# Patient Record
Sex: Female | Born: 1955 | Race: White | Hispanic: No | Marital: Married | Smoking: Never smoker
Health system: Southern US, Community
[De-identification: ages and names within clinical notes are randomized; demographics above are authoritative.]

## PROBLEM LIST (undated history)

## (undated) DIAGNOSIS — D649 Anemia, unspecified: Secondary | ICD-10-CM

## (undated) DIAGNOSIS — J069 Acute upper respiratory infection, unspecified: Secondary | ICD-10-CM

## (undated) DIAGNOSIS — M542 Cervicalgia: Secondary | ICD-10-CM

## (undated) DIAGNOSIS — I739 Peripheral vascular disease, unspecified: Secondary | ICD-10-CM

## (undated) DIAGNOSIS — K5790 Diverticulosis of intestine, part unspecified, without perforation or abscess without bleeding: Secondary | ICD-10-CM

## (undated) DIAGNOSIS — R000 Tachycardia, unspecified: Secondary | ICD-10-CM

## (undated) DIAGNOSIS — Z950 Presence of cardiac pacemaker: Secondary | ICD-10-CM

## (undated) DIAGNOSIS — Z9889 Other specified postprocedural states: Secondary | ICD-10-CM

## (undated) DIAGNOSIS — I498 Other specified cardiac arrhythmias: Secondary | ICD-10-CM

## (undated) DIAGNOSIS — I10 Essential (primary) hypertension: Secondary | ICD-10-CM

## (undated) DIAGNOSIS — T8859XA Other complications of anesthesia, initial encounter: Secondary | ICD-10-CM

## (undated) DIAGNOSIS — K859 Acute pancreatitis without necrosis or infection, unspecified: Secondary | ICD-10-CM

## (undated) DIAGNOSIS — I4891 Unspecified atrial fibrillation: Secondary | ICD-10-CM

## (undated) DIAGNOSIS — I82409 Acute embolism and thrombosis of unspecified deep veins of unspecified lower extremity: Secondary | ICD-10-CM

## (undated) DIAGNOSIS — G909 Disorder of the autonomic nervous system, unspecified: Secondary | ICD-10-CM

## (undated) DIAGNOSIS — R209 Unspecified disturbances of skin sensation: Secondary | ICD-10-CM

## (undated) DIAGNOSIS — I4719 Other supraventricular tachycardia: Secondary | ICD-10-CM

## (undated) DIAGNOSIS — Q078 Other specified congenital malformations of nervous system: Secondary | ICD-10-CM

## (undated) DIAGNOSIS — I639 Cerebral infarction, unspecified: Secondary | ICD-10-CM

## (undated) DIAGNOSIS — R748 Abnormal levels of other serum enzymes: Secondary | ICD-10-CM

## (undated) DIAGNOSIS — R0600 Dyspnea, unspecified: Secondary | ICD-10-CM

## (undated) DIAGNOSIS — C787 Secondary malignant neoplasm of liver and intrahepatic bile duct: Secondary | ICD-10-CM

## (undated) DIAGNOSIS — K219 Gastro-esophageal reflux disease without esophagitis: Secondary | ICD-10-CM

## (undated) DIAGNOSIS — K760 Fatty (change of) liver, not elsewhere classified: Secondary | ICD-10-CM

## (undated) DIAGNOSIS — J189 Pneumonia, unspecified organism: Secondary | ICD-10-CM

## (undated) DIAGNOSIS — E119 Type 2 diabetes mellitus without complications: Secondary | ICD-10-CM

## (undated) DIAGNOSIS — J45909 Unspecified asthma, uncomplicated: Secondary | ICD-10-CM

## (undated) DIAGNOSIS — M1990 Unspecified osteoarthritis, unspecified site: Secondary | ICD-10-CM

## (undated) DIAGNOSIS — E785 Hyperlipidemia, unspecified: Secondary | ICD-10-CM

## (undated) DIAGNOSIS — M949 Disorder of cartilage, unspecified: Secondary | ICD-10-CM

## (undated) DIAGNOSIS — L309 Dermatitis, unspecified: Secondary | ICD-10-CM

## (undated) DIAGNOSIS — S4490XA Injury of unspecified nerve at shoulder and upper arm level, unspecified arm, initial encounter: Secondary | ICD-10-CM

## (undated) DIAGNOSIS — Z8489 Family history of other specified conditions: Secondary | ICD-10-CM

## (undated) DIAGNOSIS — R112 Nausea with vomiting, unspecified: Secondary | ICD-10-CM

## (undated) DIAGNOSIS — I471 Supraventricular tachycardia: Secondary | ICD-10-CM

## (undated) DIAGNOSIS — T783XXA Angioneurotic edema, initial encounter: Secondary | ICD-10-CM

## (undated) DIAGNOSIS — C7800 Secondary malignant neoplasm of unspecified lung: Secondary | ICD-10-CM

## (undated) DIAGNOSIS — M7989 Other specified soft tissue disorders: Secondary | ICD-10-CM

## (undated) DIAGNOSIS — G90A Postural orthostatic tachycardia syndrome (POTS): Secondary | ICD-10-CM

## (undated) DIAGNOSIS — Z923 Personal history of irradiation: Secondary | ICD-10-CM

## (undated) DIAGNOSIS — C7931 Secondary malignant neoplasm of brain: Secondary | ICD-10-CM

## (undated) DIAGNOSIS — T4145XA Adverse effect of unspecified anesthetic, initial encounter: Secondary | ICD-10-CM

## (undated) DIAGNOSIS — L509 Urticaria, unspecified: Secondary | ICD-10-CM

## (undated) DIAGNOSIS — F419 Anxiety disorder, unspecified: Secondary | ICD-10-CM

## (undated) DIAGNOSIS — Z8719 Personal history of other diseases of the digestive system: Secondary | ICD-10-CM

## (undated) DIAGNOSIS — M899 Disorder of bone, unspecified: Secondary | ICD-10-CM

## (undated) DIAGNOSIS — I951 Orthostatic hypotension: Secondary | ICD-10-CM

## (undated) DIAGNOSIS — M8580 Other specified disorders of bone density and structure, unspecified site: Secondary | ICD-10-CM

## (undated) DIAGNOSIS — E162 Hypoglycemia, unspecified: Secondary | ICD-10-CM

## (undated) DIAGNOSIS — I7300 Raynaud's syndrome without gangrene: Secondary | ICD-10-CM

## (undated) DIAGNOSIS — C4490 Unspecified malignant neoplasm of skin, unspecified: Secondary | ICD-10-CM

## (undated) HISTORY — DX: Other supraventricular tachycardia: I47.19

## (undated) HISTORY — PX: CARPAL TUNNEL RELEASE: SHX101

## (undated) HISTORY — DX: Presence of cardiac pacemaker: Z95.0

## (undated) HISTORY — DX: Dermatitis, unspecified: L30.9

## (undated) HISTORY — PX: TONSILLECTOMY AND ADENOIDECTOMY: SUR1326

## (undated) HISTORY — DX: Orthostatic hypotension: I95.1

## (undated) HISTORY — DX: Other specified soft tissue disorders: M79.89

## (undated) HISTORY — DX: Hypoglycemia, unspecified: E16.2

## (undated) HISTORY — DX: Acute embolism and thrombosis of unspecified deep veins of unspecified lower extremity: I82.409

## (undated) HISTORY — DX: Cervicalgia: M54.2

## (undated) HISTORY — DX: Urticaria, unspecified: L50.9

## (undated) HISTORY — DX: Cerebral infarction, unspecified: I63.9

## (undated) HISTORY — DX: Unspecified atrial fibrillation: I48.91

## (undated) HISTORY — DX: Type 2 diabetes mellitus without complications: E11.9

## (undated) HISTORY — PX: MELANOMA EXCISION: SHX5266

## (undated) HISTORY — DX: Unspecified disturbances of skin sensation: R20.9

## (undated) HISTORY — DX: Hyperlipidemia, unspecified: E78.5

## (undated) HISTORY — DX: Abnormal levels of other serum enzymes: R74.8

## (undated) HISTORY — PX: DEEP AXILLARY SENTINEL NODE BIOPSY / EXCISION: SUR130

## (undated) HISTORY — DX: Unspecified malignant neoplasm of skin, unspecified: C44.90

## (undated) HISTORY — DX: Angioneurotic edema, initial encounter: T78.3XXA

## (undated) HISTORY — DX: Disorder of bone, unspecified: M89.9

## (undated) HISTORY — DX: Injury of unspecified nerve at shoulder and upper arm level, unspecified arm, initial encounter: S44.90XA

## (undated) HISTORY — DX: Other specified cardiac arrhythmias: I49.8

## (undated) HISTORY — DX: Other specified disorders of bone density and structure, unspecified site: M85.80

## (undated) HISTORY — DX: Acute upper respiratory infection, unspecified: J06.9

## (undated) HISTORY — DX: Diverticulosis of intestine, part unspecified, without perforation or abscess without bleeding: K57.90

## (undated) HISTORY — DX: Essential (primary) hypertension: I10

## (undated) HISTORY — DX: Supraventricular tachycardia: I47.1

## (undated) HISTORY — PX: SINOSCOPY: SHX187

## (undated) HISTORY — DX: Postural orthostatic tachycardia syndrome (POTS): G90.A

## (undated) HISTORY — PX: ADENOIDECTOMY: SUR15

## (undated) HISTORY — PX: PACEMAKER IMPLANT: EP1218

## (undated) HISTORY — DX: Other specified congenital malformations of nervous system: Q07.8

## (undated) HISTORY — PX: OTHER SURGICAL HISTORY: SHX169

## (undated) HISTORY — DX: Tachycardia, unspecified: R00.0

## (undated) HISTORY — DX: Acute pancreatitis without necrosis or infection, unspecified: K85.90

## (undated) HISTORY — DX: Disorder of cartilage, unspecified: M94.9

## (undated) HISTORY — PX: NOSE SURGERY: SHX723

## (undated) HISTORY — PX: ABLATION SAPHENOUS VEIN W/ RFA: SUR11

---

## 1997-05-27 ENCOUNTER — Encounter: Admission: RE | Admit: 1997-05-27 | Discharge: 1997-08-25 | Payer: Self-pay | Admitting: *Deleted

## 1997-09-25 ENCOUNTER — Ambulatory Visit (HOSPITAL_COMMUNITY): Admission: RE | Admit: 1997-09-25 | Discharge: 1997-09-25 | Payer: Self-pay | Admitting: *Deleted

## 1997-11-26 ENCOUNTER — Other Ambulatory Visit: Admission: RE | Admit: 1997-11-26 | Discharge: 1997-11-26 | Payer: Self-pay | Admitting: Obstetrics and Gynecology

## 1997-12-08 ENCOUNTER — Inpatient Hospital Stay (HOSPITAL_COMMUNITY): Admission: RE | Admit: 1997-12-08 | Discharge: 1997-12-09 | Payer: Self-pay | Admitting: Internal Medicine

## 1998-02-06 ENCOUNTER — Inpatient Hospital Stay (HOSPITAL_COMMUNITY): Admission: EM | Admit: 1998-02-06 | Discharge: 1998-02-07 | Payer: Self-pay | Admitting: Emergency Medicine

## 1998-02-06 ENCOUNTER — Encounter: Payer: Self-pay | Admitting: Emergency Medicine

## 1998-02-26 HISTORY — PX: CARPAL TUNNEL RELEASE: SHX101

## 1998-02-26 HISTORY — PX: OTHER SURGICAL HISTORY: SHX169

## 1998-05-31 ENCOUNTER — Encounter (HOSPITAL_COMMUNITY): Admission: RE | Admit: 1998-05-31 | Discharge: 1998-08-29 | Payer: Self-pay | Admitting: Internal Medicine

## 1998-12-27 ENCOUNTER — Encounter: Payer: Self-pay | Admitting: Internal Medicine

## 1998-12-27 ENCOUNTER — Ambulatory Visit (HOSPITAL_COMMUNITY): Admission: RE | Admit: 1998-12-27 | Discharge: 1998-12-27 | Payer: Self-pay | Admitting: *Deleted

## 1999-03-14 ENCOUNTER — Other Ambulatory Visit: Admission: RE | Admit: 1999-03-14 | Discharge: 1999-03-14 | Payer: Self-pay | Admitting: Obstetrics and Gynecology

## 1999-09-02 ENCOUNTER — Emergency Department (HOSPITAL_COMMUNITY): Admission: EM | Admit: 1999-09-02 | Discharge: 1999-09-02 | Payer: Self-pay | Admitting: Emergency Medicine

## 2000-01-01 ENCOUNTER — Encounter (HOSPITAL_COMMUNITY): Admission: RE | Admit: 2000-01-01 | Discharge: 2000-01-01 | Payer: Self-pay | Admitting: Internal Medicine

## 2000-08-20 ENCOUNTER — Ambulatory Visit (HOSPITAL_COMMUNITY): Admission: RE | Admit: 2000-08-20 | Discharge: 2000-08-20 | Payer: Self-pay | Admitting: Internal Medicine

## 2000-08-20 ENCOUNTER — Encounter: Payer: Self-pay | Admitting: Internal Medicine

## 2001-04-22 ENCOUNTER — Ambulatory Visit (HOSPITAL_COMMUNITY): Admission: RE | Admit: 2001-04-22 | Discharge: 2001-04-22 | Payer: Self-pay | Admitting: Internal Medicine

## 2001-09-06 ENCOUNTER — Emergency Department (HOSPITAL_COMMUNITY): Admission: EM | Admit: 2001-09-06 | Discharge: 2001-09-06 | Payer: Self-pay | Admitting: Emergency Medicine

## 2001-12-09 ENCOUNTER — Inpatient Hospital Stay (HOSPITAL_COMMUNITY): Admission: AD | Admit: 2001-12-09 | Discharge: 2001-12-09 | Payer: Self-pay | Admitting: Internal Medicine

## 2002-02-20 ENCOUNTER — Other Ambulatory Visit: Admission: RE | Admit: 2002-02-20 | Discharge: 2002-02-20 | Payer: Self-pay | Admitting: Obstetrics and Gynecology

## 2002-05-06 ENCOUNTER — Ambulatory Visit (HOSPITAL_COMMUNITY): Admission: RE | Admit: 2002-05-06 | Discharge: 2002-05-06 | Payer: Self-pay | Admitting: Internal Medicine

## 2002-05-06 ENCOUNTER — Encounter: Payer: Self-pay | Admitting: Internal Medicine

## 2002-05-25 ENCOUNTER — Encounter: Payer: Self-pay | Admitting: Internal Medicine

## 2002-05-28 ENCOUNTER — Emergency Department (HOSPITAL_COMMUNITY): Admission: EM | Admit: 2002-05-28 | Discharge: 2002-05-28 | Payer: Self-pay | Admitting: Emergency Medicine

## 2002-12-04 ENCOUNTER — Emergency Department (HOSPITAL_COMMUNITY): Admission: EM | Admit: 2002-12-04 | Discharge: 2002-12-04 | Payer: Self-pay | Admitting: Emergency Medicine

## 2002-12-04 ENCOUNTER — Encounter: Payer: Self-pay | Admitting: Emergency Medicine

## 2003-01-05 ENCOUNTER — Ambulatory Visit (HOSPITAL_COMMUNITY): Admission: RE | Admit: 2003-01-05 | Discharge: 2003-01-05 | Payer: Self-pay | Admitting: *Deleted

## 2003-04-08 ENCOUNTER — Ambulatory Visit (HOSPITAL_COMMUNITY): Admission: RE | Admit: 2003-04-08 | Discharge: 2003-04-08 | Payer: Self-pay | Admitting: Internal Medicine

## 2003-05-04 ENCOUNTER — Ambulatory Visit (HOSPITAL_COMMUNITY): Admission: RE | Admit: 2003-05-04 | Discharge: 2003-05-04 | Payer: Self-pay | Admitting: Internal Medicine

## 2003-05-10 ENCOUNTER — Ambulatory Visit (HOSPITAL_COMMUNITY): Admission: RE | Admit: 2003-05-10 | Discharge: 2003-05-10 | Payer: Self-pay | Admitting: Internal Medicine

## 2003-11-09 ENCOUNTER — Ambulatory Visit (HOSPITAL_COMMUNITY): Admission: RE | Admit: 2003-11-09 | Discharge: 2003-11-09 | Payer: Self-pay | Admitting: Internal Medicine

## 2003-12-14 ENCOUNTER — Ambulatory Visit: Payer: Self-pay | Admitting: *Deleted

## 2004-02-04 ENCOUNTER — Ambulatory Visit: Payer: Self-pay | Admitting: Internal Medicine

## 2004-03-10 ENCOUNTER — Ambulatory Visit: Payer: Self-pay

## 2004-03-22 ENCOUNTER — Ambulatory Visit: Payer: Self-pay | Admitting: Internal Medicine

## 2004-03-26 ENCOUNTER — Emergency Department (HOSPITAL_COMMUNITY): Admission: EM | Admit: 2004-03-26 | Discharge: 2004-03-26 | Payer: Self-pay | Admitting: Emergency Medicine

## 2004-03-27 ENCOUNTER — Ambulatory Visit: Payer: Self-pay | Admitting: Internal Medicine

## 2004-05-02 ENCOUNTER — Ambulatory Visit: Payer: Self-pay | Admitting: Internal Medicine

## 2004-05-16 ENCOUNTER — Ambulatory Visit: Payer: Self-pay | Admitting: Internal Medicine

## 2004-06-14 ENCOUNTER — Ambulatory Visit: Payer: Self-pay | Admitting: Internal Medicine

## 2004-06-29 ENCOUNTER — Ambulatory Visit: Payer: Self-pay | Admitting: Internal Medicine

## 2004-07-12 ENCOUNTER — Ambulatory Visit: Payer: Self-pay

## 2004-07-27 ENCOUNTER — Ambulatory Visit: Payer: Self-pay | Admitting: Internal Medicine

## 2004-07-28 ENCOUNTER — Ambulatory Visit: Payer: Self-pay

## 2004-08-03 ENCOUNTER — Ambulatory Visit: Payer: Self-pay | Admitting: Cardiology

## 2004-08-10 ENCOUNTER — Ambulatory Visit: Payer: Self-pay | Admitting: Internal Medicine

## 2004-08-17 ENCOUNTER — Ambulatory Visit: Payer: Self-pay | Admitting: Cardiology

## 2004-08-24 ENCOUNTER — Ambulatory Visit: Payer: Self-pay | Admitting: Cardiology

## 2004-09-01 ENCOUNTER — Ambulatory Visit: Payer: Self-pay | Admitting: Cardiology

## 2004-09-08 ENCOUNTER — Ambulatory Visit: Payer: Self-pay | Admitting: Cardiology

## 2004-09-15 ENCOUNTER — Ambulatory Visit: Payer: Self-pay | Admitting: Cardiology

## 2004-09-22 ENCOUNTER — Ambulatory Visit: Payer: Self-pay

## 2004-09-22 ENCOUNTER — Ambulatory Visit: Payer: Self-pay | Admitting: Cardiology

## 2004-10-06 ENCOUNTER — Ambulatory Visit: Payer: Self-pay | Admitting: Cardiology

## 2004-10-13 ENCOUNTER — Ambulatory Visit: Payer: Self-pay | Admitting: Internal Medicine

## 2004-10-27 ENCOUNTER — Ambulatory Visit: Payer: Self-pay | Admitting: Cardiology

## 2004-10-27 ENCOUNTER — Ambulatory Visit: Payer: Self-pay | Admitting: Internal Medicine

## 2004-11-10 ENCOUNTER — Ambulatory Visit: Payer: Self-pay | Admitting: Cardiology

## 2004-11-15 ENCOUNTER — Ambulatory Visit: Payer: Self-pay | Admitting: Cardiology

## 2004-11-24 ENCOUNTER — Ambulatory Visit: Payer: Self-pay | Admitting: Cardiology

## 2004-12-22 ENCOUNTER — Ambulatory Visit: Payer: Self-pay | Admitting: Cardiology

## 2004-12-28 ENCOUNTER — Ambulatory Visit: Payer: Self-pay | Admitting: Cardiology

## 2005-01-17 ENCOUNTER — Ambulatory Visit: Payer: Self-pay | Admitting: Internal Medicine

## 2005-01-25 ENCOUNTER — Ambulatory Visit: Payer: Self-pay | Admitting: Internal Medicine

## 2005-02-01 ENCOUNTER — Ambulatory Visit: Payer: Self-pay | Admitting: Gastroenterology

## 2005-02-13 ENCOUNTER — Ambulatory Visit (HOSPITAL_COMMUNITY): Admission: RE | Admit: 2005-02-13 | Discharge: 2005-02-13 | Payer: Self-pay | Admitting: Gastroenterology

## 2005-02-13 ENCOUNTER — Encounter (INDEPENDENT_AMBULATORY_CARE_PROVIDER_SITE_OTHER): Payer: Self-pay | Admitting: *Deleted

## 2005-02-28 ENCOUNTER — Ambulatory Visit: Payer: Self-pay | Admitting: Gastroenterology

## 2005-03-09 ENCOUNTER — Ambulatory Visit: Payer: Self-pay

## 2005-07-16 ENCOUNTER — Ambulatory Visit: Payer: Self-pay | Admitting: Internal Medicine

## 2005-08-23 ENCOUNTER — Ambulatory Visit: Payer: Self-pay

## 2005-10-31 ENCOUNTER — Ambulatory Visit: Payer: Self-pay

## 2005-12-05 ENCOUNTER — Ambulatory Visit: Payer: Self-pay | Admitting: Internal Medicine

## 2005-12-05 LAB — CONVERTED CEMR LAB
ALT: 29 units/L (ref 0–40)
AST: 22 units/L (ref 0–37)
Albumin: 4 g/dL (ref 3.5–5.2)
Alkaline Phosphatase: 116 units/L (ref 39–117)
BUN: 7 mg/dL (ref 6–23)
Basophils Absolute: 0 10*3/uL (ref 0.0–0.1)
Basophils Relative: 0.4 % (ref 0.0–1.0)
Bilirubin, Direct: 0.1 mg/dL (ref 0.0–0.3)
CO2: 29 meq/L (ref 19–32)
Calcium: 9.7 mg/dL (ref 8.4–10.5)
Chloride: 107 meq/L (ref 96–112)
Creatinine, Ser: 0.8 mg/dL (ref 0.4–1.2)
Eosinophil percent: 0.6 % (ref 0.0–5.0)
GFR calc non Af Amer: 82 mL/min
Glomerular Filtration Rate, Af Am: 99 mL/min/{1.73_m2}
Glucose, Bld: 123 mg/dL — ABNORMAL HIGH (ref 70–99)
HCT: 40.5 % (ref 36.0–46.0)
Hemoglobin: 13.5 g/dL (ref 12.0–15.0)
Lymphocytes Relative: 14.2 % (ref 12.0–46.0)
MCHC: 33.4 g/dL (ref 30.0–36.0)
MCV: 88.3 fL (ref 78.0–100.0)
Monocytes Absolute: 0.7 10*3/uL (ref 0.2–0.7)
Monocytes Relative: 8.7 % (ref 3.0–11.0)
Neutro Abs: 6.4 10*3/uL (ref 1.4–7.7)
Neutrophils Relative %: 76.1 % (ref 43.0–77.0)
Platelets: 276 10*3/uL (ref 150–400)
Potassium: 4.9 meq/L (ref 3.5–5.1)
RBC: 4.58 M/uL (ref 3.87–5.11)
RDW: 11.7 % (ref 11.5–14.6)
Sodium: 141 meq/L (ref 135–145)
Total Bilirubin: 0.6 mg/dL (ref 0.3–1.2)
Total Protein: 6.8 g/dL (ref 6.0–8.3)
WBC: 8.3 10*3/uL (ref 4.5–10.5)

## 2005-12-14 ENCOUNTER — Encounter (INDEPENDENT_AMBULATORY_CARE_PROVIDER_SITE_OTHER): Payer: Self-pay | Admitting: *Deleted

## 2005-12-14 ENCOUNTER — Ambulatory Visit (HOSPITAL_COMMUNITY): Admission: RE | Admit: 2005-12-14 | Discharge: 2005-12-14 | Payer: Self-pay | Admitting: Internal Medicine

## 2005-12-18 ENCOUNTER — Ambulatory Visit: Payer: Self-pay | Admitting: Internal Medicine

## 2005-12-25 ENCOUNTER — Ambulatory Visit: Payer: Self-pay | Admitting: Internal Medicine

## 2006-01-02 ENCOUNTER — Ambulatory Visit: Payer: Self-pay | Admitting: Internal Medicine

## 2006-01-02 ENCOUNTER — Ambulatory Visit: Payer: Self-pay

## 2006-01-03 ENCOUNTER — Ambulatory Visit: Payer: Self-pay | Admitting: Internal Medicine

## 2006-01-03 LAB — CONVERTED CEMR LAB
Glucose, Bld: 84 mg/dL (ref 70–99)
Hgb A1c MFr Bld: 5.5 % (ref 4.6–6.0)

## 2006-01-09 ENCOUNTER — Ambulatory Visit: Payer: Self-pay | Admitting: Internal Medicine

## 2006-05-09 ENCOUNTER — Ambulatory Visit: Payer: Self-pay | Admitting: Internal Medicine

## 2006-06-10 ENCOUNTER — Ambulatory Visit: Payer: Self-pay | Admitting: Internal Medicine

## 2006-07-29 ENCOUNTER — Ambulatory Visit: Payer: Self-pay | Admitting: Internal Medicine

## 2006-08-12 ENCOUNTER — Inpatient Hospital Stay (HOSPITAL_COMMUNITY): Admission: AD | Admit: 2006-08-12 | Discharge: 2006-08-13 | Payer: Self-pay | Admitting: Internal Medicine

## 2006-08-13 ENCOUNTER — Encounter: Payer: Self-pay | Admitting: Internal Medicine

## 2006-08-19 ENCOUNTER — Ambulatory Visit: Payer: Self-pay | Admitting: Internal Medicine

## 2006-08-19 ENCOUNTER — Encounter (INDEPENDENT_AMBULATORY_CARE_PROVIDER_SITE_OTHER): Payer: Self-pay | Admitting: Surgery

## 2006-08-19 ENCOUNTER — Ambulatory Visit (HOSPITAL_COMMUNITY): Admission: RE | Admit: 2006-08-19 | Discharge: 2006-08-19 | Payer: Self-pay | Admitting: Surgery

## 2006-08-26 ENCOUNTER — Encounter (INDEPENDENT_AMBULATORY_CARE_PROVIDER_SITE_OTHER): Payer: Self-pay | Admitting: *Deleted

## 2006-11-08 ENCOUNTER — Encounter: Admission: RE | Admit: 2006-11-08 | Discharge: 2006-11-08 | Payer: Self-pay | Admitting: *Deleted

## 2006-11-08 ENCOUNTER — Encounter (INDEPENDENT_AMBULATORY_CARE_PROVIDER_SITE_OTHER): Payer: Self-pay | Admitting: *Deleted

## 2006-11-08 ENCOUNTER — Ambulatory Visit: Payer: Self-pay | Admitting: Internal Medicine

## 2006-11-26 ENCOUNTER — Ambulatory Visit: Payer: Self-pay | Admitting: Internal Medicine

## 2006-11-26 LAB — CONVERTED CEMR LAB
BUN: 12 mg/dL (ref 6–23)
Basophils Absolute: 0 10*3/uL (ref 0.0–0.1)
Basophils Relative: 1 % (ref 0.0–1.0)
CO2: 30 meq/L (ref 19–32)
Calcium: 9.4 mg/dL (ref 8.4–10.5)
Chloride: 103 meq/L (ref 96–112)
Cholesterol: 212 mg/dL (ref 0–200)
Creatinine, Ser: 0.7 mg/dL (ref 0.4–1.2)
Direct LDL: 138.4 mg/dL
Eosinophils Absolute: 0.1 10*3/uL (ref 0.0–0.6)
Eosinophils Relative: 2.3 % (ref 0.0–5.0)
GFR calc Af Amer: 115 mL/min
GFR calc non Af Amer: 95 mL/min
Glucose, Bld: 94 mg/dL (ref 70–99)
HCT: 38.2 % (ref 36.0–46.0)
HDL: 52.8 mg/dL (ref >39.0)
Hemoglobin: 13.3 g/dL (ref 12.0–15.0)
Lymphocytes Relative: 39.1 % (ref 12.0–46.0)
MCHC: 34.9 g/dL (ref 30.0–36.0)
MCV: 87.9 fL (ref 78.0–100.0)
Monocytes Absolute: 0.6 10*3/uL (ref 0.2–0.7)
Monocytes Relative: 13.2 % — ABNORMAL HIGH (ref 3.0–11.0)
Neutro Abs: 2.2 10*3/uL (ref 1.4–7.7)
Neutrophils Relative %: 44.4 % (ref 43.0–77.0)
Platelets: 271 10*3/uL (ref 150–400)
Potassium: 4.1 meq/L (ref 3.5–5.1)
RBC: 4.35 M/uL (ref 3.87–5.11)
RDW: 11.6 % (ref 11.5–14.6)
Sodium: 140 meq/L (ref 135–145)
Total CHOL/HDL Ratio: 4
Triglycerides: 110 mg/dL (ref 0–149)
VLDL: 22 mg/dL (ref 0–40)
WBC: 4.7 10*3/uL (ref 4.5–10.5)

## 2007-01-05 ENCOUNTER — Emergency Department (HOSPITAL_COMMUNITY): Admission: EM | Admit: 2007-01-05 | Discharge: 2007-01-05 | Payer: Self-pay | Admitting: Emergency Medicine

## 2007-01-06 ENCOUNTER — Encounter: Payer: Self-pay | Admitting: Internal Medicine

## 2007-01-09 ENCOUNTER — Ambulatory Visit (HOSPITAL_COMMUNITY): Admission: RE | Admit: 2007-01-09 | Discharge: 2007-01-09 | Payer: Self-pay | Admitting: Otolaryngology

## 2007-02-05 ENCOUNTER — Ambulatory Visit: Payer: Self-pay | Admitting: Internal Medicine

## 2007-03-10 ENCOUNTER — Ambulatory Visit: Payer: Self-pay

## 2007-04-11 ENCOUNTER — Ambulatory Visit: Payer: Self-pay | Admitting: Internal Medicine

## 2007-05-01 ENCOUNTER — Ambulatory Visit: Payer: Self-pay | Admitting: Internal Medicine

## 2007-05-01 LAB — CONVERTED CEMR LAB
ALT: 21 units/L (ref 0–35)
AST: 28 units/L (ref 0–37)
BUN: 12 mg/dL (ref 6–23)
CO2: 26 meq/L (ref 19–32)
Calcium: 9.3 mg/dL (ref 8.4–10.5)
Chloride: 103 meq/L (ref 96–112)
Cholesterol: 177 mg/dL (ref 0–200)
Creatinine, Ser: 0.6 mg/dL (ref 0.4–1.2)
GFR calc Af Amer: 137 mL/min
GFR calc non Af Amer: 113 mL/min
Glucose, Bld: 92 mg/dL (ref 70–99)
HDL: 56 mg/dL (ref >39.0)
LDL Cholesterol: 99 mg/dL (ref 0–99)
Potassium: 4.5 meq/L (ref 3.5–5.1)
Sodium: 138 meq/L (ref 135–145)
TSH: 0.82 microintl units/mL (ref 0.35–5.50)
Total CHOL/HDL Ratio: 3.2
Triglycerides: 109 mg/dL (ref 0–149)
VLDL: 22 mg/dL (ref 0–40)

## 2007-06-10 ENCOUNTER — Encounter: Payer: Self-pay | Admitting: Internal Medicine

## 2007-06-20 ENCOUNTER — Ambulatory Visit (HOSPITAL_COMMUNITY): Admission: RE | Admit: 2007-06-20 | Discharge: 2007-06-20 | Payer: Self-pay | Admitting: Otolaryngology

## 2007-06-25 ENCOUNTER — Ambulatory Visit: Payer: Self-pay | Admitting: Internal Medicine

## 2007-06-25 ENCOUNTER — Ambulatory Visit: Payer: Self-pay

## 2007-06-25 DIAGNOSIS — M79609 Pain in unspecified limb: Secondary | ICD-10-CM | POA: Insufficient documentation

## 2007-06-25 DIAGNOSIS — M949 Disorder of cartilage, unspecified: Secondary | ICD-10-CM

## 2007-06-25 DIAGNOSIS — M899 Disorder of bone, unspecified: Secondary | ICD-10-CM | POA: Insufficient documentation

## 2007-06-25 DIAGNOSIS — M7989 Other specified soft tissue disorders: Secondary | ICD-10-CM | POA: Insufficient documentation

## 2007-07-14 ENCOUNTER — Ambulatory Visit (HOSPITAL_COMMUNITY): Admission: RE | Admit: 2007-07-14 | Discharge: 2007-07-14 | Payer: Self-pay | Admitting: Otolaryngology

## 2007-09-04 ENCOUNTER — Ambulatory Visit: Payer: Self-pay

## 2007-09-17 ENCOUNTER — Emergency Department (HOSPITAL_COMMUNITY): Admission: EM | Admit: 2007-09-17 | Discharge: 2007-09-17 | Payer: Self-pay | Admitting: Emergency Medicine

## 2007-09-17 ENCOUNTER — Telehealth: Payer: Self-pay | Admitting: Internal Medicine

## 2007-09-22 ENCOUNTER — Telehealth: Payer: Self-pay | Admitting: Internal Medicine

## 2007-09-26 ENCOUNTER — Ambulatory Visit: Payer: Self-pay | Admitting: Internal Medicine

## 2007-09-26 DIAGNOSIS — R748 Abnormal levels of other serum enzymes: Secondary | ICD-10-CM | POA: Insufficient documentation

## 2007-09-26 DIAGNOSIS — M542 Cervicalgia: Secondary | ICD-10-CM | POA: Insufficient documentation

## 2007-09-26 DIAGNOSIS — R209 Unspecified disturbances of skin sensation: Secondary | ICD-10-CM | POA: Insufficient documentation

## 2007-10-07 ENCOUNTER — Encounter: Payer: Self-pay | Admitting: Internal Medicine

## 2007-10-08 ENCOUNTER — Encounter: Payer: Self-pay | Admitting: Internal Medicine

## 2007-10-17 ENCOUNTER — Encounter: Payer: Self-pay | Admitting: Internal Medicine

## 2007-10-17 ENCOUNTER — Encounter: Admission: RE | Admit: 2007-10-17 | Discharge: 2007-10-17 | Payer: Self-pay | Admitting: Endocrinology

## 2007-10-30 ENCOUNTER — Ambulatory Visit: Payer: Self-pay | Admitting: Internal Medicine

## 2007-12-01 ENCOUNTER — Ambulatory Visit: Payer: Self-pay | Admitting: Internal Medicine

## 2007-12-03 ENCOUNTER — Telehealth: Payer: Self-pay | Admitting: Internal Medicine

## 2007-12-08 ENCOUNTER — Telehealth: Payer: Self-pay | Admitting: Internal Medicine

## 2007-12-08 ENCOUNTER — Ambulatory Visit: Payer: Self-pay | Admitting: Internal Medicine

## 2007-12-08 DIAGNOSIS — R1012 Left upper quadrant pain: Secondary | ICD-10-CM | POA: Insufficient documentation

## 2007-12-08 DIAGNOSIS — R1032 Left lower quadrant pain: Secondary | ICD-10-CM | POA: Insufficient documentation

## 2007-12-08 LAB — CONVERTED CEMR LAB
BUN: 9 mg/dL (ref 6–23)
Basophils Absolute: 0.1 10*3/uL (ref 0.0–0.1)
Basophils Relative: 0.5 % (ref 0.0–3.0)
CO2: 28 meq/L (ref 19–32)
Calcium: 9.5 mg/dL (ref 8.4–10.5)
Chloride: 105 meq/L (ref 96–112)
Creatinine, Ser: 0.6 mg/dL (ref 0.4–1.2)
Eosinophils Absolute: 0 10*3/uL (ref 0.0–0.7)
Eosinophils Relative: 0.3 % (ref 0.0–5.0)
GFR calc Af Amer: 137 mL/min
GFR calc non Af Amer: 113 mL/min
Glucose, Bld: 106 mg/dL — ABNORMAL HIGH (ref 70–99)
HCT: 40.3 % (ref 36.0–46.0)
Hemoglobin: 14.5 g/dL (ref 12.0–15.0)
Lymphocytes Relative: 10.7 % — ABNORMAL LOW (ref 12.0–46.0)
MCHC: 35.9 g/dL (ref 30.0–36.0)
MCV: 87.1 fL (ref 78.0–100.0)
Monocytes Absolute: 0.9 10*3/uL (ref 0.1–1.0)
Monocytes Relative: 7.8 % (ref 3.0–12.0)
Neutro Abs: 8.7 10*3/uL — ABNORMAL HIGH (ref 1.4–7.7)
Neutrophils Relative %: 80.7 % — ABNORMAL HIGH (ref 43.0–77.0)
Platelets: 264 10*3/uL (ref 150–400)
Potassium: 4 meq/L (ref 3.5–5.1)
RBC: 4.63 M/uL (ref 3.87–5.11)
RDW: 11.1 % — ABNORMAL LOW (ref 11.5–14.6)
Sodium: 142 meq/L (ref 135–145)
WBC: 10.9 10*3/uL — ABNORMAL HIGH (ref 4.5–10.5)

## 2007-12-10 ENCOUNTER — Telehealth: Payer: Self-pay | Admitting: Internal Medicine

## 2007-12-10 ENCOUNTER — Ambulatory Visit (HOSPITAL_COMMUNITY): Admission: RE | Admit: 2007-12-10 | Discharge: 2007-12-10 | Payer: Self-pay | Admitting: Internal Medicine

## 2007-12-15 ENCOUNTER — Telehealth: Payer: Self-pay | Admitting: Internal Medicine

## 2008-01-14 ENCOUNTER — Encounter: Payer: Self-pay | Admitting: Internal Medicine

## 2008-02-05 ENCOUNTER — Encounter: Payer: Self-pay | Admitting: Internal Medicine

## 2008-02-12 ENCOUNTER — Ambulatory Visit (HOSPITAL_COMMUNITY): Admission: RE | Admit: 2008-02-12 | Discharge: 2008-02-12 | Payer: Self-pay | Admitting: Surgery

## 2008-02-16 ENCOUNTER — Ambulatory Visit (HOSPITAL_COMMUNITY): Admission: RE | Admit: 2008-02-16 | Discharge: 2008-02-16 | Payer: Self-pay | Admitting: Surgery

## 2008-02-24 ENCOUNTER — Encounter: Admission: RE | Admit: 2008-02-24 | Discharge: 2008-02-24 | Payer: Self-pay | Admitting: Endocrinology

## 2008-03-01 ENCOUNTER — Ambulatory Visit: Payer: Self-pay | Admitting: Oncology

## 2008-04-22 ENCOUNTER — Encounter: Payer: Self-pay | Admitting: Internal Medicine

## 2008-05-04 ENCOUNTER — Encounter: Payer: Self-pay | Admitting: Internal Medicine

## 2008-05-11 ENCOUNTER — Ambulatory Visit (HOSPITAL_COMMUNITY): Admission: RE | Admit: 2008-05-11 | Discharge: 2008-05-11 | Payer: Self-pay | Admitting: Otolaryngology

## 2008-06-17 ENCOUNTER — Encounter (INDEPENDENT_AMBULATORY_CARE_PROVIDER_SITE_OTHER): Payer: Self-pay | Admitting: *Deleted

## 2008-07-09 ENCOUNTER — Encounter (INDEPENDENT_AMBULATORY_CARE_PROVIDER_SITE_OTHER): Payer: Self-pay | Admitting: *Deleted

## 2008-07-12 ENCOUNTER — Ambulatory Visit: Payer: Self-pay | Admitting: Internal Medicine

## 2008-07-12 DIAGNOSIS — Z85820 Personal history of malignant melanoma of skin: Secondary | ICD-10-CM | POA: Insufficient documentation

## 2008-07-12 DIAGNOSIS — S4490XA Injury of unspecified nerve at shoulder and upper arm level, unspecified arm, initial encounter: Secondary | ICD-10-CM | POA: Insufficient documentation

## 2008-07-13 ENCOUNTER — Ambulatory Visit: Payer: Self-pay

## 2008-07-13 ENCOUNTER — Encounter: Payer: Self-pay | Admitting: Internal Medicine

## 2008-07-13 ENCOUNTER — Telehealth (INDEPENDENT_AMBULATORY_CARE_PROVIDER_SITE_OTHER): Payer: Self-pay | Admitting: *Deleted

## 2008-07-16 ENCOUNTER — Encounter: Payer: Self-pay | Admitting: Internal Medicine

## 2008-07-30 ENCOUNTER — Ambulatory Visit: Payer: Self-pay | Admitting: Internal Medicine

## 2008-08-03 ENCOUNTER — Encounter: Payer: Self-pay | Admitting: Internal Medicine

## 2008-08-09 DIAGNOSIS — Z8669 Personal history of other diseases of the nervous system and sense organs: Secondary | ICD-10-CM | POA: Insufficient documentation

## 2008-08-13 ENCOUNTER — Ambulatory Visit: Payer: Self-pay | Admitting: Internal Medicine

## 2008-09-02 ENCOUNTER — Encounter: Payer: Self-pay | Admitting: Internal Medicine

## 2008-09-09 ENCOUNTER — Telehealth (INDEPENDENT_AMBULATORY_CARE_PROVIDER_SITE_OTHER): Payer: Self-pay | Admitting: *Deleted

## 2008-09-13 ENCOUNTER — Telehealth: Payer: Self-pay | Admitting: Internal Medicine

## 2008-09-13 ENCOUNTER — Encounter: Payer: Self-pay | Admitting: Internal Medicine

## 2008-09-16 ENCOUNTER — Ambulatory Visit: Payer: Self-pay | Admitting: Internal Medicine

## 2008-09-16 ENCOUNTER — Encounter: Payer: Self-pay | Admitting: Internal Medicine

## 2008-09-16 DIAGNOSIS — I498 Other specified cardiac arrhythmias: Secondary | ICD-10-CM | POA: Insufficient documentation

## 2008-09-30 ENCOUNTER — Telehealth (INDEPENDENT_AMBULATORY_CARE_PROVIDER_SITE_OTHER): Payer: Self-pay | Admitting: *Deleted

## 2008-10-01 ENCOUNTER — Encounter: Payer: Self-pay | Admitting: Internal Medicine

## 2008-10-08 ENCOUNTER — Encounter: Payer: Self-pay | Admitting: Internal Medicine

## 2008-10-12 ENCOUNTER — Telehealth: Payer: Self-pay | Admitting: Internal Medicine

## 2008-10-19 ENCOUNTER — Telehealth (INDEPENDENT_AMBULATORY_CARE_PROVIDER_SITE_OTHER): Payer: Self-pay | Admitting: *Deleted

## 2008-10-21 ENCOUNTER — Telehealth: Payer: Self-pay | Admitting: Internal Medicine

## 2008-11-02 ENCOUNTER — Ambulatory Visit: Payer: Self-pay | Admitting: Internal Medicine

## 2008-11-02 DIAGNOSIS — I10 Essential (primary) hypertension: Secondary | ICD-10-CM | POA: Insufficient documentation

## 2008-11-17 ENCOUNTER — Ambulatory Visit: Payer: Self-pay | Admitting: Internal Medicine

## 2008-11-17 DIAGNOSIS — G9001 Carotid sinus syncope: Secondary | ICD-10-CM | POA: Insufficient documentation

## 2008-11-17 DIAGNOSIS — R5381 Other malaise: Secondary | ICD-10-CM | POA: Insufficient documentation

## 2008-11-17 DIAGNOSIS — R5383 Other fatigue: Secondary | ICD-10-CM

## 2008-11-19 LAB — CONVERTED CEMR LAB
BUN: 15 mg/dL (ref 6–23)
CO2: 28 meq/L (ref 19–32)
Calcium: 9.4 mg/dL (ref 8.4–10.5)
Chloride: 106 meq/L (ref 96–112)
Creatinine, Ser: 0.9 mg/dL (ref 0.4–1.2)
GFR calc non Af Amer: 70.19 mL/min (ref >60)
Glucose, Bld: 108 mg/dL — ABNORMAL HIGH (ref 70–99)
Potassium: 3.7 meq/L (ref 3.5–5.1)
Sodium: 141 meq/L (ref 135–145)
Vit D, 25-Hydroxy: 24 ng/mL — ABNORMAL LOW (ref 30–89)
Vitamin B-12: 340 pg/mL (ref 211–911)

## 2008-11-25 ENCOUNTER — Encounter: Admission: RE | Admit: 2008-11-25 | Discharge: 2008-11-25 | Payer: Self-pay | Admitting: Internal Medicine

## 2008-12-03 ENCOUNTER — Telehealth (INDEPENDENT_AMBULATORY_CARE_PROVIDER_SITE_OTHER): Payer: Self-pay | Admitting: *Deleted

## 2008-12-12 ENCOUNTER — Encounter: Payer: Self-pay | Admitting: Internal Medicine

## 2008-12-15 ENCOUNTER — Encounter (INDEPENDENT_AMBULATORY_CARE_PROVIDER_SITE_OTHER): Payer: Self-pay | Admitting: *Deleted

## 2009-01-19 ENCOUNTER — Ambulatory Visit (HOSPITAL_COMMUNITY): Admission: RE | Admit: 2009-01-19 | Discharge: 2009-01-19 | Payer: Self-pay | Admitting: Internal Medicine

## 2009-01-19 ENCOUNTER — Ambulatory Visit: Payer: Self-pay | Admitting: Internal Medicine

## 2009-01-19 DIAGNOSIS — R1031 Right lower quadrant pain: Secondary | ICD-10-CM | POA: Insufficient documentation

## 2009-01-19 DIAGNOSIS — Z8719 Personal history of other diseases of the digestive system: Secondary | ICD-10-CM | POA: Insufficient documentation

## 2009-01-19 LAB — CONVERTED CEMR LAB
Bilirubin Urine: NEGATIVE
Blood in Urine, dipstick: NEGATIVE
Glucose, Urine, Semiquant: NEGATIVE
Ketones, urine, test strip: NEGATIVE
Nitrite: NEGATIVE
Protein, U semiquant: NEGATIVE
Specific Gravity, Urine: 1.02
Urobilinogen, UA: 0.2
pH: 6.5

## 2009-01-26 ENCOUNTER — Telehealth: Payer: Self-pay | Admitting: *Deleted

## 2009-01-27 ENCOUNTER — Telehealth: Payer: Self-pay | Admitting: Internal Medicine

## 2009-01-28 ENCOUNTER — Encounter: Payer: Self-pay | Admitting: Internal Medicine

## 2009-02-01 ENCOUNTER — Ambulatory Visit: Payer: Self-pay | Admitting: Internal Medicine

## 2009-02-28 ENCOUNTER — Telehealth: Payer: Self-pay | Admitting: Internal Medicine

## 2009-03-17 ENCOUNTER — Encounter: Payer: Self-pay | Admitting: Internal Medicine

## 2009-03-23 ENCOUNTER — Telehealth: Payer: Self-pay | Admitting: Internal Medicine

## 2009-03-24 ENCOUNTER — Ambulatory Visit: Payer: Self-pay | Admitting: Internal Medicine

## 2009-03-31 ENCOUNTER — Telehealth: Payer: Self-pay | Admitting: Internal Medicine

## 2009-04-04 ENCOUNTER — Encounter: Payer: Self-pay | Admitting: Internal Medicine

## 2009-04-08 ENCOUNTER — Encounter: Payer: Self-pay | Admitting: Internal Medicine

## 2009-04-08 ENCOUNTER — Ambulatory Visit: Payer: Self-pay | Admitting: Internal Medicine

## 2009-04-20 ENCOUNTER — Ambulatory Visit: Payer: Self-pay | Admitting: Internal Medicine

## 2009-05-02 ENCOUNTER — Encounter: Payer: Self-pay | Admitting: Internal Medicine

## 2009-05-25 ENCOUNTER — Encounter: Payer: Self-pay | Admitting: Internal Medicine

## 2009-05-25 ENCOUNTER — Ambulatory Visit: Payer: Self-pay

## 2009-05-27 ENCOUNTER — Telehealth: Payer: Self-pay | Admitting: Internal Medicine

## 2009-05-30 ENCOUNTER — Encounter: Payer: Self-pay | Admitting: Internal Medicine

## 2009-06-09 ENCOUNTER — Encounter: Payer: Self-pay | Admitting: Internal Medicine

## 2009-07-27 ENCOUNTER — Encounter: Payer: Self-pay | Admitting: Internal Medicine

## 2009-08-01 ENCOUNTER — Encounter: Payer: Self-pay | Admitting: Internal Medicine

## 2009-08-10 ENCOUNTER — Encounter: Payer: Self-pay | Admitting: Internal Medicine

## 2009-08-12 ENCOUNTER — Encounter (INDEPENDENT_AMBULATORY_CARE_PROVIDER_SITE_OTHER): Payer: Self-pay | Admitting: *Deleted

## 2009-08-22 ENCOUNTER — Encounter: Payer: Self-pay | Admitting: Internal Medicine

## 2009-09-25 ENCOUNTER — Encounter: Payer: Self-pay | Admitting: Internal Medicine

## 2009-09-26 ENCOUNTER — Encounter: Payer: Self-pay | Admitting: Internal Medicine

## 2009-10-04 ENCOUNTER — Telehealth: Payer: Self-pay | Admitting: Internal Medicine

## 2009-10-17 ENCOUNTER — Ambulatory Visit: Payer: Self-pay | Admitting: Internal Medicine

## 2009-10-24 ENCOUNTER — Ambulatory Visit: Payer: Self-pay | Admitting: Internal Medicine

## 2009-10-26 LAB — CONVERTED CEMR LAB
BUN: 16 mg/dL (ref 6–23)
Basophils Absolute: 0.1 10*3/uL (ref 0.0–0.1)
Basophils Relative: 1.1 % (ref 0.0–3.0)
CO2: 27 meq/L (ref 19–32)
Calcium: 9.5 mg/dL (ref 8.4–10.5)
Chloride: 106 meq/L (ref 96–112)
Creatinine, Ser: 0.6 mg/dL (ref 0.4–1.2)
Eosinophils Absolute: 0.2 10*3/uL (ref 0.0–0.7)
Eosinophils Relative: 2.5 % (ref 0.0–5.0)
GFR calc non Af Amer: 107.51 mL/min (ref >60)
Glucose, Bld: 88 mg/dL (ref 70–99)
HCT: 40.2 % (ref 36.0–46.0)
Hemoglobin: 14.1 g/dL (ref 12.0–15.0)
Lymphocytes Relative: 31.2 % (ref 12.0–46.0)
Lymphs Abs: 1.9 10*3/uL (ref 0.7–4.0)
MCHC: 35.2 g/dL (ref 30.0–36.0)
MCV: 88.7 fL (ref 78.0–100.0)
Monocytes Absolute: 0.7 10*3/uL (ref 0.1–1.0)
Monocytes Relative: 12 % (ref 3.0–12.0)
Neutro Abs: 3.2 10*3/uL (ref 1.4–7.7)
Neutrophils Relative %: 53.2 % (ref 43.0–77.0)
Platelets: 260 10*3/uL (ref 150.0–400.0)
Potassium: 4.2 meq/L (ref 3.5–5.1)
RBC: 4.53 M/uL (ref 3.87–5.11)
RDW: 12.3 % (ref 11.5–14.6)
Sed Rate: 9 mm/hr (ref 0–22)
Sodium: 142 meq/L (ref 135–145)
Total CK: 144 units/L (ref 7–177)
Vitamin B-12: 522 pg/mL (ref 211–911)
WBC: 6 10*3/uL (ref 4.5–10.5)

## 2009-11-01 ENCOUNTER — Ambulatory Visit: Payer: Self-pay | Admitting: Internal Medicine

## 2009-11-01 LAB — CONVERTED CEMR LAB
Anti Nuclear Antibody(ANA): NEGATIVE
Vit D, 25-Hydroxy: 24 ng/mL — ABNORMAL LOW (ref 30–89)

## 2009-11-11 ENCOUNTER — Telehealth: Payer: Self-pay | Admitting: Internal Medicine

## 2009-11-18 ENCOUNTER — Ambulatory Visit: Payer: Self-pay | Admitting: Internal Medicine

## 2009-11-21 ENCOUNTER — Encounter: Payer: Self-pay | Admitting: Internal Medicine

## 2009-11-25 ENCOUNTER — Encounter: Payer: Self-pay | Admitting: Internal Medicine

## 2009-12-27 ENCOUNTER — Ambulatory Visit: Payer: Self-pay | Admitting: Internal Medicine

## 2010-01-02 ENCOUNTER — Ambulatory Visit: Payer: Self-pay | Admitting: Internal Medicine

## 2010-01-16 ENCOUNTER — Telehealth (INDEPENDENT_AMBULATORY_CARE_PROVIDER_SITE_OTHER): Payer: Self-pay | Admitting: *Deleted

## 2010-01-17 ENCOUNTER — Encounter: Payer: Self-pay | Admitting: Internal Medicine

## 2010-01-24 ENCOUNTER — Encounter: Payer: Self-pay | Admitting: Internal Medicine

## 2010-02-26 HISTORY — PX: INSERT / REPLACE / REMOVE PACEMAKER: SUR710

## 2010-03-17 ENCOUNTER — Ambulatory Visit
Admission: RE | Admit: 2010-03-17 | Discharge: 2010-03-17 | Payer: Self-pay | Source: Home / Self Care | Attending: Internal Medicine | Admitting: Internal Medicine

## 2010-03-17 ENCOUNTER — Encounter: Payer: Self-pay | Admitting: Internal Medicine

## 2010-03-18 ENCOUNTER — Encounter: Payer: Self-pay | Admitting: Gastroenterology

## 2010-03-18 ENCOUNTER — Encounter: Payer: Self-pay | Admitting: Internal Medicine

## 2010-03-19 ENCOUNTER — Encounter: Payer: Self-pay | Admitting: Surgery

## 2010-03-19 ENCOUNTER — Encounter: Payer: Self-pay | Admitting: Internal Medicine

## 2010-03-24 ENCOUNTER — Encounter: Payer: Self-pay | Admitting: Internal Medicine

## 2010-03-26 LAB — CONVERTED CEMR LAB
AST: 37 units/L (ref 0–37)
Cholesterol: 160 mg/dL (ref 0–200)
HDL: 58.8 mg/dL (ref >39.00)
LDL Cholesterol: 84 mg/dL (ref 0–99)
Total CHOL/HDL Ratio: 3
Triglycerides: 84 mg/dL (ref 0.0–149.0)
VLDL: 16.8 mg/dL (ref 0.0–40.0)
Vit D, 25-Hydroxy: 35 ng/mL (ref 30–89)

## 2010-03-30 NOTE — Assessment & Plan Note (Signed)
Summary: right side pain x 2 days/njr   Vital Signs:  Patient profile:   55 year old female Menstrual status:  postmenopausal Weight:      156 pounds Temp:     97.7 degrees F oral Pulse rate:   60 / minute BP sitting:   110 / 70  (right arm) Cuff size:   regular  Vitals Entered By: Romualdo Bolk, CMA (AAMA) (January 19, 2009 2:07 PM) CC: RLQ pain that has been going on for 2 days. Pt states that it hurts worse after eating. Pt is not having nausea or vomiting with it. Pain scale- pain is a 7 today and is more constant. Pt states that it hurts more after eating.   History of Present Illness: [name] comes in today for  above signs . 2 day onset of insidiuous and persistent and progressive  right lower abd pain worse with eating and some nausea but no vomiting.   Feels like the same pain of diverticulitis except on the right side.  No change in bowel habits otherwise and urinary signs over baseline abnormality .  No fever or flank  pain.    She called Gi and they told her to call PCP.   hx of diverticultitis rx as op a year ago and had colon then .  Still has appendix and GB .   Last  blood tests done in september  getting b12 shots.  Preventive Screening-Counseling & Management  Alcohol-Tobacco     Alcohol drinks/day: <1     Alcohol type: wine     Smoking Status: never  Caffeine-Diet-Exercise     Caffeine use/day: 0     Does Patient Exercise: yes     Type of exercise: WALKING  Current Medications (verified): 1)  Catapres-Tts-2 0.2 Mg/24hr  Ptwk (Clonidine Hcl) 2)  Catapres 0.1 Mg  Tabs (Clonidine Hcl) .Marland Kitchen.. 1 By Mouth At Bedtime 3)  Wellbutrin Xl 150 Mg  Tb24 (Bupropion Hcl) .Marland Kitchen.. 1 By Mouth Once Daily 4)  Phenobarbital 64.8 Mg  Tabs (Phenobarbital) .Marland Kitchen.. 1 By Mouth At Bedtime 5)  Lopressor 50 Mg  Tabs (Metoprolol Tartrate) .Marland Kitchen.. 1 By Mouth Three Times A Day 6)  Zofran 8 Mg  Tabs (Ondansetron Hcl) .... As Needed 7)  Aspirin 81 Mg  Tabs (Aspirin) .Marland Kitchen.. 1 Tab Once  Daily 8)  Klor-Con M20 20 Meq  Tbcr (Potassium Chloride Crys Cr) .... 4 By Mouth Once Daily 9)  Protonix 40 Mg  Tbec (Pantoprazole Sodium) .... As Needed 10)  Labetalol Hcl 200 Mg Tabs (Labetalol Hcl) .... Take Two Tablets By Mouth Every Day 11)  Simvastatin 10 Mg Tabs (Simvastatin) .... Take One Tablet By Mouth Daily At Bedtime 12)  Iv Hydration Once Weekly 13)  Vitamin D 400 Unit  Tabs (Cholecalciferol) .Marland Kitchen.. 1 By Mouth Once Daily 14)  Vitamin B12 Injection .... Once Weekly  Allergies (verified): 1)  ! Penicillin 2)  ! Sulfa 3)  ! Doxycycline 4)  ! Erythromycin  Past History:  Past medical, surgical, family and social histories (including risk factors) reviewed, and no changes noted (except as noted below).  Past Medical History: HISTORY OF MALIGNANT MELANOMA OF SKIN (ICD-V10.82) ARM PAIN, LEFT (ICD-729.5) INJURY UNSPEC NERVE SHOULDER GIRDLE&UPPER LIMB (ICD-955.9) diverticulitis ? 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 PACEMAKER, PERMANENT (ICD-V45.01) Fatty tumors removed, chest wall. Nasal surgeries Melanoma removal  Family History: Reviewed history from 09/26/2007 and no changes required. Family History High cholesterol Family History Hypertension cad, cva parents    Social History: Reviewed history from 08/09/2008 and no changes required. Married remote smoker  Alcohol use-no self employed   Review of Systems       The patient complains of anorexia.  The patient denies fever, vision loss, prolonged cough, melena, hematochezia, severe indigestion/heartburn, hematuria,  transient blindness, enlarged lymph nodes, and angioedema.         chills   no documented fever   Physical Exam  General:  alert, well-developed, well-nourished, and well-hydrated.  in nad   mildy uncomfortable Head:  normocephalic and atraumatic.   Eyes:  vision grossly intact, pupils equal, and pupils round.   Lungs:  normal respiratory effort, no intercostal retractions, no accessory muscle use, normal breath sounds, and no dullness.   Heart:  normal rate, regular rhythm, and no gallop.   Abdomen:  bs present and decreased  no rebound tenderness, no hepatomegaly, and no tender ness rlq and alse along left abdomen  no rebound ? voluntary guard Pulses:  nlcap refill  Extremities:  no sig edema Neurologic:  alert & oriented X3.   Skin:  turgor normal, color normal, no ecchymoses, and no petechiae.   Cervical Nodes:  No lymphadenopathy noted Psych:  Oriented X3, good eye contact, not anxious appearing, and not depressed appearing.     Impression & Recommendations:  Problem # 1:  ABDOMINAL PAIN RIGHT LOWER QUADRANT (ICD-789.03) Assessment New new onset and progressive  some on left   concern about   early appendicitis   vs diverticultitis .     get Ucx r/o infection  .. can empirically start medication depending on results.   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 1 by mouth at bedtime 3)  Wellbutrin Xl 150 Mg Tb24 (Bupropion hcl) .Marland Kitchen.. 1 by mouth once daily 4)  Phenobarbital 64.8 Mg Tabs (Phenobarbital) .Marland Kitchen.. 1 by mouth at bedtime 5)  Lopressor 50 Mg Tabs (Metoprolol tartrate) .Marland Kitchen.. 1 by mouth three times a day 6)  Zofran 8 Mg Tabs (Ondansetron hcl) .... As needed 7)  Aspirin 81 Mg Tabs (Aspirin) .Marland Kitchen.. 1  tab once daily 8)  Klor-con M20 20 Meq Tbcr (Potassium chloride crys cr) .... 4 by mouth once daily 9)  Protonix 40 Mg Tbec (Pantoprazole sodium) .... As needed 10)  Labetalol Hcl 200 Mg Tabs (Labetalol hcl) .... Take two tablets by mouth every day 11)  Simvastatin 10 Mg Tabs (Simvastatin) .... Take one tablet by mouth daily at bedtime 12)  Iv Hydration Once Weekly  13)  Vitamin D 400 Unit Tabs (Cholecalciferol) .Marland Kitchen.. 1 by mouth once daily 14)  Vitamin B12 Injection  .... Once weekly 15)  Cipro 500 Mg Tabs (Ciprofloxacin hcl) .Marland Kitchen.. 1 by mouth two times a day 16)  Metronidazole 500 Mg Tabs (Metronidazole) .Marland Kitchen.. 1 by mouth three times a day  Patient Instructions: 1)  get ct scan now  2)  antibiotic rx given and will fill depending on the results .  3)  cell [phone] Prescriptions: METRONIDAZOLE 500 MG TABS (METRONIDAZOLE) 1 by mouth three times a day  #30 x 0   Entered and Authorized by:   Madelin Headings MD   Signed by:   Madelin Headings MD on 01/19/2009   Method used:   Print then Give to Patient   RxID:   [id number] CIPRO 500 MG TABS (CIPROFLOXACIN HCL) 1 by mouth two times a day  #20 x 0   Entered and Authorized by:   Madelin Headings MD   Signed by:   Madelin Headings MD on 01/19/2009   Method used:   Print then Give to Patient   RxID:   [id number]   Laboratory Results   Urine Tests  Date/Time Received: January 19, 2009 2:25 PM   Routine Urinalysis   Glucose: negative   (Normal Range: Negative) Bilirubin: negative   (Normal Range: Negative) Ketone: negative   (Normal Range: Negative) Spec. Gravity: 1.020   (Normal Range: 1.003-1.035) Blood: negative   (Normal Range: Negative) pH: 6.5   (Normal Range: 5.0-8.0) Protein: negative   (Normal Range: Negative) Urobilinogen: 0.2   (Normal Range: 0-1) Nitrite: negative   (Normal Range: Negative) Leukocyte Esterace: small   (Normal Range: Negative)    Comments: Romualdo Bolk, CMA (AAMA)  January 19, 2009 2:26  PM

## 2010-03-30 NOTE — Progress Notes (Signed)
Summary: fu on nos appt 10-8  Phone Note Call from Patient   Caller: Patient Call For: Dr. Fabian Sharp Summary of Call: Pt calls stating that she is having LLQ pain with low grade fever.  She insists Dr. Fabian Sharp treat her for diverticulitis.  Does  not want to call Dr. Juanda Chance, and would prefer not to come to the office. [phone] Initial call taken by: Lynann Beaver CMA,  December 03, 2007 10:53 AM  Follow-up for Phone Call        cannot   treat over the phone.   Follow-up by: Madelin Headings MD,  December 03, 2007 12:56 PM  Additional Follow-up for Phone Call Additional follow up Details #1::        Appt given for tomorrow. Additional Follow-up by: Lynann Beaver CMA,  December 03, 2007 1:24 PM    Additional Follow-up for Phone Call Additional follow up Details #2::    call pt and see how she is doing  [facility] Rudy Jew, RN  December 05, 2007 9:07 AM.  Reached patient & says she was started on Metformin 500mg  once daily 3 wks ago & whoever she talked with here told her to call endocrinologist and tolerated it fine for two weeks.  Says she was told to eat mashed potatoes but can't because of the diet Dr. Lurene Shadow put her on.  Isn't doing well, has the left side pain & discomfort.  Has called Dr. Lurene Shadow, lots of testing that she wants Dr. Demetrius Charity to have & is faxing.  Just doesn't have the Glucose Tolerance testing yet.  Thought she wan't to come to ov Dr. Fabian Sharp after talking to whoever here who told her to call endocrinologist.  Rudy Jew, RN  December 05, 2007 9:21 AM   Follow-up by: Madelin Headings MD,  December 04, 2007 12:30 PM

## 2010-03-30 NOTE — Consult Note (Signed)
Summary: [facility] Dr Talmage Nap  hypoglycemia  [facility]   Imported By: Maryln Gottron 12/11/2007 15:40:31  _____________________________________________________________________  External Attachment:    Type:   Image     Comment:   External Document

## 2010-03-30 NOTE — Miscellaneous (Signed)
Summary: [facility]   Imported By: Kassie Mends 08/20/2008 11:30:07  _____________________________________________________________________  External Attachment:    Type:   Image     Comment:   External Document

## 2010-03-30 NOTE — Miscellaneous (Signed)
Summary: [facility] Report  [facility] Report   Imported By: Kassie Mends 08/26/2008 10:58:01  _____________________________________________________________________  External Attachment:    Type:   Image     Comment:   External Document

## 2010-03-30 NOTE — Assessment & Plan Note (Signed)
Summary: LEFT ARM SWOLLEN/NTA   Vital Signs:  Patient Profile:   55 Years Old Female Weight:      155 pounds Pulse rate:   68 / minute BP sitting:   116 / 78  (right arm) Cuff size:   regular  Vitals Entered By: Romualdo Bolk, CMA (June 25, 2007 10:45 AM)                 Chief Complaint:  Left arm swelling.  History of Present Illness: [name] is here for left forearm swelling and pain that started 06/24/07.  she may have noted at after work on the computer. Pt states that it stings to touch, there is a knot feeling  there and today she started having numbness in ring and pinky fingers. It is also warm to touch. Pt was suppose to have surgery on her nose today but cancelled it due to this.  she has no associated fever. There is no trauma. However, she gets routine IV fluids for her POTS dysautomia.   She was stuck in that arm previously and in that area, although the IV fluids were not given their about a week or two ago.     She has no history of these symptoms. However, it feels similar to the pain she had when she had a lower extremity DVT.    There are no systemic symptoms otherwise.    Prior Medications Reviewed Using: Patient Recall  Updated Prior Medication List: CATAPRES-TTS-2 0.2 MG/24HR  PTWK (CLONIDINE HCL)  CATAPRES 0.1 MG  TABS (CLONIDINE HCL) 1 by mouth at bedtime WELLBUTRIN XL 150 MG  TB24 (BUPROPION HCL) 1 by mouth once daily PHENOBARBITAL 64.8 MG  TABS (PHENOBARBITAL) 1 by mouth at bedtime LOPRESSOR 50 MG  TABS (METOPROLOL TARTRATE) 1 by mouth three times a day ZOFRAN 8 MG  TABS (ONDANSETRON HCL) as needed ASPIRIN 81 MG  TABS (ASPIRIN)  KLOR-CON M20 20 MEQ  TBCR (POTASSIUM CHLORIDE CRYS CR) 4 by mouth once daily PROTONIX 40 MG  TBEC (PANTOPRAZOLE SODIUM) 1 by mouth once daily LABETALOL HCL 200 MG  TABS (LABETALOL HCL) 1 by mouth two times a day INDERAL LA 60 MG  CP24 (PROPRANOLOL HCL) 1 by mouth once daily  Current Allergies (reviewed  today): ! PENICILLIN ! SULFA ! DOXYCYCLINE ! ERYTHROMYCIN  Past Medical History:    Osteopenia    POTS dysautomia        self cath urine     g0p0    nasal surgery for nasal bleeding        Past Surgical History:    Permanent pacemaker   Family History:    Family History High cholesterol    Family History Hypertension    cad, cva parents  Social History:    hh of 2     remote smoker     Alcohol use-no    self employed    Risk Factors:  Alcohol use:  no   Review of Systems  The patient denies anorexia, fever, chest pain, dyspnea on exhertion, peripheral edema, and prolonged cough.         recurrent nasal bleeding   surgeryplanned    Physical Exam  General:     alert, well-developed, and well-nourished.   Head:     normocephalic and atraumatic.   Neck:     No deformities, masses, or tenderness noted. Lungs:     normal respiratory effort, no intercostal retractions, and no accessory muscle use.   Heart:  normal rate, regular rhythm, and no murmur.   Extremities:     left upper extremity with some diffuse swelling in the medial forearm with some mild erythema pens. Moderate tenderness to palpation.  No streaking more massive, although it does feel full in this area. There is no crepitus. No swelling in joints. There is no upper arm swelling or edema. Hands have full range of motion and otherwise normal color.left upper extremity is normal Skin:     no bruising , no rashes Psych:     normally interactive, good eye contact, and not anxious appearing.      Impression & Recommendations:  Problem # 1:  SWELLING OF LIMB (ICD-729.81) swelling and pain of unknown etiology. We will get a Doppler to rule out deep venous clotting. She does have a history of needlestick trauma from her recurrent IVs. So will give empiric Antibiotic treatment cover early cellulitis Pt's cell [phone]  will call the results of her Doppler to her  doppler neg for clot  take  antibiotic , elevated on, and call in two days about progress.  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 1 by mouth at bedtime 3)  Wellbutrin Xl 150 Mg Tb24 (Bupropion hcl) .Marland Kitchen.. 1 by mouth once daily 4)  Phenobarbital 64.8 Mg Tabs (Phenobarbital) .Marland Kitchen.. 1 by mouth at bedtime 5)  Lopressor 50 Mg Tabs (Metoprolol tartrate) .Marland Kitchen.. 1 by mouth three times a day 6)  Zofran 8 Mg Tabs (Ondansetron hcl) .... As needed 7)  Aspirin 81 Mg Tabs (Aspirin) 8)  Klor-con M20 20 Meq Tbcr (Potassium chloride crys cr) .... 4 by mouth once daily 9)  Protonix 40 Mg Tbec (Pantoprazole sodium) .Marland Kitchen.. 1 by mouth once daily 10)  Labetalol Hcl 200 Mg Tabs (Labetalol hcl) .Marland Kitchen.. 1 by mouth two times a day 11)  Inderal La 60 Mg Cp24 (Propranolol hcl) .Marland Kitchen.. 1 by mouth once daily 12)  Levaquin 750 Mg Tabs (Levofloxacin) .Marland Kitchen.. 1 by mouth once daily     Prescriptions: LEVAQUIN 750 MG  TABS (LEVOFLOXACIN) 1 by mouth once daily  #10 x 0   Entered and Authorized by:   Madelin Headings MD   Signed by:   Madelin Headings MD on 06/25/2007   Method used:   Electronically sent to ...       [facility]       [address]       [place], [name]  [address]       Ph: [phone]       Fax: [phone]   RxID:   [id number]  ]

## 2010-03-30 NOTE — Letter (Signed)
Summary: Home Health Cert  & Plan of Care  Home Health Cert  & Plan of Care   Imported By: Marylou Mccoy 11/01/2009 08:37:22  _____________________________________________________________________  External Attachment:    Type:   Image     Comment:   External Document

## 2010-03-30 NOTE — Letter (Signed)
Summary: Appointment - Reminder 2  [facility], Main Office  [address]   [place], [name] [address]   Phone: [phone]  Fax: [phone]     August 12, 2009 MRN: [id number]   [facility] [address] [place], [name]  [address]   Dear Ms. [name],  Our records indicate that it is time to schedule a follow-up appointment with Dr. Tenny Craw in August. It is very important that we reach you to schedule this appointment. We look forward to participating in your health care needs. Please contact us at the number listed above at your earliest convenience to schedule your appointment.  If you are unable to make an appointment at this time, give [name] a call so we can update our records.     Sincerely,   Migdalia Dk [facility] Scheduling Team

## 2010-03-30 NOTE — Discharge Summary (Signed)
Summary: LLQ Pain  NAME:  [name], [name]              ACCOUNT NO.:  [id number]      MEDICAL RECORD NO.:  [id number]          PATIENT TYPE:  INP      LOCATION:  1307                         FACILITY:  [facility]      PHYSICIAN:  Hedwig Morton. Juanda Chance, MD     DATE OF BIRTH:  Feb 20, 1956      DATE OF ADMISSION:  08/12/2006   DATE OF DISCHARGE:  08/13/2006                                  DISCHARGE SUMMARY      ADMITTING DIAGNOSES:   62. A 55 year old white female admitted for bowel prep and colonoscopy       with a history of left lower quadrant pain and chronic intermittent       diarrhea.   2. High risk for orthostatic syncope during prep with history of beta       hypersensitivity postural orthostatic tachycardia syndrome.   3. Status post pacemaker placement in 1999.      DISCHARGE DIAGNOSES:   1. Stable post colonoscopy with findings of moderate to severe sigmoid       diverticulosis suspect symptoms due to irritable bowel syndrome.   2. High risk for orthostatic syncope during prep with history of beta       hypersensitivity postural orthostatic tachycardia syndrome.   3. Status post pacemaker placement in 1999.      CONSULTATIONS:  None.      PROCEDURES:  Colonoscopy and biopsies per Dr. [name].      BRIEF HISTORY:  [name] is a 55 year old, white female with a complicated   past medical history.  She is admitted to undergo bowel prep and   colonoscopy to evaluate left lower quadrant abdominal pain and history   of diverticulitis and intermittent diarrhea.  She has a history of POTS   syndrome and is status post pacemaker placement in '99 and then ablation   in 2002. It was felt that she was at high risk for orthostatic syncope   during the prep and has had multiple syncopal episodes over the years.   She is on multiple medications and on home IV fluids due to problems   with frequent dehydration.      LABORATORY STUDIES:  On admission WBC of 4.7, hemoglobin 14.4,   hematocrit of  41.9, MCV of 85, sed rate of 5.  Electrolytes within   normal limits, creatinine 0.6. Liver function studies normal.  Albumin   4.1, antibody for IgG was 0, antibody for IgA was 0.6.  Serum serotonin   level was 100 which is low.      HOSPITAL COURSE:  The patient was admitted to the service of Dr. [name] on observation status in order to undergo colon prep for a   colonoscopy. She was placed on IV fluids, D5 half normal saline at 100   mL an hour and kept on a clear liquid diet.  She was prepped with   MiraLax prep, continued on her usual regimen of home medications.  She   tolerated the prep and underwent colonoscopy the following day  with Dr.   Juanda Chance. Again tolerated the procedure without difficulty.  She was found   to have moderate diverticulosis of the sigmoid colon and several deep   wide mouth diverticula, no polyps.  She did have some random biopsies   taken to rule out a microscopic colitis and these returned showing no   active inflammation.  The patient was discharged to home post procedure   with instructions to follow up with Dr. [name] as an outpatient. No   change in her medication regimen.      NEW MEDICATIONS:  Bentyl 10 mg p.r.n. b.i.d. to t.i.d. as needed for   cramping and diarrhea.      CONDITION ON DISCHARGE:  Stable.               Amy Chenoa, PA-C               Dora M. Juanda Chance, MD   Electronically Signed         AE/MEDQ  D:  08/26/2006  T:  08/26/2006  Job:  [id number]

## 2010-03-30 NOTE — Progress Notes (Signed)
Summary: Left Arm Pain        Additional Follow-up for Phone Call Additional follow up Details #2::    Dr. Tenny Craw called pt. to check on the status of her arm... pt. reports more pain and swelling of left arm...Dr. Tenny Craw ordered venous doppler r/o DVT. [name]. will come at 1130 am today for scan.

## 2010-03-30 NOTE — Miscellaneous (Signed)
Summary: [facility] Orders  [facility] Orders   Imported By: Roderic Ovens 12/27/2008 16:23:58  _____________________________________________________________________  External Attachment:    Type:   Image     Comment:   External Document

## 2010-03-30 NOTE — Miscellaneous (Signed)
  Clinical Lists Changes  Medications: Changed medication from VITAMIN D3 50000 UNIT CAPS (CHOLECALCIFEROL) take 3 tabs weekly to VITAMIN D3 50000 UNIT CAPS (CHOLECALCIFEROL) 1 cap every day

## 2010-03-30 NOTE — Letter (Signed)
Summary: Generic Letter  [facility], Main Office  [address]   [place], [name] [address]   Phone: [phone]  Fax: [phone]    03/17/2010         To Whom It May Concern:  [name] (DOB: 06/26/1955) is a patient I follow in cardiology clinic.  She has severe dysautonomia and atrial tachycardia.  I follow her in clinic along with Berton Mount, M.D.  It has come to my attention that she will be involved in a trial in the near future.  It is imperative that she be allowed to drink fluids frequently (keep a bottle with her) and get up to move around, use bathroom as needed.  By doing this she will hopefully avoid significant hypotension and tachycardia.  If you have any questions please call me at [phone].  Sincerely,   Dietrich Pates, MD, [facility]

## 2010-03-30 NOTE — Letter (Signed)
Summary: Home Health Cert  [facility]   Imported By: Marylou Mccoy 02/13/2010 18:40:46  _____________________________________________________________________  External Attachment:    Type:   Image     Comment:   External Document

## 2010-03-30 NOTE — Progress Notes (Signed)
Summary: Phenobarbital refill  Phone Note Call from Patient   Summary of Call: needs refill called to [facility] Initial call taken by: J REISS RN    New/Updated Medications: PHENOBARBITAL 64.8 MG TABS (PHENOBARBITAL) 1 tab at bedtime Prescriptions: PHENOBARBITAL 64.8 MG TABS (PHENOBARBITAL) 1 tab at bedtime  #30 x 6   Entered by:   Layne Benton, RN, BSN   Authorized by:   Sherrill Raring, MD, [facility]   Signed by:   Layne Benton, RN, BSN on 01/16/2010   Method used:   Printed then faxed to ...       [facility] [address] (retail)       [address]       [place], [name]  [address]       Ph: [phone]       Fax: [phone]   RxID:   [id number]

## 2010-03-30 NOTE — Progress Notes (Signed)
Summary: TRIAGE--Diverticulitis   Phone Note Call from Patient Call back at Home Phone [phone]   Call For: Dr Juanda Chance Reason for Call: Talk to Nurse Summary of Call: Diverticulitis flare up-Pain started last night she would says its a 10 + +. Wants to be seen today if possible. Initial call taken by: Leanor Kail [facility],  December 08, 2007 8:10 AM  Follow-up for Phone Call        PT. WALKED INTO OFFICE W/SEVERE ABD. PAIN. PAULA WILL SEE HER. Follow-up by: Laureen Ochs LPN,  December 08, 2007 10:33 AM

## 2010-03-30 NOTE — Cardiovascular Report (Signed)
Summary: Office Visit   Office Visit   Imported By: Roderic Ovens 06/03/2009 13:14:03  _____________________________________________________________________  External Attachment:    Type:   Image     Comment:   External Document

## 2010-03-30 NOTE — Progress Notes (Signed)
Summary: [facility] calling about the pt receritfy pt care  Phone Note From Other Clinic   Caller: [facility]/ [phone] Summary of Call: [facility] calling regarding the pt recertify the pt sending 485 form Initial call taken by: Judie Grieve,  May 27, 2009 1:53 PM  Follow-up for Phone Call        [facility] called...refaxed orders. Follow-up by: Suzan Garibaldi RN

## 2010-03-30 NOTE — Progress Notes (Signed)
Summary: Abd Pain  Phone Note From Other Clinic   Caller: Aurther Loft @Dr  Panosh [phone] Call For: DR Juanda Chance Reason for Call: Schedule Patient Appt Summary of Call: Abd Pain RLQ for a while. Wants sooner than next available on 03-21-2009. Call pt directly. Initial call taken by: Leanor Kail [facility],  January 27, 2009 3:23 PM  Follow-up for Phone Call        Pt. will see Dr.Stiles Maxcy on 02-01-09 at 10am. Follow-up by: Laureen Ochs LPN,  January 27, 2009 4:28 PM

## 2010-03-30 NOTE — Miscellaneous (Signed)
  Clinical Lists Changes  Medications: Removed medication of VITAMIN D 400 UNIT  TABS (CHOLECALCIFEROL) 1 by mouth once daily

## 2010-03-30 NOTE — Miscellaneous (Signed)
Summary: Home Health Certification/Care Plan   Home Health Certification/Care Plan   Imported By: Roderic Ovens 02/14/2010 11:54:13  _____________________________________________________________________  External Attachment:    Type:   Image     Comment:   External Document

## 2010-03-30 NOTE — Letter (Signed)
Summary: Physician Orders  Physician Orders   Imported By: Roderic Ovens 10/15/2008 13:54:54  _____________________________________________________________________  External Attachment:    Type:   Image     Comment:   External Document

## 2010-03-30 NOTE — Progress Notes (Signed)
Summary: Dr. [name]   Pt. called ...she saw Dr. Berneice Gandy in [facility] and he recommended that she change vitamin D2 to D3 by mouth 50,000 units 1 time per week along with Vitamin B12 injections 500 IM 2 times per week. [facility] and will fax order to [facility] for B12 injections. Pt. will need a Vitamin B and D level in apx 6 weeks..will order labs to be drawn at [facility]. Dr. Tenny Craw aware of these orders..will place under her name.  Suzan Garibaldi rn  Appended Document: Dr. [name] Spoke with [facility]..they can obtain Vitamin B12 1000mg /ml  injectable...will fax order from Dr.Ross.

## 2010-03-30 NOTE — Progress Notes (Signed)
Summary: ? re instructions  Phone Note Call from Patient Call back at Home Phone [phone]   Caller: Patient Call For: Rowan Blaker Reason for Call: Talk to Nurse Summary of Call: Patient has questions regarding instructions before procedure tomorrow. Initial call taken by: Tawni Levy,  March 23, 2009 1:53 PM  Follow-up for Phone Call        Patient has questions regarding times of her procedure as well as time to stop eating. All questions answered. Follow-up by: Hortense Ramal CMA Duncan Dull),  March 23, 2009 3:27 PM

## 2010-03-30 NOTE — Letter (Signed)
Summary: Dr Lazarus Salines note  Dr Lazarus Salines note   Imported By: Kassie Mends 02/03/2007 08:29:32  _____________________________________________________________________  External Attachment:    Type:   Image     Comment:   Dr Lazarus Salines note

## 2010-03-30 NOTE — Miscellaneous (Signed)
Summary: [facility] Orders  [facility] Orders   Imported By: Roderic Ovens 10/18/2008 15:22:27  _____________________________________________________________________  External Attachment:    Type:   Image     Comment:   External Document

## 2010-03-30 NOTE — Assessment & Plan Note (Signed)
Summary: 6 month follow up/dn   History of Present Illness Visit Type: Follow-up Visit Primary GI MD: [name] MD Primary Provider: Berniece Andreas MD Requesting Provider: n/a Chief Complaint: 6 month follow-up is doing much better on Miralax.  Does c/o abdominal pain after certain foods. Does have Vitamin D Deficiency History of Present Illness:   This is a 55 year old white female with dysautonomia or POT's syndrome. Her last office visit with [name] was in February 2011. She has symptomatic sigmoid diverticulosis and constipation which has been treated with MiraLax one half to one capful daily. She is currently asymptomatic. A flexible sigmoidoscopy in January 2011 showed moderately severe diverticulosis. She has taken Bentyl 10 mg p.r.n. She was once evaluated for a sigmoid resection by Dr. Daphine Deutscher but has decided against surgery. She had a melanoma resected from her left leg and right arm. Her last full colonoscopy was in June 2008. A CT Scan of the abdomen in November 2010 did not show any evidence of diverticulitis.   GI Review of Systems    Reports abdominal pain.      Denies acid reflux, belching, bloating, chest pain, dysphagia with liquids, dysphagia with solids, heartburn, loss of appetite, nausea, vomiting, vomiting blood, weight loss, and  weight gain.        Denies anal fissure, black tarry stools, change in bowel habit, constipation, diarrhea, diverticulosis, fecal incontinence, heme positive stool, hemorrhoids, irritable bowel syndrome, jaundice, light color stool, liver problems, rectal bleeding, and  rectal pain. Preventive Screening-Counseling & Management  Alcohol-Tobacco     Smoking Status: never      Drug Use:  no.      Current Medications (verified): 1)  Catapres-Tts-2 0.2 Mg/24hr  Ptwk (Clonidine Hcl) .... Once A Week 2)  Catapres 0.1 Mg  Tabs (Clonidine Hcl) .Marland Kitchen.. 1 By Mouth At Bedtime 3)  Wellbutrin Xl 150 Mg  Tb24 (Bupropion Hcl) .Marland Kitchen.. 1 By Mouth Once Daily 4)   Zofran 8 Mg  Tabs (Ondansetron Hcl) .... As Needed 5)  Aspirin 81 Mg  Tabs (Aspirin) .Marland Kitchen.. 1 Tab Once Daily 6)  Klor-Con M20 20 Meq  Tbcr (Potassium Chloride Crys Cr) .... 4 By Mouth Once Daily 7)  Protonix 40 Mg  Tbec (Pantoprazole Sodium) .... As Needed 8)  Labetalol Hcl 200 Mg Tabs (Labetalol Hcl) .... One By Mouth Two Times A Day 9)  Simvastatin 10 Mg Tabs (Simvastatin) .... Take One Tablet By Mouth Daily At Bedtime(Hold*_8-29-2011) 10)  Iv Hydration Once Weekly 11)  Vitamin B12 Injection .... Once Weekly 12)  Miralax  Powd (Polyethylene Glycol 3350) .... Take 1/2 Capful Dissolved in At Least 8 Ounces of Water/juice Once Daily. 13)  Metoprolol Succinate 100 Mg Xr24h-Tab (Metoprolol Succinate) .... Take One and A Half Tablets Every Day 14)  Metoprolol Tartrate 50 Mg Tabs (Metoprolol Tartrate) .... As Needed 15)  Vitamin D3 50000 Unit Caps (Cholecalciferol) .... Take 3 Tabs Weekly  Allergies (verified): 1)  ! Penicillin 2)  ! Sulfa 3)  ! Doxycycline 4)  ! Erythromycin  Past History:  Past Medical History: HISTORY OF MALIGNANT MELANOMA OF SKIN (ICD-V10.82) ARM PAIN, LEFT (ICD-729.5) INJURY UNSPEC NERVE SHOULDER GIRDLE&UPPER LIMB (ICD-955.9) diverticulitis ? 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 PACEMAKER, PERMANENT (ICD-V45.01)-placed 1999 RF Ablation-2002 Fatty tumors removed, chest wall. Nasal surgeries Melanoma removal T & A Carpal Tunnel Release  Family History: Reviewed history from 04/20/2009 and no changes required. Family History High cholesterol Family  History Hypertension cad, cva parents  Family History of Diabetes: Maternal Grandmother, Paternal Grandmother Family History of Heart Disease: Mother, Father No FH of Colon Cancer:  Social History: Reviewed history from 08/09/2008 and no changes required. Married no children  Alcohol use-no self employed  Patient has never smoked.  Illicit Drug Use - no Drug Use:  no  Review of Systems       The patient complains of fatigue.  The patient denies allergy/sinus, anemia, anxiety-new, arthritis/joint pain, back pain, blood in urine, breast changes/lumps, change in vision, confusion, cough, coughing up blood, depression-new, fainting, fever, headaches-new, hearing problems, heart murmur, heart rhythm changes, itching, menstrual pain, muscle pains/cramps, night sweats, nosebleeds, pregnancy symptoms, shortness of breath, skin rash, sleeping problems, sore throat, swelling of feet/legs, swollen lymph glands, thirst - excessive , urination - excessive , urination changes/pain, urine leakage, vision changes, and voice change.         Pertinent positive and negative review of systems were noted in the above HPI. All other ROS was otherwise negative.   Vital Signs:  Patient profile:   55 year old female Menstrual status:  postmenopausal Height:      63 inches Weight:      148 pounds BMI:     26.31 Pulse rate:   84 / minute Pulse rhythm:   regular BP sitting:   106 / 64  (left arm)  Vitals Entered By: Milford Cage NCMA (November 01, 2009 1:20 PM)   Impression & Recommendations:  Problem # 1:  * POTS SYNDROME followed by Dr Tenny Craw.  Problem # 2:  DIVERTICULOSIS, COLON, HX OF (ICD-V12.79) Patient is currently asymptomatic while taking MiraLax 17 g daily. We will refill her prescription for MiraLax. We have again discussed the possibly of surgery if she has frequent flareups. I have asked her to be on a high-fiber diet and I gave her a high-fiber diet outline.  Patient Instructions: 1)   high-fiber diet. 2)  Bentyl 10 mg p.r.n. 3)  Continue MiraLax 17 g daily (patient already has a 1 year prescription for this.) 4)  Return p.r.n. 5)  Copy sent to : Dr Tenny Craw, Dr W.Panosh 6)  The medication list was reviewed and reconciled.  All changed / newly prescribed medications were explained.  A complete medication list was provided to the patient / caregiver.

## 2010-03-30 NOTE — Letter (Signed)
Summary: Appointment - Reminder 2  [facility], Main Office  [address]   [place], [name] [address]   Phone: [phone]  Fax: [phone]     December 15, 2008 MRN: [id number]   [facility] [address] [place], [name]  [address]   Dear Ms. [facility],  Our records indicate that it is time to schedule a follow-up appointment with Dr. Tenny Craw. It is very important that we reach you to schedule this appointment. We look forward to participating in your health care needs. Please contact us at the number listed above at your earliest convenience to schedule your appointment.  If you are unable to make an appointment at this time, give [name] a call so we can update our records.  Sincerely,   Migdalia Dk [facility] Scheduling Team

## 2010-03-30 NOTE — Procedures (Signed)
Summary: Flexible Sigmoidoscopy  Patient: [name] Note: All result statuses are Final unless otherwise noted.  Tests: (1) Flexible Sigmoidoscopy (FLX)  FLX Flexible Sigmoidoscopy                             DONE (C)     [facility]     [address]     [place], [name]  [address]           FLEXIBLE SIGMOIDOSCOPY PROCEDURE REPORT           PATIENT:  [name], [name]  MR#:  [id number]     BIRTHDATE:  [birth year], 53 yrs. old  GENDER:  female           ENDOSCOPIST:  Hedwig Morton. Juanda Chance, MD     Referred by:  Neta Mends. Panosh, M.D.           PROCEDURE DATE:  03/24/2009     PROCEDURE:  Flexible Sigmoidoscopy, diagnostic     ASA CLASS:  Class II     INDICATIONS:  LLQ abd.pain, known diverticulosis, episodes of     "diverticulitis",CT scan shows no -itis     last colon 07/2006-tics     POTS syndrome           MEDICATIONS:   Versed 7 mg, Fentanyl 50 mcg           DESCRIPTION OF PROCEDURE:   After the risks benefits and     alternatives of the procedure were thoroughly explained, informed     consent was obtained.  Digital rectal exam was performed and     revealed no rectal masses.   The [id number] endoscope     was introduced through the anus and advanced to the splenic     flexure, without limitations.  The quality of the prep was fair.     The instrument was then slowly withdrawn as the mucosa was fully     examined.     <<PROCEDUREIMAGES>>           Moderate diverticulosis was found (see image1, image3, image4,     image6, image7, and image8). many deep and wide mouthed     diverticuli throughout the sigmoid colon, no sign of inflammation     or obstruction   Retroflexed views in the rectum revealed no     abnormalities.    The scope was then withdrawn from the patient     and the procedure terminated.           COMPLICATIONS:  None           ENDOSCOPIC IMPRESSION:     1) Moderate diverticulosis     no sign of diverticulitis, mod. severe disease  -resection not     indicated, suspect element of IBS/constipation     RECOMMENDATIONS:     Miralax 17 gm daily     LevsinSL.125 mg, # 30, i SL before meals prn abd.pain           REPEAT EXAM:  In 0 year(s) for.           ______________________________     Hedwig Morton. Juanda Chance, MD           CC:  Luretha Murphy, MD           n.     REVISED:  03/24/2009 04:19 PM     eSIGNED:   Hedwig Morton. Sheza Strickland at 03/24/2009 04:19  PM           [name], [name], [id number]  Note: An exclamation mark (!) indicates a result that was not dispersed into the flowsheet. Document Creation Date: 03/24/2009 4:19 PM _______________________________________________________________________  (1) Order result status: Final Collection or observation date-time: 03/24/2009 15:43 Requested date-time:  Receipt date-time:  Reported date-time:  Referring Physician:   Ordering Physician: [name] [phone]) Specimen Source:  Source: Launa Grill Order Number: [id number] Lab site:

## 2010-03-30 NOTE — Assessment & Plan Note (Signed)
Summary: leg pain and swelling/jr   Visit Type:  add/on Referring Provider:  n/a Primary Provider:  Berniece Andreas MD  CC:  leg pain and edema.  History of Present Illness: Patient is a 55 year old with a history of dysautonomia, diverticulosis.  I saw her earlier in the spring. She presents today because of LE discomfort.  SHe says it came on about 3 wks ago.  Accomp by swelling. Otherwise, no change in her foods, fluids.  No change in activity level.  Current Medications (verified): 1)  Catapres-Tts-2 0.2 Mg/24hr  Ptwk (Clonidine Hcl) .... Once A Week 2)  Catapres 0.1 Mg  Tabs (Clonidine Hcl) .Marland Kitchen.. 1 By Mouth At Bedtime 3)  Wellbutrin Xl 150 Mg  Tb24 (Bupropion Hcl) .Marland Kitchen.. 1 By Mouth Once Daily 4)  Zofran 8 Mg  Tabs (Ondansetron Hcl) .... As Needed 5)  Aspirin 81 Mg  Tabs (Aspirin) .Marland Kitchen.. 1 Tab Once Daily 6)  Klor-Con M20 20 Meq  Tbcr (Potassium Chloride Crys Cr) .... 4 By Mouth Once Daily 7)  Protonix 40 Mg  Tbec (Pantoprazole Sodium) .... As Needed 8)  Labetalol Hcl 200 Mg Tabs (Labetalol Hcl) .... One By Mouth Two Times A Day 9)  Simvastatin 10 Mg Tabs (Simvastatin) .... Take One Tablet By Mouth Daily At Bedtime(Hold*_8-29-2011) 10)  Iv Hydration Once Weekly 11)  Vitamin B12 Injection .... Once Weekly 12)  Miralax  Powd (Polyethylene Glycol 3350) .... Take 1/2 Capful Dissolved in At Least 8 Ounces of Water/juice Once Daily. 13)  Metoprolol Succinate 100 Mg Xr24h-Tab (Metoprolol Succinate) .... Take One and A Half Tablets Every Day 14)  Metoprolol Tartrate 50 Mg Tabs (Metoprolol Tartrate) .... As Needed  Allergies (verified): 1)  ! Penicillin 2)  ! Sulfa 3)  ! Doxycycline 4)  ! Erythromycin  Past History:  Past medical, surgical, family and social histories (including risk factors) reviewed, and no changes noted (except as noted below).  Past Medical History: Reviewed history from 01/19/2009 and no changes required. HISTORY OF MALIGNANT MELANOMA OF SKIN (ICD-V10.82) ARM PAIN,  LEFT (ICD-729.5) INJURY UNSPEC NERVE SHOULDER GIRDLE&UPPER LIMB (ICD-955.9) diverticulitis ? 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 PACEMAKER, PERMANENT (ICD-V45.01)-placed 1999 RF Ablation-2002 Fatty tumors removed, chest wall. Nasal surgeries Melanoma removal T & A Carpal Tunnel Release  Family History: Reviewed history from 04/20/2009 and no changes required. Family History High cholesterol Family History Hypertension cad, cva parents  Family History of Diabetes: Maternal Grandmother, Paternal Grandmother Family History of Heart Disease: Mother, Father No FH of Colon Cancer:  Social History: Reviewed history from 08/09/2008 and no changes required. Married remote smoker  Alcohol use-no self employed   Vital Signs:  Patient profile:   55 year old female Menstrual status:  postmenopausal Height:      63 inches Weight:      148 pounds Pulse rate:   66 / minute BP sitting:   126 / 73  (left arm) Cuff size:   regular  Vitals Entered By: Burnett Kanaris, CNA (October 24, 2009 3:10 PM)  Physical Exam  Additional Exam:  Patient is in NAD HEENT:  Normocephalic, atraumatic. EOMI, PERRLA.  Neck: JVP is normal. No thyromegaly. No  bruits.  Lungs: clear to auscultation. No rales no wheezes.  Heart: Regular rate and rhythm. Normal S1, S2. No S3.   No significant murmurs. PMI not displaced.  Abdomen:  Supple, nontender. Normal bowel sounds. No masses. No hepatomegaly.  Extremities:   Good distal pulses throughout.  No significant lower  extremity edema.  Musculoskeletal :moving all  extremities.  Neuro:   alert and oriented x3.    PPM Specifications Following MD:  Sherryl Manges, MD     Referring MD:  Proctor Carriker PPM Vendor:  [employer]     PPM Model Number:  [id number]     PPM Serial Number:  [id number] PPM DOI:  11/09/2003     PPM Implanting MD:  Sherryl Manges, MD  Lead 1    Location: RA     DOI: 05/04/1997     Model #: [id number]     Serial #: [id number]     Status: active Lead 2    Location: RV     DOI: 05/04/1997     Model #: [id number]     Serial #: [id number]     Status: active  Magnet Response Rate:  BOL 85 ERI  65  Indications:  Syncope   PPM Follow Up Pacer Dependent:  No      Episodes Coumadin:  No  Parameters Mode:  MVP     Lower Rate Limit:  60     Upper Rate Limit:  130 Paced AV Delay:  250     Sensed AV Delay:  200  Impression & Recommendations:  Problem # 1:  SWELLING OF LIMB (ICD-729.81) There is no significant swelling today in legs. Complaining of discomfort with this over the past few wks.  I wonder, with the hot weather, whether she has had increased venous insufficiency.  will check labs today.  Also I have recommended that she try to wear her support socks for a few days and follow.  No change in meds for now.  I do not think it is a side effect from them  Problem # 2:  * POTS SYNDROME Keep on same regimen.  Cont'd salt and fluid.  Other Orders: TLB-CBC Platelet - w/Differential (85025-CBCD) TLB-CK Total Only(Creatine Kinase/CPK) (82550-CK) TLB-BMP (Basic Metabolic Panel-BMET) (80048-METABOL) TLB-Sedimentation Rate (ESR) (85652-ESR) TLB-B12, Serum-Total ONLY (65784-O96) T-Antinuclear Antib (ANA) (29528-41324) T- * Misc. Laboratory test [phone])  Patient Instructions: 1)  Your physician recommends that you return for lab work in:lab work today  bmet cbc b12 vit d ck ana sed rate 2)  Your physician recommends that you schedule a follow-up appointment in: keep apointment in Sept with Dr.Jerret Mcbane

## 2010-03-30 NOTE — Assessment & Plan Note (Signed)
Summary: palps  needs pacer checked  jr  Medications Added ASPIRIN 81 MG  TABS (ASPIRIN) 1 tab once daily PROTONIX 40 MG  TBEC (PANTOPRAZOLE SODIUM) as needed SIMVASTATIN 10 MG TABS (SIMVASTATIN) Take one tablet by mouth daily at bedtime      Allergies Added:   Primary Provider:  Berniece Andreas MD   History of Present Illness: Patient is a 55 year old with a history of severe dysautonomia.  Since I saw her last she continues on the same medical regimen.  She is now getting IV infusions every other week since she only uses her L arm Since seen, see has had 2 melanomas removed (R shoulder, R leg).  She is now is being seen at [facility] for oncologic evaluation.  She will nee further excision on the leg lesion.  She is due to see [name] in a couple weeks. Matraca continues to be bothered by palpitations.  She has atrial tachycardia and has failed ablative attempts.  Spells are often short, but if they last more than a minute or two are debiliitating.  She says it takes her days to recover. Other issues:  glucose is better if she is careful with her diet.  She still has GI issues with diarrhea.  Problems Prior to Update: 1)  Deep Venous Thrombophlebitis, Recurrent, Hx of  (ICD-453.40) 2)  Syncope, Hx of  (ICD-V12.49) 3)  Personal History of Malignant Melanoma of Skin  (ICD-V10.82) 4)  Arm Pain, Left  (ICD-729.5) 5)  Injury Unspec Nerve Shoulder Girdle&upper Limb  (ICD-955.9) 6)  Abdominal Pain, Left Lower Quadrant  (ICD-789.04) 7)  Luq Pain  (ICD-789.02) 8)  ? of Hypoglycemia  (ICD-251.2) 9)  Cpk, Abnormal  (ICD-790.5) 10)  Neck Pain  (ICD-723.1) 11)  Numbness, Arm  (ICD-782.0) 12)  Pacemaker, Permanent  (ICD-V45.01) 13)  Dysautonomia Pots  (ICD-742.8) 14)  Arm Pain, Left  (ICD-729.5) 15)  Swelling of Limb  (ICD-729.81) 16)  Osteopenia  (ICD-733.90)  Medications Prior to Update: 1)  Catapres-Tts-2 0.2 Mg/24hr  Ptwk (Clonidine Hcl) 2)  Catapres 0.1 Mg  Tabs (Clonidine Hcl) .Marland Kitchen.. 1 By  Mouth At Bedtime 3)  Wellbutrin Xl 150 Mg  Tb24 (Bupropion Hcl) .Marland Kitchen.. 1 By Mouth Once Daily 4)  Phenobarbital 64.8 Mg  Tabs (Phenobarbital) .Marland Kitchen.. 1 By Mouth At Bedtime 5)  Lopressor 50 Mg  Tabs (Metoprolol Tartrate) .Marland Kitchen.. 1 By Mouth Three Times A Day 6)  Zofran 8 Mg  Tabs (Ondansetron Hcl) .... As Needed 7)  Aspirin 81 Mg  Tabs (Aspirin) 8)  Klor-Con M20 20 Meq  Tbcr (Potassium Chloride Crys Cr) .... 4 By Mouth Once Daily 9)  Protonix 40 Mg  Tbec (Pantoprazole Sodium) .Marland Kitchen.. 1 By Mouth Once Daily 10)  Labetalol Hcl 200 Mg  Tabs (Labetalol Hcl) .Marland Kitchen.. 1 By Mouth Two Times A Day 11)  Zocor 20 Mg  Tabs (Simvastatin) .Marland Kitchen.. 1 By Mouth Once Daily 12)  Methocarbamol 500 Mg  Tabs (Methocarbamol) .Marland Kitchen.. 1 By Mouth Three Times A Day As Needed Muscle Spasm 13)  Iv Hydration Once Weekly 14)  Vicodin 5-500 Mg Tabs (Hydrocodone-Acetaminophen) .... Take 1 Tab Every 6 Hours As Needed For Pain  Current Medications (verified): 1)  Catapres-Tts-2 0.2 Mg/24hr  Ptwk (Clonidine Hcl) 2)  Catapres 0.1 Mg  Tabs (Clonidine Hcl) .Marland Kitchen.. 1 By Mouth At Bedtime 3)  Wellbutrin Xl 150 Mg  Tb24 (Bupropion Hcl) .Marland Kitchen.. 1 By Mouth Once Daily 4)  Phenobarbital 64.8 Mg  Tabs (Phenobarbital) .Marland Kitchen.. 1 By Mouth At  Bedtime 5)  Lopressor 50 Mg  Tabs (Metoprolol Tartrate) .Marland Kitchen.. 1 By Mouth Three Times A Day 6)  Zofran 8 Mg  Tabs (Ondansetron Hcl) .... As Needed 7)  Aspirin 81 Mg  Tabs (Aspirin) .Marland Kitchen.. 1 Tab Once Daily 8)  Klor-Con M20 20 Meq  Tbcr (Potassium Chloride Crys Cr) .... 4 By Mouth Once Daily 9)  Protonix 40 Mg  Tbec (Pantoprazole Sodium) .... As Needed 10)  Labetalol Hcl 200 Mg  Tabs (Labetalol Hcl) .Marland Kitchen.. 1 By Mouth Two Times A Day 11)  Simvastatin 10 Mg Tabs (Simvastatin) .... Take One Tablet By Mouth Daily At Bedtime 12)  Methocarbamol 500 Mg  Tabs (Methocarbamol) .Marland Kitchen.. 1 By Mouth Three Times A Day As Needed Muscle Spasm 13)  Iv Hydration Once Weekly  Allergies (verified): 1)  ! Penicillin 2)  ! Sulfa 3)  ! Doxycycline 4)  !  Erythromycin  Past History:  Past Medical History: Last updated: 08/09/2008 HISTORY OF MALIGNANT MELANOMA OF SKIN (ICD-V10.82) ARM PAIN, LEFT (ICD-729.5) INJURY UNSPEC NERVE SHOULDER GIRDLE&UPPER LIMB (ICD-955.9) ABDOMINAL PAIN, LEFT LOWER QUADRANT (ICD-789.04) LUQ PAIN (ICD-789.02) ? 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 Nasal surgeries Melanoma removal  Family History: Last updated: 09/26/2007 Family History High cholesterol Family History Hypertension cad, cva parents    Social History: Last updated: 08/09/2008 Married remote smoker  Alcohol use-no self employed   Vital Signs:  Patient profile:   55 year old female Menstrual status:  postmenopausal Height:      63 inches Weight:      152 pounds Pulse rate:   77 / minute BP sitting:   134 / 88  (left arm)  Vitals Entered By: Burnett Kanaris, CNA (August 13, 2008 4:20 PM)  Physical Exam  General:  Well developed, well nourished, in no acute distress. Head:  normocephalic and atraumatic Neck:  JVP is normal.  No bruits. Lungs:  Clear bilaterally to auscultation and percussion. Heart:  RRR. S2, S2.  No S3.  No murmurs. Abdomen:  Supple.  normal bowel sounds.  No masses.  No hepatomegaly Pulses:  pulses normal in all 4 extremities Extremities:  No edema.  Feet sl cool.  R shoulder scar healing well.  Small scar L leg.   Impression & Recommendations:  Problem # 1:  PERSONAL HISTORY OF MALIGNANT MELANOMA OF SKIN (ICD-V10.82) Patient has had 2  lesions removed.  SHe is now being  evaluated at [facility].  Question immune defect to explain 2 episodes.  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  She is due to go to [facility] to see B. Berneice Gandy. Await recommendations.  Problem # 3:  PACEMAKER, PERMANENT (ICD-V45.01) Interrogation today showed episdes of atrial tach (over 700).  Most are very short, less than a 1/2 minute.  A few of the recorded are 1-3 minutes.  SHe questions if she can be paced out of atrial rhythms.  Wil discuss with Odessa Fleming.  Appended Document: palps  needs pacer checked  jr 12 lead EKG:  Sinus rhythm 77 bpm.

## 2010-03-30 NOTE — Assessment & Plan Note (Signed)
Summary: add-on per dr Barkley Bruns  Medications Added VITAMIN D 400 UNIT  TABS (CHOLECALCIFEROL) 1 by mouth once daily * VITAMIN B12 INJECTION once weekly      Allergies Added:   Visit Type:  Follow-up Referring Provider:  Berton Mount, MD Primary Provider:  Berniece Andreas MD  CC:  atrial tachycardia.  History of Present Illness: [name] presents today for EP follow-up today to discuss recent suggestions made by Dr [name].  Upon being seen in his office several weeks ago, the patient was prescribed Multaq therapy.  She wishes to discuss this locally in our office.  I have been asked by Dr Graciela Husbands to see her in his absence today.  The patient reports ongoing palpitations (lasting several seconds but occuring multiple times per day).  She also has longstanding dysautonomia.  She reports passing out last wednesday evening.  She is aware that she should not drive for at least 6 months after syncope.  She denies sustained palpitations, CP, sob, orthopnea, PND, or other symptoms today.  Current Medications (verified): 1)  Catapres-Tts-2 0.2 Mg/24hr  Ptwk (Clonidine Hcl) 2)  Catapres 0.1 Mg  Tabs (Clonidine Hcl) .Marland Kitchen.. 1 By Mouth At Bedtime 3)  Wellbutrin Xl 150 Mg  Tb24 (Bupropion Hcl) .Marland Kitchen.. 1 By Mouth Once Daily 4)  Phenobarbital 64.8 Mg  Tabs (Phenobarbital) .Marland Kitchen.. 1 By Mouth At Bedtime 5)  Lopressor 50 Mg  Tabs (Metoprolol Tartrate) .Marland Kitchen.. 1 By Mouth Three Times A Day 6)  Zofran 8 Mg  Tabs (Ondansetron Hcl) .... As Needed 7)  Aspirin 81 Mg  Tabs (Aspirin) .Marland Kitchen.. 1 Tab Once Daily 8)  Klor-Con M20 20 Meq  Tbcr (Potassium Chloride Crys Cr) .... 4 By Mouth Once Daily 9)  Protonix 40 Mg  Tbec (Pantoprazole Sodium) .... As Needed 10)  Labetalol Hcl 200 Mg  Tabs (Labetalol Hcl) .Marland Kitchen.. 1 By Mouth Two Times A Day 11)  Simvastatin 10 Mg Tabs (Simvastatin) .... Take One Tablet By Mouth Daily At Bedtime 12)  Methocarbamol 500 Mg  Tabs (Methocarbamol) .Marland Kitchen.. 1 By Mouth Three Times A Day As Needed Muscle  Spasm 13)  Iv Hydration Once Weekly 14)  Vitamin D 400 Unit  Tabs (Cholecalciferol) .Marland Kitchen.. 1 By Mouth Once Daily 15)  Vitamin B12 Injection .... Once Weekly  Allergies (verified): 1)  ! Penicillin 2)  ! Sulfa 3)  ! Doxycycline 4)  ! Erythromycin  Past History:  Past Medical History: HISTORY OF MALIGNANT MELANOMA OF SKIN (ICD-V10.82) ARM PAIN, LEFT (ICD-729.5) INJURY UNSPEC NERVE SHOULDER GIRDLE&UPPER LIMB (ICD-955.9) ABDOMINAL PAIN, LEFT LOWER QUADRANT (ICD-789.04) LUQ PAIN (ICD-789.02) ? 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 PACEMAKER, PERMANENT (ICD-V45.01) Fatty tumors removed, chest wall. Nasal surgeries Melanoma removal  Social History: Reviewed history from 08/09/2008 and no changes required. Married remote smoker  Alcohol use-no self employed   Review of Systems       All systems are reviewed and negative except as listed in the HPI.   Vital Signs:  Patient profile:   55 year old female Menstrual status:  postmenopausal Height:      63 inches Weight:      153 pounds BMI:     27.20  Pulse rate:   76 / minute Pulse rhythm:   regular BP sitting:   102 / 72 Cuff size:   regular  Vitals Entered By: Flonnie Overman (September 16, 2008 2:10 PM)  Physical Exam  General:  Well developed, well nourished, in no acute distress. Head:  normocephalic and atraumatic Eyes:  conjunctiva pink, no icterus.  Nose:  no external erythema and no nasal discharge.   Mouth:  No deformity or lesions, dentition normal. Neck:  JVP is normal.  No bruits. Chest Wall:  pacemaker site well  healed Lungs:  Clear bilaterally to auscultation and percussion. Heart:  RRR. S2, S2.  No S3.  No murmurs. Abdomen:  Supple.  normal bowel sounds.  No masses.  No hepatomegaly Pulses:  pulses normal in all 4 extremities Extremities:  No edema.  Feet sl cool.  R shoulder scar healing well.  Small scar L leg. Neurologic:  alert & oriented X3 and gait normal.  Skin:  Intact without lesions or rashes. Cervical Nodes:  No lymphadenopathy noted Psych:  Alert and cooperative. Normal mood and affect.  MD Comments:  device interrogation today reveals multiple episodes of atrial tachycadia, lasting 4seconds to 1 min 44 seconds.  No other arrhythmias are identified.  Impression & Recommendations:  Problem # 1:  ATRIAL TACHYCARDIA (ICD-427.89) The patient has frequent, nonsustained episodes of tachycardia which are not amenable to ablation.  She has failed medical therapy with multiple medications in the past.  She presently wonders if Multaq would be appropriate for her atrial arrhythmias as suggested by Dr [name]. I spent more than 30 minutes in discussion with the patient today and have reviewed her chart in detail.   I have informed the patient that Mutlaq would be a reasonable consideration as she has failed other antiarrhythmics in the past.  She is aware that longterm safety data does not exist for this medicine, though initial studies appear to suggest a reasonable safety profile.  She will further contemplate this medication as an option.  I have recommended that she continue to follow closely with Dr Graciela Husbands regarding this matter. I will be happy to answer any questions that may arise while he is on vacation.  Her updated medication list for this problem includes:    Lopressor 50 Mg Tabs (Metoprolol tartrate) .Marland Kitchen... 1 by mouth three times a day    Aspirin 81 Mg Tabs (Aspirin) .Marland Kitchen... 1 tab once daily    Labetalol Hcl 200 Mg Tabs (Labetalol hcl) .Marland Kitchen... 1 by mouth two times a day  Problem # 2:   SYNCOPE, HX OF (ICD-V12.49) The patient has a h/o dysautonomia and syncope.  I have advised her to not drive for 6 months given her recent syncope.  I have also advised that she maintain aggressive hydration.  No medication changes were made today.  She will continue to follow closely with Dr Graciela Husbands and Dr Berneice Gandy.  Patient Instructions: 1)  Your physician recommends that you schedule a follow-up appointment in: 6 weeks with Dr. Graciela Husbands

## 2010-03-30 NOTE — Miscellaneous (Signed)
  Clinical Lists Changes  Observations: Added new observation of PPM INDICATN: Syncope (06/17/2008 17:15) Added new observation of PPMLEADSTAT2: active (06/17/2008 17:15) Added new observation of PPMLEADSER2: [id number] (06/17/2008 17:15) Added new observation of PPMLEADMOD2: 5092  (06/17/2008 17:15) Added new observation of PPMLEADDOI2: 05/04/1997  (06/17/2008 17:15) Added new observation of PPMLEADLOC2: RV  (06/17/2008 17:15) Added new observation of PPMLEADSTAT1: active  (06/17/2008 17:15) Added new observation of PPMLEADSER1: [id number]  (06/17/2008 17:15) Added new observation of PPMLEADMOD1: 1342T  (06/17/2008 17:15) Added new observation of PPMLEADDOI1: 05/04/1997  (06/17/2008 17:15) Added new observation of PPMLEADLOC1: RA  (06/17/2008 17:15) Added new observation of PPM IMP MD: Sherryl Manges, MD  (06/17/2008 17:15) Added new observation of PPM DOI: 11/09/2003  (06/17/2008 17:15) Added new observation of PPM SERL#: [id number]  (06/17/2008 17:15) Added new observation of PPM MODL#: P150DR  (06/17/2008 59:56) Added new observation of PACEMAKERMFG: Medtronic  (06/17/2008 17:15) Added new observation of PACEMAKER MD: Sherryl Manges, MD  (06/17/2008 17:15)      PPM Specifications Following MD:  Sherryl Manges, MD     PPM Vendor:  Medtronic     PPM Model Number:  [id number]     PPM Serial Number:  [id number] PPM DOI:  11/09/2003     PPM Implanting MD:  Sherryl Manges, MD  Lead 1    Location: RA     DOI: 05/04/1997     Model #: [id number]     Serial #: [id number]     Status: active Lead 2    Location: RV     DOI: 05/04/1997     Model #: [id number]     Serial #: [id number]     Status: active   Indications:  Syncope

## 2010-03-30 NOTE — Procedures (Signed)
Summary: [facility] EGD   EGD  Procedure date:  05/25/2002  Findings:      Location: [facility]     Patient Name: Amanda, Barrera MRN: [id number] Procedure Procedures: Panendoscopy (EGD) CPT: 43235.  Personnel: Endoscopist: Barbette Hair. Arlyce Dice, MD.  Indications  Abnormal Exams, Studies: CT scan, abnormal.  History  Pre-Exam Physical: Performed May 25, 2002  Entire physical exam was normal.  Exam Exam Info: Maximum depth of insertion Duodenum, intended Duodenum. Vocal cords visualized. Gastric retroflexion performed. ASA Classification: II. Tolerance: good.  Sedation Meds: Robinul 0.2 given IV. Fentanyl 50 mcg. given IV. Versed 5 mg. given IV. Cetacaine Spray 2 sprays given aerosolized.  Monitoring: BP and pulse monitoring done. Oximetry used. Supplemental O2 given at 2 Liters.  Findings - Normal: Proximal Esophagus to Duodenal 2nd Portion.  - Normal: Duodenal Bulb to Duodenal 2nd Portion.   Assessment Normal examination.  Events  Unplanned Intervention: No unplanned interventions were required.  Unplanned Events: There were no complications. Plans Patient Education: Patient given standard instructions for: a normal exam.  Scheduling: Office Visit, to Barbette Hair. Arlyce Dice, MD, around Jun 25, 2002.    cc: Duke Salvia MD  This report was created from the original endoscopy report, which was reviewed and signed by the above listed endoscopist.

## 2010-03-30 NOTE — Cardiovascular Report (Signed)
Summary: Office Visit   Office Visit   Imported By: Roderic Ovens 11/08/2009 11:14:34  _____________________________________________________________________  External Attachment:    Type:   Image     Comment:   External Document

## 2010-03-30 NOTE — Miscellaneous (Signed)
Summary: Home Health Certification/Care Plan  Home Health Certification/Care Plan   Imported By: Roderic Ovens 06/22/2009 16:34:29  _____________________________________________________________________  External Attachment:    Type:   Image     Comment:   External Document

## 2010-03-30 NOTE — Letter (Signed)
Summary: [facility]  [facility]   Imported By: Maryln Gottron 04/29/2008 11:00:12  _____________________________________________________________________  External Attachment:    Type:   Image     Comment:   External Document

## 2010-03-30 NOTE — Letter (Signed)
Summary: [facility]   Imported By: Lanelle Bal 02/10/2008 11:44:05  _____________________________________________________________________  External Attachment:    Type:   Image     Comment:   External Document

## 2010-03-30 NOTE — Letter (Signed)
Summary: [facility]  [facility]   Imported By: Debby Freiberg 08/26/2009 09:18:57  _____________________________________________________________________  External Attachment:    Type:   Image     Comment:   External Document

## 2010-03-30 NOTE — Progress Notes (Signed)
Summary: Knot on Leg  Phone Note Call from Patient   Summary of Call: Called c/o knot on leg Initial call taken by: Suzan Garibaldi RN  Follow-up for Phone Call        Pt. called, states that she noticed a 2 inch knot under her skin below her knee, left leg..it is a little discolored and swollen. Discussed this situation with Dr.Ross who advised that she contact her primary care doctor to be seen. She agreed to this plan. Follow-up by: Suzan Garibaldi RN

## 2010-03-30 NOTE — Procedures (Signed)
Summary: pacer check   Current Medications (verified): 1)  Catapres-Tts-2 0.2 Mg/24hr  Ptwk (Clonidine Hcl) .... Once A Week 2)  Catapres 0.1 Mg  Tabs (Clonidine Hcl) .Marland Kitchen.. 1 By Mouth At Bedtime 3)  Wellbutrin Xl 150 Mg  Tb24 (Bupropion Hcl) .Marland Kitchen.. 1 By Mouth Once Daily 4)  Zofran 8 Mg  Tabs (Ondansetron Hcl) .... As Needed 5)  Aspirin 81 Mg  Tabs (Aspirin) .Marland Kitchen.. 1 Tab Once Daily 6)  Klor-Con M20 20 Meq  Tbcr (Potassium Chloride Crys Cr) .... 4 By Mouth Once Daily 7)  Protonix 40 Mg  Tbec (Pantoprazole Sodium) .... As Needed 8)  Labetalol Hcl 200 Mg Tabs (Labetalol Hcl) .... One By Mouth Two Times A Day 9)  Simvastatin 10 Mg Tabs (Simvastatin) .... Take One Tablet By Mouth Daily At Bedtime 10)  Iv Hydration Once Weekly 11)  Vitamin B12 Injection .... Once Weekly 12)  Miralax  Powd (Polyethylene Glycol 3350) .... Take 1/2 Capful Dissolved in At Least 8 Ounces of Water/juice Once Daily. 13)  Metoprolol Succinate 100 Mg Xr24h-Tab (Metoprolol Succinate) .... Take One and A Half Tablets Every Day 14)  Metoprolol Tartrate 50 Mg Tabs (Metoprolol Tartrate) .... As Needed  Allergies (verified): 1)  ! Penicillin 2)  ! Sulfa 3)  ! Doxycycline 4)  ! Erythromycin  PPM Specifications Following MD:  Sherryl Manges, MD     Referring MD:  ROSS PPM Vendor:  [employer]tronic     PPM Model Number:  [id number]     PPM Serial Number:  [id number] PPM DOI:  11/09/2003     PPM Implanting MD:  Sherryl Manges, MD  Lead 1    Location: RA     DOI: 05/04/1997     Model #: 1342T     Serial #: [id number]     Status: active Lead 2    Location: RV     DOI: 05/04/1997     Model #: [id number]     Serial #: [id number]     Status: active  Magnet Response Rate:  BOL 85 ERI  65  Indications:  Syncope   PPM Follow Up Remote Check?  No Battery Voltage:  2.99 V     Pacer Dependent:  No       PPM Device Measurements Atrium  Amplitude: 4.2 mV, Impedance: 392 ohms, Threshold: 0.5 V at 0.4 msec Right Ventricle  Amplitude: 3.5 mV, Impedance: 544  ohms, Threshold: 1.0 V at 0.4 msec  Episodes MS Episodes:  0     Percent Mode Switch:  0     Coumadin:  No Ventricular High Rate:  1     Atrial Pacing:  15.8%     Ventricular Pacing:  0.2%  Parameters Mode:  MVP     Lower Rate Limit:  60     Upper Rate Limit:  130 Paced AV Delay:  250     Sensed AV Delay:  200 Tech Comments:  [name] was seen today as an add on for an episode 8/19 that woke her and lasted for hours.  She had a total of 353 fast AV episodes alll 1:1conduction at rates 136-150bpm.  There was one recorded episode on 8/19 that was 36 seconds in duration.  1VHR episode was noted back on 5/24 6 beat duration.  She will schedule a follow up with Dr. Graciela Husbands. Altha Harm, LPN  October 17, 2009 1:31 PM

## 2010-03-30 NOTE — Miscellaneous (Signed)
[facility]   Imported By: Marylou Mccoy 02/24/2009 10:10:22  _____________________________________________________________________  External Attachment:    Type:   Image     Comment:   External Document

## 2010-03-30 NOTE — Miscellaneous (Signed)
Summary: Levsin SL   Clinical Lists Changes  Medications: Added new medication of LEVSIN/SL 0.125 MG  SUBL (HYOSCYAMINE SULFATE) 1 SL before meals as needed for abd pain - Signed Rx of LEVSIN/SL 0.125 MG  SUBL (HYOSCYAMINE SULFATE) 1 SL before meals as needed for abd pain;  #30 x 1;  Signed;  Entered by: Georga Bora;  Authorized by: Hart Carwin MD;  Method used: Electronically to [facility] # [address]*, [address], [place], [name]  [address], Ph: [phone], Fax: [phone]    Prescriptions: LEVSIN/SL 0.125 MG  SUBL (HYOSCYAMINE SULFATE) 1 SL before meals as needed for abd pain  #30 x 1   Entered by:   Georga Bora   Authorized by:   Hart Carwin MD   Signed by:   Georga Bora on 03/24/2009   Method used:   Electronically to        [facility] # [address] (retail)       [address]       [place], [name]  [address]       Ph: [phone]       Fax: [phone]   RxID:   [id number]

## 2010-03-30 NOTE — Progress Notes (Signed)
Summary: ? after CT   Phone Note Call from Patient Call back at Work Phone [phone]   Caller: Patient Call For: BRODIE Reason for Call: Talk to Nurse Details for Reason: ? after CT Summary of Call: ? re: after the CT (sch this morning ) prefers to s/w Pam - Initial call taken by: Guadlupe Spanish [facility],  December 10, 2007 8:27 AM  Follow-up for Phone Call        Pt asked what she can eat currently.  i advised her per Gunnar Fusi for her to say on low residue diet.  Avoid red meats, raw vegetables, roughage. She can eat yogart, soups, cooked vegetables, no pulp juices, dairy.  Pt thanked me and asked [name] to call her with CT results when they are available.  Follow-up by: Lowry Ram CMA,  December 10, 2007 10:16 AM

## 2010-03-30 NOTE — Miscellaneous (Signed)
Summary: Home Health Certification  Home Health Certification   Imported By: Roderic Ovens 08/23/2008 12:45:54  _____________________________________________________________________  External Attachment:    Type:   Image     Comment:   External Document

## 2010-03-30 NOTE — Assessment & Plan Note (Signed)
Summary: PC2   Visit Type:  PPM Medtronic Referring Provider:  n/a Primary Provider:  Berniece Andreas MD  CC:  no complains.  History of Present Illness: [name] is seen in followup for pacer implanted for  in the setting of  hyperadrenergic POTS. She also had a history of exercise induced tachycardia which was limited and in [place] was given a recommendation to try dronaderone which she hfailed to tolerate.  Amanda. [name] is seen at her request a follow relatively long hiatus where it she has been followed by Dr. Dietrich Pates Dr. Fabian Sharp and Dr. Berneice Gandy in [place] for a constellation of this are not symptoms recently identified diabetes hypertension and positive persistent exercise-induced tachycardia which has been limiting. She recently went to [facility] and came back with a recommendation to start dronaderone which we then did after discussions about the potential risks. She comes in today in followup for that. She had water pills with palpitations. The dronaderone has been associated with nausea and diarrhea. She has awakened on one occasion with palpitations.   She continues to have post exercise palpitations.  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 of Hypoglycemia  (ICD-251.2) 17)  Cpk, Abnormal  (ICD-790.5) 18)  Neck  Pain  (ICD-723.1) 19)  Numbness, Arm  (ICD-782.0) 20)  Dysautonomia Pots  (ICD-742.8) 21)  Arm Pain, Left  (ICD-729.5) 22)  Swelling of Limb  (ICD-729.81) 23)  Osteopenia  (ICD-733.90)  Current Medications (verified): 1)  Catapres-Tts-2 0.2 Mg/24hr  Ptwk (Clonidine Hcl) .... Once A Week 2)  Catapres 0.1 Mg  Tabs (Clonidine Hcl) .Marland Kitchen.. 1 By Mouth At Bedtime 3)  Wellbutrin Xl 150 Mg  Tb24 (Bupropion Hcl) .Marland Kitchen.. 1 By Mouth Once Daily 4)  Zofran 8 Mg  Tabs (Ondansetron Hcl) .... As Needed 5)  Aspirin 81 Mg  Tabs (Aspirin) .Marland Kitchen.. 1 Tab Once Daily 6)  Klor-Con M20 20 Meq  Tbcr (Potassium Chloride Crys Cr) .... 4 By Mouth Once Daily 7)  Protonix 40 Mg  Tbec (Pantoprazole Sodium) .... As Needed 8)  Labetalol Hcl 200 Mg Tabs (Labetalol Hcl) .... One By Mouth Two Times A Day 9)  Simvastatin 10 Mg Tabs (Simvastatin) .... Take One Tablet By Mouth Daily At Bedtime 10)  Iv Hydration Once Weekly 11)  Vitamin B12 Injection .... Once A Month 12)  Miralax  Powd (Polyethylene Glycol 3350) .... Take 1/2 Capful Dissolved in At Least 8 Ounces of Water/juice Once Daily. 13)  Metoprolol Succinate 100 Mg Xr24h-Tab (Metoprolol Succinate) .... Take One and A Half Tablets Every Day 14)  Metoprolol Tartrate 50 Mg Tabs (Metoprolol Tartrate) .... As Needed 15)  Vitamin D3 50000 Unit Caps (Cholecalciferol) .Marland Kitchen.. 1 Cap Every Day  Allergies (verified): 1)  ! Penicillin 2)  ! Sulfa 3)  ! Doxycycline 4)  ! Erythromycin  Past History:  Past Medical History: Last updated: 11/01/2009 HISTORY OF MALIGNANT MELANOMA OF SKIN (ICD-V10.82) ARM PAIN, LEFT (ICD-729.5) INJURY UNSPEC NERVE SHOULDER GIRDLE&UPPER LIMB (ICD-955.9) diverticulitis ? of HYPOGLYCEMIA (ICD-251.2) CPK, ABNORMAL (ICD-790.5) NECK PAIN (ICD-723.1) NUMBNESS, ARM (ICD-782.0) DYSAUTONOMIA POTS (ICD-742.8) SWELLING OF LIMB (ICD-729.81) OSTEOPENIA (ICD-733.90)  ATRIAL TACHYCARDIA nasal surgery for nasal bleeding   Melanoma in Rt arm and had lymphonodes  removed 02/16/08 level4  Melanoma in Rt leg 06/21/08 Breslow 2  Vitamin D Deficiency  Past Surgical History: Last updated: 01/31/2009 PACEMAKER, PERMANENT (ICD-V45.01)-placed 1999 RF Ablation-2002 Fatty tumors removed, chest wall. Nasal surgeries Melanoma removal T & A Carpal Tunnel Release  Family History: Last updated: 04/20/2009 Family History High cholesterol Family History Hypertension cad, cva parents  Family History of Diabetes: Maternal Grandmother, Paternal Grandmother Family History of Heart Disease: Mother, Father No FH of Colon Cancer:  Social History: Last updated: 11/01/2009 Married no children  Alcohol use-no self employed  Patient has never smoked.  Illicit Drug Use - no  Risk Factors: Alcohol Use: <1 (01/19/2009) Caffeine Use: 0 (01/19/2009) Exercise: yes (01/19/2009)  Risk Factors: Smoking Status: never (11/01/2009)  Vital Signs:  Patient profile:   55 year old female Menstrual status:  postmenopausal Height:      63 inches Weight:      148.25 pounds BMI:     26.36 Pulse rate:   84 / minute BP sitting:   125 / 87  (left arm)  Vitals Entered By: Caralee Ates CMA (December 27, 2009 11:29 AM)  Physical Exam  General:  The patient was alert and oriented in no acute distress. HEENT Normal.  Neck veins were flat, carotids were brisk.  Lungs were clear.  Heart sounds were regular without murmurs or gallops.  Abdomen was soft with active bowel sounds. There is no clubbing cyanosis or edema. Skin Warm and dry    PPM Specifications Following MD:  Sherryl Manges, MD     Referring MD:  ROSS PPM Vendor:  Medtronic     PPM Model Number:  [id number]     PPM Serial Number:  [id number] PPM DOI:  11/09/2003     PPM Implanting MD:  Sherryl Manges, MD  Lead 1    Location: RA     DOI: 05/04/1997     Model #: [id number]     Serial #: [id number]     Status: active Lead 2    Location: RV     DOI: 05/04/1997     Model #: [id number]     Serial #: [id number]     Status:  active  Magnet Response Rate:  BOL 85 ERI  65  Indications:  Syncope   PPM Follow Up Battery Voltage:  2.97 V     Pacer Dependent:  No       PPM Device Measurements Atrium  Amplitude: 5.5 mV, Impedance: 424 ohms, Threshold: 1.0 V at 0.4 msec Right Ventricle  Amplitude: 3.1 mV, Impedance: 584 ohms, Threshold: 0.5 V at 0.4 msec  Episodes Coumadin:  No Ventricular High Rate:  153     Atrial Pacing:  17.7%     Ventricular Pacing:  0.1%  Parameters Mode:  MVP     Lower Rate Limit:  60     Upper Rate Limit:  130 Paced AV Delay:  250     Sensed AV Delay:  200 Next Cardiology Appt Due:  06/27/2010 Tech Comments:  153 SVT EPISODES--LONGEST WAS 1 MIN 26 SECONDS.  NORMAL DEVICE FUNCTION.  NO CHANGES MADE. ROV  IN 6 MTHS W/DEVICE CLINIC. Vella Kohler  December 27, 2009 11:35 AM  Impression & Recommendations:  Problem # 1:  DYSAUTONOMIA-POTS (ICD-742.8) we discussed a number of maneuvers potentially mitigate some of her post exercise palpitations. This would include putting on support stockings at that point as well as continuing to. do isometric exercises or isometric medic exercises with her legs while seated at her desk.  Problem # 2:  PACEMAKER MDT DDD (ICD-V45.01) Device parameters and data were reviewed and no changes were made

## 2010-03-30 NOTE — Procedures (Signed)
Summary: tachycardia/jr   Current Medications (verified): 1)  Catapres-Tts-2 0.2 Mg/24hr  Ptwk (Clonidine Hcl) .... Once A Week 2)  Catapres 0.1 Mg  Tabs (Clonidine Hcl) .Marland Kitchen.. 1 By Mouth At Bedtime 3)  Wellbutrin Xl 150 Mg  Tb24 (Bupropion Hcl) .Marland Kitchen.. 1 By Mouth Once Daily 4)  Zofran 8 Mg  Tabs (Ondansetron Hcl) .... As Needed 5)  Aspirin 81 Mg  Tabs (Aspirin) .Marland Kitchen.. 1 Tab Once Daily 6)  Klor-Con M20 20 Meq  Tbcr (Potassium Chloride Crys Cr) .... 4 By Mouth Once Daily 7)  Protonix 40 Mg  Tbec (Pantoprazole Sodium) .... As Needed 8)  Labetalol Hcl 200 Mg Tabs (Labetalol Hcl) .... One By Mouth Two Times A Day 9)  Simvastatin 10 Mg Tabs (Simvastatin) .... Take One Tablet By Mouth Daily At Bedtime 10)  Iv Hydration Once Weekly 11)  Vitamin B12 Injection .... Once A Month 12)  Miralax  Powd (Polyethylene Glycol 3350) .... Take 1/2 Capful Dissolved in At Least 8 Ounces of Water/juice Once Daily. 13)  Metoprolol Succinate 100 Mg Xr24h-Tab (Metoprolol Succinate) .... Take One and A Half Tablets Every Day 14)  Metoprolol Tartrate 50 Mg Tabs (Metoprolol Tartrate) .... As Needed 15)  Vitamin D3 50000 Unit Caps (Cholecalciferol) .Marland Kitchen.. 1 Cap Every Day 16)  Phenobarbital 64.8 Mg Tabs (Phenobarbital) .Marland Kitchen.. 1 Tab At Bedtime  Allergies (verified): 1)  ! Penicillin 2)  ! Sulfa 3)  ! Doxycycline 4)  ! Erythromycin  PPM Specifications Following MD:  Sherryl Manges, MD     Referring MD:  ROSS PPM Vendor:  Medtronic     PPM Model Number:  [id number]     PPM Serial Number:  [id number] PPM DOI:  11/09/2003     PPM Implanting MD:  Sherryl Manges, MD  Lead 1    Location: RA     DOI: 05/04/1997     Model #: [id number]     Serial #: [id number]     Status: active Lead 2    Location: RV     DOI: 05/04/1997     Model #: [id number]     Serial #: [id number]     Status: active  Magnet Response Rate:  BOL 85 ERI  65  Indications:  Syncope   PPM Follow Up Battery Voltage:  2.97 V     Pacer Dependent:  No       PPM Device  Measurements Atrium  Impedance: 384 ohms,  Right Ventricle  Impedance: 560 ohms,   Episodes MS Episodes:  0     Coumadin:  No Ventricular High Rate:  307     Atrial Pacing:  8.4%     Ventricular Pacing:  0.1%  Parameters Mode:  MVP     Lower Rate Limit:  60     Upper Rate Limit:  130 Paced AV Delay:  250     Sensed AV Delay:  200 Tech Comments:  INTERROGATION ONLY---PT HAVING FAST HEART RATES.  DR Tenny Craw SAW PT AS WELL AND INCREASED METOPROLOL.  FOLLOWUP AS PLANNED.  Vella Kohler  March 22, 2010 3:44 PM Prescriptions: ALPRAZOLAM 0.25 MG TABS (ALPRAZOLAM) take one half a tab 2 times per day as needed for anxiety  #10 x 0   Entered by:   Layne Benton, RN, BSN   Authorized by:   Marland Kitchen LB CHURCHDEFAULT   Signed by:   Layne Benton, RN, BSN on 03/17/2010   Method used:   Print then Give to Patient   RxID:   [id number]

## 2010-03-30 NOTE — Procedures (Signed)
Summary: [facility] COLON   Colonoscopy  Procedure date:  08/13/2006  Findings:      Location:  [facility].    Procedures Next Due Date:    Colonoscopy: 07/2016   Patient Name: [name], [name] MRN: [id number] Procedure Procedures: Colonoscopy CPT: 47829.    with biopsy. CPT: Q5068410.  Personnel: Endoscopist: Dora L. Juanda Chance, MD.  Exam Location: Exam performed in Endoscopy Suite.  Patient Consent: Procedure, Alternatives, Risks and Benefits discussed, consent obtained, from patient. Consent was obtained by the RN.  Indications Symptoms: Diarrhea Patient is having increased frequency of stools. Abdominal pain / bloating.  Comments: pt has a POTs syndrome, unable to prep at home due to orthostatic changes and possible dehydration, she had an in patient prep History  Current Medications: Patient is not currently taking Coumadin.  Pre-Exam Physical: Performed Aug 13, 2006.  Comments: Pt. history reviewed/updated, physical exam performed prior to initiation of sedation?yes Exam Exam: Extent of exam reached: Cecum, extent intended: Cecum.  The cecum was identified by appendiceal orifice and IC valve. Duration of exam: out time 6:11 minutes. Colon retroflexion performed. Images taken. ASA Classification: II. Tolerance: good.  Monitoring: Pulse and BP monitoring, Oximetry used. Supplemental O2 given.  Colon Prep Used Miralax for colon prep. Prep results: good.  Sedation Meds: Patient assessed and found to be appropriate for moderate (conscious) sedation. Fentanyl 100 mcg. given IV. Versed 10 mg. given IV.  Findings DIAGNOSTIC TEST: Biopsies taken. from Transverse Colon. Reason: r/o microscopic colitis.  - NORMAL EXAM:  - DIVERTICULOSIS: Sigmoid Colon. ICD9: Diverticulosis: 562.10. Comments: moderate diverticulosis of the sigmoid colon, several deep wide mouth diverticuli.  - NORMAL EXAM: Rectum.   Assessment Abnormal examination, see findings above.   Diagnoses: 562.10: Diverticulosis.   Comments: no polyps, sigmoid diverticulosis Events  Unplanned Interventions: No intervention was required.  Unplanned Events: There were no complications. Plans Medication Plan: Await pathology.  Patient Education: Patient given standard instructions for: Patient instructed to get routine colonoscopy every 10 years.  Comments: may try antispasmodics such as Bentyl prn  diarrhea/abd.pain Disposition: After procedure patient sent to recovery.   FA:OZHYQ Tenny Craw, MD    Berniece Andreas, MD  This report was created from the original endoscopy report, which was reviewed and signed by the above listed endoscopist.

## 2010-03-30 NOTE — Letter (Signed)
[facility]   Imported By: Marylou Mccoy 09/20/2009 13:37:06  _____________________________________________________________________  External Attachment:    Type:   Image     Comment:   External Document

## 2010-03-30 NOTE — Consult Note (Signed)
Summary: [facility]  [facility]   Imported By: Esmeralda Links D'jimraou 08/07/2007 14:45:30  _____________________________________________________________________  External Attachment:    Type:   Image     Comment:   External Document

## 2010-03-30 NOTE — Procedures (Signed)
Summary: Pacer battery check/jr   Allergies: 1)  ! Penicillin 2)  ! Sulfa 3)  ! Doxycycline 4)  ! Erythromycin   PPM Specifications Following MD:  Sherryl Manges, MD     Referring MD:  ROSS PPM Vendor:  [employer]onic     PPM Model Number:  [id number]     PPM Serial Number:  [id number] PPM DOI:  11/09/2003     PPM Implanting MD:  Sherryl Manges, MD  Lead 1    Location: RA     DOI: 05/04/1997     Model #: 1342T     Serial #: [id number]     Status: active Lead 2    Location: RV     DOI: 05/04/1997     Model #: 1601     Serial #: [id number]     Status: active   Indications:  Syncope   PPM Follow Up Remote Check?  No Battery Voltage:  3.00 V     Pacer Dependent:  No       PPM Device Measurements Atrium  Amplitude: 4.4 mV, Impedance: 376 ohms, Threshold: 0.5 V at 0.4 msec Right Ventricle  Amplitude: 3.8 mV, Impedance: 520 ohms, Threshold: 0.5 V at 0.4 msec  Episodes MS Episodes:  0     Coumadin:  No Ventricular High Rate:  0     Atrial Pacing:  12.7%     Ventricular Pacing:  0%  Parameters Mode:  MVP     Lower Rate Limit:  60     Upper Rate Limit:  130 Paced AV Delay:  250     Sensed AV Delay:  200 Tech Comments:  Device check today at patient's request because she feels like she did when her previous device was at ERI.  Normal device function today.  88 fast A&V rates which are ST or atrial tach with 1:1 conduction.  Fastest rate 158bpm.  Pt reassured today.  She will try moving around time of beta-blocker to see if it helps with symptoms.  ROV as scheduled. Gypsy Balsam RN BSN  May 25, 2009 4:16 PM

## 2010-03-30 NOTE — Letter (Signed)
Summary: [facility]   Imported By: Roderic Ovens 10/04/2008 14:17:33  _____________________________________________________________________  External Attachment:    Type:   Image     Comment:   External Document

## 2010-03-30 NOTE — Assessment & Plan Note (Signed)
Summary: PER CHECK OUT/SF   Visit Type:  Follow-up pacer check Referring Provider:  Berniece Andreas MD Primary Provider:  Berniece Andreas MD   History of Present Illness: Patient is a 55 year old with a history of severe dysautonomia, tachycardia, diverticulosis.  She was last seen in clinic by Odessa Fleming in September. Since seen, she continues to ave problems with tachycardia.  Her pacer was interrogated today and shows bursts of tachycardia.  She denies syncope. She also complains of significant lower abdomenal pain. She is followed by D. Brodie.  Recent Flex Sig showed severe diverticulosis.  She was given an Rx for Bentyl for pain.  This helped but it caused her BP to go up significantly.  Current Medications (verified): 1)  Catapres-Tts-2 0.2 Mg/24hr  Ptwk (Clonidine Hcl) 2)  Catapres 0.1 Mg  Tabs (Clonidine Hcl) .Marland Kitchen.. 1 By Mouth At Bedtime 3)  Wellbutrin Xl 150 Mg  Tb24 (Bupropion Hcl) .Marland Kitchen.. 1 By Mouth Once Daily 4)  Phenobarbital 64.8 Mg  Tabs (Phenobarbital) .Marland Kitchen.. 1 By Mouth At Bedtime 5)  Lopressor 50 Mg  Tabs (Metoprolol Tartrate) .Marland Kitchen.. 1 By Mouth Three Times A Day 6)  Zofran 8 Mg  Tabs (Ondansetron Hcl) .... As Needed 7)  Aspirin 81 Mg  Tabs (Aspirin) .Marland Kitchen.. 1 Tab Once Daily 8)  Klor-Con M20 20 Meq  Tbcr (Potassium Chloride Crys Cr) .... 4 By Mouth Once Daily 9)  Protonix 40 Mg  Tbec (Pantoprazole Sodium) .... As Needed 10)  Labetalol Hcl 200 Mg Tabs (Labetalol Hcl) .... Take Two Tablets By Mouth Every Day 11)  Simvastatin 10 Mg Tabs (Simvastatin) .... Take One Tablet By Mouth Daily At Bedtime 12)  Iv Hydration Once Weekly 13)  Vitamin D 400 Unit  Tabs (Cholecalciferol) .Marland Kitchen.. 1 By Mouth Once Daily 14)  Vitamin B12 Injection .... Once Weekly 15)  Miralax  Powd (Polyethylene Glycol 3350) .... Take 1/2 Capful Dissolved in At Least 8 Ounces of Water/juice Once Daily 16)  Bentyl 20 Mg Tabs (Dicyclomine Hcl) .... Take 1 Tablet By Mouth As Needed For Abdominal Pain  (On Hold)) Per Pt  Allergies  (verified): 1)  ! Penicillin 2)  ! Sulfa 3)  ! Doxycycline 4)  ! Erythromycin  Past History:  Past Medical History: Last updated: 01/19/2009 HISTORY OF MALIGNANT MELANOMA OF SKIN (ICD-V10.82) ARM PAIN, LEFT (ICD-729.5) INJURY UNSPEC NERVE SHOULDER GIRDLE&UPPER LIMB (ICD-955.9) diverticulitis ? of HYPOGLYCEMIA (ICD-251.2) CPK, ABNORMAL (ICD-790.5) NECK PAIN (ICD-723.1) NUMBNESS, ARM (ICD-782.0) DYSAUTONOMIA POTS (ICD-742.8) SWELLING OF LIMB (ICD-729.81) OSTEOPENIA (ICD-733.90)  ATRIAL TACHYCARDIA nasal surgery for nasal bleeding   Melanoma in Rt arm and had lymphonodes removed 02/16/08 level4  Melanoma in Rt leg 06/21/08 Breslow 2   Past Surgical History: Last updated: 01/31/2009 PACEMAKER, PERMANENT (ICD-V45.01)-placed 1999 RF Ablation-2002 Fatty tumors removed, chest wall. Nasal surgeries Melanoma removal T & A Carpal Tunnel Release  Social History: Last updated: 08/09/2008 Married remote smoker  Alcohol use-no self employed   Vital Signs:  Patient profile:   55 year old female Menstrual status:  postmenopausal Height:      63 inches Weight:      151 pounds BMI:     26.85 Pulse rate:   73 / minute BP sitting:   117 / 81  (left arm) Cuff size:   regular  Vitals Entered By: Burnett Kanaris, CNA (April 08, 2009 4:09 PM)  Physical Exam  Additional Exam:  HEENT:  Normocephalic, atraumatic. EOMI, PERRLA.  Neck: JVP is normal. No thyromegaly. No bruits.  Lungs:  clear to auscultation. No rales no wheezes.  Heart: Regular rate and rhythm. Normal S1, S2. No S3.   No significant murmurs. PMI not displaced.  Abdomen: Tender in lower quadrants.  No masses. Normal bowel sounds..  Extremities:   Good distal pulses throughout. No lower extremity edema.  Musculoskeletal :moving all extremities.  Neuro:   alert and oriented x3.    PPM Specifications Following MD:  Sherryl Manges, MD     PPM Vendor:  Medtronic     PPM Model Number:  [id number]     PPM Serial Number:   [id number] PPM DOI:  11/09/2003     PPM Implanting MD:  Sherryl Manges, MD  Lead 1    Location: RA     DOI: 05/04/1997     Model #: 1342T     Serial #: [id number]     Status: active Lead 2    Location: RV     DOI: 05/04/1997     Model #: [id number]     Serial #: [id number]     Status: active   Indications:  Syncope   PPM Follow Up Remote Check?  No Battery Voltage:  3.00 V     Pacer Dependent:  No       PPM Device Measurements Atrium  Amplitude: 5.3 mV, Impedance: 392 ohms, Threshold: 0.5 V at 0.4 msec Right Ventricle  Amplitude: 4.5 mV, Impedance: 520 ohms, Threshold: 0.5 V at 0.4 msec  Episodes MS Episodes:  0     Percent Mode Switch:  0     Coumadin:  No Ventricular High Rate:  289 fast A/V     Atrial Pacing:  16.5%     Ventricular Pacing:  0.1%  Parameters Mode:  DDD+     Lower Rate Limit:  60     Upper Rate Limit:  130 Paced AV Delay:  250     Sensed AV Delay:  200 Tech Comments:  SVT episode 1/22 3:06 minutes.  Fastest AV rate 150/150.  No parameter changes.   Altha Harm, LPN  April 08, 2009 4:44 PM   Impression & Recommendations:  Problem # 1:  * POTS SYNDROME Patient followed by B. Grubb in [place].  Still with problems.   I have recommended trying to increase her lopressor (on 50 q 8 hrs).  Take addtional 25 mg at different intervals.  This my help control her BP and heart rate and allow her to take Bentyl for abdomenal pain.  Continue Catapress, Pheobarb, Wellbutrin, Labetalol  Problem # 2:  ATRIAL TACHYCARDIA (ICD-427.89) Will show to Durenda Age current pacer interrogation.    Keep on meds.  Problem # 3:  DIVERTICULOSIS, COLON, HX OF (ICD-V12.79) Patient with severe symptoms.  Leodis Binet is difficult.  I will forward to D. Brodie for suggestions.  Told her to increase Lopresser which may allow her to take Bentyl.  Appended Document: PER CHECK OUT/SF Would try ToprolXL 150mg  1x per day.  Can increase to 200 if needed.  Keep short acting 25 available.  Appended Document:  PER CHECK OUT/SF Patient aware ...will send to [facility] pharmacy.

## 2010-03-30 NOTE — Assessment & Plan Note (Signed)
Summary: missed 8/1 appointment/jr   Referring Provider:  n/a Primary Provider:  Berniece Andreas MD   History of Present Illness: Patient is a 55 year old with severe dysautonomia.  I saw her in clinic a few wks ago.  At that time she was complaining of signifiant leg pains.  Lab work up was [employer] other than a low Vit D level I recomm elev her legs when able and also trying support hose.   She has not tolerated the support hose but is feeling much better.  She thinks it may be due to the cooler weather.  Otherwise dizzinss is relatively unchanged.  Current Medications (verified): 1)  Catapres-Tts-2 0.2 Mg/24hr  Ptwk (Clonidine Hcl) .... Once A Week 2)  Catapres 0.1 Mg  Tabs (Clonidine Hcl) .Marland Kitchen.. 1 By Mouth At Bedtime 3)  Wellbutrin Xl 150 Mg  Tb24 (Bupropion Hcl) .Marland Kitchen.. 1 By Mouth Once Daily 4)  Zofran 8 Mg  Tabs (Ondansetron Hcl) .... As Needed 5)  Aspirin 81 Mg  Tabs (Aspirin) .Marland Kitchen.. 1 Tab Once Daily 6)  Klor-Con M20 20 Meq  Tbcr (Potassium Chloride Crys Cr) .... 4 By Mouth Once Daily 7)  Protonix 40 Mg  Tbec (Pantoprazole Sodium) .... As Needed 8)  Labetalol Hcl 200 Mg Tabs (Labetalol Hcl) .... One By Mouth Two Times A Day 9)  Simvastatin 10 Mg Tabs (Simvastatin) .... Take One Tablet By Mouth Daily At Bedtime 10)  Iv Hydration Once Weekly 11)  Vitamin B12 Injection .... Once Weekly 12)  Miralax  Powd (Polyethylene Glycol 3350) .... Take 1/2 Capful Dissolved in At Least 8 Ounces of Water/juice Once Daily. 13)  Metoprolol Succinate 100 Mg Xr24h-Tab (Metoprolol Succinate) .... Take One and A Half Tablets Every Day 14)  Metoprolol Tartrate 50 Mg Tabs (Metoprolol Tartrate) .... As Needed 15)  Vitamin D3 50000 Unit Caps (Cholecalciferol) .... Take 3 Tabs Weekly  Allergies (verified): 1)  ! Penicillin 2)  ! Sulfa 3)  ! Doxycycline 4)  ! Erythromycin  Past History:  Past medical, surgical, family and social histories (including risk factors) reviewed, and no changes noted (except as  noted below).  Past Medical History: Reviewed history from 11/01/2009 and no changes required. HISTORY OF MALIGNANT MELANOMA OF SKIN (ICD-V10.82) ARM PAIN, LEFT (ICD-729.5) INJURY UNSPEC NERVE SHOULDER GIRDLE&UPPER LIMB (ICD-955.9) diverticulitis ? 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 PACEMAKER, PERMANENT (ICD-V45.01)-placed 1999 RF Ablation-2002 Fatty tumors removed, chest wall. Nasal surgeries Melanoma removal T & A Carpal Tunnel Release  Family History: Reviewed history from 04/20/2009 and no changes required. Family History High cholesterol Family History Hypertension cad, cva parents  Family History of Diabetes: Maternal Grandmother, Paternal Grandmother Family History of Heart Disease: Mother, Father No FH of Colon Cancer:  Social History: Reviewed history from 11/01/2009 and no changes required. Married no children  Alcohol use-no self employed  Patient has never smoked.  Illicit Drug Use - no  Vital Signs:  Patient profile:   55 year old female Menstrual status:  postmenopausal Height:      63 inches Weight:      158 pounds BMI:     28.09 Pulse rate:   49 /  minute Resp:     16 per minute BP sitting:   114 / 84  (left arm)  Vitals Entered By: Marrion Coy, CNA (November 18, 2009 4:08 PM)  Physical Exam  Additional Exam:  Patient is in NAD HEENT:  Normocephalic, atraumatic. EOMI, PERRLA.  Neck: JVP is normal. No thyromegaly. No bruits.  Lungs: clear to auscultation. No rales no wheezes.  Heart: Regular rate and rhythm. Normal S1, S2. No S3.    No significant murmurs. PMI not displaced.  Abdomen:  Supple, nontender. Normal bowel sounds. No masses. No hepatomegaly.  Extremities:   Good distal pulses throughout. No lower extremity edema.  Musculoskeletal :moving all extremities.  Neuro:   alert and oriented x3.    PPM Specifications Following MD:  Sherryl Manges, MD     Referring MD:  ROSS PPM Vendor:  [employer]     PPM Model Number:  [id number]     PPM Serial Number:  [id number] PPM DOI:  11/09/2003     PPM Implanting MD:  Sherryl Manges, MD  Lead 1    Location: RA     DOI: 05/04/1997     Model #: [id number]     Serial #: [id number]     Status: active Lead 2    Location: RV     DOI: 05/04/1997     Model #: [id number]     Serial #: [id number]     Status: active  Magnet Response Rate:  BOL 85 ERI  65  Indications:  Syncope   PPM Follow Up Pacer Dependent:  No      Episodes Coumadin:  No  Parameters Mode:  MVP     Lower Rate Limit:  60     Upper Rate Limit:  130 Paced AV Delay:  250     Sensed AV Delay:  200  Impression & Recommendations:  Problem # 1:  * POTS SYNDROME Patient is doing better.  I would not change her medicines.    Problem # 2:  DIVERTICULOSIS, COLON, HX OF (ICD-V12.79) Follows with D. Juanda Chance  Problem # 3:  OSTEOPENIA (ICD-733.90) Still Vit D deficient.  Will check on Vit D injections with pharmacy.  Appended Document: missed 8/1 appointment/jr EKG:  Atrial paced.  60 bpm.  Appended Document: missed 8/1 appointment/jr Per Dr.Balan...increase Vitamin D to 50,000 units by mouth every day and check Vitamin D and Ca level in 6 weeks. Patient aware.

## 2010-03-30 NOTE — Miscellaneous (Signed)
Summary: Home Health Certification/Care Plan   Home Health Certification/Care Plan   Imported By: Roderic Ovens 02/14/2010 11:53:05  _____________________________________________________________________  External Attachment:    Type:   Image     Comment:   External Document

## 2010-03-30 NOTE — Progress Notes (Signed)
Summary: TALK TO RN ABOUT INFO FOR APPT   Phone Note Call from Patient Call back at Home Phone [phone] Call back at Work Phone [phone]   Caller: Patient Reason for Call: Talk to Nurse Summary of Call: HAS APPT WITH [facility] THURSDAY.  SHE WOULD LIKE TO DISCUSS SENDING INFO TO RN PRIOR TO APPT. Initial call taken by: Burnard Leigh,  September 13, 2008 10:30 AM  Follow-up for Phone Call        will inform dr allred of pt before Thurs. Dennis Bast, RN, BSN  September 13, 2008 12:14 PM

## 2010-03-30 NOTE — Miscellaneous (Signed)
Summary: Home Health Certification/Care Plan   Home Health Certification/Care Plan   Imported By: Roderic Ovens 05/13/2009 15:10:39  _____________________________________________________________________  External Attachment:    Type:   Image     Comment:   External Document

## 2010-03-30 NOTE — Progress Notes (Signed)
Summary: TRIAGE  Phone Note Call from Patient Call back at Home Phone [phone]   Caller: Patient Call For: Dr. Juanda Chance Reason for Call: Talk to Nurse Summary of Call: Pt is having left side abdominal pain. Initial call taken by: Karna Christmas,  March 31, 2009 10:10 AM  Follow-up for Phone Call        Pt. had Flex on 03-24-09. Is using Levsin, but it is running her Pulse/BP up. She states it works for the pain, but she is concerned about her vital signs. Also, she states it may be a "pain reaction." Has a constant,moderate LLQ pain/pressure. When she eats she is in more pain. Will be seeing a nutritionist tomorrow. Denies constipation, diarrhea, blood,black stools,fever,n/v. Pt. declines an appt. with the PA.  Will wait for Dr.Jhania Etherington to review and advise.   DR.Abhinav Mayorquin PLEASE REVIEW AND ADVISE  Follow-up by: Laureen Ochs LPN,  March 31, 2009 10:33 AM  Additional Follow-up for Phone Call Additional follow up Details #1::        Donnatal 1 by mouth three times a day, #30, 1 refill. Schedule barium enema, no air. Additional Follow-up by: Hart Carwin MD,  March 31, 2009 11:28 PM    Additional Follow-up for Phone Call Additional follow up Details #2::    Above MD orders reviewed with patient. Pt. is concerned about the BA Enema prep, "When I did the prep. for the Colonoscopy I had to be admitted to the hospital and have running IV's becuase I dehydrate so quickly, I don't want to pass out from this prep" ( Per [facility] scheduler-Prep. is mag.citrate, 3 dulcolax and a suppository.)  DR.Michala Deblanc PLEASE ADVISE  Follow-up by: Laureen Ochs LPN,  April 01, 2009 9:03 AM  Additional Follow-up for Phone Call Additional follow up Details #3:: Details for Additional Follow-up Action Taken: I agree with her concerns, then she ought to wait till she sees me in the office to discuss further options.DB  Message left for pt. with above MD instructions. OV scheduled for 04-20-09 at 9am. Pt.  instructed to call back as needed.  Additional Follow-up by: Laureen Ochs LPN,  April 04, 2009 9:35 AM  New/Updated Medications: DONNATAL  TABS (BELLADONNA ALK-PHENOBARBITAL) Take one by mouth 3 times daily. Prescriptions: DONNATAL  TABS (BELLADONNA ALK-PHENOBARBITAL) Take one by mouth 3 times daily.  #30 x 1   Entered by:   Laureen Ochs LPN   Authorized by:   Hart Carwin MD   Signed by:   Laureen Ochs LPN on 09/81/1914   Method used:   Electronically to        [facility] # [address] (retail)       [address]       [place], [name]  [address]       Ph: [phone]       Fax: [phone]   RxID:   [id number]

## 2010-03-30 NOTE — Assessment & Plan Note (Signed)
Summary: F/U FROM TRIAGE ON 03-31-09.             [name]   History of Present Illness Visit Type: Follow-up Visit Primary GI MD: [name] MD Primary Provider: Berniece Andreas MD Requesting Provider: n/a Chief Complaint: LLQ abd pain History of Present Illness:   This is a 55 year old white female with POTs syndrome and symptomatic diverticulosis of the left colon. She is status post recent flexible sigmoidoscopy for evaluation of left lower quadrant abdominal pain with findings of moderately severe diverticulosis. She was unable to tolerate the Donnatal but has taken bentyl 20 mg 1/2 tablet for symptomatic relief of left lower quadrant abdominal discomfort. She also takes MiraLax 17 g daily. Patient is unable to tolerate a high-fiber diet. She is followed by [facility] for an elevated hemoglobin A1c which has increased from 5.5, 9 months ago to a current 6.2. She was on metformin but could not tolerate that due to cramps and diarrhea. She is reluctant to go back on it.   GI Review of Systems    Reports abdominal pain.     Location of  Abdominal pain: LLQ.    Denies acid reflux, belching, bloating, chest pain, dysphagia with liquids, dysphagia with solids, heartburn, loss of appetite, nausea, vomiting, vomiting blood, weight loss, and  weight gain.        Denies anal fissure, black tarry stools, change in bowel habit, constipation, diarrhea, diverticulosis, fecal incontinence, heme positive stool, hemorrhoids, irritable bowel syndrome, jaundice, light color stool, liver problems, rectal bleeding, and  rectal pain.    Current Medications (verified): 1)  Catapres-Tts-2 0.2 Mg/24hr  Ptwk (Clonidine Hcl) .... Once A Week 2)  Catapres 0.1 Mg  Tabs (Clonidine Hcl) .Marland Kitchen.. 1 By Mouth At Bedtime 3)  Wellbutrin Xl 150 Mg  Tb24 (Bupropion Hcl) .Marland Kitchen.. 1 By Mouth Once Daily 4)  Phenobarbital 64.8 Mg  Tabs (Phenobarbital) .Marland Kitchen.. 1 By Mouth At Bedtime 5)  Lopressor 50 Mg  Tabs (Metoprolol Tartrate) .Marland Kitchen.. 1 By Mouth  Three Times A Day 6)  Zofran 8 Mg  Tabs (Ondansetron Hcl) .... As Needed 7)  Aspirin 81 Mg  Tabs (Aspirin) .Marland Kitchen.. 1 Tab Once Daily 8)  Klor-Con M20 20 Meq  Tbcr (Potassium Chloride Crys Cr) .... 4 By Mouth Once Daily 9)  Protonix 40 Mg  Tbec (Pantoprazole Sodium) .... As Needed 10)  Labetalol Hcl 200 Mg Tabs (Labetalol Hcl) .... Take Two Tablets By Mouth Every Day 11)  Simvastatin 10 Mg Tabs (Simvastatin) .... Take One Tablet By Mouth Daily At Bedtime 12)  Iv Hydration Once Weekly 13)  Vitamin D 400 Unit  Tabs (Cholecalciferol) .Marland Kitchen.. 1 By Mouth Once Daily 14)  Vitamin B12 Injection .... Once Weekly 15)  Miralax  Powd (Polyethylene Glycol 3350) .... Take 1/2 Capful Dissolved in At Least 8 Ounces of Water/juice Once Daily 16)  Bentyl 20 Mg Tabs (Dicyclomine Hcl) .... 1/2 Tablet By Mouth Two Times A Day  Allergies (verified): 1)  ! Penicillin 2)  ! Sulfa 3)  ! Doxycycline 4)  ! Erythromycin  Past History:  Past Medical History: Reviewed history from 01/19/2009 and no changes required. HISTORY OF MALIGNANT MELANOMA OF SKIN (ICD-V10.82) ARM PAIN, LEFT (ICD-729.5) INJURY UNSPEC NERVE SHOULDER GIRDLE&UPPER LIMB (ICD-955.9) diverticulitis ? 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 PACEMAKER, PERMANENT (ICD-V45.01)-placed 1999 RF Ablation-2002 Fatty tumors removed, chest wall. Nasal surgeries Melanoma removal T & A Carpal Tunnel Release  Family History: Family History High cholesterol Family History Hypertension cad, cva parents  Family History of Diabetes: Maternal Grandmother, Paternal  Grandmother Family History of Heart Disease: Mother, Father No FH of Colon Cancer:  Social History: Reviewed history from 08/09/2008 and no changes required. Married remote smoker  Alcohol use-no self employed   Review of Systems  The patient denies allergy/sinus, anemia, anxiety-new, arthritis/joint pain, back pain, blood in urine, breast changes/lumps, change in vision, confusion, cough, coughing up blood, depression-new, fainting, fatigue, fever, headaches-new, hearing problems, heart murmur, heart rhythm changes, itching, menstrual pain, muscle pains/cramps, night sweats, nosebleeds, pregnancy symptoms, shortness of breath, skin rash, sleeping problems, sore throat, swelling of feet/legs, swollen lymph glands, thirst - excessive , urination - excessive , urination changes/pain, urine leakage, vision changes, and voice change.         Pertinent positive and negative review of systems were noted in the above HPI. All other ROS was otherwise negative.   Vital Signs:  Patient profile:   55 year old female Menstrual status:  postmenopausal Height:      63 inches Weight:      152 pounds BMI:     27.02 BSA:     1.72 Pulse rate:   76 / minute Pulse rhythm:   regular BP sitting:   122 / 74  (left arm) Cuff size:   regular  Vitals Entered By: Ok Anis CMA (April 20, 2009 9:43 AM)  Physical Exam  General:  Well developed, well nourished, no acute distress. Eyes:  PERRLA, no icterus. Mouth:  No deformity or lesions, dentition normal. Neck:  Supple; no masses or thyromegaly. Lungs:  Clear throughout to auscultation. Heart:  Regular rate and rhythm; no murmurs, rubs,  or bruits. Abdomen:  soft abdomen with normal active bowel sounds. No distention. No tenderness except in the left lower quadrant along the sigmoid colon. There is no palpable mass or rebound. Liver edge is at the costal margin. Extremities:  No clubbing, cyanosis, edema or deformities noted. Skin:  Intact without  significant lesions or rashes. Psych:  Alert and cooperative. Normal mood and affect.   Impression & Recommendations:  Problem # 1:  DIVERTICULOSIS, COLON, HX OF (ICD-V12.79) Patient has symptomatic left colon diverticulosis combined with constipation predominant irritable bowel syndrome. She is intolerant to antispasmodics which exacerbate her POTs syndrome. She is able to take 10 mg of Bentyl p.r.n. She is going to a nutritionist to be instructed about a regular fiber content diet. She is to continue on the MiraLax daily. I would not recommend surgical intervention because of her circulatory instability and the fact that some of her symptoms are almost certainly due to irritable bowel syndrome.  Problem # 2:  * POTS SYNDROME Paient is followed for this by Dr Tenny Craw.  Problem # 3:  ABDOMINAL PAIN, LEFT LOWER QUADRANT (ICD-789.04) We will try the patient on Ativan 0.5 mg p.r.n. colon spasm in place of bentyl which may affect her tachycardia. She will try Ativan 0.5 mg and let us know how it works.  Patient Instructions: 1)  nutritional consult pending. 2)  Continue Bentyl 10 mg p.r.n. 3)  Ativan 0.5 mg p.r.n. colon spasms. 4)  Office visit 6 months. 5)  Copy sent to :Dr Fabian Sharp  6)  The medication list was reviewed and reconciled.  All changed / newly prescribed medications were explained.  A complete medication list was provided to the patient / caregiver. Prescriptions: METOPROLOL SUCCINATE 100 MG XR24H-TAB (METOPROLOL SUCCINATE) take one and a half tablets every day  #60 x 6   Entered by:   Layne Benton, RN, BSN   Authorized by:   Sherrill Raring, MD, [facility]   Signed by:   Layne Benton, RN, BSN on 04/20/2009   Method used:   Faxed to ...       [facility] # [address] (retail)       [address]       [place], [name]  [address]       Ph: [phone]       Fax: [phone]   RxID:   [id number] ATIVAN 0.5 MG TABS (LORAZEPAM) Take 1 tablet by mouth as needed   #30 x 1   Entered by:   Hortense Ramal CMA (AAMA)   Authorized by:   Hart Carwin MD   Signed by:   Hortense Ramal CMA (AAMA) on 04/20/2009   Method used:   Printed then faxed to ...       [facility] # [id number] (retail)       [address]       [place], [name]  [address]       Ph: [phone]       Fax: [phone]   RxID:   [id number]

## 2010-03-30 NOTE — Assessment & Plan Note (Signed)
Summary: per check out/sf   Referring Provider:  Berton Mount, MD Primary Provider:  Berniece Andreas MD  CC:  follow up  and pt not feeling great.  She has had a lot of tachycardia and her blood pressure has been going up and down.  SOB.  History of Present Illness: Mrs. [name] is seen at her request a follow relatively long hiatus where it she has been followed by Dr. Dietrich Pates Dr. Fabian Sharp and Dr. Berneice Gandy in August for a constellation of this are not symptoms recently identified diabetes hypertension and positive persistent exercise-induced tachycardia which has been limiting. She recently went to [facility] and came back with a recommendation to start dronaderone which we then did after discussions about the potential risks. She comes in today in followup for that. She had water pills with palpitations. The dronaderone has been associated with nausea and diarrhea. She has awakened on one occasion with palpitations.  She recounts that in the spring when she last tried exercise her heart rate would go to 200s. She notes that even with cold water swimming she was unable to tolerate exercise or the post exercise phase. She remains fluid replete but with her hypertension has had to cut back on her salt intake.  She continues to take weekly hydration now limited to her left arm and she had a melanoma resection on her right arm with a lymph node resection  Current Medications (verified): 1)  Catapres-Tts-2 0.2 Mg/24hr  Ptwk (Clonidine Hcl) 2)  Catapres 0.1 Mg  Tabs (Clonidine Hcl) .Marland Kitchen.. 1 By Mouth At Bedtime 3)  Wellbutrin Xl 150 Mg  Tb24 (Bupropion Hcl) .Marland Kitchen.. 1 By Mouth Once Daily 4)  Phenobarbital 64.8 Mg  Tabs (Phenobarbital) .Marland Kitchen.. 1 By Mouth At Bedtime 5)  Lopressor 50 Mg  Tabs (Metoprolol Tartrate) .Marland Kitchen.. 1 By Mouth Three Times A Day 6)  Zofran 8 Mg  Tabs (Ondansetron Hcl) .... As Needed 7)  Aspirin 81 Mg  Tabs (Aspirin) .Marland Kitchen.. 1 Tab Once Daily 8)  Klor-Con M20 20 Meq  Tbcr (Potassium Chloride Crys Cr) .... 4  By Mouth Once Daily 9)  Protonix 40 Mg  Tbec (Pantoprazole Sodium) .... As Needed 10)  Labetalol Hcl 200 Mg  Tabs (Labetalol Hcl) .Marland Kitchen.. 1 By Mouth Two Times A Day 11)  Simvastatin 10 Mg Tabs (Simvastatin) .... Take One Tablet By Mouth Daily At Bedtime 12)  Iv Hydration Once Weekly 13)  Vitamin D 400 Unit  Tabs (Cholecalciferol) .Marland Kitchen.. 1 By Mouth Once Daily 14)  Vitamin B12 Injection .... Once Weekly 15)  Multaq 400 Mg Tabs (Dronedarone Hcl) .... Take One Tablet Twice Daily  Allergies (verified): 1)  ! Penicillin 2)  ! Sulfa 3)  ! Doxycycline 4)  ! Erythromycin  Vital Signs:  Patient profile:   55 year old female Menstrual status:  postmenopausal Height:      63 inches Weight:      151 pounds BMI:     26.85 Pulse rate:   83 / minute Pulse rhythm:   regular BP sitting:   134 / 90  (left arm) Cuff size:   regular  Vitals Entered By: Judithe Modest CMA (November 02, 2008 3:22 PM)  Physical Exam  General:  The patient was alert and oriented in no acute distress. Neck veins were flat, carotids were brisk.  Lungs were clear.  Heart sounds were regular    . There is no clubbing cyanosis or edema. Skin Warm and dry    PPM Specifications  Following MD:  Sherryl Manges, MD     PPM Vendor:  [employer]     PPM Model Number:  [id number]     PPM Serial Number:  [id number] PPM DOI:  11/09/2003     PPM Implanting MD:  Sherryl Manges, MD  Lead 1    Location: RA     DOI: 05/04/1997     Model #: [id number]     Serial #: [id number]     Status: active Lead 2    Location: RV     DOI: 05/04/1997     Model #: [id number]     Serial #: [id number]     Status: active   Indications:  Syncope   PPM Follow Up Remote Check?  No Battery Voltage:  2.99 V     Battery Est. Longevity:  2.6 years     Pacer Dependent:  No       PPM Device Measurements Atrium  Amplitude: 4.7 mV, Impedance: 432 ohms, Threshold: 1.0 V at 0.4 msec Right Ventricle  Amplitude: 6.2 mV, Impedance: 560 ohms, Threshold: 0.5 V at 0.4  msec  Episodes MS Episodes:  0     Percent Mode Switch:  0     Coumadin:  No Atrial Pacing:  15.6%     Ventricular Pacing:  0%  Parameters Mode:  DDD+     Lower Rate Limit:  60     Upper Rate Limit:  130 Paced AV Delay:  250     Sensed AV Delay:  200 Tech Comments:  81 AHR No changes. Checked by [employer], LPN  November 02, 2008 3:44 PM   Impression & Recommendations:  Problem # 1:  ATRIAL TACHYCARDIA (ICD-427.89) interrogation of her pacemaker demonstrated a number of high atrial rate episodes. In the patient's mind and according to the recordings most of them occur in the morning. Electrogram evaluation of these episodes however reveal a bipolar EGram that is virtually identical to sinus which raises the concern that these are not atrial tachycardia at all or other sinus in origin. That being the case and the difficulties that she has had with dronaderone from a GI point of view makes it me think that it is prudent to discontinue that drug at this time. There also seems to be in uptick in the frequency of her episodes since the drug initiation.  Tachycardia and limits her ability to exercise;  further beta blockers are likely going to be necessary and the question is which one should we utilized. She takes 2 different ones now for her blood pressure and her tachypalpitations. We'll increase then sequentially to see if we can identify a benefit and tolerance. Initially we will increase her labetalol followed a 2 week washout of her dronaderone. in the second half of a 2 week trial she will try exercising. 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 1 by mouth at bedtime    Lopressor 50 Mg Tabs (Metoprolol tartrate) .Marland Kitchen... 1 by mouth three times a day    Aspirin 81 Mg Tabs (Aspirin) .Marland Kitchen... 1 tab once daily  Labetalol Hcl 200 Mg Tabs (Labetalol hcl) .Marland Kitchen... 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 In 2 weeks (Sept 22) increase Labetalol to 400mg  twice daily. On October 6, decrease Labetalol to 200mg  daily AND increase Lopressor to 150mg  twice daily. 2)  Your physician recommends that you schedule a follow-up appointment in: 6 weeks Prescriptions: LABETALOL HCL 200 MG TABS (LABETALOL HCL) Take one tablet by mouth twice a day  #60 x 1   Entered by:   Duncan Dull, RN, BSN   Authorized by:   Nathen May, MD, [facility]   Signed by:   Duncan Dull, RN, BSN on 11/02/2008   Method used:   Electronically to        [facility] # [address]* (retail)       [address]       [place], [name]  [address]       Ph: [phone]       Fax: [phone]   RxID:   [id number]

## 2010-03-30 NOTE — Miscellaneous (Signed)
Summary: Physical Therapy Re-Evaluation/[facility]  Physical Therapy Re-Evaluation/Integrative Therapies   Imported By: Maryln Gottron 06/02/2009 15:40:28  _____________________________________________________________________  External Attachment:    Type:   Image     Comment:   External Document

## 2010-03-30 NOTE — Miscellaneous (Signed)
Summary: Physical Therapy Evaluation/Integrative Therapies  Physical Therapy Evaluation/Integrative Therapies   Imported By: Maryln Gottron 04/25/2009 14:02:22  _____________________________________________________________________  External Attachment:    Type:   Image     Comment:   External Document

## 2010-03-30 NOTE — Cardiovascular Report (Signed)
Summary: Office Visit   Office Visit   Imported By: Roderic Ovens 11/23/2008 12:31:03  _____________________________________________________________________  External Attachment:    Type:   Image     Comment:   External Document

## 2010-03-30 NOTE — Progress Notes (Signed)
Summary: Triage  Phone Note Call from Patient Call back at Home Phone [phone] Call back at Work Phone [phone]   Caller: Patient Call For: Dr. Juanda Chance Reason for Call: Talk to Nurse Summary of Call: Would like to discuss her diverticulitis issues and her meds. Initial call taken by: Karna Christmas,  October 04, 2009 12:58 PM  Follow-up for Phone Call        Pt [facility] [place]  Would like a larger supply since she will need to be on this chronically. Follow-up by: Ashok Cordia RN,  October 04, 2009 1:48 PM    New/Updated Medications: MIRALAX  POWD (POLYETHYLENE GLYCOL 3350) Take 1/2 capful dissolved in at least 8 ounces of water/juice once daily. Prescriptions: MIRALAX  POWD (POLYETHYLENE GLYCOL 3350) Take 1/2 capful dissolved in at least 8 ounces of water/juice once daily.  #527 gm x 11   Entered by:   Ashok Cordia RN   Authorized by:   Hart Carwin MD   Signed by:   Ashok Cordia RN on 10/04/2009   Method used:   Electronically to        [facility] # [address]* (retail)       [address]       [place], [name]  [address]       Ph: [phone]       Fax: [phone]   RxID:   [id number]

## 2010-03-30 NOTE — Progress Notes (Signed)
Summary: Urine culture results  Phone Note Call from Patient   Caller: Patient Call For: Madelin Headings MD Summary of Call: Pt is calling for Urine Culture results. [phone] Initial call taken by: Lynann Beaver CMA,  January 26, 2009 8:51 AM  Follow-up for Phone Call        Spoke to pt- the pain is a little bit better but she does feel tired. Pain scale at 4. Pt is aware that urine culture wasn't done. Pt is still on the antibiotic.  Per Dr. Fabian Sharp- Would consider seeing GI but finish out the antibiotic. Follow-up by: Romualdo Bolk, CMA Duncan Dull),  January 26, 2009 9:38 AM  Additional Follow-up for Phone Call Additional follow up Details #1::        LMTOCB Additional Follow-up by: Romualdo Bolk, CMA (AAMA),  January 26, 2009 3:11 PM    Additional Follow-up for Phone Call Additional follow up Details #2::    Pt aware and is wanting to go ahead with appt with GI . Pt see's Dr. Juanda Chance. She would like for [name] to make the appt. Order sent to [facility].  Follow-up by: Romualdo Bolk, CMA (AAMA),  January 27, 2009 11:07 AM

## 2010-03-30 NOTE — Letter (Signed)
Summary: Home Health Cert   Home Health Cert   Imported By: Marylou Mccoy 02/13/2010 18:40:01  _____________________________________________________________________  External Attachment:    Type:   Image     Comment:   External Document

## 2010-03-30 NOTE — Letter (Signed)
Summary: [facility]   Imported By: Maryln Gottron 02/19/2008 13:56:00  _____________________________________________________________________  External Attachment:    Type:   Image     Comment:   External Document

## 2010-03-30 NOTE — Miscellaneous (Signed)
Summary: Home Health Certification/Care Plan  Home Health Certification/Care Plan   Imported By: Roderic Ovens 03/28/2009 16:42:54  _____________________________________________________________________  External Attachment:    Type:   Image     Comment:   External Document

## 2010-03-30 NOTE — Assessment & Plan Note (Signed)
Summary: ONGOING RLQ PAIN               DEBORAH   History of Present Illness Visit Type: Follow-up Visit Primary GI MD: [name] MD Primary Mariame Rybolt: Berniece Andreas MD Requesting Allisyn Kunz: Berniece Andreas MD Chief Complaint: RLQ abdominal pain, has completed a rx of Cipro that was prescibed by Dr Fabian Sharp. Pt aslo wants to f/u from CT scan ordered bt Dr Fabian Sharp History of Present Illness:   This is a 56 year old white female with POTS syndrome and chronic symptomatic diverticulosis with episodes of diverticulitis. She is now complaining all almost constant left lower quadrant abdominal pain and change in bowel habits which appears to be more constipated. She has tried Metamucil daily and prune juice. She is unable to tolerate a high fiber diet. Metformin gave her diarrhea. She saw a nutritionist to learn how to modify diet but has only had mild improvement of her symptoms. A CT Scan of the abdomen done 3 weeks ago was unremarkable, confirming the presence of diverticulosis. Her appendix could not be visualized. There were no acute inflammatory changes. Patient saw Dr. 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   GI Review of Systems    Reports abdominal pain.     Location of  Abdominal pain: RLQ.    Denies acid reflux, belching, bloating, chest pain, dysphagia with liquids, dysphagia with solids, heartburn, loss of appetite, nausea, vomiting, vomiting blood, weight loss, and  weight gain.      Reports diverticulosis.     Denies anal fissure, black tarry stools, change in bowel habit, constipation, diarrhea, fecal incontinence, heme positive stool, hemorrhoids, irritable bowel syndrome, jaundice, light color stool, liver problems, rectal bleeding, and  rectal pain.    Current Medications (verified): 1)  Catapres-Tts-2 0.2 Mg/24hr  Ptwk (Clonidine Hcl) 2)  Catapres 0.1 Mg  Tabs (Clonidine Hcl) .Marland Kitchen.. 1 By  Mouth At Bedtime 3)  Wellbutrin Xl 150 Mg  Tb24 (Bupropion Hcl) .Marland Kitchen.. 1 By Mouth Once Daily 4)  Phenobarbital 64.8 Mg  Tabs (Phenobarbital) .Marland Kitchen.. 1 By Mouth At Bedtime 5)  Lopressor 50 Mg  Tabs (Metoprolol Tartrate) .Marland Kitchen.. 1 By Mouth Three Times A Day 6)  Zofran 8 Mg  Tabs (Ondansetron Hcl) .... As Needed 7)  Aspirin 81 Mg  Tabs (Aspirin) .Marland Kitchen.. 1 Tab Once Daily 8)  Klor-Con M20 20 Meq  Tbcr (Potassium Chloride Crys Cr) .... 4 By Mouth Once Daily 9)  Protonix 40 Mg  Tbec (Pantoprazole Sodium) .... As Needed 10)  Labetalol Hcl 200 Mg Tabs (Labetalol Hcl) .... Take Two Tablets By Mouth Every Day 11)  Simvastatin 10 Mg Tabs (Simvastatin) .... Take One Tablet By Mouth Daily At Bedtime 12)  Iv Hydration Once Weekly 13)  Vitamin D 400 Unit  Tabs (Cholecalciferol) .Marland Kitchen.. 1 By Mouth Once Daily 14)  Vitamin B12 Injection .... Once Weekly  Allergies (verified): 1)  ! Penicillin 2)  ! Sulfa 3)  ! Doxycycline 4)  ! Erythromycin  Past History:  Past Medical History: Last updated: 01/19/2009 HISTORY OF MALIGNANT MELANOMA OF SKIN (ICD-V10.82) ARM PAIN, LEFT (ICD-729.5) INJURY UNSPEC NERVE SHOULDER GIRDLE&UPPER LIMB (ICD-955.9) diverticulitis ? 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 Nasal surgeries Melanoma removal T & A Carpal Tunnel Release  Family History: Last updated: 01/31/2009 Family History High cholesterol Family History Hypertension cad, cva parents   Family History of Diabetes: Maternal Grandmother, Paternal Grandmother Family  History of Heart Disease: Mother, Father  Social History: Last updated: 08/09/2008 Married remote smoker  Alcohol use-no self employed   Review of Systems  The patient denies allergy/sinus, anemia, anxiety-new, arthritis/joint pain, back pain, blood in urine, breast changes/lumps, change in vision, confusion, cough, coughing up blood, depression-new, fainting, fatigue, fever, headaches-new, hearing problems, heart murmur, heart rhythm changes, itching, menstrual pain, muscle pains/cramps, night sweats, nosebleeds, pregnancy symptoms, shortness of breath, skin rash, sleeping problems, sore throat, swelling of feet/legs, swollen lymph glands, thirst - excessive , urination - excessive , urination changes/pain, urine leakage, vision changes, and voice change.         Pertinent positive and negative review of systems were noted in the above HPI. All other ROS was otherwise negative.   Vital Signs:  Patient profile:   55 year old female Menstrual status:  postmenopausal Height:      63 inches Weight:      153 pounds BMI:     27.20 BSA:     1.73 Pulse rate:   70 / minute Pulse rhythm:   regular BP sitting:   112 / 80  (left arm)  Vitals Entered By: Merri Ray CMA (AAMA) (February 01, 2009 10:09 AM)  Physical Exam  General:  alert, oriented and in no distress. Mouth:  no lesions, moist mucosa. Neck:  jugular veins not distended. Chest Wall:  permanent transvenous pacemaker present. Lungs:  Clear throughout to auscultation. Heart:  Regular rate and rhythm; no murmurs, rubs,  or bruits. Abdomen:  soft abdomen with marked tenderness in left lower quadrant without rebound or palpable mass. Right lower and upper quadrants are unremarkable and there is no distention. Rectal:  soft Hemoccult negative stool. Extremities:  No clubbing, cyanosis, edema or deformities noted. Skin:  status post excision of melanoma right shoulder Psych:  Alert and cooperative. Normal mood and  affect.   Impression & Recommendations:  Problem # 1:  DIVERTICULOSIS, COLON, HX OF (ICD-V12.79) Patient has moderately severe to severe symptomatic diverticulosis of the left colon with at least 2 prior episodes of diverticulitis. Her last colonoscopy was in 2008. Patient is interested in further evaluation. She may need a barium enema to delineate the critical segment of the sigmoid colon. We will also do a flexible sigmoidoscopy first to assess for any acute inflammation. She will start Bentyl 20 mg p.r.n. crampy abdominal pain and MiraLax 9-17 g daily for constipation. Her prep for a flexible sigmoidoscopy will be limited to avoid hospitalization for hydration.  Orders: Flex with Sedation (Flex w/Sed)  Problem # 2:  * POTS SYNDROME Currently not symptomatic.  Orders: Flex with Sedation (Flex w/Sed)  Patient Instructions: 1)  Limit fiber intake. 2)  Continue Metamucil 1 tablespoon daily. 3)  MiraLax 9-17 g daily. 4)  Bentyl 20 mg p.o. p.r.n. crampy abdominal pain. 5)  Flexible sigmoidoscopy. 6)  Consider surgical consultation and barium enema. 7)  Copy sent to : Dr Berniece Andreas Prescriptions: BENTYL 20 MG TABS (DICYCLOMINE HCL) Take 1 tablet by mouth as needed for abdominal pain  #30 x 1   Entered by:   Hortense Ramal CMA (AAMA)   Authorized  by:   Hart Carwin MD   Signed by:   Hortense Ramal CMA (AAMA) on 02/01/2009   Method used:   Electronically to        [facility] # [address]* (retail)       [address]       [place], [name]  [address]       Ph: [phone]       Fax: [phone]   RxID:   [id number] MIRALAX  POWD (POLYETHYLENE GLYCOL 3350) Take 1/2 capful dissolved in at least 8 ounces of water/juice once daily  #527 grams x 3   Entered by:   Hortense Ramal CMA (AAMA)   Authorized by:   Hart Carwin MD   Signed by:   Hortense Ramal CMA (AAMA) on 02/01/2009   Method used:   Electronically to        [facility] # [address]* (retail)        [address]       [place], [name]  [address]       Ph: [phone]       Fax: [phone]   RxID:   [id number]

## 2010-03-30 NOTE — Assessment & Plan Note (Signed)
Summary: lab draw on Friday might have damaged   Vital Signs:  Patient profile:   55 year old female Menstrual status:  postmenopausal Height:      63 inches Weight:      151 pounds Pulse rate:   72 / minute BP sitting:   110 / 70  (left arm) Cuff size:   regular  Vitals Entered By: Romualdo Bolk, CMA (Jul 12, 2008 2:11 PM) CC: Pt got labs drawn at [facility] on Friday and states that they hit a nerve in her left arm. Pt is having pain, numbness and tingling. When pt uses her left hand, it feels like someone is pulling the never out of her arm. LMP - Character: age 84 Menarche (age onset): 11-13 years   days  Menstrual Status postmenopausal   History of Present Illness: [name] comes in t oday for acute visit because she had shooting nerve type pain 3 days ago when attempted venipuncture left arm.    Pain was immetdiate and radiated down to hand . no weakness but has tenderness and shooting pain to whole hand and numbness at times  after this. NO swelling or bruising .    interim hx  :melanoma 4 right upper right arm with wide excision and sentinal node bxthus no blood draw from right arm . Has had a pet scan neg except cw diverticulitis.  Recently r lemelanoma also removed  level 2 breslow and local care   Dr Tenny Craw ordered lab   lipomed vit d and  sgpt     .      Preventive Screening-Counseling & Management     Alcohol drinks/day: <1     Alcohol type: wine     Smoking Status: never     Caffeine use/day: 0     Does Patient Exercise: yes     Type of exercise: WALKING  Current Medications (verified): 1)  Catapres-Tts-2 0.2 Mg/24hr  Ptwk (Clonidine Hcl) 2)  Catapres 0.1 Mg  Tabs (Clonidine Hcl) .Marland Kitchen.. 1 By Mouth At Bedtime 3)  Wellbutrin Xl 150 Mg  Tb24 (Bupropion Hcl) .Marland Kitchen.. 1 By Mouth Once Daily 4)  Phenobarbital 64.8 Mg  Tabs (Phenobarbital) .Marland Kitchen.. 1 By Mouth At Bedtime 5)  Lopressor 50 Mg  Tabs (Metoprolol Tartrate) .Marland Kitchen.. 1 By Mouth Three Times A Day 6)  Zofran 8 Mg  Tabs  (Ondansetron Hcl) .... As Needed 7)  Aspirin 81 Mg  Tabs (Aspirin) 8)  Klor-Con M20 20 Meq  Tbcr (Potassium Chloride Crys Cr) .... 4 By Mouth Once Daily 9)  Protonix 40 Mg  Tbec (Pantoprazole Sodium) .Marland Kitchen.. 1 By Mouth Once Daily 10)  Labetalol Hcl 200 Mg  Tabs (Labetalol Hcl) .Marland Kitchen.. 1 By Mouth Two Times A Day 11)  Zocor 20 Mg  Tabs (Simvastatin) .Marland Kitchen.. 1 By Mouth Once Daily 12)  Methocarbamol 500 Mg  Tabs (Methocarbamol) .Marland Kitchen.. 1 By Mouth Three Times A Day As Needed Muscle Spasm 13)  Iv Hydration Once Weekly 14)  Vicodin 5-500 Mg Tabs (Hydrocodone-Acetaminophen) .... Take 1 Tab Every 6 Hours As Needed For Pain  Allergies (verified): 1)  ! Penicillin 2)  ! Sulfa 3)  ! Doxycycline 4)  ! Erythromycin  Past History:  Past Medical History:    Osteopenia    POTS dysautomia    self cath urine     g0p0    nasal surgery for nasal bleeding      Melanoma in Rt arm and had lymphonodes removed 02/16/08 level4  Melanoma in Rt leg 06/21/08 Breslow 2   Past History:  Care Management:    Cardiology: Tenny Craw    Gastroenterology: Juanda Chance    Endocrinology: [facility]  Social History:    Smoking Status:  never    Caffeine use/day:  0    Does Patient Exercise:  yes  Physical Exam  General:  Well-developed,well-nourished,in no acute distress; alert,appropriate and cooperative throughout examination Msk:  left arm without deformity or atrophy  n o   redness or bruising .  tender  at antecubital fossa hand grip strength nl  Pulses:  pulses intact without delay  nl cap refill and color in left hand  Extremities:  no clubbing cyanosis or edema  Neurologic:  alert & oriented X3 and gait normal.  no weakness of ue  left.   Skin:  well  healed scar right upper arm   no petechiae and no purpura.   Cervical Nodes:  No lymphadenopathy noted   Impression & Recommendations:  Problem # 1:  INJURY UNSPEC NERVE SHOULDER GIRDLE&UPPER LIMB (ICD-955.9) continue pain on touching but no pobv  weakness or atrophy and circulation is good at presnt . No more  venipuncture for now ice at antecubital area or   nothing and avoid injury  call in one weelk or as needed.      Problem # 2:  ARM PAIN, LEFT (ICD-729.5) Assessment: Comment Only see above    Problem # 3:  PERSONAL HISTORY OF MALIGNANT MELANOMA OF SKIN (ICD-V10.82) Assessment: Comment Only  Complete Medication List: 1)  Catapres-tts-2 0.2 Mg/24hr Ptwk (Clonidine hcl) 2)  Catapres 0.1 Mg Tabs (Clonidine hcl) .Marland Kitchen.. 1 by mouth at bedtime 3)  Wellbutrin Xl 150 Mg Tb24 (Bupropion hcl) .Marland Kitchen.. 1 by mouth once daily 4)  Phenobarbital 64.8 Mg Tabs (Phenobarbital) .Marland Kitchen.. 1 by mouth at bedtime 5)  Lopressor 50 Mg Tabs (Metoprolol tartrate) .Marland Kitchen.. 1 by mouth three times a day 6)  Zofran 8 Mg Tabs (Ondansetron hcl) .... As needed 7)  Aspirin 81 Mg Tabs (Aspirin) 8)  Klor-con M20 20 Meq Tbcr (Potassium chloride crys cr) .... 4 by mouth once daily 9)  Protonix 40 Mg Tbec (Pantoprazole sodium) .Marland Kitchen.. 1 by mouth once daily 10)  Labetalol Hcl 200 Mg Tabs (Labetalol hcl) .Marland Kitchen.. 1 by mouth two times a day 11)  Zocor 20 Mg Tabs (Simvastatin) .Marland Kitchen.. 1 by mouth once daily 12)  Methocarbamol 500 Mg Tabs (Methocarbamol) .Marland Kitchen.. 1 by mouth three times a day as needed muscle spasm 13)  Iv Hydration Once Weekly  14)  Vicodin 5-500 Mg Tabs (Hydrocodone-acetaminophen) .... Take 1 tab every 6 hours as needed for pain  Patient Instructions: 1)  local care no trauma   2)  no blood draw until  resolved   3)  call in 1 week if not resolved or as needed worse .

## 2010-03-30 NOTE — Miscellaneous (Signed)
Summary: Home Health Certification/Care Plan   Home Health Certification/Care Plan   Imported By: Roderic Ovens 02/07/2010 15:24:42  _____________________________________________________________________  External Attachment:    Type:   Image     Comment:   External Document

## 2010-03-30 NOTE — Assessment & Plan Note (Signed)
Summary: follow up/ssc   Vital Signs:  Patient Profile:   55 Years Old Female Weight:      158 pounds Pulse rate:   66 / minute BP sitting:   120 / 80  (left arm) Cuff size:   regular  Vitals Entered By: Romualdo Bolk, CMA (September 26, 2007 8:42 AM)                 Chief Complaint:  Follow up .  History of Present Illness: [name] is here for a follow up from the ED. See last note   . Has been having  neck and muscle aches  and was living on motrin .      was at a meeting and had arms propped up and left arm went numb and didnt  resolve until the next day  but there was some question of  other neuro .sx   .Numbness is gone  but .     Neck still bothers her.     ? if had low grade fever previous freezing . max  99.   She was origonally  evaluated for lyme disease and started on ceftin but serology neg and taken off.  Pt stated that Dr. Tenny Craw office is thinking that the Zocor could be causing elevation of her cpk noted at hospital  this and they will take care of her with this medication.   Her  blood sugars being low in am and then higher than normal in pm. She states that she gets sweats and shakes during the day.      She is to see Dr Talmage Nap in about a week or so.   IN the past  her insulin level was ok but she didnt have hypoglycemia at that time.     readings are 170  to 30s   usually fasting hypoglycemia    doesnt wake in the night because of the phenobarbitol  .   also  gets .sx 2 hours after eating  POTS    gets Hydration every Sunday night    .     Vit d level 12  and on high dose vit d 2 x per week   now 21 after a year ? absorption problem  per    Dr Berneice Gandy and Dr Tenny Craw.       Prior Medications Reviewed Using: Patient Recall  Current Allergies: ! PENICILLIN ! SULFA ! DOXYCYCLINE ! ERYTHROMYCIN  Past Medical History:    Osteopenia    POTS dysautomia    self cath urine     g0p0    nasal surgery for nasal bleeding         Family History:    Family History  High cholesterol    Family History Hypertension    cad, cva parents         Review of Systems       no unusual rashes   no numbness in le and no weakness  focally  speech and vision  no change   Physical Exam  General:     alert, well-developed, well-nourished, and well-hydrated.  looks well at present Head:     normocephalic and atraumatic.   Eyes:     PERRL, EOMs full, conjunctiva clear  Ears:     R ear normal and L ear normal.   Nose:     no external erythema and no nasal discharge.   Neck:     no masses.  points to  tenderness at base midline  some muscle spasm of trapezius Lungs:     Normal respiratory effort, chest expands symmetrically. Lungs are clear to auscultation, no crackles or wheezes. Heart:     Normal rate and regular rhythm. S1 and S2 normal without gallop, murmur, click, rub or other extra sounds. Abdomen:     Bowel sounds positive,abdomen soft and non-tender without masses, organomegaly or hernias noted. Extremities:     no edema and nl cap refill   Neurologic:     alert & oriented X3, strength normal in all extremities, and gait normal.  dtrs present   no clonus or tremor Skin:     turgor normal, color normal, no ecchymoses, and no petechiae.   Cervical Nodes:     no anterior cervical adenopathy and no posterior cervical adenopathy.   Psych:     Oriented X3, normally interactive, and good eye contact.   Additional Exam:     see   er notes and labs cpk 232    Impression & Recommendations:  Problem # 1:  NUMBNESS, ARM (ICD-782.0) Assessment: Improved this seems like  cervical radicular symptom s   with the neck tightness and left arm symptom .... no weakness today  and may need ortho eval  cant do mri because of pacer   Problem # 2:  NECK PAIN (ICD-723.1) asabove  Her updated medication list for this problem includes:    Aspirin 81 Mg Tabs (Aspirin)    Methocarbamol 500 Mg Tabs (Methocarbamol) .Marland Kitchen... 1 by mouth three times a day as needed  muscle spasm   Problem # 3:  CPK, ABNORMAL (ICD-790.5) mildly elevated  is off statin and will have Dr Tenny Craw opine on  meds   Problem # 4:  DYSAUTONOMIA POTS (ICD-742.8) Assessment: Comment Only  Problem # 5:  ? of HYPOGLYCEMIA (ICD-251.2) by report    fasting    with reactive elevated BG      agreee with  endo consult.    in the meantime  small frequent meals and  avoid simple carbs  unless needed   keep a log to get to endo .  Complete Medication List: 1)  Catapres-tts-2 0.2 Mg/24hr Ptwk (Clonidine hcl) 2)  Catapres 0.1 Mg Tabs (Clonidine hcl) .Marland Kitchen.. 1 by mouth at bedtime 3)  Wellbutrin Xl 150 Mg Tb24 (Bupropion hcl) .Marland Kitchen.. 1 by mouth once daily 4)  Phenobarbital 64.8 Mg Tabs (Phenobarbital) .Marland Kitchen.. 1 by mouth at bedtime 5)  Lopressor 50 Mg Tabs (Metoprolol tartrate) .Marland Kitchen.. 1 by mouth three times a day 6)  Zofran 8 Mg Tabs (Ondansetron hcl) .... As needed 7)  Aspirin 81 Mg Tabs (Aspirin) 8)  Klor-con M20 20 Meq Tbcr (Potassium chloride crys cr) .... 4 by mouth once daily 9)  Protonix 40 Mg Tbec (Pantoprazole sodium) .Marland Kitchen.. 1 by mouth once daily 10)  Labetalol Hcl 200 Mg Tabs (Labetalol hcl) .Marland Kitchen.. 1 by mouth two times a day 11)  Zocor 20 Mg Tabs (Simvastatin) .Marland Kitchen.. 1 by mouth once daily 12)  Methocarbamol 500 Mg Tabs (Methocarbamol) .Marland Kitchen.. 1 by mouth three times a day as needed muscle spasm   Patient Instructions: 1)  stop the zocor for now and rec get CPK  repeated in 2-3 weeks . 2)  THen  we should get  Dr Tenny Craw to advise on restarting a statin med.   3)  IBuprofen  800 mg  every 8 hours if needed  .  4)  Consider seeing    ortho about the  neck pain and hx of arm numbness. 5)  See what Dr Talmage Nap says about the hypoglycemia.    Prescriptions: METHOCARBAMOL 500 MG  TABS (METHOCARBAMOL) 1 by mouth three times a day as needed muscle spasm  #40 x 1   Entered and Authorized by:   Madelin Headings MD   Signed by:   Madelin Headings MD on 09/26/2007   Method used:   Electronically sent to ...        [facility]       [address]       [place], [name]  [address]       Ph: [phone]       Fax: [phone]   RxID:   [id number]  ]

## 2010-03-30 NOTE — Cardiovascular Report (Signed)
Summary: Office Visit   Office Visit   Imported By: Roderic Ovens 04/18/2009 16:28:44  _____________________________________________________________________  External Attachment:    Type:   Image     Comment:   External Document

## 2010-03-30 NOTE — Letter (Signed)
Summary: [facility]  [address] [place], [name] [address]   Phone: [phone]  Fax: [phone]       [name]    [birth year]    MRN: [id number]        Procedure Day /Date: 03/01/09 (Tuesday)     Arrival Time: 10:30 am     Procedure Time: 11:30 am     Location of Procedure:                    _ x_  [facility] (4th Floor)  PREPARATION FOR FLEXIBLE SIGMOIDOSCOPY WITH FLEETS ENEMAS  Prior to the day before your procedure, purchase 2 Fleets Enema from the laxative section of your drugstore.  _________________________________________________________________________________________________  THE DAY BEFORE YOUR PROCEDURE             DATE: 02/28/09    DAY: Monday  1.   Have a clear liquid dinner the night before your procedure.  2.   Do not drink anything colored red or purple.  Avoid juices with pulp.  No orange juice.              CLEAR LIQUIDS INCLUDE: Water Jello Ice Popsicles Tea (sugar ok, no milk/cream) Powdered fruit flavored drinks Coffee (sugar ok, no milk/cream) Gatorade Juice: apple, white grape, white cranberry  Lemonade Clear bullion, consomm, broth Carbonated beverages (any kind) Strained chicken noodle soup Hard Candy   3.   Drink at least 4 glasses of clear liquids before bedtime (preferably juices).    ___________________________________________________________________________________________________  THE DAY OF YOUR PROCEDURE            DATE: 03/01/09   DAY: Tuesday  1.   Use Fleet Enema two hours prior to coming for procedure.  2.   Use second Fleet Enema one hour prior to coming for procedure.  2.   You may drink clear liquids until 9:30 am (2 hours before exam)       MEDICATION INSTRUCTIONS  Unless otherwise instructed, you should take regular prescription medications with a small sip of water as early as possible the morning of your procedure.        OTHER INSTRUCTIONS  You  will need a responsible adult at least 53 years of age to accompany you and drive you home.   This person must remain in the waiting room during your procedure.  Wear loose fitting clothing that is easily removed.  Leave jewelry and other valuables at home.  However, you may wish to bring a book to read or an iPod/MP3 player to listen to music as you wait for your procedure to start.  Remove all body piercing jewelry and leave at home.  Total time from sign-in until discharge is approximately 2-3 hours.  You should go home directly after your procedure and rest.  You can resume normal activities the day after your procedure.  The day of your procedure you should not:   Drive   Make legal decisions   Operate machinery   Drink alcohol   Return to work  You will receive specific instructions about eating, activities and medications before you leave.   The above instructions have been reviewed and explained to me by   Hortense Ramal, CMA    I fully understand and can verbalize these instructions _____________________________ Date 02/01/09

## 2010-03-30 NOTE — Letter (Signed)
Summary: Appt Reminder 2  [facility]  [address] [place], [name] [address]   Phone: [phone]  Fax: [phone]        April 04, 2009 MRN: [id number]    [facility] [address] [place], [name]  [address]    Dear Ms. [facility],   You have a return appointment with Dr.Vivyan Biggers [facility] on 04-20-09 at 9am. Please remember to bring a complete list of the medicines you are taking, your insurance card and your co-pay.  If you have to cancel or reschedule this appointment, please call before 5:00 pm the evening before to avoid a cancellation fee.  If you have any questions or concerns, please call [phone].    Sincerely,    Laureen Ochs LPN  Appended Document: Appt Reminder 2 Letter mailed to patient.

## 2010-03-30 NOTE — Cardiovascular Report (Signed)
Summary: Office Visit   Office Visit   Imported By: Roderic Ovens 05/12/2009 13:27:12  _____________________________________________________________________  External Attachment:    Type:   Image     Comment:   External Document

## 2010-03-30 NOTE — Letter (Signed)
Summary: MD order for labs  MD order for labs   Imported By: Kassie Mends 07/19/2008 10:26:43  _____________________________________________________________________  External Attachment:    Type:   Image     Comment:   External Document

## 2010-03-30 NOTE — Progress Notes (Signed)
Summary: returning call  Phone Note Call from Patient   Caller: Patient Reason for Call: Talk to Nurse Summary of Call: pt returned call-pls call [phone] Initial call taken by: Glynda Jaeger,  November 11, 2009 10:41 AM  Follow-up for Phone Call        Pt no longer holding Simvastatin.  She is now taking Simvastatin 10 mg once daily.  Pt requesting 90 day supply. [facility] [address].  Medication list reconciled Follow-up by: Judithe Modest CMA,  November 11, 2009 11:20 AM    New/Updated Medications: SIMVASTATIN 10 MG TABS (SIMVASTATIN) Take one tablet by mouth daily at bedtime Prescriptions: SIMVASTATIN 10 MG TABS (SIMVASTATIN) Take one tablet by mouth daily at bedtime  #90 x 3   Entered by:   Judithe Modest CMA   Authorized by:   Sherrill Raring, MD, [facility]   Signed by:   Judithe Modest CMA on 11/11/2009   Method used:   Electronically to        [id number] (retail)       [address]       [place], [name]  [address]       Ph: [phone]       Fax: [phone]   RxID:   [id number]

## 2010-03-30 NOTE — Progress Notes (Signed)
  Phone Note Outgoing Call   Call placed by: Duncan Dull, RN, BSN,  October 19, 2008 10:28 AM Call placed to: Patient Summary of Call: NO ANSWER. Called patient and left message on machine PER DR. KLEIN, PT NEEDS TO START MULTAQ 400MG  two times a day. WILL NEED TO BE FAXED TO PHARMACY OF PT'S CHOICE.  Follow-up for Phone Call        PT IS IN AGREEMENT. RX FAXED  Follow-up by: Duncan Dull, RN, BSN,  October 19, 2008 12:34 PM    New/Updated Medications: MULTAQ 400 MG TABS (DRONEDARONE HCL) TAKE ONE TABLET TWICE DAILY [BMN] Prescriptions: MULTAQ 400 MG TABS (DRONEDARONE HCL) TAKE ONE TABLET TWICE DAILY Brand medically necessary #60 x 2   Entered by:   Duncan Dull, RN, BSN   Authorized by:   Nathen May, MD, [facility]   Signed by:   Duncan Dull, RN, BSN on 10/19/2008   Method used:   Electronically to        [facility] (retail)       [address]       [place], [name]  [address]       Ph: [phone]       Fax: [phone]   RxID:   [id number]

## 2010-03-30 NOTE — Progress Notes (Signed)
Summary: titers ct & other labs normal  Phone Note Call from Patient Call back at [phone] or [phone]   Caller: Patient live Call For: Panosh Summary of Call: Patient is having nerulogic  problem,weakness, feels disroded,jettery in legs.  Patient had a bite on ankle it swelled and left a dark spot.  Patient also had rocky spoted fever it was in the last 5 years.  She is also a cardiolgy patient she call their office and talked to Leigh and she said she needed to be seen today. Initial call taken by: Celine Ahr,  September 17, 2007 10:14 AM  Follow-up for Phone Call        Spoke to pt.  Bitten by insect (not sure what) 3 weeks ago on ankle.  Had extreme swelling and pain.  Now having joint pain, neck pain, problems with speech, difficulty with leg weakness.  BSs are fluctuating from 35-196 History of [facility] Spotted Fever 5 years ago. Spoke to Cardiologist's office, and they feel she needs appt with Dr. Fabian Sharp today. [phone] Follow-up by: Lynann Beaver CMA,  September 17, 2007 10:38 AM  Additional Follow-up for Phone Call Additional follow up Details #1::        Spoke to pt- she is having on set speech difficulty and unable to walk on legs that within 24 hours. No fever. Per Dr. Fabian Sharp- pt may be having a stroke and needs to go to ed at [facility]. Pt understands and is going over there. Additional Follow-up by: Romualdo Bolk, CMA,  September 17, 2007 10:56 AM    Additional Follow-up for Phone Call Additional follow up Details #2::    Did go to ER & had CT & Bloodwork.  Everything normal.  Only thing they didn't have were the titer levels for [facility] & Lyme.  Doctor said to call you & ask you to look for those & treat, that she'd rather Dr. Fabian Sharp handle that.  Call me at [phone] or [phone]. Rudy Jew, RN  September 17, 2007 3:32 PM Per DR. Panosh- do ceftin 500mg  1 by mouth two times a day for 2 weeks. Rx sent thru EMR. Romualdo Bolk, CMA  September 17, 2007  5:28 PM   Additional Follow-up for Phone Call Additional follow up Details #3:: Details for Additional Follow-up Action Taken: make sure she has a follow up visit  in the next 1-2 weeks to follow up .    Additional Follow-up by: Madelin Headings MD,  September 18, 2007 5:08 PM  New/Updated Medications: CEFTIN 500 MG  TABS (CEFUROXIME AXETIL) 1 by mouth two times a day for 2 weeks or as directed   Prescriptions: CEFTIN 500 MG  TABS (CEFUROXIME AXETIL) 1 by mouth two times a day for 2 weeks or as directed  #30 x 0   Entered by:   Romualdo Bolk, CMA   Authorized by:   Madelin Headings MD   Signed by:   Romualdo Bolk, CMA on 09/17/2007   Method used:   Electronically sent to ...       [facility]       [address]       [place], [name]  [address]       Ph: [phone]       Fax: [phone]   RxID:   [id number]    Pt made appt for next week. Romualdo Bolk,  CMA  September 19, 2007 9:00 AM

## 2010-03-30 NOTE — Miscellaneous (Signed)
Summary: Home Health Certification/Care Plan   Home Health Certification/Care Plan   Imported By: Roderic Ovens 03/09/2010 16:14:18  _____________________________________________________________________  External Attachment:    Type:   Image     Comment:   External Document

## 2010-03-30 NOTE — Progress Notes (Signed)
  Phone Note Call from Patient   Caller: Patient Summary of Call: Pt would like to speak to you ASAP! Has some issues to discuss and issues from her appt with Dr. Johney Frame. Duncan Dull, RN, BSN  September 30, 2008 10:19 AM   Follow-up for Phone Call        will call Follow-up by: Nathen May, MD, [facility],  September 30, 2008 2:54 PM

## 2010-03-30 NOTE — Assessment & Plan Note (Signed)
Summary: SEVERE ABD. PAIN   Amanda    History of Present Illness Visit Type: follow up Primary GI MD: Amanda Sar MD Primary Provider: Berniece Andreas MD Requesting Provider: Berniece Andreas MD Chief Complaint: LUQ pain, severe, onset 12-03-07 History of Present Illness:   [name] is worked in today for severe LLQ pain. She has been seen by [name] in the past for this same time pain which was felt to be secondary to diverticulitis. She last saw [name] back in Dec. 2008. At that time it was decided the patient would have a colonoscopy for recurrent LLQ pain.  Her colonscopy was done June 2008 and revealed sigmoid diverticulosis. Because of a history of chronic diarrhea random biopsies were done and they were normal.  According to her records the patient apparently had a CT in 2004 during one of her episodes of LLQ pain. The CT was reportedly negative for diverticulitis. Since 2004 the patient was had two or three more episodes of LLQ pain for which she was treated with antibiotics and got better. Her current LLQ pain started last Wed.  Describes pain as being like previous episodes. Pain does not radiate, is not positional, is not related to meals or defecation. Motrin helps but doesn't alleviate pain. BMs are unchanged. No dysuria. No vaginal discharge. Low grade temp recently of 99. No nausea or vomiting.   GI Review of Systems    Reports abdominal pain.     Location of  Abdominal pain: LLQ.    Denies acid reflux, belching, bloating, chest pain, dysphagia with liquids, dysphagia with solids, heartburn, loss of appetite, nausea, vomiting, vomiting blood, weight loss, and  weight gain.        Denies anal fissure, black tarry stools, change in bowel habit, constipation, diarrhea, diverticulosis, fecal incontinence, heme positive stool, hemorrhoids, irritable bowel syndrome, jaundice, light color stool, liver problems, rectal bleeding, and  rectal pain.     Updated Prior Medication  List: CATAPRES-TTS-2 0.2 MG/24HR  PTWK (CLONIDINE HCL)  CATAPRES 0.1 MG  TABS (CLONIDINE HCL) 1 by mouth at bedtime WELLBUTRIN XL 150 MG  TB24 (BUPROPION HCL) 1 by mouth once daily PHENOBARBITAL 64.8 MG  TABS (PHENOBARBITAL) 1 by mouth at bedtime LOPRESSOR 50 MG  TABS (METOPROLOL TARTRATE) 1 by mouth three times a day ZOFRAN 8 MG  TABS (ONDANSETRON HCL) as needed ASPIRIN 81 MG  TABS (ASPIRIN)  KLOR-CON M20 20 MEQ  TBCR (POTASSIUM CHLORIDE CRYS CR) 4 by mouth once daily PROTONIX 40 MG  TBEC (PANTOPRAZOLE SODIUM) 1 by mouth once daily LABETALOL HCL 200 MG  TABS (LABETALOL HCL) 1 by mouth two times a day ZOCOR 20 MG  TABS (SIMVASTATIN) 1 by mouth once daily METHOCARBAMOL 500 MG  TABS (METHOCARBAMOL) 1 by mouth three times a day as needed muscle spasm  Current Allergies (reviewed today): ! PENICILLIN ! SULFA ! DOXYCYCLINE ! ERYTHROMYCIN  Past Medical History:    Reviewed history from 09/26/2007 and no changes required:       Osteopenia       POTS dysautomia       self cath urine        g0p0       nasal surgery for nasal bleeding           Past Surgical History:    Reviewed history from 06/25/2007 and no changes required:       Permanent pacemaker       Fatty tumors removed, chest wall.  Nasal surgeries   Family History:    Reviewed history from 09/26/2007 and no changes required:       Family History High cholesterol       Family History Hypertension       cad, cva parents          Social History:    Reviewed history from 06/25/2007 and no changes required:       hh of 2        remote smoker        Alcohol use-no       self employed     Review of Systems       The patient complains of fever.  The patient denies anorexia, weight loss, weight gain, vision loss, decreased hearing, hoarseness, chest pain, syncope, dyspnea on exertion, peripheral edema, prolonged cough, headaches, hemoptysis, abdominal pain, melena, hematochezia, severe indigestion/heartburn,  hematuria, incontinence, genital sores, muscle weakness, suspicious skin lesions, transient blindness, difficulty walking, depression, unusual weight change, abnormal bleeding, enlarged lymph nodes, angioedema, breast masses, and testicular masses.     Vital Signs:  Patient Profile:   55 Years Old Female Height:     63 inches Weight:      153.13 pounds BMI:     27.22 Pulse rate:   72 / minute Pulse rhythm:   regular BP sitting:   110 / 68  (left arm)  Vitals Entered By: [name] CMA (December 08, 2007 10:49 AM)                  Physical Exam  General:     Well developed, well nourished, no acute distress. Head:     Normocephalic and atraumatic. Eyes:     conjunctiva pink, no icterus.  Mouth:     No deformity or lesions, dentition normal. Neck:     Supple; no masses or thyromegaly. Lungs:     Clear throughout to auscultation. Heart:     Regular rate and rhythm; no murmurs, rubs,  or bruits. Abdomen:     Abdomen soft, nondistended. There is significant LLQ tenderness. No guarding. No obvious masses or hepatomegaly. No obvious hernias. Normal bowel sounds.  Msk:     Symmetrical with no gross deformities. Normal posture. Extremities:     No clubbing, cyanosis, edema or deformities noted. Neurologic:     Alert and  oriented x4;  grossly normal neurologically. Skin:     Intact without significant lesions or rashes. Cervical Nodes:     No significant cervical adenopathy. Psych:     Alert and cooperative. Normal mood and affect.    Impression & Recommendations:  Problem # 1:  ABDOMINAL PAIN, LEFT LOWER QUADRANT (ICD-789.04) Likely diverticulitis. Patient had normal complete colonoscopy (except sigmoid diverticulosis)  by [name] in June 2008. Will obtain CT abd./ pelvis ASAP given this is third or so episode. Will get CBC. Patient has contrast allergy so will premedicate using Prednisone protocol. Patient to start Flagyl and Cipro today and take for 10 days.  Clear liquid diet and advance slowly. Vicodin given for pain, cautioned against operating machinery. 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 Rema Jasmine in [place].   Problem # 3:  PACEMAKER, PERMANENT (ICD-V45.01) Followed by [facility].  Other Orders: TLB-CBC Platelet - w/Differential (85025-[id number])   Patient Instructions: 1)  Clear liquid diet for the next 24 hours, then slowly progress diet as  tolerated. 2)  Please go to our lab before leaving today.  3)  CT scheduled for [facility] for 12-10-07 and you need to arrive at 8:30AM.  Drink the cold contrast , one bottle at 7AM and 8AM.  You will have to pick up the benedryl and prednisone at [facility]. I would call [facility] later today to see if they have it ready.  You need to take it Tues 12-09-07.   4)  The script for vicodin wasI faxed  to [facility].   5)  Copy Sent To:Amanda Andreas MD    Prescriptions: VICODIN 5-500 MG TABS (HYDROCODONE-ACETAMINOPHEN) Take 1 tab every 6 hours as needed for pain  #20 x 0   Entered by:   [name] CMA   Authorized by:   Willette Cluster NP   Signed by:   [name] CMA on 12/08/2007   Method used:   Printed then faxed to ...       [facility] (retail)       [address]       [place], [name]  [address]       Ph: [phone]       Fax: [phone]   RxID:   [id number] CIPRO 250 MG TABS (CIPROFLOXACIN HCL) Take 1 tab twice daily x 10 days  #20 x 0   Entered by:   [name] CMA   Authorized by:   Willette Cluster NP   Signed by:   [name] CMA on 12/08/2007   Method used:   Electronically to        [facility] (retail)       [address]       [place], [name]  [address]       Ph: [phone]       Fax: [phone]   RxID:    [id number] FLAGYL 500 MG TABS (METRONIDAZOLE) Take 1 tab e times daily x 10 days  #30 x 0   Entered by:   [name] CMA   Authorized by:   Willette Cluster NP   Signed by:   [name] CMA on 12/08/2007   Method used:   Electronically to        [facility] (retail)       [address]       [place], [name]  [address]       Ph: [phone]       Fax: [phone]   RxID:   [id number]  ]

## 2010-03-30 NOTE — Progress Notes (Signed)
Summary: Tachycardia and  Med Change  Phone Note Other Incoming    Follow-up for Phone Call        Pt. called to report that she did not tolerate increasing the dose of metoprolol due to profound fatigue and feeling like she was "going to pass out" so she went back on her original dose.  She states that taking the Labetalol total of 400 mg  in the am has helped with a decrease amount of tachycardia. Advised pt. that I will let Dr.Klein know. Follow-up by: J REISS RN    New/Updated Medications: LABETALOL HCL 200 MG TABS (LABETALOL HCL) Take two tablets by mouth every day

## 2010-03-30 NOTE — Cardiovascular Report (Signed)
Summary: Office Visit   Office Visit   Imported By: Roderic Ovens 10/06/2008 11:31:23  _____________________________________________________________________  External Attachment:    Type:   Image     Comment:   External Document

## 2010-03-30 NOTE — Progress Notes (Signed)
Summary: Pt. rescheduled Sigmoidoscopy  Phone Note Call from Patient Call back at Home Phone [phone]   Caller: Patient Call For: Deni Lefever Reason for Call: Talk to Nurse Summary of Call: Patient wants to speak to nurse regarding biopsy she had done last week on her back.Marland KitchenMarland KitchenPatient states that she is schedule for a procedure tomorrow and wantsd to make sure that she does not have cancer lesions on her back before proceeding with procedure. Initial call taken by: Tawni Levy,  February 28, 2009 3:43 PM  Follow-up for Phone Call        Pt. is scheduled for a flex on 03-01-09. Doing very well on the Miralax . Pt. had Melonoma on right arm last year. She had her 1 year check-up last week, a spot was found on her back and it was biopsied.  Pt. hasn't gotten the biopsy report back yet and she is very concerned, she wants to reschedule the Flex, states she is just too stressed to have procedure tomorrow. She has rescheduled it to 03-24-09 at 1:30pm in [facility]. All instructions have been updated with pt. by phone. Pt. instructed to call back as needed.  Follow-up by: Laureen Ochs LPN,  February 28, 2009 4:35 PM  Additional Follow-up for Phone Call Additional follow up Details #1::        OK Additional Follow-up by: Hart Carwin MD,  February 28, 2009 5:29 PM

## 2010-03-30 NOTE — Progress Notes (Signed)
Summary: ANTIBIOTICS CAUSED YEAST INFECTION   Phone Note Call from Patient Call back at Work Phone [phone] Call back at cell phone   Call For: DR Leone Payor Reason for Call: Talk to Nurse Summary of Call: Antibiotics that Roberts PA  gave her caused  a yeast infection and wonders if we can call in some diflucan to [facility] on [address] Called last night to oncall Dr and no one returned her call. Also wants to speak to [facility] about her diet. Initial call taken by: Leanor Kail [facility],  December 15, 2007 1:20 PM  Follow-up for Phone Call        [facility] and asked her about Diflucan, Willette Cluster RNP said I could call in 1 Diflucan to pt's pharmacy for her yeast infection.  I also discussed diet with the pt.  She had tried some baked fish and baked potato and baked spinich over the weekend and this gave her some minor pain.  I told her to hold off low residue for a few more days and try it the end of the week .  I asked her to call me once she does and let me know how she is doing.  Lowry Ram CMA  December 15, 2007 4:43 PM     New/Updated Medications: DIFLUCAN 150 MG TABS (FLUCONAZOLE) Take 1 tab for yeast infection.   Prescriptions: DIFLUCAN 150 MG TABS (FLUCONAZOLE) Take 1 tab for yeast infection.  #1 x 0   Entered by:   Lowry Ram CMA   Authorized by:   Willette Cluster NP   Signed by:   Lowry Ram CMA on 12/15/2007   Method used:   Electronically to        [facility] (retail)       [address]       [place], [name]  [address]       Ph: [phone]       Fax: [phone]   RxID:   [id number]

## 2010-03-30 NOTE — Progress Notes (Signed)
Summary: follow-up on info from [place]  Phone Note Call from Patient Call back at Home Phone [phone] Call back at Work Phone [phone]   Caller: Patient Summary of Call: pt waiting on information from the dr... to make sure he got info from [facility] Initial call taken by: Migdalia Dk,  October 12, 2008 10:26 AM  Follow-up for Phone Call        No answer. Called patient and left message on machine. We did receive information. Follow-up by: Duncan Dull, RN, BSN,  October 12, 2008 3:33 PM

## 2010-03-30 NOTE — Letter (Signed)
Summary: Home Health Cert & Plan of Care  Home Health Cert & Plan of Care   Imported By: Marylou Mccoy 03/20/2010 15:32:35  _____________________________________________________________________  External Attachment:    Type:   Image     Comment:   External Document

## 2010-04-05 NOTE — Letter (Signed)
[facility]: [facility]   Imported By: Marylou Mccoy 03/27/2010 14:58:41  _____________________________________________________________________  External Attachment:    Type:   Image     Comment:   External Document

## 2010-04-25 ENCOUNTER — Encounter: Payer: Self-pay | Admitting: Internal Medicine

## 2010-04-27 ENCOUNTER — Encounter: Payer: Self-pay | Admitting: Internal Medicine

## 2010-04-27 ENCOUNTER — Ambulatory Visit (INDEPENDENT_AMBULATORY_CARE_PROVIDER_SITE_OTHER): Payer: BC Managed Care – PPO | Admitting: Internal Medicine

## 2010-04-27 DIAGNOSIS — Q078 Other specified congenital malformations of nervous system: Secondary | ICD-10-CM

## 2010-04-27 DIAGNOSIS — R002 Palpitations: Secondary | ICD-10-CM

## 2010-04-27 DIAGNOSIS — R42 Dizziness and giddiness: Secondary | ICD-10-CM | POA: Insufficient documentation

## 2010-05-02 LAB — CONVERTED CEMR LAB
Bacteria, UA: NONE SEEN
Bilirubin Urine: NEGATIVE
Blood, UA: NEGATIVE
Casts: NONE SEEN /lpf
Crystals: NONE SEEN
Ketones, ur: NEGATIVE mg/dL
Nitrite: NEGATIVE
Protein, ur: NEGATIVE mg/dL
Specific Gravity, Urine: 1.013 (ref 1.005–1.030)
Squamous Epithelial / LPF: NONE SEEN /lpf
Urine Glucose: NEGATIVE mg/dL
Urobilinogen, UA: 0.2 (ref 0.0–1.0)
pH: 7 (ref 5.0–8.0)

## 2010-05-04 NOTE — Assessment & Plan Note (Signed)
Summary: ov. weakness, per dr. Horald Pollen. gd   Referring Provider:  n/a Primary Provider:  Berniece Andreas MD   History of Present Illness: Patient is a 55 year old with a history of dysautonomia and atrial tachycardia.  I saw her in the fall in clinic. I saw her once not that long ago in [facility]acer cl[facility]  She was having a lot of high rates and I adjusted her b blocker.   Today she comes in feeling dizzy, like room is spinning at times.   Feels heart racing.  Tired.  Washed out. Has gone on for the past 2 wks.  NOtes having an upper resp infection a few wks ago  Notes that her palpitaotns are better since changing med doses.  Current Medications (verified): 1)  Catapres-Tts-2 0.2 Mg/24hr  Ptwk (Clonidine Hcl) .... Once A Week 2)  Catapres 0.1 Mg  Tabs (Clonidine Hcl) .Marland Kitchen.. 1 By Mouth At Bedtime 3)  Wellbutrin Xl 150 Mg  Tb24 (Bupropion Hcl) .Marland Kitchen.. 1 By Mouth Once Daily 4)  Zofran 8 Mg  Tabs (Ondansetron Hcl) .... As Needed 5)  Aspirin 81 Mg  Tabs (Aspirin) .Marland Kitchen.. 1 Tab Once Daily 6)  Klor-Con M20 20 Meq  Tbcr (Potassium Chloride Crys Cr) .... 4 By Mouth Once Daily 7)  Protonix 40 Mg  Tbec (Pantoprazole Sodium) .... As Needed 8)  Labetalol Hcl 200 Mg Tabs (Labetalol Hcl) .... One By Mouth Two Times A Day 9)  Iv Hydration Once Weekly 10)  Vitamin B12 Injection .... Once A Month 11)  Miralax  Powd (Polyethylene Glycol 3350) .... Take 1/2 Capful Dissolved in At Least 8 Ounces of Water/juice Once Daily. 12)  Metoprolol Succinate 100 Mg Xr24h-Tab (Metoprolol Succinate) .... Take One and A Half Tablets Every Day 13)  Metoprolol Tartrate 50 Mg Tabs (Metoprolol Tartrate) .... As Needed 14)  Vitamin D3 50000 Unit Caps (Cholecalciferol) .Marland Kitchen.. 1 Cap Every Day 15)  Phenobarbital 64.8 Mg Tabs (Phenobarbital) .Marland Kitchen.. 1 Tab At Bedtime 16)  Alprazolam 0.25 Mg Tabs (Alprazolam) .... Take One Half A Tab 2 Times Per Day As Needed For Anxiety  Allergies (verified): 1)  ! Penicillin 2)  ! Sulfa 3)  ! Doxycycline 4)  !  Erythromycin  Past History:  Past medical, surgical, family and social histories (including risk factors) reviewed, and no changes noted (except as noted below).  Past Medical History: Reviewed history from 11/01/2009 and no changes required. HISTORY OF MALIGNANT MELANOMA OF SKIN (ICD-V10.82) ARM PAIN, LEFT (ICD-729.5) INJURY UNSPEC NERVE SHOULDER GIRDLE&UPPER LIMB (ICD-955.9) diverticulitis ? 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 PACEMAKER, PERMANENT (ICD-V45.01)-placed 1999 RF Ablation-2002 Fatty tumors removed, chest wall. Nasal surgeries Melanoma removal T & A Carpal Tunnel Release  Family History: Reviewed history from 04/20/2009 and no changes required. Family History High cholesterol Family History Hypertension cad, cva parents  Family History of Diabetes: Maternal Grandmother, Paternal Grandmother Family History of Heart Disease: Mother, Father No FH of Colon Cancer:  Social History: Reviewed history from 11/01/2009 and no changes required. Married no children  Alcohol use-no self employed  Patient has never smoked.  Illicit Drug Use - no  Review of Systems  Reviewed. systems.  Neg to the above problem except as noted above.  Vital Signs:  Patient profile:   55 year old female Menstrual status:  postmenopausal Height:      63 inches Weight:      149 pounds BMI:     26.49 Pulse rate:   72 / minute Pulse (ortho):   79 / minute Pulse rhythm:   regular BP sitting:   141 / 98  (left arm) BP standing:   136 / 80 Cuff size:   regular  Vitals  Entered By: Judithe Modest CMA (April 27, 2010 9:54 AM)  Serial Vital Signs/Assessments:  Time      Position  BP       Pulse  Resp  Temp     By 10:13 AM  Lying LA  142/85   74                    Amanda Trulove CMA 10:13 AM  Sitting   141/91   76                    Amanda Trulove CMA 10:13 AM  Standing  136/80   79                    Amanda Trulove CMA  Comments: 10:13 AM 2 minutes-147/88 HR 79 3 minutes-130/89 HR 81  Pt reports shakiness, dizziness and visual changes upon standing By: Judithe Modest CMA    Physical Exam  Additional Exam:  Patient looks tired but in NAD HEENT:  Normocephalic, atraumatic. EOMI, PERRLA.    Neck: JVP is normal. No thyromegaly. No bruits.  Lungs: clear to auscultation. No rales no wheezes.  Heart: Regular rate and rhythm. Normal S1, S2. No S3.   No significant murmurs. PMI not displaced.  Abdomen:  Supple, nontender. Normal bowel sounds. No masses. No hepatomegaly.  Extremities:   Good distal pulses throughout. No lower extremity edema.  Musculoskeletal :moving all extremities.  Neuro:   alert and oriented x3.  No nystagmus.  Eyes do stop at midline.   PPM Specifications Following MD:  Sherryl Manges, MD     Referring MD:  Sidra Oldfield PPM Vendor:  [employer]     PPM Model Number:  [id number]     PPM Serial Number:  [id number] PPM DOI:  11/09/2003     PPM Implanting MD:  Sherryl Manges, MD  Lead 1    Location: RA     DOI: 05/04/1997     Model #: [id number]     Serial #: [id number]     Status: active Lead 2    Location: RV     DOI: 05/04/1997     Model #: [id number]     Serial #: [id number]     Status: active  Magnet Response Rate:  BOL 85 ERI  65  Indications:  Syncope   PPM Follow Up Pacer Dependent:  No      Episodes Coumadin:  No  Parameters Mode:  MVP     Lower Rate Limit:  60     Upper Rate Limit:  130 Paced AV Delay:  250     Sensed AV Delay:  200  Impression & Recommendations:  Problem # 1:  DIZZINESS (ICD-780.4) Patient not feeling well.  History is a  ltitle difficult. Todays symptoms are a little different than her usual orthostatic dizziness.  Question vertigo/vestibular dysfunction. I have recomm a trial of meclizine   If  this doesn't work I would recomm a referral for vestibular testing. She is not orthostatic on exam. Labs done at  Dr. Gilman Schmidt office were not remarkable. Will check UA.  Problem # 2:  DYSAUTONOMIA-POTS (ICD-742.8) COntinue current meds.  Problem # 3:  ATRIAL TACHYCARDIA (ICD-427.89) Improved. Her updated medication list for this problem includes:    Aspirin 81 Mg Tabs (Aspirin) .Marland Kitchen... 1 tab once daily    Labetalol Hcl 200 Mg Tabs (Labetalol hcl) ..... One by mouth two times a day    Metoprolol Succinate 100 Mg Xr24h-tab (Metoprolol succinate) .Marland Kitchen... Take one and a half tablets every day    Metoprolol Tartrate 50 Mg Tabs (Metoprolol tartrate) .Marland Kitchen... As needed  Other Orders: T-Urinalysis ([id number]) Misc. Referral (Misc. Ref)  Patient Instructions: 1)  Your physician recommends that you return for lab work in: urine test today...we will call you with results 2)  Your physician wants you to follow-up in: 6 months with Dr.Kristjan Derner    You will receive a reminder letter in the mail two months in advance. If you don't receive a letter, please call our office to schedule the follow-up appointment. 3)  [facility] for dizziness...will call you back with appointment Prescriptions: ANTIVERT 25 MG TABS (MECLIZINE HCL) 1 tab every 8 hours for dizziness  #90 x 1   Entered by:   Layne Benton, RN, BSN   Authorized by:   Sherrill Raring, MD, [facility]   Signed by:   Layne Benton, RN, BSN on 04/27/2010   Method used:   Electronically to        [facility] [address] (retail)       [address]       [place], [name]  [address]       Ph: [phone]       Fax: [phone]   RxID:   [id number]

## 2010-05-04 NOTE — Cardiovascular Report (Signed)
Summary: Office Visit   Office Visit   Imported By: Roderic Ovens 04/24/2010 15:41:58  _____________________________________________________________________  External Attachment:    Type:   Image     Comment:   External Document

## 2010-05-05 ENCOUNTER — Encounter: Payer: BC Managed Care – PPO | Admitting: Physical Therapy

## 2010-05-24 ENCOUNTER — Telehealth: Payer: Self-pay | Admitting: *Deleted

## 2010-05-24 NOTE — Telephone Encounter (Signed)
Only wants to talk to [facility].

## 2010-05-24 NOTE — Telephone Encounter (Signed)
Left message for pt to call back on both numbers

## 2010-05-25 NOTE — Telephone Encounter (Signed)
Spoke to pt- she wanted to let us know 1) over the last month starting 3/1 she was having spells of dizziness, fatigue and nervousness. They would come and go. She thought it was her blood sugar. She does see Dr. Horald Pollen for this. She was told by her that it wasn't her blood sugar and adrenal glands. So then she saw Dr. Tenny Craw and she said it wasn't cardiac related. So she went to Dr. Lazarus Salines and he done a work up. She was on Antivert for dizziness. She tested neg for vertigo. They gave her 2 mg of valium instead of Antivert. She was also having numbness and tingling in the back of her neck. She was seen by chiropractor who done that x-rays. So she saw Dr.Stern and they told her that this was something else. She is now going to see Dr. Sandria Manly on April 18 th. So she talked to Dr. [name] nurse and they think she may have had a TIA. They told her that she needs a CT Scan of her head. They told her to call us about this. I asked pt if she talked to Dr. Lazarus Salines about this and they said that they are unsure of what type of scan she needs since she has a pacemaker in her. I told pt that I would discuss this with Dr. Fabian Sharp and get back with her.  Per Dr. Fabian Sharp- who ever is worried about the TIA needs to call Dr. Sandria Manly to see if she can be worked in sooner. Because she doesn't now what type of scan that she needs.   Spoke to pt, she has called both Dr. Raye Sorrow office and Dr. [name] office and they have both called Dr. Imagene Gurney office to see if they could get her in sooner but they can't. Pt states that she is on a cancellation list for Dr. Imagene Gurney office as well.

## 2010-06-08 LAB — BASIC METABOLIC PANEL
BUN: 14 mg/dL (ref 6–23)
CO2: 23 mEq/L (ref 19–32)
Calcium: 9.9 mg/dL (ref 8.4–10.5)
Chloride: 105 mEq/L (ref 96–112)
Creatinine, Ser: 0.75 mg/dL (ref 0.4–1.2)
GFR calc Af Amer: >60 mL/min (ref >60)
GFR calc non Af Amer: >60 mL/min (ref >60)
Glucose, Bld: 120 mg/dL — ABNORMAL HIGH (ref 70–99)
Potassium: 4.2 mEq/L (ref 3.5–5.1)
Sodium: 138 mEq/L (ref 135–145)

## 2010-06-08 LAB — CBC
HCT: 41.7 % (ref 36.0–46.0)
Hemoglobin: 14.5 g/dL (ref 12.0–15.0)
MCHC: 34.7 g/dL (ref 30.0–36.0)
MCV: 87.6 fL (ref 78.0–100.0)
Platelets: 233 10*3/uL (ref 150–400)
RBC: 4.76 MIL/uL (ref 3.87–5.11)
RDW: 12.2 % (ref 11.5–15.5)
WBC: 5 10*3/uL (ref 4.0–10.5)

## 2010-06-14 ENCOUNTER — Ambulatory Visit
Admission: RE | Admit: 2010-06-14 | Discharge: 2010-06-14 | Disposition: A | Discharge status: Home / Self Care | Payer: BC Managed Care – PPO | Source: Ambulatory Visit | Attending: Neurology | Admitting: Neurology

## 2010-06-14 ENCOUNTER — Other Ambulatory Visit: Payer: Self-pay | Admitting: Neurology

## 2010-06-14 DIAGNOSIS — Q078 Other specified congenital malformations of nervous system: Secondary | ICD-10-CM

## 2010-06-14 DIAGNOSIS — R42 Dizziness and giddiness: Secondary | ICD-10-CM

## 2010-06-14 DIAGNOSIS — M5412 Radiculopathy, cervical region: Secondary | ICD-10-CM

## 2010-06-14 MED ORDER — IOHEXOL 300 MG/ML  SOLN
75.0000 mL | Freq: Once | INTRAMUSCULAR | Status: AC | PRN
  Administered 2010-06-14: 75 mL via INTRAVENOUS

## 2010-06-23 ENCOUNTER — Telehealth: Payer: Self-pay | Admitting: *Deleted

## 2010-06-23 NOTE — Telephone Encounter (Signed)
Patient called and advised that she saw Dr.Grubb in [place] on 5/25 and was told to not take any more Valium but to replace it with Klonopin .5mg  2 times per day. He could not send this to pharmacy because of [place] of [place] issue. Discussed with Dr.Ross and she was ok to be the ordering MD. Called to [facility]. Klonopin .5mg  2 times per day #60 with 1 refill. Patient aware to pick up.

## 2010-06-26 ENCOUNTER — Encounter: Payer: Self-pay | Admitting: Internal Medicine

## 2010-06-27 ENCOUNTER — Encounter: Payer: Self-pay | Admitting: Internal Medicine

## 2010-06-27 ENCOUNTER — Ambulatory Visit (INDEPENDENT_AMBULATORY_CARE_PROVIDER_SITE_OTHER): Payer: BC Managed Care – PPO | Admitting: Internal Medicine

## 2010-06-27 ENCOUNTER — Encounter (INDEPENDENT_AMBULATORY_CARE_PROVIDER_SITE_OTHER): Payer: BC Managed Care – PPO

## 2010-06-27 VITALS — BP 124/82 | HR 69 | Ht 63.0 in | Wt 148.8 lb

## 2010-06-27 DIAGNOSIS — R002 Palpitations: Secondary | ICD-10-CM

## 2010-06-27 DIAGNOSIS — I4891 Unspecified atrial fibrillation: Secondary | ICD-10-CM

## 2010-06-27 DIAGNOSIS — Z950 Presence of cardiac pacemaker: Secondary | ICD-10-CM

## 2010-06-27 DIAGNOSIS — I498 Other specified cardiac arrhythmias: Secondary | ICD-10-CM

## 2010-06-27 DIAGNOSIS — R209 Unspecified disturbances of skin sensation: Secondary | ICD-10-CM

## 2010-06-27 NOTE — Progress Notes (Signed)
HPI  [name] is a 55 y.o. female  seen in followup for pacer implanted for in the setting of  hyperadrenergic POTS. She also had a history of exercise induced tachycardia which was limited and in [place] was given a recommendation to try dronaderone which she hfailed to tolerate.  [name] is seen now following an episode of vertigo and numbnss earlier this spring for which she was seen by DRs Love, [name] and then [name] in [place] and the concern was that she may have had a stroke.  She has had a problem with epistaxis and there have been discussions re need for surgery.  In anticipation anticoaagulation was chosen to be limited to asa and plavix with the anticipation that full OAC would follow.  There have also been concerns as to whether her pacemaker is adequate to identify atrial fibrillation which is the supposed culprit here. Apparently  There was documented atria fibrillation in the past when she was being seen in [place].  There has been a recommendnation to change out her  Pacer and replace it with a Biotronik device for the CLS system.    She aslo is having nocturnal palpitations which are felt in her throat   Past Medical History  Diagnosis Date  . Cancer     melanoma of skin  . Arm pain, left   . 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 Diverticulitis   . Hypoglycemia, unspecified   . Other nonspecific abnormal serum enzyme levels   . Cervicalgia   . Disturbance of skin sensation   . Other specified congenital anomalies of nervous system   . Swelling of limb   . Disorder of bone and cartilage, unspecified   . Atrial tachycardia     Past Surgical History  Procedure Date  . Insert / replace / remove pacemaker     1999  . Ablation saphenous vein w/ rfa     2002  . Tonsillectomy and adenoidectomy   . Carpal tunnel release   . Nose surgery     Current Outpatient Prescriptions  Medication Sig Dispense Refill  . aspirin 325  MG tablet Take 325 mg by mouth daily.        Marland Kitchen buPROPion (WELLBUTRIN XL) 150 MG 24 hr tablet Take 150 mg by mouth daily.        . Cholecalciferol (VITAMIN D3) 50000 UNITS CAPS Take by mouth daily. 3 x weekly      . cloNIDine (CATAPRES) 0.1 MG tablet Take 0.1 mg by mouth at bedtime.        . clopidogrel (PLAVIX) 75 MG tablet Take 75 mg by mouth daily.        . Cyanocobalamin (VITAMIN B-12 IJ) Inject as directed every 30 (thirty) days.        Marland Kitchen labetalol (NORMODYNE) 200 MG tablet Take 200 mg by mouth 2 (two) times daily.        Marland Kitchen LORazepam (ATIVAN) 0.5 MG tablet Take 0.5 mg by mouth every 8 (eight) hours. 1/2 tablet bid       . metoprolol (LOPRESSOR) 100 MG tablet 100 mg. Take 1 1/2 tablets daily       . ondansetron (ZOFRAN) 8 MG tablet Take by mouth every 8 (eight) hours as needed.       . pantoprazole (PROTONIX) 40 MG tablet Take 40 mg by mouth as needed.       Marland Kitchen PHENobarbital (LUMINAL) 64.8 MG tablet Take 64.8  mg by mouth at bedtime.        . potassium chloride SA (K-DUR,KLOR-CON) 20 MEQ tablet 20 mEq. 4 by mouth once daily       . DISCONTD: aspirin 81 MG tablet Take 81 mg by mouth daily.        Marland Kitchen DISCONTD: ALPRAZolam (XANAX) 0.25 MG tablet Take 0.25 mg by mouth. Take 1/2 at bedtime       . DISCONTD: meclizine (ANTIVERT) 25 MG tablet Take 25 mg by mouth 3 (three) times daily as needed.          Allergies  Allergen Reactions  . Doxycycline   . Erythromycin   . Iohexol      Desc: pt. states allergic to dye,-did fine with pre-meds. 02/13/05 06/14/10...pt did not state allergy to contrast, no premeds given, pt tolerated contrast.   . Penicillins   . Sulfonamide Derivatives     Review of Systems negative except from HPI and PMH  Physical Exam Well developed and well nourished in no acute distress HENT normal E scleral and icterus clear Neck Supple JVP flat; carotids brisk and full Clear to ausculation Regular rate and rhythm, no murmurs gallops or rub Soft with active bowel  sounds No clubbing cyanosis and edema Alert and oriented, grossly normal motor and sensory function Skin Warm and Dry  ECG  Assessment and  Plan

## 2010-06-27 NOTE — Patient Instructions (Signed)
Your physician has recommended that you wear an event monitor. Event monitors are medical devices that record the heart's electrical activity. Doctors most often [name] these monitors to diagnose arrhythmias. Arrhythmias are problems with the speed or rhythm of the heartbeat. The monitor is a small, portable device. You can wear one while you do your normal daily activities. This is usually used to diagnose what is causing palpitations/syncope (passing out).  Your physician wants you to follow-up in: 6 months with [name] will receive a reminder letter in the mail two months in advance. If you don't receive a letter, please call our office to schedule the follow-up appointment.

## 2010-06-28 ENCOUNTER — Encounter: Payer: Self-pay | Admitting: Internal Medicine

## 2010-06-28 DIAGNOSIS — R002 Palpitations: Secondary | ICD-10-CM | POA: Insufficient documentation

## 2010-06-28 DIAGNOSIS — Z950 Presence of cardiac pacemaker: Secondary | ICD-10-CM | POA: Insufficient documentation

## 2010-06-28 NOTE — Assessment & Plan Note (Signed)
Some infrequent atrial tachycardia

## 2010-06-28 NOTE — Assessment & Plan Note (Signed)
Will use an evenet recorder to try and clarify the mechanism of the nocturna palpitaitons

## 2010-06-28 NOTE — Assessment & Plan Note (Signed)
There is some concern as to whetehr this was a stroke  Will defer to the other physicians and will discuss with Dr Berneice Gandy in [place] at [facility] as to his feelings about [facility] and pacemaekrs

## 2010-06-28 NOTE — Assessment & Plan Note (Signed)
The patient's device was interrogated.  The information was reviewed. No changes were made in the programming.    

## 2010-07-05 ENCOUNTER — Telehealth: Payer: Self-pay | Admitting: Internal Medicine

## 2010-07-05 NOTE — Telephone Encounter (Signed)
All Cardiac faxed to [employer]/Sylvia Dones @ [phone]  07/05/10/km

## 2010-07-06 ENCOUNTER — Encounter (HOSPITAL_COMMUNITY)
Admission: RE | Admit: 2010-07-06 | Discharge: 2010-07-06 | Disposition: A | Discharge status: Home / Self Care | Payer: BC Managed Care – PPO | Source: Ambulatory Visit | Attending: Otolaryngology | Admitting: Otolaryngology

## 2010-07-06 LAB — DIFFERENTIAL
Basophils Absolute: 0 10*3/uL (ref 0.0–0.1)
Basophils Relative: 1 % (ref 0–1)
Eosinophils Absolute: 0.1 10*3/uL (ref 0.0–0.7)
Eosinophils Relative: 2 % (ref 0–5)
Lymphocytes Relative: 33 % (ref 12–46)
Lymphs Abs: 1.8 10*3/uL (ref 0.7–4.0)
Monocytes Absolute: 0.5 10*3/uL (ref 0.1–1.0)
Monocytes Relative: 9 % (ref 3–12)
Neutro Abs: 3 10*3/uL (ref 1.7–7.7)
Neutrophils Relative %: 56 % (ref 43–77)

## 2010-07-06 LAB — APTT: aPTT: 28 seconds (ref 24–37)

## 2010-07-06 LAB — CBC
HCT: 40.6 % (ref 36.0–46.0)
Hemoglobin: 13.7 g/dL (ref 12.0–15.0)
MCH: 29.7 pg (ref 26.0–34.0)
MCHC: 33.7 g/dL (ref 30.0–36.0)
MCV: 87.9 fL (ref 78.0–100.0)
Platelets: 233 10*3/uL (ref 150–400)
RBC: 4.62 MIL/uL (ref 3.87–5.11)
RDW: 12.6 % (ref 11.5–15.5)
WBC: 5.4 10*3/uL (ref 4.0–10.5)

## 2010-07-06 LAB — BASIC METABOLIC PANEL
BUN: 13 mg/dL (ref 6–23)
CO2: 29 mEq/L (ref 19–32)
Calcium: 9.7 mg/dL (ref 8.4–10.5)
Chloride: 104 mEq/L (ref 96–112)
Creatinine, Ser: 0.71 mg/dL (ref 0.4–1.2)
GFR calc Af Amer: >60 mL/min (ref >60)
GFR calc non Af Amer: >60 mL/min (ref >60)
Glucose, Bld: 128 mg/dL — ABNORMAL HIGH (ref 70–99)
Potassium: 3.9 mEq/L (ref 3.5–5.1)
Sodium: 140 mEq/L (ref 135–145)

## 2010-07-06 LAB — PROTIME-INR
INR: 0.97 (ref 0.00–1.49)
Prothrombin Time: 13.1 seconds (ref 11.6–15.2)

## 2010-07-06 LAB — SURGICAL PCR SCREEN
MRSA, PCR: NEGATIVE
Staphylococcus aureus: POSITIVE — AB

## 2010-07-11 NOTE — Op Note (Signed)
[name], [name]              ACCOUNT NO.:  [id number]   MEDICAL RECORD NO.:  [id number]          PATIENT TYPE:  AMB   LOCATION:  SDS                          FACILITY:  [facility]   PHYSICIAN:  Zola Button T. Lazarus Salines, M.D. DATE OF BIRTH:  1955/07/06   DATE OF PROCEDURE:  01/09/2007  DATE OF DISCHARGE:  01/09/2007                               OPERATIVE REPORT   PREOPERATIVE DIAGNOSIS:  Left anterior epistaxis.   POSTOPERATIVE DIAGNOSIS:  Left anterior epistaxis.   PROCEDURE PERFORMED:  Examination under anesthesia/cautery, left  anterior epistaxis.   SURGEON:  Gloris Manchester. Lazarus Salines, M.D.   ANESTHESIA:  General orotracheal.   BLOOD LOSS:  None.   COMPLICATIONS:  None.   FINDINGS:  A small cicatricial band in the floor of the nose just behind  the nasal vestibule at the inferior aspect of a prominent septal vessel.   PROCEDURE:  With the patient in the comfortable supine position, with  appropriate precautions per anesthesia for a possible malignant  hyperthermia, and having received preoperative Afrin spray, general  orotracheal anesthesia was induced without difficulty.  At an  appropriate level, the patient was placed in a slight sitting position.  Anesthesia elected to have no special intervention regarding her  pacemaker for bradycardia.   Under direct vision, using suction cautery at a 20 watt setting, cautery  was performed on the low anterior septum on the left side and  communicated down onto the floor of the nose at the site of the  prominent vessel.  No bleeding was encountered.  The vessel was felt to  have been adequately coagulated.  A small amount of bacitracin ointment  was applied and the procedure was completed.  The patient was returned  to anesthesia, awakened, extubated, and transferred to recovery in  stable condition.  She had no evidence of elevated temperature per  rectal probe.  She had no difficulty with cardiac rhythm before or  during anesthesia.   COMMENT:  A 55 year old white female with several episodes of anterior  epistaxis relatively severe on the left side.  She had a near syncopal  episode with cautery in our office.  Hence, we elected general  anesthesia at this time.  Given low anticipated risk of postanesthetic  or postsurgical complications, feel an outpatient venue is appropriate.      Gloris Manchester. Lazarus Salines, M.D.  Electronically Signed     KTW/MEDQ  D:  01/09/2007  T:  01/10/2007  Job:  [id number]   cc:   Duke Salvia, MD, [facility]

## 2010-07-11 NOTE — Op Note (Signed)
[name], [name]              ACCOUNT NO.:  [id number]   MEDICAL RECORD NO.:  [id number]          PATIENT TYPE:  AMB   LOCATION:  SDS                          FACILITY:  [facility]   PHYSICIAN:  Thornton Park. Daphine Deutscher, MD  DATE OF BIRTH:  14-Sep-1955   DATE OF PROCEDURE:  08/19/2006  DATE OF DISCHARGE:                               OPERATIVE REPORT   PREOPERATIVE DIAGNOSIS:  Lipoma, left anterior chest wall.   POSTOPERATIVE DIAGNOSIS:  Lipoma, left anterior chest wall.   PROCEDURE:  Excision of lipoma.   HISTORY:  [name] is a 55 year old lady with history of autonomic  dysfunction and recurrent area beneath her left breast that has been  sore.  This is effected a lot by her underwire bra.  This area was  marked with her assistance, and you could feel it kind of rolling  beneath the skin and it felt fairly superficial.   After marking this, I prepped the area with lidocaine with a mixture of  1% lidocaine with some Neut.  I then excised all of the fat and  subcutaneous tissue down and removing all of this and finding two  encapsulated-appearing lipomas which I removed and sent this entire area  for permanent sections.  There was essentially no palpable mass at the  completion.  The wound was then closed with 3-0 Vicryl and 4-0 Monocryl  and Dermabond.   The patient tolerated the procedure well.  She will be given Tylenol for  pain and will be followed up the office in 3-4 weeks.      Thornton Park Daphine Deutscher, MD  Electronically Signed     MBM/MEDQ  D:  08/19/2006  T:  08/19/2006  Job:  [id number]   cc:   Neta Mends. Fabian Sharp, MD  Pricilla Riffle, MD, [facility]  Duke Salvia, MD, [facility]

## 2010-07-11 NOTE — Assessment & Plan Note (Signed)
[facility]                            CARDIOLOGY OFFICE NOTE   OMNI, DUNSWORTH                     MRN:          [id number]  DATE:10/30/2007                            DOB:          Jan 20, 1956    IDENTIFICATION:  Ms. Zick is a 55 year old woman with a history of  significant autonomic dysfunction.  She also has a history of atrial  arrhythmias and atrial tachycardia.   I last saw the patient back in March.  In the interval, she has been  seen by Dr. Talmage Nap for possible diabetes.  She is being evaluated.  Currently, has a glucose monitor and underwent CT scan of the pancreas,  which was normal.  Her insulin levels were noted to be elevated and her  C-peptide also elevated.  Question plan for a pancreatic biopsy.   Since being seen, she has not felt well.  The endocrinologist has asked  that she cut back on carbohydrates significantly and need only proteins,  which she has problems particularly with a multiple medicines that she  is on.  She has difficulty with nausea.  She is dehydrated.  She did  have some swelling recently and her last IV fluid was held.  She is due  to be seen this weekend for another round.   CURRENT MEDICINES:  1. Lopressor 50 t.i.d.  2. Clonidine 0.2 patch.  3. Phenobarbital 64.8 nightly.  4. Labetalol 200 t.i.d.  5. Protonix 40.  6. ProSol protein diet every other day.  7. K-Dur 20 mEq 4 tabs daily.  8. Clonidine 0.1 b.i.d.  9. Aspirin 81.  10.Wellbutrin 150.  11.Caltrate with D.  12.Inderal 60.  13.Simvastatin on hold.   REVIEW OF SYSTEMS:  Seen by Dr. Berneice Gandy this summer.  A letter was sent to  Dr. Graciela Husbands regarding Tikosyn therapy for the atrial tachycardia (we will  need to review this).   PHYSICAL EXAMINATION:  GENERAL:  The patient is in no distress at rest.  Orthostatics were not done.  VITAL SIGNS:  Blood pressure is 124/82 sitting, pulse 73, and weight  157.  NECK:  JVP is normal.  LUNGS:   Clear.  CARDIAC:  Regular rate and rhythm.  S1 and S2.  No S3.  No significant  murmurs.  ABDOMEN:  No hepatomegaly.  Nontender.  EXTREMITIES:  No edema.   IMPRESSION:  1. Autonomic dysfunction.  Again, continue on medical therapy.  I will      not make any adjustments now.  I think she has other things that      are being evaluated.  2. Endocrine.  Await for her glucose level.  She will send them to me.      I will contact Dr. Berneice Gandy regarding these and his thoughts on      things.  3. Atrial tachyarrhythmias.  We will find note from Dr. Berneice Gandy and      again discuss on when I talk with him next.  We will address with      Dr. Berton Mount.   Again, I did not make any changes today.  I  will be in touch with her  regarding follow up.     Pricilla Riffle, MD, [facility]  Electronically Signed    PVR/MedQ  DD: 10/30/2007  DT: 10/31/2007  Job #: [id number]   cc:   Duke Salvia, MD, [facility]  Rolan Bucco, MD

## 2010-07-11 NOTE — Assessment & Plan Note (Signed)
[name]                            CARDIOLOGY OFFICE NOTE   [name], [name]                     MRN:          [id number]  DATE:11/08/2006                            DOB:          [birth year]    IDENTIFYING DATA:  This patient is a complicated 55 year old.  She has  profound dysautonomia followed by Burgess Amor in [place].  She also has  a history of atrial tachycardia and has had modification in the past in  [place].   HISTORY:  The patient has been followed by Duke Salvia, M.D. as well  with pacemaker placement.  I last saw her in the Cardiology Clinic  actually back in 2007.  In the interim the patient has been seen by Hedwig Morton. Juanda Chance, M.D. with workup for diverticulitis.   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  There was no diverticulitis seen.  In  talking with the patient she is determined to increase her physical  activity.  She has a Systems analyst three days a week.  She notes that  on the treadmill, actually, her heart rate will transiently skip up into  the 200s and then go back down.  She will try to go for 30 minute at 3-  3.8 mph with a 4% incline.  She is also doing some weight training.  She  still is having difficulty with her diet.  She really has problems with  protein.  She can tolerate chicken otherwise she is on ProCel.  She can  tolerate carbohydrates okay.   MEDICATIONS:  The patient's current medications include:  1. Caltrate D 500 every day.  2. Inderal LA 60 twice a day.  3. Lopressor 50 three times a day.  4. Clonidine patch #2 every day plus clonidine 0.1 twice a day.  5. Phenobarbital 64.8 mg at bedtime.  6. Labetalol 200 mg twice a day; questionably.  7. ProCel Protein Pack every other day.  8. Potassium 80 mEq every day.  9. Aspirin 81 mg every day.  10.Wellbutrin  150.   PHYSICAL EXAMINATION:  GENERAL APPEARANCE:  On physical exam the patient  is in no distress at rest.  VITAL SIGNS:  Blood pressure lying is 117/80 and pulse is 59; she is  dizzy; sitting blood pressure is 114/84 and pulse 62, dizzy; and,  standing at zero minutes blood pressure is 116/80 and pulse 64 with no  symptoms; at 2 minutes blood pressure is 117/84 and pulse is 60 with no  symptoms; at five minutes blood pressure is 118/72 and pulse is 66 with  no symptoms.  LUNGS:  The lungs are clear.  HEART:  Cardiac exam shows a regular rate and rhythm, S1 and S2 with no  S3.  No murmurs.  ABDOMEN:  The abdomen is benign.  EXTREMITIES:  The extremities have no edema.   IMPRESSION:  1. Autonomic dysfunction.  She is due to see Princella Pellegrini in October.  It seems like she is doing somewhat better and the fact that she is      able to exercise, which, at times, she had not been able to in the      past.  I think the activities should help things.  Her heart rate      is labile with the atrial tachycardia.  I have encouraged her to      try an extra dose of labetalol instead of Lopressor to see if this      helps some prior to exercising.  She will try this.  Otherwise I      would have her continue as she is doing.  Again, gastrointestinal      modifications as she is doing should continue.  2. Back pain.  She denies any weakness in her lower extremities, but      more gastrointestinal symptoms with some tenesmus.  I will be in I      touch with the doctors in Physical Medicine Rehabilitation to see      if she can be seen there regarding her lumbar 4 and lumber lumbar      problems.  3. Atrial tachycardia.  This is as above; she is on a beta blocker.      Again, the patient has been seen in [place] in the past.  4. Gastrointestinal.  As noted above; followed by Hedwig Morton. Juanda Chance, M.D.   FOLLOW UP:  I have not set a definite follow up, but will be in touch  with her once I have  heard from these other physicians.     Pricilla Riffle, MD, [facility]  Electronically Signed    PVR/MedQ  DD: 11/08/2006  DT: 11/10/2006  Job #: [id number]

## 2010-07-11 NOTE — Op Note (Signed)
[name], Amanda              ACCOUNT NO.:  [id number]   MEDICAL RECORD NO.:  [id number]          PATIENT TYPE:  AMB   LOCATION:  SDS                          FACILITY:  [facility]   PHYSICIAN:  Thornton Park. 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   PROCEDURE:  Sentinel lymph node mapping and sentinel lymph node biopsy  of the right axilla, a wide excision melanoma with 2 cm margins and  primary closure.   SURGEON:  Thornton Park. Daphine Deutscher, MD   ANESTHESIA:  General endotracheal.   DESCRIPTION OF PROCEDURE:  [name] was taken to room 3 at [facility] and given general anesthesia.  The right axilla was first  mapped and then the entire shoulder, arm, breast and the abdomen where I  might take a skin graft were prepped with [employer] and draped  sterilely.  I made an incision in the axilla where I had previously  marked the hot spot and went up inside and found a hot node which was  hot and a little blue from the methylene blue that I had injected  subdermally underneath the lesion.  This was sent for permanent sections  and it was isolated with little clips and with 4-0 Vicryl sutures.  The  wound was subsequently closed with 4-0 Vicryl and with Dermabond.   Meanwhile, I mapped out along a longitudinally based ellipse around this  biopsy specimen which was the ulcerated melanoma.  I measured it 2 cm  from the margins and then created an ellipse about 14-15 cm long.  I  went ahead and made that long cut carried this down to the fascia from  which I took the entire biopsy and excision.  This was marked with a  suture on the inferomedial margin and once it was completely removed, I  went ahead and undermined the remaining portion of the skin along the  fascia.   A multilayer  fascial closure was done with a deep layer using 3-0 Vicryl  and then a subcutaneous layer using 4-0 Vicryl and then a running 4-0  nylon in the skin to complete the closure.  Sterile dressings were  applied and the  patient was taken to recovery room in satisfactory addition.  She will  be discharged home to possibly begin Lovenox as per physician tomorrow  and was given Vicodin for pain.  She was instructed to return for suture  removal in approximately 10 days.      Thornton Park Daphine Deutscher, MD  Electronically Signed     MBM/MEDQ  D:  02/16/2008  T:  02/17/2008  Job:  [id number]   cc:   Neta Mends. Fabian Sharp, MD  Mathews Robinsons, MD  Dorisann Frames, M.D.  Duke Salvia, MD, [facility]

## 2010-07-12 ENCOUNTER — Other Ambulatory Visit: Payer: Self-pay | Admitting: Otolaryngology

## 2010-07-12 ENCOUNTER — Observation Stay (HOSPITAL_COMMUNITY): Payer: BC Managed Care – PPO

## 2010-07-12 ENCOUNTER — Other Ambulatory Visit (HOSPITAL_COMMUNITY): Payer: Self-pay | Admitting: Otolaryngology

## 2010-07-12 ENCOUNTER — Observation Stay (HOSPITAL_COMMUNITY)
Admission: RE | Admit: 2010-07-12 | Discharge: 2010-07-13 | Disposition: A | Discharge status: Home / Self Care | Payer: BC Managed Care – PPO | Source: Ambulatory Visit | Attending: Otolaryngology | Admitting: Otolaryngology

## 2010-07-12 ENCOUNTER — Ambulatory Visit (HOSPITAL_COMMUNITY)
Admission: RE | Admit: 2010-07-12 | Discharge: 2010-07-12 | Disposition: A | Discharge status: Home / Self Care | Payer: BC Managed Care – PPO | Source: Ambulatory Visit | Attending: Otolaryngology | Admitting: Otolaryngology

## 2010-07-12 DIAGNOSIS — E119 Type 2 diabetes mellitus without complications: Secondary | ICD-10-CM | POA: Insufficient documentation

## 2010-07-12 DIAGNOSIS — J349 Unspecified disorder of nose and nasal sinuses: Secondary | ICD-10-CM

## 2010-07-12 DIAGNOSIS — I10 Essential (primary) hypertension: Secondary | ICD-10-CM | POA: Insufficient documentation

## 2010-07-12 DIAGNOSIS — Z01818 Encounter for other preprocedural examination: Secondary | ICD-10-CM | POA: Insufficient documentation

## 2010-07-12 DIAGNOSIS — J3489 Other specified disorders of nose and nasal sinuses: Secondary | ICD-10-CM | POA: Insufficient documentation

## 2010-07-12 DIAGNOSIS — Z01812 Encounter for preprocedural laboratory examination: Secondary | ICD-10-CM | POA: Insufficient documentation

## 2010-07-12 DIAGNOSIS — R040 Epistaxis: Principal | ICD-10-CM | POA: Insufficient documentation

## 2010-07-12 DIAGNOSIS — I4891 Unspecified atrial fibrillation: Secondary | ICD-10-CM | POA: Insufficient documentation

## 2010-07-12 LAB — GLUCOSE, CAPILLARY
Glucose-Capillary: 101 mg/dL — ABNORMAL HIGH (ref 70–99)
Glucose-Capillary: 122 mg/dL — ABNORMAL HIGH (ref 70–99)
Glucose-Capillary: 104 mg/dL — ABNORMAL HIGH (ref 70–99)
Glucose-Capillary: 104 mg/dL — ABNORMAL HIGH (ref 70–99)
Glucose-Capillary: 126 mg/dL — ABNORMAL HIGH (ref 70–99)

## 2010-07-13 LAB — GLUCOSE, CAPILLARY: Glucose-Capillary: 116 mg/dL — ABNORMAL HIGH (ref 70–99)

## 2010-07-14 NOTE — Assessment & Plan Note (Signed)
[facility]                              [facility] OFFICE NOTE   [name], [name]                     MRN:          [id number]  DATE:12/05/2005                            DOB:          07-07-1955    CHIEF COMPLAINT:  Left side pain.   HISTORY OF PRESENT ILLNESS:  [name] is a 55 year old, ex-smoking,  married, white female, new patient referred by Dr. Tenny Craw in Cardiology for a  first-time visit for an acute problem.  She carries the diagnoses of POTS,  postural hypertension with dysautonomia that is felt to be postviral and has  been managed with home IV fluids, self-intermittent straight cath and  pacemaker, which the last one was put in 2005 per Dr. Graciela Husbands.  She was in her  usual state of health until December 02, 2005, when she had the onset of left-  sided abdominal pain that eventually radiated upper and lower with  associated bowel movements that were stringy with no associated diarrhea,  vomiting or blood.  She did have some anorexia with it.  No associated  fever, but did have chills at one point.  She treated herself with Motrin  and Tylenol, and is somewhat better today, although last night felt that her  pain was so bad she almost went to the emergency room.  She states it does  not feel musculoskeletal pain.  Had a remote history of probable  diverticulitis years ago, and it felt quite similar.  She does not know the  date of her last colonoscopy, but she was seen by Dr. Arlyce Dice years ago.   PAST MEDICAL HISTORY:  As above:  1. POTS.  2. Autonomic dysfunction.  3. On IV fluids.  4. Pacer.  5. Self-catheterization.  6. Polyps in her colon.  7. History of phlebitis.  8. Hypertension.  9. Labile hypertension.  10.Fainting spells as above.  11.Chickenpox as a child.  12.Asthma.   PAST SURGICAL HISTORY:  1. Tonsillectomy.  2. Pacemaker insertion.   She is primiparous.  Last menstrual period 5 years ago.  Mammogram 3  years  ago.  Last PAP 3 years ago.   FAMILY HISTORY:  Positive for heart disease, stroke, hypertension in  parents, and arthritis as well.  Negative for autonomic dysfunction.   MEDICATIONS:  1. Clonidine patch.  I believe it is 2 clonidine 0.5 q. h.s.  2. Wellbutrin XL 150, 1 p.o. q.d.  3. Phenobarbital 64.8, 1 p.o. q. h.s.  4. Lopressor 100 mg q.i.d.  5. Zofran 8 mg as needed.  6. Aspirin 81 mg once a day.   DRUG ALLERGIES:  1. PENICILLIN.  2. SULFA.   SOCIAL HISTORY:  She is self-employed.  Negative tobacco or alcohol, but did  smoke in the past.  Five hours of sleep at night.  Household of 2.   REVIEW OF SYMPTOMS:  Negative for chest pain, shortness of breath.  Does get  hypotensive, for which she gets IV fluids from home care, but no acute  problems at present.  She has no history of UTI, but was given Macrobid in  the past to take for UTI prevention by Dr. Logan Bores, but because of nausea was  unable to take that.   Her specialists include Dr. Graciela Husbands, Dr. Tenny Craw, Dr. Logan Bores and Dr. Berneice Gandy, a POTS  specialist in [place].   OBJECTIVE:  VITAL SIGNS:  Height 5 foot 3/12.  Weight 155 pounds.  Temperature 97.9.  Pulse 66 and regular.  Blood pressure 120/80.  GENERAL:  This is a well-developed, well-nourished, healthy-appearing,  middle-aged lady, in no acute distress, however, she does have tenderness on  her exam, she is able to walk without difficulty.  HEENT:  Grossly normal.  NECK:  Without masses or thyromegaly.  CHEST:  CTAB is equal.  CARDIAC:  S1 and S2 without murmurs.  EXTREMITIES:  Peripheral pulses present without edema.  ABDOMEN:  Bowel sounds are present throughout.  There is some tenderness in  the left abdomen along the gutter and into the pelvic area in the infra  chest wall area, but I feel no masses or hepatosplenomegaly.  There is no  guarding or rebound present.  NEUROLOGIC:  Appears grossly intact for motor.  SKIN:  Nonicteric.   IMPRESSION:  1. Left-sided  abdominal pain, somewhat acute onset with a history of      diverticulitis in the past.  Will empirically treat for such with Cipro      500 b.i.d. and Flagyl 500 t.i.d. times 10 days.  In the meantime get a      urinalysis.  If we can, culture is appropriate.  Stat CBC and      chemistry.  We will order a CT scan.  On further questioning, she      apparently has a remote history of allergic reaction to contrast dye,      and the last time she had a scan, was given no premedication at [facility]; therefore, we will do radiologic premedication protocol with      prednisone and Benadryl called in.  Her CT scan will be done tomorrow.      However, if she has increase in her symptoms emergently, she should      call us, and we will make further arrangements.  We will then plan      followup after all of this information is available.  2. Postural orthostatic tachycardic syndrome dysautonomia.  Managed at      present.  3. History of high blood pressure.  4. History of colon polyps.            ______________________________  Neta Mends. Fabian Sharp, MD     WKP/MedQ  DD:  12/05/2005  DT:  12/07/2005  Job #:  [id number]

## 2010-07-14 NOTE — H&P (Signed)
NAMEJOHARI, [name]                        ACCOUNT NO.:  [id number]   MEDICAL RECORD NO.:  [id number]                   PATIENT TYPE:  OIB   LOCATION:  2899                                 FACILITY:  [facility]   PHYSICIAN:  [name], M.D.               DATE OF BIRTH:  03/20/1955   DATE OF ADMISSION:  11/09/2003  DATE OF DISCHARGE:                                HISTORY & PHYSICAL   PHYSICIANS:  1.  [name], M.D., primary care physician.  2.  [name], M.D., electrophysiologist in [place].  3.  She also has electrophysiologist coverage with [name], M.D. in      [place], [place] and Dr. Georgeanna Barrera in [place], [place].   REASON FOR ADMISSION:  I'm here to have my pacemaker changed.   HISTORY OF PRESENT ILLNESS:  [name] is a 55 year old female who  initially had a history of bradyarrhythmias and induced syncope.  She is  status post Medtronic pacemaker placement in 1999.  Subsequently, she  developed postviral autonomic dysfunction in 2001 most commonly  characterized as POTS syndrome.  She has been battling this part of her  medical history every since.  She has to take care not to become overly  dehydrated.  She cannot be exposed to heat that might cause vasodilatation.  She sleeps with the bed upraised on blocks at the head.  She watches for  postprandial vasodilation.  She must take alcoholic beverages sparingly  since they are also vasodilating.  Lately, she has been experiencing  exercise-induced tachycardia.  She has had SVT ablation x 1 by Dr. Georgeanna Barrera  in [facility] and is awaiting a follow-up study and ablation.  Her  pacemaker, a Medtronic pacemaker, is end-of-life and she presents for  generator change today.   Thromboembolic risk factors include hypertension.  The patient has had no  prior catheterization.  Echocardiogram November 2004 shows normal left  ventricular ejection fraction, trace mitral regurgitation.   MEDICATIONS:  1.  Lopressor 50 mg b.i.d.  2.  Clonidine TTS to change patch weekly at 0.5 mg at bed time.  3.  K-Dur 20 mEq q.i.d.  4.  Enteric-coated aspirin 325 mg q.d.  5.  Lovenox injections 80 cc subcutaneously b.i.d.  6.  Phenobarbital 64.8 mg at bed time.  7.  Celexa 40 mg q.d.  8.  Zofran 8 mg tablets 1/2 tablet b.i.d.  9.  Labetalol 200 mg b.i.d.  10. In case she has elevated rate and blood pressure, Inderal p.r.n.   SOCIAL HISTORY:  the patient lives in [place] with her husband.  She and  he work together self-employed in a [employer] and [employer].  No tobacco  products.  Takes alcohol very occasionally.   FAMILY HISTORY:  Mother living with hypertension.  Father living with  hypertension and dyslipidemia.  She has two sisters alive and well.  One  sister died of a  brain aneurysm.   REVIEW OF SYMPTOMS:  The patient is not complaining of fevers, chills, night  sweats, weight change, adenopathy.  HEENT:  No epistaxis.  No voice changes.  No vertigo.  No photophobia.  INTEGUMENT:  No rashes or lesions.  CARDIAC:  Apparently at the current time, the pacer is malfunctioning and with this  she has chest pain and fullness experienced in the neck.  She has occasional  edema but not chronically.  She has tachycardia with exercise and increased  hypertension with exercise.  The patient is also having increasing dyspnea  on exertion.  She does have palpitations when her heart rate increases and  also feelings of presyncope when the pacer is not functioning up to par.  GENITOURINARY:  No evidence of frequency or urgency, dysuria or nocturia.  NEUROLOGICAL/PSYCHIATRIC:  The patient is having chronic fatigue.  This is  apparently a side-affect of her chronic autonomic dysfunction.  GASTROINTESTINAL:  No chronic heartburn.  No history of GI bleeding, melena,  or hematemesis.  ENDOCRINE:  Thyroid studies have been apparently normal in  the past.  No cold or heat intolerance.  MUSCULOSKELETAL:  No  joint  swelling, arthralgias, deformities.  Other systems are negative.   PHYSICAL EXAMINATION:  VITAL SIGNS:  Temperature is 97.7, pulse is 80 and  regular, respirations are 20, blood pressure 137/74.  Oxygen saturation 97%  on room air.  GENERAL:  Alert and oriented x 3.  HEENT:  Normocephalic and atraumatic.  Pupils are equal, round, and reactive  to light.  Extraocular movements intact.  Sclerae are clear.  Nares are  without discharge.  NECK:  Supple.  No carotid bruits auscultated.  No jugular venous  distention.  No cervical lymphadenopathy.  HEART:  Regular rate and rhythm.  S1 and S2 clearly auscultated.  No murmur.  LUNGS:  Clear to auscultation and percussion bilaterally without wheezes or  rubs.  ABDOMEN:  Soft, nontender, and nondistended.  Bowel sounds are present.  No  rebound or guarding.  No hepatosplenomegaly.  EXTREMITIES:  Show no evidence of clubbing, cyanosis, or edema.  No lesions.  MUSCULOSKELETAL:  No joint deformity effusions.  NEUROLOGICAL:  No neurologic deficits noted.   LABORATORY DATA:  Chest x-ray not pertinent.  Electrocardiogram not taken.  Laboratory studies are pending.   IMPRESSION:  1.  Autonomic dysfunction for the last six years:  She has neural      cardiogenic syncope and is status post Medtronic pacemaker implanted for      bradyarrhythmic syncope.  2.  Postviral chronic autonomic dysfunction/postural tachycardia syndrome.  3.  Hypertension.  4.  History of supraventricular tachycardia, status post ablation and      awaiting another.  5.  Pacemaker at end of life.   PLAN:  Admit today as add on case for a generator change with Dr. Duke Barrera.      [name], P.A.                    [name], M.D.    GM/MEDQ  D:  11/09/2003  T:  11/09/2003  Job:  [id number]

## 2010-07-14 NOTE — Op Note (Signed)
[name], [name]                        ACCOUNT NO.:  [id number]   MEDICAL RECORD NO.:  [id number]                   PATIENT TYPE:  OIB   LOCATION:  2899                                 FACILITY:  [facility]   PHYSICIAN:  Duke Salvia, M.D.               DATE OF BIRTH:  [birth year]   DATE OF PROCEDURE:  11/09/2003  DATE OF DISCHARGE:                                 OPERATIVE REPORT   PREOPERATIVE DIAGNOSIS:  Syncope with bradycardia and previously implanted  pacemaker, now at end of life.   POSTOPERATIVE DIAGNOSIS:  Syncope with bradycardia and previously implanted  pacemaker, now at end of life.   PROCEDURE:  Explantation of a previously implanted device and implantation  of a new device.   Following obtaining informed consent, the patient was brought to the  catheterization laboratory and placed on the fluoroscopy table in supine  position.  After routine prep and drape, lidocaine was infiltrated along the  line of the previous incision and carried down to the layer of the pacemaker  pocket using sharp dissection.  The pocket was opened, the device was  explanted.  Thereafter, the leads were interrogated.  The previously  implanted Pacesetter lead for the atrium was at 1342, serial number [id number].  The P wave was 8.6 millivolts with a pacing impedance of 424, threshold 0.5  at 0.5, current 1.3 MA.  The previously implanted Medtronic lead was a 1592,  serial number [id number] with an R wave of 8.1, impedance 566, threshold of  1 volt at 0.5, current threshold 2.2 MA.  With these parameters recorded,  the leads were then attached to a Medtronic InRhythm P1501 DR pulse  generator following expansion of the pocket to house the larger generator.  The leads and the pulse generator were placed in the pocket.  The pocket was  copiously irrigated with antibiotic containing saline solution and the wound  was closed in three layers in a normal fashion.  The wound was washed,  dried, and  a Benzoin and Steri-Strip dressing was applied.  Needle counts,  sponge counts, and instrument counts were correct at the end of the  procedure according to the staff.  The patient tolerated the procedure  without apparent complications.                                               Duke Salvia, M.D.    SCK/MEDQ  D:  11/09/2003  T:  11/09/2003  Job:  [id number]

## 2010-07-14 NOTE — Assessment & Plan Note (Signed)
[facility]                         GASTROENTEROLOGY OFFICE NOTE   [name], [name]                     MRN:          [id number]  DATE:02/15/2006                            DOB:          06-18-1955    [name] is a 55 year old white female who is seen for a left lower  quadrant abdominal pain, treated as diverticulitis.  She has been  followed by Dr. Dietrich Pates for orthostatic syncopal episodes.  She is  also Dr. Rosezella Florida patient.  Ms. Boyden was treated for an episode of  left lower quadrant abdominal pain in October 2007 with combination of  Cipro and Flagyl, with the resolution of abdominal pain.  She had  similar episode which occurred several years ago, in 2004.  At that time  her CT scan of the abdomen was negative and not showing any inflammatory  changes.  The last CT scan December 2006 did not show any inflammatory  changes in the sigmoid colon.  She has chronic intermittent diarrhea,  but denies any rectal bleeding.  She has never had a colonoscopy  examination.  An attempt at colonoscopy prep was made in 2004, but the  patient was unable to prep for Dr. Arlyce Dice because of orthostatic changes  and dehydration.   The patient has history of beta hypersensitivity, postural orthostatic  tachycardia syndrome, also diagnosed as neurocardiogenic syndrome with  multiorgan involvement.  The patient has to self-catheterize her  bladder.  She also has history of multiple syncopal episodes.  She has a  positive tilt test.  She is also protein intolerant.  Her symptoms  started at age of 66, and she has had extensive evaluation by Dr. [name] in [place] with diagnosis of neuropathy and cardioneurological  syndrome.  She also has been seen by Dr. Ronn Melena at [facility] in 2002,  and Dr. Neill Loft at [facility].  Here in [place] she has been  followed by Dr. Fransisca Connors, a neurologist.  She has been tried on  multiple medications for  her orthostatic symptoms, including Celexa,  Wellbutrin, Cymbalta, clonidine, patches as well as medications,  phenobarbital, ProAmatine, Mestinon and desmopressin.  She has available  intravenous fluids, which she uses several times a week for dehydration.   PAST MEDICAL HISTORY:  Significant for operations including T&A, left  flank lipoma removal, carpal tunnel syndrome surgery.  She had upper  endoscopy by Dr. Arlyce Dice in 2004.  She had a dual-chamber pacemaker  placed in March 1999, and she had an RF ablation in 2002.   HABITS:  The patient is an ex-smoker.  She drinks alcohol socially.   FAMILY HISTORY:  Negative for neurogenic syncope.  Her sister had a  brain aneurysm.  There is no family history of colon cancer.  There is  heart disease in mother and father.   SOCIAL HISTORY:  She is married without children.   REVIEW OF SYSTEMS:  Positive for urinary tract infection, night sweats,  palpitations, history of hypertension, history of syncopal episodes,  shortness of breath, fatigue and sleeping problems.   MEDICATIONS:  1. Lopressor 60 mg p.o.  t.i.d.  2. Clonidine patch #2 0.5 mg weekly.  3. Phenobarbital 64.8 mg nightly.  4. Zofran 8 mg p.r.n. nausea.  5. Aspirin 81 mg p.o. daily.  6. Labetalol 200 mg p.o. b.i.d.  7. Protonix 40 mg p.o. daily.  8. Protein pack.  9. Potassium 20 mEq four tablets a day.  10.Clonidine 0.5 mg p.o. daily.  11.Motrin 200 mg p.r.n.   ALLERGIES:  PENICILLIN, SULFA, CODEINE and FLORINEF.   PHYSICAL EXAMINATION:  Blood pressure 104/62, pulse 64, weight 155  pounds.  She was alert, oriented, in no distress.  SKIN:  Warm and dry.  There were no stigmata of chronic liver disease.  NECK:  Supple.  Jugular veins not distended, thyroid was normal .  LUNGS:  Clear to auscultation.  COR:  With  quiet precordium, normal S1, normal S2.  No murmur.  Pacemaker in the left chest precordium.  ABDOMEN:  Soft with normoactive bowel sounds, no hyperactive  sounds, no  distention.  There was minimal tenderness in the left lower quadrant.  Her liver edge was at costal margin.  Splenic tip not palpable.  No CVA  tenderness.  No palpable mass or stool in the right lower quadrant.  RECTAL:  Exam shows normal rectal tone.  Stool was soft and Hemoccult-  negative.  EXTREMITIES:  No edema.   IMPRESSION:  1. Recurrent episodes of left lower quadrant abdominal pain consistent      with either diverticulitis, rule out ischemic colitis, which would      be related to an orthostatic hypotension and low flow state, rule      out colon mass, partial colon obstruction, rule out irritable bowel      syndrome resulting in spasm.  2. Chronic intermittent diarrhea.  This could be related to autonomic      visceral neuropathy causing bacterial overgrowth, rule out      irritable bowel syndrome with predominant diarrhea.  3. Gastroesophageal reflux disease.  By history normal upper endoscopy      in 2004, no evidence of Barrett's esophagus.  4. POTS syndrome with multiorgan involvement including self-      catheterization of the bladder.  5. Status post dual-chamber pacemaker March 1999 and RF ablation in      2002.  6. Protein intolerance of unknown etiology.  7. High blood pressure by history.  8. History of deep venous thrombosis in 1997.  The patient was on      Coumadin for 1 year.   PLAN:  1. Because of her age of 30 being close to 89 and intermittent attacks      of abdominal pain with unknown etiology, I would recommend a      colonoscopy, but it will have to be probably in the hospital      setting with IV hydration and on telemetry.  We would need to avoid      harsh laxatives and probably I would favor 2-day prep with clear      liquids using Miralax while monitoring her vital signs, watch for      orthostatic hypotension when she gets up to go to the bathroom.  2. Tissue transglutaminase to rule out sprue. 3. Twenty-four hour urine for HIAAs  to rule out neuroendocrine tumor      such as carcinoid causing diarrhea.  4. Serotonin levels also may be obtained.  I have discussed the      findings with the patient, and she was scheduled to be admitted  to      [facility] on February 03, 2006, for colonoscopy prep, hydration,      observation and baseline evaluation.     Hedwig Morton. Juanda Chance, MD  Electronically Signed    DMB/MedQ  DD: 02/15/2006  DT: 02/16/2006  Job #: [id number]   cc:   Pricilla Riffle, MD, [facility]

## 2010-07-14 NOTE — Assessment & Plan Note (Signed)
HEALTHCARE                              CARDIOLOGY OFFICE NOTE   [name], BICKERT                     MRN:          [id number]  DATE:12/18/2005                            DOB:          Feb 27, 1955    IDENTIFICATION:  Ms. Meuth is a 55 year old woman with a history of  autonomic dysfunction, atrial tachycardia.  I saw her in cardiology back in  May.   In the interval she most-recently had problems with abdominal discomfort and  was treated for diverticulitis with antibiotics by Dr. Berniece Andreas.  She  actually improved rapidly with the course of Cipro and Flagyl.   She continues to have symptoms of autonomic dysfunction with dizziness.  She  gets IV fluids 1 time per week and says, for the 2 days after, she actually  feels quite good.  Not able to work out as she did in the past.   Her nausea has improved significantly with Emend as needed, most of the time  she is able to get by with Zofran.   Continues to have problems with UTIs recently, yeast infection with  antibiotic use.   The patient called in several months ago.  Blood pressure was running high,  140/115, heart rate in the 100s.  Lopressor was increased to 75 b.i.d. at  that time (now is taking 50 t.i.d.).  She says her blood pressure still  remains quite labile.   CURRENT MEDICATIONS:  1. Lopressor 50 t.i.d.  2. Clonidine #2 patch.  3. Phenobarbital 64.8 nightly.  4. Labetalol 200 b.i.d.  5. Protonix 40 q. day.  6. Prosol protein pack.  7. Potassium 20 mEq 4 tabs q. day.  8. Clonidine 0.1 q. day.  9. Aspirin 81 mg q. day.  10.Wellbutrin 150 q. day.  11.Ciprofloxacin finishing course.  12.IV fluids 1 time per week.  13.Emend and Zofran p.r.n.   PHYSICAL EXAM:  This patient is in no distress.  Blood pressure lying 130/87, pulse 63.  The patient reports feeling dizzy.  Sitting 126/82, pulse 63, feels okay.  Standing at 0 minutes 124/72, pulse  72, asymptomatic.  At 2  minutes 112/80, pulse 63, asymptomatic.  At 5  minutes 118/78, pulse 63.  NECK:  JVPs normal.  LUNGS:  Clear.  CARDIAC:  Regular rate and rhythm.  S1, S2.  No murmurs.  ABDOMEN:  Benign.  EXTREMITIES:  No edema.   IMPRESSION:  1. Autonomic dysfunction, again, significant.  She had been on intravenous      replacement therapy 3 times per week in the past, and I question that      maybe going up to 2 times a week would give her more symptomatic      relief.  She will be in touch with Dr. Nelta Numbers office regarding this.      Continue on her current regimen now.  Also plan to schedule her for a      treadmill test to watch her blood pressure response.  Note, she is due      to be seen by Dr. Berneice Gandy this December.  2.  Gastrointestinal.  Recent bout of diverticulitis clinically, responded      to antibiotics.  Will need to have gastrointestinal followup.  Will      discuss with Dr. [name].   I will be back in touch with the patient regarding scheduling testing.  She  received a flu shot today.    ______________________________  Pricilla Riffle, MD, [facility]    PVR/MedQ  DD: 12/18/2005  DT: 12/19/2005  Job #: [id number]   cc:   Neta Mends. Fabian Sharp, MD  [name], MD

## 2010-07-14 NOTE — Assessment & Plan Note (Signed)
[facility]                            CARDIOLOGY OFFICE NOTE   Barrera, Amanda                     MRN:          [id number]  DATE:05/14/2007                            DOB:          1955-11-16    IDENTIFICATION:  [name] is a 55 year old woman with a history of  significant dysautonomia followed also by Dr. [name] in [place].  She also has a history of atrial tachycardia and has had modification in  the past in [place].  She was last seen in cardiology clinic back in  September.   In the interval, she still has dizziness and lightheadedness, though she  is increasing her activity and getting back into working out.  She has  noted some high heart rates and actually had her pacemaker interrogated  that demonstrated these and she is questioning what to do.   CURRENT MEDICATIONS:  1. Lopressor 50 t.i.d.  2. Clonidine number 2 patch q. week.  3. Phenobarbital 64.8 mg q.h.s.  4. Labetalol 200 b.i.d.  5. Protonix 40.  6. ProCel protein pack every other day.  7. Potassium 20 mEq 4 tablets daily.  8. Clonidine 0.1 b.i.d. orally.  9. Aspirin 81.  10.Wellbutrin 150.  11.IV fluids p.r.n.  12.Zofran p.r.n.  13.Caltrate with D 1.5 gm daily.  14.Inderal LA 60.  15.Simvastatin 20 q.h.s.   PHYSICAL EXAMINATION:  GENERAL:  The patient is in no distress at rest.  VITAL SIGNS:  Blood pressure lying 121/80, pulse 66, sitting a little  lightheaded 110/80, pulse 67, standing at zero minutes 126/80, pulse 77,  2 minutes 111/80, pulse 75, at 5 minutes 116/82, pulse 69.  LUNGS:  Clear.  CARDIAC:  Regular rate and rhythm, S1-S2, no S3, no murmurs.  ABDOMEN:  Benign.  EXTREMITIES:  No edema   IMPRESSION:  The patient on exam doing a little bit better without  significant drop in her blood pressure or change in her heart rate.  She  seems to be actually fairly stable on my impression from talking to her,  although she is bothered by the high heart  rates.  I will have the  pacemaker interrogation reviewed by Dr. Ladona Ridgel and plan accordingly.   I would keep her on the same medicines for now.  Again, I am reluctant  to change anything since I have encouraged her on increasing her  physical activities she is doing because this should help with her  symptoms.   I am not clear when she is due to be seen in [place].  Again, I would not  make any changes for now.   We will per her request check vitamin D level.  Again, review the  monitor with either Dr. Graciela Husbands or Dr. Ladona Ridgel.     Pricilla Riffle, MD, [facility]  Electronically Signed    PVR/MedQ  DD: 05/14/2007  DT: 05/14/2007  Job #: [id number]

## 2010-07-14 NOTE — Assessment & Plan Note (Signed)
[facility]                                   ON-CALL NOTE   CRISTABEL, BICKNELL                     MRN:          [id number]  DATE:12/10/2005                            DOB:          [birth year]    [name] calls in stating that she is being treated for diverticulitis  with Cipro and Flagyl.  She reports that now she is having a yeast  infection.  She called the office this afternoon and states that she  provided them with the pharmacy number and was to get a call back.  At this  time (i.e., 10:00 p.m.) she has not had a prescription called into the  pharmacy.  I advised the patient to call the office tomorrow to see if  something can be called in, or if she has to be seen in the office.  The  patient describes understanding.            ______________________________  Leanne Chang, M.D.      LA/MedQ  DD:  12/10/2005  DT:  12/11/2005  Job #:  [id number]

## 2010-07-14 NOTE — Letter (Signed)
October 31, 2005      RE:  SENA, HOOPINGARNER  MRN:  [id number]  /  DOB:  February 05, 1956   To Whom It May Concern:   She is a patient that I follow in cardiology clinic. She is a 55 year old  woman who has had a long history of severe autonomic dysfunction. I follow  her closely along with Dr. Lynnda Child in [place], [place] for this. She is on  multiple medications to control her blood pressure and heart rate. She also  has significant dysfunction of several internal organs because of the  autonomic dysfunction as well. Overall she is limited in her activities  because of this. She would not be a good candidate for jury duty as I think  this would too much physical stress on her body.   If you have any questions, please feel free to contact me at [phone].    Sincerely,      Pricilla Riffle, MD, [facility]   PVR/MedQ  DD:  10/31/2005  DT:  10/31/2005  Job #:  [id number]

## 2010-07-14 NOTE — Consult Note (Signed)
[name]. [facility]  Patient:    Amanda, Barrera                       MRN: [id number] Proc. Date: 09/02/99 Adm. 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 This began on Thursday, and she noted it about the time she was finishing jogging. The pain was in her left medial calf and she did not notice any swelling, redness, edema of her leg, warmth, etc. She elevated her leg, took aspirin, and the pain went away only to return when she ran again. Since she has been in the emergency room with her leg elevated, she says the pain seems to be resolving once again. She has not had any chest pain. Denies any shortness of breath, coughing, hemoptysis, etc.  PAST MEDICAL HISTORY:  Significant for postural orthostatic tachycardia syndrome. She has had a pacemaker insertion for this syndrome in 1999.  CURRENT MEDICATIONS: 1. Zofran 8 mg q.d. 2. Clonidine 0.1 mg in the morning, 0.2 mg in the evening. 3. ProAmatine 5 mg t.i.d. 4. Celexa 20 mg q.d. 5. Aspirin one q.d. 6. Pindolol 5 mg b.i.d. 7. Sodium chloride tablets two q.d.  ALLERGIES:  PENICILLIN and SULFA.  PHYSICAL EXAMINATION:  GENERAL:  The patient is alert, in no distress.  LUNGS:  Clear.  HEART:  Regular rhythm.  ABDOMEN:  Nontender.  EXTREMITIES:  Reveal no obvious swelling of either leg. She has a little bit of tenderness along the left medial calf but there is no overlying warmth or erythema. She has no edema of her left lower extremity. Peripheral pulses are intact. Circumference of her legs is measured and at 21 cm above the medial malleolus her calves are 36.5 cm on the right, 36 cm on the left. At 23 cm above the anterior tibial tuberosity her thighs are 49.5  cm on the right, 48 cm on the left. Venous Doppler studies of her left lower extremity are negative for DVT. Multiple attempts at venogram by the radiologist were unsuccessful.  DIAGNOSES: 1. Leg pain without objective evidence of deep vein thrombosis. Consider    superficial phlebitis versus muscle pain. 2. Postural orthostatic tachycardia syndrome.  PLAN:  We will send the patient home, have her elevate her left leg, no strenuous exercise, and she is to apply heat to her calf and take aspirin and is to see me in the office on Monday or Tuesday. She is to call p.r.n. if she has any new symptoms, if her leg pain worsens, etc. DD:  09/02/99 TD:  09/03/99 Job: [id number]

## 2010-07-14 NOTE — Discharge Summary (Signed)
[name], [name]              ACCOUNT NO.:  [id number]   MEDICAL RECORD NO.:  [id number]          PATIENT TYPE:  INP   LOCATION:  1307                         FACILITY[facility]  [facility]   PHYSICIAN:  Hedwig Morton. Juanda Chance, MD     DATE OF BIRTH:  July 31, 1955   DATE OF ADMISSION:  08/12/2006  DATE OF DISCHARGE:  08/13/2006                               DISCHARGE SUMMARY   ADMITTING DIAGNOSES:  41. A 55 year old white female admitted for bowel prep and colonoscopy      with a history of left lower quadrant pain and chronic intermittent      diarrhea.  2. 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  3. Status post pacemaker placement in 1999.   DISCHARGE DIAGNOSES:  1. Stable post colonoscopy with findings of moderate to severe sigmoid      diverticulosis suspect symptoms due to irritable bowel syndrome.  2. 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  3. Status post pacemaker placement in 1999.   CONSULTATIONS:  None.   PROCEDURES:  Colonoscopy and biopsies per Dr. [name].   BRIEF HISTORY:  [name] is a 55 year old, white female with a complicated  past medical history.  She is admitted to undergo bowel prep and  colonoscopy to evaluate left lower quadrant abdominal pain and history  of diverticulitis and intermittent diarrhea.  She has a history of POTS  syndrome and is status post pacemaker placement in '99 and then ablation  in 2002. It was felt that she was at high risk for orthostatic syncope  during the prep and has had multiple syncopal episodes over the years.  She is on multiple medications and on home IV fluids due to problems  with frequent dehydration.   LABORATORY STUDIES:  On admission WBC of 4.7, hemoglobin 14.4,  hematocrit of 41.9, MCV of 85, sed rate of 5.  Electrolytes within  normal limits,  creatinine 0.6. Liver function studies normal.  Albumin  4.1, antibody for IgG was 0, antibody for IgA was 0.6.  Serum serotonin  level was 100 which is low.   HOSPITAL COURSE:  The patient was admitted to the service of Dr. [name] on observation status in order to undergo colon prep for a  colonoscopy. She was placed on IV fluids, D5 half normal saline at 100  mL an hour and kept on a clear liquid diet.  She was prepped with  MiraLax prep, continued on her usual regimen of home medications.  She  tolerated the prep and underwent colonoscopy the following day with Dr.  Juanda Chance. Again tolerated the procedure without difficulty.  She was found  to have moderate diverticulosis of the sigmoid colon and several deep  wide mouth diverticula, no polyps.  She did have some random biopsies  taken to rule out a microscopic colitis and these returned showing no  active inflammation.  The patient was discharged to home post procedure  with instructions to follow  up with Dr. [name] as an outpatient. No  change in her medication regimen.   NEW MEDICATIONS:  Bentyl 10 mg p.r.n. b.i.d. to t.i.d. as needed for  cramping and diarrhea.   CONDITION ON DISCHARGE:  Stable.      Amy [place], PA-C      Dora M. Juanda Chance, MD  Electronically Signed    AE/MEDQ  D:  08/26/2006  T:  08/26/2006  Job:  [id number]

## 2010-07-14 NOTE — Assessment & Plan Note (Signed)
[facility]                                   ON-CALL NOTE   TREVA, HUYETT                       MRN:          [id number]  DATE:12/09/2005                            DOB:          October 16, 1955    The patient is calling on Sunday December 09, 2005 because she has vaginitis.  She is on 2 medications for diverticulitis.  That is resolving.  However,  she has some vaginitis from antibiotics.  She has taken 1 dose of Monistat-  7, but it has not resolved the problem.  Advised her to continue the  Monistat-7 for a full 7 days and it would take 3 or 4 days for things to  calm down.  However, at the end of 7 days, if it is not better, to call and  we would call her in some Diflucan.            ______________________________  Eugenio Hoes. Tawanna Cooler, MD      JAT/MedQ  DD:  12/10/2005  DT:  12/10/2005  Job #:  [id number]

## 2010-07-14 NOTE — Procedures (Signed)
[facility]                                EXERCISE TREADMILL   MAANSI, WIKE                     MRN:          [id number]  DATE:01/02/2006                            DOB:          May 11, 1955    INDICATIONS:  Ms. Causey is a 55 year old woman with a history of  dysautonomia, tough-to-evaluate exercise tolerance, blood pressure and heart  rate response.   TREADMILL TEST:  The patient exercised in the Bruce protocol.   Baseline EKG: Sinus rhythm 78 beats per minute.  Baseline blood pressure  123/94.   The patient exercised through Stage II, and then Stage II was held.  She  continued to exercise at 2.5 miles per hour with a 12% incline (Stage II) to  complete a total of 15 minutes 40 seconds.  This was for a total of 7 METS.   Her heart rate initially quickly increased in the first minute to 107.  At  the end of Stage I, 133.  First minute Stage II, 157.  End of Stage II, 171.  The patient then remained tame with heart rate of 160s to low 170s for the  rest of the study.  Blood pressure at the end of 1 minute of exercise was  145/82, at the end of 2 minutes, 164/91.  At 4 minutes, 202/97; 5 minutes,  201/87. This was maintained until minute 8.  Blood pressure went down to  186/80, stayed in the 180s until 12 minutes when it went to upper 160s,  169/79.  By 15 minutes 40 seconds, blood pressure was 163/74.   The patient felt warm throughout but no other complaints.  Stopped because  of fatigue.   In recovery period, the heart rate went from 162 by 3 minutes 131, by 6  minutes 110 beats per minute.  Blood pressure rapidly came down at 165/82; 2  minutes, 111/73; 3 minutes, 120/80; by 5 minutes, 104/84.  Again, patient  feeling okay.   IMPRESSION:  Exercise treadmill.  Patient without chest pain or significant  dizziness throughout.  Heart rate showed rapid increase.  Studied in the  160s to 170 range.  Blood pressure was low 200s down  to 170 range, 160s at  peak exercise.  There were no EKG changes to suggest any ischemia.    ______________________________  Pricilla Riffle, MD, [facility]    PVR/MedQ  DD: 01/02/2006  DT: 01/02/2006  Job #: [id number]   cc:   Neta Mends. Fabian Sharp, MD  Princella Pellegrini, M.D.

## 2010-07-18 ENCOUNTER — Telehealth: Payer: Self-pay | Admitting: *Deleted

## 2010-07-18 DIAGNOSIS — I10 Essential (primary) hypertension: Secondary | ICD-10-CM

## 2010-07-18 MED ORDER — CLONIDINE HCL 0.1 MG PO TABS: 0.1000 mg | ORAL_TABLET | Freq: Two times a day (BID) | ORAL | Status: DC

## 2010-07-18 NOTE — Telephone Encounter (Signed)
OK to  Increase Clonidine to .1mg  2 times per day per Dr.Ross. Patient aware and new script sent to pharmacy.

## 2010-07-25 NOTE — Op Note (Signed)
[name], [name]              ACCOUNT NO.:  [id number]  MEDICAL RECORD NO.:  [id number]           PATIENT TYPE:  O  LOCATION:  XRAY                         FACILITY:  [facility]  PHYSICIAN:  Amaris Delafuente T. Lazarus Salines, M.D. DATE OF BIRTH:  08-28-1955  DATE OF PROCEDURE:  07/12/2010 DATE OF DISCHARGE:                              OPERATIVE REPORT   PREOPERATIVE DIAGNOSIS:  Left epistaxis, prenasal synechium.  POSTOPERATIVE DIAGNOSIS:  Left epistaxis, prenasal synechium.  PROCEDURE PERFORMED:  Left nasal endoscopy with cautery control epistaxis.  Excision nasal synechium with modified septoplasty.  SURGEON:  Gloris Manchester. Aniella Wandrey, MD  ANESTHESIA:  General orotracheal.  BLOOD LOSS:  Minimal.  COMPLICATIONS:  None.  FINDINGS:  A 5-mm high irregular synechial band from the nasal septum to the anterior pole of the nasal inferior turbinate and just behind the nasal valve on the left side.  A small vascular plexus in the high vault of the nose, possibly a bleeding site but no active bleeding noted. Relatively narrow left anterior nose both at the maxillary crest and to a small degree at the piriform aperture.  PROCEDURE:  With the patient in a comfortable supine position, having received preoperative Afrin spray, general orotracheal anesthesia was induced without difficulty.  Routine precautions for malignant hyperthermia and considering her pacemaker were carried out throughout the case.  At an appropriate level, the patient was placed in a slight sitting position.  A saline moistened throat pack was placed.  Nasal vibrissae were trimmed on the left side.  Additional Afrin solution was applied on 1.5 x 3 inch cottonoids to the interior of the left nose.  Xylocaine 1.5% with 1:200,000 epinephrine, 6 mL total was infiltrated into the anterior submucoperichondrial plane of the septum, into the anterior floor of the nose beneath the synechium, and into the anterior pole of the left inferior  turbinate.  Several minutes were allowed for intraoperative hemostasis to take effect.  A sterile preparation and draping the midface was accomplished.  The materials were removed from the nose and observed to be intact and correct in number.  Using first a 0 degree nasal endoscope and then the 30-degree scope, the interior of the nose was carefully inspected.  It was somewhat narrow.  There was a possible dilated vein in the vault of the nose on the left side which was controlled with electrocautery.  No bleeding was noted.  Addressing the synechium, the anterior edge on the septum was incised and then with dissection of the submucoperichondrial plane, this was freed from the septum.  The dissection was carried back onto the floor of the nose and the soft tissue was carefully dissected from the maxillary crest.  Working on its lateral aspect, the synechium was freed from the anterior pole of the inferior turbinate and from the lateral wall of the nose.  Finally, it was completely dissected and was removed and sent off for pathologic interpretation.  Some of the quadrangular cartilage which was prominent at the maxillary crest was submucosally resected and discarded.  Working at the [employer], this was opened down to the bone and a small portion was nibbled away  with an open Jansen-Middleton forceps.  The anterior pole of the inferior turbinate was incised just behind the nasal valve and a portion of the mucosa and underlying bone was resected again using a Leane Para- Middleton forceps.  This allowed a wider nasal vestibule without any immediate approximation of the soft tissues.  A 2 x 4 cm rectangular rotation flap medially above the excision site of the septum was incised and dissected in the submucoperichondrial plane and rotated down to the floor of the nose.  It was secured through-and- through to the cartilaginous septum and the anterior mucosa was approximated to  prevent repeat synechium formation inferiorly.  The flap was viable and hemostasis was observed.  A small raw area of cartilage was left at the completion.  At this point, the synechial area was covered on one side with good mucosa and the nose had been made more wide in the same vicinity.  A 0.040 reinforced Silastic splint was fashioned to tuck down to the floor of the nose and up the nasal septum and a smaller piece on the opposite side and these were both held in place with a 3-0 Ethilon stitch.  Turbinate was outfractured.  A triple thickness Telfa pack with bacitracin ointment was fashioned and applied between the inferior turbinate and the septal splint.  This completed the procedure.  The pharynx was suctioned clear and hemostasis was observed.  The throat pack was removed.  The patient was returned to Anesthesia, awakened, extubated, and transferred to recovery in stable condition.  COMMENT:  A 55 year old white female with nasal synechium formation status post once prior attempted repair, but with now atrial fibrillation needing anticoagulation and with persistent and moderately severe epistaxis was the indication for today's procedure.  Anticipate routine postoperative recovery with removal of nasal packing 24 hours, removal of septal splints in 10 days, ice, elevation, analgesia.  We will observe carefully given her known diabetes and autonomic instability.  We will make a referral to [facility] so this question can be definitively answered so that in the future she does not bear undue risk.     Gloris Manchester. Lazarus Salines, M.D.     KTW/MEDQ  D:  07/12/2010  T:  07/12/2010  Job:  [id number]  cc:   Pricilla Riffle, MD, [facility] Neta Mends. Fabian Sharp, MD  Electronically Signed by Flo Shanks M.D. on 07/25/2010 10:49:15 AM

## 2010-08-04 ENCOUNTER — Telehealth: Payer: Self-pay | Admitting: Internal Medicine

## 2010-08-04 DIAGNOSIS — I471 Supraventricular tachycardia: Secondary | ICD-10-CM

## 2010-08-04 NOTE — Telephone Encounter (Signed)
I spoke with the patient. We will schedule her for a PPM gen change on 08/25/10. I will call her back next week with the details. She is agreeable.

## 2010-08-04 NOTE — Telephone Encounter (Signed)
Per pt calling, status of pacemaker replacement on the week on 6/18.

## 2010-08-16 ENCOUNTER — Encounter: Payer: Self-pay | Admitting: *Deleted

## 2010-08-17 ENCOUNTER — Telehealth: Payer: Self-pay | Admitting: Internal Medicine

## 2010-08-17 NOTE — Telephone Encounter (Signed)
Pt calling in ref to pacemaker replacement 6-29 knows it will be tomorrow

## 2010-08-18 ENCOUNTER — Other Ambulatory Visit: Payer: Self-pay | Admitting: Internal Medicine

## 2010-08-18 ENCOUNTER — Encounter: Payer: Self-pay | Admitting: *Deleted

## 2010-08-18 NOTE — Telephone Encounter (Signed)
I spoke to the patient to give her the time for her generator change. She tells me that she had nose surgery about 3 weeks ago and developed a staph infection. She was on Keflex. Dr. Lazarus Salines has done another culture on her which is pending, but hopefully the results will be back today. 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 She was started on Doxycycline three times a day and Clindamycin 300mg  three times a day on Tuesday this week. I made her aware I will let Dr. Graciela Husbands know about this and we will need to see when we will do her procedure. I will call her back today.

## 2010-08-18 NOTE — Telephone Encounter (Signed)
I reviewed with Dr. Graciela Husbands and we need to r/s the patient for 2 weeks out. I have spoken with the patient. She wants to go on 09/13/10. I have scheduled this and will call her back with her instructions.

## 2010-08-22 ENCOUNTER — Other Ambulatory Visit: Payer: BC Managed Care – PPO | Admitting: *Deleted

## 2010-09-06 ENCOUNTER — Telehealth: Payer: Self-pay | Admitting: Internal Medicine

## 2010-09-06 DIAGNOSIS — E7800 Pure hypercholesterolemia, unspecified: Secondary | ICD-10-CM

## 2010-09-06 NOTE — Telephone Encounter (Signed)
Pt would like to talk to [employer]. Pt has question re her meds.

## 2010-09-06 NOTE — Telephone Encounter (Signed)
I left a message for the patient that she does not need to hold plavix per Dr. Graciela Husbands.

## 2010-09-06 NOTE — Telephone Encounter (Signed)
Order for lipid

## 2010-09-06 NOTE — Telephone Encounter (Signed)
I spoke with the patient. She states she will see Dr. Lazarus Salines on Friday 7/13 to f/u on her nasal infection. She is still on antibiotics. We will wait to see what Dr. Lazarus Salines tells her about the status of her infection. She also wanted to know about holding plavix prior to her changeout. I explained I have already sent Dr. Graciela Husbands a message about this today. I will call her back this afternoon about whether or not she needs to hold this prior to her procedure. She also states that she saw her endocrinologist yesterday and they recommend close f/u of her lipid panel. We will add this on to her labs to be drawn tomorrow and will forward results to Dr. Tenny Craw.

## 2010-09-07 ENCOUNTER — Other Ambulatory Visit: Payer: BC Managed Care – PPO | Admitting: *Deleted

## 2010-09-07 LAB — PACEMAKER DEVICE OBSERVATION
AL AMPLITUDE: 4.8294 mv
AL IMPEDENCE PM: 400 Ohm
AL THRESHOLD: 1 V
ATRIAL PACING PM: 31.23
BAMS-0001: 135 {beats}/min
BATTERY VOLTAGE: 2.97 V
RV LEAD AMPLITUDE: 3.1219 mv
RV LEAD IMPEDENCE PM: 568 Ohm
RV LEAD THRESHOLD: 2 V
VENTRICULAR PACING PM: 0

## 2010-09-08 ENCOUNTER — Telehealth: Payer: Self-pay | Admitting: *Deleted

## 2010-09-08 DIAGNOSIS — IMO0002 Reserved for concepts with insufficient information to code with codable children: Secondary | ICD-10-CM

## 2010-09-08 NOTE — Telephone Encounter (Addendum)
Patient called to report that she saw her ENT physician today and he advised her that she still has a staff infection in her nose and is treating her with antibiotics. He advised that she cancell her pacer change out for 7/18. She also wanted to know if she could stop Plavix and start Xarelto. Discussed with Dr.Ross and Dr.Klein and they are aware of above and are OK with medication change. Called the cath lab and cancelled procedure for next week. She will call us back when infection is cleared up. Patient has a ROV with ENT on 8/6.

## 2010-09-11 ENCOUNTER — Telehealth: Payer: Self-pay | Admitting: Internal Medicine

## 2010-09-11 MED ORDER — RIVAROXABAN 20 MG PO TABS: 20.0000 mg | ORAL_TABLET | Freq: Every day | ORAL | Status: DC

## 2010-09-11 NOTE — Telephone Encounter (Signed)
There is an interaction between xarelto 20mg  and phenobarbital 64mg  and they need to talk to someone about prior to filling it

## 2010-09-11 NOTE — Telephone Encounter (Signed)
Spoke with pharm. at [facility] and he advised me that taking Phenobarb with Xarelto may make the Xarelto less effective. Dr.Ross aware of above and advised to fill anyway.

## 2010-09-13 ENCOUNTER — Ambulatory Visit (HOSPITAL_COMMUNITY)
Admission: RE | Admit: 2010-09-13 | Payer: BC Managed Care – PPO | Source: Ambulatory Visit | Admitting: Internal Medicine

## 2010-09-18 ENCOUNTER — Telehealth: Payer: Self-pay | Admitting: Internal Medicine

## 2010-09-18 NOTE — Telephone Encounter (Signed)
Left message for Autumn RN to call back.

## 2010-09-18 NOTE — Telephone Encounter (Signed)
[facility] will send form to give pt another six more month of sevices.

## 2010-09-22 NOTE — Telephone Encounter (Signed)
Orders faxed to [facility]. 

## 2010-09-29 ENCOUNTER — Telehealth: Payer: Self-pay | Admitting: Internal Medicine

## 2010-09-29 NOTE — Telephone Encounter (Signed)
Per pt call, pt needs to talk to someone ASAP about blood thinner RX pt was recently put on. Please return pt call to advise/discuss.

## 2010-09-29 NOTE — Telephone Encounter (Signed)
I called and spoke with [facility] pharmacy at [facility]. And made them aware that the patient is coming off Xarelto and will be restarting plavix. She will not be taking both together.

## 2010-09-29 NOTE — Telephone Encounter (Signed)
I spoke with the patient and she states that she thinks she is having some side effects from the xarelto that she is taking. She gets headaches and feels disoriented during the morning. She is having significant bruising. In reviewing with Dr. Graciela Husbands and Weston Brass, Pharm D, headaches and disorientation are not common side effects from xarelto. Per Dr. Graciela Husbands, he recommends that the patient stop xarelto and restart plavix x 2 weeks to see if the symptoms resolve. The patient states her pharmacist explained to her that xarelto and phenobarbital were contraindicated. This may be the cause of her symptoms. We will see how she is doing in 2 weeks. She would also like to go ahead and reschedule her device change. We will look at doing this on 8/20. I will call her back with instructions.

## 2010-10-05 ENCOUNTER — Other Ambulatory Visit: Payer: Self-pay | Admitting: *Deleted

## 2010-10-05 DIAGNOSIS — I10 Essential (primary) hypertension: Secondary | ICD-10-CM

## 2010-10-06 ENCOUNTER — Encounter: Payer: Self-pay | Admitting: *Deleted

## 2010-10-06 ENCOUNTER — Telehealth: Payer: Self-pay | Admitting: *Deleted

## 2010-10-06 NOTE — Telephone Encounter (Signed)
I spoke with the patient in regards to her PPM generator change on 8/20. Labs on 8/15. She states she feels much better off of Xarelto. There was possibly an interaction between that and her phenobarbital. She states that she has seen neuro in followup. She wanted Dr. Graciela Husbands to know that she is having a lot of vision changes since her stroke. She is more nearsighted and is losing some of her peripheral vision. She thinks she needs to be back on Xarelto, but will have to come off phenobarbital first. She would like to discuss her options with Dr. Graciela Husbands after her generator change regarding blood thinners. I explained I will forward this to Dr. Graciela Husbands as an Lorain Childes. She is agreeable.

## 2010-10-11 ENCOUNTER — Other Ambulatory Visit (INDEPENDENT_AMBULATORY_CARE_PROVIDER_SITE_OTHER): Payer: BC Managed Care – PPO | Admitting: *Deleted

## 2010-10-11 ENCOUNTER — Other Ambulatory Visit: Payer: Self-pay | Admitting: Internal Medicine

## 2010-10-11 ENCOUNTER — Telehealth: Payer: Self-pay | Admitting: *Deleted

## 2010-10-11 DIAGNOSIS — I498 Other specified cardiac arrhythmias: Secondary | ICD-10-CM

## 2010-10-11 DIAGNOSIS — I471 Supraventricular tachycardia: Secondary | ICD-10-CM

## 2010-10-11 DIAGNOSIS — E7800 Pure hypercholesterolemia, unspecified: Secondary | ICD-10-CM

## 2010-10-11 LAB — LIPID PANEL
Cholesterol: 203 mg/dL — ABNORMAL HIGH (ref 0–200)
HDL: 61.5 mg/dL (ref >39.00)
Total CHOL/HDL Ratio: 3
Triglycerides: 241 mg/dL — ABNORMAL HIGH (ref 0.0–149.0)
VLDL: 48.2 mg/dL — ABNORMAL HIGH (ref 0.0–40.0)

## 2010-10-11 LAB — CBC WITH DIFFERENTIAL/PLATELET
Basophils Absolute: 0 10*3/uL (ref 0.0–0.1)
Basophils Relative: 0.6 % (ref 0.0–3.0)
Eosinophils Absolute: 0.1 10*3/uL (ref 0.0–0.7)
Eosinophils Relative: 1.7 % (ref 0.0–5.0)
HCT: 40.1 % (ref 36.0–46.0)
Hemoglobin: 13.5 g/dL (ref 12.0–15.0)
Lymphocytes Relative: 22.3 % (ref 12.0–46.0)
Lymphs Abs: 1.3 10*3/uL (ref 0.7–4.0)
MCHC: 33.8 g/dL (ref 30.0–36.0)
MCV: 89.1 fl (ref 78.0–100.0)
Monocytes Absolute: 0.6 10*3/uL (ref 0.1–1.0)
Monocytes Relative: 11.1 % (ref 3.0–12.0)
Neutro Abs: 3.7 10*3/uL (ref 1.4–7.7)
Neutrophils Relative %: 64.3 % (ref 43.0–77.0)
Platelets: 226 10*3/uL (ref 150.0–400.0)
RBC: 4.51 Mil/uL (ref 3.87–5.11)
RDW: 12.6 % (ref 11.5–14.6)
WBC: 5.7 10*3/uL (ref 4.5–10.5)

## 2010-10-11 LAB — BASIC METABOLIC PANEL
BUN: 16 mg/dL (ref 6–23)
CO2: 29 mEq/L (ref 19–32)
Calcium: 9 mg/dL (ref 8.4–10.5)
Chloride: 105 mEq/L (ref 96–112)
Creatinine, Ser: 0.8 mg/dL (ref 0.4–1.2)
GFR: 77.57 mL/min (ref >60.00)
Glucose, Bld: 98 mg/dL (ref 70–99)
Potassium: 4.1 mEq/L (ref 3.5–5.1)
Sodium: 141 mEq/L (ref 135–145)

## 2010-10-11 LAB — LDL CHOLESTEROL, DIRECT: Direct LDL: 109.7 mg/dL

## 2010-10-11 LAB — PROTIME-INR
INR: 1 ratio (ref 0.8–1.0)
Prothrombin Time: 10.8 s (ref 10.2–12.4)

## 2010-10-11 NOTE — Telephone Encounter (Signed)
Med update

## 2010-10-11 NOTE — Telephone Encounter (Signed)
The patient was in the office today for pre-procedure labs. She states she has received a call from the pharmacy stating that they would like her to call them with a current list of her medications. She asked that I call them at [phone] to review these since she felt like we had he most accurate list. I have reviewed the patient's medications with her and called Marcelino Duster at the pharmacy to give this list to her.

## 2010-10-11 NOTE — Telephone Encounter (Signed)
Addended by: Sherri Rad C on: 10/11/2010 12:40 PM   Modules accepted: Orders

## 2010-10-13 ENCOUNTER — Telehealth: Payer: Self-pay | Admitting: Internal Medicine

## 2010-10-13 ENCOUNTER — Other Ambulatory Visit: Payer: Self-pay | Admitting: *Deleted

## 2010-10-13 DIAGNOSIS — E782 Mixed hyperlipidemia: Secondary | ICD-10-CM

## 2010-10-13 DIAGNOSIS — I10 Essential (primary) hypertension: Secondary | ICD-10-CM

## 2010-10-13 MED ORDER — CLONAZEPAM 0.5 MG PO TABS: 0.5000 mg | ORAL_TABLET | Freq: Two times a day (BID) | ORAL | Status: DC | PRN

## 2010-10-13 MED ORDER — PHENOBARBITAL 64.8 MG PO TABS: 64.8000 mg | ORAL_TABLET | Freq: Every day | ORAL | Status: DC

## 2010-10-13 MED ORDER — CLONIDINE HCL 0.1 MG PO TABS: 0.1000 mg | ORAL_TABLET | Freq: Two times a day (BID) | ORAL | Status: DC

## 2010-10-13 MED ORDER — SIMVASTATIN 10 MG PO TABS: 10.0000 mg | ORAL_TABLET | Freq: Every evening | ORAL | Status: DC

## 2010-10-13 NOTE — Telephone Encounter (Signed)
Called patient with lab results. Advised refill for Phenobarbital and Klonopin need to be signed by Dr.Ross and will be faxed on 8/20.

## 2010-10-13 NOTE — Telephone Encounter (Signed)
Pt had lab work on [employer]. Wants to make sure it is ok before her implant on Monday.

## 2010-10-13 NOTE — Telephone Encounter (Signed)
Addended by: Vickie Epley on: 10/13/2010 05:58 PM   Modules accepted: Orders

## 2010-10-15 ENCOUNTER — Telehealth: Payer: Self-pay | Admitting: Gastroenterology

## 2010-10-15 DIAGNOSIS — K5732 Diverticulitis of large intestine without perforation or abscess without bleeding: Secondary | ICD-10-CM

## 2010-10-15 MED ORDER — METRONIDAZOLE 500 MG PO TABS: 500.0000 mg | ORAL_TABLET | Freq: Three times a day (TID) | ORAL | Status: AC

## 2010-10-15 MED ORDER — HYOSCYAMINE SULFATE 0.125 MG PO TABS: 0.2500 mg | ORAL_TABLET | ORAL | Status: DC | PRN

## 2010-10-15 MED ORDER — CIPROFLOXACIN HCL 500 MG PO TABS: 500.0000 mg | ORAL_TABLET | Freq: Two times a day (BID) | ORAL | Status: AC

## 2010-10-15 NOTE — Telephone Encounter (Signed)
Got a call on asunday aug 19 from pts husband saying she had acute diverticulitis and fever  We will postposne her procedure

## 2010-10-15 NOTE — Telephone Encounter (Signed)
Patient called stating that she's had 3-4 days of increasingly severe lower, pain. She's had no fever but she has had sweats. She has history of diverticulosis and diverticulitis. These symptoms are similar to her bouts of diverticulitis.  The patient was instructed to begin Cipro and Flagyl and to take hyomax as needed. If symptoms are not improved in the next 24-48 hours she was instructed to contact the office

## 2010-10-16 ENCOUNTER — Other Ambulatory Visit (INDEPENDENT_AMBULATORY_CARE_PROVIDER_SITE_OTHER): Payer: BC Managed Care – PPO

## 2010-10-16 ENCOUNTER — Telehealth: Payer: Self-pay | Admitting: Internal Medicine

## 2010-10-16 DIAGNOSIS — K5792 Diverticulitis of intestine, part unspecified, without perforation or abscess without bleeding: Secondary | ICD-10-CM

## 2010-10-16 DIAGNOSIS — K5732 Diverticulitis of large intestine without perforation or abscess without bleeding: Secondary | ICD-10-CM

## 2010-10-16 LAB — COMPREHENSIVE METABOLIC PANEL
ALT: 40 U/L — ABNORMAL HIGH (ref 0–35)
AST: 30 U/L (ref 0–37)
Albumin: 4.1 g/dL (ref 3.5–5.2)
Alkaline Phosphatase: 94 U/L (ref 39–117)
BUN: 10 mg/dL (ref 6–23)
CO2: 26 mEq/L (ref 19–32)
Calcium: 9.1 mg/dL (ref 8.4–10.5)
Chloride: 104 mEq/L (ref 96–112)
Creatinine, Ser: 1 mg/dL (ref 0.4–1.2)
GFR: 65.45 mL/min (ref >60.00)
Glucose, Bld: 111 mg/dL — ABNORMAL HIGH (ref 70–99)
Potassium: 3.9 mEq/L (ref 3.5–5.1)
Sodium: 140 mEq/L (ref 135–145)
Total Bilirubin: 0.5 mg/dL (ref 0.3–1.2)
Total Protein: 7.3 g/dL (ref 6.0–8.3)

## 2010-10-16 LAB — SEDIMENTATION RATE: Sed Rate: 38 mm/hr — ABNORMAL HIGH (ref 0–22)

## 2010-10-16 NOTE — Telephone Encounter (Signed)
Patient given Dr. Regino Schultze recommendations. She will stop flagyl and will get labs drawn.

## 2010-10-16 NOTE — Telephone Encounter (Signed)
Patient had a diverticulitis flare over the weekend. Yesterday, Dr. Arlyce Dice started her on Cipro and Flagyl. She is on a clear liquid diet. The pain is better.  She takes heart medications and with the antibiotics, she is having dry heaves . She has [facility] coming weekly to give her IVF and is wondering if she could get some nutrition IV. Please, advise.

## 2010-10-16 NOTE — Telephone Encounter (Signed)
Please ask pt to  stop Flagyl since it is making her nauseated and continue Cipro as prescribed by Dr Arlyce Dice. Please let her come for blood tests to see is she needs IV nutrition..Please order c-met and prealbumin level, sed rate.

## 2010-10-17 ENCOUNTER — Telehealth: Payer: Self-pay | Admitting: *Deleted

## 2010-10-17 LAB — PREALBUMIN: Prealbumin: 22.7 mg/dL (ref 17.0–34.0)

## 2010-10-17 NOTE — Telephone Encounter (Signed)
Patient notified of results and recommendations. Labs faxed to patient at [phone] per her request.

## 2010-10-17 NOTE — Telephone Encounter (Signed)
Message copied by Daphine Deutscher on Tue Oct 17, 2010  8:34 AM ------      Message from: Hart Carwin      Created: Mon Oct 16, 2010  5:44 PM       Please call pt, all labs are normal, she  Has a normal albumin level and K+, I would hold off on cTNA. Unless she cannot eat for a week.

## 2010-10-18 ENCOUNTER — Telehealth: Payer: Self-pay | Admitting: *Deleted

## 2010-10-18 NOTE — Telephone Encounter (Signed)
[name] called this morning to update [name] on how she is feeling. She has been afebrile since Saturday. She is was on Flagyl and Cipro per Dr. [name] for her flare up of diverticulitis. Flagyl has been d/c'ed due to stomach pain. She continues on Cipro. She wanted to try to r/s her procedure for next week with Dr. Graciela Husbands, but I have explained to her that next week is booked and the first opening I have is on 11/01/10. We will tentatively schedule her for this date, but I will call her back next week to let her know if I have had any cancellations, and if not, we will schedule her for 9/5. She is agreeable.

## 2010-10-20 ENCOUNTER — Telehealth: Payer: Self-pay | Admitting: *Deleted

## 2010-10-20 NOTE — Telephone Encounter (Signed)
Called patient to make sure that she received all 3 month supply refills that she needed. Had to fax the Phenobarbital and Klonopin to [facility] ( could not be E-scribed). Patient states that all her refills are in order.

## 2010-10-27 NOTE — Telephone Encounter (Signed)
Late entry. I spoke with the patient on 8/29 and made her aware of the date on time for her PPM gen change on 11/01/10 @ 3:30pm. She is to arrive at [facility] at 1:30pm and will have labs drawn at the hospital.

## 2010-11-01 ENCOUNTER — Observation Stay (HOSPITAL_COMMUNITY)
Admission: RE | Admit: 2010-11-01 | Discharge: 2010-11-02 | Disposition: A | Discharge status: Home / Self Care | Payer: BC Managed Care – PPO | Source: Ambulatory Visit | Attending: Internal Medicine | Admitting: Internal Medicine

## 2010-11-01 DIAGNOSIS — Y831 Surgical operation with implant of artificial internal device as the cause of abnormal reaction of the patient, or of later complication, without mention of misadventure at the time of the procedure: Secondary | ICD-10-CM | POA: Insufficient documentation

## 2010-11-01 DIAGNOSIS — Z45018 Encounter for adjustment and management of other part of cardiac pacemaker: Principal | ICD-10-CM | POA: Insufficient documentation

## 2010-11-01 DIAGNOSIS — Z01812 Encounter for preprocedural laboratory examination: Secondary | ICD-10-CM | POA: Insufficient documentation

## 2010-11-01 DIAGNOSIS — T82190A Other mechanical complication of cardiac electrode, initial encounter: Secondary | ICD-10-CM | POA: Insufficient documentation

## 2010-11-01 DIAGNOSIS — R55 Syncope and collapse: Secondary | ICD-10-CM

## 2010-11-01 LAB — BASIC METABOLIC PANEL
BUN: 10 mg/dL (ref 6–23)
CO2: 27 mEq/L (ref 19–32)
Calcium: 10 mg/dL (ref 8.4–10.5)
Chloride: 104 mEq/L (ref 96–112)
Creatinine, Ser: 0.61 mg/dL (ref 0.50–1.10)
GFR calc Af Amer: >60 mL/min (ref >60)
GFR calc non Af Amer: >60 mL/min (ref >60)
Glucose, Bld: 101 mg/dL — ABNORMAL HIGH (ref 70–99)
Potassium: 4.5 mEq/L (ref 3.5–5.1)
Sodium: 141 mEq/L (ref 135–145)

## 2010-11-01 LAB — CBC
HCT: 40.4 % (ref 36.0–46.0)
Hemoglobin: 14.4 g/dL (ref 12.0–15.0)
MCH: 30.1 pg (ref 26.0–34.0)
MCHC: 35.6 g/dL (ref 30.0–36.0)
MCV: 84.5 fL (ref 78.0–100.0)
Platelets: 278 10*3/uL (ref 150–400)
RBC: 4.78 MIL/uL (ref 3.87–5.11)
RDW: 12.3 % (ref 11.5–15.5)
WBC: 4.9 10*3/uL (ref 4.0–10.5)

## 2010-11-01 LAB — SURGICAL PCR SCREEN
MRSA, PCR: NEGATIVE
Staphylococcus aureus: POSITIVE — AB

## 2010-11-01 LAB — GLUCOSE, CAPILLARY
Glucose-Capillary: 87 mg/dL (ref 70–99)
Glucose-Capillary: 103 mg/dL — ABNORMAL HIGH (ref 70–99)
Glucose-Capillary: 138 mg/dL — ABNORMAL HIGH (ref 70–99)

## 2010-11-01 LAB — APTT: aPTT: 29 seconds (ref 24–37)

## 2010-11-01 LAB — PROTIME-INR
INR: 0.93 (ref 0.00–1.49)
Prothrombin Time: 12.7 seconds (ref 11.6–15.2)

## 2010-11-02 ENCOUNTER — Telehealth: Payer: Self-pay | Admitting: *Deleted

## 2010-11-02 NOTE — Telephone Encounter (Signed)
The patient called this morning stating that she had a reaction to vancomycin yesterday post procedure. She developed a rash and was given benadryl. She is being discharged home today. I explained I will add this to her allergy list.

## 2010-11-09 ENCOUNTER — Telehealth: Payer: Self-pay | Admitting: *Deleted

## 2010-11-09 ENCOUNTER — Ambulatory Visit (INDEPENDENT_AMBULATORY_CARE_PROVIDER_SITE_OTHER): Payer: BC Managed Care – PPO | Admitting: *Deleted

## 2010-11-09 DIAGNOSIS — I459 Conduction disorder, unspecified: Secondary | ICD-10-CM

## 2010-11-09 DIAGNOSIS — I471 Supraventricular tachycardia: Secondary | ICD-10-CM

## 2010-11-09 DIAGNOSIS — G9001 Carotid sinus syncope: Secondary | ICD-10-CM

## 2010-11-09 MED ORDER — RIVAROXABAN 20 MG PO TABS: 1.0000 | ORAL_TABLET | Freq: Every day | ORAL | Status: DC

## 2010-11-09 NOTE — Progress Notes (Signed)
Pacer check in clinic  

## 2010-11-09 NOTE — Telephone Encounter (Signed)
The patient was in the office today and left a message with the front desk that she needs a prescription for Xarelto 20mg  one tablet daily for #90 sent to [facility] at [facility].

## 2010-11-15 LAB — PACEMAKER DEVICE OBSERVATION
AL AMPLITUDE: 6.2 mv
AL IMPEDENCE PM: 390 Ohm
AL THRESHOLD: 0.9 V
AL THRESHOLD: 0.9 V
ATRIAL PACING PM: 73
BAMS-0001: 160 {beats}/min
BAMS-0002: 4 ms
DEVICE MODEL PM: 66195584
RV LEAD AMPLITUDE: 7.3 mv
RV LEAD IMPEDENCE PM: 487 Ohm
RV LEAD THRESHOLD: 2.2 V
RV LEAD THRESHOLD: 2.2 V

## 2010-11-20 ENCOUNTER — Telehealth: Payer: Self-pay | Admitting: Internal Medicine

## 2010-11-20 NOTE — Op Note (Signed)
  NAMESTAR, RESLER NO.:  [id number]  MEDICAL RECORD NO.:  [id number]  LOCATION:  2505                         FACILITY:  [facility]  PHYSICIAN:  Duke Salvia, MD, FACCDATE OF BIRTH:  [birth year]  DATE OF PROCEDURE:  11/01/2010 DATE OF DISCHARGE:                              OPERATIVE REPORT   PREOPERATIVE DIAGNOSIS:  Previously implanted pacemaker; syncope, now at elective replacement indicator.  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  Following the obtained informed consent, the patient was brought to the electrophysiology laboratory and placed on the fluoroscopic table in supine position.  After routine prep and drape of the left upper chest, lidocaine was infiltrated just below the line of the previous incision and carried down to the layer of device pocket using sharp dissection electrocautery.  The pocket was opened.  The device was explanted.  Heme discoloration of the atrial lead and the ventricular leads were noted. There was a central point on the atrial lead.  Both of these leads were encased in medical adhesive for the exposed length.  In fact that exposed length up to a total of about 6-8 inches.  This allowed me to extend the floor of the pocket to allow for housing of the new pacemaker.  Interrogation of the previously implanted right ventricular lead demonstrated a threshold of 2 volts at a pace impedance of 614 and amplitude of 6.9, the atrial amplitude was 6.9, also with a pace impedance of 419, a threshold of 0.5 and 0.5.  The atrial lead was a Pacesetter [id number] marked with a red band and ventricular lead was a Medtronic [id number].  These leads were then attached to a Biotronik EVIA pulse generator serial number [id number].  Occasional ventricular paced beats were identified.  The pocket was copiously irrigated  with antibiotic containing saline solution.  Hemostasis was assured.  The leads and the pulse generator were placed in the pocket.  Surgicel was placed at the cephalad aspect of the pocket where the pocket was extended to the caudal and medial aspect where we had to extend the pocket and to remove some of them to get the leads out.  The pocket was copiously irrigated with antibiotic containing saline solution and the wound was then closed in three layers in a normal fashion.  The wound was washed, dried, and a Dermabond dressing was applied.  The patient tolerated the procedure without apparent complication.     Duke Salvia, MD, [facility]     SCK/MEDQ  D:  11/01/2010  T:  11/02/2010  Job:  [id number]  Electronically Signed by Sherryl Manges MD [facility] on 11/20/2010 04:04:09 PM

## 2010-11-20 NOTE — Telephone Encounter (Signed)
Per Autumn from [facility]. Will send form  over to be sign 4/85.

## 2010-11-21 LAB — CBC
HCT: 40.7
Hemoglobin: 14.1
MCHC: 34.5
MCV: 87
Platelets: 252
RBC: 4.68
RDW: 12.5
WBC: 4.7

## 2010-11-21 LAB — BASIC METABOLIC PANEL
BUN: 10
CO2: 29
Calcium: 9.7
Chloride: 106
Creatinine, Ser: 0.71
GFR calc Af Amer: >60
GFR calc non Af Amer: >60
Glucose, Bld: 107 — ABNORMAL HIGH
Potassium: 3.9
Sodium: 142

## 2010-11-22 LAB — BASIC METABOLIC PANEL
BUN: 7
CO2: 30
Calcium: 9.4
Chloride: 105
Creatinine, Ser: 0.78
GFR calc Af Amer: >60
GFR calc non Af Amer: >60
Glucose, Bld: 124 — ABNORMAL HIGH
Potassium: 3.5
Sodium: 141

## 2010-11-22 LAB — CBC
HCT: 42.2
Hemoglobin: 15
MCHC: 35.5
MCV: 87.8
Platelets: 261
RBC: 4.81
RDW: 12.5
WBC: 5.8

## 2010-11-22 LAB — VITAMIN D 25 HYDROXY (VIT D DEFICIENCY, FRACTURES): Vit D, 25-Hydroxy: 20 — ABNORMAL LOW (ref 30–89)

## 2010-11-23 NOTE — Telephone Encounter (Signed)
I left a message for [name] that I have not received a form.

## 2010-11-24 LAB — POCT I-STAT, CHEM 8
BUN: 11
Calcium, Ion: 1.15
Chloride: 105
Creatinine, Ser: 1
Glucose, Bld: 109 — ABNORMAL HIGH
HCT: 44
Hemoglobin: 15
Potassium: 4.2
Sodium: 140
TCO2: 29

## 2010-11-24 LAB — B. BURGDORFI ANTIBODIES: B burgdorferi Ab IgG+IgM: 0.18

## 2010-11-24 LAB — URINALYSIS, ROUTINE W REFLEX MICROSCOPIC
Bilirubin Urine: NEGATIVE
Glucose, UA: NEGATIVE
Hgb urine dipstick: NEGATIVE
Ketones, ur: NEGATIVE
Nitrite: NEGATIVE
Protein, ur: NEGATIVE
Specific Gravity, Urine: 1.008
Urobilinogen, UA: 0.2
pH: 6.5

## 2010-11-24 LAB — GLUCOSE, CAPILLARY: Glucose-Capillary: 101 — ABNORMAL HIGH

## 2010-11-24 LAB — URINE MICROSCOPIC-ADD ON

## 2010-11-24 LAB — PHENOBARBITAL LEVEL: Phenobarbital: 10.3 — ABNORMAL LOW

## 2010-11-24 LAB — PREGNANCY, URINE: Preg Test, Ur: NEGATIVE

## 2010-11-24 LAB — CK: Total CK: 232 — ABNORMAL HIGH

## 2010-11-28 NOTE — Telephone Encounter (Signed)
No form received will close encounter.

## 2010-12-01 LAB — GLUCOSE, CAPILLARY
Glucose-Capillary: 116 mg/dL — ABNORMAL HIGH (ref 70–99)
Glucose-Capillary: 98 mg/dL (ref 70–99)

## 2010-12-01 LAB — COMPREHENSIVE METABOLIC PANEL
ALT: 24 U/L (ref 0–35)
AST: 27 U/L (ref 0–37)
Albumin: 4.2 g/dL (ref 3.5–5.2)
Alkaline Phosphatase: 89 U/L (ref 39–117)
BUN: 12 mg/dL (ref 6–23)
CO2: 25 mEq/L (ref 19–32)
Calcium: 9.2 mg/dL (ref 8.4–10.5)
Chloride: 104 mEq/L (ref 96–112)
Creatinine, Ser: 0.71 mg/dL (ref 0.4–1.2)
GFR calc Af Amer: >60 mL/min (ref >60)
GFR calc non Af Amer: >60 mL/min (ref >60)
Glucose, Bld: 118 mg/dL — ABNORMAL HIGH (ref 70–99)
Potassium: 3.5 mEq/L (ref 3.5–5.1)
Sodium: 137 mEq/L (ref 135–145)
Total Bilirubin: 0.3 mg/dL (ref 0.3–1.2)
Total Protein: 7.3 g/dL (ref 6.0–8.3)

## 2010-12-01 LAB — DIFFERENTIAL
Basophils Absolute: 0 10*3/uL (ref 0.0–0.1)
Basophils Relative: 1 % (ref 0–1)
Eosinophils Absolute: 0.1 10*3/uL (ref 0.0–0.7)
Eosinophils Relative: 1 % (ref 0–5)
Lymphocytes Relative: 36 % (ref 12–46)
Lymphs Abs: 1.9 10*3/uL (ref 0.7–4.0)
Monocytes Absolute: 0.5 10*3/uL (ref 0.1–1.0)
Monocytes Relative: 9 % (ref 3–12)
Neutro Abs: 2.8 10*3/uL (ref 1.7–7.7)
Neutrophils Relative %: 53 % (ref 43–77)

## 2010-12-01 LAB — CBC
HCT: 41.3 % (ref 36.0–46.0)
Hemoglobin: 13.9 g/dL (ref 12.0–15.0)
MCHC: 33.7 g/dL (ref 30.0–36.0)
MCV: 89.2 fL (ref 78.0–100.0)
Platelets: 267 10*3/uL (ref 150–400)
RBC: 4.64 MIL/uL (ref 3.87–5.11)
RDW: 12.1 % (ref 11.5–15.5)
WBC: 5.2 10*3/uL (ref 4.0–10.5)

## 2010-12-01 LAB — PROTIME-INR
INR: 0.9 (ref 0.00–1.49)
Prothrombin Time: 12.6 seconds (ref 11.6–15.2)

## 2010-12-01 LAB — LACTATE DEHYDROGENASE: LDH: 200 U/L (ref 94–250)

## 2010-12-01 LAB — APTT: aPTT: 29 seconds (ref 24–37)

## 2010-12-01 LAB — CEA: CEA: 0.8 ng/mL (ref 0.0–5.0)

## 2010-12-05 LAB — CBC
HCT: 38.5
HCT: 37.9
Hemoglobin: 13.4
Hemoglobin: 12.9
MCHC: 34.9
MCHC: 34.1
MCV: 86.4
MCV: 88.3
Platelets: 270
Platelets: 264
RBC: 4.45
RBC: 4.3
RDW: 12.3
RDW: 12.3
WBC: 5.2
WBC: 4.6

## 2010-12-05 LAB — BASIC METABOLIC PANEL
BUN: 11
CO2: 28
Calcium: 9.6
Chloride: 107
Creatinine, Ser: 0.71
GFR calc Af Amer: >60
GFR calc non Af Amer: >60
Glucose, Bld: 112 — ABNORMAL HIGH
Potassium: 4.3
Sodium: 142

## 2010-12-05 LAB — DIFFERENTIAL
Basophils Absolute: 0
Basophils Relative: 1
Eosinophils Absolute: 0.1
Eosinophils Relative: 1
Lymphocytes Relative: 27
Lymphs Abs: 1.4
Monocytes Absolute: 0.5
Monocytes Relative: 9
Neutro Abs: 3.2
Neutrophils Relative %: 62

## 2010-12-05 LAB — PROTIME-INR
INR: 1
Prothrombin Time: 13.5

## 2010-12-07 NOTE — Discharge Summary (Signed)
[name], [name]              ACCOUNT NO.:  [id number]  MEDICAL RECORD NO.:  [id number]  LOCATION:  2505                         FACILITY:  [facility]  PHYSICIAN:  Doylene Canning. Ladona Ridgel, MD    DATE OF BIRTH:  08-23-1955  DATE OF ADMISSION:  11/01/2010 DATE OF DISCHARGE:  11/02/2010                              DISCHARGE SUMMARY   PRIMARY CARE PHYSICIAN:  Neta Mends. Panosh, MD  PRIMARY CARDIOLOGIST:  Pricilla Riffle, MD, [facility]  ELECTROPHYSIOLOGIST:  Duke Salvia, MD, [facility]  DYSAUTONOMIA SPECIALIST:  [name], MD, [place], [place]  PRIMARY DIAGNOSIS:  Syncope with bradycardia and previously implanted pacemaker, now at elective replacement indicator.  SECONDARY DIAGNOSES: 1. Hyperadrenergic postural orthostatic tachycardia syndrome -     followed by Dr. [name] in [place], [place]. 2. Atrial tachy arrhythmias, documented by device interrogation, felt     to be atrial fibrillation. 3. History of vertigo and numbness earlier this year which was felt to     be a stroke. 4. Melanoma status post excision in December 2009 persistent     moderately severe epistaxis status post cautery and modified     septoplasty in May 2012.  ALLERGIES: 1. PENICILLIN. 2. SULFA. 3. TETRACYCLINE. 4. DOXYCYCLINE. 5. VANCOMYCIN. 6. SUCCINYLCHOLINE.  PROCEDURE THIS ADMISSION:  Pacemaker generator change on November 01, 2010 by Dr. Graciela Husbands.  The patient's previously implanted Medtronic pacemaker was explanted.  The patient received a new Evia DR Biotronik pacemaker.  The patient's previously implanted model number 1342 right atrial lead and previously implanted model number 5092 right ventricular leads were used.  The patient had no early apparent complications.  LABORATORY DATA THIS ADMISSION:  Potassium 4.5, BUN 10, and creatinine 0.61.  INR 0.93.  White count 4.9, hemoglobin 14.4, hematocrit 40.4, and platelets 278.  BRIEF HISTORY OF PRESENT ILLNESS:  [name] is a 55 year old female with a history  of syncope and documented bradyarrhythmia who had pacemaker initially placed in 1999.  She subsequently had a generator change in 2005 and her device once again reached elective replacement indicator.  She was evaluated by Dr. Graciela Husbands in the outpatient setting.  At that time, risks, benefits, and alternatives of generator change were reviewed with the patient.  Because of the patient's underlying dysautonomia syndrome, Dr. [name], her physician in [place] recommended that she be changed that with a Biotronik device that had the ability for CLS therapy.  The patient was agreeable to proceed.  HOSPITAL COURSE:  The patient was admitted on November 01, 2010 for planned generator change of her pacemaker to a Biotronik device.  This was carried out by Dr. Graciela Husbands with details outlined above.  Post pacemaker generator change, she developed a reaction to her vancomycin antibiotic and therefore was admitted for observation.  On the morning of November 02, 2010, Dr. Ladona Ridgel examined the patient.  Plans at that time were to discharge to home.  The patient's Plavix will be discontinued.  Because of the patient's slight hematoma at her incision site, she will be started on Xarelto in 1 week.  At the time that she starts Xarelto, her aspirin will be discontinued.  FOLLOWUP APPOINTMENTS: 1. [facility] on November 09, 2010 at 4:00 p.m. 2. Dr. Graciela Husbands in 3 months - the office will call to schedule that     appointment. 3. Dr. Fabian Sharp as scheduled. 4. Dr. Tenny Craw as scheduled.  DISCHARGE INSTRUCTIONS: 1. Increase activity slowly. 2. No driving for 2 days. 3. Keep incision clean and dry for 1 week.  DISCHARGE MEDICATIONS: 1. Xarelto 20 mg take 1 tablet daily.  The patient should start this     on November 09, 2010.  The patient should stop taking her aspirin     when she starts this medication. 2. Aspirin 325 mg daily to be stopped once Xarelto is started. 3. Wellbutrin XR 150 mg 1  tablet daily. 4. Clonidine 0.1 mg twice daily. 5. Vitamin B 1000 mcg 1 injection IM monthly given by Home Health     nurse. 6. Klor-Con 20 mEq daily. 7. Labetalol 200 mg 1 tablet twice daily. 8. Lorazepam 0.5 mg every 8 hours as needed. 9. Metoprolol tartrate 100 mg 1 tablet twice daily as needed. 10.MiraLax 17 grams daily. 11.Phenobarbital 64.8 mg daily at bedtime. 12.Protonix 40 mg daily. 13.Vitamin D3 1000 units daily. 14.Zofran 8 mg 1 tablet every 8 hours as needed for nausea and     vomiting.  Note:  The patient's Plavix will be discontinued at this time because we will be starting her on Xarelto.  DISPOSITION:  The patient was seen and examined by Dr. Ladona Ridgel on November 02, 2010 and considered stable for discharge.  DURATION OF DISCHARGE ENCOUNTER:  35 minutes.     Gypsy Balsam, RN,BSN   ______________________________ Doylene Canning. Ladona Ridgel, MD    AS/MEDQ  D:  11/02/2010  T:  11/02/2010  Job:  [id number]  cc:   Neta Mends. Fabian Sharp, MD Carlena Sax M.D. Berneice Gandy  Electronically Signed by Gypsy Balsam RNBSN on 11/16/2010 09:21:13 AM Electronically Signed by Lewayne Bunting MD on 12/07/2010 06:41:26 PM

## 2010-12-13 LAB — COMPREHENSIVE METABOLIC PANEL
ALT: 31
AST: 25
Albumin: 4.1
Alkaline Phosphatase: 104
BUN: 8
CO2: 30
Calcium: 9.8
Chloride: 105
Creatinine, Ser: 0.69
GFR calc Af Amer: >60
GFR calc non Af Amer: >60
Glucose, Bld: 110 — ABNORMAL HIGH
Potassium: 4
Sodium: 141
Total Bilirubin: 0.7
Total Protein: 7.5

## 2010-12-13 LAB — CBC
HCT: 41.9
Hemoglobin: 14.4
MCHC: 34.5
MCV: 85.5
Platelets: 270
RBC: 4.89
RDW: 12.7
WBC: 4.7

## 2010-12-13 LAB — TISSUE TRANSGLUTAMINASE, IGA: Tissue Transglutaminase Ab, IgA: 0.6 U/mL (ref <7)

## 2010-12-13 LAB — URINALYSIS, ROUTINE W REFLEX MICROSCOPIC
Bilirubin Urine: NEGATIVE
Glucose, UA: NEGATIVE
Hgb urine dipstick: NEGATIVE
Ketones, ur: NEGATIVE
Nitrite: NEGATIVE
Protein, ur: NEGATIVE
Specific Gravity, Urine: 1.003 — ABNORMAL LOW
Urobilinogen, UA: 0.2
pH: 7

## 2010-12-13 LAB — DIFFERENTIAL
Basophils Absolute: 0
Basophils Relative: 1
Eosinophils Absolute: 0.1
Eosinophils Relative: 1
Lymphocytes Relative: 29
Lymphs Abs: 1.4
Monocytes Absolute: 0.4
Monocytes Relative: 9
Neutro Abs: 2.8
Neutrophils Relative %: 60

## 2010-12-13 LAB — TISSUE TRANSGLUTAMINASE, IGG: Tissue Transglut Ab: 0 U/mL (ref <7)

## 2010-12-13 LAB — URINE MICROSCOPIC-ADD ON

## 2010-12-13 LAB — SEDIMENTATION RATE: Sed Rate: 5

## 2010-12-13 LAB — SEROTONIN SERUM: Serotonin, Serum: 100 ng/mL (ref 100–225)

## 2010-12-17 ENCOUNTER — Other Ambulatory Visit: Payer: Self-pay | Admitting: Internal Medicine

## 2010-12-18 NOTE — Telephone Encounter (Signed)
rx sent

## 2011-01-17 ENCOUNTER — Telehealth: Payer: Self-pay | Admitting: Internal Medicine

## 2011-01-17 ENCOUNTER — Ambulatory Visit (INDEPENDENT_AMBULATORY_CARE_PROVIDER_SITE_OTHER): Payer: BC Managed Care – PPO | Admitting: Internal Medicine

## 2011-01-17 ENCOUNTER — Encounter: Payer: Self-pay | Admitting: Internal Medicine

## 2011-01-17 VITALS — BP 118/78 | HR 94 | Temp 97.9°F | Wt 145.0 lb

## 2011-01-17 DIAGNOSIS — Z8679 Personal history of other diseases of the circulatory system: Secondary | ICD-10-CM

## 2011-01-17 DIAGNOSIS — Z950 Presence of cardiac pacemaker: Secondary | ICD-10-CM

## 2011-01-17 DIAGNOSIS — IMO0002 Reserved for concepts with insufficient information to code with codable children: Secondary | ICD-10-CM

## 2011-01-17 DIAGNOSIS — Z7901 Long term (current) use of anticoagulants: Secondary | ICD-10-CM

## 2011-01-17 DIAGNOSIS — R238 Other skin changes: Secondary | ICD-10-CM

## 2011-01-17 DIAGNOSIS — R239 Unspecified skin changes: Secondary | ICD-10-CM

## 2011-01-17 DIAGNOSIS — M792 Neuralgia and neuritis, unspecified: Secondary | ICD-10-CM

## 2011-01-17 MED ORDER — VALACYCLOVIR HCL 1 G PO TABS: 1000.0000 mg | ORAL_TABLET | Freq: Three times a day (TID) | ORAL | Status: DC

## 2011-01-17 MED ORDER — TRAMADOL HCL 50 MG PO TABS: 50.0000 mg | ORAL_TABLET | Freq: Three times a day (TID) | ORAL | Status: DC | PRN

## 2011-01-17 NOTE — Telephone Encounter (Signed)
Pt states about 1 week ago she started having tingling and numbness on her Right side from her buttocks down her right leg.  Pt states she went to chirocpractor and was told she was not out of alignment.  Pt has bumps that "pop up" when she is hot or takes a hot shower then the bumps disappear when she cools off.   Pt states she cannot take antiinflammatory medication due to a medication cardiology has her on  Pt has been taking Tylenol but the pain is unmanageable.  Pt did have a fever last week but does not have one currently.

## 2011-01-17 NOTE — Patient Instructions (Addendum)
This is a nerve pain. Treat for shingles in case . Can try ocass tramadol but this too could cause drowsiness.   FU with Dr Sandria Manly with continued pain  Unsure if neurontin or lyrical could cause a side effect

## 2011-01-17 NOTE — Progress Notes (Signed)
  Subjective:    Patient ID: [name], female    DOB: March 31, 1955, 55 y.o.   MRN: [id number]  HPI Pt comesin today as a walk in patient asking for evaluation .  Seem by nurse and stable VS and worked into clinic . Co of about 1 week of  burning and tingling pain  Down righy leg . She has a history of a disc problem  L4 L5  but went to her specialist who felt that this was not a problem.   Does chiro ractic.  She describes the pain as Burning itching pain  and gets a rash or redness that comes and goes especially after being in the shower. She states she will go down her leg to her foot. Starting in the right buttocks area. Denies any specific weakness or recent falling.  Remote hx of shingles  Left side 10 year ago. Small bumps .   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  Left sided from afib.   She is now on rivoroxiban  Also . No bleeding but can't take anti-inflammatories. Wonders what she can do for pain instead. Review of Systems No fever chest pain shortness of breath other new symptoms.  Past history family history social history reviewed in the electronic medical record.     Objective:   Physical Exam Well-developed well-nourished in no acute distress her gait is within normal limits. Back no focal tenderness points to the right SI buttocks area of concern where there is a faint blotchy pink erect is a rash with no vesicles or pustules. No obvious focal weakness. Past history and vital signs reviewed     Assessment & Plan:  Acute new-onset neuritis pain with atypical rash it does not really look herpetic at this point in time.  However I wouldn't. We treat for shingles in case this is a prolonged prodrome.  Otherwise other treatments would be pain medication and consider some sort of anti-inflammatory.  We'll begin empiric Valtrex; can use tramadol with caution.   And if persistent and/or progressive would  get an opinion from Dr. love her neurologist. About the advisability of gabapentin or Lyrica  Complicated underlying disease including pots   recent CVA felt from atrial arrhythmia on anticoagulation. And pacer management.  Labile hypertension stable

## 2011-01-17 NOTE — Telephone Encounter (Signed)
**  WALK IN PATIENT**  Pt presented to the office stating she has had shingles for past week.  It is not getting better and requesting to be worked in with PCP.

## 2011-01-19 ENCOUNTER — Encounter: Payer: Self-pay | Admitting: Internal Medicine

## 2011-01-19 DIAGNOSIS — I639 Cerebral infarction, unspecified: Secondary | ICD-10-CM | POA: Insufficient documentation

## 2011-01-19 DIAGNOSIS — Z7901 Long term (current) use of anticoagulants: Secondary | ICD-10-CM | POA: Insufficient documentation

## 2011-01-19 DIAGNOSIS — R239 Unspecified skin changes: Secondary | ICD-10-CM | POA: Insufficient documentation

## 2011-01-19 DIAGNOSIS — M792 Neuralgia and neuritis, unspecified: Secondary | ICD-10-CM | POA: Insufficient documentation

## 2011-01-22 ENCOUNTER — Ambulatory Visit (INDEPENDENT_AMBULATORY_CARE_PROVIDER_SITE_OTHER): Payer: BC Managed Care – PPO | Admitting: Internal Medicine

## 2011-01-22 ENCOUNTER — Encounter: Payer: Self-pay | Admitting: Internal Medicine

## 2011-01-22 DIAGNOSIS — I639 Cerebral infarction, unspecified: Secondary | ICD-10-CM

## 2011-01-22 DIAGNOSIS — G901 Familial dysautonomia [Riley-Day]: Secondary | ICD-10-CM

## 2011-01-22 DIAGNOSIS — I498 Other specified cardiac arrhythmias: Secondary | ICD-10-CM

## 2011-01-22 DIAGNOSIS — G9009 Other idiopathic peripheral autonomic neuropathy: Secondary | ICD-10-CM

## 2011-01-22 DIAGNOSIS — G909 Disorder of the autonomic nervous system, unspecified: Secondary | ICD-10-CM

## 2011-01-22 DIAGNOSIS — I6350 Cerebral infarction due to unspecified occlusion or stenosis of unspecified cerebral artery: Secondary | ICD-10-CM

## 2011-01-22 NOTE — Progress Notes (Signed)
Patient ID: [name], female   DOB: 1956/02/04, 55 y.o.   MRN: [id number]  HPI Patient is a 55 year old with a history of POTS.  She also has a history of atrial fibrillation, atrial tachycardia.  In addition she has a hsitory of CVA.   Since seeh she has undergone a generator change out with a Biotronics device.  She says she has had no signif tachyarrhythmias.    She is still fatigued.  Has problems keeping up with work.   Still with ntermittent dizziness.No syncope.  Allergies  Allergen Reactions  . Doxycycline   . Erythromycin   . Iohexol      Desc: pt. states allergic to dye,-did fine with pre-meds. 02/13/05 06/14/10...pt did not state allergy to contrast, no premeds given, pt tolerated contrast.   . Penicillins   . Sulfonamide Derivatives   . Vancomycin Rash    Current Outpatient Prescriptions  Medication Sig Dispense Refill  . buPROPion (WELLBUTRIN XL) 150 MG 24 hr tablet Take 150 mg by mouth daily.        . Cholecalciferol (VITAMIN D3) 50000 UNITS CAPS Take by mouth daily. 3 x weekly      . cloNIDine (CATAPRES) 0.1 MG tablet Take 1 tablet (0.1 mg total) by mouth 2 (two) times daily.  180 tablet  3  . Cyanocobalamin (VITAMIN B-12 IJ) Inject as directed every 30 (thirty) days.        Marland Kitchen labetalol (NORMODYNE) 200 MG tablet Take 200 mg by mouth 2 (two) times daily.        Marland Kitchen LORazepam (ATIVAN) 0.5 MG tablet Take 0.5 mg by mouth every 8 (eight) hours. 1/2 tablet bid       . metoprolol (LOPRESSOR) 100 MG tablet Take 1 tablet (100 mg total) by mouth 2 (two) times daily.      . ondansetron (ZOFRAN) 8 MG tablet Take by mouth every 8 (eight) hours as needed.       . pantoprazole (PROTONIX) 40 MG tablet Take 40 mg by mouth as needed.       Marland Kitchen PHENobarbital (LUMINAL) 64.8 MG tablet Take 1 tablet (64.8 mg total) by mouth at bedtime.  90 tablet  3  . polyethylene glycol powder (GLYCOLAX/MIRALAX) powder DISSOLVE 1/2 CAPFUL IN AT LEAST 8 OUNCES OF WATER OR JUICE ONCE DAILY.  1581 g  0  .  potassium chloride SA (K-DUR,KLOR-CON) 20 MEQ tablet 20 mEq. 4 by mouth once daily       . Rivaroxaban 20 MG TABS Take 1 tablet by mouth daily.  90 tablet  3  . simvastatin (ZOCOR) 10 MG tablet Take 1 tablet (10 mg total) by mouth every evening.  30 tablet  11  . traMADol (ULTRAM) 50 MG tablet Take 1 tablet (50 mg total) by mouth every 8 (eight) hours as needed for pain. Maximum dose= 8 tablets per day  20 tablet  0  . valACYclovir (VALTREX) 1000 MG tablet Take 1 tablet (1,000 mg total) by mouth 3 (three) times daily.  21 tablet  0    Past Medical History  Diagnosis Date  . Cancer     melanoma of skin  . Arm pain, left   . 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 Diverticulitis   . Hypoglycemia, unspecified   . Other nonspecific abnormal serum enzyme levels   . Cervicalgia   . Disturbance of skin sensation   . Other specified congenital anomalies of nervous system   .  Swelling of limb   . Disorder of bone and cartilage, unspecified   . Atrial tachycardia   . CVA (cerebral infarction)     2012 with dizziness and vision change felt embolic from atrial tachy  . History of pacemaker     Past Surgical History  Procedure Date  . Insert / replace / remove pacemaker     1999  . Ablation saphenous vein w/ rfa     2002  . Tonsillectomy and adenoidectomy   . Carpal tunnel release   . Nose surgery     Family History  Problem Relation Age of Onset  . Heart disease Mother   . Heart disease Father   . Diabetes Maternal Grandmother   . Diabetes Paternal Grandmother     History   Social History  . Marital Status: Married    Spouse Name: N/A    Number of Children: 0  . Years of Education: N/A   Occupational History  . PRESIDENT    Social History Main Topics  . Smoking status: Never Smoker   . Smokeless tobacco: Not on file  . Alcohol Use: No  . Drug Use: No  . Sexually Active: Not on file   Other Topics Concern  . Not on file   Social History  Narrative  . No narrative on file    Review of Systems:  All systems reviewed.  They are negative to the above problem except as previously stated.  Vital Signs: BP 108/64  Pulse 63  Ht 5\' 3"  (1.6 m)  Wt 153 lb 6.4 oz (69.582 kg)  BMI 27.17 kg/m2  Physical Exam  Patient is in NAD  HEENT:  Normocephalic, atraumatic. EOMI, PERRLA.  Neck: JVP is normal. No thyromegaly. No bruits.  Lungs: clear to auscultation. No rales no wheezes.  Heart: Regular rate and rhythm. Normal S1, S2. No S3.   No significant murmurs. PMI not displaced.  Abdomen:  Supple, nontender. Normal bowel sounds. No masses. No hepatomegaly.  Extremities:   Good distal pulses throughout. No lower extremity edema.  Musculoskeletal :moving all extremities.  Neuro:   alert and oriented x3.  CN II-XII grossly intact.   Assessment and Plan:

## 2011-02-06 ENCOUNTER — Encounter: Payer: Self-pay | Admitting: Internal Medicine

## 2011-02-06 ENCOUNTER — Ambulatory Visit (INDEPENDENT_AMBULATORY_CARE_PROVIDER_SITE_OTHER): Payer: BC Managed Care – PPO | Admitting: Internal Medicine

## 2011-02-06 DIAGNOSIS — Z950 Presence of cardiac pacemaker: Secondary | ICD-10-CM

## 2011-02-06 DIAGNOSIS — I4891 Unspecified atrial fibrillation: Secondary | ICD-10-CM

## 2011-02-06 DIAGNOSIS — I498 Other specified cardiac arrhythmias: Secondary | ICD-10-CM

## 2011-02-06 NOTE — Assessment & Plan Note (Signed)
She has had a few episodes detected by her device; however, overall she is much improved

## 2011-02-06 NOTE — Progress Notes (Signed)
HPI  [name] is a 55 y.o. 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 She also had a history of exercise induced tachycardia which was limited and in [place] was given a recommendation to try dronaderone which she failed to tolerate. Also at the recommendation of Dr. Berneice Gandy she underwent device generator replacement received a Biotronik system with CLS. Since that time, she has had significantly fewer nocturnal palpitations, fewer episodes of lightheadedness and tachycardia palpitations.  She recalls that there is a problem related to erythematous urticarial rash following the device implantation which was potentially treated we'll to vancomycin. I had to admit that I do not recall this.  She is doing quite well apart from fatigue which she attributes to the [facility]     Past Medical History  Diagnosis Date  . Cancer     melanoma of skin  . Arm pain, left   . 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 Diverticulitis   . Hypoglycemia, unspecified   . Other nonspecific abnormal serum enzyme levels   . Cervicalgia   . Disturbance of skin sensation   . Other specified congenital anomalies of nervous system   . Swelling of limb   . Disorder of bone and cartilage, unspecified   . Atrial tachycardia   . CVA (cerebral infarction)     2012 with dizziness and vision change felt embolic from atrial tachy  . History of pacemaker     Past Surgical History  Procedure Date  . Insert / replace / remove pacemaker     1999  . Ablation saphenous vein w/ rfa     2002  . Tonsillectomy and adenoidectomy   . Carpal tunnel release   . Nose surgery     Current Outpatient Prescriptions  Medication Sig Dispense Refill  . buPROPion (WELLBUTRIN XL) 150 MG 24 hr tablet Take 150 mg by mouth daily.        . Cholecalciferol (VITAMIN D3) 50000 UNITS CAPS Take by mouth daily. 3 x weekly      . cloNIDine (CATAPRES) 0.1 MG tablet  Take 1 tablet (0.1 mg total) by mouth 2 (two) times daily.  180 tablet  3  . Cyanocobalamin (VITAMIN B-12 IJ) Inject as directed every 30 (thirty) days.        Marland Kitchen labetalol (NORMODYNE) 200 MG tablet Take 200 mg by mouth 2 (two) times daily.        Marland Kitchen LORazepam (ATIVAN) 0.5 MG tablet Take 0.5 mg by mouth every 8 (eight) hours. 1/2 tablet bid       . metoprolol (LOPRESSOR) 100 MG tablet Take 1 tablet (100 mg total) by mouth 2 (two) times daily.      . ondansetron (ZOFRAN) 8 MG tablet Take by mouth every 8 (eight) hours as needed.       . pantoprazole (PROTONIX) 40 MG tablet Take 40 mg by mouth as needed.       Marland Kitchen PHENobarbital (LUMINAL) 64.8 MG tablet Take 1 tablet (64.8 mg total) by mouth at bedtime.  90 tablet  3  . polyethylene glycol powder (GLYCOLAX/MIRALAX) powder DISSOLVE 1/2 CAPFUL IN AT LEAST 8 OUNCES OF WATER OR JUICE ONCE DAILY.  1581 g  0  . potassium chloride SA (K-DUR,KLOR-CON) 20 MEQ tablet 20 mEq. 4 by mouth once daily       . Rivaroxaban 20 MG TABS Take 1 tablet by mouth daily.  90 tablet  3  .  simvastatin (ZOCOR) 10 MG tablet Take 1 tablet (10 mg total) by mouth every evening.  30 tablet  11  . valACYclovir (VALTREX) 1000 MG tablet Take 1 tablet (1,000 mg total) by mouth 3 (three) times daily.  21 tablet  0  . traMADol (ULTRAM) 50 MG tablet Take 1 tablet (50 mg total) by mouth every 8 (eight) hours as needed for pain. Maximum dose= 8 tablets per day  20 tablet  0    Allergies  Allergen Reactions  . Doxycycline   . Erythromycin   . Iohexol      Desc: pt. states allergic to dye,-did fine with pre-meds. 02/13/05 06/14/10...pt did not state allergy to contrast, no premeds given, pt tolerated contrast.   . Penicillins   . Sulfonamide Derivatives   . Vancomycin Rash    Review of Systems negative except from HPI and PMH  Physical Exam Well developed and well nourished in no acute distress HENT normal E scleral and icterus clear Neck Supple JVP flat; carotids brisk and  full Clear to ausculation Regular rate and rhtyhm no murmurs gallops or rub Soft with active bowel sounds No clubbing cyanosis none Edema Alert and oriented, grossly normal motor and sensory function Skin Warm and Dry    Assessment and  Plan

## 2011-02-06 NOTE — Assessment & Plan Note (Signed)
The patient's device was interrogated.  The information was reviewed. No changes were made in the programming.   As noted there just occasional atrial tachycardia

## 2011-02-06 NOTE — Patient Instructions (Signed)
Your physician wants you to follow-up in: 9 months. You will receive a reminder letter in the mail two months in advance. If you don't receive a letter, please call our office to schedule the follow-up appointment.  Your physician recommends that you continue on your current medications as directed. Please refer to the Current Medication list given to you today.

## 2011-02-07 ENCOUNTER — Telehealth: Payer: Self-pay | Admitting: Internal Medicine

## 2011-02-07 LAB — PACEMAKER DEVICE OBSERVATION
AL AMPLITUDE: 6.2 mv
AL IMPEDENCE PM: 390 Ohm
AL THRESHOLD: 1 V
ATRIAL PACING PM: 72
BAMS-0001: 160 {beats}/min
BAMS-0002: 4 ms
DEVICE MODEL PM: 66195584
RV LEAD AMPLITUDE: 6.9 mv
RV LEAD IMPEDENCE PM: 487 Ohm
RV LEAD THRESHOLD: 2.6 V
VENTRICULAR PACING PM: 0

## 2011-02-07 NOTE — Telephone Encounter (Signed)
New problem:  Vitamin B12 is on back order. pls advise

## 2011-02-07 NOTE — Telephone Encounter (Signed)
Coretta at [facility] called,B12 on backorder.Advised to call PCP.

## 2011-02-12 ENCOUNTER — Telehealth: Payer: Self-pay | Admitting: Internal Medicine

## 2011-02-12 ENCOUNTER — Telehealth: Payer: Self-pay | Admitting: *Deleted

## 2011-02-12 NOTE — Telephone Encounter (Signed)
LMOM to call back concerning B12 injections.

## 2011-02-12 NOTE — Telephone Encounter (Signed)
[facility] spoke with [facility] and was advised that B12 is on backorder and I need to call in to a local pharmacy. [facility] did renew the MD order for B12 1 time per month IM for 1 year. Called [facility] on [address] and they have a 30cc vial of B12 in stock 1064m IM 1 time per month. Milas Gain will last 10 months. Patient aware that she needs to pick up medication from [facility].

## 2011-02-12 NOTE — Telephone Encounter (Signed)
New msg [facility] wants to know about syringes for b-12 injections please call them back

## 2011-02-13 NOTE — Telephone Encounter (Signed)
Called [facility] and advised that patient does not need syringes because she receives the injection from [facility] and her home nurse will administer to her with her own supply.

## 2011-02-16 ENCOUNTER — Other Ambulatory Visit: Payer: Self-pay | Admitting: Internal Medicine

## 2011-02-25 ENCOUNTER — Encounter: Payer: Self-pay | Admitting: Internal Medicine

## 2011-02-25 DIAGNOSIS — G901 Familial dysautonomia [Riley-Day]: Secondary | ICD-10-CM | POA: Insufficient documentation

## 2011-02-25 NOTE — Assessment & Plan Note (Signed)
Continue on Xarelto. 

## 2011-02-25 NOTE — Assessment & Plan Note (Signed)
Patient is doing better with the change in her generator.  She is still fatigued.   I would not change her medicines.  Encouraged her to stay activie as able.  Continue fluids.

## 2011-02-25 NOTE — Assessment & Plan Note (Signed)
Symptoms have improved with change in pacer.  She is due to have it interrogated.

## 2011-03-13 ENCOUNTER — Encounter: Payer: Self-pay | Admitting: Internal Medicine

## 2011-03-29 ENCOUNTER — Other Ambulatory Visit: Payer: Self-pay | Admitting: *Deleted

## 2011-03-29 DIAGNOSIS — E782 Mixed hyperlipidemia: Secondary | ICD-10-CM

## 2011-03-29 MED ORDER — SIMVASTATIN 10 MG PO TABS: 10.0000 mg | ORAL_TABLET | Freq: Every evening | ORAL | Status: DC

## 2011-05-08 ENCOUNTER — Other Ambulatory Visit: Payer: Self-pay

## 2011-05-08 MED ORDER — CLONAZEPAM 0.5 MG PO TABS
0.5000 mg | ORAL_TABLET | Freq: Two times a day (BID) | ORAL | Status: DC | PRN
Start: 2011-05-08 — End: 2011-05-28

## 2011-05-08 NOTE — Telephone Encounter (Signed)
..   Requested Prescriptions   Signed Prescriptions Disp Refills  . clonazePAM (KLONOPIN) 0.5 MG tablet 30 tablet 5    Sig: Take 1 tablet (0.5 mg total) by mouth 2 (two) times daily as needed for anxiety.    Authorizing Provider: Pricilla Riffle    Ordering User: Lacie Scotts   Per Annice Pih verbal to refill for 1 yr.;this level of controlled substance can only be refilled 5 refills at a time.

## 2011-05-23 ENCOUNTER — Telehealth: Payer: Self-pay | Admitting: *Deleted

## 2011-05-23 DIAGNOSIS — E7800 Pure hypercholesterolemia, unspecified: Secondary | ICD-10-CM

## 2011-05-23 DIAGNOSIS — M79606 Pain in leg, unspecified: Secondary | ICD-10-CM

## 2011-05-23 NOTE — Telephone Encounter (Signed)
Patient called complaining of lower extremity pains and swelling like she had when taking Zocor in the past. Dr.Ross had her Zocor on hold and she felt better but Dr.Grubb restarted the medication because he felt she needed it post stroke. Dr.Ross ordered labs to be done tomorrow. Lipid Ast Ck. Will call patient back in follow up after review of lab work.

## 2011-05-24 ENCOUNTER — Other Ambulatory Visit (INDEPENDENT_AMBULATORY_CARE_PROVIDER_SITE_OTHER): Payer: BC Managed Care – PPO

## 2011-05-24 DIAGNOSIS — M79606 Pain in leg, unspecified: Secondary | ICD-10-CM

## 2011-05-24 DIAGNOSIS — E7800 Pure hypercholesterolemia, unspecified: Secondary | ICD-10-CM

## 2011-05-24 DIAGNOSIS — M79609 Pain in unspecified limb: Secondary | ICD-10-CM

## 2011-05-24 LAB — BASIC METABOLIC PANEL
BUN: 18 mg/dL (ref 6–23)
CO2: 26 mEq/L (ref 19–32)
Calcium: 9.3 mg/dL (ref 8.4–10.5)
Chloride: 100 mEq/L (ref 96–112)
Creatinine, Ser: 0.8 mg/dL (ref 0.4–1.2)
GFR: 85.78 mL/min (ref >60.00)
Glucose, Bld: 87 mg/dL (ref 70–99)
Potassium: 4.5 mEq/L (ref 3.5–5.1)
Sodium: 136 mEq/L (ref 135–145)

## 2011-05-24 LAB — AST: AST: 25 U/L (ref 0–37)

## 2011-05-24 LAB — LIPID PANEL
Cholesterol: 196 mg/dL (ref 0–200)
HDL: 65.7 mg/dL (ref >39.00)
LDL Cholesterol: 104 mg/dL — ABNORMAL HIGH (ref 0–99)
Total CHOL/HDL Ratio: 3
Triglycerides: 133 mg/dL (ref 0.0–149.0)
VLDL: 26.6 mg/dL (ref 0.0–40.0)

## 2011-05-24 LAB — CK: Total CK: 117 U/L (ref 7–177)

## 2011-05-28 ENCOUNTER — Telehealth: Payer: Self-pay | Admitting: Internal Medicine

## 2011-05-28 ENCOUNTER — Other Ambulatory Visit: Payer: Self-pay | Admitting: Internal Medicine

## 2011-05-28 DIAGNOSIS — E7800 Pure hypercholesterolemia, unspecified: Secondary | ICD-10-CM

## 2011-05-28 MED ORDER — CLONAZEPAM 0.5 MG PO TABS: 0.5000 mg | ORAL_TABLET | Freq: Two times a day (BID) | ORAL | Status: DC | PRN

## 2011-05-28 NOTE — Telephone Encounter (Signed)
Per pt call - joint aches are better since being off of simvastatin.  Wants to know what medication she needs to be on now.  Also needs her RX for clonazepam sent into pharmacy.

## 2011-05-28 NOTE — Telephone Encounter (Signed)
Patient reports that she has recently started back with her exercising program. She is having problems with hemorrhoids since starting the exercising. She has tried the OTC medications but they are not helping. She is on Xerlto and when the hemorrhoids start bleeding it is difficult to get stopped. She is doing sitz baths daily but cannot do them more frequently due to heart problems. She would like rx for hemorrhoids is possible. Hx diverticulosis, constipations POTS syndrome. Last colon June 2008.

## 2011-05-28 NOTE — Telephone Encounter (Signed)
Please send Anusol HC supp, #12, 1 qhs,x 12, then prn, 2 refills, Also, Analpram cream 2.5 % apply bid prn external hems. 1 refill 15 gm

## 2011-05-28 NOTE — Telephone Encounter (Signed)
New problem  Patient calling, patient was taken off medication -simvastatin. Was told to stay off for 5 day. Dr. Tenny Craw would switch to a different drug. Her pain level went away./  Need a clonazepam  called in.

## 2011-05-29 MED ORDER — HYDROCORTISONE ACETATE 25 MG RE SUPP: RECTAL | Status: DC

## 2011-05-29 MED ORDER — HYDROCORTISONE ACE-PRAMOXINE 2.5-1 % RE CREA
TOPICAL_CREAM | RECTAL | Status: DC
Start: 2011-05-29 — End: 2012-09-22

## 2011-05-29 NOTE — Telephone Encounter (Signed)
Rx sent to pharmacy. Left a message for patient to call me. 

## 2011-05-29 NOTE — Telephone Encounter (Signed)
Spoke with patient and gave her Dr. Brodie's recommendations. 

## 2011-06-07 NOTE — Telephone Encounter (Signed)
Called patient in follow up. She continues to be pain free in her legs OFF simvastatin. Advised to continue to hold and have repeat fasting lab work in June. Set up for fasting labs first week of June.

## 2011-06-08 ENCOUNTER — Other Ambulatory Visit: Payer: Self-pay | Admitting: Internal Medicine

## 2011-06-29 ENCOUNTER — Ambulatory Visit: Payer: BC Managed Care – PPO | Attending: Neurology | Admitting: Physical Therapy

## 2011-06-29 DIAGNOSIS — I69998 Other sequelae following unspecified cerebrovascular disease: Secondary | ICD-10-CM | POA: Insufficient documentation

## 2011-06-29 DIAGNOSIS — IMO0001 Reserved for inherently not codable concepts without codable children: Secondary | ICD-10-CM | POA: Insufficient documentation

## 2011-06-29 DIAGNOSIS — M6281 Muscle weakness (generalized): Secondary | ICD-10-CM | POA: Insufficient documentation

## 2011-06-29 DIAGNOSIS — R269 Unspecified abnormalities of gait and mobility: Secondary | ICD-10-CM | POA: Insufficient documentation

## 2011-07-03 ENCOUNTER — Ambulatory Visit: Payer: BC Managed Care – PPO | Admitting: *Deleted

## 2011-07-06 ENCOUNTER — Telehealth: Payer: Self-pay | Admitting: *Deleted

## 2011-07-06 ENCOUNTER — Ambulatory Visit: Payer: BC Managed Care – PPO | Admitting: Physical Therapy

## 2011-07-06 NOTE — Telephone Encounter (Signed)
The patient called today stating that she has noticed some discomfort to her feet and ankles, as well as her veins "popping" out on her feet since yesterday. She wanted to know what the cause of this would be. She reports only minimal edema to her lower extremities. She denies any coldness or temperature change to her feet. I explained to the patient that I am not sure what the cause of her symptoms is. She reports that she had some neurologic testing done with Dr. Sandria Manly and has started neuro rehab. She states that this started this week and she had to put her legs in the air and stand up, this did aggravate her dysautonomia. I explained that she may be having some problems with her venous return and that this could be what she is noticing. She thinks that the pain in her ankles may be coming from where she fell during neuro rehab. I advised her to observe her symptoms and call if things get worse. She is agreeable.

## 2011-07-09 ENCOUNTER — Ambulatory Visit: Payer: BC Managed Care – PPO | Admitting: Physical Therapy

## 2011-07-12 ENCOUNTER — Ambulatory Visit: Payer: BC Managed Care – PPO | Admitting: Physical Therapy

## 2011-07-17 ENCOUNTER — Ambulatory Visit: Payer: BC Managed Care – PPO | Admitting: Physical Therapy

## 2011-07-18 ENCOUNTER — Ambulatory Visit: Payer: BC Managed Care – PPO | Admitting: Physical Therapy

## 2011-07-20 ENCOUNTER — Other Ambulatory Visit: Payer: Self-pay | Admitting: *Deleted

## 2011-07-20 MED ORDER — PHENOBARBITAL 64.8 MG PO TABS: 64.8000 mg | ORAL_TABLET | Freq: Every day | ORAL | Status: DC

## 2011-07-24 ENCOUNTER — Telehealth: Payer: Self-pay | Admitting: *Deleted

## 2011-07-24 ENCOUNTER — Telehealth: Payer: Self-pay | Admitting: Internal Medicine

## 2011-07-24 ENCOUNTER — Encounter: Payer: Self-pay | Admitting: *Deleted

## 2011-07-24 NOTE — Telephone Encounter (Signed)
Spoke with patient and she states she ate some strawberries last week and has had a diverticulitis flare. States last night she had left lower abdominal pain, nausea and diarrhea. She has been on clear liquids today. She had some Cipro  and she started it this AM. She wants to be seen by Dr, Juanda Chance only. Scheduled patient with Dr. Juanda Chance on 07/25/11 at 1:30 PM. She will continue the clear liquids and Cipro until her visit.

## 2011-07-24 NOTE — Telephone Encounter (Signed)
Send Rx to her pharmacy

## 2011-07-24 NOTE — Telephone Encounter (Signed)
Patient called to report that she saw Dr.Grubb in [place], [place] last week. Her BP is running high at 152/110. He is requesting that she stop Metoprolol and start Bystolic 20mg  every day. Will let Dr.Ross know. Samples left for patient to pick up.

## 2011-07-25 ENCOUNTER — Ambulatory Visit (INDEPENDENT_AMBULATORY_CARE_PROVIDER_SITE_OTHER)
Admission: RE | Admit: 2011-07-25 | Discharge: 2011-07-25 | Disposition: A | Discharge status: Home / Self Care | Payer: BC Managed Care – PPO | Source: Ambulatory Visit | Attending: Internal Medicine | Admitting: Internal Medicine

## 2011-07-25 ENCOUNTER — Ambulatory Visit: Payer: BC Managed Care – PPO | Admitting: Physical Therapy

## 2011-07-25 ENCOUNTER — Encounter: Payer: Self-pay | Admitting: Internal Medicine

## 2011-07-25 ENCOUNTER — Ambulatory Visit (INDEPENDENT_AMBULATORY_CARE_PROVIDER_SITE_OTHER): Payer: BC Managed Care – PPO | Admitting: Internal Medicine

## 2011-07-25 VITALS — BP 100/70 | HR 64 | Ht 63.0 in | Wt 158.0 lb

## 2011-07-25 DIAGNOSIS — K5732 Diverticulitis of large intestine without perforation or abscess without bleeding: Secondary | ICD-10-CM

## 2011-07-25 DIAGNOSIS — K5792 Diverticulitis of intestine, part unspecified, without perforation or abscess without bleeding: Secondary | ICD-10-CM

## 2011-07-25 DIAGNOSIS — R1032 Left lower quadrant pain: Secondary | ICD-10-CM

## 2011-07-25 MED ORDER — METRONIDAZOLE 250 MG PO TABS: 250.0000 mg | ORAL_TABLET | Freq: Three times a day (TID) | ORAL | Status: AC

## 2011-07-25 MED ORDER — CIPROFLOXACIN HCL 500 MG PO TABS: 500.0000 mg | ORAL_TABLET | Freq: Two times a day (BID) | ORAL | Status: DC

## 2011-07-25 NOTE — Patient Instructions (Addendum)
We have sent the following medications to your pharmacy for you to pick up at your convenience: Cipro Flagyl  You have been scheduled for a CT scan of the abdomen and pelvis at [facility] ([address]---this is in the same building as [facility]).   You are scheduled on TODAY. You should arrive 15 minutes prior to your appointment time for registration. Please follow the written instructions below on the day of your exam:  WARNING: IF YOU ARE ALLERGIC TO IODINE/X-RAY DYE, PLEASE NOTIFY RADIOLOGY IMMEDIATELY AT [phone]! YOU WILL BE GIVEN A 13 HOUR PREMEDICATION PREP.  1) You have been given 2 bottles of oral contrast to drink. The solution may taste better if refrigerated, but do NOT add ice or any other liquid to this solution. Shake well before drinking.    Drink 1 bottle of contrast @ 2:00 pm (2 hours prior to your exam)  Drink 1 bottle of contrast @ 3:00 pm (1 hour prior to your exam)  You may take any medications as prescribed with a small amount of water except for the following: Metformin, Glucophage, Glucovance, Avandamet, Riomet, Fortamet, Actoplus Met, Janumet, Glumetza or Metaglip. The above medications must be held the day of the exam AND 48 hours after the exam.  The purpose of you drinking the oral contrast is to aid in the visualization of your intestinal tract. The contrast solution may cause some diarrhea. Before your exam is started, you will be given a small amount of fluid to drink. Depending on your individual set of symptoms, you may also receive an intravenous injection of x-ray contrast/dye. Plan on being at [facility] for 30 minutes or long, depending on the type of exam you are having performed.  If you have any questions regarding your exam or if you need to reschedule, you may call the CT department at [phone] between the hours of 8:00 am and 5:00 pm,  Monday-Friday.  ________________________________________________________________________ Low Fiber and Residue Restricted Diet A low fiber diet restricts foods that contain carbohydrates that are not digested in the small intestine. A diet containing about 10 g of fiber is considered low fiber. The diet needs to be individualized to suit patient tolerances and preferences and to avoid unnecessary restrictions. Generally, the foods emphasized in a low fiber diet have no skins or seeds. They may have been processed to remove bran, germ, or husks. Cooking may not necessarily eliminate the fiber. Cooking may, in fact, enable a greater quantity of fiber to be consumed in a lesser volume. Legumes and nuts are also restricted. The term low residue has also been used to describe low fiber diets, although the two are not the same. Residue refers to any substance that adds to bowel (colonic) contents, such as sloughed cells and intestinal bacteria, in addition to fiber. Residue-containing foods, prunes and prune juice, milk, and connective tissue from meats may also need to be eliminated. It is important to eliminate these foods during sudden (acute) attacks of inflammatory bowel disease, when there is a partial obstruction due to another reason, or when minimal fecal output is desired. When these problems are gone, a more normal diet may be used. PURPOSE  Prevent blockage of a partially obstructed or narrowed gastrointestinal tract.   Reduce stool weight and volume.   Slow the movement of waste.  WHEN IS THIS DIET USED?  Acute phase of Crohn's disease, ulcerative colitis, regional enteritis, or diverticulitis.   Narrowing (stenosis) of intestinal or esophageal tubes (lumina).  Transitional diet following surgery, injury (trauma), or illness.  ADEQUACY This diet is nutritionally adequate based on individual food choices according to the Recommended Dietary Allowances of the [employer]. CHOOSING FOODS Check labels, especially on foods from the starch list. Often, dietary fiber content is listed with the Nutrition Facts panel.  Breads and Starches  Allowed: White, [place], and pita breads, plain rolls, buns, or sweet rolls, doughnuts, waffles, pancakes, bagels. Plain muffins, sweet breads, biscuits, matzoth. Flour. Soda, saltine, or graham crackers. Pretzels, rusks, melba toast, zwieback. Cooked cereals: cornmeal, farina, cream cereals. Dry cereals: refined corn, wheat, rice, and oat cereals (check label). Potatoes prepared any way without skins, refined macaroni, spaghetti, noodles, refined rice.   Avoid: Bread, rolls, or crackers made with whole-wheat, multigrains, rye, bran seeds, nuts, or coconut. Corn tortillas, table-shells. Corn chips, tortilla chips. Cereals containing whole-grains, multigrains, bran, coconut, nuts, or raisins. Cooked or dry oatmeal. Coarse wheat cereals, granola. Cereals advertised as "high fiber." Potato skins. Whole-grain pasta, wild or brown rice. Popcorn.  Vegetables  Allowed:  Strained tomato and vegetable juices. Fresh: tender lettuce, cucumber, cabbage, spinach, bean sprouts. Cooked, canned: asparagus, bean sprouts, cut green or wax beans, cauliflower, pumpkin, beets, mushrooms, olives, spinach, yellow squash, tomato, tomato sauce (no seeds), zucchini (peeled), turnips. Canned sweet potatoes. Small amounts of celery, onion, radish, and green pepper may be used. Keep servings limited to  cup.   Avoid: Fresh, cooked, or canned: artichokes, baked beans, beet greens, broccoli, Brussels sprouts, French-style green beans, corn, kale, legumes, peas, sweet potatoes. Cooked: green or red cabbage, spinach. Avoid large servings of any vegetables.  Fruit  Allowed:  All fruit juices except prune juice. Cooked or canned: apricots applesauce, cantaloupe, cherries, grapefruit, grapes, kiwi, mandarin oranges, peaches, pears, fruit cocktail, pineapple, plums,  watermelon. Fresh: banana, grapes, cantaloupe, avocado, cherries, pineapple, grapefruit, kiwi, nectarines, peaches, oranges, blueberries, plums. Keep servings limited to  cup or 1 piece.   Avoid: Fresh: apple with or without skin, apricots, mango, pears, raspberries, strawberries. Prune juice, stewed or dried prunes. Dried fruits, raisins, dates. Avoid large servings of all fresh fruits.  Meat and Meat Substitutes  Allowed:  Ground or well-cooked tender beef, ham, veal, lamb, pork, or poultry. Eggs, plain cheese. Fish, oysters, shrimp, lobster, other seafood. Liver, organ meats.   Avoid: Tough, fibrous meats with gristle. Peanut butter, smooth or chunky. Cheese with seeds, nuts, or other foods not allowed. Nuts, seeds, legumes, dried peas, beans, lentils.  Milk  Allowed:  All milk products except those not allowed. Milk and milk product consumption should be minimal when low residue is desired.   Avoid: Yogurt that contains nuts or seeds.  Soups and Combination Foods  Allowed:  Bouillon, broth, or cream soups made from allowed foods. Any strained soup. Casseroles or mixed dishes made with allowed foods.   Avoid: Soups made from vegetables that are not allowed or that contain other foods not allowed.  Desserts and Sweets  Allowed:  Plain cakes and cookies, pie made with allowed fruit, pudding, custard, cream pie. Gelatin, fruit, ice, sherbet, frozen ice pops. Ice cream, ice milk without nuts. Plain hard candy, honey, jelly, molasses, syrup, sugar, chocolate syrup, gumdrops, marshmallows.   Avoid: Desserts, cookies, or candies that contain nuts, peanut butter, or dried fruits. Jams, preserves with seeds, marmalade.  Fats and Oils  Allowed:  Margarine, butter, cream, mayonnaise, salad oils, plain salad dressings made from allowed foods. Plain gravy, crisp bacon without rind.   Avoid: Seeds, nuts, olives. Avocados.  Beverages  Allowed:  All, except those listed to avoid.   Avoid: Fruit  juices with high pulp, prune juice.  Condiments  Allowed:  Ketchup, mustard, horseradish, vinegar, cream sauce, cheese sauce, cocoa powder. Spices in moderation: allspice, basil, bay leaves, celery powder or leaves, cinnamon, cumin powder, curry powder, ginger, mace, marjoram, onion or garlic powder, oregano, paprika, parsley flakes, ground pepper, rosemary, sage, savory, tarragon, thyme, turmeric.   Avoid: Coconut, pickles.  SAMPLE MEAL PLAN The following menu is provided as a sample. Your daily menu plans will vary. Be sure to include a minimum of the following each day in order to provide essential nutrients for the adult:  Starch/Bread/Cereal Group, 6 servings.   Fruit/Vegetable Group, 5 servings.   Meat/Meat Substitute Group, 2 servings.   Milk/Milk Substitute Group, 2 servings.  A serving is equal to  cup for fruits, vegetables, and cooked cereals or 1 piece for foods such as a piece of bread, 1 orange, or 1 apple. For dry cereals and crackers, use serving sizes listed on the label. Combination foods may count as full or partial servings from various food groups. Fats, desserts, and sweets may be added to the meal plan after the requirements for essential nutrients are met. SAMPLE MENU Breakfast   cup orange juice.   1 boiled egg.   1 slice white toast.   Margarine.    cup cornflakes.   1 cup milk.   Beverage.  Lunch   cup chicken noodle soup.   2 to 3 oz sliced roast beef.   2 slices seedless rye bread.   Mayonnaise.    cup tomato juice.   1 small banana.   Beverage.  Dinner  3 oz baked chicken.    cup scalloped potatoes.    cup cooked beets.   White dinner roll.   Margarine.    cup canned peaches.   Beverage.  Document Released: 08/04/2001 Document Revised: 02/01/2011 Document Reviewed: 01/15/2011 [facility] Patient Information 2012 [place], [place].  CC: Dr Berniece Andreas

## 2011-07-25 NOTE — Progress Notes (Signed)
[name] [birth year] MRN [id number]   History of Present Illness:  This is a 56 year old white female with symptomatic sigmoid diverticulosis and a recent flareup of left lower quadrant abdominal pain. 5 days ago, she developed chills and severe abdominal pain followed by diarrhea. She denies any rectal bleeding but does have rectal irritation from diarrhea. She has a history of diverticulitis in January 2011. A CT scan of the abdomen at that time did not show any inflammatory changes. A flexible sigmoidoscopy confirmed the presence of diverticulosis. Her last full colonoscopy was in June 2008 which showed moderately severe diverticulosis. 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 Her pacemaker interrogation revealed that she was in paroxysmal atrial fibrillation prior to the CVA. She has been followed by Dr. Sandria Manly. She is unable to tolerate IV contrast and various medications including antispasmodics. Her diverticulitis started while visiting Dr Berneice Gandy in [place] for a yearly follow up of her POT's syndrome.   Past Medical History  Diagnosis Date  . 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 Hypoglycemia, unspecified   . Other nonspecific abnormal serum enzyme levels   . Cervicalgia   . CVA, old, alterations of sensations   . Other specified congenital anomalies of nervous system   . Swelling of limb   . Disorder of bone and cartilage, unspecified   . Atrial tachycardia   . CVA (cerebral infarction)     2012 with dizziness and vision change felt embolic from atrial tachy  . History of pacemaker   . POTS (postural orthostatic tachycardia syndrome)   . Diverticulosis   . Skin cancer   . Osteopenia   . DVT (deep venous thrombosis)   . Atrial fibrillation   . DM (diabetes mellitus)   . DVT (deep venous thrombosis)   . HLD (hyperlipidemia)   . HTN (hypertension)    Past Surgical History  Procedure Date  . Insert  / replace / remove pacemaker 2012    1999, x 3  . Ablation saphenous vein w/ rfa     2002  . Tonsillectomy and adenoidectomy   . Carpal tunnel release     left  . Nose surgery 2010, 2012    for nose bleeds x 2  . Fatty tumor removed     from chest  . Melanoma excision     with removal of lymph nodes, left shoulder    reports that she has never smoked. She has never used smokeless tobacco. She reports that she drinks alcohol. She reports that she does not use illicit drugs. family history includes Clotting disorder in her maternal grandmother; Colon polyps in her mother; Crohn's disease in her maternal grandmother; Diabetes in her father, maternal grandmother, and paternal grandmother; Heart disease in her father; Kidney disease in her father; and Stroke in her mother. Allergies  Allergen Reactions  . Doxycycline   . Erythromycin   . Iohexol      Desc: pt. states allergic to dye,-did fine with pre-meds. 02/13/05 06/14/10...pt did not state allergy to contrast, no premeds given, pt tolerated contrast.   . Penicillins   . Sulfonamide Derivatives   . Vancomycin Rash        Review of Systems:Currently denies any fever. No rectal bleeding. Negative for nausea vomiting   The remainder of the 10 point ROS is negative except as outlined in H&P   Physical Exam: General appearance  Well developed,  in no distress. Eyes- non icteric. HEENT nontraumatic, normocephalic. Mouth no lesions, tongue papillated, no cheilosis. Neck supple without adenopathy, thyroid not enlarged, no carotid bruits, no JVD. Lungs Clear to auscultation bilaterally. Cor normal S1, normal S2, regular rhythm, no murmur,  quiet precordium. Abdomen:  Soft, very tender along left colon. No rebound. No distention. Normal active bowel sounds.  Rectal: normal rectal sphincter tone. Small amount of brown Hemoccult negative stool . Extremities no pedal edema. Skin no lesions. Neurological alert and oriented x  3. Psychological normal mood and affect.  Assessment and Plan:  Problem #1 acute onset of left lower quadrant abdominal pain consistent with recurrent diverticulitis. We will proceed with a CT scan of the abdomen and pelvis with oral contrast only since she is allergic to IV contrast. She will be started on Cipro 500 mg twice a day and Flagyl 250 mg by mouth 3 times a day for 10 days. She will stay on full liquids and a low residue diet. We have discussed the possibility of a need for segmental resection of her colon but she is at a very high risk for complications given her history of vascular problems. At this point, we will continue conservative management. Problem #2 POT's syndrome- followed by our cardiologists and by Dr Rema Jasmine at [facility]  07/25/2011 [name]

## 2011-07-26 ENCOUNTER — Encounter: Payer: Self-pay | Admitting: Internal Medicine

## 2011-07-26 ENCOUNTER — Telehealth: Payer: Self-pay | Admitting: Internal Medicine

## 2011-07-26 NOTE — Telephone Encounter (Signed)
Patient picked up samples yesterday. Dr.Grubb already sent in script. Patient mentioned that she has a new problem that showed up on a CT yesterday. Left ovarian cyst at 8.2 cm. Discussed with Dr.Ross. If needs surgery will have to come off  Xarelto and be bridged with Lovenox injections because of history of stroke. She will let us know and set up with Kennon Rounds pharmD if needed.

## 2011-07-26 NOTE — Telephone Encounter (Signed)
Spoke with patient and she has picked up the CD. Told her I faxed the report to her early today.

## 2011-07-27 ENCOUNTER — Ambulatory Visit: Payer: BC Managed Care – PPO | Admitting: Physical Therapy

## 2011-07-30 ENCOUNTER — Telehealth: Payer: Self-pay | Admitting: Internal Medicine

## 2011-07-30 NOTE — Telephone Encounter (Signed)
Will forward to anticoag clinic

## 2011-07-30 NOTE — Telephone Encounter (Signed)
Pt is on Xarelto and needs to have an oopherectomy done.  Dr Algie Coffer is requesting that xarelto be discontinued and pt be started on heparin prior to the surgery.  She is requesting that Dr Tenny Craw manage the heparin.  Surgery will not be scheduled until pt is on heparin per Dr Roseanne Kaufman nurse.

## 2011-07-30 NOTE — Telephone Encounter (Signed)
They have a question about the pt coumadin and she is having a laparoscopic hysterectomy and it will be heprin window and wants to discuss this

## 2011-07-31 ENCOUNTER — Other Ambulatory Visit: Payer: BC Managed Care – PPO

## 2011-07-31 NOTE — Telephone Encounter (Signed)
Attempted to reach [facility] at Dr. Roseanne Kaufman office.  LM for her to call back regarding anticoag

## 2011-07-31 NOTE — Telephone Encounter (Signed)
Spoke with [employer].  Discussed pharmacokinetics of Xarelto.  Pt will only need to hold Xarelto for 24 hours prior to procedure and may resume as soon as hemostasis is obtained with no need for heparin bridge.  She will relay this information to Dr. Algie Coffer and will let us know the date of the procedure so we can instruct patient.

## 2011-08-01 ENCOUNTER — Encounter: Payer: Self-pay | Admitting: Internal Medicine

## 2011-08-01 ENCOUNTER — Telehealth: Payer: Self-pay | Admitting: *Deleted

## 2011-08-01 NOTE — Telephone Encounter (Signed)
Spoke with patient and Baxter Hire at MD's office.  Pt surgery is set for 6/19.  MD needs written surgical clearance and instructions for anticoagulation.  Will forward to Dr. Tenny Craw.  Fax number: [phone].

## 2011-08-01 NOTE — Telephone Encounter (Signed)
Follow-up:    Patient called in to follow-up to her previous calls.  Please call back.

## 2011-08-01 NOTE — Telephone Encounter (Signed)
Pt calling stating she is having a tumor removed from ovary/intestine and needs surgical clearance--she has already spoken to sally putt about her blood thinner and has that settled, but now she needs clearance  For procedure and recent EKG--advised i will try to get dr Tenny Craw to write clearance and we may use EKG from 11/12--pt agrees--she has appoint with anesthesiologist 6/13 and wants to take all of the above to that appoint--will forward message to dr Tenny Craw

## 2011-08-02 ENCOUNTER — Encounter (HOSPITAL_COMMUNITY): Payer: Self-pay | Admitting: Pharmacist

## 2011-08-02 NOTE — Telephone Encounter (Signed)
Spoke with Patient today. She said that she has spoken with Baldemar Friday RN, yesterday and every thing is taken care off.

## 2011-08-03 ENCOUNTER — Telehealth: Payer: Self-pay | Admitting: *Deleted

## 2011-08-03 NOTE — Telephone Encounter (Signed)
Most recent EKG and letter describing present heart condition faxed to [facility] and pt states she has received it

## 2011-08-07 ENCOUNTER — Other Ambulatory Visit: Payer: Self-pay | Admitting: Obstetrics

## 2011-08-08 ENCOUNTER — Other Ambulatory Visit: Payer: Self-pay | Admitting: Internal Medicine

## 2011-08-09 ENCOUNTER — Encounter (HOSPITAL_COMMUNITY): Payer: Self-pay

## 2011-08-09 ENCOUNTER — Encounter (HOSPITAL_COMMUNITY)
Admission: RE | Admit: 2011-08-09 | Discharge: 2011-08-09 | Disposition: A | Discharge status: Home / Self Care | Payer: BC Managed Care – PPO | Source: Ambulatory Visit | Attending: Obstetrics | Admitting: Obstetrics

## 2011-08-09 DIAGNOSIS — Z01812 Encounter for preprocedural laboratory examination: Secondary | ICD-10-CM | POA: Insufficient documentation

## 2011-08-09 HISTORY — DX: Other complications of anesthesia, initial encounter: T88.59XA

## 2011-08-09 HISTORY — DX: Presence of cardiac pacemaker: Z95.0

## 2011-08-09 HISTORY — DX: Adverse effect of unspecified anesthetic, initial encounter: T41.45XA

## 2011-08-09 LAB — BASIC METABOLIC PANEL
BUN: 13 mg/dL (ref 6–23)
CO2: 28 mEq/L (ref 19–32)
Calcium: 9.4 mg/dL (ref 8.4–10.5)
Chloride: 99 mEq/L (ref 96–112)
Creatinine, Ser: 0.61 mg/dL (ref 0.50–1.10)
GFR calc Af Amer: >90 mL/min (ref >90)
GFR calc non Af Amer: >90 mL/min (ref >90)
Glucose, Bld: 96 mg/dL (ref 70–99)
Potassium: 3.9 mEq/L (ref 3.5–5.1)
Sodium: 137 mEq/L (ref 135–145)

## 2011-08-09 LAB — CBC
HCT: 42.9 % (ref 36.0–46.0)
Hemoglobin: 14.6 g/dL (ref 12.0–15.0)
MCH: 30.4 pg (ref 26.0–34.0)
MCHC: 34 g/dL (ref 30.0–36.0)
MCV: 89.4 fL (ref 78.0–100.0)
Platelets: 267 10*3/uL (ref 150–400)
RBC: 4.8 MIL/uL (ref 3.87–5.11)
RDW: 12.3 % (ref 11.5–15.5)
WBC: 5.2 10*3/uL (ref 4.0–10.5)

## 2011-08-09 LAB — SURGICAL PCR SCREEN
MRSA, PCR: NEGATIVE
Staphylococcus aureus: POSITIVE — AB

## 2011-08-09 NOTE — Patient Instructions (Addendum)
20 [name]  08/09/2011   Your procedure is scheduled on:  08/15/11  Enter through the Main Entrance of [facility] at 7 AM.  Pick up the phone at the desk and dial [phone].   Call this number if you have problems the morning of surgery: [phone]   Remember:   Do not eat food:After Midnight.  Do not drink clear liquids: After Midnight.  Take these medicines the morning of surgery with A SIP OF WATER: Blood pressure medications, anti-anxiety medication   Do not wear jewelry, make-up or nail polish.  Do not wear lotions, powders, or perfumes. You may wear deodorant.  Do not shave 48 hours prior to surgery.  Do not bring valuables to the hospital.  Contacts, dentures or bridgework may not be worn into surgery.  Leave suitcase in the car. After surgery it may be brought to your room.  For patients admitted to the hospital, checkout time is 11:00 AM the day of discharge.   Patients discharged the day of surgery will not be allowed to drive home.  Name and phone number of your driver: NA  Special Instructions: CHG Shower Use Special Wash: 1/2 bottle night before surgery and 1/2 bottle morning of surgery.   Please read over the following fact sheets that you were given: MRSA Information

## 2011-08-09 NOTE — Anesthesia Preprocedure Evaluation (Signed)
Anesthesia Evaluation  Patient identified by MRN, date of birth, ID band Patient awake    Reviewed: Allergy & Precautions, H&P , NPO status , Patient's Chart, lab work & pertinent test results  Airway Mallampati: I TM Distance: >3 FB Neck ROM: full    Dental  (+) Teeth Intact   Pulmonary neg pulmonary ROS,    Pulmonary exam normal       Cardiovascular hypertension, On Home Beta Blockers and On Medications + dysrhythmias (on xarelto - stopping on tuesday prior to surgery, may bridge with lovenox) Atrial Fibrillation + pacemaker (since 1999 (replaced Sept 6, 2012) - placed for autonomic dysfunction and atrial dysrhythmias, pateint reports she is paced about 65% of the time)  Per cardiology, "from a cardiovascular standpoint close attention to hemodynamics will be important.  I would recommend increased hydration prior to the procedure."   Neuro/Psych Seizures - (on phenobarbital for seizure proph related to autonomic dysfunction (one seizure years ago)),  PSYCHIATRIC DISORDERS (anxiety meds since stroke) POTS, autonomic dysfunction CVA (believed to be embolic from AF - initial left sided weakness, vision deficits and cognitive impairments all resolved - Feb 2012), No Residual Symptoms    GI/Hepatic Neg liver ROS, hiatal hernia, GERD-  Medicated,diverticulosis   Endo/Other  Diabetes mellitus- (diet controlled), Well Controlled, Type 2  Renal/GU negative Renal ROS  Female GU complaint     Musculoskeletal   Abdominal   Peds  Hematology DVT left leg   Anesthesia Other Findings See allergy list Cannot use right arm for BP or IV access due to previous melanoma resection/lymph node resection  Reproductive/Obstetrics negative OB ROS                           Anesthesia Physical Anesthesia Plan  ASA: III  Anesthesia Plan: General ETT   Post-op Pain Management:    Induction:   Airway Management  Planned:   Additional Equipment:   Intra-op Plan:   Post-operative Plan:   Informed Consent: I have reviewed the patients History and Physical, chart, labs and discussed the procedure including the risks, benefits and alternatives for the proposed anesthesia with the patient or authorized representative who has indicated his/her understanding and acceptance.   Dental Advisory Given  Plan Discussed with: CRNA and Surgeon  Anesthesia Plan Comments:         Anesthesia Quick Evaluation

## 2011-08-10 ENCOUNTER — Encounter (HOSPITAL_COMMUNITY): Payer: Self-pay

## 2011-08-12 ENCOUNTER — Encounter: Payer: Self-pay | Admitting: Internal Medicine

## 2011-08-12 LAB — PROTIME-INR

## 2011-08-16 NOTE — OR Nursing (Signed)
Shanell  Called and asked to move up Fogleman's case to 11am spoke with Durward Mallard and was told ok as now we are now posting first come first seve

## 2011-09-03 ENCOUNTER — Inpatient Hospital Stay (HOSPITAL_COMMUNITY): Admission: RE | Admit: 2011-09-03 | Payer: BC Managed Care – PPO | Source: Ambulatory Visit

## 2011-09-04 ENCOUNTER — Encounter (HOSPITAL_COMMUNITY): Payer: Self-pay

## 2011-09-04 ENCOUNTER — Encounter (HOSPITAL_COMMUNITY)
Admission: RE | Admit: 2011-09-04 | Discharge: 2011-09-04 | Disposition: A | Discharge status: Home / Self Care | Payer: BC Managed Care – PPO | Source: Ambulatory Visit | Attending: Obstetrics | Admitting: Obstetrics

## 2011-09-04 HISTORY — DX: Nausea with vomiting, unspecified: R11.2

## 2011-09-04 HISTORY — DX: Nausea with vomiting, unspecified: Z98.890

## 2011-09-04 LAB — BASIC METABOLIC PANEL
BUN: 14 mg/dL (ref 6–23)
CO2: 25 mEq/L (ref 19–32)
Calcium: 9.4 mg/dL (ref 8.4–10.5)
Chloride: 103 mEq/L (ref 96–112)
Creatinine, Ser: 0.62 mg/dL (ref 0.50–1.10)
GFR calc Af Amer: >90 mL/min (ref >90)
GFR calc non Af Amer: >90 mL/min (ref >90)
Glucose, Bld: 101 mg/dL — ABNORMAL HIGH (ref 70–99)
Potassium: 3.4 mEq/L — ABNORMAL LOW (ref 3.5–5.1)
Sodium: 140 mEq/L (ref 135–145)

## 2011-09-04 LAB — CBC
HCT: 41.1 % (ref 36.0–46.0)
Hemoglobin: 13.8 g/dL (ref 12.0–15.0)
MCH: 29.8 pg (ref 26.0–34.0)
MCHC: 33.6 g/dL (ref 30.0–36.0)
MCV: 88.8 fL (ref 78.0–100.0)
Platelets: 242 10*3/uL (ref 150–400)
RBC: 4.63 MIL/uL (ref 3.87–5.11)
RDW: 12.6 % (ref 11.5–15.5)
WBC: 5.2 10*3/uL (ref 4.0–10.5)

## 2011-09-04 LAB — SURGICAL PCR SCREEN
MRSA, PCR: NEGATIVE
Staphylococcus aureus: POSITIVE — AB

## 2011-09-04 NOTE — Patient Instructions (Addendum)
YOUR PROCEDURE IS SCHEDULED ON:09/06/11  ENTER THROUGH THE MAIN ENTRANCE OF [facility] AT:0900 am- per anesthesia USE DESK PHONE AND DIAL [phone] TO INFORM us OF YOUR ARRIVAL  CALL [phone] IF YOU HAVE ANY QUESTIONS OR PROBLEMS PRIOR TO YOUR ARRIVAL.  REMEMBER: DO NOT EAT OR DRINK AFTER MIDNIGHT :Wed. Clear liquids ok until 7am on Thursday  YOU MAY BRUSH YOUR TEETH THE MORNING OF SURGERY   TAKE THESE MEDICINES THE DAY OF SURGERY WITH SIP OF WATER: all heart and BP meds, & pantoprazole   DO NOT WEAR JEWELRY, EYE MAKEUP, LIPSTICK OR DARK FINGERNAIL POLISH DO NOT WEAR LOTIONS  DO NOT SHAVE FOR 48 HOURS PRIOR TO SURGERY  YOU WILL NOT BE ALLOWED TO DRIVE YOURSELF HOME.

## 2011-09-04 NOTE — Pre-Procedure Instructions (Signed)
Per pt she cannot have a PICC line because of her pacemaker- Dr. Sherron Ales made aware. Dr. Jean Rosenthal evaluated pt for IV access and located a vein on pt's neck- right side. Dr. Jean Rosenthal wants pt to arrive 30 min early for IV start. I offered to draw T&S today with PAT labs but pt declined to wear lab bracelet until DOS. I was unable to get lab work after one attempt and I had Thomasenia Sales in lab draw pt's labs---successful after 2 attempts.

## 2011-09-06 ENCOUNTER — Encounter (HOSPITAL_COMMUNITY): Payer: Self-pay

## 2011-09-06 ENCOUNTER — Encounter (HOSPITAL_COMMUNITY)
Admission: RE | Disposition: A | Discharge status: Home / Self Care | Payer: Self-pay | Source: Ambulatory Visit | Attending: Obstetrics

## 2011-09-06 ENCOUNTER — Ambulatory Visit (HOSPITAL_COMMUNITY): Payer: BC Managed Care – PPO | Admitting: Anesthesiology

## 2011-09-06 ENCOUNTER — Encounter (HOSPITAL_COMMUNITY): Payer: Self-pay | Admitting: Anesthesiology

## 2011-09-06 ENCOUNTER — Encounter (HOSPITAL_COMMUNITY): Payer: Self-pay | Admitting: *Deleted

## 2011-09-06 ENCOUNTER — Observation Stay (HOSPITAL_COMMUNITY)
Admission: RE | Admit: 2011-09-06 | Discharge: 2011-09-07 | Disposition: A | Discharge status: Home / Self Care | Payer: BC Managed Care – PPO | Source: Ambulatory Visit | Attending: Obstetrics | Admitting: Obstetrics

## 2011-09-06 DIAGNOSIS — N83209 Unspecified ovarian cyst, unspecified side: Principal | ICD-10-CM | POA: Insufficient documentation

## 2011-09-06 DIAGNOSIS — N949 Unspecified condition associated with female genital organs and menstrual cycle: Secondary | ICD-10-CM | POA: Insufficient documentation

## 2011-09-06 DIAGNOSIS — Z01818 Encounter for other preprocedural examination: Secondary | ICD-10-CM | POA: Insufficient documentation

## 2011-09-06 DIAGNOSIS — I471 Supraventricular tachycardia: Secondary | ICD-10-CM

## 2011-09-06 DIAGNOSIS — Z01812 Encounter for preprocedural laboratory examination: Secondary | ICD-10-CM | POA: Insufficient documentation

## 2011-09-06 HISTORY — PX: LAPAROSCOPY: SHX197

## 2011-09-06 LAB — COMPREHENSIVE METABOLIC PANEL
ALT: 20 U/L (ref 0–35)
AST: 18 U/L (ref 0–37)
Albumin: 3.5 g/dL (ref 3.5–5.2)
Alkaline Phosphatase: 69 U/L (ref 39–117)
BUN: 8 mg/dL (ref 6–23)
CO2: 25 mEq/L (ref 19–32)
Calcium: 8.9 mg/dL (ref 8.4–10.5)
Chloride: 105 mEq/L (ref 96–112)
Creatinine, Ser: 0.59 mg/dL (ref 0.50–1.10)
GFR calc Af Amer: >90 mL/min (ref >90)
GFR calc non Af Amer: >90 mL/min (ref >90)
Glucose, Bld: 120 mg/dL — ABNORMAL HIGH (ref 70–99)
Potassium: 4.1 mEq/L (ref 3.5–5.1)
Sodium: 138 mEq/L (ref 135–145)
Total Bilirubin: 0.2 mg/dL — ABNORMAL LOW (ref 0.3–1.2)
Total Protein: 6.2 g/dL (ref 6.0–8.3)

## 2011-09-06 LAB — TYPE AND SCREEN
ABO/RH(D): O POS
Antibody Screen: NEGATIVE

## 2011-09-06 LAB — CBC
HCT: 35.4 % — ABNORMAL LOW (ref 36.0–46.0)
Hemoglobin: 12.2 g/dL (ref 12.0–15.0)
MCH: 30.3 pg (ref 26.0–34.0)
MCHC: 34.5 g/dL (ref 30.0–36.0)
MCV: 88.1 fL (ref 78.0–100.0)
Platelets: 219 10*3/uL (ref 150–400)
RBC: 4.02 MIL/uL (ref 3.87–5.11)
RDW: 12.4 % (ref 11.5–15.5)
WBC: 7.7 10*3/uL (ref 4.0–10.5)

## 2011-09-06 LAB — GLUCOSE, CAPILLARY: Glucose-Capillary: 162 mg/dL — ABNORMAL HIGH (ref 70–99)

## 2011-09-06 LAB — ABO/RH: ABO/RH(D): O POS

## 2011-09-06 SURGERY — LAPAROSCOPY OPERATIVE
Anesthesia: General | Laterality: Left | Wound class: Clean Contaminated

## 2011-09-06 MED ORDER — HYDROMORPHONE HCL PF 1 MG/ML IJ SOLN: 1.0000 mg | INTRAMUSCULAR | Status: DC | PRN

## 2011-09-06 MED ORDER — PROPOFOL 10 MG/ML IV EMUL
INTRAVENOUS | Status: DC | PRN
  Administered 2011-09-06: 40 mg via INTRAVENOUS
  Administered 2011-09-06: 150 mg via INTRAVENOUS

## 2011-09-06 MED ORDER — ONDANSETRON HCL 4 MG PO TABS: 4.0000 mg | ORAL_TABLET | Freq: Four times a day (QID) | ORAL | Status: DC | PRN

## 2011-09-06 MED ORDER — ZOLPIDEM TARTRATE 5 MG PO TABS: 5.0000 mg | ORAL_TABLET | Freq: Every evening | ORAL | Status: DC | PRN

## 2011-09-06 MED ORDER — CLONAZEPAM 0.5 MG PO TABS
0.5000 mg | ORAL_TABLET | Freq: Two times a day (BID) | ORAL | Status: DC | PRN
  Administered 2011-09-07: 0.5 mg via ORAL
  Filled 2011-09-06: qty 1

## 2011-09-06 MED ORDER — BUPIVACAINE HCL (PF) 0.25 % IJ SOLN
INTRAMUSCULAR | Status: AC
  Filled 2011-09-06: qty 30

## 2011-09-06 MED ORDER — ONDANSETRON HCL 4 MG/2ML IJ SOLN
INTRAMUSCULAR | Status: DC | PRN
  Administered 2011-09-06: 4 mg via INTRAVENOUS

## 2011-09-06 MED ORDER — LABETALOL HCL 200 MG PO TABS: 200.0000 mg | ORAL_TABLET | Freq: Two times a day (BID) | ORAL | Status: DC

## 2011-09-06 MED ORDER — IBUPROFEN 600 MG PO TABS
600.0000 mg | ORAL_TABLET | Freq: Four times a day (QID) | ORAL | Status: DC | PRN
  Administered 2011-09-07: 600 mg via ORAL
  Filled 2011-09-06: qty 1

## 2011-09-06 MED ORDER — PANTOPRAZOLE SODIUM 40 MG PO TBEC
40.0000 mg | DELAYED_RELEASE_TABLET | Freq: Once | ORAL | Status: AC
  Administered 2011-09-06: 40 mg via ORAL

## 2011-09-06 MED ORDER — PANTOPRAZOLE SODIUM 40 MG PO TBEC
DELAYED_RELEASE_TABLET | ORAL | Status: AC
  Administered 2011-09-06: 40 mg via ORAL
  Filled 2011-09-06: qty 1

## 2011-09-06 MED ORDER — RIVAROXABAN 20 MG PO TABS: 20.0000 mg | ORAL_TABLET | Freq: Every day | ORAL | Status: DC

## 2011-09-06 MED ORDER — ROCURONIUM BROMIDE 50 MG/5ML IV SOLN
INTRAVENOUS | Status: AC
  Filled 2011-09-06: qty 1

## 2011-09-06 MED ORDER — BUPIVACAINE HCL (PF) 0.25 % IJ SOLN
INTRAMUSCULAR | Status: DC | PRN
  Administered 2011-09-06: 30 mL

## 2011-09-06 MED ORDER — SUFENTANIL CITRATE 50 MCG/ML IV SOLN
INTRAVENOUS | Status: DC | PRN
  Administered 2011-09-06: 10 ug via INTRAVENOUS
  Administered 2011-09-06: 5 ug via INTRAVENOUS
  Administered 2011-09-06 (×2): 10 ug via INTRAVENOUS
  Administered 2011-09-06: 5 ug via INTRAVENOUS
  Administered 2011-09-06: 10 ug via INTRAVENOUS

## 2011-09-06 MED ORDER — ROCURONIUM BROMIDE 100 MG/10ML IV SOLN
INTRAVENOUS | Status: DC | PRN
  Administered 2011-09-06: 40 mg via INTRAVENOUS
  Administered 2011-09-06: 10 mg via INTRAVENOUS
  Administered 2011-09-06: 20 mg via INTRAVENOUS

## 2011-09-06 MED ORDER — ONDANSETRON HCL 4 MG/2ML IJ SOLN: 4.0000 mg | Freq: Four times a day (QID) | INTRAMUSCULAR | Status: DC | PRN

## 2011-09-06 MED ORDER — GLYCOPYRROLATE 0.2 MG/ML IJ SOLN
INTRAMUSCULAR | Status: DC | PRN
  Administered 2011-09-06: 0.4 mg via INTRAVENOUS

## 2011-09-06 MED ORDER — SODIUM CHLORIDE 0.9 % IV SOLN
INTRAVENOUS | Status: DC
  Administered 2011-09-06: 19:00:00 via INTRAVENOUS

## 2011-09-06 MED ORDER — CLONIDINE HCL 0.1 MG PO TABS
0.1000 mg | ORAL_TABLET | Freq: Every day | ORAL | Status: DC
  Administered 2011-09-06: 0.1 mg via ORAL
  Filled 2011-09-06: qty 1

## 2011-09-06 MED ORDER — DEXAMETHASONE SODIUM PHOSPHATE 10 MG/ML IJ SOLN
INTRAMUSCULAR | Status: DC | PRN
  Administered 2011-09-06: 10 mg via INTRAVENOUS

## 2011-09-06 MED ORDER — PANTOPRAZOLE SODIUM 40 MG PO TBEC
40.0000 mg | DELAYED_RELEASE_TABLET | Freq: Every day | ORAL | Status: DC
  Administered 2011-09-06: 40 mg via ORAL
  Filled 2011-09-06 (×2): qty 1

## 2011-09-06 MED ORDER — MIDAZOLAM HCL 5 MG/5ML IJ SOLN
INTRAMUSCULAR | Status: DC | PRN
  Administered 2011-09-06: 2 mg via INTRAVENOUS

## 2011-09-06 MED ORDER — INDIGOTINDISULFONATE SODIUM 8 MG/ML IJ SOLN
INTRAMUSCULAR | Status: AC
  Filled 2011-09-06: qty 5

## 2011-09-06 MED ORDER — LACTATED RINGERS IR SOLN
Status: DC | PRN
  Administered 2011-09-06: 3000 mL

## 2011-09-06 MED ORDER — NEOSTIGMINE METHYLSULFATE 1 MG/ML IJ SOLN
INTRAMUSCULAR | Status: DC | PRN
  Administered 2011-09-06: 2 mg via INTRAVENOUS

## 2011-09-06 MED ORDER — PHENYLEPHRINE HCL 10 MG/ML IJ SOLN
INTRAMUSCULAR | Status: DC | PRN
  Administered 2011-09-06: 40 ug via INTRAVENOUS
  Administered 2011-09-06: 80 ug via INTRAVENOUS
  Administered 2011-09-06: 40 ug via INTRAVENOUS

## 2011-09-06 MED ORDER — VASOPRESSIN 20 UNIT/ML IJ SOLN
INTRAMUSCULAR | Status: AC
  Filled 2011-09-06: qty 1

## 2011-09-06 MED ORDER — PHENOBARBITAL 64.8 MG PO TABS
64.8000 mg | ORAL_TABLET | Freq: Every day | ORAL | Status: DC
  Filled 2011-09-06: qty 1

## 2011-09-06 MED ORDER — LIDOCAINE HCL (CARDIAC) 20 MG/ML IV SOLN
INTRAVENOUS | Status: AC
  Filled 2011-09-06: qty 5

## 2011-09-06 MED ORDER — SCOPOLAMINE 1 MG/3DAYS TD PT72
MEDICATED_PATCH | TRANSDERMAL | Status: AC
  Administered 2011-09-06: 1.5 mg via TRANSDERMAL
  Filled 2011-09-06: qty 1

## 2011-09-06 MED ORDER — MIDAZOLAM HCL 2 MG/2ML IJ SOLN
INTRAMUSCULAR | Status: AC
  Filled 2011-09-06: qty 2

## 2011-09-06 MED ORDER — ARTIFICIAL TEARS OP OINT
TOPICAL_OINTMENT | OPHTHALMIC | Status: DC | PRN
  Administered 2011-09-06: 1 via OPHTHALMIC

## 2011-09-06 MED ORDER — KETOROLAC TROMETHAMINE 30 MG/ML IJ SOLN: 30.0000 mg | Freq: Four times a day (QID) | INTRAMUSCULAR | Status: DC

## 2011-09-06 MED ORDER — PROPOFOL 10 MG/ML IV EMUL
INTRAVENOUS | Status: AC
  Filled 2011-09-06: qty 40

## 2011-09-06 MED ORDER — NEOSTIGMINE METHYLSULFATE 1 MG/ML IJ SOLN
INTRAMUSCULAR | Status: AC
  Filled 2011-09-06: qty 10

## 2011-09-06 MED ORDER — LIDOCAINE HCL (CARDIAC) 20 MG/ML IV SOLN
INTRAVENOUS | Status: DC | PRN
  Administered 2011-09-06: 80 mg via INTRAVENOUS

## 2011-09-06 MED ORDER — GLYCOPYRROLATE 0.2 MG/ML IJ SOLN
INTRAMUSCULAR | Status: AC
  Filled 2011-09-06: qty 2

## 2011-09-06 MED ORDER — OXYCODONE-ACETAMINOPHEN 5-325 MG PO TABS
1.0000 | ORAL_TABLET | ORAL | Status: DC | PRN
Start: 2011-09-06 — End: 2011-09-07
  Administered 2011-09-06: 1 via ORAL
  Filled 2011-09-06 (×2): qty 1

## 2011-09-06 MED ORDER — LACTATED RINGERS IV SOLN
INTRAVENOUS | Status: DC
  Administered 2011-09-06 (×4): via INTRAVENOUS

## 2011-09-06 MED ORDER — KETOROLAC TROMETHAMINE 30 MG/ML IJ SOLN
30.0000 mg | Freq: Four times a day (QID) | INTRAMUSCULAR | Status: DC
  Administered 2011-09-06: 30 mg via INTRAVENOUS
  Filled 2011-09-06: qty 1

## 2011-09-06 MED ORDER — DEXAMETHASONE SODIUM PHOSPHATE 10 MG/ML IJ SOLN
INTRAMUSCULAR | Status: AC
  Filled 2011-09-06: qty 1

## 2011-09-06 MED ORDER — OXYCODONE-ACETAMINOPHEN 5-325 MG PO TABS: 2.0000 | ORAL_TABLET | ORAL | Status: DC | PRN

## 2011-09-06 MED ORDER — ONDANSETRON HCL 4 MG/2ML IJ SOLN
INTRAMUSCULAR | Status: AC
  Filled 2011-09-06: qty 2

## 2011-09-06 MED ORDER — SCOPOLAMINE 1 MG/3DAYS TD PT72
1.0000 | MEDICATED_PATCH | Freq: Once | TRANSDERMAL | Status: DC
  Administered 2011-09-06: 1.5 mg via TRANSDERMAL

## 2011-09-06 MED ORDER — SUFENTANIL CITRATE 50 MCG/ML IV SOLN
INTRAVENOUS | Status: AC
  Filled 2011-09-06: qty 1

## 2011-09-06 MED ORDER — HEPARIN SODIUM (PORCINE) 5000 UNIT/ML IJ SOLN
INTRAMUSCULAR | Status: DC | PRN
  Administered 2011-09-06: 5000 [IU] via SUBCUTANEOUS

## 2011-09-06 MED ORDER — HYDROMORPHONE HCL PF 1 MG/ML IJ SOLN: 0.2500 mg | INTRAMUSCULAR | Status: DC | PRN

## 2011-09-06 MED ORDER — MENTHOL 3 MG MT LOZG
1.0000 | LOZENGE | OROMUCOSAL | Status: DC | PRN
  Administered 2011-09-06: 3 mg via ORAL
  Filled 2011-09-06: qty 9

## 2011-09-06 SURGICAL SUPPLY — 83 items
BARRIER ADHS 3X4 INTERCEED (GAUZE/BANDAGES/DRESSINGS) ×2 IMPLANT
BLADE LAPAROSCOPIC MORCELL KIT (BLADE) IMPLANT
BLADE SURG 15 STRL LF C SS BP (BLADE) ×1 IMPLANT
BLADE SURG 15 STRL SS (BLADE) ×1
CABLE HIGH FREQUENCY MONO STRZ (ELECTRODE) ×2 IMPLANT
CHLORAPREP W/TINT 26ML (MISCELLANEOUS) ×2 IMPLANT
CLOTH BEACON ORANGE TIMEOUT ST (SAFETY) ×2 IMPLANT
CONT PATH 16OZ SNAP LID 3702 (MISCELLANEOUS) ×2 IMPLANT
COVER MAYO STAND STRL (DRAPES) IMPLANT
COVER TABLE BACK 60X90 (DRAPES) ×2 IMPLANT
COVER TIP SHEARS 8 DVNC (MISCELLANEOUS) ×1 IMPLANT
COVER TIP SHEARS 8MM DA VINCI (MISCELLANEOUS) ×1
DECANTER SPIKE VIAL GLASS SM (MISCELLANEOUS) ×2 IMPLANT
DERMABOND ADVANCED (GAUZE/BANDAGES/DRESSINGS) ×1
DERMABOND ADVANCED .7 DNX12 (GAUZE/BANDAGES/DRESSINGS) ×1 IMPLANT
DRAPE HUG U DISPOSABLE (DRAPE) ×2 IMPLANT
DRAPE LG THREE QUARTER DISP (DRAPES) ×4 IMPLANT
DRAPE MONITOR DA VINCI (DRAPE) IMPLANT
DRAPE WARM FLUID 44X44 (DRAPE) ×2 IMPLANT
ELECT NEEDLE TIP 2.8 STRL (NEEDLE) ×2 IMPLANT
ELECT REM PT RETURN 9FT ADLT (ELECTROSURGICAL) ×2
ELECTRODE REM PT RTRN 9FT ADLT (ELECTROSURGICAL) ×1 IMPLANT
EVACUATOR SMOKE 8.L (FILTER) ×2 IMPLANT
FORCEPS CUTTING 33CM 5MM (CUTTING FORCEPS) ×2 IMPLANT
GAUZE VASELINE 3X9 (GAUZE/BANDAGES/DRESSINGS) IMPLANT
GLOVE BIO SURGEON STRL SZ 6.5 (GLOVE) ×2 IMPLANT
GLOVE BIOGEL PI IND STRL 7.0 (GLOVE) ×2 IMPLANT
GLOVE BIOGEL PI INDICATOR 7.0 (GLOVE) ×2
GOWN PREVENTION PLUS LG XLONG (DISPOSABLE) ×12 IMPLANT
GOWN STRL REIN XL XLG (GOWN DISPOSABLE) ×12 IMPLANT
IV STOPCOCK 4 WAY 40  W/Y SET (IV SOLUTION) ×1
IV STOPCOCK 4 WAY 40 W/Y SET (IV SOLUTION) ×1 IMPLANT
KIT ACCESSORY DA VINCI DISP (KITS)
KIT ACCESSORY DVNC DISP (KITS) IMPLANT
KIT DISP ACCESSORY 4 ARM (KITS) IMPLANT
MANIPULATOR UTERINE 4.5 ZUMI (MISCELLANEOUS) ×2 IMPLANT
NEEDLE HYPO 22GX1.5 SAFETY (NEEDLE) IMPLANT
NEEDLE INSUFFLATION 14GA 120MM (NEEDLE) IMPLANT
OCCLUDER COLPOPNEUMO (BALLOONS) IMPLANT
PACK LAPAROSCOPY BASIN (CUSTOM PROCEDURE TRAY) IMPLANT
PACK LAVH (CUSTOM PROCEDURE TRAY) ×4 IMPLANT
PAD PREP 24X48 CUFFED NSTRL (MISCELLANEOUS) ×4 IMPLANT
PENCIL BUTTON HOLSTER BLD 10FT (ELECTRODE) ×2 IMPLANT
POUCH SPECIMEN RETRIEVAL 10MM (ENDOMECHANICALS) ×2 IMPLANT
PROTECTOR NERVE ULNAR (MISCELLANEOUS) ×4 IMPLANT
SCISSORS LAP 5X35 DISP (ENDOMECHANICALS) ×2 IMPLANT
SEALER TISSUE G2 CVD JAW 35 (ENDOMECHANICALS) IMPLANT
SEALER TISSUE G2 CVD JAW 45CM (ENDOMECHANICALS)
SET CYSTO W/LG BORE CLAMP LF (SET/KITS/TRAYS/PACK) IMPLANT
SET IRRIG TUBING LAPAROSCOPIC (IRRIGATION / IRRIGATOR) ×2 IMPLANT
SOLUTION ELECTROLUBE (MISCELLANEOUS) ×2 IMPLANT
SPONGE LAP 18X18 X RAY DECT (DISPOSABLE) IMPLANT
STENT BALLN UTERINE 3CM 6FR (Stent) IMPLANT
STENT BALLN UTERINE 4CM 6FR (STENTS) IMPLANT
SUT VIC AB 0 CT1 27 (SUTURE)
SUT VIC AB 0 CT1 27XBRD ANTBC (SUTURE) IMPLANT
SUT VIC AB 0 CT2 27 (SUTURE) IMPLANT
SUT VIC AB 2-0 CT1 27 (SUTURE)
SUT VIC AB 2-0 CT1 TAPERPNT 27 (SUTURE) IMPLANT
SUT VIC AB 4-0 PS2 27 (SUTURE) ×4 IMPLANT
SUT VICRYL 0 UR6 27IN ABS (SUTURE) ×4 IMPLANT
SUT VICRYL 4-0 PS2 18IN ABS (SUTURE) IMPLANT
SYR 50ML LL SCALE MARK (SYRINGE) ×2 IMPLANT
SYR 5ML LL (SYRINGE) ×2 IMPLANT
SYSTEM CONVERTIBLE TROCAR (TROCAR) IMPLANT
TIP UTERINE 5.1X6CM LAV DISP (MISCELLANEOUS) IMPLANT
TIP UTERINE 6.7X10CM GRN DISP (MISCELLANEOUS) IMPLANT
TIP UTERINE 6.7X6CM WHT DISP (MISCELLANEOUS) IMPLANT
TIP UTERINE 6.7X8CM BLUE DISP (MISCELLANEOUS) IMPLANT
TOWEL OR 17X24 6PK STRL BLUE (TOWEL DISPOSABLE) ×6 IMPLANT
TRAY FOLEY BAG SILVER LF 14FR (CATHETERS) ×2 IMPLANT
TRAY FOLEY CATH 14FR (SET/KITS/TRAYS/PACK) ×2 IMPLANT
TROCAR 12M 150ML BLUNT (TROCAR) IMPLANT
TROCAR BALLN 12MMX100 BLUNT (TROCAR) ×2 IMPLANT
TROCAR DISP BLADELESS 8 DVNC (TROCAR) IMPLANT
TROCAR DISP BLADELESS 8MM (TROCAR)
TROCAR XCEL 12X100 BLDLESS (ENDOMECHANICALS) IMPLANT
TROCAR XCEL NON-BLD 11X100MML (ENDOMECHANICALS) IMPLANT
TROCAR XCEL NON-BLD 5MMX100MML (ENDOMECHANICALS) ×4 IMPLANT
TROCAR Z-THREAD BLADED 12X100M (TROCAR) IMPLANT
TUBING FILTER THERMOFLATOR (ELECTROSURGICAL) ×4 IMPLANT
WARMER LAPAROSCOPE (MISCELLANEOUS) ×2 IMPLANT
WATER STERILE IRR 1000ML POUR (IV SOLUTION) ×6 IMPLANT

## 2011-09-06 NOTE — Brief Op Note (Signed)
09/06/2011  2:21 PM  PATIENT:  [name]  56 y.o. female  PRE-OPERATIVE DIAGNOSIS:  Left Ovarian Cyst  POST-OPERATIVE DIAGNOSIS:  Left Ovarian Cyst  PROCEDURE:  Procedure(s) (LRB): LAPAROSCOPY OPERATIVE (Left) LAPAROSCOPIC UNILATERAL SALPINGO OOPHERECTOMY (Left)  SURGEON:  Surgeon(s) and Role:    Tresa Endo A. Ernestina Penna, MD - Primary    * Valarie Merino, MD - Assisting  PHYSICIAN ASSISTANT:   ASSISTANTSDaphine Deutscher, MD   ANESTHESIA:   general  EBL:  Total I/O In: 2400 [I.V.:2400] Out: 275 [Urine:250; Blood:25]  BLOOD ADMINISTERED:none  DRAINS: none   LOCAL MEDICATIONS USED:  MARCAINE     SPECIMEN:  Source of Specimen:  pelvic washings, L tube and ovary  DISPOSITION OF SPECIMEN:  PATHOLOGY  COUNTS:  YES  TOURNIQUET:  * No tourniquets in log *  DICTATION: .Note written in EPIC  PLAN OF CARE: Admit to inpatient   PATIENT DISPOSITION:  PACU - hemodynamically stable.   Delay start of Pharmacological VTE agent (>24hrs) due to surgical blood loss or risk of bleeding: yes

## 2011-09-06 NOTE — Transfer of Care (Signed)
Immediate Anesthesia Transfer of Care Note  Patient: [name]  Procedure(s) Performed: Procedure(s) (LRB): LAPAROSCOPY OPERATIVE (Left) LAPAROSCOPIC UNILATERAL SALPINGO OOPHERECTOMY (Left)  Patient Location: PACU  Anesthesia Type: General  Level of Consciousness: sedated  Airway & Oxygen Therapy: Patient Spontanous Breathing and Patient connected to nasal cannula oxygen  Post-op Assessment: Report given to PACU RN and Post -op Vital signs reviewed and stable  Post vital signs: stable  Complications: No apparent anesthesia complications

## 2011-09-06 NOTE — H&P (Signed)
See scaned H&P under media tab for full info  In short, complicated medical pt (DM, Afib w/ prior CVA now on Xarelto, freq diverticulitis, melanoma) with pelvic pain and 8 cm simple L ovarian cyst.  Last Xarelto Tues night, had bowel prep.  Pt understands no guarantee of pain relief, risks of bleeding, risks of VTE off meds, risks due to possible scarred abdomen.  Proceed as planned w/ l'scopic LOA, LSO  Kaleya Douse A. 09/06/2011 12:00 PM

## 2011-09-06 NOTE — Anesthesia Postprocedure Evaluation (Signed)
  Anesthesia Post-op Note  Patient: [name]  Procedure(s) Performed: Procedure(s) (LRB): LAPAROSCOPY OPERATIVE (Left) LAPAROSCOPIC UNILATERAL SALPINGO OOPHERECTOMY (Left) Patient is awake and responsive. Pain and nausea are reasonably well controlled. Vital signs are stable and clinically acceptable. Oxygen saturation is clinically acceptable. There are no apparent anesthetic complications at this time. Patient is ready for discharge.

## 2011-09-06 NOTE — Op Note (Signed)
09/06/2011  2:21 PM  PATIENT:  [name]  56 y.o. female  PRE-OPERATIVE DIAGNOSIS:  Left Ovarian Cyst  POST-OPERATIVE DIAGNOSIS:  Left Ovarian Cyst  PROCEDURE:  Procedure(s) (LRB): LAPAROSCOPY OPERATIVE (Left) LAPAROSCOPIC UNILATERAL SALPINGO OOPHERECTOMY (Left)  SURGEON:  Surgeon(s) and Role:    Tresa Endo A. Ernestina Penna, MD - Primary    * Valarie Merino, MD - Assisting  PHYSICIAN ASSISTANT:   ASSISTANTSDaphine Deutscher, MD   ANESTHESIA:   general  EBL:  Total I/O In: 2400 [I.V.:2400] Out: 275 [Urine:250; Blood:25]  BLOOD ADMINISTERED:none  DRAINS: none   LOCAL MEDICATIONS USED:  MARCAINE     SPECIMEN:  Source of Specimen:  pelvic washings, L tube and ovary  DISPOSITION OF SPECIMEN:  PATHOLOGY Complications: None Antibiotics: None Findings: 10 cm simple left ovarian cyst, no identifiable normal left ovary, attenuated left fallopian tube, normal uterus, normal right tube and ovary, normal liver edge, normal gallbladder, no significant adhesive disease in the pelvis, small diverticula noted, noninflamed. Peristalsing left ureter both pre-and post ovarian cystectomy.  COUNTS:  YES  TOURNIQUET:  * No tourniquets in log *  DICTATION: .Note written in EPIC  PLAN OF CARE: Admit to inpatient   PATIENT DISPOSITION:  PACU - hemodynamically stable.   Delay start of Pharmacological VTE agent (>24hrs) due to surgical blood loss or risk of bleeding: yes  Indications: This is a 56 year old patient with a complicated medical history who complains of left lower quadrant pain and found to have any 8 cm simple left ovarian cyst. After informed consent and discussion of risks of surgery the patient opted for a laparoscopic left ovarian cystectomy  Procedure: After informed consent including discussion of risks of bleeding, infection, failure to achieve desired outcome and discussion of alternatives, the patient was taken to the operating room where general anesthesia was initiated  without difficulty. She was prepped and draped in normal sterile fashion in the dorsal supine lithotomy position. A Foley catheter was inserted sterilely into the bladder. A bimanual examination was done to assess the size and position of the uterus. A large cystic mass was found in the left adnexa The Peterson speculum was inserted into the vagina. A single-tooth tenaculum was used to grasp the anterior lip of the cervix. The cervix was dilated to a #21 Pratt  dilator. A ZUMI uterine manipulator was easily inserted into the uterus and the intrauterine balloon was inflated. Due to the narrow introitus, the atrophic vaginal walls and the anticoagulation some abrasions were noted in the vagina during insertion of the speculum.   Attention was then turned to the patient's abdomen. 0.5 % marcaine was used prior to all incision.   A 10 mm incision was made in the umbilicus and blunt and sharp dissection was done until the fascia was identified. This was then grasped with Kocher clamps x2 and entered sharply. A pursestring suture of 0 Vicryl was then placed along the incision and a non-bladed Roseanne Reno was inserted into the peritoneal cavity. Intraperitoneal placement was confirmed with the use of the camera and pneumoperitoneum was created to 15 mm of mercury. The pursestring suture was secured around the port and pneumoperitoneum was maintained. Brief survey of the abdomen and pelvis was done with findings as above. The abdominal wall was assessed and additional port sites were marked. 5 mm incisions were placed in the right and left lower quadrant. Ports were placed under direct visualization.   Pelvic washings were obtained. The uterus was placed on stretch and the ovarian  cyst was manipulated. It was very difficult to see the peritoneal wall and to get good visualization of the ureter due to the heaviness of the ovarian cyst. There were some small filmy adhesions between the left ovarian cyst and some loops of  bowel. These were taken down sharply with hot scissors. Several minutes were spent in manipulating the ovarian cyst to have better access to the peritoneal wall however this was unsuccessful. The ovarian cyst was evaluated and felt to be smooth and simple and at this point the decision was made to drain the ovarian cyst to allow better manipulation and visualization. The monopolar scissors were used to make a small incision in the ovarian cyst and the Nezhat suction irrigator was quickly placed through this incision to drain the cyst. At this point were able to see the peristalsing left ureter and surgical mapping was carried out. The left infundibulopelvic ligament was serially cauterized and transected with the tripolar the mesial salpinx was serially transected and cauterized the left fallopian tube and left ovary excised together. At the angle of the uterus the left utero-ovarian ligament was serially transected and cauterized. Once the cyst and ovary were completely removed,  the cauterized pedicle was reevaluated and found to be hemostatic. A small firm nodule was noted along the remaining portion of the left IP ligament this was grasped and removed with blunt dissection. The specimen was sent to the left ovarian cyst.  The camera was changed. A Endo Catch bag was placed through the umbilicus port. A 5 mm camera was placed in the left lower quadrant port the ovarian cyst and peritoneal nodule was placed into the Endo Catch bag which was easily removed through the umbilicus incision. Count was again placed through the umbilicus port and irrigation was carried out the left cornual region had some additional cautery for hemostasis. The pelvis appeared hemostatic and the decision was made to and to the case after placing a piece of Interceed around the cauterized pedicles. Pneumoperitoneum was released hemostasis was assured and all ports were removed.  The umbilical incision had the fascial pursestring  closed. And the skin incisions were closed with 4-0 Vicryl. Dermabond was placed over these incisions. The Foley catheter was removed the ZUMI uterine manipulator with was removed and while there were some abrasions on the vagina no active bleeding was noted.  The patient tolerated the procedure well sponge lap and needle counts were correct x3 and patient was taken to the recovery room in stable condition \ Waco Foerster A. 09/06/2011 2:33 PM

## 2011-09-07 ENCOUNTER — Encounter (HOSPITAL_COMMUNITY): Payer: Self-pay | Admitting: Obstetrics

## 2011-09-07 MED ORDER — OXYCODONE-ACETAMINOPHEN 5-325 MG PO TABS: 1.0000 | ORAL_TABLET | ORAL | Status: AC | PRN

## 2011-09-07 MED ORDER — MENTHOL 3 MG MT LOZG: 1.0000 | LOZENGE | OROMUCOSAL | Status: DC | PRN

## 2011-09-07 MED FILL — Heparin Sodium (Porcine) Inj 5000 Unit/ML: INTRAMUSCULAR | Qty: 2 | Status: AC

## 2011-09-07 NOTE — Anesthesia Postprocedure Evaluation (Signed)
Anesthesia Post Note  Patient: [name]  Procedure(s) Performed: Procedure(s) (LRB): LAPAROSCOPY OPERATIVE (Left) LAPAROSCOPIC UNILATERAL SALPINGO OOPHERECTOMY (Left)  Anesthesia type: General  Patient location: Mother/Baby  Post pain: Pain level controlled  Post assessment: Post-op Vital signs reviewed  Last Vitals:  Filed Vitals:   09/07/11 0555  BP: 113/75  Pulse: 80  Temp: 36.9 C  Resp: 16    Post vital signs: Reviewed  Level of consciousness: awake and alert   Complications: No apparent anesthesia complications

## 2011-09-07 NOTE — Progress Notes (Signed)
S: pt notes pain well controlled, tol reg po this am, + void, no flatus but no N/V. No CP, no SOB, scant vaginal spotting. Pt notes throat sore/ irritated  O:  Filed Vitals:   09/07/11 0555  BP: 113/75  Pulse: 80  Temp: 98.4 F (36.9 C)  Resp: 16   I/O noted Gen: well CV: RRR Pulm: CTAB Abd: soft, approp tender, ND, incisions healing well GU: def LE: SCDs on, no edema Neuro: grossly nl  CBC    Component Value Date/Time   WBC 7.7 09/06/2011 2225   RBC 4.02 09/06/2011 2225   HGB 12.2 09/06/2011 2225   HCT 35.4* 09/06/2011 2225   PLT 219 09/06/2011 2225   MCV 88.1 09/06/2011 2225   MCH 30.3 09/06/2011 2225   MCHC 34.5 09/06/2011 2225   RDW 12.4 09/06/2011 2225   LYMPHSABS 1.3 10/11/2010 1222   MONOABS 0.6 10/11/2010 1222   EOSABS 0.1 10/11/2010 1222   BASOSABS 0.0 10/11/2010 1222     A/P POD # 1 s/p l'scopic LSO - post-op, doing well, po meds, IV stopped, d/c home - h/o CVA/ A fib, resume home meds including chronic anticoagulation - pain. Avoid Nsaids once home due to Xarelto. Will plan Miralax w/ narcotics due to h/o constipation.  Makarios Madlock A. 09/07/2011 8:28 AM

## 2011-09-07 NOTE — Progress Notes (Signed)
Post discharge UR review.

## 2011-09-07 NOTE — Addendum Note (Signed)
Addendum  created 09/07/11 0804 by Jhonnie Garner, CRNA   Modules edited:Notes Section

## 2011-09-10 ENCOUNTER — Other Ambulatory Visit: Payer: Self-pay | Admitting: Internal Medicine

## 2011-09-10 ENCOUNTER — Telehealth: Payer: Self-pay | Admitting: Internal Medicine

## 2011-09-10 MED ORDER — POLYETHYLENE GLYCOL 3350 17 GM/SCOOP PO POWD: ORAL | Status: DC

## 2011-09-10 NOTE — Telephone Encounter (Signed)
Patient had surgery on Friday to remove 12 cm ovarian cyst.  She was instructed to make sure she does not get constipated.  She was asked to increase her Miralax to BID.  I have sent in a new rx for Miralax

## 2011-09-11 NOTE — Discharge Summary (Signed)
[name] MRN: [id number] DOB/AGE: [birth year] 54 y.o.  Admit date: 09/06/2011 Discharge date: 09/07/11  Admission Diagnoses: Left ovarian cyst  Discharge Diagnoses: Left ovarian cyst        Active Problems:  * No active hospital problems. *    Discharged Condition: stable  Hospital Course: Patient was admitted for planned left salpingo-oophorectomy after Xarelto anticoagulation was stopped the day prior. The patient had an uneventful left salpingo-oophorectomy after a large 10 cm ovarian cyst was drained. Patient had intraoperative general surgery consultation to evaluate her diverticulosis. Patient had minimal bleeding through the surgery. Patient was observed overnight vital signs remained stable, bleeding remained minimal. Postoperative day #1 patient was meeting all discharge criteria, had her pain well controlled, urine output was normal, labs including CBC and creatinine were stable.  Consults: general surgery  Treatments: Left salpingo-oophorectomy  Disposition: 01-Home or Self Care   Medication List  As of 09/11/2011  9:40 PM   STOP taking these medications         metroNIDAZOLE 500 MG tablet         TAKE these medications         buPROPion 150 MG 24 hr tablet   Commonly known as: WELLBUTRIN XL   Take 150 mg by mouth daily.      clonazePAM 0.5 MG tablet   Commonly known as: KLONOPIN   Take 1 tablet (0.5 mg total) by mouth 2 (two) times daily as needed for anxiety.      cloNIDine 0.2 mg/24hr patch   Commonly known as: CATAPRES - Dosed in mg/24 hr   Place 1 patch onto the skin once a week.      cloNIDine 0.1 MG tablet   Commonly known as: CATAPRES   Take 0.1 mg by mouth at bedtime.      hydrocortisone 25 MG suppository   Commonly known as: ANUSOL-HC   Insert on rectally nightly x 12 then prn      hydrocortisone-pramoxine 2.5-1 % rectal cream   Commonly known as: ANALPRAM-HC   Apply BID prn to external hemorrhoids      labetalol 200 MG tablet   Commonly known as: NORMODYNE   Take 200 mg by mouth 2 (two) times daily.      LORazepam 0.5 MG tablet   Commonly known as: ATIVAN   Take 0.5 mg by mouth every 8 (eight) hours. 1/2 tablet bid      menthol-cetylpyridinium 3 MG lozenge   Commonly known as: CEPACOL   Take 1 lozenge (3 mg total) by mouth every 2 (two) hours as needed.      metoprolol 100 MG tablet   Commonly known as: LOPRESSOR   Take 1 tablet (100 mg total) by mouth 2 (two) times daily.      ondansetron 8 MG tablet   Commonly known as: ZOFRAN   Take by mouth every 8 (eight) hours as needed.      oxyCODONE-acetaminophen 5-325 MG per tablet   Commonly known as: PERCOCET/ROXICET   Take 1-2 tablets by mouth every 4 (four) hours as needed (moderate to severe pain (when tolerating fluids)).      pantoprazole 40 MG tablet   Commonly known as: PROTONIX   Take 40 mg by mouth as needed.      PHENobarbital 64.8 MG tablet   Commonly known as: LUMINAL   Take 1 tablet (64.8 mg total) by mouth at bedtime.      potassium chloride SA 20 MEQ tablet   Commonly known  as: K-DUR,KLOR-CON   20 mEq. 4 by mouth once daily      Rivaroxaban 20 MG Tabs   Commonly known as: XARELTO   Take 1 tablet by mouth daily.      traMADol 50 MG tablet   Commonly known as: ULTRAM   Take 1 tablet (50 mg total) by mouth every 8 (eight) hours as needed for pain. Maximum dose= 8 tablets per day      VITAMIN B-12 IJ   Inject as directed every 30 (thirty) days.      Vitamin D3 50000 UNITS Caps   Take by mouth daily. 3 x weekly             Signed: Lendon Colonel., MD 09/11/2011, 9:40 PM

## 2011-09-21 ENCOUNTER — Encounter (INDEPENDENT_AMBULATORY_CARE_PROVIDER_SITE_OTHER): Payer: Self-pay | Admitting: Surgery

## 2011-11-06 ENCOUNTER — Encounter: Payer: Self-pay | Admitting: *Deleted

## 2011-11-13 ENCOUNTER — Ambulatory Visit (INDEPENDENT_AMBULATORY_CARE_PROVIDER_SITE_OTHER): Payer: BC Managed Care – PPO | Admitting: Internal Medicine

## 2011-11-13 ENCOUNTER — Encounter: Payer: Self-pay | Admitting: Internal Medicine

## 2011-11-13 VITALS — BP 125/100 | HR 76 | Ht 63.5 in | Wt 160.8 lb

## 2011-11-13 DIAGNOSIS — Z950 Presence of cardiac pacemaker: Secondary | ICD-10-CM

## 2011-11-13 DIAGNOSIS — I498 Other specified cardiac arrhythmias: Secondary | ICD-10-CM

## 2011-11-13 NOTE — Assessment & Plan Note (Signed)
The patient's device was interrogated.  The information was reviewed. No changes were made in the programming.    

## 2011-11-13 NOTE — Progress Notes (Signed)
Patient Care Team: Madelin Headings, MD as PCP - General   HPI  [name] is a 56 y.o. 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 She also had a history of exercise induced tachycardia which was limited and in [place] was given a recommendation to try dronaderone which she failed to tolerate. Also at the recommendation of Dr. Berneice Gandy she underwent device generator replacement received a Biotronik system with CLS.  Marland Kitchen  She recalls that there is a problem related to erythematous urticarial rash following the device implantation which was potentially treated we'll to vancomycin. I had to admit that I do not recall this.  She is doing quite well apart from fatigue which she attributes to the [facility]  Overall she is doing exceedingly well. With very few palpitations   Past Medical History  Diagnosis Date  . 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 Hypoglycemia, unspecified   . Other nonspecific abnormal serum enzyme levels   . Cervicalgia   . Disturbance of skin sensation   . Other specified congenital anomalies of nervous system   . Swelling of limb   . Disorder of bone and cartilage, unspecified   . Atrial tachycardia   . CVA (cerebral infarction)     2012 with dizziness and vision change felt embolic from atrial tachy  . History of pacemaker   . POTS (postural orthostatic tachycardia syndrome)   . Diverticulosis   . Skin cancer   . Osteopenia   . DVT (deep venous thrombosis)   . Atrial fibrillation   . DM (diabetes mellitus)   . DVT (deep venous thrombosis)   . HLD (hyperlipidemia)   . HTN (hypertension)   . Pacemaker     autonomic dysfunction  . Complication of anesthesia   . PONV (postoperative nausea and vomiting)     Past Surgical History  Procedure Date  . Insert / replace / remove pacemaker 2012    1999, x 3  . Ablation saphenous vein w/ rfa     2002  . Tonsillectomy and adenoidectomy   .  Carpal tunnel release     left  . Nose surgery 2010, 2012    for nose bleeds x 2  . Fatty tumor removed     from chest  . Melanoma excision     with removal of lymph nodes, left shoulder  . Deep axillary sentinel node biopsy / excision     due to extensive Melanoma-right arm  . Laparoscopy 09/06/2011    Procedure: LAPAROSCOPY OPERATIVE;  Surgeon: Tresa Endo A. Ernestina Penna, MD;  Location: [facility];  Service: Gynecology;  Laterality: Left;  with Left Ovarian Cystectomy     Current Outpatient Prescriptions  Medication Sig Dispense Refill  . buPROPion (WELLBUTRIN XL) 150 MG 24 hr tablet Take 150 mg by mouth daily.        . Cholecalciferol (VITAMIN D3) 50000 UNITS CAPS Take by mouth daily. 3 x weekly      . clonazePAM (KLONOPIN) 0.5 MG tablet Take 1 tablet (0.5 mg total) by mouth 2 (two) times daily as needed for anxiety.  30 tablet  5  . cloNIDine (CATAPRES - DOSED IN MG/24 HR) 0.2 mg/24hr patch Place 1 patch onto the skin once a week.      . cloNIDine (CATAPRES) 0.1 MG tablet Take 0.1 mg by mouth at bedtime.      . Cyanocobalamin (VITAMIN B-12 IJ) Inject as directed every  30 (thirty) days.        . hydrocortisone (ANUSOL-HC) 25 MG suppository Insert on rectally nightly x 12 then prn  12 suppository  2  . hydrocortisone-pramoxine (ANALPRAM-HC) 2.5-1 % rectal cream Apply BID prn to external hemorrhoids  30 g  1  . labetalol (NORMODYNE) 200 MG tablet Take 200 mg by mouth 2 (two) times daily.        Marland Kitchen LORazepam (ATIVAN) 0.5 MG tablet Take 0.5 mg by mouth every 8 (eight) hours. 1/2 tablet bid       . metoprolol (LOPRESSOR) 100 MG tablet Take 1 tablet (100 mg total) by mouth 2 (two) times daily.      . ondansetron (ZOFRAN) 8 MG tablet Take by mouth every 8 (eight) hours as needed.       . pantoprazole (PROTONIX) 40 MG tablet Take 40 mg by mouth as needed.       Marland Kitchen PHENobarbital (LUMINAL) 64.8 MG tablet Take 1 tablet (64.8 mg total) by mouth at bedtime.  90 tablet  1  . polyethylene glycol powder  (GLYCOLAX/MIRALAX) powder Take 17 gm in 8 oz of water or juice 1-2 times a day for constipation  850 g  3  . potassium chloride SA (K-DUR,KLOR-CON) 20 MEQ tablet 20 mEq. 4 by mouth once daily       . Rivaroxaban 20 MG TABS Take 1 tablet by mouth daily.  90 tablet  3    Allergies  Allergen Reactions  . Doxycycline   . Erythromycin   . Iohexol      Desc: pt. states allergic to dye,-did fine with pre-meds. 02/13/05 06/14/10...pt did not state allergy to contrast, no premeds given, pt tolerated contrast.   . Penicillins   . Sulfonamide Derivatives     Review of Systems negative except from HPI and PMH  Physical Exam BP 125/100  Pulse 76  Ht 5' 3.5" (1.613 m)  Wt 160 lb 12.8 oz (72.938 kg)  BMI 28.04 kg/m2  SpO2 92%  Well developed and nourished in no acute distress HENT normal Neck supple with JVP-flat Clear Regular rate and rhythm, no murmurs or gallops Abd-soft with active BS No Clubbing cyanosis edema Skin-warm and dry A & Oriented  Grossly normal sensory and motor function     Assessment and  Plan

## 2011-11-13 NOTE — Assessment & Plan Note (Signed)
Largely controlled

## 2011-11-13 NOTE — Patient Instructions (Signed)
Your physician wants you to follow-up in:  You will receive a reminder letter in the mail two months in advance. If you don't receive a letter, please call our office to schedule the follow-up appointment.

## 2011-11-26 ENCOUNTER — Telehealth: Payer: Self-pay | Admitting: Internal Medicine

## 2011-11-26 NOTE — Telephone Encounter (Signed)
[facility], pt now out and insurance ded starts over tomorrow, so really needs called in today,  metoprolol 25mg  or 50mg  pt not sure of dosage but said it was not the extended release 100 mg, she's to take  only to take if BP runs high, pt [phone] requesting call when done

## 2011-11-27 ENCOUNTER — Other Ambulatory Visit: Payer: Self-pay | Admitting: *Deleted

## 2011-11-27 ENCOUNTER — Other Ambulatory Visit: Payer: Self-pay

## 2011-11-27 ENCOUNTER — Telehealth: Payer: Self-pay | Admitting: *Deleted

## 2011-11-27 DIAGNOSIS — I471 Supraventricular tachycardia: Secondary | ICD-10-CM

## 2011-11-27 MED ORDER — METOPROLOL TARTRATE 100 MG PO TABS: 100.0000 mg | ORAL_TABLET | Freq: Two times a day (BID) | ORAL | Status: DC

## 2011-11-27 NOTE — Telephone Encounter (Signed)
Pt calling stating she is out of metoprolol tartrate, which she takes prn for elevated BP--i spoke with dr Tenny Craw as i don't see it on med list and dr Tenny Craw gave me OK to reorder--will [facility] today--pt notified

## 2011-11-27 NOTE — Telephone Encounter (Signed)
NANCY RN WILL ASSIST IN THIS  REFILL

## 2011-12-08 ENCOUNTER — Other Ambulatory Visit: Payer: Self-pay | Admitting: Internal Medicine

## 2011-12-13 NOTE — Telephone Encounter (Signed)
Samples left at the front desk.  Pt was notified. 

## 2011-12-13 NOTE — Telephone Encounter (Signed)
F/u   Patient said she still has not received this medication, verified preferred [facility] [address].   Please research and contact patient as she is out of medication as of today. Pt is concerned about being without blood thinner she can be reached at hm#

## 2011-12-13 NOTE — Telephone Encounter (Signed)
New Problem:    Patient wanted to know if there were any samples of XARELTO 20 MG TABS available to have to hold her over until 12/18/11 when her insurance company will approve her medication.  Patient is out of medication.  Please call back.

## 2012-01-07 ENCOUNTER — Ambulatory Visit
Admission: RE | Admit: 2012-01-07 | Discharge: 2012-01-07 | Disposition: A | Discharge status: Home / Self Care | Payer: BC Managed Care – PPO | Source: Ambulatory Visit | Attending: Otolaryngology | Admitting: Otolaryngology

## 2012-01-07 ENCOUNTER — Other Ambulatory Visit: Payer: Self-pay | Admitting: Otolaryngology

## 2012-01-07 DIAGNOSIS — R059 Cough, unspecified: Secondary | ICD-10-CM

## 2012-01-07 DIAGNOSIS — R05 Cough: Secondary | ICD-10-CM

## 2012-01-11 ENCOUNTER — Other Ambulatory Visit: Payer: Self-pay | Admitting: *Deleted

## 2012-01-11 MED ORDER — POLYETHYLENE GLYCOL 3350 17 GM/SCOOP PO POWD: ORAL | Status: DC

## 2012-01-15 ENCOUNTER — Other Ambulatory Visit: Payer: Self-pay | Admitting: *Deleted

## 2012-01-15 MED ORDER — CLONAZEPAM 0.5 MG PO TABS: 0.5000 mg | ORAL_TABLET | Freq: Two times a day (BID) | ORAL | Status: DC | PRN

## 2012-01-16 ENCOUNTER — Other Ambulatory Visit: Payer: Self-pay | Admitting: Internal Medicine

## 2012-01-16 MED ORDER — CLONAZEPAM 0.5 MG PO TABS: 0.5000 mg | ORAL_TABLET | Freq: Two times a day (BID) | ORAL | Status: DC | PRN

## 2012-01-16 NOTE — Telephone Encounter (Signed)
Pt needs a refill on clonazepam sent to [facility]

## 2012-02-22 ENCOUNTER — Telehealth: Payer: Self-pay | Admitting: Internal Medicine

## 2012-02-22 ENCOUNTER — Telehealth: Payer: Self-pay

## 2012-02-22 MED ORDER — OSELTAMIVIR PHOSPHATE 75 MG PO CAPS: 75.0000 mg | ORAL_CAPSULE | Freq: Every day | ORAL | Status: DC

## 2012-02-22 NOTE — Telephone Encounter (Signed)
Pt's spouse seeing me today with flu symptoms -  empiric Tamiflu prophylaxis provided to pt (wife) as requested

## 2012-02-22 NOTE — Telephone Encounter (Signed)
Pt states her husband has the flu.  She is concerned that she may get it from him and wants to know if she can have some tamiflu called in and if she can take tamiflu with all her cardiac problems and meds.

## 2012-02-22 NOTE — Telephone Encounter (Signed)
Patient Information:  Caller Name: [name]  Phone: [phone]  Patient: [name], [name]  Gender: Female  DOB: 02/09/1956  Age: 56 Years  PCP: Berniece Andreas [facility])  Pregnant: No  Office Follow Up:  Does the office need to follow up with this patient?: Yes  Instructions For The Office: FYI, for Dr. Fabian Sharp  RN Note:  Will relay this information to Dr. Fabian Sharp  Symptoms  Reason For Call & Symptoms: Caller states she is autoimmune comprimised  Reports her  Husband was diagnosed with the flu today (12/27) by Dr. Rene Paci at the [facility] clinic. Caller had called the [facility] to see if she needed to be on Tamiflu and if there were any contraindications. Caller has no fever or current symptoms of the flu. Since caller had not heard back from clinic , Dr. Felicity Coyer reviewed caller's chart and advised caller that she should be on Tamiflu and wrote caller a prescription.  Caller verbalized concerns about contraindications with taking Tamiflu with her current meds. States she checked with her regular pharmacy regarding  contraindications and was told by pharmacist she " was good" and would be safe to take med.  Caller requested this information be relayed to Dr. Fabian Sharp.  Reviewed Health History In EMR: N/A  Reviewed Medications In EMR: N/A  Reviewed Allergies In EMR: N/A  Reviewed Surgeries / Procedures: N/A  Date of Onset of Symptoms: 02/22/2012 OB / GYN:  LMP: Unknown  Guideline(s) Used:  No Protocol Available - Information Only  Disposition Per Guideline:   Home Care  Reason For Disposition Reached:   Information only question and nurse able to answer  Advice Given:  N/A

## 2012-02-24 NOTE — Telephone Encounter (Signed)
Ok to use prophylaxis which is taking tamiflu 75 1 po qd for  10 days Did she get the flu vaccine?   It appears that med was already sent in and I agree

## 2012-02-24 NOTE — Telephone Encounter (Signed)
She can take tamiflu  It is probably too late to help now.[facility] 12/29

## 2012-02-25 NOTE — Telephone Encounter (Signed)
Spoke with husband. They are both on Tamiflu from primary care. Was prescribed for [name] also, He had fever for 3 days but now temperature is normal. Both of them did not have the flu vaccine this year. Made her an appointment for a regular visit with Dr.Ross on 2/13.

## 2012-02-26 ENCOUNTER — Other Ambulatory Visit: Payer: Self-pay | Admitting: Family Medicine

## 2012-02-26 NOTE — Telephone Encounter (Signed)
I spoke to the pt.  She did start Tamiflu.  She is coming in on 03/03/12 for her flu injection.  Verified with the pharmacist at [facility] that is was appropriate.  She also scheduled a CPE with [facility].

## 2012-03-03 ENCOUNTER — Other Ambulatory Visit: Payer: Self-pay | Admitting: Internal Medicine

## 2012-03-03 ENCOUNTER — Ambulatory Visit (INDEPENDENT_AMBULATORY_CARE_PROVIDER_SITE_OTHER): Payer: BC Managed Care – PPO | Admitting: Family Medicine

## 2012-03-03 DIAGNOSIS — Z23 Encounter for immunization: Secondary | ICD-10-CM

## 2012-03-03 MED ORDER — RIVAROXABAN 20 MG PO TABS: 20.0000 mg | ORAL_TABLET | Freq: Every day | ORAL | Status: DC

## 2012-03-03 NOTE — Telephone Encounter (Signed)
Patient wanting refill on Phenobarbital, will forward to Stormy Card RN with Dr Tenny Craw for review Appointment in February

## 2012-03-05 ENCOUNTER — Other Ambulatory Visit: Payer: Self-pay | Admitting: *Deleted

## 2012-03-05 MED ORDER — PHENOBARBITAL 64.8 MG PO TABS: 64.8000 mg | ORAL_TABLET | Freq: Every day | ORAL | Status: DC

## 2012-03-05 NOTE — Telephone Encounter (Signed)
[facility]. Patient aware of above.

## 2012-03-07 ENCOUNTER — Other Ambulatory Visit: Payer: Self-pay | Admitting: Otolaryngology

## 2012-03-07 DIAGNOSIS — J329 Chronic sinusitis, unspecified: Secondary | ICD-10-CM

## 2012-03-11 ENCOUNTER — Ambulatory Visit
Admission: RE | Admit: 2012-03-11 | Discharge: 2012-03-11 | Disposition: A | Discharge status: Home / Self Care | Payer: BC Managed Care – PPO | Source: Ambulatory Visit | Attending: Otolaryngology | Admitting: Otolaryngology

## 2012-03-11 DIAGNOSIS — J329 Chronic sinusitis, unspecified: Secondary | ICD-10-CM

## 2012-03-24 ENCOUNTER — Telehealth: Payer: Self-pay | Admitting: *Deleted

## 2012-03-24 NOTE — Telephone Encounter (Signed)
Called [facility]. They need updated orders for IV fluid therapy. Advised them to fax order to Dr.Ross to sign and we will fax back to them.

## 2012-03-24 NOTE — Telephone Encounter (Signed)
Received direct phone call from [facility] advising that IV Normal Saline is on nation backorder and they would like to substitue with [id number]. I advised them that this was a no go because she is a diet controlled diabetic and can't have IV glucose. Reminded them that she only gets IV saline 1 day per week on Sunday. They will try to reserve this for her. Will let Dr.Ross know.

## 2012-03-24 NOTE — Telephone Encounter (Signed)
Refill of fluid bags needed [phone] or fax [phone] [facility]

## 2012-03-27 ENCOUNTER — Telehealth: Payer: Self-pay

## 2012-03-27 NOTE — Telephone Encounter (Signed)
Coretta called from [facility] called about Refill of fluid bags. Told her I will send a message to the nurse because in previous note it stated Dr. Tenny Craw needed to sign. Please call back Coretta at [phone]

## 2012-03-27 NOTE — Telephone Encounter (Signed)
New problem:   1.Need B- 12 refill. -90 days supply    2.[facility] on [address]    3.Would like a call back

## 2012-03-28 ENCOUNTER — Other Ambulatory Visit: Payer: Self-pay | Admitting: *Deleted

## 2012-03-28 NOTE — Telephone Encounter (Signed)
Error

## 2012-03-28 NOTE — Telephone Encounter (Signed)
Spoke with pharmacist/ coretta, they will wait till Monday 04/02/11 when Dr Tenny Craw in office. Requesting refills of IV fluids and non cardiac meds. Will forward to dr/nurse.

## 2012-03-28 NOTE — Telephone Encounter (Signed)
I spoke with Dr. Tenny Craw, she will refill the patient's Vitamin B 12 injections to be done once monthly. Doylene Bode has attempted to call the patient on her home and cell numbers to verify her pharmacy because the patient initially told her [facility]. There is no [facility] on file for her. We will await a call back from the patient to clarify the pharmacy. It is now 5:00 pm ( Gina tried to call the patient around 4:30 pm). I will forward this message to [facility] to address on Monday. There is no trail in the patient's chart where Dr. Tenny Craw has filled this for. In trying to start the re-order, the computer will not let me do anything electronically with this script stating it is a historical med only. This will need to be called directly in to the patient's pharmacy. I wonder if Dr. Tenny Craw has refilled this directly off [facility] orders in the past.

## 2012-03-28 NOTE — Telephone Encounter (Signed)
Per Gina's request, I contacted Coretta at [facility]. Per Coretta, the patient will be able to get her 0.9 % NS, but her order is expiring today and she will need this over the weekend. I verified with Coretta what the order has been and sent in the renewal for - infusing 0.9% NS - 1 liter over 4 hours once weekly PRN. Dr. Antoine Poche signed for this in Dr. Tenny Craw' abscence. Order faxed to Coretta at [phone]. Confirmation received.

## 2012-03-28 NOTE — Telephone Encounter (Signed)
Follow up call   [facility] waiting on a new order .  Patient is aware that Dr. Tenny Craw is not in the office until Monday, she is also a patient of Dr. Graciela Husbands Aware that he & Geroge Baseman. Are in  the office today. Patient stated that he can sign the order.    Patient stated she wants to speak with nurse regarding this on going issues.

## 2012-03-28 NOTE — Telephone Encounter (Signed)
msg given to Doylene Bode RN to follow up.

## 2012-03-28 NOTE — Telephone Encounter (Signed)
Follow-up:    Patient called in to follow up on her intial call.  Please call back.

## 2012-03-31 ENCOUNTER — Other Ambulatory Visit: Payer: Self-pay | Admitting: Internal Medicine

## 2012-03-31 NOTE — Telephone Encounter (Signed)
B12  Injectable called to [facility] on [address] in [place] for 1 year (this is the pharmacy that has filled this in the past) I called patient and she is aware to pick this up today. IV fluid order will be signed by Dr.Ross today and faxed back to [facility].

## 2012-04-01 NOTE — Telephone Encounter (Signed)
Opened in Error.

## 2012-04-02 ENCOUNTER — Telehealth: Payer: Self-pay | Admitting: Internal Medicine

## 2012-04-02 NOTE — Telephone Encounter (Signed)
Patient has not been seen since 12/2010 and will need an office visit for prescriptions. Left message on machine.

## 2012-04-02 NOTE — Telephone Encounter (Signed)
Patient Information:  Caller Name: [name]  Phone: [phone]  Patient: Amanda, Barrera  Gender: Female  DOB: [birth year]  Age: 57 Years  PCP: Berniece Andreas [facility])  Pregnant: No  Office Follow Up:  Does the office need to follow up with this patient?: Yes  Instructions For The Office: OFFICE PT WANTS TO KNOW IF TAMIFLU CAN BE PRESCRIBED FOR HER.  PT USES [facility] OFF [address]  RN Note:  RN triaged patient per Flu-Like Symptoms Protocol.  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

## 2012-04-10 ENCOUNTER — Ambulatory Visit: Payer: BC Managed Care – PPO | Admitting: Internal Medicine

## 2012-04-18 ENCOUNTER — Encounter: Payer: BC Managed Care – PPO | Admitting: Internal Medicine

## 2012-04-18 ENCOUNTER — Encounter: Payer: Self-pay | Admitting: Internal Medicine

## 2012-04-18 DIAGNOSIS — Z0289 Encounter for other administrative examinations: Secondary | ICD-10-CM

## 2012-04-18 NOTE — Progress Notes (Signed)
Pt no show? For CPX ?

## 2012-04-21 ENCOUNTER — Telehealth: Payer: Self-pay | Admitting: Internal Medicine

## 2012-04-21 NOTE — Telephone Encounter (Signed)
Pt's appt moved to 04/25/2012 at 8:30am with Dr. Tenny Craw.

## 2012-04-21 NOTE — Telephone Encounter (Signed)
New problem   Patient calling inquiring about her appt that she has in April with Dr. Tenny Craw. Patient stated that this appt is not sooner enough due to her medication and question that she has . Offer an appt with PA patient refuse stating they will not know her history.     1. Discuss a referral for Neuro. Dr. Sandria Manly is retiring  in 3/17.    2. Upcoming surgery .

## 2012-04-25 ENCOUNTER — Ambulatory Visit (INDEPENDENT_AMBULATORY_CARE_PROVIDER_SITE_OTHER): Payer: BC Managed Care – PPO | Admitting: Internal Medicine

## 2012-04-25 ENCOUNTER — Encounter: Payer: Self-pay | Admitting: Internal Medicine

## 2012-04-25 VITALS — BP 131/85 | HR 69 | Ht 63.5 in | Wt 164.2 lb

## 2012-04-25 DIAGNOSIS — E785 Hyperlipidemia, unspecified: Secondary | ICD-10-CM

## 2012-04-25 DIAGNOSIS — G909 Disorder of the autonomic nervous system, unspecified: Secondary | ICD-10-CM

## 2012-04-28 NOTE — Progress Notes (Signed)
HPI Patient is a 57 yo with a history of autonomic dysfunction, atrial tachycardia including afib, CVA, diverticulosis, HL.   I follow her along with Odessa Fleming in cardiology her in [place].  She is also followed by B Grubb in [place]. She was seen by Odessa Fleming in the fall.  I saw her in clinic in December 2012. SHe is due to see B Berneice Gandy later this spring The patient says since her CVA she has not fully recoverd.  Still not with energy she had.   Followed by Ileene Rubens in Neuro.  Went through neuro rehab She denies signif tachycardia.  Is managing her dizziness  No syncope In winter does not receive IV fluids. Wants to get into an exercise routine again.  Allergies  Allergen Reactions  . Doxycycline   . Erythromycin   . Iohexol      Desc: pt. states allergic to dye,-did fine with pre-meds. 02/13/05 06/14/10...pt did not state allergy to contrast, no premeds given, pt tolerated contrast.   . Penicillins   . Sulfonamide Derivatives     Current Outpatient Prescriptions  Medication Sig Dispense Refill  . Cholecalciferol (VITAMIN D3) 50000 UNITS CAPS Take by mouth daily. 3 x weekly      . clonazePAM (KLONOPIN) 0.5 MG tablet Take 1 tablet (0.5 mg total) by mouth 2 (two) times daily as needed for anxiety.  30 tablet  5  . cloNIDine (CATAPRES - DOSED IN MG/24 HR) 0.2 mg/24hr patch Place 1 patch onto the skin once a week.      . cloNIDine (CATAPRES) 0.1 MG tablet Take 0.1 mg by mouth at bedtime.      . cloNIDine (CATAPRES) 0.1 MG tablet TAKE ONE TABLET BY MOUTH TWICE DAILY  180 tablet  3  . Cyanocobalamin (VITAMIN B-12 IJ) Inject as directed every 30 (thirty) days.        . hydrocortisone (ANUSOL-HC) 25 MG suppository Insert on rectally nightly x 12 then prn  12 suppository  2  . hydrocortisone-pramoxine (ANALPRAM-HC) 2.5-1 % rectal cream Apply BID prn to external hemorrhoids  30 g  1  . labetalol (NORMODYNE) 200 MG tablet Take 200 mg by mouth 2 (two) times daily.        . metoprolol (LOPRESSOR) 100 MG  tablet Take 1 tablet (100 mg total) by mouth 2 (two) times daily.  60 tablet  12  . metoprolol (LOPRESSOR) 50 MG tablet Take 1 tablet as needed for elevated blood pressure      . ondansetron (ZOFRAN) 8 MG tablet Take by mouth every 8 (eight) hours as needed.       . pantoprazole (PROTONIX) 40 MG tablet Take 40 mg by mouth as needed.       Marland Kitchen PHENobarbital (LUMINAL) 64.8 MG tablet Take 1 tablet (64.8 mg total) by mouth at bedtime.  90 tablet  3  . polyethylene glycol powder (GLYCOLAX/MIRALAX) powder Take 17 gm in 8 oz of water or juice 1-2 times a day for constipation  1054 g  2  . potassium chloride SA (K-DUR,KLOR-CON) 20 MEQ tablet 20 mEq. 4 by mouth once daily       . Rivaroxaban (XARELTO) 20 MG TABS Take 1 tablet (20 mg total) by mouth daily.  90 tablet  3   No current facility-administered medications for this visit.    Past Medical History  Diagnosis Date  . 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 Hypoglycemia, unspecified   . Other nonspecific  abnormal serum enzyme levels   . Cervicalgia   . Disturbance of skin sensation   . Other specified congenital anomalies of nervous system   . Swelling of limb   . Disorder of bone and cartilage, unspecified   . Atrial tachycardia   . CVA (cerebral infarction)     2012 with dizziness and vision change felt embolic from atrial tachy  . History of pacemaker   . POTS (postural orthostatic tachycardia syndrome)   . Diverticulosis   . Skin cancer   . Osteopenia   . DVT (deep venous thrombosis)   . Atrial fibrillation   . DM (diabetes mellitus)   . DVT (deep venous thrombosis)   . HLD (hyperlipidemia)   . HTN (hypertension)   . Pacemaker     autonomic dysfunction  . Complication of anesthesia   . PONV (postoperative nausea and vomiting)     Past Surgical History  Procedure Laterality Date  . Insert / replace / remove pacemaker  2012    1999, x 3  . Ablation saphenous vein w/ rfa      2002  . Tonsillectomy  and adenoidectomy    . Carpal tunnel release      left  . Nose surgery  2010, 2012    for nose bleeds x 2  . Fatty tumor removed      from chest  . Melanoma excision      with removal of lymph nodes, left shoulder  . Deep axillary sentinel node biopsy / excision      due to extensive Melanoma-right arm  . Laparoscopy  09/06/2011    Procedure: LAPAROSCOPY OPERATIVE;  Surgeon: Tresa Endo A. Ernestina Penna, MD;  Location: [facility];  Service: Gynecology;  Laterality: Left;  with Left Ovarian Cystectomy     Family History  Problem Relation Age of Onset  . Heart disease Father   . Diabetes Maternal Grandmother   . Diabetes Paternal Grandmother   . Stroke Mother   . Colon polyps Mother   . Clotting disorder Maternal Grandmother     stroke  . Crohn's disease Maternal Grandmother   . Diabetes Father   . Kidney disease Father     History   Social History  . Marital Status: Married    Spouse Name: N/A    Number of Children: 0  . Years of Education: N/A   Occupational History  . PRESIDENT     self employed   Social History Main Topics  . Smoking status: Never Smoker   . Smokeless tobacco: Never Used  . Alcohol Use: Yes     Comment: glass of wine daily  . Drug Use: No  . Sexually Active: Not on file   Other Topics Concern  . Not on file   Social History Narrative  . No narrative on file    Review of Systems:  All systems reviewed.  They are negative to the above problem except as previously stated.  Vital Signs: BP 131/85  Pulse 69  Ht 5' 3.5" (1.613 m)  Wt 164 lb 4 oz (74.503 kg)  BMI 28.64 kg/m2  Physical Exam Patient is in NAD HEENT:  Normocephalic, atraumatic. EOMI, PERRLA.  Neck: JVP is normal.  No bruits.  Lungs: clear to auscultation. No rales no wheezes.  Heart: Regular rate and rhythm. Normal S1, S2. No S3.   No significant murmurs. PMI not displaced.  Abdomen:  Supple, nontender. Normal bowel sounds. No masses. No hepatomegaly.  Extremities:   Good distal  pulses throughout. No lower extremity edema.  Musculoskeletal :moving all extremities.  Neuro:   alert and oriented x3.  CN II-XII grossly intact.   Assessment and Plan:  1.  Autonomic dysdfunction.  Patient is tolerating, denies signif dizziness.  Does feel out of shape since CVA  I agree that a gradual conditioning program should help her endurance. I would not change meds.  Told her to have records sent [facility]  2.  Atrial arrhythmias.  No signif spells.  Continue Xarelto.  Now with biotronik pacer which she says has helped. Will need to have xarelto held prior to eyelid surgery  3.  Neuro  Patient followed by Ileene Rubens  Will need continued followup once he retires.

## 2012-05-14 ENCOUNTER — Other Ambulatory Visit: Payer: Self-pay | Admitting: Internal Medicine

## 2012-05-22 ENCOUNTER — Telehealth: Payer: Self-pay | Admitting: Internal Medicine

## 2012-05-22 NOTE — Telephone Encounter (Signed)
Spoke with patient and she will call back in 15 minutes.

## 2012-05-22 NOTE — Telephone Encounter (Signed)
Patient states she is having problems with the Analpram cream. States when she exercises, her hemorrhoids start to bother her. When she uses the cream, it "breaks out my bottom." Patient thinks she is allergic to cream. Reviewed hemorrhoid care with patient. Scheduled OV with Doug Sou, PA on 05/27/12 at 1:30 PM.

## 2012-05-27 ENCOUNTER — Ambulatory Visit: Payer: BC Managed Care – PPO | Admitting: Gastroenterology

## 2012-06-05 ENCOUNTER — Other Ambulatory Visit: Payer: Self-pay | Admitting: *Deleted

## 2012-06-05 DIAGNOSIS — G901 Familial dysautonomia [Riley-Day]: Secondary | ICD-10-CM

## 2012-06-05 DIAGNOSIS — I639 Cerebral infarction, unspecified: Secondary | ICD-10-CM

## 2012-06-09 ENCOUNTER — Other Ambulatory Visit: Payer: Self-pay | Admitting: *Deleted

## 2012-06-09 DIAGNOSIS — R0602 Shortness of breath: Secondary | ICD-10-CM

## 2012-06-10 ENCOUNTER — Other Ambulatory Visit: Payer: Self-pay | Admitting: *Deleted

## 2012-06-10 MED ORDER — ROSUVASTATIN CALCIUM 5 MG PO TABS: 2.5000 mg | ORAL_TABLET | Freq: Every day | ORAL | Status: DC

## 2012-06-12 ENCOUNTER — Other Ambulatory Visit: Payer: Self-pay | Admitting: Internal Medicine

## 2012-06-17 ENCOUNTER — Encounter: Payer: Self-pay | Admitting: *Deleted

## 2012-06-17 ENCOUNTER — Ambulatory Visit (HOSPITAL_COMMUNITY): Payer: BC Managed Care – PPO | Attending: Internal Medicine | Admitting: Radiology

## 2012-06-17 DIAGNOSIS — R55 Syncope and collapse: Secondary | ICD-10-CM | POA: Insufficient documentation

## 2012-06-17 DIAGNOSIS — I4891 Unspecified atrial fibrillation: Secondary | ICD-10-CM | POA: Insufficient documentation

## 2012-06-17 DIAGNOSIS — E785 Hyperlipidemia, unspecified: Secondary | ICD-10-CM | POA: Insufficient documentation

## 2012-06-17 DIAGNOSIS — R0609 Other forms of dyspnea: Secondary | ICD-10-CM | POA: Insufficient documentation

## 2012-06-17 DIAGNOSIS — R0602 Shortness of breath: Secondary | ICD-10-CM

## 2012-06-17 DIAGNOSIS — I10 Essential (primary) hypertension: Secondary | ICD-10-CM | POA: Insufficient documentation

## 2012-06-17 DIAGNOSIS — E119 Type 2 diabetes mellitus without complications: Secondary | ICD-10-CM | POA: Insufficient documentation

## 2012-06-17 DIAGNOSIS — R002 Palpitations: Secondary | ICD-10-CM | POA: Insufficient documentation

## 2012-06-17 DIAGNOSIS — R42 Dizziness and giddiness: Secondary | ICD-10-CM | POA: Insufficient documentation

## 2012-06-17 DIAGNOSIS — I079 Rheumatic tricuspid valve disease, unspecified: Secondary | ICD-10-CM | POA: Insufficient documentation

## 2012-06-17 DIAGNOSIS — R0989 Other specified symptoms and signs involving the circulatory and respiratory systems: Secondary | ICD-10-CM | POA: Insufficient documentation

## 2012-06-17 DIAGNOSIS — I059 Rheumatic mitral valve disease, unspecified: Secondary | ICD-10-CM | POA: Insufficient documentation

## 2012-06-17 DIAGNOSIS — Z8673 Personal history of transient ischemic attack (TIA), and cerebral infarction without residual deficits: Secondary | ICD-10-CM | POA: Insufficient documentation

## 2012-06-17 DIAGNOSIS — Z86718 Personal history of other venous thrombosis and embolism: Secondary | ICD-10-CM | POA: Insufficient documentation

## 2012-06-17 DIAGNOSIS — I498 Other specified cardiac arrhythmias: Secondary | ICD-10-CM | POA: Insufficient documentation

## 2012-06-17 DIAGNOSIS — G9001 Carotid sinus syncope: Secondary | ICD-10-CM | POA: Insufficient documentation

## 2012-06-17 DIAGNOSIS — I379 Nonrheumatic pulmonary valve disorder, unspecified: Secondary | ICD-10-CM | POA: Insufficient documentation

## 2012-06-17 NOTE — Progress Notes (Signed)
Echocardiogram performed.  

## 2012-06-19 ENCOUNTER — Telehealth: Payer: Self-pay | Admitting: Internal Medicine

## 2012-06-19 NOTE — Telephone Encounter (Signed)
New problem    Pt calling for echo results

## 2012-06-19 NOTE — Telephone Encounter (Signed)
I left patient a message on answering machine today

## 2012-06-20 ENCOUNTER — Ambulatory Visit: Payer: BC Managed Care – PPO | Admitting: Internal Medicine

## 2012-07-02 ENCOUNTER — Telehealth: Payer: Self-pay | Admitting: Internal Medicine

## 2012-07-02 ENCOUNTER — Other Ambulatory Visit: Payer: Self-pay | Admitting: *Deleted

## 2012-07-02 MED ORDER — ROSUVASTATIN CALCIUM 5 MG PO TABS: 2.5000 mg | ORAL_TABLET | Freq: Every day | ORAL | Status: DC

## 2012-07-02 NOTE — Telephone Encounter (Signed)
New Prob      Pt requesting prescription of CRESTOR sent to [facility] on [address]. Pt is tolerating CRESTOR fine and down to last sample pack.

## 2012-07-02 NOTE — Telephone Encounter (Signed)
Sent to Pharmacy per pt request.

## 2012-07-03 ENCOUNTER — Ambulatory Visit: Payer: BC Managed Care – PPO | Admitting: Internal Medicine

## 2012-07-03 ENCOUNTER — Telehealth: Payer: Self-pay | Admitting: Internal Medicine

## 2012-07-03 MED ORDER — POLYETHYLENE GLYCOL 3350 17 GM/SCOOP PO POWD: ORAL | Status: DC

## 2012-07-03 NOTE — Telephone Encounter (Signed)
Rx sent 

## 2012-07-30 ENCOUNTER — Other Ambulatory Visit: Payer: Self-pay | Admitting: Internal Medicine

## 2012-07-31 ENCOUNTER — Telehealth: Payer: Self-pay | Admitting: Internal Medicine

## 2012-07-31 ENCOUNTER — Other Ambulatory Visit: Payer: Self-pay | Admitting: *Deleted

## 2012-07-31 MED ORDER — RIVAROXABAN 20 MG PO TABS: 20.0000 mg | ORAL_TABLET | Freq: Every day | ORAL | Status: DC

## 2012-07-31 MED ORDER — ROSUVASTATIN CALCIUM 5 MG PO TABS: 5.0000 mg | ORAL_TABLET | Freq: Every day | ORAL | Status: DC

## 2012-07-31 MED ORDER — CLONAZEPAM 0.5 MG PO TABS: 0.5000 mg | ORAL_TABLET | Freq: Two times a day (BID) | ORAL | Status: DC | PRN

## 2012-07-31 NOTE — Telephone Encounter (Signed)
Pt would like an Rx for a low dose testosterone cream thah can be applied to her leg.  This has been ok'd per Dr. Berneice Gandy.  Will consult with Weston Brass, Rph for dosing.

## 2012-07-31 NOTE — Telephone Encounter (Signed)
New Prob     Pt would like to speak to nurse regarding some prescriptions & something new she would like to try. Please call.

## 2012-07-31 NOTE — Telephone Encounter (Signed)
Literature received from [place].  Will forward to Dr. Tenny Craw.  Literature on Dr. Tenny Craw' cart.

## 2012-08-01 NOTE — Telephone Encounter (Signed)
Reviewed with S PUtt After researching am not in favor of testosterone  Left msg.

## 2012-08-05 ENCOUNTER — Other Ambulatory Visit: Payer: Self-pay | Admitting: Internal Medicine

## 2012-08-15 ENCOUNTER — Ambulatory Visit (INDEPENDENT_AMBULATORY_CARE_PROVIDER_SITE_OTHER): Payer: BC Managed Care – PPO | Admitting: Internal Medicine

## 2012-08-15 ENCOUNTER — Encounter: Payer: Self-pay | Admitting: Internal Medicine

## 2012-08-15 VITALS — BP 96/62 | HR 80 | Ht 63.0 in | Wt 160.5 lb

## 2012-08-15 DIAGNOSIS — K648 Other hemorrhoids: Secondary | ICD-10-CM

## 2012-08-15 MED ORDER — MESALAMINE 1000 MG RE SUPP: 1000.0000 mg | Freq: Every day | RECTAL | Status: DC

## 2012-08-15 NOTE — Patient Instructions (Addendum)
We have sent the following medications to your pharmacy for you to pick up at your convenience:Canasa. Dr Fabian Sharp, Dr Dietrich Pates

## 2012-08-15 NOTE — Progress Notes (Signed)
[name] 02/03/1956 MRN [id number]        History of Present Illness:  This is a 57 year old white female with POTS syndrome ,symptomatic diverticulosis and left lower quadrant abdominal pain which resolved after removal of a left ovarian mass last ,last episode of diverticulitis was in 2011, CT scan of the abdomen at that time did not show any inflammation and flexible sigmoidoscopy confirmed presence of diverticulosis. Last full colonoscopy was in June 2008 which showed moderately severe diverticulosis. Patient is intolerant of multiple medications including antispasmodics, she has recently had exacerbation of symptomatic hemorrhoids which she attributes to working out in a gym. She takes MiraLax 17 g daily for chronic constipation. She tried cortisone suppositories  but was allergic to the steroids.   Past Medical History  Diagnosis Date  . 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 Hypoglycemia, unspecified   . Other nonspecific abnormal serum enzyme levels   . Cervicalgia   . Disturbance of skin sensation   . Other specified congenital anomalies of nervous system   . Swelling of limb   . Disorder of bone and cartilage, unspecified   . Atrial tachycardia   . CVA (cerebral infarction)     2012 with dizziness and vision change felt embolic from atrial tachy  . History of pacemaker   . POTS (postural orthostatic tachycardia syndrome)   . Diverticulosis   . Skin cancer   . Osteopenia   . DVT (deep venous thrombosis)   . Atrial fibrillation   . DM (diabetes mellitus)   . DVT (deep venous thrombosis)   . HLD (hyperlipidemia)   . HTN (hypertension)   . Pacemaker     autonomic dysfunction  . Complication of anesthesia   . PONV (postoperative nausea and vomiting)    Past Surgical History  Procedure Laterality Date  . Insert / replace / remove pacemaker  2012    1999, x 3  . Ablation saphenous vein w/ rfa      2002  . Tonsillectomy and  adenoidectomy    . Carpal tunnel release      left  . Nose surgery  2010, 2012    for nose bleeds x 2  . Fatty tumor removed      from chest  . Melanoma excision      with removal of lymph nodes, left shoulder  . Deep axillary sentinel node biopsy / excision      due to extensive Melanoma-right arm  . Laparoscopy  09/06/2011    Procedure: LAPAROSCOPY OPERATIVE;  Surgeon: Tresa Endo A. Ernestina Penna, MD;  Location: [facility];  Service: Gynecology;  Laterality: Left;  with Left Ovarian Cystectomy     reports that she has never smoked. She has never used smokeless tobacco. She reports that  drinks alcohol. She reports that she does not use illicit drugs. family history includes Clotting disorder in her maternal grandmother; Colon polyps in her mother; Crohn's disease in her maternal grandmother; Diabetes in her father, maternal grandmother, and paternal grandmother; Heart disease in her father; Kidney disease in her father; and Stroke in her mother. Allergies  Allergen Reactions  . Doxycycline   . Erythromycin   . Hydrocortisone   . Iohexol      Desc: pt. states allergic to dye,-did fine with pre-meds. 02/13/05 06/14/10...pt did not state allergy to contrast, no premeds given, pt tolerated contrast.   . Penicillins   . Preparation H (Pramox-Pe-Glycerin-Petrolatum)     No Cream or  Suppositories   . Sulfonamide Derivatives         Review of Systems: Denies abdominal pain  The remainder of the 10 point ROS is negative except as outlined in H&P   Physical Exam: General appearance  Well developed, in no distress. Eyes- non icteric. HEENT nontraumatic, normocephalic. Mouth no lesions, tongue papillated, no cheilosis. Neck supple without adenopathy, thyroid not enlarged, no carotid bruits, no JVD. Lungs Clear to auscultation bilaterally. Cor normal S1, normal S2, regular rhythm, no murmur,  quiet precordium. Abdomen: Soft nontender abdomen with normal active bowel sounds Rectal: Not  done Extremities no pedal edema. Skin no lesions. Neurological alert and oriented x 3. Psychological normal mood and affect.  Assessment and Plan:  57 year old white female with a symptomatic hemorrhoids sensitive to topical steroids. I have given her samples of Calmoseptine appointment to use topically externally and a prescription for Canasa suppositories to use at bedtime. She is up-to-date on her colonoscopy. She needs to continue MiraLax for chronic constipation, recall colonoscopy is June 2018   08/15/2012 [name]

## 2012-08-16 ENCOUNTER — Encounter: Payer: Self-pay | Admitting: Internal Medicine

## 2012-08-19 ENCOUNTER — Telehealth: Payer: Self-pay | Admitting: *Deleted

## 2012-09-02 ENCOUNTER — Other Ambulatory Visit: Payer: Self-pay | Admitting: *Deleted

## 2012-09-02 MED ORDER — POLYETHYLENE GLYCOL 3350 17 GM/SCOOP PO POWD: ORAL | Status: DC

## 2012-09-07 ENCOUNTER — Encounter (HOSPITAL_BASED_OUTPATIENT_CLINIC_OR_DEPARTMENT_OTHER): Payer: Self-pay

## 2012-09-07 ENCOUNTER — Emergency Department (HOSPITAL_BASED_OUTPATIENT_CLINIC_OR_DEPARTMENT_OTHER): Payer: BC Managed Care – PPO

## 2012-09-07 ENCOUNTER — Emergency Department (HOSPITAL_BASED_OUTPATIENT_CLINIC_OR_DEPARTMENT_OTHER)
Admission: EM | Admit: 2012-09-07 | Discharge: 2012-09-07 | Disposition: A | Discharge status: Home / Self Care | Payer: BC Managed Care – PPO | Attending: Emergency Medicine | Admitting: Emergency Medicine

## 2012-09-07 DIAGNOSIS — I498 Other specified cardiac arrhythmias: Secondary | ICD-10-CM | POA: Insufficient documentation

## 2012-09-07 DIAGNOSIS — Z79899 Other long term (current) drug therapy: Secondary | ICD-10-CM | POA: Insufficient documentation

## 2012-09-07 DIAGNOSIS — Z950 Presence of cardiac pacemaker: Secondary | ICD-10-CM | POA: Insufficient documentation

## 2012-09-07 DIAGNOSIS — Z862 Personal history of diseases of the blood and blood-forming organs and certain disorders involving the immune mechanism: Secondary | ICD-10-CM | POA: Insufficient documentation

## 2012-09-07 DIAGNOSIS — G9389 Other specified disorders of brain: Secondary | ICD-10-CM | POA: Insufficient documentation

## 2012-09-07 DIAGNOSIS — E1169 Type 2 diabetes mellitus with other specified complication: Secondary | ICD-10-CM | POA: Insufficient documentation

## 2012-09-07 DIAGNOSIS — Z880 Allergy status to penicillin: Secondary | ICD-10-CM | POA: Insufficient documentation

## 2012-09-07 DIAGNOSIS — Z87828 Personal history of other (healed) physical injury and trauma: Secondary | ICD-10-CM | POA: Insufficient documentation

## 2012-09-07 DIAGNOSIS — G939 Disorder of brain, unspecified: Secondary | ICD-10-CM

## 2012-09-07 DIAGNOSIS — Z8739 Personal history of other diseases of the musculoskeletal system and connective tissue: Secondary | ICD-10-CM | POA: Insufficient documentation

## 2012-09-07 DIAGNOSIS — Z85828 Personal history of other malignant neoplasm of skin: Secondary | ICD-10-CM | POA: Insufficient documentation

## 2012-09-07 DIAGNOSIS — Z8719 Personal history of other diseases of the digestive system: Secondary | ICD-10-CM | POA: Insufficient documentation

## 2012-09-07 DIAGNOSIS — Z8639 Personal history of other endocrine, nutritional and metabolic disease: Secondary | ICD-10-CM | POA: Insufficient documentation

## 2012-09-07 DIAGNOSIS — Z8679 Personal history of other diseases of the circulatory system: Secondary | ICD-10-CM | POA: Insufficient documentation

## 2012-09-07 DIAGNOSIS — Z87798 Personal history of other (corrected) congenital malformations: Secondary | ICD-10-CM | POA: Insufficient documentation

## 2012-09-07 DIAGNOSIS — Z8669 Personal history of other diseases of the nervous system and sense organs: Secondary | ICD-10-CM | POA: Insufficient documentation

## 2012-09-07 DIAGNOSIS — E785 Hyperlipidemia, unspecified: Secondary | ICD-10-CM | POA: Insufficient documentation

## 2012-09-07 DIAGNOSIS — I4891 Unspecified atrial fibrillation: Secondary | ICD-10-CM | POA: Insufficient documentation

## 2012-09-07 DIAGNOSIS — I10 Essential (primary) hypertension: Secondary | ICD-10-CM | POA: Insufficient documentation

## 2012-09-07 MED ORDER — ACETAMINOPHEN 500 MG PO TABS
1000.0000 mg | ORAL_TABLET | Freq: Once | ORAL | Status: AC
  Administered 2012-09-07: 1000 mg via ORAL
  Filled 2012-09-07: qty 2

## 2012-09-07 NOTE — ED Notes (Signed)
Patient back from CT.

## 2012-09-07 NOTE — ED Notes (Addendum)
Lab technician called this Rn to inform that blood specimen had hemolyzed for BMP lab draw- states everything but K+ (K+ would be affected) would result. Dr. Fredderick Phenix made aware.

## 2012-09-07 NOTE — ED Notes (Signed)
MD at bedside. 

## 2012-09-07 NOTE — ED Provider Notes (Signed)
History    CSN: [id number] Arrival date & time 09/07/12  1207  First MD Initiated Contact with Patient 09/07/12 1232     Chief Complaint  Patient presents with  . Headache   (Consider location/radiation/quality/duration/timing/severity/associated sxs/prior Treatment) HPI Comments: Patient presents with a left-sided headache. She states it's been gone on for the last week. She describes it as an achy pain in her left ear it goes down to the left side of her neck. She states she's been using Tylenol which seems to relieve the pain. She states one day during the week she went up to [place] where she was seen a cardiologist there and slept on a different hello and she said during the day than the headache was essentially gone. When she came back to [place] returns. She denies any numbness or weakness in her extremities. She denies any speech deficits or facial numbness. She denies any vision changes. She has a history of a CVA in 2012 but denies any symptoms similar to that episode. She denies any residual defects from the past CVA. She is on Xarelto. She denies any recent head trauma. She denies any history of similar headaches in the past. She denies he fevers or neck stiffness. She states her sister died of a ruptured cerebral aneurysm but she has had a CTA in the past that did not show any aneurysms. She states the headache is worse with certain positions if she bends over or she turns her head to the left.  Patient is a 57 y.o. female presenting with headaches.  Headache Associated symptoms: no abdominal pain, no back pain, no congestion, no cough, no diarrhea, no dizziness, no fatigue, no fever, no nausea, no numbness and no vomiting    Past Medical History  Diagnosis Date  . 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 Hypoglycemia, unspecified   . Other nonspecific abnormal serum enzyme levels   . Cervicalgia   . Disturbance of skin sensation   . Other  specified congenital anomalies of nervous system   . Swelling of limb   . Disorder of bone and cartilage, unspecified   . Atrial tachycardia   . CVA (cerebral infarction)     2012 with dizziness and vision change felt embolic from atrial tachy  . History of pacemaker   . POTS (postural orthostatic tachycardia syndrome)   . Diverticulosis   . Skin cancer   . Osteopenia   . DVT (deep venous thrombosis)   . Atrial fibrillation   . DM (diabetes mellitus)   . DVT (deep venous thrombosis)   . HLD (hyperlipidemia)   . HTN (hypertension)   . Pacemaker     autonomic dysfunction  . Complication of anesthesia   . PONV (postoperative nausea and vomiting)    Past Surgical History  Procedure Laterality Date  . Insert / replace / remove pacemaker  2012    1999, x 3  . Ablation saphenous vein w/ rfa      2002  . Tonsillectomy and adenoidectomy    . Carpal tunnel release      left  . Nose surgery  2010, 2012    for nose bleeds x 2  . Fatty tumor removed      from chest  . Melanoma excision      with removal of lymph nodes, left shoulder  . Deep axillary sentinel node biopsy / excision      due to extensive Melanoma-right arm  .  Laparoscopy  09/06/2011    Procedure: LAPAROSCOPY OPERATIVE;  Surgeon: Tresa Endo A. Ernestina Penna, MD;  Location: [facility];  Service: Gynecology;  Laterality: Left;  with Left Ovarian Cystectomy    Family History  Problem Relation Age of Onset  . Heart disease Father   . Diabetes Maternal Grandmother   . Diabetes Paternal Grandmother   . Stroke Mother   . Colon polyps Mother   . Clotting disorder Maternal Grandmother     stroke  . Crohn's disease Maternal Grandmother   . Diabetes Father   . Kidney disease Father    History  Substance Use Topics  . Smoking status: Never Smoker   . Smokeless tobacco: Never Used  . Alcohol Use: Yes     Comment: glass of wine daily   OB History   Grav Para Term Preterm Abortions TAB SAB Ect Mult Living                 Review  of Systems  Constitutional: Negative for fever, chills, diaphoresis and fatigue.  HENT: Negative for congestion, rhinorrhea and sneezing.   Eyes: Negative.   Respiratory: Negative for cough, chest tightness and shortness of breath.   Cardiovascular: Negative for chest pain and leg swelling.  Gastrointestinal: Negative for nausea, vomiting, abdominal pain, diarrhea and blood in stool.  Genitourinary: Negative for frequency, hematuria, flank pain and difficulty urinating.  Musculoskeletal: Negative for back pain and arthralgias.  Skin: Negative for rash.  Neurological: Positive for headaches. Negative for dizziness, speech difficulty, weakness and numbness.    Allergies  Doxycycline; Erythromycin; Hydrocortisone; Iohexol; Penicillins; Preparation h; and Sulfonamide derivatives  Home Medications   Current Outpatient Rx  Name  Route  Sig  Dispense  Refill  . Cholecalciferol (VITAMIN D3) 50000 UNITS CAPS   Oral   Take by mouth daily. 3 x weekly         . clonazePAM (KLONOPIN) 0.5 MG tablet   Oral   Take 1 tablet (0.5 mg total) by mouth 2 (two) times daily as needed for anxiety.   180 tablet   3   . cloNIDine (CATAPRES - DOSED IN MG/24 HR) 0.2 mg/24hr patch   Transdermal   Place 1 patch onto the skin once a week.         . cloNIDine (CATAPRES) 0.1 MG tablet               . Cyanocobalamin (VITAMIN B-12 IJ)   Injection   Inject as directed every 30 (thirty) days.           . hydrocortisone (ANUSOL-HC) 25 MG suppository      Insert on rectally nightly x 12 then prn   12 suppository   2   . hydrocortisone-pramoxine (ANALPRAM-HC) 2.5-1 % rectal cream      Apply BID prn to external hemorrhoids   30 g   1   . labetalol (NORMODYNE) 200 MG tablet   Oral   Take 200 mg by mouth 2 (two) times daily.           . mesalamine (CANASA) 1000 MG suppository   Rectal   Place 1 suppository (1,000 mg total) rectally at bedtime.   30 suppository   1   . metoprolol  (LOPRESSOR) 100 MG tablet   Oral   Take 1 tablet (100 mg total) by mouth 2 (two) times daily.   60 tablet   12   . metoprolol (LOPRESSOR) 50 MG tablet      TAKE 1 TABLETS  BY MOUTH AS NEEDED FOR ELEVATED BLOOD PRESSURE   30 tablet   2   . ondansetron (ZOFRAN) 8 MG tablet   Oral   Take by mouth every 8 (eight) hours as needed.          . pantoprazole (PROTONIX) 40 MG tablet   Oral   Take 40 mg by mouth as needed.          Marland Kitchen PHENobarbital (LUMINAL) 64.8 MG tablet   Oral   Take 1 tablet (64.8 mg total) by mouth at bedtime.   90 tablet   3   . polyethylene glycol powder (GLYCOLAX/MIRALAX) powder      STIR 17GM IN 8OZ OF WATER OR JUICE 1 TO 2 TIMES DAILY FOR CONSTIPATION   1054 g   2   . potassium chloride SA (K-DUR,KLOR-CON) 20 MEQ tablet      20 mEq. 4 by mouth once daily          . rosuvastatin (CRESTOR) 5 MG tablet   Oral   Take 1 tablet (5 mg total) by mouth daily.   90 tablet   3   . XARELTO 20 MG TABS      TAKE ONE TABLET BY MOUTH ONE TIME DAILY   90 tablet   3    BP 141/80  Pulse 70  Temp(Src) 98 F (36.7 C) (Oral)  Resp 18  Ht 5\' 3"  (1.6 m)  Wt 158 lb (71.668 kg)  BMI 28 kg/m2  SpO2 100% Physical Exam  Constitutional: She is oriented to person, place, and time. She appears well-developed and well-nourished.  HENT:  Head: Normocephalic and atraumatic.  Eyes: Pupils are equal, round, and reactive to light.  Neck: Normal range of motion. Neck supple.  Patient has some reproducible tenderness at the left upper cervical paraspinal area  Cardiovascular: Normal rate, regular rhythm and normal heart sounds.   Pulmonary/Chest: Effort normal and breath sounds normal. No respiratory distress. She has no wheezes. She has no rales. She exhibits no tenderness.  Abdominal: Soft. Bowel sounds are normal. There is no tenderness. There is no rebound and no guarding.  Musculoskeletal: Normal range of motion. She exhibits no edema.  Lymphadenopathy:    She  has no cervical adenopathy.  Neurological: She is alert and oriented to person, place, and time. She has normal strength. No cranial nerve deficit or sensory deficit. GCS eye subscore is 4. GCS verbal subscore is 5. GCS motor subscore is 6.  Skin: Skin is warm and dry. No rash noted.  Psychiatric: She has a normal mood and affect.    ED Course  Procedures (including critical care time)  Dg Chest 2 View  09/07/2012   *RADIOLOGY REPORT*  Clinical Data: Rule out metastatic disease.  Melanoma  CHEST - 2 VIEW  Comparison: 01/07/2012  Findings: Dual lead pacemaker unchanged.  Heart size is normal. Negative for heart failure.  Negative for infiltrate effusion or mass.  No lung nodule.  No change from prior study.  IMPRESSION: No acute abnormality.   Original Report Authenticated By: Janeece Riggers, M.D.   Ct Head Wo Contrast  09/07/2012   *RADIOLOGY REPORT*  Clinical Data: Headache.  History of melanoma  CT HEAD WITHOUT CONTRAST  Technique:  Contiguous axial images were obtained from the base of the skull through the vertex without contrast.  Comparison: CT head 06/14/2010  Findings: Interval development of a cystic mass in the left cerebellum measuring 20 x 27 mm.  Minimal surrounding white matter edema.  Mild local mass effect.  No other mass lesions are identified.  Ventricle size is normal.  Negative for acute infarct.  Negative for hemorrhage.  Calvarium is intact.  IMPRESSION: 20 x 27 mm cystic mass in the left cerebellum, not present on 06/14/2010.  This may represent a cystic neoplasm such as metastatic disease.  Further evaluation with MRI without and with contrast is suggested.   Original Report Authenticated By: Janeece Riggers, M.D.        1. Brain lesion     MDM  Patient presents with headache and on CT there is a new brain lesion. Her headaches are fairly mild and controlled with Tylenol currently. She has no other associated symptoms. I was going to do further imaging studies to further  characterize the lesion however she cannot have an MRI given her pacemaker and she apparently has had an allergy to IV dye in the past so we cannot do a CT scan with contrast without premedication the patient. However she is also allergic to steroids so we are not able to premedicate her. Given this we were unable to further imaging studies today. I discussed the case with Dr. Cheree Ditto with neurosurgery who recommends doing a chest x-ray here and they can do further imaging studies as an outpatient. I instructed the patient that important to have close followup with a neurosurgeon. She was instructed to return to the ED if she has worsening headaches vomiting dizziness or other worsening symptoms.  Rolan Bucco, MD 09/07/12 [phone]

## 2012-09-07 NOTE — ED Notes (Signed)
Given a gingerale and cup of ice

## 2012-09-07 NOTE — ED Notes (Signed)
Patient transported to CT 

## 2012-09-07 NOTE — ED Notes (Signed)
Pt reports a headache x 1 week.

## 2012-09-08 DIAGNOSIS — G939 Disorder of brain, unspecified: Secondary | ICD-10-CM | POA: Insufficient documentation

## 2012-09-10 ENCOUNTER — Other Ambulatory Visit (HOSPITAL_COMMUNITY): Payer: Self-pay | Admitting: Neurological Surgery

## 2012-09-10 DIAGNOSIS — G9389 Other specified disorders of brain: Secondary | ICD-10-CM

## 2012-09-11 ENCOUNTER — Ambulatory Visit (HOSPITAL_COMMUNITY)
Admission: RE | Admit: 2012-09-11 | Discharge: 2012-09-11 | Disposition: A | Discharge status: Home / Self Care | Payer: BC Managed Care – PPO | Source: Ambulatory Visit | Attending: Neurological Surgery | Admitting: Neurological Surgery

## 2012-09-11 DIAGNOSIS — Z950 Presence of cardiac pacemaker: Secondary | ICD-10-CM | POA: Insufficient documentation

## 2012-09-11 DIAGNOSIS — I701 Atherosclerosis of renal artery: Secondary | ICD-10-CM | POA: Insufficient documentation

## 2012-09-11 DIAGNOSIS — G9389 Other specified disorders of brain: Secondary | ICD-10-CM

## 2012-09-11 DIAGNOSIS — R51 Headache: Secondary | ICD-10-CM | POA: Insufficient documentation

## 2012-09-11 DIAGNOSIS — R911 Solitary pulmonary nodule: Secondary | ICD-10-CM | POA: Insufficient documentation

## 2012-09-11 DIAGNOSIS — K5730 Diverticulosis of large intestine without perforation or abscess without bleeding: Secondary | ICD-10-CM | POA: Insufficient documentation

## 2012-09-11 MED ORDER — IOHEXOL 300 MG/ML  SOLN
100.0000 mL | Freq: Once | INTRAMUSCULAR | Status: AC | PRN
  Administered 2012-09-11: 100 mL via INTRAVENOUS

## 2012-09-12 ENCOUNTER — Ambulatory Visit (INDEPENDENT_AMBULATORY_CARE_PROVIDER_SITE_OTHER): Payer: BC Managed Care – PPO | Admitting: *Deleted

## 2012-09-12 DIAGNOSIS — I4891 Unspecified atrial fibrillation: Secondary | ICD-10-CM

## 2012-09-12 LAB — PACEMAKER DEVICE OBSERVATION
BAMS-0001: 160 {beats}/min
BAMS-0002: 4 ms
DEVICE MODEL PM: 66195584

## 2012-09-12 NOTE — Progress Notes (Signed)
Pacemaker interrogation only for AF episodes. No AF episodes since 03-15-12 lasting 2 seconds. Pt scheduled for surgery on 09-29-12 and has concerns to stopping Xarelto as soon as possible. Dr Graciela Husbands spoke in length with pt about this.

## 2012-09-17 ENCOUNTER — Telehealth (INDEPENDENT_AMBULATORY_CARE_PROVIDER_SITE_OTHER): Payer: Self-pay | Admitting: General Surgery

## 2012-09-17 ENCOUNTER — Telehealth (INDEPENDENT_AMBULATORY_CARE_PROVIDER_SITE_OTHER): Payer: Self-pay | Admitting: Surgery

## 2012-09-17 ENCOUNTER — Other Ambulatory Visit (INDEPENDENT_AMBULATORY_CARE_PROVIDER_SITE_OTHER): Payer: Self-pay

## 2012-09-17 ENCOUNTER — Other Ambulatory Visit: Payer: Self-pay | Admitting: Radiation Therapy

## 2012-09-17 DIAGNOSIS — C439 Malignant melanoma of skin, unspecified: Secondary | ICD-10-CM

## 2012-09-17 NOTE — Telephone Encounter (Signed)
I had spoken with [name] last Sunday and she requested that I review her xrays and discuss her upcoming brain surgery with Dr. Mancel Bale.  We discussed her and the possible need for a PET scan preop to look for evidence of metastatic melanoma (right shoulder 2009).  She has had a CT scan of her head (showing 2 lesions), her chest (showing 2 lesions described a suspicious-3-8 mm), and an abdominal CT that did not show any solid visceral lesions.   Her brain surgery is scheduled for August 8.  After discussing the findings with her she would like to proceed with a PET scan and I will have my office arrange this.

## 2012-09-17 NOTE — Telephone Encounter (Signed)
Spoke with pt and informed her that Dr. Daphine Deutscher had ordered a PET scan for her.  It is scheduled on 09/22/12 at 1:45 at [facility] radiology.  Informed her NPO after 8:00 am that morning.

## 2012-09-22 ENCOUNTER — Other Ambulatory Visit: Payer: Self-pay | Admitting: *Deleted

## 2012-09-22 ENCOUNTER — Encounter (HOSPITAL_COMMUNITY): Payer: Self-pay | Admitting: Pharmacy Technician

## 2012-09-22 ENCOUNTER — Ambulatory Visit (HOSPITAL_COMMUNITY)
Admission: RE | Admit: 2012-09-22 | Discharge: 2012-09-22 | Disposition: A | Discharge status: Home / Self Care | Payer: BC Managed Care – PPO | Source: Ambulatory Visit | Attending: Surgery | Admitting: Surgery

## 2012-09-22 DIAGNOSIS — C4360 Malignant melanoma of unspecified upper limb, including shoulder: Secondary | ICD-10-CM | POA: Insufficient documentation

## 2012-09-22 DIAGNOSIS — C439 Malignant melanoma of skin, unspecified: Secondary | ICD-10-CM

## 2012-09-22 DIAGNOSIS — R918 Other nonspecific abnormal finding of lung field: Secondary | ICD-10-CM | POA: Insufficient documentation

## 2012-09-22 DIAGNOSIS — G9389 Other specified disorders of brain: Secondary | ICD-10-CM | POA: Insufficient documentation

## 2012-09-22 LAB — GLUCOSE, CAPILLARY: Glucose-Capillary: 110 mg/dL — ABNORMAL HIGH (ref 70–99)

## 2012-09-22 MED ORDER — FLUDEOXYGLUCOSE F - 18 (FDG) INJECTION
17.2000 | Freq: Once | INTRAVENOUS | Status: AC | PRN
  Administered 2012-09-22: 17.2 via INTRAVENOUS

## 2012-09-22 MED ORDER — PHENOBARBITAL 64.8 MG PO TABS: 64.8000 mg | ORAL_TABLET | Freq: Every day | ORAL | Status: DC

## 2012-09-25 ENCOUNTER — Other Ambulatory Visit: Payer: Self-pay | Admitting: Neurological Surgery

## 2012-09-29 ENCOUNTER — Encounter (HOSPITAL_COMMUNITY)
Admission: RE | Admit: 2012-09-29 | Discharge: 2012-09-29 | Disposition: A | Discharge status: Home / Self Care | Payer: BC Managed Care – PPO | Source: Ambulatory Visit | Attending: Neurological Surgery | Admitting: Neurological Surgery

## 2012-09-29 ENCOUNTER — Encounter (HOSPITAL_COMMUNITY): Payer: Self-pay

## 2012-09-29 HISTORY — DX: Cerebral infarction, unspecified: I63.9

## 2012-09-29 LAB — BASIC METABOLIC PANEL
BUN: 19 mg/dL (ref 6–23)
CO2: 26 mEq/L (ref 19–32)
Calcium: 9.6 mg/dL (ref 8.4–10.5)
Chloride: 100 mEq/L (ref 96–112)
Creatinine, Ser: 0.76 mg/dL (ref 0.50–1.10)
GFR calc Af Amer: >90 mL/min (ref >90)
GFR calc non Af Amer: >90 mL/min (ref >90)
Glucose, Bld: 116 mg/dL — ABNORMAL HIGH (ref 70–99)
Potassium: 3.9 mEq/L (ref 3.5–5.1)
Sodium: 137 mEq/L (ref 135–145)

## 2012-09-29 LAB — CBC
HCT: 42.4 % (ref 36.0–46.0)
Hemoglobin: 14.4 g/dL (ref 12.0–15.0)
MCH: 30.4 pg (ref 26.0–34.0)
MCHC: 34 g/dL (ref 30.0–36.0)
MCV: 89.5 fL (ref 78.0–100.0)
Platelets: 277 10*3/uL (ref 150–400)
RBC: 4.74 MIL/uL (ref 3.87–5.11)
RDW: 13.6 % (ref 11.5–15.5)
WBC: 8.8 10*3/uL (ref 4.0–10.5)

## 2012-09-29 LAB — TYPE AND SCREEN
ABO/RH(D): O POS
Antibody Screen: NEGATIVE

## 2012-09-29 LAB — SURGICAL PCR SCREEN
MRSA, PCR: NEGATIVE
Staphylococcus aureus: POSITIVE — AB

## 2012-09-29 LAB — ABO/RH: ABO/RH(D): O POS

## 2012-09-29 MED ORDER — VANCOMYCIN HCL IN DEXTROSE 1-5 GM/200ML-% IV SOLN
1000.0000 mg | INTRAVENOUS | Status: AC
  Administered 2012-09-30: 1000 mg via INTRAVENOUS
  Filled 2012-09-29: qty 200

## 2012-09-29 NOTE — Progress Notes (Signed)
Clarified with Christine in device clinic that post op interrogation not needed.

## 2012-09-29 NOTE — Pre-Procedure Instructions (Signed)
Karmyn Lowman Koepke  09/29/2012   Your procedure is scheduled on:  Tuesday September 30, 2012.  Report to [facility] at 7:30 AM.  Call this number if you have problems the morning of surgery: [phone]   Remember:   Do not eat food or drink liquids after midnight.   Take these medicines the morning of surgery with A SIP OF WATER: Acetaminophen (Tylenol) if needed for pain, Clonazepam (Klonopin), Metoprolol (Lopressor), Pantoprazole (Protonix)   Do not wear jewelry, make-up or nail polish.  Do not wear lotions, powders, or perfumes. You may wear deodorant.  Do not shave 48 hours prior to surgery.  Do not bring valuables to the hospital.  [facility] is not responsible for any belongings or valuables.  Contacts, dentures or bridgework may not be worn into surgery.  Leave suitcase in the car. After surgery it may be brought to your room.  For patients admitted to the hospital, checkout time is 11:00 AM the day of discharge.   Patients discharged the day of surgery will not be allowed to drive home.  Name and phone number of your driver: Family/Friend  Special Instructions: Shower using CHG tonight (09/29/12) and tomorrow morning (09/30/12) before surgery.  If you shower the day of surgery use CHG only.  Use special wash - you have one bottle of CHG for all showers.  You should use approximately 1/3 of the bottle for each shower.   Please read over the following fact sheets that you were given: Pain Booklet, Coughing and Deep Breathing, Blood Transfusion Information, MRSA Information and Surgical Site Infection Prevention

## 2012-09-29 NOTE — Progress Notes (Signed)
Holy Link, [employer] rep, notified of ICD order received.

## 2012-09-30 ENCOUNTER — Encounter (HOSPITAL_COMMUNITY): Payer: Self-pay | Admitting: Certified Registered Nurse Anesthetist

## 2012-09-30 ENCOUNTER — Inpatient Hospital Stay (HOSPITAL_COMMUNITY)
Admission: RE | Admit: 2012-09-30 | Discharge: 2012-10-04 | DRG: 001 | Disposition: A | Discharge status: Home / Self Care | Payer: BC Managed Care – PPO | Source: Ambulatory Visit | Attending: Neurological Surgery | Admitting: Neurological Surgery

## 2012-09-30 ENCOUNTER — Encounter (HOSPITAL_COMMUNITY): Payer: Self-pay | Admitting: *Deleted

## 2012-09-30 ENCOUNTER — Ambulatory Visit (HOSPITAL_COMMUNITY): Payer: BC Managed Care – PPO | Admitting: Certified Registered Nurse Anesthetist

## 2012-09-30 ENCOUNTER — Encounter (HOSPITAL_COMMUNITY)
Admission: RE | Disposition: A | Discharge status: Home / Self Care | Payer: Self-pay | Source: Ambulatory Visit | Attending: Neurological Surgery

## 2012-09-30 ENCOUNTER — Ambulatory Visit (HOSPITAL_COMMUNITY): Payer: BC Managed Care – PPO

## 2012-09-30 DIAGNOSIS — G9389 Other specified disorders of brain: Secondary | ICD-10-CM

## 2012-09-30 DIAGNOSIS — E785 Hyperlipidemia, unspecified: Secondary | ICD-10-CM | POA: Diagnosis present

## 2012-09-30 DIAGNOSIS — I498 Other specified cardiac arrhythmias: Secondary | ICD-10-CM | POA: Diagnosis present

## 2012-09-30 DIAGNOSIS — Z8673 Personal history of transient ischemic attack (TIA), and cerebral infarction without residual deficits: Secondary | ICD-10-CM

## 2012-09-30 DIAGNOSIS — I10 Essential (primary) hypertension: Secondary | ICD-10-CM

## 2012-09-30 DIAGNOSIS — I639 Cerebral infarction, unspecified: Secondary | ICD-10-CM

## 2012-09-30 DIAGNOSIS — Z79899 Other long term (current) drug therapy: Secondary | ICD-10-CM

## 2012-09-30 DIAGNOSIS — G909 Disorder of the autonomic nervous system, unspecified: Secondary | ICD-10-CM

## 2012-09-30 DIAGNOSIS — F411 Generalized anxiety disorder: Secondary | ICD-10-CM

## 2012-09-30 DIAGNOSIS — Z7901 Long term (current) use of anticoagulants: Secondary | ICD-10-CM

## 2012-09-30 DIAGNOSIS — C7931 Secondary malignant neoplasm of brain: Principal | ICD-10-CM | POA: Diagnosis present

## 2012-09-30 DIAGNOSIS — Z85820 Personal history of malignant melanoma of skin: Secondary | ICD-10-CM

## 2012-09-30 DIAGNOSIS — Z86718 Personal history of other venous thrombosis and embolism: Secondary | ICD-10-CM

## 2012-09-30 DIAGNOSIS — G901 Familial dysautonomia [Riley-Day]: Secondary | ICD-10-CM

## 2012-09-30 DIAGNOSIS — I4891 Unspecified atrial fibrillation: Secondary | ICD-10-CM

## 2012-09-30 DIAGNOSIS — Z950 Presence of cardiac pacemaker: Secondary | ICD-10-CM

## 2012-09-30 DIAGNOSIS — R0902 Hypoxemia: Secondary | ICD-10-CM

## 2012-09-30 HISTORY — PX: CRANIOTOMY: SHX93

## 2012-09-30 LAB — GLUCOSE, CAPILLARY
Glucose-Capillary: 85 mg/dL (ref 70–99)
Glucose-Capillary: 126 mg/dL — ABNORMAL HIGH (ref 70–99)

## 2012-09-30 SURGERY — CRANIOTOMY TUMOR EXCISION
Anesthesia: General | Site: Head | Wound class: Clean

## 2012-09-30 MED ORDER — LABETALOL HCL 5 MG/ML IV SOLN: 10.0000 mg | INTRAVENOUS | Status: DC | PRN

## 2012-09-30 MED ORDER — NEOSTIGMINE METHYLSULFATE 1 MG/ML IJ SOLN
INTRAMUSCULAR | Status: DC | PRN
  Administered 2012-09-30: 3 mg via INTRAVENOUS

## 2012-09-30 MED ORDER — GLYCOPYRROLATE 0.2 MG/ML IJ SOLN
INTRAMUSCULAR | Status: DC | PRN
  Administered 2012-09-30: 0.4 mg via INTRAVENOUS

## 2012-09-30 MED ORDER — ATORVASTATIN CALCIUM 10 MG PO TABS
10.0000 mg | ORAL_TABLET | Freq: Every day | ORAL | Status: DC
  Administered 2012-09-30 – 2012-10-03 (×4): 10 mg via ORAL
  Filled 2012-09-30 (×5): qty 1

## 2012-09-30 MED ORDER — PNEUMOCOCCAL VAC POLYVALENT 25 MCG/0.5ML IJ INJ
0.5000 mL | INJECTION | INTRAMUSCULAR | Status: DC
  Filled 2012-09-30: qty 0.5

## 2012-09-30 MED ORDER — OXYCODONE HCL 5 MG PO TABS: 5.0000 mg | ORAL_TABLET | Freq: Once | ORAL | Status: DC | PRN

## 2012-09-30 MED ORDER — ROCURONIUM BROMIDE 100 MG/10ML IV SOLN
INTRAVENOUS | Status: DC | PRN
  Administered 2012-09-30: 40 mg via INTRAVENOUS
  Administered 2012-09-30: 50 mg via INTRAVENOUS

## 2012-09-30 MED ORDER — ARTIFICIAL TEARS OP OINT
TOPICAL_OINTMENT | OPHTHALMIC | Status: DC | PRN
  Administered 2012-09-30: 1 via OPHTHALMIC

## 2012-09-30 MED ORDER — ONDANSETRON HCL 4 MG/2ML IJ SOLN
INTRAMUSCULAR | Status: DC | PRN
  Administered 2012-09-30: 4 mg via INTRAVENOUS

## 2012-09-30 MED ORDER — LACTATED RINGERS IV SOLN
INTRAVENOUS | Status: DC | PRN
  Administered 2012-09-30 (×2): via INTRAVENOUS

## 2012-09-30 MED ORDER — SODIUM CHLORIDE 0.9 % IR SOLN
Status: DC | PRN
  Administered 2012-09-30: 14:00:00

## 2012-09-30 MED ORDER — FENTANYL CITRATE 0.05 MG/ML IJ SOLN
INTRAMUSCULAR | Status: DC | PRN
  Administered 2012-09-30: 100 ug via INTRAVENOUS
  Administered 2012-09-30 (×2): 50 ug via INTRAVENOUS
  Administered 2012-09-30 (×3): 100 ug via INTRAVENOUS

## 2012-09-30 MED ORDER — CLONIDINE HCL 0.1 MG PO TABS
0.1000 mg | ORAL_TABLET | Freq: Every day | ORAL | Status: DC
  Administered 2012-09-30 – 2012-10-03 (×3): 0.1 mg via ORAL
  Filled 2012-09-30 (×5): qty 1

## 2012-09-30 MED ORDER — LIDOCAINE-EPINEPHRINE 1 %-1:100000 IJ SOLN
INTRAMUSCULAR | Status: DC | PRN
  Administered 2012-09-30: 5 mL

## 2012-09-30 MED ORDER — 0.9 % SODIUM CHLORIDE (POUR BTL) OPTIME
TOPICAL | Status: DC | PRN
  Administered 2012-09-30 (×2): 1000 mL

## 2012-09-30 MED ORDER — DEXAMETHASONE 2 MG PO TABS
2.0000 mg | ORAL_TABLET | Freq: Two times a day (BID) | ORAL | Status: DC
  Administered 2012-09-30: 2 mg via ORAL
  Filled 2012-09-30 (×5): qty 1

## 2012-09-30 MED ORDER — MESALAMINE 1000 MG RE SUPP
1000.0000 mg | Freq: Every evening | RECTAL | Status: DC | PRN
  Filled 2012-09-30: qty 1

## 2012-09-30 MED ORDER — HYDROMORPHONE HCL PF 1 MG/ML IJ SOLN
0.2500 mg | INTRAMUSCULAR | Status: DC | PRN
  Administered 2012-09-30 (×2): 0.5 mg via INTRAVENOUS

## 2012-09-30 MED ORDER — METOPROLOL TARTRATE 50 MG PO TABS
50.0000 mg | ORAL_TABLET | ORAL | Status: DC
Start: 2012-09-30 — End: 2012-09-30

## 2012-09-30 MED ORDER — PANTOPRAZOLE SODIUM 40 MG PO TBEC: 40.0000 mg | DELAYED_RELEASE_TABLET | Freq: Every day | ORAL | Status: DC | PRN

## 2012-09-30 MED ORDER — HYDROCODONE-ACETAMINOPHEN 5-325 MG PO TABS
1.0000 | ORAL_TABLET | Freq: Four times a day (QID) | ORAL | Status: DC | PRN
  Administered 2012-09-30: 1 via ORAL
  Filled 2012-09-30 (×2): qty 1

## 2012-09-30 MED ORDER — METOPROLOL TARTRATE 50 MG PO TABS
50.0000 mg | ORAL_TABLET | Freq: Every evening | ORAL | Status: DC | PRN
  Filled 2012-09-30: qty 1

## 2012-09-30 MED ORDER — MORPHINE SULFATE 2 MG/ML IJ SOLN
1.0000 mg | INTRAMUSCULAR | Status: DC | PRN
  Administered 2012-10-01: 1 mg via INTRAVENOUS
  Filled 2012-09-30 (×2): qty 1

## 2012-09-30 MED ORDER — BACITRACIN 50000 UNITS IM SOLR
INTRAMUSCULAR | Status: AC
  Filled 2012-09-30: qty 1

## 2012-09-30 MED ORDER — ONDANSETRON HCL 4 MG PO TABS: 4.0000 mg | ORAL_TABLET | ORAL | Status: DC | PRN

## 2012-09-30 MED ORDER — SODIUM CHLORIDE 0.9 % IV SOLN
INTRAVENOUS | Status: AC
  Filled 2012-09-30: qty 500

## 2012-09-30 MED ORDER — PROPOFOL 10 MG/ML IV BOLUS
INTRAVENOUS | Status: DC | PRN
  Administered 2012-09-30: 120 mg via INTRAVENOUS
  Administered 2012-09-30: 80 mg via INTRAVENOUS

## 2012-09-30 MED ORDER — ONDANSETRON 4 MG PO TBDP
4.0000 mg | ORAL_TABLET | Freq: Every day | ORAL | Status: DC | PRN
  Filled 2012-09-30: qty 1

## 2012-09-30 MED ORDER — PANTOPRAZOLE SODIUM 40 MG IV SOLR
40.0000 mg | Freq: Every day | INTRAVENOUS | Status: DC
  Administered 2012-09-30: 40 mg via INTRAVENOUS
  Filled 2012-09-30 (×2): qty 40

## 2012-09-30 MED ORDER — MIDAZOLAM HCL 5 MG/5ML IJ SOLN
INTRAMUSCULAR | Status: DC | PRN
  Administered 2012-09-30: 2 mg via INTRAVENOUS

## 2012-09-30 MED ORDER — DEXAMETHASONE SODIUM PHOSPHATE 4 MG/ML IJ SOLN
2.0000 mg | Freq: Two times a day (BID) | INTRAMUSCULAR | Status: DC
  Administered 2012-09-30: 2 mg via INTRAVENOUS
  Filled 2012-09-30 (×2): qty 0.5
  Filled 2012-09-30: qty 1

## 2012-09-30 MED ORDER — HYDROCORTISONE ACE-PRAMOXINE 2.5-1 % RE CREA
1.0000 "application " | TOPICAL_CREAM | Freq: Every evening | RECTAL | Status: DC | PRN
  Filled 2012-09-30: qty 30

## 2012-09-30 MED ORDER — PHENOL 1.4 % MT LIQD
1.0000 | OROMUCOSAL | Status: DC | PRN
  Administered 2012-09-30: 1 via OROMUCOSAL
  Filled 2012-09-30: qty 177

## 2012-09-30 MED ORDER — ONDANSETRON HCL 4 MG/2ML IJ SOLN: 4.0000 mg | Freq: Once | INTRAMUSCULAR | Status: DC | PRN

## 2012-09-30 MED ORDER — PHENOBARBITAL 32.4 MG PO TABS
64.8000 mg | ORAL_TABLET | Freq: Every day | ORAL | Status: DC
  Administered 2012-09-30 – 2012-10-03 (×4): 64.8 mg via ORAL
  Filled 2012-09-30 (×4): qty 2

## 2012-09-30 MED ORDER — POTASSIUM CHLORIDE CRYS ER 20 MEQ PO TBCR
20.0000 meq | EXTENDED_RELEASE_TABLET | Freq: Every day | ORAL | Status: DC
  Filled 2012-09-30 (×5): qty 1

## 2012-09-30 MED ORDER — METOPROLOL TARTRATE 50 MG PO TABS
50.0000 mg | ORAL_TABLET | Freq: Every day | ORAL | Status: DC
  Filled 2012-09-30: qty 1

## 2012-09-30 MED ORDER — POLYETHYLENE GLYCOL 3350 17 G PO PACK
17.0000 g | PACK | Freq: Every day | ORAL | Status: DC
  Filled 2012-09-30 (×5): qty 1

## 2012-09-30 MED ORDER — CLONAZEPAM 0.5 MG PO TABS
0.5000 mg | ORAL_TABLET | Freq: Two times a day (BID) | ORAL | Status: DC
  Administered 2012-09-30 – 2012-10-04 (×8): 0.5 mg via ORAL
  Filled 2012-09-30 (×10): qty 1

## 2012-09-30 MED ORDER — HYDROCORTISONE ACE-PRAMOXINE 1-1 % RE FOAM
1.0000 | Freq: Every evening | RECTAL | Status: DC | PRN
  Filled 2012-09-30: qty 10

## 2012-09-30 MED ORDER — LIDOCAINE HCL (CARDIAC) 20 MG/ML IV SOLN
INTRAVENOUS | Status: DC | PRN
  Administered 2012-09-30: 20 mg via INTRAVENOUS

## 2012-09-30 MED ORDER — PHENYLEPHRINE HCL 10 MG/ML IJ SOLN
10.0000 mg | INTRAVENOUS | Status: DC | PRN
  Administered 2012-09-30: 10 ug/min via INTRAVENOUS

## 2012-09-30 MED ORDER — HYDROMORPHONE HCL PF 1 MG/ML IJ SOLN
INTRAMUSCULAR | Status: AC
  Filled 2012-09-30: qty 1

## 2012-09-30 MED ORDER — ONDANSETRON HCL 4 MG/2ML IJ SOLN
4.0000 mg | INTRAMUSCULAR | Status: DC | PRN
  Administered 2012-10-01: 4 mg via INTRAVENOUS
  Filled 2012-09-30: qty 2

## 2012-09-30 MED ORDER — HYDROCODONE-ACETAMINOPHEN 5-325 MG PO TABS
1.0000 | ORAL_TABLET | Freq: Four times a day (QID) | ORAL | Status: DC | PRN
  Administered 2012-09-30 – 2012-10-02 (×4): 1 via ORAL
  Administered 2012-10-02 (×2): 0.5 via ORAL
  Administered 2012-10-02 – 2012-10-03 (×3): 1 via ORAL
  Administered 2012-10-03: 0.5 via ORAL
  Administered 2012-10-03 – 2012-10-04 (×4): 1 via ORAL
  Filled 2012-09-30 (×14): qty 1

## 2012-09-30 MED ORDER — ACETAMINOPHEN 10 MG/ML IV SOLN
INTRAVENOUS | Status: AC
  Administered 2012-09-30: 1000 mg via INTRAVENOUS
  Filled 2012-09-30: qty 100

## 2012-09-30 MED ORDER — PROMETHAZINE HCL 25 MG PO TABS: 12.5000 mg | ORAL_TABLET | ORAL | Status: DC | PRN

## 2012-09-30 MED ORDER — THROMBIN 20000 UNITS EX SOLR
CUTANEOUS | Status: DC | PRN
  Administered 2012-09-30: 14:00:00 via TOPICAL

## 2012-09-30 MED ORDER — HYDROCORTISONE ACETATE 25 MG RE SUPP
25.0000 mg | Freq: Every evening | RECTAL | Status: DC | PRN
  Filled 2012-09-30: qty 1

## 2012-09-30 MED ORDER — DEXAMETHASONE SODIUM PHOSPHATE 10 MG/ML IJ SOLN
INTRAMUSCULAR | Status: AC
  Administered 2012-09-30: 10 mg via INTRAVENOUS
  Filled 2012-09-30: qty 1

## 2012-09-30 MED ORDER — OXYCODONE HCL 5 MG/5ML PO SOLN: 5.0000 mg | Freq: Once | ORAL | Status: DC | PRN

## 2012-09-30 MED ORDER — LABETALOL HCL 5 MG/ML IV SOLN
INTRAVENOUS | Status: DC | PRN
  Administered 2012-09-30 (×2): 10 mg via INTRAVENOUS

## 2012-09-30 MED ORDER — BUPIVACAINE HCL (PF) 0.5 % IJ SOLN
INTRAMUSCULAR | Status: DC | PRN
  Administered 2012-09-30: 5 mL

## 2012-09-30 MED ORDER — LACTATED RINGERS IV SOLN
INTRAVENOUS | Status: DC
  Administered 2012-09-30: 09:00:00 via INTRAVENOUS

## 2012-09-30 MED ORDER — PHENYLEPHRINE HCL 10 MG/ML IJ SOLN
INTRAMUSCULAR | Status: DC | PRN
  Administered 2012-09-30 (×2): 80 ug via INTRAVENOUS
  Administered 2012-09-30: 40 ug via INTRAVENOUS

## 2012-09-30 MED ORDER — SODIUM CHLORIDE 0.9 % IV SOLN
INTRAVENOUS | Status: DC
  Administered 2012-09-30: 19:00:00 via INTRAVENOUS

## 2012-09-30 SURGICAL SUPPLY — 83 items
BAG DECANTER FOR FLEXI CONT (MISCELLANEOUS) ×2 IMPLANT
BANDAGE GAUZE ELAST BULKY 4 IN (GAUZE/BANDAGES/DRESSINGS) ×2 IMPLANT
BIT DRILL WIRE PASS 1.3MM (BIT) IMPLANT
BLADE SURG ROTATE 9660 (MISCELLANEOUS) ×6 IMPLANT
BRUSH SCRUB EZ 1% IODOPHOR (MISCELLANEOUS) ×2 IMPLANT
BRUSH SCRUB EZ PLAIN DRY (MISCELLANEOUS) ×2 IMPLANT
BUR ACORN 6.0 PRECISION (BURR) ×2 IMPLANT
BUR ROUTER D-58 CRANI (BURR) IMPLANT
CANISTER SUCTION 2500CC (MISCELLANEOUS) ×2 IMPLANT
CLIP TI MEDIUM 6 (CLIP) IMPLANT
CLOTH BEACON ORANGE TIMEOUT ST (SAFETY) ×2 IMPLANT
CONT SPEC 4OZ CLIKSEAL STRL BL (MISCELLANEOUS) ×8 IMPLANT
CORDS BIPOLAR (ELECTRODE) ×2 IMPLANT
COTTONBALL LRG STERILE PKG (GAUZE/BANDAGES/DRESSINGS) IMPLANT
COVER TABLE BACK 60X90 (DRAPES) ×4 IMPLANT
DECANTER SPIKE VIAL GLASS SM (MISCELLANEOUS) ×2 IMPLANT
DRAIN CHANNEL 10M FLAT 3/4 FLT (DRAIN) IMPLANT
DRAIN SUBARACHNOID (WOUND CARE) IMPLANT
DRAPE LONG LASER MIC (DRAPES) IMPLANT
DRAPE MICROSCOPE LEICA (MISCELLANEOUS) ×2 IMPLANT
DRAPE SURG IRRIG POUCH 19X23 (DRAPES) ×2 IMPLANT
DRAPE WARM FLUID 44X44 (DRAPE) ×2 IMPLANT
DRESSING TELFA 8X3 (GAUZE/BANDAGES/DRESSINGS) ×2 IMPLANT
DRILL WIRE PASS 1.3MM (BIT)
DRSG ADAPTIC 3X8 NADH LF (GAUZE/BANDAGES/DRESSINGS) ×2 IMPLANT
DRSG OPSITE 4X5.5 SM (GAUZE/BANDAGES/DRESSINGS) ×4 IMPLANT
DURAFORM SPONGE 2X2 SINGLE (Neuro Prosthesis/Implant) ×2 IMPLANT
DURAPREP 6ML APPLICATOR 50/CS (WOUND CARE) ×2 IMPLANT
ELECT CAUTERY BLADE 6.4 (BLADE) ×2 IMPLANT
ELECT REM PT RETURN 9FT ADLT (ELECTROSURGICAL) ×2
ELECTRODE REM PT RTRN 9FT ADLT (ELECTROSURGICAL) ×1 IMPLANT
EVACUATOR 1/8 PVC DRAIN (DRAIN) IMPLANT
EVACUATOR SILICONE 100CC (DRAIN) IMPLANT
GAUZE SPONGE 4X4 16PLY XRAY LF (GAUZE/BANDAGES/DRESSINGS) IMPLANT
GLOVE BIO SURGEON STRL SZ 6.5 (GLOVE) ×2 IMPLANT
GLOVE BIO SURGEON STRL SZ7.5 (GLOVE) IMPLANT
GLOVE BIOGEL PI IND STRL 7.0 (GLOVE) ×1 IMPLANT
GLOVE BIOGEL PI IND STRL 7.5 (GLOVE) ×1 IMPLANT
GLOVE BIOGEL PI IND STRL 8.5 (GLOVE) ×1 IMPLANT
GLOVE BIOGEL PI INDICATOR 7.0 (GLOVE) ×1
GLOVE BIOGEL PI INDICATOR 7.5 (GLOVE) ×1
GLOVE BIOGEL PI INDICATOR 8.5 (GLOVE) ×1
GLOVE ECLIPSE 8.5 STRL (GLOVE) ×2 IMPLANT
GLOVE EXAM NITRILE LRG STRL (GLOVE) IMPLANT
GLOVE EXAM NITRILE MD LF STRL (GLOVE) IMPLANT
GLOVE EXAM NITRILE XL STR (GLOVE) IMPLANT
GLOVE EXAM NITRILE XS STR PU (GLOVE) IMPLANT
GLOVE SURG SS PI 7.0 STRL IVOR (GLOVE) ×6 IMPLANT
GOWN BRE IMP SLV AUR LG STRL (GOWN DISPOSABLE) IMPLANT
GOWN BRE IMP SLV AUR XL STRL (GOWN DISPOSABLE) ×2 IMPLANT
GOWN STRL REIN 2XL LVL4 (GOWN DISPOSABLE) ×2 IMPLANT
HEMOSTAT SURGICEL 2X14 (HEMOSTASIS) IMPLANT
HOOK DURA (MISCELLANEOUS) ×2 IMPLANT
KIT BASIN OR (CUSTOM PROCEDURE TRAY) ×2 IMPLANT
KIT ROOM TURNOVER OR (KITS) ×2 IMPLANT
NEEDLE HYPO 22GX1.5 SAFETY (NEEDLE) ×2 IMPLANT
NS IRRIG 1000ML POUR BTL (IV SOLUTION) ×2 IMPLANT
PACK CRANIOTOMY (CUSTOM PROCEDURE TRAY) ×2 IMPLANT
PAD ARMBOARD 7.5X6 YLW CONV (MISCELLANEOUS) ×6 IMPLANT
PATTIES SURGICAL .25X.25 (GAUZE/BANDAGES/DRESSINGS) IMPLANT
PATTIES SURGICAL .5 X.5 (GAUZE/BANDAGES/DRESSINGS) IMPLANT
PATTIES SURGICAL .5 X3 (DISPOSABLE) IMPLANT
PATTIES SURGICAL 1/4 X 3 (GAUZE/BANDAGES/DRESSINGS) IMPLANT
PATTIES SURGICAL 1X1 (DISPOSABLE) IMPLANT
PATTIES SURGICAL 3 X3 (GAUZE/BANDAGES/DRESSINGS)
PATTIES SURGICAL 3X3 (GAUZE/BANDAGES/DRESSINGS) IMPLANT
PIN MAYFIELD SKULL DISP (PIN) ×2 IMPLANT
SPONGE GAUZE 4X4 12PLY (GAUZE/BANDAGES/DRESSINGS) IMPLANT
SPONGE NEURO XRAY DETECT 1X3 (DISPOSABLE) IMPLANT
SPONGE SURGIFOAM ABS GEL 100 (HEMOSTASIS) ×2 IMPLANT
STAPLER SKIN PROX WIDE 3.9 (STAPLE) ×2 IMPLANT
SUT ETHILON 3 0 FSL (SUTURE) IMPLANT
SUT NURALON 4 0 TR CR/8 (SUTURE) ×4 IMPLANT
SUT VIC AB 2-0 CP2 18 (SUTURE) ×4 IMPLANT
SYR 20ML ECCENTRIC (SYRINGE) ×2 IMPLANT
SYR CONTROL 10ML LL (SYRINGE) ×4 IMPLANT
TIP SONASTAR STD MISONIX 1.9 (TRAY / TRAY PROCEDURE) IMPLANT
TOWEL OR 17X24 6PK STRL BLUE (TOWEL DISPOSABLE) ×2 IMPLANT
TOWEL OR 17X26 10 PK STRL BLUE (TOWEL DISPOSABLE) ×2 IMPLANT
TRAP SPECIMEN MUCOUS 40CC (MISCELLANEOUS) ×2 IMPLANT
TRAY FOLEY CATH 14FRSI W/METER (CATHETERS) ×2 IMPLANT
UNDERPAD 30X30 INCONTINENT (UNDERPADS AND DIAPERS) IMPLANT
WATER STERILE IRR 1000ML POUR (IV SOLUTION) ×2 IMPLANT

## 2012-09-30 NOTE — Op Note (Signed)
Date of surgery: 09/30/2012 Preoperative diagnosis: Left cerebellar mass. Postoperative diagnosis: Left cerebellar mass (metastatic tumor)  Procedure: Suboccipital craniectomy on the right gross total resection of metastatic brain tumor with operating microscope microdissection technique Surgeon: Barnett Abu Anesthesia: Gen. endotracheal Indications: Patient is a 57 year old individual who has had significant problems with balance on her feet. She was found to have a cystic lesion in the left cerebellar hemisphere. She was treated with steroids initially but advised to undergo surgical resection as a metastatic workup has not revealed the source of a primary. Patient does have an underlying history of having had a melanoma resected some years ago.  Procedure: The patient was brought to the operating room supine on a stretcher. After the smooth induction of general endotracheal anesthesia, she was placed in a 3 pin headrest, and turned prone onto the operating table. The back of the neck was prepped by trimming the hair to the mid region of the occiput and then washing the scalp with Hibiclens then alcohol then prepping with DuraPrep. The midline of the back of the neck and the suboccipital region was prepped and draped sterilely. The skin was infiltrated in the midline with 10 cc of lidocaine with epinephrine. A midline incision was created. Incision was carried down through the galea. The cervical dorsal fascia was then opened and the suboccipital attachments of all muscles were released. The suboccipital region was exposed particularly off to the right side. When adequate exposure down to the foramen magnum was obtained and self-retaining retractors were placed in the wound the craniectomy was performed by using a high-speed bur with an acorn type bit to thin the bone of the left suboccipital fossa. As the dissection was continued a craniectomy measuring 3/2 cm in diameter was created. The underlying dura  was noted to be markedly discolored and purplish in one particular spot pointing to the apex of the tumor. The dura was then opened in a cruciate fashion over this region. The leaves were folded back and on its own the tumor delivered itself. There was substantial necrotic material that was suctioned in a Lukens trap and sent for biopsy. Then a very gelatinous appearing tumorous mass was encountered. This was resected in a piecemeal fashion with sections being sent for frozen section diagnosis. Other solid pieces of the tumor were similarly sent as specimen. The Lukens trap filled rapidly as the tumor was rather hemorrhagic. Hemostasis was obtained by placing pledgets of Gelfoam soaked thrombin into the cavity that was rapidly being performed. Then in a piecemeal fashion was able to cauterize the pia around the cerebellum and resect residual remnants of tumor from within the cavity. When all the tumor was resected and a glial surface of the cerebellum was obtained uniformly around the periphery hemostasis was well achieved. This was done with the use of the operating microscope. In the end good decompression of the left cerebellar hemisphere was obtained. Cerebral spinal fluid was also released and this maneuver. With this the dura was closed by folding the leaflets back together. A piece of DuraGen was placed underneath the dural opening so as to [facility] not the dural closure which was nearly watertight but not completely so. DuraGen was also placed on an outside of the dura. Then the retractors were removed the muscle and fascia was closed loosely initially in the muscle layers and the fascia was closed tightly with 2-0 Vicryl in interrupted fashion. The galea was closed with 2-0 Vicryl. Surgical staples were placed in the scalp. Blood  loss for the procedure was estimated at 400 cc.  The frozen section diagnosis was released as highly malignant tumor.

## 2012-09-30 NOTE — Anesthesia Preprocedure Evaluation (Addendum)
Anesthesia Evaluation  Patient identified by MRN, date of birth, ID band Patient awake    Reviewed: Allergy & Precautions, H&P , NPO status , Patient's Chart, lab work & pertinent test results  Airway Mallampati: I TM Distance: >3 FB Neck ROM: Full    Dental  (+) Teeth Intact and Dental Advisory Given   Pulmonary  breath sounds clear to auscultation        Cardiovascular hypertension, Pt. on medications + pacemaker Rhythm:Regular Rate:Normal     Neuro/Psych    GI/Hepatic   Endo/Other  diabetes (Dieet controlled), Well Controlled  Renal/GU      Musculoskeletal   Abdominal   Peds  Hematology   Anesthesia Other Findings   Reproductive/Obstetrics                           Anesthesia Physical Anesthesia Plan  ASA: III  Anesthesia Plan: General   Post-op Pain Management:    Induction: Intravenous  Airway Management Planned: Oral ETT  Additional Equipment: Ultrasound Guidance Line Placement, Arterial line and CVP  Intra-op Plan:   Post-operative Plan: Extubation in OR  Informed Consent: I have reviewed the patients History and Physical, chart, labs and discussed the procedure including the risks, benefits and alternatives for the proposed anesthesia with the patient or authorized representative who has indicated his/her understanding and acceptance.   Dental advisory given  Plan Discussed with: CRNA, Anesthesiologist and Surgeon  Anesthesia Plan Comments:         Anesthesia Quick Evaluation

## 2012-09-30 NOTE — Plan of Care (Signed)
Problem: Consults Goal: Diagnosis - Craniotomy Tumor     

## 2012-09-30 NOTE — H&P (Signed)
[name] is an 57 y.o. female.   Chief Complaint: Suboccipital brain tumor HPI: Patient is a 57 year old right-handed white female who had developed some difficulties with staggering gait and unsteadiness. A CT scan of the brain was performed. This demonstrated the presence of a suboccipital lesion in her brain. Lesion was in the left hemisphere just off the midline. She cannot have an MRI scan. A CT scan with contrast demonstrates that this is a cystic lesion. There is a secondary small parietal lesion also noted. She is now admitted to undergo surgical resection. Metastatic workup revealed no other lesions either in the lungs chest abdomen or pelvis.  Past Medical History  Diagnosis Date  . 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 Hypoglycemia, unspecified   . Other nonspecific abnormal serum enzyme levels   . Cervicalgia   . Disturbance of skin sensation   . Other specified congenital anomalies of nervous system   . Swelling of limb   . Disorder of bone and cartilage, unspecified   . Atrial tachycardia   . CVA (cerebral infarction)     2012 with dizziness and vision change felt embolic from atrial tachy  . History of pacemaker   . POTS (postural orthostatic tachycardia syndrome)   . Diverticulosis   . Skin cancer   . Osteopenia   . DVT (deep venous thrombosis)   . Atrial fibrillation   . DM (diabetes mellitus)   . DVT (deep venous thrombosis)   . HLD (hyperlipidemia)   . HTN (hypertension)   . Pacemaker     autonomic dysfunction  . Complication of anesthesia     pt states wakes up with "shakes"  . PONV (postoperative nausea and vomiting)   . Stroke     left weaker     Past Surgical History  Procedure Laterality Date  . Insert / replace / remove pacemaker  2012    1999, x 3  . Ablation saphenous vein w/ rfa      2002  . Tonsillectomy and adenoidectomy    . Carpal tunnel release      left  . Nose surgery  2010, 2012    for nose bleeds x  2  . Fatty tumor removed      from chest  . Melanoma excision      with removal of lymph nodes, left shoulder  . Deep axillary sentinel node biopsy / excision      due to extensive Melanoma-right arm  . Laparoscopy  09/06/2011    Procedure: LAPAROSCOPY OPERATIVE;  Surgeon: Tresa Endo A. Ernestina Penna, MD;  Location: [facility];  Service: Gynecology;  Laterality: Left;  with Left Ovarian Cystectomy     Family History  Problem Relation Age of Onset  . Heart disease Father   . Diabetes Maternal Grandmother   . Diabetes Paternal Grandmother   . Stroke Mother   . Colon polyps Mother   . Clotting disorder Maternal Grandmother     stroke  . Crohn's disease Maternal Grandmother   . Diabetes Father   . Kidney disease Father    Social History:  reports that she has never smoked. She has never used smokeless tobacco. She reports that  drinks alcohol. She reports that she does not use illicit drugs.  Allergies:  Allergies  Allergen Reactions  . Doxycycline Hives  . Erythromycin Hives  . Iohexol      Desc: pt. states allergic to dye,-did fine with pre-meds. 02/13/05 06/14/10...pt did not  state allergy to contrast, no premeds given, pt tolerated contrast.   . Penicillins Hives  . Preparation H (Pramox-Pe-Glycerin-Petrolatum) Hives    Cannot use  Cream or Suppositories   . Sulfonamide Derivatives Hives    Medications Prior to Admission  Medication Sig Dispense Refill  . acetaminophen (TYLENOL) 500 MG tablet Take 1,000 mg by mouth every 6 (six) hours as needed for pain.      . Cholecalciferol 50000 UNITS TABS Take 50,000 Units by mouth every 7 (seven) days. On Saturdays (from [facility])      . clonazePAM (KLONOPIN) 0.5 MG tablet Take 0.5 mg by mouth 2 (two) times daily.      . cloNIDine (CATAPRES) 0.1 MG tablet Take 0.1 mg by mouth at bedtime.       . cyanocobalamin (,VITAMIN B-12,) 1000 MCG/ML injection Inject 1,000 mcg into the muscle every 30 (thirty) days. Last injection 09/21/12       . dexamethasone (DECADRON) 2 MG tablet Take 2 mg by mouth 2 (two) times daily with a meal.      . HYDROcodone-acetaminophen (NORCO/VICODIN) 5-325 MG per tablet Take 1 tablet by mouth every 6 (six) hours as needed for pain.      . metoprolol (LOPRESSOR) 50 MG tablet Take 50 mg by mouth See admin instructions. Take 1 tablet (50 mg) every morning and a 2nd tablet in the  evening as needed for diastolic blood pressure >100      . pantoprazole (PROTONIX) 40 MG tablet Take 40 mg by mouth daily as needed (acid reflux).       Marland Kitchen PHENobarbital (LUMINAL) 64.8 MG tablet Take 1 tablet (64.8 mg total) by mouth at bedtime.  90 tablet  3  . polyethylene glycol (MIRALAX / GLYCOLAX) packet Take 17 g by mouth at bedtime. Mix with 8 oz of liquid and drink      . potassium chloride SA (K-DUR,KLOR-CON) 20 MEQ tablet Take 20 mEq by mouth daily.       . rosuvastatin (CRESTOR) 5 MG tablet Take 1 tablet (5 mg total) by mouth daily.  90 tablet  3  . hydrocortisone (ANUSOL-HC) 25 MG suppository Place 25 mg rectally at bedtime as needed for hemorrhoids.      . hydrocortisone-pramoxine (ANALPRAM-HC) 2.5-1 % rectal cream Place 1 application rectally at bedtime as needed for hemorrhoids.      . mesalamine (CANASA) 1000 MG suppository Place 1,000 mg rectally at bedtime as needed (hemorrhoids).       . ondansetron (ZOFRAN-ODT) 4 MG disintegrating tablet Take 4 mg by mouth daily as needed for nausea.        Results for orders placed during the hospital encounter of 09/30/12 (from the past 48 hour(s))  GLUCOSE, CAPILLARY     Status: None   Collection Time    09/30/12  8:43 AM      Result Value Range   Glucose-Capillary 85  70 - 99 mg/dL   No results found.  Review of Systems  Constitutional: Positive for malaise/fatigue.  Eyes: Negative.   Respiratory: Negative.   Cardiovascular: Negative.   Gastrointestinal: Negative.   Genitourinary: Negative.   Musculoskeletal: Negative.   Neurological: Positive for headaches.   Endo/Heme/Allergies: Negative.   Psychiatric/Behavioral: Negative.     Blood pressure 131/78, pulse 70, temperature 97.3 F (36.3 C), temperature source Oral, resp. rate 20, SpO2 100.00%. Physical Exam  Constitutional: She is oriented to person, place, and time. She appears well-developed and well-nourished.  HENT:  Head: Normocephalic and  atraumatic.  Eyes: Conjunctivae and EOM are normal. Pupils are equal, round, and reactive to light.  Neck: Normal range of motion. Neck supple.  Cardiovascular: Normal rate and regular rhythm.   Respiratory: Effort normal and breath sounds normal.  GI: Soft. Bowel sounds are normal.  Musculoskeletal: Normal range of motion.  Neurological: She is alert and oriented to person, place, and time. She has normal reflexes.  Skin: Skin is warm and dry.  Psychiatric: She has a normal mood and affect. Her behavior is normal. Judgment and thought content normal.     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  Patient is now admitted to undergo surgical resection via a suboccipital craniectomy.  Kamarah Bilotta J 09/30/2012, 12:20 PM

## 2012-09-30 NOTE — Anesthesia Procedure Notes (Signed)
Procedure Name: Intubation Date/Time: 09/30/2012 1:00 PM Performed by: Orlinda Blalock, Ellieana Dolecki L Pre-anesthesia Checklist: Patient identified, Suction available, Patient being monitored and Timeout performed Patient Re-evaluated:Patient Re-evaluated prior to inductionOxygen Delivery Method: Circle system utilized Preoxygenation: Pre-oxygenation with 100% oxygen Intubation Type: IV induction Ventilation: Oral airway inserted - appropriate to patient size and Mask ventilation without difficulty Laryngoscope Size: Miller and 2 Grade View: Grade III Tube type: Oral Tube size: 7.5 mm Number of attempts: 1 Airway Equipment and Method: Stylet Placement Confirmation: ETT inserted through vocal cords under direct vision,  positive ETCO2 and breath sounds checked- equal and bilateral Secured at: 20 cm Tube secured with: Tape Dental Injury: Teeth and Oropharynx as per pre-operative assessment

## 2012-09-30 NOTE — Consult Note (Signed)
PULMONARY  / CRITICAL CARE MEDICINE  Name: [name] MRN: [id number] DOB: 06-Dec-1955    ADMISSION DATE:  09/30/2012 CONSULTATION DATE:  09/30/12  REFERRING MD :  Dr. Danielle Dess PRIMARY SERVICE: Neurosurg  CHIEF COMPLAINT:  suboccipital brain tumor  BRIEF PATIENT DESCRIPTION: 57 yo F with hx of stroke, HTN, HLD, Afib, and DM presents to [facility] on 8/5 with a suboccipital brain tumor. Patient underwent surgical resection via suboccipital craniectomy on 8/5. PCCM was consulted for vent maintenance.   SIGNIFICANT EVENTS / STUDIES:  8/5- admissions 8/5- suboccipital craniectomy   LINES / TUBES: 8/5 ALine>> 8/5 RIJ>>>  CULTURES: 8/4 surgical PCR>>>neg MRSA, pos S. Aureus   ANTIBIOTICS: 8/5 vanco x 1 dose pre op >>> 8/5  HISTORY OF PRESENT ILLNESS:  57 yo F with hx of stroke, HTN, HLD, Afib, and DM presents to [facility] on 8/5 with a suboccipital brain tumor. Patient underwent surgical resection via suboccipital craniectomy on 8/5. PCCM was consulted for vent maintenance.    PAST MEDICAL HISTORY :  Past Medical History  Diagnosis Date  . 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 Hypoglycemia, unspecified   . Other nonspecific abnormal serum enzyme levels   . Cervicalgia   . Disturbance of skin sensation   . Other specified congenital anomalies of nervous system   . Swelling of limb   . Disorder of bone and cartilage, unspecified   . Atrial tachycardia   . CVA (cerebral infarction)     2012 with dizziness and vision change felt embolic from atrial tachy  . History of pacemaker   . POTS (postural orthostatic tachycardia syndrome)   . Diverticulosis   . Skin cancer   . Osteopenia   . DVT (deep venous thrombosis)   . Atrial fibrillation   . DM (diabetes mellitus)   . DVT (deep venous thrombosis)   . HLD (hyperlipidemia)   . HTN (hypertension)   . Pacemaker     autonomic dysfunction  . Complication of anesthesia     pt states wakes up with "shakes"  . PONV  (postoperative nausea and vomiting)   . Stroke     left weaker    Past Surgical History  Procedure Laterality Date  . Insert / replace / remove pacemaker  2012    1999, x 3  . Ablation saphenous vein w/ rfa      2002  . Tonsillectomy and adenoidectomy    . Carpal tunnel release      left  . Nose surgery  2010, 2012    for nose bleeds x 2  . Fatty tumor removed      from chest  . Melanoma excision      with removal of lymph nodes, left shoulder  . Deep axillary sentinel node biopsy / excision      due to extensive Melanoma-right arm  . Laparoscopy  09/06/2011    Procedure: LAPAROSCOPY OPERATIVE;  Surgeon: Tresa Endo A. Ernestina Penna, MD;  Location: [facility];  Service: Gynecology;  Laterality: Left;  with Left Ovarian Cystectomy    Prior to Admission medications   Medication Sig Start Date End Date Taking? Authorizing Provider  acetaminophen (TYLENOL) 500 MG tablet Take 1,000 mg by mouth every 6 (six) hours as needed for pain.   Yes Historical Provider, MD  Cholecalciferol 50000 UNITS TABS Take 50,000 Units by mouth every 7 (seven) days. On Saturdays (from [facility])   Yes Historical Provider, MD  clonazePAM (KLONOPIN) 0.5 MG  tablet Take 0.5 mg by mouth 2 (two) times daily.   Yes Historical Provider, MD  cloNIDine (CATAPRES) 0.1 MG tablet Take 0.1 mg by mouth at bedtime.  03/03/12  Yes Cassell Clement, MD  cyanocobalamin (,VITAMIN B-12,) 1000 MCG/ML injection Inject 1,000 mcg into the muscle every 30 (thirty) days. Last injection 09/21/12   Yes Historical Provider, MD  dexamethasone (DECADRON) 2 MG tablet Take 2 mg by mouth 2 (two) times daily with a meal.   Yes Historical Provider, MD  HYDROcodone-acetaminophen (NORCO/VICODIN) 5-325 MG per tablet Take 1 tablet by mouth every 6 (six) hours as needed for pain.   Yes Historical Provider, MD  metoprolol (LOPRESSOR) 50 MG tablet Take 50 mg by mouth See admin instructions. Take 1 tablet (50 mg) every morning and a 2nd tablet in the  evening as  needed for diastolic blood pressure >100   Yes Historical Provider, MD  pantoprazole (PROTONIX) 40 MG tablet Take 40 mg by mouth daily as needed (acid reflux).    Yes Historical Provider, MD  PHENobarbital (LUMINAL) 64.8 MG tablet Take 1 tablet (64.8 mg total) by mouth at bedtime. 09/22/12  Yes Pricilla Riffle, MD  polyethylene glycol [facility] / GLYCOLAX) packet Take 17 g by mouth at bedtime. Mix with 8 oz of liquid and drink   Yes Historical Provider, MD  potassium chloride SA (K-DUR,KLOR-CON) 20 MEQ tablet Take 20 mEq by mouth daily.    Yes Historical Provider, MD  rosuvastatin (CRESTOR) 5 MG tablet Take 1 tablet (5 mg total) by mouth daily. 07/31/12  Yes Pricilla Riffle, MD  hydrocortisone (ANUSOL-HC) 25 MG suppository Place 25 mg rectally at bedtime as needed for hemorrhoids.    Historical Provider, MD  hydrocortisone-pramoxine [facility]) 2.5-1 % rectal cream Place 1 application rectally at bedtime as needed for hemorrhoids.    Historical Provider, MD  mesalamine (CANASA) 1000 MG suppository Place 1,000 mg rectally at bedtime as needed (hemorrhoids).     Historical Provider, MD  ondansetron (ZOFRAN-ODT) 4 MG disintegrating tablet Take 4 mg by mouth daily as needed for nausea.    Historical Provider, MD   Allergies  Allergen Reactions  . Doxycycline Hives  . Erythromycin Hives  . Iohexol      Desc: pt. states allergic to dye,-did fine with pre-meds. 02/13/05 06/14/10...pt did not state allergy to contrast, no premeds given, pt tolerated contrast.   . Penicillins Hives  . Preparation H (Pramox-Pe-Glycerin-Petrolatum) Hives    Cannot use  Cream or Suppositories   . Sulfonamide Derivatives Hives    FAMILY HISTORY:  Family History  Problem Relation Age of Onset  . Heart disease Father   . Diabetes Maternal Grandmother   . Diabetes Paternal Grandmother   . Stroke Mother   . Colon polyps Mother   . Clotting disorder Maternal Grandmother     stroke  . Crohn's disease Maternal Grandmother    . Diabetes Father   . Kidney disease Father    SOCIAL HISTORY:  reports that she has never smoked. She has never used smokeless tobacco. She reports that  drinks alcohol. She reports that she does not use illicit drugs.  REVIEW OF SYSTEMS:  All other systems normal expect those mentioned in HPI.   SUBJECTIVE: Patient alert and oriented, complains of headache and neck pain.  VITAL SIGNS: Temp:  [97 F (36.1 C)-97.5 F (36.4 C)] 97 F (36.1 C) (08/05 1601) Pulse Rate:  [63-76] 76 (08/05 1629) Resp:  [16-20] 18 (08/05 1629) BP: (109-131)/(65-78) 122/65 mmHg (  08/05 1629) SpO2:  [99 %-100 %] 100 % (08/05 1629) Weight:  [159 lb 2.8 oz (72.2 kg)] 159 lb 2.8 oz (72.2 kg) (08/05 1629) HEMODYNAMICS:   VENTILATOR SETTINGS:   INTAKE / OUTPUT: Intake/Output     08/04 0701 - 08/05 0700 08/05 0701 - 08/06 0700   I.V. (mL/kg)  2387.5 (33.1)   Total Intake(mL/kg)  2387.5 (33.1)   Urine (mL/kg/hr)  650   Blood  400   Total Output   1050   Net   +1337.5          PHYSICAL EXAMINATION: General: WDWN female in NAD  Neuro:  Alert and oriented x 4, no focal deficits  HEENT:  No JVD, trachea midline, PEERL  Cardiovascular:  RRR, no MRG Lungs: CTAB  Abdomen:  Soft, non-tender, non distended, +BS Musculoskeletal:  No deformities, no edema Skin:  Intact  LABS:  CBC Recent Labs     09/29/12  1400  WBC  8.8  HGB  14.4  HCT  42.4  PLT  277   Coag's No results found for this basename: APTT, INR,  in the last 72 hours BMET Recent Labs     09/29/12  1400  NA  137  K  3.9  CL  100  CO2  26  BUN  19  CREATININE  0.76  GLUCOSE  116*   Electrolytes Recent Labs     09/29/12  1400  CALCIUM  9.6   Sepsis Markers No results found for this basename: LACTICACIDVEN, PROCALCITON, O2SATVEN,  in the last 72 hours ABG No results found for this basename: PHART, PCO2ART, PO2ART,  in the last 72 hours Liver Enzymes No results found for this basename: AST, ALT, ALKPHOS, BILITOT,  ALBUMIN,  in the last 72 hours Cardiac Enzymes No results found for this basename: TROPONINI, PROBNP,  in the last 72 hours Glucose Recent Labs     09/30/12  0843  09/30/12  1548  GLUCAP  85  126*    Imaging Dg Chest Port 1 View  09/30/2012   *RADIOLOGY REPORT*  Clinical Data: Central line placement.  PORTABLE CHEST - 1 VIEW  Comparison: Two-view chest 09/07/2012.  Findings: A new right IJ line terminates just above the cavoatrial junction.  There is no pneumothorax.  Low lung volumes exaggerate the heart size.  There is no edema or effusion to suggest failure. Pacing wires are stable.  Linear airspace disease is noted in the medial left lower lobe.  IMPRESSION:  1.  Satisfactory positioning of a new right IJ line without evidence for pneumothorax. 2.  Cardiac enlargement is exaggerated by low lung volumes without evidence for failure. 3.  Medial left lower lobe airspace disease likely reflects atelectasis.  Early infection is considered less likely.   Original Report Authenticated By: Marin Roberts, M.D.     CXR: 8/5 - good positioning of RIJ, medial LLL airspace dz, most likely atx  ASSESSMENT / PLAN:  PULMONARY A: No acute issues P:   - Pulmonary hygiene. - Titrate O2 as needed for SpO2 92-95%. - Ambulate early in OK per neuro. - IS and flutter valve.   CARDIOVASCULAR A:  Hx of Afib Hx of HLD Hx of HTN P:  - Continue atorvastatin. - Continue Lopressor, clonidine, labetolol. - Tele monitor.  RENAL A:   P:   - Monitor UOP. - BMET in AM. - Replace electrolytes PRN.  GASTROINTESTINAL A:   No acute issues P:   - PPI for SUP. - Advance  diet as tolerated per Neuro.  HEMATOLOGIC A:  No acute issues P:  - SCD for DVT prophylaxis. - Monitor for bleeding. - CBC in AM.  INFECTIOUS A:   No acute infectious processes P:   - Follow WBC's. - Monitor fever curves.  ENDOCRINE A:  DM  P:   - SSI.  NEUROLOGIC A:   S/p suboccipital craniectomy P:   -  F/U by neurosurg.  TODAY'S SUMMARY: Monitor vitals, follow fever curve. Continue home meds for htn and hyperlipidemia.  I have personally obtained a history, examined the patient, evaluated laboratory and imaging results, formulated the assessment and plan and placed orders.  Alyson Reedy, M.D. Pulmonary and Critical Care Medicine [facility] Pager: [phone]  09/30/2012, 5:13 PM

## 2012-09-30 NOTE — Anesthesia Postprocedure Evaluation (Signed)
  Anesthesia Post-op Note  Patient: [name]  Procedure(s) Performed: Procedure(s) with comments: Suboccipital Craniectomy for resection of tumor (N/A) - Suboccipital Craniectomy for resection of tumor  Patient Location: PACU  Anesthesia Type:General  Level of Consciousness: awake, alert  and oriented  Airway and Oxygen Therapy: Patient Spontanous Breathing and Patient connected to nasal cannula oxygen  Post-op Pain: mild  Post-op Assessment: Post-op Vital signs reviewed  Post-op Vital Signs: Reviewed  Complications: No apparent anesthesia complications

## 2012-10-01 DIAGNOSIS — I6350 Cerebral infarction due to unspecified occlusion or stenosis of unspecified cerebral artery: Secondary | ICD-10-CM

## 2012-10-01 DIAGNOSIS — Z7901 Long term (current) use of anticoagulants: Secondary | ICD-10-CM

## 2012-10-01 DIAGNOSIS — I10 Essential (primary) hypertension: Secondary | ICD-10-CM

## 2012-10-01 DIAGNOSIS — I4891 Unspecified atrial fibrillation: Secondary | ICD-10-CM

## 2012-10-01 DIAGNOSIS — G909 Disorder of the autonomic nervous system, unspecified: Secondary | ICD-10-CM

## 2012-10-01 LAB — CBC
HCT: 36.8 % (ref 36.0–46.0)
Hemoglobin: 12.3 g/dL (ref 12.0–15.0)
MCH: 29.9 pg (ref 26.0–34.0)
MCHC: 33.4 g/dL (ref 30.0–36.0)
MCV: 89.5 fL (ref 78.0–100.0)
Platelets: 214 10*3/uL (ref 150–400)
RBC: 4.11 MIL/uL (ref 3.87–5.11)
RDW: 13.8 % (ref 11.5–15.5)
WBC: 13.1 10*3/uL — ABNORMAL HIGH (ref 4.0–10.5)

## 2012-10-01 LAB — BASIC METABOLIC PANEL
BUN: 10 mg/dL (ref 6–23)
CO2: 24 mEq/L (ref 19–32)
Calcium: 9.2 mg/dL (ref 8.4–10.5)
Chloride: 106 mEq/L (ref 96–112)
Creatinine, Ser: 0.47 mg/dL — ABNORMAL LOW (ref 0.50–1.10)
GFR calc Af Amer: >90 mL/min (ref >90)
GFR calc non Af Amer: >90 mL/min (ref >90)
Glucose, Bld: 125 mg/dL — ABNORMAL HIGH (ref 70–99)
Potassium: 4 mEq/L (ref 3.5–5.1)
Sodium: 139 mEq/L (ref 135–145)

## 2012-10-01 LAB — GLUCOSE, CAPILLARY
Glucose-Capillary: 103 mg/dL — ABNORMAL HIGH (ref 70–99)
Glucose-Capillary: 122 mg/dL — ABNORMAL HIGH (ref 70–99)
Glucose-Capillary: 151 mg/dL — ABNORMAL HIGH (ref 70–99)

## 2012-10-01 MED ORDER — SODIUM CHLORIDE 0.9 % IV SOLN
INTRAVENOUS | Status: DC
  Administered 2012-10-01 (×2): via INTRAVENOUS

## 2012-10-01 MED ORDER — KCL IN DEXTROSE-NACL 20-5-0.45 MEQ/L-%-% IV SOLN
INTRAVENOUS | Status: DC
  Filled 2012-10-01 (×2): qty 1000

## 2012-10-01 MED ORDER — SODIUM CHLORIDE 0.9 % IJ SOLN: 9.0000 mL | INTRAMUSCULAR | Status: DC | PRN

## 2012-10-01 MED ORDER — NALOXONE HCL 0.4 MG/ML IJ SOLN: 0.4000 mg | INTRAMUSCULAR | Status: DC | PRN

## 2012-10-01 MED ORDER — ONDANSETRON HCL 4 MG/2ML IJ SOLN: 4.0000 mg | Freq: Four times a day (QID) | INTRAMUSCULAR | Status: DC | PRN

## 2012-10-01 MED ORDER — DEXAMETHASONE 2 MG PO TABS
2.0000 mg | ORAL_TABLET | Freq: Four times a day (QID) | ORAL | Status: DC
  Administered 2012-10-01 – 2012-10-03 (×9): 2 mg via ORAL
  Filled 2012-10-01 (×12): qty 1

## 2012-10-01 MED ORDER — DIPHENHYDRAMINE HCL 50 MG/ML IJ SOLN: 12.5000 mg | Freq: Four times a day (QID) | INTRAMUSCULAR | Status: DC | PRN

## 2012-10-01 MED ORDER — DIPHENHYDRAMINE HCL 12.5 MG/5ML PO ELIX
12.5000 mg | ORAL_SOLUTION | Freq: Four times a day (QID) | ORAL | Status: DC | PRN
  Filled 2012-10-01: qty 5

## 2012-10-01 MED ORDER — INSULIN ASPART 100 UNIT/ML ~~LOC~~ SOLN: 0.0000 [IU] | Freq: Three times a day (TID) | SUBCUTANEOUS | Status: DC

## 2012-10-01 MED ORDER — MORPHINE SULFATE (PF) 1 MG/ML IV SOLN
INTRAVENOUS | Status: DC
  Administered 2012-10-01: 1.5 mg via INTRAVENOUS
  Administered 2012-10-01: 12:00:00 via INTRAVENOUS
  Administered 2012-10-01: 6 mg via INTRAVENOUS
  Administered 2012-10-02 (×4): 1.5 mg via INTRAVENOUS
  Filled 2012-10-01: qty 25

## 2012-10-01 NOTE — Progress Notes (Signed)
Chaplain Note:  Chaplain visited with pt and pt's family.  Pt was in bed, awake, alert, and fully oriented.  Pt stated she was experiencing headaches from the surgery she experienced yesterday.  Pt's family was at bedside in support of the pt.  Chaplain provided spiritual comfort, support, and prayer for pt and pt's family.  Chaplain will follow up as needed.  10/01/12 1030  Clinical Encounter Type  Visited With Patient and family together  Visit Type Spiritual support  Referral From Nurse  Spiritual Encounters  Spiritual Needs Prayer;Emotional  Stress Factors  Patient Stress Factors Health changes;Lack of knowledge;Major life changes  Family Stress Factors Lack of knowledge;Major life changes  Verdie Shire, [address] [phone]

## 2012-10-01 NOTE — Progress Notes (Signed)
PULMONARY  / CRITICAL CARE MEDICINE  Name: [name] MRN: [id number] DOB: August 01, 1955    ADMISSION DATE:  09/30/2012 CONSULTATION DATE:  09/30/12  REFERRING MD :  Dr. Danielle Dess PRIMARY SERVICE: Neurosurg  CHIEF COMPLAINT:  suboccipital brain tumor  BRIEF PATIENT DESCRIPTION: 57 yo F with hx of stroke, HTN, HLD, Afib, and DM presents to [facility] on 8/5 with a suboccipital brain tumor. Patient underwent surgical resection via suboccipital craniectomy on 8/5. PCCM was consulted for vent maintenance.   SIGNIFICANT EVENTS / STUDIES:  8/5- admissions 8/5- suboccipital craniectomy   LINES / TUBES: 8/5 ALine >> 8/06 8/5 R IJ CVL >>   CULTURES: MRSA PCR 8/04 >> NEG  ANTIBIOTICS:   SUBJECTIVE: No distress. C/O pain  VITAL SIGNS: Temp:  [97 F (36.1 C)-98.2 F (36.8 C)] 97.6 F (36.4 C) (08/06 1134) Pulse Rate:  [56-79] 70 (08/06 1200) Resp:  [13-21] 15 (08/06 1300) BP: (92-132)/(22-90) 105/59 mmHg (08/06 1300) SpO2:  [94 %-100 %] 100 % (08/06 1300) Arterial Line BP: (101-163)/(61-86) 138/67 mmHg (08/06 1200) Weight:  [72.2 kg (159 lb 2.8 oz)] 72.2 kg (159 lb 2.8 oz) (08/05 1629) HEMODYNAMICS:   VENTILATOR SETTINGS:   INTAKE / OUTPUT: Intake/Output     08/05 0701 - 08/06 0700 08/06 0701 - 08/07 0700   P.O. 240    I.V. (mL/kg) 3101.7 (43) 300 (4.2)   Total Intake(mL/kg) 3341.7 (46.3) 300 (4.2)   Urine (mL/kg/hr) 3535 260 (0.5)   Blood 400    Total Output 3935 260   Net -593.3 +40          PHYSICAL EXAMINATION: General: WDWN female in NAD  Neuro:  Alert and oriented x 4, no focal deficits  HEENT:  No JVD, trachea midline, PEERL  Cardiovascular:  RRR, no MRG Lungs: CTAB  Abdomen:  Soft, non-tender, non distended, +BS Musculoskeletal:  No deformities, no edema Skin:  Intact  LABS:  CBC Recent Labs     09/29/12  1400  10/01/12  0500  WBC  8.8  13.1*  HGB  14.4  12.3  HCT  42.4  36.8  PLT  277  214   Coag's No results found for this basename: APTT, INR,  in  the last 72 hours BMET Recent Labs     09/29/12  1400  10/01/12  0500  NA  137  139  K  3.9  4.0  CL  100  106  CO2  26  24  BUN  19  10  CREATININE  0.76  0.47*  GLUCOSE  116*  125*   Electrolytes Recent Labs     09/29/12  1400  10/01/12  0500  CALCIUM  9.6  9.2   Sepsis Markers No results found for this basename: LACTICACIDVEN, PROCALCITON, O2SATVEN,  in the last 72 hours ABG No results found for this basename: PHART, PCO2ART, PO2ART,  in the last 72 hours Liver Enzymes No results found for this basename: AST, ALT, ALKPHOS, BILITOT, ALBUMIN,  in the last 72 hours Cardiac Enzymes No results found for this basename: TROPONINI, PROBNP,  in the last 72 hours Glucose Recent Labs     09/30/12  0843  09/30/12  1548  10/01/12  1310  GLUCAP  85  126*  103*        CXR: NNF  ASSESSMENT / PLAN:  PULMONARY A: No acute issues P:      CARDIOVASCULAR A:  Hx of Afib Hx of HLD Hx of HTN P:  - Continue  atorvastatin. - Continue Lopressor, clonidine, labetolol. - Tele monitor.  RENAL A:   P:   - Monitor UOP. - BMET in AM. - Replace electrolytes PRN.  GASTROINTESTINAL A:   No acute issues P:   - PPI for SUP. - Advance diet as tolerated per Neuro.  HEMATOLOGIC A:  No acute issues P:  - SCD for DVT prophylaxis. - Monitor for bleeding. - CBC in AM.  INFECTIOUS A:   No acute infectious processes P:   - Follow WBC's. - Monitor fever curves.  ENDOCRINE A:  DM  P:   - SSI.  NEUROLOGIC A:   S/p suboccipital craniectomy P:   - F/U by neurosurg.   Billy Fischer, MD ; [facility] [phone].  After 5:30 PM or weekends, call [phone]

## 2012-10-01 NOTE — Progress Notes (Signed)
Subjective: Patient reports Alert oriented no diplopia. Minimal cerebellar signs this time. Patient complains of significant headache. Found call they'll not working last night setting.  Objective: Vital signs in last 24 hours: Temp:  [97 F (36.1 C)-98.2 F (36.8 C)] 98.2 F (36.8 C) (08/06 0800) Pulse Rate:  [56-79] 62 (08/06 0900) Resp:  [13-21] 18 (08/06 0900) BP: (92-132)/(22-90) 102/68 mmHg (08/06 0914) SpO2:  [94 %-100 %] 100 % (08/06 0900) Arterial Line BP: (101-163)/(61-86) 144/73 mmHg (08/06 0900) Weight:  [72.2 kg (159 lb 2.8 oz)] 72.2 kg (159 lb 2.8 oz) (08/05 1629)  Intake/Output from previous day: 08/05 0701 - 08/06 0700 In: 3341.7 [P.O.:240; I.V.:3101.7] Out: 3935 [Urine:3535; Blood:400] Intake/Output this shift: Total I/O In: 100 [I.V.:100] Out: 55 [Urine:55]  Incision is clean and dry dressing is intact. Motor function is good in upper and lower extremities. Patient has not been out of bed yet  Lab Results:  Recent Labs  09/29/12 1400 10/01/12 0500  WBC 8.8 13.1*  HGB 14.4 12.3  HCT 42.4 36.8  PLT 277 214   BMET  Recent Labs  09/29/12 1400 10/01/12 0500  NA 137 139  K 3.9 4.0  CL 100 106  CO2 26 24  GLUCOSE 116* 125*  BUN 19 10  CREATININE 0.76 0.47*  CALCIUM 9.6 9.2    Studies/Results: Dg Chest Port 1 View  09/30/2012   *RADIOLOGY REPORT*  Clinical Data: Central line placement.  PORTABLE CHEST - 1 VIEW  Comparison: Two-view chest 09/07/2012.  Findings: A new right IJ line terminates just above the cavoatrial junction.  There is no pneumothorax.  Low lung volumes exaggerate the heart size.  There is no edema or effusion to suggest failure. Pacing wires are stable.  Linear airspace disease is noted in the medial left lower lobe.  IMPRESSION:  1.  Satisfactory positioning of a new right IJ line without evidence for pneumothorax. 2.  Cardiac enlargement is exaggerated by low lung volumes without evidence for failure. 3.  Medial left lower lobe  airspace disease likely reflects atelectasis.  Early infection is considered less likely.   Original Report Authenticated By: Marin Roberts, M.D.    Assessment/Plan: Postop labs are stable clinically patient appears to be doing well 1 day after craniectomy. Plan to mobilize today  LOS: 1 day  Mobilize out of bed today discontinue Foley catheter. His blood pressure remained stable and patient is ambulatory may transfer to floor this afternoon. Where he of issues regarding dysautonomia.   Ashyah Quizon J 10/01/2012, 9:46 AM

## 2012-10-01 NOTE — Consult Note (Addendum)
Patient ID: [name] MRN: [id number], DOB/AGE: Mar 01, 1956   Admit date: 09/30/2012 Date of Consult: @TODAY @  Primary Physician: Lorretta Harp, MD Primary Cardiologist: Suzan Nailer, [name]   Problem List: Past Medical History  Diagnosis Date  . 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 Hypoglycemia, unspecified   . Other nonspecific abnormal serum enzyme levels   . Cervicalgia   . Disturbance of skin sensation   . Other specified congenital anomalies of nervous system   . Swelling of limb   . Disorder of bone and cartilage, unspecified   . Atrial tachycardia   . CVA (cerebral infarction)     2012 with dizziness and vision change felt embolic from atrial tachy  . History of pacemaker   . POTS (postural orthostatic tachycardia syndrome)   . Diverticulosis   . Skin cancer   . Osteopenia   . DVT (deep venous thrombosis)   . Atrial fibrillation   . DM (diabetes mellitus)   . DVT (deep venous thrombosis)   . HLD (hyperlipidemia)   . HTN (hypertension)   . Pacemaker     autonomic dysfunction  . Complication of anesthesia     pt states wakes up with "shakes"  . PONV (postoperative nausea and vomiting)   . Stroke     left weaker     Past Surgical History  Procedure Laterality Date  . Insert / replace / remove pacemaker  2012    1999, x 3  . Ablation saphenous vein w/ rfa      2002  . Tonsillectomy and adenoidectomy    . Carpal tunnel release      left  . Nose surgery  2010, 2012    for nose bleeds x 2  . Fatty tumor removed      from chest  . Melanoma excision      with removal of lymph nodes, left shoulder  . Deep axillary sentinel node biopsy / excision      due to extensive Melanoma-right arm  . Laparoscopy  09/06/2011    Procedure: LAPAROSCOPY OPERATIVE;  Surgeon: Tresa Endo A. Ernestina Penna, MD;  Location: [facility];  Service: Gynecology;  Laterality: Left;  with Left Ovarian Cystectomy      Allergies:  Allergies  Allergen  Reactions  . Doxycycline Hives  . Erythromycin Hives  . Iohexol      Desc: pt. states allergic to dye,-did fine with pre-meds. 02/13/05 06/14/10...pt did not state allergy to contrast, no premeds given, pt tolerated contrast.   . Penicillins Hives  . Preparation H (Pramox-Pe-Glycerin-Petrolatum) Hives    Cannot use  Cream or Suppositories   . Sulfonamide Derivatives Hives    HPI:  Patient is a 57 yo who I follow in cardiology clinic.  Her primary cardiologist is [name] MD [facility])  She is also followed by Odessa Fleming.   She has a history of signif autonomic dysfunction (hyperadrenergic POTS).  She also has a history of atrial arrhythmias including atrial fib and a hx of CVA several years ago (felt embolic.Marland Kitchen I saw hier in clinic in Feb 2014.  The patient has done fairly well from an autonomic standpoint though she has been slow to recover after a CVA she suffered several years ago (energy has not been the same).  She is managed at home with additional IV supplementation (2L NS IV every 2 weeks).  Beyond BP/HR she has evidence of systemic involvemnt with signif GI issues (followed by  D Brodie) and urinary problems (retention)  She is now POD # 1 from removal of a cerebellar tumor.   She notes some pain.  Denies dizziness in bed  Anxious about getting up.   Inpatient Medications:  . atorvastatin  10 mg Oral q1800  . clonazePAM  0.5 mg Oral BID  . cloNIDine  0.1 mg Oral QHS  . dexamethasone  2 mg Oral Q6H  . insulin aspart  0-12 Units Subcutaneous TID WC  . metoprolol tartrate  50 mg Oral Daily  . morphine   Intravenous Q4H  . PHENobarbital  64.8 mg Oral QHS  . pneumococcal 23 valent vaccine  0.5 mL Intramuscular Tomorrow-1000  . polyethylene glycol  17 g Oral QHS  . potassium chloride SA  20 mEq Oral Daily    Family History  Problem Relation Age of Onset  . Heart disease Father   . Diabetes Maternal Grandmother   . Diabetes Paternal Grandmother   . Stroke Mother    . Colon polyps Mother   . Clotting disorder Maternal Grandmother     stroke  . Crohn's disease Maternal Grandmother   . Diabetes Father   . Kidney disease Father      History   Social History  . Marital Status: Married    Spouse Name: N/A    Number of Children: 0  . Years of Education: N/A   Occupational History  . PRESIDENT     self employed   Social History Main Topics  . Smoking status: Never Smoker   . Smokeless tobacco: Never Used  . Alcohol Use: Yes     Comment: glass of wine daily  . Drug Use: No  . Sexually Active: Not on file   Other Topics Concern  . Not on file   Social History Narrative  . No narrative on file     Review of Systems: All other systems reviewed and are otherwise negative except as noted above.  Physical Exam: Filed Vitals:   10/01/12 1300  BP: 105/59  Pulse:   Temp:   Resp: 15    Intake/Output Summary (Last 24 hours) at 10/01/12 1336 Last data filed at 10/01/12 1300  Gross per 24 hour  Intake 3641.67 ml  Output   4195 ml  Net -553.33 ml    General: Well developed, well nourished, in no acute distress. Head:  Neck: Negative for carotid bruits. JVP not elevated. Lungs: Clear bilaterally to auscultation without wheezes, rales, or rhonchi. Breathing is unlabored. Heart: RRR with S1 S2. No murmurs, rubs, or gallops appreciated. Abdomen: Soft, non-tender, non-distended with normoactive bowel sounds. No hepatomegaly. No rebound/guarding. No obvious abdominal masses. Msk:  Strength and tone appears normal for age. Extremities: No clubbing, cyanosis or edema. Feet cool  Distal pedal pulses are 2+ and equal bilaterally. Neuro: Alert and oriented X 3. Moves all extremities spontaneously. Psych:  Responds to questions appropriately with a normal affect.  Labs: Results for orders placed during the hospital encounter of 09/30/12 (from the past 24 hour(s))  GLUCOSE, CAPILLARY     Status: Abnormal   Collection Time    09/30/12  3:48  PM      Result Value Range   Glucose-Capillary 126 (*) 70 - 99 mg/dL  BASIC METABOLIC PANEL     Status: Abnormal   Collection Time    10/01/12  5:00 AM      Result Value Range   Sodium 139  135 - 145 mEq/L   Potassium 4.0  3.5 -  5.1 mEq/L   Chloride 106  96 - 112 mEq/L   CO2 24  19 - 32 mEq/L   Glucose, Bld 125 (*) 70 - 99 mg/dL   BUN 10  6 - 23 mg/dL   Creatinine, Ser 5.62 (*) 0.50 - 1.10 mg/dL   Calcium 9.2  8.4 - 13.0 mg/dL   GFR calc non Af Amer >90  >90 mL/min   GFR calc Af Amer >90  >90 mL/min  CBC     Status: Abnormal   Collection Time    10/01/12  5:00 AM      Result Value Range   WBC 13.1 (*) 4.0 - 10.5 K/uL   RBC 4.11  3.87 - 5.11 MIL/uL   Hemoglobin 12.3  12.0 - 15.0 g/dL   HCT 86.5  78.4 - 69.6 %   MCV 89.5  78.0 - 100.0 fL   MCH 29.9  26.0 - 34.0 pg   MCHC 33.4  30.0 - 36.0 g/dL   RDW 29.5  28.4 - 13.2 %   Platelets 214  150 - 400 K/uL  GLUCOSE, CAPILLARY     Status: Abnormal   Collection Time    10/01/12  1:10 PM      Result Value Range   Glucose-Capillary 103 (*) 70 - 99 mg/dL    Radiology/Studies: Dg Chest 2 View   Dg Chest Port 1 View  09/30/2012   *RADIOLOGY REPORT*  Clinical Data: Central line placement.  PORTABLE CHEST - 1 VIEW  Comparison: Two-view chest 09/07/2012.  Findings: A new right IJ line terminates just above the cavoatrial junction.  There is no pneumothorax.  Low lung volumes exaggerate the heart size.  There is no edema or effusion to suggest failure. Pacing wires are stable.  Linear airspace disease is noted in the medial left lower lobe.  IMPRESSION:  1.  Satisfactory positioning of a new right IJ line without evidence for pneumothorax. 2.  Cardiac enlargement is exaggerated by low lung volumes without evidence for failure. 3.  Medial left lower lobe airspace disease likely reflects atelectasis.  Early infection is considered less likely.   Original Report Authenticated By: Marin Roberts, M.D.    EKG: 8/4  Atrial paced.  75 bpm.     ASSESSMENT AND PLAN:   1.  Autonomic dysfunction Patient  has a long history of severe autonomic dysfunction  Managed with medicines and IV fluids at home.  Now s/p surgery. BP has been a little low at times.  She has not had that much intake (po or IV)  Recomm:  Hold metoprolol for now  Keep on Clonidine and Klonopin and Phenobarb Bump IV rate (switch to NS).   May need to bolus depending on BP As BP allows add back low doses of metoprolol.  2.  Atrial arrhythmias   Maintaining SR.  Follow.  Had been on Xarelto preoperatively  On hold now.    3.  HTN  Follow.    Signed, Dietrich Pates 10/01/2012, 1:36 PM

## 2012-10-01 NOTE — Transfer of Care (Signed)
Immediate Anesthesia Transfer of Care Note  Patient: Skarlett Sedlacek Hole  Procedure(s) Performed: Procedure(s) with comments: Suboccipital Craniectomy for resection of tumor (N/A) - Suboccipital Craniectomy for resection of tumor  Patient Location: PACU  Anesthesia Type:General  Level of Consciousness: awake, alert , oriented and patient cooperative  Airway & Oxygen Therapy: Patient Spontanous Breathing and Patient connected to nasal cannula oxygen  Post-op Assessment: Report given to PACU RN, Post -op Vital signs reviewed and stable, Patient moving all extremities X 4 and Patient able to stick tongue midline  Post vital signs: Reviewed and stable  Complications: No apparent anesthesia complications

## 2012-10-02 ENCOUNTER — Encounter (HOSPITAL_COMMUNITY): Payer: Self-pay | Admitting: Neurological Surgery

## 2012-10-02 ENCOUNTER — Other Ambulatory Visit: Payer: Self-pay | Admitting: Oncology

## 2012-10-02 DIAGNOSIS — G909 Disorder of the autonomic nervous system, unspecified: Secondary | ICD-10-CM

## 2012-10-02 LAB — GLUCOSE, CAPILLARY
Glucose-Capillary: 120 mg/dL — ABNORMAL HIGH (ref 70–99)
Glucose-Capillary: 173 mg/dL — ABNORMAL HIGH (ref 70–99)
Glucose-Capillary: 172 mg/dL — ABNORMAL HIGH (ref 70–99)
Glucose-Capillary: 139 mg/dL — ABNORMAL HIGH (ref 70–99)

## 2012-10-02 MED ORDER — PNEUMOCOCCAL VAC POLYVALENT 25 MCG/0.5ML IJ INJ
0.5000 mL | INJECTION | INTRAMUSCULAR | Status: AC
  Administered 2012-10-03: 0.5 mL via INTRAMUSCULAR
  Filled 2012-10-02: qty 0.5

## 2012-10-02 NOTE — Progress Notes (Signed)
UR completed 

## 2012-10-02 NOTE — Progress Notes (Signed)
Subjective: Patient denies dizziness  Sitting in chair.  Breathing is OK  Walked earlier Objective: Filed Vitals:   10/02/12 1300 10/02/12 1400 10/02/12 1500 10/02/12 1543  BP: 114/77 111/62 105/69   Pulse:      Temp:    98.2 F (36.8 C)  TempSrc:    Oral  Resp: 21 18 15    Height:      Weight:      SpO2: 98% 99% 98%    Weight change:   Intake/Output Summary (Last 24 hours) at 10/02/12 1601 Last data filed at 10/02/12 1200  Gross per 24 hour  Intake   1901 ml  Output   3675 ml  Net  -1774 ml    General: Alert, awake, oriented x3, in no acute distress Neck:  JVP is normal Heart: Regular rate and rhythm, without murmurs, rubs, gallops.  Lungs: Clear to auscultation.  No rales or wheezes. Exemities:  No edema.   Neuro: Grossly intact, nonfocal.  Tele:  SR to ST  Lab Results: Results for orders placed during the hospital encounter of 09/30/12 (from the past 24 hour(s))  GLUCOSE, CAPILLARY     Status: Abnormal   Collection Time    10/01/12  5:59 PM      Result Value Range   Glucose-Capillary 122 (*) 70 - 99 mg/dL  GLUCOSE, CAPILLARY     Status: Abnormal   Collection Time    10/01/12 10:05 PM      Result Value Range   Glucose-Capillary 151 (*) 70 - 99 mg/dL   Comment 1 Notify RN     Comment 2 Documented in Chart    GLUCOSE, CAPILLARY     Status: Abnormal   Collection Time    10/02/12  8:22 AM      Result Value Range   Glucose-Capillary 120 (*) 70 - 99 mg/dL  GLUCOSE, CAPILLARY     Status: Abnormal   Collection Time    10/02/12 11:28 AM      Result Value Range   Glucose-Capillary 173 (*) 70 - 99 mg/dL    Studies/Results: @RISRSLT24 @  Medications:  Reviewed   @PROBHOSP @ 1.  Autonomic dysfunction.  Patient deneis dizziness.  BP is better than yesterday  She has received IVF but continues to diurese on own.  I would rec pullng back on IV fluids now that taking po  Follow  2.  Atrial arrhythmias.  Patient has had non.  Would recomm starting lopressor 12.5 bid   Keep on very low dose  Follow BP  3  HTN  Follow.  I am in [place] at [facility] tomorrow.  Will follow hemodynamics peripherally.    LOS: 2 days   Dietrich Pates 10/02/2012, 4:01 PM

## 2012-10-02 NOTE — Progress Notes (Signed)
Called Dr. Phoebe Perch regarding patient's pain medications.  Order is written for 1-2 tabs (5-10mg ) of Vicodin q 6 hours.  Patient only desires to take 1 at a time but q 6 hour dosing is too far apart.  Dr. Phoebe Perch gave the verbal order to enable me to give patient the 2nd 5mg  tablet within the 6 hour window. Bernd Crom C

## 2012-10-02 NOTE — Progress Notes (Signed)
Pt. Is not using the morphine PCA, therefore Eco2 monitor is turned off. Will ask MD about discontinuing the  PCA when he rounds.

## 2012-10-02 NOTE — Progress Notes (Signed)
Patient is trying to wean herself off of the PCA morphine and just use PO medications. PCA is still available but for now will try to use just PO meds to treat pain. If successful will discuss ask MD to D/C PCA morphine.

## 2012-10-02 NOTE — Progress Notes (Signed)
Subjective: Patient denies dizziness  Sitting in chair.  Breathing is OK  Walked earlier Objective: Filed Vitals:   10/02/12 1000 10/02/12 1100 10/02/12 1109 10/02/12 1200  BP: 112/67 103/59  106/66  Pulse:      Temp:      TempSrc:      Resp: 19 14 19 15   Height:      Weight:      SpO2: 98% 99% 98% 99%   Weight change:   Intake/Output Summary (Last 24 hours) at 10/02/12 1304 Last data filed at 10/02/12 1200  Gross per 24 hour  Intake 2239.75 ml  Output   4515 ml  Net -2275.25 ml    General: Alert, awake, oriented x3, in no acute distress Neck:  JVP is normal Heart: Regular rate and rhythm, without murmurs, rubs, gallops.  Lungs: Clear to auscultation.  No rales or wheezes. Exemities:  No edema.   Neuro: Grossly intact, nonfocal.  Tele:  SR to ST  Lab Results: Results for orders placed during the hospital encounter of 09/30/12 (from the past 24 hour(s))  GLUCOSE, CAPILLARY     Status: Abnormal   Collection Time    10/01/12  1:10 PM      Result Value Range   Glucose-Capillary 103 (*) 70 - 99 mg/dL  GLUCOSE, CAPILLARY     Status: Abnormal   Collection Time    10/01/12  5:59 PM      Result Value Range   Glucose-Capillary 122 (*) 70 - 99 mg/dL  GLUCOSE, CAPILLARY     Status: Abnormal   Collection Time    10/01/12 10:05 PM      Result Value Range   Glucose-Capillary 151 (*) 70 - 99 mg/dL   Comment 1 Notify RN     Comment 2 Documented in Chart    GLUCOSE, CAPILLARY     Status: Abnormal   Collection Time    10/02/12  8:22 AM      Result Value Range   Glucose-Capillary 120 (*) 70 - 99 mg/dL  GLUCOSE, CAPILLARY     Status: Abnormal   Collection Time    10/02/12 11:28 AM      Result Value Range   Glucose-Capillary 173 (*) 70 - 99 mg/dL    Studies/Results: @RISRSLT24 @  Medications:  Reviewed   @PROBHOSP @ 1.  Autonomic dysfunction.  Patient deneis dizziness.  BP is better than yesterday  She has received IVF but continues to diurese on own.  I would rec  pullng back on IV fluids now that taking po  Follow  2.  Atrial arrhythmias.  Patient has had non.  Would recomm starting lopressor 12.5 bid  Keep on very low dose  Follow BP  3  HTN  Follow.  LOS: 2 days   Dietrich Pates 10/02/2012, 1:04 PM

## 2012-10-03 ENCOUNTER — Encounter: Payer: Self-pay | Admitting: Internal Medicine

## 2012-10-03 LAB — GLUCOSE, CAPILLARY
Glucose-Capillary: 102 mg/dL — ABNORMAL HIGH (ref 70–99)
Glucose-Capillary: 118 mg/dL — ABNORMAL HIGH (ref 70–99)
Glucose-Capillary: 120 mg/dL — ABNORMAL HIGH (ref 70–99)
Glucose-Capillary: 124 mg/dL — ABNORMAL HIGH (ref 70–99)

## 2012-10-03 MED ORDER — METOPROLOL TARTRATE 12.5 MG HALF TABLET
12.5000 mg | ORAL_TABLET | Freq: Two times a day (BID) | ORAL | Status: DC
  Administered 2012-10-03 – 2012-10-04 (×3): 12.5 mg via ORAL
  Filled 2012-10-03 (×5): qty 1

## 2012-10-03 MED ORDER — DEXAMETHASONE 2 MG PO TABS
2.0000 mg | ORAL_TABLET | Freq: Three times a day (TID) | ORAL | Status: DC
  Administered 2012-10-03 – 2012-10-04 (×3): 2 mg via ORAL
  Filled 2012-10-03 (×6): qty 1

## 2012-10-03 NOTE — Progress Notes (Signed)
[facility]  Patient Status:   Active pt with [facility] up to this readmission  [facility] is providing the following services: [facility] RN and [facility].  [facility] Coordinators will follow pt and support DC back home when deemed appropriate.  If patient discharges after hours, please call [phone].   Sedalia Muta 10/03/2012, 1:13 PM

## 2012-10-03 NOTE — Progress Notes (Signed)
She has done very well post operatively without any medical issues that require our ongoing assistance in her care. Therefore, PCCM will sign off. Please call if we can be of further assistance   Billy Fischer, MD ; [facility] [phone].  After 5:30 PM or weekends, call [phone]

## 2012-10-03 NOTE — Progress Notes (Signed)
Subjective: Patient sitting up without complaints. Has been ambulating in the ICU.  Objective: Vital signs in last 24 hours: Filed Vitals:   10/03/12 0700 10/03/12 0746 10/03/12 0800 10/03/12 0900  BP: 121/98  101/74 105/53  Pulse:      Temp:  98 F (36.7 C)    TempSrc:  Oral    Resp: 20  15 18   Height:      Weight:      SpO2: 97%  96% 99%    Intake/Output from previous day: 08/07 0701 - 08/08 0700 In: 388.5 [I.V.:388.5] Out: 2525 [Urine:2525] Intake/Output this shift: Total I/O In: 240 [P.O.:240] Out: 450 [Urine:450]  Physical Exam:  Awake alert, oriented. Moving all 4 extremities well. Dressing removed, and wound healing nicely.  CBC  Recent Labs  10/01/12 0500  WBC 13.1*  HGB 12.3  HCT 36.8  PLT 214   BMET  Recent Labs  10/01/12 0500  NA 139  K 4.0  CL 106  CO2 24  GLUCOSE 125*  BUN 10  CREATININE 0.47*  CALCIUM 9.2    Assessment/Plan: We'll continue Decadron taper, and decrease to 2 mg every 8 hours. Encouraged to ambulate actively in the ICU.   Hewitt Shorts, MD 10/03/2012, 10:22 AM

## 2012-10-04 LAB — GLUCOSE, CAPILLARY: Glucose-Capillary: 148 mg/dL — ABNORMAL HIGH (ref 70–99)

## 2012-10-04 MED ORDER — DEXAMETHASONE 1 MG PO TABS: ORAL_TABLET | ORAL | Status: DC

## 2012-10-04 MED ORDER — HYDROCODONE-ACETAMINOPHEN 5-325 MG PO TABS: 1.0000 | ORAL_TABLET | Freq: Four times a day (QID) | ORAL | Status: DC | PRN

## 2012-10-04 MED ORDER — CLONAZEPAM 0.5 MG PO TABS: 0.5000 mg | ORAL_TABLET | Freq: Two times a day (BID) | ORAL | Status: DC

## 2012-10-04 NOTE — Progress Notes (Signed)
   CARE MANAGEMENT NOTE 10/04/2012  Patient:  LASHE, OLIVEIRA   Account Number:  [id number]  Date Initiated:  10/04/2012  Documentation initiated by:  [facility]  Subjective/Objective Assessment:   Adm with Cerebellar lesion     Action/Plan:   Anticipated DC Date:  10/04/2012   Anticipated DC Plan:  HOME W HOME HEALTH SERVICES      DC Planning Services  CM consult      [facility] Choice  Resumption Of Svcs/PTA Provider   Choice offered to / List presented to:          [facility] arranged  HH-1 RN  HH-2 PT      [facility] agency  [facility]   Status of service:  Completed, signed off Medicare Important Message given?   (If response is "NO", the following Medicare IM given date fields will be blank) Date Medicare IM given:   Date Additional Medicare IM given:    Discharge Disposition:  HOME W HOME HEALTH SERVICES  Per UR Regulation:    If discussed at Long Length of Stay Meetings, dates discussed:    Comments:  10-04-12 16:50 Pt discharged  prior to orders for Resumption of  Home Health orders written by MD.  We recieved call from [facility] requesting home health orders be placed before home health could be resumed.  Called RN to get [facility] PT and RN for IV hydration resumption order. Resumption orders were obtained and faxed to [facility].   Freddy Jaksch, BSN, CM

## 2012-10-04 NOTE — Progress Notes (Signed)
Patient looks good  No dizziness  Has maintained SR I will continue to follow patient closely post discharge.  I have pulled back on lopressor to 12.5 bid  WIll call in Rx to [facility] for 25 mg that she can cut in 1/2. Keep on other meds. Follow BP and HR as outpatient ant titrate as tolerates Will discuss with Odessa Fleming (returns to [place] on Monday).

## 2012-10-04 NOTE — Discharge Summary (Signed)
Physician Discharge Summary  Patient ID: [name] MRN: [id number] DOB/AGE: 10-21-1955 57 y.o.  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 She tolerated the surgery well the mass was found to be metastatic melanoma. The patient has underlying dysautonomia. Her blood pressure and vital signs initially were quite low and somewhat labile. She was seen in consultation by cardiology. Critical care medicine also was involved. Her condition stabilized and she was ambulated on the second third and fourth hospital days. She is discharged on the fourth hospital day.  Consults: Critical care medicine, cardiology  Significant Diagnostic Studies: CT of the head with and without contrast  Treatments: Craniectomy for resection of lesion.  Discharge Exam: Blood pressure 111/72, pulse 70, temperature 98.1 F (36.7 C), temperature source Oral, resp. rate 17, height 5\' 3"  (1.6 m), weight 72.2 kg (159 lb 2.8 oz), SpO2 100.00%. Rapid alternating movements intact station and gait are quite normal.  Disposition: 01-Home or Self Care  Discharge Orders   Future Appointments Provider Department Dept Phone   10/09/2012 8:30 AM [facility] Nurse [facility] [phone]   10/09/2012 9:00 AM Jonna Coup, MD [facility] [phone]   10/24/2012 1:00 PM Jonna Coup, MD [facility] [phone]   Joint Appt [facility] [phone]   Future Orders Complete By  Expires     Call MD for:  redness, tenderness, or signs of infection (pain, swelling, redness, odor or green/yellow discharge around incision site)  As directed     Call MD for:  severe uncontrolled pain  As directed     Call MD for:  temperature >100.4  As directed     Cane  As directed     Diet - low sodium heart healthy  As directed     Discharge instructions  As directed     Comments:      Okay to shower. Do not apply salves or appointments to incision. No heavy lifting with the upper extremities greater than 15 pounds. May resume driving when not requiring pain medication in two weeks if patient feels up to it.    Increase activity slowly  As directed         Medication List    STOP taking these medications       dexamethasone 2 MG tablet  Commonly known as:  DECADRON  Replaced by:  dexamethasone 1 MG tablet      TAKE these medications       acetaminophen 500 MG tablet  Commonly known as:  TYLENOL  Take 1,000 mg by mouth every 6 (six) hours as needed for pain.     Cholecalciferol 50000 UNITS Tabs  Take 50,000 Units by mouth every 7 (seven) days. On Saturdays (from [facility])     clonazePAM 0.5 MG tablet  Commonly known as:  KLONOPIN  Take 1 tablet (0.5 mg total) by mouth 2 (two) times daily.     clonazePAM 0.5 MG tablet  Commonly known as:  KLONOPIN  Take 0.5 mg by mouth 2 (two) times daily.     cloNIDine  0.1 MG tablet  Commonly known as:  CATAPRES  Take 0.1 mg by mouth at bedtime.     cyanocobalamin 1000 MCG/ML injection  Commonly known as:  (VITAMIN B-12)  Inject 1,000 mcg into the muscle every 30 (thirty) days. Last injection 09/21/12     dexamethasone 1 MG tablet  Commonly known as:  DECADRON  1 tablet 3 times a day for 5 days then 1 tablet twice a day for 5 days then 1 tablet daily     HYDROcodone-acetaminophen 5-325 MG per tablet  Commonly known as:  NORCO/VICODIN  Take 1-2 tablets by mouth every 6 (six) hours as needed.      HYDROcodone-acetaminophen 5-325 MG per tablet  Commonly known as:  NORCO/VICODIN  Take 1 tablet by mouth every 6 (six) hours as needed for pain.     hydrocortisone 25 MG suppository  Commonly known as:  ANUSOL-HC  Place 25 mg rectally at bedtime as needed for hemorrhoids.     hydrocortisone-pramoxine 2.5-1 % rectal cream  Commonly known as:  ANALPRAM-HC  Place 1 application rectally at bedtime as needed for hemorrhoids.     mesalamine 1000 MG suppository  Commonly known as:  CANASA  Place 1,000 mg rectally at bedtime as needed (hemorrhoids).     metoprolol 50 MG tablet  Commonly known as:  LOPRESSOR  Take 50 mg by mouth See admin instructions. Take 1 tablet (50 mg) every morning and a 2nd tablet in the  evening as needed for diastolic blood pressure >100     ondansetron 4 MG disintegrating tablet  Commonly known as:  ZOFRAN-ODT  Take 4 mg by mouth daily as needed for nausea.     pantoprazole 40 MG tablet  Commonly known as:  PROTONIX  Take 40 mg by mouth daily as needed (acid reflux).     PHENobarbital 64.8 MG tablet  Commonly known as:  LUMINAL  Take 1 tablet (64.8 mg total) by mouth at bedtime.     polyethylene glycol packet  Commonly known as:  MIRALAX / GLYCOLAX  Take 17 g by mouth at bedtime. Mix with 8 oz of liquid and drink     potassium chloride SA 20 MEQ tablet  Commonly known as:  K-DUR,KLOR-CON  Take 20 mEq by mouth daily.     rosuvastatin 5 MG tablet  Commonly known as:  CRESTOR  Take 1 tablet (5 mg total) by mouth daily.         SignedStefani Dama 10/04/2012, 12:14 PM

## 2012-10-07 ENCOUNTER — Encounter: Payer: Self-pay | Admitting: Oncology

## 2012-10-07 NOTE — Progress Notes (Unsigned)
Location/Histology of Brain Tumor: Left Cerebellar Mass - metastatic melenoma  Patient presented with symptoms of:  Staggering gait an unsteadiness.  Past or anticipated interventions, if any, per neurosurgery: suboccipital craniectomy by Dr. Danielle Dess on 09/30/2012  Past or anticipated interventions, if any, per medical oncology: ***  Dose of Decadron, if applicable: ***  Recent neurologic symptoms, if any:   Seizures: ***  Headaches: ***  Nausea: ***  Dizziness/ataxia: ***  Difficulty with hand coordination: ***  Focal numbness/weakness: ***  Visual deficits/changes: ***  Confusion/Memory deficits: ***  Painful bone metastases at present, if any: ***  SAFETY ISSUES:  Prior radiation? no  Pacemaker/ICD? yes  Possible current pregnancy? no  Is the patient on methotrexate? no  Additional Complaints / other details: ***

## 2012-10-08 ENCOUNTER — Telehealth: Payer: Self-pay | Admitting: Internal Medicine

## 2012-10-08 NOTE — Telephone Encounter (Signed)
Spoke with susan, aware the pts device is not MRI compatible. She says dr Danielle Dess and dr Graciela Husbands had discussed this pt and her having an MRI. Will discuss with dr Graciela Husbands and let them know.

## 2012-10-08 NOTE — Telephone Encounter (Signed)
New problem    Patient will be having a brain mri pending 8/27 .h/o pacemaker.  Please advise if someone should be there with her or not .

## 2012-10-09 ENCOUNTER — Ambulatory Visit
Admission: RE | Admit: 2012-10-09 | Discharge: 2012-10-09 | Disposition: A | Discharge status: Home / Self Care | Payer: BC Managed Care – PPO | Source: Ambulatory Visit | Attending: Radiation Oncology | Admitting: Radiation Oncology

## 2012-10-09 ENCOUNTER — Encounter: Payer: Self-pay | Admitting: Radiation Oncology

## 2012-10-09 ENCOUNTER — Telehealth: Payer: Self-pay | Admitting: Radiation Oncology

## 2012-10-09 ENCOUNTER — Telehealth: Payer: Self-pay | Admitting: Oncology

## 2012-10-09 VITALS — BP 102/70 | HR 65 | Temp 97.4°F | Resp 16 | Ht 63.0 in | Wt 157.3 lb

## 2012-10-09 DIAGNOSIS — Z85820 Personal history of malignant melanoma of skin: Secondary | ICD-10-CM

## 2012-10-09 DIAGNOSIS — C7949 Secondary malignant neoplasm of other parts of nervous system: Secondary | ICD-10-CM | POA: Insufficient documentation

## 2012-10-09 DIAGNOSIS — E119 Type 2 diabetes mellitus without complications: Secondary | ICD-10-CM | POA: Insufficient documentation

## 2012-10-09 DIAGNOSIS — I951 Orthostatic hypotension: Secondary | ICD-10-CM | POA: Insufficient documentation

## 2012-10-09 DIAGNOSIS — C7931 Secondary malignant neoplasm of brain: Secondary | ICD-10-CM | POA: Insufficient documentation

## 2012-10-09 DIAGNOSIS — I10 Essential (primary) hypertension: Secondary | ICD-10-CM | POA: Insufficient documentation

## 2012-10-09 DIAGNOSIS — I498 Other specified cardiac arrhythmias: Secondary | ICD-10-CM | POA: Insufficient documentation

## 2012-10-09 DIAGNOSIS — Z86718 Personal history of other venous thrombosis and embolism: Secondary | ICD-10-CM | POA: Insufficient documentation

## 2012-10-09 DIAGNOSIS — K5730 Diverticulosis of large intestine without perforation or abscess without bleeding: Secondary | ICD-10-CM | POA: Insufficient documentation

## 2012-10-09 DIAGNOSIS — Z79899 Other long term (current) drug therapy: Secondary | ICD-10-CM | POA: Insufficient documentation

## 2012-10-09 NOTE — Progress Notes (Signed)
Denies seizure activity. Denies taking keppra. Reports that she has been on phenobarbital for years. Reports daily mild headache at incision site. Occipital vertical incision well approximated without redness, drainage or edema. Denies nausea or vomiting. Denies dizziness. Denies difficulty with hand coordination but, reports that occasionally she drops things. Floater present since surgery but, are improving. Denies confusion or memory changes. Reports neck pain below incision site 3 on a scale of 0-10 for which she take 1/2 vicodin tablet. Reports taking decadron 1 mg bid.

## 2012-10-09 NOTE — Progress Notes (Signed)
See progress note under physician encounter. 

## 2012-10-09 NOTE — Telephone Encounter (Signed)
Received a message from [facility] requesting that a copy of her path report be faxed to her at [phone].  She stated that Dr. Mitzi Hansen was going to get her a copy but she forgot to ask for it at the end of the consult.  Notified Debara Pickett, who faxed it to her.

## 2012-10-09 NOTE — Progress Notes (Signed)
Complete PATIENT MEASURE OF DISTRESS worksheet with a score of 4 submitted to social work.

## 2012-10-09 NOTE — Telephone Encounter (Signed)
Faxed pathology 09/30/12 to pt., [phone].  Per Clydie Braun, RN, [place] per [employer].  Received confirmation.

## 2012-10-10 ENCOUNTER — Institutional Professional Consult (permissible substitution): Payer: BC Managed Care – PPO | Admitting: Radiation Oncology

## 2012-10-10 ENCOUNTER — Other Ambulatory Visit: Payer: Self-pay | Admitting: Radiation Therapy

## 2012-10-10 DIAGNOSIS — C7931 Secondary malignant neoplasm of brain: Secondary | ICD-10-CM

## 2012-10-10 DIAGNOSIS — C7949 Secondary malignant neoplasm of other parts of nervous system: Secondary | ICD-10-CM

## 2012-10-13 ENCOUNTER — Other Ambulatory Visit: Payer: Self-pay | Admitting: Radiation Oncology

## 2012-10-14 ENCOUNTER — Institutional Professional Consult (permissible substitution): Payer: BC Managed Care – PPO | Admitting: Radiation Oncology

## 2012-10-14 ENCOUNTER — Encounter: Payer: Self-pay | Admitting: Radiation Oncology

## 2012-10-15 ENCOUNTER — Telehealth: Payer: Self-pay | Admitting: *Deleted

## 2012-10-15 NOTE — Telephone Encounter (Signed)
Per Dr. Truett Perna: Schedule pt for office visit about 2 weeks after radiation is complete. Request to schedulers.

## 2012-10-16 DIAGNOSIS — C7931 Secondary malignant neoplasm of brain: Secondary | ICD-10-CM | POA: Insufficient documentation

## 2012-10-16 NOTE — Telephone Encounter (Signed)
Dr. Graciela Husbands, has anything changed with the patient going forward with her MRI based on her device. Crystal from [facility] is calling I just wanted to make sure she is still ok with this with you being there.

## 2012-10-16 NOTE — Telephone Encounter (Signed)
New problem   Crystal/[facility] need to discuss pt having a MRI with her pacemaker. Please call Crystal

## 2012-10-16 NOTE — Progress Notes (Signed)
Radiation Oncology         [phone] ________________________________  Name: [name] MRN: [id number]  Date: 10/09/2012  DOB: 1955/12/15  MW:UXLKGM,WNUUV KOTVAN, MD  Barnett Abu, MD     REFERRING PHYSICIAN: Barnett Abu, MD   DIAGNOSIS: The encounter diagnosis was Personal history of malignant melanoma of skin.: Brain metastasis   HISTORY OF PRESENT ILLNESS::[name] is a 57 y.o. female who is seen for an initial consultation visit. The patient has a history of melanoma. She had a lesion excised from the right shoulder and a sentinel lymph node biopsy she states was completed which was negative. She has also had smaller melanomas removed both from the nose and the right leg by dermatology. The patient then noted some difficulties with some unsteadiness and a staggering gait. She also developed a headache for which she has taken Tylenol. A CT scan of the head was performed and this revealed a 3 x 2.6 severe mass in the left cerebellum. There also was a 8 mm enhancing lesion in the posterior left tonsil/fluoride. These 2 lesions were indicative of metastatic disease and the patient was then seen by neurosurgery who schedule the patient for resection. The patient workup also did include and on 09/23/1998 her teen which did not reveal any evidence of hypermetabolic disease within the chest for cough. The patient proceeded to undergo a resection of the cerebellar lesion on 09/30/2012. This surgery went well and the pathology returned positive for metastatic melanoma as expected. I therefore been asked to see the patient today for consideration of cranial radiation.   PREVIOUS RADIATION THERAPY: No   PAST MEDICAL HISTORY:  has a past medical history of Injury to unspecified nerve of shoulder girdle and upper limb; Hypoglycemia, unspecified; Other nonspecific abnormal serum enzyme levels; Cervicalgia; Disturbance of skin sensation; Other specified congenital anomalies of nervous  system; Swelling of limb; Disorder of bone and cartilage, unspecified; Atrial tachycardia; CVA (cerebral infarction); History of pacemaker; POTS (postural orthostatic tachycardia syndrome); Diverticulosis; Skin cancer; Osteopenia; DVT (deep venous thrombosis); Atrial fibrillation; DM (diabetes mellitus); DVT (deep venous thrombosis); HLD (hyperlipidemia); HTN (hypertension); Pacemaker; Complication of anesthesia; PONV (postoperative nausea and vomiting); and Stroke.     PAST SURGICAL HISTORY: Past Surgical History  Procedure Laterality Date  . Insert / replace / remove pacemaker  2012    1999, x 3  . Ablation saphenous vein w/ rfa      2002  . Tonsillectomy and adenoidectomy    . Carpal tunnel release      left  . Nose surgery  2010, 2012    for nose bleeds x 2  . Fatty tumor removed      from chest  . Melanoma excision      with removal of lymph nodes, left shoulder  . Deep axillary sentinel node biopsy / excision      due to extensive Melanoma-right arm  . Laparoscopy  09/06/2011    Procedure: LAPAROSCOPY OPERATIVE;  Surgeon: Tresa Endo A. Ernestina Penna, MD;  Location: [facility];  Service: Gynecology;  Laterality: Left;  with Left Ovarian Cystectomy   . Craniotomy N/A 09/30/2012    suboccipital craniectomy     FAMILY HISTORY: family history includes Clotting disorder in her maternal grandmother; Colon polyps in her mother; Crohn's disease in her maternal grandmother; Diabetes in her father, maternal grandmother, and paternal grandmother; Heart disease in her father; Kidney disease in her father; Stroke in her mother.   SOCIAL HISTORY:  reports that she has never  smoked. She has never used smokeless tobacco. She reports that  drinks alcohol. She reports that she does not use illicit drugs.   ALLERGIES: Doxycycline; Erythromycin; Iohexol; Penicillins; Preparation h; and Sulfonamide derivatives   MEDICATIONS:  Current Outpatient Prescriptions  Medication Sig Dispense Refill  . Cholecalciferol  50000 UNITS TABS Take 50,000 Units by mouth every 7 (seven) days. On Saturdays (from [facility])      . clonazePAM (KLONOPIN) 0.5 MG tablet Take 0.5 mg by mouth 2 (two) times daily.      . clonazePAM (KLONOPIN) 0.5 MG tablet Take 1 tablet (0.5 mg total) by mouth 2 (two) times daily.  30 tablet  0  . cloNIDine (CATAPRES) 0.1 MG tablet Take 0.1 mg by mouth at bedtime.       . cyanocobalamin (,VITAMIN B-12,) 1000 MCG/ML injection Inject 1,000 mcg into the muscle every 30 (thirty) days. Last injection 09/21/12      . dexamethasone (DECADRON) 1 MG tablet 1 tablet 3 times a day for 5 days then 1 tablet twice a day for 5 days then 1 tablet daily  60 tablet  3  . fluorouracil (EFUDEX) 5 % cream       . HYDROcodone-acetaminophen (NORCO/VICODIN) 5-325 MG per tablet Take 1-2 tablets by mouth every 6 (six) hours as needed.  30 tablet  0  . hydrocortisone (ANUSOL-HC) 25 MG suppository Place 25 mg rectally at bedtime as needed for hemorrhoids.      . hydrocortisone-pramoxine (ANALPRAM-HC) 2.5-1 % rectal cream Place 1 application rectally at bedtime as needed for hemorrhoids.      . mesalamine (CANASA) 1000 MG suppository Place 1,000 mg rectally at bedtime as needed (hemorrhoids).       . metoprolol (LOPRESSOR) 50 MG tablet Take 50 mg by mouth See admin instructions. Take 1 tablet (50 mg) every morning and a 2nd tablet in the  evening as needed for diastolic blood pressure >100      . ondansetron (ZOFRAN-ODT) 4 MG disintegrating tablet Take 4 mg by mouth daily as needed for nausea.      . pantoprazole (PROTONIX) 40 MG tablet Take 40 mg by mouth daily as needed (acid reflux).       Marland Kitchen PHENobarbital (LUMINAL) 64.8 MG tablet Take 1 tablet (64.8 mg total) by mouth at bedtime.  90 tablet  3  . polyethylene glycol (MIRALAX / GLYCOLAX) packet Take 17 g by mouth at bedtime. Mix with 8 oz of liquid and drink      . potassium chloride SA (K-DUR,KLOR-CON) 20 MEQ tablet Take 20 mEq by mouth daily.       . rosuvastatin  (CRESTOR) 5 MG tablet Take 1 tablet (5 mg total) by mouth daily.  90 tablet  3  . acetaminophen (TYLENOL) 500 MG tablet Take 1,000 mg by mouth every 6 (six) hours as needed for pain.      Marland Kitchen HYDROcodone-acetaminophen (NORCO/VICODIN) 5-325 MG per tablet Take 1 tablet by mouth every 6 (six) hours as needed for pain.       No current facility-administered medications for this encounter.     REVIEW OF SYSTEMS:  A 15 point review of systems is documented in the electronic medical record. This was obtained by the nursing staff. However, I reviewed this with the patient to discuss relevant findings and make appropriate changes.  Pertinent items are noted in HPI.    PHYSICAL EXAM:  height is 5\' 3"  (1.6 m) and weight is 157 lb 4.8 oz (71.351 kg). Her  oral temperature is 97.4 F (36.3 C). Her blood pressure is 102/70 and her pulse is 65. Her respiration is 16 and oxygen saturation is 100%.   General: Well-developed, in no acute distress HEENT: Normocephalic, atraumatic; oral cavity clear; scar healing well Neck: Supple without any lymphadenopathy Cardiovascular: Regular rate and rhythm Respiratory: Clear to auscultation bilaterally GI: Soft, nontender, normal bowel sounds Extremities: No edema present Neuro: No focal deficits     LABORATORY DATA:  Lab Results  Component Value Date   WBC 13.1* 10/01/2012   HGB 12.3 10/01/2012   HCT 36.8 10/01/2012   MCV 89.5 10/01/2012   PLT 214 10/01/2012   Lab Results  Component Value Date   NA 139 10/01/2012   K 4.0 10/01/2012   CL 106 10/01/2012   CO2 24 10/01/2012   Lab Results  Component Value Date   ALT 20 09/06/2011   AST 18 09/06/2011   ALKPHOS 69 09/06/2011   BILITOT 0.2* 09/06/2011      RADIOGRAPHY: Nm Pet Image Initial (pi) Whole Body  09/22/2012   Certain n m pet whole body *RADIOLOGY REPORT*  Clinical Data:  Subsequent treatment strategy for melanoma.  The patient had previous excision of right shoulder melanoma and 2009. Now with and intracranial  lesion on recent head CT and two tiny lung nodules seen on chest CT.  NUCLEAR MEDICINE PET WHOLE BODY  Fasting Blood Glucose:  110  Technique:  17.2 mCi F-18 FDG was injected intravenously. CT data was obtained and used for attenuation correction and anatomic localization only.  (This was not acquired as a diagnostic CT examination.) Additional exam technical data entered on technologist worksheet.  Comparison:  None.  Findings:  Head/Neck:  The cystic lesions seen in the left cerebellum is included on the PET images.  There is no hypermetabolism seen in this lesion which is essentially photopenic.  No evidence for hypermetabolic lymphadenopathy in the neck.  Chest:  No hypermetabolic mediastinal or hilar nodes.  The pulmonary nodules in question of both tiny, the larger of the two is seen in the posteromedial right lower lobe measuring 8 mm. There is no evidence for hypermetabolic uptake in either nodule although both are well below the accepted size threshold for reliable resolution on PET imaging.  Abdomen/Pelvis:  No abnormal hypermetabolic activity within the liver, pancreas, adrenal glands, or spleen.  No hypermetabolic lymph nodes in the abdomen or pelvis.  Skeleton:  No focal hypermetabolic activity to suggest skeletal metastasis.  Extremities:  No hypermetabolic activity to suggest metastasis.  IMPRESSION: No evidence for hypermetabolic disease in the neck, chest, abdomen, or pelvis.  The left cerebellar cystic lesion has been included on the PET images and is photopenic with no evidence for associated hypermetabolism.   Original Report Authenticated By: Kennith Center, M.D.   Dg Chest Port 1 View  09/30/2012   *RADIOLOGY REPORT*  Clinical Data: Central line placement.  PORTABLE CHEST - 1 VIEW  Comparison: Two-view chest 09/07/2012.  Findings: A new right IJ line terminates just above the cavoatrial junction.  There is no pneumothorax.  Low lung volumes exaggerate the heart size.  There is no edema or  effusion to suggest failure. Pacing wires are stable.  Linear airspace disease is noted in the medial left lower lobe.  IMPRESSION:  1.  Satisfactory positioning of a new right IJ line without evidence for pneumothorax. 2.  Cardiac enlargement is exaggerated by low lung volumes without evidence for failure. 3.  Medial left lower lobe airspace disease  likely reflects atelectasis.  Early infection is considered less likely.   Original Report Authenticated By: Marin Roberts, M.D.       IMPRESSION: The patient has a new diagnosis of brain metastasis secondary to metastatic melanoma. She had 2 lesions seen with a larger left cerebellar lesion having undergone resection.  The patient is an appropriate candidate I believe for postoperative radiotherapy. I would plan to treat the postoperative cavity concurrently with a definitive treatment to the smaller second lesion. The patient and I discussed the rationale of this treatment. We also discussed the potential side effects and risks of treatment as well. All of her questions were answered and she is interested in proceeding with this treatment plan.   PLAN: The patient will proceed with planning for radiosurgery. This will include a CT simulation in our department as well as a SRS protocol CT scan of the head given that she cannot undergo an MRI scan.    I spent 60 minutes  face to face with the patient and more than 50% of that time was spent in counseling and/or coordination of care.    ________________________________   Radene Gunning, MD, PhD

## 2012-10-17 ENCOUNTER — Encounter: Payer: Self-pay | Admitting: Radiation Oncology

## 2012-10-21 ENCOUNTER — Telehealth: Payer: Self-pay | Admitting: Oncology

## 2012-10-21 NOTE — Telephone Encounter (Signed)
New problem   Pt want nurse to reminder Dr Graciela Husbands that he is suppose to be there for her MRI for tomorrow 10/22/12 at 6pm at [facility] in radiology. She doesn't want him to forget.

## 2012-10-21 NOTE — Telephone Encounter (Signed)
lmonvm for pt re appt for 9/8. Schedule mailed.

## 2012-10-22 ENCOUNTER — Ambulatory Visit (HOSPITAL_COMMUNITY)
Admission: RE | Admit: 2012-10-22 | Discharge: 2012-10-22 | Disposition: A | Discharge status: Home / Self Care | Payer: BC Managed Care – PPO | Source: Ambulatory Visit | Attending: Radiation Oncology | Admitting: Radiation Oncology

## 2012-10-22 ENCOUNTER — Encounter: Payer: Self-pay | Admitting: Radiation Oncology

## 2012-10-22 VITALS — BP 156/94 | HR 78

## 2012-10-22 DIAGNOSIS — G936 Cerebral edema: Secondary | ICD-10-CM | POA: Insufficient documentation

## 2012-10-22 DIAGNOSIS — C7931 Secondary malignant neoplasm of brain: Secondary | ICD-10-CM | POA: Insufficient documentation

## 2012-10-22 DIAGNOSIS — Z950 Presence of cardiac pacemaker: Secondary | ICD-10-CM | POA: Insufficient documentation

## 2012-10-22 DIAGNOSIS — G909 Disorder of the autonomic nervous system, unspecified: Secondary | ICD-10-CM

## 2012-10-22 DIAGNOSIS — C801 Malignant (primary) neoplasm, unspecified: Secondary | ICD-10-CM | POA: Insufficient documentation

## 2012-10-22 DIAGNOSIS — G96198 Other disorders of meninges, not elsewhere classified: Secondary | ICD-10-CM | POA: Insufficient documentation

## 2012-10-22 MED ORDER — GADOBENATE DIMEGLUMINE 529 MG/ML IV SOLN
15.0000 mL | Freq: Once | INTRAVENOUS | Status: AC
  Administered 2012-10-22: 15 mL via INTRAVENOUS

## 2012-10-22 NOTE — Progress Notes (Addendum)
Dr. Clide Cliff explained the risks and benefits of the MRI scan with a pacemaker to the patient.  Consent obtained for MRI. Patient is continuously monitored. Vital signs are documented and heart rhythm is NSR.

## 2012-10-22 NOTE — Progress Notes (Signed)
MRI scan is completed.  Patient remained asymptomatic.

## 2012-10-22 NOTE — Telephone Encounter (Signed)
Dr klein made aware. 

## 2012-10-22 NOTE — Progress Notes (Signed)
Pt with metastatic melanoma to her brain  Previously implanted pacemaker with generator >2000  Two leads in place and non abandoned.  Device interrogated and reprogrammed to DDI  A threshold  0.7@0 .4 and V threshold 2.3@0 .4 Risks reviewed including device failure, inappropriate pacing, power off reset She will be monitored via pulse ox,  She is not device dependent

## 2012-10-23 ENCOUNTER — Ambulatory Visit
Admission: RE | Admit: 2012-10-23 | Discharge: 2012-10-23 | Disposition: A | Discharge status: Home / Self Care | Payer: BC Managed Care – PPO | Source: Ambulatory Visit | Attending: Radiation Oncology | Admitting: Radiation Oncology

## 2012-10-23 VITALS — BP 125/82 | HR 97 | Temp 98.2°F

## 2012-10-23 DIAGNOSIS — C439 Malignant melanoma of skin, unspecified: Secondary | ICD-10-CM | POA: Insufficient documentation

## 2012-10-23 DIAGNOSIS — Z510 Encounter for antineoplastic radiation therapy: Secondary | ICD-10-CM | POA: Insufficient documentation

## 2012-10-23 DIAGNOSIS — C7931 Secondary malignant neoplasm of brain: Secondary | ICD-10-CM

## 2012-10-23 MED ORDER — SODIUM CHLORIDE 0.9 % IJ SOLN
10.0000 mL | INTRAMUSCULAR | Status: DC | PRN
  Administered 2012-10-23: 10 mL via INTRAVENOUS

## 2012-10-23 MED ORDER — DEXAMETHASONE 4 MG PO TABS: ORAL_TABLET | ORAL | Status: DC

## 2012-10-23 MED ORDER — CLONAZEPAM 0.5 MG PO TABS: 0.5000 mg | ORAL_TABLET | Freq: Three times a day (TID) | ORAL | Status: DC | PRN

## 2012-10-23 NOTE — Progress Notes (Signed)
Patient brought to nursing dressing room 10, to remove IV catheter,right forearm, removed and placed 2x2 x 2 gause over site and secured with paper tape,patient given instructions to leave on for a few hours and when removing,if bleeding recurs,to replace dressing back and leave a few mmore hours, may need a bandaid after dressing removed, patient tolerated well, gave verbal understanding 10:39 AM

## 2012-10-23 NOTE — Progress Notes (Signed)
IV stick attempted once.  #22 gauge IV inserted by Zella Ball, RN in right forearm.  Patient tolerated well.

## 2012-10-24 ENCOUNTER — Ambulatory Visit
Admission: RE | Admit: 2012-10-24 | Discharge: 2012-10-24 | Disposition: A | Discharge status: Home / Self Care | Payer: BC Managed Care – PPO | Source: Ambulatory Visit | Attending: Radiation Oncology | Admitting: Radiation Oncology

## 2012-10-24 VITALS — BP 116/80 | HR 66 | Temp 98.9°F

## 2012-10-24 DIAGNOSIS — C7931 Secondary malignant neoplasm of brain: Secondary | ICD-10-CM

## 2012-10-24 DIAGNOSIS — Z923 Personal history of irradiation: Secondary | ICD-10-CM

## 2012-10-24 HISTORY — DX: Personal history of irradiation: Z92.3

## 2012-10-24 MED ORDER — HYDROCODONE-ACETAMINOPHEN 5-325 MG PO TABS: 1.0000 | ORAL_TABLET | Freq: Four times a day (QID) | ORAL | Status: DC | PRN

## 2012-10-24 NOTE — Progress Notes (Signed)
[name] here with her husband for post [facility] monitoring.  She is resting in the recliner.  She does have a headache in the back of her head along the incision line from laying flat on the table.  She is alert and oriented to the person, place and time.  She denies dizziness, vision changes and nausea.  Her eyes are dry due to wearing contacts with the mask.  She has been given diet ginger ale and graham crackers.  Call light in reach.  She is currently taking 4 mg of decadron bid.  She will taper down to 2 mg bid on Tuesday.

## 2012-10-24 NOTE — Progress Notes (Signed)
  Radiation Oncology         [phone] ________________________________  Name: [name] MRN: [id number]  Date: 10/24/2012  DOB: Jan 14, 1956  End of Treatment Note  Diagnosis:   Metastatic melanoma with brain metastasis     Indication for treatment:  Palliative       Radiation treatment dates:   10/24/2012  Site/dose:    1. Left cerebellar target was treated using 6 Arcs to a prescription dose of 15 Gy. ExacTrac Snap verification was performed for each couch angle.  2. Left parietal target was treated using 3 Arcs to a prescription dose of 20 Gy. ExacTrac Snap verification was performed for each couch angle.  Narrative: The patient tolerated radiation treatment relatively well.   The patient exhibited no acute toxicity.  Plan: The patient has completed radiation treatment. The patient will return to radiation oncology clinic for routine followup in one month. I advised the patient to call or return sooner if they have any questions or concerns related to their recovery or treatment. ________________________________  Radene Gunning, M.D., Ph.D.

## 2012-10-24 NOTE — Progress Notes (Signed)
  Radiation Oncology         [phone] ________________________________  Name: WRENLEY SAYED MRN: [id number]  Date: 10/24/2012  DOB: 1955/04/01   SPECIAL TREATMENT PROCEDURE   3D TREATMENT PLANNING AND DOSIMETRY: The patient's radiation plan was reviewed and approved by Dr. Danielle Dess from neurosurgery and radiation oncology prior to treatment. It showed 3-dimensional radiation distributions overlaid onto the planning CT/MRI image set. The [facility] for the target structures as well as the organs at risk were reviewed. The documentation of the 3D plan and dosimetry are filed in the radiation oncology EMR.   NARRATIVE: The patient was brought to the TrueBeam stereotactic radiation treatment machine and placed supine on the CT couch. The head frame was applied, and the patient was set up for stereotactic radiosurgery. Neurosurgery was present for the set-up and delivery   SIMULATION VERIFICATION: In the couch zero-angle position, the patient underwent Exactrac imaging using the Brainlab system with orthogonal KV images. These were carefully aligned and repeated to confirm treatment position for each of the isocenters. The Exactrac snap film verification was repeated at each couch angle.   SPECIAL TREATMENT PROCEDURE: The patient received stereotactic radiosurgery to the following targets:  1. Left cerebellar target was treated using 6 Arcs to a prescription dose of 15 Gy. ExacTrac Snap verification was performed for each couch angle.  2. Left parietal target was treated using 3 Arcs to a prescription dose of 20 Gy. ExacTrac Snap verification was performed for each couch angle.  STEREOTACTIC TREATMENT MANAGEMENT: Following delivery, the patient was transported to nursing in stable condition and monitored for possible acute effects. Vital signs were recorded . The patient tolerated treatment without significant acute effects, and was discharged to home in stable condition.  PLAN: Follow-up in one  month.   ------------------------------------------------  Radene Gunning, MD, PhD

## 2012-10-24 NOTE — Progress Notes (Signed)
[name] resting in the recliner for post srs monitoring.  [name] denies pain except for soreness in her incision on the back of her head.  [name] denies headache, nausea, dizziness and blurred vision.  [name] is alert and oriented to person, place and time.

## 2012-10-24 NOTE — Progress Notes (Signed)
  Radiation Oncology         [phone] ________________________________  Name: DAYLYNN STUMPP MRN: [id number]  Date: 10/23/2012  DOB: [birth year]  SIMULATION AND TREATMENT PLANNING NOTE  DIAGNOSIS:  Metastatic melanoma  NARRATIVE:  The patient was brought to the CT Simulation planning suite.  Identity was confirmed.  All relevant records and images related to the planned course of therapy were reviewed.  The patient freely provided informed written consent to proceed with treatment after reviewing the details related to the planned course of therapy. The consent form was witnessed and verified by the simulation staff. Intravenous access was established for contrast administration. Then, the patient was set-up in a stable reproducible supine position for radiation therapy.  A relocatable thermoplastic stereotactic head frame was fabricated for precise immobilization.  CT images were obtained.  Surface markings were placed.  The CT images were loaded into the planning software and fused with the patient's targeting MRI scan.  Then the target and avoidance structures were contoured.  Treatment planning then occurred.  The radiation prescription was entered and confirmed.  I have requested 3D planning  I have requested a DVH of the following structures: Brain stem, brain, left eye, right I, lenses, optic chiasm, target volumes, uninvolved brain, and normal tissue.    PLAN:  The patient will receive 20 Gy in 1 fraction to the untreated lesion, and she will concurrently he received 15 gray to the post operative region in 1 fraction..  ________________________________  Radene Gunning, MD, PhD

## 2012-10-29 NOTE — Addendum Note (Signed)
Encounter addended by: Barnett Abu, MD on: 10/29/2012 12:58 PM<BR>     Documentation filed: Clinical Notes

## 2012-10-29 NOTE — Op Note (Signed)
  Name: [name]  MRN: [id number]  Date: 10/24/2012   DOB: [birth year]  Stereotactic Radiosurgery Operative Note  PRE-OPERATIVE DIAGNOSIS:  Multiple Brain Metastases  POST-OPERATIVE DIAGNOSIS:  Multiple Brain Metastases  PROCEDURE:  Stereotactic Radiosurgery  SURGEON:  Stefani Dama, MD  NARRATIVE: The patient underwent a radiation treatment planning session in the radiation oncology simulation suite under the care of the radiation oncology physician and physicist.  I participated closely in the radiation treatment planning afterwards. The patient underwent planning CT which was fused to 3T high resolution MRI with 1 mm axial slices.  These images were fused on the planning system.  We contoured the gross target volumes and subsequently expanded this to yield the Planning Target Volume. I actively participated in the planning process.  I helped to define and review the target contours and also the contours of the optic pathway, eyes, brainstem and selected nearby organs at risk.  All the dose constraints for critical structures were reviewed and compared to AAPM Task Group 101.  The prescription dose conformity was reviewed.  I approved the plan electronically.    Accordingly, [name] was brought to the TrueBeam stereotactic radiation treatment linac and placed in the custom immobilization mask.  The patient was aligned according to the IR fiducial markers with BrainLab Exactrac, then orthogonal x-rays were used in ExacTrac with the 6DOF robotic table and the shifts were made to align the patient  [name] received stereotactic radiosurgery uneventfully.    Lesions treated:  2   Complex lesions treated:  1 (>3.5 cm, <29mm of optic path, or within the brainstem)   The detailed description of the procedure is recorded in the radiation oncology procedure note.  I was present for the duration of the procedure.  DISPOSITION:  Following delivery, the patient was transported to  nursing in stable condition and monitored for possible acute effects to be discharged to home in stable condition with follow-up in one month.  Stefani Dama, MD 10/29/2012 12:57 PM

## 2012-10-31 ENCOUNTER — Telehealth: Payer: Self-pay | Admitting: Oncology

## 2012-10-31 NOTE — Telephone Encounter (Signed)
Called and spoke to Navarino at [facility]'s device clinic.  He will arrange the post radiation pacemaker check for [facility].

## 2012-11-03 ENCOUNTER — Ambulatory Visit (HOSPITAL_BASED_OUTPATIENT_CLINIC_OR_DEPARTMENT_OTHER): Payer: BC Managed Care – PPO | Admitting: Oncology

## 2012-11-03 ENCOUNTER — Telehealth: Payer: Self-pay | Admitting: Oncology

## 2012-11-03 VITALS — BP 116/79 | HR 63 | Temp 97.0°F | Resp 18 | Ht 63.0 in | Wt 160.7 lb

## 2012-11-03 DIAGNOSIS — C7931 Secondary malignant neoplasm of brain: Secondary | ICD-10-CM

## 2012-11-03 DIAGNOSIS — C439 Malignant melanoma of skin, unspecified: Secondary | ICD-10-CM

## 2012-11-03 NOTE — Progress Notes (Signed)
[facility] New Patient Consult   Referring MD: Munira Polson 57 y.o.  05-27-1955    Reason for Referral: Metastatic melanoma     HPI: Ms. Blackwelder was diagnosed with a stage II (T2b N0) melanoma of the right upper extremity in December of 2009. She was evaluated by myself and Dr. Dan Europe. She did not receive adjuvant therapy.  She reports developing headaches in mid July. A CT of the brain on 09/11/2012 revealed a 3 x 2.6 cm irregular ring-enhancing mass in the left cerebellum with local mass effect. A second enhancing lesion was noted at the junction of the medial left frontal and parietal lobes measuring 0.8 x 0.7 cm. Minimal enlargement of a superior left calvarial lucency was noted compared to a scan from 2009.  A CT the chest revealed a 3 mm left upper lobe nodule and an 8 mm right lower lobe nodule. No adenopathy. No evidence of metastatic disease in the abdomen or pelvis. A PET scan on 09/14/2012 revealed no evidence of metastatic disease. The cerebellar lesion and lung nodules were not hypermetabolic.  She was referred to Dr. Danielle Dess and was taken the operating room on 09/30/2012 for resection of the left cerebellar mass. The pathology ([id number]) revealed metastatic malignant melanoma. A BRAF mutation [id number] was detected.   She was referred to Dr. Mitzi Hansen and underwent SR S. treatment to the left cerebellum and left parietal lesions on Jul 24 2012. MRI of the brain on 10/23/2012 identified the left parietal metastasis measuring 17 x 15 x 18 mm with mild surrounding edema. No other intracranial metastases. Status post left cerebellar metastasis resection. No residual mass effect.  The headache has resolved. She reports no neurologic symptoms at present.   Past Medical History  Diagnosis Date  . 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 Hypoglycemia, unspecified   . Other nonspecific abnormal serum enzyme levels     . Cervicalgia   . Disturbance of skin sensation   . Other specified congenital anomalies of nervous system   . Swelling of limb   . Disorder of bone and cartilage, unspecified   . Atrial tachycardia   . CVA (cerebral infarction)     2012 with dizziness and vision change felt embolic from atrial tachy  . History of pacemaker   . POTS (postural orthostatic tachycardia syndrome)   . Diverticulosis   . Skin cancer-nonmelanoma   . Osteopenia   . DVT (deep venous thrombosis)   . Atrial fibrillation   . DM (diabetes mellitus)   . DVT (deep venous thrombosis)   . HLD (hyperlipidemia)   . HTN (hypertension)   . Pacemaker     autonomic dysfunction  . Complication of anesthesia     pt states wakes up with "shakes"  . PONV (postoperative nausea and vomiting)   . Stroke     left weaker    .     Melanoma-stage II A (T2b N0)                                                                    December 2009  .     G0 P0 Past Surgical History  Procedure Laterality Date  .  Insert / replace / remove pacemaker  2012    1999, x 3  . Ablation saphenous vein w/ rfa      2002  . Tonsillectomy and adenoidectomy    . Carpal tunnel release      left  . Nose surgery  2010, 2012    for nose bleeds x 2  . Fatty tumor removed      from chest  . Melanoma excision      with removal of lymph nodes, left shoulder  . Deep axillary sentinel node biopsy / excision   2009     due to extensive Melanoma-right arm  . Laparoscopy-left ovary and fallopian tube removed   09/06/2011    Procedure: LAPAROSCOPY OPERATIVE;  Surgeon: Tresa Endo A. Ernestina Penna, MD;  Location: [facility];  Service: Gynecology;  Laterality: Left;  with Left Ovarian Cystectomy   . Craniotomy N/A 09/30/2012    suboccipital craniectomy-resection of left cerebellar mass     Family History  Problem Relation Age of Onset  . Heart disease Father   . Diabetes Maternal Grandmother   . Diabetes Paternal Grandmother   . Stroke Mother   . Colon polyps  Mother   . Clotting disorder Maternal Grandmother     stroke  . Crohn's disease Maternal Grandmother   . Diabetes Father   . Kidney disease Father     Current outpatient prescriptions:acetaminophen (TYLENOL) 500 MG tablet, Take 1,000 mg by mouth every 6 (six) hours as needed for pain., Disp: , Rfl: ;  Cholecalciferol 50000 UNITS TABS, Take 50,000 Units by mouth every 7 (seven) days. On Saturdays (from [facility]), Disp: , Rfl: ;  clonazePAM (KLONOPIN) 0.5 MG tablet, Take 1 tablet (0.5 mg total) by mouth 3 (three) times daily as needed for anxiety., Disp: 50 tablet, Rfl: 0 cloNIDine (CATAPRES) 0.1 MG tablet, Take 0.1 mg by mouth at bedtime. , Disp: , Rfl: ;  dexamethasone (DECADRON) 4 MG tablet, Take with meals: 1 tab Q 12 hr x 5 days, then 1/2 tab po QD x 5 days, then 1/2 tab QOD x 5 days., Disp: 30 tablet, Rfl: 0;  fluorouracil (EFUDEX) 5 % cream, , Disp: , Rfl: ;  HYDROcodone-acetaminophen (NORCO/VICODIN) 5-325 MG per tablet, Take 1-2 tablets by mouth every 6 (six) hours as needed., Disp: 30 tablet, Rfl: 0 hydrocortisone (ANUSOL-HC) 25 MG suppository, Place 25 mg rectally at bedtime as needed for hemorrhoids., Disp: , Rfl: ;  hydrocortisone-pramoxine (ANALPRAM-HC) 2.5-1 % rectal cream, Place 1 application rectally at bedtime as needed for hemorrhoids., Disp: , Rfl: ;  mesalamine (CANASA) 1000 MG suppository, Place 1,000 mg rectally at bedtime as needed (hemorrhoids). , Disp: , Rfl:  metoprolol (LOPRESSOR) 50 MG tablet, Take 50 mg by mouth See admin instructions. Take 1 tablet (50 mg) every morning and a 2nd tablet in the  evening as needed for diastolic blood pressure >100, Disp: , Rfl: ;  ondansetron (ZOFRAN-ODT) 4 MG disintegrating tablet, Take 4 mg by mouth daily as needed for nausea., Disp: , Rfl: ;  pantoprazole (PROTONIX) 40 MG tablet, Take 40 mg by mouth daily as needed (acid reflux). , Disp: , Rfl:  PHENobarbital (LUMINAL) 64.8 MG tablet, Take 1 tablet (64.8 mg total) by mouth at  bedtime., Disp: 90 tablet, Rfl: 3;  polyethylene glycol (MIRALAX / GLYCOLAX) packet, Take 17 g by mouth at bedtime. Mix with 8 oz of liquid and drink, Disp: , Rfl: ;  potassium chloride SA (K-DUR,KLOR-CON) 20 MEQ tablet, Take 20 mEq by mouth  daily. , Disp: , Rfl: ;  rosuvastatin (CRESTOR) 5 MG tablet, Take 1 tablet (5 mg total) by mouth daily., Disp: 90 tablet, Rfl: 3 cyanocobalamin (,VITAMIN B-12,) 1000 MCG/ML injection, Inject 1,000 mcg into the muscle every 30 (thirty) days. Last injection 09/21/12, Disp: , Rfl:   Allergies:  Allergies  Allergen Reactions  . Doxycycline Hives  . Erythromycin Hives  . Iohexol      Desc: pt. states allergic to dye,-did fine with pre-meds. 02/13/05 06/14/10...pt did not state allergy to contrast, no premeds given, pt tolerated contrast.   . Penicillins Hives  . Preparation H [Pramox-Pe-Glycerin-Petrolatum] Hives    Cannot use  Cream or Suppositories   . Sulfonamide Derivatives Hives    Social History: She works in the [employer]. She does not use cigarettes. She drinks wine with dinner.  ROS:   Positives include: "Shaky "while on the Decadron taper, hot flashes, nausea following surgery-no emesis, urinary urgency following the SRS treatment  A complete ROS was otherwise negative.  Physical Exam:  Blood pressure 116/79, pulse 63, temperature 97 F (36.1 C), temperature source Oral, resp. rate 18, height 5\' 3"  (1.6 m), weight 160 lb 11.2 oz (72.893 kg).  HEENT: Oropharynx without visible mass, neck without mass Lungs: Clear bilaterally Cardiac: Regular rate and rhythm Abdomen: No hepatomegaly, nontender, no mass  Vascular: No leg edema Lymph nodes: No cervical, supra-clavicular, axillary, or inguinal nodes Neurologic: Alert and oriented, the motor exam appears intact in the upper and lower extremities. Finger to nose testing is normal. Skin: Healed left occipital incision, right arm scar without evidence of recurrent tumor, right  calf scar without evidence of recurrent tumor   LAB:  CBC  Lab Results  Component Value Date   WBC 13.1* 10/01/2012   HGB 12.3 10/01/2012   HCT 36.8 10/01/2012   MCV 89.5 10/01/2012   PLT 214 10/01/2012     CMP      Component Value Date/Time   NA 139 10/01/2012 0500   K 4.0 10/01/2012 0500   CL 106 10/01/2012 0500   CO2 24 10/01/2012 0500   GLUCOSE 125* 10/01/2012 0500   GLUCOSE 84 01/03/2006 1118   BUN 10 10/01/2012 0500   CREATININE 0.47* 10/01/2012 0500   CALCIUM 9.2 10/01/2012 0500   PROT 6.2 09/06/2011 2225   ALBUMIN 3.5 09/06/2011 2225   AST 18 09/06/2011 2225   ALT 20 09/06/2011 2225   ALKPHOS 69 09/06/2011 2225   BILITOT 0.2* 09/06/2011 2225   GFRNONAA >90 10/01/2012 0500   GFRAA >90 10/01/2012 0500     Radiology: As per history of present illness    Assessment/Plan:   1. Metastatic melanoma -Status post resection of a right arm melanoma, stage II A (T2b N0) in December 2009 -Metastatic melanoma, status post resection of a left cerebellar metastasis 09/30/2012, BRAF mutation identified -Status post SRS treatment of the left cerebellum and a left parietal metastasis 10/24/2012 -Staging CTs and PET scan without evidence of distant metastatic disease aside from indeterminate small lung nodules  2. Arrhythmia, pacemaker in place  3. Hypertension  4. History of a CVA  5. History of deep vein thrombosis   Disposition:   Ms. Mcneece has been diagnosed with metastatic melanoma involving the brain. She has undergone resection of a left cerebellar metastasis and SRS treatment to another brain lesion. There is no clinical or x-ray evidence of additional metastatic disease aside from indeterminate lung nodules seen on the CT 09/11/2012.  I discussed the prognosis and treatment  options with the patient and her family. She is almost 5 years out from the diagnosis of melanoma at the right upper arm. The tumor does not appear to be behaving in a "aggressive "fashion based on the lack of  measurable systemic disease, but the parietal lesion increased in size over the one month prior to [facility] treatment.  She saw Dr. Dan Europe at [facility] last week. She recommends observation with restaging scans 6-8 weeks after the [facility] treatment. She will be a candidate for a combined MEK and BRAF inhibitor trial at [facility] if she has measurable disease. We discussed single and combined agent therapy today. We also discussed ipilumumab.  She will be scheduled for a restaging CT of the brain and chest on 12/01/2012. She will see myself and Dr. Mitzi Hansen the same day. I did not schedule a restaging brain MRI since she has the pacemaker and required cardiology monitoring during the 10/23/2012 MRI.  Suleman Gunning 11/03/2012, 7:00 PM

## 2012-11-03 NOTE — Telephone Encounter (Signed)
Gave pt appt for CT and MD on 12/01/12

## 2012-11-06 ENCOUNTER — Emergency Department (HOSPITAL_COMMUNITY): Payer: BC Managed Care – PPO

## 2012-11-06 ENCOUNTER — Telehealth: Payer: Self-pay | Admitting: *Deleted

## 2012-11-06 ENCOUNTER — Emergency Department (HOSPITAL_COMMUNITY)
Admission: EM | Admit: 2012-11-06 | Discharge: 2012-11-06 | Disposition: A | Discharge status: Home / Self Care | Payer: BC Managed Care – PPO | Attending: Emergency Medicine | Admitting: Emergency Medicine

## 2012-11-06 ENCOUNTER — Encounter (HOSPITAL_COMMUNITY): Payer: Self-pay | Admitting: Emergency Medicine

## 2012-11-06 DIAGNOSIS — Z85828 Personal history of other malignant neoplasm of skin: Secondary | ICD-10-CM | POA: Insufficient documentation

## 2012-11-06 DIAGNOSIS — Z79899 Other long term (current) drug therapy: Secondary | ICD-10-CM | POA: Insufficient documentation

## 2012-11-06 DIAGNOSIS — M545 Low back pain, unspecified: Secondary | ICD-10-CM | POA: Insufficient documentation

## 2012-11-06 DIAGNOSIS — E119 Type 2 diabetes mellitus without complications: Secondary | ICD-10-CM | POA: Insufficient documentation

## 2012-11-06 DIAGNOSIS — Z8719 Personal history of other diseases of the digestive system: Secondary | ICD-10-CM | POA: Insufficient documentation

## 2012-11-06 DIAGNOSIS — Z87828 Personal history of other (healed) physical injury and trauma: Secondary | ICD-10-CM | POA: Insufficient documentation

## 2012-11-06 DIAGNOSIS — R509 Fever, unspecified: Secondary | ICD-10-CM | POA: Insufficient documentation

## 2012-11-06 DIAGNOSIS — E785 Hyperlipidemia, unspecified: Secondary | ICD-10-CM | POA: Insufficient documentation

## 2012-11-06 DIAGNOSIS — I10 Essential (primary) hypertension: Secondary | ICD-10-CM | POA: Insufficient documentation

## 2012-11-06 DIAGNOSIS — Z87728 Personal history of other specified (corrected) congenital malformations of nervous system and sense organs: Secondary | ICD-10-CM | POA: Insufficient documentation

## 2012-11-06 DIAGNOSIS — Z950 Presence of cardiac pacemaker: Secondary | ICD-10-CM | POA: Insufficient documentation

## 2012-11-06 DIAGNOSIS — Z86718 Personal history of other venous thrombosis and embolism: Secondary | ICD-10-CM | POA: Insufficient documentation

## 2012-11-06 DIAGNOSIS — R519 Headache, unspecified: Secondary | ICD-10-CM

## 2012-11-06 DIAGNOSIS — IMO0002 Reserved for concepts with insufficient information to code with codable children: Secondary | ICD-10-CM | POA: Insufficient documentation

## 2012-11-06 DIAGNOSIS — Z8673 Personal history of transient ischemic attack (TIA), and cerebral infarction without residual deficits: Secondary | ICD-10-CM | POA: Insufficient documentation

## 2012-11-06 DIAGNOSIS — Z880 Allergy status to penicillin: Secondary | ICD-10-CM | POA: Insufficient documentation

## 2012-11-06 DIAGNOSIS — R51 Headache: Secondary | ICD-10-CM | POA: Insufficient documentation

## 2012-11-06 DIAGNOSIS — M542 Cervicalgia: Secondary | ICD-10-CM | POA: Insufficient documentation

## 2012-11-06 DIAGNOSIS — Z923 Personal history of irradiation: Secondary | ICD-10-CM | POA: Insufficient documentation

## 2012-11-06 DIAGNOSIS — Z8739 Personal history of other diseases of the musculoskeletal system and connective tissue: Secondary | ICD-10-CM | POA: Insufficient documentation

## 2012-11-06 LAB — URINALYSIS, ROUTINE W REFLEX MICROSCOPIC
Bilirubin Urine: NEGATIVE
Glucose, UA: NEGATIVE mg/dL
Hgb urine dipstick: NEGATIVE
Ketones, ur: NEGATIVE mg/dL
Leukocytes, UA: NEGATIVE
Nitrite: NEGATIVE
Protein, ur: NEGATIVE mg/dL
Specific Gravity, Urine: 1.009 (ref 1.005–1.030)
Urobilinogen, UA: 0.2 mg/dL (ref 0.0–1.0)
pH: 7 (ref 5.0–8.0)

## 2012-11-06 LAB — COMPREHENSIVE METABOLIC PANEL
ALT: 96 U/L — ABNORMAL HIGH (ref 0–35)
AST: 79 U/L — ABNORMAL HIGH (ref 0–37)
Albumin: 3.9 g/dL (ref 3.5–5.2)
Alkaline Phosphatase: 84 U/L (ref 39–117)
BUN: 16 mg/dL (ref 6–23)
CO2: 28 mEq/L (ref 19–32)
Calcium: 9.7 mg/dL (ref 8.4–10.5)
Chloride: 102 mEq/L (ref 96–112)
Creatinine, Ser: 0.7 mg/dL (ref 0.50–1.10)
GFR calc Af Amer: >90 mL/min (ref >90)
GFR calc non Af Amer: >90 mL/min (ref >90)
Glucose, Bld: 124 mg/dL — ABNORMAL HIGH (ref 70–99)
Potassium: 3.8 mEq/L (ref 3.5–5.1)
Sodium: 139 mEq/L (ref 135–145)
Total Bilirubin: 0.4 mg/dL (ref 0.3–1.2)
Total Protein: 7.2 g/dL (ref 6.0–8.3)

## 2012-11-06 LAB — CBC WITH DIFFERENTIAL/PLATELET
Basophils Absolute: 0 10*3/uL (ref 0.0–0.1)
Basophils Relative: 0 % (ref 0–1)
Eosinophils Absolute: 0 10*3/uL (ref 0.0–0.7)
Eosinophils Relative: 0 % (ref 0–5)
HCT: 40.6 % (ref 36.0–46.0)
Hemoglobin: 14 g/dL (ref 12.0–15.0)
Lymphocytes Relative: 3 % — ABNORMAL LOW (ref 12–46)
Lymphs Abs: 0.5 10*3/uL — ABNORMAL LOW (ref 0.7–4.0)
MCH: 31 pg (ref 26.0–34.0)
MCHC: 34.5 g/dL (ref 30.0–36.0)
MCV: 90 fL (ref 78.0–100.0)
Monocytes Absolute: 0.8 10*3/uL (ref 0.1–1.0)
Monocytes Relative: 5 % (ref 3–12)
Neutro Abs: 13.8 10*3/uL — ABNORMAL HIGH (ref 1.7–7.7)
Neutrophils Relative %: 91 % — ABNORMAL HIGH (ref 43–77)
Platelets: 210 10*3/uL (ref 150–400)
RBC: 4.51 MIL/uL (ref 3.87–5.11)
RDW: 13.6 % (ref 11.5–15.5)
WBC: 15.1 10*3/uL — ABNORMAL HIGH (ref 4.0–10.5)

## 2012-11-06 LAB — LIPASE, BLOOD: Lipase: 51 U/L (ref 11–59)

## 2012-11-06 MED ORDER — METOCLOPRAMIDE HCL 5 MG/ML IJ SOLN
10.0000 mg | Freq: Once | INTRAMUSCULAR | Status: AC
  Administered 2012-11-06: 10 mg via INTRAVENOUS
  Filled 2012-11-06: qty 2

## 2012-11-06 MED ORDER — ACETAMINOPHEN 325 MG PO TABS
650.0000 mg | ORAL_TABLET | Freq: Once | ORAL | Status: DC
  Filled 2012-11-06: qty 2

## 2012-11-06 MED ORDER — SODIUM CHLORIDE 0.9 % IV BOLUS (SEPSIS)
1000.0000 mL | Freq: Once | INTRAVENOUS | Status: AC
  Administered 2012-11-06: 1000 mL via INTRAVENOUS

## 2012-11-06 MED ORDER — DIPHENHYDRAMINE HCL 50 MG/ML IJ SOLN
25.0000 mg | Freq: Once | INTRAMUSCULAR | Status: AC
  Administered 2012-11-06: 25 mg via INTRAVENOUS
  Filled 2012-11-06: qty 1

## 2012-11-06 NOTE — ED Provider Notes (Signed)
Medical screening examination/treatment/procedure(s) were conducted as a shared visit with non-physician practitioner(s) and myself.  I personally evaluated the patient during the encounter  [name] is a 57 y.o. female with Pmhx as above who presents with posterior h/a, episode of emesis early this morning, as well as fever this morning.  On PE, [name] well-appearing, non-toxic, in NAD.  Reports posterior neck pain, but has no stiffness/decreased ROM.  No focal neuro findings, no rash, and no fever in ED.  CT head unchanged.  I doubt meningitis,but [name] given strong return precaution for return of fever, h/a, neck stiffness, confusion, rash.   1. Headache   2. Neck pain       Shanna Cisco, MD 11/06/12 2020

## 2012-11-06 NOTE — ED Provider Notes (Signed)
CSN: [id number]     Arrival date & time 11/06/12  4098 History   First MD Initiated Contact with Patient 11/06/12 [phone]     Chief Complaint  Patient presents with  . Emesis   (Consider location/radiation/quality/duration/timing/severity/associated sxs/prior Treatment) Patient is a 57 y.o. female presenting with vomiting. The history is provided by the patient and medical records.  Emesis Associated symptoms: headaches    Patient with extensive past medical history presents to the ED for headache, neck pain, fever, chills, and an episode of emesis earlier this morning.  Patient was found to have metastatic myeloma in July, she is status post tumor resection, currently undergoing radiation therapy. 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 Patient states last night after returning home she began having neck and low back pain. No injury, trauma, or falls.  She thought this was due to sitting upright in a chair which she has not done in a while, however became concerned when symptoms were not improving this morning.  Pt continues to have headache and some neck pain on arrival.  No further vomiting.  Headache associated with some photophobia and mildly blurred vision.  No aura, tinnitus, confusion, numbness, weakness, or changes in speech.  Denies any chest pain, SOB, or abdominal pain.  No loss of bowel or bladder function. VS stable on arrival.  Past Medical History  Diagnosis Date  . 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 Hypoglycemia, unspecified   . Other nonspecific abnormal serum enzyme levels   . Cervicalgia   . Disturbance of skin sensation   . Other specified congenital anomalies of nervous system   . Swelling of limb   . Disorder of bone and cartilage, unspecified   . Atrial tachycardia   . CVA (cerebral infarction)     2012 with dizziness and vision change felt embolic from atrial tachy  . History of pacemaker    . POTS (postural orthostatic tachycardia syndrome)   . Diverticulosis   . Skin cancer   . Osteopenia   . DVT (deep venous thrombosis)   . Atrial fibrillation   . DM (diabetes mellitus)   . DVT (deep venous thrombosis)   . HLD (hyperlipidemia)   . HTN (hypertension)   . Pacemaker     autonomic dysfunction  . Complication of anesthesia     pt states wakes up with "shakes"  . PONV (postoperative nausea and vomiting)   . Stroke     left weaker    Past Surgical History  Procedure Laterality Date  . Insert / replace / remove pacemaker  2012    1999, x 3  . Ablation saphenous vein w/ rfa      2002  . Tonsillectomy and adenoidectomy    . Carpal tunnel release      left  . Nose surgery  2010, 2012    for nose bleeds x 2  . Fatty tumor removed      from chest  . Melanoma excision      with removal of lymph nodes, left shoulder  . Deep axillary sentinel node biopsy / excision      due to extensive Melanoma-right arm  . Laparoscopy  09/06/2011    Procedure: LAPAROSCOPY OPERATIVE;  Surgeon: Tresa Endo A. Ernestina Penna, MD;  Location: [facility];  Service: Gynecology;  Laterality: Left;  with Left Ovarian Cystectomy   . Craniotomy N/A 09/30/2012    suboccipital craniectomy  Family History  Problem Relation Age of Onset  . Heart disease Father   . Diabetes Maternal Grandmother   . Diabetes Paternal Grandmother   . Stroke Mother   . Colon polyps Mother   . Clotting disorder Maternal Grandmother     stroke  . Crohn's disease Maternal Grandmother   . Diabetes Father   . Kidney disease Father    History  Substance Use Topics  . Smoking status: Never Smoker   . Smokeless tobacco: Never Used  . Alcohol Use: Yes     Comment: glass of wine daily   OB History   Grav Para Term Preterm Abortions TAB SAB Ect Mult Living                 Review of Systems  HENT: Positive for neck pain.   Gastrointestinal: Positive for vomiting.  Neurological: Positive for headaches.  All other systems  reviewed and are negative.    Allergies  Doxycycline; Erythromycin; Penicillins; Preparation h; and Sulfonamide derivatives  Home Medications   Current Outpatient Rx  Name  Route  Sig  Dispense  Refill  . acetaminophen (TYLENOL) 500 MG tablet   Oral   Take 1,000 mg by mouth every 6 (six) hours as needed for pain.         . Cholecalciferol 50000 UNITS TABS   Oral   Take 50,000 Units by mouth every 7 (seven) days. On Saturdays (from [facility])         . clonazePAM (KLONOPIN) 0.5 MG tablet   Oral   Take 1 tablet (0.5 mg total) by mouth 3 (three) times daily as needed for anxiety.   50 tablet   0   . cloNIDine (CATAPRES) 0.1 MG tablet   Oral   Take 0.1 mg by mouth at bedtime.          . cyanocobalamin (,VITAMIN B-12,) 1000 MCG/ML injection   Intramuscular   Inject 1,000 mcg into the muscle every 30 (thirty) days.          Marland Kitchen dexamethasone (DECADRON) 4 MG tablet      Take with meals: 1 tab Q 12 hr x 5 days, then 1/2 tab po QD x 5 days, then 1/2 tab QOD x 5 days.   30 tablet   0   . fluorouracil (EFUDEX) 5 % cream   Topical   Apply 1 application topically daily as needed (hemorrhoids).          Marland Kitchen HYDROcodone-acetaminophen (NORCO/VICODIN) 5-325 MG per tablet   Oral   Take 1-2 tablets by mouth every 6 (six) hours as needed.   30 tablet   0   . hydrocortisone (ANUSOL-HC) 25 MG suppository   Rectal   Place 25 mg rectally at bedtime as needed for hemorrhoids.         . hydrocortisone-pramoxine (ANALPRAM-HC) 2.5-1 % rectal cream   Rectal   Place 1 application rectally at bedtime as needed for hemorrhoids.         . mesalamine (CANASA) 1000 MG suppository   Rectal   Place 1,000 mg rectally at bedtime as needed (hemorrhoids).          . metoprolol tartrate (LOPRESSOR) 25 MG tablet   Oral   Take 25 mg by mouth daily.         . ondansetron (ZOFRAN-ODT) 4 MG disintegrating tablet   Oral   Take 4 mg by mouth daily as needed for nausea.          Marland Kitchen  pantoprazole (PROTONIX) 40 MG tablet   Oral   Take 40 mg by mouth daily as needed (acid reflux).          Marland Kitchen PHENobarbital (LUMINAL) 64.8 MG tablet   Oral   Take 1 tablet (64.8 mg total) by mouth at bedtime.   90 tablet   3   . polyethylene glycol (MIRALAX / GLYCOLAX) packet   Oral   Take 17 g by mouth at bedtime. Mix with 8 oz of liquid and drink         . potassium chloride SA (K-DUR,KLOR-CON) 20 MEQ tablet   Oral   Take 20 mEq by mouth daily.          . rosuvastatin (CRESTOR) 5 MG tablet   Oral   Take 1 tablet (5 mg total) by mouth daily.   90 tablet   3    BP 95/54  Pulse 63  Temp(Src) 99.2 F (37.3 C) (Oral)  Resp 18  SpO2 100%  Physical Exam  Nursing note and vitals reviewed. Constitutional: She is oriented to person, place, and time. She appears well-developed and well-nourished. No distress.  HENT:  Head: Normocephalic and atraumatic.  Eyes: Conjunctivae and EOM are normal.  Neck: Normal range of motion and full passive range of motion without pain. Neck supple. No rigidity.  Healed posterior neck midline incision; Full ROM of neck without pain; no meningeal signs  Cardiovascular: Normal rate, regular rhythm and normal heart sounds.   Pulmonary/Chest: Effort normal and breath sounds normal. No respiratory distress. She has no wheezes.  Abdominal: Soft. Bowel sounds are normal. There is no tenderness. There is no guarding and no CVA tenderness.  Musculoskeletal: Normal range of motion. She exhibits no edema.       Cervical back: She exhibits tenderness and pain. She exhibits normal range of motion, no bony tenderness, no swelling, no edema, no deformity, no laceration, no spasm and normal pulse.       Lumbar back: She exhibits pain. She exhibits normal range of motion, no tenderness, no bony tenderness, no swelling, no edema, no deformity, no laceration, no spasm and normal pulse.       Back:  TTP of cervical paraspinal muscles bilaterally; full  ROM maintained; strong radial pulses; sensation intact Pain of lumbar paraspinal muscles without focal TTP  Neurological: She is alert and oriented to person, place, and time. She has normal strength. She displays no tremor. No cranial nerve deficit or sensory deficit. She displays no seizure activity. Gait normal.  CN grossly intact, moves all extremities appropriately without ataxia, no focal neuro deficits or facial droop appreciated; normal gait  Skin: Skin is warm and dry. She is not diaphoretic.  Psychiatric: She has a normal mood and affect.    ED Course  Procedures (including critical care time)  Labs Review Labs Reviewed  CBC WITH DIFFERENTIAL - Abnormal; Notable for the following:    WBC 15.1 (*)    Neutrophils Relative % 91 (*)    Neutro Abs 13.8 (*)    Lymphocytes Relative 3 (*)    Lymphs Abs 0.5 (*)    All other components within normal limits  COMPREHENSIVE METABOLIC PANEL - Abnormal; Notable for the following:    Glucose, Bld 124 (*)    AST 79 (*)    ALT 96 (*)    All other components within normal limits  LIPASE, BLOOD  URINALYSIS, ROUTINE W REFLEX MICROSCOPIC   Imaging Review Ct Head Wo Contrast  11/06/2012  CLINICAL DATA:  Melanoma. Prior radiation and stereotactic surgery of a left upper lobe brain metastasis. New onset headache, nausea, vomiting.  EXAM: CT HEAD WITHOUT CONTRAST  TECHNIQUE: Contiguous axial images were obtained from the base of the skull through the vertex without intravenous contrast.  COMPARISON:  MRI 10/22/2012  FINDINGS: Postoperative changes from left occipital craniectomy. Area of encephalomalacia in the left cerebellar hemisphere. There is a 2 cm hyperdense area within the left medial parietal lobe in the area of previously seen metastasis. This is similar size when compared to recent MRI. The hyperdense appearance could be related to melanin, hemorrhage or both. Mild surrounding edema, also stable. No hydrocephalus. No mass effect or midline  shift.  IMPRESSION: Stable hyperdense left medial parietal metastasis.  Stable postoperative changes in the left cerebellar region.   Electronically Signed   By: Charlett Nose M.D.   On: 11/06/2012 10:22    MDM   1. Headache   2. Neck pain    Labs as above, mild leukocytosis at 15.1. CT negative for acute findings. UA without infection. Offered further pain meds for headache-- pt stated she only wanted tylenol.   Evaluated by Dr. Micheline Maze-- does not feel this is presentation is indicative of meningitis, LP not recommended at this time.  Pt afebrile, non-toxic appearing, NAD, VS stable- ok for discharge.  Pt will FU with her oncologist, Dr. Truett Perna and/or neurosurgery, Dr. Danielle Dess.  Discussed plan with pt, she agreed.  Strict return precautions advised.  Garlon Hatchet, PA-C 11/06/12 1627  Garlon Hatchet, PA-C 11/06/12 [phone]

## 2012-11-06 NOTE — ED Notes (Signed)
Pt is aware of the need for a urine sample. States she is unable at this time, but knows to ring the call bell when she feels the need.

## 2012-11-06 NOTE — ED Notes (Signed)
Urine cup given to pt.

## 2012-11-06 NOTE — Telephone Encounter (Signed)
Returning call left message from patient on voice mail, patient having fever, throwing up , excruciating head ache back of head, had SRS brain metastases  Treatment   (2 lesions)on 10/24/12, called and patient is in the ED right now, will inform Dr.Moody, Dr. Danielle Dess is her neurosurgeon

## 2012-11-06 NOTE — ED Notes (Signed)
Per pt, did not feel well yesterday-had chills and vomited bile early this am-c/o neck pain

## 2012-11-08 ENCOUNTER — Other Ambulatory Visit: Payer: Self-pay | Admitting: Oncology

## 2012-11-10 ENCOUNTER — Encounter: Payer: Self-pay | Admitting: Radiation Therapy

## 2012-11-10 NOTE — Progress Notes (Signed)
[name] called in to speak with the physician on call because she was experiencing a severe headache and nausea again over the weekend. Dr. Cyndie Chime was the Med Onc on call. He felt that her most recent CT showed swelling and edema and that could be the cause of her symptoms. He gave her a prescription for Zofran to manage her nausea and adjusted her steroid dose. She is now taking 4 mg TID for 72 hours and then 4 mg BID until the RX is complete. (60 pills) I let her know to call us back if she is having any trouble. She does not like taking the steroids because she feels they increase her blood pressure and make it difficult for her to sleep, but understands that if it will help with her swelling and edema, she will do what she needs to do.    [name] R.T.(R)(T) Radiation Special Procedures Navigator Radiation Oncology [facility] [phone]  Office [phone]  Fax [email or web address]

## 2012-11-13 ENCOUNTER — Encounter: Payer: Self-pay | Admitting: Internal Medicine

## 2012-11-13 ENCOUNTER — Ambulatory Visit (INDEPENDENT_AMBULATORY_CARE_PROVIDER_SITE_OTHER): Payer: BC Managed Care – PPO | Admitting: Internal Medicine

## 2012-11-13 ENCOUNTER — Ambulatory Visit: Payer: BC Managed Care – PPO | Admitting: Oncology

## 2012-11-13 VITALS — BP 111/74 | HR 68 | Ht 63.0 in | Wt 162.4 lb

## 2012-11-13 DIAGNOSIS — I498 Other specified cardiac arrhythmias: Secondary | ICD-10-CM

## 2012-11-13 DIAGNOSIS — I471 Supraventricular tachycardia: Secondary | ICD-10-CM

## 2012-11-13 DIAGNOSIS — Z950 Presence of cardiac pacemaker: Secondary | ICD-10-CM

## 2012-11-13 LAB — PACEMAKER DEVICE OBSERVATION
AL AMPLITUDE: 6.9 mv
AL IMPEDENCE PM: 390 Ohm
AL THRESHOLD: 0.8 V
ATRIAL PACING PM: 73
BAMS-0001: 160 {beats}/min
BAMS-0002: 4 ms
DEVICE MODEL PM: 66195584
RV LEAD AMPLITUDE: 7.8 mv
RV LEAD IMPEDENCE PM: 565 Ohm
RV LEAD THRESHOLD: 2.3 V
VENTRICULAR PACING PM: 0

## 2012-11-13 NOTE — Assessment & Plan Note (Signed)
No recuirrences

## 2012-11-13 NOTE — Progress Notes (Signed)
Patient Care Team: Madelin Headings, MD as PCP - General   HPI  [name] is a 57 y.o. female Seen for device interrogation following cranial radiation recently identified metastatic melanoma.  She has a history of a pacemaker for sinus node dysfunction in the setting of dysautonomia  Past Medical History  Diagnosis Date  . 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 Hypoglycemia, unspecified   . Other nonspecific abnormal serum enzyme levels   . Cervicalgia   . Disturbance of skin sensation   . Other specified congenital anomalies of nervous system   . Swelling of limb   . Disorder of bone and cartilage, unspecified   . Atrial tachycardia   . CVA (cerebral infarction)     2012 with dizziness and vision change felt embolic from atrial tachy  . History of pacemaker   . POTS (postural orthostatic tachycardia syndrome)   . Diverticulosis   . Skin cancer   . Osteopenia   . DVT (deep venous thrombosis)   . Atrial fibrillation   . DM (diabetes mellitus)   . DVT (deep venous thrombosis)   . HLD (hyperlipidemia)   . HTN (hypertension)   . Pacemaker     autonomic dysfunction  . Complication of anesthesia     pt states wakes up with "shakes"  . PONV (postoperative nausea and vomiting)   . Stroke     left weaker     Past Surgical History  Procedure Laterality Date  . Insert / replace / remove pacemaker  2012    1999, x 3  . Ablation saphenous vein w/ rfa      2002  . Tonsillectomy and adenoidectomy    . Carpal tunnel release      left  . Nose surgery  2010, 2012    for nose bleeds x 2  . Fatty tumor removed      from chest  . Melanoma excision      with removal of lymph nodes, left shoulder  . Deep axillary sentinel node biopsy / excision      due to extensive Melanoma-right arm  . Laparoscopy  09/06/2011    Procedure: LAPAROSCOPY OPERATIVE;  Surgeon: Tresa Endo A. Ernestina Penna, MD;  Location: [facility];  Service: Gynecology;  Laterality: Left;  with  Left Ovarian Cystectomy   . Craniotomy N/A 09/30/2012    suboccipital craniectomy    Current Outpatient Prescriptions  Medication Sig Dispense Refill  . acetaminophen (TYLENOL) 500 MG tablet Take 1,000 mg by mouth every 6 (six) hours as needed for pain.      . Cholecalciferol 50000 UNITS TABS Take 50,000 Units by mouth every 7 (seven) days. On Saturdays (from [facility])      . clonazePAM (KLONOPIN) 0.5 MG tablet Take 1 tablet (0.5 mg total) by mouth 3 (three) times daily as needed for anxiety.  50 tablet  0  . cloNIDine (CATAPRES) 0.1 MG tablet Take 0.1 mg by mouth at bedtime.       . cyanocobalamin (,VITAMIN B-12,) 1000 MCG/ML injection Inject 1,000 mcg into the muscle every 30 (thirty) days.       Marland Kitchen dexamethasone (DECADRON) 4 MG tablet Take with meals: 1 tab Q 12 hr x 5 days, then 1/2 tab po QD x 5 days, then 1/2 tab QOD x 5 days.  30 tablet  0  . fluorouracil (EFUDEX) 5 % cream Apply 1 application topically daily as needed (hemorrhoids).       Marland Kitchen  HYDROcodone-acetaminophen (NORCO/VICODIN) 5-325 MG per tablet Take 1-2 tablets by mouth every 6 (six) hours as needed.  30 tablet  0  . hydrocortisone (ANUSOL-HC) 25 MG suppository Place 25 mg rectally at bedtime as needed for hemorrhoids.      . hydrocortisone-pramoxine (ANALPRAM-HC) 2.5-1 % rectal cream Place 1 application rectally at bedtime as needed for hemorrhoids.      . mesalamine (CANASA) 1000 MG suppository Place 1,000 mg rectally at bedtime as needed (hemorrhoids).       . metoprolol tartrate (LOPRESSOR) 25 MG tablet Take 25 mg by mouth daily.      . ondansetron (ZOFRAN-ODT) 4 MG disintegrating tablet Take 4 mg by mouth daily as needed for nausea.      . pantoprazole (PROTONIX) 40 MG tablet Take 40 mg by mouth daily as needed (acid reflux).       Marland Kitchen PHENobarbital (LUMINAL) 64.8 MG tablet Take 1 tablet (64.8 mg total) by mouth at bedtime.  90 tablet  3  . polyethylene glycol (MIRALAX / GLYCOLAX) packet Take 17 g by mouth at  bedtime. Mix with 8 oz of liquid and drink      . potassium chloride SA (K-DUR,KLOR-CON) 20 MEQ tablet Take 20 mEq by mouth daily.       . rosuvastatin (CRESTOR) 5 MG tablet Take 1 tablet (5 mg total) by mouth daily.  90 tablet  3   No current facility-administered medications for this visit.    Allergies  Allergen Reactions  . Doxycycline Hives  . Erythromycin Hives  . Penicillins Hives  . Preparation H [Pramox-Pe-Glycerin-Petrolatum] Hives    Cannot use  Cream or Suppositories   . Sulfonamide Derivatives Hives    Review of Systems negative except from HPI and PMH  Physical Exam BP 111/74  Pulse 68  Ht 5\' 3"  (1.6 m)  Wt 162 lb 6.4 oz (73.664 kg)  BMI 28.78 kg/m2   RRR No edema   Assessment and  Plan

## 2012-11-13 NOTE — Assessment & Plan Note (Signed)
ddevice interrogattion  Stable post XRT

## 2012-11-14 ENCOUNTER — Encounter: Payer: Self-pay | Admitting: Radiation Therapy

## 2012-11-14 NOTE — Progress Notes (Signed)
[name] called requesting instructions to taper down off of her steroid rather than taking 2 per day until her RX is complete. Dr. Joellen Jersey instructions are as follows:  2 per day for 2 more weeks 1 per day for 1 week 1/2 per day for one week  She can call us for a refill if needed.  I called and left a message for [name] explaining these instructions and requested she call back to make sure she understands.  [name] R.T.(R)(T) Radiation Special Procedures Navigator Radiation Oncology [facility] [phone]  Office [phone]  Fax [email or web address]

## 2012-11-20 ENCOUNTER — Other Ambulatory Visit: Payer: Self-pay | Admitting: Internal Medicine

## 2012-11-22 ENCOUNTER — Other Ambulatory Visit: Payer: Self-pay | Admitting: Oncology

## 2012-11-24 ENCOUNTER — Encounter: Payer: Self-pay | Admitting: Radiation Therapy

## 2012-11-24 ENCOUNTER — Other Ambulatory Visit: Payer: Self-pay | Admitting: *Deleted

## 2012-11-24 ENCOUNTER — Telehealth: Payer: Self-pay | Admitting: *Deleted

## 2012-11-24 NOTE — Telephone Encounter (Signed)
Called patient to calarify that per Dr.Magrinat on Saturday she is to take 8mg  total once a day, "Yes, " stated patient,i'm still getting nauseated if I try and taper down, headaches and tingling in my face', ", she is waiting tohear from Darl Pikes to see if MD wants another scan earlier 10:22 AM

## 2012-11-24 NOTE — Progress Notes (Signed)
[name] called me today to let me know that she started having similar symptoms of headache and very bad nausea in the mornings again after tapering down from her steroids as instructed. She called over the weekend and spoke with the Medical Oncologist on call, Dr. Darnelle Catalan, who told her to start taking 8 mg per day again to get the steroid back in her system. She was asking about her scans she has scheduled for Monday 10/6 and if she should have them done sooner to evaluate the  possible edema that could be causing her symptoms. After speaking with Dr. Mitzi Hansen, I instructed her to break the steroid dosage into 4 mg in the morning and 4 mg in the evening rather than  8 mg at one time. I also let her know that I do not see a head CT scheduled for Monday 10/6, only a chest scan and a follow-up visit with Dr. Mitzi Hansen and Dr. Truett Perna.       She is adamant that she wants a head scan done to see what is going on, and to see if there are any new areas to be concerned with. She states that Dr. Truett Perna was to set the brain and chest scan up at the same time, and that he has a certain treatment plan ready for discussion after getting the results of the scans. I let her know that I would call Dr. Kalman Drape nurse to inquire about the head scan and to see if we could do it on Friday 10/3 so it can be discussed during our 10/6 conference. I left a voicemail on the nurse line requesting a call    [name] R.T.(R)(T) Radiation Special Procedures Navigator Radiation Oncology [facility] [phone]  Office [phone]  Fax [email or web address]

## 2012-11-24 NOTE — Telephone Encounter (Signed)
Message from [name] in RadOnc asking why pt is not scheduled for CT head. Left message on voicemail for Darl Pikes, pt will have chest and head CT done 12/01/12.

## 2012-11-26 ENCOUNTER — Encounter: Payer: Self-pay | Admitting: Radiation Oncology

## 2012-11-27 ENCOUNTER — Encounter: Payer: Self-pay | Admitting: Radiation Therapy

## 2012-11-27 NOTE — Progress Notes (Signed)
[name] called today to express a concern with swelling in her face and ankles from the decadron she is taking. She understands that the decadron is important to continue due to the suspected edema from her surgery and radiation. However, she is a heart patient and has a pacemaker, which makes the accumulation of fluids a concern. [name] is drinking lots of fluids and trying to stay active and put her feet up to help with the swelling. Despite her efforts, there is no improvement.        I told her that I would pass this message on to Dr. Joellen Jersey nurse Liborio Nixon as well as Dr. Mitzi Hansen to see what the plan of action should be.   [name]

## 2012-11-30 ENCOUNTER — Other Ambulatory Visit: Payer: Self-pay | Admitting: Radiation Oncology

## 2012-12-01 ENCOUNTER — Other Ambulatory Visit: Payer: Self-pay | Admitting: Oncology

## 2012-12-01 ENCOUNTER — Ambulatory Visit (HOSPITAL_BASED_OUTPATIENT_CLINIC_OR_DEPARTMENT_OTHER): Payer: BC Managed Care – PPO | Admitting: Oncology

## 2012-12-01 ENCOUNTER — Encounter: Payer: Self-pay | Admitting: Radiation Oncology

## 2012-12-01 ENCOUNTER — Encounter (HOSPITAL_COMMUNITY): Payer: Self-pay

## 2012-12-01 ENCOUNTER — Ambulatory Visit
Admission: RE | Admit: 2012-12-01 | Discharge: 2012-12-01 | Disposition: A | Discharge status: Home / Self Care | Payer: BC Managed Care – PPO | Source: Ambulatory Visit | Attending: Radiation Oncology | Admitting: Radiation Oncology

## 2012-12-01 ENCOUNTER — Ambulatory Visit (HOSPITAL_COMMUNITY)
Admission: RE | Admit: 2012-12-01 | Discharge: 2012-12-01 | Disposition: A | Discharge status: Home / Self Care | Payer: BC Managed Care – PPO | Source: Ambulatory Visit | Attending: Oncology | Admitting: Oncology

## 2012-12-01 VITALS — BP 116/73 | HR 81 | Resp 18 | Ht 63.0 in | Wt 160.4 lb

## 2012-12-01 VITALS — BP 144/84 | HR 79 | Temp 98.1°F | Resp 20 | Wt 161.6 lb

## 2012-12-01 DIAGNOSIS — C7931 Secondary malignant neoplasm of brain: Secondary | ICD-10-CM

## 2012-12-01 DIAGNOSIS — C439 Malignant melanoma of skin, unspecified: Secondary | ICD-10-CM

## 2012-12-01 DIAGNOSIS — C4360 Malignant melanoma of unspecified upper limb, including shoulder: Secondary | ICD-10-CM | POA: Insufficient documentation

## 2012-12-01 DIAGNOSIS — K449 Diaphragmatic hernia without obstruction or gangrene: Secondary | ICD-10-CM | POA: Insufficient documentation

## 2012-12-01 DIAGNOSIS — Z950 Presence of cardiac pacemaker: Secondary | ICD-10-CM | POA: Insufficient documentation

## 2012-12-01 DIAGNOSIS — R918 Other nonspecific abnormal finding of lung field: Secondary | ICD-10-CM

## 2012-12-01 DIAGNOSIS — Z86718 Personal history of other venous thrombosis and embolism: Secondary | ICD-10-CM

## 2012-12-01 DIAGNOSIS — R911 Solitary pulmonary nodule: Secondary | ICD-10-CM | POA: Insufficient documentation

## 2012-12-01 DIAGNOSIS — G936 Cerebral edema: Secondary | ICD-10-CM | POA: Insufficient documentation

## 2012-12-01 HISTORY — DX: Personal history of irradiation: Z92.3

## 2012-12-01 MED ORDER — IOHEXOL 300 MG/ML  SOLN
100.0000 mL | Freq: Once | INTRAMUSCULAR | Status: AC | PRN
  Administered 2012-12-01: 100 mL via INTRAVENOUS

## 2012-12-01 MED ORDER — CLONAZEPAM 0.5 MG PO TABS: 0.5000 mg | ORAL_TABLET | Freq: Three times a day (TID) | ORAL | Status: DC | PRN

## 2012-12-01 MED ORDER — ESZOPICLONE 2 MG PO TABS: 2.0000 mg | ORAL_TABLET | Freq: Every day | ORAL | Status: DC

## 2012-12-01 MED ORDER — HYDROCODONE-ACETAMINOPHEN 5-325 MG PO TABS: 1.0000 | ORAL_TABLET | Freq: Four times a day (QID) | ORAL | Status: DC | PRN

## 2012-12-01 NOTE — Progress Notes (Signed)
   [facility]    OFFICE PROGRESS NOTE   INTERVAL HISTORY:   She returns for scheduled followup of metastatic melanoma. She continues Decadron. She develops nausea and balance difficulty when the Decadron is tapered. She reports feeling "shaky ". She has nausea in the mornings. She also numbness of the face in the mornings, this improves during the day. Good appetite. No seizures. No focal neurologic deficits.  Objective:  Vital signs in last 24 hours:  Blood pressure 116/73, pulse 81, temperature 0 F (-17.8 C), resp. rate 18, height 5\' 3"  (1.6 m), weight 160 lb 6.4 oz (72.757 kg), SpO2 100.00%.    HEENT: Neck without mass Lymphatics: No cervical, supraclavicular, axillary, or inguinal nodes Resp: Lungs clear bilaterally Cardio: Regular rate and rhythm GI: No hepatomegaly, nontender Vascular: No leg edema Neuro: Alert and oriented, the motor exam appears intact in the upper and lower extremities  Skin: Right arm scar without evidence of recurrent tumor   X-rays: CT of the head on 12/01/2012, compared to CT 11/06/2012 and brain MRI 10/22/2012-the medial left parietal hyperdense mass has increased in size, now measuring 24.5 x 22 mm. Surrounding vasogenic edema has also increased. Subtle focus of of cortical enhancement in the posterior right frontal lobe. No other definite lesions are evident.   CT the chest 12/01/2012, compared to 09/11/2012-the right lower lobe nodule has increased in size, now measuring 1.1 x 1.1 cm compared to 0.7 x 0.7 cm. No new pulmonary nodules.    Medications: I have reviewed the patient's current medications.  Assessment/Plan: 1. Metastatic melanoma  -Status post resection of a right arm melanoma, stage II A (T2b N0) in December 2009  -Metastatic melanoma, status post resection of a left cerebellar metastasis 09/30/2012, BRAF mutation identified  -Status post SRS treatment of the left cerebellum and a left parietal metastasis  10/24/2012  -Staging CTs and PET scan without evidence of distant metastatic disease aside from indeterminate small lung nodules  -Restaging CTs 12/01/2012 consistent with enlargement of the left parietal mass and right lower lobe lung nodule 2. Arrhythmia, pacemaker in place  3. Hypertension  4. History of a CVA  5. History of deep vein thrombosis    Disposition:  I discussed the restaging CT scans and reviewed the films with Ms. Peppers and her husband. It is unclear whether the brain findings represent disease progression or post treatment change. The right lung nodule has increased in size over the past 3 months and there are no new lesions.  She has been unable to wean off of Decadron since completing SRS to the left brain lesion, but otherwise appears asymptomatic from the metastatic melanoma.  We reviewed treatment options including continued observation, resection of the parietal brain mass, and systemic therapy with a MEK/BRAF directed therapy.  She is more comfortable with treatment as opposed to continued observation. I will contact Dr. Dan Europe to see if she will be a candidate for a clinical trial. I discussed the case with Dr. Mitzi Hansen and she may be a candidate for SRS to the right lung nodule and future resection of the left parietal lesion. She will continue the current Decadron dose. Ms. Haff will return for an office visit in 3 weeks.   Thornton Papas, MD  12/01/2012  4:09 PM

## 2012-12-01 NOTE — Progress Notes (Signed)
Radiation Oncology         [phone] ________________________________  Name: [name] MRN: [id number]  Date: 12/01/2012  DOB: 04-12-1955  Follow-Up Visit Note  CC: Lorretta Harp, MD  Barnett Abu, MD  Diagnosis:   Metastatic melanoma with brain metastasis  Interval Since Last Radiation:  5 weeks   Narrative:  The patient returns today for routine follow-up.  The patient had a repeat CT scan of the head and chest today and comes in to discuss the results. She states that clinically she has been fairly stable. Some ongoing occasional headaches but these have been less severe. The patient currently is on Decadron 4 mg twice a day. She has had some swelling and has been placed on Lasix by her cardiologist. She does complain of some fatigue and leg weakness. No shortness of breath. The patient does complain of some numbness in the facial area each morning but states that this resolves. This has been consistent but has not worsened and does resolve each day.                              ALLERGIES:  is allergic to doxycycline; erythromycin; penicillins; preparation h; and sulfonamide derivatives.  Meds: Current Outpatient Prescriptions  Medication Sig Dispense Refill  . acetaminophen (TYLENOL) 500 MG tablet Take 1,000 mg by mouth every 6 (six) hours as needed for pain.      . Cholecalciferol 50000 UNITS TABS Take 50,000 Units by mouth every 7 (seven) days. On Saturdays (from [facility])      . clonazePAM (KLONOPIN) 0.5 MG tablet Take 1 tablet (0.5 mg total) by mouth 3 (three) times daily as needed for anxiety.  50 tablet  0  . cloNIDine (CATAPRES) 0.1 MG tablet Take 0.1 mg by mouth at bedtime.       . cyanocobalamin (,VITAMIN B-12,) 1000 MCG/ML injection Inject 1,000 mcg into the muscle every 30 (thirty) days.       Marland Kitchen dexamethasone (DECADRON) 4 MG tablet Take 8 mg by mouth daily. Take with meals: 1 tab twice a day for a total of 8 mg.      . furosemide (LASIX) 40 MG  tablet Take 20 mg by mouth daily.      Marland Kitchen HYDROcodone-acetaminophen (NORCO/VICODIN) 5-325 MG per tablet Take 1-2 tablets by mouth every 6 (six) hours as needed.  30 tablet  0  . metoprolol tartrate (LOPRESSOR) 25 MG tablet Take 25 mg by mouth daily.      . ondansetron (ZOFRAN-ODT) 4 MG disintegrating tablet Take 4 mg by mouth daily as needed for nausea.      . pantoprazole (PROTONIX) 40 MG tablet Take 40 mg by mouth daily as needed (acid reflux).       Marland Kitchen PHENobarbital (LUMINAL) 64.8 MG tablet Take 1 tablet (64.8 mg total) by mouth at bedtime.  90 tablet  3  . polyethylene glycol (MIRALAX / GLYCOLAX) packet Take 17 g by mouth at bedtime. Mix with 8 oz of liquid and drink      . polyethylene glycol powder (GLYCOLAX/MIRALAX) powder Stir 17GM in 8OZ of water or juice 1 to 2 times daily for constipation  1054 g  1  . potassium chloride SA (K-DUR,KLOR-CON) 20 MEQ tablet Take 20 mEq by mouth daily.       . rosuvastatin (CRESTOR) 5 MG tablet Take 1 tablet (5 mg total) by mouth daily.  90 tablet  3  .  eszopiclone (LUNESTA) 2 MG TABS tablet Take 1 tablet (2 mg total) by mouth at bedtime. Take immediately before bedtime  30 tablet  1  . fluorouracil (EFUDEX) 5 % cream Apply 1 application topically daily as needed (hemorrhoids).       . hydrocortisone (ANUSOL-HC) 25 MG suppository Place 25 mg rectally at bedtime as needed for hemorrhoids.      . hydrocortisone-pramoxine (ANALPRAM-HC) 2.5-1 % rectal cream Place 1 application rectally at bedtime as needed for hemorrhoids.      . mesalamine (CANASA) 1000 MG suppository Place 1,000 mg rectally at bedtime as needed (hemorrhoids).        No current facility-administered medications for this encounter.    Physical Findings: The patient is in no acute distress. Patient is alert and oriented.  weight is 161 lb 9.6 oz (73.301 kg). Her oral temperature is 98.1 F (36.7 C). Her blood pressure is 144/84 and her pulse is 79. Her respiration is 20. Marland Kitchen   No thrush present  within the oral cavity  Lab Findings: Lab Results  Component Value Date   WBC 15.1* 11/06/2012   HGB 14.0 11/06/2012   HCT 40.6 11/06/2012   MCV 90.0 11/06/2012   PLT 210 11/06/2012     Radiographic Findings: Ct Head Wo Contrast  11/06/2012   CLINICAL DATA:  Melanoma. Prior radiation and stereotactic surgery of a left upper lobe brain metastasis. New onset headache, nausea, vomiting.  EXAM: CT HEAD WITHOUT CONTRAST  TECHNIQUE: Contiguous axial images were obtained from the base of the skull through the vertex without intravenous contrast.  COMPARISON:  MRI 10/22/2012  FINDINGS: Postoperative changes from left occipital craniectomy. Area of encephalomalacia in the left cerebellar hemisphere. There is a 2 cm hyperdense area within the left medial parietal lobe in the area of previously seen metastasis. This is similar size when compared to recent MRI. The hyperdense appearance could be related to melanin, hemorrhage or both. Mild surrounding edema, also stable. No hydrocephalus. No mass effect or midline shift.  IMPRESSION: Stable hyperdense left medial parietal metastasis.  Stable postoperative changes in the left cerebellar region.   Electronically Signed   By: Charlett Nose M.D.   On: 11/06/2012 10:22   Ct Head W Wo Contrast  12/01/2012   CLINICAL DATA:  Melanoma with brain metastases.  EXAM: CT HEAD WITHOUT AND WITH CONTRAST  TECHNIQUE: Contiguous axial images were obtained from the base of the skull through the vertex without and with intravenous contrast  CONTRAST:  OMNIPAQUE IOHEXOL 300 MG/ML  SOLN  COMPARISON:  CT head without contrast 11/06/2012. MRI brain 10/22/2012.  FINDINGS: The medial left parietal hyperdense mass lesion continues to increase in size, now measuring 24.5 x 22 mm compared with 19.7 by 20.2 mm on the prior exam. Surrounding vasogenic edema has increased as well. The lesion demonstrates mild enhancement.  The patient is status post suboccipital craniotomy for resection of  tumor on the left. Enhancement of the extracranial tissues persists. No definite intracranial enhancement is evident.  The a subtle focus of cortical enhancement is present in the posterior right frontal lobe on image 21 of series 9. This may just represent cortex although it does appear to be and subtle enhancement compared to the precontrast study. No other definite lesions are evident. The ventricles are of normal size. No significant extra-axial fluid collection is present.  The paranasal sinuses and mastoid air cells are clear.  IMPRESSION: 1. Continued growth of a hyperdense metastasis in the left parietal lobe.  2. Postsurgical changes of left suboccipital craniotomy with enhancing soft tissue along the extracranial portion of the surgical site no definite intracranial enhancement. 3. Question punctate enhancement of the posterior right frontal lobe.  The patient now has a pacemaker and cannot a follow by MRI. Recommend close surveillance of these areas for potential progression of tumor.   Electronically Signed   By: Gennette Pac M.D.   On: 12/01/2012 08:54   Ct Chest Wo Contrast  12/01/2012   CLINICAL DATA:  Restaging melanoma. Followup pulmonary nodules.  EXAM: CT CHEST WITHOUT CONTRAST  TECHNIQUE: Multidetector CT imaging of the chest was performed following the standard protocol without IV contrast.  COMPARISON:  09/11/2012  FINDINGS: There is no pleural effusion identified. No airspace consolidation or atelectasis identified. Pulmonary parenchymal nodule within the right lower lobe measures 1.1 x 1.1 cm, image 27/ series 6. Previously this measured 0.7 x 0.7 cm. No new pulmonary nodules are identified.  The heart size is normal. There is no pericardial effusion. There is a left chest wall pacer device with lead in the right atrial appendage and right ventricle. No mediastinal or hilar lymph nodes identified. Moderate size hiatal hernia is identified. No enlarged mediastinal or hilar lymph nodes. No  axillary or supraclavicular adenopathy.  Limited imaging through the upper abdomen is unremarkable. No acute findings noted.  No aggressive lytic or sclerotic bone lesions identified.  IMPRESSION: 1. Increase in size of right lower lobe nodule.   Electronically Signed   By: Signa Kell M.D.   On: 12/01/2012 08:42    Impression:    The patient clinically is doing reasonably well. She is on Decadron 4 mg twice a day at this point. She has decreased her steroids on a couple of occasions and both of these times we have had to go back up on this dose. The patient's CT scan of the head did show some increase in size of the left parietal lesion with increased edema surrounding this. I believe that this serves as a baseline for future comparison and we will repeat her CT scan of the brain at the 3 month mark out from treatment. I discussed with the patient that I do not expect this lesion to continue to enlarge, this can happen to some degree in the immediate posttreatment period. This does demonstrate continued edema and I believe that the patient will need to stay on Decadron at the current dose for at least 2-3 additional weeks. The patient stated that she is hesitant to decrease this again in a constant based on her prior experience and based on the enlargement of this one tumor. We can check again to see how she is doing in a couple of weeks. The patient's CT scan of the chest did show enlargement of a right lower lobe nodule, now measuring 11 mm versus 7 mm previously. This was not positive on PET scan but was below the typical threshold for detection. This nodule would be a candidate lesion for stereotactic body radiotherapy. It is fairly close to the esophagus and treating this as small as possible if we are going to proceed with be advisable. I will place the patient on lung conference to help clarify the best approach for possible biopsy. The patient is seeing Dr. Truett Perna and medical oncology later today to  discuss possible systemic treatment options.  Plan:  CT scan of the brain in approximately 7 weeks which will correspond to 3 months after radiosurgery. The patient will be placed on  lung conference for possible biopsy of the right lower lobe nodule.   Radene Gunning, M.D., Ph.D.

## 2012-12-01 NOTE — Progress Notes (Addendum)
Follow up brain mets  Palliative rad SRS tx 10/24/12 Has ha a head ache yesterday and today pain a 3 onm 1-10 scale, no c/o nausea, taking decadron 4mg  bid with a meal, appetite good, taste is bad from the decadron, saysweajness generalized and legs, and muscle weakness  taking lasix 20 mg daily now, no c/o blurred vision, fatiguyed, , wakes up every 2 hours, tingling in face in the mornings, no therush seen on tongue

## 2012-12-02 ENCOUNTER — Telehealth: Payer: Self-pay | Admitting: *Deleted

## 2012-12-02 NOTE — Telephone Encounter (Signed)
Dr.Ross called and ordered this patient to have a BMET to be drawn this week since she is taking Lasix every day. Called [facility] at [phone] and left a message to draw this blood on 10/10. Also called [facility] at [phone] ex [phone] and they advised that I fax an order because no nurse was available to speak with me. Order faxed to [facility] at [phone].

## 2012-12-04 ENCOUNTER — Telehealth: Payer: Self-pay | Admitting: Oncology

## 2012-12-04 ENCOUNTER — Other Ambulatory Visit: Payer: Self-pay | Admitting: *Deleted

## 2012-12-04 NOTE — Telephone Encounter (Signed)
Talked to  pt gave her appt for 12/22/12 MD

## 2012-12-06 ENCOUNTER — Encounter: Payer: Self-pay | Admitting: Internal Medicine

## 2012-12-07 ENCOUNTER — Encounter: Payer: Self-pay | Admitting: Internal Medicine

## 2012-12-08 ENCOUNTER — Telehealth: Payer: Self-pay | Admitting: *Deleted

## 2012-12-08 NOTE — Telephone Encounter (Signed)
After speaking w/MD: Symptoms more likely related to her tumor. Left VM that she needs to keep appointment with Dr. Dan Europe tomorrow-do whatever she needs to control her nausea to get there. Also needs to contact Dr. Verlee Rossetti office and make an appointment with him to be seen. Dr. Truett Perna will also call him and update him on her symptoms and status. Requested she call us after her appointment tomorrow with [facility].

## 2012-12-08 NOTE — Telephone Encounter (Signed)
Reports legs weaker over weekend-she is able to walk with cane still. Feels "shaky" to point even her voice is weaker. Headache today and persistent nausea. Episodes of vision being more blurred-comes and goes. BP has been high-was up to 142/116 last night, back to 90/72 this morning. Reports change in her condition over the weekend and MD has instructed her to report any changes. She is asking if this is from decadron or brain tumor? Due to see Dr. Dan Europe tomorrow, but if "if I still feel this bad, I won't be able to make the drive there".

## 2012-12-09 ENCOUNTER — Telehealth: Payer: Self-pay | Admitting: Internal Medicine

## 2012-12-09 NOTE — Telephone Encounter (Signed)
New Problem  Pt was seen by her oncologist in [place] who wants to put her on two different medications for brain cancer treatment that requires a EKG and a possible heart echo// prior to taking the drug.  Needs an ASAP appointment within this week so that they can order the drugs for this pt/// Please call.

## 2012-12-09 NOTE — Telephone Encounter (Signed)
Pt  Called to request an EKG and Echo to be done this week. Pt's Oncology doctor in [place] wants to prescribe  two different medications for brain cancer treatment that requires an EKG and possible an 2-D echo prior starting these drugs. Pt is aware that the Oncology MD's office needs to fax the request of these tests  to Dr. Paulina Fusi, for the test to be order. The scheduler  will call pt to schedule these tests. Pt states she will call the Oncology Md to fax the request to this office today. Pt would like to have these tests done this week if possible.

## 2012-12-10 ENCOUNTER — Other Ambulatory Visit: Payer: Self-pay | Admitting: *Deleted

## 2012-12-10 DIAGNOSIS — C7931 Secondary malignant neoplasm of brain: Secondary | ICD-10-CM

## 2012-12-10 NOTE — Telephone Encounter (Signed)
Per Dr. Graciela Husbands - scheduled Echo Fri 10/17 at 4pm, and nurse visit EKG. Patient agreeable to plan.

## 2012-12-12 ENCOUNTER — Ambulatory Visit (HOSPITAL_COMMUNITY): Payer: BC Managed Care – PPO | Attending: Internal Medicine | Admitting: Radiology

## 2012-12-12 ENCOUNTER — Ambulatory Visit (INDEPENDENT_AMBULATORY_CARE_PROVIDER_SITE_OTHER): Payer: BC Managed Care – PPO | Admitting: *Deleted

## 2012-12-12 ENCOUNTER — Other Ambulatory Visit: Payer: Self-pay | Admitting: Radiation Therapy

## 2012-12-12 VITALS — BP 124/86 | HR 71 | Wt 161.0 lb

## 2012-12-12 DIAGNOSIS — C7931 Secondary malignant neoplasm of brain: Secondary | ICD-10-CM

## 2012-12-12 DIAGNOSIS — R55 Syncope and collapse: Secondary | ICD-10-CM | POA: Insufficient documentation

## 2012-12-12 DIAGNOSIS — I10 Essential (primary) hypertension: Secondary | ICD-10-CM | POA: Insufficient documentation

## 2012-12-12 DIAGNOSIS — C801 Malignant (primary) neoplasm, unspecified: Secondary | ICD-10-CM

## 2012-12-12 DIAGNOSIS — I498 Other specified cardiac arrhythmias: Secondary | ICD-10-CM | POA: Insufficient documentation

## 2012-12-12 DIAGNOSIS — I4891 Unspecified atrial fibrillation: Secondary | ICD-10-CM | POA: Insufficient documentation

## 2012-12-12 DIAGNOSIS — E785 Hyperlipidemia, unspecified: Secondary | ICD-10-CM | POA: Insufficient documentation

## 2012-12-12 DIAGNOSIS — C719 Malignant neoplasm of brain, unspecified: Secondary | ICD-10-CM | POA: Insufficient documentation

## 2012-12-12 DIAGNOSIS — Z8673 Personal history of transient ischemic attack (TIA), and cerebral infarction without residual deficits: Secondary | ICD-10-CM | POA: Insufficient documentation

## 2012-12-12 DIAGNOSIS — R079 Chest pain, unspecified: Secondary | ICD-10-CM | POA: Insufficient documentation

## 2012-12-12 DIAGNOSIS — G9001 Carotid sinus syncope: Secondary | ICD-10-CM | POA: Insufficient documentation

## 2012-12-12 DIAGNOSIS — R002 Palpitations: Secondary | ICD-10-CM | POA: Insufficient documentation

## 2012-12-12 DIAGNOSIS — R42 Dizziness and giddiness: Secondary | ICD-10-CM | POA: Insufficient documentation

## 2012-12-12 DIAGNOSIS — R0602 Shortness of breath: Secondary | ICD-10-CM

## 2012-12-12 NOTE — Patient Instructions (Signed)
EKG faxed to [phone]

## 2012-12-12 NOTE — Progress Notes (Signed)
Echocardiogram performed.  

## 2012-12-15 ENCOUNTER — Encounter: Payer: Self-pay | Admitting: *Deleted

## 2012-12-15 ENCOUNTER — Telehealth: Payer: Self-pay | Admitting: Internal Medicine

## 2012-12-15 NOTE — Telephone Encounter (Signed)
New Problem  Pt calling stattes that [place] needs the ok to move forward w/ medications// please call back to discuss.

## 2012-12-15 NOTE — Telephone Encounter (Signed)
Spoke with patient who states that [facility] has everything they need. All tests that we sent to them looked good. They don't need anything further from [name].

## 2012-12-15 NOTE — Patient Instructions (Signed)
Faxed echo/med list/lab to Hilliard Clark RN at [facility] per request

## 2012-12-17 ENCOUNTER — Telehealth: Payer: Self-pay | Admitting: *Deleted

## 2012-12-17 ENCOUNTER — Telehealth: Payer: Self-pay | Admitting: Internal Medicine

## 2012-12-17 NOTE — Telephone Encounter (Signed)
Reviewed earlier note with Amanda Stanley, NP. Order received to bring patient in on 10/23 to be evaluated. Pt given 2:45 appt.   Instructed pt to report to ED if having difficulty swallowing, breathing or pain.  Pt voiced understanding.

## 2012-12-17 NOTE — Telephone Encounter (Signed)
New Problem:  Pt states she is on lasix and is cramping a lot. Pt also states she is swelling a lot. Pt states she is on decadron. Pt states would it be possible for [facility] to get an order to check her K+. Pt states she thinks her K+ is low. Pt would like to be advised.

## 2012-12-17 NOTE — Telephone Encounter (Signed)
Patient states that she has swelling around her neck and knees. She thinks it is the Decadron in which she is taking 4mg  in am and 2mg  in evening. I will discuss with Dr. Graciela Husbands and get back with her. Patient is agreeable to plan.

## 2012-12-17 NOTE — Telephone Encounter (Signed)
Message from pt. Stating she is taking Decadron and neck area is swollen, also stating she has a "golf ball size swelling where she swallows", and a swollen knee.  Return call to pt.  Pt. Denies fever and difficulty swallowing.  Pt. Stated when she took her scarf off, she noticed swelling around her neck and golf ball size knot between collar bones.

## 2012-12-18 ENCOUNTER — Telehealth: Payer: Self-pay | Admitting: Oncology

## 2012-12-18 ENCOUNTER — Ambulatory Visit (HOSPITAL_BASED_OUTPATIENT_CLINIC_OR_DEPARTMENT_OTHER): Payer: BC Managed Care – PPO | Admitting: Nurse Practitioner

## 2012-12-18 ENCOUNTER — Encounter: Payer: Self-pay | Admitting: Radiation Oncology

## 2012-12-18 ENCOUNTER — Ambulatory Visit (HOSPITAL_BASED_OUTPATIENT_CLINIC_OR_DEPARTMENT_OTHER): Payer: BC Managed Care – PPO | Admitting: Lab

## 2012-12-18 VITALS — BP 135/90 | HR 116 | Resp 20 | Ht 63.0 in | Wt 161.3 lb

## 2012-12-18 DIAGNOSIS — C7931 Secondary malignant neoplasm of brain: Secondary | ICD-10-CM

## 2012-12-18 DIAGNOSIS — C439 Malignant melanoma of skin, unspecified: Secondary | ICD-10-CM

## 2012-12-18 DIAGNOSIS — R220 Localized swelling, mass and lump, head: Secondary | ICD-10-CM

## 2012-12-18 DIAGNOSIS — Z7901 Long term (current) use of anticoagulants: Secondary | ICD-10-CM

## 2012-12-18 DIAGNOSIS — C4360 Malignant melanoma of unspecified upper limb, including shoulder: Secondary | ICD-10-CM

## 2012-12-18 LAB — CBC WITH DIFFERENTIAL/PLATELET
BASO%: 0.6 % (ref 0.0–2.0)
Basophils Absolute: 0.1 10*3/uL (ref 0.0–0.1)
EOS%: 0.2 % (ref 0.0–7.0)
Eosinophils Absolute: 0 10*3/uL (ref 0.0–0.5)
HCT: 41.3 % (ref 34.8–46.6)
HGB: 14.1 g/dL (ref 11.6–15.9)
LYMPH%: 13.8 % — ABNORMAL LOW (ref 14.0–49.7)
MCH: 31.1 pg (ref 25.1–34.0)
MCHC: 34.1 g/dL (ref 31.5–36.0)
MCV: 91.4 fL (ref 79.5–101.0)
MONO#: 1 10*3/uL — ABNORMAL HIGH (ref 0.1–0.9)
MONO%: 10.2 % (ref 0.0–14.0)
NEUT#: 7.1 10*3/uL — ABNORMAL HIGH (ref 1.5–6.5)
NEUT%: 75.2 % (ref 38.4–76.8)
Platelets: 234 10*3/uL (ref 145–400)
RBC: 4.52 10*6/uL (ref 3.70–5.45)
RDW: 14.2 % (ref 11.2–14.5)
WBC: 9.5 10*3/uL (ref 3.9–10.3)
lymph#: 1.3 10*3/uL (ref 0.9–3.3)

## 2012-12-18 LAB — COMPREHENSIVE METABOLIC PANEL (CC13)
ALT: 56 U/L — ABNORMAL HIGH (ref 0–55)
AST: 21 U/L (ref 5–34)
Albumin: 3.7 g/dL (ref 3.5–5.0)
Alkaline Phosphatase: 67 U/L (ref 40–150)
Anion Gap: 10 mEq/L (ref 3–11)
BUN: 18.8 mg/dL (ref 7.0–26.0)
CO2: 27 mEq/L (ref 22–29)
Calcium: 9.6 mg/dL (ref 8.4–10.4)
Chloride: 103 mEq/L (ref 98–109)
Creatinine: 0.7 mg/dL (ref 0.6–1.1)
Glucose: 86 mg/dl (ref 70–140)
Potassium: 3.7 mEq/L (ref 3.5–5.1)
Sodium: 140 mEq/L (ref 136–145)
Total Bilirubin: 0.32 mg/dL (ref 0.20–1.20)
Total Protein: 7.3 g/dL (ref 6.4–8.3)

## 2012-12-18 NOTE — Progress Notes (Signed)
The patient's case was discussed in multidisciplinary thoracic conference this morning. 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 We discussed possible options which are available and local treatment of this could potentially include surgical resection versus a course of stereotactic body radiotherapy. However, the patient appears to have good options with respect to systemic treatment. It was felt that this tumor might respond well to systemic treatment where local treatment may not be necessary/warranted for the  foreseeable future. We also discussed that this would represent measurable disease which also could guide systemic treatment. The consensus therefore was to hold off on any local treatment and I have discussed this with the patient. This appears to be consistent with the current plans which have been discussed with Dr. Truett Perna and Dr. Dan Europe.

## 2012-12-18 NOTE — Telephone Encounter (Signed)
gave pt appt for lab and MD  for November 2014, pt sent to labs today

## 2012-12-18 NOTE — Progress Notes (Addendum)
OFFICE PROGRESS NOTE  Interval history:  [name] is a 57 year old woman with metastatic melanoma. She is seen today for an unscheduled visit for evaluation of swelling at the lower neck.  She reports initially noticing the swelling 2 days ago. The swelling has decreased. No associated pain. She denies dysphagia. She has had intermittent mild shortness of breath recently. No chest pain.  She continues Decadron. Current dose is 4 mg every morning and 2 mg every afternoon.  She expects to begin 2 new medications for treatment of melanoma this week.   Objective: Blood pressure 135/90, pulse 116, resp. rate 20, height 5\' 3"  (1.6 m), weight 161 lb 4.8 oz (73.165 kg).  Oropharynx is without thrush or ulceration. No palpable cervical, supraclavicular or axillary lymph nodes. Lungs are clear. No wheezes or rales. Regular cardiac rhythm. Abdomen is soft and nontender. No hepatomegaly. Trace to 1+ edema at the lower legs bilaterally. Calves are nontender. Soft fullness at the sternal notch.   Lab Results: Lab Results  Component Value Date   WBC 9.5 12/18/2012   HGB 14.1 12/18/2012   HCT 41.3 12/18/2012   MCV 91.4 12/18/2012   PLT 234 12/18/2012    Chemistry:    Chemistry      Component Value Date/Time   NA 140 12/18/2012 1557   NA 139 11/06/2012 1139   K 3.7 12/18/2012 1557   K 3.8 11/06/2012 1139   CL 102 11/06/2012 1139   CO2 27 12/18/2012 1557   CO2 28 11/06/2012 1139   BUN 18.8 12/18/2012 1557   BUN 16 11/06/2012 1139   CREATININE 0.7 12/18/2012 1557   CREATININE 0.70 11/06/2012 1139      Component Value Date/Time   CALCIUM 9.6 12/18/2012 1557   CALCIUM 9.7 11/06/2012 1139   ALKPHOS 67 12/18/2012 1557   ALKPHOS 84 11/06/2012 1139   AST 21 12/18/2012 1557   AST 79* 11/06/2012 1139   ALT 56* 12/18/2012 1557   ALT 96* 11/06/2012 1139   BILITOT 0.32 12/18/2012 1557   BILITOT 0.4 11/06/2012 1139       Studies/Results: Ct Head W Wo Contrast  12/01/2012   CLINICAL DATA:   Melanoma with brain metastases.  EXAM: CT HEAD WITHOUT AND WITH CONTRAST  TECHNIQUE: Contiguous axial images were obtained from the base of the skull through the vertex without and with intravenous contrast  CONTRAST:  OMNIPAQUE IOHEXOL 300 MG/ML  SOLN  COMPARISON:  CT head without contrast 11/06/2012. MRI brain 10/22/2012.  FINDINGS: The medial left parietal hyperdense mass lesion continues to increase in size, now measuring 24.5 x 22 mm compared with 19.7 by 20.2 mm on the prior exam. Surrounding vasogenic edema has increased as well. The lesion demonstrates mild enhancement.  The patient is status post suboccipital craniotomy for resection of tumor on the left. Enhancement of the extracranial tissues persists. No definite intracranial enhancement is evident.  The a subtle focus of cortical enhancement is present in the posterior right frontal lobe on image 21 of series 9. This may just represent cortex although it does appear to be and subtle enhancement compared to the precontrast study. No other definite lesions are evident. The ventricles are of normal size. No significant extra-axial fluid collection is present.  The paranasal sinuses and mastoid air cells are clear.  IMPRESSION: 1. Continued growth of a hyperdense metastasis in the left parietal lobe. 2. Postsurgical changes of left suboccipital craniotomy with enhancing soft tissue along the extracranial portion of the surgical site no definite  intracranial enhancement. 3. Question punctate enhancement of the posterior right frontal lobe.  The patient now has a pacemaker and cannot a follow by MRI. Recommend close surveillance of these areas for potential progression of tumor.   Electronically Signed   By: Gennette Pac M.D.   On: 12/01/2012 08:54   Ct Chest Wo Contrast  12/01/2012   CLINICAL DATA:  Restaging melanoma. Followup pulmonary nodules.  EXAM: CT CHEST WITHOUT CONTRAST  TECHNIQUE: Multidetector CT imaging of the chest was performed  following the standard protocol without IV contrast.  COMPARISON:  09/11/2012  FINDINGS: There is no pleural effusion identified. No airspace consolidation or atelectasis identified. Pulmonary parenchymal nodule within the right lower lobe measures 1.1 x 1.1 cm, image 27/ series 6. Previously this measured 0.7 x 0.7 cm. No new pulmonary nodules are identified.  The heart size is normal. There is no pericardial effusion. There is a left chest wall pacer device with lead in the right atrial appendage and right ventricle. No mediastinal or hilar lymph nodes identified. Moderate size hiatal hernia is identified. No enlarged mediastinal or hilar lymph nodes. No axillary or supraclavicular adenopathy.  Limited imaging through the upper abdomen is unremarkable. No acute findings noted.  No aggressive lytic or sclerotic bone lesions identified.  IMPRESSION: 1. Increase in size of right lower lobe nodule.   Electronically Signed   By: Signa Kell M.D.   On: 12/01/2012 08:42    Medications: I have reviewed the patient's current medications.  Assessment/Plan:  1. Metastatic melanoma  -Status post resection of a right arm melanoma, stage II A (T2b N0) in December 2009  -Metastatic melanoma, status post resection of a left cerebellar metastasis 09/30/2012, BRAF mutation identified  -Status post SRS treatment of the left cerebellum and a left parietal metastasis 10/24/2012  -Staging CTs and PET scan without evidence of distant metastatic disease aside from indeterminate small lung nodules  -Restaging CTs 12/01/2012 consistent with enlargement of the left parietal mass and right lower lobe lung nodule  2. Arrhythmia, pacemaker in place  3. Hypertension  4. History of a CVA  5. History of deep vein thrombosis  6. "Swelling" of the lower neck 12/18/2012.  Disposition-she appears stable. The "swelling" at the lower neck/sternal notch is likely fat redistribution related to chronic steroid use. She understands to  contact the office with any change in the size.  She plans to begin Dabrafenib and tremetinib as prescribed by Dr. Dan Europe later this week. We will obtain a baseline CBC and chemistry panel today.  She will return for a followup visit in approximately 3 weeks. She will contact the office in the interim as outlined above or with any other problems.  Patient seen with Dr. Truett Perna.   [name] ANP/GNP-BC  This was assured visit with [name]. The fullness at the sternal notch appears to be fatty tissue. She is scheduled to begin treatment for metastatic melanoma as prescribed by Dr. Dan Europe. We ordered baseline laboratory studies today. Treatment will be directed by Dr. Ellison Carwin. She would like to continue followup here while on treatment.  [name], M.D.

## 2012-12-19 ENCOUNTER — Telehealth: Payer: Self-pay | Admitting: *Deleted

## 2012-12-19 NOTE — Telephone Encounter (Signed)
Call from pt requesting lab results to be faxed to her at [phone]. Same done.

## 2012-12-22 ENCOUNTER — Ambulatory Visit: Payer: BC Managed Care – PPO | Admitting: Oncology

## 2012-12-25 ENCOUNTER — Ambulatory Visit: Payer: BC Managed Care – PPO | Admitting: Oncology

## 2013-01-01 ENCOUNTER — Other Ambulatory Visit: Payer: Self-pay

## 2013-01-09 ENCOUNTER — Ambulatory Visit (HOSPITAL_BASED_OUTPATIENT_CLINIC_OR_DEPARTMENT_OTHER): Payer: BC Managed Care – PPO | Admitting: Oncology

## 2013-01-09 ENCOUNTER — Telehealth: Payer: Self-pay | Admitting: Oncology

## 2013-01-09 ENCOUNTER — Other Ambulatory Visit (HOSPITAL_BASED_OUTPATIENT_CLINIC_OR_DEPARTMENT_OTHER): Payer: BC Managed Care – PPO | Admitting: Lab

## 2013-01-09 VITALS — BP 130/89 | HR 83 | Temp 98.1°F | Resp 18 | Ht 63.0 in | Wt 163.5 lb

## 2013-01-09 DIAGNOSIS — Z86718 Personal history of other venous thrombosis and embolism: Secondary | ICD-10-CM

## 2013-01-09 DIAGNOSIS — R911 Solitary pulmonary nodule: Secondary | ICD-10-CM

## 2013-01-09 DIAGNOSIS — C7931 Secondary malignant neoplasm of brain: Secondary | ICD-10-CM

## 2013-01-09 DIAGNOSIS — C4360 Malignant melanoma of unspecified upper limb, including shoulder: Secondary | ICD-10-CM

## 2013-01-09 DIAGNOSIS — C439 Malignant melanoma of skin, unspecified: Secondary | ICD-10-CM

## 2013-01-09 DIAGNOSIS — R5381 Other malaise: Secondary | ICD-10-CM

## 2013-01-09 DIAGNOSIS — R21 Rash and other nonspecific skin eruption: Secondary | ICD-10-CM

## 2013-01-09 DIAGNOSIS — I10 Essential (primary) hypertension: Secondary | ICD-10-CM

## 2013-01-09 LAB — CBC WITH DIFFERENTIAL/PLATELET
BASO%: 0.7 % (ref 0.0–2.0)
Basophils Absolute: 0.1 10*3/uL (ref 0.0–0.1)
EOS%: 0.6 % (ref 0.0–7.0)
Eosinophils Absolute: 0 10*3/uL (ref 0.0–0.5)
HCT: 41 % (ref 34.8–46.6)
HGB: 13.9 g/dL (ref 11.6–15.9)
LYMPH%: 26.7 % (ref 14.0–49.7)
MCH: 31.2 pg (ref 25.1–34.0)
MCHC: 33.9 g/dL (ref 31.5–36.0)
MCV: 92 fL (ref 79.5–101.0)
MONO#: 1.1 10*3/uL — ABNORMAL HIGH (ref 0.1–0.9)
MONO%: 13.3 % (ref 0.0–14.0)
NEUT#: 4.7 10*3/uL (ref 1.5–6.5)
NEUT%: 58.7 % (ref 38.4–76.8)
Platelets: 292 10*3/uL (ref 145–400)
RBC: 4.46 10*6/uL (ref 3.70–5.45)
RDW: 14 % (ref 11.2–14.5)
WBC: 8 10*3/uL (ref 3.9–10.3)
lymph#: 2.1 10*3/uL (ref 0.9–3.3)

## 2013-01-09 LAB — COMPREHENSIVE METABOLIC PANEL (CC13)
ALT: 47 U/L (ref 0–55)
AST: 29 U/L (ref 5–34)
Albumin: 3.8 g/dL (ref 3.5–5.0)
Alkaline Phosphatase: 106 U/L (ref 40–150)
Anion Gap: 11 mEq/L (ref 3–11)
BUN: 18.2 mg/dL (ref 7.0–26.0)
CO2: 25 mEq/L (ref 22–29)
Calcium: 9.6 mg/dL (ref 8.4–10.4)
Chloride: 103 mEq/L (ref 98–109)
Creatinine: 0.8 mg/dL (ref 0.6–1.1)
Glucose: 102 mg/dl (ref 70–140)
Potassium: 3.6 mEq/L (ref 3.5–5.1)
Sodium: 139 mEq/L (ref 136–145)
Total Bilirubin: 0.38 mg/dL (ref 0.20–1.20)
Total Protein: 7.6 g/dL (ref 6.4–8.3)

## 2013-01-09 NOTE — Progress Notes (Signed)
   [facility]    OFFICE PROGRESS NOTE   INTERVAL HISTORY:   She returns for scheduled followup of metastatic melanoma. She began systemic therapy on 12/17/2012. No diarrhea. She has a rash at the dorsum of the left hand with mild associated swelling in the mornings. This resolves after a few hours. She also has an erythematous rash at the posterior thighs in the mornings that resolves after 2-3 hours. She complains of a "burning" sensation in the throat in the mornings.  She attempted to taper the Decadron to off but the headaches returned. The headaches resolve when she resumed Decadron at a dose of 2 mg twice daily.  Her chief complaint is fatigue. She works 4-5 hours per day. She has nausea, but no vomiting.  She has noted leg weakness when going from a sitting to standing position  Objective:  Vital signs in last 24 hours:  Blood pressure 130/89, pulse 83, temperature 98.1 F (36.7 C), temperature source Oral, resp. rate 18, height 5\' 3"  (1.6 m), weight 163 lb 8 oz (74.163 kg), SpO2 100.00%.    HEENT: No thrush or ulcers Resp: Lungs clear bilaterally Cardio: Regular rate and rhythm GI: No hepatomegaly, nontender Vascular: No leg edema Neuro: The motor exam appears intact in the upper and lower extremities. 4/5 strength with flexion at the hips bilaterally  Skin: No rash     Lab Results:  Lab Results  Component Value Date   WBC 8.0 01/09/2013   HGB 13.9 01/09/2013   HCT 41.0 01/09/2013   MCV 92.0 01/09/2013   PLT 292 01/09/2013   ANC 4.7 Potassium 3.6, creatinine 0.8, alkaline phosphatase 106, AST 29, ALT 47, bilirubin 0.38   Medications: I have reviewed the patient's current medications.  Assessment/Plan: 1. Metastatic melanoma  -Status post resection of a right arm melanoma, stage II A (T2b N0) in December 2009  -Metastatic melanoma, status post resection of a left cerebellar metastasis 09/30/2012, BRAF mutation identified  -Status post SRS  treatment of the left cerebellum and a left parietal metastasis 10/24/2012  -Staging CTs and PET scan without evidence of distant metastatic disease aside from indeterminate small lung nodules  -Restaging CTs 12/01/2012 consistent with enlargement of the left parietal mass and right lower lobe lung nodule  -Initiation of dabrafenib and tremetinib 12/17/2012 2. Arrhythmia, pacemaker in place  3. Hypertension  4. History of a CVA  5. History of deep vein thrombosis  6. proximal leg weakness-likely related to chronic steroid use 7. intermittent rash at the posterior thighs and left hand-likely related to systemic therapy    Disposition:  [name] appears to be tolerating the chemotherapy well. She will continue the current treatment. She is scheduled for a restaging brain CT on 01/20/2013, ordered by Dr. Mitzi Hansen. She would like to proceed with this scan as scheduled. She will see Dr. Dan Europe at [facility] for restaging scans on 02/05/2013. She will return for an office visit here on 02/17/2013.  The leg weakness is most likely related to Decadron therapy. She plans to begin a physical therapy program.   Thornton Papas, MD  01/09/2013  1:59 PM

## 2013-01-09 NOTE — Telephone Encounter (Signed)
Gave pt appt for ML only for december 2014

## 2013-01-13 ENCOUNTER — Telehealth: Payer: Self-pay | Admitting: *Deleted

## 2013-01-13 ENCOUNTER — Ambulatory Visit: Payer: BC Managed Care – PPO | Admitting: Occupational Therapy

## 2013-01-13 ENCOUNTER — Telehealth: Payer: Self-pay | Admitting: Internal Medicine

## 2013-01-13 ENCOUNTER — Ambulatory Visit: Payer: BC Managed Care – PPO

## 2013-01-13 NOTE — Telephone Encounter (Signed)
Left message on [name] voicemail okay for IV fluids. She will call back with questions.

## 2013-01-13 NOTE — Telephone Encounter (Signed)
Call from pt reporting burning in her throat at night. Reports she called Dr. Dan Europe and received antiemetic and MMW. Was told she may need to be seen in the office here. Reviewed with Dr. Truett Perna: Meds should help, call office PRN. Left message on voicemail for pt to call if she needs to be worked in for another office visit.

## 2013-01-13 NOTE — Telephone Encounter (Signed)
New problem    [name] need verbal orders for pt have IV fluids. ASAP.

## 2013-01-19 ENCOUNTER — Telehealth: Payer: Self-pay | Admitting: *Deleted

## 2013-01-19 NOTE — Telephone Encounter (Signed)
Called [facility] and was given nurse's name, Rodena Piety: Per nurse, she was told by the on call physician, Dr. Clelia Croft to go to the emergency room . Patient chose not to follow this advice and wait until the morning. RN reports she also instructed patient to go to the emergency room.

## 2013-01-19 NOTE — Telephone Encounter (Signed)
Left VM at 0835 that she had fever of 104 last night.  @1045 -Retrieved VM and called her back: temp was 104 last night as well as on Saturday night. Reports her home health nurse called the "on call" MD last night and was told to take Tylenol and call office in the morning. However, she is currently en route to see Dr. Dan Europe at [facility]. She will have MD call Dr. Truett Perna directly if necessary.

## 2013-01-20 ENCOUNTER — Ambulatory Visit (HOSPITAL_COMMUNITY)
Admission: RE | Admit: 2013-01-20 | Discharge: 2013-01-20 | Disposition: A | Discharge status: Home / Self Care | Payer: BC Managed Care – PPO | Source: Ambulatory Visit | Attending: Radiation Oncology | Admitting: Radiation Oncology

## 2013-01-20 ENCOUNTER — Encounter (HOSPITAL_COMMUNITY): Payer: Self-pay

## 2013-01-20 DIAGNOSIS — C7931 Secondary malignant neoplasm of brain: Secondary | ICD-10-CM | POA: Insufficient documentation

## 2013-01-20 DIAGNOSIS — C439 Malignant melanoma of skin, unspecified: Secondary | ICD-10-CM | POA: Insufficient documentation

## 2013-01-20 DIAGNOSIS — G930 Cerebral cysts: Secondary | ICD-10-CM | POA: Insufficient documentation

## 2013-01-20 MED ORDER — IOHEXOL 300 MG/ML  SOLN
100.0000 mL | Freq: Once | INTRAMUSCULAR | Status: AC | PRN
  Administered 2013-01-20: 100 mL via INTRAVENOUS

## 2013-01-21 ENCOUNTER — Other Ambulatory Visit: Payer: Self-pay | Admitting: Neurological Surgery

## 2013-01-21 ENCOUNTER — Encounter: Payer: Self-pay | Admitting: Radiation Oncology

## 2013-01-21 ENCOUNTER — Other Ambulatory Visit (HOSPITAL_COMMUNITY): Payer: Self-pay | Admitting: Neurological Surgery

## 2013-01-21 DIAGNOSIS — C7931 Secondary malignant neoplasm of brain: Secondary | ICD-10-CM

## 2013-01-21 NOTE — Progress Notes (Signed)
Radiation Oncology         [phone] ________________________________  Name: [name] MRN: [id number]  Date: 01/21/2013  DOB: [birth year]  Multidisciplinary Neuro Oncology Clinic Follow-Up Visit Note  CC: Lorretta Harp, MD  No ref. provider found  Diagnosis:   57 yo woman s/p SRS for 2 brain metastases on 10/24/2012  1. Left cerebellar post-op resection cavity target was treated to 15 Gy  2. Left parietal intact metastasis target was treated to 20 Gy.   Interval Since Last Radiation:  3  months  Narrative:  The patient returns today for routine follow-up with myself and Dr. Danielle Dess from neurosurgery.  The recent films were presented in our multidisciplinary conference with neuroradiology just prior to the clinic.  She remains dependent on Dexamethasone 2 mg BID and becomes symptomatic with headaches on attempts to wean.  She is noting weakness when attempting to stand up from a squatting position.  She has also had fever and cough related to immunotherapy.                              ALLERGIES:  is allergic to doxycycline; erythromycin; penicillins; preparation h; and sulfonamide derivatives.  Meds: Current Outpatient Prescriptions  Medication Sig Dispense Refill  . acetaminophen (TYLENOL) 500 MG tablet Take 1,000 mg by mouth every 6 (six) hours as needed for pain.      . Cholecalciferol 50000 UNITS TABS Take 50,000 Units by mouth every 7 (seven) days. On Saturdays (from [facility])      . clonazePAM (KLONOPIN) 0.5 MG tablet Take 1 tablet (0.5 mg total) by mouth 3 (three) times daily as needed for anxiety.  50 tablet  0  . cloNIDine (CATAPRES) 0.1 MG tablet Take 0.1 mg by mouth at bedtime.       . cyanocobalamin (,VITAMIN B-12,) 1000 MCG/ML injection Inject 1,000 mcg into the muscle every 30 (thirty) days.       Marland Kitchen dexamethasone (DECADRON) 4 MG tablet Take 2 mg by mouth 2 (two) times daily.       . eszopiclone (LUNESTA) 2 MG TABS tablet Take 1 tablet (2 mg  total) by mouth at bedtime. Take immediately before bedtime  30 tablet  1  . fluorouracil (EFUDEX) 5 % cream Apply 1 application topically daily as needed (hemorrhoids).       . furosemide (LASIX) 40 MG tablet Take 20 mg by mouth daily.      Marland Kitchen HYDROcodone-acetaminophen (NORCO/VICODIN) 5-325 MG per tablet Take 1-2 tablets by mouth every 6 (six) hours as needed.  30 tablet  0  . hydrocortisone (ANUSOL-HC) 25 MG suppository Place 25 mg rectally at bedtime as needed for hemorrhoids.      . hydrocortisone-pramoxine (ANALPRAM-HC) 2.5-1 % rectal cream Place 1 application rectally at bedtime as needed for hemorrhoids.      Marland Kitchen MEKINIST 2 MG tablet Take 2 mg by mouth daily.      . mesalamine (CANASA) 1000 MG suppository Place 1,000 mg rectally at bedtime as needed (hemorrhoids).       . metFORMIN (GLUCOPHAGE) 500 MG tablet Take 500 mg by mouth daily after breakfast.      . metoprolol tartrate (LOPRESSOR) 25 MG tablet Take 25 mg by mouth daily.      . ondansetron (ZOFRAN-ODT) 4 MG disintegrating tablet Take 4 mg by mouth daily as needed for nausea.      . polyethylene glycol powder (GLYCOLAX/MIRALAX) powder Stir 17GM  in 8OZ of water or juice 1 to 2 times daily for constipation  1054 g  1  . potassium chloride SA (K-DUR,KLOR-CON) 20 MEQ tablet Take 20 mEq by mouth daily.       . ranitidine (ZANTAC) 150 MG tablet Take 150 mg by mouth 2 (two) times daily.      . rosuvastatin (CRESTOR) 5 MG tablet Take 1 tablet (5 mg total) by mouth daily.  90 tablet  3  . TAFINLAR 75 MG capsule Take 150 mg by mouth 2 (two) times daily.       No current facility-administered medications for this visit.    Physical Findings: The patient is in no acute distress. Patient is alert and oriented.  vitals were not taken for this visit.Marland Kitchen Neuro exam reveals no hemiparesis.  She does have proximal muscle weakness  No significant changes.  Lab Findings: Lab Results  Component Value Date   WBC 8.0 01/09/2013   HGB 13.9 01/09/2013    HCT 41.0 01/09/2013   MCV 92.0 01/09/2013   PLT 292 01/09/2013      Chemistry      Component Value Date/Time   NA 139 01/09/2013 1131   NA 139 11/06/2012 1139   K 3.6 01/09/2013 1131   K 3.8 11/06/2012 1139   CL 102 11/06/2012 1139   CO2 25 01/09/2013 1131   CO2 28 11/06/2012 1139   BUN 18.2 01/09/2013 1131   BUN 16 11/06/2012 1139   CREATININE 0.8 01/09/2013 1131   CREATININE 0.70 11/06/2012 1139      Component Value Date/Time   CALCIUM 9.6 01/09/2013 1131   CALCIUM 9.7 11/06/2012 1139   ALKPHOS 106 01/09/2013 1131   ALKPHOS 84 11/06/2012 1139   AST 29 01/09/2013 1131   AST 79* 11/06/2012 1139   ALT 47 01/09/2013 1131   ALT 96* 11/06/2012 1139   BILITOT 0.38 01/09/2013 1131   BILITOT 0.4 11/06/2012 1139       Radiographic Findings: Ct Head W Wo Contrast  01/20/2013   CLINICAL DATA:  Metastatic melanoma. Left cerebellar surgical resection. Stereotactic radio surgery completed August 2014. Headache.  EXAM: CT HEAD WITHOUT AND WITH CONTRAST  TECHNIQUE: Contiguous axial images were obtained from the base of the skull through the vertex without and with intravenous contrast  CONTRAST:  OMNIPAQUE IOHEXOL 300 MG/ML  SOLN  COMPARISON:  CT 12/01/2012. CT 11/06/2012. MRI 10/22/2012. Patient has a pacemaker  FINDINGS: 1 mm postcontrast axial images of the brain were obtained with sagittal and coronal reconstruction.  Postop left occipital craniotomy and resection of left cerebellar lesion. The resection cavity is unchanged and without evidence of enhancing recurrent mass lesion.  Left medial parietal mass lesion is now cystic. Previous this was a solid lesion which was hyperdense on unenhanced CT with surrounding edema. The cyst now measures 25 x 31 mm. Medially, there is a hyperdense cystic and solid mural nodule which does not enhance significantly. This may be due to melanin or blood or both within the cyst. No significant surrounding white matter edema.  Ventricle size is normal.  No  midline shift.  No other lesions are seen. The prior study questioned an enhancing lesion in the right frontal lobe which is not visible today and probably was normal cortex.  IMPRESSION: Postsurgical changes left cerebellum are stable without recurrent tumor.  Left medial parietal mass is now a cystic mass measuring 25 x 31 mm. It is difficult to determine if there is any residual viable tumor. There  is a cystic and solid hyperdense mural nodule medially within the cyst which does not enhance.  No new lesions identified.   Electronically Signed   By: Marlan Palau M.D.   On: 01/20/2013 15:28    Impression:  The patient has enlargement of her treated left parietal metastasis potentially representing persistent/progressive metastatic disease versus adverse radiation effect (ARE) or radionecrosis.  In the setting, of immunotherapy following SRS, there is an increasing recognition of elevated risk for radionecrosis.  Currently, edema related to the her left parietal lesion is preventing weaning from steroids, and the steroids may be contributing to her proximal muscle weakness.  Steroids may also limit the efficacy of of immunotherapy.  During our conference and subsequent discussion with the patient, we raised the option of addressing the mass effect surgically, either through image guided resection versus aspiration.  Plan:  We discussed the options above with the patient.  We offered her the following options: 1.  Ongoing observation with continuation of steroids and short interval CT 2.  Aspiration of the cystic component of the left parietal metastasis with or without catheter placement. 3.  Image-guided resection of the metastasis. We reviewed the pros and cons of each option including but not limited to a risk of right hemiparesis due to the proximity of the left motor strip.  We also discussed the case by phone with her melanoma oncologist, Dr. Ralene Bathe.  The patient and all physicians expressed  agreement that definitive surgical resection may be best, given the relief of mass effect, the histologic confirmation of tumor or necrosis, and the benefit of weaning off steroids.  At this point, we will continue to consider the options and move toward possible image-guided resection during the week of  02/09/13.  _____________________________________  Artist Pais. Kathrynn Running, M.D. and  Barnett Abu, M.D.

## 2013-02-01 ENCOUNTER — Other Ambulatory Visit: Payer: Self-pay | Admitting: Internal Medicine

## 2013-02-02 ENCOUNTER — Encounter (HOSPITAL_COMMUNITY): Payer: Self-pay | Admitting: Pharmacy Technician

## 2013-02-05 ENCOUNTER — Encounter (HOSPITAL_COMMUNITY): Payer: Self-pay

## 2013-02-05 ENCOUNTER — Encounter (HOSPITAL_COMMUNITY)
Admission: RE | Admit: 2013-02-05 | Discharge: 2013-02-05 | Disposition: A | Discharge status: Home / Self Care | Payer: BC Managed Care – PPO | Source: Ambulatory Visit | Attending: Neurological Surgery | Admitting: Neurological Surgery

## 2013-02-05 ENCOUNTER — Ambulatory Visit (HOSPITAL_COMMUNITY)
Admission: RE | Admit: 2013-02-05 | Discharge: 2013-02-05 | Disposition: A | Discharge status: Home / Self Care | Payer: BC Managed Care – PPO | Source: Ambulatory Visit | Attending: Neurological Surgery | Admitting: Neurological Surgery

## 2013-02-05 DIAGNOSIS — C716 Malignant neoplasm of cerebellum: Secondary | ICD-10-CM | POA: Insufficient documentation

## 2013-02-05 DIAGNOSIS — Z9889 Other specified postprocedural states: Secondary | ICD-10-CM | POA: Insufficient documentation

## 2013-02-05 DIAGNOSIS — C7931 Secondary malignant neoplasm of brain: Secondary | ICD-10-CM

## 2013-02-05 HISTORY — DX: Family history of other specified conditions: Z84.89

## 2013-02-05 HISTORY — DX: Unspecified asthma, uncomplicated: J45.909

## 2013-02-05 LAB — CBC
HCT: 39.6 % (ref 36.0–46.0)
Hemoglobin: 13.5 g/dL (ref 12.0–15.0)
MCH: 32.1 pg (ref 26.0–34.0)
MCHC: 34.1 g/dL (ref 30.0–36.0)
MCV: 94.3 fL (ref 78.0–100.0)
Platelets: 315 10*3/uL (ref 150–400)
RBC: 4.2 MIL/uL (ref 3.87–5.11)
RDW: 14.2 % (ref 11.5–15.5)
WBC: 10.2 10*3/uL (ref 4.0–10.5)

## 2013-02-05 LAB — BASIC METABOLIC PANEL
BUN: 25 mg/dL — ABNORMAL HIGH (ref 6–23)
CO2: 25 mEq/L (ref 19–32)
Calcium: 9.5 mg/dL (ref 8.4–10.5)
Chloride: 102 mEq/L (ref 96–112)
Creatinine, Ser: 0.93 mg/dL (ref 0.50–1.10)
GFR calc Af Amer: 79 mL/min — ABNORMAL LOW (ref >90)
GFR calc non Af Amer: 68 mL/min — ABNORMAL LOW (ref >90)
Glucose, Bld: 150 mg/dL — ABNORMAL HIGH (ref 70–99)
Potassium: 3.9 mEq/L (ref 3.5–5.1)
Sodium: 139 mEq/L (ref 135–145)

## 2013-02-05 MED ORDER — IOHEXOL 300 MG/ML  SOLN
100.0000 mL | Freq: Once | INTRAMUSCULAR | Status: AC | PRN
  Administered 2013-02-05: 100 mL via INTRAVENOUS

## 2013-02-05 NOTE — Pre-Procedure Instructions (Addendum)
[name]  02/05/2013   Your procedure is scheduled on:  02/09/13  Report to [facility] Short Stay (use Main Entrance "A'') at 5:30 AM.  Call this number if you have problems the morning of surgery: [phone]   Remember:   Do not eat food or drink liquids after midnight.   Take these medicines the morning of surgery with A SIP OF WATER:dexamethasone (DECADRON) 4 MG tablet, metoprolol tartrate (LOPRESSOR) 25 MG tablet, ranitidine (ZANTAC) 150 MG tablet If needed: acetaminophen (TYLENOL) 500 MG tablet for pain, clonazePAM (KLONOPIN) 0.5 MG tablet for anxiety, ondansetron (ZOFRAN-ODT) 4 MG disintegrating tablet for nausea. Stop taking Aspirin, vitamins and herbal medications. Do not take any NSAIDs ie: Ibuprofen, Advil, Naproxen or any medication containing Aspirin.  Do not wear jewelry, make-up or nail polish.  Do not wear lotions, powders, or perfumes. You may wear deodorant.  Do not shave 48 hours prior to surgery  Do not bring valuables to the hospital.  [facility] is not responsible for any belongings or valuables.               Contacts, dentures or bridgework may not be worn into surgery.  Leave suitcase in the car. After surgery it may be brought to your room.  For patients admitted to the hospital, discharge time is determined by your  treatment team.               Patients discharged the day of surgery will not be allowed to drive home.  Name and phone number of your driver: family  Special Instructions: Incentive Spirometry - Practice and bring it with you on the day of surgery.   Please read over the following fact sheets that you were given: Pain Booklet, Coughing and Deep Breathing, Blood Transfusion Information and Surgical Site Infection Prevention

## 2013-02-08 MED ORDER — VANCOMYCIN HCL IN DEXTROSE 1-5 GM/200ML-% IV SOLN
1000.0000 mg | INTRAVENOUS | Status: AC
  Administered 2013-02-09: 1000 mg via INTRAVENOUS
  Filled 2013-02-08: qty 200

## 2013-02-09 ENCOUNTER — Encounter (HOSPITAL_COMMUNITY): Payer: Self-pay | Admitting: *Deleted

## 2013-02-09 ENCOUNTER — Encounter (HOSPITAL_COMMUNITY)
Admission: RE | Disposition: A | Discharge status: Home / Self Care | Payer: Self-pay | Source: Ambulatory Visit | Attending: Neurological Surgery

## 2013-02-09 ENCOUNTER — Inpatient Hospital Stay (HOSPITAL_COMMUNITY): Payer: BC Managed Care – PPO | Admitting: Anesthesiology

## 2013-02-09 ENCOUNTER — Encounter (HOSPITAL_COMMUNITY): Payer: BC Managed Care – PPO | Admitting: Anesthesiology

## 2013-02-09 ENCOUNTER — Inpatient Hospital Stay (HOSPITAL_COMMUNITY)
Admission: RE | Admit: 2013-02-09 | Discharge: 2013-02-11 | DRG: 027 | Disposition: A | Discharge status: Home / Self Care | Payer: BC Managed Care – PPO | Source: Ambulatory Visit | Attending: Neurological Surgery | Admitting: Neurological Surgery

## 2013-02-09 DIAGNOSIS — C439 Malignant melanoma of skin, unspecified: Secondary | ICD-10-CM | POA: Diagnosis present

## 2013-02-09 DIAGNOSIS — Z950 Presence of cardiac pacemaker: Secondary | ICD-10-CM

## 2013-02-09 DIAGNOSIS — I10 Essential (primary) hypertension: Secondary | ICD-10-CM | POA: Diagnosis present

## 2013-02-09 DIAGNOSIS — Z86718 Personal history of other venous thrombosis and embolism: Secondary | ICD-10-CM

## 2013-02-09 DIAGNOSIS — I4891 Unspecified atrial fibrillation: Secondary | ICD-10-CM | POA: Diagnosis present

## 2013-02-09 DIAGNOSIS — C799 Secondary malignant neoplasm of unspecified site: Secondary | ICD-10-CM

## 2013-02-09 DIAGNOSIS — Z85820 Personal history of malignant melanoma of skin: Secondary | ICD-10-CM

## 2013-02-09 DIAGNOSIS — Z8673 Personal history of transient ischemic attack (TIA), and cerebral infarction without residual deficits: Secondary | ICD-10-CM

## 2013-02-09 DIAGNOSIS — C7931 Secondary malignant neoplasm of brain: Principal | ICD-10-CM | POA: Diagnosis present

## 2013-02-09 DIAGNOSIS — E119 Type 2 diabetes mellitus without complications: Secondary | ICD-10-CM | POA: Diagnosis present

## 2013-02-09 DIAGNOSIS — E785 Hyperlipidemia, unspecified: Secondary | ICD-10-CM | POA: Diagnosis present

## 2013-02-09 HISTORY — PX: CRANIOTOMY: SHX93

## 2013-02-09 LAB — PREPARE RBC (CROSSMATCH)

## 2013-02-09 LAB — GLUCOSE, CAPILLARY: Glucose-Capillary: 117 mg/dL — ABNORMAL HIGH (ref 70–99)

## 2013-02-09 SURGERY — CRANIOTOMY TUMOR EXCISION
Anesthesia: General | Site: Head | Laterality: Left

## 2013-02-09 MED ORDER — PROPOFOL 10 MG/ML IV BOLUS
INTRAVENOUS | Status: DC | PRN
  Administered 2013-02-09: 100 mg via INTRAVENOUS

## 2013-02-09 MED ORDER — ACETAMINOPHEN 10 MG/ML IV SOLN
INTRAVENOUS | Status: AC
  Administered 2013-02-09: 1000 mg via INTRAVENOUS
  Filled 2013-02-09: qty 100

## 2013-02-09 MED ORDER — NEOSTIGMINE METHYLSULFATE 1 MG/ML IJ SOLN
INTRAMUSCULAR | Status: DC | PRN
  Administered 2013-02-09: 3 mg via INTRAVENOUS

## 2013-02-09 MED ORDER — ONDANSETRON HCL 4 MG/2ML IJ SOLN
4.0000 mg | INTRAMUSCULAR | Status: DC | PRN
  Administered 2013-02-10: 4 mg via INTRAVENOUS
  Filled 2013-02-09: qty 2

## 2013-02-09 MED ORDER — ONDANSETRON HCL 4 MG/2ML IJ SOLN
INTRAMUSCULAR | Status: DC | PRN
  Administered 2013-02-09 (×2): 4 mg via INTRAVENOUS

## 2013-02-09 MED ORDER — BUPIVACAINE HCL (PF) 0.5 % IJ SOLN
INTRAMUSCULAR | Status: DC | PRN
  Administered 2013-02-09: 2 mL

## 2013-02-09 MED ORDER — HYDROMORPHONE HCL PF 1 MG/ML IJ SOLN
0.2500 mg | INTRAMUSCULAR | Status: DC | PRN
  Administered 2013-02-09: 0.5 mg via INTRAVENOUS

## 2013-02-09 MED ORDER — DEXAMETHASONE SODIUM PHOSPHATE 4 MG/ML IJ SOLN
INTRAMUSCULAR | Status: DC | PRN
  Administered 2013-02-09: 10 mg via INTRAVENOUS

## 2013-02-09 MED ORDER — POLYETHYLENE GLYCOL 3350 17 GM/SCOOP PO POWD: 1.0000 | Freq: Once | ORAL | Status: DC

## 2013-02-09 MED ORDER — ROCURONIUM BROMIDE 100 MG/10ML IV SOLN
INTRAVENOUS | Status: DC | PRN
  Administered 2013-02-09: 20 mg via INTRAVENOUS
  Administered 2013-02-09: 30 mg via INTRAVENOUS
  Administered 2013-02-09: 50 mg via INTRAVENOUS

## 2013-02-09 MED ORDER — PROMETHAZINE HCL 25 MG PO TABS: 12.5000 mg | ORAL_TABLET | ORAL | Status: DC | PRN

## 2013-02-09 MED ORDER — HYDROCORTISONE ACETATE 25 MG RE SUPP: 25.0000 mg | Freq: Every evening | RECTAL | Status: DC | PRN

## 2013-02-09 MED ORDER — ONDANSETRON 4 MG PO TBDP
4.0000 mg | ORAL_TABLET | Freq: Every day | ORAL | Status: DC | PRN
  Administered 2013-02-11: 4 mg via ORAL
  Filled 2013-02-09 (×2): qty 1

## 2013-02-09 MED ORDER — PHENYLEPHRINE HCL 10 MG/ML IJ SOLN
10.0000 mg | INTRAVENOUS | Status: DC | PRN
  Administered 2013-02-09: 25 ug/min via INTRAVENOUS

## 2013-02-09 MED ORDER — POTASSIUM CHLORIDE CRYS ER 20 MEQ PO TBCR
20.0000 meq | EXTENDED_RELEASE_TABLET | Freq: Every day | ORAL | Status: DC
  Administered 2013-02-09 – 2013-02-10 (×2): 10 meq via ORAL
  Administered 2013-02-11: 20 meq via ORAL
  Filled 2013-02-09 (×3): qty 1

## 2013-02-09 MED ORDER — LABETALOL HCL 5 MG/ML IV SOLN: 10.0000 mg | INTRAVENOUS | Status: DC | PRN

## 2013-02-09 MED ORDER — METOPROLOL TARTRATE 25 MG PO TABS
25.0000 mg | ORAL_TABLET | Freq: Once | ORAL | Status: AC
  Administered 2013-02-09: 25 mg via ORAL

## 2013-02-09 MED ORDER — FENTANYL CITRATE 0.05 MG/ML IJ SOLN
INTRAMUSCULAR | Status: DC | PRN
  Administered 2013-02-09: 50 ug via INTRAVENOUS
  Administered 2013-02-09: 450 ug via INTRAVENOUS

## 2013-02-09 MED ORDER — SODIUM CHLORIDE 0.9 % IV SOLN
500.0000 mg | Freq: Two times a day (BID) | INTRAVENOUS | Status: DC
  Administered 2013-02-09 – 2013-02-10 (×2): 500 mg via INTRAVENOUS
  Filled 2013-02-09 (×4): qty 5

## 2013-02-09 MED ORDER — ARTIFICIAL TEARS OP OINT
TOPICAL_OINTMENT | OPHTHALMIC | Status: DC | PRN
  Administered 2013-02-09: 1 via OPHTHALMIC

## 2013-02-09 MED ORDER — DEXAMETHASONE 6 MG PO TABS
6.0000 mg | ORAL_TABLET | Freq: Four times a day (QID) | ORAL | Status: DC
  Administered 2013-02-09 – 2013-02-10 (×3): 6 mg via ORAL
  Filled 2013-02-09 (×6): qty 1

## 2013-02-09 MED ORDER — METOPROLOL TARTRATE 25 MG PO TABS
25.0000 mg | ORAL_TABLET | Freq: Every day | ORAL | Status: DC
  Administered 2013-02-10 – 2013-02-11 (×2): 25 mg via ORAL
  Filled 2013-02-09 (×3): qty 1

## 2013-02-09 MED ORDER — CLONAZEPAM 0.5 MG PO TABS
0.5000 mg | ORAL_TABLET | Freq: Three times a day (TID) | ORAL | Status: DC | PRN
  Administered 2013-02-09 – 2013-02-11 (×5): 0.5 mg via ORAL
  Filled 2013-02-09 (×5): qty 1

## 2013-02-09 MED ORDER — ONDANSETRON HCL 4 MG PO TABS: 4.0000 mg | ORAL_TABLET | ORAL | Status: DC | PRN

## 2013-02-09 MED ORDER — CYANOCOBALAMIN 1000 MCG/ML IJ SOLN
1000.0000 ug | INTRAMUSCULAR | Status: DC
  Filled 2013-02-09: qty 1

## 2013-02-09 MED ORDER — PANTOPRAZOLE SODIUM 40 MG IV SOLR
40.0000 mg | Freq: Every day | INTRAVENOUS | Status: DC
  Administered 2013-02-09: 40 mg via INTRAVENOUS
  Filled 2013-02-09 (×2): qty 40

## 2013-02-09 MED ORDER — CLONIDINE HCL 0.1 MG PO TABS
0.1000 mg | ORAL_TABLET | Freq: Every day | ORAL | Status: DC
  Administered 2013-02-10: 0.1 mg via ORAL
  Filled 2013-02-09 (×4): qty 1

## 2013-02-09 MED ORDER — OXYCODONE HCL 5 MG/5ML PO SOLN: 5.0000 mg | Freq: Once | ORAL | Status: DC | PRN

## 2013-02-09 MED ORDER — FAMOTIDINE 20 MG PO TABS
20.0000 mg | ORAL_TABLET | Freq: Two times a day (BID) | ORAL | Status: DC
  Administered 2013-02-09 – 2013-02-11 (×4): 20 mg via ORAL
  Filled 2013-02-09 (×6): qty 1

## 2013-02-09 MED ORDER — DEXAMETHASONE 4 MG PO TABS
4.0000 mg | ORAL_TABLET | Freq: Three times a day (TID) | ORAL | Status: DC
  Filled 2013-02-09 (×3): qty 1

## 2013-02-09 MED ORDER — MEPERIDINE HCL 25 MG/ML IJ SOLN: 6.2500 mg | INTRAMUSCULAR | Status: DC | PRN

## 2013-02-09 MED ORDER — HYDROCODONE-ACETAMINOPHEN 5-325 MG PO TABS
1.0000 | ORAL_TABLET | ORAL | Status: DC | PRN
  Administered 2013-02-09 (×2): 1 via ORAL
  Administered 2013-02-10 (×2): 0.5 via ORAL
  Administered 2013-02-10 (×3): 1 via ORAL
  Administered 2013-02-10: 0.5 via ORAL
  Administered 2013-02-11 (×3): 1 via ORAL
  Filled 2013-02-09 (×11): qty 1

## 2013-02-09 MED ORDER — PROMETHAZINE HCL 25 MG/ML IJ SOLN: 6.2500 mg | INTRAMUSCULAR | Status: DC | PRN

## 2013-02-09 MED ORDER — LIDOCAINE-EPINEPHRINE 1 %-1:100000 IJ SOLN
INTRAMUSCULAR | Status: DC | PRN
  Administered 2013-02-09: 2 mL

## 2013-02-09 MED ORDER — MESALAMINE 1000 MG RE SUPP: 1000.0000 mg | Freq: Every evening | RECTAL | Status: DC | PRN

## 2013-02-09 MED ORDER — DEXAMETHASONE 4 MG PO TABS: 4.0000 mg | ORAL_TABLET | Freq: Three times a day (TID) | ORAL | Status: DC

## 2013-02-09 MED ORDER — SODIUM CHLORIDE 0.9 % IV SOLN
500.0000 mg | INTRAVENOUS | Status: AC
  Administered 2013-02-09: 500 mg via INTRAVENOUS
  Filled 2013-02-09: qty 5

## 2013-02-09 MED ORDER — MIDAZOLAM HCL 5 MG/5ML IJ SOLN
INTRAMUSCULAR | Status: DC | PRN
  Administered 2013-02-09: 1 mg via INTRAVENOUS

## 2013-02-09 MED ORDER — HYDROMORPHONE HCL PF 1 MG/ML IJ SOLN
INTRAMUSCULAR | Status: AC
  Filled 2013-02-09: qty 1

## 2013-02-09 MED ORDER — LIDOCAINE HCL (CARDIAC) 20 MG/ML IV SOLN
INTRAVENOUS | Status: DC | PRN
  Administered 2013-02-09: 40 mg via INTRAVENOUS

## 2013-02-09 MED ORDER — LABETALOL HCL 5 MG/ML IV SOLN
INTRAVENOUS | Status: DC | PRN
  Administered 2013-02-09: 20 mg via INTRAVENOUS

## 2013-02-09 MED ORDER — SODIUM CHLORIDE 0.9 % IV SOLN
INTRAVENOUS | Status: DC | PRN
  Administered 2013-02-09: 07:00:00 via INTRAVENOUS

## 2013-02-09 MED ORDER — SUCCINYLCHOLINE CHLORIDE 20 MG/ML IJ SOLN
INTRAMUSCULAR | Status: DC | PRN
  Administered 2013-02-09: 100 mg via INTRAVENOUS

## 2013-02-09 MED ORDER — MICROFIBRILLAR COLL HEMOSTAT EX PADS
MEDICATED_PAD | CUTANEOUS | Status: DC | PRN
  Administered 2013-02-09: 1 via TOPICAL

## 2013-02-09 MED ORDER — FLUOROURACIL 5 % EX CREA: 1.0000 "application " | TOPICAL_CREAM | Freq: Every day | CUTANEOUS | Status: DC | PRN

## 2013-02-09 MED ORDER — THROMBIN 20000 UNITS EX SOLR
CUTANEOUS | Status: DC | PRN
  Administered 2013-02-09: 09:00:00 via TOPICAL

## 2013-02-09 MED ORDER — ATORVASTATIN CALCIUM 10 MG PO TABS
10.0000 mg | ORAL_TABLET | Freq: Every day | ORAL | Status: DC
  Administered 2013-02-09 – 2013-02-11 (×3): 10 mg via ORAL
  Filled 2013-02-09 (×3): qty 1

## 2013-02-09 MED ORDER — HYDROCORTISONE ACE-PRAMOXINE 2.5-1 % RE CREA: 1.0000 "application " | TOPICAL_CREAM | Freq: Every evening | RECTAL | Status: DC | PRN

## 2013-02-09 MED ORDER — METOPROLOL TARTRATE 12.5 MG HALF TABLET
ORAL_TABLET | ORAL | Status: AC
  Filled 2013-02-09: qty 2

## 2013-02-09 MED ORDER — SODIUM CHLORIDE 0.9 % IV SOLN
INTRAVENOUS | Status: DC | PRN
  Administered 2013-02-09: 08:00:00 via INTRAVENOUS

## 2013-02-09 MED ORDER — MORPHINE SULFATE 2 MG/ML IJ SOLN
1.0000 mg | INTRAMUSCULAR | Status: DC | PRN
  Administered 2013-02-09: 1 mg via INTRAVENOUS
  Administered 2013-02-09: 2 mg via INTRAVENOUS
  Administered 2013-02-10: 1 mg via INTRAVENOUS
  Filled 2013-02-09 (×3): qty 1

## 2013-02-09 MED ORDER — OXYCODONE HCL 5 MG PO TABS: 5.0000 mg | ORAL_TABLET | Freq: Once | ORAL | Status: DC | PRN

## 2013-02-09 MED ORDER — MAGIC MOUTHWASH W/LIDOCAINE
15.0000 mL | Freq: Four times a day (QID) | ORAL | Status: DC | PRN
  Filled 2013-02-09: qty 15

## 2013-02-09 MED ORDER — GLYCOPYRROLATE 0.2 MG/ML IJ SOLN
INTRAMUSCULAR | Status: DC | PRN
  Administered 2013-02-09: 0.4 mg via INTRAVENOUS

## 2013-02-09 MED ORDER — METFORMIN HCL 500 MG PO TABS
500.0000 mg | ORAL_TABLET | Freq: Every day | ORAL | Status: DC
  Administered 2013-02-10 – 2013-02-11 (×2): 500 mg via ORAL
  Filled 2013-02-09 (×3): qty 1

## 2013-02-09 MED ORDER — FUROSEMIDE 40 MG PO TABS
40.0000 mg | ORAL_TABLET | Freq: Every day | ORAL | Status: DC
  Administered 2013-02-09 – 2013-02-11 (×3): 40 mg via ORAL
  Filled 2013-02-09 (×3): qty 1

## 2013-02-09 SURGICAL SUPPLY — 87 items
BAG DECANTER FOR FLEXI CONT (MISCELLANEOUS) ×2 IMPLANT
BANDAGE GAUZE ELAST BULKY 4 IN (GAUZE/BANDAGES/DRESSINGS) ×2 IMPLANT
BIT DRILL WIRE PASS 1.3MM (BIT) IMPLANT
BLADE SURG ROTATE 9660 (MISCELLANEOUS) ×6 IMPLANT
BRUSH SCRUB EZ 1% IODOPHOR (MISCELLANEOUS) ×2 IMPLANT
BRUSH SCRUB EZ PLAIN DRY (MISCELLANEOUS) ×2 IMPLANT
BUR ACORN 6.0 PRECISION (BURR) ×2 IMPLANT
BUR ROUTER D-58 CRANI (BURR) IMPLANT
CANISTER SUCT 3000ML (MISCELLANEOUS) ×2 IMPLANT
CLIP TI MEDIUM 6 (CLIP) IMPLANT
CONT SPEC 4OZ CLIKSEAL STRL BL (MISCELLANEOUS) ×2 IMPLANT
CORDS BIPOLAR (ELECTRODE) ×2 IMPLANT
COTTONBALL LRG STERILE PKG (GAUZE/BANDAGES/DRESSINGS) IMPLANT
COVER TABLE BACK 60X90 (DRAPES) ×4 IMPLANT
DECANTER SPIKE VIAL GLASS SM (MISCELLANEOUS) ×2 IMPLANT
DRAIN CHANNEL 10M FLAT 3/4 FLT (DRAIN) IMPLANT
DRAIN SUBARACHNOID (WOUND CARE) IMPLANT
DRAPE LONG LASER MIC (DRAPES) IMPLANT
DRAPE MICROSCOPE LEICA (MISCELLANEOUS) IMPLANT
DRAPE MICROSCOPE ZEISS OPMI (DRAPES) ×4 IMPLANT
DRAPE SURG IRRIG POUCH 19X23 (DRAPES) IMPLANT
DRAPE WARM FLUID 44X44 (DRAPE) ×2 IMPLANT
DRILL WIRE PASS 1.3MM (BIT)
DRSG ADAPTIC 3X8 NADH LF (GAUZE/BANDAGES/DRESSINGS) ×2 IMPLANT
DRSG OPSITE POSTOP 4X10 (GAUZE/BANDAGES/DRESSINGS) ×2 IMPLANT
DRSG OPSITE POSTOP 4X8 (GAUZE/BANDAGES/DRESSINGS) ×2 IMPLANT
DURAPREP 6ML APPLICATOR 50/CS (WOUND CARE) ×2 IMPLANT
ELECT CAUTERY BLADE 6.4 (BLADE) ×2 IMPLANT
ELECT REM PT RETURN 9FT ADLT (ELECTROSURGICAL) ×2
ELECTRODE REM PT RTRN 9FT ADLT (ELECTROSURGICAL) ×1 IMPLANT
EVACUATOR 1/8 PVC DRAIN (DRAIN) IMPLANT
EVACUATOR SILICONE 100CC (DRAIN) IMPLANT
GAUZE SPONGE 4X4 16PLY XRAY LF (GAUZE/BANDAGES/DRESSINGS) IMPLANT
GLOVE BIO SURGEON STRL SZ7.5 (GLOVE) IMPLANT
GLOVE BIOGEL M 8.0 STRL (GLOVE) ×6 IMPLANT
GLOVE BIOGEL PI IND STRL 7.0 (GLOVE) ×4 IMPLANT
GLOVE BIOGEL PI IND STRL 7.5 (GLOVE) IMPLANT
GLOVE BIOGEL PI IND STRL 8.5 (GLOVE) ×1 IMPLANT
GLOVE BIOGEL PI INDICATOR 7.0 (GLOVE) ×4
GLOVE BIOGEL PI INDICATOR 7.5 (GLOVE)
GLOVE BIOGEL PI INDICATOR 8.5 (GLOVE) ×1
GLOVE ECLIPSE 8.5 STRL (GLOVE) ×2 IMPLANT
GLOVE EXAM NITRILE LRG STRL (GLOVE) IMPLANT
GLOVE EXAM NITRILE MD LF STRL (GLOVE) IMPLANT
GLOVE EXAM NITRILE XL STR (GLOVE) IMPLANT
GLOVE EXAM NITRILE XS STR PU (GLOVE) IMPLANT
GLOVE INDICATOR 8.5 STRL (GLOVE) ×6 IMPLANT
GLOVE SURG SS PI 7.0 STRL IVOR (GLOVE) ×8 IMPLANT
GOWN BRE IMP SLV AUR LG STRL (GOWN DISPOSABLE) IMPLANT
GOWN BRE IMP SLV AUR XL STRL (GOWN DISPOSABLE) ×6 IMPLANT
GOWN STRL REIN 2XL LVL4 (GOWN DISPOSABLE) ×2 IMPLANT
HEMOSTAT SURGICEL 2X14 (HEMOSTASIS) IMPLANT
HOOK DURA (MISCELLANEOUS) ×2 IMPLANT
KIT BASIN OR (CUSTOM PROCEDURE TRAY) ×2 IMPLANT
KIT ROOM TURNOVER OR (KITS) ×2 IMPLANT
NEEDLE HYPO 22GX1.5 SAFETY (NEEDLE) ×2 IMPLANT
NS IRRIG 1000ML POUR BTL (IV SOLUTION) ×2 IMPLANT
PACK CRANIOTOMY (CUSTOM PROCEDURE TRAY) ×2 IMPLANT
PAD ARMBOARD 7.5X6 YLW CONV (MISCELLANEOUS) ×6 IMPLANT
PATTIES SURGICAL .25X.25 (GAUZE/BANDAGES/DRESSINGS) IMPLANT
PATTIES SURGICAL .5 X.5 (GAUZE/BANDAGES/DRESSINGS) IMPLANT
PATTIES SURGICAL .5 X3 (DISPOSABLE) IMPLANT
PATTIES SURGICAL 1/4 X 3 (GAUZE/BANDAGES/DRESSINGS) IMPLANT
PATTIES SURGICAL 1X1 (DISPOSABLE) IMPLANT
PATTIES SURGICAL 3 X3 (GAUZE/BANDAGES/DRESSINGS)
PATTIES SURGICAL 3X3 (GAUZE/BANDAGES/DRESSINGS) IMPLANT
PIN MAYFIELD SKULL DISP (PIN) IMPLANT
PLATE 1.5  2HOLE MED NEURO (Plate) ×3 IMPLANT
PLATE 1.5 2HOLE MED NEURO (Plate) ×3 IMPLANT
PLATE 1.5/0.5 13MM BURR HOLE (Plate) ×2 IMPLANT
RUBBERBAND STERILE (MISCELLANEOUS) ×8 IMPLANT
SCREW SELF DRILL HT 1.5/4MM (Screw) ×22 IMPLANT
SPONGE GAUZE 4X4 12PLY (GAUZE/BANDAGES/DRESSINGS) IMPLANT
SPONGE NEURO XRAY DETECT 1X3 (DISPOSABLE) IMPLANT
SPONGE SURGIFOAM ABS GEL 100 (HEMOSTASIS) IMPLANT
STAPLER SKIN PROX WIDE 3.9 (STAPLE) ×2 IMPLANT
SUT ETHILON 3 0 FSL (SUTURE) IMPLANT
SUT NURALON 4 0 TR CR/8 (SUTURE) ×4 IMPLANT
SUT VIC AB 2-0 CP2 18 (SUTURE) ×6 IMPLANT
SYR 20ML ECCENTRIC (SYRINGE) ×2 IMPLANT
SYR CONTROL 10ML LL (SYRINGE) ×2 IMPLANT
TIP SONASTAR STD MISONIX 1.9 (TRAY / TRAY PROCEDURE) IMPLANT
TOWEL OR 17X24 6PK STRL BLUE (TOWEL DISPOSABLE) ×2 IMPLANT
TOWEL OR 17X26 10 PK STRL BLUE (TOWEL DISPOSABLE) ×2 IMPLANT
TRAY FOLEY CATH 14FRSI W/METER (CATHETERS) IMPLANT
UNDERPAD 30X30 INCONTINENT (UNDERPADS AND DIAPERS) IMPLANT
WATER STERILE IRR 1000ML POUR (IV SOLUTION) ×2 IMPLANT

## 2013-02-09 NOTE — H&P (Signed)
[name] is an 57 y.o. female.   Chief Complaint: Metastatic tumor left parietal lobe HPI: [place] goes a 57 year old right-handed individual who has had significant metastatic melanoma resected several months ago from the suboccipital region showed a small lesion in left parietal lobe she had stereotactic radiation and the lesion has increased in size dramatically. She is now admitted to undergo surgical resection of this lesion using Stealth guidance  Past Medical History  Diagnosis Date  . 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 Hypoglycemia, unspecified   . Other nonspecific abnormal serum enzyme levels   . Cervicalgia   . Disturbance of skin sensation   . Other specified congenital anomalies of nervous system   . Swelling of limb   . Disorder of bone and cartilage, unspecified   . Atrial tachycardia   . CVA (cerebral infarction)     2012 with dizziness and vision change felt embolic from atrial tachy  . History of pacemaker   . POTS (postural orthostatic tachycardia syndrome)   . Diverticulosis   . Osteopenia   . DVT (deep venous thrombosis)   . Atrial fibrillation   . DM (diabetes mellitus)   . DVT (deep venous thrombosis)   . HLD (hyperlipidemia)   . HTN (hypertension)   . Pacemaker     autonomic dysfunction  . Complication of anesthesia     pt states wakes up with "shakes"  . PONV (postoperative nausea and vomiting)   . Stroke     left weaker   . History of radiation therapy 10/24/12    brain  . Skin cancer     Hx: of lung lesion  . Family history of anesthesia complication     PONV  . Asthma     Past Surgical History  Procedure Laterality Date  . Insert / replace / remove pacemaker  2012    1999, x 3  . Ablation saphenous vein w/ rfa      2002  . Tonsillectomy and adenoidectomy    . Carpal tunnel release      left  . Nose surgery  2010, 2012    for nose bleeds x 2  . Fatty tumor removed      from chest  . Melanoma  excision      with removal of lymph nodes, left shoulder  . Deep axillary sentinel node biopsy / excision      due to extensive Melanoma-right arm  . Laparoscopy  09/06/2011    Procedure: LAPAROSCOPY OPERATIVE;  Surgeon: Tresa Endo A. Ernestina Penna, MD;  Location: [facility] ORS;  Service: Gynecology;  Laterality: Left;  with Left Ovarian Cystectomy   . Craniotomy N/A 09/30/2012    suboccipital craniectomy    Family History  Problem Relation Age of Onset  . Heart disease Father   . Diabetes Maternal Grandmother   . Diabetes Paternal Grandmother   . Stroke Mother   . Colon polyps Mother   . Clotting disorder Maternal Grandmother     stroke  . Crohn's disease Maternal Grandmother   . Diabetes Father   . Kidney disease Father    Social History:  reports that she has never smoked. She has never used smokeless tobacco. She reports that she does not drink alcohol or use illicit drugs.  Allergies:  Allergies  Allergen Reactions  . Doxycycline Hives  . Erythromycin Hives  . Penicillins Hives  . Preparation H [Pramox-Pe-Glycerin-Petrolatum] Hives    Cannot use  Cream or  Suppositories   . Sulfonamide Derivatives Hives    Medications Prior to Admission  Medication Sig Dispense Refill  . acetaminophen (TYLENOL) 500 MG tablet Take 1,000 mg by mouth every 6 (six) hours as needed for pain.      Marland Kitchen Alum & Mag Hydroxide-Simeth (MAGIC MOUTHWASH W/LIDOCAINE) SOLN Take 15 mLs by mouth 4 (four) times daily as needed for mouth pain.      . clonazePAM (KLONOPIN) 0.5 MG tablet Take 1 tablet (0.5 mg total) by mouth 3 (three) times daily as needed for anxiety.  50 tablet  0  . cloNIDine (CATAPRES) 0.1 MG tablet Take 0.1 mg by mouth at bedtime.       . cyanocobalamin (,VITAMIN B-12,) 1000 MCG/ML injection Inject 1,000 mcg into the muscle every 30 (thirty) days.       Marland Kitchen dexamethasone (DECADRON) 4 MG tablet Take 4 mg by mouth 2 (two) times daily.       . furosemide (LASIX) 40 MG tablet TAKE ONE TABLET BY MOUTH ONE TIME  DAILY   30 tablet  0  . HYDROcodone-acetaminophen (NORCO/VICODIN) 5-325 MG per tablet Take 1-2 tablets by mouth every 6 (six) hours as needed.  30 tablet  0  . hydrocortisone (ANUSOL-HC) 25 MG suppository Place 25 mg rectally at bedtime as needed for hemorrhoids.      . hydrocortisone-pramoxine (ANALPRAM-HC) 2.5-1 % rectal cream Place 1 application rectally at bedtime as needed for hemorrhoids.      . metFORMIN (GLUCOPHAGE) 500 MG tablet Take 500 mg by mouth daily after breakfast.      . metoprolol tartrate (LOPRESSOR) 25 MG tablet Take 25 mg by mouth daily.      . ondansetron (ZOFRAN-ODT) 4 MG disintegrating tablet Take 4 mg by mouth daily as needed for nausea.      . polyethylene glycol powder (GLYCOLAX/MIRALAX) powder Stir 17GM in 8OZ of water or juice 1 to 2 times daily for constipation  1054 g  1  . potassium chloride SA (K-DUR,KLOR-CON) 20 MEQ tablet Take 20 mEq by mouth daily.       . ranitidine (ZANTAC) 150 MG tablet Take 150 mg by mouth 2 (two) times daily.      . rosuvastatin (CRESTOR) 5 MG tablet Take 1 tablet (5 mg total) by mouth daily.  90 tablet  3  . eszopiclone (LUNESTA) 2 MG TABS tablet Take 2 mg by mouth at bedtime as needed for sleep. Take immediately before bedtime      . fluorouracil (EFUDEX) 5 % cream Apply 1 application topically daily as needed (hemorrhoids).       . mesalamine (CANASA) 1000 MG suppository Place 1,000 mg rectally at bedtime as needed (hemorrhoids).         Results for orders placed during the hospital encounter of 02/09/13 (from the past 48 hour(s))  GLUCOSE, CAPILLARY     Status: Abnormal   Collection Time    02/09/13  6:02 AM      Result Value Range   Glucose-Capillary 117 (*) 70 - 99 mg/dL   No results found.  Review of Systems  Constitutional: Positive for malaise/fatigue.  HENT: Negative.   Eyes: Negative.   Respiratory: Negative.   Cardiovascular: Negative.   Genitourinary: Negative.   Musculoskeletal: Negative.   Skin: Negative.    Neurological: Positive for weakness.  Endo/Heme/Allergies: Negative.     Blood pressure 117/90, pulse 88, temperature 97.1 F (36.2 C), resp. rate 18, SpO2 100.00%. Physical Exam  Constitutional: She is oriented to person,  place, and time. She appears well-developed and well-nourished.  HENT:  Head: Normocephalic and atraumatic.  Eyes: EOM are normal. Pupils are equal, round, and reactive to light.  Neck: Normal range of motion. Neck supple.  Cardiovascular: Normal rate and regular rhythm.   Respiratory: Effort normal and breath sounds normal.  GI: Soft. Bowel sounds are normal.  Musculoskeletal: Normal range of motion.  Neurological: She is alert and oriented to person, place, and time.  Skin: Skin is warm and dry.  Psychiatric: She has a normal mood and affect. Her behavior is normal. Judgment and thought content normal.     Assessment/Plan Left parietal lesion on the medial aspect of the left hemisphere. She is being admitted to undergo stereotactic guided resection of left parietal lesion.  Siarah Deleo J 02/09/2013, 8:00 AM

## 2013-02-09 NOTE — Anesthesia Procedure Notes (Signed)
Procedures CVP: Timeout, sterile prep, drape, FBP R neck.  Trendelenburg position. Finder and trocar RIJ 1st pass with US guidance.  2 lumen placed over J wire. Biopatch and sterile dressing on.  Patient tolerated well.  VSS.  Sandford Craze, MD    Performed in OR

## 2013-02-09 NOTE — Progress Notes (Signed)
Subjective: Patient reports Motor function remains good. Patient is awake and alert no focal deficits  Objective: Vital signs in last 24 hours: Temp:  [97 F (36.1 C)-97.7 F (36.5 C)] 97.7 F (36.5 C) (12/15 1600) Pulse Rate:  [62-88] 62 (12/15 1600) Resp:  [10-19] 11 (12/15 1600) BP: (87-119)/(55-95) 105/64 mmHg (12/15 1600) SpO2:  [96 %-100 %] 96 % (12/15 1600) Arterial Line BP: (131-149)/(67-79) 147/79 mmHg (12/15 1430) Weight:  [73.9 kg (162 lb 14.7 oz)] 73.9 kg (162 lb 14.7 oz) (12/15 1301)  Intake/Output from previous day:   Intake/Output this shift: Total I/O In: 1780 [I.V.:1700; Other:80] Out: 2040 [Urine:1940; Blood:100]  Motor function intact no evidence of a drift. Incision is clean and dry  Lab Results: No results found for this basename: WBC, HGB, HCT, PLT,  in the last 72 hours BMET No results found for this basename: NA, K, CL, CO2, GLUCOSE, BUN, CREATININE, CALCIUM,  in the last 72 hours  Studies/Results: No results found.  Assessment/Plan: Stable postop  LOS: 0 days  Mobilize in am   [name] J 02/09/2013, 5:55 PM

## 2013-02-09 NOTE — Op Note (Signed)
Date of surgery: 02/09/2013 Preoperative diagnosis: Left parietal metastatic brain tumor Postoperative diagnosis: Left parietal metastatic brain tumor Procedure: Left parietal craniotomy using Stealth guidance and complete resection of metastatic cancer operating microscope microdissection technique. Surgeon: Barnett Abu Assistant: Daryl Eastern Anesthesia: Gen. endotracheal  Indications: [name] is a 57 year old individual is had significant history of metastatic melanoma to the cerebellum. This was resected several months ago. She underwent stereotactic radiosurgery for the tumor bed and a small left parietal lesion. Since that time the parietal lesion has increased in size dramatically. There is a cystic component. A Stealth CT was recently performed which demonstrates the exact localization. This is to be used to undergo craniotomy for resection of this lesion. She has been requiring increasing doses of Decadron in order to maintain control of her headaches and also stabilize her neurologic function.  Surgery: Patient was brought to the operating room supine on a stretcher after the smooth induction of general endotracheal anesthesia, she had a central line placed in addition to an arterial monitoring catheter and the Foley catheter. She was then placed in a 3 pin headrest and placed in the lounge in position. The head was in the supine position flexed slightly to expose the posterior parietal region. The Stealth localization system was then used to localize the area of the tumor and to choose an entry site with the closest trajectory to the tumor. When the entry site was chosen the scalp was shaved in the area of the chosen direction of the incision which went from the vertex of the scalp With the ear. The incision crossed the midline. The scalp was prepped with alcohol and DuraPrep and then draped in a sterile fashion. A linear incision was created and carried down to the cranium. The periosteum  was removed from the outer table of the cranium. Then the area of the craniotomy itself was drawn onto this call checked with the Stealth guidance. A singular burr hole was placed on the lateral aspect of the parietal chosen opening. A craniotome tool was then used to raise a rectangular craniotomy flap based on the sagittal sinus. The underlying dura was noted to be relaxed. Tack up sutures were placed around the perimetry of the craniotomy. The dura was then opened with a flap based medially on the sagittal sinus. The underlying cortex was easily dissected along with some large veins that approach to the sagittal sinus. Between the veins there was an aperture along the medial aspect of the faults that could be dissected. This led to the epicenter of the tumor. This could be identified by discoloration on the surface of the brain. A self-retaining for mom retractor was then placed into this region to maintain some traction on the brain. Then by dissecting the PA in this region we entered a cavity which was full of obviously necrotic tissue that had a brownish blood thick consistency. Samples of this material were sent for pathologic examination. The cavity was evacuated. The walls were then inspected using the operating microscope and microdissection technique to remove discolored tissue which had some blackish speculations suggesting melatonin. Biopsies of this tissue were also included in the specimen. The pia around this area was cauterized as the cavity was enlarged. Hydrodissection was performed at to remove all the abnormal tissue and create a glial border that appeared clear back to normal tissue. This was done in a piecemeal fashion working anteriorly medially and then on the superior aspect of the cavity. In the end the abnormal tissue  was removed, and a glial cavity was obtained. The pia was carefully cauterized to maintain hemostasis. The cavity was irrigated several times to check for hemostasis and  look for any other remnants of abnormal tissue. When none were found the retractor was removed, and the dura was closed with 4-0 Nurolon in interrupted fashion. The bone plate was replaced with a burr hole cover in addition to 3 small straight plates. The scalp was then closed with 2-0 Vicryl in the galea and surgical staples in the skin blood loss for the procedure was estimated at approximately 100 cc. The specimen was sent for permanent pathologic evaluation.

## 2013-02-09 NOTE — Anesthesia Preprocedure Evaluation (Addendum)
Anesthesia Evaluation  Patient identified by MRN, date of birth, ID band Patient awake    Reviewed: Allergy & Precautions, H&P , NPO status , Patient's Chart, lab work & pertinent test results, reviewed documented beta blocker date and time   History of Anesthesia Complications (+) PONV and history of anesthetic complications  Airway Mallampati: II TM Distance: >3 FB Neck ROM: Full    Dental  (+) Teeth Intact and Dental Advisory Given   Pulmonary asthma ,  breath sounds clear to auscultation  Pulmonary exam normal       Cardiovascular hypertension, Pt. on medications and Pt. on home beta blockers - angina+ Peripheral Vascular Disease + dysrhythmias Atrial Fibrillation + pacemaker Rhythm:Regular Rate:Normal  10/14 ECHO: EF 60-65%, grade 2 diastolic dysfunction, valves OK   Neuro/Psych PSYCHIATRIC DISORDERS Anxiety CVA (slight R weakness), Residual Symptoms    GI/Hepatic Neg liver ROS, GERD-  Poorly Controlled,  Endo/Other  diabetes (glu 117), Well Controlled, Type 2, Oral Hypoglycemic Agents  Renal/GU negative Renal ROS     Musculoskeletal   Abdominal   Peds  Hematology   Anesthesia Other Findings   Reproductive/Obstetrics                         Anesthesia Physical Anesthesia Plan  ASA: III  Anesthesia Plan: General   Post-op Pain Management:    Induction: Intravenous  Airway Management Planned: Oral ETT  Additional Equipment: Arterial line  Intra-op Plan:   Post-operative Plan: Extubation in OR  Informed Consent: I have reviewed the patients History and Physical, chart, labs and discussed the procedure including the risks, benefits and alternatives for the proposed anesthesia with the patient or authorized representative who has indicated his/her understanding and acceptance.   Dental advisory given  Plan Discussed with:   Anesthesia Plan Comments: (Plan routine monitors, A-line,  GETA)        Anesthesia Quick Evaluation

## 2013-02-09 NOTE — Anesthesia Postprocedure Evaluation (Signed)
  Anesthesia Post-op Note  Patient: [name]  Procedure(s) Performed: Procedure(s) with comments: LEFT PARIETAL CRANIOTOMY with stealth (Left) - LEFT Parietal Craniotomy for tumor with stealth  Patient Location: PACU  Anesthesia Type:General  Level of Consciousness: awake, alert , oriented and patient cooperative  Airway and Oxygen Therapy: Patient Spontanous Breathing and Patient connected to nasal cannula oxygen  Post-op Pain: none  Post-op Assessment: Post-op Vital signs reviewed, Patient's Cardiovascular Status Stable, Respiratory Function Stable, Patent Airway, No signs of Nausea or vomiting and Pain level controlled  Post-op Vital Signs: Reviewed and stable  Complications: No apparent anesthesia complications

## 2013-02-09 NOTE — Transfer of Care (Signed)
Immediate Anesthesia Transfer of Care Note  Patient: [name]  Procedure(s) Performed: Procedure(s) with comments: LEFT PARIETAL CRANIOTOMY with stealth (Left) - LEFT Parietal Craniotomy for tumor with stealth  Patient Location: PACU  Anesthesia Type:General  Level of Consciousness: awake, alert , oriented and patient cooperative  Airway & Oxygen Therapy: Patient Spontanous Breathing and Patient connected to nasal cannula oxygen  Post-op Assessment: Report given to PACU RN, Post -op Vital signs reviewed and stable, Patient moving all extremities and Patient able to stick tongue midline  Post vital signs: Reviewed and stable  Complications: No apparent anesthesia complications

## 2013-02-10 ENCOUNTER — Encounter (HOSPITAL_COMMUNITY): Payer: Self-pay | Admitting: Neurological Surgery

## 2013-02-10 LAB — BASIC METABOLIC PANEL
BUN: 15 mg/dL (ref 6–23)
CO2: 27 mEq/L (ref 19–32)
Calcium: 9.4 mg/dL (ref 8.4–10.5)
Chloride: 102 mEq/L (ref 96–112)
Creatinine, Ser: 0.58 mg/dL (ref 0.50–1.10)
GFR calc Af Amer: >90 mL/min (ref >90)
GFR calc non Af Amer: >90 mL/min (ref >90)
Glucose, Bld: 148 mg/dL — ABNORMAL HIGH (ref 70–99)
Potassium: 4 mEq/L (ref 3.5–5.1)
Sodium: 140 mEq/L (ref 135–145)

## 2013-02-10 LAB — TYPE AND SCREEN
ABO/RH(D): O POS
Antibody Screen: NEGATIVE
Unit division: 0
Unit division: 0

## 2013-02-10 LAB — CBC
HCT: 38.2 % (ref 36.0–46.0)
Hemoglobin: 12.8 g/dL (ref 12.0–15.0)
MCH: 31.8 pg (ref 26.0–34.0)
MCHC: 33.5 g/dL (ref 30.0–36.0)
MCV: 95 fL (ref 78.0–100.0)
Platelets: 242 10*3/uL (ref 150–400)
RBC: 4.02 MIL/uL (ref 3.87–5.11)
RDW: 14.1 % (ref 11.5–15.5)
WBC: 14.7 10*3/uL — ABNORMAL HIGH (ref 4.0–10.5)

## 2013-02-10 MED ORDER — ALUM & MAG HYDROXIDE-SIMETH 200-200-20 MG/5ML PO SUSP
30.0000 mL | ORAL | Status: DC | PRN
  Administered 2013-02-10 (×2): 30 mL via ORAL
  Filled 2013-02-10 (×3): qty 30

## 2013-02-10 MED ORDER — DEXAMETHASONE 2 MG PO TABS
2.0000 mg | ORAL_TABLET | Freq: Two times a day (BID) | ORAL | Status: DC
  Administered 2013-02-10 – 2013-02-11 (×4): 2 mg via ORAL
  Filled 2013-02-10 (×5): qty 1

## 2013-02-10 MED ORDER — PANTOPRAZOLE SODIUM 40 MG PO TBEC
40.0000 mg | DELAYED_RELEASE_TABLET | Freq: Every day | ORAL | Status: DC
  Administered 2013-02-10 – 2013-02-11 (×2): 40 mg via ORAL
  Filled 2013-02-10: qty 1

## 2013-02-10 MED ORDER — LEVETIRACETAM 500 MG PO TABS
500.0000 mg | ORAL_TABLET | Freq: Two times a day (BID) | ORAL | Status: DC
  Administered 2013-02-10 – 2013-02-11 (×3): 500 mg via ORAL
  Filled 2013-02-10 (×3): qty 1

## 2013-02-10 NOTE — Progress Notes (Signed)
Patient ID: [name], female   DOB: [birth year], 57 y.o.   MRN: [id number] Signs are stable neurologically intact patient feels well no evidence of a drift incision is clean and dry. Transfer to floor today. PT evaluation for ambulate she appear. We'll rapidly taper Decadron. Await path results.

## 2013-02-11 MED ORDER — DEXAMETHASONE 0.75 MG PO TABS: ORAL_TABLET | ORAL | Status: DC

## 2013-02-11 MED ORDER — LEVETIRACETAM 500 MG PO TABS: 500.0000 mg | ORAL_TABLET | Freq: Two times a day (BID) | ORAL | Status: DC

## 2013-02-11 MED ORDER — HYDROCODONE-ACETAMINOPHEN 5-325 MG PO TABS: 1.0000 | ORAL_TABLET | Freq: Four times a day (QID) | ORAL | Status: DC | PRN

## 2013-02-11 NOTE — Evaluation (Signed)
Physical Therapy Evaluation Patient Details Name: [name] MRN: [id number] DOB: [birth year] Today's Date: 02/11/2013 Time: 4098-1191 PT Time Calculation (min): 29 min  PT Assessment / Plan / Recommendation History of Present Illness  Patient is a 57 year old right-handed individual who has had significant metastatic melanoma resected several months ago from the suboccipital region showed a small lesion in left parietal lobe she had stereotactic radiation and the lesion has increased in size dramatically. She is now s/p resection of this lesion using Stealth guidance  Clinical Impression  Patient demonstrates deficits in functional mobility as indicated below. Patient is independent for ambulation but does present with functional LE weakness and balance deficits. At this time feel patient will benefit from HHPT upon discharge to ensure safety with mobility in the home (bed/bath upstairs) as well as provide balance training.  Will defer further acute PT at this time. Patient in agreement. Rec dc home with HHPT.    PT Assessment  All further PT needs can be met in the next venue of care    Follow Up Recommendations  Home health PT;Supervision - Intermittent    Does the patient have the potential to tolerate intense rehabilitation      Barriers to Discharge        Equipment Recommendations  None recommended by PT    Recommendations for Other Services     Frequency      Precautions / Restrictions     Pertinent Vitals/Pain 2/10 incisional pain (head)      Mobility  Bed Mobility Bed Mobility: Not assessed Transfers Transfers: Sit to Stand;Stand to Sit Sit to Stand: 7: Independent Stand to Sit: 7: Independent Ambulation/Gait Ambulation/Gait Assistance: 7: Independent Ambulation Distance (Feet): 440 Feet Assistive device: None Ambulation/Gait Assistance Details: instability noted with ambulation, patient able to self correct Gait Pattern: Step-through pattern;Narrow  base of support Gait velocity: decreased Stairs: Yes Stairs Assistance: 5: Supervision Stair Management Technique: One rail Right;Alternating pattern;Forwards Number of Stairs: 5    Exercises Other Exercises Other Exercises: tandem walking perfomed with wall support, [facility] handout provided for mini squats, long arc quads, functional chair squats and balance program.    PT Diagnosis: Abnormality of gait;Generalized weakness  PT Problem List: Decreased strength;Decreased balance PT Treatment Interventions:       PT Goals(Current goals can be found in the care plan section) Acute Rehab PT Goals Patient Stated Goal: to get stronger  PT Goal Formulation: With patient Time For Goal Achievement: 02/25/13  Visit Information  Last PT Received On: 02/11/13 Assistance Needed: +1 History of Present Illness: Patient is a 57 year old right-handed individual who has had significant metastatic melanoma resected several months ago from the suboccipital region showed a small lesion in left parietal lobe she had stereotactic radiation and the lesion has increased in size dramatically. She is now s/p resection of this lesion using Stealth guidance       Prior Functioning  Home Living Family/patient expects to be discharged to:: Private residence Living Arrangements: Spouse/significant other Available Help at Discharge: Family Type of Home: House Home Access: Stairs to enter Secretary/administrator of Steps: 1 Entrance Stairs-Rails: None Home Layout: Two level;Bed/bath upstairs Alternate Level Stairs-Number of Steps: 10 Alternate Level Stairs-Rails: Right Home Equipment: Cane - quad;Cane - single point Prior Function Level of Independence: Independent with assistive device(s) Dominant Hand: Right    Cognition  Cognition Arousal/Alertness: Awake/alert Behavior During Therapy: WFL for tasks assessed/performed Overall Cognitive Status: Within Functional Limits for tasks assessed     Extremity/Trunk  Assessment Upper Extremity Assessment Upper Extremity Assessment: Defer to OT evaluation Lower Extremity Assessment Lower Extremity Assessment: Generalized weakness (history of peripheral neuropathy )   Balance Balance Balance Assessed: Yes Standardized Balance Assessment Standardized Balance Assessment: Dynamic Gait Index Dynamic Gait Index Level Surface: Normal Change in Gait Speed: Mild Impairment Gait with Horizontal Head Turns: Normal Gait with Vertical Head Turns: Normal Gait and Pivot Turn: Mild Impairment Step Over Obstacle: Normal Step Around Obstacles: Mild Impairment Steps: Normal Total Score: 21 High Level Balance High Level Balance Activites: Side stepping;Backward walking;Direction changes;Turns;Sudden stops;Head turns High Level Balance Comments: balance deficits noted but patient able to correct  End of Session PT - End of Session Equipment Utilized During Treatment: Gait belt Activity Tolerance: Patient tolerated treatment well Patient left: in chair;with call bell/phone within reach;with family/visitor present Nurse Communication: Mobility status  GP     [name] 02/11/2013, 9:12 AM Charlotte Crumb, PT DPT  [phone]

## 2013-02-11 NOTE — Discharge Summary (Signed)
Physician Discharge Summary  Patient ID: [name] MRN: [id number] DOB/AGE: 07-10-1955 57 y.o.  Admit date: 02/09/2013 Discharge date: 02/11/2013  Admission Diagnoses: Metastatic melanoma left parietal region  Discharge Diagnoses: Prostatic melanoma left parietal region Active Problems:   Malignant melanoma, metastatic   Discharged Condition: fair  Hospital Course: Patient was admitted to undergo surgical resection of lesion that previously been irradiated and left parietal lobe. Surgery went well. Pathology revealed presence of melanoma in the biopsy specimens.  Consults: None  Significant Diagnostic Studies: None  Treatments: Left parietal craniotomy resection metastatic lesion  Discharge Exam: Blood pressure 116/82, pulse 85, temperature 97.4 F (36.3 C), temperature source Oral, resp. rate 18, height 5\' 3"  (1.6 m), weight 73.9 kg (162 lb 14.7 oz), SpO2 98.00%. Patient is alert oriented no evidence of a drift motor function is good station and gait are intact.  Disposition: 01-Home or Self Care  Discharge Orders   Future Appointments Provider Department Dept Phone   02/17/2013 10:15 AM Rana Snare, NP [facility] [phone]   Future Orders Complete By Expires   Call MD for:  persistant dizziness or light-headedness  As directed    Call MD for:  redness, tenderness, or signs of infection (pain, swelling, redness, odor or green/yellow discharge around incision site)  As directed    Call MD for:  severe uncontrolled pain  As directed    Call MD for:  temperature >100.4  As directed    Diet - low sodium heart healthy  As directed    Increase activity slowly  As directed        Medication List         acetaminophen 500 MG tablet  Commonly known as:  TYLENOL  Take 1,000 mg by mouth every 6 (six) hours as needed for pain.     clonazePAM 0.5 MG tablet  Commonly known as:  KLONOPIN  Take 1 tablet (0.5 mg total) by mouth 3  (three) times daily as needed for anxiety.     cloNIDine 0.1 MG tablet  Commonly known as:  CATAPRES  Take 0.1 mg by mouth at bedtime.     cyanocobalamin 1000 MCG/ML injection  Commonly known as:  (VITAMIN B-12)  Inject 1,000 mcg into the muscle every 30 (thirty) days.     dexamethasone 0.75 MG tablet  Commonly known as:  DECADRON  1 twice daily for 7 days then 1 daily for 7 days then 1 every other day until finished     eszopiclone 2 MG Tabs tablet  Commonly known as:  LUNESTA  Take 2 mg by mouth at bedtime as needed for sleep. Take immediately before bedtime     fluorouracil 5 % cream  Commonly known as:  EFUDEX  Apply 1 application topically daily as needed (hemorrhoids).     furosemide 40 MG tablet  Commonly known as:  LASIX  TAKE ONE TABLET BY MOUTH ONE TIME DAILY     HYDROcodone-acetaminophen 5-325 MG per tablet  Commonly known as:  NORCO/VICODIN  Take 1-2 tablets by mouth every 6 (six) hours as needed.     HYDROcodone-acetaminophen 5-325 MG per tablet  Commonly known as:  NORCO  Take 1 tablet by mouth every 6 (six) hours as needed for moderate pain.     hydrocortisone 25 MG suppository  Commonly known as:  ANUSOL-HC  Place 25 mg rectally at bedtime as needed for hemorrhoids.     hydrocortisone-pramoxine 2.5-1 % rectal cream  Commonly known  as:  [facility]  Place 1 application rectally at bedtime as needed for hemorrhoids.     levETIRAcetam 500 MG tablet  Commonly known as:  KEPPRA  Take 1 tablet (500 mg total) by mouth 2 (two) times daily.     magic mouthwash w/lidocaine Soln  Take 15 mLs by mouth 4 (four) times daily as needed for mouth pain.     mesalamine 1000 MG suppository  Commonly known as:  CANASA  Place 1,000 mg rectally at bedtime as needed (hemorrhoids).     metFORMIN 500 MG tablet  Commonly known as:  GLUCOPHAGE  Take 500 mg by mouth daily after breakfast.     metoprolol tartrate 25 MG tablet  Commonly known as:  LOPRESSOR  Take 25 mg by  mouth daily.     ondansetron 4 MG disintegrating tablet  Commonly known as:  ZOFRAN-ODT  Take 4 mg by mouth daily as needed for nausea.     polyethylene glycol powder powder  Commonly known as:  GLYCOLAX/MIRALAX  Stir 17GM in 8OZ of water or juice 1 to 2 times daily for constipation     potassium chloride SA 20 MEQ tablet  Commonly known as:  K-DUR,KLOR-CON  Take 20 mEq by mouth daily.     ranitidine 150 MG tablet  Commonly known as:  ZANTAC  Take 150 mg by mouth 2 (two) times daily.     rosuvastatin 5 MG tablet  Commonly known as:  CRESTOR  Take 1 tablet (5 mg total) by mouth daily.         SignedStefani Dama 02/11/2013, 6:59 PM

## 2013-02-11 NOTE — Evaluation (Addendum)
Occupational Therapy Evaluation Patient Details Name: [name] MRN: [id number] DOB: 08-19-1955 Today's Date: 02/11/2013 Time: 1010-1026 OT Time Calculation (min): 16 min  OT Assessment / Plan / Recommendation History of present illness Patient is a 57 year old right-handed individual who has had significant metastatic melanoma resected several months ago from the suboccipital region showed a small lesion in left parietal lobe she had stereotactic radiation and the lesion has increased in size dramatically. She is now s/p resection of this lesion using Stealth guidance   Clinical Impression   Pt moving well during evaluation. Education provided to pt and spouse. Feel pt is safe to d/c home with spouse with family available to assist.    OT Assessment  Patient does not need any further OT services    Follow Up Recommendations  No OT follow up;Supervision - Intermittent    Barriers to Discharge      Equipment Recommendations    None recommended by OT.    Recommendations for Other Services    Frequency       Precautions / Restrictions     Pertinent Vitals/Pain Pain 3/10 where staples are at.     ADL  Lower Body Dressing: Supervision/safety Where Assessed - Lower Body Dressing: Unsupported sit to stand Toilet Transfer: Supervision/safety Toilet Transfer Method: Sit to stand Toilet Transfer Equipment: Comfort height toilet Tub/Shower Transfer: Supervision/safety Tub/Shower Transfer Method: Ambulating Transfers/Ambulation Related to ADLs: Supervision for ambulation and shower transfer. Modified Independent/supervision for sit <> stand transfers ADL Comments: Educated to sit to bathe and also to sit to get LB clothing over feet. Educated to stand in front of bed/chair when pulling up LB clothing for safety. Recommended husband being with pt when bathing and getting in/out of shower.  Educated to have rugs picked up in house (non skid in bathroom) and educated on safe shoe  wear.  Educated on use of reacher as pt reports she has difficulty squatting down to wipe things off of floor. Educated that sit <> stand transfers are good for strengthening legs.    OT Diagnosis:    OT Problem List:   OT Treatment Interventions:     OT Goals(Current goals can be found in the care plan section) Acute Rehab OT Goals Patient Stated Goal: be independent  Visit Information  Last OT Received On: 02/11/13 Assistance Needed: +1 History of Present Illness: Patient is a 57 year old right-handed individual who has had significant metastatic melanoma resected several months ago from the suboccipital region showed a small lesion in left parietal lobe she had stereotactic radiation and the lesion has increased in size dramatically. She is now s/p resection of this lesion using Stealth guidance       Prior Functioning     Home Living Family/patient expects to be discharged to:: Private residence Living Arrangements: Spouse/significant other Available Help at Discharge: Family Type of Home: House Home Access: Stairs to enter Secretary/administrator of Steps: 1 Entrance Stairs-Rails: None Home Layout: Two level;Bed/bath upstairs Alternate Level Stairs-Number of Steps: 10 Alternate Level Stairs-Rails: Right Home Equipment: Cane - quad;Cane - single point;Shower seat Prior Function Level of Independence: Independent with assistive device(s) Communication Communication: No difficulties Dominant Hand: Right         Vision/Perception Vision - History Baseline Vision: Wears glasses all the time Visual History:  (pt reports she has wrong prescription of glasses) Patient Visual Report: No change from baseline   Cognition  Cognition Arousal/Alertness: Awake/alert Behavior During Therapy: WFL for tasks assessed/performed Overall Cognitive Status: Within Functional  Limits for tasks assessed (pt did have decreased attention during coordination testing)    Extremity/Trunk  Assessment Upper Extremity Assessment Upper Extremity Assessment: Overall WFL for tasks assessed Lower Extremity Assessment Lower Extremity Assessment: Defer to PT evaluation     Mobility Bed Mobility Bed Mobility: Not assessed Transfers Sit to Stand: From chair/3-in-1;From toilet;5: Supervision;6: Modified independent (Device/Increase time) Stand to Sit: To chair/3-in-1;To toilet;5: Supervision;6: Modified independent (Device/Increase time) Details for Transfer Assistance: Cues for technique for toilet transfer     Exercise     Balance     End of Session OT - End of Session Activity Tolerance: Patient tolerated treatment well Patient left: in chair;with call bell/phone within reach;with family/visitor present  GO     Earlie Raveling OTR/L [phone] 02/11/2013, 12:50 PM

## 2013-02-12 ENCOUNTER — Telehealth: Payer: Self-pay | Admitting: Internal Medicine

## 2013-02-12 ENCOUNTER — Other Ambulatory Visit: Payer: Self-pay | Admitting: *Deleted

## 2013-02-12 DIAGNOSIS — D496 Neoplasm of unspecified behavior of brain: Secondary | ICD-10-CM

## 2013-02-12 NOTE — Telephone Encounter (Signed)
Patient needs Head MRI asap. I am sending to [facility] to schedule, and forwarding to Dr. Ladona Ridgel nurse Tresa Endo) to follow up with scheduling this, as I am out of the office tomorrow. I will f/u Monday 12/22

## 2013-02-12 NOTE — Telephone Encounter (Signed)
New Message  Pt requests a call back as soon as possible ( No further details)

## 2013-02-12 NOTE — Telephone Encounter (Signed)
Pt recently d/c from hospital for another brain surgery - devastating news - more melanoma. Dr. Danielle Dess at [facility] is requesting another MRI.

## 2013-02-13 ENCOUNTER — Other Ambulatory Visit: Payer: Self-pay | Admitting: Radiation Therapy

## 2013-02-13 ENCOUNTER — Telehealth: Payer: Self-pay | Admitting: Internal Medicine

## 2013-02-13 ENCOUNTER — Telehealth: Payer: Self-pay | Admitting: *Deleted

## 2013-02-13 DIAGNOSIS — C7931 Secondary malignant neoplasm of brain: Secondary | ICD-10-CM

## 2013-02-13 LAB — GLUCOSE, CAPILLARY: Glucose-Capillary: 169 mg/dL — ABNORMAL HIGH (ref 70–99)

## 2013-02-13 NOTE — Telephone Encounter (Signed)
See telephone note from 02-13-13

## 2013-02-13 NOTE — Telephone Encounter (Signed)
Spoke with [name], discussed with dr Tenny Craw, we can cont to take care of the bp and hydration issues. She will call dr elsner regarding specific orders regarding her recent surgery.

## 2013-02-13 NOTE — Telephone Encounter (Signed)
Per Dr. Truett Perna: OK to reschedule office visit to after January. Pt being worked up with MRI next week. She reports she is off chemo for now. Will see Dr. Dan Europe after MRI. Pt understands office visit will rescheduled.

## 2013-02-13 NOTE — Telephone Encounter (Signed)
New problem    Need further home health orders S/p surgery . With Dr. Danielle Dess.

## 2013-02-13 NOTE — Telephone Encounter (Signed)
New Problem:  Autumn RN is calling to get verbal orders on the pt post brain surgery. Autumn is requesting a call back.

## 2013-02-13 NOTE — Addendum Note (Signed)
Addended by: Dennis Bast F on: 02/13/2013 09:56 AM   Modules accepted: Orders

## 2013-02-13 NOTE — Addendum Note (Signed)
Addended by: Dennis Bast F on: 02/13/2013 09:45 AM   Modules accepted: Orders

## 2013-02-14 ENCOUNTER — Telehealth: Payer: Self-pay | Admitting: Family Medicine

## 2013-02-14 ENCOUNTER — Other Ambulatory Visit: Payer: Self-pay | Admitting: Hematology & Oncology

## 2013-02-14 NOTE — Telephone Encounter (Signed)
Caller (Cassandra from [facility]) reports she is looking for whoever is covering call for Dr Dietrich Pates.  Informed pt that I was a family physician and Dr Tenny Craw is a cardiologist and she would need to call their service.  Caller frustrated and states- 'she's calling us saying she's having leg cramps and her electrolytes are messed up and I don't know what to do.  I'll just send her to the ER'.  I told her not to send pt to ER for leg cramps and I would try and help her.  According to labs from 12/16, pt's electrolytes are normal.  Pt has just been d/c'd from hospital after having mass removed from brain.  Told her that leg cramps are most commonly due to dehydration and pt should increase her fluid intake.  If cramps persist, they'll need to get in touch w/ either the surgeon or the oncologist.  Caller expressed understanding and was appreciative of the help.

## 2013-02-16 ENCOUNTER — Telehealth: Payer: Self-pay | Admitting: Oncology

## 2013-02-16 NOTE — Telephone Encounter (Signed)
R/s appt to january [id number] per Darl Pikes , nurse navigator she will notify pt

## 2013-02-17 ENCOUNTER — Other Ambulatory Visit: Payer: Self-pay | Admitting: Internal Medicine

## 2013-02-17 ENCOUNTER — Ambulatory Visit: Payer: BC Managed Care – PPO | Admitting: Nurse Practitioner

## 2013-02-20 ENCOUNTER — Telehealth: Payer: Self-pay | Admitting: *Deleted

## 2013-02-20 ENCOUNTER — Telehealth: Payer: Self-pay | Admitting: Nurse Practitioner

## 2013-02-20 ENCOUNTER — Telehealth: Payer: Self-pay | Admitting: Oncology

## 2013-02-20 ENCOUNTER — Ambulatory Visit (HOSPITAL_COMMUNITY)
Admission: RE | Admit: 2013-02-20 | Discharge: 2013-02-20 | Disposition: A | Discharge status: Home / Self Care | Payer: BC Managed Care – PPO | Source: Ambulatory Visit | Attending: Internal Medicine | Admitting: Internal Medicine

## 2013-02-20 DIAGNOSIS — Z09 Encounter for follow-up examination after completed treatment for conditions other than malignant neoplasm: Secondary | ICD-10-CM | POA: Insufficient documentation

## 2013-02-20 DIAGNOSIS — C7931 Secondary malignant neoplasm of brain: Secondary | ICD-10-CM | POA: Insufficient documentation

## 2013-02-20 DIAGNOSIS — D496 Neoplasm of unspecified behavior of brain: Secondary | ICD-10-CM

## 2013-02-20 DIAGNOSIS — C801 Malignant (primary) neoplasm, unspecified: Secondary | ICD-10-CM | POA: Insufficient documentation

## 2013-02-20 MED ORDER — GADOBENATE DIMEGLUMINE 529 MG/ML IV SOLN
15.0000 mL | Freq: Once | INTRAVENOUS | Status: AC
  Administered 2013-02-20: 15 mL via INTRAVENOUS

## 2013-02-20 NOTE — Telephone Encounter (Signed)
Per note from [facility] on 12/26 rs to [id number] at 1145 w [facility] shh

## 2013-02-20 NOTE — Telephone Encounter (Signed)
RS Jan appt again per [facility] and [facility] for pt at home phone number adv of same shh

## 2013-02-22 ENCOUNTER — Other Ambulatory Visit: Payer: Self-pay | Admitting: Internal Medicine

## 2013-02-24 ENCOUNTER — Ambulatory Visit: Payer: BC Managed Care – PPO | Admitting: Radiation Oncology

## 2013-02-25 ENCOUNTER — Ambulatory Visit
Admission: RE | Admit: 2013-02-25 | Discharge: 2013-02-25 | Disposition: A | Discharge status: Home / Self Care | Payer: BC Managed Care – PPO | Source: Ambulatory Visit | Attending: Radiation Oncology | Admitting: Radiation Oncology

## 2013-02-25 ENCOUNTER — Encounter: Payer: Self-pay | Admitting: Radiation Oncology

## 2013-02-25 VITALS — BP 110/60 | HR 68 | Temp 97.4°F | Resp 20 | Ht 63.0 in | Wt 168.3 lb

## 2013-02-25 DIAGNOSIS — C7931 Secondary malignant neoplasm of brain: Secondary | ICD-10-CM

## 2013-02-25 MED ORDER — FLUCONAZOLE 100 MG PO TABS: 100.0000 mg | ORAL_TABLET | Freq: Every day | ORAL | Status: DC

## 2013-02-25 NOTE — Progress Notes (Signed)
Radiation Oncology         [phone] ________________________________  Name: [name] MRN: [id number]  Date: 02/25/2013  DOB: 1955/11/27  Follow-Up Visit Note  CC: Lorretta Harp, MD  Fabian Sharp Neta Mends, MD  Diagnosis:   Metastatic melanoma  Interval Since Last Radiation: The patient completed stereotactic radiosurgery on 10/24/2012 to 2 intracranial lesions/sites.   Narrative:  The patient returns today for routine follow-up.  The patient proceeded to undergo section of a previously treated left parietal lesion on 02/09/2013. The pathology returned positive for malignant melanoma. Neurosurgery felt that they were able to resect all visible disease. The patient has represented some complexity in obtaining MRI scans but she was able to undergo an MRI scan of the brain on 02/20/2013. No clear residual tumor was seen on this scan. One area in particular which was anterior and inferior to the surgical cavity was felt to warrant attention on further followup.  The patient underwent a CT scan of the chest abdomen and pelvis. A 1.5 cm right lower lobe nodule previously measured 1.0 cm. Additionally new hepatic lesions were seen which were worrisome for increasing metastatic disease. The patient has seen Dr. Dan Europe who has discussed these findings with the patient. The current plan is to proceed with [facility], and to hold all on any further radiation treatment at this time.  The patient states that she is doing fairly well overall. Her dominate complaint today is some ongoing reflux. This has been attributed to steroid use and she has increased her anti-reflux medications significantly. However this issue has been persistent. She is scheduled to continue further tapering her steroids over the next couple of weeks.                              ALLERGIES:  is allergic to doxycycline; erythromycin; penicillins; preparation h; and sulfonamide derivatives.  Meds: Current Outpatient  Prescriptions  Medication Sig Dispense Refill  . acetaminophen (TYLENOL) 500 MG tablet Take 1,000 mg by mouth every 6 (six) hours as needed for pain.      Marland Kitchen Alum & Mag Hydroxide-Simeth (MAGIC MOUTHWASH W/LIDOCAINE) SOLN Take 15 mLs by mouth 4 (four) times daily as needed for mouth pain.      . clonazePAM (KLONOPIN) 0.5 MG tablet Take 1 tablet (0.5 mg total) by mouth 3 (three) times daily as needed for anxiety.  50 tablet  0  . cloNIDine (CATAPRES) 0.1 MG tablet Take 0.1 mg by mouth at bedtime.       . cyanocobalamin (,VITAMIN B-12,) 1000 MCG/ML injection Inject 1,000 mcg into the muscle every 30 (thirty) days.       Marland Kitchen dexamethasone (DECADRON) 0.75 MG tablet 0.75 mg daily. 1 twice daily for 7 days then 1 daily for 7 days then 1 every other day until finished      . eszopiclone (LUNESTA) 2 MG TABS tablet Take 2 mg by mouth at bedtime as needed for sleep. Take immediately before bedtime      . fluorouracil (EFUDEX) 5 % cream Apply 1 application topically daily as needed (hemorrhoids).       . furosemide (LASIX) 40 MG tablet TAKE ONE TABLET BY MOUTH ONE TIME DAILY   30 tablet  5  . HYDROcodone-acetaminophen (NORCO) 5-325 MG per tablet Take 1 tablet by mouth every 6 (six) hours as needed for moderate pain.  30 tablet  0  . HYDROcodone-acetaminophen (NORCO/VICODIN) 5-325 MG per tablet Take 1-2  tablets by mouth every 6 (six) hours as needed.  30 tablet  0  . hydrocortisone (ANUSOL-HC) 25 MG suppository Place 25 mg rectally at bedtime as needed for hemorrhoids.      . hydrocortisone-pramoxine (ANALPRAM-HC) 2.5-1 % rectal cream Place 1 application rectally at bedtime as needed for hemorrhoids.      . levETIRAcetam (KEPPRA) 500 MG tablet Take 500 mg by mouth 2 (two) times daily.      . mesalamine (CANASA) 1000 MG suppository Place 1,000 mg rectally at bedtime as needed (hemorrhoids).       . metFORMIN (GLUCOPHAGE) 500 MG tablet Take 500 mg by mouth daily after breakfast.      . metoprolol tartrate  (LOPRESSOR) 25 MG tablet take 1 tablet twice daily  60 tablet  5  . ondansetron (ZOFRAN-ODT) 4 MG disintegrating tablet Take 4 mg by mouth daily as needed for nausea.      . polyethylene glycol powder (GLYCOLAX/MIRALAX) powder Stir 17 grams in 8 ounces of water or juice one to two times daily for constipation.  1054 g  0  . potassium chloride SA (K-DUR,KLOR-CON) 20 MEQ tablet Take 20 mEq by mouth daily.       . rosuvastatin (CRESTOR) 5 MG tablet Take 1 tablet (5 mg total) by mouth daily.  90 tablet  3  . fluconazole (DIFLUCAN) 100 MG tablet Take 1 tablet (100 mg total) by mouth daily. Take 2 tablets 1st day, then 1 tablet per day beginning on 2nd day, for a total of 10 days.  11 tablet  0  . ranitidine (ZANTAC) 150 MG tablet Take 150 mg by mouth 2 (two) times daily.       No current facility-administered medications for this encounter.    Physical Findings: The patient is in no acute distress. Patient is alert and oriented.  height is 5\' 3"  (1.6 m) and weight is 168 lb 4.8 oz (76.34 kg). Her oral temperature is 97.4 F (36.3 C). Her blood pressure is 110/60 and her pulse is 68. Her respiration is 20 and oxygen saturation is 100%. .   No thrush present.   Lab Findings: Lab Results  Component Value Date   WBC 14.7* 02/10/2013   HGB 12.8 02/10/2013   HCT 38.2 02/10/2013   MCV 95.0 02/10/2013   PLT 242 02/10/2013     Radiographic Findings: Ct Head W Contrast  02/05/2013   CLINICAL DATA:  Metastatic melanoma. Left cerebellar tumor resection. Stereotactic radiosurgery to left cerebellum and left parietal metastatic deposits. Preop evaluation for resection of left parietal lesion. Question radionecrosis versus tumor  EXAM: CT HEAD WITH CONTRAST  TECHNIQUE: Contiguous axial images were obtained from the base of the skull through the vertex with intravenous contrast.  CONTRAST:  OMNIPAQUE IOHEXOL 300 MG/ML  SOLN  COMPARISON:  CT head 01/20/2013.  MRI 10/22/2012  FINDINGS: Postcontrast  imaging only was obtained. Unenhanced images not obtained today.  Cystic mass with mural nodule in the left parietal lobe medially. The mural nodule measures approximately 10 x 15 mm and is hyperdense on precontrast imaging on the prior study. This is similar today. The cyst currently measures 22 x 30 mm and is essentially unchanged from the prior CT. Minimal surrounding white matter edema.  Ventricle size is normal.  No shift of the midline structures.  Postsurgical encephalomalacia left inferior cerebellar hemisphere unchanged.  No new lesions are identified.  IMPRESSION: Cystic mass left medial parietal lobe with mural nodule unchanged from the recent study.  Stable postop resection of left cerebellar lesion. No definite recurrent tumor in the left cerebellum.   Electronically Signed   By: Marlan Palau M.D.   On: 02/05/2013 16:48   Mr Laqueta Jean YQ Contrast  02/20/2013   ADDENDUM REPORT: 02/20/2013 12:24  ADDENDUM: 6 mm hyperintensity left lateral splenium of the corpus callosum was not present on the prior MRI of 10/22/2012. This is anterior and inferior to the surgical cavity and could be a postsurgical finding. This does not enhance. Attention on follow-up MRI suggested.   Electronically Signed   By: Marlan Palau M.D.   On: 02/20/2013 12:24   02/20/2013   CLINICAL DATA:  Metastatic melanoma. Craniotomy 02/09/2013 with resection of left parietal metastatic disease.  The patient has a Biotronik pacemaker and has had previous MRI with this pacemaker. The patient was monitored and supervised by Dr. Sharrell Ku, and Kizzie Ide, NP, during the MRI. No complications were encountered .  EXAM: MRI HEAD WITHOUT AND WITH CONTRAST  TECHNIQUE: Multiplanar, multiecho pulse sequences of the brain and surrounding structures were obtained without and with intravenous contrast.  CONTRAST:  15mL MULTIHANCE GADOBENATE DIMEGLUMINE 529 MG/ML IV SOLN  COMPARISON:  CT 02/05/2013.  MRI 10/22/2012.  FINDINGS: Recent left  parietal craniotomy for resection of tumor in the left parietal lobe. Pathology was metastatic melanoma. Surgical cavity measures 20 x 17 mm. There is layering blood in the cavity. There is also methemoglobin mixed with the fluid in the cavity causing T1 shortening on precontrast images. This is best seen on the sagittal T1 sequence. Axial T1 images prior to contrast showed less pronounced T1 shortening which is felt to be due to scanning parameters. Postcontrast imaging shows no definite to residual tumor. Expected postop dural enhancement. This will serve as a baseline examination for this patient.  Post surgical changes left inferior cerebellum for tumor section. No recurrent tumor in the left cerebellum.  No new areas of tumor identified.  Ventricle size is normal. No midline shift. No acute infarct is identified.  IMPRESSION: Surgical resection of left parietal metastatic deposit. Surgical cavity containing blood products. No residual tumor identified. This will serve as a baseline exam.  Postsurgical resection of tumor in the left inferior cerebellum without recurrent tumor in this area.  Electronically Signed: By: Marlan Palau M.D. On: 02/20/2013 11:48    Impression:    The patient is status post resection of a left parietal metastatic deposit which did represent metastatic melanoma. This lesion was previously treated with radiosurgery. No definitive evidence for residual disease at this time postoperatively. Increasing right lung nodule and findings suggestive of progressive hepatic metastases seen on recent CT scans of the chest abdomen and pelvis. 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  We discussed holding off on radiotherapy at this time.  The patient is at some risk for local failure but I believe that she can be followed in this regard and we will schedule her for a repeat MRI scan of the brain in 3 months. Given her systemic findings and possible effect within the CNS, she will  proceed with [facility].  Plan:  MRI scan of the brain in 3 months, then followup appointment. For the patient's ongoing reflux, I have given her a prescription for Diflucan given the possibility of esophageal candidiasis.  Further tapering off of steroids should also be beneficial.  I spent 20 minutes with the patient today, the majority of which was spent counseling the patient on the diagnosis  of cancer and coordinating care.   Radene Gunning, M.D., Ph.D.

## 2013-02-25 NOTE — Progress Notes (Signed)
Follow up  MRI brain,  no pain, 14, nauseated this am,  Threw up this am, having reflux, went to [facility] yesterday had CT 02/24/13 showed melanoma mets to liver, Dr. Carolin Coy, will start [name] Tm next Tuesday, IV q 90 min  Day 1, q 21 days for 4 cycles, took  Zantac and mylanta last night hasn't hel;ped the acid reflux, asking what else she can take,  Tingling in hands/feet and face, incision on occipital head well healed, some alopecia that area

## 2013-02-25 NOTE — Progress Notes (Signed)
EXAM: CT Chest without contrast   STUDY DATE: 02/24/13 10:47:39  TECHNIQUE: Serial 5mm axial CT images of the chest from the thoracic inlet through the hemidiaphragms were obtained without IV contrast. Coronal MIP images of the chest were also provided for further evaluation of the lung parenchyma.   CPT: 71250  INDICATION/HISTORY: Patient is a 58 year-old F. 172.9 - Melanoma of skin, site unspecified, ,    COMPARISON:12/01/12, 09/22/12  FINDINGS:  A 1.5 cm right lower lobe nodule on image 33 previously measured 1.0 cm. There are no new nodules. No mediastinal or hilar lymphadenopathy.  Hiatal hernia again noted.  There are 2 small regions of nodularity within the trachea on image 7 are without gross change from priors. Airway is patent.  A pacemaker is in place with one lead in the region of the right atrial appendage and second lead in the right ventricular apex.  Noncontrast images of the upper abdomen reveal new hepatic lesions. These include a 1.5 cm lesion in segment IVA and a 1.6 cm lesion in hepatic segment II on image 48, and a 1.7 cm low attenuation lesion in hepatic segment VI on image 55.  No subcutaneous nodules.  No suspicious osseous lesions.    INTERPRETATION LOCATION:  [facility]  IMPRESSION:  1. New hepatic lesions and increase in right lower lobe nodule are worrisome for increasing metastatic disease.  This was discussed with Dr. Dan Europe at 1:15 pm on 02/24/13 by Dr. Clyde Canterbury  2. Stable tiny tracheal nodules, indeterminate. Attention on followup.

## 2013-03-02 ENCOUNTER — Ambulatory Visit: Payer: BC Managed Care – PPO | Admitting: Nurse Practitioner

## 2013-03-03 ENCOUNTER — Telehealth: Payer: Self-pay | Admitting: Oncology

## 2013-03-03 NOTE — Telephone Encounter (Signed)
Pt called to r/s 1/8 appt to 1/15. Per pt she is having tx @ [facility] 1/13 and wants to wait and do the LT/BS appt after the tx. Pt has new appt d/t for 1/15 @ 11:45am and is aware she is on LT's schedule.

## 2013-03-04 ENCOUNTER — Encounter: Payer: Self-pay | Admitting: Radiation Oncology

## 2013-03-04 ENCOUNTER — Other Ambulatory Visit: Payer: Self-pay | Admitting: Radiation Oncology

## 2013-03-04 ENCOUNTER — Telehealth: Payer: Self-pay | Admitting: *Deleted

## 2013-03-04 MED ORDER — ESOMEPRAZOLE MAGNESIUM 40 MG PO CPDR: 40.0000 mg | DELAYED_RELEASE_CAPSULE | Freq: Two times a day (BID) | ORAL | Status: DC

## 2013-03-04 NOTE — Telephone Encounter (Signed)
Returned call to patient, she is taking zantac  TID and still isn't helping, she threw up bile last night 3x, decadron will be completed tomorrow,  She is most concerned the tumors in her liver is causing the acid reflux, hasn't slept well past 2 nights Wanting a rx for antacid, informed her I will call her back before I leave this afternoon after I speak with Dr.moody,patient waiting on call back 2:48 PM

## 2013-03-05 ENCOUNTER — Ambulatory Visit: Payer: BC Managed Care – PPO | Admitting: Nurse Practitioner

## 2013-03-05 ENCOUNTER — Ambulatory Visit: Payer: BC Managed Care – PPO | Admitting: Oncology

## 2013-03-05 NOTE — Progress Notes (Signed)
The patient called our office complaining of ongoing reflux. I had given the patient a prescription for Diflucan to treat possible esophageal candidiasis. She states that the reflux symptoms have continued however unchanged. She now is taking Zantac 3 times a day. She is scheduled to complete her steroids tomorrow.  I discussed with the patient again that tapering off of the steroids would hopefully be helpful. She is near completing this process but we discussed that this may not be an immediate improvement. the patient will continue and finish her prescription for Diflucan with her having only a couple of days remaining on this. We also changed her to a proton pump inhibitor although she states that this was not very effective earlier. We have increased the dose to twice a day versus one today previously for Nexium. The patient reportedly has a history of a hiatal hernia and has seen GI medicine before. She may need a referral if reflux continues after tapering off steroids.

## 2013-03-11 ENCOUNTER — Encounter: Payer: Self-pay | Admitting: Radiation Therapy

## 2013-03-11 NOTE — Progress Notes (Signed)
I called [name] to see how she was doing after starting the Nexium for her heartburn. She says that she is doing much better. She can rest again at night and the acid reflux/ vomiting has stopped as well. [name] had her first dose of [name] yesterday without complications or side effects.   [name]

## 2013-03-12 ENCOUNTER — Ambulatory Visit (HOSPITAL_BASED_OUTPATIENT_CLINIC_OR_DEPARTMENT_OTHER): Payer: BC Managed Care – PPO | Admitting: Nurse Practitioner

## 2013-03-12 ENCOUNTER — Telehealth: Payer: Self-pay | Admitting: Oncology

## 2013-03-12 VITALS — BP 107/70 | HR 86 | Temp 98.2°F | Resp 18 | Ht 63.0 in | Wt 171.3 lb

## 2013-03-12 DIAGNOSIS — C4360 Malignant melanoma of unspecified upper limb, including shoulder: Secondary | ICD-10-CM

## 2013-03-12 DIAGNOSIS — R21 Rash and other nonspecific skin eruption: Secondary | ICD-10-CM

## 2013-03-12 DIAGNOSIS — C7931 Secondary malignant neoplasm of brain: Secondary | ICD-10-CM

## 2013-03-12 DIAGNOSIS — C439 Malignant melanoma of skin, unspecified: Secondary | ICD-10-CM

## 2013-03-12 DIAGNOSIS — C7949 Secondary malignant neoplasm of other parts of nervous system: Secondary | ICD-10-CM

## 2013-03-12 DIAGNOSIS — R5381 Other malaise: Secondary | ICD-10-CM

## 2013-03-12 DIAGNOSIS — Z86718 Personal history of other venous thrombosis and embolism: Secondary | ICD-10-CM

## 2013-03-12 DIAGNOSIS — I10 Essential (primary) hypertension: Secondary | ICD-10-CM

## 2013-03-12 DIAGNOSIS — C799 Secondary malignant neoplasm of unspecified site: Secondary | ICD-10-CM

## 2013-03-12 DIAGNOSIS — C787 Secondary malignant neoplasm of liver and intrahepatic bile duct: Secondary | ICD-10-CM

## 2013-03-12 DIAGNOSIS — R5383 Other fatigue: Secondary | ICD-10-CM

## 2013-03-12 NOTE — Progress Notes (Addendum)
OFFICE PROGRESS NOTE  Interval history:  [name] returns for followup of metastatic melanoma. She underwent left parietal craniotomy for resection of left parietal metastatic brain tumor on 02/09/2013 by Dr. Ellene Route. Pathology confirmed metastatic malignant melanoma.  She reports recent restaging CT evaluation at [facility] showed evidence of liver metastases. She began treatment with ipilimumab 03/10/2013. She noted significant fatigue. She had mild nausea. No vomiting. She denies diarrhea. She notes a slight rash in the left axilla. None elsewhere. She has periodic headaches relieved with Tylenol. She continues to note leg weakness. She is participating in physical therapy. She completed a Decadron taper last week.   Objective: Filed Vitals:   03/12/13 1139  BP: 107/70  Pulse: 86  Temp: 98.2 F (36.8 C)  Resp: 18   Pupils equal round and reactive to light. Sclera anicteric. No thrush. Lungs clear. Regular cardiac rhythm. Abdomen soft and nontender. No hepatomegaly. No leg edema. Minimal decrease in proximal leg strength. Alert and oriented. Erythematous macular appearing rash left axilla.   Lab Results: Lab Results  Component Value Date   WBC 14.7* 02/10/2013   HGB 12.8 02/10/2013   HCT 38.2 02/10/2013   MCV 95.0 02/10/2013   PLT 242 02/10/2013   NEUTROABS 4.7 01/09/2013    Chemistry:    Chemistry      Component Value Date/Time   NA 140 02/10/2013 0530   NA 139 01/09/2013 1131   K 4.0 02/10/2013 0530   K 3.6 01/09/2013 1131   CL 102 02/10/2013 0530   CO2 27 02/10/2013 0530   CO2 25 01/09/2013 1131   BUN 15 02/10/2013 0530   BUN 18.2 01/09/2013 1131   CREATININE 0.58 02/10/2013 0530   CREATININE 0.8 01/09/2013 1131      Component Value Date/Time   CALCIUM 9.4 02/10/2013 0530   CALCIUM 9.6 01/09/2013 1131   ALKPHOS 106 01/09/2013 1131   ALKPHOS 84 11/06/2012 1139   AST 29 01/09/2013 1131   AST 79* 11/06/2012 1139   ALT 47 01/09/2013 1131   ALT 96* 11/06/2012 1139    BILITOT 0.38 01/09/2013 1131   BILITOT 0.4 11/06/2012 1139       Studies/Results: Mr Jeri Cos Wo Contrast  02/20/2013   ADDENDUM REPORT: 02/20/2013 12:24  ADDENDUM: 6 mm hyperintensity left lateral splenium of the corpus callosum was not present on the prior MRI of 10/22/2012. This is anterior and inferior to the surgical cavity and could be a postsurgical finding. This does not enhance. Attention on follow-up MRI suggested.   Electronically Signed   By: Franchot Gallo M.D.   On: 02/20/2013 12:24   02/20/2013   CLINICAL DATA:  Metastatic melanoma. Craniotomy 02/09/2013 with resection of left parietal metastatic disease.  The patient has a Biotronik pacemaker and has had previous MRI with this pacemaker. The patient was monitored and supervised by Dr. Crissie Sickles, and Carin Primrose, NP, during the MRI. No complications were encountered .  EXAM: MRI HEAD WITHOUT AND WITH CONTRAST  TECHNIQUE: Multiplanar, multiecho pulse sequences of the brain and surrounding structures were obtained without and with intravenous contrast.  CONTRAST:  42m MULTIHANCE GADOBENATE DIMEGLUMINE 529 MG/ML IV SOLN  COMPARISON:  CT 02/05/2013.  MRI 10/22/2012.  FINDINGS: Recent left parietal craniotomy for resection of tumor in the left parietal lobe. Pathology was metastatic melanoma. Surgical cavity measures 20 x 17 mm. There is layering blood in the cavity. There is also methemoglobin mixed with the fluid in the cavity causing T1 shortening on precontrast images. This is  best seen on the sagittal T1 sequence. Axial T1 images prior to contrast showed less pronounced T1 shortening which is felt to be due to scanning parameters. Postcontrast imaging shows no definite to residual tumor. Expected postop dural enhancement. This will serve as a baseline examination for this patient.  Post surgical changes left inferior cerebellum for tumor section. No recurrent tumor in the left cerebellum.  No new areas of tumor identified.  Ventricle size  is normal. No midline shift. No acute infarct is identified.  IMPRESSION: Surgical resection of left parietal metastatic deposit. Surgical cavity containing blood products. No residual tumor identified. This will serve as a baseline exam.  Postsurgical resection of tumor in the left inferior cerebellum without recurrent tumor in this area.  Electronically Signed: By: Franchot Gallo M.D. On: 02/20/2013 11:48    Medications: I have reviewed the patient's current medications.  Assessment/Plan: 1. Metastatic melanoma  -Status post resection of a right arm melanoma, stage II A (T2b N0) in December 2009  -Metastatic melanoma, status post resection of a left cerebellar metastasis 09/30/2012, BRAF mutation identified  -Status post SRS treatment of the left cerebellum and a left parietal metastasis 10/24/2012  -Staging CTs and PET scan without evidence of distant metastatic disease aside from indeterminate small lung nodules  -Restaging CTs 12/01/2012 consistent with enlargement of the left parietal mass and right lower lobe lung nodule  -Initiation of dabrafenib and tremetinib 12/17/2012.  She reports extremely poor tolerance. Discontinued. -Status post resection of a left parietal metastasis 02/09/2013. -Restaging CT evaluation 03/03/2013. Stable 1.5 cm right lower lobe pulmonary nodule. Multiple peripheral enhancing lesions throughout the liver. -Initiation of Ipilimumab at [facility] 03/10/2013. 2. Arrhythmia, pacemaker in place  3. Hypertension  4. History of a CVA  5. History of deep vein thrombosis  6. Proximal leg weakness-likely related to chronic steroid use. She is now off of steroids.  7. Intermittent rash at the posterior thighs and left hand-likely related to systemic therapy. 8. Rash left axilla. Question contact dermatitis, question eczema-like.  Dispositon-she appears stable. She is now on active treatment with Ipilimumab at [facility]. She has completed one treatment and is scheduled for cycle 2  on 03/31/2013. We will see her in followup here on 04/06/2013. She will contact the office prior to that visit with any problems.  Patient seen with Dr. Benay Spice.  25 minutes were spent face-to-face at today's visit with the majority that time involved in counseling/coordination of care.   Ned Card ANP/GNP-BC   This was a shared visit with Ned Card. The history is as noted above. She was unable to tolerate the dabrafenib and tremetinib. A restaging CT at [facility] confirmed progressive disease in the liver. She is now being treated with Ipilumumab. We discussed the potential toxicities associated with this agent. She plans to complete 4 cycles at [facility]. I told her we would be glad to give the immunotherapy here if she and Dr. Harriet Masson prefer. She will let us know.  Julieanne Manson, M.D.

## 2013-03-12 NOTE — Telephone Encounter (Signed)
pt did not want avs or appt sched....pt only wants to ck my chart.   appt made sed

## 2013-03-23 ENCOUNTER — Telehealth: Payer: Self-pay | Admitting: *Deleted

## 2013-03-23 NOTE — Telephone Encounter (Signed)
Patient had called Dr. Harriet Masson requesting CT brain. She suggests patient see Dr. Benay Spice and have scan. Left phone # for MD to call her to discuss.

## 2013-03-23 NOTE — Telephone Encounter (Signed)
[name] has called requesting CT scan brain be scheduled asap. Next [name] treatment is 04/01/13 and she is requesting to have scan done and know the results prior to this. Made her aware her request will be forwarded to Dr. Benay Spice.

## 2013-03-24 ENCOUNTER — Ambulatory Visit (HOSPITAL_BASED_OUTPATIENT_CLINIC_OR_DEPARTMENT_OTHER): Payer: BC Managed Care – PPO | Admitting: Oncology

## 2013-03-24 ENCOUNTER — Ambulatory Visit (HOSPITAL_COMMUNITY)
Admission: RE | Admit: 2013-03-24 | Discharge: 2013-03-24 | Disposition: A | Discharge status: Home / Self Care | Payer: BC Managed Care – PPO | Source: Ambulatory Visit | Attending: Oncology | Admitting: Oncology

## 2013-03-24 ENCOUNTER — Telehealth: Payer: Self-pay | Admitting: *Deleted

## 2013-03-24 ENCOUNTER — Telehealth: Payer: Self-pay | Admitting: Oncology

## 2013-03-24 VITALS — BP 125/79 | HR 84 | Temp 97.7°F | Resp 18 | Ht 63.0 in | Wt 173.1 lb

## 2013-03-24 DIAGNOSIS — C7931 Secondary malignant neoplasm of brain: Secondary | ICD-10-CM | POA: Insufficient documentation

## 2013-03-24 DIAGNOSIS — R51 Headache: Secondary | ICD-10-CM

## 2013-03-24 DIAGNOSIS — C4360 Malignant melanoma of unspecified upper limb, including shoulder: Secondary | ICD-10-CM

## 2013-03-24 DIAGNOSIS — C7949 Secondary malignant neoplasm of other parts of nervous system: Secondary | ICD-10-CM

## 2013-03-24 DIAGNOSIS — G9389 Other specified disorders of brain: Secondary | ICD-10-CM | POA: Insufficient documentation

## 2013-03-24 DIAGNOSIS — R110 Nausea: Secondary | ICD-10-CM | POA: Insufficient documentation

## 2013-03-24 DIAGNOSIS — C439 Malignant melanoma of skin, unspecified: Secondary | ICD-10-CM | POA: Insufficient documentation

## 2013-03-24 NOTE — Progress Notes (Signed)
   [place]    OFFICE PROGRESS NOTE   INTERVAL HISTORY:   She returns for an unscheduled visit. She reports a headache for the past 2 weeks. She reports a headache started approximately one day prior to seeing [name] 03/12/2013. The headache began when she discontinued Decadron. She reports nausea in the mornings that improves during the day. The nausea is relieved with Zofran. She complains of generalized weakness in the legs, especially when going up steps. She has noted increased lower leg and ankle edema bilaterally.  She was seen at [facility] 03/19/2013. The leg swelling was felt to be related to ipilumumab. The LDH was higher.  Objective:  Vital signs in last 24 hours:  Blood pressure 125/79, pulse 84, temperature 97.7 F (36.5 C), temperature source Oral, resp. rate 18, height _0  (1.6 m), weight 173 lb 1.6 oz (78.518 kg).    Resp: Lungs clear bilaterally Cardio: Regular rate and rhythm GI: No hepatomegaly, nontender Vascular: 1+ pitting edema at the low leg and ankle bilaterally Neuro: Alert and oriented, the motor exam appears intact in the upper and lower extremities. Finger to nose testing is normal. She ambulates with a normal gait. The speech is fluent.      Medications: I have reviewed the patient's current medications.  Assessment/Plan: 1. Metastatic melanoma  -Status post resection of a right arm melanoma, stage II A (T2b N0) in December 2009  -Metastatic melanoma, status post resection of a left cerebellar metastasis 09/30/2012, BRAF mutation identified  -Status post SRS treatment of the left cerebellum and a left parietal metastasis 10/24/2012  -Staging CTs and PET scan without evidence of distant metastatic disease aside from indeterminate small lung nodules  -Restaging CTs 12/01/2012 consistent with enlargement of the left parietal mass and right lower lobe lung nodule  -Initiation of dabrafenib and tremetinib 12/17/2012. She reports extremely poor  tolerance. Discontinued.  -Status post resection of a left parietal metastasis 02/09/2013.  -Restaging CT evaluation 03/03/2013. Stable 1.5 cm right lower lobe pulmonary nodule. Multiple peripheral enhancing lesions throughout the liver.  -Initiation of Ipilimumab at [facility] 03/10/2013.  2. Arrhythmia, pacemaker in place  3. Hypertension  4. History of a CVA  5. History of deep vein thrombosis  6. Proximal leg weakness-likely related to chronic steroid use. She is now off of steroids.  7. Intermittent rash at the posterior thighs and left hand-likely related to systemic therapy.  8. leg edema-? Related to liver disease,? Secondary to ipilumumab 9. Headache and mild nausea-coincides with discontinuation of Decadron   Disposition:  She presents for an unscheduled visit. She has developed a headache and mild nausea over the past 2 weeks. I discussed the case with Dr.Collichio. We will proceed with a noncontrast brain CT to look for evidence of significant edema or hemorrhage. She reports Tylenol relieves the headache and Zofran helps the nausea. She will not agree to further Decadron therapy.  She will return for a scheduled visit on 04/06/2013. She is scheduled for an appointment at [facility] next week.   Betsy Coder, MD  03/24/2013  3:36 PM

## 2013-03-24 NOTE — Telephone Encounter (Signed)
MD spoke with Dr. Harriet Masson. Will see patient today at 2:45pm. Patient agrees to come.

## 2013-03-24 NOTE — Telephone Encounter (Signed)
gv and printed appt sched and avs for pt  °

## 2013-03-26 ENCOUNTER — Telehealth: Payer: Self-pay | Admitting: *Deleted

## 2013-03-26 MED ORDER — POTASSIUM CHLORIDE CRYS ER 20 MEQ PO TBCR: 20.0000 meq | EXTENDED_RELEASE_TABLET | Freq: Two times a day (BID) | ORAL | Status: DC

## 2013-03-26 MED ORDER — FUROSEMIDE 40 MG PO TABS: 40.0000 mg | ORAL_TABLET | Freq: Every day | ORAL | Status: DC

## 2013-03-26 NOTE — Telephone Encounter (Signed)
Patient called complaining of swelling in the lower extremities and feet for several days. She saw her cancer doctor at [facility] recently and discussed increasing her Lasix dose from 20 to 40 but she advised that she call Dr.Ross for advice. Patient states that her last BMET recently was normal. Discussed above with Dr.Ross and she advised that the patient could increase Lasix to 40mg   every day and increase the KCL 53meq to BID on the days that the Lasix is at 40mg .She may go back on the previous dose if the larger dose is not needed and will then need a BMET next week if she stays on the higher dose of Lasix. Patient aware of above and will let us know her status so that we can call [facility] to order a BMET.

## 2013-03-27 ENCOUNTER — Other Ambulatory Visit: Payer: Self-pay | Admitting: Internal Medicine

## 2013-04-06 ENCOUNTER — Ambulatory Visit (HOSPITAL_BASED_OUTPATIENT_CLINIC_OR_DEPARTMENT_OTHER): Payer: BC Managed Care – PPO | Admitting: Oncology

## 2013-04-06 ENCOUNTER — Telehealth: Payer: Self-pay | Admitting: Oncology

## 2013-04-06 VITALS — BP 125/78 | HR 87 | Temp 97.8°F | Resp 18 | Ht 63.0 in | Wt 171.2 lb

## 2013-04-06 DIAGNOSIS — C4360 Malignant melanoma of unspecified upper limb, including shoulder: Secondary | ICD-10-CM

## 2013-04-06 DIAGNOSIS — C439 Malignant melanoma of skin, unspecified: Secondary | ICD-10-CM

## 2013-04-06 DIAGNOSIS — R609 Edema, unspecified: Secondary | ICD-10-CM

## 2013-04-06 DIAGNOSIS — C787 Secondary malignant neoplasm of liver and intrahepatic bile duct: Secondary | ICD-10-CM

## 2013-04-06 DIAGNOSIS — C7931 Secondary malignant neoplasm of brain: Secondary | ICD-10-CM

## 2013-04-06 DIAGNOSIS — C7949 Secondary malignant neoplasm of other parts of nervous system: Secondary | ICD-10-CM

## 2013-04-06 NOTE — Telephone Encounter (Signed)
Gave pt appt for MD on March 2015

## 2013-04-06 NOTE — Progress Notes (Signed)
   [place]    OFFICE PROGRESS NOTE   INTERVAL HISTORY:   She returns for scheduled followup of metastatic melanoma. She had a fever and malaise last week. The ipilumumab was held.  She reports constant nausea, relieved with Zofran. We saw her with a headache on 03/24/2013. A brain CT revealed no acute change.  Her chief complaint is of bilateral lower leg and ankle swelling.  Objective:  Vital signs in last 24 hours:  Blood pressure 125/78, pulse 87, temperature 97.8 F (36.6 C), temperature source Oral, resp. rate 18, height $RemoveBe'5\' 3"'pYWYrSbyY$  (1.6 m), weight 171 lb 3.2 oz (77.656 kg), SpO2 97.00%.    HEENT: No thrush Resp: Lungs clear bilaterally Cardio: Regular rate and rhythm GI: No hepatomegaly, nontender Vascular: Trace pitting edema at the low pretibial area and ankle bilaterally, no erythema Neuro: Alert and oriented, the motor exam appears intact in the upper and lower extremities  Skin: Few less than 1 cm flat erythematous areas over the anterior chest    Medications: I have reviewed the patient's current medications.  Assessment/Plan: 1.Metastatic melanoma  -Status post resection of a right arm melanoma, stage II A (T2b N0) in December 2009  -Metastatic melanoma, status post resection of a left cerebellar metastasis 09/30/2012, BRAF mutation identified  -Status post SRS treatment of the left cerebellum and a left parietal metastasis 10/24/2012  -Staging CTs and PET scan without evidence of distant metastatic disease aside from indeterminate small lung nodules  -Restaging CTs 12/01/2012 consistent with enlargement of the left parietal mass and right lower lobe lung nodule  -Initiation of dabrafenib and tremetinib 12/17/2012. She reports extremely poor tolerance. Discontinued.  -Status post resection of a left parietal metastasis 02/09/2013.  -Restaging CT evaluation 03/03/2013. Stable 1.5 cm right lower lobe pulmonary nodule. Multiple peripheral enhancing  lesions throughout the liver.  -Initiation of Ipilimumab at [facility] 03/10/2013.  2. Arrhythmia, pacemaker in place  3. Hypertension  4. History of a CVA  5. History of deep vein thrombosis  6. Proximal leg weakness-likely related to chronic steroid use. She is now off of steroids.  7. Intermittent rash at the posterior thighs and left hand-likely related to systemic therapy.  8. leg edema-? Related to liver disease,? Secondary to ipilumumab . Stable. 9. Headache and mild nausea-coincides with discontinuation of Decadron , CT of the brain 03/25/2011 with no acute change    Disposition:  She appears stable today. She will receive dose #2 of ipilumumab at [facility] on 04/07/2013. She will return for an office visit here 04/30/2013. Labs from [facility] 03/31/2013 were reviewed. I recommended she elevate the legs and try support stockings for the lower leg and foot edema.   Amanda Coder, MD  04/06/2013  1:51 PM

## 2013-04-16 ENCOUNTER — Ambulatory Visit (INDEPENDENT_AMBULATORY_CARE_PROVIDER_SITE_OTHER): Payer: BC Managed Care – PPO | Admitting: Internal Medicine

## 2013-04-16 ENCOUNTER — Encounter: Payer: Self-pay | Admitting: Internal Medicine

## 2013-04-16 VITALS — BP 100/70 | HR 80 | Ht 63.0 in | Wt 169.0 lb

## 2013-04-16 DIAGNOSIS — C439 Malignant melanoma of skin, unspecified: Secondary | ICD-10-CM

## 2013-04-16 DIAGNOSIS — I639 Cerebral infarction, unspecified: Secondary | ICD-10-CM

## 2013-04-16 DIAGNOSIS — R609 Edema, unspecified: Secondary | ICD-10-CM

## 2013-04-16 DIAGNOSIS — I471 Supraventricular tachycardia: Secondary | ICD-10-CM

## 2013-04-16 DIAGNOSIS — I498 Other specified cardiac arrhythmias: Secondary | ICD-10-CM

## 2013-04-16 DIAGNOSIS — I4719 Other supraventricular tachycardia: Secondary | ICD-10-CM

## 2013-04-16 DIAGNOSIS — G909 Disorder of the autonomic nervous system, unspecified: Secondary | ICD-10-CM

## 2013-04-16 DIAGNOSIS — G901 Familial dysautonomia [Riley-Day]: Secondary | ICD-10-CM

## 2013-04-16 DIAGNOSIS — I6350 Cerebral infarction due to unspecified occlusion or stenosis of unspecified cerebral artery: Secondary | ICD-10-CM

## 2013-04-16 NOTE — Progress Notes (Signed)
HPI Patient is a 58 yo with a history of autonomic dysfunction, atrial tachycardia including afib, CVA, diverticulosis, HL.   I follow her along with Olin Pia in cardiology her in [place].  She is also followed by B Grubb in [place]. She was last in cardiology clinic in Feb 2014. Since then she has been diagnosed with melanoma.  She has undergone 2 neurosurgical procedures.  Scan after this last procedure showed liver and lung lesions.  She is being followed at [place] (Dr Meliton Rattan) She comes in today for evaluation of edema After her first coarse of immunotherapy ( ( she developed severe peripheral edema.   She is on lasix and has gotten support socks.  She says that it has improved since a couple wks ago  She still tries to elevate her legs when not standing Her breathing is OK with daily activities  She does not exercise, tires too quickly Denies orthopnea or PND   Has occasional dizziness  One time in bathroom was bending down and fell back  Did not pass out. Has chronic nausea but tries to keep fluids down.   No CP    Allergies  Allergen Reactions  . Doxycycline Hives  . Erythromycin Hives  . Penicillins Hives  . Preparation H [Pramox-Pe-Glycerin-Petrolatum] Hives    Cannot use  Cream or Suppositories   . Sulfonamide Derivatives Hives    Current Outpatient Prescriptions  Medication Sig Dispense Refill  . acetaminophen (TYLENOL) 500 MG tablet Take 1,000 mg by mouth every 6 (six) hours as needed for pain.      Marland Kitchen albuterol (VENTOLIN HFA) 108 (90 BASE) MCG/ACT inhaler Inhale 2 puffs into the lungs as needed.      . Alum & Mag Hydroxide-Simeth (MAGIC MOUTHWASH W/LIDOCAINE) SOLN Take 15 mLs by mouth 4 (four) times daily as needed for mouth pain.      . cholecalciferol (VITAMIN D) 1000 UNITS tablet Take 1,000 Units by mouth daily.      . clonazePAM (KLONOPIN) 0.5 MG tablet Take 1 tablet (0.5 mg total) by mouth 3 (three) times daily as needed for anxiety.  50  tablet  0  . cloNIDine (CATAPRES) 0.1 MG tablet Take 0.1 mg by mouth at bedtime.       . cyanocobalamin (,VITAMIN B-12,) 1000 MCG/ML injection Inject 1,000 mcg into the muscle every 30 (thirty) days.       Marland Kitchen esomeprazole (NEXIUM) 40 MG capsule Take 1 capsule (40 mg total) by mouth 2 (two) times daily before a meal.  60 capsule  1  . eszopiclone (LUNESTA) 2 MG TABS tablet Take 2 mg by mouth at bedtime as needed for sleep. Take immediately before bedtime      . furosemide (LASIX) 40 MG tablet Take 40 mg by mouth 2 (two) times daily.      Marland Kitchen HYDROcodone-acetaminophen (NORCO) 5-325 MG per tablet Take 1 tablet by mouth every 6 (six) hours as needed for moderate pain.  30 tablet  0  . hydrocortisone (ANUSOL-HC) 25 MG suppository Place 25 mg rectally at bedtime as needed for hemorrhoids.      . hydrocortisone 2.5 % cream Apply topically 2 (two) times daily as needed.      . hydrocortisone-pramoxine (ANALPRAM-HC) 2.5-1 % rectal cream Place 1 application rectally at bedtime as needed for hemorrhoids.      . levETIRAcetam (KEPPRA) 500 MG tablet Take 500 mg by mouth 2 (two) times daily.      . mesalamine (  CANASA) 1000 MG suppository Place 1,000 mg rectally at bedtime as needed (hemorrhoids).       . metFORMIN (GLUCOPHAGE) 500 MG tablet Take 500 mg by mouth daily after breakfast.      . metoprolol tartrate (LOPRESSOR) 25 MG tablet take 1 tablet twice daily  60 tablet  5  . ondansetron (ZOFRAN) 8 MG tablet Take 8 mg by mouth every 8 (eight) hours as needed.      . ondansetron (ZOFRAN-ODT) 4 MG disintegrating tablet Take 4 mg by mouth every 8 (eight) hours as needed for nausea.       Marland Kitchen oxyCODONE (OXY IR/ROXICODONE) 5 MG immediate release tablet Take 5 mg by mouth.      . polyethylene glycol powder (GLYCOLAX/MIRALAX) powder Stir 17 grams in 8oz of water, or juice, 1 or 2 times daily for constipation   1054 g  2  . potassium chloride SA (K-DUR,KLOR-CON) 20 MEQ tablet Take 1 tablet (20 mEq total) by mouth 2 (two)  times daily.  60 tablet  11  . prochlorperazine (COMPAZINE) 10 MG tablet Take 10 mg by mouth every 6 (six) hours as needed.       . rosuvastatin (CRESTOR) 5 MG tablet Take 1 tablet (5 mg total) by mouth daily.  90 tablet  3  . fluorouracil (EFUDEX) 5 % cream Apply 1 application topically daily as needed (hemorrhoids).        No current facility-administered medications for this visit.    Past Medical History  Diagnosis Date  . 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 Hypoglycemia, unspecified   . Other nonspecific abnormal serum enzyme levels   . Cervicalgia   . Disturbance of skin sensation   . Other specified congenital anomalies of nervous system   . Swelling of limb   . Disorder of bone and cartilage, unspecified   . Atrial tachycardia   . CVA (cerebral infarction)     2012 with dizziness and vision change felt embolic from atrial tachy  . History of pacemaker   . POTS (postural orthostatic tachycardia syndrome)   . Diverticulosis   . Osteopenia   . DVT (deep venous thrombosis)   . Atrial fibrillation   . DM (diabetes mellitus)   . DVT (deep venous thrombosis)   . HLD (hyperlipidemia)   . HTN (hypertension)   . Pacemaker     autonomic dysfunction  . Complication of anesthesia     pt states wakes up with "shakes"  . PONV (postoperative nausea and vomiting)   . Stroke     left weaker   . History of radiation therapy 10/24/12    brain  . Skin cancer     Hx: of lung lesion  . Family history of anesthesia complication     PONV  . Asthma     Past Surgical History  Procedure Laterality Date  . Insert / replace / remove pacemaker  2012    1999, x 3  . Ablation saphenous vein w/ rfa      2002  . Tonsillectomy and adenoidectomy    . Carpal tunnel release      left  . Nose surgery  2010, 2012    for nose bleeds x 2  . Fatty tumor removed      from chest  . Melanoma excision      with removal of lymph nodes, left shoulder  . Deep axillary  sentinel node biopsy / excision  due to extensive Melanoma-right arm  . Laparoscopy  09/06/2011    Procedure: LAPAROSCOPY OPERATIVE;  Surgeon: Claiborne Billings A. Pamala Hurry, MD;  Location: [facility];  Service: Gynecology;  Laterality: Left;  with Left Ovarian Cystectomy   . Craniotomy N/A 09/30/2012    suboccipital craniectomy  . Craniotomy Left 02/09/2013    Procedure: LEFT PARIETAL CRANIOTOMY with stealth;  Surgeon: [name], MD;  Location: [facility];  Service: Neurosurgery;  Laterality: Left;  LEFT Parietal Craniotomy for tumor with stealth    Family History  Problem Relation Age of Onset  . Heart disease Father   . Diabetes Maternal Grandmother   . Diabetes Paternal Grandmother   . Stroke Mother   . Colon polyps Mother   . Clotting disorder Maternal Grandmother     stroke  . Crohn's disease Maternal Grandmother   . Diabetes Father   . Kidney disease Father     History   Social History  . Marital Status: Married    Spouse Name: N/A    Number of Children: 0  . Years of Education: N/A   Occupational History  . PRESIDENT     self employed   Social History Main Topics  . Smoking status: Never Smoker   . Smokeless tobacco: Never Used  . Alcohol Use: No     Comment: glass of wine daily  . Drug Use: No  . Sexual Activity: Not on file   Other Topics Concern  . Not on file   Social History Narrative  . No narrative on file    Review of Systems:  All systems reviewed.  They are negative to the above problem except as previously stated.  Vital Signs: BP 100/70  Pulse 80  Ht 5\' 3"  (1.6 m)  Wt 169 lb (76.658 kg)  BMI 29.94 kg/m2  Physical Exam Patient is in NAD HEENT:  Normocephalic, atraumatic. EOMI, PERRLA.  Neck: JVP is normal.  No bruits.  Lungs: clear to auscultation. No rales no wheezes.  Heart: Regular rate and rhythm. Normal S1, S2. No S3.   No significant murmurs. PMI not displaced.  Abdomen:  Supple, nontender. Normal bowel sounds. No masses. No  hepatomegaly.  Extremities:   Good distal pulses throughout.Tr lower extremity edema.  Musculoskeletal :moving all extremities.  Neuro:   alert and oriented x3.  CN II-XII grossly intact.   Assessment and Plan:  1.  Edema  This is most likely due to the immunotherapy.  Has gotten better since her first infusion.  I do not think this represents CHF.  QUick look echo today shows normal LV and RV size and function.  There is mild TR and estimated PAP is normal  Mitral inflow shows normal diastolic parameters for age.  No effusion.    I would continue medical therapy as doing  Taper lasix as tolerated.   I think she is OK to proceed with the next coarse of immunotherapy.  1.  Autonomic dysdfunction.Rel mild symptoms  Follow  Watch for dehydration.  2.  Atrial arrhythmias.  No signif spells. Biotronic pacer  3.  Neuro  CVA  No further spells  Off of anticoag with current Rx    4.  Melanoma  Has appt for infusion tomorrow  OK to proceed from cardiac standpoint  Will be available as needed for now.

## 2013-04-20 ENCOUNTER — Ambulatory Visit: Payer: BC Managed Care – PPO | Admitting: Internal Medicine

## 2013-04-29 ENCOUNTER — Other Ambulatory Visit: Payer: Self-pay | Admitting: Radiation Oncology

## 2013-04-30 ENCOUNTER — Ambulatory Visit (HOSPITAL_BASED_OUTPATIENT_CLINIC_OR_DEPARTMENT_OTHER): Payer: BC Managed Care – PPO | Admitting: Oncology

## 2013-04-30 ENCOUNTER — Telehealth: Payer: Self-pay | Admitting: Oncology

## 2013-04-30 VITALS — BP 129/85 | HR 79 | Temp 97.6°F | Resp 18 | Ht 63.0 in | Wt 165.3 lb

## 2013-04-30 DIAGNOSIS — K7689 Other specified diseases of liver: Secondary | ICD-10-CM

## 2013-04-30 DIAGNOSIS — R110 Nausea: Secondary | ICD-10-CM

## 2013-04-30 DIAGNOSIS — C4360 Malignant melanoma of unspecified upper limb, including shoulder: Secondary | ICD-10-CM

## 2013-04-30 DIAGNOSIS — Z86718 Personal history of other venous thrombosis and embolism: Secondary | ICD-10-CM

## 2013-04-30 DIAGNOSIS — R911 Solitary pulmonary nodule: Secondary | ICD-10-CM

## 2013-04-30 DIAGNOSIS — R29898 Other symptoms and signs involving the musculoskeletal system: Secondary | ICD-10-CM

## 2013-04-30 DIAGNOSIS — C7931 Secondary malignant neoplasm of brain: Secondary | ICD-10-CM

## 2013-04-30 DIAGNOSIS — C7949 Secondary malignant neoplasm of other parts of nervous system: Secondary | ICD-10-CM

## 2013-04-30 DIAGNOSIS — Z8673 Personal history of transient ischemic attack (TIA), and cerebral infarction without residual deficits: Secondary | ICD-10-CM

## 2013-04-30 DIAGNOSIS — L989 Disorder of the skin and subcutaneous tissue, unspecified: Secondary | ICD-10-CM

## 2013-04-30 DIAGNOSIS — C439 Malignant melanoma of skin, unspecified: Secondary | ICD-10-CM

## 2013-04-30 NOTE — Progress Notes (Signed)
   [place]    OFFICE PROGRESS NOTE   INTERVAL HISTORY:   She returns for scheduled followup of metastatic melanoma. The leg swelling has improved with support stockings. She continues furosemide. She completed a second cycle of ipilumumab at [facility] on 04/17/2013. She continues to have intermittent pustular skin lesions. No diarrhea. Her appetite has been poor. She was placed on prednisone earlier this week. She has weakness of the legs when climbing stairs.  Objective:  Vital signs in last 24 hours:  Blood pressure 129/85, pulse 79, temperature 97.6 F (36.4 C), temperature source Oral, resp. rate 18, height _0  (1.6 m), weight 165 lb 4.8 oz (74.98 kg), SpO2 100.00%.    HEENT: No thrush or ulcers Resp: Lungs clear bilaterally Cardio: Regular rate and rhythm GI: No hepatosplenomegaly, Vascular: No apparent edema of the legs, she is wearing support stockings Neuro: Alert and oriented, the motor exam appears intact in the upper and lower extremities  Skin: Few 1-2 mm erythematous lesions over the trunk     Lab Results:  Lab Results  Component Value Date   WBC 14.7* 02/10/2013   HGB 12.8 02/10/2013   HCT 38.2 02/10/2013   MCV 95.0 02/10/2013   PLT 242 02/10/2013   NEUTROABS 4.7 01/09/2013      Medications: I have reviewed the patient's current medications.  Assessment/Plan: 1.Metastatic melanoma  -Status post resection of a right arm melanoma, stage II A (T2b N0) in December 2009  -Metastatic melanoma, status post resection of a left cerebellar metastasis 09/30/2012, BRAF mutation identified  -Status post SRS treatment of the left cerebellum and a left parietal metastasis 10/24/2012  -Staging CTs and PET scan without evidence of distant metastatic disease aside from indeterminate small lung nodules  -Restaging CTs 12/01/2012 consistent with enlargement of the left parietal mass and right lower lobe lung nodule  -Initiation of dabrafenib and tremetinib  12/17/2012. She reports extremely poor tolerance. Discontinued.  -Status post resection of a left parietal metastasis 02/09/2013.  -Restaging CT evaluation 03/03/2013. Stable 1.5 cm right lower lobe pulmonary nodule. Multiple peripheral enhancing lesions throughout the liver.  -Initiation of Ipilimumab at [facility] 03/10/2013.  -Cycle 2 Ipilumumab 04/17/2013 2. Arrhythmia, pacemaker in place  3. Hypertension  4. History of a CVA  5. History of deep vein thrombosis  6. Proximal leg weakness-likely related to chronic steroid use.  7. Intermittent rash at the posterior thighs and left hand-likely related to systemic therapy.  8. leg edema-? Related to liver disease,? Secondary to ipilumumab . Improved 9. Headache and mild nausea-coincides with discontinuation of Decadron , CT of the brain 03/25/2011 with no acute change   Disposition:  She appears stable. She will return to [facility] for cycle 3 of ipilumumab on 05/14/2013. She is scheduled for an office visit here on 05/26/2013.   Betsy Coder, MD  04/30/2013  4:09 PM

## 2013-04-30 NOTE — Telephone Encounter (Signed)
gv adn pritned appt sched and avs for pt for March

## 2013-05-11 ENCOUNTER — Other Ambulatory Visit: Payer: Self-pay | Admitting: Internal Medicine

## 2013-05-25 ENCOUNTER — Ambulatory Visit (HOSPITAL_BASED_OUTPATIENT_CLINIC_OR_DEPARTMENT_OTHER): Payer: BC Managed Care – PPO | Admitting: Oncology

## 2013-05-25 ENCOUNTER — Telehealth: Payer: Self-pay | Admitting: Oncology

## 2013-05-25 VITALS — BP 132/83 | HR 84 | Temp 97.6°F | Resp 18 | Ht 63.0 in | Wt 166.6 lb

## 2013-05-25 DIAGNOSIS — C4360 Malignant melanoma of unspecified upper limb, including shoulder: Secondary | ICD-10-CM

## 2013-05-25 DIAGNOSIS — Z86718 Personal history of other venous thrombosis and embolism: Secondary | ICD-10-CM

## 2013-05-25 DIAGNOSIS — I10 Essential (primary) hypertension: Secondary | ICD-10-CM

## 2013-05-25 DIAGNOSIS — C439 Malignant melanoma of skin, unspecified: Secondary | ICD-10-CM

## 2013-05-25 DIAGNOSIS — C7949 Secondary malignant neoplasm of other parts of nervous system: Secondary | ICD-10-CM

## 2013-05-25 DIAGNOSIS — C7931 Secondary malignant neoplasm of brain: Secondary | ICD-10-CM

## 2013-05-25 NOTE — Progress Notes (Signed)
  [place] OFFICE PROGRESS NOTE   Diagnosis: Metastatic melanoma  INTERVAL HISTORY:   She completed dose 3 of ipilumumab on 05/14/2013. She reports tolerating the treatment well. No diarrhea. She has a few erythematous skin lesions. No confluent rash. She was recently treated for oral candidiasis. No seizures or new neurologic symptoms. She had a headache 05/23/2013.  She is scheduled for a restaging brain CT on 05/28/2013.  Objective:  Vital signs in last 24 hours:  Blood pressure 132/83, pulse 84, temperature 97.6 F (36.4 C), temperature source Oral, resp. rate 18, height $RemoveBe'5\' 3"'miXPPdEgL$  (1.6 m), weight 166 lb 9.6 oz (75.569 kg), SpO2 97.00%.    HEENT: No thrush Lymphatics: No cervical or supraclavicular nodes Resp: Lungs clear bilaterally Cardio: Regular rate and rhythm GI: No hepatomegaly, nontender Vascular: No leg edema Neuro: Alert and oriented, the motor exam appears intact in the upper and lower extremities  Skin: Few 2-3 mm purpuric flat areas over the arms    Medications: I have reviewed the patient's current medications.  Assessment/Plan: 1.Metastatic melanoma  -Status post resection of a right arm melanoma, stage II A (T2b N0) in December 2009  -Metastatic melanoma, status post resection of a left cerebellar metastasis 09/30/2012, BRAF mutation identified  -Status post SRS treatment of the left cerebellum and a left parietal metastasis 10/24/2012  -Staging CTs and PET scan without evidence of distant metastatic disease aside from indeterminate small lung nodules  -Restaging CTs 12/01/2012 consistent with enlargement of the left parietal mass and right lower lobe lung nodule  -Initiation of dabrafenib and tremetinib 12/17/2012. She reports extremely poor tolerance. Discontinued.  -Status post resection of a left parietal metastasis 02/09/2013.  -Restaging CT evaluation 03/03/2013. Stable 1.5 cm right lower lobe pulmonary nodule. Multiple peripheral  enhancing lesions throughout the liver.  -Initiation of Ipilimumab at [facility] 03/10/2013.  -Cycle 2 Ipilumumab 04/17/2013  -Cycle 3 Ipilumumab 2. Arrhythmia, pacemaker in place  3. Hypertension  4. History of a CVA  5. History of deep vein thrombosis  6. history of Proximal leg weakness-likely related to chronic steroid use.  7. Intermittent rash at the posterior thighs and left hand-likely related to systemic therapy.  8. leg edema-? Related to liver disease,? Secondary to ipilumumab . Improved on furosemide  9. Headache and mild nausea-coincided with discontinuation of Decadron , CT of the brain 03/25/2011 with no acute change   Disposition:  She appears stable. She will undergo a restaging brain CT at [facility] later this week. She is scheduled for a followup appointment at [facility] on 06/04/2013. She will return for an office visit here 06/18/2013.  Betsy Coder, MD  05/25/2013  4:45 PM

## 2013-05-25 NOTE — Telephone Encounter (Signed)
gv pt appt schedule for april.

## 2013-05-28 ENCOUNTER — Telehealth: Payer: Self-pay | Admitting: *Deleted

## 2013-05-28 NOTE — Telephone Encounter (Signed)
Spoke with pt in lobby, she brought in copy of CT on disc. Reports she saw Dr. Harriet Masson, was given a good report on CT. Plan is for one more dose of [name] next week then follow up with MRI a few weeks later. Disc on Dr. Gearldine Shown desk.

## 2013-06-08 ENCOUNTER — Inpatient Hospital Stay
Admission: RE | Admit: 2013-06-08 | Discharge: 2013-06-08 | Disposition: A | Discharge status: Home / Self Care | Payer: Self-pay | Source: Ambulatory Visit | Attending: Radiation Oncology | Admitting: Radiation Oncology

## 2013-06-08 ENCOUNTER — Other Ambulatory Visit: Payer: Self-pay | Admitting: Radiation Oncology

## 2013-06-08 DIAGNOSIS — C439 Malignant melanoma of skin, unspecified: Secondary | ICD-10-CM

## 2013-06-15 ENCOUNTER — Other Ambulatory Visit: Payer: Self-pay | Admitting: Internal Medicine

## 2013-06-18 ENCOUNTER — Telehealth: Payer: Self-pay | Admitting: Oncology

## 2013-06-18 ENCOUNTER — Ambulatory Visit: Payer: BC Managed Care – PPO | Admitting: Nutrition

## 2013-06-18 ENCOUNTER — Other Ambulatory Visit: Payer: Self-pay | Admitting: *Deleted

## 2013-06-18 ENCOUNTER — Ambulatory Visit (HOSPITAL_BASED_OUTPATIENT_CLINIC_OR_DEPARTMENT_OTHER): Payer: BC Managed Care – PPO | Admitting: Oncology

## 2013-06-18 VITALS — BP 118/66 | HR 79 | Temp 97.4°F | Resp 18 | Ht 63.0 in | Wt 164.8 lb

## 2013-06-18 DIAGNOSIS — R197 Diarrhea, unspecified: Secondary | ICD-10-CM

## 2013-06-18 DIAGNOSIS — C439 Malignant melanoma of skin, unspecified: Secondary | ICD-10-CM

## 2013-06-18 DIAGNOSIS — C7931 Secondary malignant neoplasm of brain: Secondary | ICD-10-CM

## 2013-06-18 DIAGNOSIS — C7949 Secondary malignant neoplasm of other parts of nervous system: Secondary | ICD-10-CM

## 2013-06-18 DIAGNOSIS — C799 Secondary malignant neoplasm of unspecified site: Secondary | ICD-10-CM

## 2013-06-18 MED ORDER — PREDNISONE 20 MG PO TABS: ORAL_TABLET | ORAL | Status: DC

## 2013-06-18 NOTE — Progress Notes (Signed)
  [place] OFFICE PROGRESS NOTE   Diagnosis: Melanoma  INTERVAL HISTORY:   She returns as scheduled. A brain CT at [facility] on 05/28/2013 revealed no area of abnormal enhancement. She completed a fourth treatment with ipilumumab 06/04/2013.  She reports "diarrhea "beginning approximately 2 days after the last treatment. She has approximately 5-7 soft bowel movements per day. She takes MiraLAX daily. No fever. Mild bilateral low abdominal discomfort. She has a history of diverticulitis and usually has a left low abdomen pain with fever when she has diverticulitis.  No rash or new neurologic symptoms. No seizures. No fever.  Objective:  Vital signs in last 24 hours:  Blood pressure 118/66, pulse 79, temperature 97.4 F (36.3 C), temperature source Oral, resp. rate 18, height 5' 3" (1.6 m), weight 164 lb 12.8 oz (74.753 kg), SpO2 100.00%.    HEENT: Mild white coat over the tongue, no buccal thrush Resp: Lungs clear bilaterally Cardio: Regular rate and rhythm GI: No hepatomegaly, mild tenderness in the low abdomen bilaterally, the abdomen is soft, no mass Vascular: No leg edema Neuro: Alert and oriented, the motor exam appears intact in the upper and lower extremities  Skin: No rash   Imaging:  As per history of present illness Medications: I have reviewed the patient's current medications.  Assessment/Plan: 1.Metastatic melanoma  -Status post resection of a right arm melanoma, stage II A (T2b N0) in December 2009  -Metastatic melanoma, status post resection of a left cerebellar metastasis 09/30/2012, BRAF mutation identified  -Status post SRS treatment of the left cerebellum and a left parietal metastasis 10/24/2012  -Staging CTs and PET scan without evidence of distant metastatic disease aside from indeterminate small lung nodules  -Restaging CTs 12/01/2012 consistent with enlargement of the left parietal mass and right lower lobe lung nodule  -Initiation of  dabrafenib and tremetinib 12/17/2012. She reports extremely poor tolerance. Discontinued.  -Status post resection of a left parietal metastasis 02/09/2013.  -Restaging CT evaluation 03/03/2013. Stable 1.5 cm right lower lobe pulmonary nodule. Multiple peripheral enhancing lesions throughout the liver.  -Initiation of Ipilimumab at [facility] 03/10/2013.  -Cycle 2 Ipilumumab 04/17/2013  -Cycle 3 Ipilumumab 05/14/2013 -Cycle 4 Ipilumumab 06/04/2013 2. Arrhythmia, pacemaker in place  3. Hypertension  4. History of a CVA  5. History of deep vein thrombosis  6. history of Proximal leg weakness-likely related to chronic steroid use.  7. Intermittent rash at the posterior thighs and left hand-likely related to systemic therapy.  8. leg edema-? Related to liver disease,? Secondary to ipilumumab . Improved on furosemide  9. Headache and mild nausea-coincided with discontinuation of Decadron , CT of the brain 03/25/2011 with no acute change  10. [place] likely related to Ipilumumab   Disposition:  She has completed 4 cycles of immunotherapy. She will undergo a restaging CT evaluation at [facility] next week. She requested we schedule a brain MRI here the following week.  I discussed the diarrhea with Dr. Harriet Masson. She recommends pulse prednisone therapy. Ms. Cantrall agrees to proceed with prednisone. She will contact Dr.Collichio if the diarrhea does not improve tomorrow. She will discontinue MiraLAX. She develops insomnia and emotional lability when taking steroids. She will use benzodiazepines as needed.  She will return for an office visit here in 3 weeks. We will see her sooner as needed. [name], MD  06/18/2013  4:11 PM

## 2013-06-18 NOTE — Telephone Encounter (Signed)
Prednisone taper Rx called to pt's pharmacy.

## 2013-06-18 NOTE — Progress Notes (Signed)
58 year old female diagnosed with metastatic melanoma.  She is a patient of Dr. Benay Spice.  Past medical history includes hypertension, CVA, DVT, and diabetes.  Medications include Magic mouthwash, vitamin D, klonopin, vitamin B12, Lasix, Glucophage, Nexium, Zofran, MiraLax, K-Dur, prednisone, Compazine, and Crestor.  Labs include glucose of 148 December 16.  Height: 63 inches. Weight: 164.8 pounds in April 23. Usual body weight: 160 pounds. BMI: 29.2.  Patient is now having diarrhea from the chemotherapy medication.  History of diverticulitis typically causes constipation.  She is distressed at having to discontinue MiraLax.  Patient reports concern that she will be taking steroids, which elevate her blood sugars.  Nutrition diagnosis: Food and nutrition related knowledge deficit related to diagnosis of metastatic melanoma, and associated treatments as evidenced by no prior need for nutrition related information.  Intervention: Patient educated on low fiber diet to follow while patient is experiencing diarrhea.  Reviewed foods to eliminate and foods to add to improve diarrhea.  Provided recipe for patient.  Encouraged patient to consume proteins and healthy fats while eating carbohydrates for improved blood sugar control.  Provided fact sheets on protein sources and low fiber diet fact sheets.  Questions were answered.  Teach back method used.  Contact information given.  Monitoring, evaluation, goals: Patient will tolerate adequate calories and protein to improve diarrhea and maintain present weight.  Next visit: Patient will schedule follow up when needed.

## 2013-06-18 NOTE — Telephone Encounter (Signed)
gv pt appt schedule for may. central will call pt w/mri appt - pt aware.

## 2013-06-29 ENCOUNTER — Encounter: Payer: Self-pay | Admitting: Radiation Therapy

## 2013-06-29 ENCOUNTER — Encounter: Payer: Self-pay | Admitting: Radiation Oncology

## 2013-06-29 NOTE — Progress Notes (Signed)
[name] called to let me know that she is having some GI issues since her last dose of [name]. She has been on a prednisone taper for the presumed colitis and when she tapers down her diarrhea resumes. Her dosage has been increased again with the hopes that her symptoms will resolve.  But if they do not, Dr. Harriet Masson would like her admitted for IV treatment in [facility]. [name] has a brain MRI scheduled at [facility] on Wed 5/6. Due to her having a Biotron[employer] pacemaker,   her cardiologist needs to be present for the scan. Her hope is that she will be able to have her MRI done, but she wanted to let me know that there is a possibility that she will be admitted.       If her MRI is completed as planned on Wed, she wants a copy of the report faxed to Dr. Harriet Masson and for someone to call her with the results.    Mont Dutton

## 2013-06-30 ENCOUNTER — Telehealth: Payer: Self-pay | Admitting: Cardiology

## 2013-06-30 ENCOUNTER — Telehealth: Payer: Self-pay | Admitting: *Deleted

## 2013-06-30 NOTE — Telephone Encounter (Signed)
Pharmacy will contact Dr Benay Spice for this hydration order.

## 2013-06-30 NOTE — Telephone Encounter (Signed)
VM from [facility] that patient is having "profuse diarrhea" and Dr. Lucretia Field is concerned about her. Has had #4 doses of ipilumab. Wants Dr. Benay Spice to see her today.

## 2013-06-30 NOTE — Telephone Encounter (Signed)
Notified nurse navigator that Dr. Benay Spice is unable to see her today. Suggested they send her to [facility] emergency room if she needs immediate attention. Will inquire for opening to be worked in Architectural technologist.

## 2013-06-30 NOTE — Telephone Encounter (Signed)
Walk in.  1617 Notified by [employer] collaborative needs Triage help.  Reviewed chart and to lobby at 1630.  Patient reports "[facility] called [place] earlier.  Instructed her to come here for lab work.  Suspect toxicity of diarrhea from Treatment received at [facility].  Informed her [facility] outpatient lab closed at 4:00 pm.  Reports she will drive to [facility] for admission.

## 2013-06-30 NOTE — Telephone Encounter (Signed)
New message     Home care nurse is calling them needing supplies. Need new orders for normal saline bag.  Fax orders to [phone].  Order has expired

## 2013-07-01 ENCOUNTER — Telehealth: Payer: Self-pay | Admitting: *Deleted

## 2013-07-01 ENCOUNTER — Ambulatory Visit (HOSPITAL_COMMUNITY): Admission: RE | Admit: 2013-07-01 | Payer: BC Managed Care – PPO | Source: Ambulatory Visit

## 2013-07-01 NOTE — Telephone Encounter (Signed)
Asking for IV fluid orders for weekly NS at home. Dr. Benay Spice had not signed these orders in past-chart has Dr. Dorris Carnes. Informed them that she has been admitted to [facility] for diarrhea as of last night. May want to follow up with [facility].

## 2013-07-02 ENCOUNTER — Telehealth: Payer: Self-pay | Admitting: Internal Medicine

## 2013-07-02 ENCOUNTER — Ambulatory Visit: Payer: BC Managed Care – PPO

## 2013-07-02 NOTE — Telephone Encounter (Signed)
New message   [facility] With [facility] pharmacy called. Requests to have orders put in for the pt's saline bag. Please call back to discuss further or give verbal orders.

## 2013-07-02 NOTE — Telephone Encounter (Signed)
Spoke with [place], verbal orders given for IV saline 1000cc once weekly.

## 2013-07-02 NOTE — Telephone Encounter (Signed)
Spoke with Restaurant manager, fast food at [facility]. She is aware that the patient is being discharged from [facility] this PM and will need resumption of care with IV therapy starting on 5/8. Advised also that order for IV therapy is for weekly plus PRN per Dr.Ross. She will fax an order to [place] for sig.

## 2013-07-03 ENCOUNTER — Telehealth: Payer: Self-pay | Admitting: *Deleted

## 2013-07-03 NOTE — Telephone Encounter (Signed)
Called pt to inform her MRI scheduled for 5/12 at 8pm with Dr. Caryl Comes present, arrive at [place]. Patient verbalized understanding and agreeable to plan.

## 2013-07-06 ENCOUNTER — Ambulatory Visit
Admission: RE | Admit: 2013-07-06 | Payer: BC Managed Care – PPO | Source: Ambulatory Visit | Admitting: Radiation Oncology

## 2013-07-07 ENCOUNTER — Ambulatory Visit (HOSPITAL_COMMUNITY)
Admission: RE | Admit: 2013-07-07 | Discharge: 2013-07-07 | Disposition: A | Discharge status: Home / Self Care | Payer: BC Managed Care – PPO | Source: Ambulatory Visit | Attending: Oncology | Admitting: Oncology

## 2013-07-07 DIAGNOSIS — C7931 Secondary malignant neoplasm of brain: Secondary | ICD-10-CM | POA: Insufficient documentation

## 2013-07-07 DIAGNOSIS — C439 Malignant melanoma of skin, unspecified: Secondary | ICD-10-CM

## 2013-07-07 DIAGNOSIS — C799 Secondary malignant neoplasm of unspecified site: Secondary | ICD-10-CM

## 2013-07-07 DIAGNOSIS — C7949 Secondary malignant neoplasm of other parts of nervous system: Secondary | ICD-10-CM

## 2013-07-07 MED ORDER — GADOBENATE DIMEGLUMINE 529 MG/ML IV SOLN
15.0000 mL | Freq: Once | INTRAVENOUS | Status: AC
  Administered 2013-07-07: 15 mL via INTRAVENOUS

## 2013-07-09 ENCOUNTER — Telehealth: Payer: Self-pay | Admitting: *Deleted

## 2013-07-09 NOTE — Telephone Encounter (Signed)
Message from [place] with Dr. Harriet Masson, they do not require any labs. Labs per Dr. Gearldine Shown discretion. Will add lab after office visit if needed.

## 2013-07-09 NOTE — Telephone Encounter (Signed)
Message copied by Brien Few on Thu Jul 09, 2013 12:09 PM ------      Message from: Betsy Coder B      Created: Wed Jul 08, 2013  8:11 PM       Please call patient, mri is negative for tumor ------

## 2013-07-09 NOTE — Telephone Encounter (Addendum)
Called pt, she received MRI results from Dr. Harriet Masson who requested she have labs drawn while in the office on 5/15. Left message on voicemail for Nevin Bloodgood in [place] office requesting lab orders.

## 2013-07-10 ENCOUNTER — Other Ambulatory Visit: Payer: Self-pay | Admitting: *Deleted

## 2013-07-10 ENCOUNTER — Other Ambulatory Visit: Payer: Self-pay | Admitting: Oncology

## 2013-07-10 ENCOUNTER — Ambulatory Visit (HOSPITAL_BASED_OUTPATIENT_CLINIC_OR_DEPARTMENT_OTHER): Payer: BC Managed Care – PPO | Admitting: Oncology

## 2013-07-10 ENCOUNTER — Ambulatory Visit (HOSPITAL_BASED_OUTPATIENT_CLINIC_OR_DEPARTMENT_OTHER): Payer: BC Managed Care – PPO

## 2013-07-10 ENCOUNTER — Telehealth: Payer: Self-pay | Admitting: Oncology

## 2013-07-10 VITALS — BP 140/86 | HR 79 | Temp 97.6°F | Resp 20 | Ht 63.0 in | Wt 157.8 lb

## 2013-07-10 DIAGNOSIS — C799 Secondary malignant neoplasm of unspecified site: Secondary | ICD-10-CM

## 2013-07-10 DIAGNOSIS — Z8673 Personal history of transient ischemic attack (TIA), and cerebral infarction without residual deficits: Secondary | ICD-10-CM

## 2013-07-10 DIAGNOSIS — R911 Solitary pulmonary nodule: Secondary | ICD-10-CM

## 2013-07-10 DIAGNOSIS — Z86718 Personal history of other venous thrombosis and embolism: Secondary | ICD-10-CM

## 2013-07-10 DIAGNOSIS — C439 Malignant melanoma of skin, unspecified: Secondary | ICD-10-CM

## 2013-07-10 DIAGNOSIS — C7931 Secondary malignant neoplasm of brain: Secondary | ICD-10-CM

## 2013-07-10 DIAGNOSIS — C4360 Malignant melanoma of unspecified upper limb, including shoulder: Secondary | ICD-10-CM

## 2013-07-10 DIAGNOSIS — C7949 Secondary malignant neoplasm of other parts of nervous system: Secondary | ICD-10-CM

## 2013-07-10 DIAGNOSIS — R197 Diarrhea, unspecified: Secondary | ICD-10-CM

## 2013-07-10 DIAGNOSIS — K7689 Other specified diseases of liver: Secondary | ICD-10-CM

## 2013-07-10 LAB — CBC WITH DIFFERENTIAL/PLATELET
BASO%: 0.5 % (ref 0.0–2.0)
Basophils Absolute: 0.1 10*3/uL (ref 0.0–0.1)
EOS%: 0.6 % (ref 0.0–7.0)
Eosinophils Absolute: 0.1 10*3/uL (ref 0.0–0.5)
HCT: 43.8 % (ref 34.8–46.6)
HGB: 14.5 g/dL (ref 11.6–15.9)
LYMPH%: 21.8 % (ref 14.0–49.7)
MCH: 27.2 pg (ref 25.1–34.0)
MCHC: 33 g/dL (ref 31.5–36.0)
MCV: 82.4 fL (ref 79.5–101.0)
MONO#: 1.1 10*3/uL — ABNORMAL HIGH (ref 0.1–0.9)
MONO%: 10.7 % (ref 0.0–14.0)
NEUT#: 7.1 10*3/uL — ABNORMAL HIGH (ref 1.5–6.5)
NEUT%: 66.4 % (ref 38.4–76.8)
Platelets: 247 10*3/uL (ref 145–400)
RBC: 5.32 10*6/uL (ref 3.70–5.45)
RDW: 15.4 % — ABNORMAL HIGH (ref 11.2–14.5)
WBC: 10.6 10*3/uL — ABNORMAL HIGH (ref 3.9–10.3)
lymph#: 2.3 10*3/uL (ref 0.9–3.3)

## 2013-07-10 LAB — COMPREHENSIVE METABOLIC PANEL (CC13)
ALT: 29 U/L (ref 0–55)
AST: 18 U/L (ref 5–34)
Albumin: 4.3 g/dL (ref 3.5–5.0)
Alkaline Phosphatase: 107 U/L (ref 40–150)
Anion Gap: 14 mEq/L — ABNORMAL HIGH (ref 3–11)
BUN: 18.5 mg/dL (ref 7.0–26.0)
CO2: 26 mEq/L (ref 22–29)
Calcium: 10.2 mg/dL (ref 8.4–10.4)
Chloride: 101 mEq/L (ref 98–109)
Creatinine: 1 mg/dL (ref 0.6–1.1)
Glucose: 92 mg/dl (ref 70–140)
Potassium: 3.2 mEq/L — ABNORMAL LOW (ref 3.5–5.1)
Sodium: 142 mEq/L (ref 136–145)
Total Bilirubin: 0.53 mg/dL (ref 0.20–1.20)
Total Protein: 7.8 g/dL (ref 6.4–8.3)

## 2013-07-10 LAB — MAGNESIUM (CC13): Magnesium: 2.5 mg/dl (ref 1.5–2.5)

## 2013-07-10 LAB — LACTATE DEHYDROGENASE (CC13): LDH: 298 U/L — ABNORMAL HIGH (ref 125–245)

## 2013-07-10 MED ORDER — POTASSIUM CHLORIDE 20 MEQ/15ML (10%) PO SOLN: 20.0000 meq | Freq: Three times a day (TID) | ORAL | Status: DC

## 2013-07-10 NOTE — Telephone Encounter (Signed)
gv adnprnted appt sched and avs for pt for June

## 2013-07-10 NOTE — Progress Notes (Signed)
[place] OFFICE PROGRESS NOTE   Diagnosis: Metastatic melanoma  INTERVAL HISTORY:   She was admitted to [facility] 06/30/2013 with persistent diarrhea. A CT revealed diverticulosis without evidence of diverticulitis. She was treated with IV steroids, a C. difficile assay was negative. She was discharged 07/02/2013 and remains on prednisone at a dose of 30 mg daily.  She has a good appetite. She reports 6-8 loose stools per day. No abdominal pain. She is taking Lasix once daily.  Objective:  Vital signs in last 24 hours:  Blood pressure 140/86, pulse 79, temperature 97.6 F (36.4 C), temperature source Oral, resp. rate 20, height 5' 3"  (1.6 m), weight 157 lb 12.8 oz (71.578 kg), SpO2 100.00%.    HEENT: No thrush, the mucous membranes are moist Resp: Lungs clear bilaterally Cardio: Regular rate and rhythm GI: No hepatosplenomegaly, nontender, no mass, soft Vascular: No leg edema  Skin: No rash    Lab Results:  Lab Results  Component Value Date   WBC 10.6* 07/10/2013   HGB 14.5 07/10/2013   HCT 43.8 07/10/2013   MCV 82.4 07/10/2013   PLT 247 07/10/2013   NEUTROABS 7.1* 07/10/2013   potassium 3.2, creatinine 1.0, LDH 298, magnesium pending   Imaging: MRI of the brain 07/07/2013-no evidence of progressive metastatic disease  Medications: I have reviewed the patient's current medications.  Assessment/Plan: 1.Metastatic melanoma  -Status post resection of a right arm melanoma, stage II A (T2b N0) in December 2009  -Metastatic melanoma, status post resection of a left cerebellar metastasis 09/30/2012, BRAF mutation identified  -Status post SRS treatment of the left cerebellum and a left parietal metastasis 10/24/2012  -Staging CTs and PET scan without evidence of distant metastatic disease aside from indeterminate small lung nodules  -Restaging CTs 12/01/2012 consistent with enlargement of the left parietal mass and right lower lobe lung nodule  -Initiation of  dabrafenib and tremetinib 12/17/2012. She reports extremely poor tolerance. Discontinued.  -Status post resection of a left parietal metastasis 02/09/2013.  -Restaging CT evaluation 03/03/2013. Stable 1.5 cm right lower lobe pulmonary nodule. Multiple peripheral enhancing lesions throughout the liver.  -Initiation of Ipilimumab at [facility] 03/10/2013.  -Cycle 2 Ipilumumab 04/17/2013  -Cycle 3 Ipilumumab 05/14/2013  -Cycle 4 Ipilumumab 06/04/2013  -Restaging CT at [facility] 06/25/2013 with multiple subcentimeter liver lesions, no new lesions, decreased right lower lobe nodule -Brain MRI 07/07/2013 with no evidence of progressive metastatic disease 2. Arrhythmia, pacemaker in place  3. Hypertension  4. History of a CVA  5. History of deep vein thrombosis  6. history of Proximal leg weakness-likely related to chronic steroid use.  7. Intermittent rash at the posterior thighs and left hand-likely related to systemic therapy.  8. leg edema-? Related to liver disease,? Secondary to ipilumumab . Improved on furosemide  9. Headache and mild nausea-coincided with discontinuation of Decadron , CT of the brain 03/25/2011 with no acute change  10. 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 There has been a clinical response. She now has treatment-related diarrhea. I recommended she discontinue Lasix. We increased the potassium to 3 times per day. She will continue prednisone at current dose. We will contact Dr.Collichio to get her opinion regarding steroid dosing and other treatment options.  She will return for a chemistry panel 07/13/2013. She is scheduled for an office visit in one month. We will see her sooner as  needed.  [name], MD  07/10/2013  11:55 AM

## 2013-07-13 ENCOUNTER — Telehealth: Payer: Self-pay | Admitting: Oncology

## 2013-07-13 ENCOUNTER — Ambulatory Visit
Admission: RE | Admit: 2013-07-13 | Discharge: 2013-07-13 | Disposition: A | Discharge status: Home / Self Care | Payer: BC Managed Care – PPO | Source: Ambulatory Visit | Attending: Radiation Oncology | Admitting: Radiation Oncology

## 2013-07-13 ENCOUNTER — Other Ambulatory Visit (HOSPITAL_BASED_OUTPATIENT_CLINIC_OR_DEPARTMENT_OTHER): Payer: BC Managed Care – PPO

## 2013-07-13 ENCOUNTER — Telehealth: Payer: Self-pay | Admitting: *Deleted

## 2013-07-13 ENCOUNTER — Encounter: Payer: Self-pay | Admitting: Radiation Oncology

## 2013-07-13 ENCOUNTER — Ambulatory Visit (HOSPITAL_BASED_OUTPATIENT_CLINIC_OR_DEPARTMENT_OTHER): Payer: BC Managed Care – PPO | Admitting: Oncology

## 2013-07-13 VITALS — BP 154/100 | HR 80 | Temp 97.7°F | Resp 20 | Ht 63.0 in | Wt 161.9 lb

## 2013-07-13 VITALS — BP 122/76 | Temp 97.0°F | Resp 18 | Ht 63.0 in | Wt 160.7 lb

## 2013-07-13 DIAGNOSIS — R21 Rash and other nonspecific skin eruption: Secondary | ICD-10-CM

## 2013-07-13 DIAGNOSIS — R197 Diarrhea, unspecified: Secondary | ICD-10-CM

## 2013-07-13 DIAGNOSIS — C7931 Secondary malignant neoplasm of brain: Secondary | ICD-10-CM

## 2013-07-13 DIAGNOSIS — C799 Secondary malignant neoplasm of unspecified site: Secondary | ICD-10-CM

## 2013-07-13 DIAGNOSIS — C4360 Malignant melanoma of unspecified upper limb, including shoulder: Secondary | ICD-10-CM

## 2013-07-13 DIAGNOSIS — C7949 Secondary malignant neoplasm of other parts of nervous system: Secondary | ICD-10-CM

## 2013-07-13 DIAGNOSIS — K7689 Other specified diseases of liver: Secondary | ICD-10-CM

## 2013-07-13 DIAGNOSIS — C439 Malignant melanoma of skin, unspecified: Secondary | ICD-10-CM

## 2013-07-13 DIAGNOSIS — Z8673 Personal history of transient ischemic attack (TIA), and cerebral infarction without residual deficits: Secondary | ICD-10-CM

## 2013-07-13 DIAGNOSIS — R911 Solitary pulmonary nodule: Secondary | ICD-10-CM

## 2013-07-13 DIAGNOSIS — Z86718 Personal history of other venous thrombosis and embolism: Secondary | ICD-10-CM

## 2013-07-13 LAB — COMPREHENSIVE METABOLIC PANEL (CC13)
ALT: 29 U/L (ref 0–55)
AST: 18 U/L (ref 5–34)
Albumin: 3.8 g/dL (ref 3.5–5.0)
Alkaline Phosphatase: 88 U/L (ref 40–150)
Anion Gap: 12 mEq/L — ABNORMAL HIGH (ref 3–11)
BUN: 16.6 mg/dL (ref 7.0–26.0)
CO2: 23 mEq/L (ref 22–29)
Calcium: 9.4 mg/dL (ref 8.4–10.4)
Chloride: 107 mEq/L (ref 98–109)
Creatinine: 0.9 mg/dL (ref 0.6–1.1)
Glucose: 79 mg/dl (ref 70–140)
Potassium: 3.5 mEq/L (ref 3.5–5.1)
Sodium: 142 mEq/L (ref 136–145)
Total Bilirubin: 0.4 mg/dL (ref 0.20–1.20)
Total Protein: 6.8 g/dL (ref 6.4–8.3)

## 2013-07-13 LAB — MAGNESIUM (CC13): Magnesium: 2.4 mg/dl (ref 1.5–2.5)

## 2013-07-13 MED ORDER — BIAFINE EX EMUL: CUTANEOUS | Status: DC | PRN

## 2013-07-13 NOTE — Progress Notes (Signed)
  [name] OFFICE PROGRESS NOTE   Diagnosis: Metastatic melanoma  INTERVAL HISTORY:   She returns as scheduled. The prednisone was increased to 50 mg daily on 07/10/2013. She reports increased emotional lability with this dose. She continues to have diarrhea. She reports 8-9 bowel movements [name] today. The diarrhea occurs after eating. No bleeding or pain.  She called [facility] yesterday and received 1 L of IV fluids.  She discontinued furosemide.  Objective:  Vital signs in last 24 hours:  Blood pressure 122/76, temperature 97 F (36.1 C), temperature source Oral, resp. rate 18, height _0  (1.6 m), weight 160 lb 11.2 oz (72.893 kg).    HEENT: No thrush Resp: Lungs clear bilaterally Cardio: Regular rate and rhythm GI: No hepatomegaly, nontender, soft Vascular: No leg edema   Lab Results:  Lab Results  Component Value Date   WBC 10.6* 07/10/2013   HGB 14.5 07/10/2013   HCT 43.8 07/10/2013   MCV 82.4 07/10/2013   PLT 247 07/10/2013   NEUTROABS 7.1* 07/10/2013   07/13/2013-potassium 3.5, creatinine 0.9, BUN 16.6, CO2 23, magnesium 2.4   Medications: I have reviewed the patient's current medications.  Assessment/Plan: 1.Metastatic melanoma  -Status post resection of a right arm melanoma, stage II A (T2b N0) in December 2009  -Metastatic melanoma, status post resection of a left cerebellar metastasis 09/30/2012, BRAF mutation identified  -Status post SRS treatment of the left cerebellum and a left parietal metastasis 10/24/2012  -Staging CTs and PET scan without evidence of distant metastatic disease aside from indeterminate small lung nodules  -Restaging CTs 12/01/2012 consistent with enlargement of the left parietal mass and right lower lobe lung nodule  -Initiation of dabrafenib and tremetinib 12/17/2012. She reports extremely poor tolerance. Discontinued.  -Status post resection of a left parietal metastasis 02/09/2013.  -Restaging CT  evaluation 03/03/2013. Stable 1.5 cm right lower lobe pulmonary nodule. Multiple peripheral enhancing lesions throughout the liver.  -Initiation of Ipilimumab at [facility] 03/10/2013.  -Cycle 2 Ipilumumab 04/17/2013  -Cycle 3 Ipilumumab 05/14/2013  -Cycle 4 Ipilumumab 06/04/2013  -Restaging CT at [facility] 06/25/2013 with multiple subcentimeter liver lesions, no new lesions, decreased right lower lobe nodule  -Brain MRI 07/07/2013 with no evidence of progressive metastatic disease  2. Arrhythmia, pacemaker in place  3. Hypertension  4. History of a CVA  5. History of deep vein thrombosis  6. history of Proximal leg weakness-likely related to chronic steroid use.  7. Intermittent rash at the posterior thighs and left hand-likely related to systemic therapy.  8. leg edema-? Related to liver disease,? Secondary to ipilumumab . Improved on furosemide  9. Headache and mild nausea-coincided with discontinuation of Decadron , CT of the brain 03/25/2011 with no acute change  10. 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 I recommended she continue prednisone at a dose of 50 mg daily. She plans to schedule an appointment with Dr. Harriet Masson for [id number]. She will return for an office and lab visit here on 07/24/2013.    Amanda Pier, MD  07/13/2013  11:44 AM

## 2013-07-13 NOTE — Telephone Encounter (Signed)
Gave pt appt for lab and md for May 2015

## 2013-07-13 NOTE — Telephone Encounter (Signed)
Pt calls in explaining that due to her recent colitis issues, she has been started on high dose Prednisone - 50 mg qd. She states this is in turn causing HTN and would like to take extra metoprolol while taking this med. She states that at doctor office today she was 140/120.  Will review with Dr. Caryl Comes and get back with her tomorrow. She is agreeable to this.

## 2013-07-13 NOTE — Telephone Encounter (Signed)
added appt today per pof...pt already aware per Amy

## 2013-07-13 NOTE — Progress Notes (Signed)
Follow up MRI 07/07/13, stopped Curt Bears, has diarrhea from that, taking brat diet , no pain, has headaches, on prednisone  Was on nystatin for thrush, from [place], appetite good,  Saw Dr. Benay Spice here Friday 07/10/13,  10:14 AM

## 2013-07-13 NOTE — Progress Notes (Signed)
Radiation Oncology         [phone]) [phone] ________________________________  Name: [name] MRN: [id number]  Date: 07/13/2013  DOB: 1955-09-12  Follow-Up Visit Note  CC: [name], MD  Panosh, Standley Brooking, MD  Diagnosis:   Metastatic melanoma with brain metastasis  Interval Since Last Radiation:  9 months   Narrative:  The patient returns today for routine follow-up.  The patient had a recent MRI scan on 07/07/2013 and presents today to review this result. She has been receiving treatment at [facility] with Curt Bears, and also has seen Dr. Benay Spice in medical oncology here locally to help coordinate her care. She feels that she did well overall with his treatment. She was admitted earlier this month with persistent diarrhea and was diagnosed with diverticulosis. She has been on steroids and she is scheduled to see Dr. Benay Spice again later today regarding this issue.                              ALLERGIES:  is allergic to doxycycline; erythromycin; penicillins; preparation h; and sulfonamide derivatives.  Meds: Current Outpatient Prescriptions  Medication Sig Dispense Refill  . acetaminophen (TYLENOL) 500 MG tablet Take 1,000 mg by mouth every 6 (six) hours as needed for pain.      Marland Kitchen albuterol (VENTOLIN HFA) 108 (90 BASE) MCG/ACT inhaler Inhale 2 puffs into the lungs as needed.      . Alum & Mag Hydroxide-Simeth (MAGIC MOUTHWASH W/LIDOCAINE) SOLN Take 15 mLs by mouth 4 (four) times daily as needed for mouth pain.      . cholecalciferol (VITAMIN D) 1000 UNITS tablet Take 1,000 Units by mouth daily.      . clonazePAM (KLONOPIN) 0.5 MG tablet Take 1 tablet (0.5 mg total) by mouth 3 (three) times daily as needed for anxiety.  50 tablet  0  . cloNIDine (CATAPRES) 0.1 MG tablet Take 1 tablet (0.1 mg total) by mouth at bedtime.  90 tablet  1  . cyanocobalamin (,VITAMIN B-12,) 1000 MCG/ML injection Inject 1,000 mcg into the muscle every 30 (thirty) days.       . diphenhydrAMINE  (BENADRYL) 25 MG tablet Take 12.5 mg by mouth every 6 (six) hours as needed. Seasonal allergies      . eszopiclone (LUNESTA) 2 MG TABS tablet Take 2 mg by mouth at bedtime as needed for sleep. Take immediately before bedtime      . fluorouracil (EFUDEX) 5 % cream Apply 1 application topically daily as needed (hemorrhoids).       . hydrocortisone (ANUSOL-HC) 25 MG suppository Place 25 mg rectally at bedtime as needed for hemorrhoids.      . hydrocortisone 2.5 % cream Apply topically 2 (two) times daily as needed.      . hydrocortisone-pramoxine (ANALPRAM-HC) 2.5-1 % rectal cream Place 1 application rectally at bedtime as needed for hemorrhoids.      . mesalamine (CANASA) 1000 MG suppository Place 1,000 mg rectally at bedtime as needed (hemorrhoids).       . metFORMIN (GLUCOPHAGE) 500 MG tablet Take 500 mg by mouth daily after breakfast.      . metoprolol tartrate (LOPRESSOR) 25 MG tablet take 1 tablet twice daily  60 tablet  5  . NEXIUM 40 MG capsule TAKE ONE CAPSULE BY MOUTH TWICE DAILY BEFORE A MEAL  60 capsule  0  . nystatin (MYCOSTATIN) 100000 UNIT/ML suspension Take 5 mLs by mouth 4 (four) times daily.      Marland Kitchen  ondansetron (ZOFRAN) 8 MG tablet Take 8 mg by mouth every 8 (eight) hours as needed.      . ondansetron (ZOFRAN-ODT) 4 MG disintegrating tablet Take 4 mg by mouth every 8 (eight) hours as needed for nausea.       . polyethylene glycol powder (GLYCOLAX/MIRALAX) powder Stir 17 grams in 8oz of water, or juice, 1 or 2 times daily for constipation   1054 g  2  . potassium chloride 20 MEQ/15ML (10%) SOLN Take 15 mLs (20 mEq total) by mouth 3 (three) times daily.  900 mL  0  . predniSONE (DELTASONE) 20 MG tablet 07/10/13 Currently taking 50 mg      . prochlorperazine (COMPAZINE) 10 MG tablet Take 10 mg by mouth every 6 (six) hours as needed.       . sodium chloride (OCEAN) 0.65 % nasal spray       . CRESTOR 5 MG tablet TAKE ONE TABLET BY MOUTH ONE TIME DAILY   90 tablet  0  . furosemide (LASIX)  40 MG tablet Take 40 mg by mouth 2 (two) times daily.      Marland Kitchen HYDROcodone-acetaminophen (NORCO) 5-325 MG per tablet Take 1 tablet by mouth every 6 (six) hours as needed for moderate pain.  30 tablet  0  . levETIRAcetam (KEPPRA) 500 MG tablet Take 500 mg by mouth 2 (two) times daily.       No current facility-administered medications for this encounter.    Physical Findings: The patient is in no acute distress. Patient is alert and oriented.  height is 5\' 3"  (1.6 m) and weight is 161 lb 14.4 oz (73.437 kg). Her oral temperature is 97.7 F (36.5 C). Her blood pressure is 154/100 and her pulse is 80. Her respiration is 20 and oxygen saturation is 99%. .     Lab Findings: Lab Results  Component Value Date   WBC 10.6* 07/10/2013   HGB 14.5 07/10/2013   HCT 43.8 07/10/2013   MCV 82.4 07/10/2013   PLT 247 07/10/2013     Radiographic Findings: Mr Jeri Cos Wo Contrast  07/07/2013   CLINICAL DATA:  Metastatic melanoma follow-up  EXAM: MRI HEAD WITHOUT AND WITH CONTRAST  TECHNIQUE: Multiplanar, multiecho pulse sequences of the brain and surrounding structures were obtained without and with intravenous contrast.  CONTRAST:  72mL MULTIHANCE GADOBENATE DIMEGLUMINE 529 MG/ML IV SOLN  COMPARISON:  CT 05/28/2013.  MRI 02/20/2013  FINDINGS: The patient has a pacemaker. The patient was monitored during the scan by doctor Caryl Comes.  Postop left occipital craniotomy for cerebellar tumor resection in the inferior left cerebellum. Small amount of chronic hemosiderin in the left inferior cerebellum. No recurrent tumor in the cerebellum.  Postop left parietal craniotomy for resection of left parietal metastatic deposit. There is an area of encephalomalacia and hemosiderin in the left medial parietal lobe corresponding to the site of tumor resection. The hematoma has contracted considerably since the MRI of 02/20/2013. Postcontrast imaging reveals no enhancing residual tumor.  No new areas of tumor enhancement are identified.   Ventricle size is normal. Negative for acute infarct. No edema or shift of the midline structures.  IMPRESSION: Left parietal tumor resection field shows progressive contraction compared with the prior MRI. No evidence of recurrent tumor in this area.  Stable postop changes in the left inferior cerebellum without tumor recurrence.  No new areas of metastatic disease identified.   Electronically Signed   By: Franchot Gallo M.D.   On: 07/07/2013 20:55  Impression:    The patient clinically is doing fairly well except for some ongoing diarrhea. This is being addressed by both Dr. Benay Spice and Dr. Harriet Masson at [facility]. We reviewed the patient's MRI scan of the brain earlier today in brain conference. We were very pleased with this. No sign of local recurrence and no new areas of concern.  Plan:  We will continue followup with the patient through the brain program here. I discussed followup options with her and I believe that a CT scan of the head with contrast would be reasonable in approximately 3 months. The patient's pacemaker makes it problematic to undergo frequent MRI scans of the brain although this has been able to be worked out. Possible MRI scan of the brain in 6 months if this is warranted. She indicated an interest in having her next CT scan of the head at [facility], so we will obtain this imaging study and review it here as well.  I spent 15 minutes with the patient today, the majority of which was spent counseling the patient on the diagnosis of cancer and coordinating care.   Jodelle Gross, M.D., Ph.D.

## 2013-07-14 ENCOUNTER — Other Ambulatory Visit: Payer: Self-pay | Admitting: *Deleted

## 2013-07-14 MED ORDER — METOPROLOL TARTRATE 25 MG PO TABS: 25.0000 mg | ORAL_TABLET | Freq: Two times a day (BID) | ORAL | Status: DC

## 2013-07-14 NOTE — Telephone Encounter (Signed)
Advised to increase Metoprolol. Pt states she is going to take 37.5 mg BID to see if helps, if not she will increase it to 50 mg BID -- she will only be taking this while on Prednison. Refill sent to [facility] per pt request. Pt will call me to let me know if she needs to continue this med dose after prednisone therapy.    (She spoke with Dr. Harriet Masson today and they are increasing Prednisone to 70 mg qd - if colitis doesn't resolve by Thursday office visit with her, admitting to hospital for IV medication treatment for 4-5 days)

## 2013-07-20 ENCOUNTER — Other Ambulatory Visit: Payer: Self-pay | Admitting: Oncology

## 2013-07-20 DIAGNOSIS — C439 Malignant melanoma of skin, unspecified: Secondary | ICD-10-CM

## 2013-07-24 ENCOUNTER — Telehealth: Payer: Self-pay | Admitting: Oncology

## 2013-07-24 ENCOUNTER — Telehealth: Payer: Self-pay | Admitting: *Deleted

## 2013-07-24 ENCOUNTER — Ambulatory Visit (HOSPITAL_BASED_OUTPATIENT_CLINIC_OR_DEPARTMENT_OTHER): Payer: BC Managed Care – PPO | Admitting: Nurse Practitioner

## 2013-07-24 ENCOUNTER — Other Ambulatory Visit (HOSPITAL_BASED_OUTPATIENT_CLINIC_OR_DEPARTMENT_OTHER): Payer: BC Managed Care – PPO

## 2013-07-24 VITALS — BP 136/88 | HR 80 | Temp 97.4°F | Resp 18 | Ht 63.0 in | Wt 158.6 lb

## 2013-07-24 DIAGNOSIS — C439 Malignant melanoma of skin, unspecified: Secondary | ICD-10-CM

## 2013-07-24 DIAGNOSIS — C799 Secondary malignant neoplasm of unspecified site: Secondary | ICD-10-CM

## 2013-07-24 DIAGNOSIS — Z86718 Personal history of other venous thrombosis and embolism: Secondary | ICD-10-CM

## 2013-07-24 DIAGNOSIS — C7949 Secondary malignant neoplasm of other parts of nervous system: Secondary | ICD-10-CM

## 2013-07-24 DIAGNOSIS — C7931 Secondary malignant neoplasm of brain: Secondary | ICD-10-CM

## 2013-07-24 DIAGNOSIS — C4360 Malignant melanoma of unspecified upper limb, including shoulder: Secondary | ICD-10-CM

## 2013-07-24 DIAGNOSIS — R197 Diarrhea, unspecified: Secondary | ICD-10-CM

## 2013-07-24 DIAGNOSIS — E876 Hypokalemia: Secondary | ICD-10-CM

## 2013-07-24 LAB — CBC WITH DIFFERENTIAL/PLATELET
BASO%: 0.2 % (ref 0.0–2.0)
Basophils Absolute: 0 10*3/uL (ref 0.0–0.1)
EOS%: 0.8 % (ref 0.0–7.0)
Eosinophils Absolute: 0.1 10*3/uL (ref 0.0–0.5)
HCT: 41.9 % (ref 34.8–46.6)
HGB: 13.6 g/dL (ref 11.6–15.9)
LYMPH%: 24.9 % (ref 14.0–49.7)
MCH: 27 pg (ref 25.1–34.0)
MCHC: 32.6 g/dL (ref 31.5–36.0)
MCV: 82.9 fL (ref 79.5–101.0)
MONO#: 1.3 10*3/uL — ABNORMAL HIGH (ref 0.1–0.9)
MONO%: 12.3 % (ref 0.0–14.0)
NEUT#: 6.8 10*3/uL — ABNORMAL HIGH (ref 1.5–6.5)
NEUT%: 61.8 % (ref 38.4–76.8)
Platelets: 251 10*3/uL (ref 145–400)
RBC: 5.05 10*6/uL (ref 3.70–5.45)
RDW: 15.9 % — ABNORMAL HIGH (ref 11.2–14.5)
WBC: 10.9 10*3/uL — ABNORMAL HIGH (ref 3.9–10.3)
lymph#: 2.7 10*3/uL (ref 0.9–3.3)

## 2013-07-24 LAB — COMPREHENSIVE METABOLIC PANEL (CC13)
ALT: 33 U/L (ref 0–55)
AST: 16 U/L (ref 5–34)
Albumin: 3.9 g/dL (ref 3.5–5.0)
Alkaline Phosphatase: 82 U/L (ref 40–150)
Anion Gap: 13 mEq/L — ABNORMAL HIGH (ref 3–11)
BUN: 13.4 mg/dL (ref 7.0–26.0)
CO2: 22 mEq/L (ref 22–29)
Calcium: 9.5 mg/dL (ref 8.4–10.4)
Chloride: 105 mEq/L (ref 98–109)
Creatinine: 0.9 mg/dL (ref 0.6–1.1)
Glucose: 80 mg/dl (ref 70–140)
Potassium: 3.7 mEq/L (ref 3.5–5.1)
Sodium: 140 mEq/L (ref 136–145)
Total Bilirubin: 0.76 mg/dL (ref 0.20–1.20)
Total Protein: 6.9 g/dL (ref 6.4–8.3)

## 2013-07-24 MED ORDER — POTASSIUM CHLORIDE 20 MEQ/15ML (10%) PO LIQD: ORAL | Status: DC

## 2013-07-24 NOTE — Telephone Encounter (Signed)
gv ad printed appt sched and avs for pt for June...pt did not want to keep 6.12 appt

## 2013-07-24 NOTE — Telephone Encounter (Signed)
Left Message for dietician to call patient.

## 2013-07-24 NOTE — Progress Notes (Signed)
  [place] OFFICE PROGRESS NOTE   Diagnosis:  Metastatic melanoma.  INTERVAL HISTORY:   She returns as scheduled. Diarrhea overall is better. She continues to have 6-7 loose stools a day. The loose stools tend to occur after eating. She continues the "[employer]. She is on a prednisone taper per Dr. Harriet Masson. She will decrease the prednisone dose to 40 mg beginning today. She is not sleeping well. She periodically notes "thrush". She denies leg swelling. She has occasional pain at the right upper quadrant.  Objective:  Vital signs in last 24 hours:  Blood pressure 136/88, pulse 80, temperature 97.4 F (36.3 C), temperature source Oral, resp. rate 18, height $RemoveBe'5\' 3"'zuhMgZgUi$  (1.6 m), weight 158 lb 9.6 oz (71.94 kg), SpO2 100.00%.    HEENT: Question early thrush right posterior palate. Resp: Lungs clear. Cardio: Regular cardiac rhythm. GI: Abdomen soft and nontender. No hepatomegaly. Vascular: Trace bilateral pretibial edema. Calves nontender.   Lab Results:  Lab Results  Component Value Date   WBC 10.9* 07/24/2013   HGB 13.6 07/24/2013   HCT 41.9 07/24/2013   MCV 82.9 07/24/2013   PLT 251 07/24/2013   NEUTROABS 6.8* 07/24/2013    Imaging:  No results found.  Medications: I have reviewed the patient's current medications.  Assessment/Plan: 1.Metastatic melanoma  -Status post resection of a right arm melanoma, stage II A (T2b N0) in December 2009  -Metastatic melanoma, status post resection of a left cerebellar metastasis 09/30/2012, BRAF mutation identified  -Status post SRS treatment of the left cerebellum and a left parietal metastasis 10/24/2012  -Staging CTs and PET scan without evidence of distant metastatic disease aside from indeterminate small lung nodules  -Restaging CTs 12/01/2012 consistent with enlargement of the left parietal mass and right lower lobe lung nodule  -Initiation of dabrafenib and tremetinib 12/17/2012. She reports extremely poor tolerance.  Discontinued.  -Status post resection of a left parietal metastasis 02/09/2013.  -Restaging CT evaluation 03/03/2013. Stable 1.5 cm right lower lobe pulmonary nodule. Multiple peripheral enhancing lesions throughout the liver.  -Initiation of Ipilimumab at [facility] 03/10/2013.  -Cycle 2 Ipilumumab 04/17/2013  -Cycle 3 Ipilumumab 05/14/2013  -Cycle 4 Ipilumumab 06/04/2013  -Restaging CT at [facility] 06/25/2013 with multiple subcentimeter liver lesions, no new lesions, decreased right lower lobe nodule.  -Brain MRI 07/07/2013 with no evidence of progressive metastatic disease.  2. Arrhythmia, pacemaker in place.  3. Hypertension  4. History of a CVA  5. History of deep vein thrombosis  6. history of Proximal leg weakness-likely related to chronic steroid use.  7. Intermittent rash at the posterior thighs and left hand-likely related to systemic therapy.  8. leg edema-? Related to liver disease,? Secondary to ipilumumab . Improved on furosemide.  9. Headache and mild nausea-coincided with discontinuation of Decadron , CT of the brain 03/25/2011 with no acute change.  10. 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 She is on a prednisone taper.   Disposition: She appears stable. She will continue the prednisone taper as outlined by Dr. Harriet Masson. She will return for a followup visit in approximately 2 weeks. She knows to contact the office in the interim with any problems.  Plan reviewed with Dr. Benay Spice.    Owens Shark ANP/GNP-BC   07/24/2013  1:42 PM

## 2013-07-27 ENCOUNTER — Other Ambulatory Visit: Payer: Self-pay | Admitting: *Deleted

## 2013-07-27 DIAGNOSIS — C439 Malignant melanoma of skin, unspecified: Secondary | ICD-10-CM

## 2013-07-27 MED ORDER — NYSTATIN 100000 UNIT/ML MT SUSP: 5.0000 mL | Freq: Four times a day (QID) | OROMUCOSAL | Status: DC | PRN

## 2013-07-28 ENCOUNTER — Telehealth: Payer: Self-pay | Admitting: Oncology

## 2013-07-28 NOTE — Telephone Encounter (Signed)
s.w. pt and advised on June appt...pt ok adn aware...t wanted lab moved to this friday...done.Marland KitchenMarland KitchenMarland Kitchen

## 2013-07-29 ENCOUNTER — Telehealth: Payer: Self-pay | Admitting: *Deleted

## 2013-07-29 DIAGNOSIS — C439 Malignant melanoma of skin, unspecified: Secondary | ICD-10-CM

## 2013-07-29 NOTE — Telephone Encounter (Signed)
Encounter in error

## 2013-07-29 NOTE — Telephone Encounter (Signed)
Discussed schedule and she agrees to labs 07/31/13 and wants to do next lab on 08/07/13. Does not need to see Dr. Benay Spice on Friday unless condition changes. Still having diarrhea every time she eats and is still on BRAT diet. Prednisone continues at 40 mg daily. Ordered a supplement that is nondairy per dietician at [facility] called [employer] Plans to start this today.

## 2013-07-29 NOTE — Telephone Encounter (Signed)
Error

## 2013-07-29 NOTE — Telephone Encounter (Signed)
Left VM for patient that MD has no opening for this Friday to see her. If she is having new acute issues please call back and we can see if another midlevel can work her in to be seen if necessary. [facility] wants weekly labs until they see her again. Requested she call back or communicate via My Chart.

## 2013-07-29 NOTE — Telephone Encounter (Signed)
Left VM requesting Dr. Benay Spice see her on Friday when she comes in for labs. Feels that her schedule is "messed up" and wants to discuss this also. Dimitri Ped with Dr. Collichio's office to inquire what labs they need this week, since our office ordered labs for 6/15. Was told she needs weekly CBC, Cmet, LDH till they see her again. Orders placed.

## 2013-07-29 NOTE — Addendum Note (Signed)
Addended by: Tania Ade on: 07/29/2013 03:53 PM   Modules accepted: Orders

## 2013-07-30 ENCOUNTER — Ambulatory Visit
Admission: RE | Admit: 2013-07-30 | Discharge: 2013-07-30 | Disposition: A | Discharge status: Home / Self Care | Payer: BC Managed Care – PPO | Source: Ambulatory Visit | Attending: Radiation Oncology | Admitting: Radiation Oncology

## 2013-07-31 ENCOUNTER — Other Ambulatory Visit (HOSPITAL_BASED_OUTPATIENT_CLINIC_OR_DEPARTMENT_OTHER): Payer: BC Managed Care – PPO

## 2013-07-31 DIAGNOSIS — C439 Malignant melanoma of skin, unspecified: Secondary | ICD-10-CM

## 2013-07-31 DIAGNOSIS — C7949 Secondary malignant neoplasm of other parts of nervous system: Secondary | ICD-10-CM

## 2013-07-31 DIAGNOSIS — C7931 Secondary malignant neoplasm of brain: Secondary | ICD-10-CM

## 2013-07-31 DIAGNOSIS — K7689 Other specified diseases of liver: Secondary | ICD-10-CM

## 2013-07-31 DIAGNOSIS — C4360 Malignant melanoma of unspecified upper limb, including shoulder: Secondary | ICD-10-CM

## 2013-07-31 LAB — CBC WITH DIFFERENTIAL/PLATELET
BASO%: 0.3 % (ref 0.0–2.0)
Basophils Absolute: 0 10*3/uL (ref 0.0–0.1)
EOS%: 0.6 % (ref 0.0–7.0)
Eosinophils Absolute: 0.1 10*3/uL (ref 0.0–0.5)
HCT: 41.7 % (ref 34.8–46.6)
HGB: 14 g/dL (ref 11.6–15.9)
LYMPH%: 27.6 % (ref 14.0–49.7)
MCH: 27.6 pg (ref 25.1–34.0)
MCHC: 33.5 g/dL (ref 31.5–36.0)
MCV: 82.4 fL (ref 79.5–101.0)
MONO#: 1.1 10*3/uL — ABNORMAL HIGH (ref 0.1–0.9)
MONO%: 11.1 % (ref 0.0–14.0)
NEUT#: 5.8 10*3/uL (ref 1.5–6.5)
NEUT%: 60.4 % (ref 38.4–76.8)
Platelets: 233 10*3/uL (ref 145–400)
RBC: 5.06 10*6/uL (ref 3.70–5.45)
RDW: 16 % — ABNORMAL HIGH (ref 11.2–14.5)
WBC: 9.5 10*3/uL (ref 3.9–10.3)
lymph#: 2.6 10*3/uL (ref 0.9–3.3)

## 2013-07-31 LAB — COMPREHENSIVE METABOLIC PANEL (CC13)
ALT: 37 U/L (ref 0–55)
AST: 19 U/L (ref 5–34)
Albumin: 3.8 g/dL (ref 3.5–5.0)
Alkaline Phosphatase: 84 U/L (ref 40–150)
Anion Gap: 14 mEq/L — ABNORMAL HIGH (ref 3–11)
BUN: 25 mg/dL (ref 7.0–26.0)
CO2: 24 mEq/L (ref 22–29)
Calcium: 9.6 mg/dL (ref 8.4–10.4)
Chloride: 104 mEq/L (ref 98–109)
Creatinine: 0.9 mg/dL (ref 0.6–1.1)
Glucose: 78 mg/dl (ref 70–140)
Potassium: 3.9 mEq/L (ref 3.5–5.1)
Sodium: 143 mEq/L (ref 136–145)
Total Bilirubin: 0.64 mg/dL (ref 0.20–1.20)
Total Protein: 6.8 g/dL (ref 6.4–8.3)

## 2013-07-31 LAB — LACTATE DEHYDROGENASE (CC13): LDH: 281 U/L — ABNORMAL HIGH (ref 125–245)

## 2013-08-03 ENCOUNTER — Telehealth: Payer: Self-pay | Admitting: Oncology

## 2013-08-03 ENCOUNTER — Telehealth: Payer: Self-pay | Admitting: *Deleted

## 2013-08-03 NOTE — Telephone Encounter (Signed)
Pt see Nutritionist @ [facility], appt cancelled

## 2013-08-03 NOTE — Telephone Encounter (Signed)
Message from pt to clarify/ correct schedule. Already done by scheduler.

## 2013-08-03 NOTE — Telephone Encounter (Signed)
Pt called and cancelled nutrition and lab ,coumadin per pt rqst

## 2013-08-05 ENCOUNTER — Encounter: Payer: BC Managed Care – PPO | Admitting: Nutrition

## 2013-08-06 ENCOUNTER — Telehealth: Payer: Self-pay | Admitting: *Deleted

## 2013-08-06 NOTE — Telephone Encounter (Signed)
Needs to cancel her lab/OV on 6/12--going to [facility] and will see Dr. Harriet Masson.  Reschedule for 6/15 with Dr. Benay Spice is too soon-was told Dr. Harriet Masson wants her seen weekly with labs. Requesting 6/18 or 6/19 (MD out of office on 6/19). Made her aware MD will have to look at his schedule and advise.

## 2013-08-07 ENCOUNTER — Telehealth: Payer: Self-pay | Admitting: *Deleted

## 2013-08-07 ENCOUNTER — Other Ambulatory Visit: Payer: BC Managed Care – PPO

## 2013-08-07 ENCOUNTER — Ambulatory Visit: Payer: BC Managed Care – PPO | Admitting: Oncology

## 2013-08-07 NOTE — Telephone Encounter (Signed)
Per Dr. Benay Spice : unable to see patient on 6/18 or 6/19. Can be seen by Ned Card, NP either day. Left VM for Bhavika that she has been scheduled for 6/18 at 0945/1015 to see Ned Card (Dr. Benay Spice will be in the office if needed that am). Requested she return call to confirm.

## 2013-08-10 ENCOUNTER — Ambulatory Visit: Payer: BC Managed Care – PPO | Admitting: Oncology

## 2013-08-10 ENCOUNTER — Other Ambulatory Visit: Payer: BC Managed Care – PPO

## 2013-08-10 ENCOUNTER — Telehealth: Payer: Self-pay | Admitting: *Deleted

## 2013-08-10 NOTE — Telephone Encounter (Signed)
Left VM for patient to call back regarding her appointment. Noted office note from Dr. Harriet Masson from 4/08-[id number] taper and to return with scans on 6/30. IF not better, may begin Zelboraf. Noted she is down to 4-6 stools/day.

## 2013-08-10 NOTE — Telephone Encounter (Signed)
Left VM she needs to talk about her appointments after her Friday visit with Dr. Harriet Masson.

## 2013-08-11 ENCOUNTER — Telehealth: Payer: Self-pay | Admitting: *Deleted

## 2013-08-11 NOTE — Telephone Encounter (Signed)
Called to confirm she will keep the appointment with Ned Card with labs on 08/13/13. Gave extensive update to nurse in regards to her current status and concerns Dr. Harriet Masson has for her. Returns to [facility] on 6/30 with scans. Reports Dr. Harriet Masson is concerned about her LDH being higher at 898 and that her lymphocyte absolute count had gone down. Says could be due to steroids. She is asking nurse what MPV lab is? This RN is not aware, asked if she meant MCV and she said no.

## 2013-08-13 ENCOUNTER — Encounter: Payer: Self-pay | Admitting: Nutrition

## 2013-08-13 ENCOUNTER — Telehealth: Payer: Self-pay | Admitting: Nutrition

## 2013-08-13 ENCOUNTER — Other Ambulatory Visit (HOSPITAL_BASED_OUTPATIENT_CLINIC_OR_DEPARTMENT_OTHER): Payer: BC Managed Care – PPO

## 2013-08-13 ENCOUNTER — Other Ambulatory Visit: Payer: BC Managed Care – PPO

## 2013-08-13 ENCOUNTER — Telehealth: Payer: Self-pay | Admitting: Oncology

## 2013-08-13 ENCOUNTER — Ambulatory Visit: Payer: BC Managed Care – PPO

## 2013-08-13 ENCOUNTER — Ambulatory Visit (HOSPITAL_BASED_OUTPATIENT_CLINIC_OR_DEPARTMENT_OTHER): Payer: BC Managed Care – PPO | Admitting: Nurse Practitioner

## 2013-08-13 ENCOUNTER — Encounter: Payer: BC Managed Care – PPO | Admitting: Nutrition

## 2013-08-13 ENCOUNTER — Other Ambulatory Visit: Payer: Self-pay | Admitting: Internal Medicine

## 2013-08-13 VITALS — BP 115/79 | HR 73 | Temp 97.3°F | Resp 18 | Ht 63.0 in | Wt 157.0 lb

## 2013-08-13 DIAGNOSIS — C4360 Malignant melanoma of unspecified upper limb, including shoulder: Secondary | ICD-10-CM

## 2013-08-13 DIAGNOSIS — C799 Secondary malignant neoplasm of unspecified site: Secondary | ICD-10-CM

## 2013-08-13 DIAGNOSIS — R197 Diarrhea, unspecified: Secondary | ICD-10-CM

## 2013-08-13 DIAGNOSIS — C7949 Secondary malignant neoplasm of other parts of nervous system: Secondary | ICD-10-CM

## 2013-08-13 DIAGNOSIS — C439 Malignant melanoma of skin, unspecified: Secondary | ICD-10-CM

## 2013-08-13 DIAGNOSIS — R609 Edema, unspecified: Secondary | ICD-10-CM

## 2013-08-13 DIAGNOSIS — C7931 Secondary malignant neoplasm of brain: Secondary | ICD-10-CM

## 2013-08-13 DIAGNOSIS — R911 Solitary pulmonary nodule: Secondary | ICD-10-CM

## 2013-08-13 DIAGNOSIS — K7689 Other specified diseases of liver: Secondary | ICD-10-CM

## 2013-08-13 LAB — CBC WITH DIFFERENTIAL/PLATELET
BASO%: 0.6 % (ref 0.0–2.0)
Basophils Absolute: 0.1 10*3/uL (ref 0.0–0.1)
EOS%: 0.7 % (ref 0.0–7.0)
Eosinophils Absolute: 0.1 10*3/uL (ref 0.0–0.5)
HCT: 39.6 % (ref 34.8–46.6)
HGB: 13.1 g/dL (ref 11.6–15.9)
LYMPH%: 23.8 % (ref 14.0–49.7)
MCH: 28.1 pg (ref 25.1–34.0)
MCHC: 33.2 g/dL (ref 31.5–36.0)
MCV: 84.6 fL (ref 79.5–101.0)
MONO#: 1 10*3/uL — ABNORMAL HIGH (ref 0.1–0.9)
MONO%: 11.2 % (ref 0.0–14.0)
NEUT#: 5.9 10*3/uL (ref 1.5–6.5)
NEUT%: 63.7 % (ref 38.4–76.8)
Platelets: 216 10*3/uL (ref 145–400)
RBC: 4.68 10*6/uL (ref 3.70–5.45)
RDW: 17 % — ABNORMAL HIGH (ref 11.2–14.5)
WBC: 9.3 10*3/uL (ref 3.9–10.3)
lymph#: 2.2 10*3/uL (ref 0.9–3.3)

## 2013-08-13 LAB — LACTATE DEHYDROGENASE (CC13): LDH: 316 U/L — ABNORMAL HIGH (ref 125–245)

## 2013-08-13 LAB — COMPREHENSIVE METABOLIC PANEL (CC13)
ALT: 30 U/L (ref 0–55)
AST: 16 U/L (ref 5–34)
Albumin: 3.6 g/dL (ref 3.5–5.0)
Alkaline Phosphatase: 78 U/L (ref 40–150)
Anion Gap: 8 mEq/L (ref 3–11)
BUN: 21.7 mg/dL (ref 7.0–26.0)
CO2: 28 mEq/L (ref 22–29)
Calcium: 9.6 mg/dL (ref 8.4–10.4)
Chloride: 105 mEq/L (ref 98–109)
Creatinine: 0.8 mg/dL (ref 0.6–1.1)
Glucose: 77 mg/dl (ref 70–140)
Potassium: 3.4 mEq/L — ABNORMAL LOW (ref 3.5–5.1)
Sodium: 141 mEq/L (ref 136–145)
Total Bilirubin: 0.46 mg/dL (ref 0.20–1.20)
Total Protein: 6.6 g/dL (ref 6.4–8.3)

## 2013-08-13 NOTE — Telephone Encounter (Signed)
Received a message to contact patient by phone regarding questions about lactose-free oral nutrition supplements.  Patient was not at home but I left her a message to return my call.  I will call her on Friday June, 19th if I've not heard from her.

## 2013-08-13 NOTE — Progress Notes (Addendum)
[place] OFFICE PROGRESS NOTE   Diagnosis:  Metastatic melanoma.  INTERVAL HISTORY:   [name] returns as scheduled. She continues to have 4-6 diarrhea stools a day. She recently tried introducing some new foods. She noted a significant increase in the diarrhea. She has since resumed a "BRAT" diet. The majority of bowel movements tend to occur after eating. She continues on a prednisone taper. Current dose is 20 mg daily. She has mild intermittent pain at the right lower abdomen. She is expecting hemorrhoidal discomfort which she feels is secondary to the frequent stools.  Objective:  Vital signs in last 24 hours:  Blood pressure 115/79, pulse 73, temperature 97.3 F (36.3 C), temperature source Oral, resp. rate 18, height _0  (1.6 m), weight 157 lb (71.215 kg), SpO2 100.00%.    HEENT: No thrush or ulcerations. Resp: Lungs clear. Cardio: Regular cardiac rhythm. GI: Abdomen soft and nontender. No hepatomegaly. No mass. Vascular: Trace lower leg edema bilaterally. Calves nontender. Neuro: Alert and oriented.    Lab Results:  Lab Results  Component Value Date   WBC 9.3 08/13/2013   HGB 13.1 08/13/2013   HCT 39.6 08/13/2013   MCV 84.6 08/13/2013   PLT 216 08/13/2013   NEUTROABS 5.9 08/13/2013    Imaging:  No results found.  Medications: I have reviewed the patient's current medications.  Assessment/Plan: 1.Metastatic melanoma  -Status post resection of a right arm melanoma, stage II A (T2b N0) in December 2009  -Metastatic melanoma, status post resection of a left cerebellar metastasis 09/30/2012, BRAF mutation identified  -Status post SRS treatment of the left cerebellum and a left parietal metastasis 10/24/2012  -Staging CTs and PET scan without evidence of distant metastatic disease aside from indeterminate small lung nodules  -Restaging CTs 12/01/2012 consistent with enlargement of the left parietal mass and right lower lobe lung nodule    -Initiation of dabrafenib and tremetinib 12/17/2012. She reports extremely poor tolerance. Discontinued.  -Status post resection of a left parietal metastasis 02/09/2013.  -Restaging CT evaluation 03/03/2013. Stable 1.5 cm right lower lobe pulmonary nodule. Multiple peripheral enhancing lesions throughout the liver.  -Initiation of Ipilimumab at [facility] 03/10/2013.  -Cycle 2 Ipilumumab 04/17/2013  -Cycle 3 Ipilumumab 05/14/2013  -Cycle 4 Ipilumumab 06/04/2013  -Restaging CT at [facility] 06/25/2013 with multiple subcentimeter liver lesions, no new lesions, decreased right lower lobe nodule.  -Brain MRI 07/07/2013 with no evidence of progressive metastatic disease.  2. Arrhythmia, pacemaker in place.  3. Hypertension  4. History of a CVA  5. History of deep vein thrombosis  6. History of proximal leg weakness-likely related to chronic steroid use.  7. Intermittent rash at the posterior thighs and left hand-likely related to systemic therapy.  8. Leg edema-? Related to liver disease,? Secondary to ipilumumab . Improved on furosemide. Currently not taking furosemide. 9. Headache and mild nausea-coincided with discontinuation of Decadron, CT of the brain 03/25/2011 with no acute change.  10. 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 She is on a prednisone taper.    Disposition: She has persistent diarrhea. She continues a prednisone taper as outlined by Dr. Harriet Masson. Dr. Benay Spice recommended she ask Dr. Harriet Masson if she can try Imodium or Lomotil.  She is scheduled to return to [facility] on 08/25/2013 for scans and an office visit. She will return for a followup visit here on 09/09/2013. She will contact the office the interim with any problems.  Patient seen with Dr. Benay Spice.    [name] ANP/GNP-BC   08/13/2013  1:20 PM  This was a shared visit with [name].  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  She appears stable. We will see her after the restaging  evaluation at [facility].  Julieanne Manson, MD

## 2013-08-13 NOTE — Telephone Encounter (Signed)
gv and printed appt sched and avs fo rpt for July °

## 2013-08-13 NOTE — Telephone Encounter (Signed)
Patient returned my phone call.  Patient reports she is having severe diarrhea after chemotherapy.  She has been following a very low residue diet and consuming mostly carbohydrates.  Patient reports she has attempted to add in some different foods, but tried salmon last night and had severe diarrhea.  Patient seeking diet information.  I will e-mail copy of a diet as well as some information on a product called "banatrol" which are banana flakes to help with diarrhea.  Patient expresses appreciation.  Will contact me for further concerns.

## 2013-08-17 ENCOUNTER — Telehealth: Payer: Self-pay | Admitting: Nutrition

## 2013-08-17 NOTE — Telephone Encounter (Signed)
Patient had questions so I returned her call.  Patient is doing a little better.  States stool is thickening but she continues to have 6-7 stools daily.  Continues to follow a BRAT diet.  Taking a probiotic.  Patient will continue to contact me if further questions or concerns as needed.

## 2013-08-27 ENCOUNTER — Emergency Department (HOSPITAL_COMMUNITY): Payer: Medicare Other

## 2013-08-27 ENCOUNTER — Ambulatory Visit: Payer: BC Managed Care – PPO

## 2013-08-27 ENCOUNTER — Encounter (HOSPITAL_COMMUNITY): Payer: Self-pay | Admitting: Emergency Medicine

## 2013-08-27 ENCOUNTER — Emergency Department (HOSPITAL_COMMUNITY)
Admission: EM | Admit: 2013-08-27 | Discharge: 2013-08-27 | Disposition: A | Discharge status: Home / Self Care | Payer: Medicare Other | Attending: Emergency Medicine | Admitting: Emergency Medicine

## 2013-08-27 DIAGNOSIS — Z950 Presence of cardiac pacemaker: Secondary | ICD-10-CM | POA: Diagnosis not present

## 2013-08-27 DIAGNOSIS — C7800 Secondary malignant neoplasm of unspecified lung: Secondary | ICD-10-CM | POA: Diagnosis not present

## 2013-08-27 DIAGNOSIS — Z8739 Personal history of other diseases of the musculoskeletal system and connective tissue: Secondary | ICD-10-CM | POA: Insufficient documentation

## 2013-08-27 DIAGNOSIS — E119 Type 2 diabetes mellitus without complications: Secondary | ICD-10-CM | POA: Diagnosis not present

## 2013-08-27 DIAGNOSIS — R109 Unspecified abdominal pain: Secondary | ICD-10-CM | POA: Diagnosis not present

## 2013-08-27 DIAGNOSIS — R000 Tachycardia, unspecified: Secondary | ICD-10-CM | POA: Insufficient documentation

## 2013-08-27 DIAGNOSIS — C7931 Secondary malignant neoplasm of brain: Secondary | ICD-10-CM | POA: Insufficient documentation

## 2013-08-27 DIAGNOSIS — C787 Secondary malignant neoplasm of liver and intrahepatic bile duct: Secondary | ICD-10-CM | POA: Insufficient documentation

## 2013-08-27 DIAGNOSIS — E785 Hyperlipidemia, unspecified: Secondary | ICD-10-CM | POA: Diagnosis not present

## 2013-08-27 DIAGNOSIS — Z8673 Personal history of transient ischemic attack (TIA), and cerebral infarction without residual deficits: Secondary | ICD-10-CM | POA: Diagnosis not present

## 2013-08-27 DIAGNOSIS — K2970 Gastritis, unspecified, without bleeding: Secondary | ICD-10-CM | POA: Diagnosis not present

## 2013-08-27 DIAGNOSIS — M5380 Other specified dorsopathies, site unspecified: Secondary | ICD-10-CM | POA: Insufficient documentation

## 2013-08-27 DIAGNOSIS — R197 Diarrhea, unspecified: Secondary | ICD-10-CM | POA: Diagnosis not present

## 2013-08-27 DIAGNOSIS — J45909 Unspecified asthma, uncomplicated: Secondary | ICD-10-CM | POA: Diagnosis not present

## 2013-08-27 DIAGNOSIS — Z3202 Encounter for pregnancy test, result negative: Secondary | ICD-10-CM | POA: Diagnosis not present

## 2013-08-27 DIAGNOSIS — R112 Nausea with vomiting, unspecified: Secondary | ICD-10-CM | POA: Insufficient documentation

## 2013-08-27 DIAGNOSIS — Z923 Personal history of irradiation: Secondary | ICD-10-CM | POA: Diagnosis not present

## 2013-08-27 DIAGNOSIS — C7949 Secondary malignant neoplasm of other parts of nervous system: Secondary | ICD-10-CM

## 2013-08-27 DIAGNOSIS — K5289 Other specified noninfective gastroenteritis and colitis: Secondary | ICD-10-CM | POA: Diagnosis not present

## 2013-08-27 DIAGNOSIS — Z86718 Personal history of other venous thrombosis and embolism: Secondary | ICD-10-CM | POA: Diagnosis not present

## 2013-08-27 DIAGNOSIS — Z87828 Personal history of other (healed) physical injury and trauma: Secondary | ICD-10-CM | POA: Diagnosis not present

## 2013-08-27 DIAGNOSIS — Z79899 Other long term (current) drug therapy: Secondary | ICD-10-CM | POA: Insufficient documentation

## 2013-08-27 DIAGNOSIS — I10 Essential (primary) hypertension: Secondary | ICD-10-CM | POA: Insufficient documentation

## 2013-08-27 DIAGNOSIS — Z880 Allergy status to penicillin: Secondary | ICD-10-CM | POA: Diagnosis not present

## 2013-08-27 DIAGNOSIS — IMO0002 Reserved for concepts with insufficient information to code with codable children: Secondary | ICD-10-CM | POA: Diagnosis not present

## 2013-08-27 DIAGNOSIS — C439 Malignant melanoma of skin, unspecified: Secondary | ICD-10-CM | POA: Diagnosis not present

## 2013-08-27 DIAGNOSIS — K529 Noninfective gastroenteritis and colitis, unspecified: Secondary | ICD-10-CM

## 2013-08-27 LAB — CBC WITH DIFFERENTIAL/PLATELET
Basophils Absolute: 0 10*3/uL (ref 0.0–0.1)
Basophils Relative: 0 % (ref 0–1)
Eosinophils Absolute: 0 10*3/uL (ref 0.0–0.7)
Eosinophils Relative: 0 % (ref 0–5)
HCT: 44.7 % (ref 36.0–46.0)
Hemoglobin: 15.1 g/dL — ABNORMAL HIGH (ref 12.0–15.0)
Lymphocytes Relative: 20 % (ref 12–46)
Lymphs Abs: 1.7 10*3/uL (ref 0.7–4.0)
MCH: 28.9 pg (ref 26.0–34.0)
MCHC: 33.8 g/dL (ref 30.0–36.0)
MCV: 85.6 fL (ref 78.0–100.0)
Monocytes Absolute: 0.5 10*3/uL (ref 0.1–1.0)
Monocytes Relative: 6 % (ref 3–12)
Neutro Abs: 6.3 10*3/uL (ref 1.7–7.7)
Neutrophils Relative %: 74 % (ref 43–77)
Platelets: 274 10*3/uL (ref 150–400)
RBC: 5.22 MIL/uL — ABNORMAL HIGH (ref 3.87–5.11)
RDW: 15.6 % — ABNORMAL HIGH (ref 11.5–15.5)
WBC: 8.5 10*3/uL (ref 4.0–10.5)

## 2013-08-27 LAB — COMPREHENSIVE METABOLIC PANEL
ALT: 35 U/L (ref 0–35)
AST: 25 U/L (ref 0–37)
Albumin: 4.7 g/dL (ref 3.5–5.2)
Alkaline Phosphatase: 96 U/L (ref 39–117)
Anion gap: 17 — ABNORMAL HIGH (ref 5–15)
BUN: 18 mg/dL (ref 6–23)
CO2: 23 mEq/L (ref 19–32)
Calcium: 10.9 mg/dL — ABNORMAL HIGH (ref 8.4–10.5)
Chloride: 101 mEq/L (ref 96–112)
Creatinine, Ser: 0.94 mg/dL (ref 0.50–1.10)
GFR calc Af Amer: 78 mL/min — ABNORMAL LOW (ref >90)
GFR calc non Af Amer: 67 mL/min — ABNORMAL LOW (ref >90)
Glucose, Bld: 105 mg/dL — ABNORMAL HIGH (ref 70–99)
Potassium: 3.3 mEq/L — ABNORMAL LOW (ref 3.7–5.3)
Sodium: 141 mEq/L (ref 137–147)
Total Bilirubin: 0.4 mg/dL (ref 0.3–1.2)
Total Protein: 9.1 g/dL — ABNORMAL HIGH (ref 6.0–8.3)

## 2013-08-27 LAB — URINE MICROSCOPIC-ADD ON

## 2013-08-27 LAB — URINALYSIS, ROUTINE W REFLEX MICROSCOPIC
Glucose, UA: NEGATIVE mg/dL
Hgb urine dipstick: NEGATIVE
Ketones, ur: NEGATIVE mg/dL
Nitrite: NEGATIVE
Protein, ur: 30 mg/dL — AB
Specific Gravity, Urine: 1.027 (ref 1.005–1.030)
Urobilinogen, UA: 0.2 mg/dL (ref 0.0–1.0)
pH: 5.5 (ref 5.0–8.0)

## 2013-08-27 LAB — TROPONIN I: Troponin I: <0.3 ng/mL (ref <0.30)

## 2013-08-27 LAB — POC URINE PREG, ED: Preg Test, Ur: NEGATIVE

## 2013-08-27 LAB — LIPASE, BLOOD: Lipase: 104 U/L — ABNORMAL HIGH (ref 11–59)

## 2013-08-27 MED ORDER — SODIUM CHLORIDE 0.9 % IV BOLUS (SEPSIS)
500.0000 mL | Freq: Once | INTRAVENOUS | Status: AC
  Administered 2013-08-27: 500 mL via INTRAVENOUS

## 2013-08-27 MED ORDER — IOHEXOL 300 MG/ML  SOLN
100.0000 mL | Freq: Once | INTRAMUSCULAR | Status: AC | PRN
  Administered 2013-08-27: 100 mL via INTRAVENOUS

## 2013-08-27 MED ORDER — ONDANSETRON HCL 4 MG/2ML IJ SOLN
4.0000 mg | Freq: Once | INTRAMUSCULAR | Status: AC
  Administered 2013-08-27: 4 mg via INTRAVENOUS
  Filled 2013-08-27: qty 2

## 2013-08-27 MED ORDER — IOHEXOL 300 MG/ML  SOLN
50.0000 mL | Freq: Once | INTRAMUSCULAR | Status: AC | PRN
  Administered 2013-08-27: 50 mL via ORAL

## 2013-08-27 MED ORDER — ONDANSETRON 4 MG PO TBDP: ORAL_TABLET | ORAL | Status: DC

## 2013-08-27 NOTE — ED Notes (Signed)
Patient given ice chips per Dr Darl Householder

## 2013-08-27 NOTE — ED Notes (Signed)
Bed: WA21 Expected date:  Expected time:  Means of arrival:  Comments: EMS-N/V-cancer pt

## 2013-08-27 NOTE — Discharge Instructions (Signed)
Please call your doctor for a followup appointment within 24-48 hours. When you talk to your doctor please let them know that you were seen in the emergency department and have them acquire all of your records so that they can discuss the findings with you and formulate a treatment plan to fully care for your new and ongoing problems. Please call and set-up an appointment with your primary care provider to be seen and re-assessed Please rest and stay hydrated Please avoid any physical or strenuous activity Please continue to stick with a clear diet, BRAT diet Please continue to monitor symptoms closely and if symptoms are to worsen or change (fever greater than 101, chills, sweating, nausea, vomiting, chest pain, shortness of breath, difficulty breathing, abdominal pain, worsening or changes to pain pattern, blood in the stool, black tarry stool, blood in the urine, decreased urination) please report back to the ED immediately    Colitis Colitis is inflammation of the colon. Colitis can be a short-term or long-standing (chronic) illness. Crohn's disease and ulcerative colitis are 2 types of colitis which are chronic. They usually require lifelong treatment. CAUSES  There are many different causes of colitis, including:  Viruses.  Germs (bacteria).  Medicine reactions. SYMPTOMS   Diarrhea.  Intestinal bleeding.  Pain.  Fever.  Throwing up (vomiting).  Tiredness (fatigue).  Weight loss.  Bowel blockage. DIAGNOSIS  The diagnosis of colitis is based on examination and stool or blood tests. X-rays, CT scan, and colonoscopy may also be needed. TREATMENT  Treatment may include:  Fluids given through the vein (intravenously).  Bowel rest (nothing to eat or drink for a period of time).  Medicine for pain and diarrhea.  Medicines (antibiotics) that kill germs.  Cortisone medicines.  Surgery. HOME CARE INSTRUCTIONS   Get plenty of rest.  Drink enough water and fluids to  keep your urine clear or pale yellow.  Eat a well-balanced diet.  Call your caregiver for follow-up as recommended. SEEK IMMEDIATE MEDICAL CARE IF:   You develop chills.  You have an oral temperature above 102 F (38.9 C), not controlled by medicine.  You have extreme weakness, fainting, or dehydration.  You have repeated vomiting.  You develop severe belly (abdominal) pain or are passing bloody or tarry stools. MAKE SURE YOU:   Understand these instructions.  Will watch your condition.  Will get help right away if you are not doing well or get worse. Document Released: 03/22/2004 Document Revised: 05/07/2011 Document Reviewed: 06/17/2009 [facility] Patient Information 2015 [place], [place]. This information is not intended to replace advice given to you by your health care provider. Make sure you discuss any questions you have with your health care provider.

## 2013-08-27 NOTE — ED Notes (Signed)
Pt aware that we need urine sample.

## 2013-08-27 NOTE — ED Notes (Signed)
Pt still unable to void at this time 

## 2013-08-27 NOTE — ED Provider Notes (Signed)
CSN: [id number]     Arrival date & time 08/27/13  1600 History   First MD Initiated Contact with Patient 08/27/13 1607     Chief Complaint  Patient presents with  . Nausea  . Emesis     (Consider location/radiation/quality/duration/timing/severity/associated sxs/prior Treatment) The history is provided by the patient. No language interpreter was used.  [name] is a 58 y/o F with PMHx of hypoglycemia, A fib with PM placement in 2012, CVA, DVT, HLD, HTN, stroke, Melanoma with mets to brain, liver, and lung presenting to the ED with abdominal pain, nausea, vomiting and diarrhea that started today at approximately 12:15 PM after eating lunch. Patient reported that when she finished her Uvion treatment on Jul 04, 2013 she started to develop a sever colitis and has been on prednisone for approximately one year - stated that she was started on 75 mg and has now been tapered down to 10 mg. Stated that she is being followed by Oncology in [facility] - recently seen on 08/25/2013 for follow-up. Reported that after eating at [facility] she started to have nausea, vomiting, and diarrhea. Reported at least 8 episodes of loose stools - brown color and at least 4 episodes of emesis. Stated that she has been unable to keep any food or fluid down. Stated that she started to experience right lower quadrant abdominal pain onset after eating as well and worsening with emesis. Denied fever, chest pain, shortness of breath, difficulty breathing, numbness, tingling, hematuria, urinary issues, neck pain, back pain, decreased flatulence. PCP Dr. Regis Bill Oncology Dr. Ermalene Searing   Past Medical History  Diagnosis Date  . 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 Hypoglycemia, unspecified   . Other nonspecific abnormal serum enzyme levels   . Cervicalgia   . Disturbance of skin sensation   . Other specified congenital anomalies of nervous system   . Swelling of limb   . Disorder of bone and  cartilage, unspecified   . Atrial tachycardia   . CVA (cerebral infarction)     2012 with dizziness and vision change felt embolic from atrial tachy  . History of pacemaker   . POTS (postural orthostatic tachycardia syndrome)   . Diverticulosis   . Osteopenia   . DVT (deep venous thrombosis)   . Atrial fibrillation   . DM (diabetes mellitus)   . DVT (deep venous thrombosis)   . HLD (hyperlipidemia)   . HTN (hypertension)   . Pacemaker     autonomic dysfunction  . Complication of anesthesia     pt states wakes up with "shakes"  . PONV (postoperative nausea and vomiting)   . Stroke     left weaker   . History of radiation therapy 10/24/12    brain  . Skin cancer     Hx: of lung lesion  . Family history of anesthesia complication     PONV  . Asthma    Past Surgical History  Procedure Laterality Date  . Insert / replace / remove pacemaker  2012    1999, x 3  . Ablation saphenous vein w/ rfa      2002  . Tonsillectomy and adenoidectomy    . Carpal tunnel release      left  . Nose surgery  2010, 2012    for nose bleeds x 2  . Fatty tumor removed      from chest  . Melanoma excision      with removal of  lymph nodes, left shoulder  . Deep axillary sentinel node biopsy / excision      due to extensive Melanoma-right arm  . Laparoscopy  09/06/2011    Procedure: LAPAROSCOPY OPERATIVE;  Surgeon: Claiborne Billings A. Pamala Hurry, MD;  Location: [facility];  Service: Gynecology;  Laterality: Left;  with Left Ovarian Cystectomy   . Craniotomy N/A 09/30/2012    suboccipital craniectomy  . Craniotomy Left 02/09/2013    Procedure: LEFT PARIETAL CRANIOTOMY with stealth;  Surgeon: [name], MD;  Location: [place];  Service: Neurosurgery;  Laterality: Left;  LEFT Parietal Craniotomy for tumor with stealth   Family History  Problem Relation Age of Onset  . Heart disease Father   . Diabetes Maternal Grandmother   . Diabetes Paternal Grandmother   . Stroke Mother   . Colon polyps Mother   .  Clotting disorder Maternal Grandmother     stroke  . Crohn's disease Maternal Grandmother   . Diabetes Father   . Kidney disease Father    History  Substance Use Topics  . Smoking status: Never Smoker   . Smokeless tobacco: Never Used  . Alcohol Use: No     Comment: glass of wine daily   OB History   Grav Para Term Preterm Abortions TAB SAB Ect Mult Living                 Review of Systems  Constitutional: Negative for fever and chills.  Eyes: Negative for visual disturbance.  Respiratory: Negative for chest tightness and shortness of breath.   Cardiovascular: Negative for chest pain.  Gastrointestinal: Positive for nausea, vomiting, abdominal pain and diarrhea. Negative for constipation, blood in stool and anal bleeding.  Musculoskeletal: Negative for back pain.  Neurological: Negative for dizziness, weakness and headaches.      Allergies  Doxycycline; Erythromycin; Penicillins; Preparation h; and Sulfonamide derivatives  Home Medications   Prior to Admission medications   Medication Sig Start Date End Date Taking? Authorizing Provider  acetaminophen (TYLENOL) 500 MG tablet Take 1,000 mg by mouth every 6 (six) hours as needed for pain.   Yes Historical Provider, MD  albuterol (VENTOLIN HFA) 108 (90 BASE) MCG/ACT inhaler Inhale 2 puffs into the lungs as needed. 01/19/13  Yes Historical Provider, MD  clonazePAM (KLONOPIN) 0.5 MG tablet Take 1 tablet (0.5 mg total) by mouth 3 (three) times daily as needed for anxiety. 12/01/12  Yes Marye Round, MD  cloNIDine (CATAPRES) 0.1 MG tablet Take 1 tablet (0.1 mg total) by mouth at bedtime.   Yes Fay Records, MD  cyanocobalamin (,VITAMIN B-12,) 1000 MCG/ML injection INJECT 1 ML UNDER THE SKIN AS DIRECTED ONCE A MONTH   Yes Fay Records, MD  diphenhydrAMINE (BENADRYL) 25 MG tablet Take 12.5 mg by mouth every 6 (six) hours as needed. Seasonal allergies   Yes Historical Provider, MD  furosemide (LASIX) 40 MG tablet Take 40 mg by  mouth as needed for fluid or edema.  03/26/13  Yes Fay Records, MD  HYDROcodone-acetaminophen (NORCO) 5-325 MG per tablet Take 1 tablet by mouth every 6 (six) hours as needed for moderate pain. 02/11/13  Yes [name], MD  hydrocortisone (ANUSOL-HC) 25 MG suppository Place 25 mg rectally at bedtime as needed for hemorrhoids.   Yes Historical Provider, MD  hydrocortisone 2.5 % cream Apply 1 application topically 2 (two) times daily as needed (irritation).  03/31/13  Yes Historical Provider, MD  hydrocortisone-pramoxine [facility]) 2.5-1 % rectal cream Place 1 application rectally at  bedtime as needed for hemorrhoids.   Yes Historical Provider, MD  levETIRAcetam (KEPPRA) 500 MG tablet Take 500 mg by mouth 2 (two) times daily.   Yes Historical Provider, MD  mesalamine (CANASA) 1000 MG suppository Place 1,000 mg rectally at bedtime as needed (hemorrhoids).    Yes Historical Provider, MD  metFORMIN (GLUCOPHAGE) 500 MG tablet Take 500 mg by mouth daily after breakfast. 12/25/12  Yes Historical Provider, MD  metoprolol tartrate (LOPRESSOR) 25 MG tablet Take 25 mg by mouth 4 (four) times daily.   Yes Historical Provider, MD  NEXIUM 40 MG capsule TAKE ONE CAPSULE BY MOUTH TWICE DAILY BEFORE A MEAL 04/29/13  Yes Marye Round, MD  nystatin (MYCOSTATIN) 100000 UNIT/ML suspension Take 5 mLs (500,000 Units total) by mouth 4 (four) times daily as needed. 07/27/13  Yes [name], MD  ondansetron (ZOFRAN) 8 MG tablet Take 8 mg by mouth every 8 (eight) hours as needed for nausea.    Yes Historical Provider, MD  ondansetron (ZOFRAN-ODT) 4 MG disintegrating tablet Take 4 mg by mouth every 8 (eight) hours as needed for nausea.    Yes Historical Provider, MD  polyethylene glycol powder (GLYCOLAX/MIRALAX) powder Stir 17 grams in 8oz of water, or juice, 1 or 2 times daily for constipation  03/27/13  Yes Lafayette Dragon, MD  potassium chloride 20 MEQ/15ML (10%) solution Take 15 mLs (20 mEq total) by mouth 3 times daily  07/24/13  Yes Owens Shark, NP  predniSONE (DELTASONE) 10 MG tablet Take 10 mg by mouth daily with breakfast.   Yes Historical Provider, MD  rosuvastatin (CRESTOR) 5 MG tablet Take 5 mg by mouth daily.   Yes Historical Provider, MD  TGT ANTACID ANTI-GAS 200-200-20 MG/5ML suspension  08/08/13  Yes Historical Provider, MD  Vitamin D, Ergocalciferol, (DRISDOL) 50000 UNITS CAPS capsule Take 50,000 Units by mouth every 7 (seven) days.   Yes Historical Provider, MD  ondansetron (ZOFRAN ODT) 4 MG disintegrating tablet 4mg  ODT q4 hours prn nausea/vomit 08/27/13   Qiara Minetti, PA-C   BP 92/71  Pulse 95  Temp(Src) 97.3 F (36.3 C) (Oral)  Resp 21  SpO2 98% Physical Exam  Nursing note and vitals reviewed. Constitutional: She is oriented to person, place, and time. She appears well-developed and well-nourished. No distress.  HENT:  Head: Normocephalic and atraumatic.  Mouth/Throat: Oropharynx is clear and moist. No oropharyngeal exudate.  Eyes: Conjunctivae and EOM are normal. Pupils are equal, round, and reactive to light. Right eye exhibits no discharge. Left eye exhibits no discharge.  Neck: Normal range of motion. Neck supple. No tracheal deviation present.  Negative neck stiffness Negative nuchal rigidity  Negative cervical lymphadenopathy   Cardiovascular: Normal rate, regular rhythm and normal heart sounds.   Pulmonary/Chest: Effort normal and breath sounds normal. No respiratory distress. She has no wheezes. She has no rales.  Abdominal: Soft. Bowel sounds are normal. She exhibits no distension. There is no tenderness. There is no rebound and no guarding.  Negative abdominal distension  BS normoactive in all 4 quadrants Abdomen soft upon palpation  Negative guarding or rigidity Negative peritoneal signs   Musculoskeletal: Normal range of motion. She exhibits no edema and no tenderness.  Full ROM to upper and lower extremities without difficulty noted, negative ataxia noted.   Lymphadenopathy:    She has no cervical adenopathy.  Neurological: She is alert and oriented to person, place, and time. No cranial nerve deficit. She exhibits normal muscle tone. Coordination normal.  Cranial nerves  III-XII grossly intact Strength 5+/5+ to upper and lower extremities bilaterally with resistance applied, equal distribution noted Equal grip strength GCS 15 Negative facial drooping Negative slurred speech Negative aphasia  Skin: Skin is warm and dry. No rash noted. She is not diaphoretic. No erythema.  Psychiatric: She has a normal mood and affect. Her behavior is normal. Thought content normal.    ED Course  Procedures (including critical care time)  [id number] UN 08/25/13  11:04:[id number] [facility]) : CT CHEST W CONTRAST [id number] UN 08/25/13  11:04:[id number] [facility]) : CT ABDOMEN PELVIS W CONTRAST  INTERPRETATION LOCATION:  [place]  DICTATION : 08/25/13  12:15:11  CLINICAL: 58 year-old F.  172.9 - Melanoma172.9 - Melanoma.  TECHNIQUE:  Contiguous axial 5-mm images were obtained from the lung bases through the symphysis pubis during the administration of IV contrast.  For all [facility] CT exams, radiation dose reduction device (automated exposure control) is used or manual techniques with radiation dose As Low As Reasonably Achievable (ALARA) protocol are followed using age and patient-size-specific scan parameters, while maintaining the necessary diagnostic image quality.  COMPARISON:  07/01/13    FINDINGS:  CHEST: The thyroid is unremarkable. Three-vessel aortic arch is identified. The main pulmonary artery and ascending aorta are normal in caliber. Heart is normal in size without pericardial effusion. Pacer leads are identified within the right atrium and right ventricle.  No mediastinal, hilar, or axillary adenopathy is identified. Small to moderate sized hiatal hernia is noted.  Tracheobronchial tree is patent. 4 mm right lower lobe pulmonary nodule is unchanged  (2:29). No pleural effusion is seen.  ABDOMEN/PELVIS: The liver demonstrates a normal size and contour. 9 mm hypodensity in segment IVA is unchanged. 8 mm hypodensity within segment VI/VII is also unchanged. Focal fat is noted along the falciform ligament. Hepatic and portal veins are patent. Gallbladder is physiologically distended without cholelithiasis. No biliary duct dilatation is seen. The spleen is normal in size. Adrenal glands and pancreas are unremarkable. The kidneys demonstrate symmetric cortical enhancement bilaterally without evidence of nephrolithiasis or hydronephrosis.  The aorta, celiac, SMA, and IMA are patent and of normal caliber. No free fluid or free air is identified. No pathologically enlarged adenopathy is seen.  The bowel demonstrates a normal caliber and is without evidence of bowel wall obstruction. There is mild diffuse colonic wall thickening of the sigmoid colon without significant surrounding inflammatory stranding.  The uterus is atrophic. Left ovary is not visualized, also likely surgically absent. There is a 1.2 cm cystic lesion along the right pelvic sidewall, which may be ovarian in etiology and is grossly unchanged from prior examination. Bladder is unremarkable.  No worrisome osseous lesions are identified.  IMPRESSION:  1. No new sites of metastatic disease. Grossly stable metastases to the liver and unchanged 4 mm right lower lobe pulmonary nodule. 2. Mild hyperenhancement of the sigmoid colon wall without evidence of significant pericolonic stranding. Findings may represent mild colitis in the appropriate clinical setting. 3. Small to moderate-sized hiatal hernia.   Results for orders placed during the hospital encounter of 08/27/13  CBC WITH DIFFERENTIAL      Result Value Ref Range   WBC 8.5  4.0 - 10.5 K/uL   RBC 5.22 (*) 3.87 - 5.11 MIL/uL   Hemoglobin 15.1 (*) 12.0 - 15.0 g/dL   HCT 44.7  36.0 - 46.0 %   MCV 85.6  78.0 - 100.0 fL   MCH 28.9   26.0 - 34.0 pg   MCHC 33.8  30.0 - 36.0 g/dL  RDW 15.6 (*) 11.5 - 15.5 %   Platelets 274  150 - 400 K/uL   Neutrophils Relative % 74  43 - 77 %   Neutro Abs 6.3  1.7 - 7.7 K/uL   Lymphocytes Relative 20  12 - 46 %   Lymphs Abs 1.7  0.7 - 4.0 K/uL   Monocytes Relative 6  3 - 12 %   Monocytes Absolute 0.5  0.1 - 1.0 K/uL   Eosinophils Relative 0  0 - 5 %   Eosinophils Absolute 0.0  0.0 - 0.7 K/uL   Basophils Relative 0  0 - 1 %   Basophils Absolute 0.0  0.0 - 0.1 K/uL  COMPREHENSIVE METABOLIC PANEL      Result Value Ref Range   Sodium 141  137 - 147 mEq/L   Potassium 3.3 (*) 3.7 - 5.3 mEq/L   Chloride 101  96 - 112 mEq/L   CO2 23  19 - 32 mEq/L   Glucose, Bld 105 (*) 70 - 99 mg/dL   BUN 18  6 - 23 mg/dL   Creatinine, Ser 0.94  0.50 - 1.10 mg/dL   Calcium 10.9 (*) 8.4 - 10.5 mg/dL   Total Protein 9.1 (*) 6.0 - 8.3 g/dL   Albumin 4.7  3.5 - 5.2 g/dL   AST 25  0 - 37 U/L   ALT 35  0 - 35 U/L   Alkaline Phosphatase 96  39 - 117 U/L   Total Bilirubin 0.4  0.3 - 1.2 mg/dL   GFR calc non Af Amer 67 (*) >90 mL/min   GFR calc Af Amer 78 (*) >90 mL/min   Anion gap 17 (*) 5 - 15  LIPASE, BLOOD      Result Value Ref Range   Lipase 104 (*) 11 - 59 U/L  URINALYSIS, ROUTINE W REFLEX MICROSCOPIC      Result Value Ref Range   Color, Urine AMBER (*) YELLOW   APPearance CLOUDY (*) CLEAR   Specific Gravity, Urine 1.027  1.005 - 1.030   pH 5.5  5.0 - 8.0   Glucose, UA NEGATIVE  NEGATIVE mg/dL   Hgb urine dipstick NEGATIVE  NEGATIVE   Bilirubin Urine SMALL (*) NEGATIVE   Ketones, ur NEGATIVE  NEGATIVE mg/dL   Protein, ur 30 (*) NEGATIVE mg/dL   Urobilinogen, UA 0.2  0.0 - 1.0 mg/dL   Nitrite NEGATIVE  NEGATIVE   Leukocytes, UA SMALL (*) NEGATIVE  TROPONIN I      Result Value Ref Range   Troponin I <0.30  <0.30 ng/mL  URINE MICROSCOPIC-ADD ON      Result Value Ref Range   Squamous Epithelial / LPF RARE  RARE   WBC, UA 3-6  <3 WBC/hpf   RBC / HPF 0-2  <3 RBC/hpf   Bacteria, UA FEW  (*) RARE   Casts HYALINE CASTS (*) NEGATIVE   Crystals CA OXALATE CRYSTALS (*) NEGATIVE  POC URINE PREG, ED      Result Value Ref Range   Preg Test, Ur NEGATIVE  NEGATIVE    Labs Review Labs Reviewed  CBC WITH DIFFERENTIAL - Abnormal; Notable for the following:    RBC 5.22 (*)    Hemoglobin 15.1 (*)    RDW 15.6 (*)    All other components within normal limits  COMPREHENSIVE METABOLIC PANEL - Abnormal; Notable for the following:    Potassium 3.3 (*)    Glucose, Bld 105 (*)    Calcium 10.9 (*)  Total Protein 9.1 (*)    GFR calc non Af Amer 67 (*)    GFR calc Af Amer 78 (*)    Anion gap 17 (*)    All other components within normal limits  LIPASE, BLOOD - Abnormal; Notable for the following:    Lipase 104 (*)    All other components within normal limits  URINALYSIS, ROUTINE W REFLEX MICROSCOPIC - Abnormal; Notable for the following:    Color, Urine AMBER (*)    APPearance CLOUDY (*)    Bilirubin Urine SMALL (*)    Protein, ur 30 (*)    Leukocytes, UA SMALL (*)    All other components within normal limits  URINE MICROSCOPIC-ADD ON - Abnormal; Notable for the following:    Bacteria, UA FEW (*)    Casts HYALINE CASTS (*)    Crystals CA OXALATE CRYSTALS (*)    All other components within normal limits  TROPONIN I  POC URINE PREG, ED    Imaging Review Ct Abdomen Pelvis W Contrast  08/27/2013   CLINICAL DATA:  Abdominal pain  EXAM: CT ABDOMEN AND PELVIS WITH CONTRAST  TECHNIQUE: Multidetector CT imaging of the abdomen and pelvis was performed using the standard protocol following bolus administration of intravenous contrast.  CONTRAST:  44mL OMNIPAQUE IOHEXOL 300 MG/ML SOLN, 175mL OMNIPAQUE IOHEXOL 300 MG/ML SOLN  COMPARISON:  09/11/2012  FINDINGS: The lung bases are clear.  The liver demonstrates no focal abnormality. There is no intrahepatic or extrahepatic biliary ductal dilatation. The gallbladder is normal. The spleen demonstrates no focal abnormality. The kidneys, adrenal  glands and pancreas are normal. The bladder is unremarkable.  The small bowel and colon is diffusely fluid-filled without pathologic dilatation. There is no bowel wall thickening. Contrast is seen into the distal ileum. Moderate-sized hiatal hernia. There is no pneumoperitoneum, pneumatosis, or portal venous gas. There is no abdominal or pelvic free fluid. There is no lymphadenopathy.  The abdominal aorta is normal in caliber.  There are no lytic or sclerotic osseous lesions.  IMPRESSION: Fluid filled small bowel and colon without evidence of bowel obstruction. The appearance likely reflects enterocolitis.   Electronically Signed   By: Kathreen Devoid   On: 08/27/2013 20:25   Dg Abd 2 Views  08/27/2013   CLINICAL DATA:  Nausea, vomiting  EXAM: ABDOMEN - 2 VIEW  COMPARISON:  None.  FINDINGS: Relative paucity of bowel gas. There are multiple air-fluid levels in the right and left abdomen with a few of the fluid levels appear to be within the colon. There is no evidence of pneumoperitoneum, portal venous gas or pneumatosis. There are no pathologic calcifications along the expected course of the ureters.  The osseous structures are unremarkable.  IMPRESSION: Relative paucity of bowel gas with multiple air-fluid levels. This appearance can be seen with enterocolitis versus distal bowel obstruction.   Electronically Signed   By: Kathreen Devoid   On: 08/27/2013 18:13     EKG Interpretation   Date/Time:  Thursday August 27 2013 16:26:25 EDT Ventricular Rate:  68 PR Interval:  139 QRS Duration: 90 QT Interval:  367 QTC Calculation: 390 R Axis:   -9 Text Interpretation:  Atrial-paced rhythm Low voltage, precordial leads  Nonspecific T abnormalities, anterior leads No significant change since  last tracing Confirmed by YAO  MD, DAVID ([id number]) on 08/27/2013 4:30:39 PM      MDM   Final diagnoses:  Enterocolitis  Nausea and vomiting, vomiting of unspecified type  Melanoma    Medications  sodium chloride 0.9 %  bolus 500 mL (500 mLs Intravenous New Bag/Given 08/27/13 1720)  ondansetron (ZOFRAN) injection 4 mg (4 mg Intravenous Given 08/27/13 1809)  iohexol (OMNIPAQUE) 300 MG/ML solution 100 mL (100 mLs Intravenous Contrast Given 08/27/13 2008)  iohexol (OMNIPAQUE) 300 MG/ML solution 50 mL (50 mLs Oral Contrast Given 08/27/13 2008)   Filed Vitals:   08/27/13 1812 08/27/13 1900 08/27/13 1915 08/27/13 1945  BP: 103/75 116/71 119/70 92/71  Pulse: 86 91 95   Temp:      TempSrc:      Resp: 18 18 19 21   SpO2: 100% 98% 98%    This provider reviewed patient's chart. Patient was last seen at [facility] on 08/25/2013 where CT head, CT abdomen pelvis with contrast, and CT chest with contrast were performed. CT abdomen and pelvis with contrast identified no new sites of metastasis, mild sigmoid colitis noted on CT with negative findings perforation. EKG noted atrial paced rhythm with low voltage, nonspecific T wave abnormalities noted-no significant change this last tracing identified. Troponin negative elevation. CBC negative elevated white blood cell count-negative should leukocytosis noted. Hemoglobin mildly elevated at 15.1-patient appears to be dehydrated. CMP noted mildly low potassium 3.3. Elevated calcium at 10.9-patient's history of cancer. Lipase mildly elevated at 104. Urine pregnancy negative. Urinalysis noted small bilirubin small leukocytes with white blood cells of 3-6 with oxalate crystals identified. Abdominal plain film noted relative paucity of bowel gas with multiple air-fluid levels-this appearance can be seen with enterocolitis versus distal bowel obstruction. CT abdomen and pelvis with contrast noted fluid filled small bowel and colon without evidence for bowel obstruction-appearance likely reflects enterocolitis. Negative acute abdominal processes noted on CT abdomen and pelvis with contrast. Negative SBO findings. Negative findings of perforation. Negative findings of appendicitis. Negative findings of  acute cholecystitis. Patient able to tolerate by mouth without episodes of emesis while in ED setting. Patient stable, afebrile. Patient not septic appearing. Patient seen and assessed by attending physician, Dr. Allie Bossier - labs and imaging reviewed by attending in great detail who agreed to plan of discharge. Discharged patient. Referred patient to primary care provider and oncologist. Discussed with patient to continue with BRAT diet-clear diet. Discussed with patient to stay hydrated. Discussed with patient to closely monitor symptoms and if symptoms are to worsen or change to report back to the ED - strict return instructions given.  Patient agreed to plan of care, understood, all questions answered.   Jamse Mead, PA-C 08/28/13 [phone]

## 2013-08-27 NOTE — ED Notes (Addendum)
Per EMS, Pt has melanoma in brain lung and liver. Pt was placed on new drug, uvoin which causes colitis. Pt was placed on prednisone two days for the colitis. Pt typically has diarrhea, but today it was different. Pt had N/V this afternoon, vomited three times, yellow bile. Pt has been gagging but no vomiting with EMS. Pt A&Ox4. Pt given 4mg  Zofran on the way in.

## 2013-08-28 ENCOUNTER — Other Ambulatory Visit: Payer: Self-pay | Admitting: Hematology & Oncology

## 2013-08-28 ENCOUNTER — Other Ambulatory Visit: Payer: Self-pay

## 2013-08-28 ENCOUNTER — Inpatient Hospital Stay (HOSPITAL_COMMUNITY)
Admission: AD | Admit: 2013-08-28 | Discharge: 2013-08-30 | DRG: 391 | Disposition: A | Discharge status: Home / Self Care | Payer: Medicare Other | Source: Ambulatory Visit | Attending: Oncology | Admitting: Oncology

## 2013-08-28 DIAGNOSIS — R918 Other nonspecific abnormal finding of lung field: Secondary | ICD-10-CM | POA: Diagnosis present

## 2013-08-28 DIAGNOSIS — T451X5A Adverse effect of antineoplastic and immunosuppressive drugs, initial encounter: Secondary | ICD-10-CM | POA: Diagnosis present

## 2013-08-28 DIAGNOSIS — C7931 Secondary malignant neoplasm of brain: Secondary | ICD-10-CM | POA: Diagnosis present

## 2013-08-28 DIAGNOSIS — Z882 Allergy status to sulfonamides status: Secondary | ICD-10-CM | POA: Diagnosis not present

## 2013-08-28 DIAGNOSIS — K859 Acute pancreatitis without necrosis or infection, unspecified: Secondary | ICD-10-CM | POA: Diagnosis present

## 2013-08-28 DIAGNOSIS — C7949 Secondary malignant neoplasm of other parts of nervous system: Secondary | ICD-10-CM | POA: Diagnosis present

## 2013-08-28 DIAGNOSIS — IMO0002 Reserved for concepts with insufficient information to code with codable children: Secondary | ICD-10-CM | POA: Diagnosis not present

## 2013-08-28 DIAGNOSIS — C439 Malignant melanoma of skin, unspecified: Secondary | ICD-10-CM | POA: Diagnosis not present

## 2013-08-28 DIAGNOSIS — C801 Malignant (primary) neoplasm, unspecified: Secondary | ICD-10-CM | POA: Diagnosis present

## 2013-08-28 DIAGNOSIS — I10 Essential (primary) hypertension: Secondary | ICD-10-CM | POA: Diagnosis present

## 2013-08-28 DIAGNOSIS — Z8673 Personal history of transient ischemic attack (TIA), and cerebral infarction without residual deficits: Secondary | ICD-10-CM

## 2013-08-28 DIAGNOSIS — K5289 Other specified noninfective gastroenteritis and colitis: Secondary | ICD-10-CM | POA: Diagnosis present

## 2013-08-28 DIAGNOSIS — Z881 Allergy status to other antibiotic agents status: Secondary | ICD-10-CM

## 2013-08-28 DIAGNOSIS — R748 Abnormal levels of other serum enzymes: Secondary | ICD-10-CM | POA: Diagnosis present

## 2013-08-28 DIAGNOSIS — Z950 Presence of cardiac pacemaker: Secondary | ICD-10-CM | POA: Diagnosis not present

## 2013-08-28 DIAGNOSIS — R7309 Other abnormal glucose: Secondary | ICD-10-CM

## 2013-08-28 DIAGNOSIS — R197 Diarrhea, unspecified: Secondary | ICD-10-CM

## 2013-08-28 DIAGNOSIS — K449 Diaphragmatic hernia without obstruction or gangrene: Secondary | ICD-10-CM | POA: Diagnosis present

## 2013-08-28 DIAGNOSIS — T380X5A Adverse effect of glucocorticoids and synthetic analogues, initial encounter: Secondary | ICD-10-CM | POA: Diagnosis present

## 2013-08-28 DIAGNOSIS — R29898 Other symptoms and signs involving the musculoskeletal system: Secondary | ICD-10-CM | POA: Diagnosis present

## 2013-08-28 DIAGNOSIS — Z880 Allergy status to penicillin: Secondary | ICD-10-CM | POA: Diagnosis not present

## 2013-08-28 DIAGNOSIS — Z86718 Personal history of other venous thrombosis and embolism: Secondary | ICD-10-CM | POA: Diagnosis not present

## 2013-08-28 DIAGNOSIS — K7689 Other specified diseases of liver: Secondary | ICD-10-CM | POA: Diagnosis present

## 2013-08-28 DIAGNOSIS — K529 Noninfective gastroenteritis and colitis, unspecified: Secondary | ICD-10-CM | POA: Diagnosis present

## 2013-08-28 LAB — COMPREHENSIVE METABOLIC PANEL
ALT: 29 U/L (ref 0–35)
AST: 19 U/L (ref 0–37)
Albumin: 3.9 g/dL (ref 3.5–5.2)
Alkaline Phosphatase: 76 U/L (ref 39–117)
Anion gap: 13 (ref 5–15)
BUN: 14 mg/dL (ref 6–23)
CO2: 23 mEq/L (ref 19–32)
Calcium: 9.8 mg/dL (ref 8.4–10.5)
Chloride: 103 mEq/L (ref 96–112)
Creatinine, Ser: 0.86 mg/dL (ref 0.50–1.10)
GFR calc Af Amer: 87 mL/min — ABNORMAL LOW (ref >90)
GFR calc non Af Amer: 75 mL/min — ABNORMAL LOW (ref >90)
Glucose, Bld: 123 mg/dL — ABNORMAL HIGH (ref 70–99)
Potassium: 4.1 mEq/L (ref 3.7–5.3)
Sodium: 139 mEq/L (ref 137–147)
Total Bilirubin: 0.5 mg/dL (ref 0.3–1.2)
Total Protein: 7.5 g/dL (ref 6.0–8.3)

## 2013-08-28 LAB — CBC
HCT: 36.1 % (ref 36.0–46.0)
Hemoglobin: 12.2 g/dL (ref 12.0–15.0)
MCH: 28.4 pg (ref 26.0–34.0)
MCHC: 33.8 g/dL (ref 30.0–36.0)
MCV: 84 fL (ref 78.0–100.0)
Platelets: 273 10*3/uL (ref 150–400)
RBC: 4.3 MIL/uL (ref 3.87–5.11)
RDW: 15.6 % — ABNORMAL HIGH (ref 11.5–15.5)
WBC: 6.8 10*3/uL (ref 4.0–10.5)

## 2013-08-28 LAB — APTT: aPTT: 28 seconds (ref 24–37)

## 2013-08-28 LAB — PROTIME-INR
INR: 1.03 (ref 0.00–1.49)
Prothrombin Time: 13.5 seconds (ref 11.6–15.2)

## 2013-08-28 LAB — PHOSPHORUS: Phosphorus: 3.2 mg/dL (ref 2.3–4.6)

## 2013-08-28 LAB — MAGNESIUM: Magnesium: 2 mg/dL (ref 1.5–2.5)

## 2013-08-28 LAB — LIPASE, BLOOD: Lipase: 48 U/L (ref 11–59)

## 2013-08-28 MED ORDER — METFORMIN HCL 500 MG PO TABS
500.0000 mg | ORAL_TABLET | Freq: Every day | ORAL | Status: DC
  Administered 2013-08-29: 500 mg via ORAL
  Filled 2013-08-28 (×3): qty 1

## 2013-08-28 MED ORDER — CIPROFLOXACIN IN D5W 400 MG/200ML IV SOLN
400.0000 mg | Freq: Two times a day (BID) | INTRAVENOUS | Status: DC
  Administered 2013-08-28 – 2013-08-29 (×2): 400 mg via INTRAVENOUS
  Filled 2013-08-28 (×3): qty 200

## 2013-08-28 MED ORDER — METHYLPREDNISOLONE SODIUM SUCC 125 MG IJ SOLR
60.0000 mg | Freq: Three times a day (TID) | INTRAMUSCULAR | Status: DC
  Administered 2013-08-28 – 2013-08-29 (×2): 60 mg via INTRAVENOUS
  Filled 2013-08-28 (×5): qty 0.96

## 2013-08-28 MED ORDER — METRONIDAZOLE IN NACL 5-0.79 MG/ML-% IV SOLN
500.0000 mg | Freq: Three times a day (TID) | INTRAVENOUS | Status: DC
  Administered 2013-08-28 – 2013-08-29 (×2): 500 mg via INTRAVENOUS
  Filled 2013-08-28 (×3): qty 100

## 2013-08-28 MED ORDER — CLONIDINE HCL 0.1 MG PO TABS
0.1000 mg | ORAL_TABLET | Freq: Every day | ORAL | Status: DC
  Administered 2013-08-28 – 2013-08-29 (×2): 0.1 mg via ORAL
  Filled 2013-08-28 (×3): qty 1

## 2013-08-28 MED ORDER — ONDANSETRON 8 MG PO TBDP: 4.0000 mg | ORAL_TABLET | Freq: Four times a day (QID) | ORAL | Status: DC | PRN

## 2013-08-28 MED ORDER — ONDANSETRON 8 MG/NS 50 ML IVPB
8.0000 mg | Freq: Four times a day (QID) | INTRAVENOUS | Status: DC | PRN
  Filled 2013-08-28: qty 8

## 2013-08-28 MED ORDER — LEVETIRACETAM 500 MG PO TABS
500.0000 mg | ORAL_TABLET | Freq: Two times a day (BID) | ORAL | Status: DC
  Administered 2013-08-28 – 2013-08-29 (×3): 500 mg via ORAL
  Filled 2013-08-28 (×5): qty 1

## 2013-08-28 MED ORDER — CLONAZEPAM 0.5 MG PO TABS
0.5000 mg | ORAL_TABLET | Freq: Three times a day (TID) | ORAL | Status: DC | PRN
  Administered 2013-08-29 (×2): 0.5 mg via ORAL
  Filled 2013-08-28 (×2): qty 1

## 2013-08-28 MED ORDER — NYSTATIN 100000 UNIT/ML MT SUSP
5.0000 mL | Freq: Four times a day (QID) | OROMUCOSAL | Status: DC
  Administered 2013-08-28 – 2013-08-29 (×4): 500000 [IU] via ORAL
  Filled 2013-08-28 (×10): qty 5

## 2013-08-28 MED ORDER — METOPROLOL TARTRATE 25 MG PO TABS
25.0000 mg | ORAL_TABLET | Freq: Four times a day (QID) | ORAL | Status: DC
  Administered 2013-08-28 – 2013-08-29 (×4): 25 mg via ORAL
  Filled 2013-08-28 (×10): qty 1

## 2013-08-28 MED ORDER — ACETAMINOPHEN 500 MG PO TABS
1000.0000 mg | ORAL_TABLET | Freq: Four times a day (QID) | ORAL | Status: DC | PRN
  Administered 2013-08-28 – 2013-08-29 (×3): 1000 mg via ORAL
  Filled 2013-08-28 (×3): qty 2

## 2013-08-28 MED ORDER — ENOXAPARIN SODIUM 40 MG/0.4ML ~~LOC~~ SOLN
40.0000 mg | SUBCUTANEOUS | Status: DC
Start: 2013-08-28 — End: 2013-08-30
  Filled 2013-08-28 (×3): qty 0.4

## 2013-08-28 MED ORDER — POTASSIUM CHLORIDE IN NACL 40-0.9 MEQ/L-% IV SOLN
INTRAVENOUS | Status: DC
  Administered 2013-08-28 – 2013-08-29 (×2): 100 mL/h via INTRAVENOUS
  Filled 2013-08-28 (×3): qty 1000

## 2013-08-28 MED ORDER — ALBUTEROL SULFATE (2.5 MG/3ML) 0.083% IN NEBU
3.0000 mL | INHALATION_SOLUTION | RESPIRATORY_TRACT | Status: DC | PRN
Start: 2013-08-28 — End: 2013-08-30

## 2013-08-28 MED ORDER — DIPHENHYDRAMINE HCL 25 MG PO CAPS
25.0000 mg | ORAL_CAPSULE | Freq: Every evening | ORAL | Status: DC | PRN
  Administered 2013-08-28 – 2013-08-29 (×2): 25 mg via ORAL
  Filled 2013-08-28 (×2): qty 1

## 2013-08-28 MED ORDER — PANTOPRAZOLE SODIUM 40 MG PO TBEC
40.0000 mg | DELAYED_RELEASE_TABLET | Freq: Every day | ORAL | Status: DC
  Administered 2013-08-28 – 2013-08-29 (×2): 40 mg via ORAL
  Filled 2013-08-28 (×4): qty 1

## 2013-08-28 MED ORDER — ONDANSETRON 8 MG PO TBDP: 4.0000 mg | ORAL_TABLET | ORAL | Status: DC | PRN

## 2013-08-28 NOTE — H&P (Signed)
#   [id number] is admit note.  Pete E.

## 2013-08-28 NOTE — Progress Notes (Addendum)
ANTIBIOTIC CONSULT NOTE - INITIAL  Pharmacy Consult for:  Ciprofloxacin Indication:   Intra-abdominal infection  Allergies  Allergen Reactions  . Doxycycline Hives  . Erythromycin Hives  . Penicillins Hives  . Preparation H [Pramox-Pe-Glycerin-Petrolatum] Hives    Cannot use  Cream or Suppositories   . Sulfonamide Derivatives Hives    Patient Measurements: Height: 5\' 3"  (160 cm) (taken 08/13/13) Weight: 157 lb (71.215 kg) (taken 08/13/13) IBW/kg (Calculated) : 52.4 kg   Vital Signs: Temp: 98.4 F (36.9 C) (07/03 1704) Temp src: Oral (07/03 1704) BP: 135/86 mmHg (07/03 1704) Pulse Rate: 86 (07/03 1704)  Labs:  Recent Labs  08/27/13 1632  WBC 8.5  HGB 15.1*  PLT 274  CREATININE 0.94   Estimated Creatinine Clearance: 63.9 ml/min (by C-G formula based on Cr of 0.94).    Microbiology: No results found for this or any previous visit (from the past 720 hour(s)).  Medical History: Past Medical History  Diagnosis Date  . 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 Hypoglycemia, unspecified   . Other nonspecific abnormal serum enzyme levels   . Cervicalgia   . Disturbance of skin sensation   . Other specified congenital anomalies of nervous system   . Swelling of limb   . Disorder of bone and cartilage, unspecified   . Atrial tachycardia   . CVA (cerebral infarction)     2012 with dizziness and vision change felt embolic from atrial tachy  . History of pacemaker   . POTS (postural orthostatic tachycardia syndrome)   . Diverticulosis   . Osteopenia   . DVT (deep venous thrombosis)   . Atrial fibrillation   . DM (diabetes mellitus)   . DVT (deep venous thrombosis)   . HLD (hyperlipidemia)   . HTN (hypertension)   . Pacemaker     autonomic dysfunction  . Complication of anesthesia     pt states wakes up with "shakes"  . PONV (postoperative nausea and vomiting)   . Stroke     left weaker   . History of radiation therapy 10/24/12     brain  . Skin cancer     Hx: of lung lesion  . Family history of anesthesia complication     PONV  . Asthma     Medications:  Scheduled:  . ciprofloxacin  400 mg Intravenous Q12H  . enoxaparin (LOVENOX) injection  40 mg Subcutaneous Q24H  . methylPREDNISolone (SOLU-MEDROL) injection  60 mg Intravenous Q8H   Assessment: Asked to assist with IV Ciprofloxacin therapy for this 58 year-old female with an intra-abdominal infection.  IV Metronidazole has also been ordered.  Goal of Therapy:  Eradication of infection  Plan: Ciprofloxacin 400 mg IV every 12 hours  [employer].Ph. 08/28/2013,5:40 PM

## 2013-08-29 ENCOUNTER — Encounter (HOSPITAL_COMMUNITY): Payer: Self-pay | Admitting: *Deleted

## 2013-08-29 DIAGNOSIS — R109 Unspecified abdominal pain: Secondary | ICD-10-CM | POA: Diagnosis not present

## 2013-08-29 DIAGNOSIS — R112 Nausea with vomiting, unspecified: Secondary | ICD-10-CM

## 2013-08-29 DIAGNOSIS — C4360 Malignant melanoma of unspecified upper limb, including shoulder: Secondary | ICD-10-CM | POA: Diagnosis not present

## 2013-08-29 DIAGNOSIS — R748 Abnormal levels of other serum enzymes: Secondary | ICD-10-CM

## 2013-08-29 DIAGNOSIS — T451X5A Adverse effect of antineoplastic and immunosuppressive drugs, initial encounter: Secondary | ICD-10-CM

## 2013-08-29 DIAGNOSIS — R197 Diarrhea, unspecified: Secondary | ICD-10-CM | POA: Diagnosis not present

## 2013-08-29 LAB — CBC
HCT: 36.1 % (ref 36.0–46.0)
Hemoglobin: 11.8 g/dL — ABNORMAL LOW (ref 12.0–15.0)
MCH: 28 pg (ref 26.0–34.0)
MCHC: 32.7 g/dL (ref 30.0–36.0)
MCV: 85.7 fL (ref 78.0–100.0)
Platelets: 234 10*3/uL (ref 150–400)
RBC: 4.21 MIL/uL (ref 3.87–5.11)
RDW: 15.4 % (ref 11.5–15.5)
WBC: 4.2 10*3/uL (ref 4.0–10.5)

## 2013-08-29 LAB — GLUCOSE, CAPILLARY: Glucose-Capillary: 156 mg/dL — ABNORMAL HIGH (ref 70–99)

## 2013-08-29 LAB — COMPREHENSIVE METABOLIC PANEL
ALT: 25 U/L (ref 0–35)
AST: 14 U/L (ref 0–37)
Albumin: 3.4 g/dL — ABNORMAL LOW (ref 3.5–5.2)
Alkaline Phosphatase: 71 U/L (ref 39–117)
Anion gap: 11 (ref 5–15)
BUN: 10 mg/dL (ref 6–23)
CO2: 23 mEq/L (ref 19–32)
Calcium: 9.3 mg/dL (ref 8.4–10.5)
Chloride: 107 mEq/L (ref 96–112)
Creatinine, Ser: 0.75 mg/dL (ref 0.50–1.10)
GFR calc Af Amer: >90 mL/min (ref >90)
GFR calc non Af Amer: >90 mL/min (ref >90)
Glucose, Bld: 147 mg/dL — ABNORMAL HIGH (ref 70–99)
Potassium: 4 mEq/L (ref 3.7–5.3)
Sodium: 141 mEq/L (ref 137–147)
Total Bilirubin: 0.3 mg/dL (ref 0.3–1.2)
Total Protein: 6.7 g/dL (ref 6.0–8.3)

## 2013-08-29 LAB — URINE CULTURE: Colony Count: 10000

## 2013-08-29 LAB — TSH: TSH: 0.318 u[IU]/mL — ABNORMAL LOW (ref 0.350–4.500)

## 2013-08-29 LAB — CLOSTRIDIUM DIFFICILE BY PCR: Toxigenic C. Difficile by PCR: NEGATIVE

## 2013-08-29 LAB — LIPASE, BLOOD: Lipase: 56 U/L (ref 11–59)

## 2013-08-29 MED ORDER — METHYLPREDNISOLONE SODIUM SUCC 125 MG IJ SOLR
60.0000 mg | INTRAMUSCULAR | Status: DC
  Administered 2013-08-30: 60 mg via INTRAVENOUS
  Filled 2013-08-29 (×2): qty 0.96

## 2013-08-29 MED ORDER — BIOTENE DRY MOUTH MT LIQD
15.0000 mL | Freq: Two times a day (BID) | OROMUCOSAL | Status: DC
  Administered 2013-08-29: 15 mL via OROMUCOSAL

## 2013-08-29 MED ORDER — POTASSIUM CHLORIDE 2 MEQ/ML IV SOLN
INTRAVENOUS | Status: DC
  Administered 2013-08-29: 12:00:00 via INTRAVENOUS
  Filled 2013-08-29 (×3): qty 1000

## 2013-08-29 NOTE — Progress Notes (Signed)
IP PROGRESS NOTE  Subjective:   She is well-known to me with a history of metastatic melanoma, status post treatment with ipilumumab. She was maintained on prednisone at a dose of 10 mg daily for treatment of enteritis. She presents emergency room 08/27/2013 with acute onset nausea/vomiting, abdominal pain, and increased diarrhea. A CT revealed no acute changes. She was treated with anti-emetics and intravenous fluids. She was discharged to home. She was admitted yesterday after discussions with Dr. Lucretia Field and Dr. Marin Olp. She denies diarrhea since admission to the hospital. The abdominal pain and nausea have resolved.   Objective: Vital signs in last 24 hours: Blood pressure 111/73, pulse 62, temperature 97.5 F (36.4 C), temperature source Oral, resp. rate 18, height 5' 3"  (1.6 m), weight 157 lb (71.215 kg), SpO2 97.00%.  Intake/Output from previous day: 07/03 0701 - 07/04 0700 In: 2551.7 [P.O.:920; I.V.:1031.7; IV Piggyback:600] Out: 1300 [Urine:1300]  Physical Exam:  HEENT: No thrush Lungs: Clear bilaterally Cardiac: Regular rate and rhythm Abdomen: No hepatosplenomegaly, nontender, soft Extremities: No leg edema    Lab Results:  Recent Labs  08/28/13 1740 08/29/13 0517  WBC 6.8 4.2  HGB 12.2 11.8*  HCT 36.1 36.1  PLT 273 234    BMET  Recent Labs  08/28/13 1740 08/29/13 0517  NA 139 141  K 4.1 4.0  CL 103 107  CO2 23 23  GLUCOSE 123* 147*  BUN 14 10  CREATININE 0.86 0.75  CALCIUM 9.8 9.3   08/28/2013-lipase 48  Studies/Results: Ct Abdomen Pelvis W Contrast  08/27/2013   CLINICAL DATA:  Abdominal pain  EXAM: CT ABDOMEN AND PELVIS WITH CONTRAST  TECHNIQUE: Multidetector CT imaging of the abdomen and pelvis was performed using the standard protocol following bolus administration of intravenous contrast.  CONTRAST:  2m OMNIPAQUE IOHEXOL 300 MG/ML SOLN, 1019mOMNIPAQUE IOHEXOL 300 MG/ML SOLN  COMPARISON:  09/11/2012  FINDINGS: The lung bases are clear.   The liver demonstrates no focal abnormality. There is no intrahepatic or extrahepatic biliary ductal dilatation. The gallbladder is normal. The spleen demonstrates no focal abnormality. The kidneys, adrenal glands and pancreas are normal. The bladder is unremarkable.  The small bowel and colon is diffusely fluid-filled without pathologic dilatation. There is no bowel wall thickening. Contrast is seen into the distal ileum. Moderate-sized hiatal hernia. There is no pneumoperitoneum, pneumatosis, or portal venous gas. There is no abdominal or pelvic free fluid. There is no lymphadenopathy.  The abdominal aorta is normal in caliber.  There are no lytic or sclerotic osseous lesions.  IMPRESSION: Fluid filled small bowel and colon without evidence of bowel obstruction. The appearance likely reflects enterocolitis.   Electronically Signed   By: HeKathreen Devoid On: 08/27/2013 20:25   Dg Abd 2 Views  08/27/2013   CLINICAL DATA:  Nausea, vomiting  EXAM: ABDOMEN - 2 VIEW  COMPARISON:  None.  FINDINGS: Relative paucity of bowel gas. There are multiple air-fluid levels in the right and left abdomen with a few of the fluid levels appear to be within the colon. There is no evidence of pneumoperitoneum, portal venous gas or pneumatosis. There are no pathologic calcifications along the expected course of the ureters.  The osseous structures are unremarkable.  IMPRESSION: Relative paucity of bowel gas with multiple air-fluid levels. This appearance can be seen with enterocolitis versus distal bowel obstruction.   Electronically Signed   By: HeKathreen Devoid On: 08/27/2013 18:13    Medications: I have reviewed the patient's current medications.  Assessment/Plan:  1. Metastatic melanoma -Status post resection of a right arm melanoma, stage II A (T2b N0) in December 2009  -Metastatic melanoma, status post resection of a left cerebellar metastasis 09/30/2012, BRAF mutation identified  -Status post SRS treatment of the left  cerebellum and a left parietal metastasis 10/24/2012  -Staging CTs and PET scan without evidence of distant metastatic disease aside from indeterminate small lung nodules  -Restaging CTs 12/01/2012 consistent with enlargement of the left parietal mass and right lower lobe lung nodule  -Initiation of dabrafenib and tremetinib 12/17/2012. She reports extremely poor tolerance. Discontinued.  -Status post resection of a left parietal metastasis 02/09/2013.  -Restaging CT evaluation 03/03/2013. Stable 1.5 cm right lower lobe pulmonary nodule. Multiple peripheral enhancing lesions throughout the liver.  -Initiation of Ipilimumab at [facility] 03/10/2013.  -Cycle 2 Ipilumumab 04/17/2013  -Cycle 3 Ipilumumab 05/14/2013  -Cycle 4 Ipilumumab 06/04/2013  -Restaging CT at [facility] 06/25/2013 with multiple subcentimeter liver lesions, no new lesions, decreased right lower lobe nodule.  -Brain MRI 07/07/2013 with no evidence of progressive metastatic disease.  -Restaging CT at [facility] 08/25/2013 no new metastatic disease, stable liver lesions and right lower lobe nodule 2. Arrhythmia, pacemaker in place.  3. Hypertension  4. History of a CVA  5. History of deep vein thrombosis  6. History of proximal leg weakness-likely related to chronic steroid use.  7. Intermittent rash at the posterior thighs and left hand-likely related to systemic therapy.  8. Leg edema-? Related to liver disease,? Secondary to ipilumumab . Improved on furosemide. Currently not taking furosemide.  9. Headache and mild nausea-coincided with discontinuation of Decadron, CT of the brain 03/25/2011 with no acute change.  10. Diarrhea secondary to Ipilumumab-maintained on a slow steroid taper prior to hospital admission 11. Acute onset nausea/vomiting, abdominal pain, and increased diarrhea 08/27/2013-now improved 12. Mild elevation of the lipase-normalized at less than 24 hours, low clinical suspicion for pancreatitis     She appears well today. The  acute symptoms 08/27/2013 may have been related to her medial at "[employer] ". She appears at baseline today. I have a low clinical suspicion for diverticulitis or pancreatitis. The diarrhea over the past several months is likely related to ipilumumab enteritis. She does not appear acutely ill and they were in no acute findings on the abdomen CT 08/27/2013.  We will advance her to a bland diet and discontinue antibiotics. She will be discharged home over the next 24 hours if her condition remains stable.  LOS: 1 day   [place]  08/29/2013, 8:46 AM

## 2013-08-29 NOTE — H&P (Signed)
[name], [name]              ACCOUNT NO.:  [id number]  MEDICAL RECORD NO.:  [id number]  LOCATION:  27                         FACILITY:  [facility]  PHYSICIAN:  Volanda Napoleon, M.D.  DATE OF BIRTH:  [birth year]  DATE OF ADMISSION:  08/28/2013 DATE OF DISCHARGE:                             HISTORY & PHYSICAL   REASON FOR ADMISSION: 1. Diarrhea secondary to colitis. 2. Metastatic melanoma. 3. Hypertension. 4. Steroid-induced hyperglycemia.  HISTORY OF PRESENT ILLNESS:  [name] is a very charming 58 year old white female.  She has metastatic melanoma.  She is followed by Dr. Julieanne Manson.  She is also followed down at [facility] by Dr. Harriet Masson.  She is on [place].  She has had four doses.  Her last dose was 2 months ago.  She has colitis.  She has had chronic diarrhea.  This has gotten a lot worse.  She was in the emergency room yesterday.  She was subsequently released.  Her diarrhea has continued.  Whenever she tries to eat, she has diarrhea.  Because of her protocol with [name], we feel that it is best to admit her for high dose steroids and IV antibiotics.  She is having some slight abdominal discomfort.  She has had no vomiting today.  She had a CT scan yesterday of the abdomen and pelvis.  This did show some fluid-filled small bowel and colon.  There was no bowel obstruction.  There was no bowel wall thickening.  She had a moderate- size hiatal hernia.  No lymphadenopathy was noted.  Her lab work yesterday showed an elevated lipase of 104.  Her calcium was 10.9 with an albumin of 4.7.  Potassium was 3.3.  Her white cell count was 8.5.  Her hemoglobin was 15.1.  She does have a pacemaker in place.  She has had no problems with this.  She has had no obvious fever.  She did have a urinalysis yesterday.  This turned out to be okay.  She has had no bleeding.  She has had no cough or shortness of breath.  She is being admitted now for prophylactic  antibiotics and steroids.  PAST MEDICAL HISTORY:  As stated in the medical record.  ALLERGIES: 1. DOXYCYCLINE. 2. ERYTHROMYCIN. 3. PENICILLIN. 4. SULFA.  MEDICATIONS:  Her medications are listed in the medical record.  REVIEW OF SYSTEMS:  As stated in history of present illness.  PHYSICAL EXAMINATION:  GENERAL:  This is a fairly well-developed, well- nourished, white female, in no obvious distress.  She is alert and oriented x3. VITAL SIGNS:  Temperature of 98.4, pulse 86, blood pressure 135/86. HEAD AND NECK:  No ocular or oral lesions.  There are no palpable cervical or supraclavicular lymph nodes. LUNGS:  Clear bilaterally. CARDIAC:  Regular rate and rhythm with no murmurs, rubs, or bruits. ABDOMEN:  Soft.  It is not distended.  She has decent bowel sounds. There is no guarding or rebound tenderness.  There is no palpable liver or spleen tip. EXTREMITIES:  No clubbing, cyanosis, or edema. SKIN:  No rashes. NEUROLOGIC:  Nonfocal.  IMPRESSION:  [name] is a 58 year old white female with metastatic melanoma.  She has responded nicely.  She  is on [name].  She has had four cycles.  Her last cycle was in May.  She has underlying colitis.  Again, with her being on [name], one have to worry about immune-mediated colitis.  As such, we are giving her high- dose steroids.  We will empirically put her on Flagyl and Cipro.  We will give her IV fluids.  We will just keep her on clear liquids for right now.  I do not think we need any parenteral nutrition on her.  She has a pacemaker in.  She will prefer not to have a central line. This may be needed if we have started some kind of parenteral nutrition.  We will watch her electrolytes.  I do not think we need to do any additional scans on her.  We will have to watch her blood sugars.  I suspect that she probably will be hospitalized for at least 3 or 4 days.  I do not think we need any Gastroenterology evaluation at  this point.     Volanda Napoleon, M.D.     PRE/MEDQ  D:  08/28/2013  T:  08/29/2013  Job:  [id number]

## 2013-08-30 DIAGNOSIS — R197 Diarrhea, unspecified: Secondary | ICD-10-CM | POA: Diagnosis not present

## 2013-08-30 DIAGNOSIS — C719 Malignant neoplasm of brain, unspecified: Secondary | ICD-10-CM | POA: Diagnosis not present

## 2013-08-30 DIAGNOSIS — C4360 Malignant melanoma of unspecified upper limb, including shoulder: Secondary | ICD-10-CM | POA: Diagnosis not present

## 2013-08-30 DIAGNOSIS — Z452 Encounter for adjustment and management of vascular access device: Secondary | ICD-10-CM | POA: Diagnosis not present

## 2013-08-30 DIAGNOSIS — R109 Unspecified abdominal pain: Secondary | ICD-10-CM | POA: Diagnosis not present

## 2013-08-30 DIAGNOSIS — Z5181 Encounter for therapeutic drug level monitoring: Secondary | ICD-10-CM | POA: Diagnosis not present

## 2013-08-30 DIAGNOSIS — Z483 Aftercare following surgery for neoplasm: Secondary | ICD-10-CM | POA: Diagnosis not present

## 2013-08-30 DIAGNOSIS — C439 Malignant melanoma of skin, unspecified: Secondary | ICD-10-CM | POA: Diagnosis not present

## 2013-08-30 DIAGNOSIS — G909 Disorder of the autonomic nervous system, unspecified: Secondary | ICD-10-CM | POA: Diagnosis not present

## 2013-08-30 DIAGNOSIS — R112 Nausea with vomiting, unspecified: Secondary | ICD-10-CM | POA: Diagnosis not present

## 2013-08-30 LAB — GLUCOSE, CAPILLARY: Glucose-Capillary: 87 mg/dL (ref 70–99)

## 2013-08-30 LAB — BASIC METABOLIC PANEL
Anion gap: 11 (ref 5–15)
BUN: 8 mg/dL (ref 6–23)
CO2: 22 mEq/L (ref 19–32)
Calcium: 8.9 mg/dL (ref 8.4–10.5)
Chloride: 111 mEq/L (ref 96–112)
Creatinine, Ser: 0.79 mg/dL (ref 0.50–1.10)
GFR calc Af Amer: >90 mL/min (ref >90)
GFR calc non Af Amer: >90 mL/min (ref >90)
Glucose, Bld: 95 mg/dL (ref 70–99)
Potassium: 3.8 mEq/L (ref 3.7–5.3)
Sodium: 144 mEq/L (ref 137–147)

## 2013-08-30 LAB — LIPASE, BLOOD: Lipase: 77 U/L — ABNORMAL HIGH (ref 11–59)

## 2013-08-30 NOTE — Discharge Instructions (Signed)
1. follow a low flat and BRAT diet  2. Call for abdominal pain, nausea, or increased diarrhea

## 2013-08-30 NOTE — Progress Notes (Signed)
Patient discharged to home via wheelchair with husband, discharge instructions reviewed with patient who verbalized understanding. No new RX's given.

## 2013-08-30 NOTE — Discharge Summary (Signed)
Physician Discharge Summary  Patient ID: [name] @ATTENDINGNPI @ MRN: [id number] DOB/AGE: 1955/06/24 58 y.o.  Admit date: 08/28/2013 Discharge date: 08/30/2013  Discharge Diagnoses:  Active Problems: 1. Metastatic melanoma 2. Ipilumumab enteritis 3. mild elevation of the lipase-? Mild pancreatitis   Discharged Condition: Improved  Discharge Labs:  potassium 3.8, creatinine 0.79, lipase 77  Significant Diagnostic Studies: None  Consults: None  Procedures:  None  Disposition: 01-Home or Self Care     Medication List    ASK your doctor about these medications       acetaminophen 500 MG tablet  Commonly known as:  TYLENOL  Take 1,000 mg by mouth every 6 (six) hours as needed for pain.     clonazePAM 0.5 MG tablet  Commonly known as:  KLONOPIN  Take 1 tablet (0.5 mg total) by mouth 3 (three) times daily as needed for anxiety.     cloNIDine 0.1 MG tablet  Commonly known as:  CATAPRES  Take 1 tablet (0.1 mg total) by mouth at bedtime.     cyanocobalamin 1000 MCG/ML injection  Commonly known as:  (VITAMIN B-12)  INJECT 1 ML UNDER THE SKIN AS DIRECTED ONCE A MONTH     diphenhydrAMINE 25 MG tablet  Commonly known as:  BENADRYL  Take 12.5 mg by mouth every 6 (six) hours as needed. Seasonal allergies     esomeprazole 40 MG capsule  Commonly known as:  NEXIUM  Take 40 mg by mouth 2 (two) times daily.     hydrocortisone 2.5 % cream  Apply 1 application topically 2 (two) times daily as needed (irritation).     hydrocortisone 25 MG suppository  Commonly known as:  ANUSOL-HC  Place 25 mg rectally at bedtime as needed for hemorrhoids.     hydrocortisone-pramoxine 2.5-1 % rectal cream  Commonly known as:  ANALPRAM-HC  Place 1 application rectally at bedtime as needed for hemorrhoids.     KEPPRA 500 MG tablet  Generic drug:  levETIRAcetam  Take 500 mg by mouth 2 (two) times daily.     mesalamine 1000 MG suppository  Commonly known as:  CANASA  Place 1,000 mg  rectally at bedtime as needed (hemorrhoids).     metFORMIN 500 MG tablet  Commonly known as:  GLUCOPHAGE  Take 500 mg by mouth daily after breakfast.     metoprolol tartrate 25 MG tablet  Commonly known as:  LOPRESSOR  Take 25 mg by mouth 4 (four) times daily.     nystatin 100000 UNIT/ML suspension  Commonly known as:  MYCOSTATIN  Take 5 mLs (500,000 Units total) by mouth 4 (four) times daily as needed.     ondansetron 4 MG disintegrating tablet  Commonly known as:  ZOFRAN-ODT  Take 4 mg by mouth as needed. 4mg  ODT q4 hours prn nausea/vomit     polyethylene glycol powder powder  Commonly known as:  GLYCOLAX/MIRALAX  Stir 17 grams in 8oz of water, or juice, 1 or 2 times daily for constipation     potassium chloride 20 MEQ/15ML (10%) solution  Take 15 mLs (20 mEq total) by mouth 3 times daily     predniSONE 10 MG tablet  Commonly known as:  DELTASONE  Take 10 mg by mouth daily with breakfast.     rosuvastatin 5 MG tablet  Commonly known as:  CRESTOR  Take 5 mg by mouth daily.     TGT ANTACID ANTI-GAS 200-200-20 MG/5ML suspension  Generic drug:  alum & mag hydroxide-simeth  VENTOLIN HFA 108 (90 BASE) MCG/ACT inhaler  Generic drug:  albuterol  Inhale 2 puffs into the lungs as needed.     Vitamin D (Ergocalciferol) 50000 UNITS Caps capsule  Commonly known as:  DRISDOL  Take 50,000 Units by mouth every 7 (seven) days.          Hospital Course: [name] presented to the emergency room 08/27/2013 with acute onset nausea/vomiting, right lower abdominal pain, and increased diarrhea following a meal at [place]. She was discharged from the emergency room 08/27/2013 after receiving anti-emetics and intravenous fluids. She was admitted 08/28/2013 by Dr. Marin Olp after consultation with Dr. Lucretia Field.  She was placed on intravenous hydration, antibiotics, and IV steroids. A CT of the abdomen 08/27/2013 revealed no acute finding. The pancreas appeared normal. No bowel wall  thickening. Diffusely fluid filled small bowel and colon.  She had no abdominal pain, nausea, or fever during this hospital admission. A stool sample is negative for the C. difficile toxin.  Antibiotics were discontinued 08/29/2013. She had no watery diarrhea during this admission. She continued to have bowel movements after eating. She describes the bowel movements as "soft". She tolerated a bland diet on 08/29/2013. No further nausea or abdominal pain.  The lipase was normal on 08/28/2013 and 08/29/2013. The lipase was mildly elevated 08/30/2013. It is possible she has mild pancreatitis, potentially related to ipilumumab.  On the morning of 08/30/2013 she appears stable for discharge. She will follow a low-fat bland diet and push oral hydration. She will contact us for recurrent abdominal pain or increased diarrhea.  Discharge examination: HEENT-mild whitecoat over the tongue, no buccal thrush Lungs-clear bilaterally Cardiac-regular rate and rhythm Abdomen-soft and nontender, no hepatomegaly Vascular-no leg edema         Signed: Betsy Coder, MD 08/30/2013, 8:11 AM

## 2013-08-31 ENCOUNTER — Telehealth: Payer: Self-pay | Admitting: Oncology

## 2013-08-31 ENCOUNTER — Telehealth: Payer: Self-pay | Admitting: Internal Medicine

## 2013-08-31 ENCOUNTER — Other Ambulatory Visit: Payer: Self-pay | Admitting: *Deleted

## 2013-08-31 ENCOUNTER — Telehealth: Payer: Self-pay | Admitting: *Deleted

## 2013-08-31 ENCOUNTER — Telehealth: Payer: Self-pay | Admitting: Nutrition

## 2013-08-31 DIAGNOSIS — C799 Secondary malignant neoplasm of unspecified site: Secondary | ICD-10-CM

## 2013-08-31 DIAGNOSIS — C439 Malignant melanoma of skin, unspecified: Secondary | ICD-10-CM

## 2013-08-31 NOTE — Telephone Encounter (Signed)
Message from Dr. Harriet Masson with an update on plan: GI consult will be done at [facility]. Pt will follow up with Dr. Benay Spice as scheduled. Called pt to confirm lab appt for 6/8 and lab/ office 6/15. Pt aware of appts. Stated she called for appt with Dr. Olevia Perches but was told she is booked through August. Pt stated she will see GI at [facility]. Will make Dr. Benay Spice aware.

## 2013-08-31 NOTE — Telephone Encounter (Signed)
s/w pt re appt for 7/8. other appts remain the same. lmonvm for BN per pt asking if she will call her. pt forwared to desk nurse re specific lab orders.

## 2013-08-31 NOTE — Telephone Encounter (Signed)
I had pt on [name], can cause colitis. But she should keep her appointm at [facility] and come back to [name] if her doctors want a follow up at home.

## 2013-08-31 NOTE — Telephone Encounter (Signed)
Patient notified of recommendations. 

## 2013-08-31 NOTE — ED Provider Notes (Signed)
Medical screening examination/treatment/procedure(s) were conducted as a shared visit with non-physician practitioner(s) and myself.  I personally evaluated the patient during the encounter.   EKG Interpretation   Date/Time:  Thursday August 27 2013 16:26:25 EDT Ventricular Rate:  68 PR Interval:  139 QRS Duration: 90 QT Interval:  367 QTC Calculation: 390 R Axis:   -9 Text Interpretation:  Atrial-paced rhythm Low voltage, precordial leads  Nonspecific T abnormalities, anterior leads No significant change since  last tracing Confirmed by Clearence Vitug  MD, Ivah Girardot ([id number]) on 08/27/2013 4:30:39 PM      [name] is a 58 y.o. female hx of afib with pacemaker, melanoma, recent colitis requiring hospitalization here with nausea and vomiting and abdominal pain. Started today. Had similar symptoms several days ago and had CT chest/ab/pel at [facility] that showed early colitis but not placed on abx. Vitals stable. Chronically ill, slightly dehydrated. Cardiac and lung exam unremarkable. Abdomen nontender. Labs at baseline, give IVF and zofran. Xray showed possible SBO. CT confirmed gastroenteritis. Recommend zofran prn, hydration at home.    Wandra Arthurs, MD 08/31/13 [phone]

## 2013-08-31 NOTE — Telephone Encounter (Signed)
Patient has been going to [facility] for chemotherapy treatment of met. Melanoma. She is being treated with [name](Itilimiumav). She was hospitalized over the weekend with pancreatitis and colitis d/t the chemotherapy drugs. She is asking if any doctors here have worked with patients taking [name]. She wants to have someone that is familiar with its side effects. She states Dr. Benay Spice told her that it is not used much at the cancer center here. She states she is going to call [facility] for a GI MD. Wanted to see if Dr. Olevia Perches had worked with any patients taking the drug and if she had, she would like to be seen here. Please, advise.

## 2013-08-31 NOTE — Telephone Encounter (Signed)
Patient continues to complain of diarrhea.  She reports extreme diarrhea every time she eats.  States she was in hospital for pancreatitis and colitis.  Still wondering what she can eat that won't cause diarrhea.  Recommended patient continue bland, low fiber diet with clear liquids and follow MD advice regarding diet advancement and medication recommendations.  Patient states MD wants to avoid medications that will help diarrhea because it "lessens the effect of the [name]".

## 2013-09-02 ENCOUNTER — Other Ambulatory Visit: Payer: Self-pay | Admitting: *Deleted

## 2013-09-02 ENCOUNTER — Other Ambulatory Visit: Payer: BC Managed Care – PPO

## 2013-09-02 ENCOUNTER — Other Ambulatory Visit (HOSPITAL_BASED_OUTPATIENT_CLINIC_OR_DEPARTMENT_OTHER): Payer: BC Managed Care – PPO

## 2013-09-02 DIAGNOSIS — C439 Malignant melanoma of skin, unspecified: Secondary | ICD-10-CM

## 2013-09-02 DIAGNOSIS — C4360 Malignant melanoma of unspecified upper limb, including shoulder: Secondary | ICD-10-CM | POA: Diagnosis not present

## 2013-09-02 DIAGNOSIS — C799 Secondary malignant neoplasm of unspecified site: Secondary | ICD-10-CM

## 2013-09-02 LAB — COMPREHENSIVE METABOLIC PANEL (CC13)
ALT: 28 U/L (ref 0–55)
AST: 17 U/L (ref 5–34)
Albumin: 3.6 g/dL (ref 3.5–5.0)
Alkaline Phosphatase: 65 U/L (ref 40–150)
Anion Gap: 8 mEq/L (ref 3–11)
BUN: 10.3 mg/dL (ref 7.0–26.0)
CO2: 27 mEq/L (ref 22–29)
Calcium: 9.5 mg/dL (ref 8.4–10.4)
Chloride: 107 mEq/L (ref 98–109)
Creatinine: 0.9 mg/dL (ref 0.6–1.1)
Glucose: 92 mg/dl (ref 70–140)
Potassium: 3.7 mEq/L (ref 3.5–5.1)
Sodium: 142 mEq/L (ref 136–145)
Total Bilirubin: 0.42 mg/dL (ref 0.20–1.20)
Total Protein: 6.7 g/dL (ref 6.4–8.3)

## 2013-09-02 LAB — LIPASE: Lipase: 104 U/L — ABNORMAL HIGH (ref 0–75)

## 2013-09-02 NOTE — Progress Notes (Signed)
Faxed lipase/Cmet to Dr. [employer] office #[phone]. Cmet was normal, but lipase was elevated at 104.

## 2013-09-02 NOTE — Progress Notes (Signed)
Patient called to move her labs up to 12:00 today-wants to wait for results. Has to make a decision about going to a wedding this weekend based on results.

## 2013-09-03 ENCOUNTER — Telehealth: Payer: Self-pay

## 2013-09-03 NOTE — Telephone Encounter (Signed)
Called to inform patient lipase was mildly elevated, informed patient no need to treat right now unless she is experiencing symptoms of pancreatitis. Patient states she is only having loose stools and some mild abdominal pain. Informed her if her abdominal pain worsened, is she developed a fever, vomiting or any more symptoms to call. Patient questioned if she should still be introducing new foods that her and Dr. Benay Spice talked about at the last visit. Talked with Dr. Benay Spice and he is okay with patient eating what she can tolerate. Also informed patient we made Dr. Lucretia Field aware of lipase levels. Patient denies any more questions or concerns at this time.

## 2013-09-06 DIAGNOSIS — C439 Malignant melanoma of skin, unspecified: Secondary | ICD-10-CM | POA: Diagnosis not present

## 2013-09-06 DIAGNOSIS — Z5181 Encounter for therapeutic drug level monitoring: Secondary | ICD-10-CM | POA: Diagnosis not present

## 2013-09-06 DIAGNOSIS — G909 Disorder of the autonomic nervous system, unspecified: Secondary | ICD-10-CM | POA: Diagnosis not present

## 2013-09-06 DIAGNOSIS — C719 Malignant neoplasm of brain, unspecified: Secondary | ICD-10-CM | POA: Diagnosis not present

## 2013-09-06 DIAGNOSIS — Z452 Encounter for adjustment and management of vascular access device: Secondary | ICD-10-CM | POA: Diagnosis not present

## 2013-09-06 DIAGNOSIS — Z483 Aftercare following surgery for neoplasm: Secondary | ICD-10-CM | POA: Diagnosis not present

## 2013-09-07 ENCOUNTER — Other Ambulatory Visit: Payer: Self-pay | Admitting: Internal Medicine

## 2013-09-08 ENCOUNTER — Other Ambulatory Visit: Payer: Self-pay | Admitting: Radiation Therapy

## 2013-09-08 DIAGNOSIS — C7931 Secondary malignant neoplasm of brain: Secondary | ICD-10-CM

## 2013-09-08 DIAGNOSIS — K5289 Other specified noninfective gastroenteritis and colitis: Secondary | ICD-10-CM | POA: Diagnosis not present

## 2013-09-08 DIAGNOSIS — C7949 Secondary malignant neoplasm of other parts of nervous system: Principal | ICD-10-CM

## 2013-09-08 DIAGNOSIS — C439 Malignant melanoma of skin, unspecified: Secondary | ICD-10-CM | POA: Diagnosis not present

## 2013-09-08 DIAGNOSIS — R197 Diarrhea, unspecified: Secondary | ICD-10-CM | POA: Diagnosis not present

## 2013-09-09 ENCOUNTER — Ambulatory Visit (HOSPITAL_BASED_OUTPATIENT_CLINIC_OR_DEPARTMENT_OTHER): Payer: Medicare Other | Admitting: Oncology

## 2013-09-09 ENCOUNTER — Other Ambulatory Visit (HOSPITAL_BASED_OUTPATIENT_CLINIC_OR_DEPARTMENT_OTHER): Payer: BC Managed Care – PPO

## 2013-09-09 ENCOUNTER — Telehealth: Payer: Self-pay | Admitting: Oncology

## 2013-09-09 ENCOUNTER — Other Ambulatory Visit: Payer: Self-pay | Admitting: *Deleted

## 2013-09-09 ENCOUNTER — Telehealth: Payer: Self-pay | Admitting: *Deleted

## 2013-09-09 VITALS — BP 112/87 | HR 78 | Temp 97.7°F | Resp 18 | Ht 63.0 in | Wt 152.8 lb

## 2013-09-09 DIAGNOSIS — R911 Solitary pulmonary nodule: Secondary | ICD-10-CM

## 2013-09-09 DIAGNOSIS — C439 Malignant melanoma of skin, unspecified: Secondary | ICD-10-CM

## 2013-09-09 DIAGNOSIS — C799 Secondary malignant neoplasm of unspecified site: Secondary | ICD-10-CM

## 2013-09-09 DIAGNOSIS — C4360 Malignant melanoma of unspecified upper limb, including shoulder: Secondary | ICD-10-CM | POA: Diagnosis not present

## 2013-09-09 DIAGNOSIS — C7949 Secondary malignant neoplasm of other parts of nervous system: Secondary | ICD-10-CM

## 2013-09-09 DIAGNOSIS — Z86718 Personal history of other venous thrombosis and embolism: Secondary | ICD-10-CM

## 2013-09-09 DIAGNOSIS — C7931 Secondary malignant neoplasm of brain: Secondary | ICD-10-CM | POA: Diagnosis not present

## 2013-09-09 DIAGNOSIS — R197 Diarrhea, unspecified: Secondary | ICD-10-CM | POA: Diagnosis not present

## 2013-09-09 LAB — LIPASE: Lipase: 88 U/L — ABNORMAL HIGH (ref 0–75)

## 2013-09-09 LAB — COMPREHENSIVE METABOLIC PANEL (CC13)
ALT: 20 U/L (ref 0–55)
AST: 17 U/L (ref 5–34)
Albumin: 3.6 g/dL (ref 3.5–5.0)
Alkaline Phosphatase: 77 U/L (ref 40–150)
Anion Gap: 10 mEq/L (ref 3–11)
BUN: 11.9 mg/dL (ref 7.0–26.0)
CO2: 24 mEq/L (ref 22–29)
Calcium: 9.6 mg/dL (ref 8.4–10.4)
Chloride: 107 mEq/L (ref 98–109)
Creatinine: 0.9 mg/dL (ref 0.6–1.1)
Glucose: 87 mg/dl (ref 70–140)
Potassium: 3.5 mEq/L (ref 3.5–5.1)
Sodium: 141 mEq/L (ref 136–145)
Total Bilirubin: 0.37 mg/dL (ref 0.20–1.20)
Total Protein: 6.8 g/dL (ref 6.4–8.3)

## 2013-09-09 LAB — LACTATE DEHYDROGENASE (CC13): LDH: 309 U/L — ABNORMAL HIGH (ref 125–245)

## 2013-09-09 NOTE — Telephone Encounter (Signed)
Call from pt to follow up on lipase level. Left message on voicemail informing her the result will be back 09/10/13.

## 2013-09-09 NOTE — Progress Notes (Signed)
[place] OFFICE PROGRESS NOTE   Diagnosis: Melanoma  INTERVAL HISTORY:   She returns as scheduled. She was admitted 08/28/2013 with abdominal pain and acute onset nausea and vomiting. Her symptoms resolved over night. She continues to have frequent bowel movements after eating. She was seen in gastroenterology at [facility] 09/08/2013. She has been placed on Levsin and a slow prednisone taper. She denies recurrent abdominal pain and nausea.  She complains of pain at the right gums. She is concerned this is related to "thrush ". Nystatin has not helped.  Objective:  Vital signs in last 24 hours:  Blood pressure 112/87, pulse 78, temperature 97.7 F (36.5 C), temperature source Oral, resp. rate 18, height 5' 3"  (1.6 m), weight 152 lb 12.8 oz (69.31 kg), SpO2 100.00%.    HEENT: Mild whitecoat at the top surface of the tongue, no buccal thrush, no ulcers, no visible thrush at the gums. Lymphatics: No cervical or supraclavicular nodes Resp: Lungs clear bilaterally Cardio: Regular in rhythm GI: No hepatosplenomegaly, nontender, no mass Vascular: No leg edema    Lab Results:  Lab Results  Component Value Date   WBC 4.2 08/29/2013   HGB 11.8* 08/29/2013   HCT 36.1 08/29/2013   MCV 85.7 08/29/2013   PLT 234 08/29/2013   NEUTROABS 6.3 08/27/2013    Imaging:  No results found.  Medications: I have reviewed the patient's current medications.  Assessment/Plan: 1.Metastatic melanoma  -Status post resection of a right arm melanoma, stage II A (T2b N0) in December 2009  -Metastatic melanoma, status post resection of a left cerebellar metastasis 09/30/2012, BRAF mutation identified  -Status post SRS treatment of the left cerebellum and a left parietal metastasis 10/24/2012  -Staging CTs and PET scan without evidence of distant metastatic disease aside from indeterminate small lung nodules  -Restaging CTs 12/01/2012 consistent with enlargement of the left parietal mass and right  lower lobe lung nodule  -Initiation of dabrafenib and tremetinib 12/17/2012. She reports extremely poor tolerance. Discontinued.  -Status post resection of a left parietal metastasis 02/09/2013.  -Restaging CT evaluation 03/03/2013. Stable 1.5 cm right lower lobe pulmonary nodule. Multiple peripheral enhancing lesions throughout the liver.  -Initiation of Ipilimumab at [facility] 03/10/2013.  -Cycle 2 Ipilumumab 04/17/2013  -Cycle 3 Ipilumumab 05/14/2013  -Cycle 4 Ipilumumab 06/04/2013  -Restaging CT at [facility] 06/25/2013 with multiple subcentimeter liver lesions, no new lesions, decreased right lower lobe nodule.  -Brain MRI 07/07/2013 with no evidence of progressive metastatic disease.  2. Arrhythmia, pacemaker in place.  3. Hypertension  4. History of a CVA  5. History of deep vein thrombosis  6. History of proximal leg weakness-likely related to chronic steroid use.  7. Intermittent rash at the posterior thighs and left hand-likely related to systemic therapy.  8. Leg edema-? Related to liver disease,? Secondary to ipilumumab . Improved on furosemide. Currently not taking furosemide.  9. Headache and mild nausea-coincided with discontinuation of Decadron, CT of the brain 03/25/2011 with no acute change.  10. 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 She is on a prednisone taper and is now followed in gastroenterology at [facility] 11. Mild elevation of lipase-likely mild asymptomatic pancreatitis related to Ipilumumab    Disposition:  Ms. Dupree remains in clinical remission from metastatic melanoma. She continues to have frequent loose bowel movements secondary to ipilumumab enteritis. She will continue medical management of the diarrhea per the recommendations of gastroenterology at [facility].  She plans to continue imaging followup with Dr. Lucretia Field. She will return for a  lab visit in 2 weeks and an office visit in one month. She will contact us in the interim if she has increased diarrhea or new  symptoms.  She does not appear to have significant oral candidiasis today. I recommended she followup with her dentist or a periodontist to evaluate the gum pain.  Betsy Coder, MD  09/09/2013  10:11 AM

## 2013-09-09 NOTE — Telephone Encounter (Signed)
[facility] ADN PRINTED APPT SCHED AND AVS FOR PT FOR [place] AND aUG

## 2013-09-11 ENCOUNTER — Encounter (HOSPITAL_COMMUNITY): Payer: Self-pay

## 2013-09-13 DIAGNOSIS — Z483 Aftercare following surgery for neoplasm: Secondary | ICD-10-CM | POA: Diagnosis not present

## 2013-09-13 DIAGNOSIS — C439 Malignant melanoma of skin, unspecified: Secondary | ICD-10-CM | POA: Diagnosis not present

## 2013-09-13 DIAGNOSIS — C719 Malignant neoplasm of brain, unspecified: Secondary | ICD-10-CM | POA: Diagnosis not present

## 2013-09-13 DIAGNOSIS — Z452 Encounter for adjustment and management of vascular access device: Secondary | ICD-10-CM | POA: Diagnosis not present

## 2013-09-13 DIAGNOSIS — G909 Disorder of the autonomic nervous system, unspecified: Secondary | ICD-10-CM | POA: Diagnosis not present

## 2013-09-13 DIAGNOSIS — Z5181 Encounter for therapeutic drug level monitoring: Secondary | ICD-10-CM | POA: Diagnosis not present

## 2013-09-16 ENCOUNTER — Telehealth: Payer: Self-pay | Admitting: *Deleted

## 2013-09-16 NOTE — Telephone Encounter (Signed)
Received message from pt asking for results of lipase from last week.  Per Dr. Benay Spice; notified pt that lipase is down to 88 from 104.  Pt verbalized understanding and confirmed lab only appt 09/23/13 and lab/MD appt for 10/15/13.

## 2013-09-19 ENCOUNTER — Other Ambulatory Visit: Payer: Self-pay | Admitting: Nurse Practitioner

## 2013-09-19 DIAGNOSIS — E876 Hypokalemia: Secondary | ICD-10-CM

## 2013-09-20 DIAGNOSIS — Z452 Encounter for adjustment and management of vascular access device: Secondary | ICD-10-CM | POA: Diagnosis not present

## 2013-09-20 DIAGNOSIS — C439 Malignant melanoma of skin, unspecified: Secondary | ICD-10-CM | POA: Diagnosis not present

## 2013-09-20 DIAGNOSIS — G909 Disorder of the autonomic nervous system, unspecified: Secondary | ICD-10-CM | POA: Diagnosis not present

## 2013-09-20 DIAGNOSIS — Z5181 Encounter for therapeutic drug level monitoring: Secondary | ICD-10-CM | POA: Diagnosis not present

## 2013-09-20 DIAGNOSIS — Z483 Aftercare following surgery for neoplasm: Secondary | ICD-10-CM | POA: Diagnosis not present

## 2013-09-20 DIAGNOSIS — C719 Malignant neoplasm of brain, unspecified: Secondary | ICD-10-CM | POA: Diagnosis not present

## 2013-09-23 ENCOUNTER — Other Ambulatory Visit (HOSPITAL_BASED_OUTPATIENT_CLINIC_OR_DEPARTMENT_OTHER): Payer: Medicare Other

## 2013-09-23 ENCOUNTER — Telehealth: Payer: Self-pay | Admitting: *Deleted

## 2013-09-23 DIAGNOSIS — C7931 Secondary malignant neoplasm of brain: Secondary | ICD-10-CM | POA: Diagnosis not present

## 2013-09-23 DIAGNOSIS — C799 Secondary malignant neoplasm of unspecified site: Secondary | ICD-10-CM

## 2013-09-23 DIAGNOSIS — C4360 Malignant melanoma of unspecified upper limb, including shoulder: Secondary | ICD-10-CM | POA: Diagnosis not present

## 2013-09-23 DIAGNOSIS — C7949 Secondary malignant neoplasm of other parts of nervous system: Secondary | ICD-10-CM | POA: Diagnosis not present

## 2013-09-23 DIAGNOSIS — C439 Malignant melanoma of skin, unspecified: Secondary | ICD-10-CM

## 2013-09-23 LAB — CBC WITH DIFFERENTIAL/PLATELET
BASO%: 0.9 % (ref 0.0–2.0)
Basophils Absolute: 0.1 10*3/uL (ref 0.0–0.1)
EOS%: 1.4 % (ref 0.0–7.0)
Eosinophils Absolute: 0.1 10*3/uL (ref 0.0–0.5)
HCT: 37.7 % (ref 34.8–46.6)
HGB: 12.3 g/dL (ref 11.6–15.9)
LYMPH%: 32.5 % (ref 14.0–49.7)
MCH: 28.6 pg (ref 25.1–34.0)
MCHC: 32.6 g/dL (ref 31.5–36.0)
MCV: 87.7 fL (ref 79.5–101.0)
MONO#: 1 10*3/uL — ABNORMAL HIGH (ref 0.1–0.9)
MONO%: 11.7 % (ref 0.0–14.0)
NEUT#: 4.6 10*3/uL (ref 1.5–6.5)
NEUT%: 53.5 % (ref 38.4–76.8)
Platelets: 273 10*3/uL (ref 145–400)
RBC: 4.3 10*6/uL (ref 3.70–5.45)
RDW: 15.3 % — ABNORMAL HIGH (ref 11.2–14.5)
WBC: 8.7 10*3/uL (ref 3.9–10.3)
lymph#: 2.8 10*3/uL (ref 0.9–3.3)

## 2013-09-23 LAB — COMPREHENSIVE METABOLIC PANEL (CC13)
ALT: 22 U/L (ref 0–55)
AST: 19 U/L (ref 5–34)
Albumin: 3.8 g/dL (ref 3.5–5.0)
Alkaline Phosphatase: 76 U/L (ref 40–150)
Anion Gap: 8 mEq/L (ref 3–11)
BUN: 14.9 mg/dL (ref 7.0–26.0)
CO2: 25 mEq/L (ref 22–29)
Calcium: 9.5 mg/dL (ref 8.4–10.4)
Chloride: 108 mEq/L (ref 98–109)
Creatinine: 0.8 mg/dL (ref 0.6–1.1)
Glucose: 70 mg/dl (ref 70–140)
Potassium: 3.7 mEq/L (ref 3.5–5.1)
Sodium: 141 mEq/L (ref 136–145)
Total Bilirubin: 0.37 mg/dL (ref 0.20–1.20)
Total Protein: 6.9 g/dL (ref 6.4–8.3)

## 2013-09-23 LAB — LIPASE: Lipase: 128 U/L — ABNORMAL HIGH (ref 0–75)

## 2013-09-23 LAB — LACTATE DEHYDROGENASE (CC13): LDH: 293 U/L — ABNORMAL HIGH (ref 125–245)

## 2013-09-23 NOTE — Telephone Encounter (Signed)
Call from pt expressing concern about elevated lipase level. Asks what Dr. Benay Spice thinks? Should she be worried? Will call pt once MD has reviewed lab.

## 2013-09-27 DIAGNOSIS — Z452 Encounter for adjustment and management of vascular access device: Secondary | ICD-10-CM | POA: Diagnosis not present

## 2013-09-27 DIAGNOSIS — G909 Disorder of the autonomic nervous system, unspecified: Secondary | ICD-10-CM | POA: Diagnosis not present

## 2013-09-27 DIAGNOSIS — Z5181 Encounter for therapeutic drug level monitoring: Secondary | ICD-10-CM | POA: Diagnosis not present

## 2013-09-27 DIAGNOSIS — C439 Malignant melanoma of skin, unspecified: Secondary | ICD-10-CM | POA: Diagnosis not present

## 2013-09-27 DIAGNOSIS — C719 Malignant neoplasm of brain, unspecified: Secondary | ICD-10-CM | POA: Diagnosis not present

## 2013-09-27 DIAGNOSIS — Z483 Aftercare following surgery for neoplasm: Secondary | ICD-10-CM | POA: Diagnosis not present

## 2013-09-28 ENCOUNTER — Telehealth: Payer: Self-pay | Admitting: *Deleted

## 2013-09-28 NOTE — Telephone Encounter (Signed)
Returned pt's call: Elevated lipase is common effect from your chemo treatment. No need for concern unless you become symptomatic, per Dr. Benay Spice. Pt voiced understanding. Stated she feels better, diarrhea has resolved. Sees GI MD at [facility] 10/03/13.

## 2013-10-01 ENCOUNTER — Ambulatory Visit: Payer: BC Managed Care – PPO

## 2013-10-09 ENCOUNTER — Ambulatory Visit
Admission: RE | Admit: 2013-10-09 | Discharge: 2013-10-09 | Disposition: A | Discharge status: Home / Self Care | Payer: BC Managed Care – PPO | Source: Ambulatory Visit | Attending: Radiation Oncology | Admitting: Radiation Oncology

## 2013-10-09 ENCOUNTER — Encounter: Payer: Self-pay | Admitting: Radiation Oncology

## 2013-10-09 ENCOUNTER — Ambulatory Visit (HOSPITAL_COMMUNITY)
Admission: RE | Admit: 2013-10-09 | Discharge: 2013-10-09 | Disposition: A | Discharge status: Home / Self Care | Payer: Medicare Other | Source: Ambulatory Visit | Attending: Radiation Oncology | Admitting: Radiation Oncology

## 2013-10-09 ENCOUNTER — Encounter (HOSPITAL_COMMUNITY): Payer: Self-pay

## 2013-10-09 VITALS — BP 104/86 | HR 88 | Temp 98.1°F | Resp 20 | Ht 63.0 in | Wt 162.8 lb

## 2013-10-09 DIAGNOSIS — C7931 Secondary malignant neoplasm of brain: Secondary | ICD-10-CM

## 2013-10-09 DIAGNOSIS — C439 Malignant melanoma of skin, unspecified: Secondary | ICD-10-CM | POA: Diagnosis not present

## 2013-10-09 DIAGNOSIS — C7949 Secondary malignant neoplasm of other parts of nervous system: Principal | ICD-10-CM

## 2013-10-09 MED ORDER — IOHEXOL 300 MG/ML  SOLN
100.0000 mL | Freq: Once | INTRAMUSCULAR | Status: AC | PRN
  Administered 2013-10-09: 100 mL via INTRAVENOUS

## 2013-10-09 NOTE — Progress Notes (Signed)
Follow up  Brain mets, had CT head today  Here for results, no c/o pain, nausea, no head aches, no dizzy ness, drinks constantly says she has been off lasix but feels she needs to go back on,  Appetite getting better,  Colitis has resolved, eating chicken,fish,turkey, no red meats, eating cooked vegetables, see Gi MD DR. Linton Rump in [place] Tuesday,  2:37 PM

## 2013-10-09 NOTE — Progress Notes (Signed)
Radiation Oncology         [phone] ________________________________  Name: [name] MRN: [id number]  Date: 10/09/2013  DOB: 07-27-1955  Follow-Up Visit Note  CC: [name], MD  Regis Bill Standley Brooking, MD  Diagnosis:   Metastatic melanoma with brain metastasis   Narrative:  The patient returns today for routine follow-up.  Patient indicates that she is doing well today. She returns for a routine followup visit. She denies any ongoing difficulties with headache, nausea, in vision changes. Overall she feels well. She has had difficulty with colitis secondary to systemic treatment but this is much improved. She has been able to taper off of steroids within the last couple of days.  The patient had a CT scan of the head (the patient has a pacemaker which makes obtaining a brain MRI scan quite difficult), and she presents today to review this study. This was completed earlier today.                              ALLERGIES:  is allergic to doxycycline; erythromycin; penicillins; preparation h; promethazine; sulfonamide derivatives; and phenergan.  Meds: Current Outpatient Prescriptions  Medication Sig Dispense Refill  . acetaminophen (TYLENOL) 500 MG tablet Take 1,000 mg by mouth every 6 (six) hours as needed for pain.      Marland Kitchen albuterol (VENTOLIN HFA) 108 (90 BASE) MCG/ACT inhaler Inhale 2 puffs into the lungs as needed.      . clonazePAM (KLONOPIN) 0.5 MG tablet Take 1 tablet (0.5 mg total) by mouth 3 (three) times daily as needed for anxiety.  50 tablet  0  . cloNIDine (CATAPRES) 0.1 MG tablet Take 1 tablet (0.1 mg total) by mouth at bedtime.  90 tablet  1  . CRESTOR 5 MG tablet TAKE ONE TABLET BY MOUTH ONE TIME DAILY   90 tablet  0  . cyanocobalamin (,VITAMIN B-12,) 1000 MCG/ML injection INJECT 1 ML UNDER THE SKIN AS DIRECTED ONCE A MONTH  3 mL  0  . diphenhydrAMINE (BENADRYL) 25 MG tablet Take 12.5 mg by mouth every 6 (six) hours as needed. Seasonal allergies      .  esomeprazole (NEXIUM) 40 MG capsule Take 40 mg by mouth 2 (two) times daily.      . hydrocortisone (ANUSOL-HC) 25 MG suppository Place 25 mg rectally at bedtime as needed for hemorrhoids.      . hydrocortisone 2.5 % cream Apply 1 application topically 2 (two) times daily as needed (irritation).       . hydrocortisone-pramoxine (ANALPRAM-HC) 2.5-1 % rectal cream Place 1 application rectally at bedtime as needed for hemorrhoids.      . levETIRAcetam (KEPPRA) 500 MG tablet Take 500 mg by mouth 2 (two) times daily.      . mesalamine (CANASA) 1000 MG suppository Place 1,000 mg rectally at bedtime as needed (hemorrhoids).       . metFORMIN (GLUCOPHAGE) 500 MG tablet Take 500 mg by mouth daily after breakfast.      . metoprolol tartrate (LOPRESSOR) 25 MG tablet Take 25 mg by mouth 4 (four) times daily.      Marland Kitchen nystatin (MYCOSTATIN) 100000 UNIT/ML suspension Take 5 mLs (500,000 Units total) by mouth 4 (four) times daily as needed.  240 mL  1  . ondansetron (ZOFRAN-ODT) 4 MG disintegrating tablet Take 4 mg by mouth as needed. 4mg  ODT q4 hours prn nausea/vomit      . polyethylene glycol powder (GLYCOLAX/MIRALAX)  powder Stir 17 grams in 8oz of water, or juice, 1 or 2 times daily for constipation   1054 g  2  . potassium chloride 20 MEQ/15ML (10%) solution Take 47mls by mouth 3 times daily  900 mL  2  . rosuvastatin (CRESTOR) 5 MG tablet Take 5 mg by mouth daily.      . TGT ANTACID ANTI-GAS 200-200-20 MG/5ML suspension       . Vitamin D, Ergocalciferol, (DRISDOL) 50000 UNITS CAPS capsule Take 50,000 Units by mouth every 7 (seven) days.      . hyoscyamine (LEVSIN SL) 0.125 MG SL tablet Place 0.125 mg under the tongue 4 (four) times daily -  before meals and at bedtime.      . hyoscyamine (LEVSIN, ANASPAZ) 0.125 MG tablet Take 2 tablets (0.25 mg total) by mouth every 4 (four) hours as needed for cramping.  30 tablet  1   No current facility-administered medications for this encounter.    Physical  Findings: The patient is in no acute distress. Patient is alert and oriented.  height is 5\' 3"  (1.6 m) and weight is 162 lb 12.8 oz (73.846 kg). Her oral temperature is 98.1 F (36.7 C). Her blood pressure is 104/86 and her pulse is 88. Her respiration is 20 and oxygen saturation is 99%. .     Lab Findings: Lab Results  Component Value Date   WBC 8.7 09/23/2013   HGB 12.3 09/23/2013   HCT 37.7 09/23/2013   MCV 87.7 09/23/2013   PLT 273 09/23/2013     Radiographic Findings: Ct Head W Wo Contrast  10/09/2013   CLINICAL DATA:  Metastatic melanoma with metastases to the brain.  EXAM: CT HEAD WITHOUT AND WITH CONTRAST  TECHNIQUE: Contiguous axial images were obtained from the base of the skull through the vertex without and with intravenous contrast  CONTRAST:  164mL OMNIPAQUE IOHEXOL 300 MG/ML  SOLN  COMPARISON:  MRI brain without and with contrast 07/07/2013.  FINDINGS: Patient is status post resection of an inferior left cerebellar lesion. Encephalomalacia is stable. There is no associated enhancement.  Resection of a left parietal lesion is noted as well. Subtle enhancement along the dura is similar to the prior MRI studies. No definite tumor growth is evident.  No new areas of pathologic enhancement are evident.  No acute cortical infarct, hemorrhage, or mass lesion is present. The paranasal sinuses and mastoid air cells are clear. Apart from the craniotomies, the osseous skull is intact.  IMPRESSION: 1. Stable postsurgical changes of the left inferior cerebellum. 2. Stable postsurgical changes of the left parietal lobe. A 7 mm focal area of enhancement along the dura is stable.   Electronically Signed   By: Lawrence Santiago M.D.   On: 10/09/2013 14:20    Impression:    The patient scan looked good with no findings concerning for recurrent or progressive disease. Overall it appears she is doing well at this time.   Plan:  We will review her recent CT scan of the head in brain conference on Monday  and we will continue with routine followup through her brain program here in [place].  I spent 15 minutes with the patient today, the majority of which was spent counseling the patient on the diagnosis of cancer and coordinating care.   Jodelle Gross, M.D., Ph.D.

## 2013-10-11 DIAGNOSIS — Z452 Encounter for adjustment and management of vascular access device: Secondary | ICD-10-CM | POA: Diagnosis not present

## 2013-10-11 DIAGNOSIS — Z5181 Encounter for therapeutic drug level monitoring: Secondary | ICD-10-CM | POA: Diagnosis not present

## 2013-10-11 DIAGNOSIS — Z483 Aftercare following surgery for neoplasm: Secondary | ICD-10-CM | POA: Diagnosis not present

## 2013-10-11 DIAGNOSIS — G909 Disorder of the autonomic nervous system, unspecified: Secondary | ICD-10-CM | POA: Diagnosis not present

## 2013-10-11 DIAGNOSIS — C719 Malignant neoplasm of brain, unspecified: Secondary | ICD-10-CM | POA: Diagnosis not present

## 2013-10-11 DIAGNOSIS — C439 Malignant melanoma of skin, unspecified: Secondary | ICD-10-CM | POA: Diagnosis not present

## 2013-10-12 ENCOUNTER — Ambulatory Visit: Payer: BC Managed Care – PPO | Admitting: Radiation Oncology

## 2013-10-13 DIAGNOSIS — R197 Diarrhea, unspecified: Secondary | ICD-10-CM | POA: Diagnosis not present

## 2013-10-13 DIAGNOSIS — T451X5A Adverse effect of antineoplastic and immunosuppressive drugs, initial encounter: Secondary | ICD-10-CM | POA: Diagnosis not present

## 2013-10-13 DIAGNOSIS — K5900 Constipation, unspecified: Secondary | ICD-10-CM | POA: Diagnosis not present

## 2013-10-15 ENCOUNTER — Telehealth: Payer: Self-pay | Admitting: Oncology

## 2013-10-15 ENCOUNTER — Other Ambulatory Visit (HOSPITAL_BASED_OUTPATIENT_CLINIC_OR_DEPARTMENT_OTHER): Payer: Medicare Other

## 2013-10-15 ENCOUNTER — Ambulatory Visit (HOSPITAL_BASED_OUTPATIENT_CLINIC_OR_DEPARTMENT_OTHER): Payer: Medicare Other | Admitting: Oncology

## 2013-10-15 ENCOUNTER — Telehealth: Payer: Self-pay | Admitting: *Deleted

## 2013-10-15 VITALS — BP 109/80 | HR 77 | Temp 98.5°F | Resp 18 | Ht 63.0 in | Wt 159.7 lb

## 2013-10-15 DIAGNOSIS — C4360 Malignant melanoma of unspecified upper limb, including shoulder: Secondary | ICD-10-CM | POA: Diagnosis not present

## 2013-10-15 DIAGNOSIS — C7949 Secondary malignant neoplasm of other parts of nervous system: Secondary | ICD-10-CM

## 2013-10-15 DIAGNOSIS — C799 Secondary malignant neoplasm of unspecified site: Secondary | ICD-10-CM

## 2013-10-15 DIAGNOSIS — Z86718 Personal history of other venous thrombosis and embolism: Secondary | ICD-10-CM

## 2013-10-15 DIAGNOSIS — C439 Malignant melanoma of skin, unspecified: Secondary | ICD-10-CM | POA: Diagnosis not present

## 2013-10-15 DIAGNOSIS — C7931 Secondary malignant neoplasm of brain: Secondary | ICD-10-CM

## 2013-10-15 LAB — CBC WITH DIFFERENTIAL/PLATELET
BASO%: 0.4 % (ref 0.0–2.0)
Basophils Absolute: 0 10*3/uL (ref 0.0–0.1)
EOS%: 2.3 % (ref 0.0–7.0)
Eosinophils Absolute: 0.1 10*3/uL (ref 0.0–0.5)
HCT: 39 % (ref 34.8–46.6)
HGB: 12.8 g/dL (ref 11.6–15.9)
LYMPH%: 30.9 % (ref 14.0–49.7)
MCH: 28.7 pg (ref 25.1–34.0)
MCHC: 32.8 g/dL (ref 31.5–36.0)
MCV: 87.4 fL (ref 79.5–101.0)
MONO#: 0.7 10*3/uL (ref 0.1–0.9)
MONO%: 14 % (ref 0.0–14.0)
NEUT#: 2.6 10*3/uL (ref 1.5–6.5)
NEUT%: 52.4 % (ref 38.4–76.8)
Platelets: 244 10*3/uL (ref 145–400)
RBC: 4.46 10*6/uL (ref 3.70–5.45)
RDW: 13.6 % (ref 11.2–14.5)
WBC: 5 10*3/uL (ref 3.9–10.3)
lymph#: 1.5 10*3/uL (ref 0.9–3.3)

## 2013-10-15 LAB — COMPREHENSIVE METABOLIC PANEL (CC13)
ALT: 20 U/L (ref 0–55)
AST: 23 U/L (ref 5–34)
Albumin: 3.8 g/dL (ref 3.5–5.0)
Alkaline Phosphatase: 111 U/L (ref 40–150)
Anion Gap: 11 mEq/L (ref 3–11)
BUN: 15.7 mg/dL (ref 7.0–26.0)
CO2: 24 mEq/L (ref 22–29)
Calcium: 9.7 mg/dL (ref 8.4–10.4)
Chloride: 106 mEq/L (ref 98–109)
Creatinine: 0.9 mg/dL (ref 0.6–1.1)
Glucose: 92 mg/dl (ref 70–140)
Potassium: 4 mEq/L (ref 3.5–5.1)
Sodium: 141 mEq/L (ref 136–145)
Total Bilirubin: 0.27 mg/dL (ref 0.20–1.20)
Total Protein: 7 g/dL (ref 6.4–8.3)

## 2013-10-15 LAB — LIPASE: Lipase: 79 U/L — ABNORMAL HIGH (ref 0–75)

## 2013-10-15 LAB — LACTATE DEHYDROGENASE (CC13): LDH: 285 U/L — ABNORMAL HIGH (ref 125–245)

## 2013-10-15 NOTE — Telephone Encounter (Signed)
Per Dr. Benay Spice; faxed pt Lipase results per pt request--fax # [phone]

## 2013-10-15 NOTE — Progress Notes (Signed)
  [place] OFFICE PROGRESS NOTE   Diagnosis: Melanoma  INTERVAL HISTORY:   She returns as scheduled. The diarrhea has resolved. She discontinued prednisone 10/08/2013. No abdominal pain. No specific complaint. A brain CT on 10/09/2013 revealed no new area of pathologic enhancement. Stable postsurgical changes at the left cerebellum and left parietal lobe. Objective:  Vital signs in last 24 hours:  Blood pressure 109/80, pulse 77, temperature 98.5 F (36.9 C), temperature source Oral, resp. rate 18, height $RemoveBe'5\' 3"'ViUhoukmx$  (1.6 m), weight 159 lb 11.2 oz (72.439 kg), SpO2 100.00%.    HEENT: Mild whitecoat over the tongue, no buccal thrush Lymphatics: No cervical or supraclavicular nodes Resp: Lungs clear bilaterally Cardio: Regular rate and rhythm GI: No hepatomegaly, nontender, no mass Vascular: No leg edema  Skin: Right arm scar without evidence of recurrent tumor     Lab Results:  Lab Results  Component Value Date   WBC 5.0 10/15/2013   HGB 12.8 10/15/2013   HCT 39.0 10/15/2013   MCV 87.4 10/15/2013   PLT 244 10/15/2013   NEUTROABS 2.6 10/15/2013   Lipase pending    Medications: I have reviewed the patient's current medications.  Assessment/Plan: 1.Metastatic melanoma  -Status post resection of a right arm melanoma, stage II A (T2b N0) in December 2009  -Metastatic melanoma, status post resection of a left cerebellar metastasis 09/30/2012, BRAF mutation identified  -Status post SRS treatment of the left cerebellum and a left parietal metastasis 10/24/2012  -Staging CTs and PET scan without evidence of distant metastatic disease aside from indeterminate small lung nodules  -Restaging CTs 12/01/2012 consistent with enlargement of the left parietal mass and right lower lobe lung nodule  -Initiation of dabrafenib and tremetinib 12/17/2012. She reports extremely poor tolerance. Discontinued.  -Status post resection of a left parietal metastasis 02/09/2013.  -Restaging  CT evaluation 03/03/2013. Stable 1.5 cm right lower lobe pulmonary nodule. Multiple peripheral enhancing lesions throughout the liver.  -Initiation of Ipilimumab at [facility] 03/10/2013.  -Cycle 2 Ipilumumab 04/17/2013  -Cycle 3 Ipilumumab 05/14/2013  -Cycle 4 Ipilumumab 06/04/2013  -Restaging CT at [facility] 06/25/2013 with multiple subcentimeter liver lesions, no new lesions, decreased right lower lobe nodule.  -Brain MRI 07/07/2013 with no evidence of progressive metastatic disease. -Brain CT 10/10/2011 with no evidence of progressive metastatic disease  2. Arrhythmia, pacemaker in place.  3. Hypertension  4. History of a CVA  5. History of deep vein thrombosis  6. History of proximal leg weakness-likely related to chronic steroid use.  7. Intermittent rash at the posterior thighs and left hand-likely related to systemic therapy.  8. Leg edema-? Related to liver disease,? Secondary to ipilumumab . Improved on furosemide. Currently not taking furosemide.  9. Headache and mild nausea-coincided with discontinuation of Decadron, CT of the brain 03/25/2011 with no acute change.  10. Diarrhea secondary to Ipilumumab, resolved, prednisone has been tapered off 11. Mild elevation of lipase-likely mild asymptomatic pancreatitis related to Ipilumumab    Disposition:  There is no clinical evidence for progression of the metastatic melanoma. She remains off of specific therapy for melanoma after treatment with Ipilumumab. The enteritis has resolved. She will undergo restaging CTs at [facility] next month. She will return for an office visit here in one month.  Ms. Giacomo will contact Dr. Ellene Route to discuss the indication for continuing Keppra.  Betsy Coder, MD  10/15/2013  8:37 AM

## 2013-10-15 NOTE — Telephone Encounter (Signed)
gv and pirnted appt sched and av sfo rpt for SEpt

## 2013-10-16 ENCOUNTER — Telehealth: Payer: Self-pay | Admitting: Internal Medicine

## 2013-10-16 NOTE — Telephone Encounter (Signed)
Spoke with pt who reports she was recently weaned off steroids after being on for a year. Also was off Lasix for awhile due to colitis. Resumed Lasix on August 17,2015. She has edema in hands, feet, ankles. This is not new but she wanted to make Dr. Harrington Challenger aware. She is scheduled to see Dr. Harrington Challenger on October 23, 2013. She is reporting shortness of breath with exertion but this has not worsened. Swelling better when she takes Lasix. Is taking Lasix 40 mg daily. Will forward to Dr. Harrington Challenger for review.

## 2013-10-16 NOTE — Telephone Encounter (Signed)
Lm to call back

## 2013-10-16 NOTE — Telephone Encounter (Signed)
New message    Patient calling C/O edema in hands, feet, on prednisone for about a year.  Recent off the medication.    appt is made for Dr. Harrington Challenger on 8/28. @ 4:15 pm

## 2013-10-18 DIAGNOSIS — C439 Malignant melanoma of skin, unspecified: Secondary | ICD-10-CM | POA: Diagnosis not present

## 2013-10-18 DIAGNOSIS — Z452 Encounter for adjustment and management of vascular access device: Secondary | ICD-10-CM | POA: Diagnosis not present

## 2013-10-18 DIAGNOSIS — G909 Disorder of the autonomic nervous system, unspecified: Secondary | ICD-10-CM | POA: Diagnosis not present

## 2013-10-18 DIAGNOSIS — Z483 Aftercare following surgery for neoplasm: Secondary | ICD-10-CM | POA: Diagnosis not present

## 2013-10-18 DIAGNOSIS — C719 Malignant neoplasm of brain, unspecified: Secondary | ICD-10-CM | POA: Diagnosis not present

## 2013-10-18 DIAGNOSIS — Z5181 Encounter for therapeutic drug level monitoring: Secondary | ICD-10-CM | POA: Diagnosis not present

## 2013-10-20 NOTE — Telephone Encounter (Signed)
Called pt   Keep appt Fri

## 2013-10-23 ENCOUNTER — Encounter: Payer: Self-pay | Admitting: Internal Medicine

## 2013-10-23 ENCOUNTER — Ambulatory Visit (INDEPENDENT_AMBULATORY_CARE_PROVIDER_SITE_OTHER): Payer: BC Managed Care – PPO | Admitting: Internal Medicine

## 2013-10-23 VITALS — BP 110/86 | HR 74 | Ht 63.0 in | Wt 161.0 lb

## 2013-10-23 DIAGNOSIS — I471 Supraventricular tachycardia: Secondary | ICD-10-CM

## 2013-10-23 DIAGNOSIS — I6350 Cerebral infarction due to unspecified occlusion or stenosis of unspecified cerebral artery: Secondary | ICD-10-CM | POA: Diagnosis not present

## 2013-10-23 DIAGNOSIS — I498 Other specified cardiac arrhythmias: Secondary | ICD-10-CM

## 2013-10-23 NOTE — Progress Notes (Signed)
HPI Patient is a 58 yo with a history of autonomic dysfunction, atrial tachycardia including afib, CVA, diverticulosis, HL.   I follow her along with Olin Pia in cardiology her in [place].  She is also followed by B Grubb in [place]. She was last in cardiology clinic in Feb 2015 She continues to be followed at [facility] [facility] and by B Benay Spice for melanoma [facility] is due to have CT scans in a couple weeks  She was taken off prednisone a few wks ago  On prednisone she had significant swelling  Took lasix Has continued to take because of LE edema.  The edema is worse by nighttime.  She wears support socks in AM when at home.  At night she elevates She denies CP Breathing is OK  No signif dizziness Is having hot flashes.   Still drinking ample fluids    Allergies  Allergen Reactions  . Doxycycline Hives  . Erythromycin Hives  . Penicillins Hives  . Preparation H [Pramox-Pe-Glycerin-Petrolatum] Hives    Cannot use  Cream or Suppositories   . Promethazine     Other reaction(s): Other (See Comments) Causes restless leg syndrome  . Sulfonamide Derivatives Hives  . Phenergan [Promethazine Hcl] Other (See Comments)    Causes restless leg syndrome    Current Outpatient Prescriptions  Medication Sig Dispense Refill  . acetaminophen (TYLENOL) 500 MG tablet Take 1,000 mg by mouth every 6 (six) hours as needed for pain.      Marland Kitchen albuterol (VENTOLIN HFA) 108 (90 BASE) MCG/ACT inhaler Inhale 2 puffs into the lungs as needed.      . clonazePAM (KLONOPIN) 0.5 MG tablet Take 1 tablet (0.5 mg total) by mouth 3 (three) times daily as needed for anxiety.  50 tablet  0  . cloNIDine (CATAPRES) 0.1 MG tablet Take 1 tablet (0.1 mg total) by mouth at bedtime.  90 tablet  1  . cyanocobalamin (,VITAMIN B-12,) 1000 MCG/ML injection INJECT 1 ML UNDER THE SKIN AS DIRECTED ONCE A MONTH  3 mL  0  . diphenhydrAMINE (BENADRYL) 25 MG tablet Take 12.5 mg by mouth every 6 (six) hours as needed. Seasonal allergies      .  esomeprazole (NEXIUM) 40 MG capsule Take 40 mg by mouth 2 (two) times daily.      . furosemide (LASIX) 40 MG tablet Take 40 mg by mouth daily.       . hydrocortisone (ANUSOL-HC) 25 MG suppository Place 25 mg rectally at bedtime as needed for hemorrhoids.      . hydrocortisone 2.5 % cream Apply 1 application topically 2 (two) times daily as needed (irritation).       . hydrocortisone-pramoxine (ANALPRAM-HC) 2.5-1 % rectal cream Place 1 application rectally at bedtime as needed for hemorrhoids.      . levETIRAcetam (KEPPRA) 500 MG tablet Take 500 mg by mouth 2 (two) times daily.      . mesalamine (CANASA) 1000 MG suppository Place 1,000 mg rectally at bedtime as needed (hemorrhoids).       . metFORMIN (GLUCOPHAGE) 500 MG tablet Take 500 mg by mouth daily after breakfast.      . metoprolol tartrate (LOPRESSOR) 25 MG tablet Take 25 mg by mouth 2 (two) times daily.       Marland Kitchen nystatin (MYCOSTATIN) 100000 UNIT/ML suspension Take 5 mLs (500,000 Units total) by mouth 4 (four) times daily as needed.  240 mL  1  . ondansetron (ZOFRAN-ODT) 4 MG disintegrating tablet Take 4 mg by mouth as  needed. 4mg  ODT q4 hours prn nausea/vomit      . polyethylene glycol powder (GLYCOLAX/MIRALAX) powder Stir 17 grams in 8oz of water, or juice, 1 or 2 times daily for constipation   1054 g  2  . potassium chloride 20 MEQ/15ML (10%) solution Take 79mls by mouth 3 times daily  900 mL  2  . rosuvastatin (CRESTOR) 5 MG tablet Take 5 mg by mouth daily.      . TGT ANTACID ANTI-GAS 200-200-20 MG/5ML suspension       . Vitamin D, Ergocalciferol, (DRISDOL) 50000 UNITS CAPS capsule Take 50,000 Units by mouth every 7 (seven) days.      . hyoscyamine (LEVSIN SL) 0.125 MG SL tablet Place 0.125 mg under the tongue 4 (four) times daily -  before meals and at bedtime.      . hyoscyamine (LEVSIN, ANASPAZ) 0.125 MG tablet Take 2 tablets (0.25 mg total) by mouth every 4 (four) hours as needed for cramping.  30 tablet  1   No current  facility-administered medications for this visit.    Past Medical History  Diagnosis Date  . 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 Hypoglycemia, unspecified   . Other nonspecific abnormal serum enzyme levels   . Cervicalgia   . Disturbance of skin sensation   . Other specified congenital anomalies of nervous system   . Swelling of limb   . Disorder of bone and cartilage, unspecified   . Atrial tachycardia   . CVA (cerebral infarction)     2012 with dizziness and vision change felt embolic from atrial tachy  . History of pacemaker   . POTS (postural orthostatic tachycardia syndrome)   . Diverticulosis   . Osteopenia   . DVT (deep venous thrombosis)   . Atrial fibrillation   . DM (diabetes mellitus)   . DVT (deep venous thrombosis)   . HLD (hyperlipidemia)   . HTN (hypertension)   . Pacemaker     autonomic dysfunction  . Complication of anesthesia     pt states wakes up with "shakes"  . PONV (postoperative nausea and vomiting)   . Stroke     left weaker   . History of radiation therapy 10/24/12    brain  . Skin cancer     Hx: of lung lesion  . Family history of anesthesia complication     PONV  . Asthma     Past Surgical History  Procedure Laterality Date  . Insert / replace / remove pacemaker  2012    1999, x 3  . Ablation saphenous vein w/ rfa      2002  . Tonsillectomy and adenoidectomy    . Carpal tunnel release      left  . Nose surgery  2010, 2012    for nose bleeds x 2  . Fatty tumor removed      from chest  . Melanoma excision      with removal of lymph nodes, left shoulder  . Deep axillary sentinel node biopsy / excision      due to extensive Melanoma-right arm  . Laparoscopy  09/06/2011    Procedure: LAPAROSCOPY OPERATIVE;  Surgeon: Claiborne Billings A. Pamala Hurry, MD;  Location: [facility];  Service: Gynecology;  Laterality: Left;  with Left Ovarian Cystectomy   . Craniotomy N/A 09/30/2012    suboccipital craniectomy  . Craniotomy Left  02/09/2013    Procedure: LEFT PARIETAL CRANIOTOMY with stealth;  Surgeon: [name], MD;  Location: [facility];  Service: Neurosurgery;  Laterality: Left;  LEFT Parietal Craniotomy for tumor with stealth    Family History  Problem Relation Age of Onset  . Heart disease Father   . Diabetes Maternal Grandmother   . Diabetes Paternal Grandmother   . Stroke Mother   . Colon polyps Mother   . Clotting disorder Maternal Grandmother     stroke  . Crohn's disease Maternal Grandmother   . Diabetes Father   . Kidney disease Father     History   Social History  . Marital Status: Married    Spouse Name: N/A    Number of Children: 0  . Years of Education: N/A   Occupational History  . PRESIDENT     self employed   Social History Main Topics  . Smoking status: Never Smoker   . Smokeless tobacco: Never Used  . Alcohol Use: No     Comment: glass of wine daily  . Drug Use: No  . Sexual Activity: Not on file   Other Topics Concern  . Not on file   Social History Narrative  . No narrative on file    Review of Systems:  All systems reviewed.  They are negative to the above problem except as previously stated.  Vital Signs: BP 110/86  Pulse 74  Ht 5\' 3"  (1.6 m)  Wt 161 lb (73.029 kg)  BMI 28.53 kg/m2  Physical Exam Patient is in NAD HEENT:  Normocephalic, atraumatic. EOMI, PERRLA.  Neck: JVP is normal.  No bruits.  Lungs: clear to auscultation. No rales no wheezes.  Heart: Regular rate and rhythm. Normal S1, S2. No S3.   No significant murmurs. PMI not displaced.  Abdomen:  Supple, nontender. Normal bowel sounds. No masses. No hepatomegaly.  Extremities:   Good distal pulses throughout.Tr lower extremity edema.  Musculoskeletal :moving all extremities.  Neuro:   alert and oriented x3.  CN II-XII grossly intact.   Assessment and Plan:  1.  Edema  Patient's volume status looks good this AM  I would keep on same regimen.   Note a quick look echo last winter (when she  had more swelling) showed normal LV and RV function. Taper lasix as tolerated.    1.  Autonomic dysdfunction.Doing fairly well    2.  Atrial arrhythmias.  No signif spells. Biotronic pacer  Did not get pacer interrogated.    3.  Neuro  CVA  No further spells  Off of anticoag with current Rx    4.  Melanoma Follow up at [facility]    5.  HL  On Crestor  Get lipids.    Will be available as needed

## 2013-10-25 DIAGNOSIS — C719 Malignant neoplasm of brain, unspecified: Secondary | ICD-10-CM | POA: Diagnosis not present

## 2013-10-25 DIAGNOSIS — Z452 Encounter for adjustment and management of vascular access device: Secondary | ICD-10-CM | POA: Diagnosis not present

## 2013-10-25 DIAGNOSIS — Z5181 Encounter for therapeutic drug level monitoring: Secondary | ICD-10-CM | POA: Diagnosis not present

## 2013-10-25 DIAGNOSIS — G909 Disorder of the autonomic nervous system, unspecified: Secondary | ICD-10-CM | POA: Diagnosis not present

## 2013-10-25 DIAGNOSIS — Z483 Aftercare following surgery for neoplasm: Secondary | ICD-10-CM | POA: Diagnosis not present

## 2013-10-25 DIAGNOSIS — C439 Malignant melanoma of skin, unspecified: Secondary | ICD-10-CM | POA: Diagnosis not present

## 2013-10-29 DIAGNOSIS — Z5181 Encounter for therapeutic drug level monitoring: Secondary | ICD-10-CM | POA: Diagnosis not present

## 2013-10-29 DIAGNOSIS — C438 Malignant melanoma of overlapping sites of skin: Secondary | ICD-10-CM | POA: Diagnosis not present

## 2013-10-29 DIAGNOSIS — G909 Disorder of the autonomic nervous system, unspecified: Secondary | ICD-10-CM | POA: Diagnosis not present

## 2013-10-29 DIAGNOSIS — C719 Malignant neoplasm of brain, unspecified: Secondary | ICD-10-CM | POA: Diagnosis not present

## 2013-10-29 DIAGNOSIS — Z452 Encounter for adjustment and management of vascular access device: Secondary | ICD-10-CM | POA: Diagnosis not present

## 2013-10-29 DIAGNOSIS — Z483 Aftercare following surgery for neoplasm: Secondary | ICD-10-CM | POA: Diagnosis not present

## 2013-11-01 DIAGNOSIS — Z452 Encounter for adjustment and management of vascular access device: Secondary | ICD-10-CM | POA: Diagnosis not present

## 2013-11-01 DIAGNOSIS — Z5181 Encounter for therapeutic drug level monitoring: Secondary | ICD-10-CM | POA: Diagnosis not present

## 2013-11-01 DIAGNOSIS — C438 Malignant melanoma of overlapping sites of skin: Secondary | ICD-10-CM | POA: Diagnosis not present

## 2013-11-01 DIAGNOSIS — C719 Malignant neoplasm of brain, unspecified: Secondary | ICD-10-CM | POA: Diagnosis not present

## 2013-11-01 DIAGNOSIS — G909 Disorder of the autonomic nervous system, unspecified: Secondary | ICD-10-CM | POA: Diagnosis not present

## 2013-11-01 DIAGNOSIS — Z483 Aftercare following surgery for neoplasm: Secondary | ICD-10-CM | POA: Diagnosis not present

## 2013-11-03 DIAGNOSIS — I669 Occlusion and stenosis of unspecified cerebral artery: Secondary | ICD-10-CM | POA: Diagnosis not present

## 2013-11-03 DIAGNOSIS — D496 Neoplasm of unspecified behavior of brain: Secondary | ICD-10-CM | POA: Diagnosis not present

## 2013-11-03 DIAGNOSIS — K5730 Diverticulosis of large intestine without perforation or abscess without bleeding: Secondary | ICD-10-CM | POA: Diagnosis not present

## 2013-11-03 DIAGNOSIS — Z9889 Other specified postprocedural states: Secondary | ICD-10-CM | POA: Diagnosis not present

## 2013-11-03 DIAGNOSIS — C439 Malignant melanoma of skin, unspecified: Secondary | ICD-10-CM | POA: Diagnosis not present

## 2013-11-03 DIAGNOSIS — K7689 Other specified diseases of liver: Secondary | ICD-10-CM | POA: Diagnosis not present

## 2013-11-03 DIAGNOSIS — R911 Solitary pulmonary nodule: Secondary | ICD-10-CM | POA: Diagnosis not present

## 2013-11-03 DIAGNOSIS — K449 Diaphragmatic hernia without obstruction or gangrene: Secondary | ICD-10-CM | POA: Diagnosis not present

## 2013-11-04 ENCOUNTER — Other Ambulatory Visit: Payer: Self-pay | Admitting: *Deleted

## 2013-11-04 ENCOUNTER — Other Ambulatory Visit (HOSPITAL_COMMUNITY): Payer: Self-pay

## 2013-11-04 ENCOUNTER — Ambulatory Visit (HOSPITAL_COMMUNITY): Payer: Medicare Other | Attending: Interventional Cardiology | Admitting: Cardiology

## 2013-11-04 DIAGNOSIS — M79609 Pain in unspecified limb: Secondary | ICD-10-CM | POA: Diagnosis not present

## 2013-11-04 DIAGNOSIS — Z86718 Personal history of other venous thrombosis and embolism: Secondary | ICD-10-CM | POA: Diagnosis not present

## 2013-11-04 DIAGNOSIS — E785 Hyperlipidemia, unspecified: Secondary | ICD-10-CM | POA: Diagnosis not present

## 2013-11-04 DIAGNOSIS — R609 Edema, unspecified: Secondary | ICD-10-CM

## 2013-11-04 DIAGNOSIS — M79605 Pain in left leg: Secondary | ICD-10-CM

## 2013-11-04 DIAGNOSIS — I10 Essential (primary) hypertension: Secondary | ICD-10-CM | POA: Insufficient documentation

## 2013-11-04 NOTE — Progress Notes (Signed)
LE Venous duplex performed; discussed with Dr. Harrington Challenger.

## 2013-11-08 DIAGNOSIS — G909 Disorder of the autonomic nervous system, unspecified: Secondary | ICD-10-CM | POA: Diagnosis not present

## 2013-11-08 DIAGNOSIS — C719 Malignant neoplasm of brain, unspecified: Secondary | ICD-10-CM | POA: Diagnosis not present

## 2013-11-08 DIAGNOSIS — C438 Malignant melanoma of overlapping sites of skin: Secondary | ICD-10-CM | POA: Diagnosis not present

## 2013-11-08 DIAGNOSIS — Z5181 Encounter for therapeutic drug level monitoring: Secondary | ICD-10-CM | POA: Diagnosis not present

## 2013-11-08 DIAGNOSIS — Z452 Encounter for adjustment and management of vascular access device: Secondary | ICD-10-CM | POA: Diagnosis not present

## 2013-11-08 DIAGNOSIS — Z483 Aftercare following surgery for neoplasm: Secondary | ICD-10-CM | POA: Diagnosis not present

## 2013-11-11 DIAGNOSIS — Z6827 Body mass index (BMI) 27.0-27.9, adult: Secondary | ICD-10-CM | POA: Diagnosis not present

## 2013-11-11 DIAGNOSIS — C7931 Secondary malignant neoplasm of brain: Secondary | ICD-10-CM | POA: Diagnosis not present

## 2013-11-12 ENCOUNTER — Telehealth: Payer: Self-pay | Admitting: Internal Medicine

## 2013-11-12 ENCOUNTER — Other Ambulatory Visit (HOSPITAL_BASED_OUTPATIENT_CLINIC_OR_DEPARTMENT_OTHER): Payer: Medicare Other

## 2013-11-12 ENCOUNTER — Telehealth: Payer: Self-pay | Admitting: Oncology

## 2013-11-12 ENCOUNTER — Ambulatory Visit (HOSPITAL_BASED_OUTPATIENT_CLINIC_OR_DEPARTMENT_OTHER): Payer: Medicare Other | Admitting: Oncology

## 2013-11-12 VITALS — BP 113/72 | HR 86 | Temp 98.1°F | Resp 18 | Ht 63.0 in | Wt 158.1 lb

## 2013-11-12 DIAGNOSIS — C439 Malignant melanoma of skin, unspecified: Secondary | ICD-10-CM

## 2013-11-12 DIAGNOSIS — C4360 Malignant melanoma of unspecified upper limb, including shoulder: Secondary | ICD-10-CM

## 2013-11-12 DIAGNOSIS — I10 Essential (primary) hypertension: Secondary | ICD-10-CM

## 2013-11-12 DIAGNOSIS — C799 Secondary malignant neoplasm of unspecified site: Secondary | ICD-10-CM

## 2013-11-12 LAB — CBC WITH DIFFERENTIAL/PLATELET
BASO%: 0.5 % (ref 0.0–2.0)
Basophils Absolute: 0 10*3/uL (ref 0.0–0.1)
EOS%: 2.2 % (ref 0.0–7.0)
Eosinophils Absolute: 0.1 10*3/uL (ref 0.0–0.5)
HCT: 39.4 % (ref 34.8–46.6)
HGB: 12.9 g/dL (ref 11.6–15.9)
LYMPH%: 35.1 % (ref 14.0–49.7)
MCH: 28.2 pg (ref 25.1–34.0)
MCHC: 32.7 g/dL (ref 31.5–36.0)
MCV: 86 fL (ref 79.5–101.0)
MONO#: 0.6 10*3/uL (ref 0.1–0.9)
MONO%: 10.2 % (ref 0.0–14.0)
NEUT#: 2.9 10*3/uL (ref 1.5–6.5)
NEUT%: 52 % (ref 38.4–76.8)
Platelets: 261 10*3/uL (ref 145–400)
RBC: 4.58 10*6/uL (ref 3.70–5.45)
RDW: 11.9 % (ref 11.2–14.5)
WBC: 5.5 10*3/uL (ref 3.9–10.3)
lymph#: 1.9 10*3/uL (ref 0.9–3.3)

## 2013-11-12 LAB — COMPREHENSIVE METABOLIC PANEL
ALT: 25 U/L (ref 0–35)
AST: 28 U/L (ref 0–37)
Albumin: 4.3 g/dL (ref 3.5–5.2)
Alkaline Phosphatase: 117 U/L (ref 39–117)
BUN: 17 mg/dL (ref 6–23)
CO2: 21 mEq/L (ref 19–32)
Calcium: 9.3 mg/dL (ref 8.4–10.5)
Chloride: 102 mEq/L (ref 96–112)
Creatinine, Ser: 0.87 mg/dL (ref 0.50–1.10)
Glucose, Bld: 82 mg/dL (ref 70–99)
Potassium: 3.9 mEq/L (ref 3.5–5.3)
Sodium: 138 mEq/L (ref 135–145)
Total Bilirubin: 0.3 mg/dL (ref 0.2–1.2)
Total Protein: 7.3 g/dL (ref 6.0–8.3)

## 2013-11-12 LAB — LIPASE: Lipase: 112 U/L — ABNORMAL HIGH (ref 0–75)

## 2013-11-12 NOTE — Telephone Encounter (Signed)
gv adn printed appt sched and avs for pt for OCT

## 2013-11-12 NOTE — Progress Notes (Signed)
[facility] OFFICE PROGRESS NOTE   Diagnosis: Melanoma  INTERVAL HISTORY:   She returns as scheduled. She feels well. No diarrhea or abdominal pain. She underwent a restaging CT evaluation at [facility] last week and there was no evidence for progressive metastatic disease. She saw Dr. Ellene Route and the Keppra is being tapered.  She reports generalized muscle weakness and plans to begin a physical therapy program. She has noted a palpable fullness at the left lower leg and Dopplers of the legs on 11/05/2013 were negative.  Objective:  Vital signs in last 24 hours:  Blood pressure 113/72, pulse 86, temperature 98.1 F (36.7 C), temperature source Oral, resp. rate 18, height 5' 3"  (1.6 m), weight 158 lb 1.6 oz (71.714 kg).    HEENT: No thrush Lymphatics: No cervical, supraclavicular, or axillary nodes Resp: Lungs clear bilaterally Cardio: Regular rate and rhythm GI: No hepatosplenomegaly, nontender, no mass Vascular: No leg edema, soft fullness at the left lateral anterior lower leg without a discrete mass   Lab Results:  Lab Results  Component Value Date   WBC 5.5 11/12/2013   HGB 12.9 11/12/2013   HCT 39.4 11/12/2013   MCV 86.0 11/12/2013   PLT 261 11/12/2013   NEUTROABS 2.9 11/12/2013     Imaging:  CT head with contrast at [facility] 11/03/2013-no abnormal parenchymal enhancement.  CTs of the chest, abdomen, and pelvis at [facility] on 11/03/2013-unchanged 3 mm nodule in the anterolateral right lower lobe, 9 mm hypodensity in the hepatic segment 4 is unchanged, 8 mm hypodensity in segment 6/7 is unchanged, no new focal liver lesions. Extensive diverticulosis of the sigmoid colon with mild diffuse sigmoid colonic wall thickening. Medications: I have reviewed the patient's current medications.  Assessment/Plan: 1.Metastatic melanoma  -Status post resection of a right arm melanoma, stage II A (T2b N0) in December 2009  -Metastatic melanoma, status post resection of a left  cerebellar metastasis 09/30/2012, BRAF mutation identified  -Status post SRS treatment of the left cerebellum and a left parietal metastasis 10/24/2012  -Staging CTs and PET scan without evidence of distant metastatic disease aside from indeterminate small lung nodules  -Restaging CTs 12/01/2012 consistent with enlargement of the left parietal mass and right lower lobe lung nodule  -Initiation of dabrafenib and tremetinib 12/17/2012. She reports extremely poor tolerance. Discontinued.  -Status post resection of a left parietal metastasis 02/09/2013.  -Restaging CT evaluation 03/03/2013. Stable 1.5 cm right lower lobe pulmonary nodule. Multiple peripheral enhancing lesions throughout the liver.  -Initiation of Ipilimumab at [facility] 03/10/2013.  -Cycle 2 Ipilumumab 04/17/2013  -Cycle 3 Ipilumumab 05/14/2013  -Cycle 4 Ipilumumab 06/04/2013  -Restaging CT at [facility] 06/25/2013 with multiple subcentimeter liver lesions, no new lesions, decreased right lower lobe nodule.  -Brain MRI 07/07/2013 with no evidence of progressive metastatic disease.  -Brain CT 10/09/2013 with no evidence of progressive metastatic disease  -Brain CT and CTs of the chest, abdomen, and pelvis at [facility] on 11/03/2013 with no evidence of progressive disease 2. Arrhythmia, pacemaker in place.  3. Hypertension  4. History of a CVA  5. History of deep vein thrombosis  6. History of proximal leg weakness-likely related to chronic steroid use.  7. Intermittent rash at the posterior thighs and left hand-likely related to systemic therapy.  8. Leg edema-? Related to liver disease,? Secondary to ipilumumab . Improved on furosemide.  9. Headache and mild nausea-coincided with discontinuation of Decadron, CT of the brain 03/25/2011 with no acute change.  10. Diarrhea secondary to Ipilumumab, resolved,  prednisone has been tapered off  11. History of Mild elevation of lipase-likely mild asymptomatic pancreatitis related to Ipilumumab      Disposition:  She appears stable. No clinical or CT evidence for progression of the metastatic melanoma. She will continue CT surveillance with Dr. Harriet Masson. She will return for an office and lab visit here in 5 weeks. We will administer an influenza vaccine when she returns in 5 weeks. Betsy Coder, MD  11/12/2013  11:04 AM

## 2013-11-12 NOTE — Telephone Encounter (Signed)
New message      Normal saline is on back order....need presc for something else.  They have 1/2 normal saline, D5 saline, D5LR saline in stock. Can she get a presc for one of these?

## 2013-11-13 ENCOUNTER — Telehealth: Payer: Self-pay | Admitting: *Deleted

## 2013-11-13 ENCOUNTER — Encounter: Payer: Self-pay | Admitting: Radiation Therapy

## 2013-11-13 NOTE — Progress Notes (Signed)
Saw Dr. Ellene Route last week. Since she is doing well and her most recent [facility]  scans are clear, he his tapering her off of Keppra. I let Dr. Lisbeth Renshaw know as well.   [name]

## 2013-11-13 NOTE — Telephone Encounter (Signed)
Spoke with pt, she has viewed labs on Mychart. Will not need labs faxed.

## 2013-11-13 NOTE — Telephone Encounter (Signed)
Ok to try 1/2 normal saline per Dr. Harrington Challenger. Triage spoke with home care to inform.

## 2013-11-13 NOTE — Telephone Encounter (Signed)
Message copied by Brien Few on Fri Nov 13, 2013  4:30 PM ------      Message from: Betsy Coder B      Created: Thu Nov 12, 2013  9:49 PM       She requested we fax her a copy of labs ------

## 2013-11-15 ENCOUNTER — Other Ambulatory Visit: Payer: Self-pay | Admitting: Internal Medicine

## 2013-11-15 DIAGNOSIS — C438 Malignant melanoma of overlapping sites of skin: Secondary | ICD-10-CM | POA: Diagnosis not present

## 2013-11-15 DIAGNOSIS — Z5181 Encounter for therapeutic drug level monitoring: Secondary | ICD-10-CM | POA: Diagnosis not present

## 2013-11-15 DIAGNOSIS — Z452 Encounter for adjustment and management of vascular access device: Secondary | ICD-10-CM | POA: Diagnosis not present

## 2013-11-15 DIAGNOSIS — C719 Malignant neoplasm of brain, unspecified: Secondary | ICD-10-CM | POA: Diagnosis not present

## 2013-11-15 DIAGNOSIS — G909 Disorder of the autonomic nervous system, unspecified: Secondary | ICD-10-CM | POA: Diagnosis not present

## 2013-11-15 DIAGNOSIS — Z483 Aftercare following surgery for neoplasm: Secondary | ICD-10-CM | POA: Diagnosis not present

## 2013-11-16 ENCOUNTER — Other Ambulatory Visit: Payer: Self-pay

## 2013-11-16 DIAGNOSIS — M6281 Muscle weakness (generalized): Secondary | ICD-10-CM | POA: Diagnosis not present

## 2013-11-16 DIAGNOSIS — M2550 Pain in unspecified joint: Secondary | ICD-10-CM | POA: Diagnosis not present

## 2013-11-16 DIAGNOSIS — M25473 Effusion, unspecified ankle: Secondary | ICD-10-CM | POA: Diagnosis not present

## 2013-11-16 DIAGNOSIS — M25476 Effusion, unspecified foot: Secondary | ICD-10-CM | POA: Diagnosis not present

## 2013-11-16 DIAGNOSIS — R262 Difficulty in walking, not elsewhere classified: Secondary | ICD-10-CM | POA: Diagnosis not present

## 2013-11-16 MED ORDER — CYANOCOBALAMIN 1000 MCG/ML IJ SOLN: INTRAMUSCULAR | Status: DC

## 2013-11-22 DIAGNOSIS — Z452 Encounter for adjustment and management of vascular access device: Secondary | ICD-10-CM | POA: Diagnosis not present

## 2013-11-22 DIAGNOSIS — G909 Disorder of the autonomic nervous system, unspecified: Secondary | ICD-10-CM | POA: Diagnosis not present

## 2013-11-22 DIAGNOSIS — Z5181 Encounter for therapeutic drug level monitoring: Secondary | ICD-10-CM | POA: Diagnosis not present

## 2013-11-22 DIAGNOSIS — C719 Malignant neoplasm of brain, unspecified: Secondary | ICD-10-CM | POA: Diagnosis not present

## 2013-11-22 DIAGNOSIS — C438 Malignant melanoma of overlapping sites of skin: Secondary | ICD-10-CM | POA: Diagnosis not present

## 2013-11-22 DIAGNOSIS — Z483 Aftercare following surgery for neoplasm: Secondary | ICD-10-CM | POA: Diagnosis not present

## 2013-11-24 DIAGNOSIS — M2550 Pain in unspecified joint: Secondary | ICD-10-CM | POA: Diagnosis not present

## 2013-11-24 DIAGNOSIS — M25473 Effusion, unspecified ankle: Secondary | ICD-10-CM | POA: Diagnosis not present

## 2013-11-24 DIAGNOSIS — M25476 Effusion, unspecified foot: Secondary | ICD-10-CM | POA: Diagnosis not present

## 2013-11-24 DIAGNOSIS — M6281 Muscle weakness (generalized): Secondary | ICD-10-CM | POA: Diagnosis not present

## 2013-11-24 DIAGNOSIS — R262 Difficulty in walking, not elsewhere classified: Secondary | ICD-10-CM | POA: Diagnosis not present

## 2013-11-25 IMAGING — CT CT HEAD W/O CM
2 series · 16 of 30 positions shown, 20 images · non-contrast
Comparison: MRI 10/22/2012

CLINICAL DATA: Melanoma. Prior radiation and stereotactic surgery
of a left upper lobe brain metastasis. New onset headache, nausea,
vomiting.

EXAM:
CT HEAD WITHOUT CONTRAST
TECHNIQUE: Contiguous axial images were obtained from the base of the skull
through the vertex without intravenous contrast.

[Series 2: head w/o · axial · non-contrast · 0.48mm/px · z∈[-107,+13]mm · 13 of 28 slices shown, 17 images]
[im 2/28  brain]
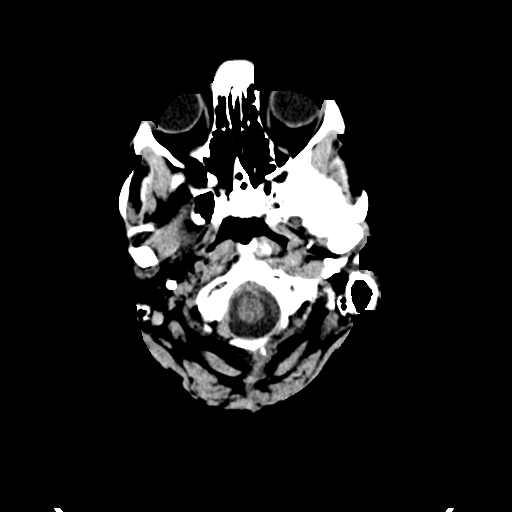
[im 2/28  bone]
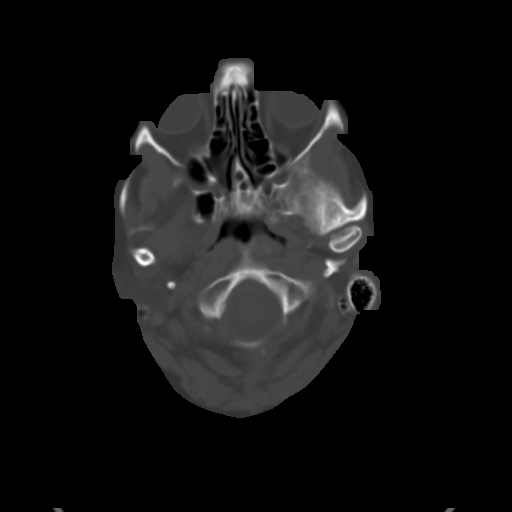
[im 4/28  brain]
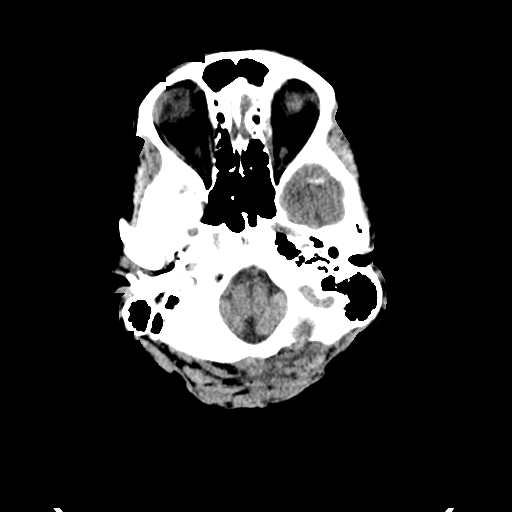
[im 6/28  brain]
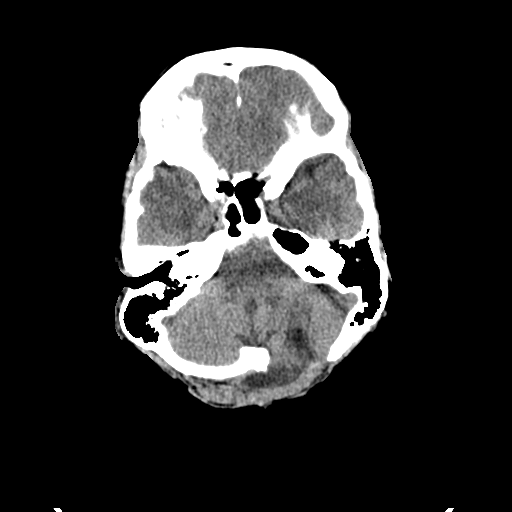
[im 8/28  brain]
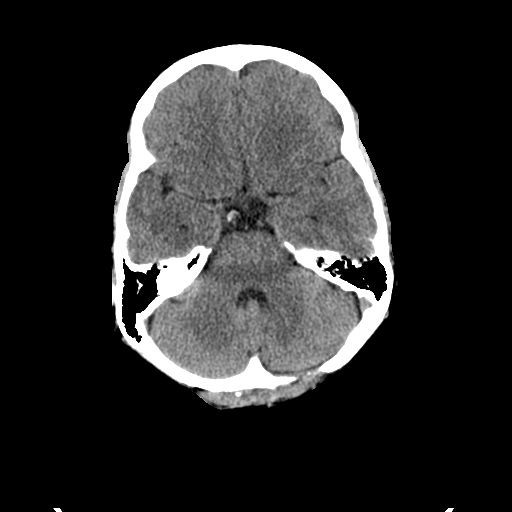
[im 10/28  brain]
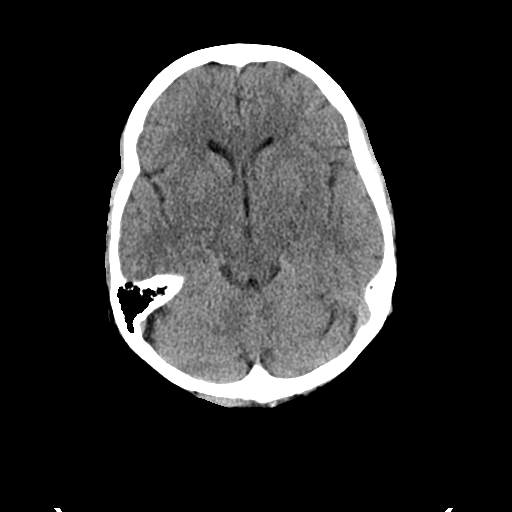
[im 10/28  bone]
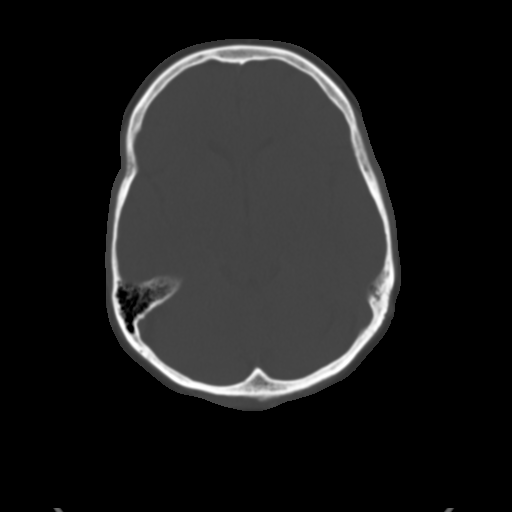
[im 12/28  brain]
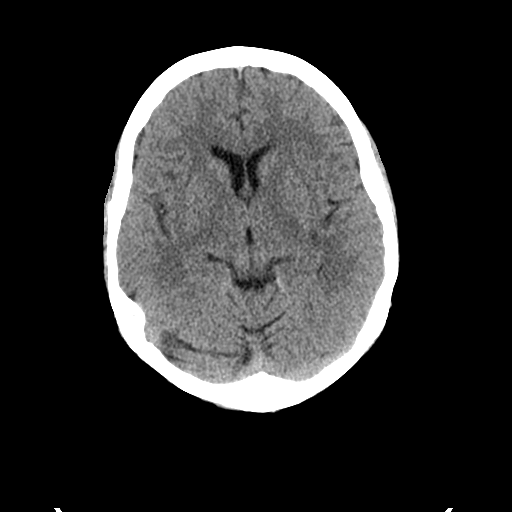
[im 14/28  brain]
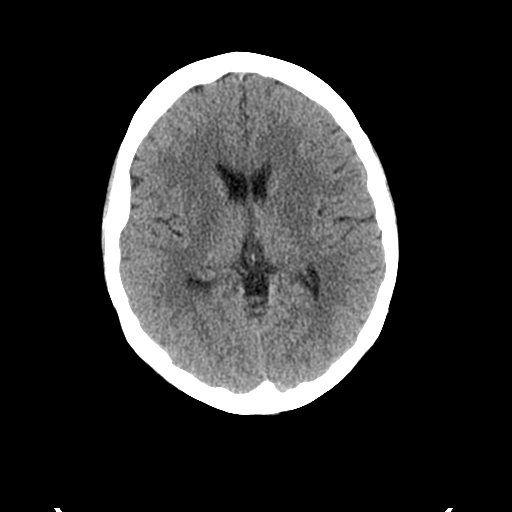
[im 16/28  brain]
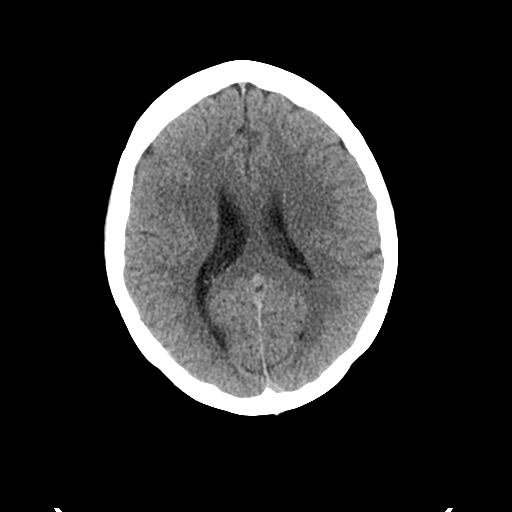
[im 18/28  brain]
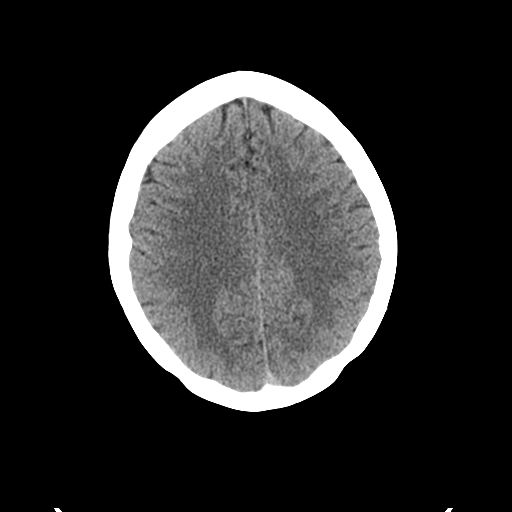
[im 18/28  bone]
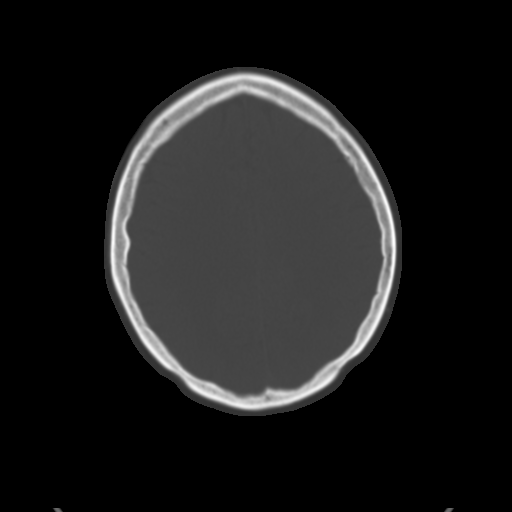
[im 20/28  brain]
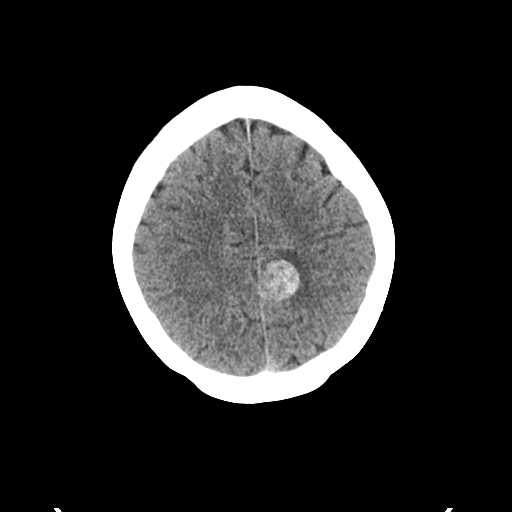
[im 22/28  brain]
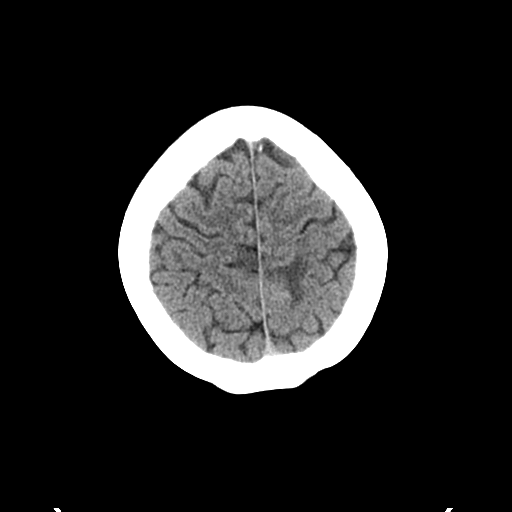
[im 24/28  brain]
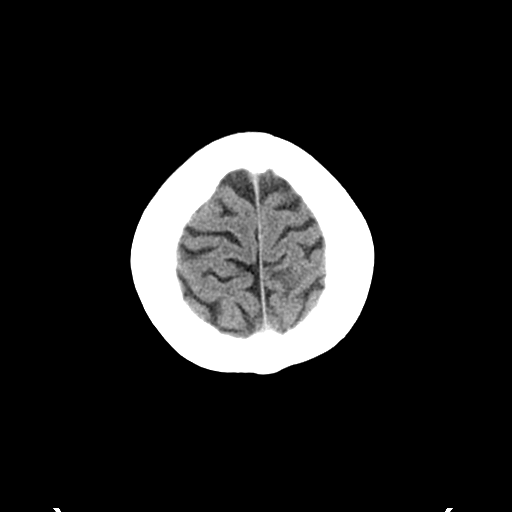
[im 26/28  brain]
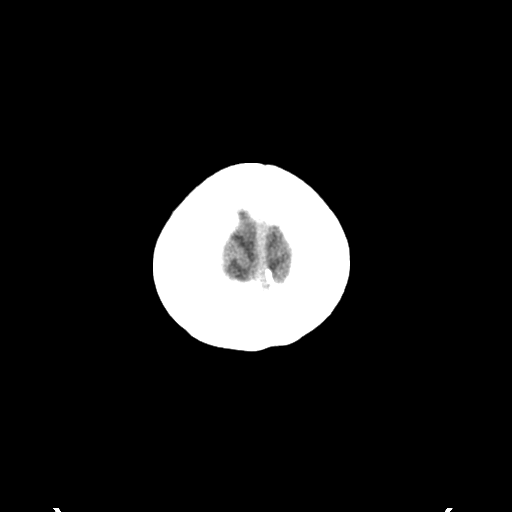
[im 26/28  bone]
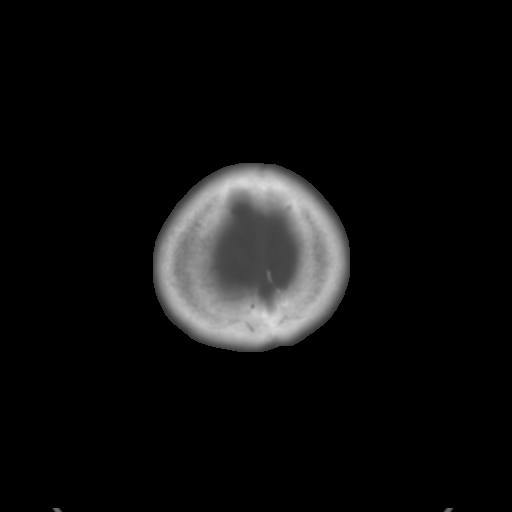

[Series 3: bone windows · axial · 0.48mm/px · z∈[-107,-67]mm · 3 of 28 slices shown]
[im 2/28  bone]
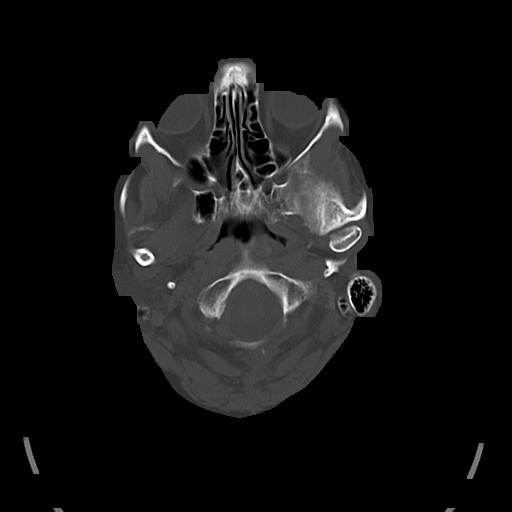
[im 6/28  bone]
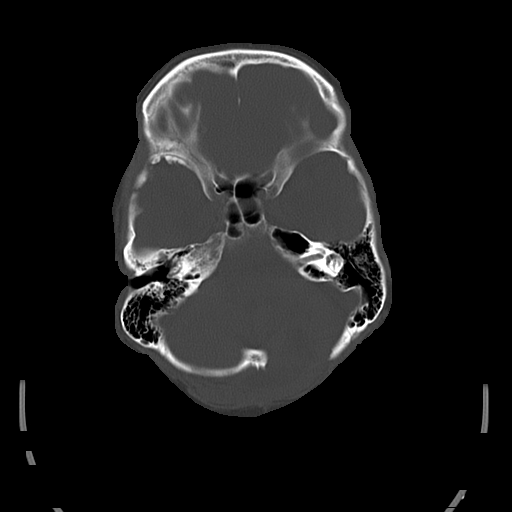
[im 10/28  bone]
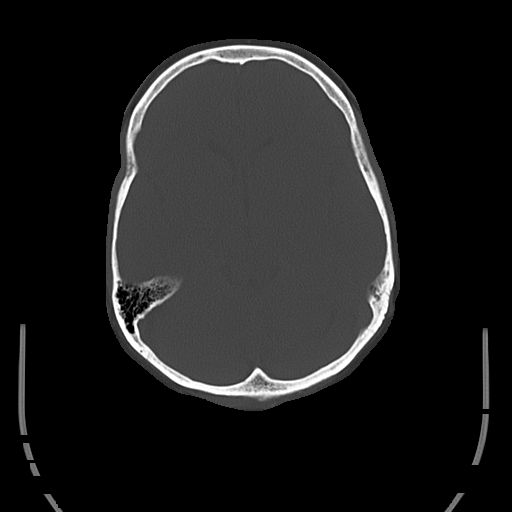

[16 of 30 positions shown; findings below may reference images not displayed]

FINDINGS: Postoperative changes from left occipital craniectomy. Area of
encephalomalacia in the left cerebellar hemisphere. There is a 2 cm
hyperdense area within the left medial parietal lobe in the area of
previously seen metastasis. This is similar size when compared to
recent MRI. The hyperdense appearance could be related to Arneld,
hemorrhage or both. Mild surrounding edema, also stable. No
hydrocephalus. No mass effect or midline shift.
IMPRESSION: Stable hyperdense left medial parietal metastasis.

Stable postoperative changes in the left cerebellar region.

## 2013-11-26 ENCOUNTER — Ambulatory Visit: Payer: Medicare Other

## 2013-11-26 DIAGNOSIS — M2550 Pain in unspecified joint: Secondary | ICD-10-CM | POA: Diagnosis not present

## 2013-11-26 DIAGNOSIS — M6281 Muscle weakness (generalized): Secondary | ICD-10-CM | POA: Diagnosis not present

## 2013-11-26 DIAGNOSIS — R262 Difficulty in walking, not elsewhere classified: Secondary | ICD-10-CM | POA: Diagnosis not present

## 2013-11-26 DIAGNOSIS — M25471 Effusion, right ankle: Secondary | ICD-10-CM | POA: Diagnosis not present

## 2013-11-27 ENCOUNTER — Encounter: Payer: Self-pay | Admitting: Internal Medicine

## 2013-11-29 DIAGNOSIS — C438 Malignant melanoma of overlapping sites of skin: Secondary | ICD-10-CM | POA: Diagnosis not present

## 2013-11-29 DIAGNOSIS — G909 Disorder of the autonomic nervous system, unspecified: Secondary | ICD-10-CM | POA: Diagnosis not present

## 2013-11-29 DIAGNOSIS — Z483 Aftercare following surgery for neoplasm: Secondary | ICD-10-CM | POA: Diagnosis not present

## 2013-11-29 DIAGNOSIS — Z5181 Encounter for therapeutic drug level monitoring: Secondary | ICD-10-CM | POA: Diagnosis not present

## 2013-11-29 DIAGNOSIS — C719 Malignant neoplasm of brain, unspecified: Secondary | ICD-10-CM | POA: Diagnosis not present

## 2013-11-29 DIAGNOSIS — Z452 Encounter for adjustment and management of vascular access device: Secondary | ICD-10-CM | POA: Diagnosis not present

## 2013-12-01 DIAGNOSIS — M25471 Effusion, right ankle: Secondary | ICD-10-CM | POA: Diagnosis not present

## 2013-12-01 DIAGNOSIS — M2550 Pain in unspecified joint: Secondary | ICD-10-CM | POA: Diagnosis not present

## 2013-12-01 DIAGNOSIS — R262 Difficulty in walking, not elsewhere classified: Secondary | ICD-10-CM | POA: Diagnosis not present

## 2013-12-01 DIAGNOSIS — M6281 Muscle weakness (generalized): Secondary | ICD-10-CM | POA: Diagnosis not present

## 2013-12-03 ENCOUNTER — Telehealth: Payer: Self-pay | Admitting: *Deleted

## 2013-12-03 ENCOUNTER — Other Ambulatory Visit: Payer: Self-pay | Admitting: *Deleted

## 2013-12-03 ENCOUNTER — Telehealth: Payer: Self-pay | Admitting: Oncology

## 2013-12-03 DIAGNOSIS — R262 Difficulty in walking, not elsewhere classified: Secondary | ICD-10-CM | POA: Diagnosis not present

## 2013-12-03 DIAGNOSIS — M6281 Muscle weakness (generalized): Secondary | ICD-10-CM | POA: Diagnosis not present

## 2013-12-03 DIAGNOSIS — M25471 Effusion, right ankle: Secondary | ICD-10-CM | POA: Diagnosis not present

## 2013-12-03 DIAGNOSIS — M2550 Pain in unspecified joint: Secondary | ICD-10-CM | POA: Diagnosis not present

## 2013-12-03 DIAGNOSIS — C439 Malignant melanoma of skin, unspecified: Secondary | ICD-10-CM

## 2013-12-03 DIAGNOSIS — C799 Secondary malignant neoplasm of unspecified site: Secondary | ICD-10-CM

## 2013-12-03 NOTE — Telephone Encounter (Signed)
, °

## 2013-12-03 NOTE — Telephone Encounter (Signed)
Attempted to reach patient regarding her requests to have a lab appt tomorrow, no answer to either phone listed.

## 2013-12-03 NOTE — Progress Notes (Signed)
Spoke with collaborative nurse who has called patient with no answer.  Verbal order received and read back from Dr. Benay Spice for labs tomorrow as requested by patient.  [name] says she will wait for results and e-mail or fax to Dr. Chauncey Mann at [facility].  Currently experiencing "discomfort/pain to upper right side near liver".  Asking for "lab test tomorrow due to Lipase being elevated.  Pain started a few days ago.  It came and went depending on how much I ate.  Now pain is constant".  Admits to "diarrhea a few days ago, but it went away".

## 2013-12-04 ENCOUNTER — Other Ambulatory Visit (HOSPITAL_BASED_OUTPATIENT_CLINIC_OR_DEPARTMENT_OTHER): Payer: Medicare Other

## 2013-12-04 ENCOUNTER — Encounter: Payer: Self-pay | Admitting: *Deleted

## 2013-12-04 DIAGNOSIS — C439 Malignant melanoma of skin, unspecified: Secondary | ICD-10-CM | POA: Diagnosis not present

## 2013-12-04 DIAGNOSIS — C799 Secondary malignant neoplasm of unspecified site: Secondary | ICD-10-CM | POA: Diagnosis not present

## 2013-12-04 DIAGNOSIS — C4360 Malignant melanoma of unspecified upper limb, including shoulder: Secondary | ICD-10-CM

## 2013-12-04 LAB — CBC WITH DIFFERENTIAL/PLATELET
BASO%: 1.4 % (ref 0.0–2.0)
Basophils Absolute: 0.1 10*3/uL (ref 0.0–0.1)
EOS%: 1.6 % (ref 0.0–7.0)
Eosinophils Absolute: 0.1 10*3/uL (ref 0.0–0.5)
HCT: 38.1 % (ref 34.8–46.6)
HGB: 12.5 g/dL (ref 11.6–15.9)
LYMPH%: 29.1 % (ref 14.0–49.7)
MCH: 27 pg (ref 25.1–34.0)
MCHC: 32.7 g/dL (ref 31.5–36.0)
MCV: 82.7 fL (ref 79.5–101.0)
MONO#: 0.5 10*3/uL (ref 0.1–0.9)
MONO%: 11.1 % (ref 0.0–14.0)
NEUT#: 2.8 10*3/uL (ref 1.5–6.5)
NEUT%: 56.8 % (ref 38.4–76.8)
Platelets: 261 10*3/uL (ref 145–400)
RBC: 4.61 10*6/uL (ref 3.70–5.45)
RDW: 12.5 % (ref 11.2–14.5)
WBC: 4.9 10*3/uL (ref 3.9–10.3)
lymph#: 1.4 10*3/uL (ref 0.9–3.3)

## 2013-12-04 LAB — COMPREHENSIVE METABOLIC PANEL (CC13)
ALT: 19 U/L (ref 0–55)
AST: 23 U/L (ref 5–34)
Albumin: 3.9 g/dL (ref 3.5–5.0)
Alkaline Phosphatase: 158 U/L — ABNORMAL HIGH (ref 40–150)
Anion Gap: 8 mEq/L (ref 3–11)
BUN: 14.8 mg/dL (ref 7.0–26.0)
CO2: 28 mEq/L (ref 22–29)
Calcium: 9.6 mg/dL (ref 8.4–10.4)
Chloride: 106 mEq/L (ref 98–109)
Creatinine: 0.9 mg/dL (ref 0.6–1.1)
Glucose: 87 mg/dl (ref 70–140)
Potassium: 4.1 mEq/L (ref 3.5–5.1)
Sodium: 143 mEq/L (ref 136–145)
Total Bilirubin: 0.25 mg/dL (ref 0.20–1.20)
Total Protein: 7.2 g/dL (ref 6.4–8.3)

## 2013-12-04 LAB — LACTATE DEHYDROGENASE (CC13): LDH: 261 U/L — ABNORMAL HIGH (ref 125–245)

## 2013-12-04 LAB — LIPASE: Lipase: 69 U/L — ABNORMAL HIGH (ref 11–59)

## 2013-12-06 DIAGNOSIS — C719 Malignant neoplasm of brain, unspecified: Secondary | ICD-10-CM | POA: Diagnosis not present

## 2013-12-06 DIAGNOSIS — C438 Malignant melanoma of overlapping sites of skin: Secondary | ICD-10-CM | POA: Diagnosis not present

## 2013-12-06 DIAGNOSIS — Z483 Aftercare following surgery for neoplasm: Secondary | ICD-10-CM | POA: Diagnosis not present

## 2013-12-06 DIAGNOSIS — Z452 Encounter for adjustment and management of vascular access device: Secondary | ICD-10-CM | POA: Diagnosis not present

## 2013-12-06 DIAGNOSIS — G909 Disorder of the autonomic nervous system, unspecified: Secondary | ICD-10-CM | POA: Diagnosis not present

## 2013-12-06 DIAGNOSIS — Z5181 Encounter for therapeutic drug level monitoring: Secondary | ICD-10-CM | POA: Diagnosis not present

## 2013-12-07 DIAGNOSIS — M2550 Pain in unspecified joint: Secondary | ICD-10-CM | POA: Diagnosis not present

## 2013-12-07 DIAGNOSIS — R262 Difficulty in walking, not elsewhere classified: Secondary | ICD-10-CM | POA: Diagnosis not present

## 2013-12-07 DIAGNOSIS — M25471 Effusion, right ankle: Secondary | ICD-10-CM | POA: Diagnosis not present

## 2013-12-07 DIAGNOSIS — M6281 Muscle weakness (generalized): Secondary | ICD-10-CM | POA: Diagnosis not present

## 2013-12-17 ENCOUNTER — Ambulatory Visit (HOSPITAL_BASED_OUTPATIENT_CLINIC_OR_DEPARTMENT_OTHER): Payer: BC Managed Care – PPO | Admitting: Oncology

## 2013-12-17 ENCOUNTER — Other Ambulatory Visit: Payer: Self-pay | Admitting: *Deleted

## 2013-12-17 ENCOUNTER — Other Ambulatory Visit (HOSPITAL_BASED_OUTPATIENT_CLINIC_OR_DEPARTMENT_OTHER): Payer: BC Managed Care – PPO

## 2013-12-17 ENCOUNTER — Telehealth: Payer: Self-pay | Admitting: Oncology

## 2013-12-17 VITALS — BP 118/84 | HR 84 | Temp 97.6°F | Resp 18 | Ht 63.0 in | Wt 157.6 lb

## 2013-12-17 DIAGNOSIS — C4360 Malignant melanoma of unspecified upper limb, including shoulder: Secondary | ICD-10-CM

## 2013-12-17 DIAGNOSIS — C799 Secondary malignant neoplasm of unspecified site: Secondary | ICD-10-CM

## 2013-12-17 DIAGNOSIS — I10 Essential (primary) hypertension: Secondary | ICD-10-CM | POA: Diagnosis not present

## 2013-12-17 DIAGNOSIS — C439 Malignant melanoma of skin, unspecified: Secondary | ICD-10-CM

## 2013-12-17 DIAGNOSIS — R109 Unspecified abdominal pain: Secondary | ICD-10-CM | POA: Diagnosis not present

## 2013-12-17 LAB — CBC WITH DIFFERENTIAL/PLATELET
BASO%: 1.2 % (ref 0.0–2.0)
Basophils Absolute: 0.1 10*3/uL (ref 0.0–0.1)
EOS%: 1.7 % (ref 0.0–7.0)
Eosinophils Absolute: 0.1 10*3/uL (ref 0.0–0.5)
HCT: 40.7 % (ref 34.8–46.6)
HGB: 13.1 g/dL (ref 11.6–15.9)
LYMPH%: 29.1 % (ref 14.0–49.7)
MCH: 26.3 pg (ref 25.1–34.0)
MCHC: 32.2 g/dL (ref 31.5–36.0)
MCV: 81.7 fL (ref 79.5–101.0)
MONO#: 0.4 10*3/uL (ref 0.1–0.9)
MONO%: 8.4 % (ref 0.0–14.0)
NEUT#: 3.1 10*3/uL (ref 1.5–6.5)
NEUT%: 59.6 % (ref 38.4–76.8)
Platelets: 273 10*3/uL (ref 145–400)
RBC: 4.97 10*6/uL (ref 3.70–5.45)
RDW: 12.6 % (ref 11.2–14.5)
WBC: 5.3 10*3/uL (ref 3.9–10.3)
lymph#: 1.5 10*3/uL (ref 0.9–3.3)

## 2013-12-17 LAB — COMPREHENSIVE METABOLIC PANEL (CC13)
ALT: 23 U/L (ref 0–55)
AST: 23 U/L (ref 5–34)
Albumin: 4.2 g/dL (ref 3.5–5.0)
Alkaline Phosphatase: 165 U/L — ABNORMAL HIGH (ref 40–150)
Anion Gap: 10 mEq/L (ref 3–11)
BUN: 12.8 mg/dL (ref 7.0–26.0)
CO2: 26 mEq/L (ref 22–29)
Calcium: 10 mg/dL (ref 8.4–10.4)
Chloride: 107 mEq/L (ref 98–109)
Creatinine: 0.9 mg/dL (ref 0.6–1.1)
Glucose: 94 mg/dl (ref 70–140)
Potassium: 3.9 mEq/L (ref 3.5–5.1)
Sodium: 143 mEq/L (ref 136–145)
Total Bilirubin: 0.31 mg/dL (ref 0.20–1.20)
Total Protein: 7.4 g/dL (ref 6.4–8.3)

## 2013-12-17 LAB — LACTATE DEHYDROGENASE (CC13): LDH: 258 U/L — ABNORMAL HIGH (ref 125–245)

## 2013-12-17 LAB — TSH CHCC: TSH: 1.146 m(IU)/L (ref 0.308–3.960)

## 2013-12-17 NOTE — Telephone Encounter (Signed)
Gave avs & cal for Nov & Dec. Pt wanted to see GBS.

## 2013-12-17 NOTE — Telephone Encounter (Signed)
error 

## 2013-12-17 NOTE — Progress Notes (Signed)
[name] OFFICE PROGRESS NOTE   Diagnosis: Melanoma  INTERVAL HISTORY:   Returns as scheduled. She reports developing dizziness and confusion when she decrease the Keppra dose. She is now back on 500 mg twice daily and the symptoms resolved. No seizures. She participated in physical therapy and became more fatigue. She continues to have fatigue. [name] reports 2-3 bowel movements per day. She has intermittent discomfort in the right upper abdomen, relieved with Tylenol. No fever. Intermittent night sweats. The leg swelling has improved.  Objective:  Vital signs in last 24 hours:  Blood pressure 118/84, pulse 84, temperature 97.6 F (36.4 C), temperature source Oral, resp. rate 18, height 5' 3"  (1.6 m), weight 157 lb 9.6 oz (71.487 kg).    HEENT: Neck without mass Lymphatics: No cervical or supraclavicular nodes Resp: Lungs clear bilaterally Cardio: Regular rate and rhythm GI: No hepatomegaly, nontender, no mass Vascular: No leg edema Neuro: The motor exam appears intact in the upper and lower extremities. Alert and oriented.  Skin: Right upper arm scar without evidence of recurrent tumor.    Lab Results:  Lab Results  Component Value Date   WBC 5.3 12/17/2013   HGB 13.1 12/17/2013   HCT 40.7 12/17/2013   MCV 81.7 12/17/2013   PLT 273 12/17/2013   NEUTROABS 3.1 12/17/2013   Creatinine 0.9, alkaline phosphatase 165, albumin 4.2, AST 23, ALT 23, bilirubin 0.31, LDH 258   Medications: I have reviewed the patient's current medications.  Assessment/Plan: 1.Metastatic melanoma  -Status post resection of a right arm melanoma, stage II A (T2b N0) in December 2009  -Metastatic melanoma, status post resection of a left cerebellar metastasis 09/30/2012, BRAF mutation identified  -Status post SRS treatment of the left cerebellum and a left parietal metastasis 10/24/2012  -Staging CTs and PET scan without evidence of distant metastatic disease aside from  indeterminate small lung nodules  -Restaging CTs 12/01/2012 consistent with enlargement of the left parietal mass and right lower lobe lung nodule  -Initiation of dabrafenib and tremetinib 12/17/2012. She reports extremely poor tolerance. Discontinued.  -Status post resection of a left parietal metastasis 02/09/2013.  -Restaging CT evaluation 03/03/2013. Stable 1.5 cm right lower lobe pulmonary nodule. Multiple peripheral enhancing lesions throughout the liver.  -Initiation of Ipilimumab at [facility] 03/10/2013.  -Cycle 2 Ipilumumab 04/17/2013  -Cycle 3 Ipilumumab 05/14/2013  -Cycle 4 Ipilumumab 06/04/2013  -Restaging CT at [facility] 06/25/2013 with multiple subcentimeter liver lesions, no new lesions, decreased right lower lobe nodule.  -Brain MRI 07/07/2013 with no evidence of progressive metastatic disease.  -Brain CT 10/09/2013 with no evidence of progressive metastatic disease  -Brain CT and CTs of the chest, abdomen, and pelvis at [facility] on 11/03/2013 with no evidence of progressive disease  2. Arrhythmia, pacemaker in place.  3. Hypertension  4. History of a CVA  5. History of deep vein thrombosis  6. History of proximal leg weakness-likely related to chronic steroid use.  7. Intermittent rash at the posterior thighs and left hand-likely related to systemic therapy.  8. Leg edema-? Related to liver disease,? Secondary to ipilumumab . Improved on furosemide.  9. Headache and mild nausea-coincided with discontinuation of Decadron, CT of the brain 03/25/2011 with no acute change.  10. Diarrhea secondary to Ipilumumab, resolved, prednisone has been tapered off  11. History of Mild elevation of lipase-likely mild asymptomatic pancreatitis related to Ipilumumab    Disposition:  There is no clinical evidence for progression of the melanoma. I suspect the malaise is secondary  to deconditioning and chronic steroid use. We will check the TSH and a cortisol level today. She will try backing down on the  MiraLAX.  She will contact us for increased right abdominal pain. She is scheduled for an appointment and restaging CT evaluation at [facility] in December. She will return for a lab visit here 01/14/2014 and an office visit 02/04/2014.  [name] will contact us in the interim for new symptoms. I recommend an influenza vaccine. She will think about this and let us know.   Betsy Coder, MD  12/17/2013  12:28 PM

## 2013-12-18 LAB — LIPASE: Lipase: 74 U/L (ref 0–75)

## 2013-12-18 LAB — CORTISOL: Cortisol, Plasma: 10.4 ug/dL

## 2013-12-21 DIAGNOSIS — M6281 Muscle weakness (generalized): Secondary | ICD-10-CM | POA: Diagnosis not present

## 2013-12-21 DIAGNOSIS — R262 Difficulty in walking, not elsewhere classified: Secondary | ICD-10-CM | POA: Diagnosis not present

## 2013-12-21 DIAGNOSIS — M25471 Effusion, right ankle: Secondary | ICD-10-CM | POA: Diagnosis not present

## 2013-12-21 DIAGNOSIS — M2550 Pain in unspecified joint: Secondary | ICD-10-CM | POA: Diagnosis not present

## 2013-12-23 ENCOUNTER — Ambulatory Visit (INDEPENDENT_AMBULATORY_CARE_PROVIDER_SITE_OTHER): Payer: Medicare Other | Admitting: *Deleted

## 2013-12-23 ENCOUNTER — Encounter: Payer: Self-pay | Admitting: Internal Medicine

## 2013-12-23 DIAGNOSIS — G9001 Carotid sinus syncope: Secondary | ICD-10-CM

## 2013-12-23 LAB — MDC_IDC_ENUM_SESS_TYPE_INCLINIC
Battery Remaining Longevity: 45 mo
Brady Statistic RA Percent Paced: 68 %
Brady Statistic RV Percent Paced: 0 %
Implantable Pulse Generator Model: 359524
Implantable Pulse Generator Serial Number: 66195584
Lead Channel Impedance Value: 370 Ohm
Lead Channel Impedance Value: 468 Ohm
Lead Channel Pacing Threshold Amplitude: 0.9 V
Lead Channel Pacing Threshold Amplitude: 1.7 V
Lead Channel Pacing Threshold Pulse Width: 0.4 ms
Lead Channel Pacing Threshold Pulse Width: 1 ms
Lead Channel Sensing Intrinsic Amplitude: 5.4 mV
Lead Channel Sensing Intrinsic Amplitude: 7.3 mV
Lead Channel Setting Pacing Amplitude: 2 V
Lead Channel Setting Pacing Amplitude: 4 V
Lead Channel Setting Pacing Pulse Width: 0.4 ms

## 2013-12-23 NOTE — Progress Notes (Signed)
Pacemaker check in clinic. Normal device function. Thresholds, sensing, impedances consistent with previous measurements. Device programmed to maximize longevity. 1 mode switch, SVT 5 beats.  No high ventricular rates noted. Device programmed at appropriate safety margins. Histogram distribution appropriate for patient activity level. Device programmed to optimize intrinsic conduction. Estimated longevity 4.9 years.  Patient education completed.  ROV in January with Dr. Caryl Comes.

## 2013-12-27 DIAGNOSIS — G909 Disorder of the autonomic nervous system, unspecified: Secondary | ICD-10-CM | POA: Diagnosis not present

## 2013-12-27 DIAGNOSIS — Z5181 Encounter for therapeutic drug level monitoring: Secondary | ICD-10-CM | POA: Diagnosis not present

## 2013-12-27 DIAGNOSIS — C719 Malignant neoplasm of brain, unspecified: Secondary | ICD-10-CM | POA: Diagnosis not present

## 2013-12-27 DIAGNOSIS — Z452 Encounter for adjustment and management of vascular access device: Secondary | ICD-10-CM | POA: Diagnosis not present

## 2013-12-27 DIAGNOSIS — Z483 Aftercare following surgery for neoplasm: Secondary | ICD-10-CM | POA: Diagnosis not present

## 2013-12-27 DIAGNOSIS — C438 Malignant melanoma of overlapping sites of skin: Secondary | ICD-10-CM | POA: Diagnosis not present

## 2013-12-28 DIAGNOSIS — C438 Malignant melanoma of overlapping sites of skin: Secondary | ICD-10-CM | POA: Diagnosis not present

## 2013-12-28 DIAGNOSIS — G909 Disorder of the autonomic nervous system, unspecified: Secondary | ICD-10-CM | POA: Diagnosis not present

## 2013-12-28 DIAGNOSIS — Z452 Encounter for adjustment and management of vascular access device: Secondary | ICD-10-CM | POA: Diagnosis not present

## 2013-12-28 DIAGNOSIS — Z5181 Encounter for therapeutic drug level monitoring: Secondary | ICD-10-CM | POA: Diagnosis not present

## 2013-12-28 DIAGNOSIS — Z483 Aftercare following surgery for neoplasm: Secondary | ICD-10-CM | POA: Diagnosis not present

## 2013-12-28 DIAGNOSIS — C719 Malignant neoplasm of brain, unspecified: Secondary | ICD-10-CM | POA: Diagnosis not present

## 2013-12-31 ENCOUNTER — Ambulatory Visit: Payer: BC Managed Care – PPO | Attending: Internal Medicine

## 2014-01-03 DIAGNOSIS — G909 Disorder of the autonomic nervous system, unspecified: Secondary | ICD-10-CM | POA: Diagnosis not present

## 2014-01-03 DIAGNOSIS — C438 Malignant melanoma of overlapping sites of skin: Secondary | ICD-10-CM | POA: Diagnosis not present

## 2014-01-03 DIAGNOSIS — C719 Malignant neoplasm of brain, unspecified: Secondary | ICD-10-CM | POA: Diagnosis not present

## 2014-01-03 DIAGNOSIS — Z483 Aftercare following surgery for neoplasm: Secondary | ICD-10-CM | POA: Diagnosis not present

## 2014-01-03 DIAGNOSIS — Z452 Encounter for adjustment and management of vascular access device: Secondary | ICD-10-CM | POA: Diagnosis not present

## 2014-01-03 DIAGNOSIS — Z5181 Encounter for therapeutic drug level monitoring: Secondary | ICD-10-CM | POA: Diagnosis not present

## 2014-01-10 DIAGNOSIS — Z483 Aftercare following surgery for neoplasm: Secondary | ICD-10-CM | POA: Diagnosis not present

## 2014-01-10 DIAGNOSIS — G909 Disorder of the autonomic nervous system, unspecified: Secondary | ICD-10-CM | POA: Diagnosis not present

## 2014-01-10 DIAGNOSIS — C719 Malignant neoplasm of brain, unspecified: Secondary | ICD-10-CM | POA: Diagnosis not present

## 2014-01-10 DIAGNOSIS — Z5181 Encounter for therapeutic drug level monitoring: Secondary | ICD-10-CM | POA: Diagnosis not present

## 2014-01-10 DIAGNOSIS — Z452 Encounter for adjustment and management of vascular access device: Secondary | ICD-10-CM | POA: Diagnosis not present

## 2014-01-10 DIAGNOSIS — C438 Malignant melanoma of overlapping sites of skin: Secondary | ICD-10-CM | POA: Diagnosis not present

## 2014-01-13 DIAGNOSIS — C7931 Secondary malignant neoplasm of brain: Secondary | ICD-10-CM | POA: Diagnosis not present

## 2014-01-13 DIAGNOSIS — G9389 Other specified disorders of brain: Secondary | ICD-10-CM | POA: Diagnosis not present

## 2014-01-13 DIAGNOSIS — Z6827 Body mass index (BMI) 27.0-27.9, adult: Secondary | ICD-10-CM | POA: Diagnosis not present

## 2014-01-14 ENCOUNTER — Other Ambulatory Visit (HOSPITAL_BASED_OUTPATIENT_CLINIC_OR_DEPARTMENT_OTHER): Payer: Medicare Other

## 2014-01-14 DIAGNOSIS — C4360 Malignant melanoma of unspecified upper limb, including shoulder: Secondary | ICD-10-CM | POA: Diagnosis not present

## 2014-01-14 DIAGNOSIS — C799 Secondary malignant neoplasm of unspecified site: Secondary | ICD-10-CM

## 2014-01-14 DIAGNOSIS — C439 Malignant melanoma of skin, unspecified: Secondary | ICD-10-CM | POA: Diagnosis not present

## 2014-01-14 LAB — CBC WITH DIFFERENTIAL/PLATELET
BASO%: 0.5 % (ref 0.0–2.0)
Basophils Absolute: 0 10*3/uL (ref 0.0–0.1)
EOS%: 1.7 % (ref 0.0–7.0)
Eosinophils Absolute: 0.1 10*3/uL (ref 0.0–0.5)
HCT: 39 % (ref 34.8–46.6)
HGB: 12.5 g/dL (ref 11.6–15.9)
LYMPH%: 36.5 % (ref 14.0–49.7)
MCH: 25.9 pg (ref 25.1–34.0)
MCHC: 32.1 g/dL (ref 31.5–36.0)
MCV: 80.7 fL (ref 79.5–101.0)
MONO#: 0.6 10*3/uL (ref 0.1–0.9)
MONO%: 10.9 % (ref 0.0–14.0)
NEUT#: 2.9 10*3/uL (ref 1.5–6.5)
NEUT%: 50.4 % (ref 38.4–76.8)
Platelets: 259 10*3/uL (ref 145–400)
RBC: 4.83 10*6/uL (ref 3.70–5.45)
RDW: 12.8 % (ref 11.2–14.5)
WBC: 5.8 10*3/uL (ref 3.9–10.3)
lymph#: 2.1 10*3/uL (ref 0.9–3.3)

## 2014-01-14 LAB — COMPREHENSIVE METABOLIC PANEL (CC13)
ALT: 18 U/L (ref 0–55)
AST: 20 U/L (ref 5–34)
Albumin: 4.3 g/dL (ref 3.5–5.0)
Alkaline Phosphatase: 191 U/L — ABNORMAL HIGH (ref 40–150)
Anion Gap: 9 mEq/L (ref 3–11)
BUN: 15.5 mg/dL (ref 7.0–26.0)
CO2: 29 mEq/L (ref 22–29)
Calcium: 9.7 mg/dL (ref 8.4–10.4)
Chloride: 103 mEq/L (ref 98–109)
Creatinine: 0.9 mg/dL (ref 0.6–1.1)
Glucose: 94 mg/dl (ref 70–140)
Potassium: 4 mEq/L (ref 3.5–5.1)
Sodium: 141 mEq/L (ref 136–145)
Total Bilirubin: 0.34 mg/dL (ref 0.20–1.20)
Total Protein: 7.5 g/dL (ref 6.4–8.3)

## 2014-01-14 LAB — LIPASE: Lipase: 77 U/L — ABNORMAL HIGH (ref 11–59)

## 2014-01-14 LAB — LACTATE DEHYDROGENASE (CC13): LDH: 260 U/L — ABNORMAL HIGH (ref 125–245)

## 2014-01-17 DIAGNOSIS — Z5181 Encounter for therapeutic drug level monitoring: Secondary | ICD-10-CM | POA: Diagnosis not present

## 2014-01-17 DIAGNOSIS — G909 Disorder of the autonomic nervous system, unspecified: Secondary | ICD-10-CM | POA: Diagnosis not present

## 2014-01-17 DIAGNOSIS — C438 Malignant melanoma of overlapping sites of skin: Secondary | ICD-10-CM | POA: Diagnosis not present

## 2014-01-17 DIAGNOSIS — Z452 Encounter for adjustment and management of vascular access device: Secondary | ICD-10-CM | POA: Diagnosis not present

## 2014-01-17 DIAGNOSIS — C719 Malignant neoplasm of brain, unspecified: Secondary | ICD-10-CM | POA: Diagnosis not present

## 2014-01-17 DIAGNOSIS — Z483 Aftercare following surgery for neoplasm: Secondary | ICD-10-CM | POA: Diagnosis not present

## 2014-01-24 DIAGNOSIS — Z5181 Encounter for therapeutic drug level monitoring: Secondary | ICD-10-CM | POA: Diagnosis not present

## 2014-01-24 DIAGNOSIS — G909 Disorder of the autonomic nervous system, unspecified: Secondary | ICD-10-CM | POA: Diagnosis not present

## 2014-01-24 DIAGNOSIS — Z452 Encounter for adjustment and management of vascular access device: Secondary | ICD-10-CM | POA: Diagnosis not present

## 2014-01-24 DIAGNOSIS — C438 Malignant melanoma of overlapping sites of skin: Secondary | ICD-10-CM | POA: Diagnosis not present

## 2014-01-24 DIAGNOSIS — Z483 Aftercare following surgery for neoplasm: Secondary | ICD-10-CM | POA: Diagnosis not present

## 2014-01-24 DIAGNOSIS — C719 Malignant neoplasm of brain, unspecified: Secondary | ICD-10-CM | POA: Diagnosis not present

## 2014-01-25 ENCOUNTER — Telehealth: Payer: Self-pay | Admitting: Oncology

## 2014-01-25 NOTE — Telephone Encounter (Signed)
S/w pt advised appt chg from 12/14 to 12/16 @ 12 noon. Pt verbalized understanding.

## 2014-01-26 ENCOUNTER — Other Ambulatory Visit: Payer: Self-pay | Admitting: Internal Medicine

## 2014-01-31 DIAGNOSIS — C719 Malignant neoplasm of brain, unspecified: Secondary | ICD-10-CM | POA: Diagnosis not present

## 2014-01-31 DIAGNOSIS — Z5181 Encounter for therapeutic drug level monitoring: Secondary | ICD-10-CM | POA: Diagnosis not present

## 2014-01-31 DIAGNOSIS — C438 Malignant melanoma of overlapping sites of skin: Secondary | ICD-10-CM | POA: Diagnosis not present

## 2014-01-31 DIAGNOSIS — G909 Disorder of the autonomic nervous system, unspecified: Secondary | ICD-10-CM | POA: Diagnosis not present

## 2014-01-31 DIAGNOSIS — Z452 Encounter for adjustment and management of vascular access device: Secondary | ICD-10-CM | POA: Diagnosis not present

## 2014-01-31 DIAGNOSIS — Z483 Aftercare following surgery for neoplasm: Secondary | ICD-10-CM | POA: Diagnosis not present

## 2014-02-02 DIAGNOSIS — Z85820 Personal history of malignant melanoma of skin: Secondary | ICD-10-CM | POA: Diagnosis not present

## 2014-02-02 DIAGNOSIS — R911 Solitary pulmonary nodule: Secondary | ICD-10-CM | POA: Diagnosis not present

## 2014-02-02 DIAGNOSIS — D496 Neoplasm of unspecified behavior of brain: Secondary | ICD-10-CM | POA: Diagnosis not present

## 2014-02-02 DIAGNOSIS — K449 Diaphragmatic hernia without obstruction or gangrene: Secondary | ICD-10-CM | POA: Diagnosis not present

## 2014-02-02 DIAGNOSIS — K5790 Diverticulosis of intestine, part unspecified, without perforation or abscess without bleeding: Secondary | ICD-10-CM | POA: Diagnosis not present

## 2014-02-02 DIAGNOSIS — C439 Malignant melanoma of skin, unspecified: Secondary | ICD-10-CM | POA: Diagnosis not present

## 2014-02-02 DIAGNOSIS — C7931 Secondary malignant neoplasm of brain: Secondary | ICD-10-CM | POA: Diagnosis not present

## 2014-02-02 DIAGNOSIS — J9811 Atelectasis: Secondary | ICD-10-CM | POA: Diagnosis not present

## 2014-02-05 ENCOUNTER — Ambulatory Visit
Admission: RE | Admit: 2014-02-05 | Discharge: 2014-02-05 | Disposition: A | Discharge status: Home / Self Care | Payer: BC Managed Care – PPO | Source: Ambulatory Visit | Attending: Radiation Oncology | Admitting: Radiation Oncology

## 2014-02-05 ENCOUNTER — Other Ambulatory Visit: Payer: Self-pay | Admitting: Radiation Oncology

## 2014-02-05 DIAGNOSIS — C4490 Unspecified malignant neoplasm of skin, unspecified: Secondary | ICD-10-CM

## 2014-02-07 DIAGNOSIS — C719 Malignant neoplasm of brain, unspecified: Secondary | ICD-10-CM | POA: Diagnosis not present

## 2014-02-07 DIAGNOSIS — C438 Malignant melanoma of overlapping sites of skin: Secondary | ICD-10-CM | POA: Diagnosis not present

## 2014-02-07 DIAGNOSIS — G909 Disorder of the autonomic nervous system, unspecified: Secondary | ICD-10-CM | POA: Diagnosis not present

## 2014-02-07 DIAGNOSIS — Z5181 Encounter for therapeutic drug level monitoring: Secondary | ICD-10-CM | POA: Diagnosis not present

## 2014-02-07 DIAGNOSIS — Z452 Encounter for adjustment and management of vascular access device: Secondary | ICD-10-CM | POA: Diagnosis not present

## 2014-02-07 DIAGNOSIS — Z483 Aftercare following surgery for neoplasm: Secondary | ICD-10-CM | POA: Diagnosis not present

## 2014-02-08 ENCOUNTER — Telehealth: Payer: Self-pay | Admitting: Internal Medicine

## 2014-02-08 ENCOUNTER — Ambulatory Visit: Payer: BC Managed Care – PPO | Admitting: Oncology

## 2014-02-08 ENCOUNTER — Other Ambulatory Visit: Payer: Self-pay | Admitting: *Deleted

## 2014-02-08 DIAGNOSIS — E785 Hyperlipidemia, unspecified: Secondary | ICD-10-CM

## 2014-02-08 NOTE — Telephone Encounter (Signed)
New Prob   Pt is calling to inquire about an alternative for Crestor as this is no longer affordable for her. Please call.

## 2014-02-08 NOTE — Telephone Encounter (Signed)
She now has Medicare, so Crestor is hundreds of dollars. She didn't pick it up. Has been on Gen Lipitor in the past. Wants to know if you would change it. Recently spent $30,000. On cancer treatment.

## 2014-02-08 NOTE — Telephone Encounter (Signed)
Patinet told to come in for fasting lipids

## 2014-02-09 ENCOUNTER — Other Ambulatory Visit (INDEPENDENT_AMBULATORY_CARE_PROVIDER_SITE_OTHER): Payer: Medicare Other | Admitting: *Deleted

## 2014-02-09 DIAGNOSIS — E785 Hyperlipidemia, unspecified: Secondary | ICD-10-CM

## 2014-02-09 LAB — LIPID PANEL
Cholesterol: 145 mg/dL (ref 0–200)
HDL: 47.9 mg/dL (ref >39.00)
LDL Cholesterol: 73 mg/dL (ref 0–99)
NonHDL: 97.1
Total CHOL/HDL Ratio: 3
Triglycerides: 123 mg/dL (ref 0.0–149.0)
VLDL: 24.6 mg/dL (ref 0.0–40.0)

## 2014-02-09 LAB — HEPATIC FUNCTION PANEL
ALT: 19 U/L (ref 0–35)
AST: 21 U/L (ref 0–37)
Albumin: 4.3 g/dL (ref 3.5–5.2)
Alkaline Phosphatase: 154 U/L — ABNORMAL HIGH (ref 39–117)
Bilirubin, Direct: 0 mg/dL (ref 0.0–0.3)
Total Bilirubin: 0.4 mg/dL (ref 0.2–1.2)
Total Protein: 7.2 g/dL (ref 6.0–8.3)

## 2014-02-09 NOTE — Telephone Encounter (Signed)
Patient stopped by for lab work today and requested samples of Crestor until a decision is made for long term statin use. A 2 pack sample of crestor 10mg  were provided and she was advised to cut in half for a half a tab per day which will equal 5mg  per day. She will await a call back after lab review for further instructions.

## 2014-02-10 ENCOUNTER — Telehealth: Payer: Self-pay | Admitting: Oncology

## 2014-02-10 ENCOUNTER — Ambulatory Visit
Admission: RE | Admit: 2014-02-10 | Discharge: 2014-02-10 | Disposition: A | Discharge status: Home / Self Care | Payer: BC Managed Care – PPO | Source: Ambulatory Visit | Attending: Radiation Oncology | Admitting: Radiation Oncology

## 2014-02-10 ENCOUNTER — Encounter: Payer: Self-pay | Admitting: Radiation Oncology

## 2014-02-10 ENCOUNTER — Ambulatory Visit (HOSPITAL_BASED_OUTPATIENT_CLINIC_OR_DEPARTMENT_OTHER): Payer: Medicare Other | Admitting: Oncology

## 2014-02-10 VITALS — BP 128/80 | HR 82 | Temp 97.7°F | Resp 20 | Ht 63.0 in | Wt 155.9 lb

## 2014-02-10 VITALS — BP 126/80 | HR 107 | Temp 97.8°F | Resp 18 | Ht 63.0 in | Wt 155.3 lb

## 2014-02-10 DIAGNOSIS — K769 Liver disease, unspecified: Secondary | ICD-10-CM

## 2014-02-10 DIAGNOSIS — C799 Secondary malignant neoplasm of unspecified site: Secondary | ICD-10-CM

## 2014-02-10 DIAGNOSIS — C439 Malignant melanoma of skin, unspecified: Secondary | ICD-10-CM

## 2014-02-10 DIAGNOSIS — C7931 Secondary malignant neoplasm of brain: Secondary | ICD-10-CM | POA: Diagnosis not present

## 2014-02-10 DIAGNOSIS — C4360 Malignant melanoma of unspecified upper limb, including shoulder: Secondary | ICD-10-CM

## 2014-02-10 DIAGNOSIS — R197 Diarrhea, unspecified: Secondary | ICD-10-CM | POA: Diagnosis not present

## 2014-02-10 DIAGNOSIS — R911 Solitary pulmonary nodule: Secondary | ICD-10-CM

## 2014-02-10 DIAGNOSIS — R740 Nonspecific elevation of levels of transaminase and lactic acid dehydrogenase [LDH]: Secondary | ICD-10-CM

## 2014-02-10 NOTE — Telephone Encounter (Signed)
gv and printed appt sched and avs for pt for Jan 2016 °

## 2014-02-10 NOTE — Progress Notes (Signed)
  [place] OFFICE PROGRESS NOTE   Diagnosis: Melanoma  INTERVAL HISTORY:   She returns as scheduled. She continues to have 5-6 loose stools per day. The stool frequency varies with her diet. She complains of aching in the hands and soles of the feet. No swelling. She saw Dr.Colichio on 59/16/3846. No new metastatic disease. Stable subcentimeter hepatic hypodensities. Increased soft tissue in the anterior mediastinum potentially related to thymic rebound. The LDH remained elevated.  Objective:  Vital signs in last 24 hours:  Blood pressure 126/80, pulse 107, temperature 97.8 F (36.6 C), temperature source Oral, resp. rate 18, height $RemoveBe'5\' 3"'ynASKbGcm$  (1.6 m), weight 155 lb 4.8 oz (70.444 kg), SpO2 99 %.    HEENT: Neck without mass Lymphatics: No cervical or supra-clavicular nodes Resp: Lungs clear bilaterally Cardio: Regular rate and rhythm GI: No hepatomegaly, nontender Vascular: No leg edema Neuro: Alert and oriented, the motor exam appears intact in the upper and lower extremities  Skin: Right upper arm scar without evidence of recurrent tumor   Medications: I have reviewed the patient's current medications.  Assessment/Plan: 1.Metastatic melanoma  -Status post resection of a right arm melanoma, stage II A (T2b N0) in December 2009  -Metastatic melanoma, status post resection of a left cerebellar metastasis 09/30/2012, BRAF mutation identified  -Status post SRS treatment of the left cerebellum and a left parietal metastasis 10/24/2012  -Staging CTs and PET scan without evidence of distant metastatic disease aside from indeterminate small lung nodules  -Restaging CTs 12/01/2012 consistent with enlargement of the left parietal mass and right lower lobe lung nodule  -Initiation of dabrafenib and tremetinib 12/17/2012. She reports extremely poor tolerance. Discontinued.  -Status post resection of a left parietal metastasis 02/09/2013.  -Restaging CT evaluation  03/03/2013. Stable 1.5 cm right lower lobe pulmonary nodule. Multiple peripheral enhancing lesions throughout the liver.  -Initiation of Ipilimumab at [facility] 03/10/2013.  -Cycle 2 Ipilumumab 04/17/2013  -Cycle 3 Ipilumumab 05/14/2013  -Cycle 4 Ipilumumab 06/04/2013  -Restaging CT at [facility] 06/25/2013 with multiple subcentimeter liver lesions, no new lesions, decreased right lower lobe nodule.  -Brain MRI 07/07/2013 with no evidence of progressive metastatic disease.  -Brain CT 10/09/2013 with no evidence of progressive metastatic disease  -Brain CT and CTs of the chest, abdomen, and pelvis at [facility] on 11/03/2013 with no evidence of progressive disease  -CTs of the brain, chest, abdomen, and pelvis at [facility] on 02/02/2014 with no evidence of disease progression 2. Arrhythmia, pacemaker in place.  3. Hypertension  4. History of a CVA  5. History of deep vein thrombosis  6. History of proximal leg weakness-likely related to chronic steroid use.  7. Intermittent rash at the posterior thighs and left hand-likely related to systemic therapy.  8. Leg edema-? Related to liver disease,? Secondary to ipilumumab . Improved on furosemide.  9. Headache and mild nausea-coincided with discontinuation of Decadron, CT of the brain 03/25/2011 with no acute change.  10. 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 History of Mild elevation of lipase-likely mild asymptomatic pancreatitis related to Ipilumumab    Disposition:  She remains in clinical remission from melanoma. She will continue x-ray surveillance with Dr.Collichio. She is scheduled for a follow-up appointment at [facility] 02/25/2014. She will return for an office visit here in one month.  Betsy Coder, MD  02/10/2014  12:43 PM

## 2014-02-10 NOTE — Progress Notes (Addendum)
Follow up s/p brain srs 10/24/12, saw Dr. Benay Spice upstairs today  No head aches, some nausea at times, CT head at [place] notes given to MD.  Energy level moderate good, appetite good,, still having numerous loose stools daily,  Not taking imodium,  blood work a little off will continue to monitor per Dr. Benay Spice 1:32 PM

## 2014-02-11 DIAGNOSIS — C7931 Secondary malignant neoplasm of brain: Secondary | ICD-10-CM | POA: Diagnosis not present

## 2014-02-11 DIAGNOSIS — Z6827 Body mass index (BMI) 27.0-27.9, adult: Secondary | ICD-10-CM | POA: Diagnosis not present

## 2014-02-12 ENCOUNTER — Encounter: Payer: Self-pay | Admitting: Radiation Oncology

## 2014-02-12 NOTE — Progress Notes (Signed)
Radiation Oncology         [phone] ________________________________  Name: [name] MRN: [id number]  Date: 02/10/2014  DOB: 08-03-1955  Follow-Up Visit Note  CC: [name], MD  Regis Bill Standley Brooking, MD  Diagnosis:   Metastatic melanoma with brain metastasis  Narrative:  The patient returns today for routine follow-up.  The patient has continued to be followed at [facility] and also saw Dr. Benay Spice earlier today here in medical oncology. Overall she has been doing quite well. She does continue to have loose stools as well as some aching in the hands and soles of her feet. This was discussed today in medical oncology. No new headaches or nausea and overall the patient has felt quite well. She has continued to have repeat imaging performed at [facility]. A repeat CT scan of the head with contrast was completed on 02/02/2014. This showed no new concerning findings. Postsurgical changes were present in the left occipital region and the left parietal calvarium. The patient also had repeat CT imaging of the chest abdomen and pelvis. No new metastatic disease seen in the chest abdomen or pelvis.                              ALLERGIES:  is allergic to doxycycline; erythromycin; penicillins; preparation h; promethazine; sulfonamide derivatives; and phenergan.  Meds: Current Outpatient Prescriptions  Medication Sig Dispense Refill  . acetaminophen (TYLENOL) 500 MG tablet Take 1,000 mg by mouth every 6 (six) hours as needed for pain.    Marland Kitchen albuterol (VENTOLIN HFA) 108 (90 BASE) MCG/ACT inhaler Inhale 2 puffs into the lungs as needed.    . clonazePAM (KLONOPIN) 0.5 MG tablet Take 1 tablet (0.5 mg total) by mouth 3 (three) times daily as needed for anxiety. 50 tablet 0  . cloNIDine (CATAPRES) 0.1 MG tablet Take 1 tablet (0.1 mg total) by mouth at bedtime. 90 tablet 1  . CRESTOR 5 MG tablet TAKE ONE TABLET BY MOUTH ONE TIME DAILY  90 tablet 1  . cyanocobalamin (,VITAMIN B-12,)  1000 MCG/ML injection INJECT 1 ML UNDER THE SKIN AS DIRECTED ONCE A MONTH 3 mL 0  . diphenhydrAMINE (BENADRYL) 25 MG tablet Take 12.5 mg by mouth every 6 (six) hours as needed. Seasonal allergies    . esomeprazole (NEXIUM) 40 MG capsule Take 40 mg by mouth 2 (two) times daily.    . furosemide (LASIX) 40 MG tablet Take 40 mg by mouth daily.     . hydrocortisone (ANUSOL-HC) 25 MG suppository Place 25 mg rectally at bedtime as needed for hemorrhoids.    . hydrocortisone 2.5 % cream Apply 1 application topically 2 (two) times daily as needed (irritation).     Marland Kitchen levETIRAcetam (KEPPRA) 500 MG tablet Take 500 mg by mouth 2 (two) times daily.     . mesalamine (CANASA) 1000 MG suppository Place 1,000 mg rectally at bedtime as needed (hemorrhoids).     . metFORMIN (GLUCOPHAGE) 500 MG tablet Take 500 mg by mouth daily after breakfast.    . metoprolol tartrate (LOPRESSOR) 25 MG tablet Take 25 mg by mouth 2 (two) times daily.     . ondansetron (ZOFRAN-ODT) 4 MG disintegrating tablet Take 4 mg by mouth as needed. 4mg  ODT q4 hours prn nausea/vomit    . polyethylene glycol powder (GLYCOLAX/MIRALAX) powder Stir 17 grams in 8oz of water, or juice, 1 or 2 times daily for constipation    .  potassium chloride 20 MEQ/15ML (10%) solution Take 29mls by mouth 4 times daily    . Vitamin D, Ergocalciferol, (DRISDOL) 50000 UNITS CAPS capsule Take 50,000 Units by mouth every 7 (seven) days.     No current facility-administered medications for this encounter.    Physical Findings: The patient is in no acute distress. Patient is alert and oriented.  height is 5\' 3"  (1.6 m) and weight is 155 lb 14.4 oz (70.716 kg). Her oral temperature is 97.7 F (36.5 C). Her blood pressure is 128/80 and her pulse is 82. Her respiration is 20. .     Lab Findings: Lab Results  Component Value Date   WBC 5.8 01/14/2014   HGB 12.5 01/14/2014   HCT 39.0 01/14/2014   MCV 80.7 01/14/2014   PLT 259 01/14/2014     Radiographic  Findings: No results found.  Impression:    The patient clinically is doing well and her scans did not reveal any progressive or worrisome disease at this time consisted of a CT scan of the head, chest, abdomen and pelvis at [facility]. She is scheduled to see them again later this month to check her tumor markers which have been increasing discomfort gray. If these continue to rise then the patient indicated that she would likely proceed with a repeat MRI scan of the brain in the near future. We will hold off on scheduling any definite follow-up at this time.   Plan:  The patient states that she will let us know what comes of her next visit at [facility] and we can then schedule her for follow-up or repeat evaluation as necessary.  I spent 15 minutes with the patient today, the majority of which was spent counseling the patient on the diagnosis of cancer and coordinating care.   Jodelle Gross, M.D., Ph.D.

## 2014-02-21 DIAGNOSIS — Z483 Aftercare following surgery for neoplasm: Secondary | ICD-10-CM | POA: Diagnosis not present

## 2014-02-21 DIAGNOSIS — C438 Malignant melanoma of overlapping sites of skin: Secondary | ICD-10-CM | POA: Diagnosis not present

## 2014-02-21 DIAGNOSIS — C719 Malignant neoplasm of brain, unspecified: Secondary | ICD-10-CM | POA: Diagnosis not present

## 2014-02-21 DIAGNOSIS — Z452 Encounter for adjustment and management of vascular access device: Secondary | ICD-10-CM | POA: Diagnosis not present

## 2014-02-21 DIAGNOSIS — Z5181 Encounter for therapeutic drug level monitoring: Secondary | ICD-10-CM | POA: Diagnosis not present

## 2014-02-21 DIAGNOSIS — G909 Disorder of the autonomic nervous system, unspecified: Secondary | ICD-10-CM | POA: Diagnosis not present

## 2014-02-25 DIAGNOSIS — C439 Malignant melanoma of skin, unspecified: Secondary | ICD-10-CM | POA: Diagnosis not present

## 2014-03-02 ENCOUNTER — Encounter: Payer: Self-pay | Admitting: Internal Medicine

## 2014-03-02 ENCOUNTER — Ambulatory Visit (INDEPENDENT_AMBULATORY_CARE_PROVIDER_SITE_OTHER): Payer: Medicare Other | Admitting: Internal Medicine

## 2014-03-02 VITALS — BP 124/94 | HR 86 | Ht 63.0 in | Wt 150.0 lb

## 2014-03-02 DIAGNOSIS — Z45018 Encounter for adjustment and management of other part of cardiac pacemaker: Secondary | ICD-10-CM

## 2014-03-02 DIAGNOSIS — I495 Sick sinus syndrome: Secondary | ICD-10-CM

## 2014-03-02 LAB — MDC_IDC_ENUM_SESS_TYPE_INCLINIC
Date Time Interrogation Session: 20160105161128
Implantable Pulse Generator Model: 359524
Implantable Pulse Generator Serial Number: 66195584
Lead Channel Impedance Value: 390 Ohm
Lead Channel Impedance Value: 546 Ohm
Lead Channel Pacing Threshold Amplitude: 0.9 V
Lead Channel Pacing Threshold Amplitude: 2 V
Lead Channel Pacing Threshold Pulse Width: 0.4 ms
Lead Channel Pacing Threshold Pulse Width: 1 ms
Lead Channel Sensing Intrinsic Amplitude: 6.3 mV
Lead Channel Sensing Intrinsic Amplitude: 6.6 mV
Lead Channel Setting Pacing Amplitude: 2 V
Lead Channel Setting Pacing Amplitude: 4 V
Lead Channel Setting Pacing Pulse Width: 1 ms

## 2014-03-02 NOTE — Progress Notes (Signed)
Patient Care Team: Burnis Medin, MD as PCP - General   HPI  [name] is a 59 y.o. female Seen for device interrogation following cranial radiation recently identified metastatic melanoma.  She has a history of a pacemaker for sinus node dysfunction in the setting of dysautonomia  Past Medical History  Diagnosis Date  . 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 Hypoglycemia, unspecified   . Other nonspecific abnormal serum enzyme levels   . Cervicalgia   . Disturbance of skin sensation   . Other specified congenital anomalies of nervous system   . Swelling of limb   . Disorder of bone and cartilage, unspecified   . Atrial tachycardia   . CVA (cerebral infarction)     2012 with dizziness and vision change felt embolic from atrial tachy  . History of pacemaker   . POTS (postural orthostatic tachycardia syndrome)   . Diverticulosis   . Osteopenia   . DVT (deep venous thrombosis)   . Atrial fibrillation   . DM (diabetes mellitus)   . DVT (deep venous thrombosis)   . HLD (hyperlipidemia)   . HTN (hypertension)   . Pacemaker     autonomic dysfunction  . Complication of anesthesia     pt states wakes up with "shakes"  . PONV (postoperative nausea and vomiting)   . Stroke     left weaker   . History of radiation therapy 10/24/12    brain  . Skin cancer     Hx: of lung lesion  . Family history of anesthesia complication     PONV  . Asthma     Past Surgical History  Procedure Laterality Date  . Insert / replace / remove pacemaker  2012    1999, x 3  . Ablation saphenous vein w/ rfa      2002  . Tonsillectomy and adenoidectomy    . Carpal tunnel release      left  . Nose surgery  2010, 2012    for nose bleeds x 2  . Fatty tumor removed      from chest  . Melanoma excision      with removal of lymph nodes, left shoulder  . Deep axillary sentinel node biopsy / excision      due to extensive Melanoma-right arm  . Laparoscopy   09/06/2011    Procedure: LAPAROSCOPY OPERATIVE;  Surgeon: Claiborne Billings A. Pamala Hurry, MD;  Location: [facility];  Service: Gynecology;  Laterality: Left;  with Left Ovarian Cystectomy   . Craniotomy N/A 09/30/2012    suboccipital craniectomy  . Craniotomy Left 02/09/2013    Procedure: LEFT PARIETAL CRANIOTOMY with stealth;  Surgeon: [name], MD;  Location: [facility];  Service: Neurosurgery;  Laterality: Left;  LEFT Parietal Craniotomy for tumor with stealth    Current Outpatient Prescriptions  Medication Sig Dispense Refill  . acetaminophen (TYLENOL) 500 MG tablet Take 1,000 mg by mouth every 6 (six) hours as needed for pain.    Marland Kitchen albuterol (VENTOLIN HFA) 108 (90 BASE) MCG/ACT inhaler Inhale 2 puffs into the lungs as needed.    . clonazePAM (KLONOPIN) 0.5 MG tablet Take 1 tablet (0.5 mg total) by mouth 3 (three) times daily as needed for anxiety. 50 tablet 0  . cloNIDine (CATAPRES) 0.1 MG tablet Take 1 tablet (0.1 mg total) by mouth at bedtime. 90 tablet 1  . cyanocobalamin (,VITAMIN B-12,) 1000 MCG/ML injection INJECT 1 ML UNDER THE SKIN AS DIRECTED  ONCE A MONTH 3 mL 0  . diphenhydrAMINE (BENADRYL) 25 MG tablet Take 12.5 mg by mouth every 6 (six) hours as needed. Seasonal allergies    . esomeprazole (NEXIUM) 40 MG capsule Take 40 mg by mouth 2 (two) times daily.    . furosemide (LASIX) 40 MG tablet Take 40 mg by mouth daily.     . hydrocortisone (ANUSOL-HC) 25 MG suppository Place 25 mg rectally at bedtime as needed for hemorrhoids.    . hydrocortisone 2.5 % cream Apply 1 application topically 2 (two) times daily as needed (irritation).     Marland Kitchen levETIRAcetam (KEPPRA) 500 MG tablet Take 500 mg by mouth 2 (two) times daily.     . mesalamine (CANASA) 1000 MG suppository Place 1,000 mg rectally at bedtime as needed (hemorrhoids).     . metFORMIN (GLUCOPHAGE) 500 MG tablet Take 500 mg by mouth daily after breakfast.    . metoprolol tartrate (LOPRESSOR) 25 MG tablet Take 25 mg by mouth 2 (two) times daily.      . ondansetron (ZOFRAN-ODT) 4 MG disintegrating tablet Take 4 mg by mouth as needed. 4mg  ODT q4 hours prn nausea/vomit    . polyethylene glycol powder (GLYCOLAX/MIRALAX) powder Stir 17 grams in 8oz of water, or juice, 1 or 2 times daily for constipation    . potassium chloride 20 MEQ/15ML (10%) solution Take 42mls by mouth 4 times daily    . Vitamin D, Ergocalciferol, (DRISDOL) 50000 UNITS CAPS capsule Take 50,000 Units by mouth every 7 (seven) days.    . CRESTOR 5 MG tablet TAKE ONE TABLET BY MOUTH ONE TIME DAILY  (Patient not taking: Reported on 03/02/2014) 90 tablet 1   No current facility-administered medications for this visit.    Allergies  Allergen Reactions  . Doxycycline Hives  . Erythromycin Hives  . Penicillins Hives  . Preparation H [Pramox-Pe-Glycerin-Petrolatum] Hives    Cannot use  Cream or Suppositories   . Promethazine     Other reaction(s): Other (See Comments) Causes restless leg syndrome  . Sulfonamide Derivatives Hives  . Phenergan [Promethazine Hcl] Other (See Comments)    Causes restless leg syndrome    Review of Systems negative except from HPI and PMH  Physical Exam BP 124/94 mmHg  Pulse 86  Ht 5\' 3"  (1.6 m)  Wt 150 lb (68.04 kg)  BMI 26.58 kg/m2 Device pocket well healed; without hematoma or erythema.  There is no tethering  RRR No edema   Assessment and  Plan  Atrial  Tach  Pacemaker   Stable

## 2014-03-02 NOTE — Patient Instructions (Signed)
Your physician recommends that you continue on your current medications as directed. Please refer to the Current Medication list given to you today.  Your physician wants you to follow-up in: 1 year with Dr. Klein.  You will receive a reminder letter in the mail two months in advance. If you don't receive a letter, please call our office to schedule the follow-up appointment.  

## 2014-03-16 ENCOUNTER — Telehealth: Payer: Self-pay | Admitting: Oncology

## 2014-03-16 ENCOUNTER — Telehealth: Payer: Self-pay | Admitting: *Deleted

## 2014-03-16 ENCOUNTER — Other Ambulatory Visit: Payer: Self-pay | Admitting: *Deleted

## 2014-03-16 DIAGNOSIS — C799 Secondary malignant neoplasm of unspecified site: Secondary | ICD-10-CM

## 2014-03-16 DIAGNOSIS — C439 Malignant melanoma of skin, unspecified: Secondary | ICD-10-CM

## 2014-03-16 NOTE — Telephone Encounter (Signed)
PT. HAS HAD ISSUES WITH HER COLITIS AND PANCREATITIS. SHE WOULD LIKE DR.SHERRILL TO HAVE A CBC, LIPASE, AND LDH BEFORE HER VISIT TOMORROW. ROUTED TO DR.Conyers AND SUSAN COWARD,RN.

## 2014-03-16 NOTE — Telephone Encounter (Signed)
Lft msg for pt to confirm labs/ov per 01/19 POF..... KJ

## 2014-03-17 ENCOUNTER — Telehealth: Payer: Self-pay | Admitting: *Deleted

## 2014-03-17 ENCOUNTER — Ambulatory Visit (HOSPITAL_BASED_OUTPATIENT_CLINIC_OR_DEPARTMENT_OTHER): Payer: Medicare Other | Admitting: Lab

## 2014-03-17 ENCOUNTER — Other Ambulatory Visit (HOSPITAL_BASED_OUTPATIENT_CLINIC_OR_DEPARTMENT_OTHER): Payer: Medicare Other | Admitting: *Deleted

## 2014-03-17 ENCOUNTER — Ambulatory Visit (HOSPITAL_BASED_OUTPATIENT_CLINIC_OR_DEPARTMENT_OTHER): Payer: Medicare Other | Admitting: Oncology

## 2014-03-17 ENCOUNTER — Telehealth: Payer: Self-pay | Admitting: Oncology

## 2014-03-17 ENCOUNTER — Other Ambulatory Visit: Payer: Self-pay | Admitting: Internal Medicine

## 2014-03-17 VITALS — BP 115/82 | HR 77 | Temp 97.7°F | Resp 18 | Ht 63.0 in | Wt 151.1 lb

## 2014-03-17 DIAGNOSIS — Z86718 Personal history of other venous thrombosis and embolism: Secondary | ICD-10-CM | POA: Diagnosis not present

## 2014-03-17 DIAGNOSIS — Z8673 Personal history of transient ischemic attack (TIA), and cerebral infarction without residual deficits: Secondary | ICD-10-CM | POA: Diagnosis not present

## 2014-03-17 DIAGNOSIS — C4360 Malignant melanoma of unspecified upper limb, including shoulder: Secondary | ICD-10-CM

## 2014-03-17 DIAGNOSIS — I10 Essential (primary) hypertension: Secondary | ICD-10-CM | POA: Diagnosis not present

## 2014-03-17 DIAGNOSIS — C7931 Secondary malignant neoplasm of brain: Secondary | ICD-10-CM

## 2014-03-17 DIAGNOSIS — R911 Solitary pulmonary nodule: Secondary | ICD-10-CM | POA: Diagnosis not present

## 2014-03-17 DIAGNOSIS — K769 Liver disease, unspecified: Secondary | ICD-10-CM | POA: Diagnosis not present

## 2014-03-17 DIAGNOSIS — C799 Secondary malignant neoplasm of unspecified site: Secondary | ICD-10-CM | POA: Diagnosis not present

## 2014-03-17 DIAGNOSIS — E119 Type 2 diabetes mellitus without complications: Secondary | ICD-10-CM | POA: Diagnosis not present

## 2014-03-17 DIAGNOSIS — C439 Malignant melanoma of skin, unspecified: Secondary | ICD-10-CM

## 2014-03-17 LAB — COMPREHENSIVE METABOLIC PANEL (CC13)
ALT: 21 U/L (ref 0–55)
AST: 21 U/L (ref 5–34)
Albumin: 4.1 g/dL (ref 3.5–5.0)
Alkaline Phosphatase: 192 U/L — ABNORMAL HIGH (ref 40–150)
Anion Gap: 10 mEq/L (ref 3–11)
BUN: 14.6 mg/dL (ref 7.0–26.0)
CO2: 24 mEq/L (ref 22–29)
Calcium: 9.2 mg/dL (ref 8.4–10.4)
Chloride: 105 mEq/L (ref 98–109)
Creatinine: 0.8 mg/dL (ref 0.6–1.1)
EGFR: 81 mL/min/{1.73_m2} — ABNORMAL LOW (ref >90)
Glucose: 78 mg/dl (ref 70–140)
Potassium: 4.8 mEq/L (ref 3.5–5.1)
Sodium: 140 mEq/L (ref 136–145)
Total Bilirubin: 0.21 mg/dL (ref 0.20–1.20)
Total Protein: 7 g/dL (ref 6.4–8.3)

## 2014-03-17 LAB — CBC WITH DIFFERENTIAL/PLATELET
BASO%: 1.2 % (ref 0.0–2.0)
Basophils Absolute: 0.1 10*3/uL (ref 0.0–0.1)
EOS%: 1.5 % (ref 0.0–7.0)
Eosinophils Absolute: 0.1 10*3/uL (ref 0.0–0.5)
HCT: 37.8 % (ref 34.8–46.6)
HGB: 12 g/dL (ref 11.6–15.9)
LYMPH%: 30.8 % (ref 14.0–49.7)
MCH: 25.1 pg (ref 25.1–34.0)
MCHC: 31.9 g/dL (ref 31.5–36.0)
MCV: 78.9 fL — ABNORMAL LOW (ref 79.5–101.0)
MONO#: 0.6 10*3/uL (ref 0.1–0.9)
MONO%: 12.3 % (ref 0.0–14.0)
NEUT#: 2.7 10*3/uL (ref 1.5–6.5)
NEUT%: 54.2 % (ref 38.4–76.8)
Platelets: 291 10*3/uL (ref 145–400)
RBC: 4.79 10*6/uL (ref 3.70–5.45)
RDW: 14.6 % — ABNORMAL HIGH (ref 11.2–14.5)
WBC: 5.1 10*3/uL (ref 3.9–10.3)
lymph#: 1.6 10*3/uL (ref 0.9–3.3)

## 2014-03-17 LAB — LACTATE DEHYDROGENASE (CC13): LDH: 225 U/L (ref 125–245)

## 2014-03-17 NOTE — Progress Notes (Signed)
[place] OFFICE PROGRESS NOTE   Diagnosis: Melanoma  INTERVAL HISTORY:   She returns as scheduled. No seizures or neurologic symptoms. She complains of frequent bowel movements for the past 2 weeks. She has a soft stool after eating. No frank diarrhea. There is mild associated left lower abdominal pain. No fever.  She is scheduled for a restaging CT evaluation at [facility] in March.  Objective:  Vital signs in last 24 hours:  Blood pressure 115/82, pulse 77, temperature 97.7 F (36.5 C), temperature source Oral, resp. rate 18, height 5' 3" (1.6 m), weight 151 lb 1.6 oz (68.539 kg), SpO2 100 %.    HEENT: Neck without mass Lymphatics: No cervical or supra-clavicular nodes Resp: Lungs clear bilaterally Cardio: Regular rate and rhythm GI: No hepatosplenomegaly, no mass, mild tenderness in the left mid lateral abdomen. The abdomen is soft. Vascular: No leg edema Neuro: Alert and oriented, the motor exam is intact in the upper and lower extremities  Skin: Right arm scar without evidence of recurrent tumor    Lab Results:  Lab Results  Component Value Date   WBC 5.1 03/17/2014   HGB 12.0 03/17/2014   HCT 37.8 03/17/2014   MCV 78.9* 03/17/2014   PLT 291 03/17/2014   NEUTROABS 2.7 03/17/2014    Medications: I have reviewed the patient's current medications.  Assessment/Plan: 1.Metastatic melanoma  -Status post resection of a right arm melanoma, stage II A (T2b N0) in December 2009  -Metastatic melanoma, status post resection of a left cerebellar metastasis 09/30/2012, BRAF mutation identified  -Status post SRS treatment of the left cerebellum and a left parietal metastasis 10/24/2012  -Staging CTs and PET scan without evidence of distant metastatic disease aside from indeterminate small lung nodules  -Restaging CTs 12/01/2012 consistent with enlargement of the left parietal mass and right lower lobe lung nodule  -Initiation of dabrafenib and tremetinib  12/17/2012. She reports extremely poor tolerance. Discontinued.  -Status post resection of a left parietal metastasis 02/09/2013.  -Restaging CT evaluation 03/03/2013. Stable 1.5 cm right lower lobe pulmonary nodule. Multiple peripheral enhancing lesions throughout the liver.  -Initiation of Ipilimumab at [facility] 03/10/2013.  -Cycle 2 Ipilumumab 04/17/2013  -Cycle 3 Ipilumumab 05/14/2013  -Cycle 4 Ipilumumab 06/04/2013  -Restaging CT at [facility] 06/25/2013 with multiple subcentimeter liver lesions, no new lesions, decreased right lower lobe nodule.  -Brain MRI 07/07/2013 with no evidence of progressive metastatic disease.  -Brain CT 10/09/2013 with no evidence of progressive metastatic disease  -Brain CT and CTs of the chest, abdomen, and pelvis at [facility] on 11/03/2013 with no evidence of progressive disease  -CTs of the brain, chest, abdomen, and pelvis at [facility] on 02/02/2014 with no evidence of disease progression 2. Arrhythmia, pacemaker in place.  3. Hypertension  4. History of a CVA  5. History of deep vein thrombosis  6. History of proximal leg weakness-likely related to chronic steroid use.  7. Intermittent rash at the posterior thighs and left hand-likely related to systemic therapy.  8. Leg edema-? Related to liver disease,? Secondary to ipilumumab . Improved on furosemide.  9. Headache and mild nausea-coincided with discontinuation of Decadron, CT of the brain 03/25/2011 with no acute change.  10. 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 History of Mild elevation of lipase-likely mild asymptomatic pancreatitis related to Ipilumumab   Disposition:  Her overall status appears unchanged. She has more frequent bowel movements after eating. It is possible this is related to mild diverticulitis. It  is also possible this is related to polypharmacy, irritable bowel syndrome, or ipilumumab. She will contact her diabetes physician to discuss  coming off of metformin. She will contact us if she develops frank diarrhea or her symptoms are not improved within the next few weeks. We will consult with gastroenterology and decide on resuming steroids as indicated.  She will be scheduled for an office visit following the restaging CT at [facility] in March.  Betsy Coder, MD  03/17/2014  11:23 AM

## 2014-03-17 NOTE — Telephone Encounter (Signed)
Gave avs & calendar for March. °

## 2014-03-17 NOTE — Telephone Encounter (Signed)
Per Dr. Benay Spice; Faythe Ghee to fax CMET results, per patient request, to home fax [phone].

## 2014-03-18 ENCOUNTER — Other Ambulatory Visit: Payer: Self-pay | Admitting: *Deleted

## 2014-03-18 ENCOUNTER — Other Ambulatory Visit: Payer: Self-pay

## 2014-03-18 ENCOUNTER — Telehealth: Payer: Self-pay | Admitting: *Deleted

## 2014-03-18 DIAGNOSIS — D509 Iron deficiency anemia, unspecified: Secondary | ICD-10-CM

## 2014-03-18 DIAGNOSIS — K529 Noninfective gastroenteritis and colitis, unspecified: Secondary | ICD-10-CM

## 2014-03-18 DIAGNOSIS — C7931 Secondary malignant neoplasm of brain: Secondary | ICD-10-CM

## 2014-03-18 DIAGNOSIS — C439 Malignant melanoma of skin, unspecified: Secondary | ICD-10-CM

## 2014-03-18 LAB — FERRITIN CHCC: Ferritin: 9 ng/ml (ref 9–269)

## 2014-03-18 LAB — LIPASE: Lipase: 66 U/L (ref 0–75)

## 2014-03-18 MED ORDER — FERROUS SULFATE 325 (65 FE) MG PO TBEC: 325.0000 mg | DELAYED_RELEASE_TABLET | Freq: Every day | ORAL | Status: DC

## 2014-03-18 MED ORDER — POTASSIUM CHLORIDE 20 MEQ/15ML (10%) PO SOLN: 20.0000 meq | Freq: Two times a day (BID) | ORAL | Status: DC

## 2014-03-18 NOTE — Telephone Encounter (Signed)
Made her aware of ferritin, lipase, LDH results. She will pick up stool cards to be tested and then start ferrous sulfate 325 mg daily.

## 2014-03-18 NOTE — Telephone Encounter (Signed)
-----   Message from [name], MD sent at 03/18/2014  1:51 PM EST ----- Please call patient, iron is low, start ferrous sulfate 325mg  daily, repeat ferritin and cbc 1 month, ask her to return hemoccult cards

## 2014-03-19 ENCOUNTER — Other Ambulatory Visit: Payer: Self-pay

## 2014-03-19 ENCOUNTER — Ambulatory Visit (HOSPITAL_BASED_OUTPATIENT_CLINIC_OR_DEPARTMENT_OTHER): Payer: Medicare Other

## 2014-03-19 DIAGNOSIS — D509 Iron deficiency anemia, unspecified: Secondary | ICD-10-CM | POA: Diagnosis not present

## 2014-03-19 DIAGNOSIS — K529 Noninfective gastroenteritis and colitis, unspecified: Secondary | ICD-10-CM

## 2014-03-19 DIAGNOSIS — C7931 Secondary malignant neoplasm of brain: Secondary | ICD-10-CM

## 2014-03-19 DIAGNOSIS — E876 Hypokalemia: Secondary | ICD-10-CM

## 2014-03-19 DIAGNOSIS — C439 Malignant melanoma of skin, unspecified: Secondary | ICD-10-CM

## 2014-03-19 LAB — FECAL OCCULT BLOOD, GUAIAC: Occult Blood: NEGATIVE

## 2014-03-19 MED ORDER — POTASSIUM CHLORIDE ER 20 MEQ PO TBCR: 20.0000 meq | EXTENDED_RELEASE_TABLET | Freq: Two times a day (BID) | ORAL | Status: DC

## 2014-03-19 NOTE — Telephone Encounter (Signed)
Patient wanted tablets of potassium instead of liquid due to cost.  Ok to chane to tabs per Dr. Benay Spice.

## 2014-03-23 DIAGNOSIS — R197 Diarrhea, unspecified: Secondary | ICD-10-CM | POA: Diagnosis not present

## 2014-03-23 DIAGNOSIS — Z6827 Body mass index (BMI) 27.0-27.9, adult: Secondary | ICD-10-CM | POA: Diagnosis not present

## 2014-04-06 ENCOUNTER — Telehealth: Payer: Self-pay | Admitting: Internal Medicine

## 2014-04-06 NOTE — Telephone Encounter (Signed)
New Msg       Pt calling about Surgical clearance for Oral Surgery with Dr. Buelah Manis.    Pt states all necessary info has already been sent but no response has been received.    Please return call.

## 2014-04-06 NOTE — Telephone Encounter (Signed)
Informed patient [name] to review clearance request today.

## 2014-04-08 ENCOUNTER — Encounter: Payer: Self-pay | Admitting: *Deleted

## 2014-04-08 NOTE — Telephone Encounter (Signed)
-----   Message from Deboraha Sprang, MD sent at 04/07/2014  8:58 PM EST ----- IVy  The big issue will be attention to volume: I had thought [facility] was going to take this, but no big deal  Her pacemaker should not be an issue  Thanks steve ----- Message -----    From: Stanton Kidney, RN    Sent: 04/07/2014   6:38 PM      To: Nuala Alpha, LPN, Deboraha Sprang, MD  Dr. Caryl Comes,  [name] needs extensive dental surgery.  She has a tooth cracked secondary to all the steroid treatments.  The dental office would like to know recommendations before surgery. Please advise and let Winifred Olive know recommendations.  She will fax information tomorrow, as I am off.  Thanks

## 2014-04-08 NOTE — Telephone Encounter (Signed)
Contacted Jackie at Dr Kerry Kass [place] office to inform them that per Dr Caryl Comes the pts pacemaker should not be an issue with getting her dental surgery done.  Informed Kennyth Lose that we will fax this information to their office at [phone] Beverley Fiedler today.  Informed Kennyth Lose that attached to the clearance is the pts last OV note.  Informed Kennyth Lose that if she has any additional questions that Dr Olin Pia covering nurse will be back in the office tomorrow.  Kennyth Lose from Dr Janeann Forehand office verbalized understanding and agrees with this plan.  Will route this note to Dr Olin Pia covering nurse to make her aware.

## 2014-04-09 ENCOUNTER — Telehealth: Payer: Self-pay | Admitting: Internal Medicine

## 2014-04-09 NOTE — Telephone Encounter (Signed)
Received request from Nurse fax box, documents faxed for surgical clearance. To: [facility]  Fax number: [phone] Attention: 2.12.16/km

## 2014-04-23 ENCOUNTER — Telehealth: Payer: Self-pay | Admitting: Oncology

## 2014-04-23 NOTE — Telephone Encounter (Signed)
S/w pt confirming updated MD visit, pt is seeing an MD before seeing Dr. Benay Spice which should be in Epic she said by the time she comes back for her MD visit... KJ

## 2014-05-04 DIAGNOSIS — Z6827 Body mass index (BMI) 27.0-27.9, adult: Secondary | ICD-10-CM | POA: Diagnosis not present

## 2014-05-04 DIAGNOSIS — K769 Liver disease, unspecified: Secondary | ICD-10-CM | POA: Diagnosis not present

## 2014-05-04 DIAGNOSIS — C7931 Secondary malignant neoplasm of brain: Secondary | ICD-10-CM | POA: Diagnosis not present

## 2014-05-04 DIAGNOSIS — K5730 Diverticulosis of large intestine without perforation or abscess without bleeding: Secondary | ICD-10-CM | POA: Diagnosis not present

## 2014-05-04 DIAGNOSIS — C719 Malignant neoplasm of brain, unspecified: Secondary | ICD-10-CM | POA: Diagnosis not present

## 2014-05-04 DIAGNOSIS — C439 Malignant melanoma of skin, unspecified: Secondary | ICD-10-CM | POA: Diagnosis not present

## 2014-05-05 DIAGNOSIS — Z6827 Body mass index (BMI) 27.0-27.9, adult: Secondary | ICD-10-CM | POA: Diagnosis not present

## 2014-05-05 DIAGNOSIS — C7931 Secondary malignant neoplasm of brain: Secondary | ICD-10-CM | POA: Diagnosis not present

## 2014-05-06 ENCOUNTER — Ambulatory Visit: Payer: Medicare Other | Admitting: Oncology

## 2014-05-06 ENCOUNTER — Other Ambulatory Visit: Payer: Self-pay | Admitting: Dermatology

## 2014-05-06 DIAGNOSIS — Z85820 Personal history of malignant melanoma of skin: Secondary | ICD-10-CM | POA: Diagnosis not present

## 2014-05-06 DIAGNOSIS — L814 Other melanin hyperpigmentation: Secondary | ICD-10-CM | POA: Diagnosis not present

## 2014-05-06 DIAGNOSIS — L858 Other specified epidermal thickening: Secondary | ICD-10-CM | POA: Diagnosis not present

## 2014-05-06 DIAGNOSIS — D2271 Melanocytic nevi of right lower limb, including hip: Secondary | ICD-10-CM | POA: Diagnosis not present

## 2014-05-06 DIAGNOSIS — L570 Actinic keratosis: Secondary | ICD-10-CM | POA: Diagnosis not present

## 2014-05-06 DIAGNOSIS — Z85828 Personal history of other malignant neoplasm of skin: Secondary | ICD-10-CM | POA: Diagnosis not present

## 2014-05-06 DIAGNOSIS — D485 Neoplasm of uncertain behavior of skin: Secondary | ICD-10-CM | POA: Diagnosis not present

## 2014-05-06 DIAGNOSIS — D2272 Melanocytic nevi of left lower limb, including hip: Secondary | ICD-10-CM | POA: Diagnosis not present

## 2014-05-10 ENCOUNTER — Ambulatory Visit
Admission: RE | Admit: 2014-05-10 | Discharge: 2014-05-10 | Disposition: A | Discharge status: Home / Self Care | Payer: BLUE CROSS/BLUE SHIELD | Source: Ambulatory Visit | Attending: Radiation Oncology | Admitting: Radiation Oncology

## 2014-05-10 ENCOUNTER — Encounter: Payer: Self-pay | Admitting: Radiation Oncology

## 2014-05-10 VITALS — BP 125/94 | HR 84 | Temp 98.0°F | Resp 20 | Ht 63.0 in | Wt 147.9 lb

## 2014-05-10 DIAGNOSIS — C7931 Secondary malignant neoplasm of brain: Secondary | ICD-10-CM

## 2014-05-10 NOTE — Progress Notes (Signed)
Radiation Oncology         ([phone]) [phone] ________________________________  Name: [name] MRN: [id number]  Date: 05/10/2014  DOB: [birth year]  Follow-Up Visit Note  CC: [name], MD  [name], MD  Diagnosis:   Metastatic melanoma with brain metastasis  Interval Since Last Radiation:  One and a half years   Narrative:  The patient returns today for routine follow-up.  She states that she is doing very well overall. She has some fatigue but overall feels well and remains quite active. She is in good spirits today. She denies any worsening difficulties with headaches, nausea. She sees Dr. Benay Spice in medical oncology tomorrow. She continues to be followed at [facility]. She had a recent CT scan of the head. We have this disc and it is getting loaded into our radiology system. I have been able to obtain the report from [facility] through the [place] system as follows:  EXAM: Computed tomography, head or brain with contrast material.  DATE: 05/04/14 12:35:42  ACCESSION: [id number]  DICTATED: 05/04/14 12:53:28  INTERPRETATION LOCATION: [facility]    CLINICAL INDICATION: 59 Year Old (F): C71.9 - Carcinoma of brain (RAF-HCC).MALIG NEOPLASM BRAIN UNSPEC SITE    COMPARISON: CT head 02/02/14.    TECHNIQUE: Axial CT images from skull base to vertex with contrast.    FINDINGS: Postsurgical changes related to left parietal craniotomy and left occipital craniectomy are redemonstrated. There are no enhancing lesions. There is no midline shift or mass lesion.There is no evidence of intracranialhemorrhage or acute infarct.No fractures are evident. The sinuses are pneumatized.      IMPRESSION:  Stable postsurgical changes. No enhancing lesions.   This therefore represents a very good scan. It is a CT scan with the patient having a pacemaker. She saw neurosurgery recently and it was discussed to go ahead and obtain an MRI scan sometime in the  fairly near future. This will need to be coordinated with cardiology as well as we have done before.                              ALLERGIES:  is allergic to doxycycline; erythromycin; penicillins; preparation h; promethazine; sulfonamide derivatives; and phenergan.  Meds: Current Outpatient Prescriptions  Medication Sig Dispense Refill  . acetaminophen (TYLENOL) 500 MG tablet Take 1,000 mg by mouth every 6 (six) hours as needed for pain.    Marland Kitchen albuterol (VENTOLIN HFA) 108 (90 BASE) MCG/ACT inhaler Inhale 2 puffs into the lungs as needed.    . clonazePAM (KLONOPIN) 0.5 MG tablet Take 1 tablet (0.5 mg total) by mouth 3 (three) times daily as needed for anxiety. 50 tablet 0  . cloNIDine (CATAPRES) 0.1 MG tablet TAKE ONE TABLET BY MOUTH AT BEDTIME  90 tablet 3  . cyanocobalamin (,VITAMIN B-12,) 1000 MCG/ML injection INJECT 1 ML UNDER THE SKIN AS DIRECTED ONCE A MONTH 3 mL 0  . diphenhydrAMINE (BENADRYL) 25 MG tablet Take 12.5 mg by mouth every 6 (six) hours as needed. Seasonal allergies    . esomeprazole (NEXIUM) 40 MG capsule 40 mg daily.     . fluconazole (DIFLUCAN) 100 MG tablet Take 100 mg by mouth daily.    . furosemide (LASIX) 40 MG tablet Take 40 mg by mouth daily. Takes PRN    . levETIRAcetam (KEPPRA) 500 MG tablet Take 500 mg by mouth 2 (two) times daily.     . metFORMIN (GLUCOPHAGE) 500 MG tablet  Take 500 mg by mouth daily after breakfast.    . metoprolol tartrate (LOPRESSOR) 25 MG tablet Take 25 mg by mouth 2 (two) times daily.     . mometasone (NASONEX) 50 MCG/ACT nasal spray Place 2 sprays into the nose daily.    . polyethylene glycol powder (GLYCOLAX/MIRALAX) powder Stir 17 grams in 8oz of water, or juice, 1 or 2 times daily for constipation PRN    . ferrous sulfate 325 (65 FE) MG EC tablet Take 1 tablet (325 mg total) by mouth daily with breakfast. (Patient not taking: Reported on 05/10/2014)  3  . hydrocortisone (ANUSOL-HC) 25 MG suppository Place 25 mg rectally at bedtime as needed  for hemorrhoids.    . hydrocortisone 2.5 % cream Apply 1 application topically 2 (two) times daily as needed (irritation).     . mesalamine (CANASA) 1000 MG suppository Place 1,000 mg rectally at bedtime as needed (hemorrhoids).     . ondansetron (ZOFRAN-ODT) 4 MG disintegrating tablet Take 4 mg by mouth as needed. 4mg  ODT q4 hours prn nausea/vomit    . Potassium Chloride ER 20 MEQ TBCR Take 20 mEq by mouth 2 (two) times daily. 60 tablet 1  . Vitamin D, Ergocalciferol, (DRISDOL) 50000 UNITS CAPS capsule Take 50,000 Units by mouth every 7 (seven) days.     No current facility-administered medications for this encounter.    Physical Findings: The patient is in no acute distress. Patient is alert and oriented.  height is 5\' 3"  (1.6 m) and weight is 147 lb 14.4 oz (67.087 kg). Her oral temperature is 98 F (36.7 C). Her blood pressure is 125/94 and her pulse is 84. Her respiration is 20 and oxygen saturation is 100%. .     Lab Findings: Lab Results  Component Value Date   WBC 5.1 03/17/2014   HGB 12.0 03/17/2014   HCT 37.8 03/17/2014   MCV 78.9* 03/17/2014   PLT 291 03/17/2014     Radiographic Findings: No results found.  Impression/plan:    The patient clinically is doing very well. Her CT scan recently did not show any evidence of disease. We discussed obtaining a brain MRI scan in approximately one month. We will coordinate this with radiology and cardiology.   I spent 20 minutes with the patient today, the majority of which was spent counseling the patient on the diagnosis of cancer and coordinating care.   Jodelle Gross, M.D., Ph.D.

## 2014-05-10 NOTE — Progress Notes (Signed)
Follow up s/p rad txs 10/24/12 metastatic melanoma with brain mets, no head aches, gets nauseated  2 hours after taking Diflucan 100  mg daily has 3 more days for thrush in mouth, , appetite so-so still losing weight stated, still fatigued, did have the scans don with Dr. Lenon Curt, stated Mont Dutton has them, 10:30 AM

## 2014-05-11 ENCOUNTER — Ambulatory Visit (HOSPITAL_BASED_OUTPATIENT_CLINIC_OR_DEPARTMENT_OTHER): Payer: Medicare Other | Admitting: Oncology

## 2014-05-11 ENCOUNTER — Telehealth: Payer: Self-pay | Admitting: Oncology

## 2014-05-11 VITALS — BP 127/92 | HR 84 | Temp 98.1°F | Resp 19 | Ht 63.0 in | Wt 148.2 lb

## 2014-05-11 DIAGNOSIS — K769 Liver disease, unspecified: Secondary | ICD-10-CM

## 2014-05-11 DIAGNOSIS — E611 Iron deficiency: Secondary | ICD-10-CM

## 2014-05-11 DIAGNOSIS — C4360 Malignant melanoma of unspecified upper limb, including shoulder: Secondary | ICD-10-CM | POA: Diagnosis not present

## 2014-05-11 DIAGNOSIS — R799 Abnormal finding of blood chemistry, unspecified: Secondary | ICD-10-CM | POA: Diagnosis not present

## 2014-05-11 DIAGNOSIS — Z86718 Personal history of other venous thrombosis and embolism: Secondary | ICD-10-CM

## 2014-05-11 DIAGNOSIS — Z8673 Personal history of transient ischemic attack (TIA), and cerebral infarction without residual deficits: Secondary | ICD-10-CM

## 2014-05-11 DIAGNOSIS — R911 Solitary pulmonary nodule: Secondary | ICD-10-CM

## 2014-05-11 DIAGNOSIS — I10 Essential (primary) hypertension: Secondary | ICD-10-CM

## 2014-05-11 DIAGNOSIS — C7931 Secondary malignant neoplasm of brain: Secondary | ICD-10-CM

## 2014-05-11 DIAGNOSIS — D500 Iron deficiency anemia secondary to blood loss (chronic): Secondary | ICD-10-CM

## 2014-05-11 NOTE — Telephone Encounter (Signed)
Gave avs & calendar for April & June.

## 2014-05-11 NOTE — Progress Notes (Signed)
[place] OFFICE PROGRESS NOTE   Diagnosis: Melanoma  INTERVAL HISTORY:   She returns as scheduled. She feels well. No diarrhea. She recently fractured tooth teeth and acquired extractions. She developed thrush after being placed on antibiotic and is completing a course of Diflucan. She reports nausea when she takes Diflucan.  She underwent restaging CTs at [facility] on 05/04/2014. A brain CT revealed no enhancing lesions and stable postsurgical changes. A CT of the chest revealed no pulmonary nodules. Subcentimeter hypoattenuating liver lesions are unchanged. No new lesions. She is being scheduled for a follow-up brain MRI.  Objective:  Vital signs in last 24 hours:  Blood pressure 127/92, pulse 84, temperature 98.1 F (36.7 C), temperature source Oral, resp. rate 19, height 5' 3"  (1.6 m), weight 148 lb 3.2 oz (67.223 kg), SpO2 100 %.    HEENT: Neck without mass, mild whitecoat over the tongue, no buccal thrush Lymphatics: No cervical, supraclavicular, or axillary nodes Resp: Lungs clear bilaterally Cardio: Regular rate and rhythm GI: No hepatomegaly, nontender Vascular: No leg edema  Skin: Right upper arm scar without evidence of recurrent tumor     Lab Results: 03/17/2014: 1112, MCV 78.9, ferritin-9  Medications: I have reviewed the patient's current medications.  Assessment/Plan: 1.Metastatic melanoma  -Status post resection of a right arm melanoma, stage II A (T2b N0) in December 2009  -Metastatic melanoma, status post resection of a left cerebellar metastasis 09/30/2012, BRAF mutation identified  -Status post SRS treatment of the left cerebellum and a left parietal metastasis 10/24/2012  -Staging CTs and PET scan without evidence of distant metastatic disease aside from indeterminate small lung nodules  -Restaging CTs 12/01/2012 consistent with enlargement of the left parietal mass and right lower lobe lung nodule  -Initiation of dabrafenib and  tremetinib 12/17/2012. She reports extremely poor tolerance. Discontinued.  -Status post resection of a left parietal metastasis 02/09/2013.  -Restaging CT evaluation 03/03/2013. Stable 1.5 cm right lower lobe pulmonary nodule. Multiple peripheral enhancing lesions throughout the liver.  -Initiation of Ipilimumab at [facility] 03/10/2013.  -Cycle 2 Ipilumumab 04/17/2013  -Cycle 3 Ipilumumab 05/14/2013  -Cycle 4 Ipilumumab 06/04/2013  -Restaging CT at [facility] 06/25/2013 with multiple subcentimeter liver lesions, no new lesions, decreased right lower lobe nodule.  -Brain MRI 07/07/2013 with no evidence of progressive metastatic disease.  -Brain CT 10/09/2013 with no evidence of progressive metastatic disease  -Brain CT and CTs of the chest, abdomen, and pelvis at [facility] on 11/03/2013 with no evidence of progressive disease  -CTs of the brain, chest, abdomen, and pelvis at [facility] on 02/02/2014 with no evidence of disease progression -CTs of the brain, chest, abdomen, and pelvis at [facility] on 05/04/2014-no evidence of disease progression 2. Arrhythmia, pacemaker in place.  3. Hypertension  4. History of a CVA  5. History of deep vein thrombosis  6. History of proximal leg weakness-likely related to chronic steroid use.  7. Intermittent rash at the posterior thighs and left hand-likely related to systemic therapy.  8. Leg edema-? Related to liver disease,? Secondary to ipilumumab . Improved on furosemide.  9. Headache and mild nausea-coincided with discontinuation of Decadron, CT of the brain 03/25/2011 with no acute change.  10. 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 History of Mild elevation of lipase-likely mild asymptomatic pancreatitis related to Ipilumumab  12. Red cell microcytosis and iron deficiency 03/17/2014-I will refer her to Dr. Olevia Perches to consider the indication for an endoscopic evaluation. The iron deficiency could be related  to the history  of inflammatory bowel disease or ipilimumab induced enteritis    Disposition:  She remains in clinical remission from melanoma. She will continue CT follow-up at [facility]. She will be scheduled for a brain MRI within the next few months.  I will refer her to Dr. Olevia Perches for evaluation of iron deficiency.  She will return for an office visit after the [facility] restaging studies in June.  Betsy Coder, MD  05/11/2014  4:38 PM

## 2014-05-18 ENCOUNTER — Other Ambulatory Visit: Payer: Self-pay | Admitting: Radiation Therapy

## 2014-05-18 DIAGNOSIS — C7931 Secondary malignant neoplasm of brain: Secondary | ICD-10-CM

## 2014-05-30 ENCOUNTER — Other Ambulatory Visit: Payer: Self-pay | Admitting: Physician Assistant

## 2014-05-30 ENCOUNTER — Other Ambulatory Visit: Payer: Self-pay | Admitting: Internal Medicine

## 2014-05-30 MED ORDER — CYANOCOBALAMIN 1000 MCG/ML IJ SOLN: INTRAMUSCULAR | Status: DC

## 2014-05-30 NOTE — Telephone Encounter (Signed)
Sent Rx for Vit B 12 refill to pharmacy.

## 2014-06-02 ENCOUNTER — Other Ambulatory Visit (HOSPITAL_COMMUNITY): Payer: Medicare Other

## 2014-06-07 ENCOUNTER — Ambulatory Visit: Payer: Self-pay | Admitting: Radiation Oncology

## 2014-06-10 ENCOUNTER — Telehealth: Payer: Self-pay | Admitting: Internal Medicine

## 2014-06-10 NOTE — Telephone Encounter (Signed)
Patient wanted to confirm MRI appt to be on 4/26 at 7pm -- I informed her that this was the correct time. I confirmed with radiology per her request.   I also confirmed that [employer] representative will be there. Patient was thankful for my help with this.

## 2014-06-10 NOTE — Telephone Encounter (Signed)
New Message    Patient has questions in regards to the appt that she has on 06/22/14 @7pm  with Dr. Caryl Comes. Please give patient a call back bc she is confused on the time.

## 2014-06-16 ENCOUNTER — Telehealth: Payer: Self-pay | Admitting: *Deleted

## 2014-06-16 NOTE — Telephone Encounter (Signed)
Patient is scheduled for brain MRI 06/22/14 at 7 pm Dr. Caryl Comes and [employer] rep requested to be present during MRI.  Per Dr. Caryl Comes this will have to be at 6pm, he cannot be there at 7pm.  Per Dr. Clarice Pole office (neuro), MRI was scheduled by [name] at the cancer center. Left message for [name] to call back. [phone].

## 2014-06-17 NOTE — Telephone Encounter (Signed)
Spoke with MRI/radiology who stated ok to move patient to 6pm. Made radiology aware that I would inform [employer] rep of time change. Left detailed message on pt personal voicemail about time change.

## 2014-06-17 NOTE — Telephone Encounter (Signed)
Informed [employer] rep (brent) of MRI rescheduled to 6pm

## 2014-06-19 ENCOUNTER — Other Ambulatory Visit: Payer: Self-pay | Admitting: Internal Medicine

## 2014-06-21 NOTE — Telephone Encounter (Signed)
Called patient and confirmed that she got my message from last week informing her of MRI time moved up to 6pm. She states that she did and she will see Dr. Caryl Comes tomorrow at [place].

## 2014-06-22 ENCOUNTER — Ambulatory Visit (HOSPITAL_COMMUNITY)
Admission: RE | Admit: 2014-06-22 | Discharge: 2014-06-22 | Disposition: A | Discharge status: Home / Self Care | Payer: Medicare Other | Source: Ambulatory Visit | Attending: Radiation Oncology | Admitting: Radiation Oncology

## 2014-06-22 ENCOUNTER — Ambulatory Visit (HOSPITAL_COMMUNITY): Admission: RE | Admit: 2014-06-22 | Payer: Medicare Other | Source: Ambulatory Visit

## 2014-06-22 DIAGNOSIS — C7931 Secondary malignant neoplasm of brain: Secondary | ICD-10-CM | POA: Insufficient documentation

## 2014-06-22 DIAGNOSIS — Z85820 Personal history of malignant melanoma of skin: Secondary | ICD-10-CM | POA: Diagnosis not present

## 2014-06-22 DIAGNOSIS — Z9889 Other specified postprocedural states: Secondary | ICD-10-CM | POA: Diagnosis not present

## 2014-06-22 DIAGNOSIS — Z950 Presence of cardiac pacemaker: Secondary | ICD-10-CM | POA: Insufficient documentation

## 2014-06-22 DIAGNOSIS — Z85841 Personal history of malignant neoplasm of brain: Secondary | ICD-10-CM | POA: Diagnosis not present

## 2014-06-22 LAB — POCT I-STAT CREATININE: Creatinine, Ser: 1 mg/dL (ref 0.50–1.10)

## 2014-06-22 MED ORDER — GADOBENATE DIMEGLUMINE 529 MG/ML IV SOLN
15.0000 mL | Freq: Once | INTRAVENOUS | Status: AC | PRN
  Administered 2014-06-22: 15 mL via INTRAVENOUS

## 2014-06-22 NOTE — Telephone Encounter (Signed)
OK to refill

## 2014-06-23 DIAGNOSIS — Z6826 Body mass index (BMI) 26.0-26.9, adult: Secondary | ICD-10-CM | POA: Diagnosis not present

## 2014-06-24 ENCOUNTER — Encounter: Payer: Self-pay | Admitting: Radiation Therapy

## 2014-06-24 DIAGNOSIS — C7931 Secondary malignant neoplasm of brain: Secondary | ICD-10-CM | POA: Diagnosis not present

## 2014-06-25 ENCOUNTER — Ambulatory Visit: Payer: Medicare Other | Admitting: Internal Medicine

## 2014-07-06 DIAGNOSIS — K5730 Diverticulosis of large intestine without perforation or abscess without bleeding: Secondary | ICD-10-CM | POA: Diagnosis not present

## 2014-07-06 DIAGNOSIS — K589 Irritable bowel syndrome without diarrhea: Secondary | ICD-10-CM | POA: Diagnosis not present

## 2014-07-06 DIAGNOSIS — E559 Vitamin D deficiency, unspecified: Secondary | ICD-10-CM | POA: Diagnosis not present

## 2014-07-06 DIAGNOSIS — K529 Noninfective gastroenteritis and colitis, unspecified: Secondary | ICD-10-CM | POA: Diagnosis not present

## 2014-07-10 ENCOUNTER — Other Ambulatory Visit: Payer: Self-pay | Admitting: Oncology

## 2014-07-14 ENCOUNTER — Telehealth: Payer: Self-pay | Admitting: Internal Medicine

## 2014-07-14 ENCOUNTER — Other Ambulatory Visit: Payer: Self-pay | Admitting: *Deleted

## 2014-07-14 DIAGNOSIS — E876 Hypokalemia: Secondary | ICD-10-CM

## 2014-07-14 MED ORDER — POTASSIUM CHLORIDE ER 20 MEQ PO TBCR: 20.0000 meq | EXTENDED_RELEASE_TABLET | Freq: Two times a day (BID) | ORAL | Status: DC

## 2014-07-14 NOTE — Telephone Encounter (Signed)
New problem   Need to know status of home health plan of care and orders that was sent. Please advise.

## 2014-07-14 NOTE — Telephone Encounter (Signed)
Left message for Jennifer to call back

## 2014-07-19 DIAGNOSIS — C439 Malignant melanoma of skin, unspecified: Secondary | ICD-10-CM | POA: Diagnosis not present

## 2014-07-19 DIAGNOSIS — C799 Secondary malignant neoplasm of unspecified site: Secondary | ICD-10-CM | POA: Diagnosis not present

## 2014-07-19 DIAGNOSIS — Z6827 Body mass index (BMI) 27.0-27.9, adult: Secondary | ICD-10-CM | POA: Diagnosis not present

## 2014-07-19 DIAGNOSIS — Z79899 Other long term (current) drug therapy: Secondary | ICD-10-CM | POA: Diagnosis not present

## 2014-07-19 DIAGNOSIS — C3490 Malignant neoplasm of unspecified part of unspecified bronchus or lung: Secondary | ICD-10-CM | POA: Diagnosis not present

## 2014-07-19 DIAGNOSIS — I10 Essential (primary) hypertension: Secondary | ICD-10-CM | POA: Diagnosis not present

## 2014-07-21 ENCOUNTER — Other Ambulatory Visit: Payer: Self-pay | Admitting: Radiation Oncology

## 2014-07-21 ENCOUNTER — Encounter: Payer: Self-pay | Admitting: Radiation Therapy

## 2014-07-21 ENCOUNTER — Telehealth: Payer: Self-pay | Admitting: *Deleted

## 2014-07-21 ENCOUNTER — Encounter: Payer: Self-pay | Admitting: *Deleted

## 2014-07-21 NOTE — Telephone Encounter (Signed)
Called patient yes she does need nexium rx refill [facility] at [phone], called Pharmacy, for refill nexium: nexium '40mg'$  take 1 capsule by mouth twice daily before meals, dispense 60 capsules with 2 refills per Dr. Loel Ro call for any questions [phone] 3:43 PM

## 2014-07-21 NOTE — Progress Notes (Signed)
[name] called to request a refill for her Nexium. [facility] and they did not get in touch with her before there were no more refills available.   Please and Thank you,  [name]

## 2014-08-02 ENCOUNTER — Telehealth: Payer: Self-pay | Admitting: *Deleted

## 2014-08-02 NOTE — Telephone Encounter (Signed)
Received fax from Nonda Lou, [place] ph# [phone] requesting OV note from 06/01/13 - 09/29/13.  Called her and advised that Dr. Harrington Challenger had not seen the patient in the office during this time period.   She verbalizes understanding, will check with oncology for further assistance since the patient changed to medicare.

## 2014-08-03 DIAGNOSIS — C439 Malignant melanoma of skin, unspecified: Secondary | ICD-10-CM | POA: Diagnosis not present

## 2014-08-03 DIAGNOSIS — R229 Localized swelling, mass and lump, unspecified: Secondary | ICD-10-CM | POA: Diagnosis not present

## 2014-08-03 DIAGNOSIS — G9389 Other specified disorders of brain: Secondary | ICD-10-CM | POA: Diagnosis not present

## 2014-08-03 DIAGNOSIS — K449 Diaphragmatic hernia without obstruction or gangrene: Secondary | ICD-10-CM | POA: Diagnosis not present

## 2014-08-03 DIAGNOSIS — K769 Liver disease, unspecified: Secondary | ICD-10-CM | POA: Diagnosis not present

## 2014-08-04 ENCOUNTER — Encounter: Payer: Self-pay | Admitting: Radiation Therapy

## 2014-08-04 DIAGNOSIS — Z6827 Body mass index (BMI) 27.0-27.9, adult: Secondary | ICD-10-CM | POA: Diagnosis not present

## 2014-08-04 DIAGNOSIS — C7931 Secondary malignant neoplasm of brain: Secondary | ICD-10-CM | POA: Diagnosis not present

## 2014-08-04 NOTE — Progress Notes (Signed)
[name] had restaging scans with Dr. Harriet Masson on Monday 6/6. Per [name], the reports were all great and her visit with Dr. Ellene Route on 6/8 went very well. The plan is to rescan again in 3 months. [name] will bring by her discs so I can get her scans uploaded into PACS on 6/9. [name] will let me know when her next scans are scheduled in [facility] and I will coordinate a visit with Dr. Lisbeth Renshaw afterwards.  Mont Dutton

## 2014-08-06 ENCOUNTER — Telehealth: Payer: Self-pay | Admitting: Internal Medicine

## 2014-08-06 NOTE — Telephone Encounter (Signed)
Lori with [facility] call[facility]ing to see if Dr. Harrington Challenger received forms-7 of them-pls advise

## 2014-08-06 NOTE — Telephone Encounter (Signed)
Called Lori back with [facility]. Informed her the Dr. Harrington Challenger was not in the office today or the first of next week. I did not see her fax, but will let Dr. Alan Ripper nurse know to keep an eye out for it. Cecille Rubin said that one is 27 pages long and another is 6 pages.

## 2014-08-10 ENCOUNTER — Other Ambulatory Visit (HOSPITAL_BASED_OUTPATIENT_CLINIC_OR_DEPARTMENT_OTHER): Payer: Medicare Other

## 2014-08-10 ENCOUNTER — Ambulatory Visit: Payer: Medicare Other | Admitting: Internal Medicine

## 2014-08-10 ENCOUNTER — Telehealth: Payer: Self-pay | Admitting: Oncology

## 2014-08-10 ENCOUNTER — Ambulatory Visit (HOSPITAL_BASED_OUTPATIENT_CLINIC_OR_DEPARTMENT_OTHER): Payer: Medicare Other | Admitting: Oncology

## 2014-08-10 VITALS — BP 131/77 | HR 76 | Temp 97.8°F | Resp 18 | Ht 63.0 in | Wt 150.8 lb

## 2014-08-10 DIAGNOSIS — C439 Malignant melanoma of skin, unspecified: Secondary | ICD-10-CM

## 2014-08-10 DIAGNOSIS — C4360 Malignant melanoma of unspecified upper limb, including shoulder: Secondary | ICD-10-CM

## 2014-08-10 DIAGNOSIS — C7931 Secondary malignant neoplasm of brain: Secondary | ICD-10-CM

## 2014-08-10 DIAGNOSIS — E611 Iron deficiency: Secondary | ICD-10-CM | POA: Diagnosis not present

## 2014-08-10 DIAGNOSIS — D500 Iron deficiency anemia secondary to blood loss (chronic): Secondary | ICD-10-CM

## 2014-08-10 LAB — CBC WITH DIFFERENTIAL/PLATELET
BASO%: 1 % (ref 0.0–2.0)
Basophils Absolute: 0.1 10*3/uL (ref 0.0–0.1)
EOS%: 2 % (ref 0.0–7.0)
Eosinophils Absolute: 0.1 10*3/uL (ref 0.0–0.5)
HCT: 40 % (ref 34.8–46.6)
HGB: 13.1 g/dL (ref 11.6–15.9)
LYMPH%: 35.1 % (ref 14.0–49.7)
MCH: 26 pg (ref 25.1–34.0)
MCHC: 32.8 g/dL (ref 31.5–36.0)
MCV: 79.5 fL (ref 79.5–101.0)
MONO#: 0.6 10*3/uL (ref 0.1–0.9)
MONO%: 10.5 % (ref 0.0–14.0)
NEUT#: 3.1 10*3/uL (ref 1.5–6.5)
NEUT%: 51.4 % (ref 38.4–76.8)
Platelets: 321 10*3/uL (ref 145–400)
RBC: 5.03 10*6/uL (ref 3.70–5.45)
RDW: 13.5 % (ref 11.2–14.5)
WBC: 6 10*3/uL (ref 3.9–10.3)
lymph#: 2.1 10*3/uL (ref 0.9–3.3)

## 2014-08-10 LAB — COMPREHENSIVE METABOLIC PANEL (CC13)
ALT: 27 U/L (ref 0–55)
AST: 26 U/L (ref 5–34)
Albumin: 4.4 g/dL (ref 3.5–5.0)
Alkaline Phosphatase: 201 U/L — ABNORMAL HIGH (ref 40–150)
Anion Gap: 14 mEq/L — ABNORMAL HIGH (ref 3–11)
BUN: 20.2 mg/dL (ref 7.0–26.0)
CO2: 25 mEq/L (ref 22–29)
Calcium: 9.7 mg/dL (ref 8.4–10.4)
Chloride: 103 mEq/L (ref 98–109)
Creatinine: 0.9 mg/dL (ref 0.6–1.1)
EGFR: 74 mL/min/{1.73_m2} — ABNORMAL LOW (ref >90)
Glucose: 102 mg/dl (ref 70–140)
Potassium: 4.7 mEq/L (ref 3.5–5.1)
Sodium: 142 mEq/L (ref 136–145)
Total Bilirubin: 0.31 mg/dL (ref 0.20–1.20)
Total Protein: 7.6 g/dL (ref 6.4–8.3)

## 2014-08-10 LAB — LACTATE DEHYDROGENASE (CC13): LDH: 266 U/L — ABNORMAL HIGH (ref 125–245)

## 2014-08-10 LAB — FERRITIN CHCC: Ferritin: 10 ng/ml (ref 9–269)

## 2014-08-10 NOTE — Telephone Encounter (Signed)
Gave and printed appt sched adn avs for pt for Aug

## 2014-08-10 NOTE — Progress Notes (Signed)
[place] OFFICE PROGRESS NOTE   Diagnosis: Melanoma  INTERVAL HISTORY:   She returns as scheduled. She reports headaches with changes in the barometric pressure. No seizures or focal neurologic symptoms. No diarrhea or abdominal pain. Staging CTs at [facility] 08/03/2014 showed no evidence of disease progression.  Objective:  Vital signs in last 24 hours:  Blood pressure 131/77, pulse 76, temperature 97.8 F (36.6 C), temperature source Oral, resp. rate 18, height 5' 3"  (1.6 m), weight 150 lb 12.8 oz (68.402 kg), SpO2 100 %.    HEENT: Neck without mass Lymphatics: No cervical, supra-clavicular, or axillary nodes Resp: Lungs clear bilaterally Cardio: Regular rate and rhythm GI: No hepatosplenomegaly, nontender, no mass Vascular: No leg edema Neuro: Alert and oriented, the motor exam appears intact in the upper and lower extremities  Skin: Right upper arm scar without evidence of recurrent tumor     Lab Results:  Lab Results  Component Value Date   WBC 6.0 08/10/2014   HGB 13.1 08/10/2014   HCT 40.0 08/10/2014   MCV 79.5 08/10/2014   PLT 321 08/10/2014   NEUTROABS 3.1 08/10/2014   ferritin-10   Medications: I have reviewed the patient's current medications.  Assessment/Plan: 1.Metastatic melanoma  -Status post resection of a right arm melanoma, stage II A (T2b N0) in December 2009  -Metastatic melanoma, status post resection of a left cerebellar metastasis 09/30/2012, BRAF mutation identified  -Status post SRS treatment of the left cerebellum and a left parietal metastasis 10/24/2012  -Staging CTs and PET scan without evidence of distant metastatic disease aside from indeterminate small lung nodules  -Restaging CTs 12/01/2012 consistent with enlargement of the left parietal mass and right lower lobe lung nodule  -Initiation of dabrafenib and tremetinib 12/17/2012. She reports extremely poor tolerance. Discontinued.  -Status post resection of a left  parietal metastasis 02/09/2013.  -Restaging CT evaluation 03/03/2013. Stable 1.5 cm right lower lobe pulmonary nodule. Multiple peripheral enhancing lesions throughout the liver.  -Initiation of Ipilimumab at [facility] 03/10/2013.  -Cycle 2 Ipilumumab 04/17/2013  -Cycle 3 Ipilumumab 05/14/2013  -Cycle 4 Ipilumumab 06/04/2013  -Restaging CT at [facility] 06/25/2013 with multiple subcentimeter liver lesions, no new lesions, decreased right lower lobe nodule.  -Brain MRI 07/07/2013 with no evidence of progressive metastatic disease.  -Brain CT 10/09/2013 with no evidence of progressive metastatic disease  -Brain CT and CTs of the chest, abdomen, and pelvis at [facility] on 11/03/2013 with no evidence of progressive disease  -CTs of the brain, chest, abdomen, and pelvis at [facility] on 02/02/2014 with no evidence of disease progression -CTs of the brain, chest, abdomen, and pelvis at [facility] on 05/04/2014-no evidence of disease progression -CTs of the brain, chest, abdomen, and pelvis at [facility] on 08/03/2014-stable 2. Arrhythmia, pacemaker in place.  3. Hypertension  4. History of a CVA  5. History of deep vein thrombosis  6. History of proximal leg weakness-likely related to chronic steroid use.  7. Intermittent rash at the posterior thighs and left hand-likely related to systemic therapy.  8. Leg edema-? Related to liver disease,? Secondary to ipilumumab . Improved on furosemide.  9. Headache and mild nausea-coincided with discontinuation of Decadron, CT of the brain 03/25/2011 with no acute change.  10. 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 History of Mild elevation of lipase-likely mild asymptomatic pancreatitis related to Ipilumumab  12. Red cell microcytosis and iron deficiency 03/17/2014-persistent borderline low iron stores and Red cell microcytosis, she is followed by Dr. Olevia Perches  Disposition:  She remains included remission from the metastatic melanoma.  [name] will continue clinical and x-ray follow-up at [facility]. She will return for an office visit here in approximately 2 months.  Betsy Coder, MD  08/10/2014  5:24 PM

## 2014-08-10 NOTE — Telephone Encounter (Signed)
Papers to be signed are on Dr. Alan Ripper desk. L[facility] from [facility] is aware that Dr. Harrington Challenger does not return to clinic until next Thursday. Advised papers will be signed at that time, and faxed back to [place] at that time. Lori verbalizes understanding and agreement with this.

## 2014-08-11 ENCOUNTER — Inpatient Hospital Stay
Admission: RE | Admit: 2014-08-11 | Discharge: 2014-08-11 | Disposition: A | Discharge status: Home / Self Care | Payer: Self-pay | Source: Ambulatory Visit | Attending: Radiation Oncology | Admitting: Radiation Oncology

## 2014-08-11 ENCOUNTER — Other Ambulatory Visit: Payer: Self-pay | Admitting: Radiation Oncology

## 2014-08-11 ENCOUNTER — Other Ambulatory Visit: Payer: Self-pay | Admitting: Internal Medicine

## 2014-08-11 DIAGNOSIS — C801 Malignant (primary) neoplasm, unspecified: Secondary | ICD-10-CM

## 2014-08-11 DIAGNOSIS — C4490 Unspecified malignant neoplasm of skin, unspecified: Secondary | ICD-10-CM

## 2014-08-15 ENCOUNTER — Other Ambulatory Visit: Payer: Self-pay | Admitting: Oncology

## 2014-08-15 DIAGNOSIS — N390 Urinary tract infection, site not specified: Secondary | ICD-10-CM | POA: Diagnosis not present

## 2014-08-15 MED ORDER — NITROFURANTOIN MONOHYD MACRO 100 MG PO CAPS: 100.0000 mg | ORAL_CAPSULE | Freq: Two times a day (BID) | ORAL | Status: DC

## 2014-08-15 NOTE — Progress Notes (Unsigned)
Called by [facility] re pt's c/o burning w urination. U/a, C&S sent; nitrofurantoin script routed to pharmacy

## 2014-08-16 ENCOUNTER — Telehealth: Payer: Self-pay | Admitting: *Deleted

## 2014-08-16 NOTE — Telephone Encounter (Signed)
Pt reports she had s/s of UTI and HHRN ordered stat UA over the weekend. Macrobid was called in by Dr. Jana Hakim who was on call. Pt calling to see if "it is okay with Dr. Benay Spice that I take this medication while on Immunotherapy". Per Dr. Benay Spice, okay to take as prescribed. Pt voices understanding to take as directed and call us with any worsening symptoms or concerns.

## 2014-08-26 ENCOUNTER — Telehealth: Payer: Self-pay | Admitting: *Deleted

## 2014-08-26 NOTE — Telephone Encounter (Signed)
Per Dr. Benay Spice; left voice message on cell# re: Dr. Gearldine Shown office notified you did not show for appt with Dr. Olevia Perches.  Encouraged pt to call Dr. Olevia Perches office to schedule appt for evaluation of iron deficiency.  Any question, please call office.

## 2014-08-26 NOTE — Telephone Encounter (Signed)
-----   Message from [name], MD sent at 08/20/2014  5:22 PM EDT ----- Please ask her to schedule an appt. With Dr. Olevia Perches to evaluate iron deficiency ----- Message -----    From: Lafayette Dragon, MD    Sent: 08/10/2014  10:03 PM      To: [name], MD  Leroy Sea, Mrs Marrurco did not show for her appointment for iron deficiency anemia.

## 2014-08-26 NOTE — Telephone Encounter (Signed)
Pt returned call; had a question re: possible UTI last weekend 08/15/14 and wanted to know what the culture results were; sample taken by [place].  Per Dr. Benay Spice; office has not received UA results; option given for pt to come into [facility] for lab appt to re-check.  Pt states "I did not start Macrobid because I was waiting on the results; I'm not experiencing any s/s UTI now and will just observe for now; every time I'm on an antibiotic it's an automatic yeast infection for me."  Instructed pt to call office if any further s/s.  Pt verbalized understanding and Dr. Benay Spice made aware.

## 2014-08-31 ENCOUNTER — Telehealth: Payer: Self-pay | Admitting: *Deleted

## 2014-08-31 NOTE — Telephone Encounter (Signed)
Received call from [facility] requesting a verbal order for another 60 days recert for pt to continue nursing services at home :  Medication management, neuro assessment, mental status assessment, and hydration therapy as needed. [facility]   Phone    [phone].

## 2014-09-01 DIAGNOSIS — H04123 Dry eye syndrome of bilateral lacrimal glands: Secondary | ICD-10-CM | POA: Diagnosis not present

## 2014-09-01 NOTE — Telephone Encounter (Signed)
Per Dr. Benay Spice; notified [name] @ [facility] pt is off treatment and does not require [facility] services to be renewed.  [name] verbalized understanding and states "she may not like this"  [name] confirmed that [facility] has not provided "any IV hydration in a long while; basically just doing assessment and helping with meds sometimes"  [name] informed if pt has any questions; she can call office.  [name] verbalized understanding.

## 2014-09-02 ENCOUNTER — Telehealth: Payer: Self-pay | Admitting: *Deleted

## 2014-09-02 ENCOUNTER — Telehealth: Payer: Self-pay | Admitting: Internal Medicine

## 2014-09-02 MED ORDER — FLUCONAZOLE 100 MG PO TABS: 100.0000 mg | ORAL_TABLET | Freq: Every day | ORAL | Status: DC

## 2014-09-02 NOTE — Telephone Encounter (Signed)
PT. GIVEN RESULTS OF 08/18/14 URINE CULTURE. SHE DID NOT START THE MACROBID PRESCRIBED BY DR.MAGRINAT ON 08/15/14. PT.'S SYMPTOMS HAVE RETURNED. SHE WILL START THE MACROBID NOW. PT.STATES SHE WILL ALSO NEED DIFLUCAN BECAUSE ANTIBIOTICS ALWAYS CAUSE THRUSH AND A VAGINAL YEAST INFECTION. DOES DR.SHERRILL WANT A REPEAT URINE AFTER PT. FINISHES HER ANTIBIOTIC? VERBAL ORDER AND READ BACK TO DR.SHERRILL- MAY CALL IN DIFLUCAN FOR PT. SEE MEDICATION LIST. PT. MAY BRING IN A URINE SPECIMEN IN A WEEK OR IF SHE IS HAVING SYMPTOMS. NOTIFIED PT. OF THE ABOVE ORDERS. PT. WILL CALL IF URINARY TRACT INFECTION SYMPTOMS REOCCUR AFTER SHE FINISHES HER ANTIBIOTIC. FAXED THE 08/15/14 URINALYSIS AND 08/18/14 URINE CULTURE REPORTS TO PT.'S HOME.

## 2014-09-02 NOTE — Telephone Encounter (Signed)
Discussed recertification with Dr. Benay Spice to verify that we cannot do recert for [facility]. Called pt back and let her know that MD who initially orders her [facility] and does the recertifications would need to perform recert. Pt voiced understanding and has called her cardiologist, Dr. Harrington Challenger, to provide orders. Knows to call us for anything related to her cancer management.

## 2014-09-02 NOTE — Telephone Encounter (Signed)
Spoke with patient and she advised me that Dr.Sherrill would not sign her prn hydration orders because this is not related to her cancer diagnosis. Advised patient that I will ask Dr.Ross to sign the orders since this is related to her autonomic dysfunction. Discussed above with Dr.Ross and she agreed to sign these home care orders. I called [facility] ( the patient's home health nurse) at [phone] and she is aware that Dr.Ross will sign the 485 form for the hydration orders and then fax back to [place].

## 2014-09-02 NOTE — Telephone Encounter (Signed)
New message      Calling to get recertification for [facility].  [facility] comes out weekly.  Pt dehydrates quickly. Please call

## 2014-09-02 NOTE — Telephone Encounter (Signed)
Patient would like for MD Sherrill to call her concerning [facility] recertification. Message sent to MD Sherrill/RN Ubaldo Glassing.

## 2014-09-07 DIAGNOSIS — F411 Generalized anxiety disorder: Secondary | ICD-10-CM | POA: Diagnosis not present

## 2014-09-07 DIAGNOSIS — I10 Essential (primary) hypertension: Secondary | ICD-10-CM | POA: Diagnosis not present

## 2014-09-07 DIAGNOSIS — C439 Malignant melanoma of skin, unspecified: Secondary | ICD-10-CM | POA: Diagnosis not present

## 2014-09-07 DIAGNOSIS — C7949 Secondary malignant neoplasm of other parts of nervous system: Secondary | ICD-10-CM | POA: Diagnosis not present

## 2014-09-07 DIAGNOSIS — Z6827 Body mass index (BMI) 27.0-27.9, adult: Secondary | ICD-10-CM | POA: Diagnosis not present

## 2014-09-07 DIAGNOSIS — C7931 Secondary malignant neoplasm of brain: Secondary | ICD-10-CM | POA: Diagnosis not present

## 2014-09-07 DIAGNOSIS — I495 Sick sinus syndrome: Secondary | ICD-10-CM | POA: Diagnosis not present

## 2014-09-07 DIAGNOSIS — Z950 Presence of cardiac pacemaker: Secondary | ICD-10-CM | POA: Diagnosis not present

## 2014-09-16 ENCOUNTER — Telehealth: Payer: Self-pay | Admitting: *Deleted

## 2014-09-16 ENCOUNTER — Other Ambulatory Visit: Payer: Self-pay | Admitting: *Deleted

## 2014-09-16 DIAGNOSIS — N390 Urinary tract infection, site not specified: Secondary | ICD-10-CM

## 2014-09-16 NOTE — Telephone Encounter (Signed)
Pt reports having UTI symptoms; was on Macrobid a couple weeks ago and feels UTI has returned. UA to be ordered per Dr. Benay Spice; pt states she is unable to come today but can come tomorrow at 10:30. POF sent to scheduler. Will call pt if anything is ordered today, pt voices understanding of this plan. Dr. Benay Spice is aware of situation.

## 2014-09-17 ENCOUNTER — Ambulatory Visit (HOSPITAL_BASED_OUTPATIENT_CLINIC_OR_DEPARTMENT_OTHER): Payer: Medicare Other

## 2014-09-17 ENCOUNTER — Telehealth: Payer: Self-pay | Admitting: *Deleted

## 2014-09-17 DIAGNOSIS — N390 Urinary tract infection, site not specified: Secondary | ICD-10-CM

## 2014-09-17 LAB — URINALYSIS, MICROSCOPIC - CHCC
Bilirubin (Urine): NEGATIVE
Blood: NEGATIVE
Glucose: NEGATIVE mg/dL
Ketones: NEGATIVE mg/dL
Nitrite: NEGATIVE
Protein: NEGATIVE mg/dL
RBC / HPF: NEGATIVE (ref 0–2)
Specific Gravity, Urine: 1.005 (ref 1.003–1.035)
Urobilinogen, UR: 0.2 mg/dL (ref 0.2–1)
WBC, UA: NEGATIVE (ref 0–2)
pH: 6.5 (ref 4.6–8.0)

## 2014-09-17 NOTE — Telephone Encounter (Signed)
PT. WOULD LIKE A CALL WITH THE PRELIMINARY RESULTS OF HER URINE SPECIMEN GIVEN THIS MORNING.

## 2014-09-17 NOTE — Telephone Encounter (Signed)
UA negative; discussed w/ Dr. Benay Spice. Made pt aware. Voices understanding that we will call her if culture comes back positive.

## 2014-09-17 NOTE — Telephone Encounter (Signed)
Opened in error

## 2014-09-18 LAB — URINE CULTURE

## 2014-09-21 ENCOUNTER — Telehealth: Payer: Self-pay | Admitting: *Deleted

## 2014-09-21 ENCOUNTER — Encounter: Payer: Self-pay | Admitting: *Deleted

## 2014-09-21 NOTE — Telephone Encounter (Signed)
Per Dr. Benay Spice; notified pt that urine culture is negative.  Pt verbalized understanding and confirmed apt on 10/12/14.

## 2014-09-21 NOTE — Telephone Encounter (Signed)
-----   Message from [name], MD sent at 09/18/2014  7:24 PM EDT ----- Please call patient, urine culture is negative

## 2014-10-04 ENCOUNTER — Ambulatory Visit (HOSPITAL_BASED_OUTPATIENT_CLINIC_OR_DEPARTMENT_OTHER): Payer: Medicare Other

## 2014-10-04 ENCOUNTER — Other Ambulatory Visit: Payer: Self-pay | Admitting: *Deleted

## 2014-10-04 DIAGNOSIS — C7931 Secondary malignant neoplasm of brain: Secondary | ICD-10-CM

## 2014-10-04 DIAGNOSIS — C439 Malignant melanoma of skin, unspecified: Secondary | ICD-10-CM

## 2014-10-04 DIAGNOSIS — N390 Urinary tract infection, site not specified: Secondary | ICD-10-CM | POA: Diagnosis present

## 2014-10-04 LAB — URINALYSIS, MICROSCOPIC - CHCC
Bilirubin (Urine): NEGATIVE
Blood: NEGATIVE
Glucose: NEGATIVE mg/dL
Ketones: NEGATIVE mg/dL
Nitrite: NEGATIVE
Protein: NEGATIVE mg/dL
RBC / HPF: NEGATIVE (ref 0–2)
Specific Gravity, Urine: 1.01 (ref 1.003–1.035)
Urobilinogen, UR: 0.2 mg/dL (ref 0.2–1)
WBC, UA: NEGATIVE (ref 0–2)
pH: 7.5 (ref 4.6–8.0)

## 2014-10-12 ENCOUNTER — Ambulatory Visit (HOSPITAL_BASED_OUTPATIENT_CLINIC_OR_DEPARTMENT_OTHER): Payer: Medicare Other | Admitting: Oncology

## 2014-10-12 ENCOUNTER — Other Ambulatory Visit (HOSPITAL_BASED_OUTPATIENT_CLINIC_OR_DEPARTMENT_OTHER): Payer: Medicare Other

## 2014-10-12 ENCOUNTER — Telehealth: Payer: Self-pay | Admitting: Oncology

## 2014-10-12 VITALS — BP 122/84 | HR 76 | Temp 97.8°F | Resp 18 | Ht 63.0 in | Wt 152.1 lb

## 2014-10-12 DIAGNOSIS — C4360 Malignant melanoma of unspecified upper limb, including shoulder: Secondary | ICD-10-CM

## 2014-10-12 DIAGNOSIS — G609 Hereditary and idiopathic neuropathy, unspecified: Secondary | ICD-10-CM

## 2014-10-12 DIAGNOSIS — C7931 Secondary malignant neoplasm of brain: Secondary | ICD-10-CM

## 2014-10-12 DIAGNOSIS — C439 Malignant melanoma of skin, unspecified: Secondary | ICD-10-CM

## 2014-10-12 DIAGNOSIS — E611 Iron deficiency: Secondary | ICD-10-CM | POA: Diagnosis not present

## 2014-10-12 DIAGNOSIS — R300 Dysuria: Secondary | ICD-10-CM | POA: Diagnosis not present

## 2014-10-12 LAB — CBC WITH DIFFERENTIAL/PLATELET
BASO%: 1.1 % (ref 0.0–2.0)
Basophils Absolute: 0.1 10*3/uL (ref 0.0–0.1)
EOS%: 1.8 % (ref 0.0–7.0)
Eosinophils Absolute: 0.1 10*3/uL (ref 0.0–0.5)
HCT: 38.5 % (ref 34.8–46.6)
HGB: 12.6 g/dL (ref 11.6–15.9)
LYMPH%: 32.3 % (ref 14.0–49.7)
MCH: 25 pg — ABNORMAL LOW (ref 25.1–34.0)
MCHC: 32.7 g/dL (ref 31.5–36.0)
MCV: 76.5 fL — ABNORMAL LOW (ref 79.5–101.0)
MONO#: 0.6 10*3/uL (ref 0.1–0.9)
MONO%: 11.7 % (ref 0.0–14.0)
NEUT#: 2.9 10*3/uL (ref 1.5–6.5)
NEUT%: 53.1 % (ref 38.4–76.8)
Platelets: 274 10*3/uL (ref 145–400)
RBC: 5.03 10*6/uL (ref 3.70–5.45)
RDW: 14.4 % (ref 11.2–14.5)
WBC: 5.5 10*3/uL (ref 3.9–10.3)
lymph#: 1.8 10*3/uL (ref 0.9–3.3)

## 2014-10-12 LAB — COMPREHENSIVE METABOLIC PANEL (CC13)
ALT: 26 U/L (ref 0–55)
AST: 21 U/L (ref 5–34)
Albumin: 4.3 g/dL (ref 3.5–5.0)
Alkaline Phosphatase: 193 U/L — ABNORMAL HIGH (ref 40–150)
Anion Gap: 10 mEq/L (ref 3–11)
BUN: 15.7 mg/dL (ref 7.0–26.0)
CO2: 27 mEq/L (ref 22–29)
Calcium: 9.4 mg/dL (ref 8.4–10.4)
Chloride: 104 mEq/L (ref 98–109)
Creatinine: 0.8 mg/dL (ref 0.6–1.1)
EGFR: 77 mL/min/{1.73_m2} — ABNORMAL LOW (ref >90)
Glucose: 90 mg/dl (ref 70–140)
Potassium: 4 mEq/L (ref 3.5–5.1)
Sodium: 141 mEq/L (ref 136–145)
Total Bilirubin: 0.34 mg/dL (ref 0.20–1.20)
Total Protein: 7.7 g/dL (ref 6.4–8.3)

## 2014-10-12 LAB — LACTATE DEHYDROGENASE (CC13): LDH: 256 U/L — ABNORMAL HIGH (ref 125–245)

## 2014-10-12 NOTE — Progress Notes (Signed)
[facility] OFFICE PROGRESS NOTE   Diagnosis: Melanoma  INTERVAL HISTORY:   She returns as scheduled. She has enrolled in an integrative medicine program to work on "neuropathy "and leg strength. No seizures. She has dysuria. Urinalysis samples were negative on 09/17/2014 and 10/04/2014. A urine culture was negative 09/17/2014. No bleeding. She is not taking iron.  Objective:  Vital signs in last 24 hours:  Blood pressure 122/84, pulse 76, temperature 97.8 F (36.6 C), temperature source Oral, resp. rate 18, height 5' 3" (1.6 m), weight 152 lb 1.6 oz (68.992 kg), SpO2 100 %.    HEENT: Neck without mass Lymphatics: No cervical, supraclavicular, axillary, or inguinal nodes Resp: Lungs clear bilaterally Cardio: Regular rate and rhythm GI: No hepatomegaly, nontender, no mass Vascular: No leg edema Neuro: The motor exam appears intact in the legs and feet bilaterally  Skin: Right upper arm scar without evidence of recurrent tumor    Lab Results:  Lab Results  Component Value Date   WBC 5.5 10/12/2014   HGB 12.6 10/12/2014   HCT 38.5 10/12/2014   MCV 76.5* 10/12/2014   PLT 274 10/12/2014   NEUTROABS 2.9 10/12/2014    08/10/2014-ferritin: 10   Imaging:  No results found.  Medications: I have reviewed the patient's current medications.  Assessment/Plan: 1.Metastatic melanoma  -Status post resection of a right arm melanoma, stage II A (T2b N0) in December 2009  -Metastatic melanoma, status post resection of a left cerebellar metastasis 09/30/2012, BRAF mutation identified  -Status post SRS treatment of the left cerebellum and a left parietal metastasis 10/24/2012  -Staging CTs and PET scan without evidence of distant metastatic disease aside from indeterminate small lung nodules  -Restaging CTs 12/01/2012 consistent with enlargement of the left parietal mass and right lower lobe lung nodule  -Initiation of dabrafenib and tremetinib 12/17/2012. She  reports extremely poor tolerance. Discontinued.  -Status post resection of a left parietal metastasis 02/09/2013.  -Restaging CT evaluation 03/03/2013. Stable 1.5 cm right lower lobe pulmonary nodule. Multiple peripheral enhancing lesions throughout the liver.  -Initiation of Ipilimumab at [facility] 03/10/2013.  -Cycle 2 Ipilumumab 04/17/2013  -Cycle 3 Ipilumumab 05/14/2013  -Cycle 4 Ipilumumab 06/04/2013  -Restaging CT at [facility] 06/25/2013 with multiple subcentimeter liver lesions, no new lesions, decreased right lower lobe nodule.  -Brain MRI 07/07/2013 with no evidence of progressive metastatic disease.  -Brain CT 10/09/2013 with no evidence of progressive metastatic disease  -Brain CT and CTs of the chest, abdomen, and pelvis at [facility] on 11/03/2013 with no evidence of progressive disease  -CTs of the brain, chest, abdomen, and pelvis at [facility] on 02/02/2014 with no evidence of disease progression -CTs of the brain, chest, abdomen, and pelvis at [facility] on 05/04/2014-no evidence of disease progression -CTs of the brain, chest, abdomen, and pelvis at [facility] on 08/03/2014-stable 2. Arrhythmia, pacemaker in place.  3. Hypertension  4. History of a CVA  5. History of deep vein thrombosis  6. History of proximal leg weakness-likely related to chronic steroid use.  7. Intermittent rash at the posterior thighs and left hand-likely related to systemic therapy.  8. Leg edema-? Related to liver disease,? Secondary to ipilumumab . Improved on furosemide.  9. Headache and mild nausea-coincided with discontinuation of Decadron, CT of the brain 03/25/2011 with no acute change.  10. 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 History of Mild elevation of lipase-likely mild asymptomatic pancreatitis related to Ipilumumab  12. Red cell microcytosis and iron deficiency  03/17/2014-persistent borderline low iron stores and Red cell microcytosis, she did not see Dr.  Olevia Perches 13. Dysuria-negative urinalyses and urine culture, recommended she follow-up with her GYN  Disposition:  [name] remains in clinical remission from melanoma. She is scheduled for an office visit and her restaging CTs at [facility] within the next few weeks. She has seen Dr. Ellender Hose at [facility] in the past. I recommended she schedule an appointment with Dr. Ellender Hose to address the iron deficiency anemia. She will begin ferrous femur 8 once daily.  AmandaOldenburg will return for an office visit toward the end of September. She will return another set of stool heme occult cards.  Betsy Coder, MD  10/12/2014  12:16 PM

## 2014-10-12 NOTE — Telephone Encounter (Signed)
per pof to sch pt appt 9/27-sent Dr Benay Spice an [place] o adv no openings-adv pt would call once reply

## 2014-10-13 ENCOUNTER — Telehealth: Payer: Self-pay | Admitting: Oncology

## 2014-10-13 ENCOUNTER — Other Ambulatory Visit: Payer: Self-pay | Admitting: Radiation Oncology

## 2014-10-13 MED ORDER — ESOMEPRAZOLE MAGNESIUM 40 MG PO CPDR: 40.0000 mg | DELAYED_RELEASE_CAPSULE | Freq: Two times a day (BID) | ORAL | Status: DC

## 2014-10-13 NOTE — Addendum Note (Signed)
Addended by: Kyung Rudd on: 10/13/2014 07:01 AM   Modules accepted: Orders

## 2014-10-13 NOTE — Telephone Encounter (Signed)
per reply from Dr Benay Spice to sch on 9/28-cld pt and adv of time & date-pt understood

## 2014-10-15 ENCOUNTER — Telehealth: Payer: Self-pay | Admitting: Internal Medicine

## 2014-10-15 NOTE — Telephone Encounter (Signed)
Susie from [facility] called  Requesting  the last pt's office visit for continuing of care on pt. Md wants to have information about the pt's history of Dysautonomia. Last office visit with Dr. Harrington Challenger and Dr. Caryl Comes was faxed to [phone].

## 2014-10-15 NOTE — Telephone Encounter (Signed)
[name] with [facility] hospital calling re patient needing a procedure, pt insisting she has to be admitted due to an autonomic dysfunction, [name] looked through epic and didn't see info to support this-wonders if any info from prior to epic, pls call [phone]

## 2014-10-20 ENCOUNTER — Telehealth: Payer: Self-pay | Admitting: Internal Medicine

## 2014-10-20 NOTE — Telephone Encounter (Signed)
Per Dr. Harrington Challenger:  She has gotten IV fluids at home  I would recomm l liter of IV fluid before / during procedure Follow BP   If [facility] providers would like to speak with Dr. Harrington Challenger okay to share her cell phone number.

## 2014-10-20 NOTE — Telephone Encounter (Signed)
Pt called as she is scheduled to have a colonoscopy on 9/1 at [facility] under the care of Dr. Minette Brine (GI) and  Dr. Sylvan Cheese (oncology), being admitted on 8/29 for pre-hydration.  Pt stated that previously she was admitted for pre-hydration and her [facility] providers would like "orders" from Dr. Harrington Challenger or Dr. Caryl Comes on adequate pre-hydration for the pt. Pt told that our providers cannot provider orders in a facility they are not authorized to work in but her [facility] providers can speak with Dr. Harrington Challenger or Dr. Caryl Comes to get suggestions on how to manage her care.  Pt provided the Nurse Navigators information: Coral Ceo [phone]. Called and left message for Nevin Bloodgood to get details on questions their providers may have for our providers.  Message sent to Dr. Harrington Challenger in the meantime, as Dr. Caryl Comes is out of the office until 8/29.

## 2014-10-20 NOTE — Telephone Encounter (Signed)
Received return call from [facility].  Shared with her Dr. Harrington Challenger pre-hydration recommendations.  She stated that pt has been a great historian and is admitted that she needs to be IP overnight for hydration.  Provided [place] with Dr. Alan Ripper cell number so Dr. Harriet Masson can call her.  She also clarified that pt will be admitted on 8/31 for colonoscopy scheduled for 9/1.

## 2014-10-20 NOTE — Telephone Encounter (Signed)
New message    Patient calling has a procedure schedule at [facility] on 8.31.2016  Need an order so that [facility] can get it precret for inpatient colonoscopy.   Because of her history. IV has to be continues run during the prep.

## 2014-10-21 ENCOUNTER — Telehealth: Payer: Self-pay

## 2014-10-21 NOTE — Telephone Encounter (Signed)
Per Dr. Harrington Challenger, [facility] RN contacted to let her know about pt needing a liter of fluid on 8/31. From 08/2014 pt has standing orders for Liter of fluid, PRN. Spoke with, Cedric Fishman ( the patient's home health nurse) at [phone], she is aware and going to makes nurse visit pt on 8/31 to administer fluids. Contacted pt and is updated her that Gilmer will be there on Wed. To administer fluids.  Pt very appreciative and no additional questions at this time.

## 2014-11-02 ENCOUNTER — Telehealth: Payer: Self-pay | Admitting: *Deleted

## 2014-11-02 DIAGNOSIS — R718 Other abnormality of red blood cells: Secondary | ICD-10-CM | POA: Diagnosis not present

## 2014-11-02 DIAGNOSIS — Z79899 Other long term (current) drug therapy: Secondary | ICD-10-CM | POA: Diagnosis not present

## 2014-11-02 DIAGNOSIS — Z6827 Body mass index (BMI) 27.0-27.9, adult: Secondary | ICD-10-CM | POA: Diagnosis not present

## 2014-11-02 DIAGNOSIS — Z950 Presence of cardiac pacemaker: Secondary | ICD-10-CM | POA: Diagnosis not present

## 2014-11-02 DIAGNOSIS — K5730 Diverticulosis of large intestine without perforation or abscess without bleeding: Secondary | ICD-10-CM | POA: Diagnosis not present

## 2014-11-02 DIAGNOSIS — K769 Liver disease, unspecified: Secondary | ICD-10-CM | POA: Diagnosis not present

## 2014-11-02 DIAGNOSIS — I10 Essential (primary) hypertension: Secondary | ICD-10-CM | POA: Diagnosis not present

## 2014-11-02 DIAGNOSIS — F411 Generalized anxiety disorder: Secondary | ICD-10-CM | POA: Diagnosis not present

## 2014-11-02 DIAGNOSIS — K449 Diaphragmatic hernia without obstruction or gangrene: Secondary | ICD-10-CM | POA: Diagnosis not present

## 2014-11-02 DIAGNOSIS — I669 Occlusion and stenosis of unspecified cerebral artery: Secondary | ICD-10-CM | POA: Diagnosis not present

## 2014-11-02 DIAGNOSIS — C7931 Secondary malignant neoplasm of brain: Secondary | ICD-10-CM | POA: Diagnosis not present

## 2014-11-02 DIAGNOSIS — C439 Malignant melanoma of skin, unspecified: Secondary | ICD-10-CM | POA: Diagnosis not present

## 2014-11-02 DIAGNOSIS — C4361 Malignant melanoma of right upper limb, including shoulder: Secondary | ICD-10-CM | POA: Diagnosis not present

## 2014-11-02 NOTE — Telephone Encounter (Signed)
Patient called to advise that her order for IV hydration has expired and needs a new order from [place]. Advised patient that I would call Autumn, her [facility] nurse to update the prn hydration orders. Left message for Autumn at [phone] to call me back concerning these orders.

## 2014-11-02 NOTE — Telephone Encounter (Signed)
Autumn ( home health nurse) from [facility] called back and advised that the hydration orders are only good for care for 60 days. Last set expired on 8/27. Advised to renew as a verbal order and then to fax to [name] and Dr.Ross can sign these on 9/8 when she is back in the office. Left message for patient with above information. Will forward message to Dr.Ross and Rodman Key RN for follow up.

## 2014-11-05 ENCOUNTER — Other Ambulatory Visit: Payer: Self-pay | Admitting: Internal Medicine

## 2014-11-08 NOTE — Telephone Encounter (Signed)
OK to fill

## 2014-11-10 DIAGNOSIS — Z6826 Body mass index (BMI) 26.0-26.9, adult: Secondary | ICD-10-CM | POA: Diagnosis not present

## 2014-11-10 DIAGNOSIS — C7931 Secondary malignant neoplasm of brain: Secondary | ICD-10-CM | POA: Diagnosis not present

## 2014-11-11 ENCOUNTER — Other Ambulatory Visit: Payer: Self-pay | Admitting: Radiation Oncology

## 2014-11-11 ENCOUNTER — Other Ambulatory Visit (HOSPITAL_BASED_OUTPATIENT_CLINIC_OR_DEPARTMENT_OTHER): Payer: Medicare Other

## 2014-11-11 ENCOUNTER — Other Ambulatory Visit: Payer: Self-pay | Admitting: *Deleted

## 2014-11-11 ENCOUNTER — Inpatient Hospital Stay
Admission: RE | Admit: 2014-11-11 | Discharge: 2014-11-11 | Disposition: A | Discharge status: Home / Self Care | Payer: Self-pay | Source: Ambulatory Visit | Attending: Radiation Oncology | Admitting: Radiation Oncology

## 2014-11-11 ENCOUNTER — Telehealth: Payer: Self-pay | Admitting: *Deleted

## 2014-11-11 DIAGNOSIS — E611 Iron deficiency: Secondary | ICD-10-CM

## 2014-11-11 DIAGNOSIS — C4490 Unspecified malignant neoplasm of skin, unspecified: Secondary | ICD-10-CM

## 2014-11-11 DIAGNOSIS — C439 Malignant melanoma of skin, unspecified: Secondary | ICD-10-CM

## 2014-11-11 DIAGNOSIS — D509 Iron deficiency anemia, unspecified: Secondary | ICD-10-CM

## 2014-11-11 DIAGNOSIS — C7931 Secondary malignant neoplasm of brain: Secondary | ICD-10-CM

## 2014-11-11 LAB — IRON AND TIBC CHCC
%SAT: 59 % — ABNORMAL HIGH (ref 21–57)
Iron: 261 ug/dL — ABNORMAL HIGH (ref 41–142)
TIBC: 440 ug/dL (ref 236–444)
UIBC: 179 ug/dL (ref 120–384)

## 2014-11-11 LAB — FERRITIN CHCC: Ferritin: 24 ng/ml (ref 9–269)

## 2014-11-11 NOTE — Telephone Encounter (Signed)
Called pt back re: message left 11/10/14.  Pt reports "I never got a call back with results of my stool cards I dropped off the day after I saw Dr. Benay Spice"  Pt saw MD 8/16; informed pt that according to documentation in computer--no stool cards have been processed by [facility] lab since 02/2014.  Pt adamant that she dropped cards off day after she saw Dr. Benay Spice.  Informed pt that office will check with lab re: stool card.  Pt also requesting to get "Iron Panel done today"  Pt states she has talked with her [facility] MD Dr. Ellender Hose and he said she needs panel draw as soon as possible.  Pt also went into great detail re: triage process and calling office; and reports calling on- call triage services after 4:30 and  "they were so rude to me"  Informed pt that leadership will be notified and will call pt back with time for lab appt.  Pt verbalized understanding and expressed appreciation.

## 2014-11-11 NOTE — Progress Notes (Addendum)
Back to top of Results from Last 3 Months   CT Head W Contrast (11/02/2014 10:43 AM) CT Head W Contrast (11/02/2014 10:43 AM)  Narrative  EXAM: Computed tomography, head or brain with contrast material.  DATE: 11/02/14 10:43:59  ACCESSION: [id number] UN  DICTATED: 11/02/14 13:10:41  INTERPRETATION LOCATION: [place]    CLINICAL INDICATION: 59 Year Old (F): C43.9 - Melanoma (RAF-HCC). MALIG NEOPLASM BRAIN UNSPEC SITE    COMPARISON: CT of the head dated August 03, 2014, May 04, 2014. MRI of the brain dated June 22, 2014, Jul 07, 2013.    TECHNIQUE: Axial CT images from skull base to vertex with contrast.     FINDINGS:   There is decreased conspicuity of left cerebellar enhancing mass from prior.Stable appearance of left parietal lobe enhancing mass with surrounding hypodensity. No new enhancing masses are appreciated.     Major vascular flow voids are unremarkable.     CSF-containing spaces are normal and symmetric for the patient's age.    Status post left parietal and suboccipital craniotomies. No aggressive osseous lesions.     No significant paranasal sinus disease. Globes and orbits are normal.    IMPRESSION:  No significant change from most recent comparison CT. Consider MRI for more sensitive evaluation of metastatic disease as clinically indicated.    CT Head W Contrast (11/02/2014 10:43 AM)  Procedure Note  Interface, Rad Results In - Tue Nov 02, 2014 1:51 PM EDT  EXAM: Computed tomography, head or brain with contrast material. DATE: 11/02/14 10:43:59 ACCESSION: [id number] UN DICTATED: 11/02/14 13:10:41 INTERPRETATION LOCATION: [place]  CLINICAL INDICATION: 59 Year Old (F): C43.9 - Melanoma (RAF-HCC). MALIG NEOPLASM BRAIN UNSPEC SITE   COMPARISON: CT of the head dated August 03, 2014, May 04, 2014. MRI of the brain dated June 22, 2014, Jul 07, 2013.  TECHNIQUE: Axial CT images  from skull base to vertex with contrast.   FINDINGS:  There is decreased conspicuity of left cerebellar enhancing mass from prior. Stable appearance of left parietal lobe enhancing mass with surrounding hypodensity. No new enhancing masses are appreciated.   Major vascular flow voids are unremarkable.   CSF-containing spaces are normal and symmetric for the patient's age.  Status post left parietal and suboccipital craniotomies. No aggressive osseous lesions.   No significant paranasal sinus disease. Globes and orbits are normal.  IMPRESSION: No significant change from most recent comparison CT. Consider MRI for more sensitive evaluation of metastatic disease as clinically indicated.   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    COMPARISON: CT 08/03/14 and PET-CT 09/22/2012    TECHNIQUE: A spiral CT scan was obtained with IV and oral contrast from the thoracic inlet through the pubic symphysis. Images were reconstructed in the axial plane.Coronal and sagittal reformatted images were also provided for further evaluation.    FINDINGS: CHEST: Visualized thyroid gland is unremarkable.    Streak artifact from a left chest wall pacemaker  limits evaluation of the left upper chest. Pacemaker leads extend into the right ventricle and right atrium. Heart size is normal. No pericardial effusion.    Central airways are patent. Triangular ill-defined soft tissue in the anterior mediastinum, unchanged. Tiny amount of irregularity at the dependent portion of the trachea is stable,  possibly a small amount of thickening of the cartilaginous rings or secretions.     No pathologically enlarged mediastinal, hilar or axillary lymph nodes. Small surgical clip is noted in the right axilla.    No suspicious pulmonary nodules or masses. Previously noted RLL medial segment pulmonary nodule is again resolved. Minimal dependent atelectasis is present. No pleural effusion or consolidation.    External soft tissues of the chest are unremarkable.    ABDOMEN and PELVIS: Faint subcentimeter hypoattenuating lesion in hepatic segment IV is unchanged (8:14). No new liver lesions.    Tiny amount of increased attenuation at the gallbladder neck may represent small gallstones, sludge or vessel. No evidence of acute cholecystitis. No biliary duct dilation.    The spleen, pancreas, adrenal glands and kidneys are unremarkable.    There is a moderate-sized hiatal hernia, unchanged. Colonic diverticulosis in the descending and sigmoid colon without evidence of acute diverticulitis is again noted. No evidence of bowel inflammation or obstruction. The appendix is normal.    Urinary bladder, uterus and right ovary are unremarkable. Left ovary is not well-seen.    No free fluid or free air in the abdomen or pelvis.    No pathologically enlarged lymph nodes in the abdomen or pelvis.    Aorta and its branches are patent. Portal vasculature is patent.    External soft tissues of the abdomen and pelvis are unremarkable.    BONES: No evidence of osseous metastatic disease. Tiny sclerotic foci in the left femoral head, T12 vertebral body and left acetabulum are stable since 2014.    IMPRESSION:  -- Unchanged faint subcentimeter hepatic segment IV liver lesion.   -- Unchanged moderate hiatal hernia.    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  COMPARISON: CT 08/03/14 and PET-CT 09/22/2012  TECHNIQUE: A spiral CT scan was obtained with IV and oral contrast from the thoracic inlet through the pubic symphysis. Images were reconstructed in the axial plane. Coronal and sagittal reformatted images were also provided for further evaluation.  FINDINGS: CHEST: Visualized thyroid gland is unremarkable.  Streak artifact from a left chest wall pacemaker limits evaluation of the left upper chest. Pacemaker leads extend into the right ventricle and right atrium. Heart size is normal. No pericardial effusion.  Central airways are patent. Triangular ill-defined soft tissue in the anterior mediastinum, unchanged. Tiny amount of irregularity at the dependent portion of the trachea is stable, possibly a small amount of thickening of the cartilaginous rings or secretions.   No pathologically enlarged mediastinal, hilar or axillary lymph nodes. Small surgical clip is noted in the right axilla.  No suspicious pulmonary nodules or masses. Previously noted RLL medial segment pulmonary nodule is again resolved. Minimal dependent atelectasis is present. No pleural effusion or consolidation.  External soft tissues of the chest are unremarkable.  ABDOMEN and PELVIS: Faint subcentimeter hypoattenuating lesion in  hepatic segment IV is unchanged (8:14). No new liver lesions.  Tiny amount of increased attenuation at the gallbladder neck may represent small gallstones, sludge or vessel. No evidence of acute cholecystitis. No biliary duct dilation.  The spleen, pancreas, adrenal glands and kidneys are unremarkable.  There is a moderate-sized hiatal hernia, unchanged. Colonic diverticulosis in the descending and sigmoid  colon without evidence of acute diverticulitis is again noted. No evidence of bowel inflammation or obstruction. The appendix is normal.  Urinary bladder, uterus and right ovary are unremarkable. Left ovary is not well-seen.  No free fluid or free air in the abdomen or pelvis.  No pathologically enlarged lymph nodes in the abdomen or pelvis.  Aorta and its branches are patent. Portal vasculature is patent.  External soft tissues of the abdomen and pelvis are unremarkable.  BONES: No evidence of osseous metastatic disease. Tiny sclerotic foci in the left femoral head, T12 vertebral body and left acetabulum are stable since 2014.  IMPRESSION: -- Unchanged faint subcentimeter hepatic segment IV liver lesion.  -- Unchanged moderate hiatal hernia.   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    COMPARISON: CT 08/03/14 and PET-CT 09/22/2012    TECHNIQUE: A spiral CT scan was obtained with IV and oral contrast from the thoracic inlet through the pubic symphysis. Images were reconstructed in the axial plane.Coronal and sagittal reformatted images were also provided for further evaluation.    FINDINGS: CHEST: Visualized thyroid gland is unremarkable.    Streak artifact from a left chest wall pacemaker limits evaluation of the left upper chest. Pacemaker leads extend into the right ventricle and right atrium. Heart size is normal. No pericardial effusion.    Central airways are patent. Triangular ill-defined soft tissue in the anterior mediastinum, unchanged. Tiny amount of irregularity at the  dependent portion of the trachea is stable, possibly a small amount of thickening of the cartilaginous rings or secretions.     No pathologically enlarged mediastinal, hilar or axillary lymph nodes. Small surgical clip is noted in the right axilla.    No suspicious pulmonary nodules or masses. Previously noted RLL medial segment pulmonary nodule is again resolved. Minimal dependent atelectasis is present. No pleural effusion or consolidation.    External soft tissues of the chest are unremarkable.    ABDOMEN and PELVIS: Faint subcentimeter hypoattenuating lesion in hepatic segment IV is unchanged (8:14). No new liver lesions.    Tiny amount of increased attenuation at the gallbladder neck may represent small gallstones, sludge or vessel. No evidence of acute cholecystitis. No biliary duct dilation.    The spleen, pancreas, adrenal glands and kidneys are unremarkable.    There is a moderate-sized hiatal hernia, unchanged. Colonic diverticulosis in the descending and sigmoid colon without evidence of acute diverticulitis is again noted. No evidence of bowel inflammation or obstruction. The appendix is normal.    Urinary bladder, uterus and right ovary are unremarkable. Left ovary is not well-seen.    No free fluid or free air in the abdomen or pelvis.    No pathologically enlarged lymph nodes in the abdomen or pelvis.    Aorta and  its branches are patent. Portal vasculature is patent.    External soft tissues of the abdomen and pelvis are unremarkable.    BONES: No evidence of osseous metastatic disease. Tiny sclerotic foci in the left femoral head, T12 vertebral body and left acetabulum are stable since 2014.    IMPRESSION:  -- Unchanged faint subcentimeter hepatic segment IV liver lesion.   -- Unchanged moderate hiatal hernia.    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  COMPARISON: CT 08/03/14 and PET-CT 09/22/2012  TECHNIQUE: A spiral CT scan was obtained with IV and oral contrast from the thoracic inlet through the pubic symphysis. Images were reconstructed in the axial plane. Coronal and sagittal reformatted images were also provided for further evaluation.  FINDINGS: CHEST: Visualized thyroid gland is unremarkable.  Streak artifact from a left chest wall pacemaker limits evaluation of the left upper chest. Pacemaker leads extend into the right ventricle and right atrium. Heart size is normal. No pericardial effusion.  Central airways are patent. Triangular ill-defined soft tissue in the anterior mediastinum, unchanged. Tiny amount of irregularity at the dependent portion of the trachea is stable, possibly a small amount of thickening of the cartilaginous rings or secretions.   No pathologically enlarged mediastinal, hilar or axillary lymph nodes. Small surgical clip is noted in the right axilla.  No suspicious pulmonary nodules or masses. Previously noted RLL medial segment pulmonary nodule is again resolved. Minimal dependent atelectasis is present. No pleural effusion or consolidation.  External soft tissues of the chest are unremarkable.  ABDOMEN and PELVIS: Faint subcentimeter hypoattenuating lesion in hepatic segment IV is unchanged (8:14). No new liver lesions.  Tiny amount of increased attenuation at the gallbladder neck may represent small gallstones, sludge or vessel. No evidence of acute cholecystitis. No biliary duct dilation.  The spleen, pancreas, adrenal glands and kidneys are unremarkable.  There is a moderate-sized hiatal hernia, unchanged. Colonic diverticulosis in  the descending and sigmoid colon without evidence of acute diverticulitis is again noted. No evidence of bowel inflammation or obstruction. The appendix is normal.  Urinary bladder, uterus and right ovary are unremarkable. Left ovary is not well-seen.  No free fluid or free air in the abdomen or pelvis.  No pathologically enlarged lymph nodes in the abdomen or pelvis.  Aorta and its branches are patent. Portal vasculature is patent.  External soft tissues of the abdomen and pelvis are unremarkable.  BONES: No evidence of osseous metastatic disease. Tiny sclerotic foci in the left femoral head, T12 vertebral body and left acetabulum are stable since 2014.  IMPRESSION: -- Unchanged faint subcentimeter hepatic segment IV liver lesion.  -- Unchanged moderate hiatal hernia.    No pain, no headache, just weakness in legs, gave hemocult cards to lab this am No o nausea, pain,  BP 120/82 mmHg  Pulse 72  Temp(Src) 97.7 F (36.5 C) (Oral)  Resp 20  Ht '5\' 3"'$  (1.6 m)  Wt 151 lb 4.8 oz (68.629 kg)  BMI 26.81 kg/m2  Wt Readings from Last 3 Encounters:  11/15/14 151 lb 4.8 oz (68.629 kg)  10/12/14 152 lb 1.6 oz (68.992 kg)  08/10/14 150 lb 12.8 oz (68.402 kg)

## 2014-11-11 NOTE — Telephone Encounter (Signed)
Pt here for lab appt '@1130'$ .  Explained to pt again that lab did not have stool card to process from 10/13/14.  Pt given another stool card envelope for collection of stool and pt states she will drop off card tomorrow 9/16.  Pt requested call with results of iron panel and stool results.  Informed her that results may take 1-2 days for iron panel to return d/t it has to be sent out and depending on when she drops off stool card Friday, if office will have results by end of Friday or will call pt first thing Monday.  Pt verbalized understanding and expressed appreciation for information.

## 2014-11-12 NOTE — Telephone Encounter (Signed)
Call back from Autumn, [place]. The fax will come to Dr. Harrington Challenger to sign when it is generated from [employer] billing dept. The verbal order is good for 60 days.

## 2014-11-12 NOTE — Telephone Encounter (Signed)
Per Ned Card, NP; notified pt that ferritin level has improved from 10 in June to 24 on 9/16; instructions given to continue iron supplement as directed. Pt verbalized understanding and confirmed appt for 11/24/14. Pt confirmed she has not dropped off stool card yet "hope to drop it off on Monday"

## 2014-11-12 NOTE — Telephone Encounter (Signed)
I have not seen any orders from [facility] yet, renewing the fluid orders. Left message on Autumn's # to call back.

## 2014-11-15 ENCOUNTER — Encounter: Payer: Self-pay | Admitting: Radiation Oncology

## 2014-11-15 ENCOUNTER — Ambulatory Visit
Admission: RE | Admit: 2014-11-15 | Discharge: 2014-11-15 | Disposition: A | Discharge status: Home / Self Care | Payer: BLUE CROSS/BLUE SHIELD | Source: Ambulatory Visit | Attending: Radiation Oncology | Admitting: Radiation Oncology

## 2014-11-15 ENCOUNTER — Ambulatory Visit (HOSPITAL_BASED_OUTPATIENT_CLINIC_OR_DEPARTMENT_OTHER): Payer: Medicare Other

## 2014-11-15 ENCOUNTER — Telehealth: Payer: Self-pay | Admitting: *Deleted

## 2014-11-15 VITALS — BP 120/82 | HR 72 | Temp 97.7°F | Resp 20 | Ht 63.0 in | Wt 151.3 lb

## 2014-11-15 DIAGNOSIS — D509 Iron deficiency anemia, unspecified: Secondary | ICD-10-CM

## 2014-11-15 DIAGNOSIS — C7931 Secondary malignant neoplasm of brain: Secondary | ICD-10-CM

## 2014-11-15 LAB — FECAL OCCULT BLOOD, GUAIAC: Occult Blood: NEGATIVE

## 2014-11-15 NOTE — Progress Notes (Signed)
Radiation Oncology         [phone] ________________________________  Name: [name] MRN: [id number]  Date: 11/15/2014  DOB: 08/22/1955  Follow-Up Visit Note  CC: [name], MD  [name], MD  Diagnosis:   Metastatic melanoma with brain metastasis  Interval Since Last Radiation:  2 years  Narrative:  The patient returns today for routine follow-up. Recent [facility] results as follows:   CT Head W Contrast (11/02/2014 10:43 AM)  Narrative  EXAM: Computed tomography, head or brain with contrast material.  DATE: 11/02/14 10:43:59  ACCESSION: [id number] UN  DICTATED: 11/02/14 13:10:41  INTERPRETATION LOCATION: [place]    CLINICAL INDICATION: 59 Year Old (F): C43.9 - Melanoma (RAF-HCC). MALIG NEOPLASM BRAIN UNSPEC SITE    COMPARISON: CT of the head dated August 03, 2014, May 04, 2014. MRI of the brain dated June 22, 2014, Jul 07, 2013.    TECHNIQUE: Axial CT images from skull base to vertex with contrast.     FINDINGS:   There is decreased conspicuity of left cerebellar enhancing mass from prior.Stable appearance of left parietal lobe enhancing mass with surrounding hypodensity. No new enhancing masses are appreciated.     Major vascular flow voids are unremarkable.     CSF-containing spaces are normal and symmetric for the patient's age.    Status post left parietal and suboccipital craniotomies. No aggressive osseous lesions.     No significant paranasal sinus disease. Globes and orbits are normal.    IMPRESSION:  No significant change from most recent comparison CT. Consider MRI for more sensitive evaluation of metastatic disease as clinically indicated.    No pain, no headache, just weakness in legs, gave hemocult cards to lab this am No c/o nausea, pain.   Patient notes recent bout of anemia it is currently being treated for it.             ALLERGIES:  is allergic to doxycycline; erythromycin; penicillins;  preparation h; promethazine; sulfonamide derivatives; and phenergan.  Meds: Current Outpatient Prescriptions  Medication Sig Dispense Refill  . acetaminophen (TYLENOL) 500 MG tablet Take 1,000 mg by mouth every 6 (six) hours as needed for pain.    Marland Kitchen albuterol (VENTOLIN HFA) 108 (90 BASE) MCG/ACT inhaler Inhale 2 puffs into the lungs as needed.    . bimatoprost (LATISSE) 0.03 % ophthalmic solution Apply 1 application topically at bedtime.    . clonazePAM (KLONOPIN) 0.5 MG tablet Take 1 tablet (0.5 mg total) by mouth 3 (three) times daily as needed for anxiety. 50 tablet 0  . cloNIDine (CATAPRES) 0.1 MG tablet TAKE ONE TABLET BY MOUTH AT BEDTIME  90 tablet 3  . cyanocobalamin (,VITAMIN B-12,) 1000 MCG/ML injection ADMINISTER 1 ML UNDER THE SKIN 1 TIME A MONTH AS DIRECTED 1 mL 0  . diphenhydrAMINE (BENADRYL) 25 MG tablet Take 12.5 mg by mouth every 6 (six) hours as needed. Seasonal allergies    . esomeprazole (NEXIUM) 40 MG capsule Take 1 capsule (40 mg total) by mouth 2 (two) times daily before a meal. 60 capsule 2  . ferrous sulfate 325 (65 FE) MG EC tablet Take 1 tablet (325 mg total) by mouth daily with breakfast.  3  . furosemide (LASIX) 40 MG tablet Take 40 mg by mouth daily. Takes PRN    . hydrocortisone (ANUSOL-HC) 25 MG suppository Place 25 mg rectally at bedtime as needed for hemorrhoids.    . hydrocortisone 2.5 % cream Apply 1 application topically 2 (two) times daily as needed (  irritation).     Marland Kitchen levETIRAcetam (KEPPRA) 500 MG tablet Take 500 mg by mouth 2 (two) times daily.     . mesalamine (CANASA) 1000 MG suppository Place 1,000 mg rectally at bedtime as needed (hemorrhoids).     . metFORMIN (GLUCOPHAGE) 500 MG tablet Take 500 mg by mouth daily after breakfast.    . metoprolol tartrate (LOPRESSOR) 25 MG tablet TAKE ONE TABLET BY MOUTH TWICE DAILY 60 tablet 5  . mometasone (NASONEX) 50 MCG/ACT nasal spray Place 2 sprays into the nose daily.    Marland Kitchen nystatin (MYCOSTATIN) 100000 UNIT/ML  suspension TID    . ondansetron (ZOFRAN-ODT) 4 MG disintegrating tablet Take 4 mg by mouth as needed. '4mg'$  ODT q4 hours prn nausea/vomit    . polyethylene glycol powder (GLYCOLAX/MIRALAX) powder Stir 17 grams in 8oz of water, or juice, 1 or 2 times daily for constipation PRN    . Potassium Chloride ER 20 MEQ TBCR Take 20 mEq by mouth 2 (two) times daily. 60 tablet 1  . potassium chloride SA (K-DUR,KLOR-CON) 20 MEQ tablet TAKE ONE TABLET BY MOUTH TWICE DAILY 60 tablet 3  . Vitamin D, Ergocalciferol, (DRISDOL) 50000 UNITS CAPS capsule Take 50,000 Units by mouth every 7 (seven) days.    . fluconazole (DIFLUCAN) 100 MG tablet Take 1 tablet (100 mg total) by mouth daily. (Patient not taking: Reported on 11/15/2014) 2 tablet 0   No current facility-administered medications for this encounter.    Physical Findings: The patient is in no acute distress. Patient is alert and oriented.  height is '5\' 3"'$  (1.6 m) and weight is 151 lb 4.8 oz (68.629 kg). Her oral temperature is 97.7 F (36.5 C). Her blood pressure is 120/82 and her pulse is 72. Her respiration is 20. .     Lab Findings: Lab Results  Component Value Date   WBC 5.5 10/12/2014   HGB 12.6 10/12/2014   HCT 38.5 10/12/2014   MCV 76.5* 10/12/2014   PLT 274 10/12/2014     Radiographic Findings: Ct Outside Films Head/face  11/11/2014   CLINICAL DATA:    This exam is stored here for comparison purposes only and was performed at  an outside facility.   Please contact the originating institution for any  associated interpretation or report.    Ct Outside Films Body  11/11/2014   CLINICAL DATA:    This exam is stored here for comparison purposes only and was performed at  an outside facility.   Please contact the originating institution for any  associated interpretation or report.    Impression: The patient clinically is doing very well. No significant change from most recent comparison CT. We reviewed her scan in brain conference.   Saw  Dr.Elsner last week.   Plan: I will continue to review with patient's future scans and will follow up in about 3 months.   I spent 20 minutes with the patient today, the majority of which was spent counseling the patient on the diagnosis of cancer and coordinating care.  ------------------------------------------------  Jodelle Gross, MD, PhD  This document serves as a record of services personally performed by Kyung Rudd, MD. It was created on his behalf by Derek Mound, a trained medical scribe. The creation of this record is based on the scribe's personal observations and the provider's statements to them. This document has been checked and approved by the attending provider.

## 2014-11-15 NOTE — Telephone Encounter (Signed)
error 

## 2014-11-24 ENCOUNTER — Telehealth: Payer: Self-pay | Admitting: Oncology

## 2014-11-24 ENCOUNTER — Other Ambulatory Visit (HOSPITAL_BASED_OUTPATIENT_CLINIC_OR_DEPARTMENT_OTHER): Payer: Medicare Other

## 2014-11-24 ENCOUNTER — Ambulatory Visit (HOSPITAL_BASED_OUTPATIENT_CLINIC_OR_DEPARTMENT_OTHER): Payer: Medicare Other | Admitting: Oncology

## 2014-11-24 VITALS — BP 123/90 | HR 77 | Temp 97.8°F | Resp 18 | Ht 63.0 in | Wt 149.7 lb

## 2014-11-24 DIAGNOSIS — C439 Malignant melanoma of skin, unspecified: Secondary | ICD-10-CM | POA: Diagnosis not present

## 2014-11-24 LAB — COMPREHENSIVE METABOLIC PANEL (CC13)
ALT: 25 U/L (ref 0–55)
AST: 27 U/L (ref 5–34)
Albumin: 4.8 g/dL (ref 3.5–5.0)
Alkaline Phosphatase: 160 U/L — ABNORMAL HIGH (ref 40–150)
Anion Gap: 10 mEq/L (ref 3–11)
BUN: 14.5 mg/dL (ref 7.0–26.0)
CO2: 28 mEq/L (ref 22–29)
Calcium: 9.8 mg/dL (ref 8.4–10.4)
Chloride: 104 mEq/L (ref 98–109)
Creatinine: 0.9 mg/dL (ref 0.6–1.1)
EGFR: 72 mL/min/{1.73_m2} — ABNORMAL LOW (ref >90)
Glucose: 95 mg/dl (ref 70–140)
Potassium: 4.1 mEq/L (ref 3.5–5.1)
Sodium: 141 mEq/L (ref 136–145)
Total Bilirubin: 0.41 mg/dL (ref 0.20–1.20)
Total Protein: 8 g/dL (ref 6.4–8.3)

## 2014-11-24 LAB — CBC WITH DIFFERENTIAL/PLATELET
BASO%: 1.9 % (ref 0.0–2.0)
Basophils Absolute: 0.1 10*3/uL (ref 0.0–0.1)
EOS%: 1.4 % (ref 0.0–7.0)
Eosinophils Absolute: 0.1 10*3/uL (ref 0.0–0.5)
HCT: 41.9 % (ref 34.8–46.6)
HGB: 14 g/dL (ref 11.6–15.9)
LYMPH%: 31.8 % (ref 14.0–49.7)
MCH: 26.9 pg (ref 25.1–34.0)
MCHC: 33.4 g/dL (ref 31.5–36.0)
MCV: 80.7 fL (ref 79.5–101.0)
MONO#: 0.6 10*3/uL (ref 0.1–0.9)
MONO%: 12.5 % (ref 0.0–14.0)
NEUT#: 2.7 10*3/uL (ref 1.5–6.5)
NEUT%: 52.4 % (ref 38.4–76.8)
Platelets: 263 10*3/uL (ref 145–400)
RBC: 5.2 10*6/uL (ref 3.70–5.45)
RDW: 18.3 % — ABNORMAL HIGH (ref 11.2–14.5)
WBC: 5.1 10*3/uL (ref 3.9–10.3)
lymph#: 1.6 10*3/uL (ref 0.9–3.3)

## 2014-11-24 LAB — LACTATE DEHYDROGENASE (CC13): LDH: 283 U/L — ABNORMAL HIGH (ref 125–245)

## 2014-11-24 NOTE — Telephone Encounter (Signed)
Gave adn printed appt sched anda vs for pt for DEC

## 2014-11-24 NOTE — Progress Notes (Signed)
[place] OFFICE PROGRESS NOTE   Diagnosis: Melanoma  INTERVAL HISTORY:   She returns as scheduled. She feels well. No seizures or new neurologic symptoms. No abdominal pain or diarrhea. She was seen at [facility] 11/02/2014 restaging CTs revealed no evidence of disease progression.  Objective:  Vital signs in last 24 hours:  Blood pressure 123/90, pulse 77, temperature 97.8 F (36.6 C), temperature source Oral, resp. rate 18, height 5' 3"  (1.6 m), weight 149 lb 11.2 oz (67.903 kg), SpO2 100 %.    HEENT: Neck without mass Lymphatics: No cervical, supraclavicular, or axillary nodes Resp: Lungs clear bilaterally Cardio: Regular rate and rhythm GI: No hepatomegaly, nontender, no mass Vascular: No leg edema  Skin: Right upper arm scar without evidence of recurrent tumor     Lab Results:  Lab Results  Component Value Date   WBC 5.1 11/24/2014   HGB 14.0 11/24/2014   HCT 41.9 11/24/2014   MCV 80.7 11/24/2014   PLT 263 11/24/2014   NEUTROABS 2.7 11/24/2014     Medications: I have reviewed the patient's current medications.  Assessment/Plan: 1.Metastatic melanoma  -Status post resection of a right arm melanoma, stage II A (T2b N0) in December 2009  -Metastatic melanoma, status post resection of a left cerebellar metastasis 09/30/2012, BRAF mutation identified  -Status post SRS treatment of the left cerebellum and a left parietal metastasis 10/24/2012  -Staging CTs and PET scan without evidence of distant metastatic disease aside from indeterminate small lung nodules  -Restaging CTs 12/01/2012 consistent with enlargement of the left parietal mass and right lower lobe lung nodule  -Initiation of dabrafenib and tremetinib 12/17/2012. She reports extremely poor tolerance. Discontinued.  -Status post resection of a left parietal metastasis 02/09/2013.  -Restaging CT evaluation 03/03/2013. Stable 1.5 cm right lower lobe pulmonary nodule. Multiple peripheral  enhancing lesions throughout the liver.  -Initiation of Ipilimumab at [facility] 03/10/2013.  -Cycle 2 Ipilumumab 04/17/2013  -Cycle 3 Ipilumumab 05/14/2013  -Cycle 4 Ipilumumab 06/04/2013  -[facility] CT at [facility] 06/25/2013 with multiple subcentimeter liver lesions, no new lesions, decreased right lower lobe nodule.  -Brain MRI 07/07/2013 with no evidence of progressive metastatic disease.  -Brain CT 10/09/2013 with no evidence of progressive metastatic disease  -Brain CT and CTs of the chest, abdomen, and pelvis at [facility] on 11/03/2013 with no evidence of progressive disease  -CTs of the brain, chest, abdomen, and pelvis at [facility] on 02/02/2014 with no evidence of disease progression -CTs of the brain, chest, abdomen, and pelvis at [facility] on 05/04/2014-no evidence of disease progression -CTs of the brain, chest, abdomen, and pelvis at [facility] on 08/03/2014-stable -CTs of the brain, chest, abdomen, and pelvis at [facility] on 11/02/2014-stable 2. Arrhythmia, pacemaker in place.  3. Hypertension  4. History of a CVA  5. History of deep vein thrombosis  6. History of proximal leg weakness-likely related to chronic steroid use.  7. Intermittent rash at the posterior thighs and left hand-likely related to systemic therapy.  8. Leg edema-? Related to liver disease,? Secondary to ipilumumab . Improved on furosemide.  9. Headache and mild nausea-coincided with discontinuation of Decadron, CT of the brain 03/25/2011 with no acute change.  10. 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 History of Mild elevation of lipase-likely mild asymptomatic pancreatitis related to Ipilumumab  12. Red cell microcytosis and iron deficiency 03/17/2014-persistent borderline low iron stores and Red cell microcytosis, negative stool Hemoccults 13. Dysuria-negative urinalyses and urine culture, recommended she follow-up with  her GYN    Disposition:  She appears stable. She will  continue CT follow-up at [facility]. I encouraged her to schedule an appointment at [facility] to evaluate the iron deficiency. The iron deficiency was most likely related to colitis from the ipilumumab.  She will return for an office visit after the next restaging CT evaluation at [facility].  Betsy Coder, MD  11/24/2014  12:00 PM

## 2014-11-24 NOTE — Telephone Encounter (Signed)
Pt confirmed labs/ov per 09/28 POF, gave pt AVS and Calendar... KJ °

## 2014-11-25 ENCOUNTER — Encounter: Payer: Self-pay | Admitting: *Deleted

## 2014-11-29 ENCOUNTER — Encounter: Payer: Self-pay | Admitting: Internal Medicine

## 2014-11-29 ENCOUNTER — Ambulatory Visit (INDEPENDENT_AMBULATORY_CARE_PROVIDER_SITE_OTHER): Payer: Medicare Other | Admitting: Internal Medicine

## 2014-11-29 ENCOUNTER — Telehealth: Payer: Self-pay | Admitting: *Deleted

## 2014-11-29 VITALS — BP 102/80 | HR 74 | Ht 63.0 in | Wt 151.1 lb

## 2014-11-29 DIAGNOSIS — C439 Malignant melanoma of skin, unspecified: Secondary | ICD-10-CM

## 2014-11-29 DIAGNOSIS — I4719 Other supraventricular tachycardia: Secondary | ICD-10-CM

## 2014-11-29 DIAGNOSIS — G909 Disorder of the autonomic nervous system, unspecified: Secondary | ICD-10-CM | POA: Diagnosis not present

## 2014-11-29 DIAGNOSIS — D509 Iron deficiency anemia, unspecified: Secondary | ICD-10-CM

## 2014-11-29 DIAGNOSIS — K529 Noninfective gastroenteritis and colitis, unspecified: Secondary | ICD-10-CM

## 2014-11-29 DIAGNOSIS — C799 Secondary malignant neoplasm of unspecified site: Secondary | ICD-10-CM

## 2014-11-29 DIAGNOSIS — I471 Supraventricular tachycardia: Secondary | ICD-10-CM | POA: Diagnosis not present

## 2014-11-29 DIAGNOSIS — I6320 Cerebral infarction due to unspecified occlusion or stenosis of unspecified precerebral arteries: Secondary | ICD-10-CM

## 2014-11-29 DIAGNOSIS — G901 Familial dysautonomia [Riley-Day]: Secondary | ICD-10-CM

## 2014-11-29 NOTE — Telephone Encounter (Signed)
-----   Message from [name], MD sent at 11/24/2014  3:10 PM EDT ----- Please call patient, ldh is stable, mildly elevated

## 2014-11-29 NOTE — Patient Instructions (Signed)
Your physician recommends that you continue on your current medications as directed. Please refer to the Current Medication list given to you today. Your physician wants you to follow-up in: 1 year with Dr. Ross.  You will receive a reminder letter in the mail two months in advance. If you don't receive a letter, please call our office to schedule the follow-up appointment.  

## 2014-11-29 NOTE — Telephone Encounter (Signed)
Received message from pt requesting iron panel results, referral to GI MD.  Per Dr. Benay Spice; notified pt that iron panel results/ferritin stable; ldh is stable, mildly elevated and Dr. Benay Spice made referral to GI Dr. Hilarie Fredrickson per request.  Pt verbalized understanding and expressed appreciation for call back.

## 2014-11-29 NOTE — Progress Notes (Signed)
Cardiology Office Note   Date:  11/29/2014   ID:  [name], DOB 10-18-1955, MRN [id number]  PCP:  [name], MD  Cardiologist:   Dorris Carnes, MD    F/U of autonomic dysfunction   History of Present Illness: [name] is a 59 y.o. female with a history of autonomic dysfunction, atrial tachycardia including afib, CVA, metastatic melanoma, HL  I saw her in 2015 Since seen she has been followed closely in Heme Onc (here and at [facility]) She denies palpitations.  No severe dizziness or syncope.  Breathing is OK She is functioning on board at [place] Refill  . acetaminophen (TYLENOL) 500 MG tablet Take 1,000 mg by mouth every 6 (six) hours as needed for pain.    Marland Kitchen albuterol (VENTOLIN HFA) 108 (90 BASE) MCG/ACT inhaler Inhale 2 puffs into the lungs as needed.    . bimatoprost (LATISSE) 0.03 % ophthalmic solution Apply 1 application topically at bedtime.    . clonazePAM (KLONOPIN) 0.5 MG tablet Take 1 tablet (0.5 mg total) by mouth 3 (three) times daily as needed for anxiety. 50 tablet 0  . cloNIDine (CATAPRES) 0.1 MG tablet TAKE ONE TABLET BY MOUTH AT BEDTIME  90 tablet 3  . cyanocobalamin (,VITAMIN B-12,) 1000 MCG/ML injection ADMINISTER 1 ML UNDER THE SKIN 1 TIME A MONTH AS DIRECTED 1 mL 0  . diphenhydrAMINE (BENADRYL) 25 MG tablet Take 12.5 mg by mouth every 6 (six) hours as needed. Seasonal allergies    . esomeprazole (NEXIUM) 40 MG capsule Take 1 capsule (40 mg total) by mouth 2 (two) times daily before a meal. 60 capsule 2  . ferrous sulfate 325 (65 FE) MG EC tablet Take 1 tablet (325 mg total) by mouth daily with breakfast.  3  . furosemide (LASIX) 40 MG tablet Take 40 mg by mouth daily. Takes PRN    . hydrocortisone (ANUSOL-HC) 25 MG suppository Place 25 mg rectally at bedtime as needed for hemorrhoids.    . hydrocortisone 2.5 % cream Apply 1 application topically 2 (two) times daily as needed  (irritation).     Marland Kitchen levETIRAcetam (KEPPRA) 500 MG tablet Take 500 mg by mouth 2 (two) times daily.     . mesalamine (CANASA) 1000 MG suppository Place 1,000 mg rectally at bedtime as needed (hemorrhoids).     . metFORMIN (GLUCOPHAGE) 500 MG tablet Take 500 mg by mouth daily after breakfast.    . metoprolol tartrate (LOPRESSOR) 25 MG tablet TAKE ONE TABLET BY MOUTH TWICE DAILY 60 tablet 5  . mometasone (NASONEX) 50 MCG/ACT nasal spray Place 2 sprays into the nose daily.    Marland Kitchen nystatin (MYCOSTATIN) 100000 UNIT/ML suspension Take 5 mLs by mouth 4 (four) times daily. TID    . ondansetron (ZOFRAN-ODT) 4 MG disintegrating tablet Take 4 mg by mouth as needed. '4mg'$  ODT q4 hours prn nausea/vomit    . polyethylene glycol powder (GLYCOLAX/MIRALAX) powder Stir 17 grams in 8oz of water, or juice, 1 or 2 times daily for constipation PRN    . Potassium Chloride ER 20 MEQ TBCR Take 20 mEq by mouth 2 (two) times daily. 60 tablet 1  . Vitamin D, Ergocalciferol, (DRISDOL) 50000 UNITS CAPS capsule Take 50,000 Units by mouth every 7 (seven) days.     No current facility-administered medications for this visit.    Allergies:   Doxycycline; Erythromycin; Penicillins; Preparation h; Promethazine; Sulfonamide derivatives; and Phenergan  Past Medical History  Diagnosis Date  . 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 Hypoglycemia, unspecified   . Other nonspecific abnormal serum enzyme levels   . Cervicalgia   . Disturbance of skin sensation   . Other specified congenital anomalies of nervous system   . Swelling of limb   . Disorder of bone and cartilage, unspecified   . Atrial tachycardia ([place])   . CVA (cerebral infarction)     2012 with dizziness and vision change felt embolic from atrial tachy  . History of pacemaker   . POTS (postural orthostatic tachycardia syndrome)   . Diverticulosis   . Osteopenia   . DVT (deep venous thrombosis) ([place])   . Atrial fibrillation ([place])   . DM  (diabetes mellitus) ([place])   . DVT (deep venous thrombosis) ([place])   . HLD (hyperlipidemia)   . HTN (hypertension)   . Pacemaker     autonomic dysfunction  . Complication of anesthesia     pt states wakes up with "shakes"  . PONV (postoperative nausea and vomiting)   . Stroke [facility])     left weaker   . History of radiation therapy 10/24/12    brain  . Skin cancer     Hx: of lung lesion  . Family history of anesthesia complication     PONV  . Asthma     Past Surgical History  Procedure Laterality Date  . Insert / replace / remove pacemaker  2012    1999, x 3  . Ablation saphenous vein w/ rfa      2002  . Tonsillectomy and adenoidectomy    . Carpal tunnel release Left   . Nose surgery  2010, 2012    for nose bleeds x 2  . Fatty tumor removed      from chest  . Melanoma excision      with removal of lymph nodes, left shoulder  . Deep axillary sentinel node biopsy / excision      due to extensive Melanoma-right arm  . Laparoscopy  09/06/2011    Procedure: LAPAROSCOPY OPERATIVE;  Surgeon: Claiborne Billings A. Pamala Hurry, MD;  Location: [place] [facility];  Service: Gynecology;  Laterality: Left;  with Left Ovarian Cystectomy   . Craniotomy N/A 09/30/2012    suboccipital craniectomy  . Craniotomy Left 02/09/2013    Procedure: LEFT PARIETAL CRANIOTOMY with stealth;  Surgeon: [name], MD;  Location: [place] [facility];  Service: Neurosurgery;  Laterality: Left;  LEFT Parietal Craniotomy for tumor with stealth     Social History:  The patient  reports that she has never smoked. She has never used smokeless tobacco. She reports that she does not drink alcohol or use illicit drugs.   Family History:  The patient's family history includes Aneurysm in her sister; Clotting disorder in her maternal grandmother; Colon polyps in her mother; Crohn's disease in her maternal grandmother; Diabetes in her father, maternal grandmother, and paternal grandmother; Heart disease in her father; Kidney disease in her father;  Stroke in her mother.    ROS:  Please see the history of present illness. All other systems are reviewed and  Negative to the above problem except as noted.    PHYSICAL EXAM: VS:  BP 102/80 mmHg  Pulse 74  Ht '5\' 3"'$  (1.6 m)  Wt 151 lb 1.9 oz (68.548 kg)  BMI 26.78 kg/m2  SpO2 99%  GEN: Well nourished, well developed, in no acute distress HEENT: normal Neck: no JVD,  carotid bruits, or masses Cardiac: RRR; no murmurs, rubs, or gallops,no edema  Respiratory:  clear to auscultation bilaterally, normal work of breathing GI: soft, nontender, nondistended, + BS  No hepatomegaly  MS: no deformity Moving all extremities   Skin: warm and dry, no rash Neuro:  Strength and sensation are intact Psych: euthymic mood, full affect   EKG:  EKG is not ordered today.   Lipid Panel    Component Value Date/Time   CHOL 145 02/09/2014 1419   TRIG 123.0 02/09/2014 1419   HDL 47.90 02/09/2014 1419   CHOLHDL 3 02/09/2014 1419   VLDL 24.6 02/09/2014 1419   LDLCALC 73 02/09/2014 1419   LDLDIRECT 109.7 10/11/2010 1222      Wt Readings from Last 3 Encounters:  11/29/14 151 lb 1.9 oz (68.548 kg)  11/24/14 149 lb 11.2 oz (67.903 kg)  11/15/14 151 lb 4.8 oz (68.629 kg)      ASSESSMENT AND PLAN:  1.  Autonomic dysfunction.  Pt doing good Continue hydration as needed  2.  Tachycardia.  Pt with biotronics device  ON b blocker  Not anticoagualation given melanoma.  No evid of signif recurrence when device interrogated.  Will f/u with Olin Pia.     Signed, Dorris Carnes, MD  11/29/2014 4:39 PM    [address], [place], [place]  [address] Phone: [phone]; Fax: [phone]

## 2014-12-06 ENCOUNTER — Other Ambulatory Visit: Payer: Medicare Other

## 2014-12-06 ENCOUNTER — Ambulatory Visit: Payer: Medicare Other | Admitting: Oncology

## 2014-12-14 DIAGNOSIS — D485 Neoplasm of uncertain behavior of skin: Secondary | ICD-10-CM | POA: Diagnosis not present

## 2014-12-14 DIAGNOSIS — Z85820 Personal history of malignant melanoma of skin: Secondary | ICD-10-CM | POA: Diagnosis not present

## 2014-12-14 DIAGNOSIS — D044 Carcinoma in situ of skin of scalp and neck: Secondary | ICD-10-CM | POA: Diagnosis not present

## 2014-12-14 DIAGNOSIS — Z85828 Personal history of other malignant neoplasm of skin: Secondary | ICD-10-CM | POA: Diagnosis not present

## 2014-12-19 ENCOUNTER — Other Ambulatory Visit: Payer: Self-pay | Admitting: Internal Medicine

## 2014-12-20 NOTE — Telephone Encounter (Signed)
Ok to refill 

## 2014-12-23 ENCOUNTER — Telehealth: Payer: Self-pay | Admitting: Internal Medicine

## 2014-12-23 NOTE — Telephone Encounter (Signed)
New Message   RN from [facility]- 60 day research- calling to get verbal order for IV hydration and HH- sending a 485 as well. Please call back and discuss.

## 2015-01-03 ENCOUNTER — Telehealth: Payer: Self-pay | Admitting: Oncology

## 2015-01-03 ENCOUNTER — Telehealth: Payer: Self-pay | Admitting: *Deleted

## 2015-01-03 ENCOUNTER — Telehealth: Payer: Self-pay | Admitting: Internal Medicine

## 2015-01-03 DIAGNOSIS — C7931 Secondary malignant neoplasm of brain: Secondary | ICD-10-CM

## 2015-01-03 NOTE — Telephone Encounter (Signed)
Called and left a message with new lab appointment

## 2015-01-03 NOTE — Telephone Encounter (Signed)
Received forwarded message from desk nurse - patient calling inquiring about GI referral. Referral entered 10/3 - no pof sent. Rerouted referral to [place] (Dr. Hilarie Fredrickson) and spoke with Abigail Butts @ [facility] - per Abigail Butts all of Dr. Nichola Sizer patient were being routed to two other providers in the office. I informed Abigail Butts that the referral specifically requested Dr. Hilarie Fredrickson and asked if he would see the patient. Per Abigail Butts message sent to Dr. Vena Rua nurse and they will contact patient directly. If Dr. Hilarie Fredrickson cannot see patient - patient will be scheduled with someone else. Left message for patient and also informed BS desk nurse.

## 2015-01-03 NOTE — Telephone Encounter (Signed)
Pt left message to request lab work this week "since I don't come back until Dec; I want to keep track of my anemia"  Per Dr. Benay Spice; notified pt that MD okay for her to get lab work this week and schedulers will call with date/time. Also asking about GI referral; informed pt that referral has been ordered and can discuss status with schedulers. Pt reports she has internal hemorrhoids "what can I take for pain; I don't want to take Motrin because of my anemia" Per Dr. Benay Spice; instructions given to take tylenol every 6 hours as needed; take stool softener; and sitz bath prn.  Pt verbalized understanding of all information and expressed appreciation for call back.

## 2015-01-03 NOTE — Telephone Encounter (Signed)
Orders from [facility] signed by Dr. Harrington Challenger; placed in nurse fax bin in medical records to be faxed.

## 2015-01-03 NOTE — Telephone Encounter (Signed)
Per Melissa @ Dr. Benay Spice. He would like for the pt to continue care with Dr. Hilarie Fredrickson. She states she needs to have a colonoscopy. Will you accept?

## 2015-01-04 ENCOUNTER — Telehealth: Payer: Self-pay | Admitting: Oncology

## 2015-01-04 NOTE — Telephone Encounter (Signed)
Spoke to Dr. Hilarie Fredrickson. He confirmed accepting patient. He also stated pt can be seen with an APP once the December schedule come available. I called Audie Clear @ Dr. Gearldine Shown office, and explained. Will call patient with December schedule.

## 2015-01-04 NOTE — Telephone Encounter (Signed)
Abigail Butts from [facility] called back today and per Abigail Butts Dr. Hilarie Fredrickson has accepted patient and per Dr. Hilarie Fredrickson patient can be seen by a PA in December. Per Abigail Butts office will contact patient re December appointment when schedule is open. Left message for patient yesterday re [facility] contacting her with appointment. See previous note.

## 2015-01-04 NOTE — Telephone Encounter (Signed)
Yes, given her history of malignancy would arrange appointment with me or advanced practitioner to discuss and schedule colonoscopy

## 2015-01-05 ENCOUNTER — Ambulatory Visit (HOSPITAL_BASED_OUTPATIENT_CLINIC_OR_DEPARTMENT_OTHER): Payer: Medicare Other

## 2015-01-05 ENCOUNTER — Other Ambulatory Visit: Payer: Medicare Other

## 2015-01-05 DIAGNOSIS — E611 Iron deficiency: Secondary | ICD-10-CM | POA: Diagnosis not present

## 2015-01-05 DIAGNOSIS — C7931 Secondary malignant neoplasm of brain: Secondary | ICD-10-CM

## 2015-01-05 LAB — CBC WITH DIFFERENTIAL/PLATELET
BASO%: 0.5 % (ref 0.0–2.0)
Basophils Absolute: 0 10*3/uL (ref 0.0–0.1)
EOS%: 0.8 % (ref 0.0–7.0)
Eosinophils Absolute: 0.1 10*3/uL (ref 0.0–0.5)
HCT: 42.4 % (ref 34.8–46.6)
HGB: 14.4 g/dL (ref 11.6–15.9)
LYMPH%: 25.9 % (ref 14.0–49.7)
MCH: 28.3 pg (ref 25.1–34.0)
MCHC: 34 g/dL (ref 31.5–36.0)
MCV: 83.3 fL (ref 79.5–101.0)
MONO#: 0.7 10*3/uL (ref 0.1–0.9)
MONO%: 11.7 % (ref 0.0–14.0)
NEUT#: 3.6 10*3/uL (ref 1.5–6.5)
NEUT%: 61.1 % (ref 38.4–76.8)
Platelets: 276 10*3/uL (ref 145–400)
RBC: 5.09 10*6/uL (ref 3.70–5.45)
RDW: 15 % — ABNORMAL HIGH (ref 11.2–14.5)
WBC: 5.9 10*3/uL (ref 3.9–10.3)
lymph#: 1.5 10*3/uL (ref 0.9–3.3)

## 2015-01-05 LAB — COMPREHENSIVE METABOLIC PANEL (CC13)
ALT: 28 U/L (ref 0–55)
AST: 26 U/L (ref 5–34)
Albumin: 4.5 g/dL (ref 3.5–5.0)
Alkaline Phosphatase: 159 U/L — ABNORMAL HIGH (ref 40–150)
Anion Gap: 12 mEq/L — ABNORMAL HIGH (ref 3–11)
BUN: 17.2 mg/dL (ref 7.0–26.0)
CO2: 27 mEq/L (ref 22–29)
Calcium: 9.9 mg/dL (ref 8.4–10.4)
Chloride: 103 mEq/L (ref 98–109)
Creatinine: 0.8 mg/dL (ref 0.6–1.1)
EGFR: 78 mL/min/{1.73_m2} — ABNORMAL LOW (ref >90)
Glucose: 99 mg/dl (ref 70–140)
Potassium: 3.9 mEq/L (ref 3.5–5.1)
Sodium: 141 mEq/L (ref 136–145)
Total Bilirubin: 0.36 mg/dL (ref 0.20–1.20)
Total Protein: 7.8 g/dL (ref 6.4–8.3)

## 2015-01-05 LAB — LACTATE DEHYDROGENASE (CC13): LDH: 262 U/L — ABNORMAL HIGH (ref 125–245)

## 2015-01-05 LAB — FERRITIN CHCC: Ferritin: 31 ng/ml (ref 9–269)

## 2015-01-08 ENCOUNTER — Other Ambulatory Visit: Payer: Self-pay | Admitting: Oncology

## 2015-01-12 ENCOUNTER — Other Ambulatory Visit: Payer: Self-pay | Admitting: Internal Medicine

## 2015-01-12 NOTE — Telephone Encounter (Signed)
New message       *STAT* If patient is at the pharmacy, call can be transferred to refill team.   1. Which medications need to be refilled? (please list name of each medication and dose if known)  B12 injection  2. Which pharmacy/location (including street and city if local pharmacy) is medication to be sent to? [facility] 3. Do they need a 30 day or 90 day supply? 90 day supply

## 2015-01-27 ENCOUNTER — Other Ambulatory Visit: Payer: Self-pay | Admitting: Internal Medicine

## 2015-01-28 ENCOUNTER — Other Ambulatory Visit: Payer: Self-pay | Admitting: *Deleted

## 2015-01-28 DIAGNOSIS — D500 Iron deficiency anemia secondary to blood loss (chronic): Secondary | ICD-10-CM

## 2015-01-28 MED ORDER — NYSTATIN 100000 UNIT/ML MT SUSP: 5.0000 mL | Freq: Four times a day (QID) | OROMUCOSAL | Status: DC

## 2015-01-28 NOTE — Telephone Encounter (Signed)
Pt called reports she has thrush; requesting refill of Nystatin.  Per Dr. Benay Spice; notified pt that re-fill has been sent.  Pt verbalized understanding and expressed appreciation for call back.

## 2015-01-28 NOTE — Addendum Note (Signed)
Addended by: Domenic Schwab on: 01/28/2015 12:04 PM   Modules accepted: Orders

## 2015-02-01 ENCOUNTER — Other Ambulatory Visit: Payer: Self-pay

## 2015-02-01 MED ORDER — METOPROLOL TARTRATE 25 MG PO TABS: 25.0000 mg | ORAL_TABLET | Freq: Two times a day (BID) | ORAL | Status: DC

## 2015-02-08 DIAGNOSIS — Z79899 Other long term (current) drug therapy: Secondary | ICD-10-CM | POA: Diagnosis not present

## 2015-02-08 DIAGNOSIS — C439 Malignant melanoma of skin, unspecified: Secondary | ICD-10-CM | POA: Diagnosis not present

## 2015-02-08 DIAGNOSIS — C719 Malignant neoplasm of brain, unspecified: Secondary | ICD-10-CM | POA: Diagnosis not present

## 2015-02-08 DIAGNOSIS — C7931 Secondary malignant neoplasm of brain: Secondary | ICD-10-CM | POA: Diagnosis not present

## 2015-02-08 DIAGNOSIS — Z6827 Body mass index (BMI) 27.0-27.9, adult: Secondary | ICD-10-CM | POA: Diagnosis not present

## 2015-02-10 DIAGNOSIS — G9389 Other specified disorders of brain: Secondary | ICD-10-CM | POA: Diagnosis not present

## 2015-02-10 DIAGNOSIS — Z6826 Body mass index (BMI) 26.0-26.9, adult: Secondary | ICD-10-CM | POA: Diagnosis not present

## 2015-02-14 ENCOUNTER — Telehealth: Payer: Self-pay | Admitting: Internal Medicine

## 2015-02-14 NOTE — Telephone Encounter (Signed)
New Message      Office calling stating that pt usually has an MRI done yearly with the supervision of Dr. Caryl Comes. It is time for pt to have another one done and they are looking at doing it the first week of January and want to know when Dr. Caryl Comes would be available to supervise. Please call back and advise.

## 2015-02-15 NOTE — Telephone Encounter (Signed)
[name] called to schedule MRI with Dr. Olin Pia and [employer]'s supervision.  Informed [name] that Dr. Caryl Comes and his nurse are out of the office today.  Explained to [name] that Dr. Olin Pia nurse will review schedule and call as soon as possible.  [name] reiterated that they would like to have MRI done the first week of January.

## 2015-02-16 ENCOUNTER — Other Ambulatory Visit: Payer: Self-pay | Admitting: Radiation Oncology

## 2015-02-16 ENCOUNTER — Inpatient Hospital Stay
Admission: RE | Admit: 2015-02-16 | Discharge: 2015-02-16 | Disposition: A | Discharge status: Home / Self Care | Payer: Self-pay | Source: Ambulatory Visit | Attending: Radiation Oncology | Admitting: Radiation Oncology

## 2015-02-16 DIAGNOSIS — C801 Malignant (primary) neoplasm, unspecified: Secondary | ICD-10-CM

## 2015-02-16 NOTE — Telephone Encounter (Signed)
I spoke with the patient today. She is needing an MRI of the brain scheduled as she has had recent elevation in her LDH and some unusual symptoms for her.  I advised her I was needing to speak with Dr. Caryl Comes and see if he would be available to an MRI next week if possible and that I would call her back by the end of the week hopefully with an answer.  Call back from Dr. Caryl Comes- he would be available for her MRI next Wednesday 12/28 or Thursday 12/29 of this can be done around 6 pm. I will speak with [employer] and try to arrange for next week if possible.

## 2015-02-16 NOTE — Telephone Encounter (Signed)
Follow up      Calling to see if anyone has called to get clearance on pt having MRI.  Please call and give her an update on the MRI being scheduled

## 2015-02-17 ENCOUNTER — Other Ambulatory Visit: Payer: Self-pay | Admitting: Radiation Therapy

## 2015-02-17 DIAGNOSIS — C7931 Secondary malignant neoplasm of brain: Secondary | ICD-10-CM

## 2015-02-17 DIAGNOSIS — C7949 Secondary malignant neoplasm of other parts of nervous system: Principal | ICD-10-CM

## 2015-02-17 NOTE — Telephone Encounter (Signed)
Call back from [name]- the patient's MRI is scheduled for Thursday 02/24/15 at 6 pm. Ruby Cola with [employer] is aware.

## 2015-02-17 NOTE — Telephone Encounter (Signed)
Follow Up  [name] with [facility] called back. Please call.

## 2015-02-17 NOTE — Telephone Encounter (Signed)
I spoke [name] and made her aware that Dr. Caryl Comes said he could do 12/28/ or 12/29 at 6 pm. [name] will try to arrange and I will get [employer] scheduled. Will wait for a call back from [place].

## 2015-02-18 DIAGNOSIS — L859 Epidermal thickening, unspecified: Secondary | ICD-10-CM | POA: Diagnosis not present

## 2015-02-18 DIAGNOSIS — Z85820 Personal history of malignant melanoma of skin: Secondary | ICD-10-CM | POA: Diagnosis not present

## 2015-02-18 DIAGNOSIS — Z85828 Personal history of other malignant neoplasm of skin: Secondary | ICD-10-CM | POA: Diagnosis not present

## 2015-02-18 DIAGNOSIS — M71372 Other bursal cyst, left ankle and foot: Secondary | ICD-10-CM | POA: Diagnosis not present

## 2015-02-18 DIAGNOSIS — D485 Neoplasm of uncertain behavior of skin: Secondary | ICD-10-CM | POA: Diagnosis not present

## 2015-02-23 ENCOUNTER — Other Ambulatory Visit (HOSPITAL_BASED_OUTPATIENT_CLINIC_OR_DEPARTMENT_OTHER): Payer: Medicare Other

## 2015-02-23 ENCOUNTER — Ambulatory Visit (HOSPITAL_BASED_OUTPATIENT_CLINIC_OR_DEPARTMENT_OTHER): Payer: BLUE CROSS/BLUE SHIELD | Admitting: Oncology

## 2015-02-23 ENCOUNTER — Telehealth: Payer: Self-pay | Admitting: Oncology

## 2015-02-23 ENCOUNTER — Telehealth: Payer: Self-pay | Admitting: Internal Medicine

## 2015-02-23 VITALS — BP 110/82 | HR 73 | Temp 97.7°F | Resp 18 | Ht 63.0 in | Wt 151.8 lb

## 2015-02-23 DIAGNOSIS — C439 Malignant melanoma of skin, unspecified: Secondary | ICD-10-CM

## 2015-02-23 DIAGNOSIS — I6320 Cerebral infarction due to unspecified occlusion or stenosis of unspecified precerebral arteries: Secondary | ICD-10-CM

## 2015-02-23 DIAGNOSIS — C799 Secondary malignant neoplasm of unspecified site: Secondary | ICD-10-CM

## 2015-02-23 LAB — COMPREHENSIVE METABOLIC PANEL
ALT: 42 U/L (ref 0–55)
AST: 35 U/L — ABNORMAL HIGH (ref 5–34)
Albumin: 4.7 g/dL (ref 3.5–5.0)
Alkaline Phosphatase: 157 U/L — ABNORMAL HIGH (ref 40–150)
Anion Gap: 11 mEq/L (ref 3–11)
BUN: 17.3 mg/dL (ref 7.0–26.0)
CO2: 27 mEq/L (ref 22–29)
Calcium: 9.8 mg/dL (ref 8.4–10.4)
Chloride: 103 mEq/L (ref 98–109)
Creatinine: 0.9 mg/dL (ref 0.6–1.1)
EGFR: 76 mL/min/{1.73_m2} — ABNORMAL LOW (ref >90)
Glucose: 89 mg/dl (ref 70–140)
Potassium: 3.7 mEq/L (ref 3.5–5.1)
Sodium: 141 mEq/L (ref 136–145)
Total Bilirubin: 0.47 mg/dL (ref 0.20–1.20)
Total Protein: 8.2 g/dL (ref 6.4–8.3)

## 2015-02-23 LAB — CBC WITH DIFFERENTIAL/PLATELET
BASO%: 0.9 % (ref 0.0–2.0)
Basophils Absolute: 0 10*3/uL (ref 0.0–0.1)
EOS%: 1.3 % (ref 0.0–7.0)
Eosinophils Absolute: 0.1 10*3/uL (ref 0.0–0.5)
HCT: 43 % (ref 34.8–46.6)
HGB: 14.8 g/dL (ref 11.6–15.9)
LYMPH%: 26.3 % (ref 14.0–49.7)
MCH: 29.4 pg (ref 25.1–34.0)
MCHC: 34.3 g/dL (ref 31.5–36.0)
MCV: 85.7 fL (ref 79.5–101.0)
MONO#: 0.7 10*3/uL (ref 0.1–0.9)
MONO%: 12.9 % (ref 0.0–14.0)
NEUT#: 3.2 10*3/uL (ref 1.5–6.5)
NEUT%: 58.6 % (ref 38.4–76.8)
Platelets: 266 10*3/uL (ref 145–400)
RBC: 5.02 10*6/uL (ref 3.70–5.45)
RDW: 12.8 % (ref 11.2–14.5)
WBC: 5.4 10*3/uL (ref 3.9–10.3)
lymph#: 1.4 10*3/uL (ref 0.9–3.3)

## 2015-02-23 LAB — LACTATE DEHYDROGENASE: LDH: 292 U/L — ABNORMAL HIGH (ref 125–245)

## 2015-02-23 NOTE — Telephone Encounter (Signed)
ok 

## 2015-02-23 NOTE — Telephone Encounter (Signed)
LM on Vmail to CB to schedule appt with Dr. Hilarie Fredrickson

## 2015-02-23 NOTE — Telephone Encounter (Signed)
Patient is being referred by her oncologist for metastatic melanoma. She had a pancreatitis and colitis flare up. Dr. Hilarie Fredrickson first available is in February and first available AM appt with PA is end of January. Is it ok for patient to wait that long? Please advise as to scheduling.

## 2015-02-23 NOTE — Telephone Encounter (Signed)
Appointment scheduled.

## 2015-02-23 NOTE — Telephone Encounter (Signed)
Gave patient avs report and appointments for March. Spoke with Alyse Low re appointment with Dr. Hilarie Fredrickson (former Dr. Olevia Perches patient). Per Alyse Low Dr. Hilarie Fredrickson is not taking any patients at this time and she would send a message to Dr. Hilarie Fredrickson re Dr. Benay Spice requesting him specifically to see if he would agree to see patient. Alyse Low also asked to include patient hx/dx as per patient this is the reason Dr. Benay Spice requested Dr. Hilarie Fredrickson. Office will contact patient re appointment. Patient aware.

## 2015-02-23 NOTE — Progress Notes (Signed)
[place] OFFICE PROGRESS NOTE   Diagnosis: Melanoma  INTERVAL HISTORY:   She returns as scheduled. She reports feeling "dizzy "just before she is due for a dose of Keppra. She had a nodular lesion removed from a right toe last week. She is waiting on the pathology report.  Restaging CTs at [facility] on 02/08/2015 showed no evidence of disease progression. She is scheduled for a brain MRI tomorrow.  Objective:  Vital signs in last 24 hours:  Blood pressure 110/82, pulse 73, temperature 97.7 F (36.5 C), temperature source Oral, resp. rate 18, height 5' 3"  (1.6 m), weight 151 lb 12.8 oz (68.856 kg), SpO2 99 %.    HEENT: Neck without mass Lymphatics: No cervical, supra-clavicular, or axillary nodes Resp: Lungs clear bilaterally Cardio: Regular rate and rhythm GI: No hepatomegaly Vascular: No leg edema Neuro: Alert and oriented, the motor exam appears grossly intact  Skin: Right upper arm scar without evidence of recurrent tumor     Lab Results:  Lab Results  Component Value Date   WBC 5.4 02/23/2015   HGB 14.8 02/23/2015   HCT 43.0 02/23/2015   MCV 85.7 02/23/2015   PLT 266 02/23/2015   NEUTROABS 3.2 02/23/2015      Lab Results  Component Value Date   CEA 0.8 02/13/2008    Medications: I have reviewed the patient's current medications.  Assessment/Plan: 1.Metastatic melanoma  -Status post resection of a right arm melanoma, stage II A (T2b N0) in December 2009  -Metastatic melanoma, status post resection of a left cerebellar metastasis 09/30/2012, BRAF mutation identified  -Status post SRS treatment of the left cerebellum and a left parietal metastasis 10/24/2012  -Staging CTs and PET scan without evidence of distant metastatic disease aside from indeterminate small lung nodules  -Restaging CTs 12/01/2012 consistent with enlargement of the left parietal mass and right lower lobe lung nodule  -Initiation of dabrafenib and tremetinib 12/17/2012.  She reports extremely poor tolerance. Discontinued.  -Status post resection of a left parietal metastasis 02/09/2013.  -Restaging CT evaluation 03/03/2013. Stable 1.5 cm right lower lobe pulmonary nodule. Multiple peripheral enhancing lesions throughout the liver.  -Initiation of Ipilimumab at [facility] 03/10/2013.  -Cycle 2 Ipilumumab 04/17/2013  -Cycle 3 Ipilumumab 05/14/2013  -Cycle 4 Ipilumumab 06/04/2013  -Restaging CT at [facility] 06/25/2013 with multiple subcentimeter liver lesions, no new lesions, decreased right lower lobe nodule.  -Brain MRI 07/07/2013 with no evidence of progressive metastatic disease.  -Brain CT 10/09/2013 with no evidence of progressive metastatic disease  -Brain CT and CTs of the chest, abdomen, and pelvis at [facility] on 11/03/2013 with no evidence of progressive disease  -CTs of the brain, chest, abdomen, and pelvis at [facility] on 02/02/2014 with no evidence of disease progression -CTs of the brain, chest, abdomen, and pelvis at [facility] on 05/04/2014-no evidence of disease progression -CTs of the brain, chest, abdomen, and pelvis at [facility] on 08/03/2014-stable -CTs of the brain, chest, abdomen, and pelvis at [facility] on 11/02/2014-stable -CTs of the brain, chest, abdomen, and pelvis at [facility] on 02/08/2015-stable 2. Arrhythmia, pacemaker in place.  3. Hypertension  4. History of a CVA  5. History of deep vein thrombosis  6. History of proximal leg weakness-likely related to chronic steroid use.  7. Intermittent rash at the posterior thighs and left hand-likely related to systemic therapy.  8. Leg edema-? Related to liver disease,? Secondary to ipilumumab . Improved on furosemide.  9. Headache and mild nausea-coincided with discontinuation of Decadron, CT of the brain 03/25/2011  with no acute change.  10. 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 History of Mild elevation of lipase-likely mild asymptomatic pancreatitis related to  Ipilumumab  12. Red cell microcytosis and iron deficiency 03/17/2014-persistent borderline low iron stores and Red cell microcytosis, negative stool Hemoccults, improved       Disposition:  She remains in clinical remission from melanoma. She will undergo a restaging brain MRI on 02/24/2015.  She continues follow-up at [facility]. She will return for an office visit here in 3 months. We will see her in the interim as needed.  Betsy Coder, MD  02/23/2015  3:03 PM

## 2015-02-23 NOTE — Telephone Encounter (Signed)
Can use an afternoon in January with an APP

## 2015-02-24 ENCOUNTER — Ambulatory Visit (HOSPITAL_COMMUNITY)
Admission: RE | Admit: 2015-02-24 | Discharge: 2015-02-24 | Disposition: A | Discharge status: Home / Self Care | Payer: Medicare Other | Source: Ambulatory Visit | Attending: Radiation Oncology | Admitting: Radiation Oncology

## 2015-02-24 DIAGNOSIS — C7931 Secondary malignant neoplasm of brain: Secondary | ICD-10-CM | POA: Diagnosis not present

## 2015-02-24 DIAGNOSIS — C7949 Secondary malignant neoplasm of other parts of nervous system: Secondary | ICD-10-CM | POA: Diagnosis not present

## 2015-02-24 DIAGNOSIS — C439 Malignant melanoma of skin, unspecified: Secondary | ICD-10-CM | POA: Diagnosis not present

## 2015-02-24 MED ORDER — GADOBENATE DIMEGLUMINE 529 MG/ML IV SOLN
15.0000 mL | Freq: Once | INTRAVENOUS | Status: AC | PRN
  Administered 2015-02-24: 15 mL via INTRAVENOUS

## 2015-02-25 ENCOUNTER — Telehealth: Payer: Self-pay | Admitting: *Deleted

## 2015-02-25 NOTE — Telephone Encounter (Signed)
Message from [facility] in [place] reporting pt has received MRI results, no need to call pt.

## 2015-02-26 ENCOUNTER — Other Ambulatory Visit: Payer: Self-pay | Admitting: Oncology

## 2015-03-02 ENCOUNTER — Encounter: Payer: Self-pay | Admitting: Physician Assistant

## 2015-03-02 ENCOUNTER — Encounter (HOSPITAL_COMMUNITY): Payer: Self-pay

## 2015-03-02 ENCOUNTER — Encounter: Payer: Self-pay | Admitting: Radiation Oncology

## 2015-03-02 ENCOUNTER — Ambulatory Visit
Admission: RE | Admit: 2015-03-02 | Discharge: 2015-03-02 | Disposition: A | Discharge status: Home / Self Care | Payer: BLUE CROSS/BLUE SHIELD | Source: Ambulatory Visit | Attending: Radiation Oncology | Admitting: Radiation Oncology

## 2015-03-02 ENCOUNTER — Ambulatory Visit (INDEPENDENT_AMBULATORY_CARE_PROVIDER_SITE_OTHER): Payer: Medicare Other | Admitting: Physician Assistant

## 2015-03-02 VITALS — BP 100/68 | HR 76 | Ht 63.0 in | Wt 149.0 lb

## 2015-03-02 VITALS — BP 100/68 | HR 76 | Temp 97.6°F | Resp 16 | Ht 63.0 in | Wt 149.1 lb

## 2015-03-02 DIAGNOSIS — C7931 Secondary malignant neoplasm of brain: Secondary | ICD-10-CM

## 2015-03-02 DIAGNOSIS — K5730 Diverticulosis of large intestine without perforation or abscess without bleeding: Secondary | ICD-10-CM | POA: Diagnosis not present

## 2015-03-02 DIAGNOSIS — Z85820 Personal history of malignant melanoma of skin: Secondary | ICD-10-CM

## 2015-03-02 DIAGNOSIS — K589 Irritable bowel syndrome without diarrhea: Secondary | ICD-10-CM

## 2015-03-02 NOTE — Patient Instructions (Addendum)
Please read and review the low fat and high fiber diet information we have given you today.  Start using Benefiber 1 heaping TBSP in 6oz of water every other day, alternating with Miralax.  Follow up with Dr Hilarie Fredrickson in May 2017

## 2015-03-02 NOTE — Progress Notes (Signed)
Radiation Oncology         [phone] ________________________________  Name: [name] MRN: [id number]  Date: 03/02/2015  DOB: [birth year]  Follow-Up Visit Note  CC: [name], MD  [name], MD  Diagnosis:   Metastatic melanoma with brain metastasis  Interval Since Last Radiation:  10/24/2012  Site/dose:    1. Left cerebellar target was treated using 6 Arcs to a prescription dose of 15 Gy. ExacTrac Snap verification was performed for each couch angle.  2. Left parietal target was treated using 3 Arcs to a prescription dose of 20 Gy. ExacTrac Snap verification was performed for each couch angle.  Narrative:  The patient returns today for routine follow-up. Brain MRI on 02/24/15 shows enhancement in the left cerebellum and medial left parietal region remaining stable. She had a headache last night. She takes Keppra '500mg'$  2x day. She notices 45 min before taking the next dose that she gets chills, gets shaky, and has headaches. Scheduled for an EEG on 03/04/15, just left [facility] today. She reports a "sketchy"appetite sketchy. Bowels are good and the bladder is okay.  Recent [facility] imaging results are as follows: CT Abdomen Pelvis W Contrast (02/08/2015 11:51 AM)  Narrative  EXAM: CT Chest, Abdomen & Pelvis with contrast   DATE: 02/08/15 11:51:00  ACCESSION: [id number]  DICTATED: 02/08/15 11:56:28  INTERPRETATION LOCATION: [place]    CLINICAL INDICATION: 60 Year Old (F): C43.9 - Melanoma (RAF-HCC)C43.9 - Melanoma (RAF-HCC). OTHER    COMPARISON: 11/02/2014    TECHNIQUE: A spiral CT scan was obtained with IV contrast from the thoracic inlet through the pubic symphysis. Images were reconstructed in the axial plane.Coronal and sagittal reformatted images were also provided for further evaluation.    FINDINGS:   CHEST: Pacemaker remains in place. Surgical clips in the right axilla. Nonenlarged axillary nodes  bilaterally. No mediastinal, hilar, or axillary lymphadenopathy. No pleural or pericardial effusions. Stable moderate hiatal hernia with a small paraesophageal component.    No suspicious pulmonary findings. Degenerative changes in the thoracic spine.    ABDOMEN and PELVIS: Ill-defined hypodense area in hepatic segment 4 is unchanged. No new or suspicious hepatic lesions. The spleen, pancreas, gallbladder and adrenals are unremarkable.    Symmetric nephrograms without obstruction or nephrolithiasis. The bladder and uterus are grossly unremarkable.    No bowel obstruction or focal inflammation. Scattered colonic diverticula.    No retroperitoneal or pelvic.    Degenerative changes in the lumbar spine.    IMPRESSION:  No significant change. No new or enlarging sites of disease.     ALLERGIES:  is allergic to doxycycline; erythromycin; penicillins; preparation h; promethazine; sulfonamide derivatives; and phenergan.  Meds: Current Outpatient Prescriptions  Medication Sig Dispense Refill  . acetaminophen (TYLENOL) 500 MG tablet Take 1,000 mg by mouth every 6 (six) hours as needed for pain.    Marland Kitchen albuterol (VENTOLIN HFA) 108 (90 BASE) MCG/ACT inhaler Inhale 2 puffs into the lungs as needed.    . bimatoprost (LATISSE) 0.03 % ophthalmic solution Apply 1 application topically as needed.     . clonazePAM (KLONOPIN) 0.5 MG tablet Take 1 tablet (0.5 mg total) by mouth 3 (three) times daily as needed for anxiety. 50 tablet 0  . cloNIDine (CATAPRES) 0.1 MG tablet TAKE ONE TABLET BY MOUTH AT BEDTIME  90 tablet 3  . cyanocobalamin (,VITAMIN B-12,) 1000 MCG/ML injection ADMINISTER 1 ML UNDER THE SKIN 1 TIME A MONTH AS DIRECTED 1 mL 0  . cyanocobalamin (,VITAMIN B-12,)  1000 MCG/ML injection ADMINISTER 1 ML UNDER THE SKIN 1 TIME A MONTH AS DIRECTED 1 mL 11  . diphenhydrAMINE (BENADRYL) 25 MG tablet Take 12.5 mg by mouth every 6 (six) hours as needed. Seasonal allergies    . esomeprazole  (NEXIUM) 40 MG capsule Take 1 capsule (40 mg total) by mouth 2 (two) times daily before a meal. 60 capsule 2  . ferrous sulfate 325 (65 FE) MG EC tablet Take 1 tablet (325 mg total) by mouth daily with breakfast.  3  . furosemide (LASIX) 40 MG tablet Take 20 mg by mouth daily. Takes PRN    . hydrocortisone (ANUSOL-HC) 25 MG suppository Place 25 mg rectally at bedtime as needed for hemorrhoids.    . hydrocortisone 2.5 % cream Apply 1 application topically 2 (two) times daily as needed (irritation).     Marland Kitchen levETIRAcetam (KEPPRA) 500 MG tablet Take 500 mg by mouth 2 (two) times daily.     . mesalamine (CANASA) 1000 MG suppository Place 1,000 mg rectally at bedtime as needed (hemorrhoids).     . metFORMIN (GLUCOPHAGE) 500 MG tablet Take 500 mg by mouth daily after breakfast.    . metoprolol tartrate (LOPRESSOR) 25 MG tablet Take 1 tablet (25 mg total) by mouth 2 (two) times daily. 180 tablet 0  . mometasone (NASONEX) 50 MCG/ACT nasal spray Place 2 sprays into the nose daily.    Marland Kitchen nystatin (MYCOSTATIN) 100000 UNIT/ML suspension Take 5 mLs (500,000 Units total) by mouth 4 (four) times daily. (Patient taking differently: Take 5 mLs by mouth as needed. ) 60 mL 0  . ondansetron (ZOFRAN-ODT) 4 MG disintegrating tablet Take 4 mg by mouth as needed. '4mg'$  ODT q4 hours prn nausea/vomit    . polyethylene glycol powder (GLYCOLAX/MIRALAX) powder Stir 17 grams in 8oz of water, or juice, 1 or 2 times daily for constipation PRN    . Potassium Chloride ER 20 MEQ TBCR Take 20 mEq by mouth 2 (two) times daily. 60 tablet 1  . potassium chloride SA (K-DUR,KLOR-CON) 20 MEQ tablet TAKE ONE TABLET BY MOUTH TWICE DAILY 60 tablet 3  . Vitamin D, Ergocalciferol, (DRISDOL) 50000 UNITS CAPS capsule Take 50,000 Units by mouth every 7 (seven) days.     No current facility-administered medications for this encounter.    Physical Findings: The patient is in no acute distress. Patient is alert and oriented.  height is '5\' 3"'$  (1.6 m)  and weight is 149 lb 1.6 oz (67.631 kg). Her oral temperature is 97.6 F (36.4 C). Her blood pressure is 100/68 and her pulse is 76. Her respiration is 16.    Alert and oriented x3. In no distress.  Lab Findings: Lab Results  Component Value Date   WBC 5.4 02/23/2015   HGB 14.8 02/23/2015   HCT 43.0 02/23/2015   MCV 85.7 02/23/2015   PLT 266 02/23/2015     Radiographic Findings: Mr Jeri Cos FK Contrast  02/24/2015  CLINICAL DATA:  Metastatic melanoma treated with previous resections. Follow-up. EXAM: MRI HEAD WITHOUT AND WITH CONTRAST TECHNIQUE: Multiplanar, multiecho pulse sequences of the brain and surrounding structures were obtained without and with intravenous contrast. CONTRAST:  46m MULTIHANCE GADOBENATE DIMEGLUMINE 529 MG/ML IV SOLN COMPARISON:  06/22/2014.  07/07/2013.  02/20/2013. FINDINGS: During the examination, the patient was monitored by cardiology, Dr. KCaryl Comes Diffusion imaging does not show any acute or subacute stroke or any other cause of restricted diffusion. There has been previous left occipital craniotomy for resection of a cerebellar metastasis. 4  mm focus of enhancement adjacent to the resection site has not changed since the previous study, I arguing against this representing tumor. No increase in gliosis or edema. Enhancement at the site of the left parietal resection is also stable, measuring approximately 6 x 7 mm. No increased edema or gliosis in that region. No newly seen foci of enhancement to suggest new metastatic lesions. No skull or skullbase lesion. Probable hemangioma in the right frontal region appears the same. No hydrocephalus or extra-axial collection. No ischemic changes. Sinuses are clear. IMPRESSION: Stable examination. Enhancement in the left cerebellum adjacent to the resection site is unchanged, arguing against this representing viable tumor. Enhancement in the medial left parietal region is stable and consistent with postoperative change.  Electronically Signed   By: Nelson Chimes M.D.   On: 02/24/2015 19:58   Ct Outside Films Chest  02/16/2015  CLINICAL DATA:  This exam is stored here for comparison purposes only and was performed at an outside facility.   Please contact the originating institution for any associated interpretation or report.   Ct Outside Films Head/face  02/16/2015  CLINICAL DATA:  This exam is stored here for comparison purposes only and was performed at an outside facility.   Please contact the originating institution for any associated interpretation or report.   Ct Outside Films Body  02/16/2015  CLINICAL DATA:  This exam is stored here for comparison purposes only and was performed at an outside facility.   Please contact the originating institution for any associated interpretation or report.   Impression: The patient clinically is doing very well. The patient is doing well systemically based on CT imaging, and recent brain MRI scan was stable and showed no evidence of progression.  Plan:  She has a follow up with Dr. Benay Spice on 05/27/15. Continuing followup at [facility] She has continued follow-up with Dr. Ellene Route who has ordered an EEG that will be completed on 1/6.   I spent 20 minutes with the patient today, the majority of which was spent counseling the patient on the diagnosis of cancer and coordinating care.  ------------------------------------------------  Jodelle Gross, MD, PhD  This document serves as a record of services personally performed by Kyung Rudd, MD. It was created on his behalf by Darcus Austin, a trained medical scribe. The creation of this record is based on the scribe's personal observations and the provider's statements to them. This document has been checked and approved by the attending provider.

## 2015-03-02 NOTE — Progress Notes (Signed)
Follow up   S/p  SRS brian , Had MRI 02/24/15, hre for results, had a head ahe last night, takes keppra 15mg 2x day, notices 45 min before taking the next dose, gets chills, shaky, and h/a,  , none today, [facility] EEG [facility]04[facility]15[facility]  Appetite sketchy still, "I eat to lice", bowels are good, bladder okay 3:45 PM BP 100/68 mmHg  Pulse 76  Temp(Src) 97.6 F (36.4 C) (Oral)  Resp 16  Ht '5\' 3"'$  (1.6 m)  Wt 149 lb 1.6 oz (67.631 kg)  BMI 26.42 kg/m2  Wt Readings from Last 3 Encounters:  03/02/15 149 lb 1.6 oz (67.631 kg)  03/02/15 149 lb (67.586 kg)  02/23/15 151 lb 12.8 oz (68.856 kg)

## 2015-03-02 NOTE — Progress Notes (Addendum)
Patient ID: [name], female   DOB: Apr 14, 1955, 60 y.o.   MRN: [id number]     History of Present Illness: [name] is a delightful 60 year old female who was followed by Dr. Delfin Edis in the past. She was last seen by Dr. Delfin Edis in June 2014. She has a history of pots syndrome, symptomatic diverticular disease, diverticulitis. Last full colonoscopy was in June 2008 which showed moderately severe diverticulosis. She was advised to have surveillance in 10 years.  She has a history of metastatic melanoma and is status post resection of a right arm melanoma in December 2009. She then had a resection of the left cerebellar metastasis in August 2014. She had SRS treatment of the left cerebellum and a left parietal metastasis in August 2014.restaging CTs in October 2014 were consistent with enlargement of the left parietal mass and right lower lobe lung nodule. She then had resection of the left parietal metastasis in December 2014. Restaging CT in January 2015 showed a stable 1.5 cm right lower lobe pulmonary nodule and multiple peripheral enhancing lesions throughout the liver. She had initiation of Ipilimumab at [facility] in January 2015 and had her fourth cycle in April 2015. Restaging CT in April 2015 had multiple subcentimeter liver lesions, no new lesions, decreased right lower lobe nodule. Brain MRI in May 2015 had no evidence of progressive metastatic disease. Brain CT August 2015 had no evidence of metastatic disease.CTs of the brain chest abdomen and pelvis at [facility] in June 2016, September 2016, and December 2016 have been stable. She has a history of an arrhythmia and has a pacemaker in place. 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 She  also has a history of red cell microcytosis and iron deficiency in January 2016 with persistent borderline low iron studies and red cell microcytosis, and negative stool Hemoccults. She has treated her iron deficiency with natural molasses and states her ferritin has normalized. In 2016, due to her low ferritin she was advised to have a repeat colonoscopy. She states her recent blood work at [facility] showed that her ferritin had normalized and she was told she did not need the colonoscopy as long as her ferritin remained in the normal range. She is having this followed regularly at [facility].  She is here today because she is interested in establishing care with Dr. Billie Lade as her gastroenterologist. She has no complaints at this time, but states she wanted to "B in the system" so that if she experienced recurrent abdominal pain, diverticulitis, etc. she has a gastroenterologist to go to.   Past Medical History  Diagnosis Date  . 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 Hypoglycemia, unspecified   . Other nonspecific abnormal serum enzyme levels   . Cervicalgia   . Disturbance of skin sensation   . Other specified congenital anomalies of nervous system   . Swelling of limb   . Disorder of bone and cartilage, unspecified   . Atrial tachycardia ([place])   . CVA (cerebral infarction)     2012 with dizziness and vision change felt embolic from atrial tachy  . History of pacemaker   . POTS (postural orthostatic tachycardia syndrome)   . Diverticulosis   . Osteopenia   . DVT (  deep venous thrombosis) ([place])   . Atrial fibrillation ([place])   . DM (diabetes mellitus) ([place])   . DVT (deep venous thrombosis) ([place])   . HLD (hyperlipidemia)   . HTN (hypertension)   . Pacemaker     autonomic dysfunction  . Complication of anesthesia     pt states wakes up with "shakes"  . PONV (postoperative nausea and vomiting)   . Stroke [facility])     left weaker   . History of radiation therapy 10/24/12     brain  . Skin cancer     Hx: of lung lesion  . Family history of anesthesia complication     PONV  . Asthma     Past Surgical History  Procedure Laterality Date  . Insert / replace / remove pacemaker  2012    1999, x 3  . Ablation saphenous vein w/ rfa      2002  . Tonsillectomy and adenoidectomy    . Carpal tunnel release Left   . Nose surgery  2010, 2012    for nose bleeds x 2  . Fatty tumor removed      from chest  . Melanoma excision      with removal of lymph nodes, left shoulder  . Deep axillary sentinel node biopsy / excision      due to extensive Melanoma-right arm  . Laparoscopy  09/06/2011    Procedure: LAPAROSCOPY OPERATIVE;  Surgeon: Claiborne Billings A. Pamala Hurry, MD;  Location: [facility];  Service: Gynecology;  Laterality: Left;  with Left Ovarian Cystectomy   . Craniotomy N/A 09/30/2012    suboccipital craniectomy  . Craniotomy Left 02/09/2013    Procedure: LEFT PARIETAL CRANIOTOMY with stealth;  Surgeon: [name], MD;  Location: [facility];  Service: Neurosurgery;  Laterality: Left;  LEFT Parietal Craniotomy for tumor with stealth   Family History  Problem Relation Age of Onset  . Heart disease Father   . Diabetes Maternal Grandmother   . Diabetes Paternal Grandmother   . Stroke Mother   . Colon polyps Mother   . Clotting disorder Maternal Grandmother     stroke  . Crohn's disease Maternal Grandmother   . Diabetes Father   . Kidney disease Father   . Aneurysm Sister     brain   Social History  Substance Use Topics  . Smoking status: Never Smoker   . Smokeless tobacco: Never Used  . Alcohol Use: No     Comment: glass of wine daily   Current Outpatient Prescriptions  Medication Sig Dispense Refill  . acetaminophen (TYLENOL) 500 MG tablet Take 1,000 mg by mouth every 6 (six) hours as needed for pain.    Marland Kitchen albuterol (VENTOLIN HFA) 108 (90 BASE) MCG/ACT inhaler Inhale 2 puffs into the lungs as needed.    . bimatoprost (LATISSE) 0.03 % ophthalmic solution Apply  1 application topically as needed.     . clonazePAM (KLONOPIN) 0.5 MG tablet Take 1 tablet (0.5 mg total) by mouth 3 (three) times daily as needed for anxiety. 50 tablet 0  . cloNIDine (CATAPRES) 0.1 MG tablet TAKE ONE TABLET BY MOUTH AT BEDTIME  90 tablet 3  . cyanocobalamin (,VITAMIN B-12,) 1000 MCG/ML injection ADMINISTER 1 ML UNDER THE SKIN 1 TIME A MONTH AS DIRECTED 1 mL 0  . cyanocobalamin (,VITAMIN B-12,) 1000 MCG/ML injection ADMINISTER 1 ML UNDER THE SKIN 1 TIME A MONTH AS DIRECTED 1 mL 11  . diphenhydrAMINE (BENADRYL) 25 MG tablet Take 12.5 mg by mouth every  6 (six) hours as needed. Seasonal allergies    . esomeprazole (NEXIUM) 40 MG capsule Take 1 capsule (40 mg total) by mouth 2 (two) times daily before a meal. 60 capsule 2  . ferrous sulfate 325 (65 FE) MG EC tablet Take 1 tablet (325 mg total) by mouth daily with breakfast.  3  . furosemide (LASIX) 40 MG tablet Take 20 mg by mouth daily. Takes PRN    . hydrocortisone (ANUSOL-HC) 25 MG suppository Place 25 mg rectally at bedtime as needed for hemorrhoids.    . hydrocortisone 2.5 % cream Apply 1 application topically 2 (two) times daily as needed (irritation).     Marland Kitchen levETIRAcetam (KEPPRA) 500 MG tablet Take 500 mg by mouth 2 (two) times daily.     . mesalamine (CANASA) 1000 MG suppository Place 1,000 mg rectally at bedtime as needed (hemorrhoids).     . metFORMIN (GLUCOPHAGE) 500 MG tablet Take 500 mg by mouth daily after breakfast.    . metoprolol tartrate (LOPRESSOR) 25 MG tablet Take 1 tablet (25 mg total) by mouth 2 (two) times daily. 180 tablet 0  . mometasone (NASONEX) 50 MCG/ACT nasal spray Place 2 sprays into the nose daily.    Marland Kitchen nystatin (MYCOSTATIN) 100000 UNIT/ML suspension Take 5 mLs (500,000 Units total) by mouth 4 (four) times daily. (Patient taking differently: Take 5 mLs by mouth as needed. ) 60 mL 0  . ondansetron (ZOFRAN-ODT) 4 MG disintegrating tablet Take 4 mg by mouth as needed. '4mg'$  ODT q4 hours prn nausea/vomit      . polyethylene glycol powder (GLYCOLAX/MIRALAX) powder Stir 17 grams in 8oz of water, or juice, 1 or 2 times daily for constipation PRN    . Potassium Chloride ER 20 MEQ TBCR Take 20 mEq by mouth 2 (two) times daily. 60 tablet 1  . potassium chloride SA (K-DUR,KLOR-CON) 20 MEQ tablet TAKE ONE TABLET BY MOUTH TWICE DAILY 60 tablet 3  . Vitamin D, Ergocalciferol, (DRISDOL) 50000 UNITS CAPS capsule Take 50,000 Units by mouth every 7 (seven) days.     No current facility-administered medications for this visit.   Allergies  Allergen Reactions  . Doxycycline Hives  . Erythromycin Hives  . Penicillins Hives  . Preparation H [Pramox-Pe-Glycerin-Petrolatum] Hives    Cannot use  Cream or Suppositories   . Promethazine     Other reaction(s): Other (See Comments) Causes restless leg syndrome  . Sulfonamide Derivatives Hives  . Phenergan [Promethazine Hcl] Other (See Comments)    Causes restless leg syndrome     Review of Systems: Gen: Denies any fever, chills, sweats, anorexia, fatigue, weakness, malaise, weight loss, and sleep disorder CV: Denies chest pain, angina, palpitations, syncope, orthopnea, PND, peripheral edema, and claudication. Resp: Denies dyspnea at rest, dyspnea with exercise, cough, sputum, wheezing, coughing up blood, and pleurisy. GI: Denies vomiting blood, jaundice, and fecal incontinence.   Denies dysphagia or odynophagia. GU : Denies urinary burning, blood in urine, urinary frequency, urinary hesitancy, nocturnal urination, and urinary incontinence. MS: Denies joint pain, limitation of movement, and swelling, stiffness, low back pain, extremity pain. Denies muscle weakness, cramps, atrophy.  Derm: Denies rash, itching, dry skin, hives, moles, warts, or unhealing ulcers.  Psych: Denies depression, anxiety, memory loss, suicidal ideation, hallucinations, paranoia, and confusion. Heme: Denies bruising, bleeding, and enlarged lymph nodes. Neuro:  Denies any headaches,  dizziness, paresthesia Endo:  Denies any problems with DM, thyroid, adrenal   Mr Kizzie Fantasia Contrast  02/24/2015  CLINICAL DATA:  Metastatic melanoma treated  with previous resections. Follow-up. EXAM: MRI HEAD WITHOUT AND WITH CONTRAST TECHNIQUE: Multiplanar, multiecho pulse sequences of the brain and surrounding structures were obtained without and with intravenous contrast. CONTRAST:  77m MULTIHANCE GADOBENATE DIMEGLUMINE 529 MG/ML IV SOLN COMPARISON:  06/22/2014.  07/07/2013.  02/20/2013. FINDINGS: During the examination, the patient was monitored by cardiology, Dr. KCaryl Comes Diffusion imaging does not show any acute or subacute stroke or any other cause of restricted diffusion. There has been previous left occipital craniotomy for resection of a cerebellar metastasis. 4 mm focus of enhancement adjacent to the resection site has not changed since the previous study, I arguing against this representing tumor. No increase in gliosis or edema. Enhancement at the site of the left parietal resection is also stable, measuring approximately 6 x 7 mm. No increased edema or gliosis in that region. No newly seen foci of enhancement to suggest new metastatic lesions. No skull or skullbase lesion. Probable hemangioma in the right frontal region appears the same. No hydrocephalus or extra-axial collection. No ischemic changes. Sinuses are clear. IMPRESSION: Stable examination. Enhancement in the left cerebellum adjacent to the resection site is unchanged, arguing against this representing viable tumor. Enhancement in the medial left parietal region is stable and consistent with postoperative change. Electronically Signed   By: MNelson ChimesM.D.   On: 02/24/2015 19:58   Ct Outside Films Chest  02/16/2015  CLINICAL DATA:  This exam is stored here for comparison purposes only and was performed at an outside facility.   Please contact the originating institution for any associated interpretation or report.   Ct Outside  Films Head/face  02/16/2015  CLINICAL DATA:  This exam is stored here for comparison purposes only and was performed at an outside facility.   Please contact the originating institution for any associated interpretation or report.   Ct Outside Films Body  02/16/2015  CLINICAL DATA:  This exam is stored here for comparison purposes only and was performed at an outside facility.   Please contact the originating institution for any associated interpretation or report.     Physical Exam: BP 100/68 mmHg  Pulse 76  Ht '5\' 3"'$  (1.6 m)  Wt 149 lb (67.586 kg)  BMI 26.40 kg/m2 General: Pleasant, well developed , female in no acute distress Head: Normocephalic and atraumatic Eyes:  sclerae anicteric, conjunctiva pink  Ears: Normal auditory acuity Lungs: Clear throughout to auscultation Heart: Regular rate and rhythm Abdomen: Soft, non distended, non-tender. No masses, no hepatomegaly. Normal bowel sounds Musculoskeletal: Symmetrical with no gross deformities  Extremities: No edema  Neurological: Alert oriented x 4, grossly nonfocal Psychological:  Alert and cooperative. Normal mood and affect  Assessment and Recommendations: 60year old female with a history of diverticulosis, and metastatic melanoma treated with immunotherapy, here to establish care. An extensive amount of time was spent with the patient and her husband reviewing a high-fiber, low-fat diet. She voiced multiple concerns regarding food choices, and her questions were all reviewed. She would like to return in for 5 months to meet with Dr. PHilarie Fredricksonand keep regular communication with the office. [facility] and she states she will contact [place] this is again low, at which time she would schedule colonoscopy. Due to her pots syndrome, the patient would need to be admitted for a prep and have her colonoscopy performed as an inpatient.  Nydia Ytuarte, LVita BarleyPA-C 03/02/2015,  Addendum: Reviewed  and agree with initial management. JLajuan Lines  Pyrtle, MD

## 2015-03-03 ENCOUNTER — Encounter: Payer: Self-pay | Admitting: Radiation Oncology

## 2015-03-04 ENCOUNTER — Ambulatory Visit (INDEPENDENT_AMBULATORY_CARE_PROVIDER_SITE_OTHER): Payer: Medicare Other | Admitting: Neurology

## 2015-03-04 DIAGNOSIS — R55 Syncope and collapse: Secondary | ICD-10-CM

## 2015-03-04 DIAGNOSIS — R42 Dizziness and giddiness: Secondary | ICD-10-CM

## 2015-03-04 NOTE — Procedures (Signed)
    History:  [name] is a 60 year old patient with a history of metastatic melanoma, with resection from the left cerebellum and left parietal area. The patient is on Keppra, she reports some sensations of dizziness and jitteriness just before her next dose of Keppra. She is being evaluated for this issue.  This is a routine EEG. No skull defects are noted. Medications include albuterol, clonazepam, clonidine, vitamin B12, Nexium, iron supplementation, Lasix, Keppra, metformin, metoprolol, Nasonex, Zofran, MiraLAX, potassium supplementation, and vitamin D.   EEG classification: Normal awake  Description of the recording: The background rhythms of this recording consists of a fairly well modulated medium amplitude alpha rhythm of 10 Hz that is reactive to eye opening and closure. As the record progresses, the patient appears to remain in the waking state throughout the recording. Photic stimulation was performed, resulting in a bilateral and symmetric photic driving response. Hyperventilation was also performed, resulting in a minimal buildup of the background rhythm activities without significant slowing seen. At no time during the recording does there appear to be evidence of spike or spike wave discharges or evidence of focal slowing. EKG monitor shows no evidence of cardiac rhythm abnormalities with a heart rate of 60.  Impression: This is a normal EEG recording in the waking state. No evidence of ictal or interictal discharges are seen.

## 2015-03-07 ENCOUNTER — Ambulatory Visit: Payer: Self-pay | Admitting: Radiation Oncology

## 2015-03-09 DIAGNOSIS — C7931 Secondary malignant neoplasm of brain: Secondary | ICD-10-CM | POA: Diagnosis not present

## 2015-03-09 DIAGNOSIS — G9389 Other specified disorders of brain: Secondary | ICD-10-CM | POA: Diagnosis not present

## 2015-03-14 ENCOUNTER — Other Ambulatory Visit: Payer: Self-pay | Admitting: Internal Medicine

## 2015-03-16 ENCOUNTER — Telehealth: Payer: Self-pay | Admitting: Internal Medicine

## 2015-03-16 NOTE — Telephone Encounter (Signed)
Left message for patient to return phone call.  

## 2015-03-16 NOTE — Telephone Encounter (Signed)
Pt has not seen dr Regis Bill since 2012. Pt has been battling stage 4 cancer now in remission. Pt was taking immuno therapy drug yervoy. Pt is having cough and chest congestion and nasal congestion. [facility] [address]

## 2015-03-16 NOTE — Telephone Encounter (Signed)
Pt was called and transferred to team health

## 2015-03-16 NOTE — Telephone Encounter (Signed)
[facility] [place] Phone: [phone], Toll-Free: [phone], Fax: [phone]   Patient Name: [name]  Gender: Female  DOB: [birth year]  Age: 60 Y 2 M 15 D  Return Phone Number: [phone] (Primary), [phone] (Secondary)      Client: [facility]  Client Site: [facility]  Physician: [name]   Contact Type: Call  Call Type: Triage / Clinical  Caller Name:   Relationship To Patient: Self  Appointment Disposition EMR:   Info pasted into Epic:   Return Phone Number: [phone] (Primary)  Chief Complaint: Cold Symptom  Initial Comment: Caller states she has metastatic brain cancer. This week she has come down with the rhinovirus. She has a cough and sinus drainage. She has no fever yet. Oncologist told her to call pcp to treat cough and other symptoms. She is immune compromised from the cancer so she doesnt want to be seen in office or er. Would like something called in.   [place] Not Listed:   PreDisposition: Call Doctor  Translation:   [place] Lead ID:   Name of Procedure:   No Triage Reason:    Protocols   Nurse: Erskin Burnet Date/Time: 03/16/2015 2:38:40 PM  Triage Guideline: [facility]   Thank you for calling [place] ([facility]) . My name is Levada Dy. I am a Patient Coordinator. How can the nurse help you today?   -----Caller states she has metastatic brain cancer. This week she has come down with the rhinovirus. She has a cough and sinus drainage. She has no fever yet. Oncologist told her to call pcp to treat cough and other symptoms. She is immune compromised from the cancer so she doesnt want to be seen in office or er. Would like something called in.   Is this call being sent to demographics queue?   -----No   Who is calling?   -----Patient / Parent /  Guardian   Have you been seen or have appointment to be seen at [facility] ([facility]) ?   -----Yes   Please select one of the following:   -----Triage / Clinical   What is the patient's last name?   -----Theilen   What is the patient's first name?   -----[name]   What is the patient's date of birth?   -----Jun 28, 1955   Disposition: Clinical / Triage                   Nurse: Genoveva Ill RN, Lisa Date/Time: 03/16/2015 3:55:49 PM  Triage Guideline: Cough - Acute Productive   Severe difficulty breathing (e.g., struggling for each breath, speaks in single words)   -----No   Bluish lips, tongue, or face now   -----No   [1] Difficulty breathing AND [2] exposure to flames, smoke, or fumes   -----No   [1] Stridor AND [2] difficulty breathing   -----No   Sounds like a life-threatening emergency to the triager   -----No   [1] Previous asthma attacks AND [2] this feels like asthma attack   -----No   Dry (non-productive) cough (i.e., no sputum or minimal clear sputum)   -----No   Chest pain (Exception: MILD central chest pain, present only when coughing)   -----No   Difficulty breathing   -----No   Patient sounds very sick or weak to the triager   -----No   [1] Coughed up blood  AND [2] > 1 tablespoon (15 ml) (Exception: blood-tinged sputum)   -----No   Fever > 103 F (39.4 C)   -----No   [1] Fever > 101 F (38.3 C) AND [2] age > 39   -----No   [1] Fever > 101 F (38.3 C) AND [2] bedridden (e.g., nursing home patient, CVA, chronic illness, recovering from surgery)   -----No   [1] Fever > 100.5 F (38.1 C) AND [2] diabetes mellitus or weak immune system (e.g., HIV positive, cancer chemo, splenectomy, organ transplant, chronic steroids)   -----No   Wheezing is present   -----No   SEVERE coughing spells (e.g., whooping sound after coughing, vomiting after coughing)   -----No   [1] Continuous (nonstop) coughing interferes with work or school AND [2] no improvement using  cough treatment per Care Advice   -----No   Coughing up rusty-colored (reddish-brown) sputum   -----No   Fever present > 3 days (72 hours)   -----No   [1] Fever returns after gone for over 24 hours AND [2] symptoms worse or not improved   -----No   [1] Sinus pain (around cheekbone or eye) AND [2] present > 24 hours using nasal washes and pain meds   -----Yes   Disposition: See Physician within 24 Hours      SEE PHYSICIAN WITHIN 24 HOURS: * IF OFFICE WILL BE OPEN: You need to be seen within the next 24 hours. Call your doctor when the office opens, and make an appointment. * Introduction: Saline (salt water) nasal irrigation (nasal wash) is an effective and simple home remedy for treating stuffy nose and sinus congestion. The nose can be irrigated by pouring, spraying, or squirting salt water into the nose and then letting it run back out. * For pain relief, take acetaminophen, ibuprofen, or naproxen. * Before taking any medicine, read all the instructions on the package. * Cough drops are available over-the-counter (OTC). HOME REMEDY - HARD CANDY: Hard candy works just as well as a medicine-flavored OTC cough drops. * Drink warm fluids. Inhale warm mist. (Reason: both relax the airway and loosen up the phlegm) * Suck on cough drops or hard candy to coat the irritated throat. HUMIDIFIER: If the air is dry, use a humidifier in the bedroom. (Reason: dry air makes coughs worse) AVOID TOBACCO SMOKE: Smoking or being exposed to smoke makes coughs much worse. CALL BACK IF: * Difficulty breathing occurs * You become worse. CARE ADVICE given per Cough - Acute Productive (Adult) guideline.    Caller Understands: Yes  Disagree/Comply: Disagree  Disagree/Comply Reason: Disagree with instructions     Nurse: Genoveva Ill RN, Lattie Haw Date/Time: 03/16/2015 4:12:54 PM  Triage Guideline: Nurse assessment   Confirm and document reason for call. If symptomatic, describe symptoms. You must click the next  button to save text entered.   -----caller states has metastatic brain cancer. This week she has come down with the rhinovirus. She has a cough with green mucus, chest congestion and sinus drainage. She has no fever yet. Oncologist told her she could take OTC cough cold med and ibuprofen   Has the patient traveled out of the country within the last 30 days?   -----Not Applicable   Does the patient have any new or worsening symptoms?   -----Yes   Will a triage be completed?   -----Yes   Related visit to physician within the last 2 weeks?   -----No   Does the PT have any chronic conditions? (i.e. diabetes, asthma, etc.)   -----  Yes   List chronic conditions.   -----HTN, pace maker, CA   Is this a behavioral health or substance abuse call?   -----No   Disposition: Assessment Completed                   Disp. Time Disposition Final User  03/16/2015 3:01:36 PM Attempt made - message left  Burress, RN, Lattie Haw       03/16/2015 3:03:55 PM Attempt made - message left  Burress, RN, Lattie Haw       03/16/2015 4:06:05 PM See Physician within 24 Hours Yes Burress, RN, Lattie Haw        Comments  User: Genoveva Ill, RN, Lisa Date/Time: 03/16/2015 4:12:14 PM  triaged to be seen within 24 hrs, but caller does not want to be seen in office, wants meds called in possibly and needs to know which OTC cough/cold meds would be safe to take with other meds; sent e-visit info and advised someone will call back  User: Genoveva Ill, RN, Lattie Haw Date/Time: 03/16/2015 4:13:35 PM  called back line and reported to [facility]   Referrals  Type Name Date Referred  Go To GO TO FACILITY REFUSED 03/16/2015 4:06:02 PM

## 2015-03-16 NOTE — Telephone Encounter (Signed)
Willing to take her  Back as new patient ( I am not taking new  Adult patients otherwise)  Please refer to triage ... I may not be able to see her for a few weeks  And she has an acute problem . If deemed  needed  To be seen  In office  , See  if other provider can see her .  Thanks [facility]

## 2015-03-17 NOTE — Telephone Encounter (Signed)
Left message on voicemail to call office. Pt needs an appt can not call antibiotics in without being seen. Pt can take OTC Mucinex and Delsym cough syrup per Tommi Rumps.

## 2015-03-18 ENCOUNTER — Other Ambulatory Visit: Payer: BLUE CROSS/BLUE SHIELD

## 2015-03-18 NOTE — Telephone Encounter (Signed)
Pt would  like you to call her to discuss the fact why she is considered a "New Pt" after not being seen since 2012.  Pt was informed of Dr Beaulah Corin nosh decision she will take her back, (see message below). However, pt has stage 3 cancer and does not want to come into the office. She is wants to meet with you.  I asked her if she is willing to come and met with you, why cannot make her an appt? She states she will come in a back door, meet at [employer], or video chat.  Pt would like you to call her.   I did reinforce Dr Regis Bill is willing to see her, but will not be back and it may be several weeks. There is another phone note from triage and Butch Penny did respond , still stating pt needs appt.

## 2015-03-22 DIAGNOSIS — E119 Type 2 diabetes mellitus without complications: Secondary | ICD-10-CM | POA: Diagnosis not present

## 2015-03-22 DIAGNOSIS — C7931 Secondary malignant neoplasm of brain: Secondary | ICD-10-CM | POA: Diagnosis not present

## 2015-03-22 DIAGNOSIS — C439 Malignant melanoma of skin, unspecified: Secondary | ICD-10-CM | POA: Diagnosis not present

## 2015-03-22 DIAGNOSIS — E559 Vitamin D deficiency, unspecified: Secondary | ICD-10-CM | POA: Diagnosis not present

## 2015-03-22 DIAGNOSIS — C799 Secondary malignant neoplasm of unspecified site: Secondary | ICD-10-CM | POA: Diagnosis not present

## 2015-03-22 NOTE — Telephone Encounter (Signed)
Returned call to patient who was concerned about the fact she is no longer considered to be a patient of Dr. Regis Bill.  Patient went on to mention she was diagnosed with Stage IV metastatic cancer in 2014, which is why she hasn't been in to see PCP.  However, she states pcp has been receiving and reviewing all of her records from her specialists.  Per previous note from pcp, patient was offered an appt with Dr. Regis Bill and schedule to be seen on 03/30/15 at 12pm.  Pt's states her physician at [facility] asked that she request to enter at rear entrance due to her being immunocompromised.  I informed patient to contact me directly when she arrives for her appt and I will let her in through the rear entrance.

## 2015-03-30 ENCOUNTER — Ambulatory Visit (INDEPENDENT_AMBULATORY_CARE_PROVIDER_SITE_OTHER): Payer: Medicare Other | Admitting: Internal Medicine

## 2015-03-30 ENCOUNTER — Encounter: Payer: Self-pay | Admitting: *Deleted

## 2015-03-30 ENCOUNTER — Encounter: Payer: Self-pay | Admitting: Internal Medicine

## 2015-03-30 VITALS — BP 130/80 | Temp 97.7°F | Ht 63.5 in | Wt 149.0 lb

## 2015-03-30 DIAGNOSIS — R000 Tachycardia, unspecified: Secondary | ICD-10-CM

## 2015-03-30 DIAGNOSIS — C799 Secondary malignant neoplasm of unspecified site: Secondary | ICD-10-CM | POA: Diagnosis not present

## 2015-03-30 DIAGNOSIS — Z79899 Other long term (current) drug therapy: Secondary | ICD-10-CM | POA: Diagnosis not present

## 2015-03-30 DIAGNOSIS — Z8679 Personal history of other diseases of the circulatory system: Secondary | ICD-10-CM | POA: Diagnosis not present

## 2015-03-30 DIAGNOSIS — Z950 Presence of cardiac pacemaker: Secondary | ICD-10-CM | POA: Insufficient documentation

## 2015-03-30 DIAGNOSIS — I951 Orthostatic hypotension: Secondary | ICD-10-CM

## 2015-03-30 DIAGNOSIS — G90A Postural orthostatic tachycardia syndrome (POTS): Secondary | ICD-10-CM | POA: Insufficient documentation

## 2015-03-30 DIAGNOSIS — C439 Malignant melanoma of skin, unspecified: Secondary | ICD-10-CM

## 2015-03-30 DIAGNOSIS — R7301 Impaired fasting glucose: Secondary | ICD-10-CM

## 2015-03-30 DIAGNOSIS — I498 Other specified cardiac arrhythmias: Secondary | ICD-10-CM

## 2015-03-30 NOTE — Progress Notes (Signed)
Pre visit review using our clinic review tool, if applicable. No additional management support is needed unless otherwise documented below in the visit note.  Chief Complaint  Patient presents with  . Establish Care    last ween 2012    HPI: Patient  [name]  60 y.o. comes in today for reestablishing  Health Care visit  Last seen 2012 has had dx and rx for POTS metastatic stage 4  melanoma last scan stable, Dr Harriet Masson [facility] for off protocol  ipilimumab ,  [facility] sherrill oncology dr Lynelle Doctor cardiology   Dr Hilarie Fredrickson  With hx of pancreatitis and colitiss   And dicerticulitis As well as .Marland Kitchen Her medical needs have been managed bu thios team   In past . Now reestablishing with PCP  For  Primary care needs .  Has had problems recetntly with Had  Resp... infection.     Since improved  She is to be cautious and wear mask and avoid waiting rooms   4  Courses.   Of rx complicaiton last year      Pancreatitis  And colitis  Immunotherapy.   To scan in March again .     ldh is 560 from remission   But up some today .  660   Diet changes from pancreatitis   Low fiber sometime mirilax  To help.   On metformin . Cause of pre diabetes   Per dr Chalmers Cater in past  ? If adds to gi issues  Hg a1c was 5.6 last month  Nl bg and off  Pred.   adls good  Driving      Cognitive  Minimal   Deficit But   Has focusing once conversation on time.   Neuropathy  No falling. In tegrative therapy.     Therapy has helped  With local care Some   Self pay.   Hx of steroids.     With meds  weight better   On low dose keppra  Suppression  Dr Ellene Route Health Maintenance  Topic Date Due  . Hepatitis C Screening  Aug 16, 1955  . URINE MICROALBUMIN  12/30/1967  . HIV Screening  12/29/1972  . TETANUS/TDAP  12/29/1976  . PAP SMEAR  12/30/1978  . MAMMOGRAM  11/26/2010  . INFLUENZA VACCINE  07/27/2015 (Originally 09/27/2014)  . COLONOSCOPY  08/12/2016   Health Maintenance Review LIFESTYLE:  Exercise:   n Tobacco/ETS:n Alcohol: per day n Sugar beverages:n Sleep:6 hours Drug use: no   ROS:  GEN/ HEENT: No fever, significant weight changes sweats headaches vision problems hearing changes, CV/ PULM; No chest pain shortness of breath cough, syncope,edema  change in exercise tolerance. GI /GU: No adominal pain, vomiting,GI as above  change in bowel habits. No blood in the stool. No significant GU symptoms. SKIN/HEME: ,no acute skin rashes suspicious lesions or bleeding. No lymphadenopathy, nodules, masses.  NEURO/ PSYCH:   neuropathy see above. No depression anxiety. IMM/ Allergy: No unusual infections.  Allergy .   REST of 12 system review negative or [place] except as per HPI   Past Medical History  Diagnosis Date  . 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 Hypoglycemia, unspecified   . Other nonspecific abnormal serum enzyme levels   . Cervicalgia   . Disturbance of skin sensation   . Other specified congenital anomalies of nervous system   . Swelling of limb   . Disorder of bone and cartilage, unspecified   . Atrial  tachycardia ([place])   . CVA (cerebral infarction)     2012 with dizziness and vision change felt embolic from atrial tachy  . History of pacemaker   . POTS (postural orthostatic tachycardia syndrome)   . Diverticulosis   . Osteopenia   . DVT (deep venous thrombosis) ([place])   . Atrial fibrillation ([place])   . DM (diabetes mellitus) ([place])   . DVT (deep venous thrombosis) ([place])   . HLD (hyperlipidemia)   . HTN (hypertension)   . Pacemaker     autonomic dysfunction  . Complication of anesthesia     pt states wakes up with "shakes"  . PONV (postoperative nausea and vomiting)   . Stroke [facility])     left weaker   . History of radiation therapy 10/24/12    brain  . Skin cancer     Hx: of lung lesion  . Family history of anesthesia complication     PONV  . Asthma   . Pancreatitis     from therapy    Past Surgical History  Procedure Laterality  Date  . Insert / replace / remove pacemaker  2012    1999, x 3  . Ablation saphenous vein w/ rfa      2002  . Tonsillectomy and adenoidectomy    . Carpal tunnel release Left   . Nose surgery  2010, 2012    for nose bleeds x 2  . Fatty tumor removed      from chest  . Melanoma excision      with removal of lymph nodes, left shoulder  . Deep axillary sentinel node biopsy / excision      due to extensive Melanoma-right arm  . Laparoscopy  09/06/2011    Procedure: LAPAROSCOPY OPERATIVE;  Surgeon: Claiborne Billings A. Pamala Hurry, MD;  Location: [place] [facility];  Service: Gynecology;  Laterality: Left;  with Left Ovarian Cystectomy   . Craniotomy N/A 09/30/2012    suboccipital craniectomy  . Craniotomy Left 02/09/2013    Procedure: LEFT PARIETAL CRANIOTOMY with stealth;  Surgeon: [name], MD;  Location: [place] [facility];  Service: Neurosurgery;  Laterality: Left;  LEFT Parietal Craniotomy for tumor with stealth    Family History  Problem Relation Age of Onset  . Heart disease Father   . Diabetes Maternal Grandmother   . Diabetes Paternal Grandmother   . Stroke Mother   . Colon polyps Mother   . Clotting disorder Maternal Grandmother     stroke  . Crohn's disease Maternal Grandmother   . Diabetes Father   . Kidney disease Father   . Aneurysm Sister     brain    Social History   Social History  . Marital Status: Married    Spouse Name: N/A  . Number of Children: 0  . Years of Education: N/A   Occupational History  . PRESIDENT     self employed   Social History Main Topics  . Smoking status: Never Smoker   . Smokeless tobacco: Never Used  . Alcohol Use: No     Comment: glass of wine daily  . Drug Use: No  . Sexual Activity: Not Asked   Other Topics Concern  . None   Social History Narrative    Outpatient Prescriptions Prior to Visit  Medication Sig Dispense Refill  . acetaminophen (TYLENOL) 500 MG tablet Take 1,000 mg by mouth every 6 (six) hours as needed for pain.    Marland Kitchen albuterol  (VENTOLIN HFA) 108 (90 BASE) MCG/ACT inhaler Inhale 2  puffs into the lungs as needed.    . bimatoprost (LATISSE) 0.03 % ophthalmic solution Apply 1 application topically as needed.     . clonazePAM (KLONOPIN) 0.5 MG tablet Take 1 tablet (0.5 mg total) by mouth 3 (three) times daily as needed for anxiety. 50 tablet 0  . cloNIDine (CATAPRES) 0.1 MG tablet TAKE ONE TABLET BY MOUTH AT BEDTIME  90 tablet 3  . cyanocobalamin (,VITAMIN B-12,) 1000 MCG/ML injection ADMINISTER 1 ML UNDER THE SKIN 1 TIME A MONTH AS DIRECTED 1 mL 0  . cyanocobalamin (,VITAMIN B-12,) 1000 MCG/ML injection ADMINISTER 1 ML UNDER THE SKIN 1 TIME A MONTH AS DIRECTED 1 mL 11  . diphenhydrAMINE (BENADRYL) 25 MG tablet Take 12.5 mg by mouth every 6 (six) hours as needed. Seasonal allergies    . esomeprazole (NEXIUM) 40 MG capsule Take 1 capsule (40 mg total) by mouth 2 (two) times daily before a meal. 60 capsule 2  . ferrous sulfate 325 (65 FE) MG EC tablet Take 1 tablet (325 mg total) by mouth daily with breakfast.  3  . furosemide (LASIX) 40 MG tablet Take 20 mg by mouth daily. Takes PRN    . hydrocortisone (ANUSOL-HC) 25 MG suppository Place 25 mg rectally at bedtime as needed for hemorrhoids.    . hydrocortisone 2.5 % cream Apply 1 application topically 2 (two) times daily as needed (irritation).     Marland Kitchen levETIRAcetam (KEPPRA) 500 MG tablet Take 500 mg by mouth 2 (two) times daily.     . mesalamine (CANASA) 1000 MG suppository Place 1,000 mg rectally at bedtime as needed (hemorrhoids).     . metFORMIN (GLUCOPHAGE) 500 MG tablet Take 500 mg by mouth daily after breakfast.    . metoprolol tartrate (LOPRESSOR) 25 MG tablet Take 1 tablet (25 mg total) by mouth 2 (two) times daily. 180 tablet 0  . mometasone (NASONEX) 50 MCG/ACT nasal spray Place 2 sprays into the nose daily.    Marland Kitchen nystatin (MYCOSTATIN) 100000 UNIT/ML suspension Take 5 mLs (500,000 Units total) by mouth 4 (four) times daily. (Patient taking differently: Take 5 mLs by  mouth as needed. ) 60 mL 0  . ondansetron (ZOFRAN-ODT) 4 MG disintegrating tablet Take 4 mg by mouth as needed. '4mg'$  ODT q4 hours prn nausea/vomit    . polyethylene glycol powder (GLYCOLAX/MIRALAX) powder Stir 17 grams in 8oz of water, or juice, 1 or 2 times daily for constipation PRN    . Potassium Chloride ER 20 MEQ TBCR Take 20 mEq by mouth 2 (two) times daily. 60 tablet 1  . potassium chloride SA (K-DUR,KLOR-CON) 20 MEQ tablet TAKE ONE TABLET BY MOUTH TWICE DAILY 60 tablet 3  . Vitamin D, Ergocalciferol, (DRISDOL) 50000 UNITS CAPS capsule Take 50,000 Units by mouth every 7 (seven) days.     No facility-administered medications prior to visit.     EXAM:  BP 130/80 mmHg  Temp(Src) 97.7 F (36.5 C) (Oral)  Ht 5' 3.5" (1.613 m)  Wt 149 lb (67.586 kg)  BMI 25.98 kg/m2  Body mass index is 25.98 kg/(m^2).  Physical Exam: Vital signs reviewed WGY:KZLD is a well-developed well-nourished alert cooperative    who appearsr stated age in no acute distress.  HEENT: normocephalic , Eyes: PERRL EOM's full, conjunctiva clear, Nares: paten,t no deformity discharge or tenderness., Ears: no deformity EAC's clear TMs with normal landmarks. NECK: supple without masses, thyromegaly or bruits. CHEST/PULM:  Clear to auscultation and percussion breath sounds equal no wheeze , rales or rhonchi.  CV: PMI is nondisplaced, S1 S2 no gallops, murmurs, rubs. Peripheral pulses are present.No JVD .  ABDOMEN: Bowel sounds normal nontender  No guard or rebound, no hepato splenomegal no CVA tenderness.  . Extremtities:  No clubbing cyanosis or edema, no acute joint swelling or redness no focal atrophy NEURO:  Oriented x3, cranial nerves 3-12 appear to be intact, no obvious focal weakness,gait within normal limits cognition seems gross nl SKIN: No acute rashes normal turgor, color, no bruising or petechiae. PSYCH: Oriented, good eye contact, no obvious depression anxiety, cognition and judgment appear normal. LN: no  cervicaladenopathy  Lab Results  Component Value Date   WBC 5.4 02/23/2015   HGB 14.8 02/23/2015   HCT 43.0 02/23/2015   PLT 266 02/23/2015   GLUCOSE 89 02/23/2015   CHOL 145 02/09/2014   TRIG 123.0 02/09/2014   HDL 47.90 02/09/2014   LDLDIRECT 109.7 10/11/2010   LDLCALC 73 02/09/2014   ALT 42 02/23/2015   AST 35* 02/23/2015   NA 141 02/23/2015   K 3.7 02/23/2015   CL 102 11/12/2013   CREATININE 0.9 02/23/2015   BUN 17.3 02/23/2015   CO2 27 02/23/2015   TSH 1.146 12/17/2013   INR 1.03 08/28/2013   HGBA1C 5.5 01/03/2006    ASSESSMENT AND PLAN:  Discussed the following assessment and plan:  History of pacemaker  Malignant melanoma, metastatic (HCC)  POTS (postural orthostatic tachycardia syndrome)  Medication management  Fasting hyperglycemia - prev dx poss pre diabetes but off steroids a1c excellent has gi issues advise trying off adn attend to lsi currently fu a1c in 3 monhts  Patient Care Team: Burnis Medin, MD as PCP - General [name], MD as Consulting Physician (Neurosurgery) [name], MD as Consulting Physician (Oncology) [name], MD as Referring Physician (Internal Medicine) Patient Instructions  Advise stop metformin   Attend to diet and avoid simple sugars and carbs.  Recheck hg a1c in 3 months  .  Here or at Specialist .       Standley Brooking. Panosh M.D.

## 2015-03-30 NOTE — Patient Instructions (Signed)
Advise stop metformin   Attend to diet and avoid simple sugars and carbs.  Recheck hg a1c in 3 months  .  Here or at Specialist .

## 2015-04-16 ENCOUNTER — Other Ambulatory Visit: Payer: Self-pay | Admitting: Internal Medicine

## 2015-04-18 ENCOUNTER — Telehealth: Payer: Self-pay | Admitting: Internal Medicine

## 2015-04-18 NOTE — Telephone Encounter (Signed)
She needs an order to re certify pt for another 9 weeks for Home Health Services please.Please call and give her a verbal order.

## 2015-04-18 NOTE — Telephone Encounter (Signed)
Spoke with RN from home care.

## 2015-04-25 NOTE — Telephone Encounter (Signed)
Discussed with Dr. Harrington Challenger that per RN from [place], patient is not homebound and does not need assist with ADLs and has not needed IV fluids.  Per Dr. Harrington Challenger patient should not be recertified for home care at this time.  If services are needed in the future, they can be restarted. RN will be seeing patient and will inform.

## 2015-04-27 ENCOUNTER — Encounter: Payer: Self-pay | Admitting: *Deleted

## 2015-04-29 ENCOUNTER — Telehealth: Payer: Self-pay | Admitting: Internal Medicine

## 2015-04-29 ENCOUNTER — Encounter: Payer: Self-pay | Admitting: Internal Medicine

## 2015-04-29 NOTE — Telephone Encounter (Signed)
Spoke to pt on phone  Concerned about home health and not having  In summer more than winter she needs periodic IV fluid IDoes not want to go to hosp with her melanoma history (risk of infection) I thnk that she should have available on prn basisi for IV fluids with history of dysautonmia  Will write letter to this fact

## 2015-04-29 NOTE — Telephone Encounter (Signed)
[place] would like for Dr Harrington Challenger to call her today if possible.

## 2015-04-29 NOTE — Telephone Encounter (Signed)
Please call,pt wants to know why her orders were terminated. She is supposed have services over the [place] concerned.Please call asap.

## 2015-05-01 DIAGNOSIS — C438 Malignant melanoma of overlapping sites of skin: Secondary | ICD-10-CM | POA: Diagnosis not present

## 2015-05-01 DIAGNOSIS — G909 Disorder of the autonomic nervous system, unspecified: Secondary | ICD-10-CM | POA: Diagnosis not present

## 2015-05-01 DIAGNOSIS — C719 Malignant neoplasm of brain, unspecified: Secondary | ICD-10-CM | POA: Diagnosis not present

## 2015-05-01 DIAGNOSIS — E119 Type 2 diabetes mellitus without complications: Secondary | ICD-10-CM | POA: Diagnosis not present

## 2015-05-01 DIAGNOSIS — Z5181 Encounter for therapeutic drug level monitoring: Secondary | ICD-10-CM | POA: Diagnosis not present

## 2015-05-03 ENCOUNTER — Encounter: Payer: Self-pay | Admitting: Internal Medicine

## 2015-05-03 DIAGNOSIS — J329 Chronic sinusitis, unspecified: Secondary | ICD-10-CM | POA: Diagnosis not present

## 2015-05-04 ENCOUNTER — Telehealth: Payer: Self-pay | Admitting: Internal Medicine

## 2015-05-04 ENCOUNTER — Telehealth: Payer: Self-pay | Admitting: *Deleted

## 2015-05-04 NOTE — Telephone Encounter (Signed)
New message   Pt is no longer home bound and she agreed with the discharged from home health  Pt called and said that she needs IV fluids    Question:   Is Dr. Harrington Challenger updated on current status of pt?  Do [facility] need to reinstate her?

## 2015-05-04 NOTE — Telephone Encounter (Signed)
Pt reports she called our on-call service on Sunday and Dr. Julien Nordmann ordered a urine sample, but did not receive results; sample suggested recollection should be done, no clear cut evidence of UTI. I explained this to patient and she voices understanding. Pt is on Omnicef for sinus infection (was previously on Clindamycin, but endodontist changed this to [facility] d/t risk for c.diff). I encouraged her that we can re-order this if she pleases, she states she sees her physician at [facility] and will call us if her symptoms worsen.  Appts for Dr. Benay Spice confirmed.

## 2015-05-04 NOTE — Telephone Encounter (Signed)
Called Barnett Applebaum back with [facility]. Will forward to Dr. Harrington Challenger and send a staff message for a letter to fax to them.

## 2015-05-05 ENCOUNTER — Telehealth: Payer: Self-pay | Admitting: *Deleted

## 2015-05-05 NOTE — Telephone Encounter (Signed)
Spoke with patient. She states that she and [name] from [place] have spoken regarding recertification of her services. Patient requests that I speak with [name].

## 2015-05-05 NOTE — Telephone Encounter (Signed)
Staff message received from [place] about letters received about overdue device check. Called patient- she reports she had her pacemaker checked in December at the time of her MRI. She was worried that Medicare would not pay for interrogation. I explained that insurance will pay for a device check every 91 days. I will send a message to scheduler Lorenda Hatchet) to schedule with Dr. Caryl Comes next available. She is agreeable.

## 2015-05-06 NOTE — Telephone Encounter (Signed)
Received plan of care from [place]. Reviewed and signed by Dr. Harrington Challenger. Placed in nurse fax bin in medical records to be faxed.

## 2015-05-10 DIAGNOSIS — C716 Malignant neoplasm of cerebellum: Secondary | ICD-10-CM | POA: Diagnosis not present

## 2015-05-10 DIAGNOSIS — C7931 Secondary malignant neoplasm of brain: Secondary | ICD-10-CM | POA: Diagnosis not present

## 2015-05-10 DIAGNOSIS — J342 Deviated nasal septum: Secondary | ICD-10-CM | POA: Diagnosis not present

## 2015-05-10 DIAGNOSIS — C439 Malignant melanoma of skin, unspecified: Secondary | ICD-10-CM | POA: Diagnosis not present

## 2015-05-10 DIAGNOSIS — C792 Secondary malignant neoplasm of skin: Secondary | ICD-10-CM | POA: Diagnosis not present

## 2015-05-10 DIAGNOSIS — Z6827 Body mass index (BMI) 27.0-27.9, adult: Secondary | ICD-10-CM | POA: Diagnosis not present

## 2015-05-10 DIAGNOSIS — K5730 Diverticulosis of large intestine without perforation or abscess without bleeding: Secondary | ICD-10-CM | POA: Diagnosis not present

## 2015-05-10 DIAGNOSIS — Z79899 Other long term (current) drug therapy: Secondary | ICD-10-CM | POA: Diagnosis not present

## 2015-05-10 DIAGNOSIS — J9811 Atelectasis: Secondary | ICD-10-CM | POA: Diagnosis not present

## 2015-05-10 DIAGNOSIS — I10 Essential (primary) hypertension: Secondary | ICD-10-CM | POA: Diagnosis not present

## 2015-05-12 NOTE — Telephone Encounter (Signed)
°  New Prob    Requesting a call back regarding services for pt.

## 2015-05-12 NOTE — Telephone Encounter (Signed)
Spoke with [facility].  She is recertifying patient for home care services. Pt has not needed her hydration for several months Sharyn Lull asked about a supporting diagnosis for B12 injections, since those are given by home care. I advised I will look into this and call her back.  She is appreciative for the assistance.

## 2015-05-17 ENCOUNTER — Encounter: Payer: Self-pay | Admitting: Internal Medicine

## 2015-05-17 DIAGNOSIS — L723 Sebaceous cyst: Secondary | ICD-10-CM | POA: Diagnosis not present

## 2015-05-17 DIAGNOSIS — L570 Actinic keratosis: Secondary | ICD-10-CM | POA: Diagnosis not present

## 2015-05-17 DIAGNOSIS — D1801 Hemangioma of skin and subcutaneous tissue: Secondary | ICD-10-CM | POA: Diagnosis not present

## 2015-05-17 DIAGNOSIS — Z85828 Personal history of other malignant neoplasm of skin: Secondary | ICD-10-CM | POA: Diagnosis not present

## 2015-05-17 DIAGNOSIS — Z85820 Personal history of malignant melanoma of skin: Secondary | ICD-10-CM | POA: Diagnosis not present

## 2015-05-18 ENCOUNTER — Inpatient Hospital Stay
Admission: RE | Admit: 2015-05-18 | Discharge: 2015-05-18 | Disposition: A | Discharge status: Home / Self Care | Payer: Self-pay | Source: Ambulatory Visit | Attending: Radiation Oncology | Admitting: Radiation Oncology

## 2015-05-18 ENCOUNTER — Other Ambulatory Visit: Payer: Self-pay | Admitting: Radiation Oncology

## 2015-05-18 DIAGNOSIS — C801 Malignant (primary) neoplasm, unspecified: Secondary | ICD-10-CM

## 2015-05-18 DIAGNOSIS — C7931 Secondary malignant neoplasm of brain: Secondary | ICD-10-CM | POA: Diagnosis not present

## 2015-05-18 DIAGNOSIS — Z6827 Body mass index (BMI) 27.0-27.9, adult: Secondary | ICD-10-CM | POA: Diagnosis not present

## 2015-05-24 NOTE — Progress Notes (Addendum)
Follow up CT HEAD 05/10/15: EXAM: Computed tomography, head or brain with contrast material. DATE: 05/10/15 12:06:39 ACCESSION: [id number] UN DICTATED: 05/10/15 12:29:18 INTERPRETATION LOCATION: [facility]  CLINICAL INDICATION: 60 Year Old (F): C79.31 - Metastatic melanoma of brain (RAF-HCC).   COMPARISON: Multiple head CT, most recent dated February 08, 2015. MRI of the brain dated June 22, 2014.  TECHNIQUE: Axial CT images from skull base to vertex with contrast.   FINDINGS: Status post left suboccipital craniotomy for metastasis resection. No abnormal enhancing lesions are appreciated. Preservation of gray white differentiation. CSF-containing spaces are normal and symmetric. The major vessels have a normal course and caliber. No aggressive osseous lesions. Globes and orbits are normal. Mild rightward nasal septal deviation.  IMPRESSION: No evidence of metastatic disease. MRI is more sensitive for evaluation of intracranial metastases EXAM: [id number] UN 05/10/15  12:06:[id number] [facility]) : CT CHEST W CONTRAST [id number] UN 05/10/15  12:06:[id number] [facility]) : CT ABDOMEN PELVIS W CONTRAST DATE: 05/10/15 12:06:39 ACCESSION: [id number] UN DICTATED: 05/10/15 13:33:25 INTERPRETATION LOCATION: [place]  CLINICAL INDICATION: 60 Year Old (F): C79.31 - Brain metastases (RAF-HCC)C79.31 - Brain metastases (RAF-HCC).  COMPARISON: CT chest, abdomen and pelvis 02/08/15 and earlier  TECHNIQUE: A spiral CT scan was obtained with IV contrast from the lung apices to the lung bases. Images were reconstructed in the axial plane. A spiral CT scan was obtained with IV contrast from the lung bases to the femoral heads.  Images were reconstructed in the axial plane. Coronal and sagittal reformatted images were also provided for further evaluation.  FINDINGS:   LINES/DEVICES: Left chest pacer device with leads terminating in the right atrium and right ventricle.  CHEST  LUNGS:  Bilateral subsegmental atelectasis. No focal consolidation. A wedge shaped opacity along right major fissure (5: 57) may represent an intrapulmonary lymph node or focus of atelectasis. LARGE AIRWAYS: No endobronchial lesions. PLEURA: No pleural effusions. MEDIASTINUM/HILA: Heart is normal in size and contour. Subcentimeter hypoattenuating lesions in the left thyroid lobe stable. Moderate hiatal hernia. VESSELS: Unremarkable. LYMPH NODES: No adenopathy.  ABDOMEN/PELVIS  HEPATOBILIARY: Stable hypoattenuating lesion along the falciform ligament segment 4, is stable. No new focal liver lesion identified. No biliary ductal dilatation. Gallbladder is unremarkable. PANCREAS: A few tiny hypoattenuating foci within the distal body/tail of the pancreas (1:15), are stable, and may reflect interdigitating fat. SPLEEN: Unremarkable. ADRENAL GLANDS: Unremarkable. KIDNEYS/URETERS: Unremarkable. BLADDER: Unremarkable. BOWEL/PERITONEUM/RETROPERITONEUM: Colonic diverticulosis without diverticulitis. No obstruction. No acute inflammatory process. No ascites. VASCULATURE: Abdominal aorta within normal limits for patient's age. Unremarkable inferior vena cava. LYMPH NODES: No adenopathy. REPRODUCTIVE ORGANS: The uterus and  right ovary unremarkable for age. The left ovary is not well-visualized.  BONES/SOFT TISSUES: No lytic or blastic osseous lesion.  IMPRESSION: No new site of disease identified in the chest, abdomen, or pelvis. Stable segment 4 hypoattenuating lesion, which may represent focal fat.  Saw Dr. Benay Spice today vital taken there in [facility] T=97.6, B/P=117/62,P=83 RR=18, 99% room air sats,   poor appetite, has thrush again , has nystatin to take prn,  Stated, headaches with the storma, and nausea, barometric pressure changes stated, Brought 2 cd's  Diarrhea still

## 2015-05-27 ENCOUNTER — Telehealth: Payer: Self-pay | Admitting: Oncology

## 2015-05-27 ENCOUNTER — Other Ambulatory Visit (HOSPITAL_BASED_OUTPATIENT_CLINIC_OR_DEPARTMENT_OTHER): Payer: Medicare Other

## 2015-05-27 ENCOUNTER — Ambulatory Visit
Admission: RE | Admit: 2015-05-27 | Discharge: 2015-05-27 | Disposition: A | Discharge status: Home / Self Care | Payer: Medicare Other | Source: Ambulatory Visit | Attending: Radiation Oncology | Admitting: Radiation Oncology

## 2015-05-27 ENCOUNTER — Ambulatory Visit (HOSPITAL_BASED_OUTPATIENT_CLINIC_OR_DEPARTMENT_OTHER): Payer: Medicare Other | Admitting: Oncology

## 2015-05-27 ENCOUNTER — Encounter: Payer: Self-pay | Admitting: Radiation Oncology

## 2015-05-27 VITALS — BP 117/62 | HR 83 | Temp 97.6°F | Resp 18 | Ht 63.5 in | Wt 149.6 lb

## 2015-05-27 DIAGNOSIS — C439 Malignant melanoma of skin, unspecified: Secondary | ICD-10-CM | POA: Diagnosis not present

## 2015-05-27 DIAGNOSIS — C799 Secondary malignant neoplasm of unspecified site: Secondary | ICD-10-CM

## 2015-05-27 DIAGNOSIS — R197 Diarrhea, unspecified: Secondary | ICD-10-CM

## 2015-05-27 DIAGNOSIS — C7931 Secondary malignant neoplasm of brain: Secondary | ICD-10-CM

## 2015-05-27 LAB — CBC WITH DIFFERENTIAL/PLATELET
BASO%: 0.9 % (ref 0.0–2.0)
Basophils Absolute: 0.1 10*3/uL (ref 0.0–0.1)
EOS%: 1.9 % (ref 0.0–7.0)
Eosinophils Absolute: 0.1 10*3/uL (ref 0.0–0.5)
HCT: 41.7 % (ref 34.8–46.6)
HGB: 14.4 g/dL (ref 11.6–15.9)
LYMPH%: 30.3 % (ref 14.0–49.7)
MCH: 30.2 pg (ref 25.1–34.0)
MCHC: 34.5 g/dL (ref 31.5–36.0)
MCV: 87.4 fL (ref 79.5–101.0)
MONO#: 0.8 10*3/uL (ref 0.1–0.9)
MONO%: 15 % — ABNORMAL HIGH (ref 0.0–14.0)
NEUT#: 2.8 10*3/uL (ref 1.5–6.5)
NEUT%: 51.9 % (ref 38.4–76.8)
Platelets: 253 10*3/uL (ref 145–400)
RBC: 4.77 10*6/uL (ref 3.70–5.45)
RDW: 12.4 % (ref 11.2–14.5)
WBC: 5.4 10*3/uL (ref 3.9–10.3)
lymph#: 1.6 10*3/uL (ref 0.9–3.3)

## 2015-05-27 LAB — COMPREHENSIVE METABOLIC PANEL
ALT: 33 U/L (ref 0–55)
AST: 25 U/L (ref 5–34)
Albumin: 4.4 g/dL (ref 3.5–5.0)
Alkaline Phosphatase: 127 U/L (ref 40–150)
Anion Gap: 9 mEq/L (ref 3–11)
BUN: 18.8 mg/dL (ref 7.0–26.0)
CO2: 27 mEq/L (ref 22–29)
Calcium: 9.8 mg/dL (ref 8.4–10.4)
Chloride: 104 mEq/L (ref 98–109)
Creatinine: 0.9 mg/dL (ref 0.6–1.1)
EGFR: 74 mL/min/{1.73_m2} — ABNORMAL LOW (ref >90)
Glucose: 89 mg/dl (ref 70–140)
Potassium: 4.1 mEq/L (ref 3.5–5.1)
Sodium: 140 mEq/L (ref 136–145)
Total Bilirubin: 0.4 mg/dL (ref 0.20–1.20)
Total Protein: 8 g/dL (ref 6.4–8.3)

## 2015-05-27 LAB — LACTATE DEHYDROGENASE: LDH: 247 U/L — ABNORMAL HIGH (ref 125–245)

## 2015-05-27 NOTE — Telephone Encounter (Signed)
Gave and printed updated sched for Sept

## 2015-05-27 NOTE — Telephone Encounter (Signed)
Gave and printed appt shced and avs for pt for Sept

## 2015-05-27 NOTE — Progress Notes (Signed)
[place] OFFICE PROGRESS NOTE   Diagnosis: Melanoma  INTERVAL HISTORY:   Ms.Deere returns as scheduled. No new neurologic symptoms. She continues to have intermittent diarrhea. No bleeding. She reports anorexia. She is avoiding sugar in the diet. She plans to begin a nutrition supplement. She underwent a restaging CT evaluation at [facility] on 05/10/2015. There is no evidence of metastatic disease in the head. No evidence of disease progression in the chest, abdomen, or pelvis. She reports Dr. Harriet Masson we'll see her again in 6 months.    Objective:  Vital signs in last 24 hours:  Blood pressure 117/62, pulse 83, temperature 97.6 F (36.4 C), temperature source Oral, resp. rate 18, height 5' 3.5" (1.613 m), weight 149 lb 9.6 oz (67.858 kg), SpO2 99 %.    HEENT:  neck without mass Lymphatics:  no cervical, supraclavicular, or axillary nodes Resp:  lungs clear bilaterally Cardio:  regular rate and rhythm GI:  no hepatomegaly Vascular:  no leg edema neurologic: The motor exam appears intact in the upper and lower extremities  skin: Right upper arm scar without evidence of recurrent tumor    Lab Results:  Lab Results  Component Value Date   WBC 5.4 05/27/2015   HGB 14.4 05/27/2015   HCT 41.7 05/27/2015   MCV 87.4 05/27/2015   PLT 253 05/27/2015   NEUTROABS 2.8 05/27/2015   Albumin 4.4, AST 25, ALT 33, bilirubin 0.4, alk phosphatase 127   Medications: I have reviewed the patient's current medications.  Assessment/Plan: 1.Metastatic melanoma  -Status post resection of a right arm melanoma, stage II A (T2b N0) in December 2009  -Metastatic melanoma, status post resection of a left cerebellar metastasis 09/30/2012, BRAF mutation identified  -Status post SRS treatment of the left cerebellum and a left parietal metastasis 10/24/2012  -Staging CTs and PET scan without evidence of distant metastatic disease aside from indeterminate small lung nodules   -Restaging CTs 12/01/2012 consistent with enlargement of the left parietal mass and right lower lobe lung nodule  -Initiation of dabrafenib and tremetinib 12/17/2012. She reports extremely poor tolerance. Discontinued.  -Status post resection of a left parietal metastasis 02/09/2013.  -Restaging CT evaluation 03/03/2013. Stable 1.5 cm right lower lobe pulmonary nodule. Multiple peripheral enhancing lesions throughout the liver.  -Initiation of Ipilimumab at [facility] 03/10/2013.  -Cycle 2 Ipilumumab 04/17/2013  -Cycle 3 Ipilumumab 05/14/2013  -Cycle 4 Ipilumumab 06/04/2013  -Restaging CT at [facility] 06/25/2013 with multiple subcentimeter liver lesions, no new lesions, decreased right lower lobe nodule.  -Brain MRI 07/07/2013 with no evidence of progressive metastatic disease.  -Brain CT 10/09/2013 with no evidence of progressive metastatic disease  -Brain CT and CTs of the chest, abdomen, and pelvis at [facility] on 11/03/2013 with no evidence of progressive disease  -CTs of the brain, chest, abdomen, and pelvis at [facility] on 02/02/2014 with no evidence of disease progression -CTs of the brain, chest, abdomen, and pelvis at [facility] on 05/04/2014-no evidence of disease progression -CTs of the brain, chest, abdomen, and pelvis at [facility] on 08/03/2014-stable -CTs of the brain, chest, abdomen, and pelvis at [facility] on 11/02/2014-stable -CTs of the brain, chest, abdomen, and pelvis at [facility] on 02/08/2015-stable -CTs of the brain, chest, abdomen, and pelvis at [facility] on 05/10/2015-stable  2. Arrhythmia, pacemaker in place.  3. Hypertension  4. History of a CVA  5. History of deep vein thrombosis  6. History of proximal leg weakness-likely related to chronic steroid use.  7. Intermittent rash at the posterior thighs and left  hand-likely related to systemic therapy.  8. Leg edema-? Related to liver disease,? Secondary to ipilumumab . Improved on furosemide.  9. Headache and mild nausea-coincided with  discontinuation of Decadron, CT of the brain 03/25/2011 with no acute change.  10. 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 History of Mild elevation of lipase-likely mild asymptomatic pancreatitis related to Ipilumumab  12. Red cell microcytosis and iron deficiency 03/17/2014-persistent borderline low iron stores and Red cell microcytosis, negative stool Hemoccults, improved    Disposition:   She remains in clinical remission from metastatic melanoma. She will return for an office visit here after the restaging evaluation in September. She has established care with Dr. Hilarie Fredrickson.  She will contact us in the interim for new symptoms.  Betsy Coder, MD  05/27/2015  10:36 AM

## 2015-05-27 NOTE — Progress Notes (Signed)
Radiation Oncology         [phone] ________________________________  Name: [name] MRN: [id number]  Date: 05/27/2015  DOB: Aug 21, 1955  Follow-Up Visit Note  CC: [name], MD  [name], MD  Diagnosis:      ICD-9-CM ICD-10-CM   1. Brain metastasis (HCC) 198.3 C79.31      Interval Since Last Radiation:   Approximately 2-1/2 years  Narrative:  The patient returns today for routine follow-up.  Follow up CT HEAD 05/10/15: EXAM: Computed tomography, head or brain with contrast material. DATE: 05/10/15 12:06:39 ACCESSION: [id number] UN DICTATED: 05/10/15 12:29:18 INTERPRETATION LOCATION: [facility]  CLINICAL INDICATION: 60 Year Old (F): C79.31 - Metastatic melanoma of brain (RAF-HCC).   COMPARISON: Multiple head CT, most recent dated February 08, 2015. MRI of the brain dated June 22, 2014.  TECHNIQUE: Axial CT images from skull base to vertex with contrast.   FINDINGS: Status post left suboccipital craniotomy for metastasis resection. No abnormal enhancing lesions are appreciated. Preservation of gray white differentiation. CSF-containing spaces are normal and symmetric. The major vessels have a normal course and caliber. No aggressive osseous lesions. Globes and orbits are normal. Mild rightward nasal septal deviation.  IMPRESSION: No evidence of metastatic disease. MRI is more sensitive for evaluation of intracranial metastases EXAM: [id number] UN 05/10/15  12:06:[id number] [facility]) : CT CHEST W CONTRAST [id number] UN 05/10/15  12:06:[id number] [facility]) : CT ABDOMEN PELVIS W CONTRAST DATE: 05/10/15 12:06:39 ACCESSION: [id number] UN DICTATED: 05/10/15 13:33:25 INTERPRETATION LOCATION: [place]  CLINICAL INDICATION: 60 Year Old (F): C79.31 - Brain metastases (RAF-HCC)C79.31 - Brain metastases (RAF-HCC).  COMPARISON: CT chest, abdomen and pelvis 02/08/15 and earlier  TECHNIQUE: A spiral CT scan was obtained with IV contrast from the  lung apices to the lung bases. Images were reconstructed in the axial plane. A spiral CT scan was obtained with IV contrast from the lung bases to the femoral heads.  Images were reconstructed in the axial plane. Coronal and sagittal reformatted images were also provided for further evaluation.  FINDINGS:   LINES/DEVICES: Left chest pacer device with leads terminating in the right atrium and right ventricle.  CHEST  LUNGS: Bilateral subsegmental atelectasis. No focal consolidation. A wedge shaped opacity along right major fissure (5: 57) may represent an intrapulmonary lymph node or focus of atelectasis. LARGE AIRWAYS: No endobronchial lesions. PLEURA: No pleural effusions. MEDIASTINUM/HILA: Heart is normal in size and contour. Subcentimeter hypoattenuating lesions in the left thyroid lobe stable. Moderate hiatal hernia. VESSELS: Unremarkable. LYMPH NODES: No adenopathy.  ABDOMEN/PELVIS  HEPATOBILIARY: Stable hypoattenuating lesion along the falciform ligament segment 4, is stable. No new focal liver lesion identified. No biliary ductal dilatation. Gallbladder is unremarkable. PANCREAS: A few tiny hypoattenuating foci within the distal body/tail of the pancreas (1:15), are stable, and may reflect interdigitating fat. SPLEEN: Unremarkable. ADRENAL GLANDS: Unremarkable. KIDNEYS/URETERS: Unremarkable. BLADDER: Unremarkable. BOWEL/PERITONEUM/RETROPERITONEUM: Colonic diverticulosis without diverticulitis. No obstruction. No acute inflammatory process. No ascites. VASCULATURE: Abdominal aorta within normal limits for patient's age. Unremarkable inferior vena cava. LYMPH NODES: No adenopathy. REPRODUCTIVE ORGANS: The uterus and  right ovary unremarkable for age. The left ovary is not well-visualized.  BONES/SOFT TISSUES: No lytic or blastic osseous lesion.  IMPRESSION: No new site of disease identified in the chest, abdomen, or pelvis. Stable segment 4 hypoattenuating lesion, which may  represent focal fat.  Saw Dr. Benay Spice today vital taken there in [facility] T=97.6, B/P=117/62,P=83 RR=18, 99% room air sats,   poor appetite, has thrush again , has nystatin to take prn,  Stated, headaches  with the storma, and nausea, barometric pressure changes stated, Brought 2 cd's  Diarrhea still                               ALLERGIES:  is allergic to doxycycline; erythromycin; penicillins; preparation h; promethazine; sulfonamide derivatives; and phenergan.  Meds: Current Outpatient Prescriptions  Medication Sig Dispense Refill  . acetaminophen (TYLENOL) 500 MG tablet Take 1,000 mg by mouth every 6 (six) hours as needed for pain.    Marland Kitchen albuterol (VENTOLIN HFA) 108 (90 BASE) MCG/ACT inhaler Inhale 2 puffs into the lungs as needed.    . bimatoprost (LATISSE) 0.03 % ophthalmic solution Apply 1 application topically as needed.     . clonazePAM (KLONOPIN) 0.5 MG tablet Take 1 tablet (0.5 mg total) by mouth 3 (three) times daily as needed for anxiety. 50 tablet 0  . cloNIDine (CATAPRES) 0.1 MG tablet TAKE ONE TABLET BY MOUTH NIGHTLY AT BEDTIME 90 tablet 2  . cyanocobalamin (,VITAMIN B-12,) 1000 MCG/ML injection ADMINISTER 1 ML UNDER THE SKIN 1 TIME A MONTH AS DIRECTED 1 mL 0  . diphenhydrAMINE (BENADRYL) 25 MG tablet Take 12.5 mg by mouth every 6 (six) hours as needed. Seasonal allergies    . esomeprazole (NEXIUM) 40 MG capsule Take 1 capsule (40 mg total) by mouth 2 (two) times daily before a meal. 60 capsule 2  . ferrous sulfate 325 (65 FE) MG EC tablet Take 1 tablet (325 mg total) by mouth daily with breakfast.  3  . furosemide (LASIX) 40 MG tablet Take 20 mg by mouth daily. Takes PRN    . hydrocortisone (ANUSOL-HC) 25 MG suppository Place 25 mg rectally at bedtime as needed for hemorrhoids.    . hydrocortisone 2.5 % cream Apply 1 application topically 2 (two) times daily as needed (irritation).     Marland Kitchen levETIRAcetam (KEPPRA) 500 MG tablet Take 500 mg by mouth 2 (two) times daily.     .  mesalamine (CANASA) 1000 MG suppository Place 1,000 mg rectally at bedtime as needed (hemorrhoids).     . metoprolol tartrate (LOPRESSOR) 25 MG tablet Take 1 tablet (25 mg total) by mouth 2 (two) times daily. 180 tablet 0  . mometasone (NASONEX) 50 MCG/ACT nasal spray Place 2 sprays into the nose daily.    Marland Kitchen nystatin (MYCOSTATIN) 100000 UNIT/ML suspension Take 5 mLs (500,000 Units total) by mouth 4 (four) times daily. (Patient taking differently: Take 5 mLs by mouth as needed. ) 60 mL 0  . ondansetron (ZOFRAN-ODT) 4 MG disintegrating tablet Take 4 mg by mouth as needed. '4mg'$  ODT q4 hours prn nausea/vomit    . polyethylene glycol powder (GLYCOLAX/MIRALAX) powder Stir 17 grams in 8oz of water, or juice, 1 or 2 times daily for constipation PRN    . potassium chloride SA (K-DUR,KLOR-CON) 20 MEQ tablet TAKE ONE TABLET BY MOUTH TWICE DAILY 60 tablet 3  . Vitamin D, Ergocalciferol, (DRISDOL) 50000 UNITS CAPS capsule Take 50,000 Units by mouth every 7 (seven) days.     No current facility-administered medications for this encounter.    Physical Findings: The patient is in no acute distress. Patient is alert and oriented.  vitals were not taken for this visit..     Lab Findings: Lab Results  Component Value Date   WBC 5.4 05/27/2015   HGB 14.4 05/27/2015   HCT 41.7 05/27/2015   MCV 87.4 05/27/2015   PLT 253 05/27/2015     Radiographic Findings:  Ct Outside Films Chest  05/18/2015  CLINICAL DATA:  This exam is stored here for comparison purposes only and was performed at an outside facility.   Please contact the originating institution for any associated interpretation or report.   Ct Outside Films Head/face  05/18/2015  CLINICAL DATA:  This exam is stored here for comparison purposes only and was performed at an outside facility.   Please contact the originating institution for any associated interpretation or report.   Ct Outside Films Body  05/18/2015  CLINICAL DATA:  This exam is stored  here for comparison purposes only and was performed at an outside facility.   Please contact the originating institution for any associated interpretation or report.    Impression:    The patient clinically is doing very well. Her recent imaging corresponded to this with no signs of residual or progressive disease either within the brain or elsewhere systemically. She had this recent CT imaging performed at [facility]. She has been scheduled for repeat CT scans of the body in 6 months at [facility]. It has been requested for her to undergo a repeat brain MRI scan here prior to her visit fair on 11/01/2015. Her brain MRI scan will therefore need to be completed the week prior to Labor Day.  Plan:  I'm very pleased with how the patient is doing. No evidence of disease currently. We will continue ongoing follow-up as noted above.  The patient was seen today for 20 minutes, with the majority of the time spent counseling the patient on his diagnosis of cancer and coordinating his care.    Jodelle Gross, M.D., Ph.D.

## 2015-06-02 DIAGNOSIS — H16223 Keratoconjunctivitis sicca, not specified as Sjogren's, bilateral: Secondary | ICD-10-CM | POA: Diagnosis not present

## 2015-06-02 DIAGNOSIS — H1013 Acute atopic conjunctivitis, bilateral: Secondary | ICD-10-CM | POA: Diagnosis not present

## 2015-06-11 ENCOUNTER — Other Ambulatory Visit: Payer: Self-pay | Admitting: Radiation Oncology

## 2015-06-20 ENCOUNTER — Telehealth: Payer: Self-pay | Admitting: Internal Medicine

## 2015-06-20 NOTE — Telephone Encounter (Signed)
Forwarding to Dr. Harrington Challenger to review. Patient needs diagnosis for B12 injections so that home care nurse can administer them.  Otherwise her home care services will be discontinued.

## 2015-06-20 NOTE — Telephone Encounter (Signed)
New message      Need adv home care services order completed by tomorrow or she will loose her services.  She says this has been ongoing for 2 weeks.  The order is for hydration and skilled nurse to administer B12 injections. Please call if there is any problems or questions.

## 2015-06-21 NOTE — Telephone Encounter (Signed)
Try anemia as diagnosis

## 2015-06-21 NOTE — Telephone Encounter (Signed)
Left a message for Sharyn Lull at [facility] [phone] Ext [phone]) to call back, that Dr. Harrington Challenger recommends anemia as diagnosis for patient to receive B12 injections by home care nurse.

## 2015-07-01 ENCOUNTER — Telehealth: Payer: Self-pay | Admitting: Internal Medicine

## 2015-07-01 NOTE — Telephone Encounter (Signed)
I spoke with the patient.  She states Dr. Caryl Comes saw her in the hospital in radiology for her MRI scan. She reports he interrogated her device at that time. I advised she should still come and see him sometime soon as there was no information sent to the office from when Dr. Caryl Comes interrogated her device. She is due to to follow up on 07/07/15, but is due to speak at [facility]. I have rescheduled her to 07/29/15 at 12:30 pm.

## 2015-07-01 NOTE — Telephone Encounter (Signed)
New Message:  Pt called in wanting to speak with Nira Conn about if she will need to reschedule her pacer check appt with Dr. Caryl Comes considering he adjusted everything in March while she was in the hospital. Please f/u with pt to advise.

## 2015-07-01 NOTE — Telephone Encounter (Signed)
Device Clinic- can you see if the patient had anything done to her PPM in March. I'm not really sure what she is referring to regarding a hospitalization in March. Please help!

## 2015-07-01 NOTE — Telephone Encounter (Signed)
I do not see where she was hospitalized in March.  The last time her device was checked by [name] was on 03/02/2014.  I see this phone note from [facility] on 05/05/15:  [name]  05/05/2015  Telephone  MRN:  [id number]   Description: 60 year old female  Provider: Ranee Gosselin, RN  Department: [facility] Office       Call Documentation      Ranee Gosselin, RN at 05/05/2015 3:32 PM     Status: Signed       Expand All Collapse All   Staff message received from [place] about letters received about overdue device check. Called patient- she reports she had her pacemaker checked in December at the time of her MRI. She was worried that Medicare would not pay for interrogation. I explained that insurance will pay for a device check every 91 days. I will send a message to scheduler Lorenda Hatchet) to schedule with Dr. Caryl Comes next available. She is agreeable.

## 2015-07-07 ENCOUNTER — Encounter: Payer: Medicare Other | Admitting: Internal Medicine

## 2015-07-18 ENCOUNTER — Other Ambulatory Visit: Payer: Self-pay | Admitting: Oncology

## 2015-07-19 ENCOUNTER — Telehealth: Payer: Self-pay | Admitting: Internal Medicine

## 2015-07-19 ENCOUNTER — Other Ambulatory Visit: Payer: Self-pay | Admitting: *Deleted

## 2015-07-19 ENCOUNTER — Telehealth: Payer: Self-pay | Admitting: *Deleted

## 2015-07-19 MED ORDER — HYDROCORTISONE ACETATE 25 MG RE SUPP: 25.0000 mg | Freq: Every evening | RECTAL | Status: DC | PRN

## 2015-07-19 MED ORDER — MESALAMINE 1000 MG RE SUPP: 1000.0000 mg | Freq: Every evening | RECTAL | Status: DC | PRN

## 2015-07-19 MED ORDER — HYDROCORTISONE 2.5 % EX CREA: 1.0000 "application " | TOPICAL_CREAM | Freq: Two times a day (BID) | CUTANEOUS | Status: DC | PRN

## 2015-07-19 NOTE — Telephone Encounter (Signed)
Cancer center, Dr. Benay Spice, has already refilled medications for the patient

## 2015-07-19 NOTE — Telephone Encounter (Signed)
Pt calling for prescriptions for Anusol cream and suppositories for hemorrhoids. Also requests refill on canasa supp. States these were given to her by Dr. Benay Spice originally. Reports she is allergic to an ingredient in tucs and prep h. See note below that was sent by the pt to the cancer center yesterday. Please advise.  "My Pharmacy called a refill for Canasa Medication and have not heard from anyone yet. I am having problems and need this now."  Advised she contact GI for this request as indicated with refusal of refill on 07-18-2015.  "I do not understand why I need to do this when I haven't seen them. I can try but Dr. Benay Spice ordered this medicine. It may be cancer, I have stage IV systemic melanoma and this can pop up anywhere. I can come over now to see his PA if he needs to evaluate me first. What do you suggest I do?"  Suggested Tucks or advice from GI. Reports she is "allergic to preparation H and something in Tucks. Could I speak with Navigator to help understand your system because this doesn't seem reasonable. Dr. Benay Spice has worked in collaborative effort with Dr. Arsenio Katz at [facility]. These symptoms were caused by the immunotherapy I received so I want to treat it as such and if no relief I plan to see him as this may be cancer."

## 2015-07-19 NOTE — Telephone Encounter (Signed)
Please call patient in reference to refusal of yesterday's Canasa refill request.  "My Pharmacy called a refill for Canasa Medication and have not heard from anyone yet.  I am having problems and need this now."   Advised she contact GI for this request as indicated with refusal of refill on 07-18-2015.  "I do not understand why I need to do this when I haven't seen them.  I can try but Dr. Benay Spice ordered this medicine.  It may be cancer, I have stage IV systemic melanoma and this can pop up anywhere.  I can come over now to see his PA if he needs to evaluate me first.  What do you suggest I do?"  Suggested Tucks or advice from GI.  Reports she is "allergic to preparation H and something in Tucks.  Could I speak with Navigator to help understand your system because this doesn't seem reasonable.  Dr. Benay Spice has worked in collaborative effort with Dr. Arsenio Katz at [facility].  These symptoms were caused by the immunotherapy I received so I want to treat it as such and if no relief I plan to see him as this may be cancer."    Unable to transfer live call to Navigator or Collaborative at this time.  Will notify staff so she may receive a return call to mobile number [phone] with any new orders or instructions.

## 2015-07-19 NOTE — Telephone Encounter (Signed)
Per Dr. Benay Spice, refill for Anusol, Mesalamine supp and Hydrocortisone 2.5% cream OK to be refilled for pt at this time.  Call placed to pt to inform her of OK for refills and pt appreciative of call and refills.

## 2015-07-26 DIAGNOSIS — M7130 Other bursal cyst, unspecified site: Secondary | ICD-10-CM | POA: Diagnosis not present

## 2015-07-26 DIAGNOSIS — Z85820 Personal history of malignant melanoma of skin: Secondary | ICD-10-CM | POA: Diagnosis not present

## 2015-07-26 DIAGNOSIS — Z85828 Personal history of other malignant neoplasm of skin: Secondary | ICD-10-CM | POA: Diagnosis not present

## 2015-07-29 ENCOUNTER — Encounter: Payer: Self-pay | Admitting: Internal Medicine

## 2015-07-29 ENCOUNTER — Ambulatory Visit (INDEPENDENT_AMBULATORY_CARE_PROVIDER_SITE_OTHER): Payer: Medicare Other | Admitting: Internal Medicine

## 2015-07-29 VITALS — BP 120/88 | HR 69 | Ht 63.5 in | Wt 152.4 lb

## 2015-07-29 DIAGNOSIS — G909 Disorder of the autonomic nervous system, unspecified: Secondary | ICD-10-CM | POA: Diagnosis not present

## 2015-07-29 DIAGNOSIS — I471 Supraventricular tachycardia: Secondary | ICD-10-CM

## 2015-07-29 DIAGNOSIS — G901 Familial dysautonomia [Riley-Day]: Secondary | ICD-10-CM

## 2015-07-29 DIAGNOSIS — Z950 Presence of cardiac pacemaker: Secondary | ICD-10-CM

## 2015-07-29 NOTE — Progress Notes (Signed)
Patient Care Team: Burnis Medin, MD as PCP - General [name], MD as Consulting Physician (Neurosurgery) [name], MD as Consulting Physician (Oncology) [name], MD as Referring Physician (Internal Medicine)   HPI  [name] is a 60 y.o. female Seen for device interrogation following cranial radiation recently identified metastatic melanoma.  She has a history of a pacemaker for sinus node dysfunction in the setting of dysautonomia  Past Medical History  Diagnosis Date  . 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 Hypoglycemia, unspecified   . Other nonspecific abnormal serum enzyme levels   . Cervicalgia   . Disturbance of skin sensation   . Other specified congenital anomalies of nervous system   . Swelling of limb   . Disorder of bone and cartilage, unspecified   . Atrial tachycardia ([place])   . CVA (cerebral infarction)     2012 with dizziness and vision change felt embolic from atrial tachy  . History of pacemaker   . POTS (postural orthostatic tachycardia syndrome)   . Diverticulosis   . Osteopenia   . DVT (deep venous thrombosis) ([place])   . Atrial fibrillation ([place])   . DM (diabetes mellitus) ([place])   . DVT (deep venous thrombosis) ([place])   . HLD (hyperlipidemia)   . HTN (hypertension)   . Pacemaker     autonomic dysfunction  . Complication of anesthesia     pt states wakes up with "shakes"  . PONV (postoperative nausea and vomiting)   . Stroke [facility])     left weaker   . History of radiation therapy 10/24/12    brain  . Skin cancer     Hx: of lung lesion  . Family history of anesthesia complication     PONV  . Asthma   . Pancreatitis     from therapy    Past Surgical History  Procedure Laterality Date  . Insert / replace / remove pacemaker  2012    1999, x 3  . Ablation saphenous vein w/ rfa      2002  . Tonsillectomy and adenoidectomy    . Carpal tunnel release Left   . Nose surgery  2010, 2012   for nose bleeds x 2  . Fatty tumor removed      from chest  . Melanoma excision      with removal of lymph nodes, left shoulder  . Deep axillary sentinel node biopsy / excision      due to extensive Melanoma-right arm  . Laparoscopy  09/06/2011    Procedure: LAPAROSCOPY OPERATIVE;  Surgeon: Claiborne Billings A. Pamala Hurry, MD;  Location: [facility];  Service: Gynecology;  Laterality: Left;  with Left Ovarian Cystectomy   . Craniotomy N/A 09/30/2012    suboccipital craniectomy  . Craniotomy Left 02/09/2013    Procedure: LEFT PARIETAL CRANIOTOMY with stealth;  Surgeon: [name], MD;  Location: [facility];  Service: Neurosurgery;  Laterality: Left;  LEFT Parietal Craniotomy for tumor with stealth    Current Outpatient Prescriptions  Medication Sig Dispense Refill  . acetaminophen (TYLENOL) 500 MG tablet Take 1,000 mg by mouth every 6 (six) hours as needed for pain.    Marland Kitchen albuterol (VENTOLIN HFA) 108 (90 BASE) MCG/ACT inhaler Inhale 2 puffs into the lungs as needed.    . bimatoprost (LATISSE) 0.03 % ophthalmic solution Apply 1 application topically as needed.     . clonazePAM (KLONOPIN) 0.5 MG tablet Take 1 tablet (0.5 mg total)  by mouth 3 (three) times daily as needed for anxiety. 50 tablet 0  . cloNIDine (CATAPRES) 0.1 MG tablet TAKE ONE TABLET BY MOUTH NIGHTLY AT BEDTIME 90 tablet 2  . cyanocobalamin (,VITAMIN B-12,) 1000 MCG/ML injection ADMINISTER 1 ML UNDER THE SKIN 1 TIME A MONTH AS DIRECTED 1 mL 0  . diphenhydrAMINE (BENADRYL) 25 MG tablet Take 12.5 mg by mouth every 6 (six) hours as needed. Seasonal allergies    . esomeprazole (NEXIUM) 40 MG capsule Take 1 capsule (40 mg total) by mouth 2 (two) times daily before a meal. 60 capsule 2  . esomeprazole (NEXIUM) 40 MG capsule TAKE ONE CAPSULE BY MOUTH TWICE DAILY BEFORE MEALS 60 capsule 2  . ferrous sulfate 325 (65 FE) MG EC tablet Take 1 tablet (325 mg total) by mouth daily with breakfast.  3  . furosemide (LASIX) 40 MG tablet Take 20 mg by mouth  daily. Takes PRN    . hydrocortisone (ANUSOL-HC) 25 MG suppository Place 1 suppository (25 mg total) rectally at bedtime as needed for hemorrhoids. 12 suppository 0  . hydrocortisone 2.5 % cream Apply 1 application topically 2 (two) times daily as needed (irritation). 30 g 0  . levETIRAcetam (KEPPRA) 500 MG tablet Take 500 mg by mouth 2 (two) times daily.     . mesalamine (CANASA) 1000 MG suppository Place 1 suppository (1,000 mg total) rectally at bedtime as needed (hemorrhoids). 30 suppository 0  . metoprolol tartrate (LOPRESSOR) 25 MG tablet Take 1 tablet (25 mg total) by mouth 2 (two) times daily. 180 tablet 0  . mometasone (NASONEX) 50 MCG/ACT nasal spray Place 2 sprays into the nose daily.    Marland Kitchen nystatin (MYCOSTATIN) 100000 UNIT/ML suspension Take 5 mLs (500,000 Units total) by mouth 4 (four) times daily. (Patient taking differently: Take 5 mLs by mouth as needed. ) 60 mL 0  . ondansetron (ZOFRAN-ODT) 4 MG disintegrating tablet Take 4 mg by mouth as needed. '4mg'$  ODT q4 hours prn nausea/vomit    . polyethylene glycol powder (GLYCOLAX/MIRALAX) powder Stir 17 grams in 8oz of water, or juice, 1 or 2 times daily for constipation PRN    . potassium chloride SA (K-DUR,KLOR-CON) 20 MEQ tablet TAKE ONE TABLET BY MOUTH TWICE DAILY 60 tablet 3  . Vitamin D, Ergocalciferol, (DRISDOL) 50000 UNITS CAPS capsule Take 50,000 Units by mouth every 7 (seven) days.     No current facility-administered medications for this visit.    Allergies  Allergen Reactions  . Doxycycline Hives  . Erythromycin Hives  . Penicillins Hives  . Preparation H [Pramox-Pe-Glycerin-Petrolatum] Hives    Cannot use  Cream or Suppositories   . Promethazine     Other reaction(s): Other (See Comments) Causes restless leg syndrome  . Sulfonamide Derivatives Hives  . Phenergan [Promethazine Hcl] Other (See Comments)    Causes restless leg syndrome    Review of Systems negative except from HPI and PMH  Physical Exam BP 120/88  mmHg  Pulse 69  Ht 5' 3.5" (1.613 m)  Wt 152 lb 6.4 oz (69.128 kg)  BMI 26.57 kg/m2 Device pocket well healed; without hematoma or erythema.  There is no tethering  RRR No edema   Assessment and  Plan  Atrial  Tach  Pacemaker   Stable

## 2015-07-29 NOTE — Patient Instructions (Signed)
Medication Instructions: - Your physician recommends that you continue on your current medications as directed. Please refer to the Current Medication list given to you today.  Labwork: - none  Procedures/Testing: - none  Follow-Up: - Your physician wants you to follow-up in: 1 year with Dr. Caryl Comes. You will receive a reminder letter in the mail two months in advance. If you don't receive a letter, please call our office to schedule the follow-up appointment.   Any Additional Special Instructions Will Be Listed Below (If Applicable).     If you need a refill on your cardiac medications before your next appointment, please call your pharmacy.

## 2015-08-11 DIAGNOSIS — C439 Malignant melanoma of skin, unspecified: Secondary | ICD-10-CM | POA: Diagnosis not present

## 2015-08-12 ENCOUNTER — Telehealth: Payer: Self-pay | Admitting: Internal Medicine

## 2015-08-12 NOTE — Telephone Encounter (Signed)
Pt states she is being treated at [facility] for stage 4 cancer with Immunotherapy. Pt reports she has had problems with pancreatitis, colitis, and IBS with the Immunotherapy. States her [facility] doc wanted her to be seen by her GI physician to see what he thought she might need to do, mentioned Bentyl. Pt wants to be seen by Dr. Hilarie Fredrickson. First available is 10/18/15 but pt states she needs to be seen sooner, returns to [facility] in Sept. Please advise if she can be worked in sooner with you.

## 2015-08-16 NOTE — Telephone Encounter (Signed)
I apologize for the difficulty with scheduling and the wait to be seen by me personally in clinic. Due to hospital weeks in June and July, my office availability is reduced by 25% in those months. I think the best option would be to book her to see Nicoletta Ba on a day that I am in clinic. I could at least introduce myself and discuss in real time with Amy on that day. Please see if this would be acceptable to her Thanks

## 2015-08-16 NOTE — Telephone Encounter (Signed)
Pt is calling back to see if she can be "worked in sooner"

## 2015-08-16 NOTE — Telephone Encounter (Signed)
Pt scheduled to see Amy Esterwood PA 08/22/15'@1'$ :30pm. Pt aware of appt. Dr. Hilarie Fredrickson to meet pt at this visit also.

## 2015-08-22 ENCOUNTER — Ambulatory Visit: Payer: BLUE CROSS/BLUE SHIELD | Admitting: Physician Assistant

## 2015-09-02 ENCOUNTER — Other Ambulatory Visit: Payer: Self-pay

## 2015-09-02 NOTE — Telephone Encounter (Signed)
Routed to [place] to advise on how to fill

## 2015-09-08 ENCOUNTER — Other Ambulatory Visit: Payer: Self-pay

## 2015-09-08 ENCOUNTER — Other Ambulatory Visit: Payer: Self-pay | Admitting: Radiation Oncology

## 2015-09-08 DIAGNOSIS — I10 Essential (primary) hypertension: Secondary | ICD-10-CM | POA: Diagnosis not present

## 2015-09-08 DIAGNOSIS — Z950 Presence of cardiac pacemaker: Secondary | ICD-10-CM | POA: Diagnosis not present

## 2015-09-08 DIAGNOSIS — K769 Liver disease, unspecified: Secondary | ICD-10-CM | POA: Diagnosis not present

## 2015-09-08 DIAGNOSIS — Z6827 Body mass index (BMI) 27.0-27.9, adult: Secondary | ICD-10-CM | POA: Diagnosis not present

## 2015-09-08 DIAGNOSIS — R918 Other nonspecific abnormal finding of lung field: Secondary | ICD-10-CM | POA: Diagnosis not present

## 2015-09-08 DIAGNOSIS — K589 Irritable bowel syndrome without diarrhea: Secondary | ICD-10-CM | POA: Diagnosis not present

## 2015-09-08 DIAGNOSIS — Z7984 Long term (current) use of oral hypoglycemic drugs: Secondary | ICD-10-CM | POA: Diagnosis not present

## 2015-09-08 DIAGNOSIS — C7931 Secondary malignant neoplasm of brain: Secondary | ICD-10-CM | POA: Diagnosis not present

## 2015-09-08 DIAGNOSIS — Z79899 Other long term (current) drug therapy: Secondary | ICD-10-CM | POA: Diagnosis not present

## 2015-09-08 DIAGNOSIS — C716 Malignant neoplasm of cerebellum: Secondary | ICD-10-CM | POA: Diagnosis not present

## 2015-09-08 DIAGNOSIS — Z800 Family history of malignant neoplasm of digestive organs: Secondary | ICD-10-CM | POA: Diagnosis not present

## 2015-09-08 DIAGNOSIS — C439 Malignant melanoma of skin, unspecified: Secondary | ICD-10-CM | POA: Diagnosis not present

## 2015-09-08 DIAGNOSIS — I6350 Cerebral infarction due to unspecified occlusion or stenosis of unspecified cerebral artery: Secondary | ICD-10-CM | POA: Diagnosis not present

## 2015-09-08 DIAGNOSIS — G9001 Carotid sinus syncope: Secondary | ICD-10-CM | POA: Diagnosis not present

## 2015-09-08 DIAGNOSIS — E041 Nontoxic single thyroid nodule: Secondary | ICD-10-CM | POA: Diagnosis not present

## 2015-09-08 DIAGNOSIS — L989 Disorder of the skin and subcutaneous tissue, unspecified: Secondary | ICD-10-CM | POA: Diagnosis not present

## 2015-09-08 MED ORDER — VITAMIN D (ERGOCALCIFEROL) 1.25 MG (50000 UNIT) PO CAPS: 50000.0000 [IU] | ORAL_CAPSULE | ORAL | Status: DC

## 2015-09-08 NOTE — Telephone Encounter (Signed)
Ok to refill 

## 2015-09-12 ENCOUNTER — Inpatient Hospital Stay
Admission: RE | Admit: 2015-09-12 | Discharge: 2015-09-12 | Disposition: A | Discharge status: Home / Self Care | Payer: Self-pay | Source: Ambulatory Visit | Attending: Radiation Oncology | Admitting: Radiation Oncology

## 2015-09-12 ENCOUNTER — Other Ambulatory Visit: Payer: Self-pay | Admitting: Radiation Oncology

## 2015-09-12 DIAGNOSIS — C801 Malignant (primary) neoplasm, unspecified: Secondary | ICD-10-CM

## 2015-09-12 DIAGNOSIS — C9000 Multiple myeloma not having achieved remission: Secondary | ICD-10-CM

## 2015-10-12 ENCOUNTER — Telehealth: Payer: Self-pay | Admitting: Internal Medicine

## 2015-10-12 NOTE — Telephone Encounter (Signed)
[name] from [facility] called to have pt scheduled for an MRI. Dr Caryl Comes and the rep needs to be there when is done. Pt has a pacemaker. The dates that [name]  are  September 21 st and 22 Nd. [name] would like for Amanda Lemmings RN Dr. Olin Pia nurse to call her.

## 2015-10-13 ENCOUNTER — Other Ambulatory Visit: Payer: Self-pay | Admitting: Radiation Therapy

## 2015-10-13 ENCOUNTER — Telehealth: Payer: Self-pay | Admitting: *Deleted

## 2015-10-13 DIAGNOSIS — C7931 Secondary malignant neoplasm of brain: Secondary | ICD-10-CM

## 2015-10-13 DIAGNOSIS — C7949 Secondary malignant neoplasm of other parts of nervous system: Principal | ICD-10-CM

## 2015-10-13 NOTE — Telephone Encounter (Signed)
Message from [name], [place] navigator reporting pt would like to move office visit to after 9/21 MRI. Order to schedulers to call pt with new appt.

## 2015-10-13 NOTE — Telephone Encounter (Signed)
I called and spoke with [name]- per Dr. Caryl Comes- ok for 9/21 for MRI. I advised [name] around 6:30 pm. She will call back if there is any problem with this, otherwise will notify [employer] rep.  Message sent to [facility] with [place].

## 2015-10-14 ENCOUNTER — Telehealth: Payer: Self-pay | Admitting: Oncology

## 2015-10-14 NOTE — Telephone Encounter (Signed)
Called patient to confirm appt. L/M. Appt schd and ltr mailed. 10/14/15

## 2015-10-17 ENCOUNTER — Ambulatory Visit: Payer: BLUE CROSS/BLUE SHIELD | Admitting: Radiation Oncology

## 2015-10-17 ENCOUNTER — Telehealth: Payer: Self-pay | Admitting: *Deleted

## 2015-10-17 NOTE — Telephone Encounter (Signed)
Faxed to [facility] [phone] on [address],[place] [phone]  Rx: Esomeprazole mag DR 40 mg capsule, quantity "180, take 1 capsule  By mouth twice daily before meals per Dr. Eula Listen on rx request form

## 2015-10-18 ENCOUNTER — Telehealth: Payer: Self-pay | Admitting: *Deleted

## 2015-10-18 NOTE — Telephone Encounter (Signed)
Called [facility] [phone] with Gerald Stabs Pharmacist to verify faxed copy of RX Esomeprazole mag dr 40 cap, that was done yesterday at 1:10pm pm, got verified  [place], per Gerald Stabs he didn't get faxed copy, gave him  rx order from Dr,Moody, will refax again today, they wil  9:39 AM lnotify the patient,

## 2015-10-26 ENCOUNTER — Other Ambulatory Visit: Payer: Self-pay | Admitting: Internal Medicine

## 2015-10-26 ENCOUNTER — Other Ambulatory Visit: Payer: Self-pay | Admitting: Oncology

## 2015-10-30 ENCOUNTER — Other Ambulatory Visit: Payer: Self-pay | Admitting: Internal Medicine

## 2015-11-01 DIAGNOSIS — E119 Type 2 diabetes mellitus without complications: Secondary | ICD-10-CM | POA: Insufficient documentation

## 2015-11-01 NOTE — Telephone Encounter (Signed)
Ok to refill? Please advise. Thanks, MI

## 2015-11-02 NOTE — Telephone Encounter (Signed)
Ok to refill 

## 2015-11-03 DIAGNOSIS — C7931 Secondary malignant neoplasm of brain: Secondary | ICD-10-CM | POA: Diagnosis not present

## 2015-11-03 DIAGNOSIS — I10 Essential (primary) hypertension: Secondary | ICD-10-CM | POA: Diagnosis not present

## 2015-11-03 DIAGNOSIS — Z6827 Body mass index (BMI) 27.0-27.9, adult: Secondary | ICD-10-CM | POA: Diagnosis not present

## 2015-11-03 DIAGNOSIS — G9001 Carotid sinus syncope: Secondary | ICD-10-CM | POA: Diagnosis not present

## 2015-11-03 DIAGNOSIS — J309 Allergic rhinitis, unspecified: Secondary | ICD-10-CM | POA: Diagnosis not present

## 2015-11-03 DIAGNOSIS — K449 Diaphragmatic hernia without obstruction or gangrene: Secondary | ICD-10-CM | POA: Diagnosis not present

## 2015-11-03 DIAGNOSIS — F419 Anxiety disorder, unspecified: Secondary | ICD-10-CM | POA: Diagnosis not present

## 2015-11-03 DIAGNOSIS — Z800 Family history of malignant neoplasm of digestive organs: Secondary | ICD-10-CM | POA: Diagnosis not present

## 2015-11-03 DIAGNOSIS — Z8719 Personal history of other diseases of the digestive system: Secondary | ICD-10-CM | POA: Diagnosis not present

## 2015-11-03 DIAGNOSIS — E119 Type 2 diabetes mellitus without complications: Secondary | ICD-10-CM | POA: Diagnosis not present

## 2015-11-03 DIAGNOSIS — K5730 Diverticulosis of large intestine without perforation or abscess without bleeding: Secondary | ICD-10-CM | POA: Diagnosis not present

## 2015-11-03 DIAGNOSIS — M25511 Pain in right shoulder: Secondary | ICD-10-CM | POA: Diagnosis not present

## 2015-11-03 DIAGNOSIS — C439 Malignant melanoma of skin, unspecified: Secondary | ICD-10-CM | POA: Diagnosis not present

## 2015-11-03 DIAGNOSIS — Z7951 Long term (current) use of inhaled steroids: Secondary | ICD-10-CM | POA: Diagnosis not present

## 2015-11-03 DIAGNOSIS — R202 Paresthesia of skin: Secondary | ICD-10-CM | POA: Diagnosis not present

## 2015-11-03 DIAGNOSIS — K580 Irritable bowel syndrome with diarrhea: Secondary | ICD-10-CM | POA: Diagnosis not present

## 2015-11-03 DIAGNOSIS — Z950 Presence of cardiac pacemaker: Secondary | ICD-10-CM | POA: Diagnosis not present

## 2015-11-03 DIAGNOSIS — J45909 Unspecified asthma, uncomplicated: Secondary | ICD-10-CM | POA: Diagnosis not present

## 2015-11-03 DIAGNOSIS — Z79899 Other long term (current) drug therapy: Secondary | ICD-10-CM | POA: Diagnosis not present

## 2015-11-03 DIAGNOSIS — J702 Acute drug-induced interstitial lung disorders: Secondary | ICD-10-CM | POA: Diagnosis not present

## 2015-11-03 DIAGNOSIS — G5793 Unspecified mononeuropathy of bilateral lower limbs: Secondary | ICD-10-CM | POA: Diagnosis not present

## 2015-11-03 DIAGNOSIS — M7989 Other specified soft tissue disorders: Secondary | ICD-10-CM | POA: Diagnosis not present

## 2015-11-10 ENCOUNTER — Ambulatory Visit: Payer: Medicare Other | Admitting: Oncology

## 2015-11-10 ENCOUNTER — Telehealth: Payer: Self-pay | Admitting: Internal Medicine

## 2015-11-10 NOTE — Telephone Encounter (Signed)
I left a message for [name] from the [facility] to call.  Dr. Caryl Comes is scheduled to sit in on the patient MRI on 11/17/15 at 6:30 pm. He sent me a message today that he will need to do a different day due to another engagement.

## 2015-11-11 NOTE — Telephone Encounter (Signed)
Late entry- I spoke with [name] at that [facility] yesterday afternoon. The patient's MRI has been r/s for 9/22 at 6:30 pm.  Dr. Caryl Comes and Ruby Cola with [employer] are aware.

## 2015-11-14 ENCOUNTER — Ambulatory Visit: Payer: Self-pay | Admitting: Radiation Oncology

## 2015-11-15 ENCOUNTER — Ambulatory Visit
Admission: RE | Admit: 2015-11-15 | Discharge: 2015-11-15 | Disposition: A | Discharge status: Home / Self Care | Payer: Self-pay | Source: Ambulatory Visit | Attending: Radiation Oncology | Admitting: Radiation Oncology

## 2015-11-15 ENCOUNTER — Encounter: Payer: Self-pay | Admitting: *Deleted

## 2015-11-15 ENCOUNTER — Other Ambulatory Visit: Payer: Self-pay | Admitting: Oncology

## 2015-11-15 ENCOUNTER — Ambulatory Visit (HOSPITAL_BASED_OUTPATIENT_CLINIC_OR_DEPARTMENT_OTHER): Payer: BLUE CROSS/BLUE SHIELD

## 2015-11-15 ENCOUNTER — Telehealth: Payer: Self-pay | Admitting: *Deleted

## 2015-11-15 ENCOUNTER — Other Ambulatory Visit: Payer: Self-pay | Admitting: *Deleted

## 2015-11-15 ENCOUNTER — Other Ambulatory Visit: Payer: Self-pay | Admitting: Radiation Oncology

## 2015-11-15 DIAGNOSIS — C799 Secondary malignant neoplasm of unspecified site: Secondary | ICD-10-CM

## 2015-11-15 DIAGNOSIS — C439 Malignant melanoma of skin, unspecified: Secondary | ICD-10-CM

## 2015-11-15 DIAGNOSIS — C7931 Secondary malignant neoplasm of brain: Secondary | ICD-10-CM

## 2015-11-15 DIAGNOSIS — R39198 Other difficulties with micturition: Secondary | ICD-10-CM

## 2015-11-15 LAB — URINALYSIS, MICROSCOPIC - CHCC
Bilirubin (Urine): NEGATIVE
Blood: NEGATIVE
Glucose: NEGATIVE mg/dL
Ketones: NEGATIVE mg/dL
Nitrite: NEGATIVE
Protein: NEGATIVE mg/dL
Specific Gravity, Urine: 1.01 (ref 1.003–1.035)
Urobilinogen, UR: 0.2 mg/dL (ref 0.2–1)
pH: 6 (ref 4.6–8.0)

## 2015-11-15 NOTE — Progress Notes (Signed)
Pt waiting in lobby for u/a results.  Per Dr. Benay Spice, no need for antibiotics at this time, will wait for urine culture results.  Pt informed of MD instructions and has no questions at this time.

## 2015-11-15 NOTE — Telephone Encounter (Signed)
Message received from patient requesting a u/a d/t possible UTI.  Dr. Benay Spice notified and OK for patient to come in today for u/a.  Order placed and message sent to scheduler.

## 2015-11-16 ENCOUNTER — Telehealth: Payer: Self-pay | Admitting: Internal Medicine

## 2015-11-16 ENCOUNTER — Ambulatory Visit: Payer: BLUE CROSS/BLUE SHIELD | Admitting: Physical Therapy

## 2015-11-16 NOTE — Telephone Encounter (Signed)
Patient wants to know if MRI is still scheduled for Friday. Informed patient that as of right now MRI is on for Friday at 6:00 pm.

## 2015-11-16 NOTE — Telephone Encounter (Signed)
Follow up      Calling to let Dr Caryl Comes know MRI has been cancelled per neurosurgeon for 11-18-15.  Please let the device rep know also.

## 2015-11-16 NOTE — Telephone Encounter (Signed)
New message   Pt verbalized that she is calling about pt MRI appt and Dr.Klien and the appt need to be verified of time and date

## 2015-11-16 NOTE — Telephone Encounter (Signed)
Notified industry of MRI cancellation. Forward to [employer] as Juluis Rainier.

## 2015-11-17 ENCOUNTER — Telehealth: Payer: Self-pay | Admitting: *Deleted

## 2015-11-17 ENCOUNTER — Inpatient Hospital Stay (HOSPITAL_COMMUNITY): Admission: RE | Admit: 2015-11-17 | Payer: BLUE CROSS/BLUE SHIELD | Source: Ambulatory Visit

## 2015-11-17 LAB — URINE CULTURE

## 2015-11-17 NOTE — Telephone Encounter (Signed)
Dr. Caryl Comes is aware that this has been cancelled.

## 2015-11-17 NOTE — Telephone Encounter (Signed)
Patient notified that urine culture came back as contaminated per Dr. Benay Spice and that if she is still having difficulty with urination she may come in for another urinalysis and culture.  Patient states that she is feeling better and believes that she has a yeast infection and will start on her probiotics today and does not feel the need to come in for u/a at this time. Patient scheduled to see Dr. Benay Spice on Monday and aware of date and time.

## 2015-11-18 ENCOUNTER — Other Ambulatory Visit (HOSPITAL_COMMUNITY): Payer: BLUE CROSS/BLUE SHIELD

## 2015-11-21 ENCOUNTER — Encounter: Payer: Self-pay | Admitting: Genetic Counselor

## 2015-11-21 ENCOUNTER — Telehealth: Payer: Self-pay | Admitting: Oncology

## 2015-11-21 ENCOUNTER — Ambulatory Visit (HOSPITAL_BASED_OUTPATIENT_CLINIC_OR_DEPARTMENT_OTHER): Payer: BLUE CROSS/BLUE SHIELD | Admitting: Oncology

## 2015-11-21 ENCOUNTER — Inpatient Hospital Stay: Admission: RE | Admit: 2015-11-21 | Payer: Self-pay | Source: Ambulatory Visit | Admitting: Radiation Oncology

## 2015-11-21 VITALS — BP 121/88 | HR 115 | Temp 97.7°F | Resp 17 | Ht 63.5 in | Wt 153.8 lb

## 2015-11-21 DIAGNOSIS — C7931 Secondary malignant neoplasm of brain: Secondary | ICD-10-CM | POA: Diagnosis not present

## 2015-11-21 DIAGNOSIS — C439 Malignant melanoma of skin, unspecified: Secondary | ICD-10-CM

## 2015-11-21 DIAGNOSIS — C799 Secondary malignant neoplasm of unspecified site: Secondary | ICD-10-CM | POA: Diagnosis not present

## 2015-11-21 NOTE — Telephone Encounter (Signed)
03.26.2018 Appointment scheduled per 09/25 LOS. Patient was given AVS and future scheduled appointments.

## 2015-11-21 NOTE — Progress Notes (Signed)
[facility] OFFICE PROGRESS NOTE   Diagnosis: Melanoma  INTERVAL HISTORY:   [name] returns as scheduled. She continues to have a diminished energy level. She had dysuria last week. A urine culture on 11/15/2015 revealed mixed flora. She reports the dysuria resolved. She was seen at [facility] on 11/03/2015. CT scans revealed no evidence of recurrent disease. The LDH returned normal. She reports burning and itching at the right arm scar last week. She completed a course of nystatin for treatment of oral candidiasis. No seizures. No diarrhea. Objective:  Vital signs in last 24 hours:  Blood pressure 121/88, pulse (!) 115, temperature 97.7 F (36.5 C), temperature source Oral, resp. rate 17, height 5' 3.5" (1.613 m), weight 153 lb 12.8 oz (69.8 kg), SpO2 100 %. Manual pulse-64    HEENT:  neck without mass Lymphatics:  no cervical, supraclavicular, axillary, or inguinal nodes Resp:  lungs clear bilaterally Cardio:  regular rate and rhythm GI:  no hepatosplenomegaly, nontender Vascular:  no leg edema  Skin: Right arm scar without evidence of recurrent tumor  Lab Results:  Lab Results  Component Value Date   WBC 5.4 05/27/2015   HGB 14.4 05/27/2015   HCT 41.7 05/27/2015   MCV 87.4 05/27/2015   PLT 253 05/27/2015   NEUTROABS 2.8 05/27/2015     Medications: I have reviewed the patient's current medications.  Assessment/Plan: 1.Metastatic melanoma  -Status post resection of a right arm melanoma, stage II A (T2b N0) in December 2009  -Metastatic melanoma, status post resection of a left cerebellar metastasis 09/30/2012, BRAF mutation identified  -Status post SRS treatment of the left cerebellum and a left parietal metastasis 10/24/2012  -Staging CTs and PET scan without evidence of distant metastatic disease aside from indeterminate small lung nodules  -Restaging CTs 12/01/2012 consistent with enlargement of the left parietal mass and right lower lobe lung  nodule  -Initiation of dabrafenib and tremetinib 12/17/2012. She reports extremely poor tolerance. Discontinued.  -Status post resection of a left parietal metastasis 02/09/2013.  -Restaging CT evaluation 03/03/2013. Stable 1.5 cm right lower lobe pulmonary nodule. Multiple peripheral enhancing lesions throughout the liver.  -Initiation of Ipilimumab at [facility] 03/10/2013.  -Cycle 2 Ipilumumab 04/17/2013  -Cycle 3 Ipilumumab 05/14/2013  -Cycle 4 Ipilumumab 06/04/2013  -Restaging CT at [facility] 06/25/2013 with multiple subcentimeter liver lesions, no new lesions, decreased right lower lobe nodule.  -Brain MRI 07/07/2013 with no evidence of progressive metastatic disease.  -Brain CT 10/09/2013 with no evidence of progressive metastatic disease  -Brain CT and CTs of the chest, abdomen, and pelvis at [facility] on 11/03/2013 with no evidence of progressive disease  -CTs of the brain, chest, abdomen, and pelvis at [facility] on 02/02/2014 with no evidence of disease progression -CTs of the brain, chest, abdomen, and pelvis at [facility] on 05/04/2014-no evidence of disease progression -CTs of the brain, chest, abdomen, and pelvis at [facility] on 08/03/2014-stable -CTs of the brain, chest, abdomen, and pelvis at [facility] on 11/02/2014-stable -CTs of the brain, chest, abdomen, and pelvis at [facility] on 02/08/2015-stable -CTs of the brain, chest, abdomen, and pelvis at [facility] on 05/10/2015-stable  2. Arrhythmia, pacemaker in place.  3. Hypertension  4. History of a CVA  5. History of deep vein thrombosis  6. History of proximal leg weakness-likely related to chronic steroid use.  7. Intermittent rash at the posterior thighs and left hand-likely related to systemic therapy.  8. Leg edema-? Related to liver disease,? Secondary to ipilumumab . Improved on furosemide.  9. Headache  and mild nausea-coincided with discontinuation of Decadron, CT of the brain 03/25/2011 with no acute change.  10. Diarrhea secondary to  Ipilumumab-resolved 11. History of Mild elevation of lipase-likely mild asymptomatic pancreatitis related to Ipilumumab  12.  history of Red cell microcytosis and iron deficiency 03/17/2014- negative stool Hemoccults, improved     Disposition:   She remains in clinical remission from melanoma. She will continue lab and imaging follow-up with Dr.Collichio. She will return for an office visit in 6 months. She will contact us for recurrent dysuria. I recommended an influenza vaccine. She will think about this and contact us, but she does not wish to receive an influenza vaccine today.  Amanda Coder, MD  11/21/2015  3:23 PM

## 2015-11-23 ENCOUNTER — Ambulatory Visit: Payer: Medicare Other | Attending: Internal Medicine | Admitting: Physical Therapy

## 2015-11-23 ENCOUNTER — Encounter: Payer: Self-pay | Admitting: Internal Medicine

## 2015-11-23 DIAGNOSIS — D485 Neoplasm of uncertain behavior of skin: Secondary | ICD-10-CM | POA: Diagnosis not present

## 2015-11-23 DIAGNOSIS — M25511 Pain in right shoulder: Secondary | ICD-10-CM

## 2015-11-23 DIAGNOSIS — M79672 Pain in left foot: Secondary | ICD-10-CM | POA: Diagnosis not present

## 2015-11-23 DIAGNOSIS — M71342 Other bursal cyst, left hand: Secondary | ICD-10-CM | POA: Diagnosis not present

## 2015-11-23 DIAGNOSIS — M79671 Pain in right foot: Secondary | ICD-10-CM | POA: Diagnosis not present

## 2015-11-23 DIAGNOSIS — I890 Lymphedema, not elsewhere classified: Secondary | ICD-10-CM

## 2015-11-23 DIAGNOSIS — L821 Other seborrheic keratosis: Secondary | ICD-10-CM | POA: Diagnosis not present

## 2015-11-23 DIAGNOSIS — Z85828 Personal history of other malignant neoplasm of skin: Secondary | ICD-10-CM | POA: Diagnosis not present

## 2015-11-23 DIAGNOSIS — M71341 Other bursal cyst, right hand: Secondary | ICD-10-CM | POA: Diagnosis not present

## 2015-11-23 DIAGNOSIS — C44519 Basal cell carcinoma of skin of other part of trunk: Secondary | ICD-10-CM | POA: Diagnosis not present

## 2015-11-23 DIAGNOSIS — Z85820 Personal history of malignant melanoma of skin: Secondary | ICD-10-CM | POA: Diagnosis not present

## 2015-11-23 NOTE — Therapy (Signed)
[place], [place], [address] Phone: [phone]   Fax:  [phone]  Physical Therapy Evaluation  Patient Details  Name: [name] MRN: [id number] Date of Birth: 03/03/1955 Referring Provider: [name], MD  Encounter Date: 11/23/2015      PT End of Session - 11/23/15 1245    Visit Number 1   Number of Visits 9   Date for PT Re-Evaluation 12/23/15   PT Start Time 1107   PT Stop Time 1210   PT Time Calculation (min) 63 min   Activity Tolerance Patient tolerated treatment well   Behavior During Therapy [facility] for tasks assessed/performed      Past Medical History:  Diagnosis Date  . Asthma   . Atrial fibrillation ([place])   . Atrial tachycardia ([place])   . Cervicalgia   . Complication of anesthesia    pt states wakes up with "shakes"  . CVA (cerebral infarction)    2012 with dizziness and vision change felt embolic from atrial tachy  . Disorder of bone and cartilage, unspecified   . Disturbance of skin sensation   . Diverticulosis   . DM (diabetes mellitus) ([place])   . DVT (deep venous thrombosis) ([place])   . DVT (deep venous thrombosis) ([place])   . Family history of anesthesia complication    PONV  . History of pacemaker   . History of radiation therapy 10/24/12   brain  . HLD (hyperlipidemia)   . HTN (hypertension)   . Hypoglycemia, unspecified   . 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 Osteopenia   . Other nonspecific abnormal serum enzyme levels   . Other specified congenital anomalies of nervous system   . Pacemaker    autonomic dysfunction  . Pancreatitis    from therapy  . PONV (postoperative nausea and vomiting)   . POTS (postural orthostatic tachycardia syndrome)   . Skin cancer    Hx: of lung lesion  . Stroke [facility])    left weaker   . Swelling of limb     Past Surgical History:  Procedure Laterality Date  . ABLATION SAPHENOUS VEIN W/  RFA     2002  . CARPAL TUNNEL RELEASE Left   . CRANIOTOMY N/A 09/30/2012   suboccipital craniectomy  . CRANIOTOMY Left 02/09/2013   Procedure: LEFT PARIETAL CRANIOTOMY with stealth;  Surgeon: [name], MD;  Location: [place] [facility];  Service: Neurosurgery;  Laterality: Left;  LEFT Parietal Craniotomy for tumor with stealth  . DEEP AXILLARY SENTINEL NODE BIOPSY / EXCISION     due to extensive Melanoma-right arm  . fatty tumor removed     from chest  . INSERT / REPLACE / REMOVE PACEMAKER  2012   1999, x 3  . LAPAROSCOPY  09/06/2011   Procedure: LAPAROSCOPY OPERATIVE;  Surgeon: Claiborne Billings A. Pamala Hurry, MD;  Location: [place] [facility];  Service: Gynecology;  Laterality: Left;  with Left Ovarian Cystectomy   . MELANOMA EXCISION     with removal of lymph nodes, left shoulder  . NOSE SURGERY  2010, 2012   for nose bleeds x 2  . TONSILLECTOMY AND ADENOIDECTOMY      There were no vitals filed for this visit.       Subjective Assessment - 11/23/15 1111    Subjective Original melanoma incision on right shoulder became painful and swollen, was stinging especially in the shower. Has lymphedema in right arm from surgery. Also has swelling in  ankles from immunotherapy. Patient states she does not have much swelling today. [facility] where she was doing therapy with infrared boots to help with her neuropathy. She has compression socks that she wears when it is not hot for the swelling in her ankles.   Pertinent History Patient with history of Stage IV melanoma of right shoulder with sentinel lymph node biopsy in 2009.  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 Has had 2 brain surgeries. Patient states the cancer is also in her blood.  She has a history of orthostatic intolerance with pacemaker placed and medications to resolve this.   Patient Stated Goals want to get right arm and feet moving more, get swelling in ankles down   Currently in Pain? No/denies            [facility] PT  Assessment - 11/23/15 0001      Assessment   Medical Diagnosis melanoma   Referring Provider [name], MD   Onset Date/Surgical Date 01/27/08  approximately   Hand Dominance Right     Precautions   Precautions ICD/Pacemaker;Other (comment)   Precaution Comments cancer precautions     Restrictions   Weight Bearing Restrictions No     Balance Screen   Has the patient fallen in the past 6 months No   Has the patient had a decrease in activity level because of a fear of falling?  No   Is the patient reluctant to leave their home because of a fear of falling?  No     Home Ecologist residence   Living Arrangements Spouse/significant other   Type of Home Other(Comment)  [place] Access Level entry   [place] Two level     Prior Function   Level of [place] On disability   Leisure walks about 2.5-3 miles on the treadmill at least 3 times per week     Cognition   Overall Cognitive Status Within Functional Limits for tasks assessed     Posture/Postural Control   Posture Comments forward head, rounded shoulders, increased kyphosis     ROM / Strength   AROM / PROM / Strength AROM;Strength     AROM   AROM Assessment Site Shoulder   Right/Left Shoulder Right;Left   Right Shoulder Flexion 139 Degrees   Right Shoulder ABduction 152 Degrees   Left Shoulder Flexion 138 Degrees   Left Shoulder ABduction 154 Degrees     Strength   Strength Assessment Site Shoulder;Elbow;Hand   Right/Left Shoulder Right;Left   Right Shoulder Flexion 5/5   Right Shoulder ABduction 5/5   Right/Left Elbow Right;Left   Right Elbow Flexion 5/5   Right Elbow Extension 4+/5   Right/Left hand Right;Left   Right Hand Grip (lbs) 49   Left Hand Grip (lbs) 43           LYMPHEDEMA/ONCOLOGY QUESTIONNAIRE - 11/23/15 1121      Lymphedema Assessments   Lymphedema Assessments Upper extremities;Lower extremities     Right  Upper Extremity Lymphedema   At Axilla  30.7 cm   15 cm Proximal to Olecranon Process 28.5 cm   10 cm Proximal to Olecranon Process 26.2 cm   Olecranon Process 24.3 cm   15 cm Proximal to Ulnar Styloid Process 23.3 cm   10 cm Proximal to Ulnar Styloid Process 21 cm   Just Proximal to Ulnar Styloid Process 15.1 cm   Across Hand at [employer]  Space 18.9 cm   At [facility] of 2nd Digit 5.8 cm   Other around axilla coming up around shoulder at acromion process: 38.9     Left Upper Extremity Lymphedema   At Axilla  32.7 cm   15 cm Proximal to Olecranon Process 27.9 cm   10 cm Proximal to Olecranon Process 25.8 cm   Olecranon Process 23.9 cm   15 cm Proximal to Ulnar Styloid Process 22.7 cm   10 cm Proximal to Ulnar Styloid Process 20.8 cm   Just Proximal to Ulnar Styloid Process 15 cm   Across Hand at [employer] 18.9 cm   At [place] of 2nd Digit 5.7 cm   Other around axilla coming up around shoulder at area of acromion process, 40.4     Right Lower Extremity Lymphedema   20 cm Proximal to Floor at Lateral Plantar Foot 29.'1 1   10 '$ cm Proximal to Floor at Lateral Malleoli 20.9 cm   Circumference of ankle/heel 30.5 cm.   5 cm Proximal to 1st MTP Joint 22.5 cm   Across MTP Joint 22.6 cm   Around Proximal Great Toe 7.8 cm     Left Lower Extremity Lymphedema   20 cm Proximal to Floor at Lateral Plantar Foot 28.9 cm   10 cm Proximal to Floor at Lateral Malleoli 20.7 cm   Circumference of ankle/heel 29.9 cm.   5 cm Proximal to 1st MTP Joint 22.4 cm   Across MTP Joint 22.2 cm   Around Proximal Great Toe 6.9 cm                                Long Term Clinic Goals - 11/23/15 1302      CC Long Term Goal  #1   Title Patient will be independent in self manual lymph drainage.   Time 4   Period Weeks   Status New     CC Long Term Goal  #2   Title Patient will be independent in performance of strength after breast cancer exercise program.   Time 4   Period Weeks    Status New     CC Long Term Goal  #4   Title Patient will be knowledgeable in appropriate compression garments and how to obtain them.   Time 4   Period Weeks   Status New     CC Long Term Goal  #5   Title Patient will be knowledgeable about lymphedema risk reduction practices.   Time 4   Period Weeks   Status New            Plan - 11/23/15 1245    Clinical Impression Statement Patient is a 60 year old female with extensive history of metastatic melanoma with multiple metastases to her brain, lungs, and liver. Patient was treated with immunotherapy and her doctor told her she is currently in clinical remission. She has been experiencing pain at the incision on her right shoulder and swelling in her right UE as a result of removal of right axillary lymph nodes. She also reports swelling and neuropathic pain in both ankles and feet which is a result of her immunotherapy. She states she does not have much swelling today and that the swelling improves with activity.    Rehab Potential Good   Clinical Impairments Affecting Rehab Potential previous cancer and treatments, prolonged steroid use   PT Frequency 2x / week   PT  Duration 4 weeks   PT Treatment/Interventions ADLs/Self Care Home Management;Therapeutic exercise;Therapeutic activities;DME Instruction;Patient/family education;Manual lymph drainage;Manual techniques;Scar mobilization;Passive range of motion;Compression bandaging;Taping   PT Next Visit Plan Instruct patient on self manual lymph drainage, strength after breast cancer   Consulted and Agree with Plan of Care Patient      Patient will benefit from skilled therapeutic intervention in order to improve the following deficits and impairments:  Increased edema, Postural dysfunction, Pain  Visit Diagnosis: Lymphedema, not elsewhere classified  Pain in right shoulder  Pain in left foot  Pain in right foot     Problem List Patient Active Problem List   Diagnosis Date  Noted  . History of pacemaker   . POTS (postural orthostatic tachycardia syndrome)   . Iron deficiency anemia 11/11/2014  . Chronic colitis 08/28/2013  . Malignant melanoma, metastatic ([place]) 02/09/2013  . Malignant melanoma ([place]) 12/01/2012  . Brain metastasis ([place]) 10/16/2012  . Hypertension 09/30/2012  . Anxiety state, unspecified 09/30/2012  . Hypoxemia 09/30/2012  . Dysautonomia 02/25/2011  . Anticoagulant long-term use 01/19/2011  . Neuritis 01/19/2011  . Skin change 01/19/2011  . CVA (cerebral infarction)   . Palpitation 06/28/2010  . Pacemaker-Biotronik 06/28/2010  . Dizziness and giddiness 04/27/2010  . DIVERTICULOSIS, COLON, HX OF 01/19/2009  . CAROTID SINUS SYNDROME 11/17/2008  . OTHER MALAISE AND FATIGUE 11/17/2008  . HYPERTENSION, BENIGN 11/02/2008  . ATRIAL TACHYCARDIA 09/16/2008  . SYNCOPE, HX OF 08/09/2008  . INJURY UNSPEC NERVE SHOULDER GIRDLE&UPPER LIMB 07/12/2008  . PERSONAL HISTORY OF MALIGNANT MELANOMA OF SKIN 07/12/2008  . LUQ PAIN 12/08/2007  . NECK PAIN 09/26/2007  . NUMBNESS, ARM 09/26/2007  . CPK, ABNORMAL 09/26/2007  . ARM PAIN, LEFT 06/25/2007  . SWELLING OF LIMB 06/25/2007  . OSTEOPENIA 06/25/2007    Mellody Life 11/23/2015, 1:04 PM  [place], [place], [address] Phone: [phone]   Fax:  [phone]  Name: [name] MRN: [id number] Date of Birth: June 11, 1955  Saverio Danker, SPT  This entire session was guided, instructed, and directly supervised by Serafina Royals, PT.  Read, reviewed, edited and agree with student's findings and recommendations.   Serafina Royals, PT 11/23/15 8:45 PM

## 2015-11-24 ENCOUNTER — Ambulatory Visit: Payer: Self-pay | Admitting: Radiation Oncology

## 2015-12-05 ENCOUNTER — Ambulatory Visit: Payer: Medicare Other | Attending: Internal Medicine | Admitting: Physical Therapy

## 2015-12-05 DIAGNOSIS — M79672 Pain in left foot: Secondary | ICD-10-CM | POA: Diagnosis not present

## 2015-12-05 DIAGNOSIS — I890 Lymphedema, not elsewhere classified: Secondary | ICD-10-CM | POA: Insufficient documentation

## 2015-12-05 DIAGNOSIS — M25511 Pain in right shoulder: Secondary | ICD-10-CM | POA: Diagnosis not present

## 2015-12-05 DIAGNOSIS — M79671 Pain in right foot: Secondary | ICD-10-CM | POA: Insufficient documentation

## 2015-12-05 NOTE — Therapy (Signed)
[place], [place], [address] Phone: [phone]   Fax:  [phone]  Physical Therapy Treatment  Patient Details  Name: [name] MRN: [id number] Date of Birth: Nov 03, 1955 Referring Provider: [name], MD  Encounter Date: 12/05/2015      PT End of Session - 12/05/15 1338    Visit Number 2   Number of Visits 9   Date for PT Re-Evaluation 12/23/15   PT Start Time 1300   PT Stop Time 1347   PT Time Calculation (min) 47 min   Activity Tolerance Patient tolerated treatment well   Behavior During Therapy [facility] for tasks assessed/performed      Past Medical History:  Diagnosis Date  . Asthma   . Atrial fibrillation ([place])   . Atrial tachycardia ([place])   . Cervicalgia   . Complication of anesthesia    pt states wakes up with "shakes"  . CVA (cerebral infarction)    2012 with dizziness and vision change felt embolic from atrial tachy  . Disorder of bone and cartilage, unspecified   . Disturbance of skin sensation   . Diverticulosis   . DM (diabetes mellitus) ([place])   . DVT (deep venous thrombosis) ([place])   . DVT (deep venous thrombosis) ([place])   . Family history of anesthesia complication    PONV  . History of pacemaker   . History of radiation therapy 10/24/12   brain  . HLD (hyperlipidemia)   . HTN (hypertension)   . Hypoglycemia, unspecified   . 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 Osteopenia   . Other nonspecific abnormal serum enzyme levels   . 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 Pacemaker    autonomic dysfunction  . Pancreatitis    from therapy  . PONV (postoperative nausea and vomiting)   . POTS (postural orthostatic tachycardia syndrome)   . Skin cancer    Hx: of lung lesion  . Stroke [facility])    left weaker   . Swelling of limb     Past Surgical History:  Procedure Laterality Date  . ABLATION SAPHENOUS VEIN  W/ RFA     2002  . CARPAL TUNNEL RELEASE Left   . CRANIOTOMY N/A 09/30/2012   suboccipital craniectomy  . CRANIOTOMY Left 02/09/2013   Procedure: LEFT PARIETAL CRANIOTOMY with stealth;  Surgeon: [name], MD;  Location: [place] [facility];  Service: Neurosurgery;  Laterality: Left;  LEFT Parietal Craniotomy for tumor with stealth  . DEEP AXILLARY SENTINEL NODE BIOPSY / EXCISION     due to extensive Melanoma-right arm  . fatty tumor removed     from chest  . INSERT / REPLACE / REMOVE PACEMAKER  2012   1999, x 3  . LAPAROSCOPY  09/06/2011   Procedure: LAPAROSCOPY OPERATIVE;  Surgeon: Claiborne Billings A. Pamala Hurry, MD;  Location: [place] [facility];  Service: Gynecology;  Laterality: Left;  with Left Ovarian Cystectomy   . MELANOMA EXCISION     with removal of lymph nodes, left shoulder  . NOSE SURGERY  2010, 2012   for nose bleeds x 2  . TONSILLECTOMY AND ADENOIDECTOMY      There were no vitals filed for this visit.      Subjective Assessment - 12/05/15 1302    Subjective Had to have biopsy on back, which turned out to be basal cell carcinoma. They excised this out on 11/24/15. The swelling in her right arm greatly  increased on Saturday and right shoulder got stiff.    Pertinent History Patient with history of Stage IV melanoma of right shoulder with sentinel lymph node biopsy in 2009.  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 Has had 2 brain surgeries. Patient states the cancer is also in her blood.  She has a history of orthostatic intolerance with pacemaker placed and medications to resolve this.   Patient Stated Goals want to get right arm and feet moving more, get swelling in ankles down   Currently in Pain? No/denies                                 PT Education - 12/05/15 1338    Education provided Yes   Education Details discussed types of compression sleeves and where to obtain   Person(s) Educated Patient   Methods Explanation   Comprehension Verbalized understanding                 [place] [facility] Goals - 11/23/15 1302      CC Long Term Goal  #1   Title Patient will be independent in self manual lymph drainage.   Time 4   Period Weeks   Status New     CC Long Term Goal  #2   Title Patient will be independent in performance of strength after breast cancer exercise program.   Time 4   Period Weeks   Status New     CC Long Term Goal  #4   Title Patient will be knowledgeable in appropriate compression garments and how to obtain them.   Time 4   Period Weeks   Status New     CC Long Term Goal  #5   Title Patient will be knowledgeable about lymphedema risk reduction practices.   Time 4   Period Weeks   Status New            Plan - 12/05/15 1339    Clinical Impression Statement Patient reports that the swelling in her right UE has continued to fluctuate. Therapist performed manual lymph drainage to her right UE while educating patient on the technique and anatomy of the lymphatic system. She reports she feels comfortable about this and would be interested in bringing her husband to an appointment so he could learn this.    Rehab Potential Good   Clinical Impairments Affecting Rehab Potential previous cancer and treatments, prolonged steroid use   PT Frequency 2x / week   PT Duration 4 weeks   PT Treatment/Interventions ADLs/Self Care Home Management;Therapeutic exercise;Therapeutic activities;DME Instruction;Patient/family education;Manual lymph drainage;Manual techniques;Scar mobilization;Passive range of motion;Compression bandaging;Taping   PT Next Visit Plan instruct patient on strength after breast cancer program; try heat on feet for neuropathic pain if they are painful   Consulted and Agree with Plan of Care Patient      Patient will benefit from skilled therapeutic intervention in order to improve the following deficits and impairments:  Increased edema, Postural dysfunction, Pain  Visit Diagnosis: Lymphedema, not  elsewhere classified  Right shoulder pain, unspecified chronicity  Pain in left foot  Pain in right foot     Problem List Patient Active Problem List   Diagnosis Date Noted  . History of pacemaker   . POTS (postural orthostatic tachycardia syndrome)   . Iron deficiency anemia 11/11/2014  . Chronic colitis 08/28/2013  . Malignant melanoma, metastatic ([place]) 02/09/2013  . Malignant melanoma (  [place]) 12/01/2012  . Brain metastasis ([place]) 10/16/2012  . Hypertension 09/30/2012  . Anxiety state, unspecified 09/30/2012  . Hypoxemia 09/30/2012  . Dysautonomia 02/25/2011  . Anticoagulant long-term use 01/19/2011  . Neuritis 01/19/2011  . Skin change 01/19/2011  . CVA (cerebral infarction)   . Palpitation 06/28/2010  . Pacemaker-Biotronik 06/28/2010  . Dizziness and giddiness 04/27/2010  . DIVERTICULOSIS, COLON, HX OF 01/19/2009  . CAROTID SINUS SYNDROME 11/17/2008  . OTHER MALAISE AND FATIGUE 11/17/2008  . HYPERTENSION, BENIGN 11/02/2008  . ATRIAL TACHYCARDIA 09/16/2008  . SYNCOPE, HX OF 08/09/2008  . INJURY UNSPEC NERVE SHOULDER GIRDLE&UPPER LIMB 07/12/2008  . PERSONAL HISTORY OF MALIGNANT MELANOMA OF SKIN 07/12/2008  . LUQ PAIN 12/08/2007  . NECK PAIN 09/26/2007  . NUMBNESS, ARM 09/26/2007  . CPK, ABNORMAL 09/26/2007  . ARM PAIN, LEFT 06/25/2007  . SWELLING OF LIMB 06/25/2007  . OSTEOPENIA 06/25/2007    S[place]ISBUR[place],DONNA 12/06/2015, 8:11 AM  [place], [place], [address] Phone: [phone]   Fax:  [phone]  Name: [name] MRN: [id number] Date of Birth: 08-14-1955   Saverio Danker, SPT  This entire session was guided, instructed, and directly supervised by Serafina Royals, PT.  Read, reviewed, edited and agree with student's findings and recommendations.   Serafina Royals, PT 12/06/15 8:11 AM

## 2015-12-07 ENCOUNTER — Ambulatory Visit: Payer: Medicare Other | Admitting: Physical Therapy

## 2015-12-13 ENCOUNTER — Ambulatory Visit: Payer: Medicare Other | Admitting: Physical Therapy

## 2015-12-13 DIAGNOSIS — I890 Lymphedema, not elsewhere classified: Secondary | ICD-10-CM | POA: Diagnosis not present

## 2015-12-13 DIAGNOSIS — M25511 Pain in right shoulder: Secondary | ICD-10-CM

## 2015-12-13 DIAGNOSIS — M79672 Pain in left foot: Secondary | ICD-10-CM

## 2015-12-13 DIAGNOSIS — M79671 Pain in right foot: Secondary | ICD-10-CM | POA: Diagnosis not present

## 2015-12-13 NOTE — Telephone Encounter (Signed)
Please complwete this

## 2015-12-13 NOTE — Therapy (Signed)
[place], [place], [address] Phone: [phone]   Fax:  [phone]  Physical Therapy Treatment  Patient Details  Name: [name] MRN: [id number] Date of Birth: [birth year] Referring Provider: [name], MD  Encounter Date: 12/13/2015      PT End of Session - 12/13/15 1654    Visit Number 3   Number of Visits 9   Date for PT Re-Evaluation 12/23/15   PT Start Time 1600   PT Stop Time 1701   PT Time Calculation (min) 61 min   Activity Tolerance Patient tolerated treatment well   Behavior During Therapy [facility] for tasks assessed/performed      Past Medical History:  Diagnosis Date  . Asthma   . Atrial fibrillation ([place])   . Atrial tachycardia ([place])   . Cervicalgia   . Complication of anesthesia    pt states wakes up with "shakes"  . CVA (cerebral infarction)    2012 with dizziness and vision change felt embolic from atrial tachy  . Disorder of bone and cartilage, unspecified   . Disturbance of skin sensation   . Diverticulosis   . DM (diabetes mellitus) ([place])   . DVT (deep venous thrombosis) ([place])   . DVT (deep venous thrombosis) ([place])   . Family history of anesthesia complication    PONV  . History of pacemaker   . History of radiation therapy 10/24/12   brain  . HLD (hyperlipidemia)   . HTN (hypertension)   . Hypoglycemia, unspecified   . 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 Osteopenia   . Other nonspecific abnormal serum enzyme levels   . 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 Pacemaker    autonomic dysfunction  . Pancreatitis    from therapy  . PONV (postoperative nausea and vomiting)   . POTS (postural orthostatic tachycardia syndrome)   . Skin cancer    Hx: of lung lesion  . Stroke [facility])    left weaker   . Swelling of limb     Past Surgical History:  Procedure Laterality Date  . ABLATION SAPHENOUS VEIN  W/ RFA     2002  . CARPAL TUNNEL RELEASE Left   . CRANIOTOMY N/A 09/30/2012   suboccipital craniectomy  . CRANIOTOMY Left 02/09/2013   Procedure: LEFT PARIETAL CRANIOTOMY with stealth;  Surgeon: [name], MD;  Location: [place] [facility];  Service: Neurosurgery;  Laterality: Left;  LEFT Parietal Craniotomy for tumor with stealth  . DEEP AXILLARY SENTINEL NODE BIOPSY / EXCISION     due to extensive Melanoma-right arm  . fatty tumor removed     from chest  . INSERT / REPLACE / REMOVE PACEMAKER  2012   1999, x 3  . LAPAROSCOPY  09/06/2011   Procedure: LAPAROSCOPY OPERATIVE;  Surgeon: Claiborne Billings A. Pamala Hurry, MD;  Location: [place] [facility];  Service: Gynecology;  Laterality: Left;  with Left Ovarian Cystectomy   . MELANOMA EXCISION     with removal of lymph nodes, left shoulder  . NOSE SURGERY  2010, 2012   for nose bleeds x 2  . TONSILLECTOMY AND ADENOIDECTOMY      There were no vitals filed for this visit.      Subjective Assessment - 12/13/15 1607    Subjective Have really bad allergies right now. Swelling in right arm and feet seems to be better. Neuropathy in feet is still painful, mostly at night.  Patient is accompained by: Family member  husband   Pertinent History Patient with history of Stage IV melanoma of right shoulder with sentinel lymph node biopsy in 2009.  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 Has had 2 brain surgeries. Patient states the cancer is also in her blood.  She has a history of orthostatic intolerance with pacemaker placed and medications to resolve this.   Patient Stated Goals want to get right arm and feet moving more, get swelling in ankles down   Currently in Pain? No/denies                         [facility] Adult PT Treatment/Exercise - 12/13/15 0001      Self-Care   Other Self-Care Comments  instructed pt's husband in performance of manual lymph drainage with therapist performing technique and then patient's husband repeating each step      Manual Therapy   Manual Therapy Other (comment)   Manual therapy comments In supine, short neck, 5 diaphragmatic breaths, left axilla and anterior interaxillary anastomosis, right groin and axillo-inguinal anastomosis, and right UE from fingers to shoulder working proximally to distal and then retracing all steps, all while instructing patient's husband in technique and having him repeat each step   Other Manual Therapy Soft tissue work to left foot and ankle with feet elevated on wedge bolster using Biotone, instructing husband verbally and with demonstration as he observed therapist Serafina Royals, PT, performing this for pain relief in foot (as well as for retrograde massage).                PT Education - 12/13/15 1653    Education provided Yes   Education Details instructed patient's husband in performing manual lymph drainage   Person(s) Educated Patient;Spouse   Methods Explanation;Demonstration;Handout   Comprehension Verbalized understanding;Returned demonstration                [facility] Goals - 11/23/15 1302      CC Long Term Goal  #1   Title Patient will be independent in self manual lymph drainage.   Time 4   Period Weeks   Status New     CC Long Term Goal  #2   Title Patient will be independent in performance of strength after breast cancer exercise program.   Time 4   Period Weeks   Status New     CC Long Term Goal  #4   Title Patient will be knowledgeable in appropriate compression garments and how to obtain them.   Time 4   Period Weeks   Status New     CC Long Term Goal  #5   Title Patient will be knowledgeable about lymphedema risk reduction practices.   Time 4   Period Weeks   Status New            Plan - 12/13/15 1700    Clinical Impression Statement Patient still reports swelling in right UE. She brought her husband today and therapist instructed him in performance of manual lymph drainage with therapist performing  technique and patient's husband repeating each step. Therapist provided him with handout for manual lymph drainage and he feels comfortable with performing this. Therapist also performed soft tissue work to left foot to address neuropathic pain and asked patient to observe and report on any benefit from this, including any benefit of using Biotone cream.   Rehab Potential Good   Clinical Impairments Affecting  Rehab Potential previous cancer and treatments, prolonged steroid use   PT Frequency 2x / week   PT Duration 4 weeks   PT Treatment/Interventions ADLs/Self Care Home Management;Therapeutic exercise;Therapeutic activities;DME Instruction;Patient/family education;Manual lymph drainage;Manual techniques;Scar mobilization;Passive range of motion;Compression bandaging;Taping   PT Next Visit Plan Assess goals; instruct patient on strength after breast cancer program; try heat on feet for neuropathic pain if they are painful; also continue soft tissue work to feet if beneficial.   Consulted and Agree with Plan of Care Patient      Patient will benefit from skilled therapeutic intervention in order to improve the following deficits and impairments:  Increased edema, Postural dysfunction, Pain  Visit Diagnosis: Lymphedema, not elsewhere classified  Right shoulder pain, unspecified chronicity  Pain in left foot  Pain in right foot     Problem List Patient Active Problem List   Diagnosis Date Noted  . History of pacemaker   . POTS (postural orthostatic tachycardia syndrome)   . Iron deficiency anemia 11/11/2014  . Chronic colitis 08/28/2013  . Malignant melanoma, metastatic ([place]) 02/09/2013  . Malignant melanoma ([place]) 12/01/2012  . Brain metastasis ([place]) 10/16/2012  . Hypertension 09/30/2012  . Anxiety state, unspecified 09/30/2012  . Hypoxemia 09/30/2012  . Dysautonomia 02/25/2011  . Anticoagulant long-term use 01/19/2011  . Neuritis 01/19/2011  . Skin change 01/19/2011  . CVA  (cerebral infarction)   . Palpitation 06/28/2010  . Pacemaker-Biotronik 06/28/2010  . Dizziness and giddiness 04/27/2010  . DIVERTICULOSIS, COLON, HX OF 01/19/2009  . CAROTID SINUS SYNDROME 11/17/2008  . OTHER MALAISE AND FATIGUE 11/17/2008  . HYPERTENSION, BENIGN 11/02/2008  . ATRIAL TACHYCARDIA 09/16/2008  . SYNCOPE, HX OF 08/09/2008  . INJURY UNSPEC NERVE SHOULDER GIRDLE&UPPER LIMB 07/12/2008  . PERSONAL HISTORY OF MALIGNANT MELANOMA OF SKIN 07/12/2008  . LUQ PAIN 12/08/2007  . NECK PAIN 09/26/2007  . NUMBNESS, ARM 09/26/2007  . CPK, ABNORMAL 09/26/2007  . ARM PAIN, LEFT 06/25/2007  . SWELLING OF LIMB 06/25/2007  . OSTEOPENIA 06/25/2007    SALISBUR[place],DONNA 12/13/2015, [place], [place], [address] Phone: [phone]   Fax:  [phone]  Name: [name] MRN: [id number] Date of Birth: 1956/02/22  Saverio Danker, SPT  This entire session was guided, instructed, and directly supervised by Serafina Royals, PT.  Read, reviewed, edited and agree with student's findings and recommendations.   Serafina Royals, PT 12/13/15 5:23 PM

## 2015-12-13 NOTE — Patient Instructions (Signed)
Deep Effective Breath   Standing, sitting, or laying down, place both hands on the belly. Take a deep breath IN, expanding the belly; then breath OUT, contracting the belly. Repeat __5__ times. Do __2-3__ sessions per day and before your self massage.  http://gt2.exer.us/866   Copyright  VHI. All rights reserved.  Axilla to Axilla - Sweep   On uninvolved side make 5 circles in the armpit, then pump _5__ times from involved armpit across chest to uninvolved armpit, making a pathway. Do _1__ time per day.  Copyright  VHI. All rights reserved.  Axilla to Inguinal Nodes - Sweep   On involved side, make 5 circles at groin at panty line, then pump _5__ times from armpit along side of trunk to outer hip, making your other pathway. Do __1_ time per day.  Copyright  VHI. All rights reserved.  Arm Posterior: Elbow to Shoulder - Sweep   Pump _5__ times from back of elbow to top of shoulder. Then inner to outer upper arm _5_ times, then outer arm again _5_ times. Then back to the pathways _2-3_ times. Do _1__ time per day.  Copyright  VHI. All rights reserved.  ARM: [place] Wrist to Elbow - Sweep   Pump or stationary circles _5__ times from wrist to elbow making sure to do both sides of the forearm. Then retrace your steps to the outer arm, and the pathways _2-3_ times each. Do _1__ time per day.  Copyright  VHI. All rights reserved.  ARM: Dorsum of Hand to Shoulder - Sweep   Pump or stationary circles _5__ times on back of hand including knuckle spaces and individual fingers if needed working up towards the wrist, then retrace all your steps working back up the forearm, doing both sides; upper outer arm and back to your pathways _2-3_ times each. Then do 5 circles again at uninvolved armpit and involved groin where you started! Good job!! Do __1_ time per day.  Copyright  VHI. All rights reserved.

## 2015-12-14 NOTE — Telephone Encounter (Signed)
This is an old Rx request and the pt has since been given a new one. I will cancel this one.  [name]

## 2015-12-15 DIAGNOSIS — R739 Hyperglycemia, unspecified: Secondary | ICD-10-CM | POA: Diagnosis not present

## 2015-12-15 DIAGNOSIS — Z13220 Encounter for screening for lipoid disorders: Secondary | ICD-10-CM | POA: Diagnosis not present

## 2015-12-15 DIAGNOSIS — C799 Secondary malignant neoplasm of unspecified site: Secondary | ICD-10-CM | POA: Diagnosis not present

## 2015-12-16 ENCOUNTER — Telehealth: Payer: Self-pay | Admitting: Internal Medicine

## 2015-12-16 NOTE — Telephone Encounter (Signed)
Returned patient call.  Patient wanted to make Dr Caryl Comes and Dr Harrington Challenger aware that she has had lipid panel drawn at her [facility].  It was elevated and the oncologist recommended that she contact cardiology.  She would like a call from the Dr Caryl Comes or Harrington Challenger To discuss these results and possible treatment.  Results:  Tot Col= 230,  Trigl.= 197,  HDL= 59, LDL calc= 132.

## 2015-12-16 NOTE — Telephone Encounter (Signed)
New Message  Autumn voiced she is calling for re-certification of the pts [facility].  Please f/u

## 2015-12-16 NOTE — Telephone Encounter (Signed)
Pt has stage 4 melanoma -lipid panel was done-has questions about results and medications   pls call [phone]

## 2015-12-16 NOTE — Telephone Encounter (Signed)
Returned call to [facility] (Autumn).  Left msg that she has an upcoming appt with Dr Harrington Challenger in Nov.  She can fax request for orders etc. And they will be addressed at that time.  Patient hasn't been seen in over a year.

## 2015-12-16 NOTE — Telephone Encounter (Signed)
OK to continue/renew meds

## 2015-12-19 NOTE — Telephone Encounter (Signed)
Spoke with pt and informed her that we were still waiting to hear back from the doctor in regards to labs.  Pt states she is very concerned about lipid panel and is unsure as to what could be done since she is on treatments for cancer and statin had to be stopped.  Pt also mentioned that her LDH is elevated.  Pt would really like to speak to Dr. Harrington Challenger. Advised I will send message to Dr. Harrington Challenger.

## 2015-12-19 NOTE — Telephone Encounter (Signed)
Follow Up:      Please call,pt still have not heard anything since she called on Friday.

## 2015-12-20 NOTE — Telephone Encounter (Signed)
Reviewed labs from [facility] Lipids  Triglycerides high  Watch carbs LDL 132  She is off of statin  Consider Zetia if OK from oncology standpoint   Also should review with her oncologist poss statin use She has mild cholesterol plaquing on CT  Nothing severe

## 2015-12-21 ENCOUNTER — Ambulatory Visit: Payer: Medicare Other

## 2015-12-21 NOTE — Telephone Encounter (Signed)
Left detailed message on patient's self identified voice mail of Dr. Alan Ripper comments below.

## 2015-12-23 ENCOUNTER — Ambulatory Visit: Payer: Medicare Other | Admitting: Physical Therapy

## 2015-12-23 ENCOUNTER — Encounter: Payer: Self-pay | Admitting: Physical Therapy

## 2015-12-23 DIAGNOSIS — I890 Lymphedema, not elsewhere classified: Secondary | ICD-10-CM | POA: Diagnosis not present

## 2015-12-23 DIAGNOSIS — M25511 Pain in right shoulder: Secondary | ICD-10-CM | POA: Diagnosis not present

## 2015-12-23 DIAGNOSIS — M79671 Pain in right foot: Secondary | ICD-10-CM | POA: Diagnosis not present

## 2015-12-23 DIAGNOSIS — M79672 Pain in left foot: Secondary | ICD-10-CM | POA: Diagnosis not present

## 2015-12-23 NOTE — Telephone Encounter (Signed)
Spoke with Nonda Lou, nurse for [facility].  She is aware Dr. Harrington Challenger will continue recert for nurse visit.  Home care will be sending paperwork.

## 2015-12-23 NOTE — Therapy (Addendum)
[place], [place], [address] Phone: [phone]   Fax:  [phone]  Physical Therapy Treatment  Patient Details  Name: [name] MRN: [id number] Date of Birth: October 15, 1955 Referring Provider: [name], MD  Encounter Date: 12/23/2015      PT End of Session - 12/23/15 1203    Visit Number 4   Number of Visits 17   Date for PT Re-Evaluation 01/20/16   PT Start Time 1017   PT Stop Time 1101   PT Time Calculation (min) 44 min   Activity Tolerance Patient tolerated treatment well   Behavior During Therapy [facility] for tasks assessed/performed      Past Medical History:  Diagnosis Date  . Asthma   . Atrial fibrillation ([place])   . Atrial tachycardia ([place])   . Cervicalgia   . Complication of anesthesia    pt states wakes up with "shakes"  . CVA (cerebral infarction)    2012 with dizziness and vision change felt embolic from atrial tachy  . Disorder of bone and cartilage, unspecified   . Disturbance of skin sensation   . Diverticulosis   . DM (diabetes mellitus) ([place])   . DVT (deep venous thrombosis) ([place])   . DVT (deep venous thrombosis) ([place])   . Family history of anesthesia complication    PONV  . History of pacemaker   . History of radiation therapy 10/24/12   brain  . HLD (hyperlipidemia)   . HTN (hypertension)   . Hypoglycemia, unspecified   . 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 Osteopenia   . Other nonspecific abnormal serum enzyme levels   . Other specified congenital anomalies of nervous system   . Pacemaker    autonomic dysfunction  . Pancreatitis    from therapy  . PONV (postoperative nausea and vomiting)   . POTS (postural orthostatic tachycardia syndrome)   . Skin cancer    Hx: of lung lesion  . Stroke [facility])    left weaker   . Swelling of limb     Past Surgical History:  Procedure Laterality Date  . ABLATION SAPHENOUS VEIN W/  RFA     2002  . CARPAL TUNNEL RELEASE Left   . CRANIOTOMY N/A 09/30/2012   suboccipital craniectomy  . CRANIOTOMY Left 02/09/2013   Procedure: LEFT PARIETAL CRANIOTOMY with stealth;  Surgeon: [name], MD;  Location: [place] [facility];  Service: Neurosurgery;  Laterality: Left;  LEFT Parietal Craniotomy for tumor with stealth  . DEEP AXILLARY SENTINEL NODE BIOPSY / EXCISION     due to extensive Melanoma-right arm  . fatty tumor removed     from chest  . INSERT / REPLACE / REMOVE PACEMAKER  2012   1999, x 3  . LAPAROSCOPY  09/06/2011   Procedure: LAPAROSCOPY OPERATIVE;  Surgeon: Claiborne Billings A. Pamala Hurry, MD;  Location: [place] [facility];  Service: Gynecology;  Laterality: Left;  with Left Ovarian Cystectomy   . MELANOMA EXCISION     with removal of lymph nodes, left shoulder  . NOSE SURGERY  2010, 2012   for nose bleeds x 2  . TONSILLECTOMY AND ADENOIDECTOMY      There were no vitals filed for this visit.      Subjective Assessment - 12/23/15 1024    Subjective My feet are still achy. My arm swelling is better right now. I am having arthritis in my hip today and I think since the weather is changing  I am just going to hurt all over.    Pertinent History Patient with history of Stage IV melanoma of right shoulder with sentinel lymph node biopsy in 2009.  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 Has had 2 brain surgeries. Patient states the cancer is also in her blood.  She has a history of orthostatic intolerance with pacemaker placed and medications to resolve this.   Patient Stated Goals want to get right arm and feet moving more, get swelling in ankles down   Currently in Pain? Yes   Pain Score 5    Pain Location Hip   Pain Orientation Right   Pain Descriptors / Indicators Dull;Aching   Pain Type Acute pain   Pain Onset Today   Pain Frequency Intermittent   Aggravating Factors  cold weather   Pain Relieving Factors heat                         [facility] Adult PT  Treatment/Exercise - 12/23/15 0001      Exercises   Exercises Other Exercises  instructed patient in Strength after Breast Cancer Program    Other Exercises  Strength after breast cancer  Pt held stretches for 30 secs bilaterally, 10 reps of exerci                PT Education - 12/23/15 1210    Education provided Yes   Education Details Strength after breast cancer strengthening program, educated pt not to perform calf raises since this causes foot pain, pt held stretches for 30 sec bilaterally, used 2 lb weights for strengthening exercises x 10 reps each except for tricep kickbacks which she used 1 lb for   [employer]) Educated Patient   Methods Explanation;Handout   Comprehension Verbalized understanding                [place] - 12/23/15 1026      CC Long Term Goal  #1   Title Patient will be independent in self manual lymph drainage.   Baseline 12/23/15- pt's husband is independent with this   Time 4   Period Weeks   Status Achieved     CC Long Term Goal  #2   Title Patient will be independent in performance of strength after breast cancer exercise program.   Baseline 12/23/15- pt being instructed in this today   Time 4   Period Weeks   Status On-going     CC Long Term Goal  #4   Title Patient will be knowledgeable in appropriate compression garments and how to obtain them.   Baseline 12/23/15- pt obtained a compression sleeve    Time 4   Period Weeks   Status Achieved     CC Long Term Goal  #5   Title Patient will be knowledgeable about lymphedema risk reduction practices.   Baseline 12/23/15- pt needed cueing for some of these   Time 4   Period Weeks   Status On-going            Plan - 12/23/15 1204    Clinical Impression Statement Assessed pt's goals today. She was educated in strength after breast cancer exercises today and given handout so she can complete these at home. She required verbal cues to independently verbalize  lymphedema risk reduction practices. She states her R UE edema is under control but she is still having foot pain from neuropathy. Patient would benefit from additional skilled PT  visits to progress pt towards independence with her home exercise program and help decrease pain from foot neuropathy.    Clinical Impairments Affecting Rehab Potential previous cancer and treatments, prolonged steroid use   PT Frequency 2x / week   PT Duration 4 weeks   PT Treatment/Interventions ADLs/Self Care Home Management;Therapeutic exercise;Therapeutic activities;DME Instruction;Patient/family education;Manual lymph drainage;Manual techniques;Scar mobilization;Passive range of motion;Compression bandaging;Taping   PT Next Visit Plan assess with indep with strength after breast cancer program; try heat on feet for neuropathic pain if they are painful; also continue soft tissue work to feet if beneficial.   Consulted and Agree with Plan of Care Patient      Patient will benefit from skilled therapeutic intervention in order to improve the following deficits and impairments:  Increased edema, Postural dysfunction, Pain  Visit Diagnosis: Lymphedema, not elsewhere classified - Plan: PT plan of care cert/re-cert  Right shoulder pain, unspecified chronicity - Plan: PT plan of care cert/re-cert     Problem List Patient Active Problem List   Diagnosis Date Noted  . History of pacemaker   . POTS (postural orthostatic tachycardia syndrome)   . Iron deficiency anemia 11/11/2014  . Chronic colitis 08/28/2013  . Malignant melanoma, metastatic ([place]) 02/09/2013  . Malignant melanoma ([place]) 12/01/2012  . Brain metastasis ([place]) 10/16/2012  . Hypertension 09/30/2012  . Anxiety state, unspecified 09/30/2012  . Hypoxemia 09/30/2012  . Dysautonomia 02/25/2011  . Anticoagulant long-term use 01/19/2011  . Neuritis 01/19/2011  . Skin change 01/19/2011  . CVA (cerebral infarction)   . Palpitation 06/28/2010  .  Pacemaker-Biotronik 06/28/2010  . Dizziness and giddiness 04/27/2010  . DIVERTICULOSIS, COLON, HX OF 01/19/2009  . CAROTID SINUS SYNDROME 11/17/2008  . OTHER MALAISE AND FATIGUE 11/17/2008  . HYPERTENSION, BENIGN 11/02/2008  . ATRIAL TACHYCARDIA 09/16/2008  . SYNCOPE, HX OF 08/09/2008  . INJURY UNSPEC NERVE SHOULDER GIRDLE&UPPER LIMB 07/12/2008  . PERSONAL HISTORY OF MALIGNANT MELANOMA OF SKIN 07/12/2008  . LUQ PAIN 12/08/2007  . NECK PAIN 09/26/2007  . NUMBNESS, ARM 09/26/2007  . CPK, ABNORMAL 09/26/2007  . ARM PAIN, LEFT 06/25/2007  . SWELLING OF LIMB 06/25/2007  . OSTEOPENIA 06/25/2007    Alexia Freestone 12/23/2015, 12:11 PM  [place], [place], [address] Phone: [phone]   Fax:  [phone]  Name: KARSTEN VAUGHN MRN: [id number] Date of Birth: 06/23/1955  Allyson Sabal, PT 12/23/15 12:11 PM  PHYSICAL THERAPY DISCHARGE SUMMARY  Visits from Start of Care: 4  Current functional level related to goals / functional outcomes: Goals partially met as noted above.   Remaining deficits: Unknown.  The patient did not return after this visit, although that had been the plan.  Remaining deficits could not be assessed.   Education / Equipment: Home exercise program, lymphedema education. Plan: Patient agrees to discharge.  Patient goals were partially met. Patient is being discharged due to the patient's request.  ?????    Serafina Royals, PT 08/15/16 5:05 PM

## 2015-12-27 ENCOUNTER — Telehealth: Payer: Self-pay | Admitting: Nurse Practitioner

## 2015-12-27 ENCOUNTER — Ambulatory Visit: Payer: Medicare Other

## 2015-12-27 NOTE — Telephone Encounter (Signed)
Received call from Coral Ceo, at [facility] who states their pharmacist advised that Zetia would be reasonable to use and would not cause any interactions with the patient's other medications.  I am forwarding to Dr. Harrington Challenger for Zetia order.

## 2015-12-27 NOTE — Telephone Encounter (Signed)
Called and left message for Coral Ceo, nurse navigator at [facility] to ask if patient can take Zetia for her hyperlipidemia.  I asked Nevin Bloodgood to return my call or to ask for Rodman Key, RN.

## 2015-12-27 NOTE — Telephone Encounter (Signed)
Recomm trial of Zetia 10 mg   F/U lipid panel in 8 wks

## 2015-12-28 MED ORDER — EZETIMIBE 10 MG PO TABS: 10.0000 mg | ORAL_TABLET | Freq: Every day | ORAL | 3 refills | Status: DC

## 2015-12-28 NOTE — Addendum Note (Signed)
Addended by: Rodman Key on: 12/28/2015 03:09 PM   Modules accepted: Orders

## 2015-12-28 NOTE — Telephone Encounter (Signed)
Sent MyChart message to patient to inform Zetia has been ordered and that cholesterol should be checked again 8 weeks after starting the medicine.  Sent 90 day supply to [facility].

## 2016-01-04 ENCOUNTER — Ambulatory Visit: Payer: Medicare Other

## 2016-01-06 ENCOUNTER — Encounter: Payer: Medicare Other | Admitting: Physical Therapy

## 2016-01-10 ENCOUNTER — Other Ambulatory Visit: Payer: Self-pay | Admitting: Internal Medicine

## 2016-01-10 NOTE — Telephone Encounter (Signed)
Okay to refill? 

## 2016-01-11 ENCOUNTER — Other Ambulatory Visit: Payer: Self-pay | Admitting: Internal Medicine

## 2016-01-12 NOTE — Telephone Encounter (Signed)
Yes, please.

## 2016-01-13 ENCOUNTER — Other Ambulatory Visit: Payer: Self-pay | Admitting: Oncology

## 2016-01-13 ENCOUNTER — Encounter: Payer: Medicare Other | Admitting: Physical Therapy

## 2016-01-18 ENCOUNTER — Encounter: Payer: Medicare Other | Admitting: Physical Therapy

## 2016-01-23 ENCOUNTER — Encounter: Payer: Self-pay | Admitting: Internal Medicine

## 2016-01-23 ENCOUNTER — Ambulatory Visit (INDEPENDENT_AMBULATORY_CARE_PROVIDER_SITE_OTHER): Payer: Medicare Other | Admitting: Internal Medicine

## 2016-01-23 VITALS — BP 131/83 | HR 71 | Ht 63.5 in | Wt 157.8 lb

## 2016-01-23 DIAGNOSIS — G90A Postural orthostatic tachycardia syndrome (POTS): Secondary | ICD-10-CM

## 2016-01-23 DIAGNOSIS — I471 Supraventricular tachycardia: Secondary | ICD-10-CM | POA: Diagnosis not present

## 2016-01-23 DIAGNOSIS — R000 Tachycardia, unspecified: Secondary | ICD-10-CM | POA: Diagnosis not present

## 2016-01-23 DIAGNOSIS — I951 Orthostatic hypotension: Secondary | ICD-10-CM

## 2016-01-23 DIAGNOSIS — I498 Other specified cardiac arrhythmias: Secondary | ICD-10-CM

## 2016-01-23 NOTE — Progress Notes (Signed)
Cardiology Office Note   Date:  01/23/2016   ID:  [name], DOB 07-15-1955, MRN [id number]  PCP:  [name], MD  Cardiologist:   Dorris Carnes, MD   F/U of autonomic dysfunction and    History of Present Illness: [name] is a 60 y.o. female with a history of POTS, atrial tachhycardia, CVA, melanoma  She was lasat seen by Olin Pia in cardiology   She is followed closely in Oncology clinic at [place] problems with dizziness  Not as active   Tired    Food intake remains difficult.         Outpatient Medications Prior to Visit  Medication Sig Dispense Refill  . acetaminophen (TYLENOL) 500 MG tablet Take 1,000 mg by mouth every 6 (six) hours as needed for pain.    Marland Kitchen albuterol (VENTOLIN HFA) 108 (90 BASE) MCG/ACT inhaler Inhale 2 puffs into the lungs as needed.    . bimatoprost (LATISSE) 0.03 % ophthalmic solution Apply 1 application topically as needed.     . clonazePAM (KLONOPIN) 0.5 MG tablet Take 1 tablet (0.5 mg total) by mouth 3 (three) times daily as needed for anxiety. 50 tablet 0  . cloNIDine (CATAPRES) 0.1 MG tablet TAKE ONE TABLET BY MOUTH NIGHTLY AT BEDTIME 90 tablet 0  . cyanocobalamin (,VITAMIN B-12,) 1000 MCG/ML injection ADMINISTER 1 ML UNDER THE SKIN 1 TIME A MONTH AS DIRECTED 1 mL 0  . cycloSPORINE (RESTASIS) 0.05 % ophthalmic emulsion daily as needed. allergies    . diphenhydrAMINE (BENADRYL) 25 MG tablet Take 12.5 mg by mouth every 6 (six) hours as needed. Seasonal allergies    . esomeprazole (NEXIUM) 40 MG capsule TAKE ONE CAPSULE BY MOUTH TWICE DAILY BEFORE MEALS 60 capsule 2  . ferrous sulfate 325 (65 FE) MG EC tablet Take 1 tablet (325 mg total) by mouth daily with breakfast.  3  . furosemide (LASIX) 40 MG tablet Take 20 mg by mouth daily. Takes PRN    . hydrocortisone (ANUSOL-HC) 25 MG suppository Place 1 suppository (25 mg total) rectally at bedtime as needed for hemorrhoids. 12 suppository 0  . hydrocortisone 2.5 % cream Apply 1  application topically 2 (two) times daily as needed (irritation). 30 g 0  . levETIRAcetam (KEPPRA) 500 MG tablet Take 500 mg by mouth 2 (two) times daily.     . mesalamine (CANASA) 1000 MG suppository Place 1 suppository (1,000 mg total) rectally at bedtime as needed (hemorrhoids). 30 suppository 0  . metoprolol tartrate (LOPRESSOR) 25 MG tablet TAKE 1 TABLET (25 MG TOTAL) BY MOUTH 2 (TWO) TIMES DAILY. 180 tablet 3  . mometasone (NASONEX) 50 MCG/ACT nasal spray Place 2 sprays into the nose daily.    Marland Kitchen nystatin (MYCOSTATIN) 100000 UNIT/ML suspension Take 5 mLs (500,000 Units total) by mouth 4 (four) times daily. (Patient taking differently: Take 5 mLs by mouth as needed. ) 60 mL 0  . ondansetron (ZOFRAN-ODT) 4 MG disintegrating tablet Take 4 mg by mouth as needed. '4mg'$  ODT q4 hours prn nausea/vomit    . potassium chloride SA (K-DUR,KLOR-CON) 20 MEQ tablet TAKE ONE TABLET BY MOUTH TWICE DAILY 60 tablet 3  . Vitamin D, Ergocalciferol, (DRISDOL) 50000 units CAPS capsule TAKE 1 CAPSULE (50,000 UNITS TOTAL) BY MOUTH EVERY 7 (SEVEN) DAYS. 4 capsule 1  . cyanocobalamin (,VITAMIN B-12,) 1000 MCG/ML injection ADMINISTER 1 ML UNDER THE SKIN 1 TIME A MONTH AS DIRECTED (Patient not taking: Reported on 01/23/2016) 1 mL 0  . ezetimibe (  ZETIA) 10 MG tablet Take 1 tablet (10 mg total) by mouth daily. (Patient not taking: Reported on 01/23/2016) 90 tablet 3   No facility-administered medications prior to visit.      Allergies:   Doxycycline; Erythromycin; Other; Penicillins; Sulfa antibiotics; Preparation h [pramox-pe-glycerin-petrolatum]; Promethazine; Sulfonamide derivatives; and Phenergan [promethazine hcl]   Past Medical History:  Diagnosis Date  . Asthma   . Atrial fibrillation ([place])   . Atrial tachycardia ([place])   . Cervicalgia   . Complication of anesthesia    pt states wakes up with "shakes"  . CVA (cerebral infarction)    2012 with dizziness and vision change felt embolic from atrial tachy  .  Disorder of bone and cartilage, unspecified   . Disturbance of skin sensation   . Diverticulosis   . DM (diabetes mellitus) ([place])   . DVT (deep venous thrombosis) ([place])   . DVT (deep venous thrombosis) ([place])   . Family history of anesthesia complication    PONV  . History of pacemaker   . History of radiation therapy 10/24/12   brain  . HLD (hyperlipidemia)   . HTN (hypertension)   . Hypoglycemia, unspecified   . 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 Osteopenia   . Other nonspecific abnormal serum enzyme levels   . Other specified congenital anomalies of nervous system   . Pacemaker    autonomic dysfunction  . Pancreatitis    from therapy  . PONV (postoperative nausea and vomiting)   . POTS (postural orthostatic tachycardia syndrome)   . Skin cancer    Hx: of lung lesion  . Stroke [facility])    left weaker   . Swelling of limb     Past Surgical History:  Procedure Laterality Date  . ABLATION SAPHENOUS VEIN W/ RFA     2002  . CARPAL TUNNEL RELEASE Left   . CRANIOTOMY N/A 09/30/2012   suboccipital craniectomy  . CRANIOTOMY Left 02/09/2013   Procedure: LEFT PARIETAL CRANIOTOMY with stealth;  Surgeon: [name], MD;  Location: [facility];  Service: Neurosurgery;  Laterality: Left;  LEFT Parietal Craniotomy for tumor with stealth  . DEEP AXILLARY SENTINEL NODE BIOPSY / EXCISION     due to extensive Melanoma-right arm  . fatty tumor removed     from chest  . INSERT / REPLACE / REMOVE PACEMAKER  2012   1999, x 3  . LAPAROSCOPY  09/06/2011   Procedure: LAPAROSCOPY OPERATIVE;  Surgeon: Claiborne Billings A. Pamala Hurry, MD;  Location: [facility];  Service: Gynecology;  Laterality: Left;  with Left Ovarian Cystectomy   . MELANOMA EXCISION     with removal of lymph nodes, left shoulder  . NOSE SURGERY  2010, 2012   for nose bleeds x 2  . TONSILLECTOMY AND ADENOIDECTOMY       Social History:  The patient  reports that she has never smoked. She has never used smokeless  tobacco. She reports that she does not drink alcohol or use drugs.   Family History:  The patient's family history includes Aneurysm in her sister; Clotting disorder in her maternal grandmother; Colon polyps in her mother; Crohn's disease in her maternal grandmother; Diabetes in her father, maternal grandmother, and paternal grandmother; Heart disease in her father; Kidney disease in her father; Stroke in her mother.    ROS:  Please see the history of present illness. All other systems are reviewed and  Negative to the above problem except as noted.  PHYSICAL EXAM: VS:  BP 131/83   Pulse 71   Ht 5' 3.5" (1.613 m)   Wt 157 lb 12.8 oz (71.6 kg)   BMI 27.51 kg/m   GEN: Well nourished, well developed, in no acute distress  HEENT: normal  Neck: no JVD, carotid bruits, or masses Cardiac: RRR; no murmurs, rubs, or gallops,no edema  Respiratory:  clear to auscultation bilaterally, normal work of breathing GI: soft, nontender, nondistended, + BS  No hepatomegaly  MS: no deformity Moving all extremities   Skin: warm and dry, no rash Neuro:  Strength and sensation are intact Psych: euthymic mood, full affect   EKG:  EKG is not ordered today.   Lipid Panel    Component Value Date/Time   CHOL 145 02/09/2014 1419   TRIG 123.0 02/09/2014 1419   HDL 47.90 02/09/2014 1419   CHOLHDL 3 02/09/2014 1419   VLDL 24.6 02/09/2014 1419   LDLCALC 73 02/09/2014 1419   LDLDIRECT 109.7 10/11/2010 1222      Wt Readings from Last 3 Encounters:  01/23/16 157 lb 12.8 oz (71.6 kg)  11/21/15 153 lb 12.8 oz (69.8 kg)  07/29/15 152 lb 6.4 oz (69.1 kg)      ASSESSMENT AND PLAN:  1  POTS  Keep on same meds   2  Atrial tach  Followed by Olin Pia with PPM  3  HL    Will see about get Zetia approved  Now it is not covered  But, per oncology, pt should not be on statin      Current medicines are reviewed at length with the patient today.  The patient does not have concerns regarding  medicines.  Signed, Dorris Carnes, MD  01/23/2016 1:59 PM    [facility] [place], [place], [place]  [address] Phone: [phone]; Fax: [phone]

## 2016-01-23 NOTE — Patient Instructions (Signed)
Your physician recommends that you continue on your current medications as directed. Please refer to the Current Medication list given to you today. Your physician wants you to follow-up in: 1 year with Dr. Ross.  You will receive a reminder letter in the mail two months in advance. If you don't receive a letter, please call our office to schedule the follow-up appointment.  

## 2016-01-27 ENCOUNTER — Encounter: Payer: Medicare Other | Admitting: Physical Therapy

## 2016-01-31 DIAGNOSIS — J3089 Other allergic rhinitis: Secondary | ICD-10-CM | POA: Diagnosis not present

## 2016-01-31 DIAGNOSIS — I669 Occlusion and stenosis of unspecified cerebral artery: Secondary | ICD-10-CM | POA: Diagnosis not present

## 2016-01-31 DIAGNOSIS — I10 Essential (primary) hypertension: Secondary | ICD-10-CM | POA: Diagnosis not present

## 2016-01-31 DIAGNOSIS — G40909 Epilepsy, unspecified, not intractable, without status epilepticus: Secondary | ICD-10-CM | POA: Diagnosis not present

## 2016-01-31 DIAGNOSIS — E119 Type 2 diabetes mellitus without complications: Secondary | ICD-10-CM | POA: Diagnosis not present

## 2016-01-31 DIAGNOSIS — F419 Anxiety disorder, unspecified: Secondary | ICD-10-CM | POA: Diagnosis not present

## 2016-01-31 DIAGNOSIS — E1140 Type 2 diabetes mellitus with diabetic neuropathy, unspecified: Secondary | ICD-10-CM | POA: Diagnosis not present

## 2016-01-31 DIAGNOSIS — Z8719 Personal history of other diseases of the digestive system: Secondary | ICD-10-CM | POA: Diagnosis not present

## 2016-01-31 DIAGNOSIS — Z79899 Other long term (current) drug therapy: Secondary | ICD-10-CM | POA: Diagnosis not present

## 2016-01-31 DIAGNOSIS — I4891 Unspecified atrial fibrillation: Secondary | ICD-10-CM | POA: Diagnosis not present

## 2016-01-31 DIAGNOSIS — C439 Malignant melanoma of skin, unspecified: Secondary | ICD-10-CM | POA: Diagnosis not present

## 2016-01-31 DIAGNOSIS — Z950 Presence of cardiac pacemaker: Secondary | ICD-10-CM | POA: Diagnosis not present

## 2016-01-31 DIAGNOSIS — C7931 Secondary malignant neoplasm of brain: Secondary | ICD-10-CM | POA: Diagnosis not present

## 2016-01-31 DIAGNOSIS — G9001 Carotid sinus syncope: Secondary | ICD-10-CM | POA: Diagnosis not present

## 2016-01-31 DIAGNOSIS — Z800 Family history of malignant neoplasm of digestive organs: Secondary | ICD-10-CM | POA: Diagnosis not present

## 2016-01-31 DIAGNOSIS — Z6827 Body mass index (BMI) 27.0-27.9, adult: Secondary | ICD-10-CM | POA: Diagnosis not present

## 2016-01-31 DIAGNOSIS — Z7984 Long term (current) use of oral hypoglycemic drugs: Secondary | ICD-10-CM | POA: Diagnosis not present

## 2016-02-13 ENCOUNTER — Encounter: Payer: Self-pay | Admitting: Internal Medicine

## 2016-02-13 ENCOUNTER — Other Ambulatory Visit: Payer: Self-pay | Admitting: Internal Medicine

## 2016-02-14 ENCOUNTER — Other Ambulatory Visit: Payer: Self-pay | Admitting: *Deleted

## 2016-02-14 MED ORDER — CYANOCOBALAMIN 1000 MCG/ML IJ SOLN: 1000.0000 ug | INTRAMUSCULAR | 11 refills | Status: DC

## 2016-02-14 MED ORDER — VITAMIN D (ERGOCALCIFEROL) 1.25 MG (50000 UNIT) PO CAPS: 50000.0000 [IU] | ORAL_CAPSULE | ORAL | 11 refills | Status: DC

## 2016-02-14 NOTE — Telephone Encounter (Signed)
Refilled medication

## 2016-02-19 DIAGNOSIS — C719 Malignant neoplasm of brain, unspecified: Secondary | ICD-10-CM | POA: Diagnosis not present

## 2016-02-24 ENCOUNTER — Telehealth: Payer: Self-pay | Admitting: Internal Medicine

## 2016-02-24 ENCOUNTER — Other Ambulatory Visit: Payer: Self-pay | Admitting: Radiation Therapy

## 2016-02-24 DIAGNOSIS — C7931 Secondary malignant neoplasm of brain: Secondary | ICD-10-CM

## 2016-02-24 DIAGNOSIS — C7949 Secondary malignant neoplasm of other parts of nervous system: Principal | ICD-10-CM

## 2016-02-24 NOTE — Telephone Encounter (Signed)
This patient needs to have an MRI observed by Dr, Caryl Comes due to her having a pacemaker-needed to speak to nurse

## 2016-02-24 NOTE — Telephone Encounter (Signed)
LM on confidential oncology VM that Dr. Caryl Comes will be back in office 02/29/16.  I believe the MRI needs to be rescheduled with Dr. Caryl Comes and a [employer] representative. I left a return number to device clinic but will route the message to Alvis Lemmings, RN.

## 2016-02-28 ENCOUNTER — Ambulatory Visit (HOSPITAL_COMMUNITY)
Admission: RE | Admit: 2016-02-28 | Discharge: 2016-02-28 | Disposition: A | Discharge status: Home / Self Care | Payer: Medicare Other | Source: Ambulatory Visit | Attending: Radiation Oncology | Admitting: Radiation Oncology

## 2016-02-28 ENCOUNTER — Other Ambulatory Visit: Payer: Self-pay | Admitting: Radiation Oncology

## 2016-02-28 DIAGNOSIS — C439 Malignant melanoma of skin, unspecified: Secondary | ICD-10-CM | POA: Diagnosis not present

## 2016-02-28 DIAGNOSIS — C7949 Secondary malignant neoplasm of other parts of nervous system: Secondary | ICD-10-CM | POA: Insufficient documentation

## 2016-02-28 DIAGNOSIS — M545 Low back pain: Secondary | ICD-10-CM | POA: Diagnosis not present

## 2016-02-28 DIAGNOSIS — C7931 Secondary malignant neoplasm of brain: Secondary | ICD-10-CM

## 2016-02-28 MED ORDER — GADOBENATE DIMEGLUMINE 529 MG/ML IV SOLN
15.0000 mL | Freq: Once | INTRAVENOUS | Status: AC
  Administered 2016-02-28: 14 mL via INTRAVENOUS

## 2016-02-29 NOTE — Telephone Encounter (Signed)
Per Dr. Caryl Comes- MRI done 02/28/16.

## 2016-03-01 ENCOUNTER — Telehealth: Payer: Self-pay | Admitting: Internal Medicine

## 2016-03-01 NOTE — Telephone Encounter (Signed)
New Message  [name] voiced she was calling to speak with our nurse in regards to this mutual patient.  Please f/u

## 2016-03-01 NOTE — Telephone Encounter (Signed)
Spoke with Nonda Lou, patient's home care nurse. Pt has been discharged from home care due to her re certification visit being completed too soon per [place].     Pt needs a new home care order to restart home care services.    Will route to Dr. Harrington Challenger for review/advisement.

## 2016-03-05 ENCOUNTER — Telehealth: Payer: Self-pay | Admitting: Radiation Oncology

## 2016-03-05 NOTE — Telephone Encounter (Signed)
I called the patient and left a message to let her know the discussion from brain conference agreed that her films were stable and without any new concerns or changes.

## 2016-03-16 ENCOUNTER — Telehealth: Payer: Self-pay | Admitting: Cardiology

## 2016-03-16 NOTE — Telephone Encounter (Signed)
Received page from [place] at [facility], who was asked by patient to contact on call cardiology provider for orders for IV fluids and B12 injections.  I advised [name] that if the pt feels that she has an urgent medical issue, she should seek treatment in the emergency department.  I also encouraged pt to follow up with her outpatient providers on Monday.

## 2016-03-16 NOTE — Telephone Encounter (Signed)
Spoke with [name]   Pt has not received IV fluids in a long time Getting B12 injections monthly  SInce she is not home bound these cannot be justified through insurance for home health.   WIll contact pt.

## 2016-03-19 ENCOUNTER — Encounter: Payer: Self-pay | Admitting: Internal Medicine

## 2016-03-19 ENCOUNTER — Telehealth: Payer: Self-pay | Admitting: Internal Medicine

## 2016-03-19 DIAGNOSIS — H16223 Keratoconjunctivitis sicca, not specified as Sjogren's, bilateral: Secondary | ICD-10-CM | POA: Diagnosis not present

## 2016-03-19 NOTE — Telephone Encounter (Signed)
I have received communication from Dr Benay Spice. B12 levels have not been low. He recomm following levels.  B12 can be supplemented orally if ever low. This can be done through primary care or heme/onc [facility])

## 2016-03-19 NOTE — Telephone Encounter (Signed)
New Message   Pt states she's coming to office to get B12 Injection. Wants a call back today.

## 2016-03-19 NOTE — Telephone Encounter (Signed)
Follow Up:   Please call today.Pt says she needs to talk to you today,pt can not come tomorrow due to a medical crisis.Marland Kitchen

## 2016-03-19 NOTE — Telephone Encounter (Signed)
Called patient who states she needs to have her B12 injection and her home care orders have been discontinued.   Stated she was due to have injection this past Sunday.  She requests a home care order for someone to come to her home and teach her and her husband to give the injection.     I advised that Dr. Harrington Challenger has emailed her PCP and her hematology doctor.   Pt is reluctant to go to doctors office for injection because of her immunosuppression.  Will review with Dr. Harrington Challenger and the clinic supervisor for further recommendations.  Advised patient of this.

## 2016-03-21 NOTE — Telephone Encounter (Signed)
Follow Up  Pt calling to follow up on situation she discussed with [facility]. Received call from Martin, and needs to share some information. Requesting call back.

## 2016-03-22 ENCOUNTER — Telehealth: Payer: Self-pay | Admitting: Internal Medicine

## 2016-03-22 NOTE — Telephone Encounter (Signed)
New Message     Returning your call about complicated matter

## 2016-03-22 NOTE — Telephone Encounter (Signed)
New Message:    Needs a verbal order to a do one time teaching visit please.It is for B-12 injection and will not be billing Medicare.f

## 2016-03-22 NOTE — Telephone Encounter (Signed)
[place]

## 2016-03-22 NOTE — Telephone Encounter (Signed)
New Message   Pt calling again to speak with [facility]... Per pt needing to follow up on some things. Requesting call back

## 2016-03-23 ENCOUNTER — Encounter: Payer: Self-pay | Admitting: Internal Medicine

## 2016-03-23 ENCOUNTER — Encounter: Payer: Self-pay | Admitting: *Deleted

## 2016-03-23 DIAGNOSIS — H16223 Keratoconjunctivitis sicca, not specified as Sjogren's, bilateral: Secondary | ICD-10-CM | POA: Diagnosis not present

## 2016-03-23 NOTE — Telephone Encounter (Signed)
Spoke with the patient for over and hour.  Dr Caryl Comes it going to call to [facility] to address her fluid issue as it relates to POTS and her cancer as she is imunocomprimised.  The B12 issue has been resolved. [name], RN clinical nurse manager called and had them do a transition of care follow up.   She is aware that after Dr Caryl Comes calls and comes up with plan for fluids(if and when needed) I will be in touch with her.

## 2016-03-27 ENCOUNTER — Encounter: Payer: Self-pay | Admitting: Internal Medicine

## 2016-03-27 ENCOUNTER — Telehealth: Payer: Self-pay | Admitting: Internal Medicine

## 2016-03-27 ENCOUNTER — Telehealth: Payer: Self-pay | Admitting: *Deleted

## 2016-03-27 ENCOUNTER — Ambulatory Visit (INDEPENDENT_AMBULATORY_CARE_PROVIDER_SITE_OTHER): Payer: Medicare Other | Admitting: Internal Medicine

## 2016-03-27 ENCOUNTER — Telehealth: Payer: Self-pay

## 2016-03-27 VITALS — BP 120/92 | HR 72 | Temp 97.7°F | Wt 159.4 lb

## 2016-03-27 DIAGNOSIS — D849 Immunodeficiency, unspecified: Secondary | ICD-10-CM

## 2016-03-27 DIAGNOSIS — C799 Secondary malignant neoplasm of unspecified site: Secondary | ICD-10-CM | POA: Diagnosis not present

## 2016-03-27 DIAGNOSIS — I951 Orthostatic hypotension: Secondary | ICD-10-CM

## 2016-03-27 DIAGNOSIS — R6889 Other general symptoms and signs: Secondary | ICD-10-CM

## 2016-03-27 DIAGNOSIS — D899 Disorder involving the immune mechanism, unspecified: Secondary | ICD-10-CM | POA: Diagnosis not present

## 2016-03-27 DIAGNOSIS — R52 Pain, unspecified: Secondary | ICD-10-CM | POA: Diagnosis not present

## 2016-03-27 DIAGNOSIS — R000 Tachycardia, unspecified: Secondary | ICD-10-CM

## 2016-03-27 DIAGNOSIS — I498 Other specified cardiac arrhythmias: Secondary | ICD-10-CM

## 2016-03-27 DIAGNOSIS — C439 Malignant melanoma of skin, unspecified: Secondary | ICD-10-CM

## 2016-03-27 DIAGNOSIS — G90A Postural orthostatic tachycardia syndrome (POTS): Secondary | ICD-10-CM

## 2016-03-27 LAB — POCT INFLUENZA A: Rapid Influenza A Ag: NEGATIVE

## 2016-03-27 MED ORDER — OSELTAMIVIR PHOSPHATE 75 MG PO CAPS: 75.0000 mg | ORAL_CAPSULE | Freq: Two times a day (BID) | ORAL | 0 refills | Status: DC

## 2016-03-27 MED ORDER — ALBUTEROL SULFATE HFA 108 (90 BASE) MCG/ACT IN AERS: 2.0000 | INHALATION_SPRAY | RESPIRATORY_TRACT | 1 refills | Status: DC | PRN

## 2016-03-27 NOTE — Patient Instructions (Addendum)
Even though the rapid flu test is negative (doesn't pick up all flu )and because this acts like a flulike illness I believe that medicine is more benefit than risk for you. If you're fever isn't improvement in the next 24 - 48 hours    or you're getting worse over the next few days although your cough will continue Contact us and we can put an order in for a chest x-ray to make sure there is not pneumonia coming cannot hear  stethoscopes. Okay to use the albuterol as needed your pulse ox is okay today.    Influenza, Adult Influenza, more commonly known as "the flu," is a viral infection that primarily affects the respiratory tract. The respiratory tract includes organs that help you breathe, such as the lungs, nose, and throat. The flu causes many common cold symptoms, as well as a high fever and body aches. The flu spreads easily from person to person (is contagious). Getting a flu shot (influenza vaccination) every year is the best way to prevent influenza. What are the causes? Influenza is caused by a virus. You can catch the virus by:  Breathing in droplets from an infected person's cough or sneeze.  Touching something that was recently contaminated with the virus and then touching your mouth, nose, or eyes. What increases the risk? The following factors may make you more likely to get the flu:  Not cleaning your hands frequently with soap and water or alcohol-based hand sanitizer.  Having close contact with many people during cold and flu season.  Touching your mouth, eyes, or nose without washing or sanitizing your hands first.  Not drinking enough fluids or not eating a healthy diet.  Not getting enough sleep or exercise.  Being under a high amount of stress.  Not getting a yearly (annual) flu shot. You may be at a higher risk of complications from the flu, such as a severe lung infection (pneumonia), if you:  Are over the age of 71.  Are pregnant.  Have a weakened  disease-fighting system (immune system). You may have a weakened immune system if you:  Have HIV or AIDS.  Are undergoing chemotherapy.  Aretaking medicines that reduce the activity of (suppress) the immune system.  Have a long-term (chronic) illness, such as heart disease, kidney disease, diabetes, or lung disease.  Have a liver disorder.  Are obese.  Have anemia. What are the signs or symptoms? Symptoms of this condition typically last 4-10 days and may include:  Fever.  Chills.  Headache, body aches, or muscle aches.  Sore throat.  Cough.  Runny or congested nose.  Chest discomfort and cough.  Poor appetite.  Weakness or tiredness (fatigue).  Dizziness.  Nausea or vomiting. How is this diagnosed? This condition may be diagnosed based on your medical history and a physical exam. Your health care provider may do a nose or throat swab test to confirm the diagnosis. How is this treated? If influenza is detected early, you can be treated with antiviral medicine that can reduce the length of your illness and the severity of your symptoms. This medicine may be given by mouth (orally) or through an IV tube that is inserted in one of your veins. The goal of treatment is to relieve symptoms by taking care of yourself at home. This may include taking over-the-counter medicines, drinking plenty of fluids, and adding humidity to the air in your home. In some cases, influenza goes away on its own. Severe influenza or complications from  influenza may be treated in a hospital. Follow these instructions at home:  Take over-the-counter and prescription medicines only as told by your health care provider.  Use a cool mist humidifier to add humidity to the air in your home. This can make breathing easier.  Rest as needed.  Drink enough fluid to keep your urine clear or pale yellow.  Cover your mouth and nose when you cough or sneeze.  Wash your hands with soap and water  often, especially after you cough or sneeze. If soap and water are not available, use hand sanitizer.  Stay home from work or school as told by your health care provider. Unless you are visiting your health care provider, try to avoid leaving home until your fever has been gone for 24 hours without the use of medicine.  Keep all follow-up visits as told by your health care provider. This is important. How is this prevented?  Getting an annual flu shot is the best way to avoid getting the flu. You may get the flu shot in late summer, fall, or winter. Ask your health care provider when you should get your flu shot.  Wash your hands often or use hand sanitizer often.  Avoid contact with people who are sick during cold and flu season.  Eat a healthy diet, drink plenty of fluids, get enough sleep, and exercise regularly. Contact a health care provider if:  You develop new symptoms.  You have:  Chest pain.  Diarrhea.  A fever.  Your cough gets worse.  You produce more mucus.  You feel nauseous or you vomit. Get help right away if:  You develop shortness of breath or difficulty breathing.  Your skin or nails turn a bluish color.  You have severe pain or stiffness in your neck.  You develop a sudden headache or sudden pain in your face or ear.  You cannot stop vomiting. This information is not intended to replace advice given to you by your health care provider. Make sure you discuss any questions you have with your health care provider. Document Released: 02/10/2000 Document Revised: 07/21/2015 Document Reviewed: 12/07/2014 Elsevier Interactive Patient Education  2017 [employer].

## 2016-03-27 NOTE — Telephone Encounter (Signed)
Pt reports sudden onset of flu-like symptoms, fever last night up to 102, sore throat and earache. Pt denies n/v/d. Pt would like to know if ok for her to have Tamiflu. She does not want to be seen in office as she is immunosuppressed due to cancer.   Dr. Regis Bill - Please advise. Thanks!

## 2016-03-27 NOTE — Telephone Encounter (Signed)
Late entry: Message from pt reporting flu-like symptoms. Requesting Tamiflu. Noted pt also contacted PCP and was evaluated in office.  Dr. Benay Spice made aware.

## 2016-03-27 NOTE — Progress Notes (Signed)
Pre visit review using our clinic review tool, if applicable. No additional management support is needed unless otherwise documented below in the visit note. 

## 2016-03-27 NOTE — Progress Notes (Signed)
Chief Complaint  Patient presents with  . Sore Throat    fevrer, cough, shortness of breathing,wheezing    HPI: [name] 61 y.o.  Here with husband  w  Acute onset of sx .yesterday afternoon with fever chills sore throat and achiness  And then cough  Fever last was 101.5 rx tylenol and comes down  Has some wheezy feeling in chest ( hx of same with her  Cancer immunotherapy) some hoarse   takin fluids ok .  Avoiding public health care settings    Here with husband today  No flu had runny nose and drainage December etc .   ROS: See pertinent positives and negatives per HPI. No rash pain around left ear  No other acute sx   Would like a refill albuterol m  Past Medical History:  Diagnosis Date  . Asthma   . Atrial fibrillation ([place])   . Atrial tachycardia ([place])   . Cervicalgia   . Complication of anesthesia    pt states wakes up with "shakes"  . CVA (cerebral infarction)    2012 with dizziness and vision change felt embolic from atrial tachy  . Disorder of bone and cartilage, unspecified   . Disturbance of skin sensation   . Diverticulosis   . DM (diabetes mellitus) ([place])   . DVT (deep venous thrombosis) ([place])   . DVT (deep venous thrombosis) ([place])   . Family history of anesthesia complication    PONV  . History of pacemaker   . History of radiation therapy 10/24/12   brain  . HLD (hyperlipidemia)   . HTN (hypertension)   . Hypoglycemia, unspecified   . 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 Osteopenia   . Other nonspecific abnormal serum enzyme levels   . Other specified congenital anomalies of nervous system   . Pacemaker    autonomic dysfunction  . Pancreatitis    from therapy  . PONV (postoperative nausea and vomiting)   . POTS (postural orthostatic tachycardia syndrome)   . Skin cancer    Hx: of lung lesion  . Stroke [facility])    left weaker   . Swelling of limb     Family History  Problem Relation Age of Onset  . Stroke  Mother   . Colon polyps Mother   . Heart disease Father   . Diabetes Father   . Kidney disease Father   . Diabetes Maternal Grandmother   . Clotting disorder Maternal Grandmother     stroke  . Crohn's disease Maternal Grandmother   . Diabetes Paternal Grandmother   . Aneurysm Sister     brain    Social History   Social History  . Marital status: Married    Spouse name: N/A  . Number of children: 0  . Years of education: N/A   Occupational History  . [place]    self employed   Social History Main Topics  . Smoking status: Never Smoker  . Smokeless tobacco: Never Used  . Alcohol use No     Comment: glass of wine daily  . Drug use: No  . Sexual activity: Not Asked   Other Topics Concern  . None   Social History Narrative   4 years college hh of 2    Neg tad    Outpatient Medications Prior to Visit  Medication Sig Dispense Refill  . acetaminophen (TYLENOL) 500 MG tablet Take 1,000 mg by mouth  every 6 (six) hours as needed for pain.    . clonazePAM (KLONOPIN) 0.5 MG tablet Take 1 tablet (0.5 mg total) by mouth 3 (three) times daily as needed for anxiety. 50 tablet 0  . cloNIDine (CATAPRES) 0.1 MG tablet TAKE ONE TABLET BY MOUTH NIGHTLY AT BEDTIME 90 tablet 0  . cyanocobalamin (,VITAMIN B-12,) 1000 MCG/ML injection Inject 1 mL (1,000 mcg total) into the skin every 30 (thirty) days. 1 mL 11  . diphenhydrAMINE (BENADRYL) 25 MG tablet Take 12.5 mg by mouth every 6 (six) hours as needed. Seasonal allergies    . esomeprazole (NEXIUM) 40 MG capsule TAKE ONE CAPSULE BY MOUTH TWICE DAILY BEFORE MEALS 60 capsule 2  . ferrous sulfate 325 (65 FE) MG EC tablet Take 1 tablet (325 mg total) by mouth daily with breakfast.  3  . furosemide (LASIX) 40 MG tablet Take 20 mg by mouth daily. Takes PRN    . hydrocortisone (ANUSOL-HC) 25 MG suppository Place 1 suppository (25 mg total) rectally at bedtime as needed for hemorrhoids. 12 suppository 0  . hydrocortisone 2.5  % cream Apply 1 application topically 2 (two) times daily as needed (irritation). 30 g 0  . levETIRAcetam (KEPPRA) 500 MG tablet Take 500 mg by mouth 2 (two) times daily.     . mesalamine (CANASA) 1000 MG suppository Place 1 suppository (1,000 mg total) rectally at bedtime as needed (hemorrhoids). 30 suppository 0  . metoprolol tartrate (LOPRESSOR) 25 MG tablet TAKE 1 TABLET (25 MG TOTAL) BY MOUTH 2 (TWO) TIMES DAILY. 180 tablet 3  . nystatin (MYCOSTATIN) 100000 UNIT/ML suspension Take 5 mLs (500,000 Units total) by mouth 4 (four) times daily. (Patient taking differently: Take 5 mLs by mouth as needed. ) 60 mL 0  . ondansetron (ZOFRAN-ODT) 4 MG disintegrating tablet Take 4 mg by mouth as needed. '4mg'$  ODT q4 hours prn nausea/vomit    . potassium chloride SA (K-DUR,KLOR-CON) 20 MEQ tablet TAKE ONE TABLET BY MOUTH TWICE DAILY 60 tablet 3  . Vitamin D, Ergocalciferol, (DRISDOL) 50000 units CAPS capsule Take 1 capsule (50,000 Units total) by mouth every 7 (seven) days. 4 capsule 11  . albuterol (VENTOLIN HFA) 108 (90 BASE) MCG/ACT inhaler Inhale 2 puffs into the lungs as needed.    . bimatoprost (LATISSE) 0.03 % ophthalmic solution Apply 1 application topically as needed.     . cycloSPORINE (RESTASIS) 0.05 % ophthalmic emulsion daily as needed. allergies    . mometasone (NASONEX) 50 MCG/ACT nasal spray Place 2 sprays into the nose daily.     No facility-administered medications prior to visit.      EXAM:  BP (!) 120/92 (BP Location: Left Arm, Patient Position: Sitting, Cuff Size: Normal)   Pulse 72   Temp 97.7 F (36.5 C) (Oral)   Wt 159 lb 6.4 oz (72.3 kg)   SpO2 96%   BMI 27.79 kg/m   Body mass index is 27.79 kg/m. WDWN in NAD  quiet respirations; mildly congested  somewhat hoarse. Non toxic . HEENT: Normocephalic ;atraumatic , Eyes;  PERRL, EOMs  Full, lids and conjunctiva clear,,Ears: no deformities, canals nl, TM landmarks normal, Nose: no deformity or discharge but congested;face non   tender Mouth : OP clear without lesion or edema . Neck: Supple without adenopathy or masses or bruits Chest:  Clear to A&P without wheezes rales or rhonchi CV:  S1-S2 no gallops or murmurs peripheral perfusion is normal Skin :nl perfusion and no acute rashes    ASSESSMENT AND PLAN:  Discussed  the following assessment and plan:  Flu-like symptoms  Body aches - Plan: POCT Influenza A  Malignant melanoma, metastatic ([place]) - in remission   Immunosuppressed status ([place]) - reported on  [name]  for her melanoma in remission  POTS (postural orthostatic tachycardia syndrome)  Risk benefit of medication discussed. At this time her exam is reassuring. It is very early in the illness. Low risk to get a chest x-ray we discussed where to get this that she could contact us and we'll put the order in for readings for imaging. She also mentioned the fact that the home health agencies will no longer be able to help her with IV infusions and B12 because she's not of initially home bound by strict criteria. However it would be risky for her to go to infusion center because of her risk of infection exposure. Consider asking oncologist if they have had luck with considering immunosuppression home bound or special case situation such as hers. -Patient advised to return or notify health care team  if symptoms worsen ,persist or new concerns arise.  Patient Instructions  Even though the rapid flu test is negative (doesn't pick up all flu )and because this acts like a flulike illness I believe that medicine is more benefit than risk for you. If you're fever isn't improvement in the next 24 - 48 hours    or you're getting worse over the next few days although your cough will continue Contact us and we can put an order in for a chest x-ray to make sure there is not pneumonia coming cannot hear  stethoscopes. Okay to use the albuterol as needed your pulse ox is okay today.    Influenza, Adult Influenza, more  commonly known as "the flu," is a viral infection that primarily affects the respiratory tract. The respiratory tract includes organs that help you breathe, such as the lungs, nose, and throat. The flu causes many common cold symptoms, as well as a high fever and body aches. The flu spreads easily from person to person (is contagious). Getting a flu shot (influenza vaccination) every year is the best way to prevent influenza. What are the causes? Influenza is caused by a virus. You can catch the virus by:  Breathing in droplets from an infected person's cough or sneeze.  Touching something that was recently contaminated with the virus and then touching your mouth, nose, or eyes. What increases the risk? The following factors may make you more likely to get the flu:  Not cleaning your hands frequently with soap and water or alcohol-based hand sanitizer.  Having close contact with many people during cold and flu season.  Touching your mouth, eyes, or nose without washing or sanitizing your hands first.  Not drinking enough fluids or not eating a healthy diet.  Not getting enough sleep or exercise.  Being under a high amount of stress.  Not getting a yearly (annual) flu shot. You may be at a higher risk of complications from the flu, such as a severe lung infection (pneumonia), if you:  Are over the age of 37.  Are pregnant.  Have a weakened disease-fighting system (immune system). You may have a weakened immune system if you:  Have HIV or AIDS.  Are undergoing chemotherapy.  Aretaking medicines that reduce the activity of (suppress) the immune system.  Have a long-term (chronic) illness, such as heart disease, kidney disease, diabetes, or lung disease.  Have a liver disorder.  Are obese.  Have anemia. What are  the signs or symptoms? Symptoms of this condition typically last 4-10 days and may include:  Fever.  Chills.  Headache, body aches, or muscle aches.  Sore  throat.  Cough.  Runny or congested nose.  Chest discomfort and cough.  Poor appetite.  Weakness or tiredness (fatigue).  Dizziness.  Nausea or vomiting. How is this diagnosed? This condition may be diagnosed based on your medical history and a physical exam. Your health care provider may do a nose or throat swab test to confirm the diagnosis. How is this treated? If influenza is detected early, you can be treated with antiviral medicine that can reduce the length of your illness and the severity of your symptoms. This medicine may be given by mouth (orally) or through an IV tube that is inserted in one of your veins. The goal of treatment is to relieve symptoms by taking care of yourself at home. This may include taking over-the-counter medicines, drinking plenty of fluids, and adding humidity to the air in your home. In some cases, influenza goes away on its own. Severe influenza or complications from influenza may be treated in a hospital. Follow these instructions at home:  Take over-the-counter and prescription medicines only as told by your health care provider.  Use a cool mist humidifier to add humidity to the air in your home. This can make breathing easier.  Rest as needed.  Drink enough fluid to keep your urine clear or pale yellow.  Cover your mouth and nose when you cough or sneeze.  Wash your hands with soap and water often, especially after you cough or sneeze. If soap and water are not available, use hand sanitizer.  Stay home from work or school as told by your health care provider. Unless you are visiting your health care provider, try to avoid leaving home until your fever has been gone for 24 hours without the use of medicine.  Keep all follow-up visits as told by your health care provider. This is important. How is this prevented?  Getting an annual flu shot is the best way to avoid getting the flu. You may get the flu shot in late summer, fall, or winter.  Ask your health care provider when you should get your flu shot.  Wash your hands often or use hand sanitizer often.  Avoid contact with people who are sick during cold and flu season.  Eat a healthy diet, drink plenty of fluids, get enough sleep, and exercise regularly. Contact a health care provider if:  You develop new symptoms.  You have:  Chest pain.  Diarrhea.  A fever.  Your cough gets worse.  You produce more mucus.  You feel nauseous or you vomit. Get help right away if:  You develop shortness of breath or difficulty breathing.  Your skin or nails turn a bluish color.  You have severe pain or stiffness in your neck.  You develop a sudden headache or sudden pain in your face or ear.  You cannot stop vomiting. This information is not intended to replace advice given to you by your health care provider. Make sure you discuss any questions you have with your health care provider. Document Released: 02/10/2000 Document Revised: 07/21/2015 Document Reviewed: 12/07/2014 Elsevier Interactive Patient Education  2017 [place] K. Weslee Fogg M.D.

## 2016-03-27 NOTE — Telephone Encounter (Signed)
Nurse from cancer center just called and we made her an appt for 4pm today.  The nurse stated she would contact pt to let her know of appt as nurse stated that the pt is stable and completely able to come in to our office to be seen.  Advised her that pt has to call to cx appt if she is not wanting to keep it.  She also advised that the pt is wanting to know if she can get an Rx for Tamiflu.  I advised that typically the office does not call in a Rx for tamiflu unless the pt is seen.  The nurse also advised that the oncologist would be calling Dr. Regis Bill to fill her in on the patient's treatment and Plan of care.

## 2016-03-27 NOTE — Telephone Encounter (Signed)
Pt has been exposed to the flu, started running a fever last night, (102) bad sore throat, cough, ear ache. This came on very quickly. Pt has been taking tylenol which is helping with the fever. Pt states with her stage 4 cancer, she is limited on what she can take.  Would like to know if she can get the tami flu Pt states she has a nurse navigator in [place], [place] Ceo [phone], who has told her she has tried to call our office.  They would like all to be in agreement if OK for pt to have the tami flu. Pt states she will need to get this med prior to 3 pm today to make it into the 24 hr window advised by tami flu guidelines.  [facility]

## 2016-03-27 NOTE — Telephone Encounter (Signed)
She may be a candidate for tamiflu but still needs to be seen to evaluate cause of fever .  Please  Help her come  With mask and escort her to a room to avoid  [facility] waiting room  .   We may do a flu screen on her

## 2016-03-27 NOTE — Telephone Encounter (Signed)
Noted! Thank you

## 2016-03-29 ENCOUNTER — Encounter: Payer: Self-pay | Admitting: Internal Medicine

## 2016-03-29 DIAGNOSIS — R059 Cough, unspecified: Secondary | ICD-10-CM

## 2016-03-29 DIAGNOSIS — R05 Cough: Secondary | ICD-10-CM

## 2016-03-29 NOTE — Telephone Encounter (Signed)
Pt does not want to go to [place] [facility] xray dept due to a lot of sick people. Pt would like to go else where

## 2016-03-29 NOTE — Telephone Encounter (Signed)
Discussed with Dr Caryl Comes and he has spoken with Dr Benay Spice.  He is going to call patient and discuss.

## 2016-03-30 ENCOUNTER — Ambulatory Visit (INDEPENDENT_AMBULATORY_CARE_PROVIDER_SITE_OTHER): Payer: Medicare Other

## 2016-03-30 ENCOUNTER — Ambulatory Visit: Payer: Medicare Other

## 2016-03-30 DIAGNOSIS — R05 Cough: Secondary | ICD-10-CM | POA: Diagnosis not present

## 2016-03-30 DIAGNOSIS — R0602 Shortness of breath: Secondary | ICD-10-CM | POA: Diagnosis not present

## 2016-03-30 DIAGNOSIS — R059 Cough, unspecified: Secondary | ICD-10-CM

## 2016-04-02 ENCOUNTER — Other Ambulatory Visit: Payer: Self-pay | Admitting: Internal Medicine

## 2016-04-02 NOTE — Telephone Encounter (Signed)
Pt already seen in office.

## 2016-04-02 NOTE — Telephone Encounter (Signed)
New Message  Pt voiced wanting nurse to give her a call and also Janan Halter to give her a call also.  Please f/u

## 2016-04-03 NOTE — Telephone Encounter (Signed)
new message   pt verbalized that she is calling for rn to get home health started

## 2016-04-03 NOTE — Telephone Encounter (Signed)
[facility] spoke with Dr Caryl Comes in regards to this patient and he plans to call the pt.

## 2016-04-20 ENCOUNTER — Telehealth: Payer: Self-pay | Admitting: Internal Medicine

## 2016-04-20 NOTE — Telephone Encounter (Signed)
Order received from Dr. Caryl Comes to please fax an order to [place] [facility] to "infuse 1 liter 0.9% NS as needed for hypotension."  Called placed to [place] [phone] to obtain fax #.   Faxed order to [phone]- confirmation received.

## 2016-04-23 ENCOUNTER — Telehealth: Payer: Self-pay | Admitting: Internal Medicine

## 2016-04-23 NOTE — Telephone Encounter (Signed)
New Message:   [name]  received a prescription on Friday for IV fluids. Pt is no longer with their services. She was discharged on 02-19-16.

## 2016-04-23 NOTE — Telephone Encounter (Signed)
Reviewed with Dr. Caryl Comes- he states he received a call from [facility] [place] on Friday and the fluid order was received- they will treat this patient as a self pay.

## 2016-04-24 ENCOUNTER — Encounter: Payer: Self-pay | Admitting: Internal Medicine

## 2016-04-24 ENCOUNTER — Ambulatory Visit (INDEPENDENT_AMBULATORY_CARE_PROVIDER_SITE_OTHER): Payer: Medicare Other | Admitting: Internal Medicine

## 2016-04-24 VITALS — BP 108/80 | HR 90 | Ht 63.0 in | Wt 152.8 lb

## 2016-04-24 DIAGNOSIS — G909 Disorder of the autonomic nervous system, unspecified: Secondary | ICD-10-CM

## 2016-04-24 DIAGNOSIS — I471 Supraventricular tachycardia: Secondary | ICD-10-CM | POA: Diagnosis not present

## 2016-04-24 DIAGNOSIS — Z950 Presence of cardiac pacemaker: Secondary | ICD-10-CM

## 2016-04-24 DIAGNOSIS — G901 Familial dysautonomia [Riley-Day]: Secondary | ICD-10-CM

## 2016-04-24 LAB — CUP PACEART INCLINIC DEVICE CHECK
Date Time Interrogation Session: 20180227151250
Implantable Lead Implant Date: 19990303
Implantable Lead Implant Date: 19990303
Implantable Lead Location: 753859
Implantable Lead Location: 753860
Implantable Lead Model: 5092
Implantable Pulse Generator Implant Date: 20120905
Lead Channel Setting Pacing Amplitude: 2 V
Lead Channel Setting Pacing Amplitude: 4 V
Lead Channel Setting Pacing Pulse Width: 1 ms
Pulse Gen Serial Number: 66195584

## 2016-04-24 MED ORDER — CLONIDINE HCL 0.1 MG PO TABS: 0.1000 mg | ORAL_TABLET | Freq: Every day | ORAL | 3 refills | Status: DC

## 2016-04-24 MED ORDER — CYANOCOBALAMIN 1000 MCG/ML IJ SOLN: 1000.0000 ug | INTRAMUSCULAR | 11 refills | Status: DC

## 2016-04-24 MED ORDER — METOPROLOL TARTRATE 25 MG PO TABS: 25.0000 mg | ORAL_TABLET | Freq: Two times a day (BID) | ORAL | 3 refills | Status: DC

## 2016-04-24 MED ORDER — "SYRINGE 27G X 1-1/4"" 3 ML MISC": 1.0000 mL | 0 refills | Status: DC

## 2016-04-24 NOTE — Patient Instructions (Addendum)
Medication Instructions: - Your physician recommends that you continue on your current medications as directed. Please refer to the Current Medication list given to you today.  Labwork: - none ordered  Procedures/Testing: - none ordered  Follow-Up: - Your physician recommends that you schedule a follow-up appointment in: 4 months with Dr. Caryl Comes.   Any Additional Special Instructions Will Be Listed Below (If Applicable).     If you need a refill on your cardiac medications before your next appointment, please call your pharmacy.

## 2016-04-24 NOTE — Progress Notes (Signed)
Patient Care Team: Burnis Medin, MD as PCP - General [name], MD as Consulting Physician (Neurosurgery) [name], MD as Consulting Physician (Oncology) [name], MD as Referring Physician (Internal Medicine)   HPI  [name] is a 61 y.o. female Seen for device interrogation following cranial radiation recently identified metastatic melanoma.  Her biggest issues today relates to the handling of the loss of her home health care support. She brings in documentation that she received from [facility]  office regarding the appropriate procedure for discontinuation of care.    She had an viral illness over the weekend; she is working on oral rehydration.  She has a history of a pacemaker for sinus node dysfunction in the setting of dysautonomia  Past Medical History:  Diagnosis Date  . Asthma   . Atrial fibrillation ([place])   . Atrial tachycardia ([place])   . Cervicalgia   . Complication of anesthesia    pt states wakes up with "shakes"  . CVA (cerebral infarction)    2012 with dizziness and vision change felt embolic from atrial tachy  . Disorder of bone and cartilage, unspecified   . Disturbance of skin sensation   . Diverticulosis   . DM (diabetes mellitus) ([place])   . DVT (deep venous thrombosis) ([place])   . DVT (deep venous thrombosis) ([place])   . Family history of anesthesia complication    PONV  . History of pacemaker   . History of radiation therapy 10/24/12   brain  . HLD (hyperlipidemia)   . HTN (hypertension)   . Hypoglycemia, unspecified   . 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 Osteopenia   . Other nonspecific abnormal serum enzyme levels   . Other specified congenital anomalies of nervous system   . Pacemaker    autonomic dysfunction  . Pancreatitis    from therapy  . PONV (postoperative nausea and vomiting)   . POTS (postural orthostatic tachycardia syndrome)   . Skin cancer    Hx: of lung lesion  .  Stroke [facility])    left weaker   . Swelling of limb     Past Surgical History:  Procedure Laterality Date  . ABLATION SAPHENOUS VEIN W/ RFA     2002  . CARPAL TUNNEL RELEASE Left   . CRANIOTOMY N/A 09/30/2012   suboccipital craniectomy  . CRANIOTOMY Left 02/09/2013   Procedure: LEFT PARIETAL CRANIOTOMY with stealth;  Surgeon: [name], MD;  Location: [facility];  Service: Neurosurgery;  Laterality: Left;  LEFT Parietal Craniotomy for tumor with stealth  . DEEP AXILLARY SENTINEL NODE BIOPSY / EXCISION     due to extensive Melanoma-right arm  . fatty tumor removed     from chest  . INSERT / REPLACE / REMOVE PACEMAKER  2012   1999, x 3  . LAPAROSCOPY  09/06/2011   Procedure: LAPAROSCOPY OPERATIVE;  Surgeon: Claiborne Billings A. Pamala Hurry, MD;  Location: [facility];  Service: Gynecology;  Laterality: Left;  with Left Ovarian Cystectomy   . MELANOMA EXCISION     with removal of lymph nodes, left shoulder  . NOSE SURGERY  2010, 2012   for nose bleeds x 2  . TONSILLECTOMY AND ADENOIDECTOMY      Current Outpatient Prescriptions  Medication Sig Dispense Refill  . acetaminophen (TYLENOL) 500 MG tablet Take 1,000 mg by mouth every 6 (six) hours as needed for pain.    Marland Kitchen albuterol (VENTOLIN HFA) 108 (90 Base) MCG/ACT inhaler  Inhale 2 puffs into the lungs as needed. 1 Inhaler 1  . bimatoprost (LATISSE) 0.03 % ophthalmic solution Apply 1 application topically as needed.     . clonazePAM (KLONOPIN) 0.5 MG tablet Take 1 tablet (0.5 mg total) by mouth 3 (three) times daily as needed for anxiety. 50 tablet 0  . cloNIDine (CATAPRES) 0.1 MG tablet TAKE ONE TABLET BY MOUTH NIGHTLY AT BEDTIME 90 tablet 1  . cyanocobalamin (,VITAMIN B-12,) 1000 MCG/ML injection Inject 1 mL (1,000 mcg total) into the skin every 30 (thirty) days. 1 mL 11  . cycloSPORINE (RESTASIS) 0.05 % ophthalmic emulsion daily as needed. allergies    . diphenhydrAMINE (BENADRYL) 25 MG tablet Take 12.5 mg by mouth every 6 (six) hours as needed.  Seasonal allergies    . esomeprazole (NEXIUM) 40 MG capsule TAKE ONE CAPSULE BY MOUTH TWICE DAILY BEFORE MEALS 60 capsule 2  . ferrous sulfate 325 (65 FE) MG EC tablet Take 1 tablet (325 mg total) by mouth daily with breakfast.  3  . furosemide (LASIX) 40 MG tablet Take 20 mg by mouth daily. Takes PRN    . hydrocortisone (ANUSOL-HC) 25 MG suppository Place 1 suppository (25 mg total) rectally at bedtime as needed for hemorrhoids. 12 suppository 0  . hydrocortisone 2.5 % cream Apply 1 application topically 2 (two) times daily as needed (irritation). 30 g 0  . levETIRAcetam (KEPPRA) 500 MG tablet Take 500 mg by mouth 2 (two) times daily.     . mesalamine (CANASA) 1000 MG suppository Place 1 suppository (1,000 mg total) rectally at bedtime as needed (hemorrhoids). 30 suppository 0  . metoprolol tartrate (LOPRESSOR) 25 MG tablet TAKE 1 TABLET (25 MG TOTAL) BY MOUTH 2 (TWO) TIMES DAILY. 180 tablet 3  . mometasone (NASONEX) 50 MCG/ACT nasal spray Place 2 sprays into the nose daily.    Marland Kitchen nystatin (MYCOSTATIN) 100000 UNIT/ML suspension Take 5 mLs (500,000 Units total) by mouth 4 (four) times daily. (Patient taking differently: Take 5 mLs by mouth as needed. ) 60 mL 0  . ondansetron (ZOFRAN-ODT) 4 MG disintegrating tablet Take 4 mg by mouth as needed. '4mg'$  ODT q4 hours prn nausea/vomit    . potassium chloride SA (K-DUR,KLOR-CON) 20 MEQ tablet TAKE ONE TABLET BY MOUTH TWICE DAILY 60 tablet 3  . Vitamin D, Ergocalciferol, (DRISDOL) 50000 units CAPS capsule Take 1 capsule (50,000 Units total) by mouth every 7 (seven) days. 4 capsule 11   No current facility-administered medications for this visit.     Allergies  Allergen Reactions  . Doxycycline Hives  . Erythromycin Hives  . Other Hives    Cannot use  Cream or Suppositories  . Penicillins Hives  . Sulfa Antibiotics Hives  . Preparation H [Pramox-Pe-Glycerin-Petrolatum] Hives    Cannot use  Cream or Suppositories   . Promethazine     Other  reaction(s): Other (See Comments) Causes restless leg syndrome Other reaction(s): Other (See Comments) Causes restless leg syndrome  . Sulfonamide Derivatives Hives  . Phenergan [Promethazine Hcl] Other (See Comments)    Causes restless leg syndrome    Review of Systems negative except from HPI and PMH  Physical Exam BP 108/80   Pulse 90   Ht '5\' 3"'$  (1.6 m)   Wt 152 lb 12.8 oz (69.3 kg)   SpO2 96%   BMI 27.07 kg/m  Device pocket well healed; without hematoma or erythema.  There is no tethering  RRR Clear No edema   Assessment and  Plan  Atrial  Tach  Pacemaker Biotronik  Normal function   Dysautonomia   Sinus node dysfunction    Stable rhythm  We discussed the issues related to her home health care. We will refill her vitamin B-12 for the direction of Dr. Raquel Sarna at the [facility] in [place]. We have written for her to get IV fluids on an as-needed basis. I have been in touch with the administration the office to have them assist with accomplishing the procedural discontinuation of care under Dr. Harrington Challenger  We spent more than 50% of our >25 min visit in face to face counseling regarding the above

## 2016-04-25 ENCOUNTER — Telehealth: Payer: Self-pay | Admitting: Internal Medicine

## 2016-04-25 NOTE — Telephone Encounter (Signed)
[name] from [facility] is calling back to inform Dr. Caryl Comes that they are not going to be able to IV hydration prn as private pay for the patient as they had previously discussed. She is requesting that Dr. Caryl Comes give her a call at [phone]. [phone] to discuss further. Message routed to Dr. Caryl Comes.

## 2016-04-25 NOTE — Telephone Encounter (Signed)
Please call [name],concerning order Dr Caryl Comes wrote yesterday.

## 2016-04-26 NOTE — Telephone Encounter (Signed)
Called and spoke with  [name]   Apparently issues are still unresolved  She will let us know about further decisions

## 2016-04-26 NOTE — Telephone Encounter (Signed)
Called to get details from [name] of the next steps required for Dr. Harrington Challenger' cessation of care documentation and [name] had left for the day.  Merry Proud confirmed that I could reach her tomorrow after 8:30am. I will try again at that time.

## 2016-04-27 NOTE — Telephone Encounter (Signed)
Called [facility] to speak with [name] about the cessation of care order and she was not in the office yet.   I left a message with Suzy Bouchard at [facility] requesting that [name] call me on my cell phone when she gets in this morning.

## 2016-04-27 NOTE — Telephone Encounter (Signed)
Per [name], [facility] no longer needs cessation of care orders from Dr. Harrington Challenger.   [name] confirmed that [facility] is setting the patient up for private pay care.   I will review the additional order needs with Dr. Caryl Comes when he returns to the office next Tuesday, 05/01/16.

## 2016-04-30 ENCOUNTER — Telehealth: Payer: Self-pay | Admitting: Internal Medicine

## 2016-04-30 NOTE — Telephone Encounter (Signed)
Called and left message for [employer] with [facility] to call back. Left messate that Dr. Caryl Comes is out of the office today. Will send message to Dr. Caryl Comes and his nurse for orders and advisement.

## 2016-04-30 NOTE — Telephone Encounter (Signed)
New Message     Pt has prn hydration IV order, can the nurse start the IV and the pt will DC the IV ? They need this in a order

## 2016-05-01 NOTE — Telephone Encounter (Signed)
Follow Up   Per [name] needing clarification for or to start IV hydration. Requesting call back

## 2016-05-01 NOTE — Telephone Encounter (Signed)
Called and left message for [employer] with [facility] to call back. Left message that Dr. Caryl Comes is out of the office today. Will send message to Dr. Caryl Comes and his nurse for orders and advisement.

## 2016-05-02 ENCOUNTER — Telehealth: Payer: Self-pay | Admitting: Internal Medicine

## 2016-05-02 NOTE — Telephone Encounter (Signed)
I left a message for [name] that I faxed a revised fluid order on 04/24/16 for the patient:  Infuse 1 liter of 0.9% NS as needed for 1) dehydration secondary to diarrhea/ vomiting, 2) hypotension (SBP < 100) 3) fevers  Faxed to [facility] [phone]- confirmation received.

## 2016-05-02 NOTE — Telephone Encounter (Signed)
Call received from [place] at [facility]- she states they went out today to start an infusion on the patient of 0.9 % NS, but per the patient, she does not get infused with "the one with the salt."  I inquired if the patient knew what she was getting and per [name], it has been a year since they gave her anything and she was not sure- she asked if they could give her Lactated Ringers. I advised [name] I will need to clarify with Dr. Caryl Comes what he wants her to have aside from NS.  Per [name], they are going back out on Friday to infuse after she has her scan done.

## 2016-05-03 DIAGNOSIS — F419 Anxiety disorder, unspecified: Secondary | ICD-10-CM | POA: Diagnosis not present

## 2016-05-03 DIAGNOSIS — C7931 Secondary malignant neoplasm of brain: Secondary | ICD-10-CM | POA: Diagnosis not present

## 2016-05-03 DIAGNOSIS — Z79899 Other long term (current) drug therapy: Secondary | ICD-10-CM | POA: Diagnosis not present

## 2016-05-03 DIAGNOSIS — Z800 Family history of malignant neoplasm of digestive organs: Secondary | ICD-10-CM | POA: Diagnosis not present

## 2016-05-03 DIAGNOSIS — G40909 Epilepsy, unspecified, not intractable, without status epilepticus: Secondary | ICD-10-CM | POA: Diagnosis not present

## 2016-05-03 DIAGNOSIS — Z8719 Personal history of other diseases of the digestive system: Secondary | ICD-10-CM | POA: Diagnosis not present

## 2016-05-03 DIAGNOSIS — Z08 Encounter for follow-up examination after completed treatment for malignant neoplasm: Secondary | ICD-10-CM | POA: Diagnosis not present

## 2016-05-03 DIAGNOSIS — I669 Occlusion and stenosis of unspecified cerebral artery: Secondary | ICD-10-CM | POA: Diagnosis not present

## 2016-05-03 DIAGNOSIS — Z950 Presence of cardiac pacemaker: Secondary | ICD-10-CM | POA: Diagnosis not present

## 2016-05-03 DIAGNOSIS — M549 Dorsalgia, unspecified: Secondary | ICD-10-CM | POA: Diagnosis not present

## 2016-05-03 DIAGNOSIS — E119 Type 2 diabetes mellitus without complications: Secondary | ICD-10-CM | POA: Diagnosis not present

## 2016-05-03 DIAGNOSIS — Z85841 Personal history of malignant neoplasm of brain: Secondary | ICD-10-CM | POA: Diagnosis not present

## 2016-05-03 DIAGNOSIS — J309 Allergic rhinitis, unspecified: Secondary | ICD-10-CM | POA: Diagnosis not present

## 2016-05-03 DIAGNOSIS — Z85118 Personal history of other malignant neoplasm of bronchus and lung: Secondary | ICD-10-CM | POA: Diagnosis not present

## 2016-05-03 DIAGNOSIS — I10 Essential (primary) hypertension: Secondary | ICD-10-CM | POA: Diagnosis not present

## 2016-05-03 DIAGNOSIS — Z85820 Personal history of malignant melanoma of skin: Secondary | ICD-10-CM | POA: Diagnosis not present

## 2016-05-03 DIAGNOSIS — G9001 Carotid sinus syncope: Secondary | ICD-10-CM | POA: Diagnosis not present

## 2016-05-03 DIAGNOSIS — Z6827 Body mass index (BMI) 27.0-27.9, adult: Secondary | ICD-10-CM | POA: Diagnosis not present

## 2016-05-03 DIAGNOSIS — Z7984 Long term (current) use of oral hypoglycemic drugs: Secondary | ICD-10-CM | POA: Diagnosis not present

## 2016-05-03 DIAGNOSIS — K5730 Diverticulosis of large intestine without perforation or abscess without bleeding: Secondary | ICD-10-CM | POA: Diagnosis not present

## 2016-05-03 DIAGNOSIS — R918 Other nonspecific abnormal finding of lung field: Secondary | ICD-10-CM | POA: Diagnosis not present

## 2016-05-03 DIAGNOSIS — C439 Malignant melanoma of skin, unspecified: Secondary | ICD-10-CM | POA: Diagnosis not present

## 2016-05-03 DIAGNOSIS — I4891 Unspecified atrial fibrillation: Secondary | ICD-10-CM | POA: Diagnosis not present

## 2016-05-03 DIAGNOSIS — F411 Generalized anxiety disorder: Secondary | ICD-10-CM | POA: Diagnosis not present

## 2016-05-03 DIAGNOSIS — C787 Secondary malignant neoplasm of liver and intrahepatic bile duct: Secondary | ICD-10-CM | POA: Diagnosis not present

## 2016-05-03 DIAGNOSIS — J3089 Other allergic rhinitis: Secondary | ICD-10-CM | POA: Diagnosis not present

## 2016-05-03 DIAGNOSIS — G9389 Other specified disorders of brain: Secondary | ICD-10-CM | POA: Diagnosis not present

## 2016-05-03 NOTE — Telephone Encounter (Signed)
I left a message for [name] at [facility] that the patient will need to take 0.9% NS for her hydration. Dr. Caryl Comes has reviewed this with the patient.

## 2016-05-03 NOTE — Telephone Encounter (Signed)
New Message  Heather from [facility] called to confirm if pts will need normal Saline or Lactated ringers for infusion. Please call back to discuss

## 2016-05-04 ENCOUNTER — Telehealth: Payer: Self-pay | Admitting: Internal Medicine

## 2016-05-04 NOTE — Telephone Encounter (Signed)
Kerrin, Freight forwarder at [employer] is calling on behalf of patient would like to know if we can change fluid orders from normal saline to lactated ringers. Please call to discuss at [phone]. Thanks.

## 2016-05-04 NOTE — Telephone Encounter (Signed)
I spoke with Kerrin at [facility] and advised her I had left a message for [name] that the patient needs to have 0.9% NS infused.

## 2016-05-07 ENCOUNTER — Telehealth: Payer: Self-pay | Admitting: Internal Medicine

## 2016-05-07 NOTE — Telephone Encounter (Signed)
New Message    Pt needs to speak with a nurse about her home health care services.

## 2016-05-07 NOTE — Telephone Encounter (Signed)
Pt calling to give a message to [facility] and Dr Caryl Comes, on return to the office tomorrow.  Pt states that its in regards to [facility] not complying with her home health orders over the weekend.  Pt would not disclose any further information, for she states that Dr Caryl Comes and Nira Conn RN will know exactly what she's talking about, and she wants them to call her tomorrow to go more in depth about this issue.  Informed the pt that I will route this message to both Dr Caryl Comes and Nira Conn for further review and follow-up with the pt, upon return to the office tomorrow.  Pt verbalized understanding and agrees with this plan.  Pt gracious for all the assistance provided.

## 2016-05-10 ENCOUNTER — Other Ambulatory Visit: Payer: Self-pay | Admitting: Internal Medicine

## 2016-05-10 DIAGNOSIS — G901 Familial dysautonomia [Riley-Day]: Secondary | ICD-10-CM

## 2016-05-10 DIAGNOSIS — N83201 Unspecified ovarian cyst, right side: Secondary | ICD-10-CM | POA: Diagnosis not present

## 2016-05-10 NOTE — Telephone Encounter (Signed)
Late entry- spoke with Dr. Caryl Comes on 05/08/16 regarding this message- he stated he would call the patient.  I spoke with him today to confirm- he states that the patient had been in touch with our director, Everlean Patterson.  She has not been able to get in touch with [facility] to arrange for IV fluid infusion for hydration.  She is now wanting an order to go to Short Stay per Dr. Caryl Comes- order placed.  I called and scheduled the patient for tomorrow per Dr. Caryl Comes and Everlean Patterson- [phone]. She has an appointment at 9:00 am tomorrow for infusion.  Per Glenard Haring, she will notify the patient.

## 2016-05-11 ENCOUNTER — Telehealth: Payer: Self-pay | Admitting: Internal Medicine

## 2016-05-11 ENCOUNTER — Encounter (HOSPITAL_COMMUNITY): Payer: Medicare Other

## 2016-05-11 NOTE — Telephone Encounter (Signed)
Follow Up:   Please e-mail her the copy of the order that Dr Caryl Comes sent to [place] please. Please e-mail it to [email or web address].

## 2016-05-11 NOTE — Telephone Encounter (Signed)
Patient was arranged for IV hydration in Short Stay for today at [place]. She could not come in at this time due to another appointment. I spoke with Short Stay to reschedule. Per Short Stay, the patient may call them directly to r/s.  I have notified the patient of this.  Contact # for Short Stay given- [phone].

## 2016-05-11 NOTE — Telephone Encounter (Signed)
I spoke with [name] in Medical Records about releasing the order to the patient. I gave the original order to [name] to contact the patient about how to obtain.  She will contact the patient.

## 2016-05-16 ENCOUNTER — Telehealth: Payer: Self-pay | Admitting: Nurse Practitioner

## 2016-05-16 DIAGNOSIS — G901 Familial dysautonomia [Riley-Day]: Secondary | ICD-10-CM

## 2016-05-16 NOTE — Telephone Encounter (Signed)
I was asked by Everlean Patterson, director of [facility], to clarify home health orders for patient in Dr Olin Pia absence.  Per [facility], they need an order stating "provide Home Health and infusion serves for evaluation and treatment of dehydration or other relevant diagnosis".  Dr Caryl Comes has deemed that the patient is appropriate for home health services but the order previously sent needed to be clarified.  Will place one time order until Dr Caryl Comes is back in the office and he can renew.  Chanetta Marshall, NP 05/16/2016 3:48 PM

## 2016-05-18 ENCOUNTER — Ambulatory Visit (HOSPITAL_COMMUNITY)
Admission: RE | Admit: 2016-05-18 | Discharge: 2016-05-18 | Disposition: A | Discharge status: Home / Self Care | Payer: Medicare Other | Source: Ambulatory Visit | Attending: Internal Medicine | Admitting: Internal Medicine

## 2016-05-18 ENCOUNTER — Observation Stay (HOSPITAL_COMMUNITY)
Admission: AD | Admit: 2016-05-18 | Discharge: 2016-05-18 | Disposition: A | Discharge status: Home / Self Care | Payer: Medicare Other | Source: Ambulatory Visit | Attending: Internal Medicine | Admitting: Internal Medicine

## 2016-05-18 ENCOUNTER — Encounter: Payer: Self-pay | Admitting: Internal Medicine

## 2016-05-18 ENCOUNTER — Other Ambulatory Visit: Payer: Self-pay | Admitting: Nurse Practitioner

## 2016-05-18 ENCOUNTER — Encounter: Payer: Self-pay | Admitting: Nurse Practitioner

## 2016-05-18 DIAGNOSIS — G901 Familial dysautonomia [Riley-Day]: Principal | ICD-10-CM | POA: Insufficient documentation

## 2016-05-18 DIAGNOSIS — Z79899 Other long term (current) drug therapy: Secondary | ICD-10-CM | POA: Insufficient documentation

## 2016-05-18 DIAGNOSIS — E860 Dehydration: Secondary | ICD-10-CM | POA: Diagnosis present

## 2016-05-18 MED ORDER — SODIUM CHLORIDE 0.9 % IV SOLN: INTRAVENOUS | Status: DC

## 2016-05-18 NOTE — Progress Notes (Signed)
Pt placed in 1334 for IVF administration. Floor RN, IV RN, and CRNA unable to establish access. On call notified.  Orthostatic vitals obtained and on call made aware.

## 2016-05-18 NOTE — Progress Notes (Signed)
Called by RN at [facility] re: IV access  Pt requires regular IVF for autonomic dysfunction, she does not maintain her blood pressure with activity.   She came in for the IVF, but 2 RNs and IV team members were unable to get an IV.  Orthostatic VS were checked and were acceptable. She was able to stand for 3 minutes and did ok with this.  Offered her options. 1) go to the ER, let them start IV, they may have to use the external jugular vein 2) be admitted for IVF, staff to continue to try, she would be there overnight 3) Go home, continue oral hydration and the office can call her on Monday. Contact us for worsening symptoms.   Discussed the situation thoroughly. She refuses ER, does not wish to be admitted. Agrees that temporarily she can maintain volume by oral means, if she does not do too much.  She has had some missed IVs that have taken away several sites previously used. Peripheral IV access is becoming more problematic.   f/u next week with office.   [name] 05/18/2016 7:23 PM Beeper [phone]

## 2016-05-21 ENCOUNTER — Ambulatory Visit (HOSPITAL_BASED_OUTPATIENT_CLINIC_OR_DEPARTMENT_OTHER): Payer: Medicare Other | Admitting: Oncology

## 2016-05-21 ENCOUNTER — Telehealth: Payer: Self-pay | Admitting: Oncology

## 2016-05-21 VITALS — BP 131/79 | HR 71 | Temp 98.3°F | Resp 18 | Wt 153.5 lb

## 2016-05-21 DIAGNOSIS — C799 Secondary malignant neoplasm of unspecified site: Secondary | ICD-10-CM

## 2016-05-21 DIAGNOSIS — C7931 Secondary malignant neoplasm of brain: Secondary | ICD-10-CM

## 2016-05-21 DIAGNOSIS — C439 Malignant melanoma of skin, unspecified: Secondary | ICD-10-CM

## 2016-05-21 DIAGNOSIS — R197 Diarrhea, unspecified: Secondary | ICD-10-CM

## 2016-05-21 NOTE — Telephone Encounter (Signed)
Gave patient AVS and calender per 05/21/2016 los.

## 2016-05-21 NOTE — Progress Notes (Signed)
[place] OFFICE PROGRESS NOTE   Diagnosis: Melanoma  INTERVAL HISTORY:   [name] returns as scheduled. She continues to have intermittent diarrhea. She receives IV fluids several times per month by cardiology for "autonomic "dysfunction. She was seen at [facility] on 05/03/2016. CTs showed no evidence of recurrent disease.  Objective:  Vital signs in last 24 hours:  Blood pressure 131/79, pulse 71, temperature 98.3 F (36.8 C), temperature source Oral, resp. rate 18, weight 153 lb 8 oz (69.6 kg), SpO2 100 %.    HEENT: Neck without mass Lymphatics: No cervical, supraclavicular, axillary, or inguinal nodes Resp: Lungs clear bilaterally Cardio: Regular rate and rhythm GI: No hepatosplenomegaly, no mass, tender in the lower abdomen bilaterally Vascular: No leg edema  Skin: Right arm scar without evidence of recurrent tumor   Labs: 05/03/2016 at [facility]-chemotherapy and 12.8, MCV 86.7  Medications: I have reviewed the patient's current medications.  Assessment/Plan: 1.Metastatic melanoma  -Status post resection of a right arm melanoma, stage II A (T2b N0) in December 2009  -Metastatic melanoma, status post resection of a left cerebellar metastasis 09/30/2012, BRAF mutation identified  -Status post SRS treatment of the left cerebellum and a left parietal metastasis 10/24/2012  -Staging CTs and PET scan without evidence of distant metastatic disease aside from indeterminate small lung nodules  -Restaging CTs 12/01/2012 consistent with enlargement of the left parietal mass and right lower lobe lung nodule  -Initiation of dabrafenib and tremetinib 12/17/2012. She reports extremely poor tolerance. Discontinued.  -Status post resection of a left parietal metastasis 02/09/2013.  -Restaging CT evaluation 03/03/2013. Stable 1.5 cm right lower lobe pulmonary nodule. Multiple peripheral enhancing lesions throughout the liver.  -Initiation of Ipilimumab at [facility] 03/10/2013.   -Cycle 2 Ipilumumab 04/17/2013  -Cycle 3 Ipilumumab 05/14/2013  -Cycle 4 Ipilumumab 06/04/2013  -Restaging CT at [facility] 06/25/2013 with multiple subcentimeter liver lesions, no new lesions, decreased right lower lobe nodule.  -Brain MRI 07/07/2013 with no evidence of progressive metastatic disease.  -Brain CT 10/09/2013 with no evidence of progressive metastatic disease  -Brain CT and CTs of the chest, abdomen, and pelvis at [facility] on 11/03/2013 with no evidence of progressive disease  -CTs of the brain, chest, abdomen, and pelvis at [facility] on 02/02/2014 with no evidence of disease progression -CTs of the brain, chest, abdomen, and pelvis at [facility] on 05/04/2014-no evidence of disease progression -CTs of the brain, chest, abdomen, and pelvis at [facility] on 08/03/2014-stable -CTs of the brain, chest, abdomen, and pelvis at [facility] on 11/02/2014-stable -CTs of the brain, chest, abdomen, and pelvis at [facility] on 02/08/2015-stable -CTs of the brain, chest, abdomen, and pelvis at [facility] on 05/10/2015-stable  -CTs of the brain, chest, abdomen, and pelvis at [facility] on 05/03/2016-no evidence of recurrent disease 2. Arrhythmia, pacemaker in place.  3. Hypertension  4. History of a CVA  5. History of deep vein thrombosis  6. History of proximal leg weakness-likely related to chronic steroid use.  7. Intermittent rash at the posterior thighs and left hand-likely related to systemic therapy.  8. Leg edema-? Related to liver disease,? Secondary to ipilumumab . Improved on furosemide.  9. Headache and mild nausea-coincided with discontinuation of Decadron, CT of the brain 03/25/2011 with no acute change.  10. Diarrhea secondary to Ipilumumab-resolved 11. History of Mild elevation of lipase-likely mild asymptomatic pancreatitis related to Ipilumumab  12.  history of Red cell microcytosis and iron deficiency 03/17/2014- negative stool Hemoccults, improved   Disposition:  [name] remains in clinical remission  from  melanoma. She continues CT follow-up at [facility]. She will return for an office visit in 6 months.  I am available to see her in the interim as needed.  15 minutes were spent with the patient today. The majority of the time was used for counseling and coordination of care.   Betsy Coder, MD  05/21/2016  1:57 PM

## 2016-05-21 NOTE — Telephone Encounter (Signed)
This encounter was created in error - please disregard.

## 2016-05-22 DIAGNOSIS — Z85828 Personal history of other malignant neoplasm of skin: Secondary | ICD-10-CM | POA: Diagnosis not present

## 2016-05-22 DIAGNOSIS — M71372 Other bursal cyst, left ankle and foot: Secondary | ICD-10-CM | POA: Diagnosis not present

## 2016-05-22 DIAGNOSIS — Z85820 Personal history of malignant melanoma of skin: Secondary | ICD-10-CM | POA: Diagnosis not present

## 2016-05-22 DIAGNOSIS — L814 Other melanin hyperpigmentation: Secondary | ICD-10-CM | POA: Diagnosis not present

## 2016-05-22 NOTE — Progress Notes (Signed)
Patient ID: [name], female   DOB: 1955/12/11, 61 y.o.   MRN: [id number]   This patient contacted the office stating she was unable to arrange her IV infusion at short stay after her [place] agency visit fell through.  After calling around the health system to see how this can be accomplished it was arranged for her to have an outpatient bed at [facility] to obtain the needed fluids.  She was notified to go to admission to start the process and she verbalized understanding.

## 2016-05-23 ENCOUNTER — Telehealth: Payer: Self-pay | Admitting: Internal Medicine

## 2016-05-23 NOTE — Telephone Encounter (Signed)
Called patient this am she was asking for copies of [facility] orders wrote by Dr.Klein. I made patient aware I cannot  Release information to her that is not our records patient understood this. I also made her aware she may need to call [facility] and see if she can obtain this information from them. Patient understood everything I was saying and stated she would call [facility]. She Thanked me for my call.

## 2016-05-24 ENCOUNTER — Telehealth: Payer: Self-pay | Admitting: Internal Medicine

## 2016-05-24 DIAGNOSIS — Z85828 Personal history of other malignant neoplasm of skin: Secondary | ICD-10-CM | POA: Diagnosis not present

## 2016-05-24 DIAGNOSIS — D2261 Melanocytic nevi of right upper limb, including shoulder: Secondary | ICD-10-CM | POA: Diagnosis not present

## 2016-05-24 DIAGNOSIS — Z85820 Personal history of malignant melanoma of skin: Secondary | ICD-10-CM | POA: Diagnosis not present

## 2016-05-24 DIAGNOSIS — L814 Other melanin hyperpigmentation: Secondary | ICD-10-CM | POA: Diagnosis not present

## 2016-05-24 DIAGNOSIS — D485 Neoplasm of uncertain behavior of skin: Secondary | ICD-10-CM | POA: Diagnosis not present

## 2016-05-24 NOTE — Telephone Encounter (Signed)
Spoke with  JM  I told her that I was unable to support a order for homebound care. I have spoken with Walker Kehr regarding short stay options. We will plan to meet with the head of short stay and the patient  and her husband to discuss what is available within   [facility] system to meet her needs.  She has been sent information from [employer] regarding alternative agencies. One was [facility] (sp?)  When the vertigo reached out to them, they said they were unable to discuss it with or without a physician's order. She will for DVT evening meal I will make a phone call trying to ascertain whether sits services are available to her on a self-pay basis.

## 2016-06-13 NOTE — Telephone Encounter (Signed)
Encounter closed

## 2016-06-20 ENCOUNTER — Other Ambulatory Visit (HOSPITAL_COMMUNITY): Payer: Self-pay | Admitting: *Deleted

## 2016-06-25 ENCOUNTER — Telehealth: Payer: Self-pay

## 2016-06-25 ENCOUNTER — Encounter (HOSPITAL_COMMUNITY)
Admission: RE | Admit: 2016-06-25 | Discharge: 2016-06-25 | Disposition: A | Discharge status: Home / Self Care | Payer: Medicare Other | Source: Ambulatory Visit | Attending: Internal Medicine | Admitting: Internal Medicine

## 2016-06-25 DIAGNOSIS — I951 Orthostatic hypotension: Principal | ICD-10-CM

## 2016-06-25 DIAGNOSIS — R000 Tachycardia, unspecified: Principal | ICD-10-CM

## 2016-06-25 DIAGNOSIS — I498 Other specified cardiac arrhythmias: Secondary | ICD-10-CM | POA: Insufficient documentation

## 2016-06-25 DIAGNOSIS — G90A Postural orthostatic tachycardia syndrome (POTS): Secondary | ICD-10-CM

## 2016-06-25 MED ORDER — SODIUM CHLORIDE 0.9 % IV SOLN
INTRAVENOUS | Status: DC
  Administered 2016-06-25: 10:00:00 via INTRAVENOUS

## 2016-06-25 NOTE — Telephone Encounter (Signed)
06/25/2016 !820 (late entry 1620) Message received to call patient.  Call to [name] who shared her experience today receiving outpatient IV infusion NS.  [name] explained the infusion was started at 500cc/hr which eventually caused her discomfort due to her venous status. The rate was discreased to 300cc/hr however after receiving only 800cc total the infusion became too painful so it was discontinued.   [name] shared she doesn't drive often due to antiseizure medication and not feeling well enough to drive most of the time. She explained this makes it difficult for her to get to AM appts. because she waits (as directed by her neurosurgeon) 3 hrs after taking her Keppra to drive.  Her husband usually drives her to appointments, however he works a full time job during the day.  We spoke about the availability of various home care agencies in the area.  [name] agreed to contacting [facility] to see if they would be willing to provide home care IV infusion services with a peripheral line, and possibly self pay situation. [name] agreed to Probation officer sharing her phone number with [facility].  Call placed to [name] with [facility], patient's case was discussed and Lattie Haw plans to contact [facility] either later this evening or tomorrow for additional information.  Writer will follow up with Amanda [facility] tomorrow as promised.  [name] MHA RN CCM

## 2016-06-25 NOTE — Progress Notes (Signed)
Pt stated that her IV site was still hurting and wanted it slowed down.  She is now going at a rate of 300 ml/hour.  The site is WNL and is working properly.  Will continue to minotor

## 2016-06-25 NOTE — Progress Notes (Signed)
At the time the infusion was stopped there was approximately 200 ml left in the bag

## 2016-06-25 NOTE — Progress Notes (Signed)
PT called [name] back in the room and stated that her PIV was too painful and she wanted to stop the infusion.  I stopped the Saline.  Her IV was WNL.  Pt stated that it was hurting in her shoulder.

## 2016-06-25 NOTE — Progress Notes (Signed)
Called into room by patient who stated that her PIV was hurting.  I stopped the fluids, checked the site and pulled back ample amounts of blood without difficulty, site not red or swollen and is soft to the touch.  I told the patient the rate of NS is faster then she is used to receiving and it could be she is just noticing the infusion  More because of that.  I offered her a heat pack to place on the site for comfort and she accepted that.

## 2016-06-25 NOTE — Progress Notes (Signed)
Went to check back in with the patient about her PIV and how she was feeling.  She stated it hurts up her arm still.  The site remains WNL and is working properly.  I asked the patient if she was unable to tolerate the rate and she said she only wants one liter of fluids today so could I slow the rate down to 350.  I slowed the rate down per pt request.

## 2016-06-27 DIAGNOSIS — I498 Other specified cardiac arrhythmias: Secondary | ICD-10-CM | POA: Diagnosis not present

## 2016-06-27 DIAGNOSIS — E119 Type 2 diabetes mellitus without complications: Secondary | ICD-10-CM | POA: Diagnosis not present

## 2016-06-27 DIAGNOSIS — G908 Other disorders of autonomic nervous system: Secondary | ICD-10-CM | POA: Diagnosis not present

## 2016-06-27 DIAGNOSIS — Z85841 Personal history of malignant neoplasm of brain: Secondary | ICD-10-CM | POA: Diagnosis not present

## 2016-06-27 DIAGNOSIS — I69398 Other sequelae of cerebral infarction: Secondary | ICD-10-CM | POA: Diagnosis not present

## 2016-06-27 DIAGNOSIS — R42 Dizziness and giddiness: Secondary | ICD-10-CM | POA: Diagnosis not present

## 2016-06-28 NOTE — Telephone Encounter (Signed)
06/28/2016 1405 Call from [place] with [facility] on 06/27/2016.  Per Janett Billow she "certifies" patient meets homebound criteria and will be admitting for home health services. Text to Dr Caryl Comes to notify him of above findings. Call to patient today, she discussed issues which arose from home health assessment such as short term memory loss, neuropathy in hands etc... Patient states she is contacted the oncology radiologist and Dr Clarice Pole office to discuss these concerns.  Writer will follow up with Dr Caryl Comes to request home infusion orders. [name] MHA RN CCM

## 2016-06-29 ENCOUNTER — Other Ambulatory Visit (HOSPITAL_COMMUNITY): Payer: Self-pay | Admitting: *Deleted

## 2016-07-02 ENCOUNTER — Telehealth: Payer: Self-pay | Admitting: Internal Medicine

## 2016-07-02 ENCOUNTER — Encounter (HOSPITAL_COMMUNITY): Payer: Medicare Other

## 2016-07-02 NOTE — Telephone Encounter (Signed)
Judson Roch is calling for clarity on orders for fluids. 1-She needs to know how many bags (1 liter) per setting. 2-She needs to know how many bags a week.  Will forward to Dr. Caryl Comes and his nurse.

## 2016-07-02 NOTE — Telephone Encounter (Signed)
New Message  Sarah from [facility] call requesting to get a verbal or signed order for pts hydration. please call back to discuss

## 2016-07-02 NOTE — Addendum Note (Signed)
Addended by: [name] D on: 07/02/2016 12:47 PM   Modules accepted: Orders

## 2016-07-03 NOTE — Telephone Encounter (Signed)
Per Dr. Caryl Comes- infuse 2 liters once at week at 250 cc/hr. I have notified Sarah (pharmacy) from [place] of this. Judson Roch states she spoke with someone from our [place] office earlier today to confirm. Per Judson Roch, she also spoke with the patient today who was upset due to the fact that she wanted to use her on [place] pump and she advised the patient this is not their policy- they send the patient a pump that has been pre-set for infusion.  Judson Roch also stated that typically patient's that are receiving hydration are done free-flow, but they will be sending her out a pump next week.

## 2016-07-04 DIAGNOSIS — C7931 Secondary malignant neoplasm of brain: Secondary | ICD-10-CM | POA: Diagnosis not present

## 2016-07-04 DIAGNOSIS — C7949 Secondary malignant neoplasm of other parts of nervous system: Secondary | ICD-10-CM | POA: Diagnosis not present

## 2016-07-05 ENCOUNTER — Telehealth: Payer: Self-pay | Admitting: Internal Medicine

## 2016-07-05 NOTE — Telephone Encounter (Signed)
New Message     Pt wants to hold off on IV fluids , does not want to start 07/06/16

## 2016-07-05 NOTE — Telephone Encounter (Signed)
Noted  

## 2016-07-11 DIAGNOSIS — G908 Other disorders of autonomic nervous system: Secondary | ICD-10-CM | POA: Diagnosis not present

## 2016-07-11 DIAGNOSIS — R42 Dizziness and giddiness: Secondary | ICD-10-CM | POA: Diagnosis not present

## 2016-07-11 DIAGNOSIS — Z85841 Personal history of malignant neoplasm of brain: Secondary | ICD-10-CM | POA: Diagnosis not present

## 2016-07-11 DIAGNOSIS — E119 Type 2 diabetes mellitus without complications: Secondary | ICD-10-CM | POA: Diagnosis not present

## 2016-07-11 DIAGNOSIS — I69398 Other sequelae of cerebral infarction: Secondary | ICD-10-CM | POA: Diagnosis not present

## 2016-07-11 DIAGNOSIS — I498 Other specified cardiac arrhythmias: Secondary | ICD-10-CM | POA: Diagnosis not present

## 2016-07-17 ENCOUNTER — Telehealth: Payer: Self-pay | Admitting: Radiation Therapy

## 2016-07-17 NOTE — Telephone Encounter (Signed)
I spoke with [name] on 5/21 to see how she is doing and when her next scans at [facility] are scheduled. She is still being managed by Dr. Harriet Masson and continues to follow-up with Dr. Ellene Route, Dr. Benay Spice and Dr. Lisbeth Renshaw. Her next scans are scheduled in September and she would like a visit to see Dr. Lisbeth Renshaw shortly afterwards.     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 In addition, she has recently started PT with the hopes that it will improve the strength in her lower extremities.      [name] spoke with Dr. Ellene Route and Dr. Harriet Masson about these issues during her last follow-up visits. Their thoughts were that these issues may be caused by the immunotherapy she has received and an overall result of her taxing four year journey through cancer treatments. [name] would like Dr. Lisbeth Renshaw to be aware of these issues to see if he has any additional suggestions of what she can do to improve.       Overall she is still in good spirits and knows that she is doing well in the grand scheme of things.   [name] will call if she has issues requiring she be seen earlier, but for now the plan is to see her in September after her repeat systemic imaging is completed.    Mont Dutton R.T.(R)(T) Special Procedures Navigator

## 2016-07-27 DIAGNOSIS — E119 Type 2 diabetes mellitus without complications: Secondary | ICD-10-CM | POA: Diagnosis not present

## 2016-07-27 DIAGNOSIS — G908 Other disorders of autonomic nervous system: Secondary | ICD-10-CM | POA: Diagnosis not present

## 2016-07-27 DIAGNOSIS — I498 Other specified cardiac arrhythmias: Secondary | ICD-10-CM | POA: Diagnosis not present

## 2016-07-27 DIAGNOSIS — Z85841 Personal history of malignant neoplasm of brain: Secondary | ICD-10-CM | POA: Diagnosis not present

## 2016-07-27 DIAGNOSIS — I69398 Other sequelae of cerebral infarction: Secondary | ICD-10-CM | POA: Diagnosis not present

## 2016-07-27 DIAGNOSIS — R42 Dizziness and giddiness: Secondary | ICD-10-CM | POA: Diagnosis not present

## 2016-07-31 DIAGNOSIS — Z79899 Other long term (current) drug therapy: Secondary | ICD-10-CM | POA: Diagnosis not present

## 2016-07-31 DIAGNOSIS — C787 Secondary malignant neoplasm of liver and intrahepatic bile duct: Secondary | ICD-10-CM | POA: Diagnosis not present

## 2016-07-31 DIAGNOSIS — C439 Malignant melanoma of skin, unspecified: Secondary | ICD-10-CM | POA: Diagnosis not present

## 2016-07-31 DIAGNOSIS — E118 Type 2 diabetes mellitus with unspecified complications: Secondary | ICD-10-CM | POA: Diagnosis not present

## 2016-07-31 DIAGNOSIS — C7931 Secondary malignant neoplasm of brain: Secondary | ICD-10-CM | POA: Diagnosis not present

## 2016-08-10 DIAGNOSIS — I498 Other specified cardiac arrhythmias: Secondary | ICD-10-CM | POA: Diagnosis not present

## 2016-08-10 DIAGNOSIS — E119 Type 2 diabetes mellitus without complications: Secondary | ICD-10-CM | POA: Diagnosis not present

## 2016-08-10 DIAGNOSIS — I69398 Other sequelae of cerebral infarction: Secondary | ICD-10-CM | POA: Diagnosis not present

## 2016-08-10 DIAGNOSIS — Z85841 Personal history of malignant neoplasm of brain: Secondary | ICD-10-CM | POA: Diagnosis not present

## 2016-08-10 DIAGNOSIS — G908 Other disorders of autonomic nervous system: Secondary | ICD-10-CM | POA: Diagnosis not present

## 2016-08-10 DIAGNOSIS — R42 Dizziness and giddiness: Secondary | ICD-10-CM | POA: Diagnosis not present

## 2016-08-24 DIAGNOSIS — R42 Dizziness and giddiness: Secondary | ICD-10-CM | POA: Diagnosis not present

## 2016-08-24 DIAGNOSIS — Z85841 Personal history of malignant neoplasm of brain: Secondary | ICD-10-CM | POA: Diagnosis not present

## 2016-08-24 DIAGNOSIS — I69398 Other sequelae of cerebral infarction: Secondary | ICD-10-CM | POA: Diagnosis not present

## 2016-08-24 DIAGNOSIS — I498 Other specified cardiac arrhythmias: Secondary | ICD-10-CM | POA: Diagnosis not present

## 2016-08-24 DIAGNOSIS — E119 Type 2 diabetes mellitus without complications: Secondary | ICD-10-CM | POA: Diagnosis not present

## 2016-08-24 DIAGNOSIS — G908 Other disorders of autonomic nervous system: Secondary | ICD-10-CM | POA: Diagnosis not present

## 2016-08-26 DIAGNOSIS — E119 Type 2 diabetes mellitus without complications: Secondary | ICD-10-CM | POA: Diagnosis not present

## 2016-08-26 DIAGNOSIS — Z85841 Personal history of malignant neoplasm of brain: Secondary | ICD-10-CM | POA: Diagnosis not present

## 2016-08-26 DIAGNOSIS — R42 Dizziness and giddiness: Secondary | ICD-10-CM | POA: Diagnosis not present

## 2016-08-26 DIAGNOSIS — I69398 Other sequelae of cerebral infarction: Secondary | ICD-10-CM | POA: Diagnosis not present

## 2016-08-26 DIAGNOSIS — G908 Other disorders of autonomic nervous system: Secondary | ICD-10-CM | POA: Diagnosis not present

## 2016-08-26 DIAGNOSIS — I498 Other specified cardiac arrhythmias: Secondary | ICD-10-CM | POA: Diagnosis not present

## 2016-08-27 ENCOUNTER — Ambulatory Visit (INDEPENDENT_AMBULATORY_CARE_PROVIDER_SITE_OTHER): Payer: Medicare Other | Admitting: Internal Medicine

## 2016-08-27 ENCOUNTER — Encounter: Payer: Self-pay | Admitting: Internal Medicine

## 2016-08-27 VITALS — BP 110/80 | HR 60 | Temp 97.5°F | Wt 152.0 lb

## 2016-08-27 DIAGNOSIS — Z79899 Other long term (current) drug therapy: Secondary | ICD-10-CM | POA: Diagnosis not present

## 2016-08-27 DIAGNOSIS — J988 Other specified respiratory disorders: Secondary | ICD-10-CM | POA: Diagnosis not present

## 2016-08-27 DIAGNOSIS — R062 Wheezing: Secondary | ICD-10-CM

## 2016-08-27 DIAGNOSIS — J329 Chronic sinusitis, unspecified: Secondary | ICD-10-CM | POA: Diagnosis not present

## 2016-08-27 DIAGNOSIS — J069 Acute upper respiratory infection, unspecified: Secondary | ICD-10-CM

## 2016-08-27 MED ORDER — IPRATROPIUM-ALBUTEROL 0.5-2.5 (3) MG/3ML IN SOLN
3.0000 mL | Freq: Once | RESPIRATORY_TRACT | Status: AC
  Administered 2016-08-27: 3 mL via RESPIRATORY_TRACT

## 2016-08-27 NOTE — Progress Notes (Signed)
Chief Complaint  Patient presents with  . Wheezing  . Fever    only in the evening  . Chills    HPI: [name] 61 y.o.  sda   Hs of chronic  sinus problems and  Left side nose bleeds in past  Who comes in today with 3-4 days of pm sore throat and chills temp in 100 range   And  Dry cough that seems from pnd  Although some tightness and wheeze feeling lijke asthma( child hx asthma) at night. Used albuterol inhaler once  3 days ago  Not sure of helps  Hx of nasal cauterization other  In past  . flonase caused irritation in past .  Uses saline  Daily religiously .    Dr Warren Danes is ent .   On continued immunosuppressive  Therapy Has used diffusing of essential oils lavendaer and peppermit aggraated eyes  Etc .  ROS: See pertinent positives and negatives per HPI. Hx bleeding left nares and procedure  Still has issues no face pain   Past Medical History:  Diagnosis Date  . Asthma   . Atrial fibrillation ([place])   . Atrial tachycardia ([place])   . Cervicalgia   . Complication of anesthesia    pt states wakes up with "shakes"  . CVA (cerebral infarction)    2012 with dizziness and vision change felt embolic from atrial tachy  . Disorder of bone and cartilage, unspecified   . Disturbance of skin sensation   . Diverticulosis   . DM (diabetes mellitus) ([place])   . DVT (deep venous thrombosis) ([place])   . DVT (deep venous thrombosis) ([place])   . Family history of anesthesia complication    PONV  . History of pacemaker   . History of radiation therapy 10/24/12   brain  . HLD (hyperlipidemia)   . HTN (hypertension)   . Hypoglycemia, unspecified   . 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 Osteopenia   . Other nonspecific abnormal serum enzyme levels   . Other specified congenital anomalies of nervous system   . Pacemaker    autonomic dysfunction  . Pancreatitis    from therapy  . PONV (postoperative nausea and vomiting)   . POTS (postural orthostatic  tachycardia syndrome)   . Skin cancer    Hx: of lung lesion  . Stroke [facility])    left weaker   . Swelling of limb     Family History  Problem Relation Age of Onset  . Stroke Mother   . Colon polyps Mother   . Heart disease Father   . Diabetes Father   . Kidney disease Father   . Diabetes Maternal Grandmother   . Clotting disorder Maternal Grandmother        stroke  . Crohn's disease Maternal Grandmother   . Diabetes Paternal Grandmother   . Aneurysm Sister        brain    Social History   Social History  . Marital status: Married    Spouse name: N/A  . Number of children: 0  . Years of education: N/A   Occupational History  . [employer]    self employed   Social History Main Topics  . Smoking status: Never Smoker  . Smokeless tobacco: Never Used  . Alcohol use No     Comment: glass of wine daily  . Drug use: No  . Sexual activity: Not Asked   Other  Topics Concern  . None   Social History Narrative   4 years college hh of 2    Neg tad    Outpatient Medications Prior to Visit  Medication Sig Dispense Refill  . acetaminophen (TYLENOL) 500 MG tablet Take 500 mg by mouth every 6 (six) hours as needed for pain.     Marland Kitchen albuterol (VENTOLIN HFA) 108 (90 Base) MCG/ACT inhaler Inhale 2 puffs into the lungs as needed. 1 Inhaler 1  . baclofen (LIORESAL) 10 MG tablet TAKE 0.5 TABLETS BY MOUTH 2 TIMES A DAY AS NEEDED FOR MUSCLE SPASMS.  3  . cloNIDine (CATAPRES) 0.1 MG tablet Take 1 tablet (0.1 mg total) by mouth at bedtime. 90 tablet 3  . cyanocobalamin (,VITAMIN B-12,) 1000 MCG/ML injection Inject 1 mL (1,000 mcg total) into the skin every 30 (thirty) days. 1 mL 11  . diphenhydrAMINE (BENADRYL) 25 MG tablet Take 12.5 mg by mouth every 6 (six) hours as needed. Seasonal allergies    . esomeprazole (NEXIUM) 40 MG capsule TAKE ONE CAPSULE BY MOUTH TWICE DAILY BEFORE MEALS 60 capsule 2  . fluorometholone (FML) 0.1 % ophthalmic suspension 1 DROP INTO BOTH  EYES AS DIRECTED 4X DAILY X 2 WEEKS THEN TWICE DAILY X 2 WEEKS  1  . fluticasone (FLONASE) 50 MCG/ACT nasal spray Place 2 sprays into both nostrils daily. As directed.  3  . furosemide (LASIX) 40 MG tablet Take 20 mg by mouth daily. Takes PRN    . hydrocortisone (ANUSOL-HC) 25 MG suppository Place 1 suppository (25 mg total) rectally at bedtime as needed for hemorrhoids. 12 suppository 0  . hydrocortisone 2.5 % cream Apply 1 application topically 2 (two) times daily as needed (irritation). 30 g 0  . levETIRAcetam (KEPPRA) 500 MG tablet Take 500 mg by mouth 2 (two) times daily.     Marland Kitchen LOTEMAX 0.5 % ophthalmic suspension 1 DROP EVERY 2 HOURS TO BOTH EYES X 1 DAY, THEN 4 TIMES DAILY X 1 WEEK THEN TWICE DAILY X 1 WEEK  0  . mesalamine (CANASA) 1000 MG suppository Place 1 suppository (1,000 mg total) rectally at bedtime as needed (hemorrhoids). 30 suppository 0  . metFORMIN (GLUCOPHAGE) 500 MG tablet Take 500 mg by mouth daily with breakfast.  3  . metoprolol tartrate (LOPRESSOR) 25 MG tablet Take 1 tablet (25 mg total) by mouth 2 (two) times daily. 180 tablet 3  . nystatin (MYCOSTATIN) 100000 UNIT/ML suspension Take 5 mLs (500,000 Units total) by mouth 4 (four) times daily. (Patient taking differently: Take 5 mLs by mouth as needed. ) 60 mL 0  . ondansetron (ZOFRAN-ODT) 4 MG disintegrating tablet Take 4 mg by mouth as needed. 4mg  ODT q4 hours prn nausea/vomit    . potassium chloride SA (K-DUR,KLOR-CON) 20 MEQ tablet TAKE ONE TABLET BY MOUTH TWICE DAILY 60 tablet 3  . Syringe/Needle, Disp, (SYRINGE 3CC/27GX1-1/4") 27G X 1-1/4" 3 ML MISC Inject 1 mL into the muscle every 30 (thirty) days. 12 each 0  . Vitamin D, Ergocalciferol, (DRISDOL) 50000 units CAPS capsule Take 1 capsule (50,000 Units total) by mouth every 7 (seven) days. 4 capsule 11   No facility-administered medications prior to visit.      EXAM:  BP 110/80 (BP Location: Right Arm, Patient Position: Sitting, Cuff Size: Normal)   Pulse 60    Temp 97.5 F (36.4 C) (Oral)   Wt 152 lb (68.9 kg)   SpO2 97%   BMI 26.93 kg/m   Body mass index is 26.93 kg/m.  GENERAL: vitals reviewed and listed above, alert, oriented, appears well hydrated and in no acute distress mild hoarseness  No stridor  HEENT: atraumatic, conjunctiva  clear, no obvious abnormalities on inspection of external nose and ears tmx clear nares hard to visualize OP : no lesion edema or exudate  Post pharyngeal wall with red cobblestoning  NECK: no obvious masses on inspection palpation  LUNGS:  bs = rare exp wheeze   After duoneb  Clear and she says feels less tight  CV: HRRR, no clubbing cyanosis or  peripheral edema nl cap refill  MS: moves all extremities without noticeable focal  abnormality PSYCH: pleasant and cooperative, no obvious depression or anxiety  ASSESSMENT AND PLAN:  Discussed the following assessment and plan:  Wheezing-associated respiratory infection (WARI) - Plan: ipratropium-albuterol (DUONEB) 0.5-2.5 (3) MG/3ML nebulizer solution 3 mL  Upper respiratory tract infection, unspecified type  Wheezing - Plan: ipratropium-albuterol (DUONEB) 0.5-2.5 (3) MG/3ML nebulizer solution 3 mL  Chronic recurrent sinusitis  High risk medication use Past hx of asthma  Mild  Bronchospasm   aero chamber  rx  To use with albuterol  Caution with inhaler oils that can be a resp irritant at times .   Dr Warren Danes to opine on nasal steroids  -Patient advised to return or notify health care team  if symptoms worsen ,persist or new concerns arise.  Patient Instructions  This could be a viral respiratory infection  Aggravating your sinus condition and causing secondary wheeziness .   Go ahead and use the albuterol maximum every 6 hours as needed .  Try with the spacer .    Wheezing usually get worse at  Night . If fever if  persistent or progressive   Get back with [name]   Stay  on saline   For the sinus problem  Ask dr Warren Danes about different nasal steroid  Sprays  not reg Flonase .  Such as the mist or other  Options and ask if may help  Your chronic sinus problem.        Standley Brooking. Panosh M.D.

## 2016-08-27 NOTE — Patient Instructions (Addendum)
This could be a viral respiratory infection  Aggravating your sinus condition and causing secondary wheeziness .   Go ahead and use the albuterol maximum every 6 hours as needed .  Try with the spacer .    Wheezing usually get worse at  Night . If fever if  persistent or progressive   Get back with [name]   Stay  on saline   For the sinus problem  Ask dr Warren Danes about different nasal steroid  Sprays not reg Flonase .  Such as the mist or other  Options and ask if may help  Your chronic sinus problem.

## 2016-09-07 DIAGNOSIS — Z85841 Personal history of malignant neoplasm of brain: Secondary | ICD-10-CM | POA: Diagnosis not present

## 2016-09-07 DIAGNOSIS — E119 Type 2 diabetes mellitus without complications: Secondary | ICD-10-CM | POA: Diagnosis not present

## 2016-09-07 DIAGNOSIS — R42 Dizziness and giddiness: Secondary | ICD-10-CM | POA: Diagnosis not present

## 2016-09-07 DIAGNOSIS — G908 Other disorders of autonomic nervous system: Secondary | ICD-10-CM | POA: Diagnosis not present

## 2016-09-07 DIAGNOSIS — I69398 Other sequelae of cerebral infarction: Secondary | ICD-10-CM | POA: Diagnosis not present

## 2016-09-07 DIAGNOSIS — I498 Other specified cardiac arrhythmias: Secondary | ICD-10-CM | POA: Diagnosis not present

## 2016-09-11 ENCOUNTER — Telehealth: Payer: Self-pay | Admitting: Internal Medicine

## 2016-09-11 NOTE — Telephone Encounter (Signed)
New message     Need order for an extra week of IV access to this week

## 2016-09-11 NOTE — Telephone Encounter (Signed)
I spoke with Janett Billow, RN with [facility]. She states she tried to stick the patient for infusion last week, but was unsuccessful- she is usually able to obtain access.  Order needed for re-attempt at IV access this week.  I advised ok per Dr. Caryl Comes.

## 2016-09-12 ENCOUNTER — Telehealth: Payer: Self-pay | Admitting: Internal Medicine

## 2016-09-12 DIAGNOSIS — I498 Other specified cardiac arrhythmias: Secondary | ICD-10-CM | POA: Diagnosis not present

## 2016-09-12 DIAGNOSIS — Z85841 Personal history of malignant neoplasm of brain: Secondary | ICD-10-CM | POA: Diagnosis not present

## 2016-09-12 DIAGNOSIS — G908 Other disorders of autonomic nervous system: Secondary | ICD-10-CM | POA: Diagnosis not present

## 2016-09-12 DIAGNOSIS — E119 Type 2 diabetes mellitus without complications: Secondary | ICD-10-CM | POA: Diagnosis not present

## 2016-09-12 DIAGNOSIS — R42 Dizziness and giddiness: Secondary | ICD-10-CM | POA: Diagnosis not present

## 2016-09-12 DIAGNOSIS — I69398 Other sequelae of cerebral infarction: Secondary | ICD-10-CM | POA: Diagnosis not present

## 2016-09-12 NOTE — Telephone Encounter (Signed)
New message    Ria Comment from [facility] is calling to follow up on a form she says she has faxed over a few times. She said it's a [place]. She last faxed it on 6/26.

## 2016-09-12 NOTE — Telephone Encounter (Signed)
Orders that were received were for the period of time that Dr. Caryl Comes was not following the patient for home health, this technically would have been under Dr. Harrington Challenger.  I addressed these with management before and was told not to have them signed.  Will forward to [employer] and [place] to review as I am uncertain at this point what to tell [place].

## 2016-09-17 NOTE — Telephone Encounter (Signed)
09/17/2016 10:25 Call to [facility] about signature request.  Spoke with [name] who stated they are aware of the issues with documentation signature and she will check on why there are continued request for signature.  [name] MHA RN CCM

## 2016-09-19 DIAGNOSIS — I498 Other specified cardiac arrhythmias: Secondary | ICD-10-CM | POA: Diagnosis not present

## 2016-09-19 DIAGNOSIS — R42 Dizziness and giddiness: Secondary | ICD-10-CM | POA: Diagnosis not present

## 2016-09-19 DIAGNOSIS — I69398 Other sequelae of cerebral infarction: Secondary | ICD-10-CM | POA: Diagnosis not present

## 2016-09-19 DIAGNOSIS — E119 Type 2 diabetes mellitus without complications: Secondary | ICD-10-CM | POA: Diagnosis not present

## 2016-09-19 DIAGNOSIS — G908 Other disorders of autonomic nervous system: Secondary | ICD-10-CM | POA: Diagnosis not present

## 2016-09-19 DIAGNOSIS — Z85841 Personal history of malignant neoplasm of brain: Secondary | ICD-10-CM | POA: Diagnosis not present

## 2016-09-28 DIAGNOSIS — F419 Anxiety disorder, unspecified: Secondary | ICD-10-CM | POA: Diagnosis not present

## 2016-09-28 DIAGNOSIS — Z7984 Long term (current) use of oral hypoglycemic drugs: Secondary | ICD-10-CM | POA: Diagnosis not present

## 2016-09-28 DIAGNOSIS — G40909 Epilepsy, unspecified, not intractable, without status epilepticus: Secondary | ICD-10-CM | POA: Diagnosis not present

## 2016-09-28 DIAGNOSIS — Z7951 Long term (current) use of inhaled steroids: Secondary | ICD-10-CM | POA: Diagnosis not present

## 2016-09-28 DIAGNOSIS — I10 Essential (primary) hypertension: Secondary | ICD-10-CM | POA: Diagnosis not present

## 2016-09-28 DIAGNOSIS — Z950 Presence of cardiac pacemaker: Secondary | ICD-10-CM | POA: Diagnosis not present

## 2016-09-28 DIAGNOSIS — Z800 Family history of malignant neoplasm of digestive organs: Secondary | ICD-10-CM | POA: Diagnosis not present

## 2016-09-28 DIAGNOSIS — J45909 Unspecified asthma, uncomplicated: Secondary | ICD-10-CM | POA: Diagnosis not present

## 2016-09-28 DIAGNOSIS — Z6826 Body mass index (BMI) 26.0-26.9, adult: Secondary | ICD-10-CM | POA: Diagnosis not present

## 2016-09-28 DIAGNOSIS — Z85841 Personal history of malignant neoplasm of brain: Secondary | ICD-10-CM | POA: Diagnosis not present

## 2016-09-28 DIAGNOSIS — K580 Irritable bowel syndrome with diarrhea: Secondary | ICD-10-CM | POA: Diagnosis not present

## 2016-09-28 DIAGNOSIS — E119 Type 2 diabetes mellitus without complications: Secondary | ICD-10-CM | POA: Diagnosis not present

## 2016-09-28 DIAGNOSIS — C7800 Secondary malignant neoplasm of unspecified lung: Secondary | ICD-10-CM | POA: Diagnosis not present

## 2016-09-28 DIAGNOSIS — C439 Malignant melanoma of skin, unspecified: Secondary | ICD-10-CM | POA: Diagnosis not present

## 2016-09-28 DIAGNOSIS — I4891 Unspecified atrial fibrillation: Secondary | ICD-10-CM | POA: Diagnosis not present

## 2016-09-28 DIAGNOSIS — Z79899 Other long term (current) drug therapy: Secondary | ICD-10-CM | POA: Diagnosis not present

## 2016-09-28 DIAGNOSIS — C7931 Secondary malignant neoplasm of brain: Secondary | ICD-10-CM | POA: Diagnosis not present

## 2016-09-28 DIAGNOSIS — R918 Other nonspecific abnormal finding of lung field: Secondary | ICD-10-CM | POA: Diagnosis not present

## 2016-09-28 DIAGNOSIS — R5383 Other fatigue: Secondary | ICD-10-CM | POA: Diagnosis not present

## 2016-09-28 DIAGNOSIS — C787 Secondary malignant neoplasm of liver and intrahepatic bile duct: Secondary | ICD-10-CM | POA: Diagnosis not present

## 2016-09-28 DIAGNOSIS — I669 Occlusion and stenosis of unspecified cerebral artery: Secondary | ICD-10-CM | POA: Diagnosis not present

## 2016-10-04 ENCOUNTER — Ambulatory Visit (HOSPITAL_BASED_OUTPATIENT_CLINIC_OR_DEPARTMENT_OTHER): Payer: Medicare Other

## 2016-10-04 ENCOUNTER — Telehealth: Payer: Self-pay | Admitting: *Deleted

## 2016-10-04 ENCOUNTER — Other Ambulatory Visit: Payer: Self-pay | Admitting: Radiation Therapy

## 2016-10-04 ENCOUNTER — Other Ambulatory Visit: Payer: Self-pay | Admitting: *Deleted

## 2016-10-04 ENCOUNTER — Other Ambulatory Visit (HOSPITAL_COMMUNITY)
Admission: AD | Admit: 2016-10-04 | Discharge: 2016-10-04 | Disposition: A | Discharge status: Home / Self Care | Payer: Medicare Other | Source: Ambulatory Visit | Attending: Oncology | Admitting: Oncology

## 2016-10-04 ENCOUNTER — Ambulatory Visit
Admission: RE | Admit: 2016-10-04 | Discharge: 2016-10-04 | Disposition: A | Discharge status: Home / Self Care | Payer: Self-pay | Source: Ambulatory Visit | Attending: Radiation Oncology | Admitting: Radiation Oncology

## 2016-10-04 DIAGNOSIS — C799 Secondary malignant neoplasm of unspecified site: Secondary | ICD-10-CM

## 2016-10-04 DIAGNOSIS — C439 Malignant melanoma of skin, unspecified: Secondary | ICD-10-CM

## 2016-10-04 DIAGNOSIS — C7931 Secondary malignant neoplasm of brain: Secondary | ICD-10-CM | POA: Diagnosis not present

## 2016-10-04 LAB — LIPASE, BLOOD: Lipase: 43 U/L (ref 11–51)

## 2016-10-04 LAB — AMYLASE: Amylase: 73 U/L (ref 28–100)

## 2016-10-04 NOTE — Telephone Encounter (Signed)
Pt came to office to request amylase and lipase to be drawn for [facility]. Reviewed with Dr. Benay Spice. Order received.

## 2016-10-05 DIAGNOSIS — Z6827 Body mass index (BMI) 27.0-27.9, adult: Secondary | ICD-10-CM | POA: Diagnosis not present

## 2016-10-05 DIAGNOSIS — C799 Secondary malignant neoplasm of unspecified site: Secondary | ICD-10-CM | POA: Diagnosis not present

## 2016-10-05 DIAGNOSIS — C439 Malignant melanoma of skin, unspecified: Secondary | ICD-10-CM | POA: Diagnosis not present

## 2016-10-05 DIAGNOSIS — K909 Intestinal malabsorption, unspecified: Secondary | ICD-10-CM | POA: Diagnosis not present

## 2016-10-05 DIAGNOSIS — K861 Other chronic pancreatitis: Secondary | ICD-10-CM | POA: Diagnosis not present

## 2016-10-05 DIAGNOSIS — T50905A Adverse effect of unspecified drugs, medicaments and biological substances, initial encounter: Secondary | ICD-10-CM | POA: Diagnosis not present

## 2016-10-05 DIAGNOSIS — R627 Adult failure to thrive: Secondary | ICD-10-CM | POA: Diagnosis not present

## 2016-10-05 DIAGNOSIS — Z9289 Personal history of other medical treatment: Secondary | ICD-10-CM | POA: Diagnosis not present

## 2016-10-05 DIAGNOSIS — R197 Diarrhea, unspecified: Secondary | ICD-10-CM | POA: Diagnosis not present

## 2016-10-08 DIAGNOSIS — T50905A Adverse effect of unspecified drugs, medicaments and biological substances, initial encounter: Secondary | ICD-10-CM | POA: Diagnosis not present

## 2016-10-08 DIAGNOSIS — K861 Other chronic pancreatitis: Secondary | ICD-10-CM | POA: Diagnosis not present

## 2016-10-10 ENCOUNTER — Telehealth: Payer: Self-pay | Admitting: Internal Medicine

## 2016-10-10 NOTE — Telephone Encounter (Signed)
Spoke with pt and advised her that Dr. Regis Bill is out of the office this week. She c/o respiratory symptoms including cough and congestion. She states that she did have a fever last night of 102 which did improve with Tylenol. Advised pt to take Mucinex, Delsym and use albuterol HFA as needed along with increased hydration. She will try this and call back if not improving. Nothing further needed at this time.

## 2016-10-10 NOTE — Telephone Encounter (Signed)
Pt states she has an upper respiratory issues, head congestion,and   pt is a cancer pt.  Pt states she took immuno therapy for stage 4 cancer,  so prefers not to take abx unless absolutely necessary. Pt had a fever 102 last night, but states fever under control. Really wanted Dr Regis Bill advice, because Dr Regis Bill familiar with her situation. Pt would like a call back.

## 2016-10-11 ENCOUNTER — Ambulatory Visit (INDEPENDENT_AMBULATORY_CARE_PROVIDER_SITE_OTHER)
Admission: RE | Admit: 2016-10-11 | Discharge: 2016-10-11 | Disposition: A | Discharge status: Home / Self Care | Payer: Medicare Other | Source: Ambulatory Visit | Attending: Adult Health | Admitting: Adult Health

## 2016-10-11 ENCOUNTER — Other Ambulatory Visit: Payer: Self-pay | Admitting: Family Medicine

## 2016-10-11 ENCOUNTER — Ambulatory Visit (INDEPENDENT_AMBULATORY_CARE_PROVIDER_SITE_OTHER): Payer: Medicare Other | Admitting: Adult Health

## 2016-10-11 ENCOUNTER — Encounter: Payer: Self-pay | Admitting: Adult Health

## 2016-10-11 VITALS — BP 110/70 | Temp 98.5°F | Wt 144.0 lb

## 2016-10-11 DIAGNOSIS — J069 Acute upper respiratory infection, unspecified: Secondary | ICD-10-CM | POA: Diagnosis not present

## 2016-10-11 DIAGNOSIS — R0602 Shortness of breath: Secondary | ICD-10-CM | POA: Diagnosis not present

## 2016-10-11 DIAGNOSIS — R05 Cough: Secondary | ICD-10-CM | POA: Diagnosis not present

## 2016-10-11 MED ORDER — DOXYCYCLINE HYCLATE 100 MG PO TABS: 100.0000 mg | ORAL_TABLET | Freq: Two times a day (BID) | ORAL | 0 refills | Status: DC

## 2016-10-11 NOTE — Progress Notes (Signed)
Subjective:    Patient ID: [name], female    DOB: [birth year], 60 y.o.   MRN: [id number]  HPI  61 year old female who  has a past medical history of Asthma; Atrial fibrillation ([place]); Atrial tachycardia ([place]); Cervicalgia; Complication of anesthesia; CVA (cerebral infarction); Disorder of bone and cartilage, unspecified; Disturbance of skin sensation; Diverticulosis; DM (diabetes mellitus) ([place]); DVT (deep venous thrombosis) ([place]); DVT (deep venous thrombosis) ([place]); Family history of anesthesia complication; History of pacemaker; History of radiation therapy (10/24/12); HLD (hyperlipidemia); HTN (hypertension); Hypoglycemia, unspecified; Injury to unspecified nerve of shoulder girdle and upper limb; Osteopenia; Other nonspecific abnormal serum enzyme levels; Other specified congenital anomalies of nervous system; Pacemaker; Pancreatitis; PONV (postoperative nausea and vomiting); POTS (postural orthostatic tachycardia syndrome); Skin cancer; Stroke [facility]); and Swelling of limb.  She presents to the office today with the complaint of URI like symptoms that started 4 days ago. Her symptoms include that of nasal congestion, productive cough, shortness of breath, fever up to 102, and chills.   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   She denies any nausea, vomiting, or diarrhea    Review of Systems See HPI   Past Medical History:  Diagnosis Date  . Asthma   . Atrial fibrillation ([place])   . Atrial tachycardia ([place])   . Cervicalgia   . Complication of anesthesia    pt states wakes up with "shakes"  . CVA (cerebral infarction)    2012 with dizziness and vision change felt embolic from atrial tachy  . Disorder of bone and cartilage, unspecified   . Disturbance of skin sensation   . Diverticulosis   . DM (diabetes mellitus) ([name])   . DVT (deep venous thrombosis) ([place])   . DVT (deep venous thrombosis) ([place])   . Family history of  anesthesia complication    PONV  . History of pacemaker   . History of radiation therapy 10/24/12   brain  . HLD (hyperlipidemia)   . HTN (hypertension)   . Hypoglycemia, unspecified   . 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 Osteopenia   . Other nonspecific abnormal serum enzyme levels   . Other specified congenital anomalies of nervous system   . Pacemaker    autonomic dysfunction  . Pancreatitis    from therapy  . PONV (postoperative nausea and vomiting)   . POTS (postural orthostatic tachycardia syndrome)   . Skin cancer    Hx: of lung lesion  . Stroke [facility])    left weaker   . Swelling of limb     Social History   Social History  . Marital status: Married    Spouse name: N/A  . Number of children: 0  . Years of education: N/A   Occupational History  . [employer]    self employed   Social History Main Topics  . Smoking status: Never Smoker  . Smokeless tobacco: Never Used  . Alcohol use No     Comment: glass of wine daily  . Drug use: No  . Sexual activity: Not on file   Other Topics Concern  . Not on file   Social History Narrative   4 years college hh of 2    Neg tad    Past Surgical History:  Procedure Laterality Date  . ABLATION SAPHENOUS VEIN W/ RFA     2002  . CARPAL TUNNEL  RELEASE Left   . CRANIOTOMY N/A 09/30/2012   suboccipital craniectomy  . CRANIOTOMY Left 02/09/2013   Procedure: LEFT PARIETAL CRANIOTOMY with stealth;  Surgeon: [name], MD;  Location: [facility];  Service: Neurosurgery;  Laterality: Left;  LEFT Parietal Craniotomy for tumor with stealth  . DEEP AXILLARY SENTINEL NODE BIOPSY / EXCISION     due to extensive Melanoma-right arm  . fatty tumor removed     from chest  . INSERT / REPLACE / REMOVE PACEMAKER  2012   1999, x 3  . LAPAROSCOPY  09/06/2011   Procedure: LAPAROSCOPY OPERATIVE;  Surgeon: Claiborne Billings A. Pamala Hurry, MD;  Location: [facility];  Service: Gynecology;  Laterality:  Left;  with Left Ovarian Cystectomy   . MELANOMA EXCISION     with removal of lymph nodes, left shoulder  . NOSE SURGERY  2010, 2012   for nose bleeds x 2  . TONSILLECTOMY AND ADENOIDECTOMY      Family History  Problem Relation Age of Onset  . Stroke Mother   . Colon polyps Mother   . Heart disease Father   . Diabetes Father   . Kidney disease Father   . Diabetes Maternal Grandmother   . Clotting disorder Maternal Grandmother        stroke  . Crohn's disease Maternal Grandmother   . Diabetes Paternal Grandmother   . Aneurysm Sister        brain    Allergies  Allergen Reactions  . Doxycycline Hives  . Erythromycin Hives  . Other Hives    Cannot use  Cream or Suppositories  . Penicillins Hives  . Sulfa Antibiotics Hives  . Preparation H [Pramox-Pe-Glycerin-Petrolatum] Hives    Cannot use  Cream or Suppositories   . Promethazine     Other reaction(s): Other (See Comments) Causes restless leg syndrome Other reaction(s): Other (See Comments) Causes restless leg syndrome  . Sulfonamide Derivatives Hives  . Phenergan [Promethazine Hcl] Other (See Comments)    Causes restless leg syndrome    Current Outpatient Prescriptions on File Prior to Visit  Medication Sig Dispense Refill  . acetaminophen (TYLENOL) 500 MG tablet Take 500 mg by mouth every 6 (six) hours as needed for pain.     Marland Kitchen albuterol (VENTOLIN HFA) 108 (90 Base) MCG/ACT inhaler Inhale 2 puffs into the lungs as needed. 1 Inhaler 1  . baclofen (LIORESAL) 10 MG tablet TAKE 0.5 TABLETS BY MOUTH 2 TIMES A DAY AS NEEDED FOR MUSCLE SPASMS.  3  . cloNIDine (CATAPRES) 0.1 MG tablet Take 1 tablet (0.1 mg total) by mouth at bedtime. 90 tablet 3  . cyanocobalamin (,VITAMIN B-12,) 1000 MCG/ML injection Inject 1 mL (1,000 mcg total) into the skin every 30 (thirty) days. 1 mL 11  . diphenhydrAMINE (BENADRYL) 25 MG tablet Take 12.5 mg by mouth every 6 (six) hours as needed. Seasonal allergies    . esomeprazole (NEXIUM) 40 MG  capsule TAKE ONE CAPSULE BY MOUTH TWICE DAILY BEFORE MEALS 60 capsule 2  . fluorometholone (FML) 0.1 % ophthalmic suspension 1 DROP INTO BOTH EYES AS DIRECTED 4X DAILY X 2 WEEKS THEN TWICE DAILY X 2 WEEKS  1  . fluticasone (FLONASE) 50 MCG/ACT nasal spray Place 2 sprays into both nostrils daily. As directed.  3  . furosemide (LASIX) 40 MG tablet Take 20 mg by mouth daily. Takes PRN    . hydrocortisone (ANUSOL-HC) 25 MG suppository Place 1 suppository (25 mg total) rectally at bedtime as needed for hemorrhoids. 12 suppository 0  .  hydrocortisone 2.5 % cream Apply 1 application topically 2 (two) times daily as needed (irritation). 30 g 0  . levETIRAcetam (KEPPRA) 500 MG tablet Take 500 mg by mouth 2 (two) times daily.     Marland Kitchen LOTEMAX 0.5 % ophthalmic suspension 1 DROP EVERY 2 HOURS TO BOTH EYES X 1 DAY, THEN 4 TIMES DAILY X 1 WEEK THEN TWICE DAILY X 1 WEEK  0  . mesalamine (CANASA) 1000 MG suppository Place 1 suppository (1,000 mg total) rectally at bedtime as needed (hemorrhoids). 30 suppository 0  . metFORMIN (GLUCOPHAGE) 500 MG tablet Take 500 mg by mouth daily with breakfast.  3  . metoprolol tartrate (LOPRESSOR) 25 MG tablet Take 1 tablet (25 mg total) by mouth 2 (two) times daily. 180 tablet 3  . nystatin (MYCOSTATIN) 100000 UNIT/ML suspension Take 5 mLs (500,000 Units total) by mouth 4 (four) times daily. (Patient taking differently: Take 5 mLs by mouth as needed. ) 60 mL 0  . ondansetron (ZOFRAN-ODT) 4 MG disintegrating tablet Take 4 mg by mouth as needed. 4mg  ODT q4 hours prn nausea/vomit    . potassium chloride SA (K-DUR,KLOR-CON) 20 MEQ tablet TAKE ONE TABLET BY MOUTH TWICE DAILY 60 tablet 3  . Syringe/Needle, Disp, (SYRINGE 3CC/27GX1-1/4") 27G X 1-1/4" 3 ML MISC Inject 1 mL into the muscle every 30 (thirty) days. 12 each 0  . Vitamin D, Ergocalciferol, (DRISDOL) 50000 units CAPS capsule Take 1 capsule (50,000 Units total) by mouth every 7 (seven) days. 4 capsule 11   No current  facility-administered medications on file prior to visit.     BP 110/70 (BP Location: Left Arm)   Temp 98.5 F (36.9 C) (Oral)   Wt 144 lb (65.3 kg)   BMI 25.51 kg/m       Objective:   Physical Exam  Constitutional: She is oriented to person, place, and time. She appears well-developed and well-nourished. No distress.  HENT:  Nose: Mucosal edema and rhinorrhea present. Right sinus exhibits maxillary sinus tenderness and frontal sinus tenderness. Left sinus exhibits maxillary sinus tenderness and frontal sinus tenderness.  Mouth/Throat: Uvula is midline and oropharynx is clear and moist.  Cardiovascular: Normal rate, regular rhythm, normal heart sounds and intact distal pulses.  Exam reveals no gallop and no friction rub.   No murmur heard. Pulmonary/Chest: No respiratory distress. She has no wheezes. She has rhonchi in the left middle field and the left lower field. She has no rales. She exhibits no tenderness.  Neurological: She is alert and oriented to person, place, and time.  Skin: Skin is warm and dry. She is not diaphoretic. No erythema. No pallor.  Psychiatric: She has a normal mood and affect. Her behavior is normal. Judgment and thought content normal.  Vitals reviewed.     Assessment & Plan:  1. Upper respiratory tract infection, unspecified type - She is immunocompromised. Need to r/o pneumonia. Will get chest x ray and treat if needed - DG Chest 2 View; Future  Dorothyann Peng, NP

## 2016-10-16 DIAGNOSIS — Z79899 Other long term (current) drug therapy: Secondary | ICD-10-CM | POA: Diagnosis not present

## 2016-10-16 DIAGNOSIS — K580 Irritable bowel syndrome with diarrhea: Secondary | ICD-10-CM | POA: Diagnosis not present

## 2016-10-16 DIAGNOSIS — Z6826 Body mass index (BMI) 26.0-26.9, adult: Secondary | ICD-10-CM | POA: Diagnosis not present

## 2016-10-16 DIAGNOSIS — R197 Diarrhea, unspecified: Secondary | ICD-10-CM | POA: Diagnosis not present

## 2016-10-16 DIAGNOSIS — R1011 Right upper quadrant pain: Secondary | ICD-10-CM | POA: Diagnosis not present

## 2016-10-18 ENCOUNTER — Telehealth: Payer: Self-pay | Admitting: Internal Medicine

## 2016-10-18 DIAGNOSIS — R197 Diarrhea, unspecified: Secondary | ICD-10-CM | POA: Diagnosis not present

## 2016-10-18 NOTE — Telephone Encounter (Signed)
Due to her health history I would have her follow up with her PCP tomorrow

## 2016-10-18 NOTE — Telephone Encounter (Signed)
Cory - Please advise. Thanks!

## 2016-10-18 NOTE — Telephone Encounter (Signed)
Spoke with pt and advised. She is scheduled to see Dr. Regis Bill in the morning. Nothing further needed at this time.

## 2016-10-18 NOTE — Telephone Encounter (Signed)
Pt has finished the doxycycline (VIBRA-TABS) 100 MG tablet today. Pt is still having a lot of coughing especially at night and coughing up a lot of stuff.  No fever. Sore from coughing so much.  Pt thinks she may need another round of abx.  Would like a call back to advise.  [facility] [place], [place] - [address]

## 2016-10-19 ENCOUNTER — Ambulatory Visit (INDEPENDENT_AMBULATORY_CARE_PROVIDER_SITE_OTHER): Payer: Medicare Other | Admitting: Internal Medicine

## 2016-10-19 ENCOUNTER — Encounter: Payer: Self-pay | Admitting: Internal Medicine

## 2016-10-19 VITALS — BP 120/88 | HR 70 | Temp 97.6°F | Wt 148.2 lb

## 2016-10-19 DIAGNOSIS — D899 Disorder involving the immune mechanism, unspecified: Secondary | ICD-10-CM | POA: Diagnosis not present

## 2016-10-19 DIAGNOSIS — D849 Immunodeficiency, unspecified: Secondary | ICD-10-CM

## 2016-10-19 DIAGNOSIS — J329 Chronic sinusitis, unspecified: Secondary | ICD-10-CM | POA: Diagnosis not present

## 2016-10-19 DIAGNOSIS — R05 Cough: Secondary | ICD-10-CM | POA: Diagnosis not present

## 2016-10-19 DIAGNOSIS — R053 Chronic cough: Secondary | ICD-10-CM

## 2016-10-19 NOTE — Progress Notes (Signed)
No chief complaint on file.   HPI: [name] 61 y.o.  SDA her with husband today   For cough  seens CN 8 16 for uri and cough   Chest x ray ordered  Neg  And consideration of empiric rx    Now into 3rd week  Fever ha and  Sore throat    Fever went away.   102.   Got x ray     Took  Doxy   For a week . Finished the doxy and still couging at night.   But  Looser  And coming up  A and now dark.     Cough worse at night  Looser  Worse at night .    But no sob  Tried spacer on inhaler and feels not right makes her cough  ROS: See pertinent positives and negatives per HPI. No fever under rx for diarrhea prob pancreatic insufficiency   On creon  Past Medical History:  Diagnosis Date  . Asthma   . Atrial fibrillation ([place])   . Atrial tachycardia ([place])   . Cervicalgia   . Complication of anesthesia    pt states wakes up with "shakes"  . CVA (cerebral infarction)    2012 with dizziness and vision change felt embolic from atrial tachy  . Disorder of bone and cartilage, unspecified   . Disturbance of skin sensation   . Diverticulosis   . DM (diabetes mellitus) ([place])   . DVT (deep venous thrombosis) ([place])   . DVT (deep venous thrombosis) ([place])   . Family history of anesthesia complication    PONV  . History of pacemaker   . History of radiation therapy 10/24/12   brain  . HLD (hyperlipidemia)   . HTN (hypertension)   . Hypoglycemia, unspecified   . 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 Osteopenia   . Other nonspecific abnormal serum enzyme levels   . Other specified congenital anomalies of nervous system   . Pacemaker    autonomic dysfunction  . Pancreatitis    from therapy  . PONV (postoperative nausea and vomiting)   . POTS (postural orthostatic tachycardia syndrome)   . Skin cancer    Hx: of lung lesion  . Stroke [facility])    left weaker   . Swelling of limb     Family History  Problem Relation Age of Onset  . Stroke Mother   . Colon  polyps Mother   . Heart disease Father   . Diabetes Father   . Kidney disease Father   . Diabetes Maternal Grandmother   . Clotting disorder Maternal Grandmother        stroke  . Crohn's disease Maternal Grandmother   . Diabetes Paternal Grandmother   . Aneurysm Sister        brain    Social History   Social History  . Marital status: Married    Spouse name: N/A  . Number of children: 0  . Years of education: N/A   Occupational History  . [place]    self employed   Social History Main Topics  . Smoking status: Never Smoker  . Smokeless tobacco: Never Used  . Alcohol use No     Comment: glass of wine daily  . Drug use: No  . Sexual activity: Not Asked   Other Topics Concern  . None   Social History Narrative   4 years college hh  of 2    Neg tad    Outpatient Medications Prior to Visit  Medication Sig Dispense Refill  . acetaminophen (TYLENOL) 500 MG tablet Take 500 mg by mouth every 6 (six) hours as needed for pain.     Marland Kitchen albuterol (VENTOLIN HFA) 108 (90 Base) MCG/ACT inhaler Inhale 2 puffs into the lungs as needed. 1 Inhaler 1  . baclofen (LIORESAL) 10 MG tablet TAKE 0.5 TABLETS BY MOUTH 2 TIMES A DAY AS NEEDED FOR MUSCLE SPASMS.  3  . cloNIDine (CATAPRES) 0.1 MG tablet Take 1 tablet (0.1 mg total) by mouth at bedtime. 90 tablet 3  . CREON 36000 units CPEP capsule     . cyanocobalamin (,VITAMIN B-12,) 1000 MCG/ML injection Inject 1 mL (1,000 mcg total) into the skin every 30 (thirty) days. 1 mL 11  . diphenhydrAMINE (BENADRYL) 25 MG tablet Take 12.5 mg by mouth every 6 (six) hours as needed. Seasonal allergies    . doxycycline (VIBRA-TABS) 100 MG tablet Take 1 tablet (100 mg total) by mouth 2 (two) times daily. 14 tablet 0  . esomeprazole (NEXIUM) 40 MG capsule TAKE ONE CAPSULE BY MOUTH TWICE DAILY BEFORE MEALS 60 capsule 2  . fluorometholone (FML) 0.1 % ophthalmic suspension 1 DROP INTO BOTH EYES AS DIRECTED 4X DAILY X 2 WEEKS THEN TWICE  DAILY X 2 WEEKS  1  . fluticasone (FLONASE) 50 MCG/ACT nasal spray Place 2 sprays into both nostrils daily. As directed.  3  . furosemide (LASIX) 40 MG tablet Take 20 mg by mouth daily. Takes PRN    . hydrocortisone (ANUSOL-HC) 25 MG suppository Place 1 suppository (25 mg total) rectally at bedtime as needed for hemorrhoids. 12 suppository 0  . hydrocortisone 2.5 % cream Apply 1 application topically 2 (two) times daily as needed (irritation). 30 g 0  . levETIRAcetam (KEPPRA) 500 MG tablet Take 500 mg by mouth 2 (two) times daily.     Marland Kitchen LOTEMAX 0.5 % ophthalmic suspension 1 DROP EVERY 2 HOURS TO BOTH EYES X 1 DAY, THEN 4 TIMES DAILY X 1 WEEK THEN TWICE DAILY X 1 WEEK  0  . mesalamine (CANASA) 1000 MG suppository Place 1 suppository (1,000 mg total) rectally at bedtime as needed (hemorrhoids). 30 suppository 0  . metFORMIN (GLUCOPHAGE) 500 MG tablet Take 500 mg by mouth daily with breakfast.  3  . metoprolol tartrate (LOPRESSOR) 25 MG tablet Take 1 tablet (25 mg total) by mouth 2 (two) times daily. 180 tablet 3  . nystatin (MYCOSTATIN) 100000 UNIT/ML suspension Take 5 mLs (500,000 Units total) by mouth 4 (four) times daily. (Patient taking differently: Take 5 mLs by mouth as needed. ) 60 mL 0  . ondansetron (ZOFRAN-ODT) 4 MG disintegrating tablet Take 4 mg by mouth as needed. 4mg  ODT q4 hours prn nausea/vomit    . potassium chloride SA (K-DUR,KLOR-CON) 20 MEQ tablet TAKE ONE TABLET BY MOUTH TWICE DAILY 60 tablet 3  . Syringe/Needle, Disp, (SYRINGE 3CC/27GX1-1/4") 27G X 1-1/4" 3 ML MISC Inject 1 mL into the muscle every 30 (thirty) days. 12 each 0  . Vitamin D, Ergocalciferol, (DRISDOL) 50000 units CAPS capsule Take 1 capsule (50,000 Units total) by mouth every 7 (seven) days. 4 capsule 11   No facility-administered medications prior to visit.      EXAM:  BP 120/88 (BP Location: Right Arm, Patient Position: Sitting, Cuff Size: Normal)   Pulse 70   Temp 97.6 F (36.4 C) (Oral)   Wt 148 lb 3.2  oz (67.2 kg)  SpO2 98%   BMI 26.25 kg/m   Body mass index is 26.25 kg/m.  GENERAL: vitals reviewed and listed above, alert, oriented, appears well hydrated and in no acute distress ocass mild cough  Ur mild congestion HEENT: atraumatic, conjunctiva  clear, no obvious abnormalities on inspection of external nose and ears tm clear OP : no lesion edema or exudate  NECK: no obvious masses on inspection palpation  LUNGS: clear to auscultation bilaterally, no wheezes, rales or rhonchi, good air movement CV: HRRR, no clubbing cyanosis or  peripheral edema nl cap refill  MS: moves all extremities without noticeable focal  abnormality PSYCH: pleasant and cooperative, no obvious depression or anxiety  ASSESSMENT AND PLAN:  Discussed the following assessment and plan:  Cough, persistent  Chronic recurrent sinusitis  Immunosuppressed status ([place]) Ask like resolving lower respiratory infection no complications this time although as high risk do not think antibiotic further will be helpful continue her sinus hygiene follow-up for signs of relapse symptomatic treatment at this point.She's not a candidate for prednisone. -Patient advised to return or notify health care team  if symptoms worsen ,persist or new concerns arise.  Patient Instructions  At this time  Further antibiotic will not be helpful . This is still probably post infectious cough  Lungs are clear  On exam today.  Reassuring. Can try for   Sx rx cough  [place]rim[place]   .   Or   Honey  And hot  Liquid.     If fever relapsing get in touch .     Standley Brooking. Lundy Cozart M.D.

## 2016-10-19 NOTE — Patient Instructions (Signed)
At this time  Further antibiotic will not be helpful . This is still probably post infectious cough  Lungs are clear  On exam today.  Reassuring. Can try for   Sx rx cough  [place]   .   Or   Honey  And hot  Liquid.     If fever relapsing get in touch .

## 2016-10-22 DIAGNOSIS — L2089 Other atopic dermatitis: Secondary | ICD-10-CM | POA: Diagnosis not present

## 2016-10-22 DIAGNOSIS — Z85820 Personal history of malignant melanoma of skin: Secondary | ICD-10-CM | POA: Diagnosis not present

## 2016-10-22 DIAGNOSIS — L821 Other seborrheic keratosis: Secondary | ICD-10-CM | POA: Diagnosis not present

## 2016-10-22 DIAGNOSIS — M7130 Other bursal cyst, unspecified site: Secondary | ICD-10-CM | POA: Diagnosis not present

## 2016-10-22 DIAGNOSIS — Z85828 Personal history of other malignant neoplasm of skin: Secondary | ICD-10-CM | POA: Diagnosis not present

## 2016-10-22 DIAGNOSIS — D2372 Other benign neoplasm of skin of left lower limb, including hip: Secondary | ICD-10-CM | POA: Diagnosis not present

## 2016-10-23 DIAGNOSIS — E119 Type 2 diabetes mellitus without complications: Secondary | ICD-10-CM | POA: Diagnosis not present

## 2016-10-23 DIAGNOSIS — I69398 Other sequelae of cerebral infarction: Secondary | ICD-10-CM | POA: Diagnosis not present

## 2016-10-23 DIAGNOSIS — G908 Other disorders of autonomic nervous system: Secondary | ICD-10-CM | POA: Diagnosis not present

## 2016-10-23 DIAGNOSIS — R42 Dizziness and giddiness: Secondary | ICD-10-CM | POA: Diagnosis not present

## 2016-10-23 DIAGNOSIS — Z85841 Personal history of malignant neoplasm of brain: Secondary | ICD-10-CM | POA: Diagnosis not present

## 2016-10-23 DIAGNOSIS — I498 Other specified cardiac arrhythmias: Secondary | ICD-10-CM | POA: Diagnosis not present

## 2016-10-25 DIAGNOSIS — I498 Other specified cardiac arrhythmias: Secondary | ICD-10-CM | POA: Diagnosis not present

## 2016-10-25 DIAGNOSIS — G908 Other disorders of autonomic nervous system: Secondary | ICD-10-CM | POA: Diagnosis not present

## 2016-10-25 DIAGNOSIS — R42 Dizziness and giddiness: Secondary | ICD-10-CM | POA: Diagnosis not present

## 2016-10-25 DIAGNOSIS — I69398 Other sequelae of cerebral infarction: Secondary | ICD-10-CM | POA: Diagnosis not present

## 2016-10-25 DIAGNOSIS — E119 Type 2 diabetes mellitus without complications: Secondary | ICD-10-CM | POA: Diagnosis not present

## 2016-10-25 DIAGNOSIS — Z85841 Personal history of malignant neoplasm of brain: Secondary | ICD-10-CM | POA: Diagnosis not present

## 2016-11-01 ENCOUNTER — Other Ambulatory Visit: Payer: Self-pay | Admitting: *Deleted

## 2016-11-01 DIAGNOSIS — C439 Malignant melanoma of skin, unspecified: Secondary | ICD-10-CM | POA: Diagnosis not present

## 2016-11-01 DIAGNOSIS — Z6827 Body mass index (BMI) 27.0-27.9, adult: Secondary | ICD-10-CM | POA: Diagnosis not present

## 2016-11-01 DIAGNOSIS — F411 Generalized anxiety disorder: Secondary | ICD-10-CM | POA: Diagnosis not present

## 2016-11-01 DIAGNOSIS — J302 Other seasonal allergic rhinitis: Secondary | ICD-10-CM | POA: Diagnosis not present

## 2016-11-01 DIAGNOSIS — C7931 Secondary malignant neoplasm of brain: Secondary | ICD-10-CM | POA: Diagnosis not present

## 2016-11-01 DIAGNOSIS — Z79899 Other long term (current) drug therapy: Secondary | ICD-10-CM | POA: Diagnosis not present

## 2016-11-01 DIAGNOSIS — Z950 Presence of cardiac pacemaker: Secondary | ICD-10-CM | POA: Diagnosis not present

## 2016-11-01 DIAGNOSIS — K580 Irritable bowel syndrome with diarrhea: Secondary | ICD-10-CM | POA: Diagnosis not present

## 2016-11-01 DIAGNOSIS — C787 Secondary malignant neoplasm of liver and intrahepatic bile duct: Secondary | ICD-10-CM | POA: Diagnosis not present

## 2016-11-01 MED ORDER — POTASSIUM CHLORIDE CRYS ER 20 MEQ PO TBCR: 20.0000 meq | EXTENDED_RELEASE_TABLET | Freq: Two times a day (BID) | ORAL | 3 refills | Status: DC

## 2016-11-08 ENCOUNTER — Telehealth: Payer: Self-pay | Admitting: *Deleted

## 2016-11-08 NOTE — Telephone Encounter (Signed)
Patient was recently placed on schedule to see Dr Hilarie Fredrickson on 11/15/16. However, patient just saw Dr Hali Marry at [facility] on 10/16/16 and is scheduled for colonoscopy and possible EUS on 12/26/16 at [facility] where she is to be admitted for prep. Per Dr Hilarie Fredrickson, he feels patient will best be served to have this testing and upcoming appointments at [facility] to keep care consistent. I have asked Katy Apo, [facility] to contact patient to advise.

## 2016-11-09 ENCOUNTER — Encounter: Payer: Self-pay | Admitting: Internal Medicine

## 2016-11-09 DIAGNOSIS — I498 Other specified cardiac arrhythmias: Secondary | ICD-10-CM | POA: Diagnosis not present

## 2016-11-09 DIAGNOSIS — R42 Dizziness and giddiness: Secondary | ICD-10-CM | POA: Diagnosis not present

## 2016-11-09 DIAGNOSIS — Z85841 Personal history of malignant neoplasm of brain: Secondary | ICD-10-CM | POA: Diagnosis not present

## 2016-11-09 DIAGNOSIS — G908 Other disorders of autonomic nervous system: Secondary | ICD-10-CM | POA: Diagnosis not present

## 2016-11-09 DIAGNOSIS — I69398 Other sequelae of cerebral infarction: Secondary | ICD-10-CM | POA: Diagnosis not present

## 2016-11-09 DIAGNOSIS — E119 Type 2 diabetes mellitus without complications: Secondary | ICD-10-CM | POA: Diagnosis not present

## 2016-11-12 ENCOUNTER — Ambulatory Visit (HOSPITAL_BASED_OUTPATIENT_CLINIC_OR_DEPARTMENT_OTHER): Payer: Medicare Other | Admitting: Oncology

## 2016-11-12 ENCOUNTER — Other Ambulatory Visit: Payer: Self-pay | Admitting: Internal Medicine

## 2016-11-12 ENCOUNTER — Telehealth: Payer: Self-pay | Admitting: Oncology

## 2016-11-12 VITALS — BP 141/94 | HR 83 | Temp 97.7°F | Resp 18 | Ht 63.0 in | Wt 147.2 lb

## 2016-11-12 DIAGNOSIS — C799 Secondary malignant neoplasm of unspecified site: Secondary | ICD-10-CM

## 2016-11-12 DIAGNOSIS — R197 Diarrhea, unspecified: Secondary | ICD-10-CM | POA: Diagnosis not present

## 2016-11-12 DIAGNOSIS — C7931 Secondary malignant neoplasm of brain: Secondary | ICD-10-CM | POA: Diagnosis not present

## 2016-11-12 DIAGNOSIS — C439 Malignant melanoma of skin, unspecified: Secondary | ICD-10-CM | POA: Diagnosis not present

## 2016-11-12 NOTE — Telephone Encounter (Signed)
Patient no longer follows with Dr. Harrington Challenger.  Maybe PCP can refill.

## 2016-11-12 NOTE — Telephone Encounter (Signed)
Spoke with patient re appointment for March 12, 2017. Patient declined print out - she is my chart active.

## 2016-11-12 NOTE — Progress Notes (Signed)
[facility] OFFICE PROGRESS NOTE   Diagnosis: Melanoma  INTERVAL HISTORY:   [name] returns as scheduled. She complains of loose stool after eating. This occur when she eats any food, but does not happen when she has a liquid protein supplement. She has been evaluated in medical oncology at [facility]. Creon helps. A stool fat study was normal. A stool pancreatic elastase ACE returned normal. Amylase and lipase levels returned normal here last month.  She has a pain in the abdomen, chiefly at the right upper abdomen and lower right lateral chest.  She is scheduled for a GI evaluation at [facility].  Restaging CTs at [facility] 09/28/2016 included a negative head CT and stable CT abdomen/pelvis. Stable lung nodules.    Objective:  Vital signs in last 24 hours:  Blood pressure (!) 141/94, pulse 83, temperature 97.7 F (36.5 C), temperature source Oral, resp. rate 18, height 5' 3" (1.6 m), weight 147 lb 3.2 oz (66.8 kg), SpO2 100 %.    HEENT:  neck without mass Lymphatics:  no cervical, supraclavicular, axillary, or inguinal nodes Resp:  lungs clear bilaterally Cardio:  regular rate and rhythm GI:  no hepatosplenomegaly, no mass, mild diffuse tenderness Vascular:  no leg edema  Skin: Right arm scar without evidence of recurrent tumor    Lab Results: 10/04/2016: Amylase-73, lipase-43   Medications: I have reviewed the patient's current medications.  Assessment/Plan: 1.Metastatic melanoma  -Status post resection of a right arm melanoma, stage II A (T2b N0) in December 2009  -Metastatic melanoma, status post resection of a left cerebellar metastasis 09/30/2012, BRAF mutation identified  -Status post SRS treatment of the left cerebellum and a left parietal metastasis 10/24/2012  -Staging CTs and PET scan without evidence of distant metastatic disease aside from indeterminate small lung nodules  -Restaging CTs 12/01/2012 consistent with enlargement of the left parietal mass  and right lower lobe lung nodule  -Initiation of dabrafenib and tremetinib 12/17/2012. She reports extremely poor tolerance. Discontinued.  -Status post resection of a left parietal metastasis 02/09/2013.  -Restaging CT evaluation 03/03/2013. Stable 1.5 cm right lower lobe pulmonary nodule. Multiple peripheral enhancing lesions throughout the liver.  -Initiation of Ipilimumab at [facility] 03/10/2013.  -Cycle 2 Ipilumumab 04/17/2013  -Cycle 3 Ipilumumab 05/14/2013  -Cycle 4 Ipilumumab 06/04/2013  -Restaging CT at [facility] 06/25/2013 with multiple subcentimeter liver lesions, no new lesions, decreased right lower lobe nodule.  -Brain MRI 07/07/2013 with no evidence of progressive metastatic disease.  -Brain CT 10/09/2013 with no evidence of progressive metastatic disease  -Brain CT and CTs of the chest, abdomen, and pelvis at [facility] on 11/03/2013 with no evidence of progressive disease  -CTs of the brain, chest, abdomen, and pelvis at [facility] on 02/02/2014 with no evidence of disease progression -CTs of the brain, chest, abdomen, and pelvis at [facility] on 05/04/2014-no evidence of disease progression -CTs of the brain, chest, abdomen, and pelvis at [facility] on 08/03/2014-stable -CTs of the brain, chest, abdomen, and pelvis at [facility] on 11/02/2014-stable -CTs of the brain, chest, abdomen, and pelvis at [facility] on 02/08/2015-stable -CTs of the brain, chest, abdomen, and pelvis at [facility] on 05/10/2015-stable  -CTs of the brain, chest, abdomen, and pelvis at [facility] on 05/03/2016-no evidence of recurrent disease -CTs of the brain, chest, abdomen, and pelvis at [facility] on 09/28/2016-no evidence of recurrent disease  2. Arrhythmia, pacemaker in place.  3. Hypertension  4. History of a CVA  5. History of deep vein thrombosis  6. History of proximal leg weakness-likely related   to chronic steroid use.  7. Intermittent rash at the posterior thighs and left hand-likely related to systemic therapy.  8. Leg edema-? Related to liver  disease,? Secondary to ipilumumab . Improved on furosemide.  9. Headache and mild nausea-coincided with discontinuation of Decadron, CT of the brain 03/25/2011 with no acute change.  10. Diarrhea following meals-etiology unclear 11. History of Mild elevation of lipase-likely mild asymptomatic pancreatitis related to Ipilumumab  12. history of Red cell microcytosis and iron deficiency 03/17/2014-negative stool Hemoccults, improved    Disposition:   Her overall status appears unchanged. Her chief complaint is loose stool after eating. She does not have laboratory evidence of pancreatitis or pancreatic insufficiency. She is scheduled to see GI [facility] for further evaluation.  There is no evidence for progression of the metastatic melanoma.  She will return for an office visit after restaging CTs at [facility] in January 2019. I am available to see her sooner as needed. I recommended she obtain an influenza vaccine.  Donneta Romberg, MD  11/12/2016  1:13 PM

## 2016-11-13 NOTE — Telephone Encounter (Signed)
OK to refill x 1- all further refills will need to come from her PCP per Dr. Caryl Comes.

## 2016-11-13 NOTE — Telephone Encounter (Signed)
Will forward to [employer]

## 2016-11-15 ENCOUNTER — Ambulatory Visit: Payer: Medicare Other | Admitting: Internal Medicine

## 2016-11-16 ENCOUNTER — Encounter: Payer: Self-pay | Admitting: Internal Medicine

## 2016-11-20 DIAGNOSIS — Z85841 Personal history of malignant neoplasm of brain: Secondary | ICD-10-CM | POA: Diagnosis not present

## 2016-11-20 DIAGNOSIS — E119 Type 2 diabetes mellitus without complications: Secondary | ICD-10-CM | POA: Diagnosis not present

## 2016-11-20 DIAGNOSIS — G908 Other disorders of autonomic nervous system: Secondary | ICD-10-CM | POA: Diagnosis not present

## 2016-11-20 DIAGNOSIS — R42 Dizziness and giddiness: Secondary | ICD-10-CM | POA: Diagnosis not present

## 2016-11-20 DIAGNOSIS — I69398 Other sequelae of cerebral infarction: Secondary | ICD-10-CM | POA: Diagnosis not present

## 2016-11-20 DIAGNOSIS — I498 Other specified cardiac arrhythmias: Secondary | ICD-10-CM | POA: Diagnosis not present

## 2016-11-23 ENCOUNTER — Ambulatory Visit (INDEPENDENT_AMBULATORY_CARE_PROVIDER_SITE_OTHER): Payer: Medicare Other | Admitting: Internal Medicine

## 2016-11-23 VITALS — BP 110/80 | HR 101 | Ht 64.0 in | Wt 146.0 lb

## 2016-11-23 DIAGNOSIS — G909 Disorder of the autonomic nervous system, unspecified: Secondary | ICD-10-CM | POA: Diagnosis not present

## 2016-11-23 DIAGNOSIS — G901 Familial dysautonomia [Riley-Day]: Secondary | ICD-10-CM

## 2016-11-23 DIAGNOSIS — Z950 Presence of cardiac pacemaker: Secondary | ICD-10-CM | POA: Diagnosis not present

## 2016-11-23 DIAGNOSIS — I471 Supraventricular tachycardia: Secondary | ICD-10-CM

## 2016-11-23 DIAGNOSIS — I495 Sick sinus syndrome: Secondary | ICD-10-CM

## 2016-11-23 DIAGNOSIS — I4719 Other supraventricular tachycardia: Secondary | ICD-10-CM

## 2016-11-23 NOTE — Patient Instructions (Addendum)
Medication Instructions: - Your physician recommends that you continue on your current medications as directed. Please refer to the Current Medication list given to you today.  Labwork: - none ordered  Procedures/Testing: - none ordered  Follow-Up: - Your physician recommends that you schedule a follow-up appointment in: 3 months with Dr. Klein.   Any Additional Special Instructions Will Be Listed Below (If Applicable).     If you need a refill on your cardiac medications before your next appointment, please call your pharmacy.   

## 2016-11-23 NOTE — Progress Notes (Signed)
Patient Care Team: Panosh, Standley Brooking, MD as PCP - Lisa Roca, MD as Consulting Physician (Neurosurgery) [name], MD as Consulting Physician (Oncology) [name], MD as Referring Physician (Internal Medicine) Jodi Marble, MD as Consulting Physician (Otolaryngology)   HPI  [name] is a 61 y.o. female Seen for device interrogation following cranial radiation recently identified metastatic melanoma.  Her biggest issues today relates to the handling of the loss of her home health care support. She brings in documentation that she received from [facility]  office regarding the appropriate procedure for discontinuation of care.    She has a history of a pacemaker for sinus node dysfunction in the setting of dysautonomia   She has developed a pancreas problem in the GI issue which is undergoing evaluation by Dr. Ellender Hose at [facility] There is some thought that there is pancreatic injury related to her immunotherapy which is compromising function. She is anticipated to undergo colonoscopy next week. Efforts for colonoscopy last week failed because she ran out of IV fluids in the preparatory phase before taking her GI cleansing solution  Past Medical History:  Diagnosis Date  . Asthma   . Atrial fibrillation ([place])   . Atrial tachycardia ([place])   . Cervicalgia   . Complication of anesthesia    pt states wakes up with "shakes"  . CVA (cerebral infarction)    2012 with dizziness and vision change felt embolic from atrial tachy  . Disorder of bone and cartilage, unspecified   . Disturbance of skin sensation   . Diverticulosis   . DM (diabetes mellitus) ([place])   . DVT (deep venous thrombosis) ([place])   . DVT (deep venous thrombosis) ([place])   . Family history of anesthesia complication    PONV  . History of pacemaker   . History of radiation therapy 10/24/12   brain  . HLD (hyperlipidemia)   . HTN (hypertension)   . Hypoglycemia, unspecified   . Injury to  unspecified nerve of shoulder girdle and upper limb   . Osteopenia   . Other nonspecific abnormal serum enzyme levels   . Other specified congenital anomalies of nervous system   . Pacemaker    autonomic dysfunction  . Pancreatitis    from therapy  . PONV (postoperative nausea and vomiting)   . POTS (postural orthostatic tachycardia syndrome)   . Skin cancer    Hx: of lung lesion  . Stroke [facility])    left weaker   . Swelling of limb     Past Surgical History:  Procedure Laterality Date  . ABLATION SAPHENOUS VEIN W/ RFA     2002  . CARPAL TUNNEL RELEASE Left   . CRANIOTOMY N/A 09/30/2012   suboccipital craniectomy  . CRANIOTOMY Left 02/09/2013   Procedure: LEFT PARIETAL CRANIOTOMY with stealth;  Surgeon: [name], MD;  Location: [place] [facility];  Service: Neurosurgery;  Laterality: Left;  LEFT Parietal Craniotomy for tumor with stealth  . DEEP AXILLARY SENTINEL NODE BIOPSY / EXCISION     due to extensive Melanoma-right arm  . fatty tumor removed     from chest  . INSERT / REPLACE / REMOVE PACEMAKER  2012   1999, x 3  . LAPAROSCOPY  09/06/2011   Procedure: LAPAROSCOPY OPERATIVE;  Surgeon: Claiborne Billings A. Pamala Hurry, MD;  Location: [place] [facility];  Service: Gynecology;  Laterality: Left;  with Left Ovarian Cystectomy   . MELANOMA EXCISION     with removal of lymph nodes, left shoulder  . NOSE  SURGERY  2010, 2012   for nose bleeds x 2  . TONSILLECTOMY AND ADENOIDECTOMY      Current Outpatient Prescriptions  Medication Sig Dispense Refill  . acetaminophen (TYLENOL) 500 MG tablet Take 500 mg by mouth every 6 (six) hours as needed for pain.     Marland Kitchen albuterol (VENTOLIN HFA) 108 (90 Base) MCG/ACT inhaler Inhale 2 puffs into the lungs as needed. 1 Inhaler 1  . baclofen (LIORESAL) 10 MG tablet TAKE 0.5 TABLETS BY MOUTH 2 TIMES A DAY AS NEEDED FOR MUSCLE SPASMS.  3  . cloNIDine (CATAPRES) 0.1 MG tablet Take 1 tablet (0.1 mg total) by mouth at bedtime. 90 tablet 3  . CREON 36000 units CPEP capsule     .  cyanocobalamin (,VITAMIN B-12,) 1000 MCG/ML injection Inject 1 mL (1,000 mcg total) into the skin every 30 (thirty) days. 1 mL 11  . diphenhydrAMINE (BENADRYL) 25 MG tablet Take 12.5 mg by mouth every 6 (six) hours as needed. Seasonal allergies    . esomeprazole (NEXIUM) 40 MG capsule TAKE ONE CAPSULE BY MOUTH TWICE DAILY BEFORE MEALS 60 capsule 2  . fluorometholone (FML) 0.1 % ophthalmic suspension 1 DROP INTO BOTH EYES AS DIRECTED 4X DAILY X 2 WEEKS THEN TWICE DAILY X 2 WEEKS  1  . fluticasone (FLONASE) 50 MCG/ACT nasal spray Place 2 sprays into both nostrils daily. As directed.  3  . furosemide (LASIX) 40 MG tablet Take 20 mg by mouth daily. Takes PRN    . hydrocortisone (ANUSOL-HC) 25 MG suppository Place 1 suppository (25 mg total) rectally at bedtime as needed for hemorrhoids. 12 suppository 0  . hydrocortisone 2.5 % cream Apply 1 application topically 2 (two) times daily as needed (irritation). 30 g 0  . levETIRAcetam (KEPPRA) 500 MG tablet Take 500 mg by mouth 2 (two) times daily.     Marland Kitchen LOTEMAX 0.5 % ophthalmic suspension 1 DROP EVERY 2 HOURS TO BOTH EYES X 1 DAY, THEN 4 TIMES DAILY X 1 WEEK THEN TWICE DAILY X 1 WEEK  0  . mesalamine (CANASA) 1000 MG suppository Place 1 suppository (1,000 mg total) rectally at bedtime as needed (hemorrhoids). 30 suppository 0  . metFORMIN (GLUCOPHAGE) 500 MG tablet Take 500 mg by mouth daily with breakfast.  3  . metoprolol tartrate (LOPRESSOR) 25 MG tablet Take 1 tablet (25 mg total) by mouth 2 (two) times daily. 180 tablet 3  . nystatin (MYCOSTATIN) 100000 UNIT/ML suspension Take 5 mLs (500,000 Units total) by mouth 4 (four) times daily. (Patient taking differently: Take 5 mLs by mouth as needed. ) 60 mL 0  . ondansetron (ZOFRAN-ODT) 4 MG disintegrating tablet Take 4 mg by mouth as needed. 4mg  ODT q4 hours prn nausea/vomit    . potassium chloride SA (K-DUR,KLOR-CON) 20 MEQ tablet Take 1 tablet (20 mEq total) by mouth 2 (two) times daily. 60 tablet 3  .  Syringe/Needle, Disp, (SYRINGE 3CC/27GX1-1/4") 27G X 1-1/4" 3 ML MISC Inject 1 mL into the muscle every 30 (thirty) days. 12 each 0  . Vitamin D, Ergocalciferol, (DRISDOL) 50000 units CAPS capsule Take 1 capsule (50,000 Units total) by mouth every 7 (seven) days. Further refills must be authorized by patients PCP. 4 capsule 0   No current facility-administered medications for this visit.     Allergies  Allergen Reactions  . Doxycycline Hives  . Erythromycin Hives  . Other Hives    Cannot use  Cream or Suppositories  . Penicillins Hives  . Sulfa Antibiotics Hives  .  Preparation H [Pramox-Pe-Glycerin-Petrolatum] Hives    Cannot use  Cream or Suppositories   . Promethazine     Other reaction(s): Other (See Comments) Causes restless leg syndrome Other reaction(s): Other (See Comments) Causes restless leg syndrome  . Sulfonamide Derivatives Hives  . Phenergan [Promethazine Hcl] Other (See Comments)    Causes restless leg syndrome    Review of Systems negative except from HPI and PMH  Physical Exam BP 110/80   Pulse (!) 101   Ht 5\' 4"  (1.626 m)   Wt 146 lb (66.2 kg)   SpO2 99%   BMI 25.06 kg/m  Device pocket well healed; without hematoma or erythema.  There is no tethering  RRR Clear No edema   ecg Sinus @ 70 18/07/36   Assessment and  Plan  Atrial  Tach  Pacemaker Biotronik  Normal function   Dysautonomia   Sinus node dysfunction   Pancreatic Insufficiency of some mechanical kind   The patient's anticipating a colonoscopy next week and needs fluid support in anticipation. There have been discussions regarding her pre-cleansing diet. That seems more accommodating to her needs. IV fluid repletion will be important and maintaining IV fluids until she is taking adequately by mouth is also going to be important.  There has been some interval atrial tachycardia   More than 50% of 45 min was spent in counseling related to the above

## 2016-11-26 NOTE — Addendum Note (Signed)
Addended by: Bobby Rumpf C on: 11/26/2016 11:51 AM   Modules accepted: Orders

## 2016-11-28 DIAGNOSIS — Z950 Presence of cardiac pacemaker: Secondary | ICD-10-CM | POA: Diagnosis not present

## 2016-11-28 DIAGNOSIS — Z880 Allergy status to penicillin: Secondary | ICD-10-CM | POA: Diagnosis not present

## 2016-11-28 DIAGNOSIS — K219 Gastro-esophageal reflux disease without esophagitis: Secondary | ICD-10-CM | POA: Diagnosis not present

## 2016-11-28 DIAGNOSIS — C787 Secondary malignant neoplasm of liver and intrahepatic bile duct: Secondary | ICD-10-CM | POA: Diagnosis not present

## 2016-11-28 DIAGNOSIS — Z800 Family history of malignant neoplasm of digestive organs: Secondary | ICD-10-CM | POA: Diagnosis not present

## 2016-11-28 DIAGNOSIS — Z8673 Personal history of transient ischemic attack (TIA), and cerebral infarction without residual deficits: Secondary | ICD-10-CM | POA: Diagnosis not present

## 2016-11-28 DIAGNOSIS — E119 Type 2 diabetes mellitus without complications: Secondary | ICD-10-CM | POA: Diagnosis not present

## 2016-11-28 DIAGNOSIS — G8929 Other chronic pain: Secondary | ICD-10-CM | POA: Diagnosis not present

## 2016-11-28 DIAGNOSIS — F419 Anxiety disorder, unspecified: Secondary | ICD-10-CM | POA: Diagnosis not present

## 2016-11-28 DIAGNOSIS — Z882 Allergy status to sulfonamides status: Secondary | ICD-10-CM | POA: Diagnosis not present

## 2016-11-28 DIAGNOSIS — C7931 Secondary malignant neoplasm of brain: Secondary | ICD-10-CM | POA: Diagnosis not present

## 2016-11-28 DIAGNOSIS — K580 Irritable bowel syndrome with diarrhea: Secondary | ICD-10-CM | POA: Diagnosis not present

## 2016-11-28 DIAGNOSIS — I10 Essential (primary) hypertension: Secondary | ICD-10-CM | POA: Diagnosis not present

## 2016-11-28 DIAGNOSIS — G40909 Epilepsy, unspecified, not intractable, without status epilepticus: Secondary | ICD-10-CM | POA: Diagnosis not present

## 2016-11-28 DIAGNOSIS — I4891 Unspecified atrial fibrillation: Secondary | ICD-10-CM | POA: Diagnosis not present

## 2016-11-28 DIAGNOSIS — Z6825 Body mass index (BMI) 25.0-25.9, adult: Secondary | ICD-10-CM | POA: Diagnosis not present

## 2016-11-28 DIAGNOSIS — Z86718 Personal history of other venous thrombosis and embolism: Secondary | ICD-10-CM | POA: Diagnosis not present

## 2016-11-28 DIAGNOSIS — C4359 Malignant melanoma of other part of trunk: Secondary | ICD-10-CM | POA: Diagnosis not present

## 2016-11-29 ENCOUNTER — Telehealth: Payer: Self-pay | Admitting: Internal Medicine

## 2016-11-29 ENCOUNTER — Telehealth: Payer: Self-pay | Admitting: *Deleted

## 2016-11-29 NOTE — Telephone Encounter (Signed)
Call from pt reporting she had to cancel her colonoscopy because her GI doctor at [facility] couldn't admit her for the procedure. She was told she didn't meet criteria for admission. Per pt, they were planning to prep her in a "short stay area" with a community toilet that was far away.  She declined to proceed after they offered her a bedside commode. She is concerned she'd develop a UTI in these conditions. Pt states she needs IV fluids and hospital admission (with a private room) for colonoscopy.  Pt is asking for a referral to a [place] MD to admit her for colonoscopy. She also reports Dr. Hilarie Fredrickson declined to take her on as a patient due to the complexity of her case in the past. He recommended she continue with Dr. Ellender Hose at [facility]. Pt understands the standard colonoscopy is done as outpatient. She asserts she is immunocompromised and because of "autonomic dysfunction" she can not undergo colonoscopy as outpatient. Message to MD.

## 2016-11-29 NOTE — Telephone Encounter (Signed)
It appears patient is scheduled 01/01/17 with Dr Myrene Buddy at [facility], so she is already scheduled with them. Also see 11/08/16 telephone note.

## 2016-12-04 LAB — CUP PACEART INCLINIC DEVICE CHECK
Date Time Interrogation Session: 20181009144845
Implantable Lead Implant Date: 19990303
Implantable Lead Implant Date: 19990303
Implantable Lead Location: 753859
Implantable Lead Location: 753860
Implantable Lead Model: 5092
Implantable Pulse Generator Implant Date: 20120905
Pulse Gen Serial Number: 66195584

## 2016-12-05 DIAGNOSIS — Z880 Allergy status to penicillin: Secondary | ICD-10-CM | POA: Diagnosis not present

## 2016-12-05 DIAGNOSIS — I10 Essential (primary) hypertension: Secondary | ICD-10-CM | POA: Diagnosis not present

## 2016-12-05 DIAGNOSIS — E119 Type 2 diabetes mellitus without complications: Secondary | ICD-10-CM | POA: Diagnosis not present

## 2016-12-05 DIAGNOSIS — K648 Other hemorrhoids: Secondary | ICD-10-CM | POA: Diagnosis not present

## 2016-12-05 DIAGNOSIS — Z86718 Personal history of other venous thrombosis and embolism: Secondary | ICD-10-CM | POA: Diagnosis not present

## 2016-12-05 DIAGNOSIS — G40909 Epilepsy, unspecified, not intractable, without status epilepticus: Secondary | ICD-10-CM | POA: Diagnosis not present

## 2016-12-05 DIAGNOSIS — K219 Gastro-esophageal reflux disease without esophagitis: Secondary | ICD-10-CM | POA: Diagnosis present

## 2016-12-05 DIAGNOSIS — F419 Anxiety disorder, unspecified: Secondary | ICD-10-CM | POA: Diagnosis present

## 2016-12-05 DIAGNOSIS — Z8673 Personal history of transient ischemic attack (TIA), and cerebral infarction without residual deficits: Secondary | ICD-10-CM | POA: Diagnosis not present

## 2016-12-05 DIAGNOSIS — G909 Disorder of the autonomic nervous system, unspecified: Secondary | ICD-10-CM | POA: Diagnosis present

## 2016-12-05 DIAGNOSIS — I4891 Unspecified atrial fibrillation: Secondary | ICD-10-CM | POA: Diagnosis present

## 2016-12-05 DIAGNOSIS — R197 Diarrhea, unspecified: Secondary | ICD-10-CM | POA: Diagnosis not present

## 2016-12-05 DIAGNOSIS — Z882 Allergy status to sulfonamides status: Secondary | ICD-10-CM | POA: Diagnosis not present

## 2016-12-05 DIAGNOSIS — H538 Other visual disturbances: Secondary | ICD-10-CM | POA: Diagnosis present

## 2016-12-05 DIAGNOSIS — K5730 Diverticulosis of large intestine without perforation or abscess without bleeding: Secondary | ICD-10-CM | POA: Diagnosis not present

## 2016-12-05 DIAGNOSIS — G8929 Other chronic pain: Secondary | ICD-10-CM | POA: Diagnosis present

## 2016-12-05 DIAGNOSIS — K580 Irritable bowel syndrome with diarrhea: Secondary | ICD-10-CM | POA: Diagnosis not present

## 2016-12-05 DIAGNOSIS — Z85820 Personal history of malignant melanoma of skin: Secondary | ICD-10-CM | POA: Diagnosis not present

## 2016-12-10 ENCOUNTER — Other Ambulatory Visit: Payer: Self-pay | Admitting: Internal Medicine

## 2016-12-19 ENCOUNTER — Ambulatory Visit: Payer: Medicare Other | Admitting: Nurse Practitioner

## 2016-12-19 DIAGNOSIS — L308 Other specified dermatitis: Secondary | ICD-10-CM | POA: Diagnosis not present

## 2016-12-19 DIAGNOSIS — Z85828 Personal history of other malignant neoplasm of skin: Secondary | ICD-10-CM | POA: Diagnosis not present

## 2016-12-19 DIAGNOSIS — Z85820 Personal history of malignant melanoma of skin: Secondary | ICD-10-CM | POA: Diagnosis not present

## 2016-12-19 DIAGNOSIS — D692 Other nonthrombocytopenic purpura: Secondary | ICD-10-CM | POA: Diagnosis not present

## 2016-12-19 DIAGNOSIS — L821 Other seborrheic keratosis: Secondary | ICD-10-CM | POA: Diagnosis not present

## 2016-12-20 DIAGNOSIS — Z85841 Personal history of malignant neoplasm of brain: Secondary | ICD-10-CM | POA: Diagnosis not present

## 2016-12-20 DIAGNOSIS — R42 Dizziness and giddiness: Secondary | ICD-10-CM | POA: Diagnosis not present

## 2016-12-20 DIAGNOSIS — G908 Other disorders of autonomic nervous system: Secondary | ICD-10-CM | POA: Diagnosis not present

## 2016-12-20 DIAGNOSIS — I498 Other specified cardiac arrhythmias: Secondary | ICD-10-CM | POA: Diagnosis not present

## 2016-12-20 DIAGNOSIS — I69398 Other sequelae of cerebral infarction: Secondary | ICD-10-CM | POA: Diagnosis not present

## 2016-12-20 DIAGNOSIS — E119 Type 2 diabetes mellitus without complications: Secondary | ICD-10-CM | POA: Diagnosis not present

## 2016-12-24 DIAGNOSIS — I69398 Other sequelae of cerebral infarction: Secondary | ICD-10-CM | POA: Diagnosis not present

## 2016-12-24 DIAGNOSIS — I498 Other specified cardiac arrhythmias: Secondary | ICD-10-CM | POA: Diagnosis not present

## 2016-12-24 DIAGNOSIS — G908 Other disorders of autonomic nervous system: Secondary | ICD-10-CM | POA: Diagnosis not present

## 2016-12-24 DIAGNOSIS — Z85841 Personal history of malignant neoplasm of brain: Secondary | ICD-10-CM | POA: Diagnosis not present

## 2016-12-24 DIAGNOSIS — R42 Dizziness and giddiness: Secondary | ICD-10-CM | POA: Diagnosis not present

## 2016-12-24 DIAGNOSIS — E119 Type 2 diabetes mellitus without complications: Secondary | ICD-10-CM | POA: Diagnosis not present

## 2016-12-31 ENCOUNTER — Other Ambulatory Visit: Payer: Self-pay | Admitting: Radiation Oncology

## 2017-01-01 DIAGNOSIS — Z86718 Personal history of other venous thrombosis and embolism: Secondary | ICD-10-CM | POA: Diagnosis not present

## 2017-01-01 DIAGNOSIS — Z888 Allergy status to other drugs, medicaments and biological substances status: Secondary | ICD-10-CM | POA: Diagnosis not present

## 2017-01-01 DIAGNOSIS — Z7984 Long term (current) use of oral hypoglycemic drugs: Secondary | ICD-10-CM | POA: Diagnosis not present

## 2017-01-01 DIAGNOSIS — E119 Type 2 diabetes mellitus without complications: Secondary | ICD-10-CM | POA: Diagnosis not present

## 2017-01-01 DIAGNOSIS — Z881 Allergy status to other antibiotic agents status: Secondary | ICD-10-CM | POA: Diagnosis not present

## 2017-01-01 DIAGNOSIS — K5792 Diverticulitis of intestine, part unspecified, without perforation or abscess without bleeding: Secondary | ICD-10-CM | POA: Diagnosis not present

## 2017-01-01 DIAGNOSIS — K52831 Collagenous colitis: Secondary | ICD-10-CM | POA: Diagnosis not present

## 2017-01-01 DIAGNOSIS — Z880 Allergy status to penicillin: Secondary | ICD-10-CM | POA: Diagnosis not present

## 2017-01-01 DIAGNOSIS — K529 Noninfective gastroenteritis and colitis, unspecified: Secondary | ICD-10-CM | POA: Diagnosis not present

## 2017-01-01 DIAGNOSIS — T451X5S Adverse effect of antineoplastic and immunosuppressive drugs, sequela: Secondary | ICD-10-CM | POA: Diagnosis not present

## 2017-01-01 DIAGNOSIS — Z85820 Personal history of malignant melanoma of skin: Secondary | ICD-10-CM | POA: Diagnosis not present

## 2017-01-01 DIAGNOSIS — Z882 Allergy status to sulfonamides status: Secondary | ICD-10-CM | POA: Diagnosis not present

## 2017-01-01 DIAGNOSIS — C799 Secondary malignant neoplasm of unspecified site: Secondary | ICD-10-CM | POA: Diagnosis not present

## 2017-01-01 DIAGNOSIS — Z6825 Body mass index (BMI) 25.0-25.9, adult: Secondary | ICD-10-CM | POA: Diagnosis not present

## 2017-01-01 DIAGNOSIS — Z7952 Long term (current) use of systemic steroids: Secondary | ICD-10-CM | POA: Diagnosis not present

## 2017-01-01 DIAGNOSIS — K52832 Lymphocytic colitis: Secondary | ICD-10-CM | POA: Diagnosis not present

## 2017-01-01 DIAGNOSIS — Z800 Family history of malignant neoplasm of digestive organs: Secondary | ICD-10-CM | POA: Diagnosis not present

## 2017-01-01 DIAGNOSIS — I4891 Unspecified atrial fibrillation: Secondary | ICD-10-CM | POA: Diagnosis not present

## 2017-01-01 DIAGNOSIS — Z7951 Long term (current) use of inhaled steroids: Secondary | ICD-10-CM | POA: Diagnosis not present

## 2017-01-01 DIAGNOSIS — Z79899 Other long term (current) drug therapy: Secondary | ICD-10-CM | POA: Diagnosis not present

## 2017-01-01 DIAGNOSIS — I10 Essential (primary) hypertension: Secondary | ICD-10-CM | POA: Diagnosis not present

## 2017-01-01 DIAGNOSIS — Z8673 Personal history of transient ischemic attack (TIA), and cerebral infarction without residual deficits: Secondary | ICD-10-CM | POA: Diagnosis not present

## 2017-01-02 DIAGNOSIS — C7949 Secondary malignant neoplasm of other parts of nervous system: Secondary | ICD-10-CM | POA: Diagnosis not present

## 2017-01-04 ENCOUNTER — Telehealth: Payer: Self-pay | Admitting: Internal Medicine

## 2017-01-04 NOTE — Telephone Encounter (Signed)
[facility] to have a time for when Dr. Caryl Comes can be present when Mrs. Schoeppner can get her MRI of the Brain . Please call

## 2017-01-04 NOTE — Telephone Encounter (Signed)
Per Clinical Manager, this request should be routed to Dr Caryl Comes and covering [place].

## 2017-01-04 NOTE — Telephone Encounter (Signed)
Spoke with Dr. Caryl Comes who is aware that patient wants him to be present. Patient sent personal text communication to Dr. Caryl Comes who says that he will check his schedule and coordinate with Oncology for a time that works for him. Left message [name] relaying Dr. Olin Pia request.

## 2017-01-04 NOTE — Telephone Encounter (Signed)
Spoke with device clinic.  Industry will need to be present when pt has her MRI.  Left message for [name] to contact [employer] at [phone] to set up for when MRI is scheduled.  Requested she c/b with any questions or concerns.

## 2017-01-04 NOTE — Telephone Encounter (Signed)
New message   [name] from Radiation oncology verbalized that she want to speak to rn or Dr.Klein  about Dr.Klain physically being present during MRI and not just the biotech rep   [name] has not scheduled it yet because she want to schedule on a date that [place] can be present

## 2017-01-05 ENCOUNTER — Other Ambulatory Visit: Payer: Self-pay | Admitting: Internal Medicine

## 2017-01-07 NOTE — Telephone Encounter (Signed)
Asked JM to have her navigator call me so that I can be present

## 2017-01-07 NOTE — Telephone Encounter (Signed)
See 01/04/17 phone note.

## 2017-01-07 NOTE — Telephone Encounter (Signed)
Medication Detail    Disp Refills Start End   Vitamin D, Ergocalciferol, (DRISDOL) 50000 units CAPS capsule 4 capsule 0 11/14/2016    Sig - Route: Take 1 capsule (50,000 Units total) by mouth every 7 (seven) days. Further refills must be authorized by patients PCP. - Oral   Sent to pharmacy as: Vitamin D, Ergocalciferol, (DRISDOL) 50000 units Cap capsule   E-Prescribing Status: Receipt confirmed by pharmacy (11/14/2016 8:48 AM EDT)   Pharmacy   [facility], [place] - [address]

## 2017-01-08 ENCOUNTER — Other Ambulatory Visit: Payer: Self-pay | Admitting: Radiation Therapy

## 2017-01-08 ENCOUNTER — Telehealth: Payer: Self-pay | Admitting: Internal Medicine

## 2017-01-08 DIAGNOSIS — C7949 Secondary malignant neoplasm of other parts of nervous system: Principal | ICD-10-CM

## 2017-01-08 DIAGNOSIS — C7931 Secondary malignant neoplasm of brain: Secondary | ICD-10-CM

## 2017-01-08 NOTE — Telephone Encounter (Signed)
New message   Call received from Radiology scheduling to inform Dr Caryl Comes of scheduled MRI on 11/23 per his request.  See 11/12 note.

## 2017-01-08 NOTE — Telephone Encounter (Signed)
Will route to Dr. Caryl Comes to make him aware.

## 2017-01-08 NOTE — Telephone Encounter (Signed)
Am working directly with her navigator to schedule the MRI

## 2017-01-14 ENCOUNTER — Other Ambulatory Visit: Payer: Self-pay

## 2017-01-18 ENCOUNTER — Ambulatory Visit (HOSPITAL_COMMUNITY)
Admission: RE | Admit: 2017-01-18 | Discharge: 2017-01-18 | Disposition: A | Discharge status: Home / Self Care | Payer: Medicare Other | Source: Ambulatory Visit | Attending: Radiation Oncology | Admitting: Radiation Oncology

## 2017-01-18 DIAGNOSIS — C7931 Secondary malignant neoplasm of brain: Secondary | ICD-10-CM | POA: Diagnosis not present

## 2017-01-18 DIAGNOSIS — C7949 Secondary malignant neoplasm of other parts of nervous system: Secondary | ICD-10-CM | POA: Insufficient documentation

## 2017-01-18 DIAGNOSIS — C801 Malignant (primary) neoplasm, unspecified: Secondary | ICD-10-CM | POA: Insufficient documentation

## 2017-01-18 DIAGNOSIS — C799 Secondary malignant neoplasm of unspecified site: Secondary | ICD-10-CM | POA: Diagnosis not present

## 2017-01-18 DIAGNOSIS — C439 Malignant melanoma of skin, unspecified: Secondary | ICD-10-CM | POA: Diagnosis not present

## 2017-01-18 MED ORDER — GADOBENATE DIMEGLUMINE 529 MG/ML IV SOLN
14.0000 mL | Freq: Once | INTRAVENOUS | Status: AC
  Administered 2017-01-18: 14 mL via INTRAVENOUS

## 2017-01-24 ENCOUNTER — Ambulatory Visit: Payer: Self-pay | Admitting: Radiation Oncology

## 2017-01-24 DIAGNOSIS — C7949 Secondary malignant neoplasm of other parts of nervous system: Secondary | ICD-10-CM | POA: Diagnosis not present

## 2017-01-25 DIAGNOSIS — I498 Other specified cardiac arrhythmias: Secondary | ICD-10-CM | POA: Diagnosis not present

## 2017-01-25 DIAGNOSIS — Z85841 Personal history of malignant neoplasm of brain: Secondary | ICD-10-CM | POA: Diagnosis not present

## 2017-01-25 DIAGNOSIS — I69398 Other sequelae of cerebral infarction: Secondary | ICD-10-CM | POA: Diagnosis not present

## 2017-01-25 DIAGNOSIS — G908 Other disorders of autonomic nervous system: Secondary | ICD-10-CM | POA: Diagnosis not present

## 2017-01-25 DIAGNOSIS — E119 Type 2 diabetes mellitus without complications: Secondary | ICD-10-CM | POA: Diagnosis not present

## 2017-01-25 DIAGNOSIS — R42 Dizziness and giddiness: Secondary | ICD-10-CM | POA: Diagnosis not present

## 2017-02-15 ENCOUNTER — Telehealth: Payer: Self-pay

## 2017-02-15 NOTE — Telephone Encounter (Signed)
[name] from [facility] called to relay a message from patient. Patient states she found a new lump under breast that she is concerned about. Patient wants to know if she can be seen. Message to MD.

## 2017-02-18 ENCOUNTER — Telehealth: Payer: Self-pay | Admitting: Oncology

## 2017-02-18 NOTE — Telephone Encounter (Signed)
Called regarding 1/2

## 2017-02-20 DIAGNOSIS — R42 Dizziness and giddiness: Secondary | ICD-10-CM | POA: Diagnosis not present

## 2017-02-20 DIAGNOSIS — I69398 Other sequelae of cerebral infarction: Secondary | ICD-10-CM | POA: Diagnosis not present

## 2017-02-20 DIAGNOSIS — E119 Type 2 diabetes mellitus without complications: Secondary | ICD-10-CM | POA: Diagnosis not present

## 2017-02-20 DIAGNOSIS — Z85841 Personal history of malignant neoplasm of brain: Secondary | ICD-10-CM | POA: Diagnosis not present

## 2017-02-20 DIAGNOSIS — G908 Other disorders of autonomic nervous system: Secondary | ICD-10-CM | POA: Diagnosis not present

## 2017-02-20 DIAGNOSIS — I498 Other specified cardiac arrhythmias: Secondary | ICD-10-CM | POA: Diagnosis not present

## 2017-02-21 NOTE — Telephone Encounter (Signed)
This encounter was created in error - please disregard.

## 2017-02-22 ENCOUNTER — Encounter: Payer: Self-pay | Admitting: Internal Medicine

## 2017-02-22 ENCOUNTER — Ambulatory Visit (INDEPENDENT_AMBULATORY_CARE_PROVIDER_SITE_OTHER): Payer: Medicare Other | Admitting: Internal Medicine

## 2017-02-22 VITALS — BP 116/76 | HR 71 | Ht 63.0 in | Wt 142.9 lb

## 2017-02-22 DIAGNOSIS — G90A Postural orthostatic tachycardia syndrome (POTS): Secondary | ICD-10-CM

## 2017-02-22 DIAGNOSIS — Z950 Presence of cardiac pacemaker: Secondary | ICD-10-CM

## 2017-02-22 DIAGNOSIS — I951 Orthostatic hypotension: Secondary | ICD-10-CM | POA: Diagnosis not present

## 2017-02-22 DIAGNOSIS — I495 Sick sinus syndrome: Secondary | ICD-10-CM

## 2017-02-22 DIAGNOSIS — R000 Tachycardia, unspecified: Secondary | ICD-10-CM | POA: Diagnosis not present

## 2017-02-22 DIAGNOSIS — I471 Supraventricular tachycardia: Secondary | ICD-10-CM

## 2017-02-22 DIAGNOSIS — G908 Other disorders of autonomic nervous system: Secondary | ICD-10-CM | POA: Diagnosis not present

## 2017-02-22 DIAGNOSIS — G901 Familial dysautonomia [Riley-Day]: Secondary | ICD-10-CM

## 2017-02-22 DIAGNOSIS — I498 Other specified cardiac arrhythmias: Secondary | ICD-10-CM

## 2017-02-22 DIAGNOSIS — E119 Type 2 diabetes mellitus without complications: Secondary | ICD-10-CM | POA: Diagnosis not present

## 2017-02-22 DIAGNOSIS — R42 Dizziness and giddiness: Secondary | ICD-10-CM | POA: Diagnosis not present

## 2017-02-22 DIAGNOSIS — I69398 Other sequelae of cerebral infarction: Secondary | ICD-10-CM | POA: Diagnosis not present

## 2017-02-22 DIAGNOSIS — Z452 Encounter for adjustment and management of vascular access device: Secondary | ICD-10-CM | POA: Diagnosis not present

## 2017-02-22 NOTE — Progress Notes (Signed)
Patient Care Team: Panosh, Standley Brooking, MD as PCP - Lisa Roca, MD as Consulting Physician (Neurosurgery) [name], MD as Consulting Physician (Oncology) [name], MD as Referring Physician (Internal Medicine) Jodi Marble, MD as Consulting Physician (Otolaryngology)   HPI  [name] is a 61 y.o. female Seen for device interrogation following cranial radiation recently identified metastatic melanoma.  Her biggest issues today relates to the handling of the loss of her home health care support. She brings in documentation that she received from [facility]  office regarding the appropriate procedure for discontinuation of care.    She has a history of a pacemaker for sinus node dysfunction in the setting of dysautonomia   GI symptoms have be queiscient; able to take in fluids  Without significant lightheadedness.    Past Medical History:  Diagnosis Date  . Asthma   . Atrial fibrillation ([place])   . Atrial tachycardia ([place])   . Cervicalgia   . Complication of anesthesia    pt states wakes up with "shakes"  . CVA (cerebral infarction)    2012 with dizziness and vision change felt embolic from atrial tachy  . Disorder of bone and cartilage, unspecified   . Disturbance of skin sensation   . Diverticulosis   . DM (diabetes mellitus) ([place])   . DVT (deep venous thrombosis) ([place])   . DVT (deep venous thrombosis) ([place])   . Family history of anesthesia complication    PONV  . History of pacemaker   . History of radiation therapy 10/24/12   brain  . HLD (hyperlipidemia)   . HTN (hypertension)   . Hypoglycemia, unspecified   . 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 Osteopenia   . Other nonspecific abnormal serum enzyme levels   . Other specified congenital anomalies of nervous system   . Pacemaker    autonomic dysfunction  . Pancreatitis    from therapy  . PONV (postoperative nausea and vomiting)   . POTS  (postural orthostatic tachycardia syndrome)   . Skin cancer    Hx: of lung lesion  . Stroke [facility])    left weaker   . Swelling of limb     Past Surgical History:  Procedure Laterality Date  . ABLATION SAPHENOUS VEIN W/ RFA     2002  . CARPAL TUNNEL RELEASE Left   . CRANIOTOMY N/A 09/30/2012   suboccipital craniectomy  . CRANIOTOMY Left 02/09/2013   Procedure: LEFT PARIETAL CRANIOTOMY with stealth;  Surgeon: [name], MD;  Location: [facility];  Service: Neurosurgery;  Laterality: Left;  LEFT Parietal Craniotomy for tumor with stealth  . DEEP AXILLARY SENTINEL NODE BIOPSY / EXCISION     due to extensive Melanoma-right arm  . fatty tumor removed     from chest  . INSERT / REPLACE / REMOVE PACEMAKER  2012   1999, x 3  . LAPAROSCOPY  09/06/2011   Procedure: LAPAROSCOPY OPERATIVE;  Surgeon: Claiborne Billings A. Pamala Hurry, MD;  Location: [facility];  Service: Gynecology;  Laterality: Left;  with Left Ovarian Cystectomy   . MELANOMA EXCISION     with removal of lymph nodes, left shoulder  . NOSE SURGERY  2010, 2012   for nose bleeds x 2  . TONSILLECTOMY AND ADENOIDECTOMY      Current Outpatient Medications  Medication Sig Dispense Refill  . acetaminophen (TYLENOL) 500 MG tablet Take 500 mg by mouth every 6 (six) hours as needed for pain.     Marland Kitchen  albuterol (VENTOLIN HFA) 108 (90 Base) MCG/ACT inhaler Inhale 2 puffs into the lungs as needed. 1 Inhaler 1  . baclofen (LIORESAL) 10 MG tablet TAKE 0.5 TABLETS BY MOUTH 2 TIMES A DAY AS NEEDED FOR MUSCLE SPASMS.  3  . cloNIDine (CATAPRES) 0.1 MG tablet Take 1 tablet (0.1 mg total) by mouth at bedtime. 90 tablet 3  . CREON 36000 units CPEP capsule     . cyanocobalamin (,VITAMIN B-12,) 1000 MCG/ML injection Inject 1 mL (1,000 mcg total) into the skin every 30 (thirty) days. 1 mL 11  . diphenhydrAMINE (BENADRYL) 25 MG tablet Take 12.5 mg by mouth every 6 (six) hours as needed. Seasonal allergies    . esomeprazole (NEXIUM) 40 MG capsule TAKE ONE CAPSULE BY  MOUTH TWICE A DAY BEFORE MEALS *PT WANTS 90 DAY SUPPLY* 180 capsule 0  . fluorometholone (FML) 0.1 % ophthalmic suspension 1 DROP INTO BOTH EYES AS DIRECTED 4X DAILY X 2 WEEKS THEN TWICE DAILY X 2 WEEKS  1  . fluticasone (FLONASE) 50 MCG/ACT nasal spray Place 2 sprays into both nostrils daily. As directed.  3  . furosemide (LASIX) 40 MG tablet Take 20 mg by mouth daily. Takes PRN    . hydrocortisone (ANUSOL-HC) 25 MG suppository Place 1 suppository (25 mg total) rectally at bedtime as needed for hemorrhoids. 12 suppository 0  . hydrocortisone 2.5 % cream Apply 1 application topically 2 (two) times daily as needed (irritation). 30 g 0  . levETIRAcetam (KEPPRA) 500 MG tablet Take 500 mg by mouth 2 (two) times daily.     Marland Kitchen LOTEMAX 0.5 % ophthalmic suspension 1 DROP EVERY 2 HOURS TO BOTH EYES X 1 DAY, THEN 4 TIMES DAILY X 1 WEEK THEN TWICE DAILY X 1 WEEK  0  . mesalamine (CANASA) 1000 MG suppository Place 1 suppository (1,000 mg total) rectally at bedtime as needed (hemorrhoids). 30 suppository 0  . metFORMIN (GLUCOPHAGE) 500 MG tablet Take 500 mg by mouth daily with breakfast.  3  . metoprolol tartrate (LOPRESSOR) 25 MG tablet Take 1 tablet (25 mg total) by mouth 2 (two) times daily. 180 tablet 3  . potassium chloride SA (K-DUR,KLOR-CON) 20 MEQ tablet Take 1 tablet (20 mEq total) by mouth 2 (two) times daily. 60 tablet 3  . Syringe/Needle, Disp, (SYRINGE 3CC/27GX1-1/4") 27G X 1-1/4" 3 ML MISC Inject 1 mL into the muscle every 30 (thirty) days. 12 each 0  . Vitamin D, Ergocalciferol, (DRISDOL) 50000 units CAPS capsule Take 1 capsule (50,000 Units total) by mouth every 7 (seven) days. Further refills must be authorized by patients PCP. 4 capsule 0   No current facility-administered medications for this visit.     Allergies  Allergen Reactions  . Doxycycline Hives  . Erythromycin Hives  . Other Hives    Cannot use  Cream or Suppositories  . Penicillins Hives  . Sulfa Antibiotics Hives  .  Preparation H [Pramox-Pe-Glycerin-Petrolatum] Hives    Cannot use  Cream or Suppositories   . Promethazine     Other reaction(s): Other (See Comments) Causes restless leg syndrome Other reaction(s): Other (See Comments) Causes restless leg syndrome  . Sulfonamide Derivatives Hives  . Phenergan [Promethazine Hcl] Other (See Comments)    Causes restless leg syndrome    Review of Systems negative except from HPI and PMH  Physical Exam BP 116/76   Pulse 71   Ht 5\' 3"  (1.6 m)   Wt 142 lb 14.4 oz (64.8 kg)   SpO2 96%   BMI  25.31 kg/m  RRR Clear  No edema    ECG sinus @ 71   Assessment and  Plan  Atrial  Tach  Pacemaker [employer]  Normal function   Dysautonomia   Sinus node dysfunction   Pancreatic Insufficiency of some mechanical kind   No interval atrial tach   Continue fluid and sodium repletion  Device function normal

## 2017-02-25 ENCOUNTER — Other Ambulatory Visit: Payer: Self-pay | Admitting: Emergency Medicine

## 2017-02-25 MED ORDER — POTASSIUM CHLORIDE CRYS ER 20 MEQ PO TBCR: 20.0000 meq | EXTENDED_RELEASE_TABLET | Freq: Two times a day (BID) | ORAL | 3 refills | Status: DC

## 2017-02-27 ENCOUNTER — Inpatient Hospital Stay: Payer: Medicare Other | Attending: Oncology | Admitting: Oncology

## 2017-02-27 ENCOUNTER — Encounter: Payer: Self-pay | Admitting: Oncology

## 2017-02-27 ENCOUNTER — Other Ambulatory Visit: Payer: Self-pay

## 2017-02-27 VITALS — BP 123/74 | HR 72 | Temp 97.9°F | Resp 17 | Ht 63.0 in | Wt 142.2 lb

## 2017-02-27 DIAGNOSIS — C7931 Secondary malignant neoplasm of brain: Secondary | ICD-10-CM

## 2017-02-27 DIAGNOSIS — C439 Malignant melanoma of skin, unspecified: Secondary | ICD-10-CM | POA: Diagnosis not present

## 2017-02-27 DIAGNOSIS — C799 Secondary malignant neoplasm of unspecified site: Secondary | ICD-10-CM

## 2017-02-27 NOTE — Progress Notes (Signed)
[facility] OFFICE PROGRESS NOTE   Diagnosis: Melanoma  INTERVAL HISTORY:    Ms. Biancardi returns as scheduled.  She continues to have intermittent diarrhea, but this has improved since starting budesonide.  She is followed by GI at [facility].  She has a poor appetite. She has developed a "knot "at the left arm and fullness in the left axilla beginning approximately 1 week ago.  A brain MRI on 01/18/2017 revealed no evidence of disease progression.  She is scheduled for restaging CTs at [facility] next week.  A colonoscopy at [facility] 12/06/2016 revealed diverticulosis in the sigmoid and descending colon.  A biopsy from the cecum revealed increased intraepithelial lymphocytes compatible with microscopic colitis  Objective:  Vital signs in last 24 hours:  Blood pressure 123/74, pulse 72, temperature 97.9 F (36.6 C), temperature source Oral, resp. rate 17, height 5' 3"  (1.6 m), weight 142 lb 3.2 oz (64.5 kg), SpO2 99 %.    HEENT: Neck without mass Lymphatics: No cervical, supraclavicular, axillary, or inguinal nodes Resp: Lungs clear bilaterally Cardio: Regular rate and rhythm GI: No hepatomegaly, no mass Vascular: No leg edema  Skin: Right upper arm scar without evidence of recurrent tumor.  Soft mobile fullness superior and medial to the left olecranon.   Medications: I have reviewed the patient's current medications.   Assessment/Plan: 1.Metastatic melanoma  -Status post resection of a right arm melanoma, stage II A (T2b N0) in December 2009  -Metastatic melanoma, status post resection of a left cerebellar metastasis 09/30/2012, BRAF mutation identified  -Status post SRS treatment of the left cerebellum and a left parietal metastasis 10/24/2012  -Staging CTs and PET scan without evidence of distant metastatic disease aside from indeterminate small lung nodules  -Restaging CTs 12/01/2012 consistent with enlargement of the left parietal mass and right lower lobe lung  nodule  -Initiation of dabrafenib and tremetinib 12/17/2012. She reports extremely poor tolerance. Discontinued.  -Status post resection of a left parietal metastasis 02/09/2013.  -Restaging CT evaluation 03/03/2013. Stable 1.5 cm right lower lobe pulmonary nodule. Multiple peripheral enhancing lesions throughout the liver.  -Initiation of Ipilimumab at [facility] 03/10/2013.  -Cycle 2 Ipilumumab 04/17/2013  -Cycle 3 Ipilumumab 05/14/2013  -Cycle 4 Ipilumumab 06/04/2013  -Restaging CT at [facility] 06/25/2013 with multiple subcentimeter liver lesions, no new lesions, decreased right lower lobe nodule.  -Brain MRI 07/07/2013 with no evidence of progressive metastatic disease.  -Brain CT 10/09/2013 with no evidence of progressive metastatic disease  -Brain CT and CTs of the chest, abdomen, and pelvis at [facility] on 11/03/2013 with no evidence of progressive disease  -CTs of the brain, chest, abdomen, and pelvis at [facility] on 02/02/2014 with no evidence of disease progression -CTs of the brain, chest, abdomen, and pelvis at [facility] on 05/04/2014-no evidence of disease progression -CTs of the brain, chest, abdomen, and pelvis at [facility] on 08/03/2014-stable -CTs of the brain, chest, abdomen, and pelvis at [facility] on 11/02/2014-stable -CTs of the brain, chest, abdomen, and pelvis at [facility] on 02/08/2015-stable -CTs of the brain, chest, abdomen, and pelvis at [facility] on 05/10/2015-stable  -CTs of the brain, chest, abdomen, and pelvis at [facility] on 05/03/2016-no evidence of recurrent disease -CTs of the brain, chest, abdomen, and pelvis at [facility] on 09/28/2016-no evidence of recurrent disease  -Brain MRI 01/18/2017- no evidence of disease progression 2. Arrhythmia, pacemaker in place.  3. Hypertension  4. History of a CVA  5. History of deep vein thrombosis  6. History of proximal leg weakness-likely related to chronic steroid  use.  7. Intermittent rash at the posterior thighs and left hand-likely related to systemic therapy.   8. Leg edema-? Related to liver disease,? Secondary to ipilumumab . Improved on furosemide.  9. Headache and mild nausea-coincided with discontinuation of Decadron, CT of the brain 03/25/2011 with no acute change.  10. Diarrhea following meals-etiology unclear, colonoscopy 12/06/2016-diverticulosis in the left colon, no other significant findings, biopsy from the cecum revealed changes consistent with microscopic colitis 11. History of Mild elevation of lipase-likely mild asymptomatic pancreatitis related to Ipilumumab  12. history of Red cell microcytosis and iron deficiency 03/17/2014-negative stool Hemoccults, improved    Disposition: She remains in clinical remission for melanoma.  She will continue follow-up with after [facility] at [facility] for management of the diarrhea.  She is scheduled for restaging CTs at [facility] next week.  I suspect the soft fullness in the left arm is a lipoma or cyst.  I cannot appreciate an abnormality in the left axilla today.   Betsy Coder, MD  02/27/2017  12:32 PM   She is scheduled for follow-up here 03/12/2017.  The plan is to cancel this appointment and rescheduled for 3-4 months if the [name] CTs are negative.

## 2017-03-07 DIAGNOSIS — I10 Essential (primary) hypertension: Secondary | ICD-10-CM | POA: Diagnosis not present

## 2017-03-07 DIAGNOSIS — C7931 Secondary malignant neoplasm of brain: Secondary | ICD-10-CM | POA: Diagnosis not present

## 2017-03-07 DIAGNOSIS — C7800 Secondary malignant neoplasm of unspecified lung: Secondary | ICD-10-CM | POA: Diagnosis not present

## 2017-03-07 DIAGNOSIS — G9001 Carotid sinus syncope: Secondary | ICD-10-CM | POA: Diagnosis not present

## 2017-03-07 DIAGNOSIS — Z7984 Long term (current) use of oral hypoglycemic drugs: Secondary | ICD-10-CM | POA: Diagnosis not present

## 2017-03-07 DIAGNOSIS — J309 Allergic rhinitis, unspecified: Secondary | ICD-10-CM | POA: Diagnosis not present

## 2017-03-07 DIAGNOSIS — K580 Irritable bowel syndrome with diarrhea: Secondary | ICD-10-CM | POA: Diagnosis not present

## 2017-03-07 DIAGNOSIS — C4361 Malignant melanoma of right upper limb, including shoulder: Secondary | ICD-10-CM | POA: Diagnosis not present

## 2017-03-07 DIAGNOSIS — Z800 Family history of malignant neoplasm of digestive organs: Secondary | ICD-10-CM | POA: Diagnosis not present

## 2017-03-07 DIAGNOSIS — Z79899 Other long term (current) drug therapy: Secondary | ICD-10-CM | POA: Diagnosis not present

## 2017-03-07 DIAGNOSIS — C439 Malignant melanoma of skin, unspecified: Secondary | ICD-10-CM | POA: Diagnosis not present

## 2017-03-07 DIAGNOSIS — F411 Generalized anxiety disorder: Secondary | ICD-10-CM | POA: Diagnosis not present

## 2017-03-07 DIAGNOSIS — E119 Type 2 diabetes mellitus without complications: Secondary | ICD-10-CM | POA: Diagnosis not present

## 2017-03-07 DIAGNOSIS — Z9225 Personal history of immunosupression therapy: Secondary | ICD-10-CM | POA: Diagnosis not present

## 2017-03-07 DIAGNOSIS — F419 Anxiety disorder, unspecified: Secondary | ICD-10-CM | POA: Diagnosis not present

## 2017-03-07 DIAGNOSIS — D1722 Benign lipomatous neoplasm of skin and subcutaneous tissue of left arm: Secondary | ICD-10-CM | POA: Diagnosis not present

## 2017-03-07 DIAGNOSIS — J329 Chronic sinusitis, unspecified: Secondary | ICD-10-CM | POA: Diagnosis not present

## 2017-03-07 DIAGNOSIS — Z8673 Personal history of transient ischemic attack (TIA), and cerebral infarction without residual deficits: Secondary | ICD-10-CM | POA: Diagnosis not present

## 2017-03-07 DIAGNOSIS — R918 Other nonspecific abnormal finding of lung field: Secondary | ICD-10-CM | POA: Diagnosis not present

## 2017-03-07 DIAGNOSIS — Z7951 Long term (current) use of inhaled steroids: Secondary | ICD-10-CM | POA: Diagnosis not present

## 2017-03-07 DIAGNOSIS — I4891 Unspecified atrial fibrillation: Secondary | ICD-10-CM | POA: Diagnosis not present

## 2017-03-07 DIAGNOSIS — Z950 Presence of cardiac pacemaker: Secondary | ICD-10-CM | POA: Diagnosis not present

## 2017-03-07 DIAGNOSIS — C787 Secondary malignant neoplasm of liver and intrahepatic bile duct: Secondary | ICD-10-CM | POA: Diagnosis not present

## 2017-03-07 DIAGNOSIS — E118 Type 2 diabetes mellitus with unspecified complications: Secondary | ICD-10-CM | POA: Diagnosis not present

## 2017-03-07 DIAGNOSIS — G40909 Epilepsy, unspecified, not intractable, without status epilepticus: Secondary | ICD-10-CM | POA: Diagnosis not present

## 2017-03-07 DIAGNOSIS — K529 Noninfective gastroenteritis and colitis, unspecified: Secondary | ICD-10-CM | POA: Diagnosis not present

## 2017-03-07 DIAGNOSIS — Z6825 Body mass index (BMI) 25.0-25.9, adult: Secondary | ICD-10-CM | POA: Diagnosis not present

## 2017-03-12 ENCOUNTER — Telehealth: Payer: Self-pay

## 2017-03-12 ENCOUNTER — Ambulatory Visit: Payer: Medicare Other | Admitting: Oncology

## 2017-03-12 ENCOUNTER — Ambulatory Visit
Admission: RE | Admit: 2017-03-12 | Discharge: 2017-03-12 | Disposition: A | Discharge status: Home / Self Care | Payer: Medicare Other | Source: Ambulatory Visit | Attending: Urology | Admitting: Urology

## 2017-03-12 DIAGNOSIS — C7931 Secondary malignant neoplasm of brain: Secondary | ICD-10-CM

## 2017-03-12 DIAGNOSIS — C799 Secondary malignant neoplasm of unspecified site: Secondary | ICD-10-CM

## 2017-03-12 DIAGNOSIS — C439 Malignant melanoma of skin, unspecified: Secondary | ICD-10-CM

## 2017-03-12 NOTE — Telephone Encounter (Signed)
Called to inform patient that it is not necessary for her to be at todays appointment per MD. Patient voiced understanding and states that she "tried to cancel that appt yesterday". Voiced understanding.

## 2017-03-14 ENCOUNTER — Telehealth: Payer: Self-pay | Admitting: Internal Medicine

## 2017-03-14 NOTE — Telephone Encounter (Signed)
New message   [name] at [facility] infusion pharmacy calling to get refill foran insuions she is having tomorrow She was informed it was faxed but no documentation. Please call

## 2017-03-14 NOTE — Telephone Encounter (Signed)
[name] with [facility] called back. Orders were not signed. Asked if they could take a verbal order. Willette Cluster stated yes. Per Dr. Caryl Comes on 02/22/17 from office visit note, " Continue fluid and sodium repletion".

## 2017-03-14 NOTE — Telephone Encounter (Signed)
Called [name] back with [employer]. Willette Cluster stated she has received fax from [facility] for orders. Informed Willette Cluster to call back if she has any other questions or concerns. [name] verbalized understanding.

## 2017-03-14 NOTE — Telephone Encounter (Signed)
Called [name] with [facility]. She stated that she was suppose to receive a fax for an order for fluids for patient. Will investigate.

## 2017-03-18 IMAGING — MR MR HEAD WO/W CM
7 of 8 series · 29 of 48 positions shown · IV contrast (multihance)
Comparison: CT 09/08/2015.  MRI 02/24/2015.

CLINICAL DATA: Metastatic melanoma.  Staging.

EXAM:
MRI HEAD WITHOUT AND WITH CONTRAST
TECHNIQUE: Multiplanar, multiecho pulse sequences of the brain and surrounding
structures were obtained without and with intravenous contrast.
CONTRAST:  14mL MULTIHANCE GADOBENATE DIMEGLUMINE 529 MG/ML IV SOLN

[Series 3: T1 · sagittal · 3.0mm · 0.47mm/px · 4 of 43 slices shown]
[im 1/43]
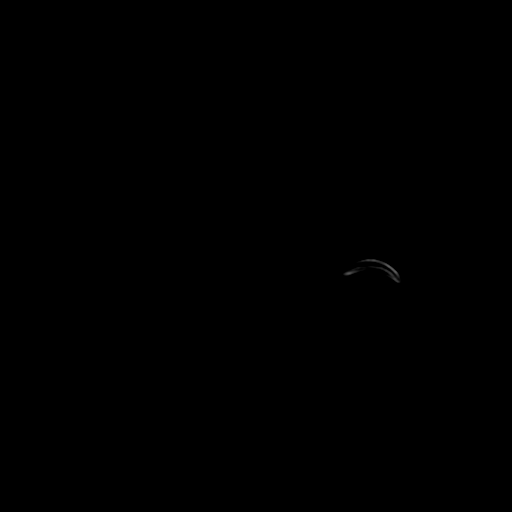
[im 15/43]
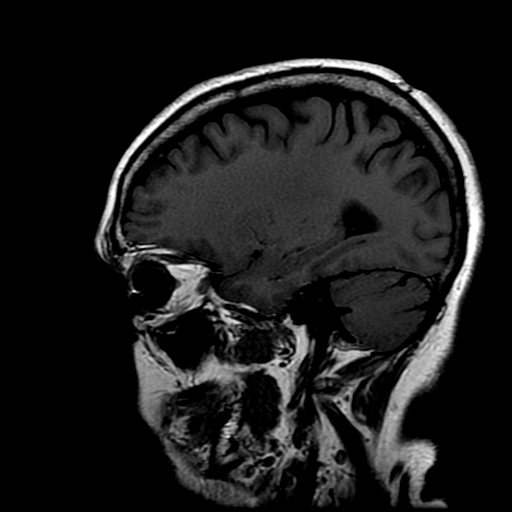
[im 29/43]
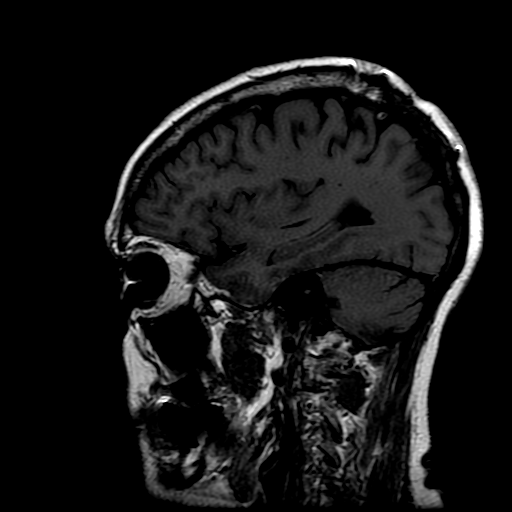
[im 43/43]
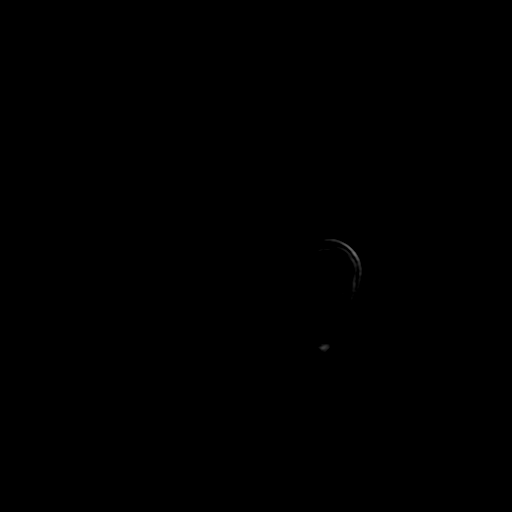

[Series 4: DWI · axial · 3.0mm · 1.09mm/px · z∈[-61,+76]mm · 9 of 94 slices shown (1 of 2)]
[im 1/94]
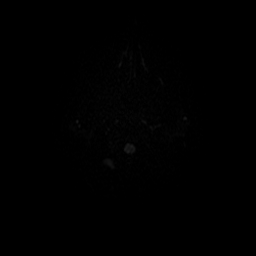
[im 12/94]
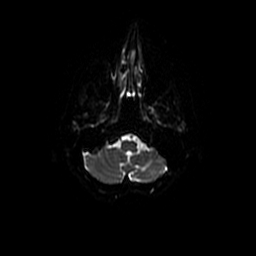
[im 24/94]
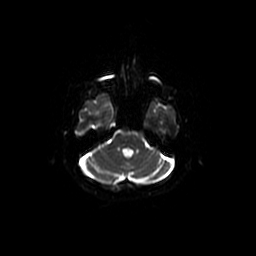
[im 35/94]
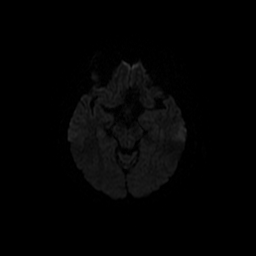
[im 47/94]
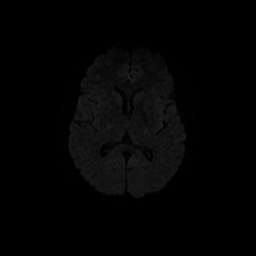
[im 59/94]
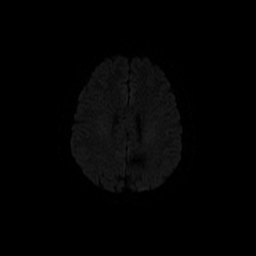
[im 70/94]
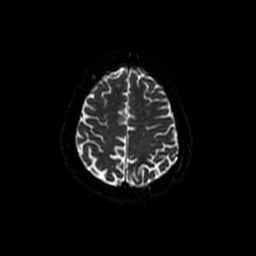
[im 82/94]
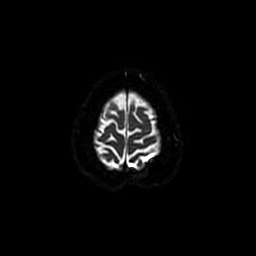
[im 94/94]
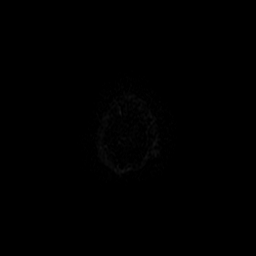

[Series 5: T2 · axial · 5.0mm · 0.45mm/px · z∈[-61,+77]mm · 3 of 24 slices shown (1 of 2)]
[im 1/24]
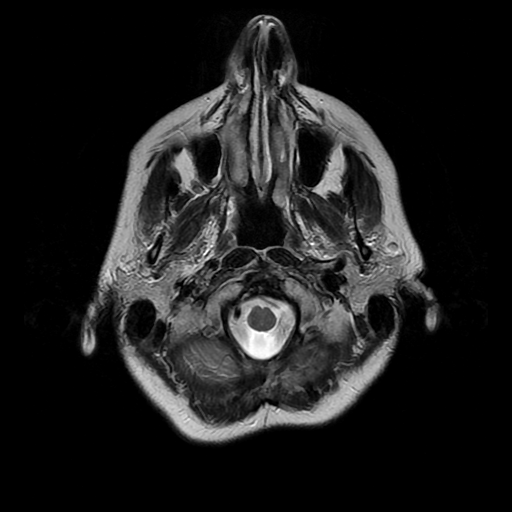
[im 12/24]
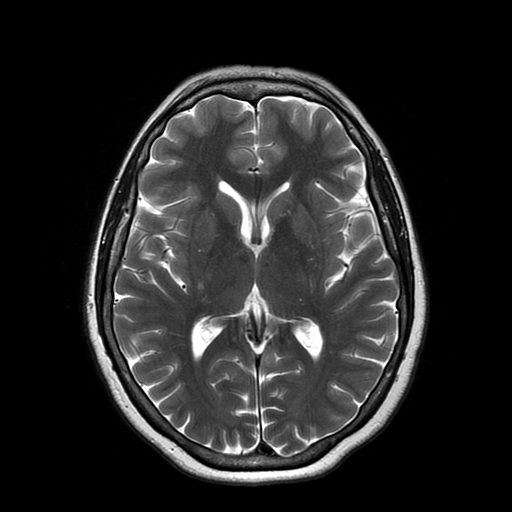
[im 24/24]
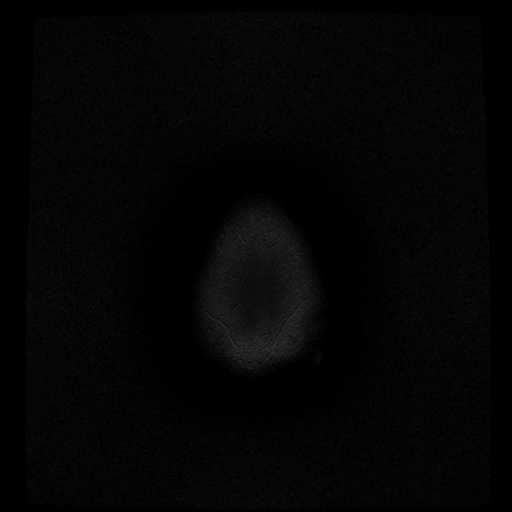

[Series 6: FLAIR · axial · 5.0mm · 0.45mm/px · z∈[-58,+80]mm · 3 of 24 slices shown]
[im 1/24]
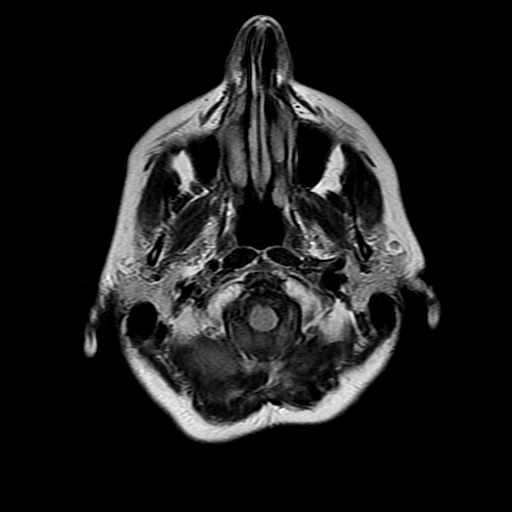
[im 12/24]
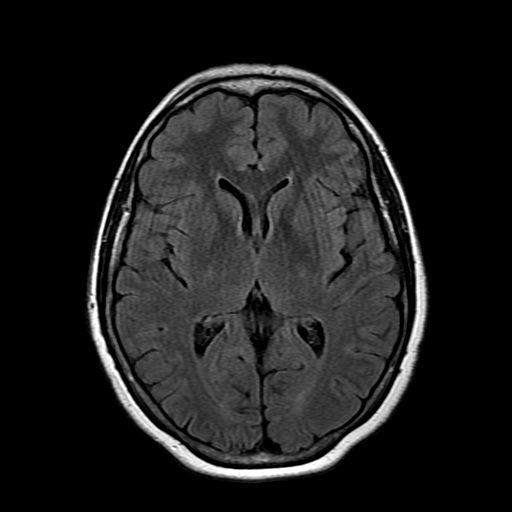
[im 24/24]
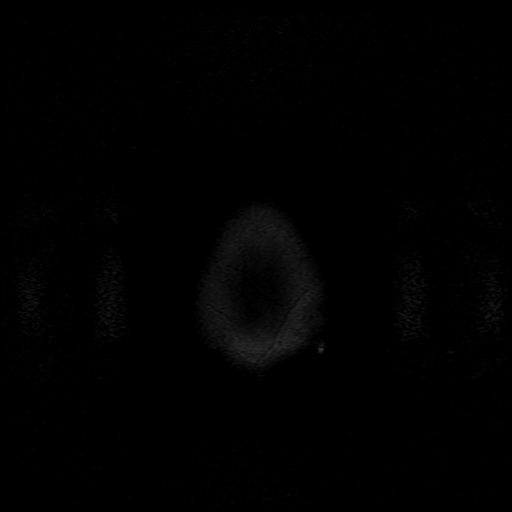

[Series 7: ax mpgr · axial · 5.0mm · 0.45mm/px · 1 of 25 slices shown]
[im 1/25]
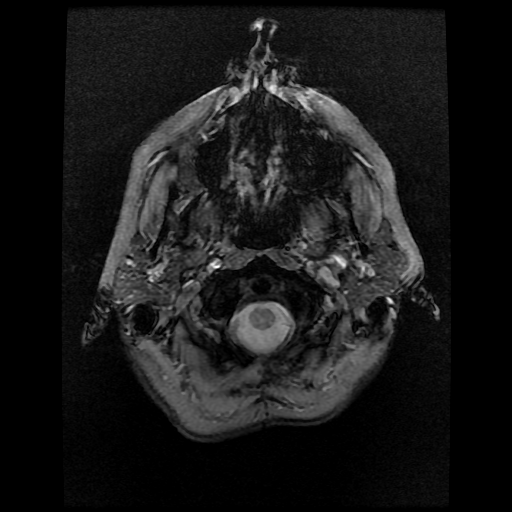

[Series 8: T2 · coronal · 3.0mm · 0.39mm/px · 4 of 40 slices shown (2 of 2)]
[im 1/40]
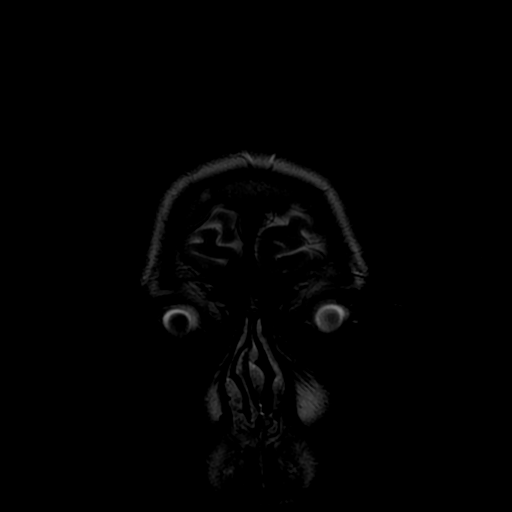
[im 14/40]
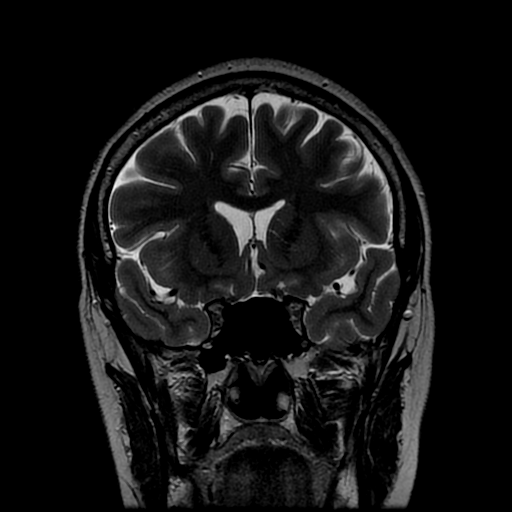
[im 27/40]
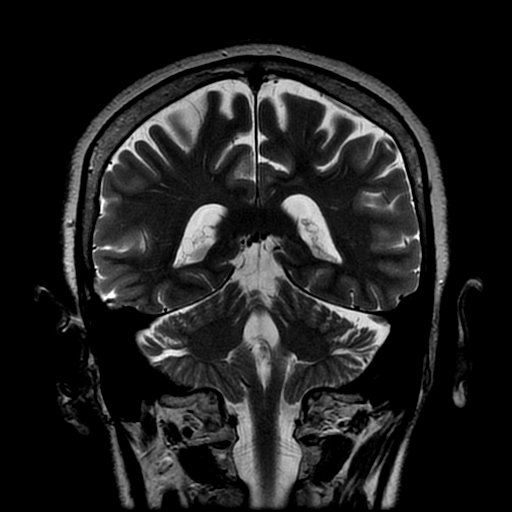
[im 40/40]
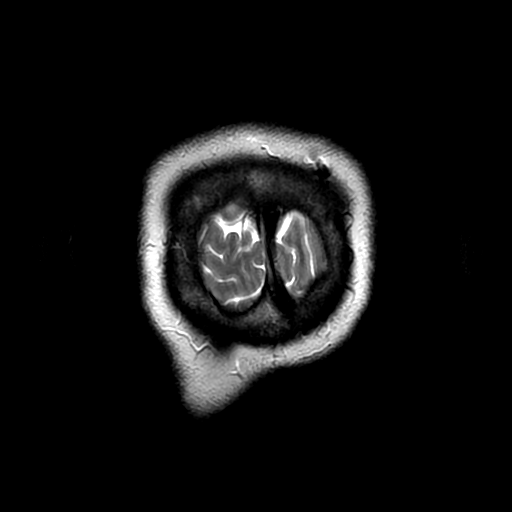

[Series 400: DWI · axial · 3.0mm · 1.09mm/px · z∈[-61,+76]mm · 5 of 47 slices shown (2 of 2)]
[im 1/47]
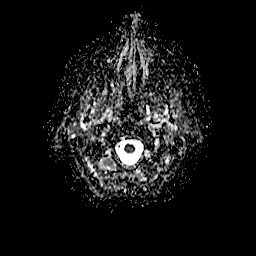
[im 12/47]
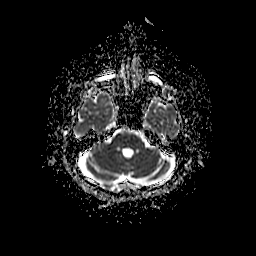
[im 24/47]
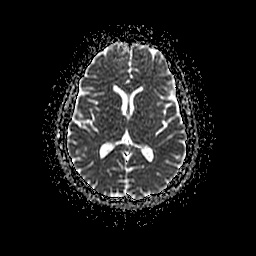
[im 35/47]
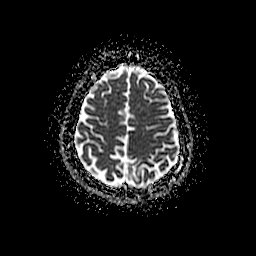
[im 47/47]
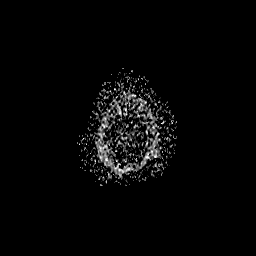

[29 of 48 positions shown; findings below may reference images not displayed]

FINDINGS: During the examination, the patient was monitored by doctor Verner of
cardiology.

Brain: No change since the previous exam. Diffusion imaging does not
show any acute stroke or other cause of restricted diffusion.
Previous left occipital craniotomy for resection of a cerebellar
metastasis appears the same. Small linear focus of abnormal
enhancement at the resection site is stable. No evidence of edema or
mass effect.

Previous left parietal resection also showing volume loss and
gliosis with a small region of enhancement measuring approximately 6
x 7 mm. No progressive change in that region.

No new enhancing lesion to suggest new metastatic lesion.

No hydrocephalus or extra-axial fluid collection.

Vascular: Major vessels at the base of the brain show flow.

Skull and upper cervical spine: Negative

Sinuses/Orbits: Clear/normal

Other: None significant
IMPRESSION: Stable examination since 04/26/2014. No evidence of new metastatic
disease or recurrent disease at previous treatment sites.
Enhancement in the left cerebellum and left posteromedial parietal
lobe are stable, consistent with post treatment scarring.

## 2017-03-22 ENCOUNTER — Encounter: Payer: Self-pay | Admitting: Urology

## 2017-03-22 NOTE — Progress Notes (Signed)
Patient called and cancelled her scheduled f/u visit on 03/11/17.  She was recently seen in follow up with Dr. Benay Spice on 7/0/96 and saw Dr. Harriet Masson at [facility] on 2/83/66.  She appears to be doing very well clinically which correlates well to findings on recent systemic and brain imaging.  She remains in remission from her metastatic melanoma and recent brain imaging reveals stability of previously treated lesions and no new suspicious lesions to indicate disease progression or recurrence.  She prefers to continue routine followup at [facility] as well as with Dr. Ellene Route and Dr. Benay Spice.   We are happy to continue to participate in her care if clinically indicated.   Nicholos Johns, PA-C

## 2017-03-29 ENCOUNTER — Other Ambulatory Visit: Payer: Self-pay | Admitting: Internal Medicine

## 2017-03-29 DIAGNOSIS — I498 Other specified cardiac arrhythmias: Secondary | ICD-10-CM | POA: Diagnosis not present

## 2017-03-29 DIAGNOSIS — R42 Dizziness and giddiness: Secondary | ICD-10-CM | POA: Diagnosis not present

## 2017-03-29 DIAGNOSIS — E119 Type 2 diabetes mellitus without complications: Secondary | ICD-10-CM | POA: Diagnosis not present

## 2017-03-29 DIAGNOSIS — I69398 Other sequelae of cerebral infarction: Secondary | ICD-10-CM | POA: Diagnosis not present

## 2017-03-29 DIAGNOSIS — Z452 Encounter for adjustment and management of vascular access device: Secondary | ICD-10-CM | POA: Diagnosis not present

## 2017-03-29 DIAGNOSIS — G908 Other disorders of autonomic nervous system: Secondary | ICD-10-CM | POA: Diagnosis not present

## 2017-04-02 DIAGNOSIS — I498 Other specified cardiac arrhythmias: Secondary | ICD-10-CM | POA: Diagnosis not present

## 2017-04-02 DIAGNOSIS — Z452 Encounter for adjustment and management of vascular access device: Secondary | ICD-10-CM | POA: Diagnosis not present

## 2017-04-02 DIAGNOSIS — G908 Other disorders of autonomic nervous system: Secondary | ICD-10-CM | POA: Diagnosis not present

## 2017-04-02 DIAGNOSIS — I69398 Other sequelae of cerebral infarction: Secondary | ICD-10-CM | POA: Diagnosis not present

## 2017-04-02 DIAGNOSIS — R42 Dizziness and giddiness: Secondary | ICD-10-CM | POA: Diagnosis not present

## 2017-04-02 DIAGNOSIS — E119 Type 2 diabetes mellitus without complications: Secondary | ICD-10-CM | POA: Diagnosis not present

## 2017-04-16 DIAGNOSIS — Z6825 Body mass index (BMI) 25.0-25.9, adult: Secondary | ICD-10-CM | POA: Diagnosis not present

## 2017-04-16 DIAGNOSIS — K52831 Collagenous colitis: Secondary | ICD-10-CM | POA: Diagnosis not present

## 2017-04-19 DIAGNOSIS — E119 Type 2 diabetes mellitus without complications: Secondary | ICD-10-CM | POA: Diagnosis not present

## 2017-04-19 DIAGNOSIS — I498 Other specified cardiac arrhythmias: Secondary | ICD-10-CM | POA: Diagnosis not present

## 2017-04-19 DIAGNOSIS — I69398 Other sequelae of cerebral infarction: Secondary | ICD-10-CM | POA: Diagnosis not present

## 2017-04-19 DIAGNOSIS — R42 Dizziness and giddiness: Secondary | ICD-10-CM | POA: Diagnosis not present

## 2017-04-19 DIAGNOSIS — Z452 Encounter for adjustment and management of vascular access device: Secondary | ICD-10-CM | POA: Diagnosis not present

## 2017-04-19 DIAGNOSIS — G908 Other disorders of autonomic nervous system: Secondary | ICD-10-CM | POA: Diagnosis not present

## 2017-04-23 DIAGNOSIS — K859 Acute pancreatitis without necrosis or infection, unspecified: Secondary | ICD-10-CM | POA: Diagnosis not present

## 2017-04-23 DIAGNOSIS — I69398 Other sequelae of cerebral infarction: Secondary | ICD-10-CM | POA: Diagnosis not present

## 2017-04-23 DIAGNOSIS — R42 Dizziness and giddiness: Secondary | ICD-10-CM | POA: Diagnosis not present

## 2017-04-23 DIAGNOSIS — G908 Other disorders of autonomic nervous system: Secondary | ICD-10-CM | POA: Diagnosis not present

## 2017-04-23 DIAGNOSIS — I498 Other specified cardiac arrhythmias: Secondary | ICD-10-CM | POA: Diagnosis not present

## 2017-04-23 DIAGNOSIS — E119 Type 2 diabetes mellitus without complications: Secondary | ICD-10-CM | POA: Diagnosis not present

## 2017-04-24 DIAGNOSIS — D2261 Melanocytic nevi of right upper limb, including shoulder: Secondary | ICD-10-CM | POA: Diagnosis not present

## 2017-04-24 DIAGNOSIS — M71342 Other bursal cyst, left hand: Secondary | ICD-10-CM | POA: Diagnosis not present

## 2017-04-24 DIAGNOSIS — L814 Other melanin hyperpigmentation: Secondary | ICD-10-CM | POA: Diagnosis not present

## 2017-04-24 DIAGNOSIS — Z85820 Personal history of malignant melanoma of skin: Secondary | ICD-10-CM | POA: Diagnosis not present

## 2017-04-24 DIAGNOSIS — D2262 Melanocytic nevi of left upper limb, including shoulder: Secondary | ICD-10-CM | POA: Diagnosis not present

## 2017-04-24 DIAGNOSIS — L2089 Other atopic dermatitis: Secondary | ICD-10-CM | POA: Diagnosis not present

## 2017-04-24 DIAGNOSIS — M71341 Other bursal cyst, right hand: Secondary | ICD-10-CM | POA: Diagnosis not present

## 2017-04-24 DIAGNOSIS — Z85828 Personal history of other malignant neoplasm of skin: Secondary | ICD-10-CM | POA: Diagnosis not present

## 2017-04-24 DIAGNOSIS — L910 Hypertrophic scar: Secondary | ICD-10-CM | POA: Diagnosis not present

## 2017-05-02 DIAGNOSIS — I10 Essential (primary) hypertension: Secondary | ICD-10-CM | POA: Diagnosis not present

## 2017-05-02 DIAGNOSIS — Z6826 Body mass index (BMI) 26.0-26.9, adult: Secondary | ICD-10-CM | POA: Diagnosis not present

## 2017-05-02 DIAGNOSIS — C7949 Secondary malignant neoplasm of other parts of nervous system: Secondary | ICD-10-CM | POA: Diagnosis not present

## 2017-05-03 ENCOUNTER — Other Ambulatory Visit: Payer: Self-pay | Admitting: Internal Medicine

## 2017-05-06 ENCOUNTER — Telehealth: Payer: Self-pay | Admitting: Internal Medicine

## 2017-05-06 DIAGNOSIS — I498 Other specified cardiac arrhythmias: Secondary | ICD-10-CM | POA: Diagnosis not present

## 2017-05-06 DIAGNOSIS — I69398 Other sequelae of cerebral infarction: Secondary | ICD-10-CM | POA: Diagnosis not present

## 2017-05-06 DIAGNOSIS — K859 Acute pancreatitis without necrosis or infection, unspecified: Secondary | ICD-10-CM | POA: Diagnosis not present

## 2017-05-06 DIAGNOSIS — G908 Other disorders of autonomic nervous system: Secondary | ICD-10-CM | POA: Diagnosis not present

## 2017-05-06 DIAGNOSIS — R42 Dizziness and giddiness: Secondary | ICD-10-CM | POA: Diagnosis not present

## 2017-05-06 DIAGNOSIS — E119 Type 2 diabetes mellitus without complications: Secondary | ICD-10-CM | POA: Diagnosis not present

## 2017-05-06 NOTE — Telephone Encounter (Signed)
Carrie from [facility] is calling in regards to patient's NS order..   She is now receiving 2L weekly and the patient would like to add 1-2 extra liters weekly prn..  Please advise, thank you

## 2017-05-06 NOTE — Telephone Encounter (Signed)
New Message   Morey Hummingbird from [place] is calling in reference to patients change in her IV fluid order. Please call to discuss.

## 2017-05-07 NOTE — Telephone Encounter (Signed)
Followed up with patient regarding her order.   Dr Caryl Comes has ordered 2L NS every other day x 3days for treatment of dehydration.  Order was called into [facility] at [phone]. Per [facility], they will be administering the NS on 3/13, 3/15, and 3/18.   Pt is agreeable to this. I assured her the order was called into [facility], but if there were any issues to call me.   She agreed and had no additional questions.

## 2017-05-07 NOTE — Telephone Encounter (Signed)
Follow up    Patient returning call to nurse. Patient requesting a call to  [place] , for ordering extra nurse visits order. Janett Billow [phone] Patient states she has received supplies from [facility] already.  Please call

## 2017-05-07 NOTE — Telephone Encounter (Signed)
Follow up   Patient is calling back in reference to the extra nurse visits. Please call to discuss.

## 2017-05-08 DIAGNOSIS — I69398 Other sequelae of cerebral infarction: Secondary | ICD-10-CM | POA: Diagnosis not present

## 2017-05-08 DIAGNOSIS — K859 Acute pancreatitis without necrosis or infection, unspecified: Secondary | ICD-10-CM | POA: Diagnosis not present

## 2017-05-08 DIAGNOSIS — R42 Dizziness and giddiness: Secondary | ICD-10-CM | POA: Diagnosis not present

## 2017-05-08 DIAGNOSIS — G908 Other disorders of autonomic nervous system: Secondary | ICD-10-CM | POA: Diagnosis not present

## 2017-05-08 DIAGNOSIS — E119 Type 2 diabetes mellitus without complications: Secondary | ICD-10-CM | POA: Diagnosis not present

## 2017-05-08 DIAGNOSIS — I498 Other specified cardiac arrhythmias: Secondary | ICD-10-CM | POA: Diagnosis not present

## 2017-05-10 ENCOUNTER — Telehealth: Payer: Self-pay

## 2017-05-10 DIAGNOSIS — I498 Other specified cardiac arrhythmias: Secondary | ICD-10-CM | POA: Diagnosis not present

## 2017-05-10 DIAGNOSIS — E119 Type 2 diabetes mellitus without complications: Secondary | ICD-10-CM | POA: Diagnosis not present

## 2017-05-10 DIAGNOSIS — G908 Other disorders of autonomic nervous system: Secondary | ICD-10-CM | POA: Diagnosis not present

## 2017-05-10 DIAGNOSIS — R42 Dizziness and giddiness: Secondary | ICD-10-CM | POA: Diagnosis not present

## 2017-05-10 DIAGNOSIS — I69398 Other sequelae of cerebral infarction: Secondary | ICD-10-CM | POA: Diagnosis not present

## 2017-05-10 DIAGNOSIS — K859 Acute pancreatitis without necrosis or infection, unspecified: Secondary | ICD-10-CM | POA: Diagnosis not present

## 2017-05-10 NOTE — Telephone Encounter (Signed)
Called pt's nurse back and she was only available through page. Our office is closed at this time and a message left for her to call the office Monday.

## 2017-05-14 ENCOUNTER — Encounter: Payer: Self-pay | Admitting: Adult Health

## 2017-05-14 ENCOUNTER — Ambulatory Visit: Payer: Self-pay | Admitting: *Deleted

## 2017-05-14 ENCOUNTER — Ambulatory Visit (INDEPENDENT_AMBULATORY_CARE_PROVIDER_SITE_OTHER): Payer: Medicare Other | Admitting: Adult Health

## 2017-05-14 VITALS — BP 140/88 | Temp 97.7°F | Wt 144.0 lb

## 2017-05-14 DIAGNOSIS — J3489 Other specified disorders of nose and nasal sinuses: Secondary | ICD-10-CM | POA: Diagnosis not present
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
## 2017-05-14 NOTE — Telephone Encounter (Signed)
Pt  Woke  Up  This   Am   With  Chills  Feeling  Cold  Seen   Recently  For  diverticulitis    Fever  Dipped  This  Am .    Pt  Has  History   Of   weakned   Immune  System .   Pt is  Awake  And  Alert  And  Oriented. Appt   Made  Today  With  Sallee Provencal.  For  5  Pm   Reason for Disposition . [1] MODERATE weakness (i.e., interferes with work, school, normal activities) AND [2] persists > 3 days  Answer Assessment - Initial Assessment Questions 1. DESCRIPTION: "Describe how you are feeling."     Weak  /  Fatigued   2. SEVERITY: "How bad is it?"  "Can you stand and walk?"   - MILD - Feels weak or tired, but does not interfere with work, school or normal activities   - [place] to stand and walk; weakness interferes with work, school, or normal activities   - SEVERE - Unable to stand or walk     Moderate  3. ONSET:  "When did the weakness begin?"       This  Am    4. CAUSE: "What do you think is causing the weakness?"         Do  Not  Know   5. MEDICINES: "Have you recently started a new medicine or had a change in the amount of a medicine?"          Finished  cipro  Taking  Flagyl  Being treated  For  Diverticulitis   6. OTHER SYMPTOMS: "Do you have any other symptoms?" (e.g., chest pain, fever, cough, SOB, vomiting, diarrhea, bleeding)      Temp today   95.2  Orally   Chills   Slight  Cough   7. PREGNANCY: "Is there any chance you are pregnant?" "When was your last menstrual period?"     n/a  Protocols used: WEAKNESS (GENERALIZED) AND FATIGUE-A-AH

## 2017-05-15 DIAGNOSIS — I69398 Other sequelae of cerebral infarction: Secondary | ICD-10-CM | POA: Diagnosis not present

## 2017-05-15 DIAGNOSIS — G908 Other disorders of autonomic nervous system: Secondary | ICD-10-CM | POA: Diagnosis not present

## 2017-05-15 DIAGNOSIS — J3089 Other allergic rhinitis: Secondary | ICD-10-CM | POA: Diagnosis not present

## 2017-05-15 DIAGNOSIS — E119 Type 2 diabetes mellitus without complications: Secondary | ICD-10-CM | POA: Diagnosis not present

## 2017-05-15 DIAGNOSIS — R42 Dizziness and giddiness: Secondary | ICD-10-CM | POA: Diagnosis not present

## 2017-05-15 DIAGNOSIS — I498 Other specified cardiac arrhythmias: Secondary | ICD-10-CM | POA: Diagnosis not present

## 2017-05-15 DIAGNOSIS — K859 Acute pancreatitis without necrosis or infection, unspecified: Secondary | ICD-10-CM | POA: Diagnosis not present

## 2017-05-28 DIAGNOSIS — Z888 Allergy status to other drugs, medicaments and biological substances status: Secondary | ICD-10-CM | POA: Diagnosis not present

## 2017-05-28 DIAGNOSIS — E119 Type 2 diabetes mellitus without complications: Secondary | ICD-10-CM | POA: Diagnosis not present

## 2017-05-28 DIAGNOSIS — Z7901 Long term (current) use of anticoagulants: Secondary | ICD-10-CM | POA: Diagnosis not present

## 2017-05-28 DIAGNOSIS — Z7951 Long term (current) use of inhaled steroids: Secondary | ICD-10-CM | POA: Diagnosis not present

## 2017-05-28 DIAGNOSIS — Z882 Allergy status to sulfonamides status: Secondary | ICD-10-CM | POA: Diagnosis not present

## 2017-05-28 DIAGNOSIS — Z79899 Other long term (current) drug therapy: Secondary | ICD-10-CM | POA: Diagnosis not present

## 2017-05-28 DIAGNOSIS — Z881 Allergy status to other antibiotic agents status: Secondary | ICD-10-CM | POA: Diagnosis not present

## 2017-05-28 DIAGNOSIS — Z8673 Personal history of transient ischemic attack (TIA), and cerebral infarction without residual deficits: Secondary | ICD-10-CM | POA: Diagnosis not present

## 2017-05-28 DIAGNOSIS — Z800 Family history of malignant neoplasm of digestive organs: Secondary | ICD-10-CM | POA: Diagnosis not present

## 2017-05-28 DIAGNOSIS — K5792 Diverticulitis of intestine, part unspecified, without perforation or abscess without bleeding: Secondary | ICD-10-CM | POA: Diagnosis not present

## 2017-05-28 DIAGNOSIS — I10 Essential (primary) hypertension: Secondary | ICD-10-CM | POA: Diagnosis not present

## 2017-05-28 DIAGNOSIS — Z880 Allergy status to penicillin: Secondary | ICD-10-CM | POA: Diagnosis not present

## 2017-05-28 DIAGNOSIS — Z6823 Body mass index (BMI) 23.0-23.9, adult: Secondary | ICD-10-CM | POA: Diagnosis not present

## 2017-05-28 DIAGNOSIS — R1032 Left lower quadrant pain: Secondary | ICD-10-CM | POA: Diagnosis not present

## 2017-05-28 DIAGNOSIS — Z85820 Personal history of malignant melanoma of skin: Secondary | ICD-10-CM | POA: Diagnosis not present

## 2017-05-28 DIAGNOSIS — Z86718 Personal history of other venous thrombosis and embolism: Secondary | ICD-10-CM | POA: Diagnosis not present

## 2017-05-28 DIAGNOSIS — I4891 Unspecified atrial fibrillation: Secondary | ICD-10-CM | POA: Diagnosis not present

## 2017-05-28 DIAGNOSIS — K52831 Collagenous colitis: Secondary | ICD-10-CM | POA: Diagnosis not present

## 2017-05-28 DIAGNOSIS — R6889 Other general symptoms and signs: Secondary | ICD-10-CM | POA: Diagnosis not present

## 2017-05-28 DIAGNOSIS — K521 Toxic gastroenteritis and colitis: Secondary | ICD-10-CM | POA: Diagnosis not present

## 2017-05-28 DIAGNOSIS — Z9889 Other specified postprocedural states: Secondary | ICD-10-CM | POA: Diagnosis not present

## 2017-05-28 DIAGNOSIS — Z1329 Encounter for screening for other suspected endocrine disorder: Secondary | ICD-10-CM | POA: Diagnosis not present

## 2017-05-28 DIAGNOSIS — T451X5D Adverse effect of antineoplastic and immunosuppressive drugs, subsequent encounter: Secondary | ICD-10-CM | POA: Diagnosis not present

## 2017-06-04 ENCOUNTER — Other Ambulatory Visit: Payer: Self-pay | Admitting: Internal Medicine

## 2017-06-05 DIAGNOSIS — R1032 Left lower quadrant pain: Secondary | ICD-10-CM | POA: Diagnosis not present

## 2017-06-05 DIAGNOSIS — Z85820 Personal history of malignant melanoma of skin: Secondary | ICD-10-CM | POA: Diagnosis not present

## 2017-06-13 DIAGNOSIS — R197 Diarrhea, unspecified: Secondary | ICD-10-CM | POA: Diagnosis not present

## 2017-06-21 DIAGNOSIS — E119 Type 2 diabetes mellitus without complications: Secondary | ICD-10-CM | POA: Diagnosis not present

## 2017-06-21 DIAGNOSIS — I69398 Other sequelae of cerebral infarction: Secondary | ICD-10-CM | POA: Diagnosis not present

## 2017-06-21 DIAGNOSIS — G908 Other disorders of autonomic nervous system: Secondary | ICD-10-CM | POA: Diagnosis not present

## 2017-06-21 DIAGNOSIS — R42 Dizziness and giddiness: Secondary | ICD-10-CM | POA: Diagnosis not present

## 2017-06-21 DIAGNOSIS — K859 Acute pancreatitis without necrosis or infection, unspecified: Secondary | ICD-10-CM | POA: Diagnosis not present

## 2017-06-21 DIAGNOSIS — I498 Other specified cardiac arrhythmias: Secondary | ICD-10-CM | POA: Diagnosis not present

## 2017-06-22 DIAGNOSIS — I69398 Other sequelae of cerebral infarction: Secondary | ICD-10-CM | POA: Diagnosis not present

## 2017-06-22 DIAGNOSIS — G908 Other disorders of autonomic nervous system: Secondary | ICD-10-CM | POA: Diagnosis not present

## 2017-06-22 DIAGNOSIS — I498 Other specified cardiac arrhythmias: Secondary | ICD-10-CM | POA: Diagnosis not present

## 2017-06-22 DIAGNOSIS — E119 Type 2 diabetes mellitus without complications: Secondary | ICD-10-CM | POA: Diagnosis not present

## 2017-06-22 DIAGNOSIS — K859 Acute pancreatitis without necrosis or infection, unspecified: Secondary | ICD-10-CM | POA: Diagnosis not present

## 2017-06-22 DIAGNOSIS — Z452 Encounter for adjustment and management of vascular access device: Secondary | ICD-10-CM | POA: Diagnosis not present

## 2017-06-24 ENCOUNTER — Telehealth: Payer: Self-pay

## 2017-06-24 NOTE — Telephone Encounter (Signed)
Per Dr. Benay Spice pt refill for Klor-con M20 tablets should be done by PCP. [facility] pharmacy notified via fax.

## 2017-08-05 DIAGNOSIS — D1722 Benign lipomatous neoplasm of skin and subcutaneous tissue of left arm: Secondary | ICD-10-CM | POA: Diagnosis not present

## 2017-08-05 DIAGNOSIS — Z85820 Personal history of malignant melanoma of skin: Secondary | ICD-10-CM | POA: Diagnosis not present

## 2017-08-05 DIAGNOSIS — M7130 Other bursal cyst, unspecified site: Secondary | ICD-10-CM | POA: Diagnosis not present

## 2017-08-05 DIAGNOSIS — Z85828 Personal history of other malignant neoplasm of skin: Secondary | ICD-10-CM | POA: Diagnosis not present

## 2017-08-08 DIAGNOSIS — K859 Acute pancreatitis without necrosis or infection, unspecified: Secondary | ICD-10-CM | POA: Diagnosis not present

## 2017-08-08 DIAGNOSIS — I498 Other specified cardiac arrhythmias: Secondary | ICD-10-CM | POA: Diagnosis not present

## 2017-08-08 DIAGNOSIS — I69398 Other sequelae of cerebral infarction: Secondary | ICD-10-CM | POA: Diagnosis not present

## 2017-08-08 DIAGNOSIS — E119 Type 2 diabetes mellitus without complications: Secondary | ICD-10-CM | POA: Diagnosis not present

## 2017-08-08 DIAGNOSIS — Z452 Encounter for adjustment and management of vascular access device: Secondary | ICD-10-CM | POA: Diagnosis not present

## 2017-08-08 DIAGNOSIS — G908 Other disorders of autonomic nervous system: Secondary | ICD-10-CM | POA: Diagnosis not present

## 2017-08-13 DIAGNOSIS — Z6825 Body mass index (BMI) 25.0-25.9, adult: Secondary | ICD-10-CM | POA: Diagnosis not present

## 2017-08-13 DIAGNOSIS — K52831 Collagenous colitis: Secondary | ICD-10-CM | POA: Diagnosis not present

## 2017-08-13 DIAGNOSIS — Z8719 Personal history of other diseases of the digestive system: Secondary | ICD-10-CM | POA: Diagnosis not present

## 2017-08-15 ENCOUNTER — Ambulatory Visit: Payer: BLUE CROSS/BLUE SHIELD | Admitting: Podiatry

## 2017-08-17 ENCOUNTER — Other Ambulatory Visit: Payer: Self-pay | Admitting: Internal Medicine

## 2017-08-19 ENCOUNTER — Ambulatory Visit: Payer: Medicare Other | Admitting: Podiatry

## 2017-08-20 DIAGNOSIS — Z452 Encounter for adjustment and management of vascular access device: Secondary | ICD-10-CM | POA: Diagnosis not present

## 2017-08-20 DIAGNOSIS — I69398 Other sequelae of cerebral infarction: Secondary | ICD-10-CM | POA: Diagnosis not present

## 2017-08-20 DIAGNOSIS — G908 Other disorders of autonomic nervous system: Secondary | ICD-10-CM | POA: Diagnosis not present

## 2017-08-20 DIAGNOSIS — E119 Type 2 diabetes mellitus without complications: Secondary | ICD-10-CM | POA: Diagnosis not present

## 2017-08-20 DIAGNOSIS — K859 Acute pancreatitis without necrosis or infection, unspecified: Secondary | ICD-10-CM | POA: Diagnosis not present

## 2017-08-20 DIAGNOSIS — I498 Other specified cardiac arrhythmias: Secondary | ICD-10-CM | POA: Diagnosis not present

## 2017-08-21 DIAGNOSIS — K859 Acute pancreatitis without necrosis or infection, unspecified: Secondary | ICD-10-CM | POA: Diagnosis not present

## 2017-08-21 DIAGNOSIS — Z452 Encounter for adjustment and management of vascular access device: Secondary | ICD-10-CM | POA: Diagnosis not present

## 2017-08-21 DIAGNOSIS — R42 Dizziness and giddiness: Secondary | ICD-10-CM | POA: Diagnosis not present

## 2017-08-21 DIAGNOSIS — I69398 Other sequelae of cerebral infarction: Secondary | ICD-10-CM | POA: Diagnosis not present

## 2017-08-21 DIAGNOSIS — I498 Other specified cardiac arrhythmias: Secondary | ICD-10-CM | POA: Diagnosis not present

## 2017-08-21 DIAGNOSIS — G908 Other disorders of autonomic nervous system: Secondary | ICD-10-CM | POA: Diagnosis not present

## 2017-09-03 DIAGNOSIS — J45909 Unspecified asthma, uncomplicated: Secondary | ICD-10-CM | POA: Diagnosis not present

## 2017-09-03 DIAGNOSIS — K449 Diaphragmatic hernia without obstruction or gangrene: Secondary | ICD-10-CM | POA: Diagnosis not present

## 2017-09-03 DIAGNOSIS — Z7951 Long term (current) use of inhaled steroids: Secondary | ICD-10-CM | POA: Diagnosis not present

## 2017-09-03 DIAGNOSIS — R238 Other skin changes: Secondary | ICD-10-CM | POA: Diagnosis not present

## 2017-09-03 DIAGNOSIS — C7931 Secondary malignant neoplasm of brain: Secondary | ICD-10-CM | POA: Diagnosis not present

## 2017-09-03 DIAGNOSIS — C319 Malignant neoplasm of accessory sinus, unspecified: Secondary | ICD-10-CM | POA: Diagnosis not present

## 2017-09-03 DIAGNOSIS — R911 Solitary pulmonary nodule: Secondary | ICD-10-CM | POA: Diagnosis not present

## 2017-09-03 DIAGNOSIS — Z6825 Body mass index (BMI) 25.0-25.9, adult: Secondary | ICD-10-CM | POA: Diagnosis not present

## 2017-09-03 DIAGNOSIS — C439 Malignant melanoma of skin, unspecified: Secondary | ICD-10-CM | POA: Diagnosis not present

## 2017-09-03 DIAGNOSIS — R6889 Other general symptoms and signs: Secondary | ICD-10-CM | POA: Diagnosis not present

## 2017-09-03 DIAGNOSIS — K580 Irritable bowel syndrome with diarrhea: Secondary | ICD-10-CM | POA: Diagnosis not present

## 2017-09-03 DIAGNOSIS — C787 Secondary malignant neoplasm of liver and intrahepatic bile duct: Secondary | ICD-10-CM | POA: Diagnosis not present

## 2017-09-03 DIAGNOSIS — Z950 Presence of cardiac pacemaker: Secondary | ICD-10-CM | POA: Diagnosis not present

## 2017-09-03 DIAGNOSIS — Z9289 Personal history of other medical treatment: Secondary | ICD-10-CM | POA: Diagnosis not present

## 2017-09-03 DIAGNOSIS — C7800 Secondary malignant neoplasm of unspecified lung: Secondary | ICD-10-CM | POA: Diagnosis not present

## 2017-09-03 DIAGNOSIS — I4891 Unspecified atrial fibrillation: Secondary | ICD-10-CM | POA: Diagnosis not present

## 2017-09-03 DIAGNOSIS — Z79899 Other long term (current) drug therapy: Secondary | ICD-10-CM | POA: Diagnosis not present

## 2017-09-03 DIAGNOSIS — Z800 Family history of malignant neoplasm of digestive organs: Secondary | ICD-10-CM | POA: Diagnosis not present

## 2017-09-07 DIAGNOSIS — C7931 Secondary malignant neoplasm of brain: Secondary | ICD-10-CM | POA: Diagnosis not present

## 2017-09-12 NOTE — Telephone Encounter (Signed)
This was sent to patient experience, risk, and legal to address the remaining concerns last year.  Making this note to close the encounter.[name]

## 2017-09-20 DIAGNOSIS — G908 Other disorders of autonomic nervous system: Secondary | ICD-10-CM | POA: Diagnosis not present

## 2017-09-20 DIAGNOSIS — I69398 Other sequelae of cerebral infarction: Secondary | ICD-10-CM | POA: Diagnosis not present

## 2017-09-20 DIAGNOSIS — R42 Dizziness and giddiness: Secondary | ICD-10-CM | POA: Diagnosis not present

## 2017-09-20 DIAGNOSIS — K859 Acute pancreatitis without necrosis or infection, unspecified: Secondary | ICD-10-CM | POA: Diagnosis not present

## 2017-09-20 DIAGNOSIS — I498 Other specified cardiac arrhythmias: Secondary | ICD-10-CM | POA: Diagnosis not present

## 2017-09-20 DIAGNOSIS — Z452 Encounter for adjustment and management of vascular access device: Secondary | ICD-10-CM | POA: Diagnosis not present

## 2017-09-24 DIAGNOSIS — Z882 Allergy status to sulfonamides status: Secondary | ICD-10-CM | POA: Diagnosis not present

## 2017-09-24 DIAGNOSIS — R1030 Lower abdominal pain, unspecified: Secondary | ICD-10-CM | POA: Diagnosis not present

## 2017-09-24 DIAGNOSIS — Z6825 Body mass index (BMI) 25.0-25.9, adult: Secondary | ICD-10-CM | POA: Diagnosis not present

## 2017-09-24 DIAGNOSIS — K52832 Lymphocytic colitis: Secondary | ICD-10-CM | POA: Diagnosis not present

## 2017-09-24 DIAGNOSIS — K5792 Diverticulitis of intestine, part unspecified, without perforation or abscess without bleeding: Secondary | ICD-10-CM | POA: Diagnosis not present

## 2017-09-24 DIAGNOSIS — C4359 Malignant melanoma of other part of trunk: Secondary | ICD-10-CM | POA: Diagnosis not present

## 2017-09-24 DIAGNOSIS — K52839 Microscopic colitis, unspecified: Secondary | ICD-10-CM | POA: Diagnosis not present

## 2017-09-24 DIAGNOSIS — Z79899 Other long term (current) drug therapy: Secondary | ICD-10-CM | POA: Diagnosis not present

## 2017-09-24 DIAGNOSIS — I4891 Unspecified atrial fibrillation: Secondary | ICD-10-CM | POA: Diagnosis not present

## 2017-09-24 DIAGNOSIS — Z880 Allergy status to penicillin: Secondary | ICD-10-CM | POA: Diagnosis not present

## 2017-09-24 DIAGNOSIS — Z800 Family history of malignant neoplasm of digestive organs: Secondary | ICD-10-CM | POA: Diagnosis not present

## 2017-09-24 DIAGNOSIS — K52831 Collagenous colitis: Secondary | ICD-10-CM | POA: Diagnosis not present

## 2017-09-24 DIAGNOSIS — C787 Secondary malignant neoplasm of liver and intrahepatic bile duct: Secondary | ICD-10-CM | POA: Diagnosis not present

## 2017-09-25 DIAGNOSIS — G908 Other disorders of autonomic nervous system: Secondary | ICD-10-CM | POA: Diagnosis not present

## 2017-09-25 DIAGNOSIS — I498 Other specified cardiac arrhythmias: Secondary | ICD-10-CM | POA: Diagnosis not present

## 2017-09-25 DIAGNOSIS — R42 Dizziness and giddiness: Secondary | ICD-10-CM | POA: Diagnosis not present

## 2017-09-25 DIAGNOSIS — Z452 Encounter for adjustment and management of vascular access device: Secondary | ICD-10-CM | POA: Diagnosis not present

## 2017-09-25 DIAGNOSIS — K859 Acute pancreatitis without necrosis or infection, unspecified: Secondary | ICD-10-CM | POA: Diagnosis not present

## 2017-09-25 DIAGNOSIS — I69398 Other sequelae of cerebral infarction: Secondary | ICD-10-CM | POA: Diagnosis not present

## 2017-10-04 DIAGNOSIS — Z452 Encounter for adjustment and management of vascular access device: Secondary | ICD-10-CM | POA: Diagnosis not present

## 2017-10-04 DIAGNOSIS — I69398 Other sequelae of cerebral infarction: Secondary | ICD-10-CM | POA: Diagnosis not present

## 2017-10-04 DIAGNOSIS — G908 Other disorders of autonomic nervous system: Secondary | ICD-10-CM | POA: Diagnosis not present

## 2017-10-04 DIAGNOSIS — I498 Other specified cardiac arrhythmias: Secondary | ICD-10-CM | POA: Diagnosis not present

## 2017-10-04 DIAGNOSIS — K859 Acute pancreatitis without necrosis or infection, unspecified: Secondary | ICD-10-CM | POA: Diagnosis not present

## 2017-10-04 DIAGNOSIS — R42 Dizziness and giddiness: Secondary | ICD-10-CM | POA: Diagnosis not present

## 2017-10-10 DIAGNOSIS — D045 Carcinoma in situ of skin of trunk: Secondary | ICD-10-CM | POA: Diagnosis not present

## 2017-10-10 DIAGNOSIS — Z85820 Personal history of malignant melanoma of skin: Secondary | ICD-10-CM | POA: Diagnosis not present

## 2017-10-10 DIAGNOSIS — D485 Neoplasm of uncertain behavior of skin: Secondary | ICD-10-CM | POA: Diagnosis not present

## 2017-10-10 DIAGNOSIS — Z85828 Personal history of other malignant neoplasm of skin: Secondary | ICD-10-CM | POA: Diagnosis not present

## 2017-10-18 DIAGNOSIS — K859 Acute pancreatitis without necrosis or infection, unspecified: Secondary | ICD-10-CM | POA: Diagnosis not present

## 2017-10-18 DIAGNOSIS — R42 Dizziness and giddiness: Secondary | ICD-10-CM | POA: Diagnosis not present

## 2017-10-18 DIAGNOSIS — G908 Other disorders of autonomic nervous system: Secondary | ICD-10-CM | POA: Diagnosis not present

## 2017-10-18 DIAGNOSIS — Z452 Encounter for adjustment and management of vascular access device: Secondary | ICD-10-CM | POA: Diagnosis not present

## 2017-10-18 DIAGNOSIS — I69398 Other sequelae of cerebral infarction: Secondary | ICD-10-CM | POA: Diagnosis not present

## 2017-10-18 DIAGNOSIS — I498 Other specified cardiac arrhythmias: Secondary | ICD-10-CM | POA: Diagnosis not present

## 2017-10-20 DIAGNOSIS — I498 Other specified cardiac arrhythmias: Secondary | ICD-10-CM | POA: Diagnosis not present

## 2017-10-20 DIAGNOSIS — I69398 Other sequelae of cerebral infarction: Secondary | ICD-10-CM | POA: Diagnosis not present

## 2017-10-20 DIAGNOSIS — K859 Acute pancreatitis without necrosis or infection, unspecified: Secondary | ICD-10-CM | POA: Diagnosis not present

## 2017-10-20 DIAGNOSIS — R42 Dizziness and giddiness: Secondary | ICD-10-CM | POA: Diagnosis not present

## 2017-10-20 DIAGNOSIS — Z452 Encounter for adjustment and management of vascular access device: Secondary | ICD-10-CM | POA: Diagnosis not present

## 2017-10-20 DIAGNOSIS — G908 Other disorders of autonomic nervous system: Secondary | ICD-10-CM | POA: Diagnosis not present

## 2017-10-25 DIAGNOSIS — G908 Other disorders of autonomic nervous system: Secondary | ICD-10-CM | POA: Diagnosis not present

## 2017-10-25 DIAGNOSIS — I69398 Other sequelae of cerebral infarction: Secondary | ICD-10-CM | POA: Diagnosis not present

## 2017-10-25 DIAGNOSIS — I498 Other specified cardiac arrhythmias: Secondary | ICD-10-CM | POA: Diagnosis not present

## 2017-10-25 DIAGNOSIS — Z452 Encounter for adjustment and management of vascular access device: Secondary | ICD-10-CM | POA: Diagnosis not present

## 2017-10-25 DIAGNOSIS — R42 Dizziness and giddiness: Secondary | ICD-10-CM | POA: Diagnosis not present

## 2017-10-25 DIAGNOSIS — K859 Acute pancreatitis without necrosis or infection, unspecified: Secondary | ICD-10-CM | POA: Diagnosis not present

## 2017-11-01 DIAGNOSIS — Z452 Encounter for adjustment and management of vascular access device: Secondary | ICD-10-CM | POA: Diagnosis not present

## 2017-11-01 DIAGNOSIS — K859 Acute pancreatitis without necrosis or infection, unspecified: Secondary | ICD-10-CM | POA: Diagnosis not present

## 2017-11-01 DIAGNOSIS — I69398 Other sequelae of cerebral infarction: Secondary | ICD-10-CM | POA: Diagnosis not present

## 2017-11-01 DIAGNOSIS — R42 Dizziness and giddiness: Secondary | ICD-10-CM | POA: Diagnosis not present

## 2017-11-01 DIAGNOSIS — I498 Other specified cardiac arrhythmias: Secondary | ICD-10-CM | POA: Diagnosis not present

## 2017-11-01 DIAGNOSIS — G908 Other disorders of autonomic nervous system: Secondary | ICD-10-CM | POA: Diagnosis not present

## 2017-11-06 DIAGNOSIS — C7931 Secondary malignant neoplasm of brain: Secondary | ICD-10-CM | POA: Diagnosis not present

## 2017-11-06 DIAGNOSIS — I10 Essential (primary) hypertension: Secondary | ICD-10-CM | POA: Diagnosis not present

## 2017-11-08 DIAGNOSIS — I498 Other specified cardiac arrhythmias: Secondary | ICD-10-CM | POA: Diagnosis not present

## 2017-11-08 DIAGNOSIS — I69398 Other sequelae of cerebral infarction: Secondary | ICD-10-CM | POA: Diagnosis not present

## 2017-11-08 DIAGNOSIS — Z452 Encounter for adjustment and management of vascular access device: Secondary | ICD-10-CM | POA: Diagnosis not present

## 2017-11-08 DIAGNOSIS — G908 Other disorders of autonomic nervous system: Secondary | ICD-10-CM | POA: Diagnosis not present

## 2017-11-08 DIAGNOSIS — R42 Dizziness and giddiness: Secondary | ICD-10-CM | POA: Diagnosis not present

## 2017-11-08 DIAGNOSIS — K859 Acute pancreatitis without necrosis or infection, unspecified: Secondary | ICD-10-CM | POA: Diagnosis not present

## 2017-11-11 DIAGNOSIS — H1013 Acute atopic conjunctivitis, bilateral: Secondary | ICD-10-CM | POA: Diagnosis not present

## 2017-11-11 DIAGNOSIS — H16223 Keratoconjunctivitis sicca, not specified as Sjogren's, bilateral: Secondary | ICD-10-CM | POA: Diagnosis not present

## 2017-11-15 DIAGNOSIS — R42 Dizziness and giddiness: Secondary | ICD-10-CM | POA: Diagnosis not present

## 2017-11-15 DIAGNOSIS — G908 Other disorders of autonomic nervous system: Secondary | ICD-10-CM | POA: Diagnosis not present

## 2017-11-15 DIAGNOSIS — K859 Acute pancreatitis without necrosis or infection, unspecified: Secondary | ICD-10-CM | POA: Diagnosis not present

## 2017-11-15 DIAGNOSIS — I498 Other specified cardiac arrhythmias: Secondary | ICD-10-CM | POA: Diagnosis not present

## 2017-11-15 DIAGNOSIS — Z452 Encounter for adjustment and management of vascular access device: Secondary | ICD-10-CM | POA: Diagnosis not present

## 2017-11-15 DIAGNOSIS — I69398 Other sequelae of cerebral infarction: Secondary | ICD-10-CM | POA: Diagnosis not present

## 2017-11-20 ENCOUNTER — Other Ambulatory Visit: Payer: Self-pay | Admitting: Internal Medicine

## 2017-11-21 ENCOUNTER — Ambulatory Visit (INDEPENDENT_AMBULATORY_CARE_PROVIDER_SITE_OTHER): Payer: Medicare Other | Admitting: Family Medicine

## 2017-11-21 ENCOUNTER — Encounter: Payer: Self-pay | Admitting: Family Medicine

## 2017-11-21 VITALS — BP 136/74 | HR 66 | Temp 97.9°F | Wt 148.1 lb

## 2017-11-21 DIAGNOSIS — R059 Cough, unspecified: Secondary | ICD-10-CM

## 2017-11-21 DIAGNOSIS — R05 Cough: Secondary | ICD-10-CM | POA: Diagnosis not present

## 2017-11-21 DIAGNOSIS — J209 Acute bronchitis, unspecified: Secondary | ICD-10-CM | POA: Diagnosis not present

## 2017-11-21 MED ORDER — CEFUROXIME AXETIL 500 MG PO TABS: 500.0000 mg | ORAL_TABLET | Freq: Two times a day (BID) | ORAL | 0 refills | Status: DC

## 2017-11-21 MED ORDER — CEFTRIAXONE SODIUM 1 G IJ SOLR
1.0000 g | Freq: Once | INTRAMUSCULAR | Status: AC
  Administered 2017-11-21: 1 g via INTRAMUSCULAR

## 2017-11-21 MED ORDER — ALBUTEROL SULFATE HFA 108 (90 BASE) MCG/ACT IN AERS: 2.0000 | INHALATION_SPRAY | RESPIRATORY_TRACT | 0 refills | Status: DC | PRN

## 2017-11-21 NOTE — Progress Notes (Signed)
   Subjective:    Patient ID: [name], female    DOB: [birth year], 62 y.o.   MRN: [id number]  HPI Here for 4 days of sinus congestion, PND, chest tightness ,wheezing, and coughing up yellow sputum. She has had fever up to 99.9 degrees.    Review of Systems  Constitutional: Positive for fever.  HENT: Positive for congestion, postnasal drip, sinus pressure and sore throat. Negative for sinus pain.   Eyes: Negative.   Respiratory: Positive for cough, chest tightness, shortness of breath and wheezing.   Cardiovascular: Negative.        Objective:   Physical Exam  Constitutional:  Coughing frequently   HENT:  Right Ear: External ear normal.  Left Ear: External ear normal.  Nose: Nose normal.  Mouth/Throat: Oropharynx is clear and moist.  Eyes: Conjunctivae are normal.  Neck: Neck supple. No thyromegaly present.  Cardiovascular: Normal rate, regular rhythm, normal heart sounds and intact distal pulses.  Pulmonary/Chest: Effort normal. No respiratory distress. She has no rales.  Scattered rhonchi and wheezes   Lymphadenopathy:    She has no cervical adenopathy.          Assessment & Plan:  Bronchitis, given a nebulization treatment with Duoneb and a shot of Rocephin. Follow with 10 days of Ceftin. Use the albuterol inhaler prn.  [name], MD

## 2017-11-22 ENCOUNTER — Telehealth: Payer: Self-pay | Admitting: Internal Medicine

## 2017-11-22 NOTE — Telephone Encounter (Signed)
Copied from [place] [id number]. Topic: General - Other >> Nov 22, 2017  8:13 AM Keene Breath wrote: Reason for CRM: Patient called to let Dr. Sarajane Jews know that she has gotten worse from her visit with him yesterday.  The medication he prescribed has not helped and she is not feeling well.  Dr. Regis Bill was not available yesterday so so she saw Dr. Sarajane Jews.  Please advise and have nurse call her back to see what the next step should be.  CB# [phone]

## 2017-11-22 NOTE — Telephone Encounter (Signed)
Dr. Fry please advise. Thanks  

## 2017-11-23 ENCOUNTER — Emergency Department (INDEPENDENT_AMBULATORY_CARE_PROVIDER_SITE_OTHER)
Admission: EM | Admit: 2017-11-23 | Discharge: 2017-11-23 | Disposition: A | Discharge status: Home / Self Care | Payer: Medicare Other | Source: Home / Self Care | Attending: Family Medicine | Admitting: Family Medicine

## 2017-11-23 ENCOUNTER — Emergency Department (INDEPENDENT_AMBULATORY_CARE_PROVIDER_SITE_OTHER): Payer: Medicare Other

## 2017-11-23 ENCOUNTER — Other Ambulatory Visit: Payer: Self-pay

## 2017-11-23 DIAGNOSIS — J069 Acute upper respiratory infection, unspecified: Secondary | ICD-10-CM

## 2017-11-23 DIAGNOSIS — B9789 Other viral agents as the cause of diseases classified elsewhere: Secondary | ICD-10-CM

## 2017-11-23 DIAGNOSIS — R509 Fever, unspecified: Secondary | ICD-10-CM

## 2017-11-23 DIAGNOSIS — R05 Cough: Secondary | ICD-10-CM | POA: Diagnosis not present

## 2017-11-23 DIAGNOSIS — R0602 Shortness of breath: Secondary | ICD-10-CM | POA: Diagnosis not present

## 2017-11-23 LAB — POCT CBC W AUTO DIFF (K'VILLE URGENT CARE)

## 2017-11-23 MED ORDER — BENZONATATE 200 MG PO CAPS: ORAL_CAPSULE | ORAL | 0 refills | Status: DC

## 2017-11-23 NOTE — ED Triage Notes (Addendum)
Pt saw PCP Friday. Was diagnosed with bacterial bronchitis. But no CXR was done. Pt has stage 4 cancer. Was given Rocephin in ofc as well as oral antibiotic to follow. Does not seem to be improving. Called an oncall doc who referred her to [facility] for CXR.

## 2017-11-23 NOTE — Discharge Instructions (Addendum)
Take plain guaifenesin (1200mg  extended release tabs such as Mucinex) twice daily, with plenty of water, for cough and congestion.  Get adequate rest.   Also recommend using saline nasal spray several times daily and saline nasal irrigation (AYR is a common brand).   Try warm salt water gargles for sore throat.  Continue albuterol inhaler as prescribed.  Finish antibiotic Stop all antihistamines for now, and other non-prescription cough/cold preparations. May take Delsym Cough Suppressant with Tessalon at bedtime for nighttime cough.

## 2017-11-23 NOTE — ED Provider Notes (Signed)
[name] CARE    CSN: [id number] Arrival date & time: 11/23/17  1445     History   Chief Complaint Chief Complaint  Patient presents with  . Cough  . Back Pain    HPI [name] is a 62 y.o. female.   One week ago patient developed typical cold-like symptoms developing over several days, including mild sore throat, sinus congestion, fatigue, myalgias, and cough.  She visited her PCP who diagnosed bronchitis and administered Rocephin injection followed by cefuroxime 500mg  BID.  She has had occasional wheezing and is using both an albuterol inhaler and albuterol by nebulizer.  She feels that she has not improved. She has a history of malignant melanoma, presently stable.  She had asthma as a child.  The history is provided by the patient and the spouse.    Past Medical History:  Diagnosis Date  . Asthma   . Atrial fibrillation ([place])   . Atrial tachycardia ([place])   . Cervicalgia   . Complication of anesthesia    pt states wakes up with "shakes"  . CVA (cerebral infarction)    2012 with dizziness and vision change felt embolic from atrial tachy  . Disorder of bone and cartilage, unspecified   . Disturbance of skin sensation   . Diverticulosis   . DM (diabetes mellitus) ([place])   . DVT (deep venous thrombosis) ([place])   . DVT (deep venous thrombosis) ([place])   . Family history of anesthesia complication    PONV  . History of pacemaker   . History of radiation therapy 10/24/12   brain  . HLD (hyperlipidemia)   . HTN (hypertension)   . Hypoglycemia, unspecified   . 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 Osteopenia   . Other nonspecific abnormal serum enzyme levels   . Other specified congenital anomalies of nervous system   . Pacemaker    autonomic dysfunction  . Pancreatitis    from therapy  . PONV (postoperative nausea and vomiting)   . POTS (postural orthostatic tachycardia syndrome)   . Skin cancer    Hx: of lung lesion  .  Stroke [facility])    left weaker   . Swelling of limb     Patient Active Problem List   Diagnosis Date Noted  . Dehydration 05/18/2016  . History of pacemaker   . POTS (postural orthostatic tachycardia syndrome)   . Iron deficiency anemia 11/11/2014  . Chronic colitis 08/28/2013  . Malignant melanoma, metastatic ([place]) 02/09/2013  . Malignant melanoma ([place]) 12/01/2012  . Brain metastasis ([place]) 10/16/2012  . Hypertension 09/30/2012  . Anxiety state, unspecified 09/30/2012  . Hypoxemia 09/30/2012  . Dysautonomia ([place]) 02/25/2011  . Anticoagulant long-term use 01/19/2011  . Neuritis 01/19/2011  . Skin change 01/19/2011  . CVA (cerebral infarction)   . Palpitation 06/28/2010  . Pacemaker-Biotronik 06/28/2010  . Dizziness and giddiness 04/27/2010  . DIVERTICULOSIS, COLON, HX OF 01/19/2009  . CAROTID SINUS SYNDROME 11/17/2008  . OTHER MALAISE AND FATIGUE 11/17/2008  . HYPERTENSION, BENIGN 11/02/2008  . ATRIAL TACHYCARDIA 09/16/2008  . SYNCOPE, HX OF 08/09/2008  . INJURY UNSPEC NERVE SHOULDER GIRDLE&UPPER LIMB 07/12/2008  . PERSONAL HISTORY OF MALIGNANT MELANOMA OF SKIN 07/12/2008  . LUQ PAIN 12/08/2007  . NECK PAIN 09/26/2007  . NUMBNESS, ARM 09/26/2007  . CPK, ABNORMAL 09/26/2007  . ARM PAIN, LEFT 06/25/2007  . SWELLING OF LIMB 06/25/2007  . OSTEOPENIA 06/25/2007    Past Surgical History:  Procedure Laterality Date  .  ABLATION SAPHENOUS VEIN W/ RFA     2002  . CARPAL TUNNEL RELEASE Left   . CRANIOTOMY N/A 09/30/2012   suboccipital craniectomy  . CRANIOTOMY Left 02/09/2013   Procedure: LEFT PARIETAL CRANIOTOMY with stealth;  Surgeon: [name], MD;  Location: [place] [facility];  Service: Neurosurgery;  Laterality: Left;  LEFT Parietal Craniotomy for tumor with stealth  . DEEP AXILLARY SENTINEL NODE BIOPSY / EXCISION     due to extensive Melanoma-right arm  . fatty tumor removed     from chest  . INSERT / REPLACE / REMOVE PACEMAKER  2012   1999, x 3  . LAPAROSCOPY  09/06/2011     Procedure: LAPAROSCOPY OPERATIVE;  Surgeon: Claiborne Billings A. Pamala Hurry, MD;  Location: [place] [facility];  Service: Gynecology;  Laterality: Left;  with Left Ovarian Cystectomy   . MELANOMA EXCISION     with removal of lymph nodes, left shoulder  . NOSE SURGERY  2010, 2012   for nose bleeds x 2  . TONSILLECTOMY AND ADENOIDECTOMY      OB History   None      Home Medications    Prior to Admission medications   Medication Sig Start Date End Date Taking? Authorizing Provider  acetaminophen (TYLENOL) 500 MG tablet Take 500 mg by mouth every 6 (six) hours as needed for pain.     [provider]  albuterol (VENTOLIN HFA) 108 (90 Base) MCG/ACT inhaler Inhale 2 puffs into the lungs every 4 (four) hours as needed for wheezing or shortness of breath. 11/21/17   Laurey Morale, MD  baclofen (LIORESAL) 10 MG tablet TAKE 0.5 TABLETS BY MOUTH 2 TIMES A DAY AS NEEDED FOR MUSCLE SPASMS. 05/09/16   [provider]  benzonatate (TESSALON) 200 MG capsule Take one cap by mouth at bedtime as needed for cough.  May repeat in 4 to 6 hours 11/23/17   Kandra Nicolas, MD  budesonide (ENTOCORT EC) 3 MG 24 hr capsule Take 9 mg by mouth. 04/16/17 04/16/18  [provider]  cefUROXime (CEFTIN) 500 MG tablet Take 1 tablet (500 mg total) by mouth 2 (two) times daily with a meal. 11/21/17   Laurey Morale, MD  cloNIDine (CATAPRES) 0.1 MG tablet Take 1 tablet (0.1 mg total) by mouth at bedtime. 04/24/16   Deboraha Sprang, MD  CREON 36000 units CPEP capsule  10/05/16   [provider]  cyanocobalamin (,VITAMIN B-12,) 1000 MCG/ML injection INJECT 1 ML (1,000 MCG TOTAL) INTO THE SKIN EVERY 30 (THIRTY) DAYS. **NOT COVERED UNDER PART D** 05/03/17   Deboraha Sprang, MD  diphenhydrAMINE (BENADRYL) 25 MG tablet Take 12.5 mg by mouth every 6 (six) hours as needed. Seasonal allergies    [provider]  esomeprazole (NEXIUM) 40 MG capsule TAKE ONE CAPSULE BY MOUTH TWICE A DAY BEFORE MEALS *PT WANTS 90 DAY  SUPPLY* 12/31/16   Kyung Rudd, MD  fluorometholone (FML) 0.1 % ophthalmic suspension 1 DROP INTO BOTH EYES AS DIRECTED 4X DAILY X 2 WEEKS THEN TWICE DAILY X 2 WEEKS 03/23/16   [provider]  furosemide (LASIX) 40 MG tablet Take 20 mg by mouth daily. Takes PRN    [provider]  hydrocortisone (ANUSOL-HC) 25 MG suppository Place 1 suppository (25 mg total) rectally at bedtime as needed for hemorrhoids. 07/19/15   [name], MD  hydrocortisone 2.5 % cream Apply 1 application topically 2 (two) times daily as needed (irritation). 07/19/15   [name], MD  levETIRAcetam (KEPPRA)  500 MG tablet Take 500 mg by mouth 2 (two) times daily.     [provider]  LOTEMAX 0.5 % ophthalmic suspension 1 DROP EVERY 2 HOURS TO BOTH EYES X 1 DAY, THEN 4 TIMES DAILY X 1 WEEK THEN TWICE DAILY X 1 WEEK 03/19/16   [provider]  mesalamine (CANASA) 1000 MG suppository Place 1 suppository (1,000 mg total) rectally at bedtime as needed (hemorrhoids). 07/19/15   [name], MD  metFORMIN (GLUCOPHAGE) 500 MG tablet Take 500 mg by mouth daily with breakfast. 05/08/16   [provider]  metoprolol tartrate (LOPRESSOR) 25 MG tablet TAKE 1 TABLET (25 MG TOTAL) BY MOUTH 2 (TWO) TIMES DAILY. 06/04/17   Deboraha Sprang, MD  metroNIDAZOLE (FLAGYL) 500 MG tablet TAKE 1 TABLET (500 MG TOTAL) BY MOUTH THREE (3) TIMES A DAY. FOR 7 DAYS 05/05/17   [provider]  potassium chloride SA (K-DUR,KLOR-CON) 20 MEQ tablet Take 1 tablet (20 mEq total) by mouth 2 (two) times daily. 02/25/17   Owens Shark, NP  Syringe/Needle, Disp, (SYRINGE 3CC/27GX1-1/4") 27G X 1-1/4" 3 ML MISC Inject 1 mL into the muscle every 30 (thirty) days. 04/24/16   Deboraha Sprang, MD  Vitamin D, Ergocalciferol, (DRISDOL) 50000 units CAPS capsule TAKE 1 CAPSULE (50,000 UNITS TOTAL) BY MOUTH EVERY 7 (SEVEN) DAYS. 03/29/17   Deboraha Sprang, MD    Family History Family History  Problem Relation Age of  Onset  . Stroke Mother   . Colon polyps Mother   . Heart disease Father   . Diabetes Father   . Kidney disease Father   . Diabetes Maternal Grandmother   . Clotting disorder Maternal Grandmother        stroke  . Crohn's disease Maternal Grandmother   . Diabetes Paternal Grandmother   . Aneurysm Sister        brain    Social History Social History   Tobacco Use  . Smoking status: Never Smoker  . Smokeless tobacco: Never Used  Substance Use Topics  . Alcohol use: No    Comment: glass of wine daily  . Drug use: No     Allergies   Doxycycline; Erythromycin; Other; Penicillins; Sulfa antibiotics; Preparation h [pramox-pe-glycerin-petrolatum]; Promethazine; Sulfonamide derivatives; and Phenergan [promethazine hcl]   Review of Systems Review of Systems + sore throat + cough No pleuritic pain + wheezing + nasal congestion + post-nasal drainage No sinus pain/pressure No itchy/red eyes No earache No hemoptysis No SOB No fever/chills No nausea No vomiting No abdominal pain No diarrhea No urinary symptoms No skin rash + fatigue + myalgias No headache Used OTC meds without relief   Physical Exam Triage Vital Signs ED Triage Vitals  Enc Vitals Group     BP 11/23/17 1614 109/76     Pulse Rate 11/23/17 1614 75     Resp --      Temp 11/23/17 1614 97.7 F (36.5 C)     Temp Source 11/23/17 1614 Oral     SpO2 11/23/17 1614 97 %     Weight --      Height --      Head Circumference --      Peak Flow --      Pain Score 11/23/17 1624 0     Pain Loc --      Pain Edu? --      Excl. in [place]? --    No data found.  Updated Vital Signs BP 109/76 (BP Location:  Left Arm)   Pulse 75   Temp 97.7 F (36.5 C) (Oral)   SpO2 97%   Visual Acuity Right Eye Distance:   Left Eye Distance:   Bilateral Distance:    Right Eye Near:   Left Eye Near:    Bilateral Near:     Physical Exam Nursing notes and Vital Signs reviewed. Appearance:  Patient appears stated age,  and in no acute distress Eyes:  Pupils are equal, round, and reactive to light and accomodation.  Extraocular movement is intact.  Conjunctivae are not inflamed  Ears:  Canals normal.  Tympanic membranes normal.  Nose:  Mildly congested turbinates.  No sinus tenderness. Pharynx:  Normal Neck:  Supple.  Enlarged posterior/lateral nodes are palpated bilaterally, tender to palpation on the left.   Lungs:  Clear to auscultation.  Breath sounds are equal.  Moving air well. Heart:  Regular rate and rhythm without murmurs, rubs, or gallops.  Abdomen:  Nontender without masses or hepatosplenomegaly.  Bowel sounds are present.  No CVA or flank tenderness.  Extremities:  No edema.  Skin:  No rash present.    UC Treatments / Results  Labs (all labs ordered are listed, but only abnormal results are displayed) Labs Reviewed  POCT CBC W AUTO DIFF ([facility]):  WBC 4.2; LY 48.6; MO 10.2; GR 41.2; Hgb 12.3; Platelets 190     EKG None  Radiology Dg Chest 2 View  Result Date: 11/23/2017 CLINICAL DATA:  Cough and fever.  Shortness of breath. EXAM: CHEST - 2 VIEW COMPARISON:  October 11, 2016 FINDINGS: Stable pacemaker. The heart, hila, mediastinum, lungs, and pleura are unremarkable. IMPRESSION: No active cardiopulmonary disease. Electronically Signed   By: Dorise Bullion III M.D   On: 11/23/2017 16:46    Procedures Procedures (including critical care time)  Medications Ordered in UC Medications - No data to display  Initial Impression / Assessment and Plan / UC Course  I have reviewed the triage vital signs and the nursing notes.  Pertinent labs & imaging results that were available during my care of the patient were reviewed by me and considered in my medical decision making (see chart for details).    Negative chest X-ray reassuring.  WBC slightly decreased  Prescription written for Benzonatate (Tessalon) to take at bedtime for night-time cough.  Followup with Family Doctor if not  improved in one week.    Final Clinical Impressions(s) / UC Diagnoses   Final diagnoses:  Viral URI with cough     Discharge Instructions     Take plain guaifenesin (1200mg  extended release tabs such as Mucinex) twice daily, with plenty of water, for cough and congestion.  Get adequate rest.   Also recommend using saline nasal spray several times daily and saline nasal irrigation (AYR is a common brand).   Try warm salt water gargles for sore throat.  Continue albuterol inhaler as prescribed.  Finish antibiotic Stop all antihistamines for now, and other non-prescription cough/cold preparations. May take Delsym Cough Suppressant with Tessalon at bedtime for nighttime cough.     ED Prescriptions    Medication Sig Dispense Auth. Provider   benzonatate (TESSALON) 200 MG capsule Take one cap by mouth at bedtime as needed for cough.  May repeat in 4 to 6 hours 15 capsule Assunta Found Ishmael Holter, MD        Kandra Nicolas, MD 11/26/17 2233

## 2017-11-25 ENCOUNTER — Telehealth: Payer: Self-pay

## 2017-11-25 NOTE — Telephone Encounter (Signed)
Pt states she is feeling somewhat better. Will be following up with her cancer doctor about her low WBC. Advised to call back if any questions.

## 2017-11-26 ENCOUNTER — Telehealth: Payer: Self-pay | Admitting: Internal Medicine

## 2017-11-26 NOTE — Telephone Encounter (Signed)
Call in a Zpack  ?

## 2017-11-26 NOTE — Telephone Encounter (Signed)
I have called and lmomtcb x 1 to have the pt call me back to check and see how she is doing.

## 2017-11-26 NOTE — Telephone Encounter (Signed)
Called and spoke with pt and she stated that she is better than she was on Thursday when she was seen here.  She was seen Saturday at the [facility] and they gave her abx.  She stated that these abx have caused her to have diarrhea and she has tried to take 1/2 the dose but the diarrhea still happens.  She is using the mucinex and she has gotten relief from that.  The cough still starts up about 5 and she said that she coughs her head off at night.  The temp spikes up at night as well and has gotten up to 100 which she stated it high for her.    She wanted to know if she needs to try the zpak and if you want to call that in for her.  Or what do you think?  Please advise. Thanks

## 2017-11-26 NOTE — Telephone Encounter (Signed)
No message needed °

## 2017-11-26 NOTE — Telephone Encounter (Signed)
Patient returning a call to [place]. Please advise.

## 2017-11-26 NOTE — Telephone Encounter (Signed)
I see she went to urgent care and was given a Rocephin shot. She had a CXR that was clear. Please call here to see how she is doing

## 2017-11-27 MED ORDER — AZITHROMYCIN 250 MG PO TABS: ORAL_TABLET | ORAL | 0 refills | Status: DC

## 2017-11-27 NOTE — Telephone Encounter (Signed)
I have called and left a detailed message on the pts VM to make her aware of zpak that has been sent to her pharmacy.

## 2017-11-27 NOTE — Addendum Note (Signed)
Addended by: Elie Confer on: 11/27/2017 01:17 PM   Modules accepted: Orders

## 2017-12-04 ENCOUNTER — Telehealth: Payer: Self-pay | Admitting: Internal Medicine

## 2017-12-04 NOTE — Telephone Encounter (Signed)
Left message for [name] from the Perla at [facility] in regards to patient having questionable MRI.

## 2017-12-04 NOTE — Telephone Encounter (Signed)
° ° °  [place] calling with questions regarding patient getting MRI that Dr Caryl Comes is overseeing. Requesting call from nurse Please call [phone]

## 2017-12-05 ENCOUNTER — Telehealth: Payer: Self-pay | Admitting: Radiation Therapy

## 2017-12-05 ENCOUNTER — Other Ambulatory Visit: Payer: Self-pay | Admitting: Radiation Therapy

## 2017-12-05 DIAGNOSIS — C7949 Secondary malignant neoplasm of other parts of nervous system: Principal | ICD-10-CM

## 2017-12-05 DIAGNOSIS — C7931 Secondary malignant neoplasm of brain: Secondary | ICD-10-CM

## 2017-12-05 NOTE — Telephone Encounter (Signed)
Lauren from Dr. Olin Pia office returned my call about Laquetta's upcoming MRI. Dr. Caryl Comes has asked that we schedule it on Tuesday 10/29 @ 6:00pm  in order for him to attend. I spoke with Megan from the [facility] MRI department, she has approval from her manager to schedule Kiwanna's scan at that time. I will call Mikaela to let her know about this appointment.    MRI - [facility] - Tuesday 6:00pm, arrive at 5:30.    Mont Dutton R.T.(R)(T) Special Procedures Navigator [phone]

## 2017-12-05 NOTE — Telephone Encounter (Signed)
LVM for return call. Per Dr Caryl Comes he has availability on 10/29 @ 6pm

## 2017-12-05 NOTE — Telephone Encounter (Signed)
Pts MRI has been scheduled for 10/29 @ 6pm.

## 2017-12-10 DIAGNOSIS — C439 Malignant melanoma of skin, unspecified: Secondary | ICD-10-CM | POA: Diagnosis not present

## 2017-12-10 DIAGNOSIS — Z79899 Other long term (current) drug therapy: Secondary | ICD-10-CM | POA: Diagnosis not present

## 2017-12-10 DIAGNOSIS — Z950 Presence of cardiac pacemaker: Secondary | ICD-10-CM | POA: Diagnosis not present

## 2017-12-10 DIAGNOSIS — K580 Irritable bowel syndrome with diarrhea: Secondary | ICD-10-CM | POA: Diagnosis not present

## 2017-12-10 DIAGNOSIS — R2241 Localized swelling, mass and lump, right lower limb: Secondary | ICD-10-CM | POA: Diagnosis not present

## 2017-12-10 DIAGNOSIS — Z9289 Personal history of other medical treatment: Secondary | ICD-10-CM | POA: Diagnosis not present

## 2017-12-10 DIAGNOSIS — C7931 Secondary malignant neoplasm of brain: Secondary | ICD-10-CM | POA: Diagnosis not present

## 2017-12-10 DIAGNOSIS — Z800 Family history of malignant neoplasm of digestive organs: Secondary | ICD-10-CM | POA: Diagnosis not present

## 2017-12-10 DIAGNOSIS — Z85820 Personal history of malignant melanoma of skin: Secondary | ICD-10-CM | POA: Diagnosis not present

## 2017-12-10 DIAGNOSIS — D1723 Benign lipomatous neoplasm of skin and subcutaneous tissue of right leg: Secondary | ICD-10-CM | POA: Diagnosis not present

## 2017-12-10 DIAGNOSIS — C787 Secondary malignant neoplasm of liver and intrahepatic bile duct: Secondary | ICD-10-CM | POA: Diagnosis not present

## 2017-12-10 DIAGNOSIS — M25551 Pain in right hip: Secondary | ICD-10-CM | POA: Diagnosis not present

## 2017-12-10 DIAGNOSIS — C4359 Malignant melanoma of other part of trunk: Secondary | ICD-10-CM | POA: Diagnosis not present

## 2017-12-14 ENCOUNTER — Other Ambulatory Visit: Payer: Self-pay | Admitting: Family Medicine

## 2017-12-18 DIAGNOSIS — C4361 Malignant melanoma of right upper limb, including shoulder: Secondary | ICD-10-CM | POA: Diagnosis not present

## 2017-12-18 DIAGNOSIS — D1720 Benign lipomatous neoplasm of skin and subcutaneous tissue of unspecified limb: Secondary | ICD-10-CM | POA: Diagnosis not present

## 2017-12-19 ENCOUNTER — Other Ambulatory Visit: Payer: Self-pay | Admitting: Radiation Therapy

## 2017-12-19 DIAGNOSIS — R42 Dizziness and giddiness: Secondary | ICD-10-CM | POA: Diagnosis not present

## 2017-12-19 DIAGNOSIS — Z452 Encounter for adjustment and management of vascular access device: Secondary | ICD-10-CM | POA: Diagnosis not present

## 2017-12-19 DIAGNOSIS — G908 Other disorders of autonomic nervous system: Secondary | ICD-10-CM | POA: Diagnosis not present

## 2017-12-19 DIAGNOSIS — E119 Type 2 diabetes mellitus without complications: Secondary | ICD-10-CM | POA: Diagnosis not present

## 2017-12-19 DIAGNOSIS — I69398 Other sequelae of cerebral infarction: Secondary | ICD-10-CM | POA: Diagnosis not present

## 2017-12-19 DIAGNOSIS — I498 Other specified cardiac arrhythmias: Secondary | ICD-10-CM | POA: Diagnosis not present

## 2017-12-21 DIAGNOSIS — E119 Type 2 diabetes mellitus without complications: Secondary | ICD-10-CM | POA: Diagnosis not present

## 2017-12-21 DIAGNOSIS — I498 Other specified cardiac arrhythmias: Secondary | ICD-10-CM | POA: Diagnosis not present

## 2017-12-21 DIAGNOSIS — G908 Other disorders of autonomic nervous system: Secondary | ICD-10-CM | POA: Diagnosis not present

## 2017-12-21 DIAGNOSIS — I69398 Other sequelae of cerebral infarction: Secondary | ICD-10-CM | POA: Diagnosis not present

## 2017-12-21 DIAGNOSIS — R42 Dizziness and giddiness: Secondary | ICD-10-CM | POA: Diagnosis not present

## 2017-12-21 DIAGNOSIS — Z452 Encounter for adjustment and management of vascular access device: Secondary | ICD-10-CM | POA: Diagnosis not present

## 2017-12-24 ENCOUNTER — Encounter (HOSPITAL_COMMUNITY): Payer: Self-pay | Admitting: Radiology

## 2017-12-24 ENCOUNTER — Ambulatory Visit (HOSPITAL_COMMUNITY)
Admission: RE | Admit: 2017-12-24 | Discharge: 2017-12-24 | Disposition: A | Discharge status: Home / Self Care | Payer: Medicare Other | Source: Ambulatory Visit | Attending: Neurological Surgery | Admitting: Neurological Surgery

## 2017-12-24 DIAGNOSIS — C7949 Secondary malignant neoplasm of other parts of nervous system: Secondary | ICD-10-CM | POA: Diagnosis not present

## 2017-12-24 DIAGNOSIS — C7931 Secondary malignant neoplasm of brain: Secondary | ICD-10-CM | POA: Diagnosis not present

## 2017-12-24 DIAGNOSIS — Z950 Presence of cardiac pacemaker: Secondary | ICD-10-CM | POA: Diagnosis not present

## 2017-12-24 DIAGNOSIS — C439 Malignant melanoma of skin, unspecified: Secondary | ICD-10-CM | POA: Diagnosis not present

## 2017-12-24 MED ORDER — GADOBUTROL 1 MMOL/ML IV SOLN
7.0000 mL | Freq: Once | INTRAVENOUS | Status: AC | PRN
  Administered 2017-12-24: 6.5 mL via INTRAVENOUS

## 2017-12-24 MED ORDER — DIPHENHYDRAMINE HCL 25 MG PO CAPS: 25.0000 mg | ORAL_CAPSULE | Freq: Four times a day (QID) | ORAL | Status: DC | PRN

## 2017-12-24 MED ORDER — DIPHENHYDRAMINE HCL 50 MG/ML IJ SOLN
INTRAMUSCULAR | Status: AC
  Filled 2017-12-24: qty 1

## 2017-12-24 MED ORDER — DIPHENHYDRAMINE HCL 25 MG PO CAPS
ORAL_CAPSULE | ORAL | Status: AC
  Administered 2017-12-24: 25 mg
  Filled 2017-12-24: qty 1

## 2017-12-24 NOTE — Progress Notes (Addendum)
Metastatis melanoma with brain metastasis  SRS 10/24/2012  Site/dose:    1. Left cerebellar target was treated using 6 Arcs to a prescription dose of 15 Gy. ExacTrac Snap verification was performed for each couch angle.  2. Left parietal target was treated using 3 Arcs to a prescription dose of 20 Gy. ExacTrac Snap verification was performed for each couch angle.  MRI Brain 12/24/2017:  Headache: No Dizziness: No more than usual Nausea/vomiting: No Diplopia: No Ringing in ears: No Visual changes: Blurred Fatigue: Yes Cognitive changes: Memory loss- short term.   BP 137/87 (BP Location: Left Arm, Patient Position: Sitting)   Pulse 82   Temp 97.6 F (36.4 C) (Oral)   Resp 18   Ht 5\' 3"  (1.6 m)   Wt 153 lb 6.4 oz (69.6 kg)   SpO2 99%   BMI 27.17 kg/m

## 2017-12-24 NOTE — Significant Event (Signed)
Rapid Response Event Note  Overview: Allergic Reaction - Contrast  Initial Focused Assessment: Called by MRI tech about patient having acute allergic to contrast dye. Patient received Gadavist and after administration, patient had an acute onset of shortness breath, redness on her neck and hands, and itching. When I arrived, patient was alert, neuro intact, denied being short breath. Dr. Caryl Comes was present and decision made to give Benadryl 25mg  PO. Patient was given benadryl PO at [place] and then after several minutes, patient was taken back into MRI to complete the scan. Patient was monitored during the MRI, redness improved and patient is still no longer short of breath. SBP in the 160s, HR stable, 100% on RA and not in acute respiratory distress.  Interventions: -- Benadryl 25mg  PO  Plan of Care: -- Scan was done as outpatient, Dr. Ellene Route was made aware by Dr. Caryl Comes, patient went home.   Event Summary:    at   Call Time [place] End Time 2005  Betsi Crespi, Delice Lesch

## 2017-12-25 ENCOUNTER — Encounter: Payer: Self-pay | Admitting: Radiation Oncology

## 2017-12-25 ENCOUNTER — Other Ambulatory Visit: Payer: Self-pay

## 2017-12-25 ENCOUNTER — Inpatient Hospital Stay: Payer: Medicare Other | Attending: Radiation Oncology

## 2017-12-25 ENCOUNTER — Ambulatory Visit
Admission: RE | Admit: 2017-12-25 | Discharge: 2017-12-25 | Disposition: A | Discharge status: Home / Self Care | Payer: Medicare Other | Source: Ambulatory Visit | Attending: Radiation Oncology | Admitting: Radiation Oncology

## 2017-12-25 DIAGNOSIS — L720 Epidermal cyst: Secondary | ICD-10-CM | POA: Diagnosis not present

## 2017-12-25 DIAGNOSIS — Z85820 Personal history of malignant melanoma of skin: Secondary | ICD-10-CM | POA: Diagnosis not present

## 2017-12-25 DIAGNOSIS — D179 Benign lipomatous neoplasm, unspecified: Secondary | ICD-10-CM | POA: Diagnosis not present

## 2017-12-25 DIAGNOSIS — L821 Other seborrheic keratosis: Secondary | ICD-10-CM | POA: Diagnosis not present

## 2017-12-25 DIAGNOSIS — Z85828 Personal history of other malignant neoplasm of skin: Secondary | ICD-10-CM | POA: Diagnosis not present

## 2017-12-25 HISTORY — DX: Secondary malignant neoplasm of unspecified lung: C78.00

## 2017-12-25 HISTORY — DX: Secondary malignant neoplasm of liver and intrahepatic bile duct: C78.7

## 2017-12-25 HISTORY — DX: Secondary malignant neoplasm of brain: C79.31

## 2017-12-26 ENCOUNTER — Telehealth: Payer: Self-pay

## 2017-12-26 NOTE — Telephone Encounter (Signed)
-----   Message from Will Meredith Leeds, MD sent at 12/26/2017  4:18 PM EDT ----- MRI stable without acute abnormality

## 2017-12-26 NOTE — Telephone Encounter (Signed)
Notes recorded by Frederik Schmidt, RN on 12/26/2017 at 5:18 PM EDT The patient has been notified of the result and verbalized understanding. All questions (if any) were answered. Frederik Schmidt, RN 12/26/2017 5:18 PM

## 2018-01-02 ENCOUNTER — Telehealth: Payer: Self-pay | Admitting: Internal Medicine

## 2018-01-02 NOTE — Telephone Encounter (Signed)
New Message   Dorian Pod with [facility] is calling with an FYI  to advise that patient missed her nursing visit today and they will see her Monday. No call back is needed she just wanted to let the provide know.

## 2018-01-03 NOTE — Telephone Encounter (Signed)
Noted  

## 2018-01-06 ENCOUNTER — Other Ambulatory Visit (HOSPITAL_COMMUNITY): Payer: Self-pay | Admitting: Neurological Surgery

## 2018-01-06 DIAGNOSIS — C7949 Secondary malignant neoplasm of other parts of nervous system: Principal | ICD-10-CM

## 2018-01-06 DIAGNOSIS — C7931 Secondary malignant neoplasm of brain: Secondary | ICD-10-CM

## 2018-01-10 DIAGNOSIS — M7071 Other bursitis of hip, right hip: Secondary | ICD-10-CM | POA: Diagnosis not present

## 2018-01-17 DIAGNOSIS — G908 Other disorders of autonomic nervous system: Secondary | ICD-10-CM | POA: Diagnosis not present

## 2018-01-17 DIAGNOSIS — E119 Type 2 diabetes mellitus without complications: Secondary | ICD-10-CM | POA: Diagnosis not present

## 2018-01-17 DIAGNOSIS — I498 Other specified cardiac arrhythmias: Secondary | ICD-10-CM | POA: Diagnosis not present

## 2018-01-17 DIAGNOSIS — Z452 Encounter for adjustment and management of vascular access device: Secondary | ICD-10-CM | POA: Diagnosis not present

## 2018-01-17 DIAGNOSIS — R42 Dizziness and giddiness: Secondary | ICD-10-CM | POA: Diagnosis not present

## 2018-01-17 DIAGNOSIS — I69398 Other sequelae of cerebral infarction: Secondary | ICD-10-CM | POA: Diagnosis not present

## 2018-01-24 DIAGNOSIS — I498 Other specified cardiac arrhythmias: Secondary | ICD-10-CM | POA: Diagnosis not present

## 2018-01-24 DIAGNOSIS — E119 Type 2 diabetes mellitus without complications: Secondary | ICD-10-CM | POA: Diagnosis not present

## 2018-01-24 DIAGNOSIS — I69398 Other sequelae of cerebral infarction: Secondary | ICD-10-CM | POA: Diagnosis not present

## 2018-01-24 DIAGNOSIS — Z452 Encounter for adjustment and management of vascular access device: Secondary | ICD-10-CM | POA: Diagnosis not present

## 2018-01-24 DIAGNOSIS — G908 Other disorders of autonomic nervous system: Secondary | ICD-10-CM | POA: Diagnosis not present

## 2018-01-24 DIAGNOSIS — R42 Dizziness and giddiness: Secondary | ICD-10-CM | POA: Diagnosis not present

## 2018-01-25 DIAGNOSIS — Z452 Encounter for adjustment and management of vascular access device: Secondary | ICD-10-CM | POA: Diagnosis not present

## 2018-01-25 DIAGNOSIS — E119 Type 2 diabetes mellitus without complications: Secondary | ICD-10-CM | POA: Diagnosis not present

## 2018-01-25 DIAGNOSIS — I69398 Other sequelae of cerebral infarction: Secondary | ICD-10-CM | POA: Diagnosis not present

## 2018-01-25 DIAGNOSIS — G908 Other disorders of autonomic nervous system: Secondary | ICD-10-CM | POA: Diagnosis not present

## 2018-01-25 DIAGNOSIS — I498 Other specified cardiac arrhythmias: Secondary | ICD-10-CM | POA: Diagnosis not present

## 2018-01-25 DIAGNOSIS — R42 Dizziness and giddiness: Secondary | ICD-10-CM | POA: Diagnosis not present

## 2018-01-28 ENCOUNTER — Telehealth: Payer: Self-pay | Admitting: Internal Medicine

## 2018-01-28 DIAGNOSIS — I69398 Other sequelae of cerebral infarction: Secondary | ICD-10-CM | POA: Diagnosis not present

## 2018-01-28 DIAGNOSIS — R42 Dizziness and giddiness: Secondary | ICD-10-CM | POA: Diagnosis not present

## 2018-01-28 DIAGNOSIS — E119 Type 2 diabetes mellitus without complications: Secondary | ICD-10-CM | POA: Diagnosis not present

## 2018-01-28 DIAGNOSIS — I498 Other specified cardiac arrhythmias: Secondary | ICD-10-CM | POA: Diagnosis not present

## 2018-01-28 DIAGNOSIS — Z452 Encounter for adjustment and management of vascular access device: Secondary | ICD-10-CM | POA: Diagnosis not present

## 2018-01-28 DIAGNOSIS — G908 Other disorders of autonomic nervous system: Secondary | ICD-10-CM | POA: Diagnosis not present

## 2018-01-28 NOTE — Telephone Encounter (Signed)
° °  Nicky from [facility] calling to report services will be discontinued next week. She is concerned about hydration but does not have an available nurse to continue with the patient. Please Wellsite geologist at [facility] [phone]

## 2018-01-29 NOTE — Telephone Encounter (Signed)
[facility] home health states they are having difficulty finding IV starts on pt. They state pt will not allow them to access her [facility] area and other attempts in other areas have not been successful. [facility] states they will not be able to continue to service pt unless she allows an [facility] start or if she has port access.   I advise [facility] I would discuss with Dr Caryl Comes for recommendation.

## 2018-01-29 NOTE — Telephone Encounter (Signed)
LM for manager to call me back.

## 2018-01-30 DIAGNOSIS — M25572 Pain in left ankle and joints of left foot: Secondary | ICD-10-CM | POA: Diagnosis not present

## 2018-01-30 DIAGNOSIS — M79672 Pain in left foot: Secondary | ICD-10-CM | POA: Diagnosis not present

## 2018-02-04 DIAGNOSIS — Z6825 Body mass index (BMI) 25.0-25.9, adult: Secondary | ICD-10-CM | POA: Diagnosis not present

## 2018-02-04 DIAGNOSIS — K52831 Collagenous colitis: Secondary | ICD-10-CM | POA: Diagnosis not present

## 2018-02-07 DIAGNOSIS — I498 Other specified cardiac arrhythmias: Secondary | ICD-10-CM | POA: Diagnosis not present

## 2018-02-07 DIAGNOSIS — G908 Other disorders of autonomic nervous system: Secondary | ICD-10-CM | POA: Diagnosis not present

## 2018-02-07 DIAGNOSIS — R42 Dizziness and giddiness: Secondary | ICD-10-CM | POA: Diagnosis not present

## 2018-02-07 DIAGNOSIS — E119 Type 2 diabetes mellitus without complications: Secondary | ICD-10-CM | POA: Diagnosis not present

## 2018-02-07 DIAGNOSIS — I69398 Other sequelae of cerebral infarction: Secondary | ICD-10-CM | POA: Diagnosis not present

## 2018-02-07 DIAGNOSIS — Z452 Encounter for adjustment and management of vascular access device: Secondary | ICD-10-CM | POA: Diagnosis not present

## 2018-02-12 ENCOUNTER — Telehealth: Payer: Self-pay | Admitting: Internal Medicine

## 2018-02-12 NOTE — Telephone Encounter (Signed)
Attempted to call back, was placed on hold for > 10 min. Will attempt again tomorrow.

## 2018-02-12 NOTE — Telephone Encounter (Signed)
New message    [name] with [facility] is calling regarding the patients home health situation, they were expecting a call back and have not received a response

## 2018-02-13 ENCOUNTER — Telehealth: Payer: Self-pay | Admitting: Internal Medicine

## 2018-02-13 DIAGNOSIS — C7800 Secondary malignant neoplasm of unspecified lung: Secondary | ICD-10-CM | POA: Diagnosis not present

## 2018-02-13 DIAGNOSIS — C787 Secondary malignant neoplasm of liver and intrahepatic bile duct: Secondary | ICD-10-CM | POA: Diagnosis not present

## 2018-02-13 DIAGNOSIS — C4359 Malignant melanoma of other part of trunk: Secondary | ICD-10-CM | POA: Diagnosis not present

## 2018-02-13 DIAGNOSIS — K580 Irritable bowel syndrome with diarrhea: Secondary | ICD-10-CM | POA: Diagnosis not present

## 2018-02-13 DIAGNOSIS — F419 Anxiety disorder, unspecified: Secondary | ICD-10-CM | POA: Diagnosis not present

## 2018-02-13 DIAGNOSIS — R5383 Other fatigue: Secondary | ICD-10-CM | POA: Diagnosis not present

## 2018-02-13 DIAGNOSIS — K449 Diaphragmatic hernia without obstruction or gangrene: Secondary | ICD-10-CM | POA: Diagnosis not present

## 2018-02-13 DIAGNOSIS — Z9289 Personal history of other medical treatment: Secondary | ICD-10-CM | POA: Diagnosis not present

## 2018-02-13 DIAGNOSIS — J45909 Unspecified asthma, uncomplicated: Secondary | ICD-10-CM | POA: Diagnosis not present

## 2018-02-13 DIAGNOSIS — F411 Generalized anxiety disorder: Secondary | ICD-10-CM | POA: Diagnosis not present

## 2018-02-13 DIAGNOSIS — Z800 Family history of malignant neoplasm of digestive organs: Secondary | ICD-10-CM | POA: Diagnosis not present

## 2018-02-13 DIAGNOSIS — R21 Rash and other nonspecific skin eruption: Secondary | ICD-10-CM | POA: Diagnosis not present

## 2018-02-13 DIAGNOSIS — Z79899 Other long term (current) drug therapy: Secondary | ICD-10-CM | POA: Diagnosis not present

## 2018-02-13 DIAGNOSIS — G909 Disorder of the autonomic nervous system, unspecified: Secondary | ICD-10-CM | POA: Diagnosis not present

## 2018-02-13 DIAGNOSIS — Z91041 Radiographic dye allergy status: Secondary | ICD-10-CM | POA: Diagnosis not present

## 2018-02-13 DIAGNOSIS — J329 Chronic sinusitis, unspecified: Secondary | ICD-10-CM | POA: Diagnosis not present

## 2018-02-13 DIAGNOSIS — C439 Malignant melanoma of skin, unspecified: Secondary | ICD-10-CM | POA: Diagnosis not present

## 2018-02-13 DIAGNOSIS — Z6827 Body mass index (BMI) 27.0-27.9, adult: Secondary | ICD-10-CM | POA: Diagnosis not present

## 2018-02-13 DIAGNOSIS — Z950 Presence of cardiac pacemaker: Secondary | ICD-10-CM | POA: Diagnosis not present

## 2018-02-13 NOTE — Telephone Encounter (Signed)
Spoke with Lexine Baton who states pt became very upset today when she was told she would officially be discharged from her home health practice. Pt told Lexine Baton she would be contacting our office in regards to next steps.

## 2018-02-13 NOTE — Telephone Encounter (Signed)
  [name] would like for you to call her back

## 2018-02-13 NOTE — Telephone Encounter (Signed)
Follow up     Patient is returning your call. Please contact the patient after 4:00 pm on her cell phone #.

## 2018-02-13 NOTE — Telephone Encounter (Signed)
Disregard this encounter.

## 2018-02-13 NOTE — Telephone Encounter (Signed)
Left voicemail for pt to call back regarding her situation with home health infusion.

## 2018-02-13 NOTE — Telephone Encounter (Signed)
Follow up  ° ° °Patient is returning your call. °

## 2018-02-13 NOTE — Telephone Encounter (Signed)
No message needed °

## 2018-02-13 NOTE — Telephone Encounter (Signed)
Spoke with Lexine Baton from [facility] who states pt is aware of her discharge. She wanted to check if we will be referring her to a new home health agency. Pt declines to allow RN's visiting to start IV's at her [facility] site, which is the only vein attainable. Per Dr Caryl Comes, pt has no indication to start a PICC or port based of her POTS dx. If pt does not allow AC stick and RNs are unsuccessful in other areas, pt will not be able to have fluid therapy provided. In those regards, if pt is referred to a new home health agency, this will be a similar problem.   Per Lexine Baton, pt's last infusion was on Sept 20th and she has not requested an infusion since October. Lexine Baton states pt generally requests treatment q60days.   [facility] will be contacting pt regarding her discharge.

## 2018-02-14 ENCOUNTER — Telehealth: Payer: Self-pay | Admitting: Internal Medicine

## 2018-02-14 DIAGNOSIS — M79672 Pain in left foot: Secondary | ICD-10-CM | POA: Diagnosis not present

## 2018-02-14 NOTE — Telephone Encounter (Signed)
Dr Caryl Comes to address.

## 2018-02-14 NOTE — Telephone Encounter (Signed)
New message:   Patient calling back to speak with nurse and would like for some to call. Patient has a Dr. appt  At 11:30 please call.

## 2018-02-14 NOTE — Telephone Encounter (Signed)
Per Dr Caryl Comes, he will contact pt.

## 2018-02-17 ENCOUNTER — Telehealth: Payer: Self-pay | Admitting: Internal Medicine

## 2018-02-17 NOTE — Telephone Encounter (Signed)
New Message    Nicki calling from [facility] wanted to speak with you per your previous conversation regarding patient [name].

## 2018-02-18 NOTE — Telephone Encounter (Signed)
Per Dr Caryl Comes, pt understands there are no indications to order home health hydration at this time. Pt has been advised on a few different types of oral hydration supplements in the meantime. [facility] is aware and will discharge pt from their services.

## 2018-03-01 DIAGNOSIS — S0121XA Laceration without foreign body of nose, initial encounter: Secondary | ICD-10-CM | POA: Diagnosis not present

## 2018-03-01 DIAGNOSIS — Z91041 Radiographic dye allergy status: Secondary | ICD-10-CM | POA: Diagnosis not present

## 2018-03-01 DIAGNOSIS — Z882 Allergy status to sulfonamides status: Secondary | ICD-10-CM | POA: Diagnosis not present

## 2018-03-01 DIAGNOSIS — S80812A Abrasion, left lower leg, initial encounter: Secondary | ICD-10-CM | POA: Diagnosis not present

## 2018-03-01 DIAGNOSIS — E119 Type 2 diabetes mellitus without complications: Secondary | ICD-10-CM | POA: Diagnosis not present

## 2018-03-01 DIAGNOSIS — S0033XA Contusion of nose, initial encounter: Secondary | ICD-10-CM | POA: Diagnosis not present

## 2018-03-01 DIAGNOSIS — Z79899 Other long term (current) drug therapy: Secondary | ICD-10-CM | POA: Diagnosis not present

## 2018-03-01 DIAGNOSIS — C439 Malignant melanoma of skin, unspecified: Secondary | ICD-10-CM | POA: Diagnosis not present

## 2018-03-01 DIAGNOSIS — Z86718 Personal history of other venous thrombosis and embolism: Secondary | ICD-10-CM | POA: Diagnosis not present

## 2018-03-01 DIAGNOSIS — S0992XA Unspecified injury of nose, initial encounter: Secondary | ICD-10-CM | POA: Diagnosis not present

## 2018-03-01 DIAGNOSIS — I10 Essential (primary) hypertension: Secondary | ICD-10-CM | POA: Diagnosis not present

## 2018-03-01 DIAGNOSIS — I4891 Unspecified atrial fibrillation: Secondary | ICD-10-CM | POA: Diagnosis not present

## 2018-03-01 DIAGNOSIS — Z8673 Personal history of transient ischemic attack (TIA), and cerebral infarction without residual deficits: Secondary | ICD-10-CM | POA: Diagnosis not present

## 2018-03-01 DIAGNOSIS — Z880 Allergy status to penicillin: Secondary | ICD-10-CM | POA: Diagnosis not present

## 2018-03-01 DIAGNOSIS — Z800 Family history of malignant neoplasm of digestive organs: Secondary | ICD-10-CM | POA: Diagnosis not present

## 2018-03-01 DIAGNOSIS — M79652 Pain in left thigh: Secondary | ICD-10-CM | POA: Diagnosis not present

## 2018-03-21 ENCOUNTER — Telehealth: Payer: Self-pay | Admitting: Internal Medicine

## 2018-03-21 DIAGNOSIS — R5383 Other fatigue: Secondary | ICD-10-CM | POA: Diagnosis not present

## 2018-03-21 DIAGNOSIS — C787 Secondary malignant neoplasm of liver and intrahepatic bile duct: Secondary | ICD-10-CM | POA: Diagnosis not present

## 2018-03-21 DIAGNOSIS — R1011 Right upper quadrant pain: Secondary | ICD-10-CM | POA: Diagnosis not present

## 2018-03-21 DIAGNOSIS — K8020 Calculus of gallbladder without cholecystitis without obstruction: Secondary | ICD-10-CM | POA: Diagnosis not present

## 2018-03-21 NOTE — Telephone Encounter (Signed)
Pt left a message to see if she can get her device checked with Caryl Comes,  per her Oncologist. I didn't know if we can just do a check or does it have to be with him? She is also requesting a Cardiovascular work up, who should that be with? Pls advise [phone]

## 2018-03-21 NOTE — Telephone Encounter (Signed)
Called and spoke to patient. She states that she had a syncopal episode on 03/01/18 and her oncologist Dr. Harriet Masson at [facility] would like for her to see Dr. Caryl Comes for a device check and a workup. Patient has Biotronik PPM and cannot send in a transmission. She states that she has been fatigued and weak and that Dr. Caryl Comes sees her for her autonomic dysfunction and she would like an appointment with him. Discussed with Melissa, EP Scheduler and appointment was made for 03/25/18 at 2:30 PM.

## 2018-03-24 ENCOUNTER — Other Ambulatory Visit: Payer: Self-pay | Admitting: Internal Medicine

## 2018-03-25 ENCOUNTER — Encounter: Payer: Self-pay | Admitting: Internal Medicine

## 2018-03-25 ENCOUNTER — Ambulatory Visit (INDEPENDENT_AMBULATORY_CARE_PROVIDER_SITE_OTHER): Payer: Medicare Other | Admitting: Internal Medicine

## 2018-03-25 VITALS — BP 122/80 | HR 64 | Ht 63.0 in | Wt 150.0 lb

## 2018-03-25 DIAGNOSIS — G901 Familial dysautonomia [Riley-Day]: Secondary | ICD-10-CM

## 2018-03-25 DIAGNOSIS — Z950 Presence of cardiac pacemaker: Secondary | ICD-10-CM

## 2018-03-25 DIAGNOSIS — I471 Supraventricular tachycardia: Secondary | ICD-10-CM

## 2018-03-25 DIAGNOSIS — I495 Sick sinus syndrome: Secondary | ICD-10-CM | POA: Diagnosis not present

## 2018-03-25 NOTE — Progress Notes (Signed)
Patient Care Team: Panosh, Standley Brooking, MD as PCP - Lisa Roca, MD as Consulting Physician (Neurosurgery) [name], MD as Consulting Physician (Oncology) [name], MD as Referring Physician (Internal Medicine) Jodi Marble, MD as Consulting Physician (Otolaryngology)   HPI  [name] is a 63 y.o. female Seen for device interrogation following cranial radiation recently identified metastatic melanoma.  Her biggest issues today relates to the handling of the loss of her home health care support. She brings in documentation that she received from [facility]  office regarding the appropriate procedure for discontinuation of care.    She has a history of a pacemaker for sinus node dysfunction in the setting of dysautonomia GI symptoms have be queiscient; able to take in fluids  She needs preoperative clearance for cholecystectomy.  She is struggled with nausea.  Using liquid IV to help with hydration.  No significant lightheadedness..    Past Medical History:  Diagnosis Date  . Asthma   . Atrial fibrillation ([place])   . Atrial tachycardia ([place])   . Cervicalgia   . Complication of anesthesia    pt states wakes up with "shakes"  . CVA (cerebral infarction)    2012 with dizziness and vision change felt embolic from atrial tachy  . Disorder of bone and cartilage, unspecified   . Disturbance of skin sensation   . Diverticulosis   . DM (diabetes mellitus) ([place])   . DVT (deep venous thrombosis) ([place])   . DVT (deep venous thrombosis) ([place])   . Family history of anesthesia complication    PONV  . History of pacemaker   . History of radiation therapy 10/24/12   brain  . HLD (hyperlipidemia)   . HTN (hypertension)   . Hypoglycemia, unspecified   . 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 Liver metastasis ([place])   . Lung metastasis ([place])   . Metastasis to brain ([place])   . Osteopenia   . Other nonspecific abnormal serum  enzyme levels   . Other specified congenital anomalies of nervous system   . Pacemaker    autonomic dysfunction  . Pancreatitis    from therapy  . PONV (postoperative nausea and vomiting)   . POTS (postural orthostatic tachycardia syndrome)   . Skin cancer    Hx: of lung lesion  . Stroke [facility])    left weaker   . Swelling of limb     Past Surgical History:  Procedure Laterality Date  . ABLATION SAPHENOUS VEIN W/ RFA     2002  . CARPAL TUNNEL RELEASE Left   . CRANIOTOMY N/A 09/30/2012   suboccipital craniectomy  . CRANIOTOMY Left 02/09/2013   Procedure: LEFT PARIETAL CRANIOTOMY with stealth;  Surgeon: [name], MD;  Location: [facility];  Service: Neurosurgery;  Laterality: Left;  LEFT Parietal Craniotomy for tumor with stealth  . DEEP AXILLARY SENTINEL NODE BIOPSY / EXCISION     due to extensive Melanoma-right arm  . fatty tumor removed     from chest  . INSERT / REPLACE / REMOVE PACEMAKER  2012   1999, x 3  . LAPAROSCOPY  09/06/2011   Procedure: LAPAROSCOPY OPERATIVE;  Surgeon: Claiborne Billings A. Pamala Hurry, MD;  Location: [facility];  Service: Gynecology;  Laterality: Left;  with Left Ovarian Cystectomy   . MELANOMA EXCISION     with removal of lymph nodes, left shoulder  . NOSE SURGERY  2010, 2012   for nose bleeds x 2  .  PACEMAKER IMPLANT    . TONSILLECTOMY AND ADENOIDECTOMY      Current Outpatient Medications  Medication Sig Dispense Refill  . acetaminophen (TYLENOL) 500 MG tablet Take 500 mg by mouth every 6 (six) hours as needed for pain.     Marland Kitchen azithromycin (ZITHROMAX) 250 MG tablet Take as directed per package 6 tablet 0  . baclofen (LIORESAL) 10 MG tablet TAKE 0.5 TABLETS BY MOUTH 2 TIMES A DAY AS NEEDED FOR MUSCLE SPASMS.  3  . benzonatate (TESSALON) 200 MG capsule Take one cap by mouth at bedtime as needed for cough.  May repeat in 4 to 6 hours 15 capsule 0  . budesonide (ENTOCORT EC) 3 MG 24 hr capsule Take 9 mg by mouth.    . cefUROXime (CEFTIN) 500 MG tablet Take 1  tablet (500 mg total) by mouth 2 (two) times daily with a meal. 20 tablet 0  . clonazePAM (KLONOPIN) 1 MG tablet TAKE 1 TABLET BY MOUTH TWO (2) TIMES A DAY.  3  . cloNIDine (CATAPRES) 0.1 MG tablet Take 1 tablet (0.1 mg total) by mouth at bedtime. 90 tablet 3  . CREON 36000 units CPEP capsule     . cyanocobalamin (,VITAMIN B-12,) 1000 MCG/ML injection INJECT 1 ML (1,000 MCG TOTAL) INTO THE SKIN EVERY 30 (THIRTY) DAYS. **NOT COVERED UNDER PART D** 1 mL 9  . dicyclomine (BENTYL) 10 MG capsule TAKE 1 CAPSULE (10 MG TOTAL) BY MOUTH FOUR (4) TIMES A DAY (BEFORE MEALS AND NIGHTLY).  11  . diphenhydrAMINE (BENADRYL) 25 MG tablet Take 12.5 mg by mouth every 6 (six) hours as needed. Seasonal allergies    . esomeprazole (NEXIUM) 40 MG capsule TAKE ONE CAPSULE BY MOUTH TWICE A DAY BEFORE MEALS *PT WANTS 90 DAY SUPPLY* 180 capsule 0  . fluorometholone (FML) 0.1 % ophthalmic suspension 1 DROP INTO BOTH EYES AS DIRECTED 4X DAILY X 2 WEEKS THEN TWICE DAILY X 2 WEEKS  1  . furosemide (LASIX) 40 MG tablet Take 20 mg by mouth daily. Takes PRN    . hydrocortisone (ANUSOL-HC) 25 MG suppository Place 1 suppository (25 mg total) rectally at bedtime as needed for hemorrhoids. 12 suppository 0  . hydrocortisone 2.5 % cream Apply 1 application topically 2 (two) times daily as needed (irritation). 30 g 0  . levETIRAcetam (KEPPRA) 500 MG tablet Take 500 mg by mouth 2 (two) times daily.     Marland Kitchen LOTEMAX 0.5 % ophthalmic suspension 1 DROP EVERY 2 HOURS TO BOTH EYES X 1 DAY, THEN 4 TIMES DAILY X 1 WEEK THEN TWICE DAILY X 1 WEEK  0  . mesalamine (CANASA) 1000 MG suppository Place 1 suppository (1,000 mg total) rectally at bedtime as needed (hemorrhoids). 30 suppository 0  . metFORMIN (GLUCOPHAGE) 500 MG tablet Take 500 mg by mouth daily with breakfast.  3  . metoprolol tartrate (LOPRESSOR) 25 MG tablet TAKE 1 TABLET (25 MG TOTAL) BY MOUTH 2 (TWO) TIMES DAILY. 180 tablet 2  . metroNIDAZOLE (FLAGYL) 500 MG tablet TAKE 1 TABLET (500 MG  TOTAL) BY MOUTH THREE (3) TIMES A DAY. FOR 7 DAYS  0  . potassium chloride SA (K-DUR,KLOR-CON) 20 MEQ tablet Take 1 tablet (20 mEq total) by mouth 2 (two) times daily. 60 tablet 3  . Syringe/Needle, Disp, (SYRINGE 3CC/27GX1-1/4") 27G X 1-1/4" 3 ML MISC Inject 1 mL into the muscle every 30 (thirty) days. 12 each 0  . VENTOLIN HFA 108 (90 Base) MCG/ACT inhaler INHALE 2 PUFFS INTO THE LUNGS EVERY 4 (  FOUR) HOURS AS NEEDED FOR WHEEZING OR SHORTNESS OF BREATH. 18 Inhaler 0  . Vitamin D, Ergocalciferol, (DRISDOL) 50000 units CAPS capsule TAKE 1 CAPSULE (50,000 UNITS TOTAL) BY MOUTH EVERY 7 (SEVEN) DAYS. 4 capsule 11   No current facility-administered medications for this visit.     Allergies  Allergen Reactions  . Doxycycline Hives  . Erythromycin Hives  . Gadavist [Gadobutrol] Hives, Shortness Of Breath and Itching  . Other Hives    Cannot use  Cream or Suppositories  . Penicillins Hives  . Sulfa Antibiotics Hives  . Preparation H [Pramox-Pe-Glycerin-Petrolatum] Hives    Cannot use  Cream or Suppositories   . Promethazine     Other reaction(s): Other (See Comments) Causes restless leg syndrome Other reaction(s): Other (See Comments) Causes restless leg syndrome  . Sulfonamide Derivatives Hives  . Phenergan [Promethazine Hcl] Other (See Comments)    Causes restless leg syndrome    Review of Systems negative except from HPI and PMH  Physical Exam BP 122/80   Pulse 64   Ht 5\' 3"  (1.6 m)   Wt 150 lb (68 kg)   BMI 26.57 kg/m  Well developed and well nourished in no acute distress HENT normal Neck supple with JVP-flat Clear Device pocket well healed; without hematoma or erythema.  There is no tethering  Regular rate and rhythm, no  gallop No murmur Abd-soft with active BS No Clubbing cyanosis   edema Skin-warm and dry A & Oriented  Grossly normal sensory and motor function     ECG  A paced 64 19/07/38   Assessment and  Plan  Atrial  Tach  Pacemaker Biotronik  Normal  function   Dysautonomia   Sinus node dysfunction   Pancreatic Insufficiency of some mechanical kind  Insignificant atrial tachycardia.  Continue oral rehydration.  Needs cholecystectomy.  Should be at acceptable surgical risk with attention to fluids.    We spent more than 50% of our >25 min visit in face to face counseling regarding the above

## 2018-03-25 NOTE — Patient Instructions (Signed)

## 2018-03-26 DIAGNOSIS — K8010 Calculus of gallbladder with chronic cholecystitis without obstruction: Secondary | ICD-10-CM | POA: Diagnosis not present

## 2018-03-26 LAB — CUP PACEART INCLINIC DEVICE CHECK
Date Time Interrogation Session: 20200128194400
Implantable Lead Implant Date: 19990303
Implantable Lead Implant Date: 19990303
Implantable Lead Location: 753859
Implantable Lead Location: 753860
Implantable Lead Model: 5092
Implantable Pulse Generator Implant Date: 20120905
Lead Channel Impedance Value: 370 Ohm
Lead Channel Impedance Value: 390 Ohm
Lead Channel Pacing Threshold Amplitude: 0.8 V
Lead Channel Pacing Threshold Amplitude: 0.8 V
Lead Channel Pacing Threshold Amplitude: 1.4 V
Lead Channel Pacing Threshold Amplitude: 1.4 V
Lead Channel Pacing Threshold Amplitude: 1.6 V
Lead Channel Pacing Threshold Amplitude: 1.7 V
Lead Channel Pacing Threshold Amplitude: 2.6 V
Lead Channel Pacing Threshold Amplitude: 3.1 V
Lead Channel Pacing Threshold Pulse Width: 0.4 ms
Lead Channel Pacing Threshold Pulse Width: 0.4 ms
Lead Channel Pacing Threshold Pulse Width: 1 ms
Lead Channel Pacing Threshold Pulse Width: 1 ms
Lead Channel Pacing Threshold Pulse Width: 1 ms
Lead Channel Pacing Threshold Pulse Width: 1 ms
Lead Channel Pacing Threshold Pulse Width: 1 ms
Lead Channel Pacing Threshold Pulse Width: 1 ms
Lead Channel Sensing Intrinsic Amplitude: 6.2 mV
Lead Channel Sensing Intrinsic Amplitude: 6.2 mV
Lead Channel Sensing Intrinsic Amplitude: 6.6 mV
Lead Channel Setting Pacing Amplitude: 2 V
Lead Channel Setting Pacing Amplitude: 3.4 V
Lead Channel Setting Pacing Pulse Width: 1 ms
Pulse Gen Serial Number: 66195584

## 2018-03-26 NOTE — H&P (Addendum)
[name] Documented: 03/26/2018 1:52 PM Location: [place] Patient #: [id number] DOB: 05-Feb-1956 Married / Language: English / Race: White Female   History of Present Illness Rodman Key B. Hassell Done MD; 03/26/2018 2:43 PM) The patient is a 63 year old female who presents for evaluation of gall stones. [name] has had symptoms of food intolerance and right upper quadrant pain and back pain. Her immunotherapy for metastatic melanoma was thought to be causing the pain but a recent cT and ultrasound shows multiple gallstones up to 9 mm with no pericholecystic fluid or GB wall thickening. I have discussed this with her and her sister. Will proceed with scheduling for lap chole. She has autonomic dysfunction that requires her to stay well hydrated. Will keep overnight. Her medical oncologist is [facility] Har[facility]. She has had an outstanding response from her immunotherapy for metastatic melanoma.      Allergies Malachi Bonds, CMA; 03/26/2018 1:52 PM) Doxycycline *DERMATOLOGICALS*  Erythromycin *DERMATOLOGICALS*  Penicillin G Potassium *PENICILLINS*  Sulfacetamide *CHEMICALS*  Preparation H *ANORECTAL AGENTS*  Promethazine HCl *ANTIHISTAMINES*  Triple Sulfonamides *SULFONAMIDES*  Phenergan *ANTIHISTAMINES*   Medication History (Chemira Jones, CMA; 03/26/2018 1:58 PM) Alrex (0.2% Suspension, Ophthalmic) Active. Budesonide (3MG  Capsule DR Part, Oral) Active. clonazePAM (1MG  Tablet, Oral) Active. cloNIDine HCl (0.1MG  Tablet, Oral) Active. Cyanocobalamin (1000MCG/ML Solution, Injection) Active. Desipramine HCl (25MG  Tablet, Oral) Active. Desoximetasone (0.05% Cream, External) Active. Dicyclomine HCl (10MG  Capsule, Oral) Active. diphenhydrAMINE HCl (25MG  Capsule, Oral) Active. Drisdol (50000UNIT Capsule, Oral) Active. Fish Oil (1000MG  Capsule, Oral) Active. Fluorometholone (0.1% Suspension, Ophthalmic) Active. Metoprolol Tartrate (25MG  Tablet, Oral)  Active. Hyoscyamine Sulfate (0.125MG  Tab Sublingual, Sublingual) Active. Nystatin (100000 UNIT/ML Suspension, Mouth/Throat) Active. Latisse (0.03% Solution, External) Active. MiraLax (Oral) Active. Klor-Con M20 [facility] Tablet ER, Oral) Active. levETIRAcetam (250MG  Tablet, Oral) Active. Lotemax (0.5% Suspension, Ophthalmic) Active. Vitamin D (Ergocalciferol) (50000UNIT Capsule, Oral) Active. Ventolin HFA (108 (90 Base)MCG/ACT Aerosol Soln, Inhalation) Active. Medications Reconciled  Vitals (Chemira Jones CMA; 03/26/2018 1:52 PM) 03/26/2018 1:52 PM Weight: 151.4 lb Height: 63in Body Surface Area: 1.72 m Body Mass Index: 26.82 kg/m  BP: 110/70 (Sitting, Left Arm, Standard)  Physical Exam (Lorn Butcher B. Hassell Done MD; 03/26/2018 2:44 PM) General Note: Thin WF HEENT prior craniotomy for metastatic melanoma Neck supple Chest clear Heart pacemaker in place-saw Jolyn Nap yesterday and her cleared her for surgery. Abdomen nontender at present  Assessment & Plan Rodman Key B. Hassell Done MD; 03/26/2018 2:48 PM) CHRONIC CHOLECYSTITIS WITH CALCULUS (K80.10)  Lap Chole with IOC  Matt B. Hassell Done, MD, FACS Impression: Plan lap chole IOC on Friday at [facility].

## 2018-03-27 ENCOUNTER — Encounter (HOSPITAL_COMMUNITY): Payer: Self-pay | Admitting: *Deleted

## 2018-03-27 ENCOUNTER — Other Ambulatory Visit: Payer: Self-pay

## 2018-03-27 NOTE — Anesthesia Preprocedure Evaluation (Addendum)
Anesthesia Evaluation  Patient identified by MRN, date of birth, ID band Patient awake    Reviewed: Allergy & Precautions, NPO status , Patient's Chart, lab work & pertinent test results  History of Anesthesia Complications (+) PONV  Airway Mallampati: II  TM Distance: >3 FB Neck ROM: Full    Dental  (+) Teeth Intact, Dental Advisory Given   Pulmonary asthma ,    Pulmonary exam normal breath sounds clear to auscultation       Cardiovascular hypertension, Pt. on medications and Pt. on home beta blockers + Peripheral Vascular Disease  Normal cardiovascular exam+ pacemaker  Rhythm:Regular Rate:Normal  TTE 10/14: EF 92-95%, grade 2 diastolic dysfunction, mild MR   Neuro/Psych Anxiety CVA, Residual Symptoms    GI/Hepatic Neg liver ROS, hiatal hernia, GERD  Poorly Controlled,  Endo/Other  diabetes, Type 2  Renal/GU negative Renal ROS     Musculoskeletal  (+) Arthritis ,   Abdominal   Peds  Hematology negative hematology ROS (+)   Anesthesia Other Findings Day of surgery medications reviewed with the patient.  Reproductive/Obstetrics                            Anesthesia Physical Anesthesia Plan  ASA: III  Anesthesia Plan: General   Post-op Pain Management:    Induction: Intravenous, Cricoid pressure planned and Rapid sequence  PONV Risk Score and Plan: 4 or greater and Treatment may vary due to age or medical condition, Ondansetron, Dexamethasone, Midazolam and Scopolamine patch - Pre-op  Airway Management Planned: Oral ETT  Additional Equipment:   Intra-op Plan:   Post-operative Plan: Extubation in OR  Informed Consent: I have reviewed the patients History and Physical, chart, labs and discussed the procedure including the risks, benefits and alternatives for the proposed anesthesia with the patient or authorized representative who has indicated his/her understanding and  acceptance.     Dental advisory given  Plan Discussed with: CRNA  Anesthesia Plan Comments:        Anesthesia Quick Evaluation

## 2018-03-27 NOTE — Progress Notes (Signed)
Spoke with pt for pre-op call. Pt has extensive medical history with hx of A-fib, pacemaker because of sinus node dysfunction in dysautonomia, stroke, melanoma with mets to brain and other organs. Pt's cardiologist is Dr. Caryl Comes, last office visit was 03/25/18 and pt was given cardiac clearance. Pt is a type 2 diabetic. Pt doesn't know what her last A1C is and said that Dr. Regis Bill is the one who checks it. There is no A1C in Epic. The last one in Care everywhere was 5.7 in 2018. Pt states she is no longer on Metformin and rarely checks her blood sugar. Instructed pt to check her blood sugar in the AM when she gets up.  If blood sugar is 70 or below, treat with 1/2 cup of clear juice (apple or cranberry) and recheck blood sugar 15 minutes after drinking juice. She voiced understanding.  Pt states she is to be admitted after surgery due to her medical history and wanted me to call Dr. Earlie Server office and have them change it from Ambulatory to admission. I did call Dr. Earlie Server office and spoke with the triage nurse, Mardene Celeste. She states she will send Dr. Hassell Done a message about this concern.

## 2018-03-28 ENCOUNTER — Ambulatory Visit (HOSPITAL_COMMUNITY): Payer: Medicare Other

## 2018-03-28 ENCOUNTER — Ambulatory Visit (HOSPITAL_COMMUNITY): Payer: Medicare Other | Admitting: Anesthesiology

## 2018-03-28 ENCOUNTER — Encounter (HOSPITAL_COMMUNITY): Payer: Self-pay | Admitting: General Practice

## 2018-03-28 ENCOUNTER — Other Ambulatory Visit: Payer: Self-pay

## 2018-03-28 ENCOUNTER — Observation Stay (HOSPITAL_COMMUNITY)
Admission: RE | Admit: 2018-03-28 | Discharge: 2018-03-29 | Disposition: A | Discharge status: Home / Self Care | Payer: Medicare Other | Attending: Surgery | Admitting: Surgery

## 2018-03-28 ENCOUNTER — Encounter (HOSPITAL_COMMUNITY)
Admission: RE | Disposition: A | Discharge status: Home / Self Care | Payer: Self-pay | Source: Home / Self Care | Attending: Surgery

## 2018-03-28 DIAGNOSIS — I6930 Unspecified sequelae of cerebral infarction: Secondary | ICD-10-CM | POA: Insufficient documentation

## 2018-03-28 DIAGNOSIS — E119 Type 2 diabetes mellitus without complications: Secondary | ICD-10-CM | POA: Insufficient documentation

## 2018-03-28 DIAGNOSIS — Z950 Presence of cardiac pacemaker: Secondary | ICD-10-CM | POA: Diagnosis not present

## 2018-03-28 DIAGNOSIS — Z880 Allergy status to penicillin: Secondary | ICD-10-CM | POA: Diagnosis not present

## 2018-03-28 DIAGNOSIS — Z882 Allergy status to sulfonamides status: Secondary | ICD-10-CM | POA: Diagnosis not present

## 2018-03-28 DIAGNOSIS — C7989 Secondary malignant neoplasm of other specified sites: Secondary | ICD-10-CM | POA: Diagnosis not present

## 2018-03-28 DIAGNOSIS — I739 Peripheral vascular disease, unspecified: Secondary | ICD-10-CM | POA: Insufficient documentation

## 2018-03-28 DIAGNOSIS — K660 Peritoneal adhesions (postprocedural) (postinfection): Secondary | ICD-10-CM | POA: Diagnosis not present

## 2018-03-28 DIAGNOSIS — K8010 Calculus of gallbladder with chronic cholecystitis without obstruction: Principal | ICD-10-CM | POA: Insufficient documentation

## 2018-03-28 DIAGNOSIS — J45909 Unspecified asthma, uncomplicated: Secondary | ICD-10-CM | POA: Diagnosis not present

## 2018-03-28 DIAGNOSIS — Z888 Allergy status to other drugs, medicaments and biological substances status: Secondary | ICD-10-CM | POA: Diagnosis not present

## 2018-03-28 DIAGNOSIS — F458 Other somatoform disorders: Secondary | ICD-10-CM | POA: Insufficient documentation

## 2018-03-28 DIAGNOSIS — Z79899 Other long term (current) drug therapy: Secondary | ICD-10-CM | POA: Insufficient documentation

## 2018-03-28 DIAGNOSIS — Z9049 Acquired absence of other specified parts of digestive tract: Secondary | ICD-10-CM

## 2018-03-28 DIAGNOSIS — C7931 Secondary malignant neoplasm of brain: Secondary | ICD-10-CM | POA: Insufficient documentation

## 2018-03-28 DIAGNOSIS — C439 Malignant melanoma of skin, unspecified: Secondary | ICD-10-CM | POA: Insufficient documentation

## 2018-03-28 DIAGNOSIS — K219 Gastro-esophageal reflux disease without esophagitis: Secondary | ICD-10-CM | POA: Insufficient documentation

## 2018-03-28 DIAGNOSIS — K811 Chronic cholecystitis: Secondary | ICD-10-CM | POA: Diagnosis not present

## 2018-03-28 DIAGNOSIS — I10 Essential (primary) hypertension: Secondary | ICD-10-CM | POA: Diagnosis not present

## 2018-03-28 DIAGNOSIS — K839 Disease of biliary tract, unspecified: Secondary | ICD-10-CM | POA: Diagnosis not present

## 2018-03-28 DIAGNOSIS — Z7951 Long term (current) use of inhaled steroids: Secondary | ICD-10-CM | POA: Insufficient documentation

## 2018-03-28 DIAGNOSIS — Z419 Encounter for procedure for purposes other than remedying health state, unspecified: Secondary | ICD-10-CM

## 2018-03-28 HISTORY — DX: Unspecified osteoarthritis, unspecified site: M19.90

## 2018-03-28 HISTORY — DX: Fatty (change of) liver, not elsewhere classified: K76.0

## 2018-03-28 HISTORY — DX: Gastro-esophageal reflux disease without esophagitis: K21.9

## 2018-03-28 HISTORY — PX: CHOLECYSTECTOMY: SHX55

## 2018-03-28 HISTORY — DX: Peripheral vascular disease, unspecified: I73.9

## 2018-03-28 HISTORY — DX: Personal history of other diseases of the digestive system: Z87.19

## 2018-03-28 HISTORY — DX: Pneumonia, unspecified organism: J18.9

## 2018-03-28 LAB — CBC
HCT: 37.9 % (ref 36.0–46.0)
Hemoglobin: 13.5 g/dL (ref 12.0–15.0)
MCH: 29.6 pg (ref 26.0–34.0)
MCHC: 35.6 g/dL (ref 30.0–36.0)
MCV: 83.1 fL (ref 80.0–100.0)
Platelets: 225 10*3/uL (ref 150–400)
RBC: 4.56 MIL/uL (ref 3.87–5.11)
RDW: 11.9 % (ref 11.5–15.5)
WBC: 6 10*3/uL (ref 4.0–10.5)
nRBC: 0 % (ref 0.0–0.2)

## 2018-03-28 LAB — LIPASE, BLOOD: Lipase: 29 U/L (ref 11–51)

## 2018-03-28 LAB — GLUCOSE, CAPILLARY
Glucose-Capillary: 84 mg/dL (ref 70–99)
Glucose-Capillary: 123 mg/dL — ABNORMAL HIGH (ref 70–99)

## 2018-03-28 LAB — CREATININE, SERUM
Creatinine, Ser: 0.92 mg/dL (ref 0.44–1.00)
GFR calc Af Amer: >60 mL/min (ref >60)
GFR calc non Af Amer: >60 mL/min (ref >60)

## 2018-03-28 SURGERY — LAPAROSCOPIC CHOLECYSTECTOMY WITH INTRAOPERATIVE CHOLANGIOGRAM
Anesthesia: General | Site: Abdomen

## 2018-03-28 MED ORDER — 0.9 % SODIUM CHLORIDE (POUR BTL) OPTIME
TOPICAL | Status: DC | PRN
  Administered 2018-03-28: 1000 mL

## 2018-03-28 MED ORDER — PHENYLEPHRINE 40 MCG/ML (10ML) SYRINGE FOR IV PUSH (FOR BLOOD PRESSURE SUPPORT)
PREFILLED_SYRINGE | INTRAVENOUS | Status: AC
  Filled 2018-03-28: qty 10

## 2018-03-28 MED ORDER — ROCURONIUM BROMIDE 50 MG/5ML IV SOSY
PREFILLED_SYRINGE | INTRAVENOUS | Status: DC | PRN
  Administered 2018-03-28: 40 mg via INTRAVENOUS

## 2018-03-28 MED ORDER — ONDANSETRON HCL 4 MG/2ML IJ SOLN
INTRAMUSCULAR | Status: DC | PRN
  Administered 2018-03-28: 4 mg via INTRAVENOUS

## 2018-03-28 MED ORDER — HYDROCORTISONE ACETATE 25 MG RE SUPP: 25.0000 mg | Freq: Every evening | RECTAL | Status: DC | PRN

## 2018-03-28 MED ORDER — BUPIVACAINE HCL (PF) 0.25 % IJ SOLN
INTRAMUSCULAR | Status: AC
  Filled 2018-03-28: qty 30

## 2018-03-28 MED ORDER — CIPROFLOXACIN IN D5W 400 MG/200ML IV SOLN
400.0000 mg | Freq: Two times a day (BID) | INTRAVENOUS | Status: AC
  Administered 2018-03-28: 400 mg via INTRAVENOUS
  Filled 2018-03-28: qty 200

## 2018-03-28 MED ORDER — ROCURONIUM BROMIDE 50 MG/5ML IV SOSY
PREFILLED_SYRINGE | INTRAVENOUS | Status: AC
  Filled 2018-03-28: qty 5

## 2018-03-28 MED ORDER — BUDESONIDE 3 MG PO CPEP
6.0000 mg | ORAL_CAPSULE | Freq: Every day | ORAL | Status: DC
  Administered 2018-03-28 – 2018-03-29 (×2): 6 mg via ORAL
  Filled 2018-03-28 (×3): qty 2

## 2018-03-28 MED ORDER — PROPOFOL 10 MG/ML IV BOLUS
INTRAVENOUS | Status: DC | PRN
  Administered 2018-03-28: 120 mg via INTRAVENOUS

## 2018-03-28 MED ORDER — POLYETHYLENE GLYCOL 3350 17 G PO PACK
17.0000 g | PACK | Freq: Every day | ORAL | Status: DC | PRN
  Filled 2018-03-28: qty 1

## 2018-03-28 MED ORDER — FLUOROMETHOLONE 0.1 % OP SUSP
1.0000 [drp] | Freq: Four times a day (QID) | OPHTHALMIC | Status: DC | PRN
  Filled 2018-03-28: qty 5

## 2018-03-28 MED ORDER — CHLORHEXIDINE GLUCONATE CLOTH 2 % EX PADS: 6.0000 | MEDICATED_PAD | Freq: Once | CUTANEOUS | Status: DC

## 2018-03-28 MED ORDER — SUGAMMADEX SODIUM 200 MG/2ML IV SOLN
INTRAVENOUS | Status: DC | PRN
  Administered 2018-03-28: 100 mg via INTRAVENOUS

## 2018-03-28 MED ORDER — LIDOCAINE 2% (20 MG/ML) 5 ML SYRINGE
INTRAMUSCULAR | Status: DC | PRN
  Administered 2018-03-28: 60 mg via INTRAVENOUS

## 2018-03-28 MED ORDER — CLONAZEPAM 1 MG PO TABS
1.0000 mg | ORAL_TABLET | Freq: Two times a day (BID) | ORAL | Status: DC
  Administered 2018-03-28 – 2018-03-29 (×2): 1 mg via ORAL
  Filled 2018-03-28 (×2): qty 1

## 2018-03-28 MED ORDER — CALCIUM CARBONATE ANTACID 500 MG PO CHEW: 1.0000 | CHEWABLE_TABLET | Freq: Every day | ORAL | Status: DC | PRN

## 2018-03-28 MED ORDER — GABAPENTIN 300 MG PO CAPS
300.0000 mg | ORAL_CAPSULE | ORAL | Status: AC
  Administered 2018-03-28: 300 mg via ORAL
  Filled 2018-03-28: qty 1

## 2018-03-28 MED ORDER — OXYCODONE HCL 5 MG/5ML PO SOLN: 5.0000 mg | Freq: Once | ORAL | Status: DC | PRN

## 2018-03-28 MED ORDER — PANTOPRAZOLE SODIUM 40 MG PO TBEC
40.0000 mg | DELAYED_RELEASE_TABLET | Freq: Every day | ORAL | Status: DC
  Administered 2018-03-28 – 2018-03-29 (×2): 40 mg via ORAL
  Filled 2018-03-28 (×2): qty 1

## 2018-03-28 MED ORDER — KCL IN DEXTROSE-NACL 20-5-0.45 MEQ/L-%-% IV SOLN
INTRAVENOUS | Status: DC
  Administered 2018-03-28: 12:00:00 via INTRAVENOUS
  Filled 2018-03-28: qty 1000

## 2018-03-28 MED ORDER — LIDOCAINE 2% (20 MG/ML) 5 ML SYRINGE
INTRAMUSCULAR | Status: AC
  Filled 2018-03-28: qty 5

## 2018-03-28 MED ORDER — SODIUM CHLORIDE 0.9 % IR SOLN
Status: DC | PRN
  Administered 2018-03-28: 1000 mL

## 2018-03-28 MED ORDER — NYSTATIN 100000 UNIT/ML MT SUSP
5.0000 mL | Freq: Four times a day (QID) | OROMUCOSAL | Status: DC
  Administered 2018-03-28 – 2018-03-29 (×3): 500000 [IU] via ORAL
  Filled 2018-03-28 (×3): qty 5

## 2018-03-28 MED ORDER — ONDANSETRON HCL 4 MG/2ML IJ SOLN: 4.0000 mg | Freq: Four times a day (QID) | INTRAMUSCULAR | Status: DC | PRN

## 2018-03-28 MED ORDER — HYOSCYAMINE SULFATE 0.125 MG SL SUBL
0.1250 mg | SUBLINGUAL_TABLET | Freq: Four times a day (QID) | SUBLINGUAL | Status: DC | PRN
  Filled 2018-03-28: qty 1

## 2018-03-28 MED ORDER — ONDANSETRON HCL 4 MG/2ML IJ SOLN
INTRAMUSCULAR | Status: AC
  Filled 2018-03-28: qty 2

## 2018-03-28 MED ORDER — SUCCINYLCHOLINE CHLORIDE 200 MG/10ML IV SOSY
PREFILLED_SYRINGE | INTRAVENOUS | Status: AC
  Filled 2018-03-28: qty 10

## 2018-03-28 MED ORDER — PHENYLEPHRINE 40 MCG/ML (10ML) SYRINGE FOR IV PUSH (FOR BLOOD PRESSURE SUPPORT)
PREFILLED_SYRINGE | INTRAVENOUS | Status: DC | PRN
  Administered 2018-03-28: 120 ug via INTRAVENOUS

## 2018-03-28 MED ORDER — POTASSIUM CHLORIDE CRYS ER 20 MEQ PO TBCR
20.0000 meq | EXTENDED_RELEASE_TABLET | Freq: Two times a day (BID) | ORAL | Status: DC
  Administered 2018-03-28 – 2018-03-29 (×2): 20 meq via ORAL
  Filled 2018-03-28 (×2): qty 1

## 2018-03-28 MED ORDER — DEXAMETHASONE SODIUM PHOSPHATE 10 MG/ML IJ SOLN
INTRAMUSCULAR | Status: AC
  Filled 2018-03-28: qty 1

## 2018-03-28 MED ORDER — MIDAZOLAM HCL 2 MG/2ML IJ SOLN
INTRAMUSCULAR | Status: AC
  Filled 2018-03-28: qty 2

## 2018-03-28 MED ORDER — FENTANYL CITRATE (PF) 250 MCG/5ML IJ SOLN
INTRAMUSCULAR | Status: AC
  Filled 2018-03-28: qty 5

## 2018-03-28 MED ORDER — FENTANYL CITRATE (PF) 100 MCG/2ML IJ SOLN
INTRAMUSCULAR | Status: DC | PRN
  Administered 2018-03-28 (×3): 50 ug via INTRAVENOUS
  Administered 2018-03-28: 100 ug via INTRAVENOUS

## 2018-03-28 MED ORDER — METOPROLOL TARTRATE 25 MG PO TABS
25.0000 mg | ORAL_TABLET | Freq: Two times a day (BID) | ORAL | Status: DC
  Filled 2018-03-28 (×3): qty 1

## 2018-03-28 MED ORDER — HYDRALAZINE HCL 20 MG/ML IJ SOLN: 10.0000 mg | INTRAMUSCULAR | Status: DC | PRN

## 2018-03-28 MED ORDER — MIDAZOLAM HCL 5 MG/5ML IJ SOLN
INTRAMUSCULAR | Status: DC | PRN
  Administered 2018-03-28: 2 mg via INTRAVENOUS

## 2018-03-28 MED ORDER — DEXAMETHASONE SODIUM PHOSPHATE 10 MG/ML IJ SOLN
INTRAMUSCULAR | Status: DC | PRN
  Administered 2018-03-28: 4 mg via INTRAVENOUS

## 2018-03-28 MED ORDER — IOPAMIDOL (ISOVUE-300) INJECTION 61%
INTRAVENOUS | Status: AC
  Filled 2018-03-28: qty 50

## 2018-03-28 MED ORDER — ACETAMINOPHEN 500 MG PO TABS
1000.0000 mg | ORAL_TABLET | Freq: Four times a day (QID) | ORAL | Status: DC
  Administered 2018-03-28 – 2018-03-29 (×4): 1000 mg via ORAL
  Filled 2018-03-28 (×4): qty 2

## 2018-03-28 MED ORDER — HEPARIN SODIUM (PORCINE) 5000 UNIT/ML IJ SOLN
5000.0000 [IU] | Freq: Three times a day (TID) | INTRAMUSCULAR | Status: DC
  Administered 2018-03-28 – 2018-03-29 (×2): 5000 [IU] via SUBCUTANEOUS
  Filled 2018-03-28 (×2): qty 1

## 2018-03-28 MED ORDER — CLONIDINE HCL 0.1 MG PO TABS
0.1000 mg | ORAL_TABLET | Freq: Every day | ORAL | Status: DC
  Administered 2018-03-28: 0.1 mg via ORAL
  Filled 2018-03-28: qty 1

## 2018-03-28 MED ORDER — HYDROCODONE-ACETAMINOPHEN 5-325 MG PO TABS: 1.0000 | ORAL_TABLET | ORAL | Status: DC | PRN

## 2018-03-28 MED ORDER — SODIUM CHLORIDE 0.9 % IV SOLN
INTRAVENOUS | Status: DC | PRN
  Administered 2018-03-28: 100 mL

## 2018-03-28 MED ORDER — LACTATED RINGERS IV SOLN
INTRAVENOUS | Status: DC | PRN
  Administered 2018-03-28: 07:00:00 via INTRAVENOUS

## 2018-03-28 MED ORDER — HEPARIN SODIUM (PORCINE) 5000 UNIT/ML IJ SOLN
5000.0000 [IU] | Freq: Once | INTRAMUSCULAR | Status: AC
  Administered 2018-03-28: 5000 [IU] via SUBCUTANEOUS
  Filled 2018-03-28: qty 1

## 2018-03-28 MED ORDER — BUPIVACAINE HCL (PF) 0.25 % IJ SOLN
INTRAMUSCULAR | Status: DC | PRN
  Administered 2018-03-28: 20 mL

## 2018-03-28 MED ORDER — LEVETIRACETAM 250 MG PO TABS
250.0000 mg | ORAL_TABLET | ORAL | Status: DC
  Administered 2018-03-28 – 2018-03-29 (×2): 250 mg via ORAL
  Filled 2018-03-28 (×5): qty 1

## 2018-03-28 MED ORDER — PROPOFOL 10 MG/ML IV BOLUS
INTRAVENOUS | Status: AC
  Filled 2018-03-28: qty 20

## 2018-03-28 MED ORDER — ACETAMINOPHEN 10 MG/ML IV SOLN: 1000.0000 mg | Freq: Once | INTRAVENOUS | Status: DC | PRN

## 2018-03-28 MED ORDER — ACETAMINOPHEN 500 MG PO TABS
1000.0000 mg | ORAL_TABLET | ORAL | Status: AC
  Administered 2018-03-28: 1000 mg via ORAL
  Filled 2018-03-28: qty 2

## 2018-03-28 MED ORDER — FENTANYL CITRATE (PF) 100 MCG/2ML IJ SOLN: 25.0000 ug | INTRAMUSCULAR | Status: DC | PRN

## 2018-03-28 MED ORDER — ONDANSETRON 4 MG PO TBDP: 4.0000 mg | ORAL_TABLET | Freq: Four times a day (QID) | ORAL | Status: DC | PRN

## 2018-03-28 MED ORDER — DIPHENHYDRAMINE HCL 25 MG PO TABS
25.0000 mg | ORAL_TABLET | Freq: Every evening | ORAL | Status: DC | PRN
  Filled 2018-03-28: qty 1

## 2018-03-28 MED ORDER — SCOPOLAMINE 1 MG/3DAYS TD PT72
1.0000 | MEDICATED_PATCH | Freq: Once | TRANSDERMAL | Status: DC
  Administered 2018-03-28: 1.5 mg via TRANSDERMAL
  Filled 2018-03-28: qty 1

## 2018-03-28 MED ORDER — CIPROFLOXACIN IN D5W 400 MG/200ML IV SOLN
400.0000 mg | INTRAVENOUS | Status: AC
  Administered 2018-03-28: 400 mg via INTRAVENOUS
  Filled 2018-03-28: qty 200

## 2018-03-28 MED ORDER — PANCRELIPASE (LIP-PROT-AMYL) 12000-38000 UNITS PO CPEP
12000.0000 [IU] | ORAL_CAPSULE | Freq: Every day | ORAL | Status: DC
  Administered 2018-03-28 – 2018-03-29 (×2): 12000 [IU] via ORAL
  Filled 2018-03-28 (×2): qty 1

## 2018-03-28 MED ORDER — OXYCODONE HCL 5 MG PO TABS: 5.0000 mg | ORAL_TABLET | Freq: Once | ORAL | Status: DC | PRN

## 2018-03-28 MED ORDER — SUCCINYLCHOLINE CHLORIDE 20 MG/ML IJ SOLN
INTRAMUSCULAR | Status: DC | PRN
  Administered 2018-03-28: 80 mg via INTRAVENOUS

## 2018-03-28 SURGICAL SUPPLY — 47 items
APPLIER CLIP ROT 10 11.4 M/L (STAPLE) ×2
BENZOIN TINCTURE PRP APPL 2/3 (GAUZE/BANDAGES/DRESSINGS) ×2 IMPLANT
BLADE CLIPPER SURG (BLADE) IMPLANT
CANISTER SUCT 3000ML PPV (MISCELLANEOUS) ×2 IMPLANT
CATH REDDICK CHOLANGI 4FR 50CM (CATHETERS) ×2 IMPLANT
CLIP APPLIE ROT 10 11.4 M/L (STAPLE) ×1 IMPLANT
COVER MAYO STAND STRL (DRAPES) ×2 IMPLANT
COVER SURGICAL LIGHT HANDLE (MISCELLANEOUS) ×2 IMPLANT
COVER WAND RF STERILE (DRAPES) ×2 IMPLANT
DERMABOND ADVANCED (GAUZE/BANDAGES/DRESSINGS) ×1
DERMABOND ADVANCED .7 DNX12 (GAUZE/BANDAGES/DRESSINGS) ×1 IMPLANT
DRAPE C-ARM 42X72 X-RAY (DRAPES) IMPLANT
ELECT REM PT RETURN 9FT ADLT (ELECTROSURGICAL) ×2
ELECTRODE REM PT RTRN 9FT ADLT (ELECTROSURGICAL) ×1 IMPLANT
GAUZE SPONGE 2X2 8PLY STRL LF (GAUZE/BANDAGES/DRESSINGS) ×1 IMPLANT
GLOVE BIO SURGEON STRL SZ8 (GLOVE) ×2 IMPLANT
GLOVE SURG ORTHO 8.0 STRL STRW (GLOVE) ×2 IMPLANT
GLOVE SURG SS PI 6.0 STRL IVOR (GLOVE) ×4 IMPLANT
GOWN STRL REUS W/ TWL LRG LVL3 (GOWN DISPOSABLE) ×2 IMPLANT
GOWN STRL REUS W/TWL 2XL LVL3 (GOWN DISPOSABLE) ×4 IMPLANT
GOWN STRL REUS W/TWL LRG LVL3 (GOWN DISPOSABLE) ×2
IV CATH 14GX2 1/4 (CATHETERS) ×2 IMPLANT
KIT BASIN OR (CUSTOM PROCEDURE TRAY) ×2 IMPLANT
KIT TURNOVER KIT B (KITS) ×2 IMPLANT
L-HOOK LAP DISP 36CM (ELECTROSURGICAL) ×2
LHOOK LAP DISP 36CM (ELECTROSURGICAL) ×1 IMPLANT
NS IRRIG 1000ML POUR BTL (IV SOLUTION) ×2 IMPLANT
PAD ARMBOARD 7.5X6 YLW CONV (MISCELLANEOUS) ×2 IMPLANT
PENCIL BUTTON HOLSTER BLD 10FT (ELECTRODE) ×2 IMPLANT
POUCH SPECIMEN RETRIEVAL 10MM (ENDOMECHANICALS) ×2 IMPLANT
SCISSORS LAP 5X35 DISP (ENDOMECHANICALS) ×2 IMPLANT
SET IRRIG TUBING LAPAROSCOPIC (IRRIGATION / IRRIGATOR) ×2 IMPLANT
SET TUBE SMOKE EVAC HIGH FLOW (TUBING) ×2 IMPLANT
SLEEVE ENDOPATH XCEL 5M (ENDOMECHANICALS) ×4 IMPLANT
SPECIMEN JAR SMALL (MISCELLANEOUS) ×2 IMPLANT
SPONGE GAUZE 2X2 STER 10/PKG (GAUZE/BANDAGES/DRESSINGS) ×1
STRIP CLOSURE SKIN 1/2X4 (GAUZE/BANDAGES/DRESSINGS) ×2 IMPLANT
SUT MNCRL AB 4-0 PS2 18 (SUTURE) ×4 IMPLANT
SUT VIC AB 4-0 SH 18 (SUTURE) ×2 IMPLANT
SUT VICRYL 0 UR6 27IN ABS (SUTURE) IMPLANT
TOWEL OR 17X24 6PK STRL BLUE (TOWEL DISPOSABLE) ×2 IMPLANT
TOWEL OR 17X26 10 PK STRL BLUE (TOWEL DISPOSABLE) ×2 IMPLANT
TRAY LAPAROSCOPIC MC (CUSTOM PROCEDURE TRAY) ×2 IMPLANT
TROCAR XCEL BLUNT TIP 100MML (ENDOMECHANICALS) IMPLANT
TROCAR XCEL NON-BLD 11X100MML (ENDOMECHANICALS) ×2 IMPLANT
TROCAR XCEL NON-BLD 5MMX100MML (ENDOMECHANICALS) ×2 IMPLANT
WATER STERILE IRR 1000ML POUR (IV SOLUTION) ×2 IMPLANT

## 2018-03-28 NOTE — Plan of Care (Signed)

## 2018-03-28 NOTE — Interval H&P Note (Signed)
History and Physical Interval Note:  03/28/2018 7:23 AM  [name]  has presented today for surgery, with the diagnosis of cholelithiasis  The various methods of treatment have been discussed with the patient and family. After consideration of risks, benefits and other options for treatment, the patient has consented to  Procedure(s): LAPAROSCOPIC CHOLECYSTECTOMY WITH INTRAOPERATIVE CHOLANGIOGRAM (N/A) as a surgical intervention .  The patient's history has been reviewed, patient examined, no change in status, stable for surgery.  I have reviewed the patient's chart and labs.  Questions were answered to the patient's satisfaction.     Pedro Earls

## 2018-03-28 NOTE — Progress Notes (Signed)
Spoke with Gerald Stabs from [place] regarding patients pacemaker. Magnet was used during the case. Gerald Stabs verified that no interrogation needed and pacemaker is appropriate at this time.

## 2018-03-28 NOTE — Op Note (Signed)
[name]  Primary Care Physician:  Burnis Medin, MD    03/28/2018  8:50 AM  Procedure: Laparoscopic Cholecystectomy with intraoperative cholangiogram  Surgeon: Catalina Antigua B. Hassell Done, MD, FACS Asst:  Armandina Gemma, MD, FACS  Anes:  General  Drains:  None  Findings: Chronic cholecystitis with IOC demonstrating flashback up the pancreatic duct with intrahepatic filling and free flow into the duodenum.  Chronic adhesions of the cecum to the side wall.  No evidence of metastatic melanoma.    Description of Procedure: The patient was taken to OR 1 at [facility] and given general anesthesia.  The patient was prepped with PCMX and draped sterilely. A time out was performed.  Access to the abdomen was achieved with a 5 mm Optiview through the right upper quadrant without difficulty.  Port placement included three 5 mm trocars and an 11 mm in the upper midline.    The gallbladder was visualized and the fundus was grasped and the gallbladder was elevated. Traction on the infundibulum allowed for successful demonstration of the critical view. Inflammatory changes were chronic and not extensive.  The cystic duct was identified and clipped up on the gallbladder and an incision was made in the cystic duct and the Reddick catheter was inserted after milking the cystic duct of any debris. A dynamic cholangiogram was performed which demonstrated intrahepatic filling, no defects and flow into the duodenum as well as back wash into the pancreatic duct.    The cystic duct was then triple clipped and divided, the cystic artery was double clipped and divided and then the gallbladder was removed from the gallbladder bed. Removal of the gallbladder from the gallbladder bed was performed without spillage.  The gallbladder was then placed in a bag and brought out through one of the trocar sites. The gallbladder bed was inspected and no bleeding or bile leaks were seen.      Incisions were injected with marcaine and closed  with 4-0 Monocryl and Dermabond on the skin.  Sponge and needle count were correct.  The patient's BP remained stable and not labile.    The patient was taken to the recovery room in satisfactory condition.

## 2018-03-28 NOTE — Discharge Instructions (Signed)

## 2018-03-28 NOTE — Anesthesia Postprocedure Evaluation (Signed)
Anesthesia Post Note  Patient: [name]  Procedure(s) Performed: LAPAROSCOPIC CHOLECYSTECTOMY WITH INTRAOPERATIVE CHOLANGIOGRAM (N/A Abdomen)     Patient location during evaluation: PACU Anesthesia Type: General Level of consciousness: awake and alert Pain management: pain level controlled Vital Signs Assessment: post-procedure vital signs reviewed and stable Respiratory status: spontaneous breathing, nonlabored ventilation and respiratory function stable Cardiovascular status: blood pressure returned to baseline and stable Postop Assessment: no apparent nausea or vomiting Anesthetic complications: no    Last Vitals:  Vitals:   03/28/18 0948 03/28/18 0954  BP: (!) 156/87 (!) 157/92  Pulse: 62 61  Resp: 12 11  Temp:  36.7 C  SpO2: 99% 96%    Last Pain:  Vitals:   03/28/18 0954  TempSrc:   PainSc: 0-No pain                 Brennan Bailey

## 2018-03-28 NOTE — Anesthesia Procedure Notes (Signed)
Procedure Name: Intubation Date/Time: 03/28/2018 7:38 AM Performed by: Trinna Post., CRNA Pre-anesthesia Checklist: Patient identified, Emergency Drugs available, Suction available, Patient being monitored and Timeout performed Patient Re-evaluated:Patient Re-evaluated prior to induction Oxygen Delivery Method: Circle system utilized Preoxygenation: Pre-oxygenation with 100% oxygen Induction Type: IV induction, Rapid sequence and Cricoid Pressure applied Laryngoscope Size: Mac and 3 Grade View: Grade I Tube type: Oral Tube size: 7.0 mm Number of attempts: 1 Airway Equipment and Method: Stylet Placement Confirmation: ETT inserted through vocal cords under direct vision,  positive ETCO2 and breath sounds checked- equal and bilateral Secured at: 20 cm Tube secured with: Tape Dental Injury: Teeth and Oropharynx as per pre-operative assessment

## 2018-03-28 NOTE — Progress Notes (Signed)
Pt new admit from PACU s/p lap chole with 4 lap sites, dry and intact, alert and oriented with husband at the bedside, no complain of pain, changed her clear liquid diet to vegetarian diet, also due to void.

## 2018-03-28 NOTE — Transfer of Care (Signed)
Immediate Anesthesia Transfer of Care Note  Patient: [name]  Procedure(s) Performed: LAPAROSCOPIC CHOLECYSTECTOMY WITH INTRAOPERATIVE CHOLANGIOGRAM (N/A Abdomen)  Patient Location: PACU  Anesthesia Type:General  Level of Consciousness: awake, alert  and oriented  Airway & Oxygen Therapy: Patient Spontanous Breathing and Patient connected to nasal cannula oxygen  Post-op Assessment: Report given to RN and Post -op Vital signs reviewed and stable  Post vital signs: Reviewed and stable  Last Vitals:  Vitals Value Taken Time  BP 143/81 03/28/2018  9:03 AM  Temp    Pulse 88 03/28/2018  9:04 AM  Resp 13 03/28/2018  9:04 AM  SpO2 99 % 03/28/2018  9:04 AM  Vitals shown include unvalidated device data.  Last Pain:  Vitals:   03/28/18 0650  TempSrc:   PainSc: 7       Patients Stated Pain Goal: 2 (78/46/96 2952)  Complications: No apparent anesthesia complications

## 2018-03-29 DIAGNOSIS — F458 Other somatoform disorders: Secondary | ICD-10-CM | POA: Diagnosis not present

## 2018-03-29 DIAGNOSIS — K8010 Calculus of gallbladder with chronic cholecystitis without obstruction: Secondary | ICD-10-CM | POA: Diagnosis not present

## 2018-03-29 DIAGNOSIS — C7989 Secondary malignant neoplasm of other specified sites: Secondary | ICD-10-CM | POA: Diagnosis not present

## 2018-03-29 DIAGNOSIS — C439 Malignant melanoma of skin, unspecified: Secondary | ICD-10-CM | POA: Diagnosis not present

## 2018-03-29 DIAGNOSIS — C7931 Secondary malignant neoplasm of brain: Secondary | ICD-10-CM | POA: Diagnosis not present

## 2018-03-29 DIAGNOSIS — J45909 Unspecified asthma, uncomplicated: Secondary | ICD-10-CM | POA: Diagnosis not present

## 2018-03-29 NOTE — Discharge Summary (Signed)
Physician Discharge Summary  Patient ID: [name] MRN: [id number] DOB/AGE: 61-05-1955 61 y.o.  PCP: Burnis Medin, MD  Admit date: 03/28/2018 Discharge date: 03/29/2018  Admission Diagnoses:  Chronic recurrent abdominal pain, gallstones and hx of pancreatitis  Discharge Diagnoses:  sam3  Active Problems:   S/P laparoscopic cholecystectomy   Surgery:  Laparoscopic cholecystectomy with intraop cholangiogram  Discharged Condition: improved  Hospital Course:   She had lap chole on Friday.  Chronic cholecystitis with a cholangiogram showing distal common channel of CBD and pancreatic duct with pancreatic reflux ( would explain gallstone pancreatitis) ; no evidence of melanoma noted.   Consults: none  Significant Diagnostic Studies: none    Discharge Exam: Blood pressure 139/72, pulse 75, temperature 97.6 F (36.4 C), temperature source Oral, resp. rate 18, height 5\' 3"  (1.6 m), weight 65.8 kg, SpO2 96 %. Incisions with Dermabond.  Minimal discomfort.   Disposition: Discharge disposition: 01-Home or Self Care       Discharge Instructions    Call MD for:  persistant nausea and vomiting   Complete by:  As directed    Call MD for:  redness, tenderness, or signs of infection (pain, swelling, redness, odor or green/yellow discharge around incision site)   Complete by:  As directed    Call MD for:  temperature >100.4   Complete by:  As directed    Discharge instructions   Complete by:  As directed    Shower and shampoo ad lib Return to preop diet and beyond.   Increase activity slowly   Complete by:  As directed    No wound care   Complete by:  As directed      Allergies as of 03/29/2018      Reactions   Doxycycline Hives   Erythromycin Hives   Gadavist [gadobutrol] Hives, Shortness Of Breath, Itching   Other Hives   Cannot use  Cream or Suppositories   Penicillins Hives   Did it involve swelling of the face/tongue/throat, SOB, or low BP? No Did it involve  sudden or severe rash/hives, skin peeling, or any reaction on the inside of your mouth or nose? Yes Did you need to seek medical attention at a hospital or doctor's office? Yes When did it last happen?5+ years If all above answers are "NO", may proceed with cephalosporin use.   Sulfa Antibiotics Hives   Preparation H [pramox-pe-glycerin-petrolatum] Hives   Cannot use  Cream or Suppositories   Sulfonamide Derivatives Hives   Phenergan [promethazine Hcl] Other (See Comments)   Causes restless leg syndrome      Medication List    TAKE these medications   acetaminophen 500 MG tablet Commonly known as:  TYLENOL Take 500 mg by mouth at bedtime.   azithromycin 250 MG tablet Commonly known as:  ZITHROMAX Take as directed per package   benzonatate 200 MG capsule Commonly known as:  TESSALON Take one cap by mouth at bedtime as needed for cough.  May repeat in 4 to 6 hours   budesonide 3 MG 24 hr capsule Commonly known as:  ENTOCORT EC Take 6 mg by mouth daily.   calcium carbonate 500 MG chewable tablet Commonly known as:  TUMS - dosed in mg elemental calcium Chew 1 tablet by mouth daily as needed for indigestion or heartburn.   cefUROXime 500 MG tablet Commonly known as:  CEFTIN Take 1 tablet (500 mg total) by mouth 2 (two) times daily with a meal.   clonazePAM 1 MG tablet Commonly  known as:  KLONOPIN Take 1 mg by mouth 2 (two) times daily.   cloNIDine 0.1 MG tablet Commonly known as:  CATAPRES Take 1 tablet (0.1 mg total) by mouth at bedtime.   cyanocobalamin 1000 MCG/ML injection Commonly known as:  (VITAMIN B-12) INJECT 1 ML (1,000 MCG TOTAL) INTO THE SKIN EVERY 30 (THIRTY) DAYS. **NOT COVERED UNDER PART D** What changed:  See the new instructions.   dicyclomine 10 MG capsule Commonly known as:  BENTYL Take 10 mg by mouth 4 (four) times daily -  before meals and at bedtime.   diphenhydrAMINE 25 MG tablet Commonly known as:  BENADRYL Take 25 mg by mouth at  bedtime as needed for allergies or sleep.   esomeprazole 40 MG capsule Commonly known as:  NEXIUM TAKE ONE CAPSULE BY MOUTH TWICE A DAY BEFORE MEALS *PT WANTS 90 DAY SUPPLY*   fluorometholone 0.1 % ophthalmic suspension Commonly known as:  FML Place 1 drop into both eyes 4 (four) times daily as needed (itching).   hydrocortisone 2.5 % cream Apply 1 application topically 2 (two) times daily as needed (irritation).   hydrocortisone 25 MG suppository Commonly known as:  ANUSOL-HC Place 1 suppository (25 mg total) rectally at bedtime as needed for hemorrhoids.   hyoscyamine 0.125 MG SL tablet Commonly known as:  LEVSIN SL Place 0.125 mg under the tongue every 6 (six) hours as needed for cramping.   ibuprofen 200 MG tablet Commonly known as:  ADVIL,MOTRIN Take 200-400 mg by mouth 3 (three) times daily as needed for moderate pain.   levETIRAcetam 250 MG tablet Commonly known as:  KEPPRA Take 250 mg by mouth 3 (three) times daily. 6 am, 12 pm, 6 pm   lipase/protease/amylase 12000 units Cpep capsule Commonly known as:  CREON Take 12,000 Units by mouth daily with breakfast.   metoprolol tartrate 25 MG tablet Commonly known as:  LOPRESSOR TAKE 1 TABLET (25 MG TOTAL) BY MOUTH 2 (TWO) TIMES DAILY.   nystatin 100000 UNIT/ML suspension Commonly known as:  MYCOSTATIN Use as directed 5 mLs in the mouth or throat 3 (three) times daily as needed (thrush).   polyethylene glycol packet Commonly known as:  MIRALAX / GLYCOLAX Take 17 g by mouth at bedtime.   potassium chloride SA 20 MEQ tablet Commonly known as:  K-DUR,KLOR-CON Take 1 tablet (20 mEq total) by mouth 2 (two) times daily.   sodium chloride 0.65 % Soln nasal spray Commonly known as:  OCEAN Place 1 spray into both nostrils as needed for congestion.   SYRINGE 3CC/27GX1-1/4" 27G X 1-1/4" 3 ML Misc Inject 1 mL into the muscle every 30 (thirty) days.   VENTOLIN HFA 108 (90 Base) MCG/ACT inhaler Generic drug:   albuterol INHALE 2 PUFFS INTO THE LUNGS EVERY 4 (FOUR) HOURS AS NEEDED FOR WHEEZING OR SHORTNESS OF BREATH. What changed:  See the new instructions.   Vitamin D (Ergocalciferol) 1.25 MG (50000 UT) Caps capsule Commonly known as:  DRISDOL TAKE 1 CAPSULE (50,000 UNITS TOTAL) BY MOUTH EVERY 7 (SEVEN) DAYS. What changed:    when to take this  additional instructions      Follow-up Information    Johnathan Hausen, MD.   Specialty:  General Surgery Contact information: [place] [address] [phone]        [name], MD Follow up.   Specialty:  Internal Medicine Contact information: [address] [place] [address] [phone]  Signed: Pedro Earls 03/29/2018, 9:12 AM

## 2018-03-29 NOTE — Progress Notes (Signed)
[name] Nace to be D/C'd  per MD order. Discussed with the patient and all questions fully answered.  VSS, Skin clean, dry and intact without evidence of skin break down, no evidence of skin tears noted.  IV catheter discontinued intact. Site without signs and symptoms of complications. Dressing and pressure applied.  An After Visit Summary was printed and given to the patient. Patient received prescription.  D/c education completed with patient/family including follow up instructions, medication list, d/c activities limitations if indicated, with other d/c instructions as indicated by MD - patient able to verbalize understanding, all questions fully answered.   Patient instructed to return to ED, call 911, or call MD for any changes in condition.   Patient to be escorted via [place], and D/C home via private auto.

## 2018-03-31 ENCOUNTER — Encounter (HOSPITAL_COMMUNITY): Payer: Self-pay | Admitting: Surgery

## 2018-04-02 DIAGNOSIS — H16223 Keratoconjunctivitis sicca, not specified as Sjogren's, bilateral: Secondary | ICD-10-CM | POA: Diagnosis not present

## 2018-04-02 DIAGNOSIS — H1013 Acute atopic conjunctivitis, bilateral: Secondary | ICD-10-CM | POA: Diagnosis not present

## 2018-04-02 DIAGNOSIS — E119 Type 2 diabetes mellitus without complications: Secondary | ICD-10-CM | POA: Diagnosis not present

## 2018-06-14 ENCOUNTER — Other Ambulatory Visit: Payer: Self-pay | Admitting: Family Medicine

## 2018-06-30 DIAGNOSIS — Z85828 Personal history of other malignant neoplasm of skin: Secondary | ICD-10-CM | POA: Diagnosis not present

## 2018-06-30 DIAGNOSIS — L821 Other seborrheic keratosis: Secondary | ICD-10-CM | POA: Diagnosis not present

## 2018-06-30 DIAGNOSIS — Z85820 Personal history of malignant melanoma of skin: Secondary | ICD-10-CM | POA: Diagnosis not present

## 2018-07-11 ENCOUNTER — Other Ambulatory Visit: Payer: Self-pay

## 2018-07-11 ENCOUNTER — Telehealth: Payer: Self-pay

## 2018-07-11 MED ORDER — CYANOCOBALAMIN 1000 MCG/ML IJ SOLN: 1000.0000 ug | INTRAMUSCULAR | 0 refills | Status: DC

## 2018-07-11 NOTE — Telephone Encounter (Signed)
B12 single dose vials and syringes sent to [facility] per Pt request and Dr. Olin Pia orders.

## 2018-07-15 ENCOUNTER — Encounter: Payer: Self-pay | Admitting: Internal Medicine

## 2018-07-15 ENCOUNTER — Ambulatory Visit: Payer: Self-pay

## 2018-07-15 ENCOUNTER — Ambulatory Visit (INDEPENDENT_AMBULATORY_CARE_PROVIDER_SITE_OTHER): Payer: Medicare Other | Admitting: Internal Medicine

## 2018-07-15 ENCOUNTER — Telehealth: Payer: Self-pay | Admitting: Internal Medicine

## 2018-07-15 ENCOUNTER — Other Ambulatory Visit: Payer: Self-pay

## 2018-07-15 DIAGNOSIS — C799 Secondary malignant neoplasm of unspecified site: Secondary | ICD-10-CM

## 2018-07-15 DIAGNOSIS — R739 Hyperglycemia, unspecified: Secondary | ICD-10-CM

## 2018-07-15 DIAGNOSIS — J45909 Unspecified asthma, uncomplicated: Secondary | ICD-10-CM

## 2018-07-15 DIAGNOSIS — C439 Malignant melanoma of skin, unspecified: Secondary | ICD-10-CM

## 2018-07-15 DIAGNOSIS — Z0184 Encounter for antibody response examination: Secondary | ICD-10-CM | POA: Diagnosis not present

## 2018-07-15 LAB — HEMOGLOBIN A1C: Hgb A1c MFr Bld: 5.9 % (ref 4.6–6.5)

## 2018-07-15 MED ORDER — BUDESONIDE-FORMOTEROL FUMARATE 160-4.5 MCG/ACT IN AERO: 2.0000 | INHALATION_SPRAY | Freq: Two times a day (BID) | RESPIRATORY_TRACT | 3 refills | Status: DC

## 2018-07-15 NOTE — Telephone Encounter (Signed)
Pt called requesting antibody testing for COVID-19.  She states that she was very sick this past winter and feels that she may nave already had the virus. She has a significant hx of cancer. Pt was informed that we do not yet do antibody testing. She states she was never tested for COVID-19. She request a consult with Dr Regis Bill.  Call transferred to office for consult.  Reason for Disposition . [1] Caller requesting NON-URGENT health information AND [2] PCP's office is the best resource  Answer Assessment - Initial Assessment Questions 1. REASON FOR CALL or QUESTION: "What is your reason for calling today?" or "How can I best help you?" or "What question do you have that I can help answer?"     She is requesting antibody test. Pt informed we do not test for antibodies at this time.  Protocols used: INFORMATION ONLY CALL-A-AH

## 2018-07-15 NOTE — Telephone Encounter (Signed)
Noted. Will send to Dr. Regis Bill as Juluis Rainier.

## 2018-07-15 NOTE — Telephone Encounter (Signed)
Informed pt we do provide antibody testing if provider feels it beneficial. Virtual appointment made to discuss

## 2018-07-15 NOTE — Progress Notes (Signed)
Virtual Visit via Video Note  I connected with@ on 07/15/18 at  1:00 PM EDT by a video enabled telemedicine application and verified that I am speaking with the correct person using two identifiers. Location patient: home Location provider:work  office Persons participating in the virtual visit: patient, provider  [place] national recommendations  regarding COVID 19 pandemic   video visit is advised over in office visit for this patient.  Patient aware  of the limitations of evaluation and management by telemedicine and  availability of in person appointments. and agreed to proceed.   HPI: [name] presents for video visit   Wants to know if has poss hx of covid  Had prolonged bad  pna bronchitis in the fall of 2019 and had 2 courses of antibiotic     And had to use nebulizer and ventolin  Then had friends that had traveled to [place] and [place]  In [place] and visited her   They at some point had serious resp sx but no dx   . She has no current fever sx    just her allergy wheezy cough  Uses albuterol as needed .    no recent allergy eval allergy sx went away when she underwent   Immunotherapy for her cancer .   hasn't had a1c  checked recently  But other has been done chem and cbc    To have update scans soon at [facility] ROS: See pertinent positives and negatives per HPI.  Past Medical History:  Diagnosis Date  . Arthritis   . Asthma   . Atrial fibrillation ([place])   . Atrial tachycardia ([place])   . Cervicalgia   . Complication of anesthesia    pt states wakes up with "shakes"  . CVA (cerebral infarction)    2012 with dizziness and vision change felt embolic from atrial tachy  . Disorder of bone and cartilage, unspecified   . Disturbance of skin sensation   . Diverticulosis   . DM (diabetes mellitus) ([place])   . DVT (deep venous thrombosis) ([place])   . DVT (deep venous thrombosis) ([place])   . Family history of anesthesia complication    PONV  . Fatty liver   . GERD  (gastroesophageal reflux disease)   . History of hiatal hernia   . History of pacemaker   . History of radiation therapy 10/24/12   brain  . HLD (hyperlipidemia)   . HTN (hypertension)   . Hypoglycemia, unspecified   . 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 Liver metastasis ([place])   . Lung metastasis ([place])   . Metastasis to brain ([place])   . Osteopenia   . Other nonspecific abnormal serum enzyme levels   . Other specified congenital anomalies of nervous system   . Pacemaker    autonomic dysfunction  . Pancreatitis    from therapy  . Peripheral vascular disease ([place])   . Pneumonia   . PONV (postoperative nausea and vomiting)   . POTS (postural orthostatic tachycardia syndrome)   . Skin cancer    Hx: of lung lesion  . Stroke [facility])    left weaker   . Swelling of limb     Past Surgical History:  Procedure Laterality Date  . ABLATION SAPHENOUS VEIN W/ RFA     2002  . CARPAL TUNNEL RELEASE Left   . CHOLECYSTECTOMY  03/28/2018  . CHOLECYSTECTOMY N/A 03/28/2018   Procedure: LAPAROSCOPIC CHOLECYSTECTOMY WITH INTRAOPERATIVE CHOLANGIOGRAM;  Surgeon: Johnathan Hausen,  MD;  Location: [facility];  Service: General;  Laterality: N/A;  . CRANIOTOMY N/A 09/30/2012   suboccipital craniectomy  . CRANIOTOMY Left 02/09/2013   Procedure: LEFT PARIETAL CRANIOTOMY with stealth;  Surgeon: [name], MD;  Location: [facility];  Service: Neurosurgery;  Laterality: Left;  LEFT Parietal Craniotomy for tumor with stealth  . DEEP AXILLARY SENTINEL NODE BIOPSY / EXCISION     due to extensive Melanoma-right arm  . fatty tumor removed     from chest  . INSERT / REPLACE / REMOVE PACEMAKER  2012   1999, x 3  . LAPAROSCOPY  09/06/2011   Procedure: LAPAROSCOPY OPERATIVE;  Surgeon: Claiborne Billings A. Pamala Hurry, MD;  Location: [facility];  Service: Gynecology;  Laterality: Left;  with Left Ovarian Cystectomy   . MELANOMA EXCISION     with removal of lymph nodes, left shoulder  . NOSE SURGERY  2010,  2012   for nose bleeds x 2  . PACEMAKER IMPLANT    . TONSILLECTOMY AND ADENOIDECTOMY      Family History  Problem Relation Age of Onset  . Stroke Mother   . Colon polyps Mother   . Colon cancer Mother   . Heart disease Father   . Diabetes Father   . Kidney disease Father   . Diabetes Maternal Grandmother   . Clotting disorder Maternal Grandmother        stroke  . Crohn's disease Maternal Grandmother   . Diabetes Paternal Grandmother   . Aneurysm Sister        brain    Social History   Tobacco Use  . Smoking status: Never Smoker  . Smokeless tobacco: Never Used  Substance Use Topics  . Alcohol use: Not Currently    Comment: glass of wine daily  . Drug use: No      Current Outpatient Medications:  .  acetaminophen (TYLENOL) 500 MG tablet, Take 500 mg by mouth at bedtime. , Disp: , Rfl:  .  albuterol (VENTOLIN HFA) 108 (90 Base) MCG/ACT inhaler, INHALE 2 PUFFS INTO THE LUNGS EVERY 4 (FOUR) HOURS AS NEEDED FOR WHEEZING OR SHORTNESS OF BREATH., Disp: 18 Inhaler, Rfl: 0 .  calcium carbonate (TUMS - DOSED IN MG ELEMENTAL CALCIUM) 500 MG chewable tablet, Chew 1 tablet by mouth daily as needed for indigestion or heartburn., Disp: , Rfl:  .  cefUROXime (CEFTIN) 500 MG tablet, Take 1 tablet (500 mg total) by mouth 2 (two) times daily with a meal., Disp: 20 tablet, Rfl: 0 .  clonazePAM (KLONOPIN) 1 MG tablet, Take 1 mg by mouth 2 (two) times daily. , Disp: , Rfl: 3 .  cloNIDine (CATAPRES) 0.1 MG tablet, Take 1 tablet (0.1 mg total) by mouth at bedtime., Disp: 90 tablet, Rfl: 3 .  cyanocobalamin (,VITAMIN B-12,) 1000 MCG/ML injection, Inject 1 mL (1,000 mcg total) into the muscle every 30 (thirty) days., Disp: 12 mL, Rfl: 0 .  dicyclomine (BENTYL) 10 MG capsule, Take 10 mg by mouth 4 (four) times daily -  before meals and at bedtime. , Disp: , Rfl: 11 .  diphenhydrAMINE (BENADRYL) 25 MG tablet, Take 25 mg by mouth at bedtime as needed for allergies or sleep. , Disp: , Rfl:  .   esomeprazole (NEXIUM) 40 MG capsule, TAKE ONE CAPSULE BY MOUTH TWICE A DAY BEFORE MEALS *PT WANTS 90 DAY SUPPLY*, Disp: 180 capsule, Rfl: 0 .  fluorometholone (FML) 0.1 % ophthalmic suspension, Place 1 drop into both eyes 4 (four) times daily as needed (itching). ,  Disp: , Rfl: 1 .  hydrocortisone (ANUSOL-HC) 25 MG suppository, Place 1 suppository (25 mg total) rectally at bedtime as needed for hemorrhoids., Disp: 12 suppository, Rfl: 0 .  hydrocortisone 2.5 % cream, Apply 1 application topically 2 (two) times daily as needed (irritation)., Disp: 30 g, Rfl: 0 .  hyoscyamine (LEVSIN SL) 0.125 MG SL tablet, Place 0.125 mg under the tongue every 6 (six) hours as needed for cramping., Disp: , Rfl:  .  ibuprofen (ADVIL,MOTRIN) 200 MG tablet, Take 200-400 mg by mouth 3 (three) times daily as needed for moderate pain., Disp: , Rfl:  .  levETIRAcetam (KEPPRA) 250 MG tablet, Take 250 mg by mouth 3 (three) times daily. 6 am, 12 pm, 6 pm, Disp: , Rfl:  .  lipase/protease/amylase (CREON) 12000 units CPEP capsule, Take 12,000 Units by mouth daily with breakfast., Disp: , Rfl:  .  metoprolol tartrate (LOPRESSOR) 25 MG tablet, TAKE 1 TABLET (25 MG TOTAL) BY MOUTH 2 (TWO) TIMES DAILY., Disp: 180 tablet, Rfl: 3 .  nystatin (MYCOSTATIN) 100000 UNIT/ML suspension, Use as directed 5 mLs in the mouth or throat 3 (three) times daily as needed (thrush)., Disp: , Rfl:  .  polyethylene glycol (MIRALAX / GLYCOLAX) packet, Take 17 g by mouth at bedtime., Disp: , Rfl:  .  potassium chloride SA (K-DUR,KLOR-CON) 20 MEQ tablet, Take 1 tablet (20 mEq total) by mouth 2 (two) times daily., Disp: 60 tablet, Rfl: 3 .  sodium chloride (OCEAN) 0.65 % SOLN nasal spray, Place 1 spray into both nostrils as needed for congestion., Disp: , Rfl:  .  Syringe/Needle, Disp, (SYRINGE 3CC/27GX1-1/4") 27G X 1-1/4" 3 ML MISC, Inject 1 mL into the muscle every 30 (thirty) days., Disp: 12 each, Rfl: 0 .  Vitamin D, Ergocalciferol, (DRISDOL) 50000 units  CAPS capsule, TAKE 1 CAPSULE (50,000 UNITS TOTAL) BY MOUTH EVERY 7 (SEVEN) DAYS. (Patient taking differently: Take 50,000 Units by mouth See admin instructions. Take 50000 units by mouth every Mon, Wed, and Sat), Disp: 4 capsule, Rfl: 11 .  budesonide-formoterol (SYMBICORT) 160-4.5 MCG/ACT inhaler, Inhale 2 puffs into the lungs 2 (two) times a day. For wheezing suppresssion, Disp: 1 Inhaler, Rfl: 3  EXAM: BP Readings from Last 3 Encounters:  03/29/18 119/69  03/25/18 122/80  12/25/17 137/87    VITALS per patient if applicable:  GENERAL: alert, oriented, appears well and in no acute distress  HEENT: atraumatic, conjunttiva clear, no obvious abnormalities on inspection of external nose and ears  NECK: normal movements of the head and neck  LUNGS: on inspection no signs of respiratory distress, breathing rate appears normal, no obvious gross SOB, gasping or wheezing  CV: no obvious cyanosis  PSYCH/NEURO: pleasant and cooperative, no obvious depression or anxiety, speech and thought processing grossly intact Lab Results  Component Value Date   WBC 6.0 03/28/2018   HGB 13.5 03/28/2018   HCT 37.9 03/28/2018   PLT 225 03/28/2018   GLUCOSE 89 05/27/2015   CHOL 145 02/09/2014   TRIG 123.0 02/09/2014   HDL 47.90 02/09/2014   LDLDIRECT 109.7 10/11/2010   LDLCALC 73 02/09/2014   ALT 33 05/27/2015   AST 25 05/27/2015   NA 140 05/27/2015   K 4.1 05/27/2015   CL 102 11/12/2013   CREATININE 0.92 03/28/2018   BUN 18.8 05/27/2015   CO2 27 05/27/2015   TSH 1.146 12/17/2013   INR 1.03 08/28/2013   HGBA1C 5.9 07/15/2018    ASSESSMENT AND PLAN:  Discussed the following assessment and plan:  Immunity  status testing - concern about covid exposure but not currentlly ill limtations discussed - Plan: SAR CoV2 Serology (COVID 19)AB(IGG)IA  Hyperglycemia - update  a1c  - Plan: Hemoglobin A1c  Reactive airway disease without complication, unspecified asthma severity, unspecified whether  persistent ? - supsected trigger   extrinsic by hx  but  not certain dx  see text responds to  albuterol trial symbicort control in season ? if has had  pfts   Malignant melanoma, metastatic ([place])  Counseled.   Limitation of in formation from the serology test even if pos  Not declare immunity protection  She is a champ on  Preventing  Infection to her risk since her  Immune rx   No further advice for that   Expectant management and discussion of plan and treatment with opportunity to ask questions and all were answered. The patient agreed with the plan and demonstrated an understanding of the instructions.   Advised to call back or seek an in-person evaluation if worsening  or having  further concerns . Total visit 41mins > 50% spent counseling and coordinating care as indicated in above note and in instructions to patient .      [name], MD

## 2018-07-15 NOTE — Telephone Encounter (Signed)
Patient is inquiring about getting antibody testing done.  She was sick with Bacterial Pneumonia back in September.  She was sick enough that she is thinking she may have had Covid.  She has to go to [facility] to get updated cancer screening done so is wanting the tests done and returned by then.  Patient is requesting a call back ASAP.

## 2018-07-16 LAB — SAR COV2 SEROLOGY (COVID19)AB(IGG),IA: SARS CoV2 AB IGG: NEGATIVE

## 2018-07-22 DIAGNOSIS — J302 Other seasonal allergic rhinitis: Secondary | ICD-10-CM | POA: Diagnosis not present

## 2018-07-22 DIAGNOSIS — C439 Malignant melanoma of skin, unspecified: Secondary | ICD-10-CM | POA: Diagnosis not present

## 2018-07-22 DIAGNOSIS — C7931 Secondary malignant neoplasm of brain: Secondary | ICD-10-CM | POA: Diagnosis not present

## 2018-07-22 DIAGNOSIS — C787 Secondary malignant neoplasm of liver and intrahepatic bile duct: Secondary | ICD-10-CM | POA: Diagnosis not present

## 2018-07-22 DIAGNOSIS — C7800 Secondary malignant neoplasm of unspecified lung: Secondary | ICD-10-CM | POA: Diagnosis not present

## 2018-07-22 DIAGNOSIS — E559 Vitamin D deficiency, unspecified: Secondary | ICD-10-CM | POA: Diagnosis not present

## 2018-07-22 DIAGNOSIS — K580 Irritable bowel syndrome with diarrhea: Secondary | ICD-10-CM | POA: Diagnosis not present

## 2018-07-22 DIAGNOSIS — L989 Disorder of the skin and subcutaneous tissue, unspecified: Secondary | ICD-10-CM | POA: Insufficient documentation

## 2018-07-22 DIAGNOSIS — Z6826 Body mass index (BMI) 26.0-26.9, adult: Secondary | ICD-10-CM | POA: Diagnosis not present

## 2018-07-24 DIAGNOSIS — Z85828 Personal history of other malignant neoplasm of skin: Secondary | ICD-10-CM | POA: Diagnosis not present

## 2018-07-24 DIAGNOSIS — L910 Hypertrophic scar: Secondary | ICD-10-CM | POA: Diagnosis not present

## 2018-07-24 DIAGNOSIS — D485 Neoplasm of uncertain behavior of skin: Secondary | ICD-10-CM | POA: Diagnosis not present

## 2018-07-24 DIAGNOSIS — L570 Actinic keratosis: Secondary | ICD-10-CM | POA: Diagnosis not present

## 2018-07-24 DIAGNOSIS — Z85820 Personal history of malignant melanoma of skin: Secondary | ICD-10-CM | POA: Diagnosis not present

## 2018-08-05 DIAGNOSIS — C439 Malignant melanoma of skin, unspecified: Secondary | ICD-10-CM | POA: Diagnosis not present

## 2018-08-05 DIAGNOSIS — K52831 Collagenous colitis: Secondary | ICD-10-CM | POA: Diagnosis not present

## 2018-08-05 DIAGNOSIS — C787 Secondary malignant neoplasm of liver and intrahepatic bile duct: Secondary | ICD-10-CM | POA: Diagnosis not present

## 2018-09-11 DIAGNOSIS — Z85820 Personal history of malignant melanoma of skin: Secondary | ICD-10-CM | POA: Diagnosis not present

## 2018-09-11 DIAGNOSIS — W57XXXA Bitten or stung by nonvenomous insect and other nonvenomous arthropods, initial encounter: Secondary | ICD-10-CM | POA: Diagnosis not present

## 2018-09-11 DIAGNOSIS — Z85828 Personal history of other malignant neoplasm of skin: Secondary | ICD-10-CM | POA: Diagnosis not present

## 2018-12-01 ENCOUNTER — Other Ambulatory Visit: Payer: Self-pay

## 2018-12-01 ENCOUNTER — Telehealth: Payer: Self-pay | Admitting: Internal Medicine

## 2018-12-01 MED ORDER — FLUTICASONE PROPIONATE 50 MCG/ACT NA SUSP: 2.0000 | Freq: Every day | NASAL | 12 refills | Status: DC

## 2018-12-01 NOTE — Telephone Encounter (Signed)
Pt has been told that refills have been sent in

## 2018-12-01 NOTE — Telephone Encounter (Signed)
Ok to send in refill flonase for a year

## 2018-12-01 NOTE — Telephone Encounter (Signed)
Medication Refill - Medication: Pt is requesting to have flonase sent in for her allergies. Please advise.  Has the patient contacted their pharmacy? Yes.   (Agent: If no, request that the patient contact the pharmacy for the refill.) (Agent: If yes, when and what did the pharmacy advise?)  Preferred Pharmacy (with phone number or street name):  [facility], [place] - [address]  [place] [address]  Phone: [phone] Fax: [phone]  Not a 24 hour pharmacy; exact hours not known.     Agent: Please be advised that RX refills may take up to 3 business days. We ask that you follow-up with your pharmacy.

## 2018-12-01 NOTE — Telephone Encounter (Signed)
Please advise if okay?

## 2018-12-03 ENCOUNTER — Other Ambulatory Visit: Payer: Self-pay

## 2018-12-03 ENCOUNTER — Encounter: Payer: Self-pay | Admitting: Internal Medicine

## 2018-12-03 ENCOUNTER — Telehealth (INDEPENDENT_AMBULATORY_CARE_PROVIDER_SITE_OTHER): Payer: Medicare Other | Admitting: Internal Medicine

## 2018-12-03 DIAGNOSIS — R0600 Dyspnea, unspecified: Secondary | ICD-10-CM | POA: Diagnosis not present

## 2018-12-03 DIAGNOSIS — J45909 Unspecified asthma, uncomplicated: Secondary | ICD-10-CM | POA: Diagnosis not present

## 2018-12-03 DIAGNOSIS — L509 Urticaria, unspecified: Secondary | ICD-10-CM | POA: Diagnosis not present

## 2018-12-03 DIAGNOSIS — Z889 Allergy status to unspecified drugs, medicaments and biological substances status: Secondary | ICD-10-CM | POA: Diagnosis not present

## 2018-12-03 MED ORDER — BREO ELLIPTA 100-25 MCG/INH IN AEPB: 1.0000 | INHALATION_SPRAY | Freq: Every day | RESPIRATORY_TRACT | 3 refills | Status: DC

## 2018-12-03 NOTE — Progress Notes (Signed)
Virtual Visit via Video Note  I connected with@ on 12/03/18 at 11:30 AM EDT by a video enabled telemedicine application and verified that I am speaking with the correct person using two identifiers. Location patient: home Location provideror home office Persons participating in the virtual visit: patient, provider  [place] national recommendations  regarding COVID 19 pandemic   video visit is advised over in office visit for this patient.  Patient aware  of the limitations of evaluation and management by telemedicine and  availability of in person appointments. and agreed to proceed.   HPI: [name] presents for video visit but could not connect basic Internet difficult T.  So ended up being a telephone visit.  She has a long history of allergies recurrent hives seasonal allergies and what is felt to be possible asthma.  She has had some wheezing over the last weeks that occurs mostly in the evenings which responds somewhat to albuterol although she is limiting its use to 3-4 times a week.  There is no serious cough fever has used some Flonase with some help but continued symptoms.  She has remitted versus cured metastatic melanoma that has not recurred a history of pots She has been very careful since her cancer diagnosis and immunologic therapy using in 95's when needed and otherwise a regular mask.  There is no chest pain or chills. She states it feels like her asthmatic symptoms in the past but asks about COVID also.  She notices some symptoms get worse when she goes outside and is known to be allergic to ragweed.      ROS: See pertinent positives and negatives per HPI.  Past Medical History:  Diagnosis Date  . Arthritis   . Asthma   . Atrial fibrillation ([place])   . Atrial tachycardia ([place])   . Cervicalgia   . Complication of anesthesia    pt states wakes up with "shakes"  . CVA (cerebral infarction)    2012 with dizziness and vision change felt embolic from atrial  tachy  . Disorder of bone and cartilage, unspecified   . Disturbance of skin sensation   . Diverticulosis   . DM (diabetes mellitus) ([place])   . DVT (deep venous thrombosis) ([place])   . DVT (deep venous thrombosis) ([place])   . Family history of anesthesia complication    PONV  . Fatty liver   . GERD (gastroesophageal reflux disease)   . History of hiatal hernia   . History of pacemaker   . History of radiation therapy 10/24/12   brain  . HLD (hyperlipidemia)   . HTN (hypertension)   . Hypoglycemia, unspecified   . 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 Liver metastasis ([place])   . Lung metastasis ([place])   . Metastasis to brain ([place])   . Osteopenia   . Other nonspecific abnormal serum enzyme levels   . Other specified congenital anomalies of nervous system   . Pacemaker    autonomic dysfunction  . Pancreatitis    from therapy  . Peripheral vascular disease ([place])   . Pneumonia   . PONV (postoperative nausea and vomiting)   . POTS (postural orthostatic tachycardia syndrome)   . Skin cancer    Hx: of lung lesion  . Stroke [facility])    left weaker   . Swelling of limb     Past Surgical History:  Procedure Laterality Date  . ABLATION SAPHENOUS VEIN W/ RFA     2002  .  CARPAL TUNNEL RELEASE Left   . CHOLECYSTECTOMY  03/28/2018  . CHOLECYSTECTOMY N/A 03/28/2018   Procedure: LAPAROSCOPIC CHOLECYSTECTOMY WITH INTRAOPERATIVE CHOLANGIOGRAM;  Surgeon: Johnathan Hausen, MD;  Location: [facility];  Service: General;  Laterality: N/A;  . CRANIOTOMY N/A 09/30/2012   suboccipital craniectomy  . CRANIOTOMY Left 02/09/2013   Procedure: LEFT PARIETAL CRANIOTOMY with stealth;  Surgeon: [name], MD;  Location: [facility];  Service: Neurosurgery;  Laterality: Left;  LEFT Parietal Craniotomy for tumor with stealth  . DEEP AXILLARY SENTINEL NODE BIOPSY / EXCISION     due to extensive Melanoma-right arm  . fatty tumor removed     from chest  . INSERT / REPLACE / REMOVE  PACEMAKER  2012   1999, x 3  . LAPAROSCOPY  09/06/2011   Procedure: LAPAROSCOPY OPERATIVE;  Surgeon: Claiborne Billings A. Pamala Hurry, MD;  Location: [facility];  Service: Gynecology;  Laterality: Left;  with Left Ovarian Cystectomy   . MELANOMA EXCISION     with removal of lymph nodes, left shoulder  . NOSE SURGERY  2010, 2012   for nose bleeds x 2  . PACEMAKER IMPLANT    . TONSILLECTOMY AND ADENOIDECTOMY      Family History  Problem Relation Age of Onset  . Stroke Mother   . Colon polyps Mother   . Colon cancer Mother   . Heart disease Father   . Diabetes Father   . Kidney disease Father   . Diabetes Maternal Grandmother   . Clotting disorder Maternal Grandmother        stroke  . Crohn's disease Maternal Grandmother   . Diabetes Paternal Grandmother   . Aneurysm Sister        brain    Social History   Tobacco Use  . Smoking status: Never Smoker  . Smokeless tobacco: Never Used  Substance Use Topics  . Alcohol use: Not Currently    Comment: glass of wine daily  . Drug use: No      Current Outpatient Medications:  .  acetaminophen (TYLENOL) 500 MG tablet, Take 500 mg by mouth at bedtime. , Disp: , Rfl:  .  albuterol (VENTOLIN HFA) 108 (90 Base) MCG/ACT inhaler, INHALE 2 PUFFS INTO THE LUNGS EVERY 4 (FOUR) HOURS AS NEEDED FOR WHEEZING OR SHORTNESS OF BREATH., Disp: 18 Inhaler, Rfl: 0 .  calcium carbonate (TUMS - DOSED IN MG ELEMENTAL CALCIUM) 500 MG chewable tablet, Chew 1 tablet by mouth daily as needed for indigestion or heartburn., Disp: , Rfl:  .  cefUROXime (CEFTIN) 500 MG tablet, Take 1 tablet (500 mg total) by mouth 2 (two) times daily with a meal., Disp: 20 tablet, Rfl: 0 .  clonazePAM (KLONOPIN) 1 MG tablet, Take 1 mg by mouth 2 (two) times daily. , Disp: , Rfl: 3 .  cloNIDine (CATAPRES) 0.1 MG tablet, Take 1 tablet (0.1 mg total) by mouth at bedtime., Disp: 90 tablet, Rfl: 3 .  cyanocobalamin (,VITAMIN B-12,) 1000 MCG/ML injection, Inject 1 mL (1,000 mcg total) into the muscle  every 30 (thirty) days., Disp: 12 mL, Rfl: 0 .  dicyclomine (BENTYL) 10 MG capsule, Take 10 mg by mouth 4 (four) times daily -  before meals and at bedtime. , Disp: , Rfl: 11 .  diphenhydrAMINE (BENADRYL) 25 MG tablet, Take 25 mg by mouth at bedtime as needed for allergies or sleep. , Disp: , Rfl:  .  esomeprazole (NEXIUM) 40 MG capsule, TAKE ONE CAPSULE BY MOUTH TWICE A DAY BEFORE MEALS *PT WANTS 90 DAY  SUPPLY*, Disp: 180 capsule, Rfl: 0 .  fluorometholone (FML) 0.1 % ophthalmic suspension, Place 1 drop into both eyes 4 (four) times daily as needed (itching). , Disp: , Rfl: 1 .  fluticasone (FLONASE) 50 MCG/ACT nasal spray, Place 2 sprays into both nostrils daily., Disp: 16 g, Rfl: 12 .  fluticasone furoate-vilanterol (BREO ELLIPTA) 100-25 MCG/INH AEPB, Inhale 1 puff into the lungs daily. For wheezing suppression, Disp: 1 each, Rfl: 3 .  hydrocortisone (ANUSOL-HC) 25 MG suppository, Place 1 suppository (25 mg total) rectally at bedtime as needed for hemorrhoids., Disp: 12 suppository, Rfl: 0 .  hydrocortisone 2.5 % cream, Apply 1 application topically 2 (two) times daily as needed (irritation)., Disp: 30 g, Rfl: 0 .  hyoscyamine (LEVSIN SL) 0.125 MG SL tablet, Place 0.125 mg under the tongue every 6 (six) hours as needed for cramping., Disp: , Rfl:  .  ibuprofen (ADVIL,MOTRIN) 200 MG tablet, Take 200-400 mg by mouth 3 (three) times daily as needed for moderate pain., Disp: , Rfl:  .  levETIRAcetam (KEPPRA) 250 MG tablet, Take 250 mg by mouth 3 (three) times daily. 6 am, 12 pm, 6 pm, Disp: , Rfl:  .  lipase/protease/amylase (CREON) 12000 units CPEP capsule, Take 12,000 Units by mouth daily with breakfast., Disp: , Rfl:  .  metoprolol tartrate (LOPRESSOR) 25 MG tablet, TAKE 1 TABLET (25 MG TOTAL) BY MOUTH 2 (TWO) TIMES DAILY., Disp: 180 tablet, Rfl: 3 .  nystatin (MYCOSTATIN) 100000 UNIT/ML suspension, Use as directed 5 mLs in the mouth or throat 3 (three) times daily as needed (thrush)., Disp: , Rfl:   .  polyethylene glycol (MIRALAX / GLYCOLAX) packet, Take 17 g by mouth at bedtime., Disp: , Rfl:  .  potassium chloride SA (K-DUR,KLOR-CON) 20 MEQ tablet, Take 1 tablet (20 mEq total) by mouth 2 (two) times daily., Disp: 60 tablet, Rfl: 3 .  sodium chloride (OCEAN) 0.65 % SOLN nasal spray, Place 1 spray into both nostrils as needed for congestion., Disp: , Rfl:  .  Syringe/Needle, Disp, (SYRINGE 3CC/27GX1-1/4") 27G X 1-1/4" 3 ML MISC, Inject 1 mL into the muscle every 30 (thirty) days., Disp: 12 each, Rfl: 0 .  Vitamin D, Ergocalciferol, (DRISDOL) 50000 units CAPS capsule, TAKE 1 CAPSULE (50,000 UNITS TOTAL) BY MOUTH EVERY 7 (SEVEN) DAYS. (Patient taking differently: Take 50,000 Units by mouth See admin instructions. Take 50000 units by mouth every Mon, Wed, and Sat), Disp: 4 capsule, Rfl: 11  EXAM: BP Readings from Last 3 Encounters:  03/29/18 119/69  03/25/18 122/80  12/25/17 137/87    VITALS per patient if applicable:  GENERAL: alert, oriented, appears well and in no acute distress she does not sound hoarse stridorous or having any respiratory distress or wheezes at this time.  PSYCH/NEURO: pleasant and cooperative, no obvious depression or anxiety, speech and thought processing grossly intact   ASSESSMENT AND PLAN:  Discussed the following assessment and plan:    ICD-10-CM   1. Dyspnea, unspecified type  R06.00    pattern cw her asthma  and allergy context fall etc   allergy referral  add controller inhaler can get cofid tested but not typical  2. Hives his recurrent    L50.9 Ambulatory referral to Allergy  3. Multiple allergies  Z88.9 Ambulatory referral to Allergy  4. Uncomplicated asthma, unspecified asthma severity, unspecified whether persistent  J45.909 Ambulatory referral to Allergy   She gets some response from albuterol and is not on a controller inhaler sent in [place] has not really had  a problem with the albuterol so should tolerate.  Picked over Symbicort because of  possible formulary issues.  1 puff a day we discussed referral to allergy will send in referral for help control. 's reviewed symptoms of COVID other causes of dyspnea but her symptoms are not around-the-clock nor consistent with other diagnoses at this time. She is to go to tertiary center next week for some GI issues she is having after her gallbladder was out. Can get covid tested today and get results back in 2-3 days  Counseled.   Expectant management and discussion of plan and treatment with opportunity to ask questions and all were answered. The patient agreed with the plan and demonstrated an understanding of the instructions.   Advised to call back or seek an in-person evaluation if worsening  or having  further concerns .  I provided 18  minutes of non-face-to-face time during this encounter.   [name], MD

## 2018-12-08 ENCOUNTER — Encounter: Payer: Self-pay | Admitting: Allergy and Immunology

## 2018-12-08 ENCOUNTER — Other Ambulatory Visit: Payer: Self-pay

## 2018-12-08 ENCOUNTER — Ambulatory Visit (INDEPENDENT_AMBULATORY_CARE_PROVIDER_SITE_OTHER): Payer: Medicare Other | Admitting: Allergy and Immunology

## 2018-12-08 VITALS — BP 98/68 | HR 70 | Temp 97.4°F | Resp 18 | Ht 63.8 in | Wt 153.8 lb

## 2018-12-08 DIAGNOSIS — L2089 Other atopic dermatitis: Secondary | ICD-10-CM | POA: Diagnosis not present

## 2018-12-08 DIAGNOSIS — L209 Atopic dermatitis, unspecified: Secondary | ICD-10-CM | POA: Insufficient documentation

## 2018-12-08 DIAGNOSIS — J4540 Moderate persistent asthma, uncomplicated: Secondary | ICD-10-CM | POA: Diagnosis not present

## 2018-12-08 DIAGNOSIS — H1010 Acute atopic conjunctivitis, unspecified eye: Secondary | ICD-10-CM | POA: Insufficient documentation

## 2018-12-08 DIAGNOSIS — S50861D Insect bite (nonvenomous) of right forearm, subsequent encounter: Secondary | ICD-10-CM | POA: Diagnosis not present

## 2018-12-08 DIAGNOSIS — W57XXXD Bitten or stung by nonvenomous insect and other nonvenomous arthropods, subsequent encounter: Secondary | ICD-10-CM

## 2018-12-08 DIAGNOSIS — W57XXXA Bitten or stung by nonvenomous insect and other nonvenomous arthropods, initial encounter: Secondary | ICD-10-CM | POA: Insufficient documentation

## 2018-12-08 DIAGNOSIS — R040 Epistaxis: Secondary | ICD-10-CM | POA: Diagnosis not present

## 2018-12-08 DIAGNOSIS — J3089 Other allergic rhinitis: Secondary | ICD-10-CM | POA: Insufficient documentation

## 2018-12-08 DIAGNOSIS — L500 Allergic urticaria: Secondary | ICD-10-CM | POA: Diagnosis not present

## 2018-12-08 DIAGNOSIS — H1013 Acute atopic conjunctivitis, bilateral: Secondary | ICD-10-CM | POA: Diagnosis not present

## 2018-12-08 MED ORDER — MONTELUKAST SODIUM 10 MG PO TABS: 10.0000 mg | ORAL_TABLET | Freq: Every day | ORAL | 5 refills | Status: DC

## 2018-12-08 MED ORDER — ALBUTEROL SULFATE HFA 108 (90 BASE) MCG/ACT IN AERS: INHALATION_SPRAY | RESPIRATORY_TRACT | 5 refills | Status: DC

## 2018-12-08 MED ORDER — ALBUTEROL SULFATE (2.5 MG/3ML) 0.083% IN NEBU: 2.5000 mg | INHALATION_SOLUTION | RESPIRATORY_TRACT | 1 refills | Status: DC | PRN

## 2018-12-08 MED ORDER — AZELASTINE HCL 0.15 % NA SOLN: NASAL | 5 refills | Status: DC

## 2018-12-08 MED ORDER — LEVOCETIRIZINE DIHYDROCHLORIDE 5 MG PO TABS: ORAL_TABLET | ORAL | 5 refills | Status: DC

## 2018-12-08 MED ORDER — BUDESONIDE-FORMOTEROL FUMARATE 160-4.5 MCG/ACT IN AERO: 2.0000 | INHALATION_SPRAY | Freq: Two times a day (BID) | RESPIRATORY_TRACT | 5 refills | Status: DC

## 2018-12-08 MED ORDER — OLOPATADINE HCL 0.2 % OP SOLN: OPHTHALMIC | 5 refills | Status: AC

## 2018-12-08 NOTE — Assessment & Plan Note (Addendum)
   Avoid nasal steroid sprays.  Proper technique for stanching epistaxis has been discussed and demonstrated.  Nasal saline spray and/or nasal saline gel is recommended to moisturize nasal mucosa.  The use of a cool-mist humidifier during the night is recommended.  During epistaxis, if needed, oxymetazoline (Afrin) nasal spray may be applied to a cotton ball to help stanch the blood flow.  If this problem persists or progresses, otolaryngology evaluation may be warranted.

## 2018-12-08 NOTE — Assessment & Plan Note (Signed)
Jaidon's history suggests Skeeter Syndrome.   Information regarding Skeeter Syndrome has been discussed.  Recommedations have been provided regarding mosquito avoidance and early treatment with ice, antihistamines, topical corticosteroids and antiinflammatories.

## 2018-12-08 NOTE — Assessment & Plan Note (Signed)
   Treatment plan as outlined above for allergic rhinitis.  A prescription has been provided for Pataday, one drop per eye daily as needed.  I have also recommended eye lubricant drops (i.e., Natural Tears) as needed. 

## 2018-12-08 NOTE — Assessment & Plan Note (Addendum)
   Aeroallergen avoidance measures have been discussed and provided in written form.  Nasal saline spray (i.e., Simply Saline) or nasal saline lavage (i.e., NeilMed) is recommended as needed and prior to medicated nasal sprays.  A prescription has been provided for azelastine nasal spray, 1-2 sprays per nostril 2 times daily as needed. Proper nasal spray technique has been discussed and demonstrated.   A prescription has been provided for levocetirizine 5 mg daily as needed.

## 2018-12-08 NOTE — Assessment & Plan Note (Addendum)
   Continue appropriate skin care measures and topical corticosteroid as prescribed by dermatologist.

## 2018-12-08 NOTE — Patient Instructions (Addendum)
Moderate persistent asthma  A prescription has been provided for Symbicort (budesonide/formoterol) 160/4.5 g, 2 inhalations twice a day. To maximize pulmonary deposition, a spacer has been provided along with instructions for its proper administration with an HFA inhaler.  A prescription has been provided for montelukast 10 mg daily at bedtime.  The potential side effects of montelukast have been discussed and the patient has verbalized understanding.  Continue albuterol HFA, 1 to 2 inhalations every 4-6 hours if needed.  A prescription has been provided for albuterol 0.083% via nebulizer every 4-6 hours if needed.  A nebulizer has been provided.  Subjective and objective measures of pulmonary function will be followed and the treatment plan will be adjusted accordingly.  Perennial allergic rhinitis with predominantly nonallergic component  Aeroallergen avoidance measures have been discussed and provided in written form.  Nasal saline spray (i.e., Simply Saline) or nasal saline lavage (i.e., NeilMed) is recommended as needed and prior to medicated nasal sprays.  A prescription has been provided for azelastine nasal spray, 1-2 sprays per nostril 2 times daily as needed. Proper nasal spray technique has been discussed and demonstrated.   A prescription has been provided for levocetirizine 5 mg daily as needed.  Allergic conjunctivitis  Treatment plan as outlined above for allergic rhinitis.  A prescription has been provided for Pataday, one drop per eye daily as needed.  I have also recommended eye lubricant drops (i.e., Natural Tears) as needed.  History of epistaxis  Avoid nasal steroid sprays.  Proper technique for stanching epistaxis has been discussed and demonstrated.  Nasal saline spray and/or nasal saline gel is recommended to moisturize nasal mucosa.  The use of a cool-mist humidifier during the night is recommended.  During epistaxis, if needed, oxymetazoline (Afrin)  nasal spray may be applied to a cotton ball to help stanch the blood flow.  If this problem persists or progresses, otolaryngology evaluation may be warranted.   Atopic dermatitis  Continue appropriate skin care measures and topical corticosteroid as prescribed by dermatologist.  Recurrent urticaria The patient's history suggests that the urticaria may be secondary to the oncology immunotherapy.  Levocetirizine has been prescribed (as above).  Montelukast has been prescribed (as above).  Should significant or new symptoms, a journal is to be kept recording any foods eaten, beverages consumed, and medications taken within a 6 hour time period prior to the onset of symptoms, as well as record activities being performed, and environmental conditions. For any symptoms concerning for anaphylaxis and 911 is to be called immediately.  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   Information regarding Skeeter Syndrome has been discussed.  Recommedations have been provided regarding mosquito avoidance and early treatment with ice, antihistamines, topical corticosteroids and antiinflammatories.   Return in about 3 months (around 03/10/2019), or if symptoms worsen or fail to improve.  Urticaria (Hives)  . Levocetirizine (Xyzal) 5 mg twice a day and famotidine (Pepcid) 20 mg twice a day. If no symptoms for 7-14 days then decrease to. . Levocetirizine (Xyzal) 5 mg twice a day and famotidine (Pepcid) 20 mg once a day.  If no symptoms for 7-14 days then decrease to. . Levocetirizine (Xyzal) 5 mg twice a day.  If no symptoms for 7-14 days then decrease to. . Levocetirizine (Xyzal) 5 mg once a day.  May use Benadryl (diphenhydramine) as needed for breakthrough symptoms       If symptoms return, then step up dosage   Control of Mold Allergen  Mold and fungi can grow  on a variety of surfaces provided certain temperature and moisture conditions exist.  Outdoor molds grow on  plants, decaying vegetation and soil.  The major outdoor mold, Alternaria and Cladosporium, are found in very high numbers during hot and dry conditions.  Generally, a late Summer - Fall peak is seen for common outdoor fungal spores.  Rain will temporarily lower outdoor mold spore count, but counts rise rapidly when the rainy period ends.  The most important indoor molds are Aspergillus and Penicillium.  Dark, humid and poorly ventilated basements are ideal sites for mold growth.  The next most common sites of mold growth are the bathroom and the kitchen.  Outdoor [employer] 1. Use air conditioning and keep windows closed 2. Avoid exposure to decaying vegetation. 3. Avoid leaf raking. 4. Avoid grain handling. 5. Consider wearing a face mask if working in moldy areas.  Indoor Mold Control 1. Maintain humidity below 50%. 2. Clean washable surfaces with 5% bleach solution. 3. Remove sources e.g. Contaminated carpets.  Skeeter Syndrome Treatment   Mosquito avoidance (see information below)  Ice affected area  Oral antihistamine (Benadryl or Zyrtec)  Oral anti-inflammatory (ibuprofen)  Topical corticosteroid (Hydrocortisone cream 1%)    Strategies for Safer Mosquito Avoidance  by [place] are a terrible nuisance in the muggy summer months, especially now that the ferocious Asian tiger mosquito has made a permanent home here in [place]. The arrival of [place] virus has added some urgency to mosquito control measures, but spray programs and many repellents may do more harm than good in the long term. Choosing the least-toxic solutions can protect both your health and comfort in mosquito season. Here are some suggestions for safer and more effective bite avoidance this summer.   Population Control  Keeping mosquito populations in check is the most important way to avoid bites. It's no secret that removing sources of standing water is crucial to eliminating  mosquito breeding grounds. Common breeding sites to watch for include:  * Rain gutters. Clean them out and offer to do the same for elderly neighbors or others who may not be able to do the job themselves. Remember that mosquito control is a community-wide effort.  * Flowerpots, buckets and old tires. Be sure empty containers cannot hold water.  * Bird baths and pet dishes. Empty and clean them weekly.  * Recycling bins and the cans inside. These may harbor stagnant water if not emptied regularly.  * Rain barrels. Be sure they are sealed off from mosquitoes.  * Storm drains. Watch for clogs from branches and garbage.  Insecticide sprays targeting adult mosquitoes can only reduce mosquito populations for a day or two. In fact, since insecticides also kill off important mosquito predators such as dragonflies, a spray program can actually be counter-productive by leaving the rebounding mosquito population without natural enemies.  Instead, interrupt the breeding cycle by using the nontoxic bacterial larvicide Bacillus thuringiensis var. israelensis (Bti). Bti is sold in convenient donuts called "mosquito dunks" that you can safely use in your bird bath, rain barrel or low areas around your yard to kill mosquito larvae before the adults emerge and spread throughout the community, where they become much harder to kill. Bti is not harmful to fish, birds or mammals, and single applications can remain effective for a month or more, even if the water source dries out and refills.   Safer Repellents  If you'll be outdoors at dawn or dusk when mosquitoes are most  active, wear long clothes that don't leave skin exposed. (You may use insect repellent on your clothes). When you do get bites, soothe them by slathering on an astringent such as witch hazel after you come inside - it will prevent scratching and allow bites to heal quickly.  Lately many public health officials concerned about [place] virus have been  advising people to use repellents containing the pesticide DEET (N,N-diethyl-meta-toluamide). While DEET is an extremely effective mosquito repellent, it is also a neurotoxin, and studies have shown that prolonged frequent exposure can irritate skin, cause muscle twitching and weakness and harm the brain and nervous system, especially when combined with other pesticides such as permethrin.  Consumer studies report that [employer]'s Skin-So-Soft and herbal repellents containing citronella can be just as effective as DEET at repelling mosquitoes but need to be applied more often. The solution is to choose the safer formulas and reapply as needed.  General guidelines for using any insect repellent:  * Choose oils or lotions rather than sprays, which produce fine particles that are easily inhaled.  * Do not apply repellents to broken skin.  * Do not allow children to apply their own repellent, and do not apply repellents containing DEET or other pesticides directly to children's skin. If you use such products, they can be applied to children's clothing instead.  * Do not use sunscreen/repellent combinations. Sunscreen needs to be reapplied more often than repellents, so the combination products can result in overexposure to pesticides.  * Wash off all repellent from skin and clothing immediately after coming indoors.  Area-wide repellent strategies can also be effective for outdoor gatherings. There are various contraptions available that emit carbon dioxide to trap mosquitoes (such as the Mosquito Magnet and Mosquito Deleto). These are expensive, but they do work, and some companies will even rent them to you for an outdoor event. Citronella candles are also effective when there is no breeze, but beware of candles containing pesticides - the smoke is easily inhaled and can irritate the airway. Placing fans around your porch or patio can blow mosquitoes away.  Keep in mind that only female mosquitoes actually bite  and that most mosquito species in this area do not transmit West Nile virus. You are most at risk of being bitten by a mosquito carrying the disease at dawn and dusk, and even in these cases your chances of actually contracting the virus are extremely low. So take sensible steps to keep the buggers under control, but also keep them in perspective as the annoyances they are.

## 2018-12-08 NOTE — Assessment & Plan Note (Addendum)
   A prescription has been provided for Symbicort (budesonide/formoterol) 160/4.5 g, 2 inhalations twice a day. To maximize pulmonary deposition, a spacer has been provided along with instructions for its proper administration with an HFA inhaler.  A prescription has been provided for montelukast 10 mg daily at bedtime.  The potential side effects of montelukast have been discussed and the patient has verbalized understanding.  Continue albuterol HFA, 1 to 2 inhalations every 4-6 hours if needed.  A prescription has been provided for albuterol 0.083% via nebulizer every 4-6 hours if needed.  A nebulizer has been provided.  Subjective and objective measures of pulmonary function will be followed and the treatment plan will be adjusted accordingly.

## 2018-12-08 NOTE — Progress Notes (Signed)
New Patient Note  RE: [name] MRN: [id number] DOB: 25-Nov-1955 Date of Office Visit: 12/08/2018  Referring provider: Burnis Medin, MD Primary care provider: Burnis Medin, MD  Chief Complaint: Asthma, Cough, Urticaria, and Rash   History of present illness: [name] is a 63 y.o. female seen today in consultation requested by [name], MD.  She reports that she has had asthma since childhood, however he has been symptoms improved into adulthood.  However, she reports that her "asthma return" in 2016.  She reports that her asthma is triggered by season change and rapid weather change.  She reports that recently she has been experiencing asthma symptoms every day as well as nocturnal awakenings due to lower respiratory symptoms every night.  She was prescribed Brio Ellipta recently, however the cost of the medication was $400 per month, therefore she did not pick this medication up.   She also complains of a rash which started in 2015.  The rash is described as red, raised, and pruritic and "almost like hives."  The rash is so pruritic that she states "it is driving (her) crazy."  She reports that the rash started shortly after starting oncogenic immunotherapy, ipilimumab, for stage IV melanoma.  She also notes that the rash seems to flare when she is under emotional stress.  Otherwise, no specific medication, food, skin care product, detergent, soap, or other environmental triggers have been identified.  Regarding the melanoma, she is currently tumor free. The patient has eczema on her hands.  She reports that she was recently prescribed what her dermatologist called, "the mac-daddy" of topical corticosteroids. She notes that she experiences large local reactions with mosquito bites.  She does not experience concomitant generalized urticaria, cardiopulmonary symptoms, or GI symptoms. [name] experiences nasal congestion, rhinorrhea, and postnasal drainage.  These symptoms are  most frequent and severe with seasonal changes and rapid weather changes.  She attempts to control the symptoms with nasal saline rinse as well as menthol vapor.  She reports that she has a history of recurrent epistaxis.  Assessment and plan: Moderate persistent asthma  A prescription has been provided for Symbicort (budesonide/formoterol) 160/4.5 g, 2 inhalations twice a day. To maximize pulmonary deposition, a spacer has been provided along with instructions for its proper administration with an HFA inhaler.  A prescription has been provided for montelukast 10 mg daily at bedtime.  The potential side effects of montelukast have been discussed and the patient has verbalized understanding.  Continue albuterol HFA, 1 to 2 inhalations every 4-6 hours if needed.  A prescription has been provided for albuterol 0.083% via nebulizer every 4-6 hours if needed.  A nebulizer has been provided.  Subjective and objective measures of pulmonary function will be followed and the treatment plan will be adjusted accordingly.  Perennial allergic rhinitis with predominantly nonallergic component  Aeroallergen avoidance measures have been discussed and provided in written form.  Nasal saline spray (i.e., Simply Saline) or nasal saline lavage (i.e., NeilMed) is recommended as needed and prior to medicated nasal sprays.  A prescription has been provided for azelastine nasal spray, 1-2 sprays per nostril 2 times daily as needed. Proper nasal spray technique has been discussed and demonstrated.   A prescription has been provided for levocetirizine 5 mg daily as needed.  Allergic conjunctivitis  Treatment plan as outlined above for allergic rhinitis.  A prescription has been provided for Pataday, one drop per eye daily as needed.  I have also recommended eye lubricant  drops (i.e., Natural Tears) as needed.  History of epistaxis  Avoid nasal steroid sprays.  Proper technique for stanching epistaxis has  been discussed and demonstrated.  Nasal saline spray and/or nasal saline gel is recommended to moisturize nasal mucosa.  The use of a cool-mist humidifier during the night is recommended.  During epistaxis, if needed, oxymetazoline (Afrin) nasal spray may be applied to a cotton ball to help stanch the blood flow.  If this problem persists or progresses, otolaryngology evaluation may be warranted.   Atopic dermatitis  Continue appropriate skin care measures and topical corticosteroid as prescribed by dermatologist.  Recurrent urticaria The patient's history suggests that the urticaria may be secondary to the oncology immunotherapy.  Levocetirizine has been prescribed (as above).  Montelukast has been prescribed (as above).  Should significant or new symptoms, a journal is to be kept recording any foods eaten, beverages consumed, and medications taken within a 6 hour time period prior to the onset of symptoms, as well as record activities being performed, and environmental conditions. For any symptoms concerning for anaphylaxis and 911 is to be called immediately.  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   Information regarding Skeeter Syndrome has been discussed.  Recommedations have been provided regarding mosquito avoidance and early treatment with ice, antihistamines, topical corticosteroids and antiinflammatories.   Meds ordered this encounter  Medications  . budesonide-formoterol (SYMBICORT) 160-4.5 MCG/ACT inhaler    Sig: Inhale 2 puffs into the lungs 2 (two) times daily. Use with spacer.    Dispense:  1 Inhaler    Refill:  5  . montelukast (SINGULAIR) 10 MG tablet    Sig: Take 1 tablet (10 mg total) by mouth at bedtime.    Dispense:  30 tablet    Refill:  5  . albuterol (VENTOLIN HFA) 108 (90 Base) MCG/ACT inhaler    Sig: Use 1-2 puffs every 4-6 hours if needed for cough or wheeze.    Dispense:  18 g    Refill:  5  . albuterol (PROVENTIL) (2.5  MG/3ML) 0.083% nebulizer solution    Sig: Take 3 mLs (2.5 mg total) by nebulization every 4 (four) hours as needed for wheezing or shortness of breath.    Dispense:  180 mL    Refill:  1  . Azelastine HCl 0.15 % SOLN    Sig: Use 1-2 sprays in each nostril 2 times daily as needed.    Dispense:  30 mL    Refill:  5  . levocetirizine (XYZAL) 5 MG tablet    Sig: Take one tablet daily as needed.    Dispense:  30 tablet    Refill:  5  . Olopatadine HCl (PATADAY) 0.2 % SOLN    Sig: Use one drop in each eye once daily as needed.    Dispense:  2.5 mL    Refill:  5    Diagnostics: Spirometry: Spirometry reveals an FVC of 2.98 L and an FEV1 of 2.50 L without significant postbronchodilator improvement.  This study was performed while the patient was asymptomatic.  Please see scanned spirometry results for details. Epicutaneous environmental testing: Negative despite a positive histamine control. Intradermal environmental testing: Positive to major mold mix #4. Food allergen skin testing: Negative despite a positive histamine control.    Physical examination: Blood pressure 98/68, pulse 70, temperature (!) 97.4 F (36.3 C), temperature source Temporal, resp. rate 18, height 5' 3.8" (1.621 m), weight 153 lb 12.8 oz (69.8 kg), SpO2 97 %.  General: Alert, interactive,  in no acute distress. HEENT: TMs pearly gray, turbinates moderately edematous with clear discharge, post-pharynx erythematous. Neck: Supple without lymphadenopathy. Lungs: Clear to auscultation without wheezing, rhonchi or rales. CV: Normal S1, S2 without murmurs. Abdomen: Nondistended, nontender. Skin: Warm and dry, without lesions or rashes. Extremities:  No clubbing, cyanosis or edema. Neuro:   Grossly intact.  Review of systems:  Review of systems negative except as noted in HPI / PMHx or noted below: Review of Systems  Constitutional: Negative.   HENT: Negative.   Eyes: Negative.   Respiratory: Negative.    Cardiovascular: Negative.   Gastrointestinal: Negative.   Genitourinary: Negative.   Musculoskeletal: Negative.   Skin: Negative.   Neurological: Negative.   Endo/Heme/Allergies: Negative.   Psychiatric/Behavioral: Negative.     Past medical history:  Past Medical History:  Diagnosis Date  . Angio-edema   . Arthritis   . Asthma   . Atrial fibrillation ([place])   . Atrial tachycardia ([place])   . Cervicalgia   . Complication of anesthesia    pt states wakes up with "shakes"  . CVA (cerebral infarction)    2012 with dizziness and vision change felt embolic from atrial tachy  . Disorder of bone and cartilage, unspecified   . Disturbance of skin sensation   . Diverticulosis   . DM (diabetes mellitus) ([place])   . DVT (deep venous thrombosis) ([place])   . DVT (deep venous thrombosis) ([place])   . Eczema   . Family history of anesthesia complication    PONV  . Fatty liver   . GERD (gastroesophageal reflux disease)   . History of hiatal hernia   . History of pacemaker   . History of radiation therapy 10/24/12   brain  . HLD (hyperlipidemia)   . HTN (hypertension)   . Hypoglycemia, unspecified   . 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 Liver metastasis ([place])   . Lung metastasis ([place])   . Metastasis to brain ([place])   . Osteopenia   . Other nonspecific abnormal serum enzyme levels   . Other specified congenital anomalies of nervous system   . Pacemaker    autonomic dysfunction  . Pancreatitis    from therapy  . Peripheral vascular disease ([place])   . Pneumonia   . PONV (postoperative nausea and vomiting)   . POTS (postural orthostatic tachycardia syndrome)   . Recurrent upper respiratory infection (URI)   . Skin cancer    Hx: of lung lesion  . Stroke [facility])    left weaker   . Swelling of limb   . Urticaria     Past surgical history:  Past Surgical History:  Procedure Laterality Date  . ABLATION SAPHENOUS VEIN W/ RFA     2002  . ADENOIDECTOMY    .  CARPAL TUNNEL RELEASE Left   . CHOLECYSTECTOMY  03/28/2018  . CHOLECYSTECTOMY N/A 03/28/2018   Procedure: LAPAROSCOPIC CHOLECYSTECTOMY WITH INTRAOPERATIVE CHOLANGIOGRAM;  Surgeon: Johnathan Hausen, MD;  Location: [facility];  Service: General;  Laterality: N/A;  . CRANIOTOMY N/A 09/30/2012   suboccipital craniectomy  . CRANIOTOMY Left 02/09/2013   Procedure: LEFT PARIETAL CRANIOTOMY with stealth;  Surgeon: [name], MD;  Location: [facility];  Service: Neurosurgery;  Laterality: Left;  LEFT Parietal Craniotomy for tumor with stealth  . DEEP AXILLARY SENTINEL NODE BIOPSY / EXCISION     due to extensive Melanoma-right arm  . fatty tumor removed     from chest  . INSERT / REPLACE /  REMOVE PACEMAKER  2012   1999, x 3  . LAPAROSCOPY  09/06/2011   Procedure: LAPAROSCOPY OPERATIVE;  Surgeon: Claiborne Billings A. Pamala Hurry, MD;  Location: [facility];  Service: Gynecology;  Laterality: Left;  with Left Ovarian Cystectomy   . MELANOMA EXCISION     with removal of lymph nodes, left shoulder  . NOSE SURGERY  2010, 2012   for nose bleeds x 2  . PACEMAKER IMPLANT    . SINOSCOPY    . TONSILLECTOMY AND ADENOIDECTOMY      Family history: Family History  Problem Relation Age of Onset  . Allergic rhinitis Sister   . Stroke Mother   . Colon polyps Mother   . Colon cancer Mother   . Urticaria Mother   . Allergic rhinitis Mother   . Heart disease Father   . Diabetes Father   . Kidney disease Father   . Diabetes Maternal Grandmother   . Clotting disorder Maternal Grandmother        stroke  . Crohn's disease Maternal Grandmother   . Diabetes Paternal Grandmother   . Aneurysm Sister        brain    Social history: Social History   Socioeconomic History  . Marital status: Married    Spouse name: Not on file  . Number of children: 0  . Years of education: Not on file  . Highest education level: Not on file  Occupational History  . Occupation: PRESIDENT    Employer: [employer]: self  employed  Social Needs  . Financial resource strain: Not on file  . Food insecurity    Worry: Not on file    Inability: Not on file  . Transportation needs    Medical: Not on file    Non-medical: Not on file  Tobacco Use  . Smoking status: Never Smoker  . Smokeless tobacco: Never Used  Substance and Sexual Activity  . Alcohol use: Not Currently    Comment: glass of wine daily  . Drug use: No  . Sexual activity: Not on file  Lifestyle  . Physical activity    Days per week: Not on file    Minutes per session: Not on file  . Stress: Not on file  Relationships  . Social Herbalist on phone: Not on file    Gets together: Not on file    Attends religious service: Not on file    Active member of club or organization: Not on file    Attends meetings of clubs or organizations: Not on file    Relationship status: Not on file  . Intimate partner violence    Fear of current or ex partner: Not on file    Emotionally abused: Not on file    Physically abused: Not on file    Forced sexual activity: Not on file  Other Topics Concern  . Not on file  Social History Narrative   4 years college hh of 2    Neg tad    Environmental History: The patient lives in a [address] year old townhouse with carpeting in the bedroom, gas heat, and central air.  There is no known mold/water damage in the home.  She is a non-smoker.  Current Outpatient Medications  Medication Sig Dispense Refill  . acetaminophen (TYLENOL) 500 MG tablet Take 500 mg by mouth at bedtime.     Marland Kitchen albuterol (VENTOLIN HFA) 108 (90 Base) MCG/ACT inhaler INHALE 2 PUFFS INTO THE LUNGS EVERY  4 (FOUR) HOURS AS NEEDED FOR WHEEZING OR SHORTNESS OF BREATH. 18 Inhaler 0  . betamethasone dipropionate 0.05 % cream APPLY TO BITES TWICE DAILY AS NEEDED    . bimatoprost (LATISSE) 0.03 % ophthalmic solution APPLY 1 DROP IN BOTH EYES ONCE A DAY    . calcium carbonate (TUMS - DOSED IN MG ELEMENTAL CALCIUM) 500 MG chewable tablet Chew 1  tablet by mouth daily as needed for indigestion or heartburn.     . cholestyramine (QUESTRAN) 4 g packet Take by mouth.    . clonazePAM (KLONOPIN) 1 MG tablet Take by mouth.    . cloNIDine (CATAPRES) 0.1 MG tablet Take 1 tablet (0.1 mg total) by mouth at bedtime. 90 tablet 3  . cloNIDine (CATAPRES) 0.1 MG tablet Take by mouth.    . cyanocobalamin (,VITAMIN B-12,) 1000 MCG/ML injection Inject 1 mL (1,000 mcg total) into the muscle every 30 (thirty) days. 12 mL 0  . diphenhydrAMINE (BENADRYL) 25 MG tablet Take 25 mg by mouth at bedtime as needed for allergies or sleep.     . fluorometholone (FML) 0.1 % ophthalmic suspension Place 1 drop into both eyes 4 (four) times daily as needed (itching).   1  . fluorometholone (FML) 0.1 % ophthalmic suspension Administer 1 drop to both eyes 4 (four) times a day as needed.    . fluticasone (FLONASE) 50 MCG/ACT nasal spray Place 2 sprays into both nostrils daily. 16 g 12  . fluticasone furoate-vilanterol (BREO ELLIPTA) 100-25 MCG/INH AEPB Inhale 1 puff into the lungs daily. For wheezing suppression 1 each 3  . hydrocortisone (ANUSOL-HC) 25 MG suppository Place 1 suppository (25 mg total) rectally at bedtime as needed for hemorrhoids. (Patient taking differently: Place 25 mg rectally at bedtime as needed for hemorrhoids. ) 12 suppository 0  . hydrocortisone 2.5 % cream Apply 1 application topically 2 (two) times daily as needed (irritation). 30 g 0  . hyoscyamine (LEVSIN SL) 0.125 MG SL tablet Place under the tongue.    Marland Kitchen ibuprofen (ADVIL,MOTRIN) 200 MG tablet Take 200-400 mg by mouth 3 (three) times daily as needed for moderate pain.     Marland Kitchen levETIRAcetam (KEPPRA) 250 MG tablet Take 250 mg by mouth 3 (three) times daily. 6 am, 12 pm, 6 pm    . lipase/protease/amylase (CREON) 12000 units CPEP capsule Take 12,000 Units by mouth daily with breakfast.    . Loteprednol-Tobramycin (ZYLET) 0.5-0.3 % SUSP Administer 1 drop to the right eye daily as needed.    . metoprolol  tartrate (LOPRESSOR) 25 MG tablet TAKE 1 TABLET (25 MG TOTAL) BY MOUTH 2 (TWO) TIMES DAILY. 180 tablet 3  . nystatin (MYCOSTATIN) 100000 UNIT/ML suspension Use as directed 5 mLs in the mouth or throat 3 (three) times daily as needed (thrush).    . polyethylene glycol (MIRALAX / GLYCOLAX) packet Take 17 g by mouth at bedtime.    . potassium chloride SA (K-DUR,KLOR-CON) 20 MEQ tablet Take 1 tablet (20 mEq total) by mouth 2 (two) times daily. 60 tablet 3  . potassium chloride SA (KLOR-CON) 20 MEQ tablet Take by mouth.    . sodium chloride (OCEAN) 0.65 % SOLN nasal spray Place 1 spray into both nostrils as needed for congestion.     . Syringe/Needle, Disp, (SYRINGE 3CC/27GX1-1/4") 27G X 1-1/4" 3 ML MISC Inject 1 mL into the muscle every 30 (thirty) days. 12 each 0  . Vitamin D, Ergocalciferol, (DRISDOL) 50000 units CAPS capsule TAKE 1 CAPSULE (50,000 UNITS TOTAL) BY MOUTH EVERY 7 (  SEVEN) DAYS. (Patient taking differently: Take 50,000 Units by mouth See admin instructions. Take 50000 units by mouth every Mon, Wed, and Sat) 4 capsule 11  . albuterol (PROVENTIL) (2.5 MG/3ML) 0.083% nebulizer solution Take 3 mLs (2.5 mg total) by nebulization every 4 (four) hours as needed for wheezing or shortness of breath. 180 mL 1  . albuterol (VENTOLIN HFA) 108 (90 Base) MCG/ACT inhaler Use 1-2 puffs every 4-6 hours if needed for cough or wheeze. 18 g 5  . Azelastine HCl 0.15 % SOLN Use 1-2 sprays in each nostril 2 times daily as needed. 30 mL 5  . budesonide-formoterol (SYMBICORT) 160-4.5 MCG/ACT inhaler Inhale 2 puffs into the lungs 2 (two) times daily. Use with spacer. 1 Inhaler 5  . levocetirizine (XYZAL) 5 MG tablet Take one tablet daily as needed. 30 tablet 5  . montelukast (SINGULAIR) 10 MG tablet Take 1 tablet (10 mg total) by mouth at bedtime. 30 tablet 5  . Olopatadine HCl (PATADAY) 0.2 % SOLN Use one drop in each eye once daily as needed. 2.5 mL 5   No current facility-administered medications for this visit.      Known medication allergies: Allergies  Allergen Reactions  . Doxycycline Hives  . Erythromycin Hives  . Gadavist [Gadobutrol] Hives, Shortness Of Breath and Itching  . Other Hives    Cannot use  Cream or Suppositories  . Penicillins Hives    Did it involve swelling of the face/tongue/throat, SOB, or low BP? No Did it involve sudden or severe rash/hives, skin peeling, or any reaction on the inside of your mouth or nose? Yes Did you need to seek medical attention at a hospital or doctor's office? Yes When did it last happen?5+ years If all above answers are "NO", may proceed with cephalosporin use.   . Sulfa Antibiotics Hives  . Preparation H [Pramox-Pe-Glycerin-Petrolatum] Hives    Cannot use  Cream or Suppositories   . Sulfonamide Derivatives Hives  . Phenergan [Promethazine Hcl] Other (See Comments)    Causes restless leg syndrome    I appreciate the opportunity to take part in Jovanka's care. Please do not hesitate to contact me with questions.  Sincerely,   R. Edgar Frisk, MD

## 2018-12-08 NOTE — Assessment & Plan Note (Addendum)
The patient's history suggests that the urticaria may be secondary to the oncology immunotherapy.  Levocetirizine has been prescribed (as above).  Montelukast has been prescribed (as above).  Should significant or new symptoms, a journal is to be kept recording any foods eaten, beverages consumed, and medications taken within a 6 hour time period prior to the onset of symptoms, as well as record activities being performed, and environmental conditions. For any symptoms concerning for anaphylaxis and 911 is to be called immediately.

## 2018-12-12 ENCOUNTER — Other Ambulatory Visit: Payer: Self-pay

## 2018-12-12 DIAGNOSIS — J4540 Moderate persistent asthma, uncomplicated: Secondary | ICD-10-CM

## 2018-12-12 MED ORDER — ALBUTEROL SULFATE (2.5 MG/3ML) 0.083% IN NEBU: 2.5000 mg | INHALATION_SOLUTION | RESPIRATORY_TRACT | 1 refills | Status: DC | PRN

## 2018-12-15 ENCOUNTER — Other Ambulatory Visit: Payer: Self-pay | Admitting: Radiation Oncology

## 2019-01-06 DIAGNOSIS — C7931 Secondary malignant neoplasm of brain: Secondary | ICD-10-CM | POA: Diagnosis not present

## 2019-01-06 DIAGNOSIS — C4359 Malignant melanoma of other part of trunk: Secondary | ICD-10-CM | POA: Diagnosis not present

## 2019-01-06 DIAGNOSIS — Z6827 Body mass index (BMI) 27.0-27.9, adult: Secondary | ICD-10-CM | POA: Diagnosis not present

## 2019-01-06 DIAGNOSIS — Z800 Family history of malignant neoplasm of digestive organs: Secondary | ICD-10-CM | POA: Diagnosis not present

## 2019-01-06 DIAGNOSIS — J302 Other seasonal allergic rhinitis: Secondary | ICD-10-CM | POA: Diagnosis not present

## 2019-01-06 DIAGNOSIS — G909 Disorder of the autonomic nervous system, unspecified: Secondary | ICD-10-CM | POA: Diagnosis not present

## 2019-01-06 DIAGNOSIS — C439 Malignant melanoma of skin, unspecified: Secondary | ICD-10-CM | POA: Diagnosis not present

## 2019-01-06 DIAGNOSIS — C787 Secondary malignant neoplasm of liver and intrahepatic bile duct: Secondary | ICD-10-CM | POA: Diagnosis not present

## 2019-01-06 DIAGNOSIS — Z9289 Personal history of other medical treatment: Secondary | ICD-10-CM | POA: Diagnosis not present

## 2019-01-06 DIAGNOSIS — J45909 Unspecified asthma, uncomplicated: Secondary | ICD-10-CM | POA: Diagnosis not present

## 2019-01-06 DIAGNOSIS — K580 Irritable bowel syndrome with diarrhea: Secondary | ICD-10-CM | POA: Diagnosis not present

## 2019-01-06 DIAGNOSIS — I4891 Unspecified atrial fibrillation: Secondary | ICD-10-CM | POA: Diagnosis not present

## 2019-01-06 DIAGNOSIS — C7800 Secondary malignant neoplasm of unspecified lung: Secondary | ICD-10-CM | POA: Diagnosis not present

## 2019-01-06 DIAGNOSIS — Z950 Presence of cardiac pacemaker: Secondary | ICD-10-CM | POA: Diagnosis not present

## 2019-01-06 DIAGNOSIS — Z79899 Other long term (current) drug therapy: Secondary | ICD-10-CM | POA: Diagnosis not present

## 2019-01-08 ENCOUNTER — Telehealth: Payer: Self-pay | Admitting: Internal Medicine

## 2019-01-08 NOTE — Telephone Encounter (Signed)
Dr Caryl Comes supports these procedures, will route to him

## 2019-01-08 NOTE — Telephone Encounter (Signed)
Patient is due for a brain MRI and [name] needs to coordinate this appointment with Dr. Caryl Comes due to her having a pace maker.

## 2019-01-09 ENCOUNTER — Other Ambulatory Visit: Payer: Self-pay | Admitting: Radiation Therapy

## 2019-01-09 ENCOUNTER — Other Ambulatory Visit (HOSPITAL_COMMUNITY): Payer: Self-pay | Admitting: Internal Medicine

## 2019-01-09 ENCOUNTER — Encounter: Payer: Self-pay | Admitting: Radiation Therapy

## 2019-01-09 DIAGNOSIS — C7931 Secondary malignant neoplasm of brain: Secondary | ICD-10-CM

## 2019-01-09 NOTE — Progress Notes (Signed)
[facility] order faxed to radiology scheduling and MRI informed of [name] situation.   Dr. Caryl Comes comes to observe while [name] is scanned. He is asking that we schedule her the week of 11/30, either Mon, Tuesday or Thursday that week. He also would like the time to be 5:30pm since he is coming straight from clinic to be there.   Approval has to be given by [name] before this can be scheduled.   **Brain MRI with and without contrast using the SRS protocol.**    [name] R.T.(R)(T) Radiation Special Procedures Navigator

## 2019-01-12 ENCOUNTER — Other Ambulatory Visit (HOSPITAL_COMMUNITY): Payer: Self-pay | Admitting: Internal Medicine

## 2019-01-12 ENCOUNTER — Other Ambulatory Visit: Payer: Self-pay | Admitting: Radiation Therapy

## 2019-01-12 DIAGNOSIS — C7931 Secondary malignant neoplasm of brain: Secondary | ICD-10-CM

## 2019-01-16 ENCOUNTER — Encounter: Payer: Self-pay | Admitting: Radiation Therapy

## 2019-01-16 NOTE — Progress Notes (Signed)
[name] is allergic to the [facility] MRI contrast and had a significant reaction when scanned in 2019. In the past, when given multihance, she has not had a reaction, so arrangements have been made for multihance to be given rather than Gadavist for her 01/26/19 scan. Richarda Overlie, MRI supervisor, has the multihance and a pre-med Rx has been sent in by the ordering [facility] MD as well.  Dr. Caryl Comes is also aware of her appointment and will be there by 5:30pm to observe the procedure due to her pacemaker.   Mont Dutton R.T.(R)(T( Radiation Special Procedures Navigator

## 2019-01-16 NOTE — Telephone Encounter (Signed)
Per call from the [place] miss Delbert Phenix wants Dr Lynelle Doctor to know that the MRI is scheduled for Nov 30th 2020 he needs to be there by 5:30p please.

## 2019-01-26 ENCOUNTER — Telehealth: Payer: Self-pay | Admitting: Internal Medicine

## 2019-01-26 ENCOUNTER — Ambulatory Visit (HOSPITAL_COMMUNITY): Admission: RE | Admit: 2019-01-26 | Payer: Medicare Other | Source: Ambulatory Visit

## 2019-01-26 ENCOUNTER — Encounter (HOSPITAL_COMMUNITY): Payer: Self-pay

## 2019-01-26 ENCOUNTER — Encounter: Payer: Self-pay | Admitting: Radiation Therapy

## 2019-01-26 NOTE — Progress Notes (Signed)
[name] has decided to do the MRI without contrast because the pre-med makes her very sleepy. She has also decided to cancel the scan for now due to the high Covid numbers. She plans to reschedule when the risk for exposure has decreased. I spoke with [name], MRI supervisor, and called Dr. Olin Pia office to make them aware of the cancellation.    Mont Dutton R.T. (R)(T)

## 2019-01-26 NOTE — Telephone Encounter (Signed)
[name] called to inform that Dr. Caryl Comes is set up to go to the hospital to look at the MRI today, but she has cancelled it.

## 2019-01-26 NOTE — Telephone Encounter (Signed)
Dr. Caryl Comes aware

## 2019-02-23 ENCOUNTER — Ambulatory Visit: Payer: Medicare Other | Attending: Internal Medicine

## 2019-02-23 DIAGNOSIS — Z20822 Contact with and (suspected) exposure to covid-19: Secondary | ICD-10-CM

## 2019-02-24 ENCOUNTER — Other Ambulatory Visit: Payer: Self-pay

## 2019-02-24 ENCOUNTER — Telehealth: Payer: Self-pay

## 2019-02-24 ENCOUNTER — Encounter: Payer: Self-pay | Admitting: Internal Medicine

## 2019-02-24 ENCOUNTER — Telehealth (INDEPENDENT_AMBULATORY_CARE_PROVIDER_SITE_OTHER): Payer: Medicare Other | Admitting: Internal Medicine

## 2019-02-24 DIAGNOSIS — Z20828 Contact with and (suspected) exposure to other viral communicable diseases: Secondary | ICD-10-CM

## 2019-02-24 DIAGNOSIS — L309 Dermatitis, unspecified: Secondary | ICD-10-CM | POA: Diagnosis not present

## 2019-02-24 DIAGNOSIS — Z20822 Contact with and (suspected) exposure to covid-19: Secondary | ICD-10-CM

## 2019-02-24 MED ORDER — BETAMETHASONE DIPROPIONATE AUG 0.05 % EX OINT: TOPICAL_OINTMENT | Freq: Two times a day (BID) | CUTANEOUS | 0 refills | Status: AC

## 2019-02-24 NOTE — Telephone Encounter (Signed)
Copied from [place] [id number]. Topic: Quick Communication - See Telephone Encounter >> Feb 24, 2019  8:27 AM Loma Boston wrote: CRM for notification. See Telephone encounter for: 02/24/19. No answer in  office, pls FU with pt about an appt (virtual with Panosh) at [phone]

## 2019-02-24 NOTE — Progress Notes (Signed)
Virtual Visit via Video Note  I connected with@ on 02/24/19 at  2:00 PM EST by a video enabled telemedicine application and verified that I am speaking with the correct person using two identifiers. Location patient: home Location provider: home office Persons participating in the virtual visit: patient, provider  [place] national recommendations  regarding COVID 19 pandemic   video visit is advised over in office visit for this patient.  Patient aware  of the limitations of evaluation and management by telemedicine and  availability of in person appointments. and agreed to proceed.   HPI: [name] presents for video visit is recently she had some headache which was probably from barometric pressure changes on Christmas eve but also some nausea and malaise and her clinical team suggested she get tested for Covid which she has today as well as for her husband who has no symptoms.  Has questions about this  Her cancer team states that she would be a candidate for a monoclonal antibody outpatient treatment. Also her "eczema" they cheek has gotten a lot worse on her neck on her fingers different areas of her body she has been tested for allergies in the past.  She has been given Topicort 0.025 ointment she has been using but the areas are still drying and itchy.  Asks about other topicals or systemic steroids .   ROS: See pertinent positives and negatives per HPI. No fever chills  Vomiting  No major cough uri sx   Past Medical History:  Diagnosis Date  . Angio-edema   . Arthritis   . Asthma   . Atrial fibrillation ([place])   . Atrial tachycardia ([place])   . Cervicalgia   . Complication of anesthesia    pt states wakes up with "shakes"  . CVA (cerebral infarction)    2012 with dizziness and vision change felt embolic from atrial tachy  . Disorder of bone and cartilage, unspecified   . Disturbance of skin sensation   . Diverticulosis   . DM (diabetes mellitus) ([place])   . DVT (deep  venous thrombosis) ([place])   . DVT (deep venous thrombosis) ([place])   . Eczema   . Family history of anesthesia complication    PONV  . Fatty liver   . GERD (gastroesophageal reflux disease)   . History of hiatal hernia   . History of pacemaker   . History of radiation therapy 10/24/12   brain  . HLD (hyperlipidemia)   . HTN (hypertension)   . Hypoglycemia, unspecified   . 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 Liver metastasis ([place])   . Lung metastasis ([place])   . Metastasis to brain ([place])   . Osteopenia   . Other nonspecific abnormal serum enzyme levels   . Other specified congenital anomalies of nervous system   . Pacemaker    autonomic dysfunction  . Pancreatitis    from therapy  . Peripheral vascular disease ([place])   . Pneumonia   . PONV (postoperative nausea and vomiting)   . POTS (postural orthostatic tachycardia syndrome)   . Recurrent upper respiratory infection (URI)   . Skin cancer    Hx: of lung lesion  . Stroke [facility])    left weaker   . Swelling of limb   . Urticaria     Past Surgical History:  Procedure Laterality Date  . ABLATION SAPHENOUS VEIN W/ RFA     2002  . ADENOIDECTOMY    . CARPAL TUNNEL  RELEASE Left   . CHOLECYSTECTOMY  03/28/2018  . CHOLECYSTECTOMY N/A 03/28/2018   Procedure: LAPAROSCOPIC CHOLECYSTECTOMY WITH INTRAOPERATIVE CHOLANGIOGRAM;  Surgeon: Johnathan Hausen, MD;  Location: [place];  Service: General;  Laterality: N/A;  . CRANIOTOMY N/A 09/30/2012   suboccipital craniectomy  . CRANIOTOMY Left 02/09/2013   Procedure: LEFT PARIETAL CRANIOTOMY with stealth;  Surgeon: [name], MD;  Location: [place] [facility];  Service: Neurosurgery;  Laterality: Left;  LEFT Parietal Craniotomy for tumor with stealth  . DEEP AXILLARY SENTINEL NODE BIOPSY / EXCISION     due to extensive Melanoma-right arm  . fatty tumor removed     from chest  . INSERT / REPLACE / REMOVE PACEMAKER  2012   1999, x 3  . LAPAROSCOPY  09/06/2011   Procedure:  LAPAROSCOPY OPERATIVE;  Surgeon: Claiborne Billings A. Pamala Hurry, MD;  Location: [place];  Service: Gynecology;  Laterality: Left;  with Left Ovarian Cystectomy   . MELANOMA EXCISION     with removal of lymph nodes, left shoulder  . NOSE SURGERY  2010, 2012   for nose bleeds x 2  . PACEMAKER IMPLANT    . SINOSCOPY    . TONSILLECTOMY AND ADENOIDECTOMY      Family History  Problem Relation Age of Onset  . Allergic rhinitis Sister   . Stroke Mother   . Colon polyps Mother   . Colon cancer Mother   . Urticaria Mother   . Allergic rhinitis Mother   . Heart disease Father   . Diabetes Father   . Kidney disease Father   . Diabetes Maternal Grandmother   . Clotting disorder Maternal Grandmother        stroke  . Crohn's disease Maternal Grandmother   . Diabetes Paternal Grandmother   . Aneurysm Sister        brain        Current Outpatient Medications:  .  acetaminophen (TYLENOL) 500 MG tablet, Take 500 mg by mouth at bedtime. , Disp: , Rfl:  .  albuterol (PROVENTIL) (2.5 MG/3ML) 0.083% nebulizer solution, Take 3 mLs (2.5 mg total) by nebulization every 4 (four) hours as needed for wheezing or shortness of breath., Disp: 180 mL, Rfl: 1 .  albuterol (VENTOLIN HFA) 108 (90 Base) MCG/ACT inhaler, INHALE 2 PUFFS INTO THE LUNGS EVERY 4 (FOUR) HOURS AS NEEDED FOR WHEEZING OR SHORTNESS OF BREATH., Disp: 18 Inhaler, Rfl: 0 .  albuterol (VENTOLIN HFA) 108 (90 Base) MCG/ACT inhaler, Use 1-2 puffs every 4-6 hours if needed for cough or wheeze., Disp: 18 g, Rfl: 5 .  augmented betamethasone dipropionate (DIPROLENE) 0.05 % ointment, Apply topically 2 (two) times daily. Limit to no longer than 2 weeks use. Not on face., Disp: 50 g, Rfl: 0 .  Azelastine HCl 0.15 % SOLN, Use 1-2 sprays in each nostril 2 times daily as needed., Disp: 30 mL, Rfl: 5 .  betamethasone dipropionate 0.05 % cream, APPLY TO BITES TWICE DAILY AS NEEDED, Disp: , Rfl:  .  bimatoprost (LATISSE) 0.03 % ophthalmic solution, APPLY 1 DROP IN BOTH  EYES ONCE A DAY, Disp: , Rfl:  .  budesonide-formoterol (SYMBICORT) 160-4.5 MCG/ACT inhaler, Inhale 2 puffs into the lungs 2 (two) times daily. Use with spacer., Disp: 1 Inhaler, Rfl: 5 .  calcium carbonate (TUMS - DOSED IN MG ELEMENTAL CALCIUM) 500 MG chewable tablet, Chew 1 tablet by mouth daily as needed for indigestion or heartburn. , Disp: , Rfl:  .  cholestyramine (QUESTRAN) 4 g packet, Take by mouth., Disp: ,  Rfl:  .  clonazePAM (KLONOPIN) 1 MG tablet, Take by mouth., Disp: , Rfl:  .  cloNIDine (CATAPRES) 0.1 MG tablet, Take 1 tablet (0.1 mg total) by mouth at bedtime., Disp: 90 tablet, Rfl: 3 .  cloNIDine (CATAPRES) 0.1 MG tablet, Take by mouth., Disp: , Rfl:  .  cyanocobalamin (,VITAMIN B-12,) 1000 MCG/ML injection, Inject 1 mL (1,000 mcg total) into the muscle every 30 (thirty) days., Disp: 12 mL, Rfl: 0 .  diphenhydrAMINE (BENADRYL) 25 MG tablet, Take 25 mg by mouth at bedtime as needed for allergies or sleep. , Disp: , Rfl:  .  fluorometholone (FML) 0.1 % ophthalmic suspension, Place 1 drop into both eyes 4 (four) times daily as needed (itching). , Disp: , Rfl: 1 .  fluorometholone (FML) 0.1 % ophthalmic suspension, Administer 1 drop to both eyes 4 (four) times a day as needed., Disp: , Rfl:  .  fluticasone (FLONASE) 50 MCG/ACT nasal spray, Place 2 sprays into both nostrils daily., Disp: 16 g, Rfl: 12 .  fluticasone furoate-vilanterol (BREO ELLIPTA) 100-25 MCG/INH AEPB, Inhale 1 puff into the lungs daily. For wheezing suppression, Disp: 1 each, Rfl: 3 .  hydrocortisone (ANUSOL-HC) 25 MG suppository, Place 1 suppository (25 mg total) rectally at bedtime as needed for hemorrhoids. (Patient taking differently: Place 25 mg rectally at bedtime as needed for hemorrhoids. ), Disp: 12 suppository, Rfl: 0 .  hydrocortisone 2.5 % cream, Apply 1 application topically 2 (two) times daily as needed (irritation)., Disp: 30 g, Rfl: 0 .  hyoscyamine (LEVSIN SL) 0.125 MG SL tablet, Place under the tongue.,  Disp: , Rfl:  .  ibuprofen (ADVIL,MOTRIN) 200 MG tablet, Take 200-400 mg by mouth 3 (three) times daily as needed for moderate pain. , Disp: , Rfl:  .  levETIRAcetam (KEPPRA) 250 MG tablet, Take 250 mg by mouth 3 (three) times daily. 6 am, 12 pm, 6 pm, Disp: , Rfl:  .  levocetirizine (XYZAL) 5 MG tablet, Take one tablet daily as needed., Disp: 30 tablet, Rfl: 5 .  lipase/protease/amylase (CREON) 12000 units CPEP capsule, Take 12,000 Units by mouth daily with breakfast., Disp: , Rfl:  .  Loteprednol-Tobramycin (ZYLET) 0.5-0.3 % SUSP, Administer 1 drop to the right eye daily as needed., Disp: , Rfl:  .  metoprolol tartrate (LOPRESSOR) 25 MG tablet, TAKE 1 TABLET (25 MG TOTAL) BY MOUTH 2 (TWO) TIMES DAILY., Disp: 180 tablet, Rfl: 3 .  montelukast (SINGULAIR) 10 MG tablet, Take 1 tablet (10 mg total) by mouth at bedtime., Disp: 30 tablet, Rfl: 5 .  nystatin (MYCOSTATIN) 100000 UNIT/ML suspension, Use as directed 5 mLs in the mouth or throat 3 (three) times daily as needed (thrush)., Disp: , Rfl:  .  Olopatadine HCl (PATADAY) 0.2 % SOLN, Use one drop in each eye once daily as needed., Disp: 2.5 mL, Rfl: 5 .  polyethylene glycol (MIRALAX / GLYCOLAX) packet, Take 17 g by mouth at bedtime., Disp: , Rfl:  .  potassium chloride SA (K-DUR,KLOR-CON) 20 MEQ tablet, Take 1 tablet (20 mEq total) by mouth 2 (two) times daily., Disp: 60 tablet, Rfl: 3 .  potassium chloride SA (KLOR-CON) 20 MEQ tablet, Take by mouth., Disp: , Rfl:  .  sodium chloride (OCEAN) 0.65 % SOLN nasal spray, Place 1 spray into both nostrils as needed for congestion. , Disp: , Rfl:  .  Syringe/Needle, Disp, (SYRINGE 3CC/27GX1-1/4") 27G X 1-1/4" 3 ML MISC, Inject 1 mL into the muscle every 30 (thirty) days., Disp: 12 each, Rfl: 0 .  Vitamin D, Ergocalciferol, (DRISDOL) 50000 units CAPS capsule, TAKE 1 CAPSULE (50,000 UNITS TOTAL) BY MOUTH EVERY 7 (SEVEN) DAYS. (Patient taking differently: Take 50,000 Units by mouth See admin instructions. Take  50000 units by mouth every Mon, Wed, and Sat), Disp: 4 capsule, Rfl: 11  EXAM: BP Readings from Last 3 Encounters:  12/08/18 98/68  03/29/18 119/69  03/25/18 122/80    VITALS per patient if applicable:  GENERAL: alert, oriented, appears well and in no acute distress  HEENT: atraumatic, conjunttiva clear, no obvious abnormalities on inspection of external nose and ears  NECK: normal movements of the head and neck  LUNGS: on inspection no signs of respiratory distress, breathing rate appears normal, no obvious gross SOB, gasping or wheezing  CV: no obvious cyanosis Skin  Red pink patch left neck  No edema face clear  MS: moves all visible extremities without noticeable abnormality  PSYCH/NEURO: pleasant and cooperative, no obvious depression or anxiety, speech and thought processing grossly intact   ASSESSMENT AND PLAN:  Discussed the following assessment and plan:    ICD-10-CM   1. COVID-19 virus test result unknown  Z20.828    pending  with nausea and headache malaise no fever  high risk. med team adv monocolonol aby if positive .   2. Eczema, unspecified type  L30.9    felt aggravated from underyling immunisuppressive therapy level 1 limted to try consult with Derm if not congtrolled other modalities   high risk underlying  Conditions  But ranks as a 3 in EHR  And team says immunocompromised   And not documented as such  Reviewed alarm symptoms for Covid especially shortness of breath or desaturation patient does have pulse ox at home usually runs 98.  And await Covid results and communicated the protocol of hospital system calling high risk patients who are candidates for the monoclonal antibody.  However she can also contact us if information is needed to relate.   husband declines immunization at this time   Regard to her skin condition she is on a level 2 steroid ointment and ointments are the least drying can offer her a 2-week level 1 potency to limit to 2 weeks and not  use on face.  This may not be successful but would avoid systemic steroids at this time further evaluation or treatment should be done through her dermatologist her cancer team further options.  Counseled.   Expectant management and discussion of plan and treatment with opportunity to ask questions and all were answered. The patient agreed with the plan and demonstrated an understanding of the instructions.   Advised to call back or seek an in-person evaluation if worsening  or having  further concerns . Return if symptoms worsen or fail to improve, for depending on results .  Total visit 72mins > 50% spent counseling and coordinating care as indicated in above note and in instructions to patient .     [name], MD

## 2019-02-24 NOTE — Telephone Encounter (Signed)
Pt has been scheduled.  °

## 2019-02-25 LAB — NOVEL CORONAVIRUS, NAA: SARS-CoV-2, NAA: NOT DETECTED

## 2019-03-17 ENCOUNTER — Telehealth: Payer: Self-pay | Admitting: Radiation Therapy

## 2019-03-17 NOTE — Telephone Encounter (Signed)
[name] called to see what her options are for receiving the Covid 19 vaccine that do not include [place]. Unfortunately, that is the only current option for [facility]. Per [name], Dr. Ida Rogue PA, the only other county that is doing  drive through vaccinations is [facility] in [place], but it is first come first serve and only on Fridays. I let [name] know that [facility] web-site has more info on the timing of their clinics if she is interested in that option.   After sharing this information with [name], she has decided to reach out to Dr. Collichio's office to see what they have to offer as well. She is very interested in getting the vaccine, but wants to do it in a way that does not expose her to large crowds.    Mont Dutton R.T.(R)(T) Radiation Special Procedures Navigator

## 2019-03-31 DIAGNOSIS — Z8589 Personal history of malignant neoplasm of other organs and systems: Secondary | ICD-10-CM | POA: Diagnosis not present

## 2019-03-31 DIAGNOSIS — R040 Epistaxis: Secondary | ICD-10-CM | POA: Diagnosis not present

## 2019-04-20 ENCOUNTER — Telehealth: Payer: Self-pay | Admitting: Internal Medicine

## 2019-04-20 NOTE — Telephone Encounter (Signed)
Returning phone call to Mont Dutton at cancer center and advised request will be forwarded to Dr Caryl Comes.  Ms Delbert Phenix states they would like to do MRI within the next 3 weeks.  Will contact with schedule availability once Dr Caryl Comes provides.  Ms Laren Boom RN for assistance.

## 2019-04-20 NOTE — Telephone Encounter (Signed)
  [name] @ [place] is calling to check when Dr Caryl Comes would be available to attend an MRI for the patient.

## 2019-04-28 NOTE — Telephone Encounter (Signed)
Per Mont Dutton at cancer center, MRI will be scheduled for 05/07/2019 and pt will be ready by 0630pm.  Dr Caryl Comes is aware and agreeable to date and time.

## 2019-05-01 ENCOUNTER — Ambulatory Visit: Payer: Medicare Other | Attending: Internal Medicine

## 2019-05-01 DIAGNOSIS — Z23 Encounter for immunization: Secondary | ICD-10-CM

## 2019-05-01 NOTE — Progress Notes (Signed)
   Covid-19 Vaccination Clinic  Name:  MARCHETA HORSEY    MRN: [id number] DOB: 1955/10/15  05/01/2019  Ms. Sagen was observed post Covid-19 immunization for 30 minutes based on pre-vaccination screening without incident. She was provided with Vaccine Information Sheet and instruction to access the V-Safe system.   Ms. Espe was instructed to call 911 with any severe reactions post vaccine: Marland Kitchen Difficulty breathing  . Swelling of face and throat  . A fast heartbeat  . A bad rash all over body  . Dizziness and weakness   Immunizations Administered    Name Date Dose VIS Date Route   Pfizer COVID-19 Vaccine 05/01/2019 11:30 AM 0.3 mL 02/06/2019 Intramuscular   Manufacturer: [place]   Lot: [id number]   [place]: [id number]

## 2019-05-07 ENCOUNTER — Ambulatory Visit (HOSPITAL_COMMUNITY): Payer: Medicare Other

## 2019-05-07 ENCOUNTER — Telehealth: Payer: Self-pay | Admitting: Internal Medicine

## 2019-05-07 NOTE — Telephone Encounter (Signed)
Spoke with [name] at cancer center who states pt has canceled her MRI scheduled for today 05/08/2019 and would like to schedule the week of 05/18/2019.  Will have Dr Caryl Comes look at his schedule  and will contact [name] once he has selected another date.  [name] verbalizes understanding and agrees with current plan.

## 2019-05-07 NOTE — Telephone Encounter (Signed)
Patient has an MRI scheduled for tonight at 7pm, Dr. Caryl Comes is to be present but patient is wanting to cancel. [name], from the cancer center, is requesting a call back from Dr. Olin Pia nurse to reschedule.

## 2019-05-11 ENCOUNTER — Inpatient Hospital Stay: Payer: Medicare Other

## 2019-05-11 ENCOUNTER — Ambulatory Visit: Payer: Medicare Other | Admitting: Radiation Oncology

## 2019-05-11 NOTE — Telephone Encounter (Signed)
[name] from the [facility] states she is following up in regards to rescheduling MRI. Please return call to discuss.

## 2019-05-14 NOTE — Telephone Encounter (Signed)
Left message for [name] with [facility], per Dr Caryl Comes he will be available for pt's MRI 05/19/2019 or 05/21/2019 at 6pm.  Requested callback with appointment scheduled.

## 2019-05-19 ENCOUNTER — Ambulatory Visit (HOSPITAL_COMMUNITY)
Admission: RE | Admit: 2019-05-19 | Discharge: 2019-05-19 | Disposition: A | Discharge status: Home / Self Care | Payer: Medicare Other | Source: Ambulatory Visit | Attending: Internal Medicine | Admitting: Internal Medicine

## 2019-05-19 ENCOUNTER — Other Ambulatory Visit: Payer: Self-pay

## 2019-05-19 DIAGNOSIS — C7931 Secondary malignant neoplasm of brain: Secondary | ICD-10-CM | POA: Diagnosis not present

## 2019-05-19 DIAGNOSIS — C439 Malignant melanoma of skin, unspecified: Secondary | ICD-10-CM | POA: Diagnosis not present

## 2019-05-19 LAB — CREATININE, SERUM
Creatinine, Ser: 1.1 mg/dL — ABNORMAL HIGH (ref 0.44–1.00)
GFR calc Af Amer: >60 mL/min (ref >60)
GFR calc non Af Amer: 54 mL/min — ABNORMAL LOW (ref >60)

## 2019-05-19 MED ORDER — GADOBENATE DIMEGLUMINE 529 MG/ML IV SOLN
14.0000 mL | Freq: Once | INTRAVENOUS | Status: AC | PRN
  Administered 2019-05-19: 14 mL via INTRAVENOUS

## 2019-05-20 DIAGNOSIS — C7931 Secondary malignant neoplasm of brain: Secondary | ICD-10-CM | POA: Diagnosis not present

## 2019-05-27 ENCOUNTER — Inpatient Hospital Stay: Payer: Medicare Other | Attending: Radiation Oncology

## 2019-05-27 ENCOUNTER — Ambulatory Visit: Payer: Medicare Other

## 2019-06-02 ENCOUNTER — Telehealth: Payer: Self-pay | Admitting: Internal Medicine

## 2019-06-02 NOTE — Chronic Care Management (AMB) (Signed)
  Chronic Care Management   Note  06/02/2019 Name: [name] MRN: [id number] DOB: [birth year]  [name] is a 64 y.o. year old female who is a primary care patient of Panosh, Standley Brooking, MD. I reached out to Liana Gerold by phone today in response to a referral sent by [name]'s PCP, Panosh, Standley Brooking, MD.   [name] was given information about Chronic Care Management services today including:  1. CCM service includes personalized support from designated clinical staff supervised by her physician, including individualized plan of care and coordination with other care providers 2. 24/7 contact phone numbers for assistance for urgent and routine care needs. 3. Service will only be billed when office clinical staff spend 20 minutes or more in a month to coordinate care. 4. Only one practitioner may furnish and bill the service in a calendar month. 5. The patient may stop CCM services at any time (effective at the end of the month) by phone call to the office staff.   Patient agreed to services and verbal consent obtained.   Follow up plan:   [name] [employer] Scheduler

## 2019-06-07 ENCOUNTER — Other Ambulatory Visit: Payer: Self-pay | Admitting: Allergy and Immunology

## 2019-06-15 DIAGNOSIS — Z85820 Personal history of malignant melanoma of skin: Secondary | ICD-10-CM | POA: Diagnosis not present

## 2019-06-15 DIAGNOSIS — M71341 Other bursal cyst, right hand: Secondary | ICD-10-CM | POA: Diagnosis not present

## 2019-06-15 DIAGNOSIS — L308 Other specified dermatitis: Secondary | ICD-10-CM | POA: Diagnosis not present

## 2019-06-15 DIAGNOSIS — D485 Neoplasm of uncertain behavior of skin: Secondary | ICD-10-CM | POA: Diagnosis not present

## 2019-06-15 DIAGNOSIS — Z85828 Personal history of other malignant neoplasm of skin: Secondary | ICD-10-CM | POA: Diagnosis not present

## 2019-06-15 DIAGNOSIS — M71342 Other bursal cyst, left hand: Secondary | ICD-10-CM | POA: Diagnosis not present

## 2019-06-15 DIAGNOSIS — L820 Inflamed seborrheic keratosis: Secondary | ICD-10-CM | POA: Diagnosis not present

## 2019-06-19 ENCOUNTER — Telehealth (INDEPENDENT_AMBULATORY_CARE_PROVIDER_SITE_OTHER): Payer: Medicare Other | Admitting: Internal Medicine

## 2019-06-19 ENCOUNTER — Encounter: Payer: Self-pay | Admitting: Internal Medicine

## 2019-06-19 ENCOUNTER — Other Ambulatory Visit: Payer: Self-pay

## 2019-06-19 VITALS — BP 128/93 | HR 86 | Temp 98.7°F | Ht 63.0 in | Wt 146.0 lb

## 2019-06-19 DIAGNOSIS — L500 Allergic urticaria: Secondary | ICD-10-CM

## 2019-06-19 DIAGNOSIS — T63481A Toxic effect of venom of other arthropod, accidental (unintentional), initial encounter: Secondary | ICD-10-CM

## 2019-06-19 DIAGNOSIS — Z889 Allergy status to unspecified drugs, medicaments and biological substances status: Secondary | ICD-10-CM

## 2019-06-19 DIAGNOSIS — I10 Essential (primary) hypertension: Secondary | ICD-10-CM

## 2019-06-19 DIAGNOSIS — M7989 Other specified soft tissue disorders: Secondary | ICD-10-CM | POA: Diagnosis not present

## 2019-06-19 DIAGNOSIS — J4540 Moderate persistent asthma, uncomplicated: Secondary | ICD-10-CM

## 2019-06-19 MED ORDER — CEPHALEXIN 500 MG PO CAPS: 500.0000 mg | ORAL_CAPSULE | Freq: Two times a day (BID) | ORAL | 0 refills | Status: AC

## 2019-06-19 NOTE — Progress Notes (Signed)
Virtual Visit via Video Note  I connected with@ on 06/19/19 at  2:00 PM EDT by a video enabled telemedicine application and verified that I am speaking with the correct person using two identifiers. Location patient: home Location provider:work office Persons participating in the virtual visit: patient, provider ( husband in pix at times)  [name] national recommendations  regarding COVID 19 pandemic   video visit is advised over in office visit for this patient.  Patient aware  of the limitations of evaluation and management by telemedicine and  availability of in person appointments. and agreed to proceed.   HPI: [name] presents for video visit 2 weeks had sting on foot Ago wasp?   No stinger  Noted  Ice and benadryl ointment and got better and 2 days ago "blew  Up "  D doctor google   Then stinging itching and hot  Area  red and foot  And behind toe.  (Little cyst  Prev eval by [name] felt from      immunotherapy ) Wrapped ice   As tolerated    Motrin helps the swelling  Up and down no fever  No injury   asl about epi pen also  Had rx to mri contrast.  Tends to itch a lot  ROS: See pertinent positives and negatives per HPI.  Past Medical History:  Diagnosis Date  . Angio-edema   . Arthritis   . Asthma   . Atrial fibrillation ([place])   . Atrial tachycardia ([place])   . Cervicalgia   . Complication of anesthesia    pt states wakes up with "shakes"  . CVA (cerebral infarction)    2012 with dizziness and vision change felt embolic from atrial tachy  . Disorder of bone and cartilage, unspecified   . Disturbance of skin sensation   . Diverticulosis   . DM (diabetes mellitus) ([place])   . DVT (deep venous thrombosis) ([place])   . DVT (deep venous thrombosis) ([place])   . Eczema   . Family history of anesthesia complication    PONV  . Fatty liver   . GERD (gastroesophageal reflux disease)   . History of hiatal hernia   . History of pacemaker   . History of radiation therapy  10/24/12   brain  . HLD (hyperlipidemia)   . HTN (hypertension)   . Hypoglycemia, unspecified   . 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 Liver metastasis ([place])   . Lung metastasis ([place])   . Metastasis to brain ([place])   . Osteopenia   . Other nonspecific abnormal serum enzyme levels   . Other specified congenital anomalies of nervous system   . Pacemaker    autonomic dysfunction  . Pancreatitis    from therapy  . Peripheral vascular disease ([place])   . Pneumonia   . PONV (postoperative nausea and vomiting)   . POTS (postural orthostatic tachycardia syndrome)   . Recurrent upper respiratory infection (URI)   . Skin cancer    Hx: of lung lesion  . Stroke [facility])    left weaker   . Swelling of limb   . Urticaria     Past Surgical History:  Procedure Laterality Date  . ABLATION SAPHENOUS VEIN W/ RFA     2002  . ADENOIDECTOMY    . CARPAL TUNNEL RELEASE Left   . CHOLECYSTECTOMY  03/28/2018  . CHOLECYSTECTOMY N/A 03/28/2018   Procedure: LAPAROSCOPIC CHOLECYSTECTOMY WITH INTRAOPERATIVE CHOLANGIOGRAM;  Surgeon:  Johnathan Hausen, MD;  Location: [facility];  Service: General;  Laterality: N/A;  . CRANIOTOMY N/A 09/30/2012   suboccipital craniectomy  . CRANIOTOMY Left 02/09/2013   Procedure: LEFT PARIETAL CRANIOTOMY with stealth;  Surgeon: [name], MD;  Location: [facility];  Service: Neurosurgery;  Laterality: Left;  LEFT Parietal Craniotomy for tumor with stealth  . DEEP AXILLARY SENTINEL NODE BIOPSY / EXCISION     due to extensive Melanoma-right arm  . fatty tumor removed     from chest  . INSERT / REPLACE / REMOVE PACEMAKER  2012   1999, x 3  . LAPAROSCOPY  09/06/2011   Procedure: LAPAROSCOPY OPERATIVE;  Surgeon: Claiborne Billings A. Pamala Hurry, MD;  Location: [facility];  Service: Gynecology;  Laterality: Left;  with Left Ovarian Cystectomy   . MELANOMA EXCISION     with removal of lymph nodes, left shoulder  . NOSE SURGERY  2010, 2012   for nose bleeds x 2  . PACEMAKER  IMPLANT    . SINOSCOPY    . TONSILLECTOMY AND ADENOIDECTOMY      Family History  Problem Relation Age of Onset  . Allergic rhinitis Sister   . Stroke Mother   . Colon polyps Mother   . Colon cancer Mother   . Urticaria Mother   . Allergic rhinitis Mother   . Heart disease Father   . Diabetes Father   . Kidney disease Father   . Diabetes Maternal Grandmother   . Clotting disorder Maternal Grandmother        stroke  . Crohn's disease Maternal Grandmother   . Diabetes Paternal Grandmother   . Aneurysm Sister        brain    Social History   Tobacco Use  . Smoking status: Never Smoker  . Smokeless tobacco: Never Used  Substance Use Topics  . Alcohol use: Not Currently    Comment: glass of wine daily  . Drug use: No      Current Outpatient Medications:  .  acetaminophen (TYLENOL) 500 MG tablet, Take 500 mg by mouth at bedtime. , Disp: , Rfl:  .  albuterol (PROVENTIL) (2.5 MG/3ML) 0.083% nebulizer solution, Take 3 mLs (2.5 mg total) by nebulization every 4 (four) hours as needed for wheezing or shortness of breath., Disp: 180 mL, Rfl: 1 .  albuterol (VENTOLIN HFA) 108 (90 Base) MCG/ACT inhaler, INHALE 2 PUFFS INTO THE LUNGS EVERY 4 (FOUR) HOURS AS NEEDED FOR WHEEZING OR SHORTNESS OF BREATH., Disp: 18 Inhaler, Rfl: 0 .  albuterol (VENTOLIN HFA) 108 (90 Base) MCG/ACT inhaler, Use 1-2 puffs every 4-6 hours if needed for cough or wheeze., Disp: 18 g, Rfl: 5 .  augmented betamethasone dipropionate (DIPROLENE) 0.05 % ointment, Apply topically 2 (two) times daily. Limit to no longer than 2 weeks use. Not on face., Disp: 50 g, Rfl: 0 .  Azelastine HCl 0.15 % SOLN, Use 1-2 sprays in each nostril 2 times daily as needed., Disp: 30 mL, Rfl: 5 .  betamethasone dipropionate 0.05 % cream, APPLY TO BITES TWICE DAILY AS NEEDED, Disp: , Rfl:  .  bimatoprost (LATISSE) 0.03 % ophthalmic solution, APPLY 1 DROP IN BOTH EYES ONCE A DAY, Disp: , Rfl:  .  budesonide-formoterol (SYMBICORT) 160-4.5  MCG/ACT inhaler, Inhale 2 puffs into the lungs 2 (two) times daily. Use with spacer., Disp: 1 Inhaler, Rfl: 5 .  calcium carbonate (TUMS - DOSED IN MG ELEMENTAL CALCIUM) 500 MG chewable tablet, Chew 1 tablet by mouth daily as needed for indigestion  or heartburn. , Disp: , Rfl:  .  cholestyramine (QUESTRAN) 4 g packet, Take by mouth., Disp: , Rfl:  .  clonazePAM (KLONOPIN) 1 MG tablet, Take by mouth., Disp: , Rfl:  .  cloNIDine (CATAPRES) 0.1 MG tablet, Take 1 tablet (0.1 mg total) by mouth at bedtime., Disp: 90 tablet, Rfl: 3 .  cyanocobalamin (,VITAMIN B-12,) 1000 MCG/ML injection, Inject 1 mL (1,000 mcg total) into the muscle every 30 (thirty) days., Disp: 12 mL, Rfl: 0 .  Desoximetasone (TOPICORT) 0.25 % ointment, Apply 1 application topically 2 (two) times daily., Disp: , Rfl:  .  diphenhydrAMINE (BENADRYL) 25 MG tablet, Take 25 mg by mouth at bedtime as needed for allergies or sleep. , Disp: , Rfl:  .  fluorometholone (FML) 0.1 % ophthalmic suspension, Place 1 drop into both eyes 4 (four) times daily as needed (itching). , Disp: , Rfl: 1 .  fluorometholone (FML) 0.1 % ophthalmic suspension, Administer 1 drop to both eyes 4 (four) times a day as needed., Disp: , Rfl:  .  fluticasone (FLONASE) 50 MCG/ACT nasal spray, Place 2 sprays into both nostrils daily., Disp: 16 g, Rfl: 12 .  fluticasone furoate-vilanterol (BREO ELLIPTA) 100-25 MCG/INH AEPB, Inhale 1 puff into the lungs daily. For wheezing suppression, Disp: 1 each, Rfl: 3 .  hydrocortisone (ANUSOL-HC) 25 MG suppository, Place 1 suppository (25 mg total) rectally at bedtime as needed for hemorrhoids. (Patient taking differently: Place 25 mg rectally at bedtime as needed for hemorrhoids. ), Disp: 12 suppository, Rfl: 0 .  hydrocortisone 2.5 % cream, Apply 1 application topically 2 (two) times daily as needed (irritation)., Disp: 30 g, Rfl: 0 .  hyoscyamine (LEVSIN SL) 0.125 MG SL tablet, Place under the tongue., Disp: , Rfl:  .  ibuprofen  (ADVIL,MOTRIN) 200 MG tablet, Take 200-400 mg by mouth 3 (three) times daily as needed for moderate pain. , Disp: , Rfl:  .  levETIRAcetam (KEPPRA) 250 MG tablet, Take 250 mg by mouth 3 (three) times daily. 6 am, 12 pm, 6 pm, Disp: , Rfl:  .  levocetirizine (XYZAL) 5 MG tablet, TAKE 1 TABLET BY MOUTH EVERY DAY AS NEEDED, Disp: 30 tablet, Rfl: 0 .  lipase/protease/amylase (CREON) 12000 units CPEP capsule, Take 12,000 Units by mouth daily with breakfast., Disp: , Rfl:  .  Loteprednol-Tobramycin (ZYLET) 0.5-0.3 % SUSP, Administer 1 drop to the right eye daily as needed., Disp: , Rfl:  .  metoprolol tartrate (LOPRESSOR) 25 MG tablet, TAKE 1 TABLET (25 MG TOTAL) BY MOUTH 2 (TWO) TIMES DAILY., Disp: 180 tablet, Rfl: 3 .  montelukast (SINGULAIR) 10 MG tablet, Take 1 tablet (10 mg total) by mouth at bedtime., Disp: 30 tablet, Rfl: 5 .  nystatin (MYCOSTATIN) 100000 UNIT/ML suspension, Use as directed 5 mLs in the mouth or throat 3 (three) times daily as needed (thrush)., Disp: , Rfl:  .  Olopatadine HCl (PATADAY) 0.2 % SOLN, Use one drop in each eye once daily as needed., Disp: 2.5 mL, Rfl: 5 .  polyethylene glycol (MIRALAX / GLYCOLAX) packet, Take 17 g by mouth at bedtime., Disp: , Rfl:  .  potassium chloride SA (K-DUR,KLOR-CON) 20 MEQ tablet, Take 1 tablet (20 mEq total) by mouth 2 (two) times daily., Disp: 60 tablet, Rfl: 3 .  potassium chloride SA (KLOR-CON) 20 MEQ tablet, Take by mouth., Disp: , Rfl:  .  sodium chloride (OCEAN) 0.65 % SOLN nasal spray, Place 1 spray into both nostrils as needed for congestion. , Disp: , Rfl:  .  Syringe/Needle, Disp, (SYRINGE 3CC/27GX1-1/4") 27G X 1-1/4" 3 ML MISC, Inject 1 mL into the muscle every 30 (thirty) days., Disp: 12 each, Rfl: 0 .  Vitamin D, Ergocalciferol, (DRISDOL) 50000 units CAPS capsule, TAKE 1 CAPSULE (50,000 UNITS TOTAL) BY MOUTH EVERY 7 (SEVEN) DAYS. (Patient taking differently: Take 50,000 Units by mouth See admin instructions. Take 50000 units by mouth  every Mon, Wed, and Sat), Disp: 4 capsule, Rfl: 11 .  cephALEXin (KEFLEX) 500 MG capsule, Take 1 capsule (500 mg total) by mouth 2 (two) times daily for 5 days., Disp: 10 capsule, Rfl: 0 .  cloNIDine (CATAPRES) 0.1 MG tablet, Take by mouth., Disp: , Rfl:   EXAM: BP Readings from Last 3 Encounters:  06/19/19 (!) 128/93  12/08/18 98/68  03/29/18 119/69    VITALS per patient if applicable:  GENERAL: alert, oriented, appears well and in no acute distress HEENT: atraumatic, conjunttiva clear, no obvious abnormalities on inspection of external nose and ears NECK: normal movements of the head and neck LUNGS: on inspection no signs of respiratory distress, breathing rate appears normal, no obvious gross SOB, gasping or wheezing  CV: no obvious cyanosis  MS: moves all visible extremities   See picture on my chart  Send earlier today   PSYCH/NEURO: pleasant and cooperative, no obvious depression or anxiety, speech and thought processing grossly intact   ASSESSMENT AND PLAN:  Discussed the following assessment and plan:    ICD-10-CM   1. Local reaction to insect sting, accidental or unintentional, initial encounter  T63.481A   2. Foot swelling  M79.89   3. Recurrent urticaria  L50.0   4. Multiple allergies  Z88.9    Looks like local swelling   And responding to cold and ibuprofen more likely continued response to hyemoptera sting but at risk for infection     keflex  Sent in to pharmacy to  Hold over week end but if getting swelling and progressive pain ful like bacterial  infection then add antibiotic   Limit and caution with the cold timing to avoid  Cold skin st reaction Advise contact Amanda  Starling Manns about need for epipen    ( systemic rx with stings) but other  contrasted   Some sx after covid vaccine  Counseled.   Expectant management and discussion of plan and treatment with opportunity to ask questions and all were answered. The patient agreed with the plan and demonstrated an  understanding of the instructions.   Advised to call back or seek an in-person evaluation if worsening  or having  further concerns . Return if symptoms worsen or fail to improve as expected. Outside external source  DATA REVIEWED:  Independent historian:  Total time on date  of service including record review ordering and plan of care:  30 minutes    [name], MD

## 2019-06-24 ENCOUNTER — Telehealth: Payer: Medicare Other

## 2019-07-07 ENCOUNTER — Other Ambulatory Visit: Payer: Self-pay | Admitting: Allergy and Immunology

## 2019-07-07 DIAGNOSIS — C439 Malignant melanoma of skin, unspecified: Secondary | ICD-10-CM | POA: Diagnosis not present

## 2019-07-07 DIAGNOSIS — C4359 Malignant melanoma of other part of trunk: Secondary | ICD-10-CM | POA: Diagnosis not present

## 2019-07-07 DIAGNOSIS — R21 Rash and other nonspecific skin eruption: Secondary | ICD-10-CM | POA: Diagnosis not present

## 2019-07-07 DIAGNOSIS — Z6827 Body mass index (BMI) 27.0-27.9, adult: Secondary | ICD-10-CM | POA: Diagnosis not present

## 2019-07-07 DIAGNOSIS — Z9289 Personal history of other medical treatment: Secondary | ICD-10-CM | POA: Diagnosis not present

## 2019-07-07 DIAGNOSIS — J329 Chronic sinusitis, unspecified: Secondary | ICD-10-CM | POA: Diagnosis not present

## 2019-07-07 DIAGNOSIS — C787 Secondary malignant neoplasm of liver and intrahepatic bile duct: Secondary | ICD-10-CM | POA: Diagnosis not present

## 2019-07-07 DIAGNOSIS — C7931 Secondary malignant neoplasm of brain: Secondary | ICD-10-CM | POA: Diagnosis not present

## 2019-07-07 DIAGNOSIS — M8589 Other specified disorders of bone density and structure, multiple sites: Secondary | ICD-10-CM | POA: Diagnosis not present

## 2019-07-21 NOTE — Chronic Care Management (AMB) (Signed)
Chronic Care Management Pharmacy  Name: ANELIS HRIVNAK  MRN: [id number] DOB: Jan 18, 1956  Initial Questions: 1. Have you seen any other providers since your last visit? NA 2. Any changes in your medicines or health? No   Chief Complaint/ HPI  AMRITHA YORKE,  64 y.o. , female presents for their Initial CCM visit with the clinical pharmacist via telephone due to COVID-19 Pandemic.  PCP : Burnis Medin, MD  Patient stated she underwent immunotherapy 5 years ago and since then she has been managing long term effects as well as long term use of dexamethasone and prednisone.  Patient states she unable to maintain full time job due to health. But volunteers to help others with cancer and coordinating their care.   Their chronic conditions include: Asthma, HTN, Diverticulitis, Osteopenia, Heartburn, Chronic colitis, Atopic dermatitis, Anxiety, Seizures, Allergic rhinitis, urticaria, Thrush, Pain, vitamin B12 deficiency  Office Visits: 06/19/2019- [name], MD- Patient presented for virtual visit for insect bite. Patient stated she had a sting from 2 weeks ago. Patient using cold and ibuprofen and then then 2 days ago, "it blew up". Patient to continue with cold and ibuprofen. Patient prescribed Keflex due to risk for infection. Patient instructed to hold over the weekend and then add if getting swelling and pain. Patient to follow up if symptoms worsen or fail to improve.   02/24/2019- [name], MD- patient presented for virtual visit for headaches, nausea, and malaise. Patient was advised to obtain COVID test. COVID- test result is pending.For eczema, patient offered to try level 1 potency steroid for 2 weeks (not on face). Patient to obtain further evaluation on treatment for eczema through dermatologist.   Consult Visit: 07/07/2019- Surgical Oncology- Briscoe Burns Collichio- Patient presented for follow up. Patient continues close follow up with dermatologist for melanoma of skin.  Recent MRI of brain- patient remains in remission for brain metastases. Scans reviewed for metastatic melanoma of liver and patient remains in remission. Surveillance every 6 months. Patient reported painful and extensive rash (macular and blistering) on her back, arms, and legs. Improving on steroid creams. Patient has osteonecrosis of femoral head and has been on intermittent steroids. Patient to obtain a bone density.   03/31/2019- Otolaryngology- Izora Gala, MD- Patient presented for virtual visit for chronic nosebleeds. Recommended patient to increase saline spray up to 20-30 times daily. Recommended to use Afrin on cottonball or tampon to stop active bleeding. Patient to continue using humidifier. Patient to follow-up as needed.   02/11/2019- Gastroenterology- Sylvan Cheese, MD- Patient presented for office visit for follow up consultation in request of Dr. Caryl Comes for microscopic colitis. Patient was given information for [employer] (budenoside) and copay assistance card. Patient to stop Questran and increase fiber supplement in smoothies. Patient to continue Miralax daily and use Levsin as needed. Colonscopy due in 2018. Patient to follow up in 6 months or sooner as needed.  01/06/2019- Surgical Oncology- Patient presented for office visit for melanoma of skin follow up. Patient to continue with dermatology follow up for lesion on chest wall. Patient to obtain MRI in December.  Patient has been in remission for 5.5 years for metastatic melanoma to liver. Patient to continue budesonide for IBS.   12/08/2018- Allergy and Immunology- Adelina Mings, MD- Patient presented for office visit for asthma, cough, urticaria, and rash. Patient prescribed Symbicort 160/4.63mcg, montelukast 10mg  daily, and albuterol 0.083% neb solution. Patient to continue albuterol HFA as needed. Recommended nasal saline spray or lavage. Prescription for azelastine nasal spray  and levocetirizine 5mg  was given; as well as for  Pataday, 1 drop in each eye as needed. Patient to try lubricant drops as needed. For epistaxis, patient recommended to use Afrin and apply on a cotton ball to stanch the blood flow.   Medications: Outpatient Encounter Medications as of 07/22/2019  Medication Sig Note  . acetaminophen (TYLENOL) 500 MG tablet Take 500 mg by mouth at bedtime.  03/27/2018: Scheduled   . albuterol (PROVENTIL) (2.5 MG/3ML) 0.083% nebulizer solution Take 3 mLs (2.5 mg total) by nebulization every 4 (four) hours as needed for wheezing or shortness of breath.   Marland Kitchen albuterol (VENTOLIN HFA) 108 (90 Base) MCG/ACT inhaler INHALE 2 PUFFS INTO THE LUNGS EVERY 4 (FOUR) HOURS AS NEEDED FOR WHEEZING OR SHORTNESS OF BREATH.   Marland Kitchen augmented betamethasone dipropionate (DIPROLENE) 0.05 % ointment Apply topically 2 (two) times daily. Limit to no longer than 2 weeks use. Not on face.   . bimatoprost (LATISSE) 0.03 % ophthalmic solution APPLY 1 DROP IN BOTH EYES ONCE A DAY   . budesonide-formoterol (SYMBICORT) 160-4.5 MCG/ACT inhaler Inhale 2 puffs into the lungs 2 (two) times daily. Use with spacer.   . calcium carbonate (TUMS - DOSED IN MG ELEMENTAL CALCIUM) 500 MG chewable tablet Chew 1 tablet by mouth daily as needed for indigestion or heartburn.    Marland Kitchen CALCIUM PO Take 1 tablet by mouth daily.   . clonazePAM (KLONOPIN) 1 MG tablet Take 1 mg by mouth 2 (two) times daily.    . cloNIDine (CATAPRES) 0.1 MG tablet Take 1 tablet (0.1 mg total) by mouth at bedtime.   . cyanocobalamin (,VITAMIN B-12,) 1000 MCG/ML injection Inject 1 mL (1,000 mcg total) into the muscle every 30 (thirty) days.   . Desoximetasone (TOPICORT) 0.25 % ointment Apply 1 application topically 2 (two) times daily.   . diphenhydrAMINE (BENADRYL) 25 MG tablet Take 25 mg by mouth at bedtime as needed for allergies or sleep.    . fluorometholone (FML) 0.1 % ophthalmic suspension Place 1 drop into both eyes 4 (four) times daily as needed (itching).    . fluticasone (FLONASE) 50  MCG/ACT nasal spray Place 2 sprays into both nostrils daily. (Patient taking differently: Place 2 sprays into both nostrils daily. )   . hydrocortisone (ANUSOL-HC) 25 MG suppository Place 1 suppository (25 mg total) rectally at bedtime as needed for hemorrhoids. (Patient taking differently: Place 25 mg rectally at bedtime as needed for hemorrhoids. )   . ibuprofen (ADVIL,MOTRIN) 200 MG tablet Take 200-400 mg by mouth 3 (three) times daily as needed for moderate pain.    Marland Kitchen levETIRAcetam (KEPPRA) 250 MG tablet Take 250 mg by mouth 3 (three) times daily. 6 am, 12 pm, 6 pm   . levocetirizine (XYZAL) 5 MG tablet TAKE 1 TABLET BY MOUTH EVERY DAY AS NEEDED   . lipase/protease/amylase (CREON) 12000 units CPEP capsule Take 12,000 Units by mouth daily with breakfast. 03/27/2018: Takes only with breakfast  . Loteprednol-Tobramycin (ZYLET) 0.5-0.3 % SUSP Administer 1 drop to the right eye daily as needed.   . metoprolol tartrate (LOPRESSOR) 25 MG tablet TAKE 1 TABLET (25 MG TOTAL) BY MOUTH 2 (TWO) TIMES DAILY.   Marland Kitchen nystatin (MYCOSTATIN) 100000 UNIT/ML suspension Use as directed 5 mLs in the mouth or throat 3 (three) times daily as needed (thrush).   . Olopatadine HCl (PATADAY) 0.2 % SOLN Use one drop in each eye once daily as needed.   . polyethylene glycol (MIRALAX / GLYCOLAX) packet Take 17 g by  mouth at bedtime.   . potassium chloride SA (K-DUR,KLOR-CON) 20 MEQ tablet Take 1 tablet (20 mEq total) by mouth 2 (two) times daily.   . sodium chloride (OCEAN) 0.65 % SOLN nasal spray Place 1 spray into both nostrils as needed for congestion.    . Syringe/Needle, Disp, (SYRINGE 3CC/27GX1-1/4") 27G X 1-1/4" 3 ML MISC Inject 1 mL into the muscle every 30 (thirty) days.   . Vitamin D, Ergocalciferol, (DRISDOL) 50000 units CAPS capsule TAKE 1 CAPSULE (50,000 UNITS TOTAL) BY MOUTH EVERY 7 (SEVEN) DAYS. (Patient taking differently: Take 50,000 Units by mouth See admin instructions. Take 50000 units by mouth every Mon, Wed, and  Sat)   . albuterol (VENTOLIN HFA) 108 (90 Base) MCG/ACT inhaler Use 1-2 puffs every 4-6 hours if needed for cough or wheeze. (Patient not taking: Reported on 07/22/2019)   . Azelastine HCl 0.15 % SOLN Use 1-2 sprays in each nostril 2 times daily as needed. (Patient not taking: Reported on 07/22/2019)   . betamethasone dipropionate 0.05 % cream Apply topically as needed.    . cholestyramine (QUESTRAN) 4 g packet Take by mouth.   . cloNIDine (CATAPRES) 0.1 MG tablet Take by mouth.   . fluorometholone (FML) 0.1 % ophthalmic suspension Administer 1 drop to both eyes 4 (four) times a day as needed.   . fluticasone furoate-vilanterol (BREO ELLIPTA) 100-25 MCG/INH AEPB Inhale 1 puff into the lungs daily. For wheezing suppression (Patient not taking: Reported on 07/22/2019)   . hydrocortisone 2.5 % cream Apply 1 application topically 2 (two) times daily as needed (irritation). (Patient not taking: Reported on 07/22/2019)   . hyoscyamine (LEVSIN SL) 0.125 MG SL tablet Place under the tongue.   . montelukast (SINGULAIR) 10 MG tablet Take 1 tablet (10 mg total) by mouth at bedtime. (Patient not taking: Reported on 07/22/2019)   . potassium chloride SA (KLOR-CON) 20 MEQ tablet Take by mouth.    No facility-administered encounter medications on file as of 07/22/2019.     Current Diagnosis/Assessment:  Goals Addressed            This Visit's Progress   . Pharmacy Care Plan       CARE PLAN ENTRY  Current Barriers:  . Chronic Disease Management support, education, and care coordination needs related to Hypertension, Asthma, and allergic rhinitis    Hypertension . Pharmacist Clinical Goal(s): o Over the next 180 days, patient will work with PharmD and providers to maintain BP goal <140/90 . Current regimen:   Clonidine 0.1mg , 1 tablet at bedtime  Metoprolol tartrate 25mg , 1 tablet twice daily . Interventions: o We discussed taking precaution standing up and taking time to take first step  . Patient  self care activities - Over the next 180 days, patient will: o Check BP daily , document, and provide at future appointments  Asthma . Pharmacist Clinical Goal(s) o Over the next 180 days, patient will work with PharmD and providers to maintain breathing controlled.  . Current regimen:   Albuterol 0.083% nebulizer solution, 3 MLs every four hours as needed for wheezing or shortness of breath  Albuterol HFA 153mcg/act inhaler, 2 puffs every hours as needed for wheezing or shortness of breath  Budesonide/ formorterol (Symbicort) 160-4.33mcg/ act inhaler, 2 puffs twice daily  Fluticasone/ vilanterol (Breo Ellipta), 1 puff once daily  . Interventions: o We discussed duplication: duplication with budesonide/ formorterol (Symbicort) and fluticasone/ vlanterol (Breo Ellipta) . Patient self care activities o Patient will continue current medications as prescribed.  Allergic rhinitis  . Pharmacist Clinical Goal(s) o Over the next 180 days, patient will work with PharmD and providers to minimize allergy and rash symptoms . Current regimen:   levocetirizine (Xyzal) 5mg , 1 tablet everday as needed  loratadine (Claritin), 1 tablet once daily as needed  . Interventions: o Discussed duplication in antihistamines. Discussed avoiding using together and  . Patient self care activities - patient will: o Continue current medications as directed by providers.   Medication management . Pharmacist Clinical Goal(s): o Over the next 180 days, patient will work with PharmD and providers to maintain optimal medication adherence . Current [facility]  . Interventions o Comprehensive medication review performed. o Continue current medication management strategy o Consulting with Dr. Regis Bill on obtaining vitamin B12, lipid panel, and inflammatory markers.  . Patient self care activities - Over the next 180 days, patient will: o Take medications as prescribed o Report any questions or concerns to PharmD  and/or provider(s)  Initial goal documentation       SDOH Interventions     Most Recent Value  SDOH Interventions  Transportation Interventions  Intervention Not Indicated       Moderate persistent Asthma    Eosinophil count:   Lab Results  Component Value Date/Time   EOSPCT 1.9 05/27/2015 09:24 AM  %                               Eos (Absolute):  Lab Results  Component Value Date/Time   EOSABS 0.1 05/27/2015 09:24 AM   Tobacco Status:  Social History   Tobacco Use  Smoking Status Never Smoker  Smokeless Tobacco Never Used   Patient has failed these meds in past: none   Patient is currently controlled on the following medications:   Albuterol 0.083% nebulizer solution, 3 MLs every four hours as needed for wheezing or shortness of breath  Albuterol HFA 133mcg/act inhaler, 2 puffs every hours as needed for wheezing or shortness of breath  Budesonide/ formorterol (Symbicort) 160-4.31mcg/ act inhaler, 2 puffs twice daily  Fluticasone/ vilanterol (Breo Ellipta), 1 puff once daily   Using maintenance inhaler regularly? No- states only using as needed (3x per month). Depending on weather. When humid, will tend to use more often.    Frequency of rescue inhaler use:  prn- patient states only when she is outside  Nebulizer- has only used about 3x since last prescribed   We discussed:  proper inhaler technique - duplication with budesonide/ formorterol (Symbicort) and fluticasone/ vlanterol (Breo Ellipta): patient states does not use together and has not needed to use Breo Ellipta in over a year. Will use only if her breathing gets "bad"    Plan Continue current medications  Hypertension  Endorses dizziness/ lightheadedness and main reason why she has pacemaker. She stated she can't move quickly especially heat sensitive. Has been experiencing since 1998, but currently reports being stable  And interested changing medications.   Office blood pressures are  BP Readings  from Last 3 Encounters:  06/19/19 (!) 128/93  12/08/18 98/68  03/29/18 119/69   Patient has failed these meds in the past: none   Patient checks BP at home daily  Patient home BP readings are ranging: 130/90 (if in pain); but other times will be lower.   Patient is controlled on:   Clonidine 0.1mg , 1 tablet at bedtime  Metoprolol tartrate 25mg , 1 tablet twice daily   We discussed taking precaution standing  up and taking time to take first step   Plan Continue current medications   Diverticulitis    Patient is currently controlled on the following medications:   Miralax, 17 g at bedtime  Plan Continue current medications  Osteopenia   Patient states she has always low on vitamin D.   Last DEXA Scan: patient in process of obtaining bone density scan.    Vit D, 25-Hydroxy  Date Value Ref Range Status  10/24/2009 24 (L) 30 - 89 ng/mL Final    Comment:    See lab report for associated comment(s)    [facility] Vitamin D: 38.1 (01/06/2019)  Patient is currently on the following medications:   Vitamin D (ergocalciferl) 50,000 units, 1 capsule every Monday, Wednesday, and Saturday  Calcium, 1 tablet once daily    Plan Patient to follow up for bone density scan  Continue current medications  Heartburn   Patient has failed these meds in past: esomeprazole   Patient is currently controlled on the following medications:   Calcium carbonate (Tums) 500mg , chew 1 tablet daily as needed for indigestion or heartburn   Plan Continue current medications  Chronic colitis    Patient has failed these meds in past: none   Patient is currently controlled on the following medications:   Lipase/ protease/ amylase (Creon) 12,000 units, 1 capsule once daily with breakfast   Plan Continue current medications  Atopic dermatitis  Patient told to use sparingly to decrease skin thinning.  Patient reports needing to take diphenhydramine consistently to keep rash  under control.   Patient is currently controlled on the following medications:   Betamethasone dipropionate 0.05% ointment, apply two times daily. Limit to no longer than 2 weeks use. Not on face.  Desoximetasone 5.85% ointment, 1 application twice daily  Diphenhydramine (Benadryl) 25mg , 1 tablet at bedtime as needed   Plan Managed by Dr. Ronnald Ramp (dermatologist) Continue current medications  Anxiety   Patient is currently controlled on the following medications:   Clonazepam 1mg , 1 tablet twice daily   Plan Continue current medications  Seizures   Patient states she was told by neurosurgeon she will never get off of it due to history of brain cancer (had 2 brain surgery).   Patient is currently controlled on the following medications:   levetiracetam 250mg , 1 tablet three times daily  Plan Continue current medications  Allergic rhinitis/ urticaria    Patient has failed these meds in the past: Montelukast (patient reported lethargy)   Patient is currently controlled on the following medications:   Fluticasone (Flonase) 68mcg/act nasal spray, 2 sprays into both nostrils daily  levocetirizine (Xyzal) 5mg , 1 tablet everday as needed  loratadine (Claritin), 1 tablet once daily as needed   Sodium chloride 0.65% nasal spray solution, 1 spray into both nostrils as needed for congesion (uses 3 to 4 x/ day)   Olopatadine (Pataday) 0.2% solution, 1 drop in each eye once daily as needed  We discussed:   - therapy duplication: levocetirizine, loratadine, diphenhydramine: patient stated she does not use antihistamines at same time. Will use diphenhydramine to calm down rash.   Plan Continue current medications  Thrush   Patient reported after immunotherapy and cancer therapy, stress causes thrush breakout.   Patient is currently controlled on the following medications:  - nystatin 100000 unit/ ml, 5 MLs three times daily as needed thrush   Plan Continue current  medications  Pain    Patient is currently controlled on the following medications:  Ibuprofen 200mg , 200-400mg  three times daily as needed for moderate pain  Acetaminophen 500mg , 1 tablet at bedtime   Plan Continue current medications  Vitamin B12 deficiency   Reports being deficient in the past.  Unclear who is monitoring blood levels.  Last vitamin B12 in chart 2011; last level   Vitamin B12: 522 (10/24/2009)   Patient is currently on the following medications:   Cyanocobalamin 1073mcg/ml injection, inject 1 ML every 30 days   We discussed:   - vitamin B12 monitoring.   Plan Recommend vitamin B12 level.  Continue current medications and reassess at lab results  Potassium supplementation   Potassium: 5.0 (07/07/2019)   Patient is currently  on the following medications:   Potassium chloride 28mEq, 1 tablet twice daily  Plan Continue current medications  Hemorrhoids    Patient is currently on the following medications:  Hydrocortisone 25mg  suppository, place 1 rectally at bedtime as needed for hemorrhoids  Plan Continue current medications   Vaccines   Reviewed and discussed patient's vaccination history.    Immunization History  Administered Date(s) Administered  . Influenza Split 03/03/2012  . PFIZER SARS-COV-2 Vaccination 05/01/2019  . Pneumococcal Polysaccharide-23 10/03/2012   Patient reacted to 1st dose of COVID vaccine and states she was told to hold off from 2nd dose of COVID.     Medication Management  Patient organizes medications: uses pill box and has reminders for phone  [facility]      Follow up Follow up visit with PharmD in October   Consulted with Dr. Regis Bill and requested labs for vitamin B12 level, lipid panel.  Anson Crofts, PharmD Clinical Pharmacist [facility] [place] [phone]

## 2019-07-22 ENCOUNTER — Telehealth: Payer: Medicare Other

## 2019-07-22 ENCOUNTER — Ambulatory Visit: Payer: Medicare Other

## 2019-07-22 ENCOUNTER — Other Ambulatory Visit: Payer: Self-pay

## 2019-07-22 DIAGNOSIS — J3089 Other allergic rhinitis: Secondary | ICD-10-CM

## 2019-07-22 DIAGNOSIS — J45909 Unspecified asthma, uncomplicated: Secondary | ICD-10-CM

## 2019-07-22 DIAGNOSIS — I10 Essential (primary) hypertension: Secondary | ICD-10-CM

## 2019-07-23 NOTE — Patient Instructions (Addendum)
Visit Information  Goals Addressed            This Visit's Progress   . Pharmacy Care Plan       CARE PLAN ENTRY  Current Barriers:  . Chronic Disease Management support, education, and care coordination needs related to Hypertension, Asthma, and allergic rhinitis    Hypertension . Pharmacist Clinical Goal(s): o Over the next 180 days, patient will work with PharmD and providers to maintain BP goal <140/90 . Current regimen:   Clonidine 0.1mg , 1 tablet at bedtime  Metoprolol tartrate 25mg , 1 tablet twice daily . Interventions: o We discussed taking precaution standing up and taking time to take first step  . Patient self care activities - Over the next 180 days, patient will: o Check BP daily , document, and provide at future appointments  Asthma . Pharmacist Clinical Goal(s) o Over the next 180 days, patient will work with PharmD and providers to maintain breathing controlled.  . Current regimen:   Albuterol 0.083% nebulizer solution, 3 MLs every four hours as needed for wheezing or shortness of breath  Albuterol HFA 127mcg/act inhaler, 2 puffs every hours as needed for wheezing or shortness of breath  Budesonide/ formorterol (Symbicort) 160-4.48mcg/ act inhaler, 2 puffs twice daily  Fluticasone/ vilanterol (Breo Ellipta), 1 puff once daily  . Interventions: o We discussed duplication: duplication with budesonide/ formorterol (Symbicort) and fluticasone/ vlanterol (Breo Ellipta) . Patient self care activities o Patient will continue current medications as prescribed.   Allergic rhinitis  . Pharmacist Clinical Goal(s) o Over the next 180 days, patient will work with PharmD and providers to minimize allergy and rash symptoms . Current regimen:   levocetirizine (Xyzal) 5mg , 1 tablet everday as needed  loratadine (Claritin), 1 tablet once daily as needed  . Interventions: o Discussed duplication in antihistamines. Discussed avoiding using together and  . Patient self  care activities - patient will: o Continue current medications as directed by providers.   Medication management . Pharmacist Clinical Goal(s): o Over the next 180 days, patient will work with PharmD and providers to maintain optimal medication adherence . [facility]  . Interventions o Comprehensive medication review performed. o Continue current medication management strategy o Consulting with Dr. Regis Bill on obtaining vitamin B12, lipid panel, and inflammatory markers.  . Patient self care activities - Over the next 180 days, patient will: o Take medications as prescribed o Report any questions or concerns to PharmD and/or provider(s)  Initial goal documentation        Ms. Boran was given information about Chronic Care Management services today including:  1. CCM service includes personalized support from designated clinical staff supervised by her physician, including individualized plan of care and coordination with other care providers 2. 24/7 contact phone numbers for assistance for urgent and routine care needs. 3. Standard insurance, coinsurance, copays and deductibles apply for chronic care management only during months in which we provide at least 20 minutes of these services. Most insurances cover these services at 100%, however patients may be responsible for any copay, coinsurance and/or deductible if applicable. This service may help you avoid the need for more expensive face-to-face services. 4. Only one practitioner may furnish and bill the service in a calendar month. 5. The patient may stop CCM services at any time (effective at the end of the month) by phone call to the office staff.  Patient agreed to services and verbal consent obtained.   The patient verbalized understanding of instructions provided today  and agreed to receive a mailed copy of patient instruction and/or educational materials. Telephone follow up appointment with pharmacy team member  scheduled for: 12/25/2019  Anson Crofts, PharmD Clinical Pharmacist [facility] [place] [phone]

## 2019-07-31 DIAGNOSIS — M8589 Other specified disorders of bone density and structure, multiple sites: Secondary | ICD-10-CM | POA: Diagnosis not present

## 2019-07-31 DIAGNOSIS — M85852 Other specified disorders of bone density and structure, left thigh: Secondary | ICD-10-CM | POA: Diagnosis not present

## 2019-07-31 DIAGNOSIS — M8588 Other specified disorders of bone density and structure, other site: Secondary | ICD-10-CM | POA: Diagnosis not present

## 2019-07-31 DIAGNOSIS — Z780 Asymptomatic menopausal state: Secondary | ICD-10-CM | POA: Diagnosis not present

## 2019-09-02 ENCOUNTER — Other Ambulatory Visit: Payer: Self-pay

## 2019-09-02 NOTE — Telephone Encounter (Signed)
Pt calling requesting as refill on cyanocobalamin (Vitamin B-12). Pt states that Dr. Caryl Comes prescribed this medication. Would Dr. Caryl Comes like to refill this medication? Pt would like a call back concerning this mater. Please address

## 2019-09-03 MED ORDER — CYANOCOBALAMIN 1000 MCG/ML IJ SOLN: 1000.0000 ug | INTRAMUSCULAR | 0 refills | Status: DC

## 2019-09-04 NOTE — Progress Notes (Signed)
Chief Complaint  Patient presents with  . Medication Management    Doing well    HPI: [name] 64 y.o. come in for medical managements Has hx of very low b12 level and has been getting b12 injec monthly  Ordered by cards  And now would be transferred to PCP last level over 1000 in 2019 [facility] Vit d had been very low in past  And on hd Vit d weekly  Last check [facility] labs and low   About a year agi Rash itchy seems to have flared after covid vaccine has seen  Derm used topical for a few days minld help  . Oncologist said ok to low level pred if needed .  Says the topicort ointment helped some but never posed more than a few days   Had allergic rx to one type o contrast   When had mri. ROS: See pertinent positives and negatives per HPI.  Past Medical History:  Diagnosis Date  . Angio-edema   . Arthritis   . Asthma   . Atrial fibrillation ([place])   . Atrial tachycardia ([place])   . Cervicalgia   . Complication of anesthesia    pt states wakes up with "shakes"  . CVA (cerebral infarction)    2012 with dizziness and vision change felt embolic from atrial tachy  . Disorder of bone and cartilage, unspecified   . Disturbance of skin sensation   . Diverticulosis   . DM (diabetes mellitus) ([place])   . DVT (deep venous thrombosis) ([place])   . DVT (deep venous thrombosis) ([place])   . Eczema   . Family history of anesthesia complication    PONV  . Fatty liver   . GERD (gastroesophageal reflux disease)   . History of hiatal hernia   . History of pacemaker   . History of radiation therapy 10/24/12   brain  . HLD (hyperlipidemia)   . HTN (hypertension)   . Hypoglycemia, unspecified   . 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 Liver metastasis ([place])   . Lung metastasis ([place])   . Metastasis to brain ([place])   . Osteopenia   . Other nonspecific abnormal serum enzyme levels   . Other specified congenital anomalies of nervous system   . Pacemaker    autonomic  dysfunction  . Pancreatitis    from therapy  . Peripheral vascular disease ([place])   . Pneumonia   . PONV (postoperative nausea and vomiting)   . POTS (postural orthostatic tachycardia syndrome)   . Recurrent upper respiratory infection (URI)   . Skin cancer    Hx: of lung lesion  . Stroke [facility])    left weaker   . Swelling of limb   . Urticaria     Family History  Problem Relation Age of Onset  . Allergic rhinitis Sister   . Stroke Mother   . Colon polyps Mother   . Colon cancer Mother   . Urticaria Mother   . Allergic rhinitis Mother   . Heart disease Father   . Diabetes Father   . Kidney disease Father   . Diabetes Maternal Grandmother   . Clotting disorder Maternal Grandmother        stroke  . Crohn's disease Maternal Grandmother   . Diabetes Paternal Grandmother   . Aneurysm Sister        brain    Social History   Socioeconomic History  . Marital status: Married  Spouse name: Not on file  . Number of children: 0  . Years of education: Not on file  . Highest education level: Not on file  Occupational History  . Occupation: PRESIDENT    Employer: [employer]    Comment: self employed  Tobacco Use  . Smoking status: Never Smoker  . Smokeless tobacco: Never Used  Vaping Use  . Vaping Use: Never used  Substance and Sexual Activity  . Alcohol use: Not Currently    Comment: glass of wine daily  . Drug use: No  . Sexual activity: Not on file  Other Topics Concern  . Not on file  Social History Narrative   4 years college hh of 2    Neg tad   Social Determinants of Health   Financial Resource Strain:   . Difficulty of Paying Living Expenses:   Food Insecurity:   . Worried About Charity fundraiser in the Last Year:   . Arboriculturist in the Last Year:   Transportation Needs: No Transportation Needs  . Lack of Transportation (Medical): No  . Lack of Transportation (Non-Medical): No  Physical Activity:   . Days of Exercise per Week:   .  Minutes of Exercise per Session:   Stress:   . Feeling of Stress :   Social Connections:   . Frequency of Communication with Friends and Family:   . Frequency of Social Gatherings with Friends and Family:   . Attends Religious Services:   . Active Member of Clubs or Organizations:   . Attends Archivist Meetings:   Marland Kitchen Marital Status:     Outpatient Medications Prior to Visit  Medication Sig Dispense Refill  . acetaminophen (TYLENOL) 500 MG tablet Take 500 mg by mouth at bedtime.     Marland Kitchen albuterol (PROVENTIL) (2.5 MG/3ML) 0.083% nebulizer solution Take 3 mLs (2.5 mg total) by nebulization every 4 (four) hours as needed for wheezing or shortness of breath. 180 mL 1  . albuterol (VENTOLIN HFA) 108 (90 Base) MCG/ACT inhaler INHALE 2 PUFFS INTO THE LUNGS EVERY 4 (FOUR) HOURS AS NEEDED FOR WHEEZING OR SHORTNESS OF BREATH. 18 Inhaler 0  . augmented betamethasone dipropionate (DIPROLENE) 0.05 % ointment Apply topically 2 (two) times daily. Limit to no longer than 2 weeks use. Not on face. 50 g 0  . Azelastine HCl 0.15 % SOLN Use 1-2 sprays in each nostril 2 times daily as needed. 30 mL 5  . bimatoprost (LATISSE) 0.03 % ophthalmic solution APPLY 1 DROP IN BOTH EYES ONCE A DAY    . budesonide-formoterol (SYMBICORT) 160-4.5 MCG/ACT inhaler Inhale 2 puffs into the lungs 2 (two) times daily. Use with spacer. 1 Inhaler 5  . calcium carbonate (TUMS - DOSED IN MG ELEMENTAL CALCIUM) 500 MG chewable tablet Chew 1 tablet by mouth daily as needed for indigestion or heartburn.     Marland Kitchen CALCIUM PO Take 1 tablet by mouth daily.    . cholestyramine (QUESTRAN) 4 g packet Take by mouth.    . clonazePAM (KLONOPIN) 1 MG tablet Take 1 mg by mouth 2 (two) times daily.     . cloNIDine (CATAPRES) 0.1 MG tablet Take 1 tablet (0.1 mg total) by mouth at bedtime. 90 tablet 3  . cyanocobalamin (,VITAMIN B-12,) 1000 MCG/ML injection Inject 1 mL (1,000 mcg total) into the muscle every 30 (thirty) days. 12 mL 0  .  diphenhydrAMINE (BENADRYL) 25 MG tablet Take 25 mg by mouth at bedtime as needed  for allergies or sleep.     . fluorometholone (FML) 0.1 % ophthalmic suspension Place 1 drop into both eyes 4 (four) times daily as needed (itching).   1  . fluticasone (FLONASE) 50 MCG/ACT nasal spray Place 2 sprays into both nostrils daily. (Patient taking differently: Place 2 sprays into both nostrils as needed. ) 16 g 12  . fluticasone furoate-vilanterol (BREO ELLIPTA) 100-25 MCG/INH AEPB Inhale 1 puff into the lungs daily. For wheezing suppression 1 each 3  . hydrocortisone (ANUSOL-HC) 25 MG suppository Place 1 suppository (25 mg total) rectally at bedtime as needed for hemorrhoids. (Patient taking differently: Place 25 mg rectally at bedtime as needed for hemorrhoids. ) 12 suppository 0  . hydrocortisone 2.5 % cream Apply 1 application topically 2 (two) times daily as needed (irritation). 30 g 0  . hyoscyamine (LEVSIN SL) 0.125 MG SL tablet Place under the tongue.    Marland Kitchen ibuprofen (ADVIL,MOTRIN) 200 MG tablet Take 200-400 mg by mouth 3 (three) times daily as needed for moderate pain.     Marland Kitchen levETIRAcetam (KEPPRA) 250 MG tablet Take 250 mg by mouth 3 (three) times daily. 6 am, 12 pm, 6 pm    . levocetirizine (XYZAL) 5 MG tablet TAKE 1 TABLET BY MOUTH EVERY DAY AS NEEDED 30 tablet 0  . lipase/protease/amylase (CREON) 12000 units CPEP capsule Take 12,000 Units by mouth daily with breakfast.    . loratadine (CLARITIN) 10 MG tablet Take 10 mg by mouth daily.    . metoprolol tartrate (LOPRESSOR) 25 MG tablet TAKE 1 TABLET (25 MG TOTAL) BY MOUTH 2 (TWO) TIMES DAILY. 180 tablet 3  . nystatin (MYCOSTATIN) 100000 UNIT/ML suspension Use as directed 5 mLs in the mouth or throat 3 (three) times daily as needed (thrush).    . Olopatadine HCl (PATADAY) 0.2 % SOLN Use one drop in each eye once daily as needed. 2.5 mL 5  . polyethylene glycol (MIRALAX / GLYCOLAX) packet Take 17 g by mouth at bedtime.    . potassium chloride SA  (K-DUR,KLOR-CON) 20 MEQ tablet Take 1 tablet (20 mEq total) by mouth 2 (two) times daily. 60 tablet 3  . prednisoLONE acetate (PRED FORTE) 1 % ophthalmic suspension SMARTSIG:In Eye(s)    . sodium chloride (OCEAN) 0.65 % SOLN nasal spray Place 1 spray into both nostrils as needed for congestion.     . Syringe/Needle, Disp, (SYRINGE 3CC/27GX1-1/4") 27G X 1-1/4" 3 ML MISC Inject 1 mL into the muscle every 30 (thirty) days. 12 each 0  . tacrolimus (PROTOPIC) 0.1 % ointment Apply 1 application topically 2 (two) times daily.    . Vitamin D, Ergocalciferol, (DRISDOL) 50000 units CAPS capsule TAKE 1 CAPSULE (50,000 UNITS TOTAL) BY MOUTH EVERY 7 (SEVEN) DAYS. (Patient taking differently: Take 50,000 Units by mouth See admin instructions. Take 50000 units by mouth every Mon, Wed, and Sat) 4 capsule 11  . Desoximetasone (TOPICORT) 0.25 % ointment Apply 1 application topically 2 (two) times daily.    Marland Kitchen albuterol (VENTOLIN HFA) 108 (90 Base) MCG/ACT inhaler Use 1-2 puffs every 4-6 hours if needed for cough or wheeze. (Patient not taking: Reported on 07/22/2019) 18 g 5  . betamethasone dipropionate 0.05 % cream Apply topically as needed.  (Patient not taking: Reported on 09/07/2019)    . calcium carbonate (OS-CAL) 1250 (500 Ca) MG chewable tablet Chew by mouth. (Patient not taking: Reported on 09/07/2019)    . cloNIDine (CATAPRES) 0.1 MG tablet Take by mouth.    . diphenhydrAMINE (SOMINEX) 25 MG  tablet Take by mouth. (Patient not taking: Reported on 09/07/2019)    . fluorometholone (FML) 0.1 % ophthalmic suspension Administer 1 drop to both eyes 4 (four) times a day as needed. (Patient not taking: Reported on 09/07/2019)    . Loteprednol-Tobramycin (ZYLET) 0.5-0.3 % SUSP Administer 1 drop to the right eye daily as needed. (Patient not taking: Reported on 09/07/2019)    . montelukast (SINGULAIR) 10 MG tablet Take 1 tablet (10 mg total) by mouth at bedtime. (Patient not taking: Reported on 09/07/2019) 30 tablet 5  .  potassium chloride SA (KLOR-CON) 20 MEQ tablet Take by mouth. (Patient not taking: Reported on 09/07/2019)     No facility-administered medications prior to visit.     EXAM:  BP 118/78   Pulse 61   Temp (!) 97.5 F (36.4 C) (Oral)   Ht 5' 3.8" (1.621 m)   Wt 156 lb 6.4 oz (70.9 kg)   SpO2 99%   BMI 27.01 kg/m   Body mass index is 27.01 kg/m.  GENERAL: vitals reviewed and listed above, alert, oriented, appears well hydrated and in no acute distress HEENT: atraumatic, conjunctiva  clear, no obvious abnormalities on inspection of external nose and ears OP :masked  NECK: no obvious masses on inspection palpation  LUNGS: clear to auscultation bilaterally, no wheezes, rales or rhonchi, good air movement CV: HRRR, no clubbing cyanosis or  peripheral edema nl cap refill  MS: moves all extremities without noticeable focal  Abnormality Skin patches red alomost ovoid round indistinct legs  Feet arm  Abdomen:  Sof,t normal bowel sounds without hepatosplenomegaly, no guarding rebound or masses no CVA tenderness PSYCH: pleasant and cooperative, no obvious depression or anxiety Lab Results  Component Value Date   WBC 6.0 03/28/2018   HGB 13.5 03/28/2018   HCT 37.9 03/28/2018   PLT 225 03/28/2018   GLUCOSE 89 05/27/2015   CHOL 145 02/09/2014   TRIG 123.0 02/09/2014   HDL 47.90 02/09/2014   LDLDIRECT 109.7 10/11/2010   LDLCALC 73 02/09/2014   ALT 33 05/27/2015   AST 25 05/27/2015   NA 140 05/27/2015   K 4.1 05/27/2015   CL 102 11/12/2013   CREATININE 1.10 (H) 05/19/2019   BUN 18.8 05/27/2015   CO2 27 05/27/2015   TSH 1.146 12/17/2013   INR 1.03 08/28/2013   HGBA1C 5.9 07/15/2018   BP Readings from Last 3 Encounters:  09/07/19 118/78  06/19/19 (!) 128/93  12/08/18 98/68    ASSESSMENT AND PLAN:  Discussed the following assessment and plan:  Vitamin B 12 deficiency - Plan: Vitamin B12  Dermatitis - appears allergic ? plus contact/has had allergy eval dr Starling Manns and seen  derm Ronnald Ramp  Multiple allergies  Vitamin D deficiency - Plan: VITAMIN D 25 Hydroxy (Vit-D Deficiency, Fractures)  Medication management - Plan: VITAMIN D 25 Hydroxy (Vit-D Deficiency, Fractures), Vitamin B12 reviewed [facility] labs   Should be able to do well on SL odt vit b12  Change over instead of injections  Ok to continue vit d high dose weekly for now  Plan labs in 2-3 mos and go from there . Will try topical 7- 10 days  Short term but trial instead of oral steroids when dx is unclear ( higher risk) -Patient advised to return or notify health care team  if  new concerns arise. Reviewed data and eval  Counsel  34 minutes  Patient Instructions  Change to   Dissolvable   B12 1000 mcg per day   ( ODT)  Will get lab in  About 2-3 months   Vit D and Vit B12 level.  Will refill ointment .   And try for a full   7-10 days.   Order lab in 2-3 months .   Standley Brooking. Rayleigh Gillyard M.D.

## 2019-09-07 ENCOUNTER — Ambulatory Visit (INDEPENDENT_AMBULATORY_CARE_PROVIDER_SITE_OTHER): Payer: Medicare Other | Admitting: Internal Medicine

## 2019-09-07 ENCOUNTER — Encounter: Payer: Self-pay | Admitting: Internal Medicine

## 2019-09-07 ENCOUNTER — Other Ambulatory Visit: Payer: Self-pay

## 2019-09-07 VITALS — BP 118/78 | HR 61 | Temp 97.5°F | Ht 63.8 in | Wt 156.4 lb

## 2019-09-07 DIAGNOSIS — L309 Dermatitis, unspecified: Secondary | ICD-10-CM | POA: Diagnosis not present

## 2019-09-07 DIAGNOSIS — Z889 Allergy status to unspecified drugs, medicaments and biological substances status: Secondary | ICD-10-CM

## 2019-09-07 DIAGNOSIS — E538 Deficiency of other specified B group vitamins: Secondary | ICD-10-CM | POA: Diagnosis not present

## 2019-09-07 DIAGNOSIS — E559 Vitamin D deficiency, unspecified: Secondary | ICD-10-CM | POA: Diagnosis not present

## 2019-09-07 DIAGNOSIS — Z79899 Other long term (current) drug therapy: Secondary | ICD-10-CM

## 2019-09-07 MED ORDER — CVS VITAMIN B12 1000 MCG PO TBCR: 1000.0000 ug | EXTENDED_RELEASE_TABLET | Freq: Every day | ORAL | 3 refills | Status: DC

## 2019-09-07 MED ORDER — DESOXIMETASONE 0.25 % EX OINT: 1.0000 "application " | TOPICAL_OINTMENT | Freq: Two times a day (BID) | CUTANEOUS | 1 refills | Status: AC

## 2019-09-07 NOTE — Patient Instructions (Addendum)
Change to   Dissolvable   B12 1000 mcg per day   ( ODT)   Will get lab in  About 2-3 months   Vit D and Vit B12 level.  Will refill ointment .   And try for a full   7-10 days.   Order lab in 2-3 months .

## 2019-09-18 DIAGNOSIS — Z20822 Contact with and (suspected) exposure to covid-19: Secondary | ICD-10-CM | POA: Diagnosis not present

## 2019-09-30 DIAGNOSIS — Z79899 Other long term (current) drug therapy: Secondary | ICD-10-CM | POA: Diagnosis not present

## 2019-09-30 DIAGNOSIS — Z85820 Personal history of malignant melanoma of skin: Secondary | ICD-10-CM | POA: Diagnosis not present

## 2019-09-30 DIAGNOSIS — R21 Rash and other nonspecific skin eruption: Secondary | ICD-10-CM | POA: Diagnosis not present

## 2019-09-30 DIAGNOSIS — L309 Dermatitis, unspecified: Secondary | ICD-10-CM | POA: Diagnosis not present

## 2019-09-30 DIAGNOSIS — Z85828 Personal history of other malignant neoplasm of skin: Secondary | ICD-10-CM | POA: Diagnosis not present

## 2019-11-05 DIAGNOSIS — Z9289 Personal history of other medical treatment: Secondary | ICD-10-CM | POA: Diagnosis not present

## 2019-11-23 DIAGNOSIS — M79675 Pain in left toe(s): Secondary | ICD-10-CM | POA: Diagnosis not present

## 2019-11-23 DIAGNOSIS — M79672 Pain in left foot: Secondary | ICD-10-CM | POA: Diagnosis not present

## 2019-12-07 DIAGNOSIS — M79675 Pain in left toe(s): Secondary | ICD-10-CM | POA: Diagnosis not present

## 2019-12-07 DIAGNOSIS — M25572 Pain in left ankle and joints of left foot: Secondary | ICD-10-CM | POA: Diagnosis not present

## 2019-12-07 DIAGNOSIS — M79672 Pain in left foot: Secondary | ICD-10-CM | POA: Diagnosis not present

## 2019-12-24 ENCOUNTER — Telehealth: Payer: Self-pay | Admitting: Internal Medicine

## 2019-12-24 NOTE — Chronic Care Management (AMB) (Signed)
I left a message  for patient to call back to reschedule appointment. The patient called back a few minutes later and it was changed.   Maia Breslow, [place] Assistant [phone]

## 2019-12-25 ENCOUNTER — Telehealth: Payer: Medicare Other

## 2019-12-28 DIAGNOSIS — M79675 Pain in left toe(s): Secondary | ICD-10-CM | POA: Diagnosis not present

## 2019-12-28 DIAGNOSIS — M79672 Pain in left foot: Secondary | ICD-10-CM | POA: Diagnosis not present

## 2020-01-01 ENCOUNTER — Telehealth: Payer: Self-pay | Admitting: Pharmacist

## 2020-01-01 NOTE — Chronic Care Management (AMB) (Signed)
I left the patient a message about her upcoming appointment on 01/04/2020 @ 1:00 pmwith the clinical pharmacist. She was asked to please have all medication on had to review the pharmacist.   Maia Breslow, [place] Assistant [phone]

## 2020-01-04 ENCOUNTER — Ambulatory Visit: Payer: Medicare Other | Admitting: Pharmacist

## 2020-01-04 DIAGNOSIS — I10 Essential (primary) hypertension: Secondary | ICD-10-CM

## 2020-01-04 DIAGNOSIS — J45909 Unspecified asthma, uncomplicated: Secondary | ICD-10-CM

## 2020-01-04 NOTE — Chronic Care Management (AMB) (Signed)
Chronic Care Management Pharmacy  Name: [name]  MRN: [id number] DOB: 1955/10/10  Initial Questions: 1. Have you seen any other providers since your last visit? Yes  2. Any changes in your medicines or health? No   Chief Complaint/ HPI  [name],  64 y.o. , female presents for their Follow-Up CCM visit with the clinical pharmacist via telephone due to COVID-19 Pandemic.  PCP : Burnis Medin, MD   Their chronic conditions include: Asthma, HTN, Diverticulitis, Osteopenia, Heartburn, Chronic colitis, Atopic dermatitis, Anxiety, Seizures, Allergic rhinitis, urticaria, Thrush, Pain, vitamin B12 deficiency  Office Visits: 09/07/19 [name], MD: Patient presented for follow up for chronic conditions. Plan to transfer care for vitamin B12 levels with PCP. Patient was doing monthly vit B12 injections and prescribed SL 1000 mcg tablet to replace injections and ordered vit B12 level.  Plan for labs in 2-3 months. Continue vit D high dose weekly for now.  06/19/2019- [name], MD- Patient presented for virtual visit for insect bite. Patient stated she had a sting from 2 weeks ago. Patient using cold and ibuprofen and then then 2 days ago, "it blew up". Patient to continue with cold and ibuprofen. Patient prescribed Keflex due to risk for infection. Patient instructed to hold over the weekend and then add if getting swelling and pain. Patient to follow up if symptoms worsen or fail to improve.   02/24/2019- [name], MD- patient presented for virtual visit for headaches, nausea, and malaise. Patient was advised to obtain COVID test. COVID- test result is pending.For eczema, patient offered to try level 1 potency steroid for 2 weeks (not on face). Patient to obtain further evaluation on treatment for eczema through dermatologist.   Consult Visit: 12/07/19 [facility] Patient presented for follow up for pain in left foot. Unable to access notes.  11/23/19 Dylan  Plocki ([facility]): Patient presented for follow up for pain in left foot. Unable to access notes.  08/28/07 [name], MD (radiology): Patient presented for DEXA.  07/07/2019- Surgical Oncology- [place]- Patient presented for follow up. Patient continues close follow up with dermatologist for melanoma of skin. Recent MRI of brain- patient remains in remission for brain metastases. Scans reviewed for metastatic melanoma of liver and patient remains in remission. Surveillance every 6 months. Patient reported painful and extensive rash (macular and blistering) on her back, arms, and legs. Improving on steroid creams. Patient has osteonecrosis of femoral head and has been on intermittent steroids. Patient to obtain a bone density.   03/31/2019- Otolaryngology- Izora Gala, MD- Patient presented for virtual visit for chronic nosebleeds. Recommended patient to increase saline spray up to 20-30 times daily. Recommended to use Afrin on cottonball or tampon to stop active bleeding. Patient to continue using humidifier. Patient to follow-up as needed.   02/11/2019- Gastroenterology- Sylvan Cheese, MD- Patient presented for office visit for follow up consultation in request of Dr. Caryl Comes for microscopic colitis. Patient was given information for Ortikos (budenoside) and copay assistance card. Patient to stop Questran and increase fiber supplement in smoothies. Patient to continue Miralax daily and use Levsin as needed. Colonscopy due in 2018. Patient to follow up in 6 months or sooner as needed.  01/06/2019- Surgical Oncology- Patient presented for office visit for melanoma of skin follow up. Patient to continue with dermatology follow up for lesion on chest wall. Patient to obtain MRI in December.  Patient has been in remission for 5.5 years for metastatic melanoma to liver. Patient to  continue budesonide for IBS.   12/08/2018- Allergy and Immunology- Adelina Mings, MD- Patient  presented for office visit for asthma, cough, urticaria, and rash. Patient prescribed Symbicort 160/4.1mcg, montelukast 10mg  daily, and albuterol 0.083% neb solution. Patient to continue albuterol HFA as needed. Recommended nasal saline spray or lavage. Prescription for azelastine nasal spray and levocetirizine 5mg  was given; as well as for Pataday, 1 drop in each eye as needed. Patient to try lubricant drops as needed. For epistaxis, patient recommended to use Afrin and apply on a cotton ball to stanch the blood flow.   Medications: Outpatient Encounter Medications as of 01/04/2020  Medication Sig Note  . acetaminophen (TYLENOL) 500 MG tablet Take 500 mg by mouth at bedtime.  03/27/2018: Scheduled   . albuterol (PROVENTIL) (2.5 MG/3ML) 0.083% nebulizer solution Take 3 mLs (2.5 mg total) by nebulization every 4 (four) hours as needed for wheezing or shortness of breath.   Marland Kitchen albuterol (VENTOLIN HFA) 108 (90 Base) MCG/ACT inhaler INHALE 2 PUFFS INTO THE LUNGS EVERY 4 (FOUR) HOURS AS NEEDED FOR WHEEZING OR SHORTNESS OF BREATH.   Marland Kitchen augmented betamethasone dipropionate (DIPROLENE) 0.05 % ointment Apply topically 2 (two) times daily. Limit to no longer than 2 weeks use. Not on face.   . Azelastine HCl 0.15 % SOLN Use 1-2 sprays in each nostril 2 times daily as needed.   . bimatoprost (LATISSE) 0.03 % ophthalmic solution APPLY 1 DROP IN BOTH EYES ONCE A DAY   . budesonide-formoterol (SYMBICORT) 160-4.5 MCG/ACT inhaler Inhale 2 puffs into the lungs 2 (two) times daily. Use with spacer.   . calcium carbonate (TUMS - DOSED IN MG ELEMENTAL CALCIUM) 500 MG chewable tablet Chew 1 tablet by mouth daily as needed for indigestion or heartburn.    Marland Kitchen CALCIUM PO Take 1 tablet by mouth daily.   . cholestyramine (QUESTRAN) 4 g packet Take by mouth.   . clonazePAM (KLONOPIN) 1 MG tablet Take 1 mg by mouth 2 (two) times daily.    . cloNIDine (CATAPRES) 0.1 MG tablet Take 1 tablet (0.1 mg total) by mouth at bedtime.   .  cyanocobalamin (,VITAMIN B-12,) 1000 MCG/ML injection Inject 1 mL (1,000 mcg total) into the muscle every 30 (thirty) days.   . Cyanocobalamin ([facility] VITAMIN B12) 1000 MCG TBCR Place 1 tablet (1,000 mcg total) under the tongue daily.   . Desoximetasone (TOPICORT) 0.25 % ointment Apply 1 application topically 2 (two) times daily.   . diphenhydrAMINE (BENADRYL) 25 MG tablet Take 25 mg by mouth at bedtime as needed for allergies or sleep.    . fluorometholone (FML) 0.1 % ophthalmic suspension Place 1 drop into both eyes 4 (four) times daily as needed (itching).    . fluorometholone (FML) 0.1 % ophthalmic suspension Administer 1 drop to both eyes 4 (four) times a day as needed. (Patient not taking: Reported on 09/07/2019)   . fluticasone (FLONASE) 50 MCG/ACT nasal spray Place 2 sprays into both nostrils daily. (Patient taking differently: Place 2 sprays into both nostrils as needed. )   . fluticasone furoate-vilanterol (BREO ELLIPTA) 100-25 MCG/INH AEPB Inhale 1 puff into the lungs daily. For wheezing suppression   . hydrocortisone (ANUSOL-HC) 25 MG suppository Place 1 suppository (25 mg total) rectally at bedtime as needed for hemorrhoids. (Patient taking differently: Place 25 mg rectally at bedtime as needed for hemorrhoids. )   . hydrocortisone 2.5 % cream Apply 1 application topically 2 (two) times daily as needed (irritation).   . hyoscyamine (LEVSIN SL) 0.125 MG  SL tablet Place under the tongue.   Marland Kitchen ibuprofen (ADVIL,MOTRIN) 200 MG tablet Take 200-400 mg by mouth 3 (three) times daily as needed for moderate pain.    Marland Kitchen levETIRAcetam (KEPPRA) 250 MG tablet Take 250 mg by mouth 3 (three) times daily. 6 am, 12 pm, 6 pm   . levocetirizine (XYZAL) 5 MG tablet TAKE 1 TABLET BY MOUTH EVERY DAY AS NEEDED   . lipase/protease/amylase (CREON) 12000 units CPEP capsule Take 12,000 Units by mouth daily with breakfast. 03/27/2018: Takes only with breakfast  . loratadine (CLARITIN) 10 MG tablet Take 10 mg by mouth daily.    Lita Mains (ZYLET) 0.5-0.3 % SUSP Administer 1 drop to the right eye daily as needed. (Patient not taking: Reported on 09/07/2019)   . metoprolol tartrate (LOPRESSOR) 25 MG tablet TAKE 1 TABLET (25 MG TOTAL) BY MOUTH 2 (TWO) TIMES DAILY.   . montelukast (SINGULAIR) 10 MG tablet Take 1 tablet (10 mg total) by mouth at bedtime. (Patient not taking: Reported on 09/07/2019)   . nystatin (MYCOSTATIN) 100000 UNIT/ML suspension Use as directed 5 mLs in the mouth or throat 3 (three) times daily as needed (thrush).   . Olopatadine HCl (PATADAY) 0.2 % SOLN Use one drop in each eye once daily as needed.   . polyethylene glycol (MIRALAX / GLYCOLAX) packet Take 17 g by mouth at bedtime.   . potassium chloride SA (K-DUR,KLOR-CON) 20 MEQ tablet Take 1 tablet (20 mEq total) by mouth 2 (two) times daily.   . prednisoLONE acetate (PRED FORTE) 1 % ophthalmic suspension SMARTSIG:In Eye(s)   . sodium chloride (OCEAN) 0.65 % SOLN nasal spray Place 1 spray into both nostrils as needed for congestion.    . Syringe/Needle, Disp, (SYRINGE 3CC/27GX1-1/4") 27G X 1-1/4" 3 ML MISC Inject 1 mL into the muscle every 30 (thirty) days.   Marland Kitchen tacrolimus (PROTOPIC) 0.1 % ointment Apply 1 application topically 2 (two) times daily.   . Vitamin D, Ergocalciferol, (DRISDOL) 50000 units CAPS capsule TAKE 1 CAPSULE (50,000 UNITS TOTAL) BY MOUTH EVERY 7 (SEVEN) DAYS. (Patient taking differently: Take 50,000 Units by mouth See admin instructions. Take 50000 units by mouth every Mon, Wed, and Sat)    No facility-administered encounter medications on file as of 01/04/2020.   Patient reports her biggest complaint right now is that she has rashes all over since getting the first dose of the COVID vaccine and can't even wear her watch because of it. She has seen dermatology multiple times and they told her they weren't sure what else they could do for her.   Current Diagnosis/Assessment:  Goals Addressed            This Visit's  Progress   . Pharmacy Care Plan       CARE PLAN ENTRY  Current Barriers:  . Chronic Disease Management support, education, and care coordination needs related to Hypertension, Asthma, and allergic rhinitis    Hypertension . Pharmacist Clinical Goal(s): o Over the next 180 days, patient will work with PharmD and providers to maintain BP goal <140/90 . Current regimen:   Clonidine 0.1mg , 1 tablet at bedtime  Metoprolol tartrate 25mg , 1 tablet twice daily . Interventions: o We discussed taking precaution standing up and taking time to take first step  . Patient self care activities - Over the next 180 days, patient will: o Check blood pressure daily, document, and provide at future appointments o Patient will discuss with Dr. Caryl Comes about switching blood pressure medications to avoid additive dizziness/lightheadedness  from current medications.  Asthma . Pharmacist Clinical Goal(s) o Over the next 180 days, patient will work with PharmD and providers to maintain breathing controlled.  . Current regimen:   Albuterol 0.083% nebulizer solution, 3 MLs every four hours as needed for wheezing or shortness of breath  Albuterol HFA 175mcg/act inhaler, 2 puffs every hours as needed for wheezing or shortness of breath  Budesonide/ formorterol (Symbicort) 160-4.54mcg/ act inhaler, 2 puffs twice daily  Fluticasone/ vilanterol (Breo Ellipta), 1 puff once daily  . Interventions: o We discussed duplication: duplication with budesonide/ formorterol (Symbicort) and fluticasone/ vlanterol (Breo Ellipta) . Patient self care activities o Patient will continue current medications as prescribed.   Allergic rhinitis  . Pharmacist Clinical Goal(s) o Over the next 180 days, patient will work with PharmD and providers to minimize allergy and rash symptoms . Current regimen:   levocetirizine (Xyzal) 5mg , 1 tablet everday as needed . Patient self care activities - patient will: o Continue current  medications as directed by providers.   Vitamin B12 deficiency  . Pharmacist Clinical Goal(s) o Over the next 180 days, patient will work with PharmD and providers to minimize allergy and rash symptoms . Current regimen:   Vitamin B12 1000 mcg 1 tablet daily . Interventions: o We discussed scheduling lab appointment to get vitamin B12 and vitamin D rechecked . Patient self care activities o Patient will continue current medications as prescribed.   Medication management . Pharmacist Clinical Goal(s): o Over the next 180 days, patient will work with PharmD and providers to maintain optimal medication adherence . [facility]  . Interventions o Comprehensive medication review performed. o Continue current medication management strategy o Consulting with Dr. Regis Bill on obtaining vitamin B12, lipid panel, and inflammatory markers.  . Patient self care activities - Over the next 180 days, patient will: o Take medications as prescribed o Report any questions or concerns to PharmD and/or provider(s)  Please see past updates related to this goal by clicking on the "Past Updates" button in the selected goal          Moderate persistent Asthma    Eosinophil count:   Lab Results  Component Value Date/Time   EOSPCT 1.9 05/27/2015 09:24 AM  %                               Eos (Absolute):  Lab Results  Component Value Date/Time   EOSABS 0.1 05/27/2015 09:24 AM   Tobacco Status:  Social History   Tobacco Use  Smoking Status Never Smoker  Smokeless Tobacco Never Used   Patient has failed these meds in past: none   Patient is currently controlled on the following medications:   Albuterol 0.083% nebulizer solution, 3 MLs every four hours as needed for wheezing or shortness of breath  Albuterol HFA 121mcg/act inhaler, 2 puffs every hours as needed for wheezing or shortness of breath  Budesonide/ formorterol (Symbicort) 160-4.4mcg/ act inhaler, 2 puffs twice daily as  needed  Fluticasone/ vilanterol (Breo Ellipta), 1 puff once daily   Using maintenance inhaler regularly? No - states only using as needed (3x per month). Depending on weather. When humid, will tend to use more often.   Frequency of rescue inhaler use:  prn- patient states only when she is outside  Nebulizer- has only used about 3x since last prescribed   We discussed:  proper inhaler technique - duplication with budesonide/ formorterol (Symbicort) and fluticasone/  vlanterol (Breo Ellipta): patient states does not use together and has not needed to use Breo Ellipta in over a year. Will use only if her breathing gets "bad"  -Been using Symbicort every day for the last couple of weeks as she has been wheezing more lately -She hasnt used the albuterol in about 6 months  Plan Continue current medications  Hypertension  Endorses dizziness/ lightheadedness and main reason why she has pacemaker. She stated she can't move quickly especially heat sensitive. Has been experiencing since 1998, but currently reports being stable  And interested changing medications.   Office blood pressures are  BP Readings from Last 3 Encounters:  09/07/19 118/78  06/19/19 (!) 128/93  12/08/18 98/68   Patient has failed these meds in the past: none   Patient checks BP at home 3-5x per week   Patient home BP readings are ranging: 135/90 (if in pain); as low as 90/56 (not often)   Patient is controlled on:   Clonidine 0.1mg , 1 tablet at bedtime  Metoprolol tartrate 25mg , 1 tablet twice daily   We discussed taking precaution standing up and taking time to take first step   Plan Managed by cardiology Virl Axe). Patient will discuss with Dr. Caryl Comes about switching BP medications to avoid orthostatic hypotension.  Continue current medications   Diverticulitis    Patient is currently controlled on the following medications:   Miralax, 17 g at bedtime  Plan Continue current  medications  Osteopenia   Patient states she has always low on vitamin D.   Last DEXA Scan: 07/31/19 T scores: Spine: -2.0 Left femoral neck: -2.0   Vit D, 25-Hydroxy  Date Value Ref Range Status  10/24/2009 24 (L) 30 - 89 ng/mL Final    Comment:    See lab report for associated comment(s)    [facility] Vitamin D: 38.1 (01/06/2019)  Patient is currently on the following medications:   Vitamin D (ergocalciferol) 50,000 units, 1 capsule every Monday, Wednesday, and Saturday  Calcium 500 mg, 1 tablet once daily   We discussed:  Recommend [phone] units of vitamin D daily. Recommend 1200 mg of calcium daily from dietary and supplemental sources.  Plan  Continue current medications   Heartburn   Patient has failed these meds in past: esomeprazole   Patient is currently controlled on the following medications:   Calcium carbonate (Tums) 500mg , chew 1 tablet daily as needed for indigestion or heartburn   Plan Continue current medications  Chronic colitis    Patient has failed these meds in past: none   Patient is currently controlled on the following medications:   Lipase/ protease/ amylase (Creon) 12,000 units, 1 capsule once daily with breakfast   Plan Continue current medications  Atopic dermatitis  Patient told to use sparingly to decrease skin thinning.  Patient reports needing to take diphenhydramine consistently to keep rash under control.   Patient is currently controlled on the following medications:   Betamethasone dipropionate 0.05% ointment, apply two times daily. Limit to no longer than 2 weeks use. Not on face.  Desoximetasone 8.10% ointment, 1 application twice daily  Diphenhydramine (Benadryl) 25mg , 1 tablet at bedtime as needed   Plan Managed by Dr. Ronnald Ramp (dermatologist). Continue current medications  Anxiety   Patient is currently controlled on the following medications:   Clonazepam 1mg , 1 tablet twice daily   Plan Continue  current medications  Seizures   Patient states she was told by neurosurgeon she will never get off of it  due to history of brain cancer (had 2 brain surgery).   Patient is currently controlled on the following medications:   levetiracetam 250mg , 1 tablet three times daily  Plan Continue current medications  Allergic rhinitis/ urticaria    Patient has failed these meds in the past: Montelukast (patient reported lethargy)   Patient is currently controlled on the following medications:   Fluticasone (Flonase) 79mcg/act nasal spray, 2 sprays into both nostrils daily  levocetirizine (Xyzal) 5mg , 1 tablet everday as needed  Sodium chloride 0.65% nasal spray solution, 1 spray into both nostrils as needed for congesion (uses 3 to 4 x/ day)   Olopatadine (Pataday) 0.2% solution, 1 drop in each eye once daily as needed  We discussed:   - therapy duplication: levocetirizine, loratadine, diphenhydramine: patient stated she does not use antihistamines at same time. Will use diphenhydramine to calm down rash. -fall is the worst time of year for her allergies - patient reports she is currently using Xyzal and Claritin was not helping so she stopped using it  Plan Continue current medications  Thrush   Patient reported after immunotherapy and cancer therapy, stress causes thrush breakout.   Patient is currently controlled on the following medications:  - nystatin 100000 unit/ ml, 5 MLs three times daily as needed thrush   Plan Continue current medications  Pain    Patient is currently controlled on the following medications:   Ibuprofen 200mg , 200-400mg  three times daily as needed for moderate pain  Acetaminophen 500mg , 1 tablet at bedtime   We discussed: patient had fractured her toe and they saw arthritis on her x-ray; suggested trying a topical product such as Voltaren gel but she prefers not to and has had success with yunkers ointment from medical supply   Plan Continue  current medications  Vitamin B12 deficiency   Reports being deficient in the past.  Unclear who is monitoring blood levels.  Last vitamin B12 in chart 2011; last level   Vitamin B12: 522 (10/24/2009)   Patient is currently on the following medications:   Cyanocobalamin 1000 mcg SL 1 tablet daily   We discussed:   - encouraged patient to schedule lab visit for vit B12 and vit D -Patient inquired about getting all labs done with oncology tomorrow   Plan Will discuss with PCP about adding labs to tomorrow's visit with [facility].  Continue current medications and reassess at lab results  Potassium supplementation   Potassium: 5.0 (07/07/2019)   Patient is currently  on the following medications:   Potassium chloride 42mEq, 1 tablet twice daily  Plan Continue current medications  Hemorrhoids    Patient is currently on the following medications:  Hydrocortisone 25mg  suppository, place 1 rectally at bedtime as needed for hemorrhoids  Plan Continue current medications   Vaccines   Reviewed and discussed patient's vaccination history.    Immunization History  Administered Date(s) Administered  . Influenza Split 03/03/2012  . PFIZER SARS-COV-2 Vaccination 05/01/2019  . PPD Test 07/01/2013  . Pneumococcal Polysaccharide-23 10/03/2012   Patient reacted to 1st dose of COVID vaccine and states she was told to hold off from 2nd dose of COVID.    Patient reports she did have a reaction to a childhood vaccine but cannot recall the name.  Patient reported she thought she received shingles vaccine in doctor's office. Recommended for patient to get shingles, tetanus, and influenza vaccines.   Medication Management   [facility] Uses pill box? Yes - uses pill  box and has reminders for phone  Pt endorses 100% compliance  We discussed: Current pharmacy is preferred with insurance plan and patient is satisfied with pharmacy services  Plan  Continue  current medication management strategy   Follow up: 6 month phone visit 3 month CPA follow up for general assessment.  Jeni Salles, PharmD Clinical Pharmacist [facility] [place]

## 2020-01-04 NOTE — Patient Instructions (Addendum)
Hi Yared,  It was lovely to get to meet you over the phone today! I hope everything goes well tomorrow! As a reminder, some of the things we discussed to consider at some point:  1. Getting shingles vaccine at pharmacy and tetanus and flu shots in the doctors office or in the pharmacy, if you haven't already.  2. If you cannot get your vitamin B12 and vitamin D level checked tomorrow, please call to schedule an appointment with the lab.  3. Consider discussing with Dr. Caryl Comes about switching blood pressure medications to avoid lightheadedness or dizziness when standing.  4. Make sure to get the recommended amount of calcium, 1200 mg per day, through diet or supplementation in order to strengthen your bones.  Please give me a call if you have any questions or need anything before our next touch base!    Best, [name]  Jeni Salles, PharmD Clinical Pharmacist [place] [facility]    Visit Information  Goals Addressed            This Visit's Progress   . Pharmacy Care Plan       CARE PLAN ENTRY  Current Barriers:  . Chronic Disease Management support, education, and care coordination needs related to Hypertension, Asthma, and allergic rhinitis    Hypertension . Pharmacist Clinical Goal(s): o Over the next 180 days, patient will work with PharmD and providers to maintain BP goal <140/90 . Current regimen:   Clonidine 0.1mg , 1 tablet at bedtime  Metoprolol tartrate 25mg , 1 tablet twice daily . Interventions: o We discussed taking precaution standing up and taking time to take first step  . Patient self care activities - Over the next 180 days, patient will: o Check blood pressure daily, document, and provide at future appointments o Patient will discuss with Dr. Caryl Comes about switching blood pressure medications to avoid additive dizziness/lightheadedness from current medications.  Asthma . Pharmacist Clinical Goal(s) o Over the next 180 days,  patient will work with PharmD and providers to maintain breathing controlled.  . Current regimen:   Albuterol 0.083% nebulizer solution, 3 MLs every four hours as needed for wheezing or shortness of breath  Albuterol HFA 122mcg/act inhaler, 2 puffs every hours as needed for wheezing or shortness of breath  Budesonide/ formorterol (Symbicort) 160-4.79mcg/ act inhaler, 2 puffs twice daily  Fluticasone/ vilanterol (Breo Ellipta), 1 puff once daily  . Interventions: o We discussed duplication: duplication with budesonide/ formorterol (Symbicort) and fluticasone/ vlanterol (Breo Ellipta) . Patient self care activities o Patient will continue current medications as prescribed.   Allergic rhinitis  . Pharmacist Clinical Goal(s) o Over the next 180 days, patient will work with PharmD and providers to minimize allergy and rash symptoms . Current regimen:   levocetirizine (Xyzal) 5mg , 1 tablet everday as needed . Patient self care activities - patient will: o Continue current medications as directed by providers.   Vitamin B12 deficiency  . Pharmacist Clinical Goal(s) o Over the next 180 days, patient will work with PharmD and providers to minimize allergy and rash symptoms . Current regimen:   Vitamin B12 1000 mcg 1 tablet daily . Interventions: o We discussed scheduling lab appointment to get vitamin B12 and vitamin D rechecked . Patient self care activities o Patient will continue current medications as prescribed.   Medication management . Pharmacist Clinical Goal(s): o Over the next 180 days, patient will work with PharmD and providers to maintain optimal medication adherence . Current pharmacy: [facility]  . Interventions o  Comprehensive medication review performed. o Continue current medication management strategy o Consulting with Dr. Regis Bill on obtaining vitamin B12, lipid panel, and inflammatory markers.  . Patient self care activities - Over the next 180 days, patient will: o Take  medications as prescribed o Report any questions or concerns to PharmD and/or provider(s)  Please see past updates related to this goal by clicking on the "Past Updates" button in the selected goal         The patient verbalized understanding of instructions provided today and declined a print copy of patient instruction materials.   Telephone follow up appointment with pharmacy team member scheduled for: 6 months

## 2020-01-05 DIAGNOSIS — R21 Rash and other nonspecific skin eruption: Secondary | ICD-10-CM | POA: Diagnosis not present

## 2020-01-05 DIAGNOSIS — Z79899 Other long term (current) drug therapy: Secondary | ICD-10-CM | POA: Diagnosis not present

## 2020-01-05 DIAGNOSIS — Z6827 Body mass index (BMI) 27.0-27.9, adult: Secondary | ICD-10-CM | POA: Diagnosis not present

## 2020-01-05 DIAGNOSIS — C787 Secondary malignant neoplasm of liver and intrahepatic bile duct: Secondary | ICD-10-CM | POA: Diagnosis not present

## 2020-01-05 DIAGNOSIS — Z7189 Other specified counseling: Secondary | ICD-10-CM | POA: Diagnosis not present

## 2020-01-05 DIAGNOSIS — C439 Malignant melanoma of skin, unspecified: Secondary | ICD-10-CM | POA: Diagnosis not present

## 2020-01-05 DIAGNOSIS — K580 Irritable bowel syndrome with diarrhea: Secondary | ICD-10-CM | POA: Diagnosis not present

## 2020-01-07 DIAGNOSIS — L309 Dermatitis, unspecified: Secondary | ICD-10-CM | POA: Diagnosis not present

## 2020-01-07 DIAGNOSIS — Z85820 Personal history of malignant melanoma of skin: Secondary | ICD-10-CM | POA: Diagnosis not present

## 2020-01-07 DIAGNOSIS — Z85828 Personal history of other malignant neoplasm of skin: Secondary | ICD-10-CM | POA: Diagnosis not present

## 2020-01-19 DIAGNOSIS — Z20822 Contact with and (suspected) exposure to covid-19: Secondary | ICD-10-CM | POA: Diagnosis not present

## 2020-02-08 DIAGNOSIS — J3089 Other allergic rhinitis: Secondary | ICD-10-CM | POA: Diagnosis not present

## 2020-02-08 DIAGNOSIS — R21 Rash and other nonspecific skin eruption: Secondary | ICD-10-CM | POA: Diagnosis not present

## 2020-02-08 DIAGNOSIS — Z91018 Allergy to other foods: Secondary | ICD-10-CM | POA: Diagnosis not present

## 2020-02-08 DIAGNOSIS — J4530 Mild persistent asthma, uncomplicated: Secondary | ICD-10-CM | POA: Diagnosis not present

## 2020-02-11 DIAGNOSIS — Z91018 Allergy to other foods: Secondary | ICD-10-CM | POA: Diagnosis not present

## 2020-03-15 ENCOUNTER — Encounter: Payer: Self-pay | Admitting: Internal Medicine

## 2020-03-15 ENCOUNTER — Telehealth (INDEPENDENT_AMBULATORY_CARE_PROVIDER_SITE_OTHER): Payer: Medicare Other | Admitting: Internal Medicine

## 2020-03-15 DIAGNOSIS — Z634 Disappearance and death of family member: Secondary | ICD-10-CM

## 2020-03-15 DIAGNOSIS — Z889 Allergy status to unspecified drugs, medicaments and biological substances status: Secondary | ICD-10-CM | POA: Diagnosis not present

## 2020-03-15 DIAGNOSIS — B029 Zoster without complications: Secondary | ICD-10-CM

## 2020-03-15 DIAGNOSIS — R21 Rash and other nonspecific skin eruption: Secondary | ICD-10-CM

## 2020-03-15 MED ORDER — VALACYCLOVIR HCL 1 G PO TABS: 1000.0000 mg | ORAL_TABLET | Freq: Three times a day (TID) | ORAL | 0 refills | Status: DC

## 2020-03-15 NOTE — Progress Notes (Signed)
History of present illness  Pt thinks that she has shingles.   Rash is around both ankles. Red, raised bumps and painful. Pt states that it is in the shape of a band that goes almost all the way around ankles.   Pt has tried hydrocortisone cream rx'ed by derm for dermitis. Cream has calmed it down but it is still uncomfortable. It has taken the redness away some but not the pain.

## 2020-03-15 NOTE — Progress Notes (Signed)
Virtual Visit via Video Note  I connected with@ on 03/15/20 at  3:30 PM EST by a video enabled telemedicine application and verified that I am speaking with the correct person using two identifiers. Location patient: home Location provider: home office Persons participating in the virtual visit: patient, provider Video visit   ( in person not available today ) WIth national recommendations  regarding COVID 19 pandemic   video visit is advised over in office visit for this patient.  Patient aware  of the limitations of evaluation and management by telemedicine and  availability of in person appointments. and agreed to proceed.   HPI: [name] presents for video visit onset days ago with pain ful blotchy rash small blisters  right neck  Not past midline.   Thinks could be shingles  . No fever achy.  past hx remotely of shingles and reminiscent.   Rash bumps around right ankle  Predated above Had eval dr Harold Hedge and allergic to a lot  But thi rash in not itchy  ROS: See pertinent positives and negatives per HPI. faher had cva and  Passed    december x mas time new years  . Was at hospital  But limited exposures  Had allergy testing  Dr Harold Hedge  Allergic to amy things including red meat pork .   Past Medical History:  Diagnosis Date  . Angio-edema   . Arthritis   . Asthma   . Atrial fibrillation ([place])   . Atrial tachycardia ([place])   . Cervicalgia   . Complication of anesthesia    pt states wakes up with "shakes"  . CVA (cerebral infarction)    2012 with dizziness and vision change felt embolic from atrial tachy  . Disorder of bone and cartilage, unspecified   . Disturbance of skin sensation   . Diverticulosis   . DM (diabetes mellitus) ([place])   . DVT (deep venous thrombosis) ([place])   . DVT (deep venous thrombosis) ([place])   . Eczema   . Family history of anesthesia complication    PONV  . Fatty liver   . GERD (gastroesophageal reflux disease)   . History of hiatal  hernia   . History of pacemaker   . History of radiation therapy 10/24/12   brain  . HLD (hyperlipidemia)   . HTN (hypertension)   . Hypoglycemia, unspecified   . 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 Liver metastasis ([place])   . Lung metastasis ([place])   . Metastasis to brain ([place])   . Osteopenia   . Other nonspecific abnormal serum enzyme levels   . Other specified congenital anomalies of nervous system   . Pacemaker    autonomic dysfunction  . Pancreatitis    from therapy  . Peripheral vascular disease ([place])   . Pneumonia   . PONV (postoperative nausea and vomiting)   . POTS (postural orthostatic tachycardia syndrome)   . Recurrent upper respiratory infection (URI)   . Skin cancer    Hx: of lung lesion  . Stroke [facility])    left weaker   . Swelling of limb   . Urticaria     Past Surgical History:  Procedure Laterality Date  . ABLATION SAPHENOUS VEIN W/ RFA     2002  . ADENOIDECTOMY    . CARPAL TUNNEL RELEASE Left   . CHOLECYSTECTOMY  03/28/2018  . CHOLECYSTECTOMY N/A 03/28/2018   Procedure: LAPAROSCOPIC CHOLECYSTECTOMY WITH INTRAOPERATIVE CHOLANGIOGRAM;  Surgeon: Hassell Done,  Rodman Key, MD;  Location: [facility];  Service: General;  Laterality: N/A;  . CRANIOTOMY N/A 09/30/2012   suboccipital craniectomy  . CRANIOTOMY Left 02/09/2013   Procedure: LEFT PARIETAL CRANIOTOMY with stealth;  Surgeon: [name], MD;  Location: [facility];  Service: Neurosurgery;  Laterality: Left;  LEFT Parietal Craniotomy for tumor with stealth  . DEEP AXILLARY SENTINEL NODE BIOPSY / EXCISION     due to extensive Melanoma-right arm  . fatty tumor removed     from chest  . INSERT / REPLACE / REMOVE PACEMAKER  2012   1999, x 3  . LAPAROSCOPY  09/06/2011   Procedure: LAPAROSCOPY OPERATIVE;  Surgeon: Claiborne Billings A. Pamala Hurry, MD;  Location: [facility];  Service: Gynecology;  Laterality: Left;  with Left Ovarian Cystectomy   . MELANOMA EXCISION     with removal of lymph nodes, left  shoulder  . NOSE SURGERY  2010, 2012   for nose bleeds x 2  . PACEMAKER IMPLANT    . SINOSCOPY    . TONSILLECTOMY AND ADENOIDECTOMY      Family History  Problem Relation Age of Onset  . Allergic rhinitis Sister   . Stroke Mother   . Colon polyps Mother   . Colon cancer Mother   . Urticaria Mother   . Allergic rhinitis Mother   . Heart disease Father   . Diabetes Father   . Kidney disease Father   . Diabetes Maternal Grandmother   . Clotting disorder Maternal Grandmother        stroke  . Crohn's disease Maternal Grandmother   . Diabetes Paternal Grandmother   . Aneurysm Sister        brain    Social History   Tobacco Use  . Smoking status: Never Smoker  . Smokeless tobacco: Never Used  Vaping Use  . Vaping Use: Never used  Substance Use Topics  . Alcohol use: Not Currently    Comment: glass of wine daily  . Drug use: No      Current Outpatient Medications:  .  acetaminophen (TYLENOL) 500 MG tablet, Take 500 mg by mouth at bedtime., Disp: , Rfl:  .  albuterol (PROVENTIL) (2.5 MG/3ML) 0.083% nebulizer solution, Take 3 mLs (2.5 mg total) by nebulization every 4 (four) hours as needed for wheezing or shortness of breath., Disp: 180 mL, Rfl: 1 .  albuterol (VENTOLIN HFA) 108 (90 Base) MCG/ACT inhaler, INHALE 2 PUFFS INTO THE LUNGS EVERY 4 (FOUR) HOURS AS NEEDED FOR WHEEZING OR SHORTNESS OF BREATH., Disp: 18 Inhaler, Rfl: 0 .  augmented betamethasone dipropionate (DIPROLENE) 0.05 % ointment, Apply topically 2 (two) times daily. Limit to no longer than 2 weeks use. Not on face., Disp: 50 g, Rfl: 0 .  bimatoprost (LATISSE) 0.03 % ophthalmic solution, APPLY 1 DROP IN BOTH EYES ONCE A DAY, Disp: , Rfl:  .  budesonide-formoterol (SYMBICORT) 160-4.5 MCG/ACT inhaler, Inhale 2 puffs into the lungs 2 (two) times daily. Use with spacer., Disp: 1 Inhaler, Rfl: 5 .  calcium carbonate (TUMS - DOSED IN MG ELEMENTAL CALCIUM) 500 MG chewable tablet, Chew 1 tablet by mouth daily as needed  for indigestion or heartburn. , Disp: , Rfl:  .  CALCIUM PO, Take 1 tablet by mouth daily., Disp: , Rfl:  .  clonazePAM (KLONOPIN) 1 MG tablet, Take 1 mg by mouth 2 (two) times daily. , Disp: , Rfl:  .  cloNIDine (CATAPRES) 0.1 MG tablet, Take 1 tablet (0.1 mg total) by mouth at bedtime., Disp: 90  tablet, Rfl: 3 .  cyanocobalamin (,VITAMIN B-12,) 1000 MCG/ML injection, Inject 1 mL (1,000 mcg total) into the muscle every 30 (thirty) days., Disp: 12 mL, Rfl: 0 .  Cyanocobalamin ([facility] VITAMIN B12) 1000 MCG TBCR, Place 1 tablet (1,000 mcg total) under the tongue daily., Disp: 90 tablet, Rfl: 3 .  Desoximetasone (TOPICORT) 0.25 % ointment, Apply 1 application topically 2 (two) times daily., Disp: 30 g, Rfl: 1 .  diphenhydrAMINE (BENADRYL) 25 MG tablet, Take 25 mg by mouth at bedtime as needed for allergies or sleep. , Disp: , Rfl:  .  fluorometholone (FML) 0.1 % ophthalmic suspension, Place 1 drop into both eyes 4 (four) times daily as needed (itching). , Disp: , Rfl: 1 .  fluorometholone (FML) 0.1 % ophthalmic suspension, Administer 1 drop to both eyes 4 (four) times a day as needed., Disp: , Rfl:  .  hydrocortisone (ANUSOL-HC) 25 MG suppository, Place 1 suppository (25 mg total) rectally at bedtime as needed for hemorrhoids., Disp: 12 suppository, Rfl: 0 .  hydrocortisone 2.5 % cream, Apply 1 application topically 2 (two) times daily as needed (irritation)., Disp: 30 g, Rfl: 0 .  hyoscyamine (LEVSIN SL) 0.125 MG SL tablet, Place under the tongue., Disp: , Rfl:  .  ibuprofen (ADVIL,MOTRIN) 200 MG tablet, Take 200-400 mg by mouth 3 (three) times daily as needed for moderate pain. , Disp: , Rfl:  .  levETIRAcetam (KEPPRA) 250 MG tablet, Take 250 mg by mouth 3 (three) times daily. 6 am, 12 pm, 6 pm, Disp: , Rfl:  .  levocetirizine (XYZAL) 5 MG tablet, TAKE 1 TABLET BY MOUTH EVERY DAY AS NEEDED, Disp: 30 tablet, Rfl: 0 .  lipase/protease/amylase (CREON) 12000 units CPEP capsule, Take 12,000 Units by mouth  daily with breakfast., Disp: , Rfl:  .  loratadine (CLARITIN) 10 MG tablet, Take 10 mg by mouth daily., Disp: , Rfl:  .  Loteprednol-Tobramycin (ZYLET) 0.5-0.3 % SUSP, Administer 1 drop to the right eye daily as needed., Disp: , Rfl:  .  metoprolol tartrate (LOPRESSOR) 25 MG tablet, TAKE 1 TABLET (25 MG TOTAL) BY MOUTH 2 (TWO) TIMES DAILY., Disp: 180 tablet, Rfl: 3 .  montelukast (SINGULAIR) 10 MG tablet, Take 1 tablet (10 mg total) by mouth at bedtime., Disp: 30 tablet, Rfl: 5 .  nystatin (MYCOSTATIN) 100000 UNIT/ML suspension, Use as directed 5 mLs in the mouth or throat 3 (three) times daily as needed (thrush)., Disp: , Rfl:  .  Olopatadine HCl (PATADAY) 0.2 % SOLN, Use one drop in each eye once daily as needed., Disp: 2.5 mL, Rfl: 5 .  polyethylene glycol (MIRALAX / GLYCOLAX) packet, Take 17 g by mouth at bedtime., Disp: , Rfl:  .  potassium chloride SA (K-DUR,KLOR-CON) 20 MEQ tablet, Take 1 tablet (20 mEq total) by mouth 2 (two) times daily., Disp: 60 tablet, Rfl: 3 .  prednisoLONE acetate (PRED FORTE) 1 % ophthalmic suspension, SMARTSIG:In Eye(s), Disp: , Rfl:  .  sodium chloride (OCEAN) 0.65 % SOLN nasal spray, Place 1 spray into both nostrils as needed for congestion. , Disp: , Rfl:  .  Syringe/Needle, Disp, (SYRINGE 3CC/27GX1-1/4") 27G X 1-1/4" 3 ML MISC, Inject 1 mL into the muscle every 30 (thirty) days., Disp: 12 each, Rfl: 0 .  valACYclovir (VALTREX) 1000 MG tablet, Take 1 tablet (1,000 mg total) by mouth 3 (three) times daily. For shingles, Disp: 21 tablet, Rfl: 0 .  Vitamin D, Ergocalciferol, (DRISDOL) 50000 units CAPS capsule, TAKE 1 CAPSULE (50,000 UNITS TOTAL) BY MOUTH  EVERY 7 (SEVEN) DAYS. (Patient taking differently: Take 50,000 Units by mouth See admin instructions. Take 50000 units by mouth every Mon, Wed, and Sat), Disp: 4 capsule, Rfl: 11 .  Azelastine HCl 0.15 % SOLN, Use 1-2 sprays in each nostril 2 times daily as needed. (Patient not taking: Reported on 03/15/2020), Disp: 30  mL, Rfl: 5 .  EPINEPHrine 0.3 mg/0.3 mL IJ SOAJ injection, SMARTSIG:0.3 Milliliter(s) IM Once PRN, Disp: , Rfl:  .  fluticasone (FLONASE) 50 MCG/ACT nasal spray, Place 2 sprays into both nostrils daily. (Patient not taking: Reported on 03/15/2020), Disp: 16 g, Rfl: 12 .  fluticasone furoate-vilanterol (BREO ELLIPTA) 100-25 MCG/INH AEPB, Inhale 1 puff into the lungs daily. For wheezing suppression (Patient not taking: Reported on 03/15/2020), Disp: 1 each, Rfl: 3  EXAM: BP Readings from Last 3 Encounters:  09/07/19 118/78  06/19/19 (!) 128/93  12/08/18 98/68    VITALS per patient if applicable:  GENERAL: alert, oriented, appears well and in no acute distress  HEENT: atraumatic, conjunttiva clear, no obvious abnormalities on inspection of external nose and ears  NECK: normal movements of the head and neck  LUNGS: on inspection no signs of respiratory distress, breathing rate appears normal, no obvious gross SOB, gasping or wheezing  CV: no obvious cyanosis Skin  Right neck  With linear blotchy pink red   Rash  No mast midline   Right ankle with pink lump area  No blisters   MS: moves all visible extremities without noticeable abnormality  PSYCH/NEURO: pleasant and cooperative, no obvious depression or anxiety, speech and thought processing grossly intact Lab Results  Component Value Date   WBC 6.0 03/28/2018   HGB 13.5 03/28/2018   HCT 37.9 03/28/2018   PLT 225 03/28/2018   GLUCOSE 89 05/27/2015   CHOL 145 02/09/2014   TRIG 123.0 02/09/2014   HDL 47.90 02/09/2014   LDLDIRECT 109.7 10/11/2010   LDLCALC 73 02/09/2014   ALT 33 05/27/2015   AST 25 05/27/2015   NA 140 05/27/2015   K 4.1 05/27/2015   CL 102 11/12/2013   CREATININE 1.10 (H) 05/19/2019   BUN 18.8 05/27/2015   CO2 27 05/27/2015   TSH 1.146 12/17/2013   INR 1.03 08/28/2013   HGBA1C 5.9 07/15/2018    ASSESSMENT AND PLAN:  Discussed the following assessment and plan:    ICD-10-CM   1. Herpes zoster without  complication probable  W62.0   2. Rash of neck  R21   3. Multiple allergies  Z88.9    including beef and pork  4. Recent bereavement  Z63.4    father   Neck rash hightly supcious  Begin valtrex course asap Ankle rash may pre date  Above and was present when had allergy testing  Counseled.   Expectant management and discussion of plan and treatment with opportunity to ask questions and all were answered. The patient agreed with the plan and demonstrated an understanding of the instructions.  review  Visit counsel plan   30 minutes  Advised to call back or seek an in-person evaluation if worsening  or having  further concerns . Return if symptoms worsen or fail to improve as exoected.   [name], MD

## 2020-03-21 ENCOUNTER — Telehealth: Payer: Self-pay | Admitting: Internal Medicine

## 2020-03-21 NOTE — Telephone Encounter (Signed)
Spoke with pt who is complaining of bilateral ankle swelling x several days.  Pt also reports some mild intermittent SOB but is not sure if that might be related to her asthma.  Pt is taking medications as prescribed.  She states she is not on a diuretic due to her history of dysautonomia.  Pt has been elevating her feet and legs as much as possible during the day. Pt also c/o of possible Sciatica pain.  Pt encouraged to contact her PCP for further evaluation.   Appointment scheduled for 04/05/2020 with Tommye Standard.  Pt advised to elevate feet and legs above heart level when possible.  Advised if SOB worsens or she develops CP please seek care in the ED.  Pt verbalizes understanding and agrees with current plan.

## 2020-03-21 NOTE — Telephone Encounter (Signed)
Pt c/o swelling: STAT is pt has developed SOB within 24 hours  1) How much weight have you gained and in what time span? No weight gained  2) If swelling, where is the swelling located? Ankles- aching and hurting  3) Are you currently taking a fluid pill? no  4) Are you currently SOB? Not at this time off and on  5) Do you have a log of your daily weights (if so, list)?   6) Have you gained 3 pounds in a day or 5 pounds in a week? no  7) Have you traveled recently?  No- pt wants to be seen in person asap please

## 2020-03-22 ENCOUNTER — Other Ambulatory Visit: Payer: Self-pay

## 2020-03-22 ENCOUNTER — Ambulatory Visit (INDEPENDENT_AMBULATORY_CARE_PROVIDER_SITE_OTHER): Payer: Medicare Other

## 2020-03-22 DIAGNOSIS — Z0000 Encounter for general adult medical examination without abnormal findings: Secondary | ICD-10-CM

## 2020-03-22 NOTE — Patient Instructions (Addendum)
[name] , Thank you for taking time to come for your Medicare Wellness Visit. I appreciate your ongoing commitment to your health goals. Please review the following plan we discussed and let me know if I can assist you in the future.   Screening recommendations/referrals: Colonoscopy: Done 12/06/16 Mammogram: Pt will make appt another time  Recommended yearly ophthalmology/optometry visit for glaucoma screening and checkup Recommended yearly dental visit for hygiene and checkup  Vaccinations: Influenza vaccine: Declined Pneumococcal vaccine: Declined  Tdap vaccine: Due and discussed  Covid-19: Medically advised against  Advanced directives: Please bring a copy of your health care power of attorney and living will to the office at your convenience.  Conditions/risks identified: Cancer free  Next appointment: Follow up in one year for your annual wellness visit.   Preventive Care 40-64 Years, Female Preventive care refers to lifestyle choices and visits with your health care provider that can promote health and wellness. What does preventive care include?  A yearly physical exam. This is also called an annual well check.  Dental exams once or twice a year.  Routine eye exams. Ask your health care provider how often you should have your eyes checked.  Personal lifestyle choices, including:  Daily care of your teeth and gums.  Regular physical activity.  Eating a healthy diet.  Avoiding tobacco and drug use.  Limiting alcohol use.  Practicing safe sex.  Taking low-dose aspirin daily starting at age 70.  Taking vitamin and mineral supplements as recommended by your health care provider. What happens during an annual well check? The services and screenings done by your health care provider during your annual well check will depend on your age, overall health, lifestyle risk factors, and family history of disease. Counseling  Your health care provider may ask you  questions about your:  Alcohol use.  Tobacco use.  Drug use.  Emotional well-being.  Home and relationship well-being.  Sexual activity.  Eating habits.  Work and work Statistician.  Method of birth control.  Menstrual cycle.  Pregnancy history. Screening  You may have the following tests or measurements:  Height, weight, and BMI.  Blood pressure.  Lipid and cholesterol levels. These may be checked every 5 years, or more frequently if you are over 52 years old.  Skin check.  Lung cancer screening. You may have this screening every year starting at age 53 if you have a 30-pack-year history of smoking and currently smoke or have quit within the past 15 years.  Fecal occult blood test (FOBT) of the stool. You may have this test every year starting at age 34.  Flexible sigmoidoscopy or colonoscopy. You may have a sigmoidoscopy every 5 years or a colonoscopy every 10 years starting at age 48.  Hepatitis C blood test.  Hepatitis B blood test.  Sexually transmitted disease (STD) testing.  Diabetes screening. This is done by checking your blood sugar (glucose) after you have not eaten for a while (fasting). You may have this done every 1-3 years.  Mammogram. This may be done every 1-2 years. Talk to your health care provider about when you should start having regular mammograms. This may depend on whether you have a family history of breast cancer.  BRCA-related cancer screening. This may be done if you have a family history of breast, ovarian, tubal, or peritoneal cancers.  Pelvic exam and Pap test. This may be done every 3 years starting at age 31. Starting at age 42, this may be done every 5  years if you have a Pap test in combination with an HPV test.  Bone density scan. This is done to screen for osteoporosis. You may have this scan if you are at high risk for osteoporosis. Discuss your test results, treatment options, and if necessary, the need for more tests with  your health care provider. Vaccines  Your health care provider may recommend certain vaccines, such as:  Influenza vaccine. This is recommended every year.  Tetanus, diphtheria, and acellular pertussis (Tdap, Td) vaccine. You may need a Td booster every 10 years.  Zoster vaccine. You may need this after age 38.  Pneumococcal 13-valent conjugate (PCV13) vaccine. You may need this if you have certain conditions and were not previously vaccinated.  Pneumococcal polysaccharide (PPSV23) vaccine. You may need one or two doses if you smoke cigarettes or if you have certain conditions. Talk to your health care provider about which screenings and vaccines you need and how often you need them. This information is not intended to replace advice given to you by your health care provider. Make sure you discuss any questions you have with your health care provider. Document Released: 03/11/2015 Document Revised: 11/02/2015 Document Reviewed: 12/14/2014 Elsevier Interactive Patient Education  2017 [place] Prevention in the Home Falls can cause injuries. They can happen to people of all ages. There are many things you can do to make your home safe and to help prevent falls. What can I do on the outside of my home?  Regularly fix the edges of walkways and driveways and fix any cracks.  Remove anything that might make you trip as you walk through a door, such as a raised step or threshold.  Trim any bushes or trees on the path to your home.  Use bright outdoor lighting.  Clear any walking paths of anything that might make someone trip, such as rocks or tools.  Regularly check to see if handrails are loose or broken. Make sure that both sides of any steps have handrails.  Any raised decks and porches should have guardrails on the edges.  Have any leaves, snow, or ice cleared regularly.  Use sand or salt on walking paths during winter.  Clean up any spills in your garage right  away. This includes oil or grease spills. What can I do in the bathroom?  Use night lights.  Install grab bars by the toilet and in the tub and shower. Do not use towel bars as grab bars.  Use non-skid mats or decals in the tub or shower.  If you need to sit down in the shower, use a plastic, non-slip stool.  Keep the floor dry. Clean up any water that spills on the floor as soon as it happens.  Remove soap buildup in the tub or shower regularly.  Attach bath mats securely with double-sided non-slip rug tape.  Do not have throw rugs and other things on the floor that can make you trip. What can I do in the bedroom?  Use night lights.  Make sure that you have a light by your bed that is easy to reach.  Do not use any sheets or blankets that are too big for your bed. They should not hang down onto the floor.  Have a firm chair that has side arms. You can use this for support while you get dressed.  Do not have throw rugs and other things on the floor that can make you trip. What can I do  in the kitchen?  Clean up any spills right away.  Avoid walking on wet floors.  Keep items that you use a lot in easy-to-reach places.  If you need to reach something above you, use a strong step stool that has a grab bar.  Keep electrical cords out of the way.  Do not use floor polish or wax that makes floors slippery. If you must use wax, use non-skid floor wax.  Do not have throw rugs and other things on the floor that can make you trip. What can I do with my stairs?  Do not leave any items on the stairs.  Make sure that there are handrails on both sides of the stairs and use them. Fix handrails that are broken or loose. Make sure that handrails are as long as the stairways.  Check any carpeting to make sure that it is firmly attached to the stairs. Fix any carpet that is loose or worn.  Avoid having throw rugs at the top or bottom of the stairs. If you do have throw rugs, attach  them to the floor with carpet tape.  Make sure that you have a light switch at the top of the stairs and the bottom of the stairs. If you do not have them, ask someone to add them for you. What else can I do to help prevent falls?  Wear shoes that:  Do not have high heels.  Have rubber bottoms.  Are comfortable and fit you well.  Are closed at the toe. Do not wear sandals.  If you use a stepladder:  Make sure that it is fully opened. Do not climb a closed stepladder.  Make sure that both sides of the stepladder are locked into place.  Ask someone to hold it for you, if possible.  Clearly mark and make sure that you can see:  Any grab bars or handrails.  First and last steps.  Where the edge of each step is.  Use tools that help you move around (mobility aids) if they are needed. These include:  Canes.  Walkers.  Scooters.  Crutches.  Turn on the lights when you go into a dark area. Replace any light bulbs as soon as they burn out.  Set up your furniture so you have a clear path. Avoid moving your furniture around.  If any of your floors are uneven, fix them.  If there are any pets around you, be aware of where they are.  Review your medicines with your doctor. Some medicines can make you feel dizzy. This can increase your chance of falling. Ask your doctor what other things that you can do to help prevent falls. This information is not intended to replace advice given to you by your health care provider. Make sure you discuss any questions you have with your health care provider. Document Released: 12/09/2008 Document Revised: 07/21/2015 Document Reviewed: 03/19/2014 Elsevier Interactive Patient Education  2017 [employer].

## 2020-03-22 NOTE — Progress Notes (Signed)
Virtual Visit via Telephone Note  I connected with  [name] on 03/22/20 at 11:00 AM EST by telephone and verified that I am speaking with the correct person using two identifiers.  Location: Patient: Home Provider: Office Persons participating in the virtual visit: patient/Nurse Health Advisor   I discussed the limitations, risks, security and privacy concerns of performing an evaluation and management service by telephone and the availability of in person appointments. The patient expressed understanding and agreed to proceed.  Interactive audio and video telecommunications were attempted between this nurse and patient, however failed, due to patient having technical difficulties OR patient did not have access to video capability.  We continued and completed visit with audio only.  Some vital signs may be absent or patient reported.   Willette Brace, LPN    Subjective:   [name] is a 65 y.o. female who presents for an Initial Medicare Annual Wellness Visit.  Review of Systems           Objective:    Today's Vitals   03/22/20 1115  PainSc: 8    There is no height or weight on file to calculate BMI.  Advanced Directives 03/22/2020 03/28/2018 12/25/2017 02/27/2017 11/12/2016 11/23/2015 11/21/2015  Does Patient Have a Medical Advance Directive? Yes Yes Yes Yes Yes Yes Yes  Type of Paramedic of [place];Living will [place];Living will [place];Living will Living will [place];Living will [place];Living will [place];Living will  Does patient want to make changes to medical advance directive? - No - Patient declined No - Patient declined No - Patient declined - - -  Copy of [place] in Chart? No - copy requested No - copy requested No - copy requested No - copy requested No - copy requested - No - copy requested   Pre-existing out of facility DNR order (yellow form or pink MOST form) - - - - - - -    Current Medications (verified) Outpatient Encounter Medications as of 03/22/2020  Medication Sig  . acetaminophen (TYLENOL) 500 MG tablet Take 500 mg by mouth at bedtime.  Marland Kitchen albuterol (PROVENTIL) (2.5 MG/3ML) 0.083% nebulizer solution Take 3 mLs (2.5 mg total) by nebulization every 4 (four) hours as needed for wheezing or shortness of breath.  Marland Kitchen albuterol (VENTOLIN HFA) 108 (90 Base) MCG/ACT inhaler INHALE 2 PUFFS INTO THE LUNGS EVERY 4 (FOUR) HOURS AS NEEDED FOR WHEEZING OR SHORTNESS OF BREATH.  Marland Kitchen augmented betamethasone dipropionate (DIPROLENE) 0.05 % ointment Apply topically 2 (two) times daily. Limit to no longer than 2 weeks use. Not on face.  . bimatoprost (LATISSE) 0.03 % ophthalmic solution APPLY 1 DROP IN BOTH EYES ONCE A DAY  . budesonide-formoterol (SYMBICORT) 160-4.5 MCG/ACT inhaler Inhale 2 puffs into the lungs 2 (two) times daily. Use with spacer.  . calcium carbonate (TUMS - DOSED IN MG ELEMENTAL CALCIUM) 500 MG chewable tablet Chew 1 tablet by mouth daily as needed for indigestion or heartburn.   Marland Kitchen CALCIUM PO Take 1 tablet by mouth daily.  . cholestyramine (QUESTRAN) 4 g packet 1 packet mixed with water or non-carbonated drink  . clobetasol cream (TEMOVATE) 0.05 %   . clonazePAM (KLONOPIN) 1 MG tablet Take 1 mg by mouth 2 (two) times daily.   . cloNIDine (CATAPRES) 0.1 MG tablet Take 1 tablet (0.1 mg total) by mouth at bedtime.  . cyanocobalamin (,VITAMIN B-12,) 1000 MCG/ML injection Inject  1 mL (1,000 mcg total) into the muscle every 30 (thirty) days.  . Desoximetasone (TOPICORT) 0.25 % ointment Apply 1 application topically 2 (two) times daily.  . diphenhydrAMINE (BENADRYL) 25 MG tablet Take 25 mg by mouth at bedtime as needed for allergies or sleep.   Marland Kitchen EPINEPHrine 0.3 mg/0.3 mL IJ SOAJ injection SMARTSIG:0.3 Milliliter(s) IM Once PRN  . fluorometholone (FML) 0.1 % ophthalmic suspension  Place 1 drop into both eyes 4 (four) times daily as needed (itching).   . fluorouracil (EFUDEX) 5 % cream 1 application  . fluticasone (FLOVENT HFA) 44 MCG/ACT inhaler 2 puffs  . fluticasone furoate-vilanterol (BREO ELLIPTA) 100-25 MCG/INH AEPB Inhale 1 puff into the lungs daily. For wheezing suppression  . hydrocortisone (ANUSOL-HC) 25 MG suppository Place 1 suppository (25 mg total) rectally at bedtime as needed for hemorrhoids.  . hydrocortisone 2.5 % cream Apply 1 application topically 2 (two) times daily as needed (irritation).  . hyoscyamine (LEVSIN SL) 0.125 MG SL tablet Place under the tongue.  Marland Kitchen ibuprofen (ADVIL,MOTRIN) 200 MG tablet Take 200-400 mg by mouth 3 (three) times daily as needed for moderate pain.   Marland Kitchen levETIRAcetam (KEPPRA) 250 MG tablet Take 250 mg by mouth 3 (three) times daily. 6 am, 12 pm, 6 pm  . levocetirizine (XYZAL) 5 MG tablet TAKE 1 TABLET BY MOUTH EVERY DAY AS NEEDED  . lipase/protease/amylase (CREON) 12000 units CPEP capsule Take 12,000 Units by mouth daily with breakfast.  . loratadine (CLARITIN) 10 MG tablet Take 10 mg by mouth daily.  Lita Mains (ZYLET) 0.5-0.3 % SUSP Administer 1 drop to the right eye daily as needed.  . metoprolol tartrate (LOPRESSOR) 25 MG tablet TAKE 1 TABLET (25 MG TOTAL) BY MOUTH 2 (TWO) TIMES DAILY.  . montelukast (SINGULAIR) 10 MG tablet Take 1 tablet (10 mg total) by mouth at bedtime.  Marland Kitchen nystatin (MYCOSTATIN) 100000 UNIT/ML suspension Use as directed 5 mLs in the mouth or throat 3 (three) times daily as needed (thrush).  . Olopatadine HCl (PATADAY) 0.2 % SOLN Use one drop in each eye once daily as needed.  . polyethylene glycol (MIRALAX / GLYCOLAX) packet Take 17 g by mouth at bedtime.  . potassium chloride SA (K-DUR,KLOR-CON) 20 MEQ tablet Take 1 tablet (20 mEq total) by mouth 2 (two) times daily.  . prednisoLONE acetate (PRED FORTE) 1 % ophthalmic suspension SMARTSIG:In Eye(s)  . sodium chloride (OCEAN) 0.65 % SOLN nasal  spray Place 1 spray into both nostrils as needed for congestion.   Marland Kitchen Spacer/Aero-Holding Chambers ([place]) MISC See admin instructions.  . Syringe/Needle, Disp, (SYRINGE 3CC/27GX1-1/4") 27G X 1-1/4" 3 ML MISC Inject 1 mL into the muscle every 30 (thirty) days.  . valACYclovir (VALTREX) 1000 MG tablet Take 1 tablet (1,000 mg total) by mouth 3 (three) times daily. For shingles  . Vitamin D, Ergocalciferol, (DRISDOL) 50000 units CAPS capsule TAKE 1 CAPSULE (50,000 UNITS TOTAL) BY MOUTH EVERY 7 (SEVEN) DAYS. (Patient taking differently: Take 50,000 Units by mouth See admin instructions. Take 50000 units by mouth every Mon, Wed, and Sat)  . [DISCONTINUED] Azelastine HCl 0.15 % SOLN Use 1-2 sprays in each nostril 2 times daily as needed. (Patient not taking: Reported on 03/15/2020)  . [DISCONTINUED] Cyanocobalamin ([facility] VITAMIN B12) 1000 MCG TBCR Place 1 tablet (1,000 mcg total) under the tongue daily. (Patient not taking: Reported on 03/22/2020)  . [DISCONTINUED] fluorometholone (FML) 0.1 % ophthalmic suspension Administer 1 drop to both eyes 4 (four) times a day as needed.  . [DISCONTINUED]  fluticasone (FLONASE) 50 MCG/ACT nasal spray Place 2 sprays into both nostrils daily. (Patient not taking: No sig reported)   No facility-administered encounter medications on file as of 03/22/2020.    Allergies (verified) Bee venom, Doxycycline, Erythromycin, Gadavist [gadobutrol], Iodinated diagnostic agents, Other, Penicillins, Sulfa antibiotics, Preparation h [pramox-pe-glycerin-petrolatum], Sulfonamide derivatives, and Phenergan [promethazine hcl]   History: Past Medical History:  Diagnosis Date  . Angio-edema   . Arthritis   . Asthma   . Atrial fibrillation ([place])   . Atrial tachycardia ([place])   . Cervicalgia   . Complication of anesthesia    pt states wakes up with "shakes"  . CVA (cerebral infarction)    2012 with dizziness and vision change felt embolic from atrial tachy  . Disorder of  bone and cartilage, unspecified   . Disturbance of skin sensation   . Diverticulosis   . DM (diabetes mellitus) ([place])   . DVT (deep venous thrombosis) ([place])   . DVT (deep venous thrombosis) ([place])   . Eczema   . Family history of anesthesia complication    PONV  . Fatty liver   . GERD (gastroesophageal reflux disease)   . History of hiatal hernia   . History of pacemaker   . History of radiation therapy 10/24/12   brain  . HLD (hyperlipidemia)   . HTN (hypertension)   . Hypoglycemia, unspecified   . 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 Liver metastasis ([place])   . Lung metastasis ([place])   . Metastasis to brain ([place])   . Osteopenia   . Other nonspecific abnormal serum enzyme levels   . Other specified congenital anomalies of nervous system   . Pacemaker    autonomic dysfunction  . Pancreatitis    from therapy  . Peripheral vascular disease ([place])   . Pneumonia   . PONV (postoperative nausea and vomiting)   . POTS (postural orthostatic tachycardia syndrome)   . Recurrent upper respiratory infection (URI)   . Skin cancer    Hx: of lung lesion  . Stroke [facility])    left weaker   . Swelling of limb   . Urticaria    Past Surgical History:  Procedure Laterality Date  . ABLATION SAPHENOUS VEIN W/ RFA     2002  . ADENOIDECTOMY    . CARPAL TUNNEL RELEASE Left   . CHOLECYSTECTOMY  03/28/2018  . CHOLECYSTECTOMY N/A 03/28/2018   Procedure: LAPAROSCOPIC CHOLECYSTECTOMY WITH INTRAOPERATIVE CHOLANGIOGRAM;  Surgeon: Johnathan Hausen, MD;  Location: [place];  Service: General;  Laterality: N/A;  . CRANIOTOMY N/A 09/30/2012   suboccipital craniectomy  . CRANIOTOMY Left 02/09/2013   Procedure: LEFT PARIETAL CRANIOTOMY with stealth;  Surgeon: [name], MD;  Location: [place] [facility];  Service: Neurosurgery;  Laterality: Left;  LEFT Parietal Craniotomy for tumor with stealth  . DEEP AXILLARY SENTINEL NODE BIOPSY / EXCISION     due to extensive Melanoma-right arm  .  fatty tumor removed     from chest  . INSERT / REPLACE / REMOVE PACEMAKER  2012   1999, x 3  . LAPAROSCOPY  09/06/2011   Procedure: LAPAROSCOPY OPERATIVE;  Surgeon: Claiborne Billings A. Pamala Hurry, MD;  Location: [place] [facility];  Service: Gynecology;  Laterality: Left;  with Left Ovarian Cystectomy   . MELANOMA EXCISION     with removal of lymph nodes, left shoulder  . NOSE SURGERY  2010, 2012   for nose bleeds x 2  . PACEMAKER IMPLANT    . SINOSCOPY    .  TONSILLECTOMY AND ADENOIDECTOMY     Family History  Problem Relation Age of Onset  . Allergic rhinitis Sister   . Stroke Mother   . Colon polyps Mother   . Colon cancer Mother   . Urticaria Mother   . Allergic rhinitis Mother   . Heart disease Father   . Diabetes Father   . Kidney disease Father   . Diabetes Maternal Grandmother   . Clotting disorder Maternal Grandmother        stroke  . Crohn's disease Maternal Grandmother   . Diabetes Paternal Grandmother   . Aneurysm Sister        brain   Social History   Socioeconomic History  . Marital status: Married    Spouse name: Not on file  . Number of children: 0  . Years of education: Not on file  . Highest education level: Not on file  Occupational History  . Occupation: PRESIDENT    Employer: [employer]    Comment: self employed  Tobacco Use  . Smoking status: Never Smoker  . Smokeless tobacco: Never Used  Vaping Use  . Vaping Use: Never used  Substance and Sexual Activity  . Alcohol use: Not Currently    Comment: glass of wine daily  . Drug use: No  . Sexual activity: Not on file  Other Topics Concern  . Not on file  Social History Narrative   4 years college hh of 2    Neg tad   Social Determinants of Health   Financial Resource Strain: Low Risk   . Difficulty of Paying Living Expenses: Not hard at all  Food Insecurity: No Food Insecurity  . Worried About Charity fundraiser in the Last Year: Never true  . Ran Out of Food in the Last Year: Never true   Transportation Needs: No Transportation Needs  . Lack of Transportation (Medical): No  . Lack of Transportation (Non-Medical): No  Physical Activity: Sufficiently Active  . Days of Exercise per Week: 5 days  . Minutes of Exercise per Session: 30 min  Stress: Stress Concern Present  . Feeling of Stress : To some extent  Social Connections: Unknown  . Frequency of Communication with Friends and Family: More than three times a week  . Frequency of Social Gatherings with Friends and Family: Once a week  . Attends Religious Services: Not on file  . Active Member of Clubs or Organizations: No  . Attends Archivist Meetings: Never  . Marital Status: Married    Tobacco Counseling Counseling given: Not Answered   Clinical Intake:  Pre-visit preparation completed: Yes  Pain : 0-10 Pain Score: 8  Pain Type: Chronic pain Pain Location: Neck (right buttocks) Pain Orientation: Right Pain Descriptors / Indicators: Numbness,Tingling,Aching,Pins and needles Pain Onset: 1 to 4 weeks ago Pain Frequency: Constant     BMI - recorded: 27.01 Nutritional Status: BMI 25 -29 Overweight Nutritional Risks: None Diabetes: No  How often do you need to have someone help you when you read instructions, pamphlets, or other written materials from your doctor or pharmacy?: 1 - Never  Diabetic?No  Interpreter Needed?: No  Information entered by :: [name], LPN   Activities of Daily Living In your present state of health, do you have any difficulty performing the following activities: 03/22/2020  Hearing? N  Vision? N  Difficulty concentrating or making decisions? N  Walking or climbing stairs? N  Dressing or bathing? N  Doing errands, shopping? N  Preparing Food and eating ? N  Using the Toilet? N  In the past six months, have you accidently leaked urine? N  Do you have problems with loss of bowel control? N  Managing your Medications? N  Managing your Finances? N   Housekeeping or managing your Housekeeping? N  Some recent data might be hidden    Patient Care Team: Panosh, Standley Brooking, MD as PCP - Lisa Roca, MD as Consulting Physician (Neurosurgery) [name], MD as Consulting Physician (Oncology) [name], MD as Referring Physician (Internal Medicine) Jodi Marble, MD as Consulting Physician (Otolaryngology) Danella Sensing, MD as Consulting Physician (Dermatology) Viona Gilmore, [facility] as Pharmacist (Pharmacist)  Indicate any recent Medical Services you may have received from other than [facility] providers in the past year (date may be approximate).     Assessment:   This is a routine wellness examination for [name].  Hearing/Vision screen  Hearing Screening   125Hz  250Hz  500Hz  1000Hz  2000Hz  3000Hz  4000Hz  6000Hz  8000Hz   Right ear:           Left ear:           Comments: Pt denies any hearing   Vision Screening Comments: Pt follows up with Dr Willow Ora for annual eye exams  Dietary issues and exercise activities discussed: Current Exercise Habits: Home exercise routine, Type of exercise: walking, Time (Minutes): 30, Frequency (Times/Week): 5, Weekly Exercise (Minutes/Week): 150  Goals    . Patient Stated     Be cancer free    . Pharmacy Care Plan     CARE PLAN ENTRY  Current Barriers:  . Chronic Disease Management support, education, and care coordination needs related to Hypertension, Asthma, and allergic rhinitis    Hypertension . Pharmacist Clinical Goal(s): o Over the next 180 days, patient will work with PharmD and providers to maintain BP goal <140/90 . Current regimen:   Clonidine 0.1mg , 1 tablet at bedtime  Metoprolol tartrate 25mg , 1 tablet twice daily . Interventions: o We discussed taking precaution standing up and taking time to take first step  . Patient self care activities - Over the next 180 days, patient will: o Check blood pressure daily, document, and provide at future  appointments o Patient will discuss with Dr. Caryl Comes about switching blood pressure medications to avoid additive dizziness/lightheadedness from current medications.  Asthma . Pharmacist Clinical Goal(s) o Over the next 180 days, patient will work with PharmD and providers to maintain breathing controlled.  . Current regimen:   Albuterol 0.083% nebulizer solution, 3 MLs every four hours as needed for wheezing or shortness of breath  Albuterol HFA 188mcg/act inhaler, 2 puffs every hours as needed for wheezing or shortness of breath  Budesonide/ formorterol (Symbicort) 160-4.43mcg/ act inhaler, 2 puffs twice daily  Fluticasone/ vilanterol (Breo Ellipta), 1 puff once daily  . Interventions: o We discussed duplication: duplication with budesonide/ formorterol (Symbicort) and fluticasone/ vlanterol (Breo Ellipta) . Patient self care activities o Patient will continue current medications as prescribed.   Allergic rhinitis  . Pharmacist Clinical Goal(s) o Over the next 180 days, patient will work with PharmD and providers to minimize allergy and rash symptoms . Current regimen:   levocetirizine (Xyzal) 5mg , 1 tablet everday as needed . Patient self care activities - patient will: o Continue current medications as directed by providers.   Vitamin B12 deficiency  . Pharmacist Clinical Goal(s) o Over the next 180 days, patient will work with PharmD and providers to minimize allergy and rash symptoms .  Current regimen:   Vitamin B12 1000 mcg 1 tablet daily . Interventions: o We discussed scheduling lab appointment to get vitamin B12 and vitamin D rechecked . Patient self care activities o Patient will continue current medications as prescribed.   Medication management . Pharmacist Clinical Goal(s): o Over the next 180 days, patient will work with PharmD and providers to maintain optimal medication adherence . [facility]  . Interventions o Comprehensive medication review  performed. o Continue current medication management strategy o Consulting with Dr. Regis Bill on obtaining vitamin B12, lipid panel, and inflammatory markers.  . Patient self care activities - Over the next 180 days, patient will: o Take medications as prescribed o Report any questions or concerns to PharmD and/or provider(s)  Please see past updates related to this goal by clicking on the "Past Updates" button in the selected goal        Depression Screen PHQ 2/9 Scores 03/22/2020 03/15/2020  PHQ - 2 Score 0 0    Fall Risk Fall Risk  03/22/2020 03/02/2015 05/10/2014 03/17/2014 12/17/2013  Falls in the past year? - No No Yes No  Comment - - - - -  Number falls in past yr: 0 - - 1 -  Injury with Fall? 0 - - No -  Comment - - - - -  Risk for fall due to : Impaired balance/gait;Impaired mobility;Impaired vision - - Impaired balance/gait -    FALL RISK PREVENTION PERTAINING TO THE HOME:  Any stairs in or around the home? Yes  If so, are there any without handrails? No  Home free of loose throw rugs in walkways, pet beds, electrical cords, etc? Yes  Adequate lighting in your home to reduce risk of falls? Yes   ASSISTIVE DEVICES UTILIZED TO PREVENT FALLS:  Life alert? Yes  Use of a cane, walker or w/c? Yes  Grab bars in the bathroom? No  Shower chair or bench in shower? Yes  Elevated toilet seat or a handicapped toilet? Yes   TIMED UP AND GO:  Was the test performed? No .      Cognitive Function:     6CIT Screen 03/22/2020  What Year? 0 points  What month? 0 points  Count back from 20 0 points  Months in reverse 0 points  Repeat phrase 10 points    Immunizations Immunization History  Administered Date(s) Administered  . Influenza Split 03/03/2012  . PFIZER(Purple Top)SARS-COV-2 Vaccination 05/01/2019  . PPD Test 07/01/2013  . Pneumococcal Polysaccharide-23 10/03/2012    TDAP status: Due, Education has been provided regarding the importance of this vaccine. Advised may  receive this vaccine at local pharmacy or Health Dept. Aware to provide a copy of the vaccination record if obtained from local pharmacy or Health Dept. Verbalized acceptance and understanding.  Flu Vaccine status: Declined, Education has been provided regarding the importance of this vaccine but patient still declined. Advised may receive this vaccine at local pharmacy or Health Dept. Aware to provide a copy of the vaccination record if obtained from local pharmacy or Health Dept. Verbalized acceptance and understanding.  Pneumococcal vaccine status: Declined,  Education has been provided regarding the importance of this vaccine but patient still declined. Advised may receive this vaccine at local pharmacy or Health Dept. Aware to provide a copy of the vaccination record if obtained from local pharmacy or Health Dept. Verbalized acceptance and understanding.   Covid-19 vaccine status: Declined, Education has been provided regarding the importance of this vaccine but patient still  declined. Advised may receive this vaccine at local pharmacy or Health Dept.or vaccine clinic. Aware to provide a copy of the vaccination record if obtained from local pharmacy or Health Dept. Verbalized acceptance and understanding.  Qualifies for Shingles Vaccine? No   Zostavax completed No     Screening Tests Health Maintenance  Topic Date Due  . Hepatitis C Screening  Never done  . HIV Screening  Never done  . TETANUS/TDAP  Never done  . PAP SMEAR-Modifier  03/22/2020 (Originally [birth year]/30/1978)  . INFLUENZA VACCINE  05/26/2020 (Originally 09/27/2019)  . MAMMOGRAM  03/22/2021 (Originally 11/26/2010)  . COLONOSCOPY (Pts 45-62yrs Insurance coverage will need to be confirmed)  12/07/2026  . COVID-19 Vaccine  Discontinued    Health Maintenance  Health Maintenance Due  Topic Date Due  . Hepatitis C Screening  Never done  . HIV Screening  Never done  . TETANUS/TDAP  Never done    Colorectal cancer screening: Type of  screening: Colonoscopy. Completed 12/06/16. Repeat every 10 years  Mammogram status: No longer required due to patient had other health issues at the time.   Additional Screening:  Hepatitis C Screening: does qualify;  Vision Screening: Recommended annual ophthalmology exams for early detection of glaucoma and other disorders of the eye. Is the patient up to date with their annual eye exam?  Yes  Who is the provider or what is the name of the office in which the patient attends annual eye exams? *Dr Willow Ora  Dental Screening: Recommended annual dental exams for proper oral hygiene  Community Resource Referral / Chronic Care Management: CRR required this visit?  No   CCM required this visit?  No      Plan:     I have personally reviewed and noted the following in the patient's chart:   . Medical and social history . Use of alcohol, tobacco or illicit drugs  . Current medications and supplements . Functional ability and status . Nutritional status . Physical activity . Advanced directives . List of other physicians . Hospitalizations, surgeries, and ER visits in previous 12 months . Vitals . Screenings to include cognitive, depression, and falls . Referrals and appointments  In addition, I have reviewed and discussed with patient certain preventive protocols, quality metrics, and best practice recommendations. A written personalized care plan for preventive services as well as general preventive health recommendations were provided to patient.     Willette Brace, LPN   11/17/1937   Nurse Notes: Pt stated that she is having difficulty managing pain from shingles please advise

## 2020-04-03 NOTE — Progress Notes (Deleted)
Cardiology Office Note Date:  04/03/2020  Patient ID:  [name], DOB [birth year], MRN [id number] PCP:  Burnis Medin, MD  Cardiologist/Electrophysiologist: Dr. Caryl Comes  ***refresh   Chief Complaint: *** LE swelling, SOB  History of Present Illness: [name] is a 65 y.o. female with history of metastatic melanoma (liver, lung, brain, h/o cranial radiation), stroke, DM, DVT, sinus node dysfunction in setting of dysautonomia > PPM, AFib (?), ATach, pancreatic insufficiency.  She comes today to be seen for Dr. Caryl Comes, last seen by him Jan 2020, 2 years ago.  At that time cleared for cholecystectomy.  Discussed insignificant ATach.  Continue hydration. I do not see any discussion of AFib (mentioned on her problem list only)  Telephone notes 03/21/20, with c/o LE swelling and SOB. She had a tele health visit 03/22/20 with primary care, described as a well visit, I do not see any discussion or addressing of c/o edema or SOB.  *** edema?  SOB? *** meds *** volume *** ATach burden *** every on a/c? *** labs, lipids *** NO REMOTES, last device check 2020 *** last echo 2014    Device information Biotronik dual chamber PPM, leads are from [place] (RV is a medtronic lead, RA is a pacesetter lead), last gen change 1012   Past Medical History:  Diagnosis Date  . Angio-edema   . Arthritis   . Asthma   . Atrial fibrillation ([place])   . Atrial tachycardia ([place])   . Cervicalgia   . Complication of anesthesia    pt states wakes up with "shakes"  . CVA (cerebral infarction)    2012 with dizziness and vision change felt embolic from atrial tachy  . Disorder of bone and cartilage, unspecified   . Disturbance of skin sensation   . Diverticulosis   . DM (diabetes mellitus) ([place])   . DVT (deep venous thrombosis) ([place])   . DVT (deep venous thrombosis) ([place])   . Eczema   . Family history of anesthesia complication    PONV  . Fatty liver   . GERD (gastroesophageal reflux disease)    . History of hiatal hernia   . History of pacemaker   . History of radiation therapy 10/24/12   brain  . HLD (hyperlipidemia)   . HTN (hypertension)   . Hypoglycemia, unspecified   . 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 Liver metastasis ([place])   . Lung metastasis ([place])   . Metastasis to brain ([place])   . Osteopenia   . Other nonspecific abnormal serum enzyme levels   . Other specified congenital anomalies of nervous system   . Pacemaker    autonomic dysfunction  . Pancreatitis    from therapy  . Peripheral vascular disease ([place])   . Pneumonia   . PONV (postoperative nausea and vomiting)   . POTS (postural orthostatic tachycardia syndrome)   . Recurrent upper respiratory infection (URI)   . Skin cancer    Hx: of lung lesion  . Stroke [facility])    left weaker   . Swelling of limb   . Urticaria     Past Surgical History:  Procedure Laterality Date  . ABLATION SAPHENOUS VEIN W/ RFA     2002  . ADENOIDECTOMY    . CARPAL TUNNEL RELEASE Left   . CHOLECYSTECTOMY  03/28/2018  . CHOLECYSTECTOMY N/A 03/28/2018   Procedure: LAPAROSCOPIC CHOLECYSTECTOMY WITH INTRAOPERATIVE CHOLANGIOGRAM;  Surgeon: Johnathan Hausen, MD;  Location: [facility];  Service: General;  Laterality: N/A;  . CRANIOTOMY N/A 09/30/2012   suboccipital craniectomy  . CRANIOTOMY Left 02/09/2013   Procedure: LEFT PARIETAL CRANIOTOMY with stealth;  Surgeon: [name], MD;  Location: [facility];  Service: Neurosurgery;  Laterality: Left;  LEFT Parietal Craniotomy for tumor with stealth  . DEEP AXILLARY SENTINEL NODE BIOPSY / EXCISION     due to extensive Melanoma-right arm  . fatty tumor removed     from chest  . INSERT / REPLACE / REMOVE PACEMAKER  2012   1999, x 3  . LAPAROSCOPY  09/06/2011   Procedure: LAPAROSCOPY OPERATIVE;  Surgeon: Claiborne Billings A. Pamala Hurry, MD;  Location: [facility];  Service: Gynecology;  Laterality: Left;  with Left Ovarian Cystectomy   . MELANOMA EXCISION     with removal of  lymph nodes, left shoulder  . NOSE SURGERY  2010, 2012   for nose bleeds x 2  . PACEMAKER IMPLANT    . SINOSCOPY    . TONSILLECTOMY AND ADENOIDECTOMY      Current Outpatient Medications  Medication Sig Dispense Refill  . acetaminophen (TYLENOL) 500 MG tablet Take 500 mg by mouth at bedtime.    Marland Kitchen albuterol (PROVENTIL) (2.5 MG/3ML) 0.083% nebulizer solution Take 3 mLs (2.5 mg total) by nebulization every 4 (four) hours as needed for wheezing or shortness of breath. 180 mL 1  . albuterol (VENTOLIN HFA) 108 (90 Base) MCG/ACT inhaler INHALE 2 PUFFS INTO THE LUNGS EVERY 4 (FOUR) HOURS AS NEEDED FOR WHEEZING OR SHORTNESS OF BREATH. 18 Inhaler 0  . augmented betamethasone dipropionate (DIPROLENE) 0.05 % ointment Apply topically 2 (two) times daily. Limit to no longer than 2 weeks use. Not on face. 50 g 0  . bimatoprost (LATISSE) 0.03 % ophthalmic solution APPLY 1 DROP IN BOTH EYES ONCE A DAY    . budesonide-formoterol (SYMBICORT) 160-4.5 MCG/ACT inhaler Inhale 2 puffs into the lungs 2 (two) times daily. Use with spacer. 1 Inhaler 5  . calcium carbonate (TUMS - DOSED IN MG ELEMENTAL CALCIUM) 500 MG chewable tablet Chew 1 tablet by mouth daily as needed for indigestion or heartburn.     Marland Kitchen CALCIUM PO Take 1 tablet by mouth daily.    . cholestyramine (QUESTRAN) 4 g packet 1 packet mixed with water or non-carbonated drink    . clobetasol cream (TEMOVATE) 0.05 %     . clonazePAM (KLONOPIN) 1 MG tablet Take 1 mg by mouth 2 (two) times daily.     . cloNIDine (CATAPRES) 0.1 MG tablet Take 1 tablet (0.1 mg total) by mouth at bedtime. 90 tablet 3  . cyanocobalamin (,VITAMIN B-12,) 1000 MCG/ML injection Inject 1 mL (1,000 mcg total) into the muscle every 30 (thirty) days. 12 mL 0  . Desoximetasone (TOPICORT) 0.25 % ointment Apply 1 application topically 2 (two) times daily. 30 g 1  . diphenhydrAMINE (BENADRYL) 25 MG tablet Take 25 mg by mouth at bedtime as needed for allergies or sleep.     Marland Kitchen EPINEPHrine 0.3  mg/0.3 mL IJ SOAJ injection SMARTSIG:0.3 Milliliter(s) IM Once PRN    . fluorometholone (FML) 0.1 % ophthalmic suspension Place 1 drop into both eyes 4 (four) times daily as needed (itching).   1  . fluorouracil (EFUDEX) 5 % cream 1 application    . fluticasone (FLOVENT HFA) 44 MCG/ACT inhaler 2 puffs    . fluticasone furoate-vilanterol (BREO ELLIPTA) 100-25 MCG/INH AEPB Inhale 1 puff into the lungs daily. For wheezing suppression 1 each 3  . hydrocortisone (ANUSOL-HC) 25 MG suppository Place  1 suppository (25 mg total) rectally at bedtime as needed for hemorrhoids. 12 suppository 0  . hydrocortisone 2.5 % cream Apply 1 application topically 2 (two) times daily as needed (irritation). 30 g 0  . hyoscyamine (LEVSIN SL) 0.125 MG SL tablet Place under the tongue.    Marland Kitchen ibuprofen (ADVIL,MOTRIN) 200 MG tablet Take 200-400 mg by mouth 3 (three) times daily as needed for moderate pain.     Marland Kitchen levETIRAcetam (KEPPRA) 250 MG tablet Take 250 mg by mouth 3 (three) times daily. 6 am, 12 pm, 6 pm    . levocetirizine (XYZAL) 5 MG tablet TAKE 1 TABLET BY MOUTH EVERY DAY AS NEEDED 30 tablet 0  . lipase/protease/amylase (CREON) 12000 units CPEP capsule Take 12,000 Units by mouth daily with breakfast.    . loratadine (CLARITIN) 10 MG tablet Take 10 mg by mouth daily.    Lita Mains (ZYLET) 0.5-0.3 % SUSP Administer 1 drop to the right eye daily as needed.    . metoprolol tartrate (LOPRESSOR) 25 MG tablet TAKE 1 TABLET (25 MG TOTAL) BY MOUTH 2 (TWO) TIMES DAILY. 180 tablet 3  . montelukast (SINGULAIR) 10 MG tablet Take 1 tablet (10 mg total) by mouth at bedtime. 30 tablet 5  . nystatin (MYCOSTATIN) 100000 UNIT/ML suspension Use as directed 5 mLs in the mouth or throat 3 (three) times daily as needed (thrush).    . Olopatadine HCl (PATADAY) 0.2 % SOLN Use one drop in each eye once daily as needed. 2.5 mL 5  . polyethylene glycol (MIRALAX / GLYCOLAX) packet Take 17 g by mouth at bedtime.    . potassium  chloride SA (K-DUR,KLOR-CON) 20 MEQ tablet Take 1 tablet (20 mEq total) by mouth 2 (two) times daily. 60 tablet 3  . prednisoLONE acetate (PRED FORTE) 1 % ophthalmic suspension SMARTSIG:In Eye(s)    . sodium chloride (OCEAN) 0.65 % SOLN nasal spray Place 1 spray into both nostrils as needed for congestion.     Marland Kitchen Spacer/Aero-Holding Chambers ([place]) MISC See admin instructions.    . Syringe/Needle, Disp, (SYRINGE 3CC/27GX1-1/4") 27G X 1-1/4" 3 ML MISC Inject 1 mL into the muscle every 30 (thirty) days. 12 each 0  . valACYclovir (VALTREX) 1000 MG tablet Take 1 tablet (1,000 mg total) by mouth 3 (three) times daily. For shingles 21 tablet 0  . Vitamin D, Ergocalciferol, (DRISDOL) 50000 units CAPS capsule TAKE 1 CAPSULE (50,000 UNITS TOTAL) BY MOUTH EVERY 7 (SEVEN) DAYS. (Patient taking differently: Take 50,000 Units by mouth See admin instructions. Take 50000 units by mouth every Mon, Wed, and Sat) 4 capsule 11   No current facility-administered medications for this visit.    Allergies:   Bee venom, Doxycycline, Erythromycin, Gadavist [gadobutrol], Iodinated diagnostic agents, Other, Penicillins, Sulfa antibiotics, Preparation h [pramox-pe-glycerin-petrolatum], Sulfonamide derivatives, and Phenergan [promethazine hcl]   Social History:  The patient  reports that she has never smoked. She has never used smokeless tobacco. She reports previous alcohol use. She reports that she does not use drugs.   Family History:  The patient's family history includes Allergic rhinitis in her mother and sister; Aneurysm in her sister; Clotting disorder in her maternal grandmother; Colon cancer in her mother; Colon polyps in her mother; Crohn's disease in her maternal grandmother; Diabetes in her father, maternal grandmother, and paternal grandmother; Heart disease in her father; Kidney disease in her father; Stroke in her mother; Urticaria in her mother.  ROS:  Please see the history of present illness.     All  other systems are reviewed and otherwise negative.   PHYSICAL EXAM:  VS:  There were no vitals taken for this visit. BMI: There is no height or weight on file to calculate BMI. Well nourished, well developed, in no acute distress HEENT: normocephalic, atraumatic Neck: no JVD, carotid bruits or masses Cardiac:  *** RRR; no significant murmurs, no rubs, or gallops Lungs:  *** CTA b/l, no wheezing, rhonchi or rales Abd: soft, nontender MS: no deformity or *** atrophy Ext: *** no edema Skin: warm and dry, no rash Neuro:  No gross deficits appreciated Psych: euthymic mood, full affect  *** PPM site is stable, no tethering or discomfort   EKG:  Done today and reviewed by myself shows  ***  Device interrogation done today and reviewed by myself:  ***   12/12/2012: TTE Study Conclusions  - Left ventricle: The cavity size was normal. Wall thickness  was normal. Systolic function was normal. The estimated  ejection fraction was in the range of 60% to 65%. Features  are consistent with a pseudonormal left ventricular  filling pattern, with concomitant abnormal relaxation and  increased filling pressure (grade 2 diastolic  dysfunction).  - Mitral valve: Mild regurgitation.     Recent Labs: 05/19/2019: Creatinine, Ser 1.10  No results found for requested labs within last 8760 hours.   CrCl cannot be calculated (Patient's most recent lab result is older than the maximum 21 days allowed.).   Wt Readings from Last 3 Encounters:  09/07/19 156 lb 6.4 oz (70.9 kg)  06/19/19 146 lb (66.2 kg)  12/08/18 153 lb 12.8 oz (69.8 kg)     Other studies reviewed: Additional studies/records reviewed today include: summarized above  ASSESSMENT AND PLAN:  1. PPM     ***  2. ATach     ***  3. Dysautonomia     ***   Disposition: F/u with ***  Current medicines are reviewed at length with the patient today.  The patient did not have any concerns regarding  medicines.  Venetia Night, PA-C 04/03/2020 9:58 AM     [facility] [address] [place] [address] [phone] (office)  [phone] (fax)

## 2020-04-05 ENCOUNTER — Telehealth: Payer: Self-pay | Admitting: Internal Medicine

## 2020-04-05 ENCOUNTER — Encounter: Payer: Medicare Other | Admitting: Physician Assistant

## 2020-04-05 NOTE — Telephone Encounter (Signed)
New message   Pt called asking about a time when Dr. Caryl Comes is in the hospital so she can her MRI scheduled. Please call.

## 2020-04-12 ENCOUNTER — Encounter: Payer: Self-pay | Admitting: Nurse Practitioner

## 2020-04-12 ENCOUNTER — Other Ambulatory Visit: Payer: Self-pay

## 2020-04-12 ENCOUNTER — Ambulatory Visit (INDEPENDENT_AMBULATORY_CARE_PROVIDER_SITE_OTHER): Payer: Medicare Other | Admitting: Nurse Practitioner

## 2020-04-12 VITALS — BP 118/84 | HR 66 | Ht 63.8 in | Wt 156.0 lb

## 2020-04-12 DIAGNOSIS — I495 Sick sinus syndrome: Secondary | ICD-10-CM

## 2020-04-12 DIAGNOSIS — G901 Familial dysautonomia [Riley-Day]: Secondary | ICD-10-CM

## 2020-04-12 DIAGNOSIS — I471 Supraventricular tachycardia: Secondary | ICD-10-CM

## 2020-04-12 LAB — CUP PACEART INCLINIC DEVICE CHECK
Date Time Interrogation Session: 20220215125530
Implantable Lead Implant Date: 19990303
Implantable Lead Implant Date: 19990303
Implantable Lead Location: 753859
Implantable Lead Location: 753860
Implantable Lead Model: 5092
Implantable Pulse Generator Implant Date: 20120905
Lead Channel Impedance Value: 370 Ohm
Lead Channel Impedance Value: 409 Ohm
Lead Channel Pacing Threshold Amplitude: 0.7 V
Lead Channel Pacing Threshold Amplitude: 0.7 V
Lead Channel Pacing Threshold Amplitude: 1.4 V
Lead Channel Pacing Threshold Amplitude: 1.4 V
Lead Channel Pacing Threshold Pulse Width: 0.4 ms
Lead Channel Pacing Threshold Pulse Width: 0.4 ms
Lead Channel Pacing Threshold Pulse Width: 1 ms
Lead Channel Pacing Threshold Pulse Width: 1 ms
Lead Channel Sensing Intrinsic Amplitude: 5.4 mV
Lead Channel Sensing Intrinsic Amplitude: 6.3 mV
Lead Channel Sensing Intrinsic Amplitude: 6.3 mV
Lead Channel Setting Pacing Amplitude: 2 V
Lead Channel Setting Pacing Amplitude: 3.4 V
Lead Channel Setting Pacing Pulse Width: 1 ms
Pulse Gen Serial Number: 66195584

## 2020-04-12 NOTE — Progress Notes (Signed)
Electrophysiology Office Note Date: 05/05/2020  ID:  [name], DOB 1955-11-05, MRN [id number]  PCP: Burnis Medin, MD Electrophysiologist: Caryl Comes  CC: Pacemaker follow-up  [name] is a 65 y.o. female seen today for Dr Caryl Comes.  She presents today for routine electrophysiology followup.  Since last being seen in our clinic, the patient reports doing reasonably well.  She denies (above baseline) chest pain, palpitations, dyspnea, PND, orthopnea, nausea, vomiting, dizziness, syncope, edema, weight gain, or early satiety.  Device History: Biotronik dual chamber PPM implanted 2012 for sinus node dysfunction    Past Medical History:  Diagnosis Date  . Angio-edema   . Arthritis   . Asthma   . Atrial fibrillation ([place])   . Atrial tachycardia ([place])   . Cervicalgia   . Complication of anesthesia    pt states wakes up with "shakes"  . CVA (cerebral infarction)    2012 with dizziness and vision change felt embolic from atrial tachy  . Disorder of bone and cartilage, unspecified   . Disturbance of skin sensation   . Diverticulosis   . DM (diabetes mellitus) ([place])   . DVT (deep venous thrombosis) ([place])   . DVT (deep venous thrombosis) ([place])   . Eczema   . Family history of anesthesia complication    PONV  . Fatty liver   . GERD (gastroesophageal reflux disease)   . History of hiatal hernia   . History of pacemaker   . History of radiation therapy 10/24/12   brain  . HLD (hyperlipidemia)   . HTN (hypertension)   . Hypoglycemia, unspecified   . 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 Liver metastasis ([place])   . Lung metastasis ([place])   . Metastasis to brain ([place])   . Osteopenia   . Other nonspecific abnormal serum enzyme levels   . Other specified congenital anomalies of nervous system   . Pacemaker    autonomic dysfunction  . Pancreatitis    from therapy  . Peripheral vascular disease ([place])   . Pneumonia   . PONV (postoperative  nausea and vomiting)   . POTS (postural orthostatic tachycardia syndrome)   . Recurrent upper respiratory infection (URI)   . Skin cancer    Hx: of lung lesion  . Stroke [facility])    left weaker   . Swelling of limb   . Urticaria    Past Surgical History:  Procedure Laterality Date  . ABLATION SAPHENOUS VEIN W/ RFA     2002  . ADENOIDECTOMY    . CARPAL TUNNEL RELEASE Left   . CHOLECYSTECTOMY  03/28/2018  . CHOLECYSTECTOMY N/A 03/28/2018   Procedure: LAPAROSCOPIC CHOLECYSTECTOMY WITH INTRAOPERATIVE CHOLANGIOGRAM;  Surgeon: Johnathan Hausen, MD;  Location: [facility];  Service: General;  Laterality: N/A;  . CRANIOTOMY N/A 09/30/2012   suboccipital craniectomy  . CRANIOTOMY Left 02/09/2013   Procedure: LEFT PARIETAL CRANIOTOMY with stealth;  Surgeon: [name], MD;  Location: [place] [facility];  Service: Neurosurgery;  Laterality: Left;  LEFT Parietal Craniotomy for tumor with stealth  . DEEP AXILLARY SENTINEL NODE BIOPSY / EXCISION     due to extensive Melanoma-right arm  . fatty tumor removed     from chest  . INSERT / REPLACE / REMOVE PACEMAKER  2012   1999, x 3  . LAPAROSCOPY  09/06/2011   Procedure: LAPAROSCOPY OPERATIVE;  Surgeon: Claiborne Billings A. Pamala Hurry, MD;  Location: [facility];  Service: Gynecology;  Laterality: Left;  with Left  Ovarian Cystectomy   . MELANOMA EXCISION     with removal of lymph nodes, left shoulder  . NOSE SURGERY  2010, 2012   for nose bleeds x 2  . PACEMAKER IMPLANT    . SINOSCOPY    . TONSILLECTOMY AND ADENOIDECTOMY      Current Outpatient Medications  Medication Sig Dispense Refill  . acetaminophen (TYLENOL) 500 MG tablet Take 500 mg by mouth at bedtime.    Marland Kitchen albuterol (PROVENTIL) (2.5 MG/3ML) 0.083% nebulizer solution Take 3 mLs (2.5 mg total) by nebulization every 4 (four) hours as needed for wheezing or shortness of breath. 180 mL 1  . albuterol (VENTOLIN HFA) 108 (90 Base) MCG/ACT inhaler INHALE 2 PUFFS INTO THE LUNGS EVERY 4 (FOUR) HOURS AS NEEDED FOR WHEEZING OR  SHORTNESS OF BREATH. 18 Inhaler 0  . augmented betamethasone dipropionate (DIPROLENE) 0.05 % ointment Apply topically 2 (two) times daily. Limit to no longer than 2 weeks use. Not on face. 50 g 0  . bimatoprost (LATISSE) 0.03 % ophthalmic solution APPLY 1 DROP IN BOTH EYES ONCE A DAY    . budesonide-formoterol (SYMBICORT) 160-4.5 MCG/ACT inhaler Inhale 2 puffs into the lungs 2 (two) times daily. Use with spacer. 1 Inhaler 5  . calcium carbonate (TUMS - DOSED IN MG ELEMENTAL CALCIUM) 500 MG chewable tablet Chew 1 tablet by mouth daily as needed for indigestion or heartburn.     Marland Kitchen CALCIUM PO Take 1 tablet by mouth daily.    . cholestyramine (QUESTRAN) 4 g packet 1 packet mixed with water or non-carbonated drink    . clobetasol cream (TEMOVATE) 0.05 %     . clonazePAM (KLONOPIN) 1 MG tablet Take 1 mg by mouth 2 (two) times daily.     . cloNIDine (CATAPRES) 0.1 MG tablet Take 1 tablet (0.1 mg total) by mouth at bedtime. 90 tablet 3  . cyanocobalamin (,VITAMIN B-12,) 1000 MCG/ML injection Inject 1 mL (1,000 mcg total) into the muscle every 30 (thirty) days. 12 mL 0  . Desoximetasone (TOPICORT) 0.25 % ointment Apply 1 application topically 2 (two) times daily. 30 g 1  . diphenhydrAMINE (BENADRYL) 25 MG tablet Take 25 mg by mouth at bedtime as needed for allergies or sleep.     Marland Kitchen EPINEPHrine 0.3 mg/0.3 mL IJ SOAJ injection SMARTSIG:0.3 Milliliter(s) IM Once PRN    . fluorometholone (FML) 0.1 % ophthalmic suspension Place 1 drop into both eyes 4 (four) times daily as needed (itching).   1  . fluorouracil (EFUDEX) 5 % cream 1 application    . fluticasone (FLOVENT HFA) 44 MCG/ACT inhaler 2 puffs    . fluticasone furoate-vilanterol (BREO ELLIPTA) 100-25 MCG/INH AEPB Inhale 1 puff into the lungs daily. For wheezing suppression 1 each 3  . hydrocortisone (ANUSOL-HC) 25 MG suppository Place 1 suppository (25 mg total) rectally at bedtime as needed for hemorrhoids. 12 suppository 0  . hydrocortisone 2.5 % cream  Apply 1 application topically 2 (two) times daily as needed (irritation). 30 g 0  . hyoscyamine (LEVSIN SL) 0.125 MG SL tablet Place under the tongue.    Marland Kitchen ibuprofen (ADVIL,MOTRIN) 200 MG tablet Take 200-400 mg by mouth 3 (three) times daily as needed for moderate pain.     Marland Kitchen levETIRAcetam (KEPPRA) 250 MG tablet Take 250 mg by mouth 3 (three) times daily. 6 am, 12 pm, 6 pm    . levocetirizine (XYZAL) 5 MG tablet TAKE 1 TABLET BY MOUTH EVERY DAY AS NEEDED 30 tablet 0  . lipase/protease/amylase (CREON)  12000 units CPEP capsule Take 12,000 Units by mouth daily with breakfast.    . loratadine (CLARITIN) 10 MG tablet Take 10 mg by mouth daily.    Lita Mains (ZYLET) 0.5-0.3 % SUSP Administer 1 drop to the right eye daily as needed.    . metoprolol tartrate (LOPRESSOR) 25 MG tablet TAKE 1 TABLET (25 MG TOTAL) BY MOUTH 2 (TWO) TIMES DAILY. 180 tablet 3  . montelukast (SINGULAIR) 10 MG tablet Take 1 tablet (10 mg total) by mouth at bedtime. 30 tablet 5  . nystatin (MYCOSTATIN) 100000 UNIT/ML suspension Use as directed 5 mLs in the mouth or throat 3 (three) times daily as needed (thrush).    . Olopatadine HCl (PATADAY) 0.2 % SOLN Use one drop in each eye once daily as needed. 2.5 mL 5  . polyethylene glycol (MIRALAX / GLYCOLAX) packet Take 17 g by mouth at bedtime.    . potassium chloride SA (K-DUR,KLOR-CON) 20 MEQ tablet Take 1 tablet (20 mEq total) by mouth 2 (two) times daily. 60 tablet 3  . prednisoLONE acetate (PRED FORTE) 1 % ophthalmic suspension SMARTSIG:In Eye(s)    . sodium chloride (OCEAN) 0.65 % SOLN nasal spray Place 1 spray into both nostrils as needed for congestion.     Marland Kitchen Spacer/Aero-Holding Chambers ([name]) MISC See admin instructions.    . Syringe/Needle, Disp, (SYRINGE 3CC/27GX1-1/4") 27G X 1-1/4" 3 ML MISC Inject 1 mL into the muscle every 30 (thirty) days. 12 each 0  . valACYclovir (VALTREX) 1000 MG tablet Take 1 tablet (1,000 mg total) by mouth 3 (three)  times daily. For shingles 21 tablet 0  . Vitamin D, Ergocalciferol, (DRISDOL) 50000 units CAPS capsule TAKE 1 CAPSULE (50,000 UNITS TOTAL) BY MOUTH EVERY 7 (SEVEN) DAYS. (Patient taking differently: Take 50,000 Units by mouth See admin instructions. Take 50000 units by mouth every Mon, Wed, and Sat) 4 capsule 11  . predniSONE (DELTASONE) 50 MG tablet Take one tab po 13 hours prior, then one tab 7 hours prior, and then one tab 1 hour prior to CT. Please encourage pt to take 50 mg Diphenhydramine Benadryl PO within 1 hour of the contrast injection 3 tablet 0   No current facility-administered medications for this visit.    Allergies:   Bee venom, Doxycycline, Erythromycin, Gadavist [gadobutrol], Iodinated diagnostic agents, Other, Penicillins, Sulfa antibiotics, Preparation h [pramox-pe-glycerin-petrolatum], Sulfonamide derivatives, and Phenergan [promethazine hcl]   Social History: Social History   Socioeconomic History  . Marital status: Married    Spouse name: Not on file  . Number of children: 0  . Years of education: Not on file  . Highest education level: Not on file  Occupational History  . Occupation: PRESIDENT    Employer: [employer]    Comment: self employed  Tobacco Use  . Smoking status: Never Smoker  . Smokeless tobacco: Never Used  Vaping Use  . Vaping Use: Never used  Substance and Sexual Activity  . Alcohol use: Not Currently    Comment: glass of wine daily  . Drug use: No  . Sexual activity: Not on file  Other Topics Concern  . Not on file  Social History Narrative   4 years college hh of 2    Neg tad   Social Determinants of Health   Financial Resource Strain: Low Risk   . Difficulty of Paying Living Expenses: Not hard at all  Food Insecurity: No Food Insecurity  . Worried About Charity fundraiser in the Last Year:  Never true  . Ran Out of Food in the Last Year: Never true  Transportation Needs: No Transportation Needs  . Lack of Transportation  (Medical): No  . Lack of Transportation (Non-Medical): No  Physical Activity: Sufficiently Active  . Days of Exercise per Week: 5 days  . Minutes of Exercise per Session: 30 min  Stress: Stress Concern Present  . Feeling of Stress : To some extent  Social Connections: Unknown  . Frequency of Communication with Friends and Family: More than three times a week  . Frequency of Social Gatherings with Friends and Family: Once a week  . Attends Religious Services: Not on file  . Active Member of Clubs or Organizations: No  . Attends Archivist Meetings: Never  . Marital Status: Married  Human resources officer Violence: Unknown  . Fear of Current or Ex-Partner: No  . Emotionally Abused: No  . Physically Abused: No  . Sexually Abused: Not on file    Family History: Family History  Problem Relation Age of Onset  . Allergic rhinitis Sister   . Stroke Mother   . Colon polyps Mother   . Colon cancer Mother   . Urticaria Mother   . Allergic rhinitis Mother   . Heart disease Father   . Diabetes Father   . Kidney disease Father   . Diabetes Maternal Grandmother   . Clotting disorder Maternal Grandmother        stroke  . Crohn's disease Maternal Grandmother   . Diabetes Paternal Grandmother   . Aneurysm Sister        brain     Review of Systems: All other systems reviewed and are otherwise negative except as noted above.   Physical Exam: VS:  BP 118/84   Pulse 66   Ht 5' 3.8" (1.621 m)   Wt 156 lb (70.8 kg)   SpO2 99%   BMI 26.95 kg/m  , BMI Body mass index is 26.95 kg/m.  GEN- The patient is well appearing, alert and oriented x 3 today.   HEENT: normocephalic, atraumatic; sclera clear, conjunctiva pink; hearing intact; oropharynx clear; neck supple  Lungs- Clear to ausculation bilaterally, normal work of breathing.  No wheezes, rales, rhonchi Heart- Regular rate and rhythm  GI- soft, non-tender, non-distended, bowel sounds present  Extremities- no clubbing,  cyanosis, or edema  MS- no significant deformity or atrophy Skin- warm and dry, no rash or lesion; PPM pocket well healed Psych- euthymic mood, full affect Neuro- strength and sensation are intact  PPM Interrogation- reviewed in detail today,  See PACEART report  EKG:  EKG is ordered today. The ekg ordered today shows atrial pacing with intrinsic ventricular conduction, rate 66  Recent Labs: 05/19/2019: Creatinine, Ser 1.10   Wt Readings from Last 3 Encounters:  04/12/20 156 lb (70.8 kg)  09/07/19 156 lb 6.4 oz (70.9 kg)  06/19/19 146 lb (66.2 kg)     Other studies Reviewed: Additional studies/ records that were reviewed today include:Dr Klein's notes   Assessment and Plan:  1.  Sinus node dysfunction Normal PPM function See Pace Art report No changes today  2.  Dysautonomia Stable No change required today  3.  Atrial tachycardia  Not clinically significant Will follow    Current medicines are reviewed at length with the patient today.   The patient does not have concerns regarding her medicines.  The following changes were made today:  none  Labs/ tests ordered today include: none Orders Placed This Encounter  Procedures  .  CUP PACEART [place]  . EKG 12-Lead     Disposition:   Follow up with 1 year Dr Caryl Comes     Signed, Chanetta Marshall, NP 05/05/2020 7:57 AM  [facility] [address] [place] [address] [phone] (office) [phone] (fax)

## 2020-04-12 NOTE — Patient Instructions (Signed)
Medication Instructions:  Your physician recommends that you continue on your current medications as directed. Please refer to the Current Medication list given to you today.  *If you need a refill on your cardiac medications before your next appointment, please call your pharmacy*   Lab Work: None If you have labs (blood work) drawn today and your tests are completely normal, you will receive your results only by: Marland Kitchen MyChart Message (if you have MyChart) OR . A paper copy in the mail If you have any lab test that is abnormal or we need to change your treatment, we will call you to review the results.   Follow-Up: At [facility], you and your health needs are our priority.  As part of our continuing mission to provide you with exceptional heart care, we have created designated Provider Care Teams.  These Care Teams include your primary Cardiologist (physician) and Advanced Practice Providers (APPs -  Physician Assistants and Nurse Practitioners) who all work together to provide you with the care you need, when you need it.  Your next appointment:   1 year(s)  The format for your next appointment:   In Person  Provider:   Virl Axe, MD

## 2020-04-14 ENCOUNTER — Other Ambulatory Visit: Payer: Self-pay | Admitting: Radiation Therapy

## 2020-04-14 DIAGNOSIS — C7931 Secondary malignant neoplasm of brain: Secondary | ICD-10-CM

## 2020-04-18 DIAGNOSIS — J3089 Other allergic rhinitis: Secondary | ICD-10-CM | POA: Diagnosis not present

## 2020-04-18 DIAGNOSIS — R21 Rash and other nonspecific skin eruption: Secondary | ICD-10-CM | POA: Diagnosis not present

## 2020-04-18 DIAGNOSIS — J4530 Mild persistent asthma, uncomplicated: Secondary | ICD-10-CM | POA: Diagnosis not present

## 2020-04-18 DIAGNOSIS — Z91018 Allergy to other foods: Secondary | ICD-10-CM | POA: Diagnosis not present

## 2020-04-19 ENCOUNTER — Other Ambulatory Visit: Payer: Self-pay | Admitting: Radiation Therapy

## 2020-04-19 ENCOUNTER — Telehealth: Payer: Self-pay | Admitting: Internal Medicine

## 2020-04-19 NOTE — Telephone Encounter (Signed)
[name] is calling stating the patient is needing an MRI. Due to the patient having a pacemaker Dr. Caryl Comes normally comes to oversee her MRI after clinic. She is wanting to know what days work for him so she can schedule the MRI at [facility] to fit his schedule for him to come. Please advise.

## 2020-04-19 NOTE — Telephone Encounter (Signed)
Spoke with [name] and provided dates of March 1st, 7th or 8th to schedule MRI.  [name] states she will contact imaging and schedule and will reach back out to let Dr Caryl Comes know which date to be available.

## 2020-04-20 DIAGNOSIS — J3089 Other allergic rhinitis: Secondary | ICD-10-CM | POA: Diagnosis not present

## 2020-04-20 DIAGNOSIS — Z91018 Allergy to other foods: Secondary | ICD-10-CM | POA: Diagnosis not present

## 2020-04-20 DIAGNOSIS — R21 Rash and other nonspecific skin eruption: Secondary | ICD-10-CM | POA: Diagnosis not present

## 2020-04-20 DIAGNOSIS — J4530 Mild persistent asthma, uncomplicated: Secondary | ICD-10-CM | POA: Diagnosis not present

## 2020-04-21 ENCOUNTER — Other Ambulatory Visit: Payer: Self-pay | Admitting: Radiation Oncology

## 2020-04-21 MED ORDER — PREDNISONE 50 MG PO TABS: ORAL_TABLET | ORAL | 0 refills | Status: DC

## 2020-04-22 DIAGNOSIS — R21 Rash and other nonspecific skin eruption: Secondary | ICD-10-CM | POA: Diagnosis not present

## 2020-04-22 DIAGNOSIS — J3089 Other allergic rhinitis: Secondary | ICD-10-CM | POA: Diagnosis not present

## 2020-04-22 DIAGNOSIS — J4530 Mild persistent asthma, uncomplicated: Secondary | ICD-10-CM | POA: Diagnosis not present

## 2020-04-22 DIAGNOSIS — Z91018 Allergy to other foods: Secondary | ICD-10-CM | POA: Diagnosis not present

## 2020-05-03 ENCOUNTER — Ambulatory Visit (HOSPITAL_COMMUNITY)
Admission: RE | Admit: 2020-05-03 | Discharge: 2020-05-03 | Disposition: A | Discharge status: Home / Self Care | Payer: Medicare Other | Source: Ambulatory Visit | Attending: Neurological Surgery | Admitting: Neurological Surgery

## 2020-05-03 ENCOUNTER — Other Ambulatory Visit: Payer: Self-pay

## 2020-05-03 DIAGNOSIS — G9389 Other specified disorders of brain: Secondary | ICD-10-CM | POA: Diagnosis not present

## 2020-05-03 DIAGNOSIS — C7931 Secondary malignant neoplasm of brain: Secondary | ICD-10-CM | POA: Diagnosis not present

## 2020-05-03 DIAGNOSIS — C439 Malignant melanoma of skin, unspecified: Secondary | ICD-10-CM | POA: Diagnosis not present

## 2020-05-03 MED ORDER — GADOBENATE DIMEGLUMINE 529 MG/ML IV SOLN
15.0000 mL | Freq: Once | INTRAVENOUS | Status: AC | PRN
  Administered 2020-05-03: 15 mL via INTRAVENOUS

## 2020-05-04 DIAGNOSIS — C7931 Secondary malignant neoplasm of brain: Secondary | ICD-10-CM | POA: Diagnosis not present

## 2020-05-06 ENCOUNTER — Other Ambulatory Visit: Payer: Self-pay

## 2020-05-06 DIAGNOSIS — G9001 Carotid sinus syncope: Secondary | ICD-10-CM

## 2020-05-06 DIAGNOSIS — I10 Essential (primary) hypertension: Secondary | ICD-10-CM

## 2020-05-06 DIAGNOSIS — R002 Palpitations: Secondary | ICD-10-CM

## 2020-05-06 DIAGNOSIS — G901 Familial dysautonomia [Riley-Day]: Secondary | ICD-10-CM

## 2020-05-06 DIAGNOSIS — R42 Dizziness and giddiness: Secondary | ICD-10-CM

## 2020-05-06 NOTE — Progress Notes (Signed)
Per Dr Caryl Comes order echo and upper and lower arterial dopplers.  Orders placed as requested.

## 2020-05-09 ENCOUNTER — Inpatient Hospital Stay: Payer: Medicare Other | Attending: Neurological Surgery

## 2020-05-16 ENCOUNTER — Other Ambulatory Visit (HOSPITAL_COMMUNITY): Payer: Self-pay | Admitting: Internal Medicine

## 2020-05-16 DIAGNOSIS — R209 Unspecified disturbances of skin sensation: Secondary | ICD-10-CM

## 2020-05-16 DIAGNOSIS — R0989 Other specified symptoms and signs involving the circulatory and respiratory systems: Secondary | ICD-10-CM

## 2020-05-18 ENCOUNTER — Other Ambulatory Visit: Payer: Self-pay

## 2020-05-18 ENCOUNTER — Ambulatory Visit (HOSPITAL_COMMUNITY)
Admission: RE | Admit: 2020-05-18 | Discharge: 2020-05-18 | Disposition: A | Discharge status: Home / Self Care | Payer: Medicare Other | Source: Ambulatory Visit | Attending: Internal Medicine | Admitting: Internal Medicine

## 2020-05-18 ENCOUNTER — Ambulatory Visit (HOSPITAL_COMMUNITY)
Admission: RE | Admit: 2020-05-18 | Discharge: 2020-05-18 | Disposition: A | Discharge status: Home / Self Care | Payer: Medicare Other | Source: Ambulatory Visit | Attending: Cardiology | Admitting: Cardiology

## 2020-05-18 DIAGNOSIS — R0989 Other specified symptoms and signs involving the circulatory and respiratory systems: Secondary | ICD-10-CM

## 2020-05-18 DIAGNOSIS — R002 Palpitations: Secondary | ICD-10-CM | POA: Insufficient documentation

## 2020-05-18 DIAGNOSIS — G9001 Carotid sinus syncope: Secondary | ICD-10-CM | POA: Insufficient documentation

## 2020-05-18 DIAGNOSIS — G901 Familial dysautonomia [Riley-Day]: Secondary | ICD-10-CM

## 2020-05-18 DIAGNOSIS — R42 Dizziness and giddiness: Secondary | ICD-10-CM | POA: Insufficient documentation

## 2020-05-18 DIAGNOSIS — I10 Essential (primary) hypertension: Secondary | ICD-10-CM

## 2020-05-18 DIAGNOSIS — R209 Unspecified disturbances of skin sensation: Secondary | ICD-10-CM

## 2020-05-23 ENCOUNTER — Other Ambulatory Visit: Payer: Self-pay

## 2020-05-23 MED ORDER — CLONIDINE HCL 0.1 MG PO TABS: 0.1000 mg | ORAL_TABLET | Freq: Every day | ORAL | 3 refills | Status: DC

## 2020-05-23 NOTE — Progress Notes (Signed)
Per Dr Caryl Comes - refill pt's Clonidine 0.1mg  - 1 tablet by mouth at bedtime. #90 with 3 RF sent to pharmacy on file.

## 2020-05-31 ENCOUNTER — Other Ambulatory Visit: Payer: Self-pay

## 2020-05-31 ENCOUNTER — Ambulatory Visit (HOSPITAL_COMMUNITY): Payer: Medicare Other | Attending: Cardiology

## 2020-05-31 ENCOUNTER — Encounter: Payer: Self-pay | Admitting: Internal Medicine

## 2020-05-31 ENCOUNTER — Ambulatory Visit (INDEPENDENT_AMBULATORY_CARE_PROVIDER_SITE_OTHER): Payer: Medicare Other | Admitting: Internal Medicine

## 2020-05-31 VITALS — BP 120/70 | HR 64 | Ht 63.0 in | Wt 151.0 lb

## 2020-05-31 DIAGNOSIS — G90A Postural orthostatic tachycardia syndrome (POTS): Secondary | ICD-10-CM

## 2020-05-31 DIAGNOSIS — G901 Familial dysautonomia [Riley-Day]: Secondary | ICD-10-CM | POA: Insufficient documentation

## 2020-05-31 DIAGNOSIS — I498 Other specified cardiac arrhythmias: Secondary | ICD-10-CM | POA: Diagnosis not present

## 2020-05-31 DIAGNOSIS — R002 Palpitations: Secondary | ICD-10-CM | POA: Diagnosis not present

## 2020-05-31 DIAGNOSIS — I10 Essential (primary) hypertension: Secondary | ICD-10-CM | POA: Insufficient documentation

## 2020-05-31 DIAGNOSIS — G9001 Carotid sinus syncope: Secondary | ICD-10-CM | POA: Diagnosis not present

## 2020-05-31 DIAGNOSIS — R42 Dizziness and giddiness: Secondary | ICD-10-CM | POA: Diagnosis not present

## 2020-05-31 LAB — ECHOCARDIOGRAM COMPLETE
Area-P 1/2: 4.89 cm2
Height: 63 in
S' Lateral: 2 cm
Weight: 2416 oz

## 2020-05-31 NOTE — Patient Instructions (Addendum)
Medication Instructions:  Your physician has recommended you make the following change in your medication:   Stop your Lopressor (Metoprolol) as discussed by Dr Caryl Comes.  *If you need a refill on your cardiac medications before your next appointment, please call your pharmacy*   Lab Work: None ordered.  If you have labs (blood work) drawn today and your tests are completely normal, you will receive your results only by: Marland Kitchen MyChart Message (if you have MyChart) OR . A paper copy in the mail If you have any lab test that is abnormal or we need to change your treatment, we will call you to review the results.   Testing/Procedures: None ordered.    Follow-Up: At [facility], you and your health needs are our priority.  As part of our continuing mission to provide you with exceptional heart care, we have created designated Provider Care Teams.  These Care Teams include your primary Cardiologist (physician) and Advanced Practice Providers (APPs -  Physician Assistants and Nurse Practitioners) who all work together to provide you with the care you need, when you need it.  We recommend signing up for the patient portal called "MyChart".  Sign up information is provided on this After Visit Summary.  MyChart is used to connect with patients for Virtual Visits (Telemedicine).  Patients are able to view lab/test results, encounter notes, upcoming appointments, etc.  Non-urgent messages can be sent to your provider as well.   To learn more about what you can do with MyChart, go to [email or web address].    Your next appointment:   12 month(s)  The format for your next appointment:   In Person  Provider:   Virl Axe, MD

## 2020-05-31 NOTE — Progress Notes (Signed)
Patient Care Team: Panosh, Standley Brooking, MD as PCP - Lisa Roca, MD as Consulting Physician (Neurosurgery) [name], MD as Consulting Physician (Oncology) [name], MD as Referring Physician (Internal Medicine) Jodi Marble, MD as Consulting Physician (Otolaryngology) Danella Sensing, MD as Consulting Physician (Dermatology) Viona Gilmore, [facility] as Pharmacist (Pharmacist)   HPI  [name] is a 65 y.o. female Seen in follow-up for dysautonomia as well as pacemaker-Biotronik implanted for dysautonomia and bradycardia for device interrogation Hx of .  She was seen a few weeks ago at the time of her MRI  The patient denies chest pain , shortness of breath, nocturnal dyspnea, orthopnea or peripheral edema.  There have been no syncope.    Complains of cold hands and feet.  Arterial Dopplers of upper lower extremities were normal; perhaps aggravated by cold.  Difficult warm up.  Somewhat painful.  Increasing problems of late with lightheadedness on abrupt standing.  Bowel function is okay.  Some dry mouth.         Past Medical History:  Diagnosis Date  . Angio-edema   . Arthritis   . Asthma   . Atrial fibrillation ([place])   . Atrial tachycardia ([place])   . Cervicalgia   . Complication of anesthesia    pt states wakes up with "shakes"  . CVA (cerebral infarction)    2012 with dizziness and vision change felt embolic from atrial tachy  . Disorder of bone and cartilage, unspecified   . Disturbance of skin sensation   . Diverticulosis   . DM (diabetes mellitus) ([place])   . DVT (deep venous thrombosis) ([place])   . DVT (deep venous thrombosis) ([place])   . Eczema   . Family history of anesthesia complication    PONV  . Fatty liver   . GERD (gastroesophageal reflux disease)   . History of hiatal hernia   . History of pacemaker   . History of radiation therapy 10/24/12   brain  . HLD (hyperlipidemia)   . HTN (hypertension)   . Hypoglycemia, unspecified    . 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 Liver metastasis ([place])   . Lung metastasis ([place])   . Metastasis to brain ([place])   . Osteopenia   . Other nonspecific abnormal serum enzyme levels   . Other specified congenital anomalies of nervous system   . Pacemaker    autonomic dysfunction  . Pancreatitis    from therapy  . Peripheral vascular disease ([place])   . Pneumonia   . PONV (postoperative nausea and vomiting)   . POTS (postural orthostatic tachycardia syndrome)   . Recurrent upper respiratory infection (URI)   . Skin cancer    Hx: of lung lesion  . Stroke [facility])    left weaker   . Swelling of limb   . Urticaria     Past Surgical History:  Procedure Laterality Date  . ABLATION SAPHENOUS VEIN W/ RFA     2002  . ADENOIDECTOMY    . CARPAL TUNNEL RELEASE Left   . CHOLECYSTECTOMY  03/28/2018  . CHOLECYSTECTOMY N/A 03/28/2018   Procedure: LAPAROSCOPIC CHOLECYSTECTOMY WITH INTRAOPERATIVE CHOLANGIOGRAM;  Surgeon: Johnathan Hausen, MD;  Location: [place];  Service: General;  Laterality: N/A;  . CRANIOTOMY N/A 09/30/2012   suboccipital craniectomy  . CRANIOTOMY Left 02/09/2013   Procedure: LEFT PARIETAL CRANIOTOMY with stealth;  Surgeon: [name], MD;  Location: [place] [facility];  Service: Neurosurgery;  Laterality: Left;  LEFT Parietal  Craniotomy for tumor with stealth  . DEEP AXILLARY SENTINEL NODE BIOPSY / EXCISION     due to extensive Melanoma-right arm  . fatty tumor removed     from chest  . INSERT / REPLACE / REMOVE PACEMAKER  2012   1999, x 3  . LAPAROSCOPY  09/06/2011   Procedure: LAPAROSCOPY OPERATIVE;  Surgeon: Claiborne Billings A. Pamala Hurry, MD;  Location: [place] [facility];  Service: Gynecology;  Laterality: Left;  with Left Ovarian Cystectomy   . MELANOMA EXCISION     with removal of lymph nodes, left shoulder  . NOSE SURGERY  2010, 2012   for nose bleeds x 2  . PACEMAKER IMPLANT    . SINOSCOPY    . TONSILLECTOMY AND ADENOIDECTOMY      Current Outpatient  Medications  Medication Sig Dispense Refill  . acetaminophen (TYLENOL) 500 MG tablet Take 500 mg by mouth at bedtime.    Marland Kitchen albuterol (PROVENTIL) (2.5 MG/3ML) 0.083% nebulizer solution Take 3 mLs (2.5 mg total) by nebulization every 4 (four) hours as needed for wheezing or shortness of breath. 180 mL 1  . albuterol (VENTOLIN HFA) 108 (90 Base) MCG/ACT inhaler INHALE 2 PUFFS INTO THE LUNGS EVERY 4 (FOUR) HOURS AS NEEDED FOR WHEEZING OR SHORTNESS OF BREATH. 18 Inhaler 0  . augmented betamethasone dipropionate (DIPROLENE) 0.05 % ointment Apply topically 2 (two) times daily. Limit to no longer than 2 weeks use. Not on face. 50 g 0  . bimatoprost (LATISSE) 0.03 % ophthalmic solution APPLY 1 DROP IN BOTH EYES ONCE A DAY    . budesonide-formoterol (SYMBICORT) 160-4.5 MCG/ACT inhaler Inhale 2 puffs into the lungs 2 (two) times daily. Use with spacer. 1 Inhaler 5  . calcium carbonate (TUMS - DOSED IN MG ELEMENTAL CALCIUM) 500 MG chewable tablet Chew 1 tablet by mouth daily as needed for indigestion or heartburn.     Marland Kitchen CALCIUM PO Take 1 tablet by mouth daily.    . cholestyramine (QUESTRAN) 4 g packet 1 packet mixed with water or non-carbonated drink    . clobetasol cream (TEMOVATE) 0.05 %     . clonazePAM (KLONOPIN) 1 MG tablet Take 1 mg by mouth 2 (two) times daily.     . cloNIDine (CATAPRES) 0.1 MG tablet Take 1 tablet (0.1 mg total) by mouth at bedtime. 90 tablet 3  . cyanocobalamin (,VITAMIN B-12,) 1000 MCG/ML injection Inject 1 mL (1,000 mcg total) into the muscle every 30 (thirty) days. 12 mL 0  . Desoximetasone (TOPICORT) 0.25 % ointment Apply 1 application topically 2 (two) times daily. 30 g 1  . diphenhydrAMINE (BENADRYL) 25 MG tablet Take 25 mg by mouth at bedtime as needed for allergies or sleep.     Marland Kitchen EPINEPHrine 0.3 mg/0.3 mL IJ SOAJ injection SMARTSIG:0.3 Milliliter(s) IM Once PRN    . fluorometholone (FML) 0.1 % ophthalmic suspension Place 1 drop into both eyes 4 (four) times daily as needed  (itching).   1  . fluorouracil (EFUDEX) 5 % cream 1 application    . fluticasone (FLOVENT HFA) 44 MCG/ACT inhaler 2 puffs    . fluticasone furoate-vilanterol (BREO ELLIPTA) 100-25 MCG/INH AEPB Inhale 1 puff into the lungs daily. For wheezing suppression 1 each 3  . hydrocortisone (ANUSOL-HC) 25 MG suppository Place 1 suppository (25 mg total) rectally at bedtime as needed for hemorrhoids. 12 suppository 0  . hydrocortisone 2.5 % cream Apply 1 application topically 2 (two) times daily as needed (irritation). 30 g 0  . hyoscyamine (LEVSIN SL) 0.125 MG SL  tablet Place under the tongue.    Marland Kitchen ibuprofen (ADVIL,MOTRIN) 200 MG tablet Take 200-400 mg by mouth 3 (three) times daily as needed for moderate pain.     Marland Kitchen levETIRAcetam (KEPPRA) 250 MG tablet Take 250 mg by mouth 3 (three) times daily. 6 am, 12 pm, 6 pm    . levocetirizine (XYZAL) 5 MG tablet TAKE 1 TABLET BY MOUTH EVERY DAY AS NEEDED 30 tablet 0  . lipase/protease/amylase (CREON) 12000 units CPEP capsule Take 12,000 Units by mouth daily with breakfast.    . loratadine (CLARITIN) 10 MG tablet Take 10 mg by mouth daily.    Lita Mains (ZYLET) 0.5-0.3 % SUSP Administer 1 drop to the right eye daily as needed.    . metoprolol tartrate (LOPRESSOR) 25 MG tablet TAKE 1 TABLET (25 MG TOTAL) BY MOUTH 2 (TWO) TIMES DAILY. 180 tablet 3  . montelukast (SINGULAIR) 10 MG tablet Take 1 tablet (10 mg total) by mouth at bedtime. 30 tablet 5  . nystatin (MYCOSTATIN) 100000 UNIT/ML suspension Use as directed 5 mLs in the mouth or throat 3 (three) times daily as needed (thrush).    . Olopatadine HCl (PATADAY) 0.2 % SOLN Use one drop in each eye once daily as needed. 2.5 mL 5  . polyethylene glycol (MIRALAX / GLYCOLAX) packet Take 17 g by mouth at bedtime.    . potassium chloride SA (K-DUR,KLOR-CON) 20 MEQ tablet Take 1 tablet (20 mEq total) by mouth 2 (two) times daily. 60 tablet 3  . prednisoLONE acetate (PRED FORTE) 1 % ophthalmic suspension  SMARTSIG:In Eye(s)    . sodium chloride (OCEAN) 0.65 % SOLN nasal spray Place 1 spray into both nostrils as needed for congestion.     Marland Kitchen Spacer/Aero-Holding Chambers ([place]) MISC See admin instructions.    . Syringe/Needle, Disp, (SYRINGE 3CC/27GX1-1/4") 27G X 1-1/4" 3 ML MISC Inject 1 mL into the muscle every 30 (thirty) days. 12 each 0  . valACYclovir (VALTREX) 1000 MG tablet Take 1 tablet (1,000 mg total) by mouth 3 (three) times daily. For shingles 21 tablet 0  . Vitamin D, Ergocalciferol, (DRISDOL) 50000 units CAPS capsule TAKE 1 CAPSULE (50,000 UNITS TOTAL) BY MOUTH EVERY 7 (SEVEN) DAYS. (Patient taking differently: Take 50,000 Units by mouth See admin instructions. Take 50000 units by mouth every Mon, Wed, and Sat) 4 capsule 11   No current facility-administered medications for this visit.    Allergies  Allergen Reactions  . Bee Venom Swelling  . Doxycycline Hives  . Erythromycin Hives  . Gadavist [Gadobutrol] Hives, Shortness Of Breath and Itching  . Iodinated Diagnostic Agents Itching, Shortness Of Breath and Hives  . Other Hives    Cannot use  Cream or Suppositories  . Penicillins Hives    Did it involve swelling of the face/tongue/throat, SOB, or low BP? No Did it involve sudden or severe rash/hives, skin peeling, or any reaction on the inside of your mouth or nose? Yes Did you need to seek medical attention at a hospital or doctor's office? Yes When did it last happen?5+ years If all above answers are "NO", may proceed with cephalosporin use.   . Sulfa Antibiotics Hives  . Preparation H [Pramox-Pe-Glycerin-Petrolatum] Hives    Cannot use  Cream or Suppositories   . Sulfonamide Derivatives Hives  . Phenergan [Promethazine Hcl] Other (See Comments)    Causes restless leg syndrome    Review of Systems negative except from HPI and PMH  Physical Exam BP 120/70 (BP Location: Left Arm, Patient  Position: Sitting, Cuff Size: Normal)   Pulse 64   Ht 5\' 3"   (1.6 m)   Wt 151 lb (68.5 kg)   SpO2 98%   BMI 26.75 kg/m  Well developed and nourished in no acute distress HENT normal Neck supple with JVP-  flat   Clear Regular rate and rhythm, no murmurs or gallops Abd-soft with active BS No Clubbing cyanosis edema Skin-warm and dry; ruboric with poor capillary refill L worse than right, hands worse than feet  A & Oriented  Grossly normal sensory and motor function     Assessment and  Plan  Atrial  Tach  Pacemaker Biotronik  Normal function   Dysautonomia   Sinus node dysfunction   Raynaud's  Relatively infrequent tachycardia; she thinks this is becoming increasingly frequent.  Some lightheadedness with standing.  Need to work on hydration.  Report hands greater than feet left greater than right.  May well be Raynaud's.  A brief review on up-to-date failed to identify any clear triggering although stress can do this.  We will plan to discontinue her beta-blocker so as to hopefully promote beta mediated vasodilatation.  Adjunctive therapies include calcium blockers, phosphodiesterase inhibitors.  A lot of it is supportive management to prevent digital ischemia.  We will review with colleagues

## 2020-06-29 DIAGNOSIS — Z85828 Personal history of other malignant neoplasm of skin: Secondary | ICD-10-CM | POA: Diagnosis not present

## 2020-06-29 DIAGNOSIS — D040 Carcinoma in situ of skin of lip: Secondary | ICD-10-CM | POA: Diagnosis not present

## 2020-06-29 DIAGNOSIS — Z85820 Personal history of malignant melanoma of skin: Secondary | ICD-10-CM | POA: Diagnosis not present

## 2020-06-29 DIAGNOSIS — D485 Neoplasm of uncertain behavior of skin: Secondary | ICD-10-CM | POA: Diagnosis not present

## 2020-06-30 ENCOUNTER — Telehealth: Payer: Self-pay | Admitting: Pharmacist

## 2020-06-30 NOTE — Chronic Care Management (AMB) (Signed)
    Chronic Care Management Pharmacy Assistant   Name: [name]  MRN: [id number] DOB: 05/11/1955  Left voicemail reminding patient of appointment with Jeni Salles the clinical pharmacist on 07/01/20 at 1pm by telephone. Advised patient to have all medications and any blood pressure or blood glucose readings available.  [name] [phone]

## 2020-07-01 ENCOUNTER — Ambulatory Visit (INDEPENDENT_AMBULATORY_CARE_PROVIDER_SITE_OTHER): Payer: Medicare Other | Admitting: Pharmacist

## 2020-07-01 DIAGNOSIS — J4540 Moderate persistent asthma, uncomplicated: Secondary | ICD-10-CM

## 2020-07-01 DIAGNOSIS — I10 Essential (primary) hypertension: Secondary | ICD-10-CM

## 2020-07-01 NOTE — Progress Notes (Signed)
Chronic Care Management Pharmacy Note  07/24/2020 Name:  [name] MRN:  [id number] DOB:  [birth year]  Subjective: [name] is an 65 y.o. year old female who is a primary patient of Panosh, Standley Brooking, MD.  The CCM team was consulted for assistance with disease management and care coordination needs.    Engaged with patient by telephone for follow up visit in response to provider referral for pharmacy case management and/or care coordination services.   Consent to Services:  The patient was given information about Chronic Care Management services, agreed to services, and gave verbal consent prior to initiation of services.  Please see initial visit note for detailed documentation.   Patient Care Team: Panosh, Standley Brooking, MD as PCP - Lisa Roca, MD as Consulting Physician (Neurosurgery) [name], MD as Consulting Physician (Oncology) [name], MD as Referring Physician (Internal Medicine) Jodi Marble, MD as Consulting Physician (Otolaryngology) Danella Sensing, MD as Consulting Physician (Dermatology) Viona Gilmore, [facility] as Pharmacist (Pharmacist)  Recent office visits: 03/22/20 [name], LPN: Patient presented for AWV.  03/15/20 [name], MD: Patient presented for video visit for shingles. Prescribed Valtrex.  Recent consult visits: 05/31/20 Virl Axe, MD (cardiology): Patient presented for POTS. D/c'd metoprolol.   04/12/20 Chanetta Marshall, NP (cardiology): Patient presented for sinus node dysfunction.    02/08/20 Dianne Dun, MD (allergy): Unable to access notes.  01/07/20 Danella Sensing (dermatology): Unable to access notes.  [birth year] [name], MD (oncology): Patient presented for routine follow up.   Hospital visits: None in previous 6 months  Objective:  Lab Results  Component Value Date   CREATININE 1.10 (H) 05/19/2019   BUN 18.8 05/27/2015   GFR 85.78 05/24/2011   GFRNONAA 54 (L) 05/19/2019   GFRAA  >60 05/19/2019   NA 140 05/27/2015   K 4.1 05/27/2015   CALCIUM 9.8 05/27/2015   CO2 27 05/27/2015   GLUCOSE 89 05/27/2015    Lab Results  Component Value Date/Time   HGBA1C 5.9 07/15/2018 02:59 PM   HGBA1C 5.5 01/03/2006 11:18 AM   GFR 85.78 05/24/2011 11:08 AM   GFR 65.45 10/16/2010 02:11 PM    Last diabetic Eye exam: No results found for: HMDIABEYEEXA  Last diabetic Foot exam: No results found for: HMDIABFOOTEX   Lab Results  Component Value Date   CHOL 145 02/09/2014   HDL 47.90 02/09/2014   LDLCALC 73 02/09/2014   LDLDIRECT 109.7 10/11/2010   TRIG 123.0 02/09/2014   CHOLHDL 3 02/09/2014    Hepatic Function Latest Ref Rng & Units 05/27/2015 02/23/2015 01/05/2015  Total Protein 6.4 - 8.3 g/dL 8.0 8.2 7.8  Albumin 3.5 - 5.0 g/dL 4.4 4.7 4.5  AST 5 - 34 U/L 25 35(H) 26  ALT 0 - 55 U/L 33 42 28  Alk Phosphatase 40 - 150 U/L 127 157(H) 159(H)  Total Bilirubin 0.20 - 1.20 mg/dL 0.40 0.47 0.36  Bilirubin, Direct 0.0 - 0.3 mg/dL - - -    Lab Results  Component Value Date/Time   TSH 1.146 12/17/2013 01:09 PM   TSH 0.318 (L) 08/28/2013 05:40 PM   TSH 0.82 05/01/2007 11:38 AM    CBC Latest Ref Rng & Units 03/28/2018 05/27/2015 02/23/2015  WBC 4.0 - 10.5 K/uL 6.0 5.4 5.4  Hemoglobin 12.0 - 15.0 g/dL 13.5 14.4 14.8  Hematocrit 36.0 - 46.0 % 37.9 41.7 43.0  Platelets 150 - 400 K/uL 225 253 266    Lab Results  Component Value Date/Time  VD25OH 24 (L) 10/24/2009 08:23 PM   VD25OH 24 (L) 11/17/2008 07:53 PM    Clinical ASCVD: Yes  The ASCVD Risk score Mikey Bussing DC Jr., et al., 2013) failed to calculate for the following reasons:   The patient has a prior MI or stroke diagnosis    Depression screen [facility] 2/9 03/22/2020 03/15/2020  Decreased Interest 0 0  Down, Depressed, Hopeless 0 0  PHQ - 2 Score 0 0  Some recent data might be hidden      Social History   Tobacco Use  Smoking Status Never Smoker  Smokeless Tobacco Never Used   BP Readings from Last 3 Encounters:   05/31/20 120/70  04/12/20 118/84  09/07/19 118/78   Pulse Readings from Last 3 Encounters:  05/31/20 64  04/12/20 66  09/07/19 61   Wt Readings from Last 3 Encounters:  05/31/20 151 lb (68.5 kg)  04/12/20 156 lb (70.8 kg)  09/07/19 156 lb 6.4 oz (70.9 kg)   BMI Readings from Last 3 Encounters:  05/31/20 26.75 kg/m  04/12/20 26.95 kg/m  09/07/19 27.01 kg/m    Assessment/Interventions: Review of patient past medical history, allergies, medications, health status, including review of consultants reports, laboratory and other test data, was performed as part of comprehensive evaluation and provision of chronic care management services.   SDOH:  (Social Determinants of Health) assessments and interventions performed: No  SDOH Screenings   Alcohol Screen: Not on file  Depression (PHQ2-9): Low Risk   . PHQ-2 Score: 0  Financial Resource Strain: Low Risk   . Difficulty of Paying Living Expenses: Not hard at all  Food Insecurity: No Food Insecurity  . Worried About Charity fundraiser in the Last Year: Never true  . Ran Out of Food in the Last Year: Never true  Housing: Low Risk   . Last Housing Risk Score: 0  Physical Activity: Sufficiently Active  . Days of Exercise per Week: 5 days  . Minutes of Exercise per Session: 30 min  Social Connections: Unknown  . Frequency of Communication with Friends and Family: More than three times a week  . Frequency of Social Gatherings with Friends and Family: Once a week  . Attends Religious Services: Not on file  . Active Member of Clubs or Organizations: No  . Attends Archivist Meetings: Never  . Marital Status: Married  Stress: Stress Concern Present  . Feeling of Stress : To some extent  Tobacco Use: Low Risk   . Smoking Tobacco Use: Never Smoker  . Smokeless Tobacco Use: Never Used  Transportation Needs: No Transportation Needs  . Lack of Transportation (Medical): No  . Lack of Transportation (Non-Medical): No    Patient is weaning off dose of metoprolol and clonidine. Patient was on metoprolol 4 times a day - back to 4 times a day  Amlodipine for Raynauds -   Clonidine 0.1 mg twice daily -  Checking BP at least twice -  Highest 128-132/93, heart rate is in the 90s     CCM Care Plan  Allergies  Allergen Reactions  . Bee Venom Swelling  . Doxycycline Hives  . Erythromycin Hives  . Gadavist [Gadobutrol] Hives, Shortness Of Breath and Itching  . Iodinated Diagnostic Agents Itching, Shortness Of Breath and Hives  . Other Hives    Cannot use  Cream or Suppositories  . Penicillins Hives    Did it involve swelling of the face/tongue/throat, SOB, or low BP? No Did it involve sudden or severe  rash/hives, skin peeling, or any reaction on the inside of your mouth or nose? Yes Did you need to seek medical attention at a hospital or doctor's office? Yes When did it last happen?5+ years If all above answers are "NO", may proceed with cephalosporin use.   . Sulfa Antibiotics Hives  . Preparation H [Pramox-Pe-Glycerin-Petrolatum] Hives    Cannot use  Cream or Suppositories   . Sulfonamide Derivatives Hives  . Phenergan [Promethazine Hcl] Other (See Comments)    Causes restless leg syndrome    Medications Reviewed Today    Reviewed by Anderson Malta, CMA (Certified Medical Assistant) on 07/22/20 at 1400  Med List Status: <None>  Medication Order Taking? Sig Documenting Provider Last Dose Status Informant  acetaminophen (TYLENOL) 500 MG tablet [id number]  Take 500 mg by mouth at bedtime. [provider]  Active Self           Med Note [facility] MENDEZ, CARLOS A   Tue May 31, 2020  2:15 PM)    albuterol (PROVENTIL) (2.5 MG/3ML) 0.083% nebulizer solution [id number]  Take 3 mLs (2.5 mg total) by nebulization every 4 (four) hours as needed for wheezing or shortness of breath. Bobbitt, Sedalia Muta, MD  Active   albuterol (VENTOLIN HFA) 108 (90 Base) MCG/ACT inhaler [id number]  INHALE 2  PUFFS INTO THE LUNGS EVERY 4 (FOUR) HOURS AS NEEDED FOR WHEEZING OR SHORTNESS OF BREATH. Panosh, Standley Brooking, MD  Active   augmented betamethasone dipropionate (DIPROLENE) 0.05 % ointment [id number]  Apply topically 2 (two) times daily. Limit to no longer than 2 weeks use. Not on face. Panosh, Standley Brooking, MD  Active   bimatoprost (LATISSE) 0.03 % ophthalmic solution [id number]  APPLY 1 DROP IN BOTH EYES ONCE A DAY [provider]  Active   calcium carbonate (TUMS - DOSED IN MG ELEMENTAL CALCIUM) 500 MG chewable tablet [id number]  Chew 1 tablet by mouth daily as needed for indigestion or heartburn.  [provider]  Active Self  CALCIUM PO [id number]  Take 1 tablet by mouth daily. [provider]  Active Self  cholestyramine (QUESTRAN) 4 g packet [id number]  1 packet mixed with water or non-carbonated drink [provider]  Active   clobetasol cream (TEMOVATE) 0.05 % [id number]   [provider]  Active   clonazePAM (KLONOPIN) 1 MG tablet [id number]  Take 1 mg by mouth 2 (two) times daily.  [provider]  Active Self  cloNIDine (CATAPRES) 0.1 MG tablet [id number]  Take 1 tablet (0.1 mg total) by mouth at bedtime. Deboraha Sprang, MD  Active   Desoximetasone (TOPICORT) 0.25 % ointment [id number]  Apply 1 application topically 2 (two) times daily. Panosh, Standley Brooking, MD  Active   diphenhydrAMINE (BENADRYL) 25 MG tablet [id number]  Take 25 mg by mouth at bedtime as needed for allergies or sleep.  [provider]  Active Self  EPINEPHrine 0.3 mg/0.3 mL IJ SOAJ injection [id number]  SMARTSIG:0.3 Milliliter(s) IM Once PRN [provider]  Active   fluorometholone (FML) 0.1 % ophthalmic suspension [id number]  Place 1 drop into both eyes 4 (four) times daily as needed (itching).  [provider]  Active Self  fluorouracil (EFUDEX) 5 % cream [id number]  1 application [provider]  Active   fluticasone (FLOVENT HFA) 44 MCG/ACT inhaler  [id number]  2 puffs [provider]  Active   hydrocortisone (ANUSOL-HC) 25 MG suppository [id number]  Place 1 suppository (25 mg total) rectally at bedtime as needed for hemorrhoids. Benay Spice,  Izola Price, MD  Active Self  hydrocortisone 2.5 % cream [id number]  Apply 1 application topically 2 (two) times daily as needed (irritation). [name], MD  Active Self  hyoscyamine (LEVSIN SL) 0.125 MG SL tablet [id number]  Place under the tongue. [provider]  Active   ibuprofen (ADVIL,MOTRIN) 200 MG tablet [id number]  Take 200-400 mg by mouth 3 (three) times daily as needed for moderate pain.  [provider]  Active Self  levETIRAcetam (KEPPRA) 250 MG tablet [id number]  Take 250 mg by mouth 3 (three) times daily. 6 am, 12 pm, 6 pm [provider]  Active Self  levocetirizine (XYZAL) 5 MG tablet [id number]  TAKE 1 TABLET BY MOUTH EVERY DAY AS NEEDED Bobbitt, Sedalia Muta, MD  Active   lipase/protease/amylase (CREON) 12000 units CPEP capsule [id number]  Take 12,000 Units by mouth daily with breakfast. [provider]  Active Self           Med Note [facility] MENDEZ, CARLOS A   Tue May 31, 2020  2:15 PM)    loratadine (CLARITIN) 10 MG tablet [id number]  Take 10 mg by mouth daily. [provider]  Active            Med Note [facility], CARLOS A   Tue May 31, 2020  2:15 PM)    Loteprednol-Tobramycin (ZYLET) 0.5-0.3 % SUSP [id number]  Administer 1 drop to the right eye daily as needed. [provider]  Active   montelukast (SINGULAIR) 10 MG tablet [id number]  Take 1 tablet (10 mg total) by mouth at bedtime. Bobbitt, Sedalia Muta, MD  Active   nystatin (MYCOSTATIN) 100000 UNIT/ML suspension [id number]  Use as directed 5 mLs in the mouth or throat 3 (three) times daily as needed (thrush). [provider]  Active Self  Olopatadine HCl (PATADAY) 0.2 % SOLN [id number]  Use one drop in each eye once daily as needed. Bobbitt, Sedalia Muta, MD  Active    polyethylene glycol [facility] / GLYCOLAX) packet [id number]  Take 17 g by mouth at bedtime. [provider]  Active Self  potassium chloride SA (K-DUR,KLOR-CON) 20 MEQ tablet [id number]  Take 1 tablet (20 mEq total) by mouth 2 (two) times daily. Owens Shark, NP  Active Self  prednisoLONE acetate (PRED FORTE) 1 % ophthalmic suspension [id number]  SMARTSIG:In Eye(s) [provider]  Active   sodium chloride (OCEAN) 0.65 % SOLN nasal spray [id number]  Place 1 spray into both nostrils as needed for congestion.  [provider]  Active Self  Spacer/Aero-Holding Josiah Lobo ([place]) [place] [id number]  See admin instructions. [provider]  Active   Syringe/Needle, Disp, (SYRINGE 3CC/27GX1-1/4") 27G X 1-1/4" 3 ML MISC [id number]  Inject 1 mL into the muscle every 30 (thirty) days. Deboraha Sprang, MD  Active Self  valACYclovir (VALTREX) 1000 MG tablet [id number]  Take 1 tablet (1,000 mg total) by mouth 3 (three) times daily. For shingles Panosh, Standley Brooking, MD  Active   Vitamin D, Ergocalciferol, (DRISDOL) 50000 units CAPS capsule [id number]  TAKE 1 CAPSULE (50,000 UNITS TOTAL) BY MOUTH EVERY 7 (SEVEN) DAYS.  Patient taking differently: Take 50,000 Units by mouth See admin instructions. Take 50000 units by mouth every Mon, Wed, and Sat   Deboraha Sprang, MD  Active Self          Patient Active Problem List   Diagnosis Date Noted  . Moderate persistent asthma 12/08/2018  . Perennial allergic rhinitis with predominantly nonallergic component 12/08/2018  .  Allergic conjunctivitis 12/08/2018  . History of epistaxis 12/08/2018  . Atopic dermatitis 12/08/2018  . Recurrent urticaria 12/08/2018  . Skeeter syndrome 12/08/2018  . S/P laparoscopic cholecystectomy 03/28/2018  . Dehydration 05/18/2016  . History of pacemaker   . POTS (postural orthostatic tachycardia syndrome)   . Iron deficiency anemia 11/11/2014  . Chronic colitis 08/28/2013  . Malignant melanoma,  metastatic ([place]) 02/09/2013  . Malignant melanoma ([place]) 12/01/2012  . Brain metastasis ([place]) 10/16/2012  . Hypertension 09/30/2012  . Anxiety state, unspecified 09/30/2012  . Hypoxemia 09/30/2012  . Dysautonomia ([place]) 02/25/2011  . Anticoagulant long-term use 01/19/2011  . Neuritis 01/19/2011  . Skin change 01/19/2011  . CVA (cerebral infarction)   . Palpitation 06/28/2010  . Pacemaker-Biotronik 06/28/2010  . Dizziness and giddiness 04/27/2010  . DIVERTICULOSIS, COLON, HX OF 01/19/2009  . CAROTID SINUS SYNDROME 11/17/2008  . OTHER MALAISE AND FATIGUE 11/17/2008  . HYPERTENSION, BENIGN 11/02/2008  . ATRIAL TACHYCARDIA 09/16/2008  . SYNCOPE, HX OF 08/09/2008  . INJURY UNSPEC NERVE SHOULDER GIRDLE&UPPER LIMB 07/12/2008  . PERSONAL HISTORY OF MALIGNANT MELANOMA OF SKIN 07/12/2008  . LUQ PAIN 12/08/2007  . NECK PAIN 09/26/2007  . NUMBNESS, ARM 09/26/2007  . CPK, ABNORMAL 09/26/2007  . ARM PAIN, LEFT 06/25/2007  . SWELLING OF LIMB 06/25/2007  . OSTEOPENIA 06/25/2007    Immunization History  Administered Date(s) Administered  . Influenza Split 03/03/2012  . PFIZER(Purple Top)SARS-COV-2 Vaccination 05/01/2019  . PPD Test 07/01/2013  . Pneumococcal Polysaccharide-23 10/03/2012   Patient's biggest concern is her blood pressure as she weans off of clonidine and metoprolol. Patient is still taking both as her BP has spiked since trying to taper down. Recommended for her to follow up with Dr. Caryl Comes to discuss next steps and denies any dizziness/lightheadedness.   Conditions to be addressed/monitored:  Hypertension, GERD, Asthma, Anxiety, Osteopenia, Allergic Rhinitis and Seizures, Vitamin B12 deficiency, Vitamin D deficiency  Care Plan : CCM Pharmacy Care Plan  Updates made by Viona Gilmore, [place] since 07/24/2020 12:00 AM    Problem: Problem: Hypertension, GERD, Asthma, Anxiety, Osteopenia, Allergic Rhinitis and Seizures, Vitamin B12 deficiency, Vitamin D deficiency      Long-Range Goal: Patient-Specific Goal   Start Date: 07/01/2020  Expected End Date: 07/01/2021  This Visit's Progress: On track  Priority: High  Note:   Current Barriers:  . Unable to independently monitor therapeutic efficacy . Unable to achieve control of blood pressure during taper   Pharmacist Clinical Goal(s):  Marland Kitchen Patient will achieve adherence to monitoring guidelines and medication adherence to achieve therapeutic efficacy . achieve control of blood pressure as evidenced by home readings  through collaboration with PharmD and provider.   Interventions: . 1:1 collaboration with Panosh, Standley Brooking, MD regarding development and update of comprehensive plan of care as evidenced by provider attestation and co-signature . Inter-disciplinary care team collaboration (see longitudinal plan of care) . Comprehensive medication review performed; medication list updated in electronic medical record  Hypertension (BP goal <130/80) -Not ideally controlled -Current treatment:  Clonidine 0.57m, 1 tablet at bedtime  Metoprolol tartrate 25 mg daily -Medications previously tried: n/a  -Current home readings: BP has been elevated since tapering off clonidine and metoprolol -Current dietary habits: did not discuss -Current exercise habits: did not discuss -Denies hypotensive/hypertensive symptoms -Educated on Importance of home blood pressure monitoring; Proper BP monitoring technique; -Counseled to monitor BP at home daily, document, and provide log at future appointments -Counseled on diet and exercise extensively Recommended to continue current medication Recommended for  patient to reach out to Dr. Caryl Comes to discuss next steps with taper.  Asthma (Goal: control symptoms) -Controlled -Current treatment   Albuterol 0.083% nebulizer solution, 3 MLs every four hours as needed for wheezing or shortness of breath  Albuterol HFA 145mg/act inhaler, 2 puffs every hours as needed for wheezing or  shortness of breath  Advair 230/21 inhale once daily -Medications previously tried: n/a  -Exacerbations requiring treatment in last 6 months: none -Patient reports consistent use of maintenance inhaler -Frequency of rescue inhaler use: none in last 6 months -Counseled on Proper inhaler technique; Benefits of consistent maintenance inhaler use When to use rescue inhaler Differences between maintenance and rescue inhalers -Recommended to continue current medication  Depression/Anxiety (Goal: minimize symptoms) -Controlled -Current treatment: . Clonazepam 185m 1 tablet twice daily -Medications previously tried/failed: n/a -PHQ9: n/a -GAD7: n/a -Educated on Benefits of medication for symptom control -Recommended to continue current medication   Osteopenia (Goal prevent fractures) -Controlled -Last DEXA Scan: 07/31/19  T-Score femoral neck: -2.0  T-Score total hip: n/a  T-Score lumbar spine: -2.0  T-Score forearm radius: n/a  10-year probability of major osteoporotic fracture: unknown  10-year probability of hip fracture: unknown -Patient is not a candidate for pharmacologic treatment -Current treatment   Vitamin D3 50,000 units, 1 capsule once a week  Calcium 500 mg, 1 tablet once daily  -Medications previously tried: n/a  -Recommend [id number] units of vitamin D daily. Recommend 1200 mg of calcium daily from dietary and supplemental sources. Recommend weight-bearing and muscle strengthening exercises for building and maintaining bone density. -Recommended to continue current medication  GERD/Heartburn (Goal: minimize symptoms) -Controlled -Current treatment  . Calcium carbonate (Tums) 50046mchew 1 tablet daily as needed for indigestion or heartburn -Medications previously tried: none  -Recommended to continue current medication  Colitis (Goal: minimize symptoms) -Controlled -Current treatment  . Lipase/ protease/ amylase (Creon) 12,000 units, 1 capsule once daily with  breakfast  -Medications previously tried: none  -Recommended to continue current medication  Atopic dermatitis (Goal: minimize symptoms) -Not ideally controlled -Current treatment   Betamethasone dipropionate 0.05% ointment, apply two times daily. Limit to no longer than 2 weeks use. Not on face.  Desoximetasone 0.27.78%ntment, 1 application twice daily  Diphenhydramine (Benadryl) 31m5m tablet at bedtime as needed  Epipen as needed -Medications previously tried: n/a  -Recommended discussing results with Dr. PanoRegis Billpatient reports she underwent extensive allergy testing. Scheduled PCP appointment to discuss.  Allergic rhinitis (Goal: minimize symptoms) -Controlled -Current treatment   Fluticasone (Flonase) 50mc37mt nasal spray, 2 sprays into both nostrils daily as needed   levocetirizine (Xyzal) 5mg, 49mablet everday as needed  Sodium chloride 0.65% nasal spray solution, 1 spray into both nostrils as needed for congesion (uses 3 to 4 x/ day)   Olopatadine (Pataday) 0.2% solution, 1 drop in each eye once daily as needed -Medications previously tried: montelukast (lethargy), claritin (ineffective)   -Recommended to continue current medication Counseled on duplication of therapy with Benadryl, Claritin and Xyzal and patient stopped using Claritin due to lack of efficacy.  Seizures (Goal: prevent seizures) -Controlled -Current treatment  . levetiracetam 250mg, 72mblet three times daily -Medications previously tried: none  -Recommended to continue current medication  Thrush (Goal: minimize symptoms) -Controlled -Current treatment  . nystatin 100000 unit/ ml, 5 MLs three times daily as needed thrush -Medications previously tried: none  -Recommended to continue current medication  Pain (Goal: minimize pain) -Controlled -Current treatment   Ibuprofen 200mg, 274m00mg thr84mimes daily as  needed for moderate pain  Acetaminophen 586m, 1 tablet at  bedtime -Medications previously tried: n/a  -  Patient had fractured her toe and they saw arthritis on her x-ray; suggested trying a topical product such as Voltaren gel but she prefers not to and has had success with yunkers ointment from medical supply   Vitamin B12 deficiency (Goal: 211-911) -Controlled -Current treatment  . Cyanocobalamin 1000 mcg SL 1 tablet daily -Medications previously tried: none  -Recommended to continue current medication   Health Maintenance -Vaccine gaps: shingles, COVID, tetanus -Current therapy:   Hydrocortisone 225msuppository, place 1 rectally at bedtime as needed for hemorrhoids  Potassium chloride 2054m 1 tablet twice daily -Educated on Cost vs benefit of each product must be carefully weighed by individual consumer -Patient is satisfied with current therapy and denies issues -Recommended to continue current medication  Patient Goals/Self-Care Activities . Patient will:  - take medications as prescribed check blood pressure daily, document, and provide at future appointments target a minimum of 150 minutes of moderate intensity exercise weekly  Follow Up Plan: Telephone follow up appointment with care management team member scheduled for: 3 months       Medication Assistance: None required.  Patient affirms current coverage meets needs.  [facility] [address] [place] [id number]: [phone]: [address] [place]one: [phone]: [id number] [place] [place]one: [phone]: [phone] [address] [place] [place]one: [phone]: [phone] pill box? Yes Pt endorses 100% compliance  We discussed: Current pharmacy is preferred with insurance plan and patient is satisfied with pharmacy services Patient decided to: Continue current medication management strategy  Care Plan and Follow Up Patient Decision:  Patient agrees to Care Plan and Follow-up.  Plan: Telephone follow up appointment with care management team member scheduled for:  3 months  MadJeni SallesharmD BCAFarmersburgarmacist LeBMentone [phone]

## 2020-07-12 ENCOUNTER — Other Ambulatory Visit: Payer: Self-pay | Admitting: Internal Medicine

## 2020-07-14 DIAGNOSIS — Z9289 Personal history of other medical treatment: Secondary | ICD-10-CM | POA: Diagnosis not present

## 2020-07-14 DIAGNOSIS — R509 Fever, unspecified: Secondary | ICD-10-CM | POA: Diagnosis not present

## 2020-07-14 DIAGNOSIS — C7800 Secondary malignant neoplasm of unspecified lung: Secondary | ICD-10-CM | POA: Diagnosis not present

## 2020-07-14 DIAGNOSIS — Z950 Presence of cardiac pacemaker: Secondary | ICD-10-CM | POA: Diagnosis not present

## 2020-07-14 DIAGNOSIS — R21 Rash and other nonspecific skin eruption: Secondary | ICD-10-CM | POA: Diagnosis not present

## 2020-07-14 DIAGNOSIS — I7300 Raynaud's syndrome without gangrene: Secondary | ICD-10-CM | POA: Diagnosis not present

## 2020-07-14 DIAGNOSIS — Z800 Family history of malignant neoplasm of digestive organs: Secondary | ICD-10-CM | POA: Diagnosis not present

## 2020-07-14 DIAGNOSIS — K5730 Diverticulosis of large intestine without perforation or abscess without bleeding: Secondary | ICD-10-CM | POA: Diagnosis not present

## 2020-07-14 DIAGNOSIS — C4359 Malignant melanoma of other part of trunk: Secondary | ICD-10-CM | POA: Diagnosis not present

## 2020-07-14 DIAGNOSIS — C4490 Unspecified malignant neoplasm of skin, unspecified: Secondary | ICD-10-CM | POA: Diagnosis not present

## 2020-07-14 DIAGNOSIS — C7931 Secondary malignant neoplasm of brain: Secondary | ICD-10-CM | POA: Diagnosis not present

## 2020-07-14 DIAGNOSIS — K769 Liver disease, unspecified: Secondary | ICD-10-CM | POA: Diagnosis not present

## 2020-07-14 DIAGNOSIS — K580 Irritable bowel syndrome with diarrhea: Secondary | ICD-10-CM | POA: Diagnosis not present

## 2020-07-14 DIAGNOSIS — K449 Diaphragmatic hernia without obstruction or gangrene: Secondary | ICD-10-CM | POA: Diagnosis not present

## 2020-07-14 DIAGNOSIS — C787 Secondary malignant neoplasm of liver and intrahepatic bile duct: Secondary | ICD-10-CM | POA: Diagnosis not present

## 2020-07-14 DIAGNOSIS — C439 Malignant melanoma of skin, unspecified: Secondary | ICD-10-CM | POA: Diagnosis not present

## 2020-07-14 DIAGNOSIS — R918 Other nonspecific abnormal finding of lung field: Secondary | ICD-10-CM | POA: Diagnosis not present

## 2020-07-14 DIAGNOSIS — I4891 Unspecified atrial fibrillation: Secondary | ICD-10-CM | POA: Diagnosis not present

## 2020-07-22 ENCOUNTER — Telehealth: Payer: Medicare Other | Admitting: Family Medicine

## 2020-07-22 NOTE — Progress Notes (Signed)
Patient checked in then immediately checked out for video visit.  This provider remained online awaiting pt without pt's returning.

## 2020-07-24 NOTE — Patient Instructions (Addendum)
Hi [name],  It was great to get to speak with you again! Below is a summary of some of the topics we discussed. Don't forget to reach out to Dr. Caryl Comes about your taper plan.  Please reach out to me if you have any questions or need anything before our follow up!  Best, [name]  Jeni Salles, PharmD, [facility]  Visit Information  Goals Addressed   None    Patient Care Plan: CCM Pharmacy Care Plan    Problem Identified: Problem: Hypertension, GERD, Asthma, Anxiety, Osteopenia, Allergic Rhinitis and Seizures, Vitamin B12 deficiency, Vitamin D deficiency     Long-Range Goal: Patient-Specific Goal   Start Date: 07/01/2020  Expected End Date: 07/01/2021  This Visit's Progress: On track  Priority: High  Note:   Current Barriers:  . Unable to independently monitor therapeutic efficacy . Unable to achieve control of blood pressure during taper   Pharmacist Clinical Goal(s):  Marland Kitchen Patient will achieve adherence to monitoring guidelines and medication adherence to achieve therapeutic efficacy . achieve control of blood pressure as evidenced by home readings  through collaboration with PharmD and provider.   Interventions: . 1:1 collaboration with Panosh, Standley Brooking, MD regarding development and update of comprehensive plan of care as evidenced by provider attestation and co-signature . Inter-disciplinary care team collaboration (see longitudinal plan of care) . Comprehensive medication review performed; medication list updated in electronic medical record  Hypertension (BP goal <130/80) -Not ideally controlled -Current treatment:  Clonidine 0.1mg , 1 tablet at bedtime  Metoprolol tartrate 25 mg daily -Medications previously tried: n/a  -Current home readings: BP has been elevated since tapering off clonidine and metoprolol -Current dietary habits: did not discuss -Current exercise habits: did not discuss -Denies  hypotensive/hypertensive symptoms -Educated on Importance of home blood pressure monitoring; Proper BP monitoring technique; -Counseled to monitor BP at home daily, document, and provide log at future appointments -Counseled on diet and exercise extensively Recommended to continue current medication Recommended for patient to reach out to Dr. Caryl Comes to discuss next steps with taper.  Asthma (Goal: control symptoms) -Controlled -Current treatment   Albuterol 0.083% nebulizer solution, 3 MLs every four hours as needed for wheezing or shortness of breath  Albuterol HFA 126mcg/act inhaler, 2 puffs every hours as needed for wheezing or shortness of breath  Advair 230/21 inhale once daily -Medications previously tried: n/a  -Exacerbations requiring treatment in last 6 months: none -Patient reports consistent use of maintenance inhaler -Frequency of rescue inhaler use: none in last 6 months -Counseled on Proper inhaler technique; Benefits of consistent maintenance inhaler use When to use rescue inhaler Differences between maintenance and rescue inhalers -Recommended to continue current medication  Depression/Anxiety (Goal: minimize symptoms) -Controlled -Current treatment: . Clonazepam 1mg , 1 tablet twice daily -Medications previously tried/failed: n/a -PHQ9: n/a -GAD7: n/a -Educated on Benefits of medication for symptom control -Recommended to continue current medication   Osteopenia (Goal prevent fractures) -Controlled -Last DEXA Scan: 07/31/19  T-Score femoral neck: -2.0  T-Score total hip: n/a  T-Score lumbar spine: -2.0  T-Score forearm radius: n/a  10-year probability of major osteoporotic fracture: unknown  10-year probability of hip fracture: unknown -Patient is not a candidate for pharmacologic treatment -Current treatment   Vitamin D3 50,000 units, 1 capsule once a week  Calcium 500 mg, 1 tablet once daily  -Medications previously tried: n/a  -Recommend [id number]  units of vitamin D daily. Recommend 1200 mg of calcium daily from dietary and supplemental sources.  Recommend weight-bearing and muscle strengthening exercises for building and maintaining bone density. -Recommended to continue current medication  GERD/Heartburn (Goal: minimize symptoms) -Controlled -Current treatment  . Calcium carbonate (Tums) 500mg , chew 1 tablet daily as needed for indigestion or heartburn -Medications previously tried: none  -Recommended to continue current medication  Colitis (Goal: minimize symptoms) -Controlled -Current treatment  . Lipase/ protease/ amylase (Creon) 12,000 units, 1 capsule once daily with breakfast  -Medications previously tried: none  -Recommended to continue current medication  Atopic dermatitis (Goal: minimize symptoms) -Not ideally controlled -Current treatment   Betamethasone dipropionate 0.05% ointment, apply two times daily. Limit to no longer than 2 weeks use. Not on face.  Desoximetasone 6.78% ointment, 1 application twice daily  Diphenhydramine (Benadryl) 25mg , 1 tablet at bedtime as needed  Epipen as needed -Medications previously tried: n/a  -Recommended discussing results with Dr. Regis Bill as patient reports she underwent extensive allergy testing. Scheduled PCP appointment to discuss.  Allergic rhinitis (Goal: minimize symptoms) -Controlled -Current treatment   Fluticasone (Flonase) 27mcg/act nasal spray, 2 sprays into both nostrils daily as needed   levocetirizine (Xyzal) 5mg , 1 tablet everday as needed  Sodium chloride 0.65% nasal spray solution, 1 spray into both nostrils as needed for congesion (uses 3 to 4 x/ day)   Olopatadine (Pataday) 0.2% solution, 1 drop in each eye once daily as needed -Medications previously tried: montelukast (lethargy), claritin (ineffective)   -Recommended to continue current medication Counseled on duplication of therapy with Benadryl, Claritin and Xyzal and patient stopped using  Claritin due to lack of efficacy.  Seizures (Goal: prevent seizures) -Controlled -Current treatment  . levetiracetam 250mg , 1 tablet three times daily -Medications previously tried: none  -Recommended to continue current medication  Thrush (Goal: minimize symptoms) -Controlled -Current treatment  . nystatin 100000 unit/ ml, 5 MLs three times daily as needed thrush -Medications previously tried: none  -Recommended to continue current medication  Pain (Goal: minimize pain) -Controlled -Current treatment   Ibuprofen 200mg , 200-400mg  three times daily as needed for moderate pain  Acetaminophen 500mg , 1 tablet at bedtime -Medications previously tried: n/a  -  Patient had fractured her toe and they saw arthritis on her x-ray; suggested trying a topical product such as Voltaren gel but she prefers not to and has had success with yunkers ointment from medical supply   Vitamin B12 deficiency (Goal: 211-911) -Controlled -Current treatment  . Cyanocobalamin 1000 mcg SL 1 tablet daily -Medications previously tried: none  -Recommended to continue current medication   Health Maintenance -Vaccine gaps: shingles, COVID, tetanus -Current therapy:   Hydrocortisone 25mg  suppository, place 1 rectally at bedtime as needed for hemorrhoids  Potassium chloride 13mEq, 1 tablet twice daily -Educated on Cost vs benefit of each product must be carefully weighed by individual consumer -Patient is satisfied with current therapy and denies issues -Recommended to continue current medication  Patient Goals/Self-Care Activities . Patient will:  - take medications as prescribed check blood pressure daily, document, and provide at future appointments target a minimum of 150 minutes of moderate intensity exercise weekly  Follow Up Plan: Telephone follow up appointment with care management team member scheduled for: 3 months       Patient verbalizes understanding of instructions provided today and  agrees to view in [place].  Telephone follow up appointment with pharmacy team member scheduled for: 3 months  Viona Gilmore, [facility]ocate

## 2020-07-26 ENCOUNTER — Other Ambulatory Visit: Payer: Self-pay

## 2020-07-27 ENCOUNTER — Encounter: Payer: Self-pay | Admitting: Internal Medicine

## 2020-07-27 ENCOUNTER — Ambulatory Visit (INDEPENDENT_AMBULATORY_CARE_PROVIDER_SITE_OTHER): Payer: Medicare Other | Admitting: Internal Medicine

## 2020-07-27 VITALS — BP 120/70 | HR 71 | Temp 98.0°F | Ht 63.0 in | Wt 153.4 lb

## 2020-07-27 DIAGNOSIS — R1310 Dysphagia, unspecified: Secondary | ICD-10-CM

## 2020-07-27 DIAGNOSIS — J3089 Other allergic rhinitis: Secondary | ICD-10-CM | POA: Diagnosis not present

## 2020-07-27 DIAGNOSIS — K5790 Diverticulosis of intestine, part unspecified, without perforation or abscess without bleeding: Secondary | ICD-10-CM

## 2020-07-27 DIAGNOSIS — J4540 Moderate persistent asthma, uncomplicated: Secondary | ICD-10-CM

## 2020-07-27 DIAGNOSIS — C799 Secondary malignant neoplasm of unspecified site: Secondary | ICD-10-CM

## 2020-07-27 DIAGNOSIS — Z889 Allergy status to unspecified drugs, medicaments and biological substances status: Secondary | ICD-10-CM | POA: Diagnosis not present

## 2020-07-27 DIAGNOSIS — I498 Other specified cardiac arrhythmias: Secondary | ICD-10-CM

## 2020-07-27 DIAGNOSIS — Z950 Presence of cardiac pacemaker: Secondary | ICD-10-CM | POA: Diagnosis not present

## 2020-07-27 DIAGNOSIS — C439 Malignant melanoma of skin, unspecified: Secondary | ICD-10-CM

## 2020-07-27 DIAGNOSIS — K449 Diaphragmatic hernia without obstruction or gangrene: Secondary | ICD-10-CM

## 2020-07-27 DIAGNOSIS — B370 Candidal stomatitis: Secondary | ICD-10-CM | POA: Diagnosis not present

## 2020-07-27 DIAGNOSIS — Z789 Other specified health status: Secondary | ICD-10-CM | POA: Diagnosis not present

## 2020-07-27 DIAGNOSIS — G90A Postural orthostatic tachycardia syndrome (POTS): Secondary | ICD-10-CM

## 2020-07-27 MED ORDER — NYSTATIN 100000 UNIT/ML MT SUSP: 5.0000 mL | Freq: Three times a day (TID) | OROMUCOSAL | 2 refills | Status: DC | PRN

## 2020-07-27 NOTE — Progress Notes (Signed)
Chief Complaint  Patient presents with  . Follow-up    HPI: [name] 65 y.o. come in with has been for concerns time for checkup but has a number of issues. She remains the only survivor in her home court for her immunotherapy at the time experimental for her metastatic melanoma.  Most recent scans without contrast because of supply issue but was clear. Her specialist states that her diverticulosis disease and hiatal hernia and GI symptoms at this time would preclude her from any new medication if her melanoma recurs.    Diverticulosis disease  And medium size hiatal hernia.   Were seen on CT but no other significant abnormalities.  Does not appear that she has had recurrent diverticulitis but was under the impression that should maybe have surgery in that regard..   June 15  To see dr Avelina Laine  Surgery   Question about GERD symptoms she does have heartburn that has been treated with Tums at night that began when she had her initial therapy because she was not allowed to be on another medication.  Since that time she has had some dysphagia with sometimes pill or food getting stuck and has to drink a good bit of water.  She does tend to have nocturnal GERD symptoms.  Not been on a PPI and has never had an endoscopy.  She has what she calls IBS symptoms.  Feels that it was secondary to her immunotherapy.   Thrush in mouth    When stressed and  Out of nystatin.  Her oncologist has been giving it to her as she is using as needed and it is quite helpful even after the first few days.  Asks if we can take over this medicine to have as needed.   Dr Harold Hedge  Allergist    has seen her and did change her inhaled steroid she takes as needed when flares.  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  Asthma : Stable sometimes  snoring  At night .   He has residual rash left over from her reaction to the COVID-vaccine noted on her right lower  extremity  She has some feet and toe issues and is trying to maintain or increase her parents muscular skeletal general health and upper body strains to maximize her physical status in case or when melanoma recurs.  Reports some color changes with tips of ears and hands under cold environment states that Dr. Caryl Comes said she may have her nodes but no cardiovascular evidence of a problem.  ROS: See pertinent positives and negatives per HPI.  Past Medical History:  Diagnosis Date  . Angio-edema   . Arthritis   . Asthma   . Atrial fibrillation ([place])   . Atrial tachycardia ([place])   . Cervicalgia   . Complication of anesthesia    pt states wakes up with "shakes"  . CVA (cerebral infarction)    2012 with dizziness and vision change felt embolic from atrial tachy  . Disorder of bone and cartilage, unspecified   . Disturbance of skin sensation   . Diverticulosis   . DM (diabetes mellitus) ([place])   . DVT (deep venous thrombosis) ([place])   . DVT (deep venous thrombosis) ([place])   . Eczema   . Family history of anesthesia complication    PONV  . Fatty liver   . GERD (gastroesophageal reflux disease)   . History of hiatal hernia   .  History of pacemaker   . History of radiation therapy 10/24/12   brain  . HLD (hyperlipidemia)   . HTN (hypertension)   . Hypoglycemia, unspecified   . 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 Liver metastasis ([place])   . Lung metastasis ([place])   . Metastasis to brain ([place])   . Osteopenia   . Other nonspecific abnormal serum enzyme levels   . Other specified congenital anomalies of nervous system   . Pacemaker    autonomic dysfunction  . Pancreatitis    from therapy  . Peripheral vascular disease ([place])   . Pneumonia   . PONV (postoperative nausea and vomiting)   . POTS (postural orthostatic tachycardia syndrome)   . Recurrent upper respiratory infection (URI)   . Skin cancer    Hx: of lung lesion  . Stroke [facility])    left weaker    . Swelling of limb   . Urticaria     Family History  Problem Relation Age of Onset  . Allergic rhinitis Sister   . Stroke Mother   . Colon polyps Mother   . Colon cancer Mother   . Urticaria Mother   . Allergic rhinitis Mother   . Heart disease Father   . Diabetes Father   . Kidney disease Father   . Diabetes Maternal Grandmother   . Clotting disorder Maternal Grandmother        stroke  . Crohn's disease Maternal Grandmother   . Diabetes Paternal Grandmother   . Aneurysm Sister        brain    Social History   Socioeconomic History  . Marital status: Married    Spouse name: Not on file  . Number of children: 0  . Years of education: Not on file  . Highest education level: Not on file  Occupational History  . Occupation: PRESIDENT    Employer: [employer]    Comment: self employed  Tobacco Use  . Smoking status: Never Smoker  . Smokeless tobacco: Never Used  Vaping Use  . Vaping Use: Never used  Substance and Sexual Activity  . Alcohol use: Not Currently    Comment: glass of wine daily  . Drug use: No  . Sexual activity: Not on file  Other Topics Concern  . Not on file  Social History Narrative   4 years college hh of 2    Neg tad   Social Determinants of Health   Financial Resource Strain: Low Risk   . Difficulty of Paying Living Expenses: Not hard at all  Food Insecurity: No Food Insecurity  . Worried About Charity fundraiser in the Last Year: Never true  . Ran Out of Food in the Last Year: Never true  Transportation Needs: No Transportation Needs  . Lack of Transportation (Medical): No  . Lack of Transportation (Non-Medical): No  Physical Activity: Sufficiently Active  . Days of Exercise per Week: 5 days  . Minutes of Exercise per Session: 30 min  Stress: Stress Concern Present  . Feeling of Stress : To some extent  Social Connections: Unknown  . Frequency of Communication with Friends and Family: More than three times a week  .  Frequency of Social Gatherings with Friends and Family: Once a week  . Attends Religious Services: Not on file  . Active Member of Clubs or Organizations: No  . Attends Archivist Meetings: Never  . Marital Status: Married  Outpatient Medications Prior to Visit  Medication Sig Dispense Refill  . acetaminophen (TYLENOL) 500 MG tablet Take 500 mg by mouth at bedtime.    Marland Kitchen albuterol (PROVENTIL) (2.5 MG/3ML) 0.083% nebulizer solution Take 3 mLs (2.5 mg total) by nebulization every 4 (four) hours as needed for wheezing or shortness of breath. 180 mL 1  . albuterol (VENTOLIN HFA) 108 (90 Base) MCG/ACT inhaler INHALE 2 PUFFS INTO THE LUNGS EVERY 4 (FOUR) HOURS AS NEEDED FOR WHEEZING OR SHORTNESS OF BREATH. 18 Inhaler 0  . augmented betamethasone dipropionate (DIPROLENE) 0.05 % ointment Apply topically 2 (two) times daily. Limit to no longer than 2 weeks use. Not on face. 50 g 0  . bimatoprost (LATISSE) 0.03 % ophthalmic solution APPLY 1 DROP IN BOTH EYES ONCE A DAY    . calcium carbonate (TUMS - DOSED IN MG ELEMENTAL CALCIUM) 500 MG chewable tablet Chew 1 tablet by mouth daily as needed for indigestion or heartburn.     Marland Kitchen CALCIUM PO Take 1 tablet by mouth daily.    . cholestyramine (QUESTRAN) 4 g packet 1 packet mixed with water or non-carbonated drink    . clobetasol cream (TEMOVATE) 0.05 %     . clonazePAM (KLONOPIN) 1 MG tablet Take 1 mg by mouth 2 (two) times daily.     . cloNIDine (CATAPRES) 0.1 MG tablet Take 1 tablet (0.1 mg total) by mouth at bedtime. 90 tablet 3  . Desoximetasone (TOPICORT) 0.25 % ointment Apply 1 application topically 2 (two) times daily. 30 g 1  . diphenhydrAMINE (BENADRYL) 25 MG tablet Take 25 mg by mouth at bedtime as needed for allergies or sleep.     Marland Kitchen EPINEPHrine 0.3 mg/0.3 mL IJ SOAJ injection SMARTSIG:0.3 Milliliter(s) IM Once PRN    . fluorometholone (FML) 0.1 % ophthalmic suspension Place 1 drop into both eyes 4 (four) times daily as needed (itching).    1  . fluorouracil (EFUDEX) 5 % cream 1 application    . fluticasone (FLOVENT HFA) 44 MCG/ACT inhaler 2 puffs    . hydrocortisone (ANUSOL-HC) 25 MG suppository Place 1 suppository (25 mg total) rectally at bedtime as needed for hemorrhoids. 12 suppository 0  . hydrocortisone 2.5 % cream Apply 1 application topically 2 (two) times daily as needed (irritation). 30 g 0  . hyoscyamine (LEVSIN SL) 0.125 MG SL tablet Place under the tongue.    Marland Kitchen ibuprofen (ADVIL,MOTRIN) 200 MG tablet Take 200-400 mg by mouth 3 (three) times daily as needed for moderate pain.     Marland Kitchen levETIRAcetam (KEPPRA) 250 MG tablet Take 250 mg by mouth 3 (three) times daily. 6 am, 12 pm, 6 pm    . levocetirizine (XYZAL) 5 MG tablet TAKE 1 TABLET BY MOUTH EVERY DAY AS NEEDED 30 tablet 0  . lipase/protease/amylase (CREON) 12000 units CPEP capsule Take 12,000 Units by mouth daily with breakfast.    . Loteprednol-Tobramycin (ZYLET) 0.5-0.3 % SUSP Administer 1 drop to the right eye daily as needed.    . metoprolol tartrate (LOPRESSOR) 25 MG tablet Take 25 mg by mouth 2 (two) times daily.    . montelukast (SINGULAIR) 10 MG tablet Take 1 tablet (10 mg total) by mouth at bedtime. 30 tablet 5  . Olopatadine HCl (PATADAY) 0.2 % SOLN Use one drop in each eye once daily as needed. 2.5 mL 5  . polyethylene glycol (MIRALAX / GLYCOLAX) packet Take 17 g by mouth at bedtime.    . potassium chloride SA (K-DUR,KLOR-CON) 20 MEQ tablet Take 1  tablet (20 mEq total) by mouth 2 (two) times daily. 60 tablet 3  . prednisoLONE acetate (PRED FORTE) 1 % ophthalmic suspension     . sodium chloride (OCEAN) 0.65 % SOLN nasal spray Place 1 spray into both nostrils as needed for congestion.     Marland Kitchen Spacer/Aero-Holding Chambers ([place]) MISC See admin instructions.    . Syringe/Needle, Disp, (SYRINGE 3CC/27GX1-1/4") 27G X 1-1/4" 3 ML MISC Inject 1 mL into the muscle every 30 (thirty) days. 12 each 0  . valACYclovir (VALTREX) 1000 MG tablet Take 1 tablet  (1,000 mg total) by mouth 3 (three) times daily. For shingles 21 tablet 0  . Vitamin D, Ergocalciferol, (DRISDOL) 50000 units CAPS capsule TAKE 1 CAPSULE (50,000 UNITS TOTAL) BY MOUTH EVERY 7 (SEVEN) DAYS. (Patient taking differently: Take 50,000 Units by mouth See admin instructions.) 4 capsule 11  . nystatin (MYCOSTATIN) 100000 UNIT/ML suspension Use as directed 5 mLs in the mouth or throat 3 (three) times daily as needed (thrush).     No facility-administered medications prior to visit.     EXAM:  BP 120/70 (BP Location: Left Arm, Patient Position: Sitting, Cuff Size: Normal)   Pulse 71   Temp 98 F (36.7 C) (Oral)   Ht 5\' 3"  (1.6 m)   Wt 153 lb 6.4 oz (69.6 kg)   SpO2 99%   BMI 27.17 kg/m   Body mass index is 27.17 kg/m.  GENERAL: vitals reviewed and listed above, alert, oriented, appears well hydrated and in no acute distress HEENT: atraumatic, conjunctiva  clear, no obvious abnormalities on inspection of external nose and ears OP : masked  NECK: no obvious masses on inspection palpation  LUNGS: clear to auscultation bilaterally, no wheezes, rales or rhonchi, good air movement CV: HRRR, no clubbing cyanosis or  peripheral edema nl cap refill  abd no masses felt  MS: moves all extremities without noticeable focal  abnormality PSYCH: pleasant and cooperative, no obvious depression or anxiety Lab Results  Component Value Date   WBC 6.0 03/28/2018   HGB 13.5 03/28/2018   HCT 37.9 03/28/2018   PLT 225 03/28/2018   GLUCOSE 89 05/27/2015   CHOL 145 02/09/2014   TRIG 123.0 02/09/2014   HDL 47.90 02/09/2014   LDLDIRECT 109.7 10/11/2010   LDLCALC 73 02/09/2014   ALT 33 05/27/2015   AST 25 05/27/2015   NA 140 05/27/2015   K 4.1 05/27/2015   CL 102 11/12/2013   CREATININE 1.10 (H) 05/19/2019   BUN 18.8 05/27/2015   CO2 27 05/27/2015   TSH 1.146 12/17/2013   INR 1.03 08/28/2013   HGBA1C 5.9 07/15/2018   BP Readings from Last 3 Encounters:  07/27/20 120/70  05/31/20  120/70  04/12/20 118/84   Reviewed care everywhere [facility] last labs and CT report. ASSESSMENT AND PLAN:  Discussed the following assessment and plan:  Dysphagia, unspecified type - Plan: Ambulatory referral to Gastroenterology  Oral thrush history recurrent - Okay to take over the nystatin  Multiple allergies - Was told to avoid meat ?alpha gal  Malignant melanoma, metastatic ([place]) - In remission - Plan: Ambulatory referral to Gastroenterology  POTS (postural orthostatic tachycardia syndrome)  Moderate persistent asthma, unspecified whether complicated  Perennial allergic rhinitis with predominantly nonallergic component  History of pacemaker  Hiatal hernia - Plan: Ambulatory referral to Gastroenterology  Diverticulosis - Plan: Ambulatory referral to Gastroenterology  Medically complex patient Overall I think her GI symptoms need to be addressed. Possibly she could have EOE versus acid reflux could  be treated and remitted.  Not aware of any indication for surgeryfor diverticulosis but eval by GI as an order. Probably needs an endoscopy.  Rule out EOE infection.etc Possible  it is possible she has some underlying rheumatologic disease with a history of what they call Raynaud's but think high on the list. Agree and did discuss her plan for optimization of her current baseline health. In regard to recurrent oral thrush that is easily responsive to nystatin solution would pay attention to rinsing mouth out after her steroid inhalation. Will make referral to Dr. Kathlen Mody GI at [facility] where her oncology team resides. Record review.   55 minutes to include record review counsel conversation plan. -Patient advised to return or notify health care team  if  new concerns arise.  Patient Instructions  I will do a referral to dr Kathlen Mody   About your GI issues  Consider esophageal dysfunction,  EOE,  Yeast etc.  You can contact in interim.   Will do a record review  About the raynaud's and other  conditions .    Make sure rinse out mouth well after steroid inhaler.   Standley Brooking. Lashae Wollenberg M.D.

## 2020-07-27 NOTE — Patient Instructions (Addendum)
I will do a referral to dr Kathlen Mody   About your GI issues  Consider esophageal dysfunction,  EOE,  Yeast etc.  You can contact in interim.   Will do a record review  About the raynaud's and other conditions .    Make sure rinse out mouth well after steroid inhaler.

## 2020-08-02 ENCOUNTER — Emergency Department (HOSPITAL_BASED_OUTPATIENT_CLINIC_OR_DEPARTMENT_OTHER)
Admission: EM | Admit: 2020-08-02 | Discharge: 2020-08-02 | Disposition: A | Discharge status: Home / Self Care | Payer: Medicare Other | Attending: Emergency Medicine | Admitting: Emergency Medicine

## 2020-08-02 ENCOUNTER — Encounter (HOSPITAL_BASED_OUTPATIENT_CLINIC_OR_DEPARTMENT_OTHER): Payer: Self-pay | Admitting: *Deleted

## 2020-08-02 ENCOUNTER — Other Ambulatory Visit: Payer: Self-pay

## 2020-08-02 ENCOUNTER — Encounter: Payer: Self-pay | Admitting: Family Medicine

## 2020-08-02 ENCOUNTER — Telehealth (INDEPENDENT_AMBULATORY_CARE_PROVIDER_SITE_OTHER): Payer: Medicare Other | Admitting: Family Medicine

## 2020-08-02 VITALS — Temp 97.6°F

## 2020-08-02 DIAGNOSIS — R1032 Left lower quadrant pain: Secondary | ICD-10-CM | POA: Diagnosis not present

## 2020-08-02 DIAGNOSIS — Z5321 Procedure and treatment not carried out due to patient leaving prior to being seen by health care provider: Secondary | ICD-10-CM | POA: Diagnosis not present

## 2020-08-02 NOTE — ED Triage Notes (Signed)
C/o left lower abd pain x 2 days , seen by PMD today for same

## 2020-08-02 NOTE — Patient Instructions (Signed)
Please seek prompt care with your specialist or inperson care today.  I sent a message to your primary care office to see if they can work you in in the next 24 hours if your gastroenterologist is unable to help. Please contact your primary care office for an inperson visit if your gastroenterology office is not able to help.  If you are unable to get a visit with either, then please seek inperson care at the urgent care as we discussed, particularly if there is any worsening of symptoms.    I hope you are feeling better soon!   It was nice to meet you today. I help [name] out with telemedicine visits on Tuesdays and Thursdays and am available for visits on those days. If you have any concerns or questions following this visit please schedule a follow up visit with your Primary Care doctor or seek care at a local urgent care clinic to avoid delays in care.

## 2020-08-02 NOTE — Progress Notes (Signed)
Virtual Visit via Video Note  I connected with Anila  on 08/02/20 at  1:00 PM EDT by a video enabled telemedicine application and verified that I am speaking with the correct person using two identifiers.   Location patient: home, [place] Location provider:work or home office Persons participating in the virtual visit: patient, provider  I discussed the limitations of evaluation and management by telemedicine and the availability of in person appointments. The patient expressed understanding and agreed to proceed.   HPI:  Acute telemedicine visit for abdominal pain: -Onset: yesterday -Symptoms include: LLQ abd pain yesterday, rectal pain, chills, fatigue, poor appetite, dark stools - like pudding, constipation - uses mirilax every night, mild HA -Denies: fevers, vomiting, hematochezia, resp symptoms, inability to drink fluids -Has tried: tylenol  -Pertinent past medical history: hx of metastatic melanoma, has had alot of immunotherapy, reports has bad diverticulosis and hx of diverticulitis treated by specialist in [place] -Pertinent medication allergies: Allergies  Allergen Reactions  . Bee Venom Swelling  . Doxycycline Hives  . Erythromycin Hives  . Gadavist [Gadobutrol] Hives, Shortness Of Breath and Itching  . Iodinated Diagnostic Agents Itching, Shortness Of Breath and Hives  . Other Hives    Cannot use  Cream or Suppositories  . Penicillins Hives    Did it involve swelling of the face/tongue/throat, SOB, or low BP? No Did it involve sudden or severe rash/hives, skin peeling, or any reaction on the inside of your mouth or nose? Yes Did you need to seek medical attention at a hospital or doctor's office? Yes When did it last happen?5+ years If all above answers are "NO", may proceed with cephalosporin use.   . Sulfa Antibiotics Hives  . Preparation H [Pramox-Pe-Glycerin-Petrolatum] Hives    Cannot use  Cream or Suppositories   . Sulfonamide Derivatives Hives  .  Phenergan [Promethazine Hcl] Other (See Comments)    Causes restless leg syndrome  -COVID-19 vaccine status:  ROS: See pertinent positives and negatives per HPI.  Past Medical History:  Diagnosis Date  . Angio-edema   . Arthritis   . Asthma   . Atrial fibrillation ([place])   . Atrial tachycardia ([place])   . Cervicalgia   . Complication of anesthesia    pt states wakes up with "shakes"  . CVA (cerebral infarction)    2012 with dizziness and vision change felt embolic from atrial tachy  . Disorder of bone and cartilage, unspecified   . Disturbance of skin sensation   . Diverticulosis   . DM (diabetes mellitus) ([place])   . DVT (deep venous thrombosis) ([place])   . DVT (deep venous thrombosis) ([place])   . Eczema   . Family history of anesthesia complication    PONV  . Fatty liver   . GERD (gastroesophageal reflux disease)   . History of hiatal hernia   . History of pacemaker   . History of radiation therapy 10/24/12   brain  . HLD (hyperlipidemia)   . HTN (hypertension)   . Hypoglycemia, unspecified   . 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 Liver metastasis ([place])   . 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 Metastasis to brain ([place])   . Osteopenia   . Other nonspecific abnormal serum enzyme levels   . Other specified congenital anomalies of nervous system   . Pacemaker    autonomic dysfunction  . Pancreatitis    from therapy  . Peripheral vascular disease ([place])   . Pneumonia   .  PONV (postoperative nausea and vomiting)   . POTS (postural orthostatic tachycardia syndrome)   . Recurrent upper respiratory infection (URI)   . Skin cancer    Hx: of lung lesion  . Stroke [facility])    left weaker   . Swelling of limb   . Urticaria     Past Surgical History:  Procedure Laterality Date  . ABLATION SAPHENOUS VEIN W/ RFA     2002  . ADENOIDECTOMY    . CARPAL TUNNEL RELEASE Left   . CHOLECYSTECTOMY  03/28/2018  . CHOLECYSTECTOMY N/A 03/28/2018   Procedure: LAPAROSCOPIC  CHOLECYSTECTOMY WITH INTRAOPERATIVE CHOLANGIOGRAM;  Surgeon: Johnathan Hausen, MD;  Location: [facility];  Service: General;  Laterality: N/A;  . CRANIOTOMY N/A 09/30/2012   suboccipital craniectomy  . CRANIOTOMY Left 02/09/2013   Procedure: LEFT PARIETAL CRANIOTOMY with stealth;  Surgeon: [name], MD;  Location: [facility];  Service: Neurosurgery;  Laterality: Left;  LEFT Parietal Craniotomy for tumor with stealth  . DEEP AXILLARY SENTINEL NODE BIOPSY / EXCISION     due to extensive Melanoma-right arm  . fatty tumor removed     from chest  . INSERT / REPLACE / REMOVE PACEMAKER  2012   1999, x 3  . LAPAROSCOPY  09/06/2011   Procedure: LAPAROSCOPY OPERATIVE;  Surgeon: Claiborne Billings A. Pamala Hurry, MD;  Location: [facility];  Service: Gynecology;  Laterality: Left;  with Left Ovarian Cystectomy   . MELANOMA EXCISION     with removal of lymph nodes, left shoulder  . NOSE SURGERY  2010, 2012   for nose bleeds x 2  . PACEMAKER IMPLANT    . SINOSCOPY    . TONSILLECTOMY AND ADENOIDECTOMY       Current Outpatient Medications:  .  acetaminophen (TYLENOL) 500 MG tablet, Take 500 mg by mouth at bedtime., Disp: , Rfl:  .  albuterol (PROVENTIL) (2.5 MG/3ML) 0.083% nebulizer solution, Take 3 mLs (2.5 mg total) by nebulization every 4 (four) hours as needed for wheezing or shortness of breath., Disp: 180 mL, Rfl: 1 .  albuterol (VENTOLIN HFA) 108 (90 Base) MCG/ACT inhaler, INHALE 2 PUFFS INTO THE LUNGS EVERY 4 (FOUR) HOURS AS NEEDED FOR WHEEZING OR SHORTNESS OF BREATH., Disp: 18 Inhaler, Rfl: 0 .  augmented betamethasone dipropionate (DIPROLENE) 0.05 % ointment, Apply topically 2 (two) times daily. Limit to no longer than 2 weeks use. Not on face., Disp: 50 g, Rfl: 0 .  calcium carbonate (TUMS - DOSED IN MG ELEMENTAL CALCIUM) 500 MG chewable tablet, Chew 1 tablet by mouth daily as needed for indigestion or heartburn. , Disp: , Rfl:  .  CALCIUM PO, Take 1 tablet by mouth every 30 (thirty) days., Disp: , Rfl:  .   cholestyramine (QUESTRAN) 4 g packet, 1 packet mixed with water or non-carbonated drink, Disp: , Rfl:  .  clobetasol cream (TEMOVATE) 0.05 %, , Disp: , Rfl:  .  clonazePAM (KLONOPIN) 1 MG tablet, Take 1 mg by mouth 2 (two) times daily. , Disp: , Rfl:  .  cloNIDine (CATAPRES) 0.1 MG tablet, Take 1 tablet (0.1 mg total) by mouth at bedtime., Disp: 90 tablet, Rfl: 3 .  Desoximetasone (TOPICORT) 0.25 % ointment, Apply 1 application topically 2 (two) times daily., Disp: 30 g, Rfl: 1 .  diphenhydrAMINE (BENADRYL) 25 MG tablet, Take 25 mg by mouth at bedtime as needed for allergies or sleep. , Disp: , Rfl:  .  EPINEPHrine 0.3 mg/0.3 mL IJ SOAJ injection, SMARTSIG:0.3 Milliliter(s) IM Once PRN, Disp: , Rfl:  .  fluorometholone (FML) 0.1 % ophthalmic suspension, Place 1 drop into both eyes 4 (four) times daily as needed (itching). , Disp: , Rfl: 1 .  fluorouracil (EFUDEX) 5 % cream, 1 application, Disp: , Rfl:  .  fluticasone (FLOVENT HFA) 44 MCG/ACT inhaler, 2 puffs, Disp: , Rfl:  .  hydrocortisone (ANUSOL-HC) 25 MG suppository, Place 1 suppository (25 mg total) rectally at bedtime as needed for hemorrhoids., Disp: 12 suppository, Rfl: 0 .  hydrocortisone 2.5 % cream, Apply 1 application topically 2 (two) times daily as needed (irritation)., Disp: 30 g, Rfl: 0 .  hyoscyamine (LEVSIN SL) 0.125 MG SL tablet, Place under the tongue., Disp: , Rfl:  .  ibuprofen (ADVIL,MOTRIN) 200 MG tablet, Take 200-400 mg by mouth 3 (three) times daily as needed for moderate pain. , Disp: , Rfl:  .  levETIRAcetam (KEPPRA) 250 MG tablet, Take 250 mg by mouth 3 (three) times daily. 6 am, 12 pm, 6 pm, Disp: , Rfl:  .  levocetirizine (XYZAL) 5 MG tablet, TAKE 1 TABLET BY MOUTH EVERY DAY AS NEEDED, Disp: 30 tablet, Rfl: 0 .  lipase/protease/amylase (CREON) 12000 units CPEP capsule, Take 12,000 Units by mouth daily with breakfast., Disp: , Rfl:  .  Loteprednol-Tobramycin (ZYLET) 0.5-0.3 % SUSP, Administer 1 drop to the right eye  daily as needed., Disp: , Rfl:  .  metoprolol tartrate (LOPRESSOR) 25 MG tablet, Take 25 mg by mouth 2 (two) times daily., Disp: , Rfl:  .  montelukast (SINGULAIR) 10 MG tablet, Take 1 tablet (10 mg total) by mouth at bedtime., Disp: 30 tablet, Rfl: 5 .  nystatin (MYCOSTATIN) 100000 UNIT/ML suspension, Use as directed 5 mLs (500,000 Units total) in the mouth or throat 3 (three) times daily as needed (thrush)., Disp: 60 mL, Rfl: 2 .  Olopatadine HCl (PATADAY) 0.2 % SOLN, Use one drop in each eye once daily as needed., Disp: 2.5 mL, Rfl: 5 .  polyethylene glycol (MIRALAX / GLYCOLAX) packet, Take 17 g by mouth at bedtime., Disp: , Rfl:  .  potassium chloride SA (K-DUR,KLOR-CON) 20 MEQ tablet, Take 1 tablet (20 mEq total) by mouth 2 (two) times daily., Disp: 60 tablet, Rfl: 3 .  sodium chloride (OCEAN) 0.65 % SOLN nasal spray, Place 1 spray into both nostrils as needed for congestion. , Disp: , Rfl:  .  Spacer/Aero-Holding Chambers ([place]) MISC, See admin instructions., Disp: , Rfl:  .  Syringe/Needle, Disp, (SYRINGE 3CC/27GX1-1/4") 27G X 1-1/4" 3 ML MISC, Inject 1 mL into the muscle every 30 (thirty) days., Disp: 12 each, Rfl: 0 .  valACYclovir (VALTREX) 1000 MG tablet, Take 1 tablet (1,000 mg total) by mouth 3 (three) times daily. For shingles, Disp: 21 tablet, Rfl: 0 .  Vitamin D, Ergocalciferol, (DRISDOL) 50000 units CAPS capsule, TAKE 1 CAPSULE (50,000 UNITS TOTAL) BY MOUTH EVERY 7 (SEVEN) DAYS. (Patient taking differently: Take 50,000 Units by mouth See admin instructions.), Disp: 4 capsule, Rfl: 11  EXAM:  VITALS per patient if applicable:  GENERAL: alert, oriented, appears well and in no acute distress  HEENT: atraumatic, conjunttiva clear, no obvious abnormalities on inspection of external nose and ears  NECK: normal movements of the head and neck  LUNGS: on inspection no signs of respiratory distress, breathing rate appears normal, no obvious gross SOB, gasping or  wheezing  CV: no obvious cyanosis  MS: moves all visible extremities without noticeable abnormality  PSYCH/NEURO: pleasant and cooperative, no obvious depression or anxiety, speech and thought processing grossly intact  ASSESSMENT AND  PLAN:  Discussed the following assessment and plan:  Left lower quadrant abdominal pain  -we discussed possible serious and likely etiologies, options for evaluation and workup, limitations of telemedicine visit vs in person visit, treatment, treatment risks and precautions. Pt prefers to treat via telemedicine empirically rather than in person at this moment. However, given her symptoms, history and lengthy list of abx intolerances advised consultation with her GI specialty office or inperson exam to determine course. Query constipation, diverticulitis, Gastroenteritis vs other. She wanted an inperson visit with PCP office, but they told her PCP was out. Discussed UCC options and sent message to PCP office to see if they have [place] visits available. Advised if can not be seen by specialist or PCP this evening or in the morning to go to [facility].   Discussed options for inperson care if PCP office not available. Did let this patient know that I only do telemedicine on Tuesdays and Thursdays for [place]. Advised to schedule follow up visit with PCP or UCC if any further questions or concerns to avoid delays in care.   I discussed the assessment and treatment plan with the patient. The patient was provided an opportunity to ask questions and all were answered. The patient agreed with the plan and demonstrated an understanding of the instructions.     Lucretia Kern, DO

## 2020-08-03 ENCOUNTER — Encounter: Payer: Self-pay | Admitting: Family Medicine

## 2020-08-03 ENCOUNTER — Ambulatory Visit (INDEPENDENT_AMBULATORY_CARE_PROVIDER_SITE_OTHER): Payer: Medicare Other | Admitting: Family Medicine

## 2020-08-03 VITALS — BP 102/74 | HR 70 | Temp 97.6°F | Wt 151.6 lb

## 2020-08-03 DIAGNOSIS — K5732 Diverticulitis of large intestine without perforation or abscess without bleeding: Secondary | ICD-10-CM | POA: Diagnosis not present

## 2020-08-03 MED ORDER — CIPROFLOXACIN HCL 500 MG PO TABS: 500.0000 mg | ORAL_TABLET | Freq: Two times a day (BID) | ORAL | 0 refills | Status: AC

## 2020-08-03 MED ORDER — METRONIDAZOLE 500 MG PO TABS: 500.0000 mg | ORAL_TABLET | Freq: Two times a day (BID) | ORAL | 0 refills | Status: DC

## 2020-08-03 MED ORDER — PROCHLORPERAZINE MALEATE 10 MG PO TABS: 10.0000 mg | ORAL_TABLET | Freq: Four times a day (QID) | ORAL | 0 refills | Status: AC | PRN

## 2020-08-03 MED ORDER — CEFTRIAXONE SODIUM 1 G IJ SOLR
1.0000 g | Freq: Once | INTRAMUSCULAR | Status: AC
  Administered 2020-08-03: 1 g via INTRAMUSCULAR

## 2020-08-03 NOTE — Progress Notes (Signed)
   Subjective:    Patient ID: [name], female    DOB: 1955-09-14, 65 y.o.   MRN: [id number]  HPI Here with her husband for what is likely a bout of diverticulitis. She had had this twice before, the last bout occurring 2 years ago. Her colonoscopy in 2018 noted diverticulosis in the sigmoid and descending colon. About 4 days ago she developed LLQ pain with chills, though there was no demonstrable fever. She has had 2-3 small "pasty" stools a day cine then, no no blood or black stools were seen. She has had nausea off and on but no vomiting. She has eaten very little, but she has had small amounts of toast or mashed potatoes. She is drinking water and "Liquid IV" supplements. No urinary symptoms. Of note she has melanoma with metastases to the liver, and she sees Dr. [name] of [facility] and Dr. Beulah Gandy of [facility]. She had a non-contrasted CT of the abdomen and pelvis on 07-14-20 which showed no colonic inflammation, a normal appearing liver, and no lymphadenopathy. Her WBC was 5.4, Hgb was 14.1, and creatinine was 0.76. Her Oncologist is so concerned about the risks of future diverticular infections that she has set up a visit for [name] to see Dr. Johnathan Hausen on 08-10-20 to consider a partial colectomy surgery.    Review of Systems  Constitutional: Positive for chills. Negative for diaphoresis and fever.  Respiratory: Negative.   Cardiovascular: Negative.   Gastrointestinal: Positive for abdominal pain and nausea. Negative for abdominal distention, anal bleeding, blood in stool, constipation, diarrhea, rectal pain and vomiting.  Genitourinary: Negative.        Objective:   Physical Exam Constitutional:      Appearance: She is not ill-appearing.     Comments: She appears slightly dehydrated   Cardiovascular:     Rate and Rhythm: Normal rate and regular rhythm.     Pulses: Normal pulses.     Heart sounds: Normal heart sounds.  Pulmonary:     Effort: Pulmonary effort  is normal.     Breath sounds: Normal breath sounds.  Abdominal:     General: Abdomen is flat. Bowel sounds are normal. There is no distension.     Palpations: Abdomen is soft. There is no mass.     Tenderness: There is no right CVA tenderness, left CVA tenderness, guarding or rebound.     Hernia: No hernia is present.     Comments: Moderately tender in the LLQ   Neurological:     Mental Status: She is alert.           Assessment & Plan:  Acute diverticulitis, she is given a shot of Rocephin. We will follow this with 10 days of Cipro and Flagyl BID. She can use Compazine for nausea. Drink plenty of fluids. Use Tylenol for pain. Avoid NSAIDs. She will see Dr. Hassell Done as above. We spent 35 minutes discussing these issues together and reviewing records.  [name], MD

## 2020-08-04 ENCOUNTER — Telehealth: Payer: Self-pay | Admitting: Internal Medicine

## 2020-08-04 NOTE — Telephone Encounter (Signed)
Please advise 

## 2020-08-04 NOTE — Progress Notes (Signed)
Patient seen Dr. Sarajane Jews yesterday 08/04/2020 in office

## 2020-08-04 NOTE — Telephone Encounter (Signed)
The patient seen Dr. Sarajane Jews yesterday 08/04/2020 and he gave her a shot and sent in an antibiotics. She woke up late last night with really bad heart burn but the medication sees to be helping her Diverticulitis.  Please advise

## 2020-08-05 ENCOUNTER — Telehealth: Payer: Self-pay

## 2020-08-05 DIAGNOSIS — K5732 Diverticulitis of large intestine without perforation or abscess without bleeding: Secondary | ICD-10-CM

## 2020-08-05 NOTE — Telephone Encounter (Signed)
Message was sent to Dr Sarajane Jews for advise

## 2020-08-05 NOTE — Telephone Encounter (Signed)
Tell her to take Pepcid 20 mg BID until she is finished with the antibiotics

## 2020-08-05 NOTE — Telephone Encounter (Signed)
Put in the lab orders, so she can come by this afternoon

## 2020-08-05 NOTE — Telephone Encounter (Signed)
Poke with pt advised that Dr Sarajane Jews has already placed the labs, pt is scheduled for labs on 08/08/2020

## 2020-08-05 NOTE — Addendum Note (Signed)
Addended by: [name] A on: 08/05/2020 01:23 PM   Modules accepted: Orders

## 2020-08-08 ENCOUNTER — Other Ambulatory Visit: Payer: Medicare Other

## 2020-08-08 ENCOUNTER — Other Ambulatory Visit: Payer: Self-pay

## 2020-08-09 ENCOUNTER — Other Ambulatory Visit (INDEPENDENT_AMBULATORY_CARE_PROVIDER_SITE_OTHER): Payer: Medicare Other

## 2020-08-09 DIAGNOSIS — K5732 Diverticulitis of large intestine without perforation or abscess without bleeding: Secondary | ICD-10-CM | POA: Diagnosis not present

## 2020-08-09 LAB — HEPATIC FUNCTION PANEL
ALT: 27 U/L (ref 0–35)
AST: 27 U/L (ref 0–37)
Albumin: 4.2 g/dL (ref 3.5–5.2)
Alkaline Phosphatase: 120 U/L — ABNORMAL HIGH (ref 39–117)
Bilirubin, Direct: 0 mg/dL (ref 0.0–0.3)
Total Bilirubin: 0.3 mg/dL (ref 0.2–1.2)
Total Protein: 7.4 g/dL (ref 6.0–8.3)

## 2020-08-10 DIAGNOSIS — K5792 Diverticulitis of intestine, part unspecified, without perforation or abscess without bleeding: Secondary | ICD-10-CM | POA: Diagnosis not present

## 2020-08-10 DIAGNOSIS — K219 Gastro-esophageal reflux disease without esophagitis: Secondary | ICD-10-CM | POA: Diagnosis not present

## 2020-08-10 DIAGNOSIS — K449 Diaphragmatic hernia without obstruction or gangrene: Secondary | ICD-10-CM | POA: Diagnosis not present

## 2020-08-10 LAB — LACTATE DEHYDROGENASE: LDH: 200 U/L (ref 120–250)

## 2020-09-06 DIAGNOSIS — K52839 Microscopic colitis, unspecified: Secondary | ICD-10-CM | POA: Diagnosis not present

## 2020-09-06 DIAGNOSIS — R197 Diarrhea, unspecified: Secondary | ICD-10-CM | POA: Diagnosis not present

## 2020-09-06 DIAGNOSIS — Z9049 Acquired absence of other specified parts of digestive tract: Secondary | ICD-10-CM | POA: Diagnosis not present

## 2020-09-06 DIAGNOSIS — K5730 Diverticulosis of large intestine without perforation or abscess without bleeding: Secondary | ICD-10-CM | POA: Diagnosis not present

## 2020-09-06 DIAGNOSIS — Z86718 Personal history of other venous thrombosis and embolism: Secondary | ICD-10-CM | POA: Diagnosis not present

## 2020-09-06 DIAGNOSIS — Z85828 Personal history of other malignant neoplasm of skin: Secondary | ICD-10-CM | POA: Diagnosis not present

## 2020-09-06 DIAGNOSIS — K449 Diaphragmatic hernia without obstruction or gangrene: Secondary | ICD-10-CM | POA: Diagnosis not present

## 2020-09-06 DIAGNOSIS — R1032 Left lower quadrant pain: Secondary | ICD-10-CM | POA: Diagnosis not present

## 2020-09-06 DIAGNOSIS — K219 Gastro-esophageal reflux disease without esophagitis: Secondary | ICD-10-CM | POA: Diagnosis not present

## 2020-09-06 DIAGNOSIS — Z8673 Personal history of transient ischemic attack (TIA), and cerebral infarction without residual deficits: Secondary | ICD-10-CM | POA: Diagnosis not present

## 2020-09-06 DIAGNOSIS — Z6826 Body mass index (BMI) 26.0-26.9, adult: Secondary | ICD-10-CM | POA: Diagnosis not present

## 2020-09-06 DIAGNOSIS — K5792 Diverticulitis of intestine, part unspecified, without perforation or abscess without bleeding: Secondary | ICD-10-CM | POA: Diagnosis not present

## 2020-09-06 DIAGNOSIS — E119 Type 2 diabetes mellitus without complications: Secondary | ICD-10-CM | POA: Diagnosis not present

## 2020-09-06 DIAGNOSIS — I4891 Unspecified atrial fibrillation: Secondary | ICD-10-CM | POA: Diagnosis not present

## 2020-09-06 DIAGNOSIS — I10 Essential (primary) hypertension: Secondary | ICD-10-CM | POA: Diagnosis not present

## 2020-09-06 DIAGNOSIS — K5790 Diverticulosis of intestine, part unspecified, without perforation or abscess without bleeding: Secondary | ICD-10-CM | POA: Diagnosis not present

## 2020-09-13 DIAGNOSIS — R197 Diarrhea, unspecified: Secondary | ICD-10-CM | POA: Diagnosis not present

## 2020-09-28 DIAGNOSIS — Z20822 Contact with and (suspected) exposure to covid-19: Secondary | ICD-10-CM | POA: Diagnosis not present

## 2020-09-29 DIAGNOSIS — Z03818 Encounter for observation for suspected exposure to other biological agents ruled out: Secondary | ICD-10-CM | POA: Diagnosis not present

## 2020-09-29 DIAGNOSIS — Z20822 Contact with and (suspected) exposure to covid-19: Secondary | ICD-10-CM | POA: Diagnosis not present

## 2020-09-30 ENCOUNTER — Telehealth: Payer: Medicare Other

## 2020-10-01 ENCOUNTER — Other Ambulatory Visit: Payer: Self-pay | Admitting: Internal Medicine

## 2020-10-12 ENCOUNTER — Telehealth: Payer: Self-pay | Admitting: Pharmacist

## 2020-10-12 NOTE — Chronic Care Management (AMB) (Signed)
    Chronic Care Management Pharmacy Assistant   Name: BAELYN DORING  MRN: [id number] DOB: 18-Dec-1955  10/12/20-  Called patient to remind of appointment with Jeni Salles) on (10/13/20 at [place] via telephone.)  Patient aware of appointment date, time, and type of appointment (either telephone or in person). Patient aware to have/bring all medications, supplements, blood pressure and/or blood sugar logs to visit.  Questions: Have you had any recent office visit or specialist visit outside of [place]? Yes. A physician at [facility]. [facility].   Are there any concerns you would like to discuss during your office visit? No concerns at this time.  Are you having any problems obtaining your medications? Yes. There has been back order issues with the pharmacy and the cost has gone up on her medications.   If patient has any PAP medications ask if they are having any problems getting their PAP medication or refill? Patient has tired to get patient assistance in the past and never qualified. No assistance at this time.   Care Gaps:  AWV - scheduled for 03/18/21 Hepatitis C screening - never done Tetanus/TDAP - never done Zoster vaccines - never done Pap smear - never done Pneumonia vaccine - 10/03/13 Flu vaccine - due  Star Rating Drug:  None.   Any gaps in medications fill history? N/A  [facility] Pharmacist Assistant [phone]

## 2020-10-13 ENCOUNTER — Telehealth: Payer: Self-pay | Admitting: Pharmacist

## 2020-10-13 ENCOUNTER — Ambulatory Visit (INDEPENDENT_AMBULATORY_CARE_PROVIDER_SITE_OTHER): Payer: Medicare Other | Admitting: Pharmacist

## 2020-10-13 DIAGNOSIS — I498 Other specified cardiac arrhythmias: Secondary | ICD-10-CM

## 2020-10-13 DIAGNOSIS — I10 Essential (primary) hypertension: Secondary | ICD-10-CM

## 2020-10-13 DIAGNOSIS — G90A Postural orthostatic tachycardia syndrome (POTS): Secondary | ICD-10-CM

## 2020-10-13 DIAGNOSIS — E538 Deficiency of other specified B group vitamins: Secondary | ICD-10-CM

## 2020-10-13 NOTE — Telephone Encounter (Signed)
During CCM telephone visit today, patient reported that Dr. Caryl Comes no longer wishes to prescribe vitamin B12 and preferred to defer to PCP- refer to refill encounter on 10/01/20 with cardiology office.  Patient was curious if PCP would prescribe her vitamin B12 injections as she is almost out. Will route to PCP for the request.

## 2020-10-13 NOTE — Patient Instructions (Signed)
Hi Frederica,  It was great to catch up again! I hope everything goes according to plan with your procedure and some your stomach issues are resolved. Please be on the lookout for the patient assistance application for Creon in the mail.   Please reach out to me if you have any questions or need anything before our follow up!  Best, [name]  Jeni Salles, PharmD, [facility] [place] [facility]   Visit Information   Goals Addressed   None    Patient Care Plan: [facility]CM Pharmacy Care Plan     Problem Identified: Problem: Hypertension, GERD, Asthma, Anxiety, Osteopenia, Allergic Rhinitis and Seizures, Vitamin B12 deficiency, Vitamin D deficiency      Long-Range Goal: Patient-Specific Goal   Start Date: 07/01/2020  Expected End Date: 07/01/2021  Recent Progress: On track  Priority: High  Note:   Current Barriers:  Unable to independently monitor therapeutic efficacy Unable to achieve control of blood pressure during taper   Pharmacist Clinical Goal(s):  Patient will achieve adherence to monitoring guidelines and medication adherence to achieve therapeutic efficacy achieve control of blood pressure as evidenced by home readings  through collaboration with PharmD and provider.   Interventions: 1:1 collaboration with Panosh, Standley Brooking, MD regarding development and update of comprehensive plan of care as evidenced by provider attestation and co-signature Inter-disciplinary care team collaboration (see longitudinal plan of care) Comprehensive medication review performed; medication list updated in electronic medical record  Hypertension (BP goal <130/80) -Not ideally controlled -Current treatment: Clonidine 0.1mg , 1 tablet at bedtime Metoprolol tartrate 25 mg daily -Medications previously tried: n/a  -Current home readings: 130/101, 1150/90 (up and down) -Current dietary habits: did not discuss -Current exercise habits: exercising 2-3 days a  week; walked 2.5 miles yesterday -Denies hypotensive/hypertensive symptoms -Educated on Importance of home blood pressure monitoring; Proper BP monitoring technique; -Counseled to monitor BP at home daily, document, and provide log at future appointments -Counseled on diet and exercise extensively Recommended to continue current medication Recommended for patient to set up an office visit with Dr. Caryl Comes to consider amlodipine to decrease current BP medications and help with Raynaud's as Dr. Caryl Comes is in agreement with plan.  Asthma (Goal: control symptoms) -Controlled -Current treatment  Albuterol 0.083% nebulizer solution, 3 MLs every four hours as needed for wheezing or shortness of breath Albuterol HFA 153mcg/act inhaler, 2 puffs every hours as needed for wheezing or shortness of breath Advair 230/21 inhale once daily -Medications previously tried: n/a  -Exacerbations requiring treatment in last 6 months: none -Patient reports consistent use of maintenance inhaler -Frequency of rescue inhaler use: none in last 6 months -Counseled on Proper inhaler technique; Benefits of consistent maintenance inhaler use When to use rescue inhaler Differences between maintenance and rescue inhalers -Recommended to continue current medication  Depression/Anxiety (Goal: minimize symptoms) -Controlled -Current treatment: Clonazepam 1mg , 1 tablet twice daily -Medications previously tried/failed: n/a -PHQ9: n/a -GAD7: n/a -Educated on Benefits of medication for symptom control -Recommended to continue current medication   Osteopenia (Goal prevent fractures) -Controlled -Last DEXA Scan: 07/31/19  T-Score femoral neck: -2.0  T-Score total hip: n/a  T-Score lumbar spine: -2.0  T-Score forearm radius: n/a  10-year probability of major osteoporotic fracture: unknown  10-year probability of hip fracture: unknown -Patient is not a candidate for pharmacologic treatment -Current treatment  Vitamin D3  50,000 units, 1 capsule once a week Calcium 500 mg, 1 tablet once daily  -Medications previously tried: n/a  -Recommend [phone] units of vitamin  D daily. Recommend 1200 mg of calcium daily from dietary and supplemental sources. Recommend weight-bearing and muscle strengthening exercises for building and maintaining bone density. -Recommended to continue current medication  GERD/Heartburn (Goal: minimize symptoms) -Controlled -Current treatment  Calcium carbonate (Tums) 500mg , chew 1 tablet daily as needed for indigestion or heartburn -Medications previously tried: none  -Recommended to continue current medication  Colitis (Goal: minimize symptoms) -Controlled -Current treatment  Lipase/ protease/ amylase (Creon) 12,000 units, 1 capsule once daily with breakfast  (taking as needed) -Medications previously tried: none  -Recommended to continue current medication Assessed patient finances. Patient may qualify for creon PAP. Plan to mail to patient.  Atopic dermatitis (Goal: minimize symptoms) -Not ideally controlled -Current treatment  Betamethasone dipropionate 0.05% ointment, apply two times daily. Limit to no longer than 2 weeks use. Not on face. Desoximetasone 1.76% ointment, 1 application twice daily Diphenhydramine (Benadryl) 25mg , 1 tablet at bedtime as needed Epipen as needed -Medications previously tried: n/a  -Recommended discussing results with Dr. Regis Bill as patient reports she underwent extensive allergy testing. Scheduled PCP appointment to discuss.  Allergic rhinitis (Goal: minimize symptoms) -Controlled -Current treatment  Fluticasone (Flonase) 70mcg/act nasal spray, 2 sprays into both nostrils daily as needed  levocetirizine (Xyzal) 5mg , 1 tablet everday as needed Sodium chloride 0.65% nasal spray solution, 1 spray into both nostrils as needed for congesion (uses 3 to 4 x/ day)  Olopatadine (Pataday) 0.2% solution, 1 drop in each eye once daily as  needed -Medications previously tried: montelukast (lethargy), claritin (ineffective)   -Recommended to continue current medication Counseled on duplication of therapy with Benadryl, Claritin and Xyzal and patient stopped using Claritin due to lack of efficacy.  Seizures (Goal: prevent seizures) -Controlled -Current treatment  levetiracetam 250mg , 1 tablet three times daily -Medications previously tried: none  -Recommended to continue current medication  Thrush (Goal: minimize symptoms) -Controlled -Current treatment  nystatin 100000 unit/ ml, 5 MLs three times daily as needed thrush -Medications previously tried: none  -Recommended to continue current medication  Pain (Goal: minimize pain) -Controlled -Current treatment  Ibuprofen 200mg , 200-400mg  three times daily as needed for moderate pain Acetaminophen 500mg , 1 tablet at bedtime -Medications previously tried: n/a  -  Patient had fractured her toe and they saw arthritis on her x-ray; suggested trying a topical product such as Voltaren gel but she prefers not to and has had success with yunkers ointment from medical supply   Vitamin B12 deficiency (Goal: 211-911) -Controlled -Current treatment  Cyanocobalamin 1000 mcg SL 1 tablet daily -Medications previously tried: none  -Recommended to continue current medication   Health Maintenance -Vaccine gaps: shingles, COVID, tetanus -Current therapy:  Hydrocortisone 25mg  suppository, place 1 rectally at bedtime as needed for hemorrhoids Potassium chloride 81mEq, 1 tablet twice daily -Educated on Cost vs benefit of each product must be carefully weighed by individual consumer -Patient is satisfied with current therapy and denies issues -Recommended to continue current medication  Patient Goals/Self-Care Activities Patient will:  - take medications as prescribed check blood pressure daily, document, and provide at future appointments target a minimum of 150 minutes of moderate  intensity exercise weekly  Follow Up Plan: Telephone follow up appointment with care management team member scheduled for: 6 months       Patient verbalizes understanding of instructions provided today and agrees to view in [place].  The pharmacy team will reach out to the patient again over the next 30 days.   Viona Gilmore, [facility]

## 2020-10-13 NOTE — Progress Notes (Signed)
Chronic Care Management Pharmacy Note  10/13/2020 Name:  [name] MRN:  [id number] DOB:  [birth year]  Summary: BP not at goal < 130/80 Pt no longer has part D insurance   Recommendations/Changes made from today's visit: -Recommended follow up with cardiology for BP management and consideration of amlodipine for Raynaud's -Requested refill for metoprolol from cardiology and vitamin B12 injections from PCP   Plan: -Apply for PAP for Creon as patient likely meets income requirements  Subjective: [name] is an 65 y.o. year old female who is a primary patient of [name], Amanda Brooking, MD.  The CCM team was consulted for assistance with disease management and care coordination needs.    Engaged with patient by telephone for follow up visit in response to provider referral for pharmacy case management and/or care coordination services.   Consent to Services:  The patient was given information about Chronic Care Management services, agreed to services, and gave verbal consent prior to initiation of services.  Please see initial visit note for detailed documentation.   Patient Care Team: [name], Amanda Brooking, MD as PCP - Lisa Roca, MD as Consulting Physician (Neurosurgery) [name], MD as Consulting Physician (Oncology) [name], MD as Referring Physician (Internal Medicine) Jodi Marble, MD as Consulting Physician (Otolaryngology) Danella Sensing, MD as Consulting Physician (Dermatology) Viona Gilmore, [facility] as Pharmacist (Pharmacist) Harold Hedge, Darrick Grinder, MD as Consulting Physician (Allergy and Immunology)  Recent office visits: 08/03/20 [name], MD: Patient presented for diverticulitis. Prescribed Cipro and Flagyl BID x 10 days.  08/02/20 [name], DO: Patient presented for video visit with abdominal pain. Recommended in person follow up visit.  07/27/20 [name], MD: Patient presented for chronic conditions follow up. Referred to Dr. Kathlen Mody  for GI concerns.   03/22/20 [name], LPN: Patient presented for AWV.  Recent consult visits: 09/06/20 Weber Cooks, MD (gastro): Patient presented for diverticulitis. Plan for upper endoscopy.  2/70/35 [name], MD (oncology): Patient presented for melanoma follow up.  05/31/20 Virl Axe, MD (cardiology): Patient presented for POTS. D/c'd metoprolol.   04/12/20 Chanetta Marshall, NP (cardiology): Patient presented for sinus node dysfunction.    02/08/20 Dianne Dun, MD (allergy): Unable to access notes.  01/07/20 Danella Sensing (dermatology): Unable to access notes.   Hospital visits: 08/02/20 Patient presented to ED with abdominal pain.  Objective:  Lab Results  Component Value Date   CREATININE 1.10 (H) 05/19/2019   BUN 18.8 05/27/2015   GFR 85.78 05/24/2011   GFRNONAA 54 (L) 05/19/2019   GFRAA >60 05/19/2019   NA 140 05/27/2015   K 4.1 05/27/2015   CALCIUM 9.8 05/27/2015   CO2 27 05/27/2015   GLUCOSE 89 05/27/2015    Lab Results  Component Value Date/Time   HGBA1C 5.9 07/15/2018 02:59 PM   HGBA1C 5.5 01/03/2006 11:18 AM   GFR 85.78 05/24/2011 11:08 AM   GFR 65.45 10/16/2010 02:11 PM    Last diabetic Eye exam: No results found for: HMDIABEYEEXA  Last diabetic Foot exam: No results found for: HMDIABFOOTEX   Lab Results  Component Value Date   CHOL 145 02/09/2014   HDL 47.90 02/09/2014   LDLCALC 73 02/09/2014   LDLDIRECT 109.7 10/11/2010   TRIG 123.0 02/09/2014   CHOLHDL 3 02/09/2014    Hepatic Function Latest Ref Rng & Units 08/09/2020 05/27/2015 02/23/2015  Total Protein 6.0 - 8.3 g/dL 7.4 8.0 8.2  Albumin 3.5 - 5.2 g/dL 4.2 4.4 4.7  AST 0 - 37  U/L 27 25 35(H)  ALT 0 - 35 U/L 27 33 42  Alk Phosphatase 39 - 117 U/L 120(H) 127 157(H)  Total Bilirubin 0.2 - 1.2 mg/dL 0.3 0.40 0.47  Bilirubin, Direct 0.0 - 0.3 mg/dL 0.0 - -    Lab Results  Component Value Date/Time   TSH 1.146 12/17/2013 01:09 PM   TSH 0.318 (L) 08/28/2013 05:40 PM    TSH 0.82 05/01/2007 11:38 AM    CBC Latest Ref Rng & Units 03/28/2018 05/27/2015 02/23/2015  WBC 4.0 - 10.5 K/uL 6.0 5.4 5.4  Hemoglobin 12.0 - 15.0 g/dL 13.5 14.4 14.8  Hematocrit 36.0 - 46.0 % 37.9 41.7 43.0  Platelets 150 - 400 K/uL 225 253 266    Lab Results  Component Value Date/Time   VD25OH 24 (L) 10/24/2009 08:23 PM   VD25OH 24 (L) 11/17/2008 07:53 PM    Clinical ASCVD: Yes  The ASCVD Risk score Mikey Bussing DC Jr., et al., 2013) failed to calculate for the following reasons:   The patient has a prior MI or stroke diagnosis    Depression screen [facility] 2/9 03/22/2020 03/15/2020  Decreased Interest 0 0  Down, Depressed, Hopeless 0 0  PHQ - 2 Score 0 0  Some recent data might be hidden      Social History   Tobacco Use  Smoking Status Never  Smokeless Tobacco Never   BP Readings from Last 3 Encounters:  08/03/20 102/74  08/02/20 (!) 145/89  07/27/20 120/70   Pulse Readings from Last 3 Encounters:  08/03/20 70  08/02/20 78  07/27/20 71   Wt Readings from Last 3 Encounters:  08/03/20 151 lb 9.6 oz (68.8 kg)  08/02/20 150 lb (68 kg)  07/27/20 153 lb 6.4 oz (69.6 kg)   BMI Readings from Last 3 Encounters:  08/03/20 26.85 kg/m  08/02/20 26.57 kg/m  07/27/20 27.17 kg/m    Assessment/Interventions: Review of patient past medical history, allergies, medications, health status, including review of consultants reports, laboratory and other test data, was performed as part of comprehensive evaluation and provision of chronic care management services.   SDOH:  (Social Determinants of Health) assessments and interventions performed: No  SDOH Screenings   Alcohol Screen: Not on file  Depression (PHQ2-9): Low Risk    PHQ-2 Score: 0  Financial Resource Strain: Low Risk    Difficulty of Paying Living Expenses: Not hard at all  Food Insecurity: No Food Insecurity   Worried About Charity fundraiser in the Last Year: Never true   Ran Out of Food in the Last Year: Never true   Housing: Low Risk    Last Housing Risk Score: 0  Physical Activity: Sufficiently Active   Days of Exercise per Week: 5 days   Minutes of Exercise per Session: 30 min  Social Connections: Unknown   Frequency of Communication with Friends and Family: More than three times a week   Frequency of Social Gatherings with Friends and Family: Once a week   Attends Religious Services: Not on Electrical engineer or Organizations: No   Attends Archivist Meetings: Never   Marital Status: Married  Stress: Stress Concern Present   Feeling of Stress : To some extent  Tobacco Use: Low Risk    Smoking Tobacco Use: Never   Smokeless Tobacco Use: Never  Transportation Needs: No Transportation Needs   Lack of Transportation (Medical): No   Lack of Transportation (Non-Medical): No   Patient isn't eating much and  everything goes right through her. Patient has an eating fear with diverticulosis. Recommended nutritionist/dietician and patient agreed to meet the nutritionist after procedure.  Patient is having a hard time getting refills on her metoprolol, clonazepam and vitamin B12 injections. She requested my help. Sent a message to Dr. Olin Pia office about metoprolol and to PCP about vitamin B12.  Discussed cost of medications as patient no longer has a part D plan. She inquired about the [employer] and informed her the list is limited with only generics. Plan on sending her a patient assistance application for Creon to see if she qualifies.  CCM Care Plan  Allergies  Allergen Reactions   Bee Venom Swelling   Doxycycline Hives   Erythromycin Hives   Gadavist [Gadobutrol] Hives, Shortness Of Breath and Itching   Iodinated Diagnostic Agents Itching, Shortness Of Breath and Hives   Other Hives    Cannot use  Cream or Suppositories   Penicillins Hives    Did it involve swelling of the face/tongue/throat, SOB, or low BP? No Did it involve sudden or severe rash/hives, skin  peeling, or any reaction on the inside of your mouth or nose? Yes Did you need to seek medical attention at a hospital or doctor's office? Yes When did it last happen?      5+ years If all above answers are "NO", may proceed with cephalosporin use.    Sulfa Antibiotics Hives   Preparation H [Pramox-Pe-Glycerin-Petrolatum] Hives    Cannot use  Cream or Suppositories    Sulfonamide Derivatives Hives   Phenergan [Promethazine Hcl] Other (See Comments)    Causes restless leg syndrome    Medications Reviewed Today     Reviewed by Laurey Morale, MD (Physician) on 08/03/20 at 1148  Med List Status: <None>   Medication Order Taking? Sig Documenting Provider Last Dose Status Informant  acetaminophen (TYLENOL) 500 MG tablet [id number] Yes Take 500 mg by mouth at bedtime. [provider] Taking Active Self           Med Note [facility] MENDEZ, CARLOS A   Tue May 31, 2020  2:15 PM)    albuterol (PROVENTIL) (2.5 MG/3ML) 0.083% nebulizer solution [id number] Yes Take 3 mLs (2.5 mg total) by nebulization every 4 (four) hours as needed for wheezing or shortness of breath. Bobbitt, [place] Muta, MD Taking Active   albuterol (VENTOLIN HFA) 108 (90 Base) MCG/ACT inhaler [id number] Yes INHALE 2 PUFFS INTO THE LUNGS EVERY 4 (FOUR) HOURS AS NEEDED FOR WHEEZING OR SHORTNESS OF BREATH. [name], Amanda Brooking, MD Taking Active   augmented betamethasone dipropionate (DIPROLENE) 0.05 % ointment [id number] Yes Apply topically 2 (two) times daily. Limit to no longer than 2 weeks use. Not on face. [name], Amanda Brooking, MD Taking Active   calcium carbonate (TUMS - DOSED IN MG ELEMENTAL CALCIUM) 500 MG chewable tablet [id number] Yes Chew 1 tablet by mouth daily as needed for indigestion or heartburn.  [provider] Taking Active Self  CALCIUM PO [id number] Yes Take 1 tablet by mouth every 30 (thirty) days. [provider] Taking Active Self  cholestyramine (QUESTRAN) 4 g packet [id number] Yes 1 packet mixed with  water or non-carbonated drink [provider] Taking Active   ciprofloxacin (CIPRO) 500 MG tablet [id number] Yes Take 1 tablet (500 mg total) by mouth 2 (two) times daily for 10 days. Laurey Morale, MD  Active   clobetasol cream (TEMOVATE) 0.05 % [id number] Yes  [provider] Taking Active   clonazePAM Bobbye [place])  1 MG tablet [id number] Yes Take 1 mg by mouth 2 (two) times daily.  [provider] Taking Active Self  cloNIDine (CATAPRES) 0.1 MG tablet [id number] Yes Take 1 tablet (0.1 mg total) by mouth at bedtime. Deboraha Sprang, MD Taking Active   Desoximetasone (TOPICORT) 0.25 % ointment [id number] Yes Apply 1 application topically 2 (two) times daily. [name], Amanda Brooking, MD Taking Active   diphenhydrAMINE (BENADRYL) 25 MG tablet [id number] Yes Take 25 mg by mouth at bedtime as needed for allergies or sleep.  [provider] Taking Active Self  EPINEPHrine 0.3 mg/0.3 mL IJ SOAJ injection [id number] Yes SMARTSIG:0.3 Milliliter(s) IM Once PRN [provider] Taking Active   fluorometholone (FML) 0.1 % ophthalmic suspension [id number] Yes Place 1 drop into both eyes 4 (four) times daily as needed (itching).  [provider] Taking Active Self  fluorouracil (EFUDEX) 5 % cream [id number] Yes 1 application [provider] Taking Active   fluticasone (FLOVENT HFA) 44 MCG/ACT inhaler [id number] Yes 2 puffs [provider] Taking Active   hydrocortisone (ANUSOL-HC) 25 MG suppository [id number] Yes Place 1 suppository (25 mg total) rectally at bedtime as needed for hemorrhoids. [name], MD Taking Active Self  hydrocortisone 2.5 % cream [id number] Yes Apply 1 application topically 2 (two) times daily as needed (irritation). [name], MD Taking Active Self  hyoscyamine (LEVSIN SL) 0.125 MG SL tablet [id number] Yes Place under the tongue. [provider] Taking Active   ibuprofen (ADVIL,MOTRIN) 200 MG tablet [id number] Yes Take  200-400 mg by mouth 3 (three) times daily as needed for moderate pain.  [provider] Taking Active Self  levETIRAcetam (KEPPRA) 250 MG tablet [id number] Yes Take 250 mg by mouth 3 (three) times daily. 6 am, 12 pm, 6 pm [provider] Taking Active Self  levocetirizine (XYZAL) 5 MG tablet [id number] Yes TAKE 1 TABLET BY MOUTH EVERY DAY AS NEEDED Bobbitt, [place] Muta, MD Taking Active   lipase/protease/amylase (CREON) 12000 units CPEP capsule [id number] Yes Take 12,000 Units by mouth daily with breakfast. [provider] Taking Active Self           Med Note [facility] MENDEZ, CARLOS A   Tue May 31, 2020  2:15 PM)    Loteprednol-Tobramycin (ZYLET) 0.5-0.3 % SUSP [id number] Yes Administer 1 drop to the right eye daily as needed. [provider] Taking Active   metoprolol tartrate (LOPRESSOR) 25 MG tablet [id number] Yes Take 25 mg by mouth 2 (two) times daily. [provider] Taking Active   metroNIDAZOLE (FLAGYL) 500 MG tablet [id number] Yes Take 1 tablet (500 mg total) by mouth 2 (two) times daily. Laurey Morale, MD  Active   montelukast (SINGULAIR) 10 MG tablet [id number] Yes Take 1 tablet (10 mg total) by mouth at bedtime. Bobbitt, [place] Muta, MD Taking Active   nystatin (MYCOSTATIN) 100000 UNIT/ML suspension [id number] Yes Use as directed 5 mLs (500,000 Units total) in the mouth or throat 3 (three) times daily as needed (thrush). [name], Amanda Brooking, MD Taking Active   Olopatadine HCl (PATADAY) 0.2 % SOLN [id number] Yes Use one drop in each eye once daily as needed. Bobbitt, [place] Muta, MD Taking Active   polyethylene glycol [facility] / GLYCOLAX) packet [id number] Yes Take 17 g by mouth at bedtime. [provider] Taking Active Self  potassium chloride SA (K-DUR,KLOR-CON) 20 MEQ tablet [id number] Yes Take 1 tablet (20 mEq total) by mouth 2 (two) times daily. Owens Shark, NP Taking Active Self  prochlorperazine (  COMPAZINE) 10 MG tablet [id number] Yes Take  1 tablet (10 mg total) by mouth every 6 (six) hours as needed for nausea or vomiting. Laurey Morale, MD  Active   sodium chloride (OCEAN) 0.65 % SOLN nasal spray [id number] Yes Place 1 spray into both nostrils as needed for congestion.  [provider] Taking Active Self  Spacer/Aero-Holding Donavan Burnet DIAMOND) [place] [id number] Yes See admin instructions. [provider] Taking Active   Syringe/Needle, Disp, (SYRINGE 3CC/27GX1-1/4") 27G X 1-1/4" 3 ML MISC [id number] Yes Inject 1 mL into the muscle every 30 (thirty) days. Deboraha Sprang, MD Taking Active Self  valACYclovir (VALTREX) 1000 MG tablet [id number] Yes Take 1 tablet (1,000 mg total) by mouth 3 (three) times daily. For shingles [name], Amanda Brooking, MD Taking Active   Vitamin D, Ergocalciferol, (DRISDOL) 50000 units CAPS capsule [id number] Yes TAKE 1 CAPSULE (50,000 UNITS TOTAL) BY MOUTH EVERY 7 (SEVEN) DAYS.  Patient taking differently: Take 50,000 Units by mouth See admin instructions.   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   S/P laparoscopic cholecystectomy 03/28/2018   Dehydration 05/18/2016   History of pacemaker    POTS (postural orthostatic tachycardia syndrome)    Iron deficiency anemia 11/11/2014   Chronic colitis 08/28/2013   Malignant melanoma, metastatic ([place]) 02/09/2013   Malignant melanoma ([place]) 12/01/2012   Brain metastasis ([place]) 10/16/2012   Hypertension 09/30/2012   Anxiety state, unspecified 09/30/2012   Hypoxemia 09/30/2012   Dysautonomia ([place]) 02/25/2011   Anticoagulant long-term use 01/19/2011   Neuritis 01/19/2011   Skin change 01/19/2011    CVA (cerebral infarction)    Palpitation 06/28/2010   Pacemaker-Biotronik 06/28/2010   Dizziness and giddiness 04/27/2010   DIVERTICULOSIS, COLON, HX OF 01/19/2009   CAROTID SINUS SYNDROME 11/17/2008   OTHER MALAISE AND FATIGUE 11/17/2008   HYPERTENSION, BENIGN 11/02/2008   ATRIAL TACHYCARDIA 09/16/2008   SYNCOPE, HX OF 08/09/2008   INJURY UNSPEC NERVE SHOULDER GIRDLE&UPPER LIMB 07/12/2008   PERSONAL HISTORY OF MALIGNANT MELANOMA OF SKIN 07/12/2008   LUQ PAIN 12/08/2007   NECK PAIN 09/26/2007   NUMBNESS, ARM 09/26/2007   CPK, ABNORMAL 09/26/2007   ARM PAIN, LEFT 06/25/2007   SWELLING OF LIMB 06/25/2007   OSTEOPENIA 06/25/2007    Immunization History  Administered Date(s) Administered   Influenza Split 03/03/2012   PFIZER(Purple Top)SARS-COV-2 Vaccination 05/01/2019   PPD Test 07/01/2013   Pneumococcal Polysaccharide-23 10/03/2012    Conditions to be addressed/monitored:  Hypertension, GERD, Asthma, Anxiety, Osteopenia, Allergic Rhinitis and Seizures, Vitamin B12 deficiency, Vitamin D deficiency  Care Plan : CCM Pharmacy Care Plan  Updates made by Viona Gilmore, [place] since 10/13/2020 12:00 AM     Problem: Problem: Hypertension, GERD, Asthma, Anxiety, Osteopenia, Allergic Rhinitis and Seizures, Vitamin B12 deficiency, Vitamin D deficiency      Long-Range Goal: Patient-Specific Goal   Start Date: 07/01/2020  Expected End Date: 07/01/2021  Recent Progress: On track  Priority: High  Note:   Current Barriers:  Unable to independently monitor therapeutic efficacy Unable to achieve control of blood pressure during taper   Pharmacist Clinical Goal(s):  Patient will achieve adherence to monitoring guidelines and medication  adherence to achieve therapeutic efficacy achieve control of blood pressure as evidenced by home readings  through collaboration with PharmD and provider.   Interventions: 1:1 collaboration with [name], Amanda Brooking, MD regarding development and update of  comprehensive plan of care as evidenced by provider attestation and co-signature Inter-disciplinary care team collaboration (see longitudinal plan of care) Comprehensive medication review performed; medication list updated in electronic medical record  Hypertension (BP goal <130/80) -Not ideally controlled -Current treatment: Clonidine 0.52m, 1 tablet at bedtime Metoprolol tartrate 25 mg daily -Medications previously tried: n/a  -Current home readings: 130/101, 1150/90 (up and down) -Current dietary habits: did not discuss -Current exercise habits: exercising 2-3 days a week; walked 2.5 miles yesterday -Denies hypotensive/hypertensive symptoms -Educated on Importance of home blood pressure monitoring; Proper BP monitoring technique; -Counseled to monitor BP at home daily, document, and provide log at future appointments -Counseled on diet and exercise extensively Recommended to continue current medication Recommended for patient to set up an office visit with Dr. KCaryl Comesto consider amlodipine to decrease current BP medications and help with Raynaud's as Dr. KCaryl Comesis in agreement with plan.  Asthma (Goal: control symptoms) -Controlled -Current treatment  Albuterol 0.083% nebulizer solution, 3 MLs every four hours as needed for wheezing or shortness of breath Albuterol HFA 106108m/act inhaler, 2 puffs every hours as needed for wheezing or shortness of breath Advair 230/21 inhale once daily -Medications previously tried: n/a  -Exacerbations requiring treatment in last 6 months: none -Patient reports consistent use of maintenance inhaler -Frequency of rescue inhaler use: none in last 6 months -Counseled on Proper inhaler technique; Benefits of consistent maintenance inhaler use When to use rescue inhaler Differences between maintenance and rescue inhalers -Recommended to continue current medication  Depression/Anxiety (Goal: minimize symptoms) -Controlled -Current  treatment: Clonazepam 108m22m1 tablet twice daily -Medications previously tried/failed: n/a -PHQ9: n/a -GAD7: n/a -Educated on Benefits of medication for symptom control -Recommended to continue current medication   Osteopenia (Goal prevent fractures) -Controlled -Last DEXA Scan: 07/31/19  T-Score femoral neck: -2.0  T-Score total hip: n/a  T-Score lumbar spine: -2.0  T-Score forearm radius: n/a  10-year probability of major osteoporotic fracture: unknown  10-year probability of hip fracture: unknown -Patient is not a candidate for pharmacologic treatment -Current treatment  Vitamin D3 50,000 units, 1 capsule once a week Calcium 500 mg, 1 tablet once daily  -Medications previously tried: n/a  -Recommend [phone] units of vitamin D daily. Recommend 1200 mg of calcium daily from dietary and supplemental sources. Recommend weight-bearing and muscle strengthening exercises for building and maintaining bone density. -Recommended to continue current medication  GERD/Heartburn (Goal: minimize symptoms) -Controlled -Current treatment  Calcium carbonate (Tums) 500m3108mhew 1 tablet daily as needed for indigestion or heartburn -Medications previously tried: none  -Recommended to continue current medication  Colitis (Goal: minimize symptoms) -Controlled -Current treatment  Lipase/ protease/ amylase (Creon) 12,000 units, 1 capsule once daily with breakfast  (taking as needed) -Medications previously tried: none  -Recommended to continue current medication Assessed patient finances. Patient may qualify for creon PAP. Plan to mail to patient.  Atopic dermatitis (Goal: minimize symptoms) -Not ideally controlled -Current treatment  Betamethasone dipropionate 0.05% ointment, apply two times daily. Limit to no longer than 2 weeks use. Not on face. Desoximetasone 0.252.58%tment, 1 application twice daily Diphenhydramine (Benadryl) 25mg59mtablet at bedtime as needed Epipen as  needed -Medications previously tried: n/a  -Recommended discussing results with Dr. PanosRegis Billatient reports she underwent extensive allergy testing. Scheduled  PCP appointment to discuss.  Allergic rhinitis (Goal: minimize symptoms) -Controlled -Current treatment  Fluticasone (Flonase) 60mg/act nasal spray, 2 sprays into both nostrils daily as needed  levocetirizine (Xyzal) 570m 1 tablet everday as needed Sodium chloride 0.65% nasal spray solution, 1 spray into both nostrils as needed for congesion (uses 3 to 4 x/ day)  Olopatadine (Pataday) 0.2% solution, 1 drop in each eye once daily as needed -Medications previously tried: montelukast (lethargy), claritin (ineffective)   -Recommended to continue current medication Counseled on duplication of therapy with Benadryl, Claritin and Xyzal and patient stopped using Claritin due to lack of efficacy.  Seizures (Goal: prevent seizures) -Controlled -Current treatment  levetiracetam 25081m1 tablet three times daily -Medications previously tried: none  -Recommended to continue current medication  Thrush (Goal: minimize symptoms) -Controlled -Current treatment  nystatin 100000 unit/ ml, 5 MLs three times daily as needed thrush -Medications previously tried: none  -Recommended to continue current medication  Pain (Goal: minimize pain) -Controlled -Current treatment  Ibuprofen 200m51m00-400mg68mee times daily as needed for moderate pain Acetaminophen 500mg,59mablet at bedtime -Medications previously tried: n/a  -  Patient had fractured her toe and they saw arthritis on her x-ray; suggested trying a topical product such as Voltaren gel but she prefers not to and has had success with yunkers ointment from medical supply   Vitamin B12 deficiency (Goal: 211-911) -Controlled -Current treatment  Cyanocobalamin 1000 mcg SL 1 tablet daily -Medications previously tried: none  -Recommended to continue current medication   Health  Maintenance -Vaccine gaps: shingles, COVID, tetanus -Current therapy:  Hydrocortisone 25mg s54msitory, place 1 rectally at bedtime as needed for hemorrhoids Potassium chloride 20mEq, 24mblet twice daily -Educated on Cost vs benefit of each product must be carefully weighed by individual consumer -Patient is satisfied with current therapy and denies issues -Recommended to continue current medication  Patient Goals/Self-Care Activities Patient will:  - take medications as prescribed check blood pressure daily, document, and provide at future appointments target a minimum of 150 minutes of moderate intensity exercise weekly  Follow Up Plan: Telephone follow up appointment with care management team member scheduled for: 6 months        Medication Assistance: Application for [employer]  medication assistance program. in process.  Anticipated assistance start date 11/13/20.  See plan of care for additional detail.  Compliance/Adherence/Medication fill history: Care Gaps: Tetanus, shingrix, HIV screen, PAP smear, influenza, PCV vaccine  Star-Rating Drugs: None  Patient's preferred pharmacy is:  [id number], [place] [address] [place] [phone] [phone] box? Yes Pt endorses 100% compliance  We discussed: Current pharmacy is preferred with insurance plan and patient is satisfied with pharmacy services Patient decided to: Continue current medication management strategy  Care Plan and Follow Up Patient Decision:  Patient agrees to Care Plan and Follow-up.  Plan: Telephone follow up appointment with care management team member scheduled for:  3 months  MadelineJeni Salles BCACP ClHainesvilleist [phone]

## 2020-10-14 ENCOUNTER — Telehealth: Payer: Self-pay | Admitting: Internal Medicine

## 2020-10-14 MED ORDER — METOPROLOL TARTRATE 25 MG PO TABS: 25.0000 mg | ORAL_TABLET | Freq: Two times a day (BID) | ORAL | 3 refills | Status: DC

## 2020-10-14 NOTE — Telephone Encounter (Signed)
Pt is calling with questions about her health questions.

## 2020-10-14 NOTE — Telephone Encounter (Signed)
Spoke with pt who states at her last OV with Dr Caryl Comes he requested she discontinue Metoprolol Tartrate.  She is requesting a refill of her Lopressor. Per Dr Caryl Comes may refill Metoprolol as requested.  Pt states she has multiple questions for Dr Caryl Comes related to previous testing and would appreciate a call from Dr Caryl Comes to discuss. Dr Caryl Comes notified of pt's request.

## 2020-10-17 NOTE — Telephone Encounter (Signed)
My chart video visit scheduled for 08/24

## 2020-10-17 NOTE — Telephone Encounter (Signed)
Left detailed message for the pt to call and schedule OV to discuss medications.

## 2020-10-19 ENCOUNTER — Encounter: Payer: Self-pay | Admitting: Internal Medicine

## 2020-10-19 ENCOUNTER — Telehealth (INDEPENDENT_AMBULATORY_CARE_PROVIDER_SITE_OTHER): Payer: Medicare Other | Admitting: Internal Medicine

## 2020-10-19 VITALS — Temp 98.2°F | Ht 63.0 in | Wt 145.0 lb

## 2020-10-19 DIAGNOSIS — Z8719 Personal history of other diseases of the digestive system: Secondary | ICD-10-CM | POA: Diagnosis not present

## 2020-10-19 DIAGNOSIS — Z79899 Other long term (current) drug therapy: Secondary | ICD-10-CM | POA: Diagnosis not present

## 2020-10-19 DIAGNOSIS — E538 Deficiency of other specified B group vitamins: Secondary | ICD-10-CM

## 2020-10-19 DIAGNOSIS — Z789 Other specified health status: Secondary | ICD-10-CM

## 2020-10-19 DIAGNOSIS — F419 Anxiety disorder, unspecified: Secondary | ICD-10-CM

## 2020-10-19 MED ORDER — CLONAZEPAM 1 MG PO TABS: 1.0000 mg | ORAL_TABLET | Freq: Two times a day (BID) | ORAL | 1 refills | Status: DC

## 2020-10-19 MED ORDER — CYANOCOBALAMIN 1000 MCG/ML IJ SOLN: 1000.0000 ug | INTRAMUSCULAR | 12 refills | Status: DC

## 2020-10-19 MED ORDER — "SYRINGE 27G X 1-1/4"" 3 ML MISC": 1.0000 mL | 0 refills | Status: AC

## 2020-10-19 NOTE — Progress Notes (Signed)
Virtual Visit via Video Note  I connected withNAME@ on 10/23/20 at 12:30 PM EDT by a video enabled telemedicine application and verified that I am speaking with the correct person using two identifiers. Location patient: home Location provider:work office Persons participating in the virtual visit: patient, provider and spouse  [place] national recommendations  regarding COVID 19 pandemic   video visit is advised over in office visit for this patient.  Patient aware  of the limitations of evaluation and management by telemedicine and  availability of in person appointments. and agreed to proceed.   HPI: [name] presents for video visit in order to  obtain b12  injections  and clonazepam rx  from [facility] and speciality teams .  B12 inj given at home by husband  for  about 2.5 years   every 4 weeks and needs  refills transferred .   Last cbc 5 19 [facility]   Anxiety  rx onset   med at metastatic melanoma dx  in past  with help.  May have been on controller meds in remote past Wellbutrin past  not indicated recently .   Recently  with GI sx and to undergo diagnostic  Gi procedures other  taking often tid  am mid day and hs    ROS: See pertinent positives and negatives per HPI. Recently had ed visit not completed     rx dr Sarajane Jews for abd pain felt diverticulitis      better with antibiotic.   Past Medical History:  Diagnosis Date   Angio-edema    Arthritis    Asthma    Atrial fibrillation (HCC)    Atrial tachycardia ([place])    Cervicalgia    Complication of anesthesia    pt states wakes up with "shakes"   CVA (cerebral infarction)    2012 with dizziness and vision change felt embolic from atrial tachy   Disorder of bone and cartilage, unspecified    Disturbance of skin sensation    Diverticulosis    DM (diabetes mellitus) ([place])    DVT (deep venous thrombosis) (HCC)    DVT (deep venous thrombosis) ([place])    Eczema    Family history of anesthesia complication    PONV   Fatty liver     GERD (gastroesophageal reflux disease)    History of hiatal hernia    History of pacemaker    History of radiation therapy 10/24/12   brain   HLD (hyperlipidemia)    HTN (hypertension)    Hypoglycemia, unspecified    Injury to unspecified nerve of shoulder girdle and upper limb    Liver metastasis (HCC)    Lung metastasis (HCC)    Metastasis to brain (HCC)    Osteopenia    Other nonspecific abnormal serum enzyme levels    Other specified congenital anomalies of nervous system    Pacemaker    autonomic dysfunction   Pancreatitis    from therapy   Peripheral vascular disease (HCC)    Pneumonia    PONV (postoperative nausea and vomiting)    POTS (postural orthostatic tachycardia syndrome)    Recurrent upper respiratory infection (URI)    Skin cancer    Hx: of lung lesion   Stroke ([place])    left weaker    Swelling of limb    Urticaria     Past Surgical History:  Procedure Laterality Date   ABLATION SAPHENOUS VEIN W/ RFA     2002   ADENOIDECTOMY     CARPAL  TUNNEL RELEASE Left    CHOLECYSTECTOMY  03/28/2018   CHOLECYSTECTOMY N/A 03/28/2018   Procedure: LAPAROSCOPIC CHOLECYSTECTOMY WITH INTRAOPERATIVE CHOLANGIOGRAM;  Surgeon: Johnathan Hausen, MD;  Location: [place];  Service: General;  Laterality: N/A;   CRANIOTOMY N/A 09/30/2012   suboccipital craniectomy   CRANIOTOMY Left 02/09/2013   Procedure: LEFT PARIETAL CRANIOTOMY with stealth;  Surgeon: [name], MD;  Location: [facility];  Service: Neurosurgery;  Laterality: Left;  LEFT Parietal Craniotomy for tumor with stealth   DEEP AXILLARY SENTINEL NODE BIOPSY / EXCISION     due to extensive Melanoma-right arm   fatty tumor removed     from chest   INSERT / REPLACE / REMOVE PACEMAKER  2012   1999, x 3   LAPAROSCOPY  09/06/2011   Procedure: LAPAROSCOPY OPERATIVE;  Surgeon: Claiborne Billings A. Pamala Hurry, MD;  Location: [facility];  Service: Gynecology;  Laterality: Left;  with Left Ovarian Cystectomy    MELANOMA EXCISION     with removal of  lymph nodes, left shoulder   NOSE SURGERY  2010, 2012   for nose bleeds x 2   PACEMAKER IMPLANT     SINOSCOPY     TONSILLECTOMY AND ADENOIDECTOMY      Family History  Problem Relation Age of Onset   Allergic rhinitis Sister    Stroke Mother    Colon polyps Mother    Colon cancer Mother    Urticaria Mother    Allergic rhinitis Mother    Heart disease Father    Diabetes Father    Kidney disease Father    Diabetes Maternal Grandmother    Clotting disorder Maternal Grandmother        stroke   Crohn's disease Maternal Grandmother    Diabetes Paternal Grandmother    Aneurysm Sister        brain    Social History   Tobacco Use   Smoking status: Never   Smokeless tobacco: Never  Vaping Use   Vaping Use: Never used  Substance Use Topics   Alcohol use: Not Currently    Comment: glass of wine daily   Drug use: No      Current Outpatient Medications:    acetaminophen (TYLENOL) 500 MG tablet, Take 500 mg by mouth at bedtime., Disp: , Rfl:    albuterol (PROVENTIL) (2.5 MG/3ML) 0.083% nebulizer solution, Take 3 mLs (2.5 mg total) by nebulization every 4 (four) hours as needed for wheezing or shortness of breath., Disp: 180 mL, Rfl: 1   albuterol (VENTOLIN HFA) 108 (90 Base) MCG/ACT inhaler, INHALE 2 PUFFS INTO THE LUNGS EVERY 4 (FOUR) HOURS AS NEEDED FOR WHEEZING OR SHORTNESS OF BREATH., Disp: 18 Inhaler, Rfl: 0   augmented betamethasone dipropionate (DIPROLENE) 0.05 % ointment, Apply topically 2 (two) times daily. Limit to no longer than 2 weeks use. Not on face., Disp: 50 g, Rfl: 0   calcium carbonate (TUMS - DOSED IN MG ELEMENTAL CALCIUM) 500 MG chewable tablet, Chew 1 tablet by mouth daily as needed for indigestion or heartburn. , Disp: , Rfl:    CALCIUM PO, Take 1 tablet by mouth every 30 (thirty) days., Disp: , Rfl:    cholestyramine (QUESTRAN) 4 g packet, 1 packet mixed with water or non-carbonated drink, Disp: , Rfl:    clobetasol cream (TEMOVATE) 0.05 %, , Disp: , Rfl:     cloNIDine (CATAPRES) 0.1 MG tablet, Take 1 tablet (0.1 mg total) by mouth at bedtime., Disp: 90 tablet, Rfl: 3   cyanocobalamin (,VITAMIN B-12,) 1000 MCG/ML  injection, Inject 1 mL (1,000 mcg total) into the muscle every 30 (thirty) days., Disp: 1 mL, Rfl: 12   Desoximetasone (TOPICORT) 0.25 % ointment, Apply 1 application topically 2 (two) times daily., Disp: 30 g, Rfl: 1   diphenhydrAMINE (BENADRYL) 25 MG tablet, Take 25 mg by mouth at bedtime as needed for allergies or sleep. , Disp: , Rfl:    EPINEPHrine 0.3 mg/0.3 mL IJ SOAJ injection, SMARTSIG:0.3 Milliliter(s) IM Once PRN, Disp: , Rfl:    fluorometholone (FML) 0.1 % ophthalmic suspension, Place 1 drop into both eyes 4 (four) times daily as needed (itching). , Disp: , Rfl: 1   fluorouracil (EFUDEX) 5 % cream, 1 application, Disp: , Rfl:    fluticasone (FLOVENT HFA) 44 MCG/ACT inhaler, 2 puffs, Disp: , Rfl:    hydrocortisone (ANUSOL-HC) 25 MG suppository, Place 1 suppository (25 mg total) rectally at bedtime as needed for hemorrhoids., Disp: 12 suppository, Rfl: 0   hydrocortisone 2.5 % cream, Apply 1 application topically 2 (two) times daily as needed (irritation)., Disp: 30 g, Rfl: 0   hyoscyamine (LEVSIN SL) 0.125 MG SL tablet, Place under the tongue., Disp: , Rfl:    ibuprofen (ADVIL,MOTRIN) 200 MG tablet, Take 200-400 mg by mouth 3 (three) times daily as needed for moderate pain. , Disp: , Rfl:    levETIRAcetam (KEPPRA) 250 MG tablet, Take 250 mg by mouth 3 (three) times daily. 6 am, 12 pm, 6 pm, Disp: , Rfl:    levocetirizine (XYZAL) 5 MG tablet, TAKE 1 TABLET BY MOUTH EVERY DAY AS NEEDED, Disp: 30 tablet, Rfl: 0   lipase/protease/amylase (CREON) 12000 units CPEP capsule, Take 12,000 Units by mouth daily with breakfast., Disp: , Rfl:    Loteprednol-Tobramycin (ZYLET) 0.5-0.3 % SUSP, Administer 1 drop to the right eye daily as needed., Disp: , Rfl:    metoprolol tartrate (LOPRESSOR) 25 MG tablet, Take 1 tablet (25 mg total) by mouth 2 (two)  times daily., Disp: 180 tablet, Rfl: 3   montelukast (SINGULAIR) 10 MG tablet, Take 1 tablet (10 mg total) by mouth at bedtime., Disp: 30 tablet, Rfl: 5   nystatin (MYCOSTATIN) 100000 UNIT/ML suspension, Use as directed 5 mLs (500,000 Units total) in the mouth or throat 3 (three) times daily as needed (thrush)., Disp: 60 mL, Rfl: 2   Olopatadine HCl (PATADAY) 0.2 % SOLN, Use one drop in each eye once daily as needed., Disp: 2.5 mL, Rfl: 5   polyethylene glycol (MIRALAX / GLYCOLAX) packet, Take 17 g by mouth at bedtime., Disp: , Rfl:    potassium chloride SA (K-DUR,KLOR-CON) 20 MEQ tablet, Take 1 tablet (20 mEq total) by mouth 2 (two) times daily., Disp: 60 tablet, Rfl: 3   prochlorperazine (COMPAZINE) 10 MG tablet, Take 1 tablet (10 mg total) by mouth every 6 (six) hours as needed for nausea or vomiting., Disp: 60 tablet, Rfl: 0   sodium chloride (OCEAN) 0.65 % SOLN nasal spray, Place 1 spray into both nostrils as needed for congestion. , Disp: , Rfl:    Spacer/Aero-Holding Chambers ([place]) MISC, See admin instructions., Disp: , Rfl:    Vitamin D, Ergocalciferol, (DRISDOL) 50000 units CAPS capsule, TAKE 1 CAPSULE (50,000 UNITS TOTAL) BY MOUTH EVERY 7 (SEVEN) DAYS. (Patient taking differently: Take 50,000 Units by mouth See admin instructions.), Disp: 4 capsule, Rfl: 11   clonazePAM (KLONOPIN) 1 MG tablet, Take 1 tablet (1 mg total) by mouth 2 (two) times daily. May take 3 x per day if needed for extra anxiety, Disp: 69  tablet, Rfl: 1   Syringe/Needle, Disp, (SYRINGE 3CC/27GX1-1/4") 27G X 1-1/4" 3 ML MISC, Inject 1 mL into the muscle every 30 (thirty) days. Vit B12, Disp: 12 each, Rfl: 0  EXAM: BP Readings from Last 3 Encounters:  08/03/20 102/74  08/02/20 (!) 145/89  07/27/20 120/70    VITALS per patient if applicable:  GENERAL: alert, oriented, appears well and in no acute distress  HEENT: atraumatic, conjunttiva clear, no obvious abnormalities on inspection of external nose  and ears  NECK: normal movements of the head and neck  LUNGS: on inspection no signs of respiratory distress, breathing rate appears normal, no obvious gross SOB, gasping or wheezing  CV: no obvious cyanosis  PSYCH/NEURO: pleasant and cooperative, no obvious depression or anxiety, speech and thought processing grossly intact Lab Results  Component Value Date   WBC 6.0 03/28/2018   HGB 13.5 03/28/2018   HCT 37.9 03/28/2018   PLT 225 03/28/2018   GLUCOSE 89 05/27/2015   CHOL 145 02/09/2014   TRIG 123.0 02/09/2014   HDL 47.90 02/09/2014   LDLDIRECT 109.7 10/11/2010   LDLCALC 73 02/09/2014   ALT 27 08/09/2020   AST 27 08/09/2020   NA 140 05/27/2015   K 4.1 05/27/2015   CL 102 11/12/2013   CREATININE 1.10 (H) 05/19/2019   BUN 18.8 05/27/2015   CO2 27 05/27/2015   TSH 1.146 12/17/2013   INR 1.03 08/28/2013   HGBA1C 5.9 07/15/2018   Lab Results  Component Value Date   [id number] 275 10/24/2009  Record revewi  care everywhere and labs    ASSESSMENT AND PLAN:  Discussed the following assessment and plan:    ICD-10-CM   1. Vitamin B 12 deficiency  E53.8     2. Medication management  Z79.899     3. Anxiety  F41.9     4. Medically complex patient  Z78.9     5. History of diverticulitis  Z87.19     Continue  b12 injections  at home  and occasional  monitoring   Will refill clonazepam and may not be long term solution control can reassess after  Gi eval  help and settled  Pt aware  Risk/ benefit of medication discussed.   Counseled.   Expectant management and discussion of plan and treatment with opportunity to ask questions and all were answered. The patient agreed with the plan and demonstrated an understanding of the instructions.  review  visit plan and counsel 32 minutes Advised to call back or seek an in-person evaluation if worsening  or having  further concerns . In interim  Return for as indicated and after evaluations . [name], MD

## 2020-10-20 DIAGNOSIS — K769 Liver disease, unspecified: Secondary | ICD-10-CM | POA: Diagnosis not present

## 2020-10-20 DIAGNOSIS — K3189 Other diseases of stomach and duodenum: Secondary | ICD-10-CM | POA: Diagnosis not present

## 2020-10-20 DIAGNOSIS — Z85820 Personal history of malignant melanoma of skin: Secondary | ICD-10-CM | POA: Diagnosis not present

## 2020-10-20 DIAGNOSIS — Z950 Presence of cardiac pacemaker: Secondary | ICD-10-CM | POA: Diagnosis not present

## 2020-10-20 DIAGNOSIS — Z882 Allergy status to sulfonamides status: Secondary | ICD-10-CM | POA: Diagnosis not present

## 2020-10-20 DIAGNOSIS — R569 Unspecified convulsions: Secondary | ICD-10-CM | POA: Diagnosis not present

## 2020-10-20 DIAGNOSIS — F419 Anxiety disorder, unspecified: Secondary | ICD-10-CM | POA: Diagnosis not present

## 2020-10-20 DIAGNOSIS — R12 Heartburn: Secondary | ICD-10-CM | POA: Diagnosis not present

## 2020-10-20 DIAGNOSIS — I10 Essential (primary) hypertension: Secondary | ICD-10-CM | POA: Diagnosis not present

## 2020-10-20 DIAGNOSIS — E119 Type 2 diabetes mellitus without complications: Secondary | ICD-10-CM | POA: Diagnosis not present

## 2020-10-20 DIAGNOSIS — Z880 Allergy status to penicillin: Secondary | ICD-10-CM | POA: Diagnosis not present

## 2020-10-20 DIAGNOSIS — K529 Noninfective gastroenteritis and colitis, unspecified: Secondary | ICD-10-CM | POA: Diagnosis not present

## 2020-10-20 DIAGNOSIS — Z79899 Other long term (current) drug therapy: Secondary | ICD-10-CM | POA: Diagnosis not present

## 2020-10-20 DIAGNOSIS — K449 Diaphragmatic hernia without obstruction or gangrene: Secondary | ICD-10-CM | POA: Diagnosis not present

## 2020-10-20 DIAGNOSIS — K219 Gastro-esophageal reflux disease without esophagitis: Secondary | ICD-10-CM | POA: Diagnosis not present

## 2020-10-20 DIAGNOSIS — Z8673 Personal history of transient ischemic attack (TIA), and cerebral infarction without residual deficits: Secondary | ICD-10-CM | POA: Diagnosis not present

## 2020-10-24 DIAGNOSIS — K3189 Other diseases of stomach and duodenum: Secondary | ICD-10-CM | POA: Diagnosis not present

## 2020-10-24 DIAGNOSIS — K449 Diaphragmatic hernia without obstruction or gangrene: Secondary | ICD-10-CM | POA: Diagnosis not present

## 2020-10-24 DIAGNOSIS — K219 Gastro-esophageal reflux disease without esophagitis: Secondary | ICD-10-CM | POA: Diagnosis not present

## 2020-10-27 DIAGNOSIS — K259 Gastric ulcer, unspecified as acute or chronic, without hemorrhage or perforation: Secondary | ICD-10-CM | POA: Diagnosis not present

## 2020-10-27 DIAGNOSIS — K449 Diaphragmatic hernia without obstruction or gangrene: Secondary | ICD-10-CM | POA: Diagnosis not present

## 2020-10-27 DIAGNOSIS — K219 Gastro-esophageal reflux disease without esophagitis: Secondary | ICD-10-CM | POA: Diagnosis not present

## 2020-10-28 ENCOUNTER — Telehealth: Payer: Self-pay | Admitting: Pharmacist

## 2020-10-28 NOTE — Chronic Care Management (AMB) (Signed)
Chronic Care Management Pharmacy Assistant   Name: [name]  MRN: [id number] DOB: Nov 03, 1955  Completed patient assistance application for [facility] per Jeni Salles request. Patient is aware per [facility]. Patient does not need to be called. Patient assistance application for [facility] Assist [facility] to be printed and mailed to patient on 11/04/20.   Medications: Outpatient Encounter Medications as of 10/28/2020  Medication Sig   acetaminophen (TYLENOL) 500 MG tablet Take 500 mg by mouth at bedtime.   albuterol (PROVENTIL) (2.5 MG/3ML) 0.083% nebulizer solution Take 3 mLs (2.5 mg total) by nebulization every 4 (four) hours as needed for wheezing or shortness of breath.   albuterol (VENTOLIN HFA) 108 (90 Base) MCG/ACT inhaler INHALE 2 PUFFS INTO THE LUNGS EVERY 4 (FOUR) HOURS AS NEEDED FOR WHEEZING OR SHORTNESS OF BREATH.   augmented betamethasone dipropionate (DIPROLENE) 0.05 % ointment Apply topically 2 (two) times daily. Limit to no longer than 2 weeks use. Not on face.   calcium carbonate (TUMS - DOSED IN MG ELEMENTAL CALCIUM) 500 MG chewable tablet Chew 1 tablet by mouth daily as needed for indigestion or heartburn.    CALCIUM PO Take 1 tablet by mouth every 30 (thirty) days.   cholestyramine (QUESTRAN) 4 g packet 1 packet mixed with water or non-carbonated drink   clobetasol cream (TEMOVATE) 0.05 %    clonazePAM (KLONOPIN) 1 MG tablet Take 1 tablet (1 mg total) by mouth 2 (two) times daily. May take 3 x per day if needed for extra anxiety   cloNIDine (CATAPRES) 0.1 MG tablet Take 1 tablet (0.1 mg total) by mouth at bedtime.   cyanocobalamin (,VITAMIN B-12,) 1000 MCG/ML injection Inject 1 mL (1,000 mcg total) into the muscle every 30 (thirty) days.   Desoximetasone (TOPICORT) 0.25 % ointment Apply 1 application topically 2 (two) times daily.   diphenhydrAMINE (BENADRYL) 25 MG tablet Take 25 mg by mouth at bedtime as needed for allergies or sleep.    EPINEPHrine 0.3 mg/0.3 mL IJ SOAJ  injection SMARTSIG:0.3 Milliliter(s) IM Once PRN   fluorometholone (FML) 0.1 % ophthalmic suspension Place 1 drop into both eyes 4 (four) times daily as needed (itching).    fluorouracil (EFUDEX) 5 % cream 1 application   fluticasone (FLOVENT HFA) 44 MCG/ACT inhaler 2 puffs   hydrocortisone (ANUSOL-HC) 25 MG suppository Place 1 suppository (25 mg total) rectally at bedtime as needed for hemorrhoids.   hydrocortisone 2.5 % cream Apply 1 application topically 2 (two) times daily as needed (irritation).   hyoscyamine (LEVSIN SL) 0.125 MG SL tablet Place under the tongue.   ibuprofen (ADVIL,MOTRIN) 200 MG tablet Take 200-400 mg by mouth 3 (three) times daily as needed for moderate pain.    levETIRAcetam (KEPPRA) 250 MG tablet Take 250 mg by mouth 3 (three) times daily. 6 am, 12 pm, 6 pm   levocetirizine (XYZAL) 5 MG tablet TAKE 1 TABLET BY MOUTH EVERY DAY AS NEEDED   lipase/protease/amylase (CREON) 12000 units CPEP capsule Take 12,000 Units by mouth daily with breakfast.   Loteprednol-Tobramycin (ZYLET) 0.5-0.3 % SUSP Administer 1 drop to the right eye daily as needed.   metoprolol tartrate (LOPRESSOR) 25 MG tablet Take 1 tablet (25 mg total) by mouth 2 (two) times daily.   montelukast (SINGULAIR) 10 MG tablet Take 1 tablet (10 mg total) by mouth at bedtime.   nystatin (MYCOSTATIN) 100000 UNIT/ML suspension Use as directed 5 mLs (500,000 Units total) in the mouth or throat 3 (three) times daily as needed (thrush).   Olopatadine  HCl (PATADAY) 0.2 % SOLN Use one drop in each eye once daily as needed.   polyethylene glycol (MIRALAX / GLYCOLAX) packet Take 17 g by mouth at bedtime.   potassium chloride SA (K-DUR,KLOR-CON) 20 MEQ tablet Take 1 tablet (20 mEq total) by mouth 2 (two) times daily.   prochlorperazine (COMPAZINE) 10 MG tablet Take 1 tablet (10 mg total) by mouth every 6 (six) hours as needed for nausea or vomiting.   sodium chloride (OCEAN) 0.65 % SOLN nasal spray Place 1 spray into both  nostrils as needed for congestion.    Spacer/Aero-Holding Chambers ([place]) MISC See admin instructions.   Syringe/Needle, Disp, (SYRINGE 3CC/27GX1-1/4") 27G X 1-1/4" 3 ML MISC Inject 1 mL into the muscle every 30 (thirty) days. Vit B12   Vitamin D, Ergocalciferol, (DRISDOL) 50000 units CAPS capsule TAKE 1 CAPSULE (50,000 UNITS TOTAL) BY MOUTH EVERY 7 (SEVEN) DAYS. (Patient taking differently: Take 50,000 Units by mouth See admin instructions.)   No facility-administered encounter medications on file as of 10/28/2020.    Care Gaps:  AWV - scheduled for 03/28/21 HIV screening - never done Hepatitis C screening - never done Tetanus/TDAP - never done Zoster vaccine - never done Pap smear - never done Pneumonia vaccine - overdue since 2015 Flu vaccine - due  Star Rating Drugs:  None.  [place]  Clinical Pharmacist Assistant [phone]

## 2020-10-31 ENCOUNTER — Other Ambulatory Visit: Payer: Self-pay

## 2020-10-31 ENCOUNTER — Ambulatory Visit
Admission: EM | Admit: 2020-10-31 | Discharge: 2020-10-31 | Disposition: A | Discharge status: Home / Self Care | Payer: Medicare Other | Attending: Physician Assistant | Admitting: Physician Assistant

## 2020-10-31 DIAGNOSIS — Z20822 Contact with and (suspected) exposure to covid-19: Secondary | ICD-10-CM | POA: Diagnosis not present

## 2020-10-31 NOTE — Discharge Instructions (Signed)
Covid Results should be back in about 24-48 hours. You can also look for results on MyChart (we will call for any positive results).

## 2020-10-31 NOTE — ED Triage Notes (Signed)
Patient states she does not have any symptoms of Covid-19 but would like to be tested.

## 2020-11-02 LAB — SARS-COV-2, NAA 2 DAY TAT

## 2020-11-02 LAB — NOVEL CORONAVIRUS, NAA: SARS-CoV-2, NAA: NOT DETECTED

## 2020-11-02 NOTE — Telephone Encounter (Signed)
Spoke to patient, patient notified of Covid result from previous visit.

## 2020-11-02 NOTE — Telephone Encounter (Signed)
Spoke with patient regarding Covid-19 lab results (patient made aware nurse called [facility] to check on status of lab result) patient made aware that results might take 1-3 days per [facility] to result and that results could be delayed d/t testing being done on a holiday (10/31/20). Patient made aware that someone from urgent care will call with any positive results. Lastly nurse answered questions regarding antiviral medications.

## 2020-11-06 DIAGNOSIS — U071 COVID-19: Secondary | ICD-10-CM | POA: Diagnosis not present

## 2020-11-08 NOTE — Progress Notes (Signed)
Orders requested with Dr. Earlie Server office.

## 2020-11-09 DIAGNOSIS — H43392 Other vitreous opacities, left eye: Secondary | ICD-10-CM | POA: Diagnosis not present

## 2020-11-10 ENCOUNTER — Ambulatory Visit: Payer: Self-pay | Admitting: Surgery

## 2020-11-16 NOTE — Patient Instructions (Addendum)
DUE TO COVID-19 ONLY ONE VISITOR IS ALLOWED TO COME WITH YOU AND STAY IN THE WAITING ROOM ONLY DURING PRE OP AND PROCEDURE.   **NO VISITORS ARE ALLOWED IN THE SHORT STAY AREA OR RECOVERY ROOM!!**  IF YOU WILL BE ADMITTED INTO THE HOSPITAL YOU ARE ALLOWED ONLY TWO SUPPORT PEOPLE DURING VISITATION HOURS ONLY (10AM -8PM)   The support person(s) may change daily. The support person(s) must pass our screening, gel in and out, and wear a mask at all times, including in the patient's room. Patients must also wear a mask when staff or their support person are in the room.  No visitors under the age of 61. Any visitor under the age of 87 must be accompanied by an adult.    COVID SWAB TESTING MUST BE COMPLETED ON:  11/25/20 **MUST PRESENT COMPLETED FORM AT TESTING SITE**    [facility] (backside of the building) You are not required to quarantine, however you are required to wear a well-fitted mask when you are out and around people not in your household.  Hand Hygiene often Do NOT share personal items Notify your provider if you are in close contact with someone who has COVID or you develop fever 100.4 or greater, new onset of sneezing, cough, sore throat, shortness of breath or body aches.  [place], [address], must go inside of the hospital, NOT A DRIVE THRU!  (Must self quarantine after testing. Follow instructions on handout.)       Your procedure is scheduled on: 11/29/20   Report to [facility] Main Entrance    Report to admitting at: 12:00 PM   Call this number if you have problems the morning of surgery [phone]   Do not eat food :After Midnight.   May have liquids until : 11:00 AM   day of surgery  CLEAR LIQUID DIET  Foods Allowed                                                                     Foods Excluded  Water, Black Coffee and tea, regular and decaf                              liquids that you cannot  Plain Jell-O in any flavor  (No red)                                           see through such as: Fruit ices (not with fruit pulp)                                     milk, soups, orange juice              Iced Popsicles (No red)  All solid food                                   Apple juices Sports drinks like Gatorade (No red) Lightly seasoned clear broth or consume(fat free) Sugar,   Sample Menu Breakfast                                Lunch                                     Supper Cranberry juice                    Beef broth                            Chicken broth Jell-O                                     Grape juice                           Apple juice Coffee or tea                        Jell-O                                      Popsicle                                                Coffee or tea                        Coffee or tea      Oral Hygiene is also important to reduce your risk of infection.                                    Remember - BRUSH YOUR TEETH THE MORNING OF SURGERY WITH YOUR REGULAR TOOTHPASTE   Do NOT smoke after Midnight   Take these medicines the morning of surgery with A SIP OF WATER: kepra,levocetirizine,clonidine,metoprolol,creon.Clonazepam as needed.Use inhalers and eye drops as usual.  DO NOT TAKE ANY ORAL DIABETIC MEDICATIONS DAY OF YOUR SURGERY                              You may not have any metal on your body including hair pins, jewelry, and body piercing             Do not wear make-up, lotions, powders, perfumes/cologne, or deodorant  Do not wear nail polish including gel and S&S, artificial/acrylic nails, or any other type of covering on natural nails including finger and toenails. If you have artificial nails, gel coating, etc. that needs to be removed by a nail  salon please have this removed prior to surgery or surgery may need to be canceled/ delayed if the  surgeon/ anesthesia feels like they are unable to be safely monitored.   Do not shave  48 hours prior to surgery.    Do not bring valuables to the hospital. [place].   Contacts, dentures or bridgework may not be worn into surgery.   Bring small overnight bag day of surgery.    Patients discharged on the day of surgery will not be allowed to drive home.   Special Instructions: Bring a copy of your healthcare power of attorney and living will documents         the day of surgery if you haven't scanned them before.              Please read over the following fact sheets you were given: IF YOU HAVE QUESTIONS ABOUT YOUR PRE-OP INSTRUCTIONS PLEASE CALL [phone]   [facility] - Preparing for Surgery Before surgery, you can play an important role.  Because skin is not sterile, your skin needs to be as free of germs as possible.  You can reduce the number of germs on your skin by washing with CHG (chlorahexidine gluconate) soap before surgery.  CHG is an antiseptic cleaner which kills germs and bonds with the skin to continue killing germs even after washing. Please DO NOT use if you have an allergy to CHG or antibacterial soaps.  If your skin becomes reddened/irritated stop using the CHG and inform your nurse when you arrive at Short Stay. Do not shave (including legs and underarms) for at least 48 hours prior to the first CHG shower.  You may shave your face/neck. Please follow these instructions carefully:  1.  Shower with CHG Soap the night before surgery and the  morning of Surgery.  2.  If you choose to wash your hair, wash your hair first as usual with your  normal  shampoo.  3.  After you shampoo, rinse your hair and body thoroughly to remove the  shampoo.                           4.  Use CHG as you would any other liquid soap.  You can apply chg directly  to the skin and wash                       Gently with a scrungie or clean  washcloth.  5.  Apply the CHG Soap to your body ONLY FROM THE NECK DOWN.   Do not use on face/ open                           Wound or open sores. Avoid contact with eyes, ears mouth and genitals (private parts).                       Wash face,  Genitals (private parts) with your normal soap.             6.  Wash thoroughly, paying special attention to the area where your surgery  will be performed.  7.  Thoroughly rinse your body with warm water from the neck down.  8.  DO NOT shower/wash with your normal soap after  using and rinsing off  the CHG Soap.                9.  Pat yourself dry with a clean towel.            10.  Wear clean pajamas.            11.  Place clean sheets on your bed the night of your first shower and do not  sleep with pets. Day of Surgery : Do not apply any lotions/deodorants the morning of surgery.  Please wear clean clothes to the hospital/surgery center.  FAILURE TO FOLLOW THESE INSTRUCTIONS MAY RESULT IN THE CANCELLATION OF YOUR SURGERY PATIENT SIGNATURE_________________________________  NURSE SIGNATURE__________________________________  ________________________________________________________________________

## 2020-11-17 ENCOUNTER — Encounter (HOSPITAL_COMMUNITY)
Admission: RE | Admit: 2020-11-17 | Discharge: 2020-11-17 | Disposition: A | Discharge status: Home / Self Care | Payer: Medicare Other | Source: Ambulatory Visit | Attending: Surgery | Admitting: Surgery

## 2020-11-17 ENCOUNTER — Encounter (HOSPITAL_COMMUNITY): Payer: Self-pay

## 2020-11-17 ENCOUNTER — Encounter (HOSPITAL_COMMUNITY): Payer: Self-pay | Admitting: Physician Assistant

## 2020-11-17 ENCOUNTER — Encounter: Payer: Self-pay | Admitting: Internal Medicine

## 2020-11-17 ENCOUNTER — Other Ambulatory Visit: Payer: Self-pay

## 2020-11-17 DIAGNOSIS — Z85820 Personal history of malignant melanoma of skin: Secondary | ICD-10-CM | POA: Diagnosis not present

## 2020-11-17 DIAGNOSIS — I10 Essential (primary) hypertension: Secondary | ICD-10-CM | POA: Diagnosis not present

## 2020-11-17 DIAGNOSIS — Z79899 Other long term (current) drug therapy: Secondary | ICD-10-CM | POA: Insufficient documentation

## 2020-11-17 DIAGNOSIS — K259 Gastric ulcer, unspecified as acute or chronic, without hemorrhage or perforation: Secondary | ICD-10-CM | POA: Insufficient documentation

## 2020-11-17 DIAGNOSIS — I4891 Unspecified atrial fibrillation: Secondary | ICD-10-CM | POA: Diagnosis not present

## 2020-11-17 DIAGNOSIS — Z950 Presence of cardiac pacemaker: Secondary | ICD-10-CM | POA: Insufficient documentation

## 2020-11-17 DIAGNOSIS — K449 Diaphragmatic hernia without obstruction or gangrene: Secondary | ICD-10-CM | POA: Insufficient documentation

## 2020-11-17 DIAGNOSIS — Z01812 Encounter for preprocedural laboratory examination: Secondary | ICD-10-CM | POA: Insufficient documentation

## 2020-11-17 HISTORY — DX: Anemia, unspecified: D64.9

## 2020-11-17 HISTORY — DX: Dyspnea, unspecified: R06.00

## 2020-11-17 LAB — BASIC METABOLIC PANEL
Anion gap: 8 (ref 5–15)
BUN: 13 mg/dL (ref 8–23)
CO2: 27 mmol/L (ref 22–32)
Calcium: 9.5 mg/dL (ref 8.9–10.3)
Chloride: 105 mmol/L (ref 98–111)
Creatinine, Ser: 0.63 mg/dL (ref 0.44–1.00)
GFR, Estimated: >60 mL/min (ref >60)
Glucose, Bld: 99 mg/dL (ref 70–99)
Potassium: 4 mmol/L (ref 3.5–5.1)
Sodium: 140 mmol/L (ref 135–145)

## 2020-11-17 LAB — CBC
HCT: 40.9 % (ref 36.0–46.0)
Hemoglobin: 13.8 g/dL (ref 12.0–15.0)
MCH: 29.7 pg (ref 26.0–34.0)
MCHC: 33.7 g/dL (ref 30.0–36.0)
MCV: 88 fL (ref 80.0–100.0)
Platelets: 259 10*3/uL (ref 150–400)
RBC: 4.65 MIL/uL (ref 3.87–5.11)
RDW: 13.2 % (ref 11.5–15.5)
WBC: 5.7 10*3/uL (ref 4.0–10.5)
nRBC: 0 % (ref 0.0–0.2)

## 2020-11-17 LAB — GLUCOSE, CAPILLARY: Glucose-Capillary: 97 mg/dL (ref 70–99)

## 2020-11-17 NOTE — Progress Notes (Signed)
[facility] DEVICE PROGRAMMING  Patient Information: Name:  [name]  DOB:  11-07-1955  MRN:  [id number]    Joline Maxcy, RN  P Cv Div Heartcare Device Planned Procedure:  Robotic hiatal hernia repair  Surgeon:  Dr. Johnathan Hausen  Date of Procedure:  11/29/20  Cautery will be used.  Position during surgery:     Please send documentation back to:  [facility] (Fax # [phone])   Joline Maxcy, RN  11/17/2020 8:03 AM  Device Information:  Clinic EP Physician:  Virl Axe, MD   Device Type:  Pacemaker Manufacturer and Phone #:  Biotronik: [phone] Pacemaker Dependent?:  Yes.   Date of Last Device Check:  04/12/20 Normal Device Function?:  Yes.    Electrophysiologist's Recommendations:  Have magnet available. Provide continuous ECG monitoring when magnet is used or reprogramming is to be performed.  Procedure may interfere with device function.  Magnet should be placed over device during procedure.  Per Device Clinic Standing Orders, York Ram, RN  2:40 PM 11/17/2020

## 2020-11-17 NOTE — Progress Notes (Addendum)
COVID Vaccine Completed: Yes Date COVID Vaccine completed: 05/01/19. X 1 COVID vaccine manufacturer: [employer]     COVID Test: 11/25/20 PCP - Dr. Zacarias Pontes Cardiologist - Dr. Virl Axe. LOV: 05/31/20  Chest x-ray -  EKG - 04/12/20 Stress Test -  ECHO - 05/31/20 Cardiac Cath -  Pacemaker/ICD device last checked:  Sleep Study -  CPAP -   Fasting Blood Sugar - 82-85 Checks Blood Sugar ___1__ times a day  Blood Thinner Instructions: Aspirin Instructions: Last Dose:  Anesthesia review: Hx: Afib,CVA,DIA,DVT,HTN,Tachycardia  Patient denies shortness of breath, fever, cough and chest pain at PAT appointment   Patient verbalized understanding of instructions that were given to them at the PAT appointment. Patient was also instructed that they will need to review over the PAT instructions again at home before surgery.

## 2020-11-18 LAB — HEMOGLOBIN A1C
Hgb A1c MFr Bld: 5.6 % (ref 4.8–5.6)
Mean Plasma Glucose: 114 mg/dL

## 2020-11-18 NOTE — Anesthesia Preprocedure Evaluation (Deleted)
Anesthesia Evaluation    Airway        Dental   Pulmonary           Cardiovascular hypertension,      Neuro/Psych    GI/Hepatic   Endo/Other  diabetes  Renal/GU      Musculoskeletal   Abdominal   Peds  Hematology   Anesthesia Other Findings   Reproductive/Obstetrics                             Anesthesia Physical Anesthesia Plan  ASA:   Anesthesia Plan:    Post-op Pain Management:    Induction:   PONV Risk Score and Plan:   Airway Management Planned:   Additional Equipment:   Intra-op Plan:   Post-operative Plan:   Informed Consent:   Plan Discussed with:   Anesthesia Plan Comments: (See PAT note 11/17/20, Konrad Felix Ward, PA-C)        Anesthesia Quick Evaluation

## 2020-11-18 NOTE — Progress Notes (Signed)
Anesthesia Chart Review   Case: [id number] Date/Time: 11/29/20 1400   Procedure: XI ROBOTIC ASSISTED HIATAL HERNIA REPAIR - 120   Anesthesia type: General   Pre-op diagnosis: CAMERON ULCERS IN HIATAL HERNIA   Location[facility] ROOM 02 / [facility]   Surgeons: Johnathan Hausen, MD       DISCUSSION:64 y.o. never smoker with h/o GERD, PONV, HTN, atrial fibrillation, pacemaker-Biotronik implanted for dysautonomia and bradycardia (device orders in 11/17/20 progress note), POTS, stroke, h/o metastatic melanoma, cameron ulcers in hiatal hernia scheduled for above procedure 11/29/2020 with Dr. Johnathan Hausen.   Last seen by cardiology 05/31/2020.   Pt asked to discuss upcoming anesthesia due to previous complications.  She reports after a previous surgery she experienced shaking while waking up.    S/p lap cholecystectomy 03/28/2018 with no anesthesia complications noted. S/p endoscopy 10/20/2020, no anesthesia complications noted.  VS: BP (!) 155/88   Pulse 60   Temp 36.5 C (Oral)   Ht 5\' 3"  (1.6 m)   Wt 65.3 kg   SpO2 99%   BMI 25.51 kg/m   PROVIDERS: Panosh, Standley Brooking, MD is PCP   Coralyn Pear, MD is Cardiologist  LABS: Labs reviewed: Acceptable for surgery. (all labs ordered are listed, but only abnormal results are displayed)  Labs Reviewed  CBC  BASIC METABOLIC PANEL  HEMOGLOBIN A1C  GLUCOSE, CAPILLARY     IMAGES:   EKG: 04/12/2020 Rate 66 bpm  Atrial pacing  CV: Echo 05/31/2020  1. Left ventricular ejection fraction, by estimation, is 60 to 65%. The  left ventricle has normal function. The left ventricle has no regional  wall motion abnormalities. Left ventricular diastolic parameters are  consistent with Grade I diastolic  dysfunction (impaired relaxation).   2. Right ventricular systolic function is normal. The right ventricular  size is normal.   3. The mitral valve is normal in structure. No evidence of mitral valve  regurgitation. No evidence of mitral stenosis.   4. The  aortic valve is normal in structure. Aortic valve regurgitation is  not visualized. No aortic stenosis is present.   5. The inferior vena cava is normal in size with greater than 50%  respiratory variability, suggesting right atrial pressure of 3 mmHg. Past Medical History:  Diagnosis Date   Anemia    Angio-edema    Arthritis    Asthma    Atrial fibrillation ([place])    Atrial tachycardia ([place])    Cervicalgia    Complication of anesthesia    pt states wakes up with "shakes"   CVA (cerebral infarction)    2012 with dizziness and vision change felt embolic from atrial tachy   Disorder of bone and cartilage, unspecified    Disturbance of skin sensation    Diverticulosis    DM (diabetes mellitus) ([place])    DVT (deep venous thrombosis) (H[facility])    DVT (deep venous thrombosis) (H[facility])    Dyspnea    Eczema    Family history of anesthesia complication    PONV   Fatty liver    GERD (gastroesophageal reflux disease)    History of hiatal hernia    History of pacemaker    History of radiation therapy 10/24/2012   brain   HLD (hyperlipidemia)    HTN (hypertension)    Hypoglycemia, unspecified    Injury to unspecified nerve of shoulder girdle and upper limb    Liver metastasis ([place])    Lung metastasis ([place])    Metastasis to brain ([place])  Osteopenia    Other nonspecific abnormal serum enzyme levels    Other specified congenital anomalies of nervous system    Pacemaker    autonomic dysfunction   Pancreatitis    from therapy   Peripheral vascular disease ([facility])    Pneumonia    PONV (postoperative nausea and vomiting)    POTS (postural orthostatic tachycardia syndrome)    Recurrent upper respiratory infection (URI)    Skin cancer    Hx: of lung lesion   Stroke ([place])    left weaker    Swelling of limb    Urticaria     Past Surgical History:  Procedure Laterality Date   ABLATION SAPHENOUS VEIN W/ RFA     2002   ADENOIDECTOMY     CARPAL TUNNEL RELEASE Left    CHOLECYSTECTOMY   03/28/2018   CHOLECYSTECTOMY N/A 03/28/2018   Procedure: LAPAROSCOPIC CHOLECYSTECTOMY WITH INTRAOPERATIVE CHOLANGIOGRAM;  Surgeon: Johnathan Hausen, MD;  Location: [facility];  Service: General;  Laterality: N/A;   CRANIOTOMY N/A 09/30/2012   suboccipital craniectomy   CRANIOTOMY Left 02/09/2013   Procedure: LEFT PARIETAL CRANIOTOMY with stealth;  Surgeon: [name], MD;  Location: [facility];  Service: Neurosurgery;  Laterality: Left;  LEFT Parietal Craniotomy for tumor with stealth   DEEP AXILLARY SENTINEL NODE BIOPSY / EXCISION     due to extensive Melanoma-right arm   fatty tumor removed     from chest   INSERT / REPLACE / REMOVE PACEMAKER  2012   1999, x 3   LAPAROSCOPY  09/06/2011   Procedure: LAPAROSCOPY OPERATIVE;  Surgeon: Claiborne Billings A. Pamala Hurry, MD;  Location: [facility];  Service: Gynecology;  Laterality: Left;  with Left Ovarian Cystectomy    MELANOMA EXCISION     with removal of lymph nodes, left shoulder   NOSE SURGERY  2010, 2012   for nose bleeds x 2   PACEMAKER IMPLANT     SINOSCOPY     TONSILLECTOMY AND ADENOIDECTOMY      MEDICATIONS:  acetaminophen (TYLENOL) 500 MG tablet   albuterol (PROVENTIL) (2.5 MG/3ML) 0.083% nebulizer solution   albuterol (VENTOLIN HFA) 108 (90 Base) MCG/ACT inhaler   augmented betamethasone dipropionate (DIPROLENE) 0.05 % ointment   calcium carbonate (TUMS - DOSED IN MG ELEMENTAL CALCIUM) 500 MG chewable tablet   CALCIUM PO   cholestyramine (QUESTRAN) 4 g packet   clobetasol cream (TEMOVATE) 0.05 %   clonazePAM (KLONOPIN) 1 MG tablet   cloNIDine (CATAPRES) 0.1 MG tablet   cyanocobalamin (,VITAMIN B-12,) 1000 MCG/ML injection   Desoximetasone (TOPICORT) 0.25 % ointment   diphenhydrAMINE (BENADRYL) 25 MG tablet   EPINEPHrine 0.3 mg/0.3 mL IJ SOAJ injection   fluorometholone (FML) 0.1 % ophthalmic suspension   fluorouracil (EFUDEX) 5 % cream   fluticasone (FLOVENT HFA) 44 MCG/ACT inhaler   hydrocortisone (ANUSOL-HC) 25 MG suppository    hydrocortisone 2.5 % cream   hyoscyamine (LEVSIN SL) 0.125 MG SL tablet   levETIRAcetam (KEPPRA) 250 MG tablet   levocetirizine (XYZAL) 5 MG tablet   lipase/protease/amylase (CREON) 12000 units CPEP capsule   metoprolol tartrate (LOPRESSOR) 25 MG tablet   montelukast (SINGULAIR) 10 MG tablet   nystatin (MYCOSTATIN) 100000 UNIT/ML suspension   Olopatadine HCl (PATADAY) 0.2 % SOLN   polyethylene glycol (MIRALAX / GLYCOLAX) packet   potassium chloride SA (K-DUR,KLOR-CON) 20 MEQ tablet   prochlorperazine (COMPAZINE) 10 MG tablet   sodium chloride (OCEAN) 0.65 % SOLN nasal spray   Spacer/Aero-Holding Chambers (OPTICHAMBER DIAMOND) MISC   Syringe/Needle, Disp, (SYRINGE  3CC/27GX1-1/4") 27G X 1-1/4" 3 ML MISC   Vitamin D, Ergocalciferol, (DRISDOL) 50000 units CAPS capsule   No current facility-administered medications for this encounter.   Konrad Felix Ward, PA-C [facility] Pre-Surgical Testing [phone]

## 2020-11-25 ENCOUNTER — Other Ambulatory Visit: Payer: Self-pay | Admitting: Surgery

## 2020-11-28 ENCOUNTER — Other Ambulatory Visit: Payer: Self-pay | Admitting: Surgery

## 2020-11-28 ENCOUNTER — Other Ambulatory Visit: Payer: Self-pay

## 2020-11-28 DIAGNOSIS — E860 Dehydration: Secondary | ICD-10-CM

## 2020-11-28 MED ORDER — DEXTROSE IN LACTATED RINGERS 5 % IV SOLN: INTRAVENOUS | Status: DC

## 2020-11-28 MED ORDER — SODIUM CHLORIDE 0.9% FLUSH: 3.0000 mL | INTRAVENOUS | Status: DC | PRN

## 2020-11-28 MED ORDER — SODIUM CHLORIDE 0.9 % IV SOLN: 250.0000 mL | INTRAVENOUS | Status: DC | PRN

## 2020-11-28 MED ORDER — SODIUM CHLORIDE 0.9% FLUSH: 3.0000 mL | Freq: Two times a day (BID) | INTRAVENOUS | Status: DC

## 2020-11-28 NOTE — Progress Notes (Signed)
IV fluids for dehydration related to diarrhea

## 2020-11-29 ENCOUNTER — Ambulatory Visit (HOSPITAL_COMMUNITY): Admission: RE | Admit: 2020-11-29 | Payer: Medicare Other | Source: Home / Self Care | Admitting: Surgery

## 2020-11-29 ENCOUNTER — Encounter (HOSPITAL_COMMUNITY): Admission: RE | Payer: Self-pay | Source: Home / Self Care

## 2020-11-29 ENCOUNTER — Other Ambulatory Visit: Payer: Self-pay

## 2020-11-29 ENCOUNTER — Ambulatory Visit (INDEPENDENT_AMBULATORY_CARE_PROVIDER_SITE_OTHER): Payer: Medicare Other

## 2020-11-29 DIAGNOSIS — E860 Dehydration: Secondary | ICD-10-CM

## 2020-11-29 LAB — SARS CORONAVIRUS 2 (TAT 6-24 HRS): SARS Coronavirus 2: NEGATIVE

## 2020-11-29 SURGERY — REPAIR, HERNIA, HIATAL, ROBOT-ASSISTED
Anesthesia: General

## 2020-11-29 MED ORDER — DEXTROSE IN LACTATED RINGERS 5 % IV SOLN
INTRAVENOUS | Status: AC
  Filled 2020-11-29 (×2): qty 1000

## 2020-11-29 NOTE — Progress Notes (Signed)
Diagnosis: Dehydration  Provider:  Marshell Garfinkel, MD  Procedure: Infusion  IV Type: Peripheral, IV Location: L Forearm  5%Dextrose in Lactated Ringer, Dose: 2 liters  Infusion Start Time: 09.01 11/29/2020  Infusion Stop Time: 13.27 11/29/2020  Post Infusion IV Care: Peripheral IV Discontinued  Discharge: Condition: Good, Destination: Home . AVS provided to patient.   Performed by:  Arnoldo Morale, RN

## 2020-12-09 NOTE — Progress Notes (Addendum)
COVID swab appointment: 12-23-20  COVID Vaccine Completed:Yes x1 Date COVID Vaccine completed: Has received booster: COVID vaccine manufacturer: [employer]      Date of COVID positive in last 90 days:  No  PCP - [name], MD Cardiologist - Virl Axe, MD  Chest x-ray -  CT chest 07-14-20 [facility] EKG - 04-12-20 Epic Stress Test - greater than 2 years ECHO - 05-31-20 Epic Cardiac Cath - N/A Pacemaker/ICD device last checked: 04-12-20 Epic Spinal Cord Stimulator:  Sleep Study - N/A CPAP -   Fasting Blood Sugar - 89 to 90 Checks Blood Sugar  - 3  times a week   Blood Thinner Instructions: N/A Aspirin Instructions: Last Dose:  Activity level:  Can go up a flight of stairs and perform activities of daily living without stopping and without symptoms of chest pain or shortness of breath.     Anesthesia review:  Pacemaker, Carotid sinus syndrome, POTS, Afib, HTN, CVA, hx of syncope, DM  Last seizure 2014   Patient denies shortness of breath, fever, cough and chest pain at PAT appointment   Patient verbalized understanding of instructions that were given to them at the PAT appointment. Patient was also instructed that they will need to review over the PAT instructions again at home before surgery.

## 2020-12-09 NOTE — Progress Notes (Signed)
Sent message, via epic in basket, requesting orders in epic from surgeon.  

## 2020-12-09 NOTE — Patient Instructions (Addendum)
DUE TO COVID-19 ONLY ONE VISITOR IS ALLOWED TO COME WITH YOU AND STAY IN THE WAITING ROOM ONLY DURING PRE OP AND PROCEDURE.   **NO VISITORS ARE ALLOWED IN THE SHORT STAY AREA OR RECOVERY ROOM!!**  IF YOU WILL BE ADMITTED INTO THE HOSPITAL YOU ARE ALLOWED ONLY TWO SUPPORT PEOPLE DURING VISITATION HOURS ONLY (7 AM -8PM)    Up to two visitors ages 30+ are allowed at one time in a patient's room.  The visitors may rotate out with other people throughout the day.  Additionally, up to two children between the ages of 72 and 43 are allowed and do not count toward the number of allowed visitors.  Children within this age range must be accompanied by an adult visitor.  One adult visitor may remain with the patient overnight and must be in the room by 8 PM.  COVID SWAB TESTING MUST BE COMPLETED ON: Friday, 12-23-20  Between the hours of 8 and 3  **MUST PRESENT COMPLETED FORM AT TESTING SITE**    [facility] (backside of the building)  You are not required to quarantine, however you are required to wear a well-fitted mask when you are out and around people not in your household.  Hand Hygiene often Do NOT share personal items Notify your provider if you are in close contact with someone who has COVID or you develop fever 100.4 or greater, new onset of sneezing, cough, sore throat, shortness of breath or body aches.        Your procedure is scheduled on: Tuesday, 12-27-20   Report to [facility] Main  Entrance     Report to admitting at 11:15 AM   Call this number if you have problems the morning of surgery [phone]   Do not eat food :After Midnight.   May have liquids until 10:30 AM day of surgery  CLEAR LIQUID DIET  Foods Allowed                                                                     Foods Excluded  Water, Black Coffee (no milk/no creamer) and tea, regular and decaf                              liquids that you cannot  Plain Jell-O in any  flavor  (No red)                         see through such as: Fruit ices (not with fruit pulp)                                 milk, soups, orange juice  Iced Popsicles (No red)                                    All solid food                             Apple juices Sports  drinks like Gatorade (No red) Lightly seasoned clear broth or consume(fat free) Sugar      Oral Hygiene is also important to reduce your risk of infection.                                    Remember - BRUSH YOUR TEETH THE MORNING OF SURGERY WITH YOUR REGULAR TOOTHPASTE   Do NOT smoke after Midnight   Take these medicines the morning of surgery with A SIP OF WATER: Acetaminophen, Clonazepam, Keppra, Metoprolol, Xyzal, Compazine  DO NOT TAKE ANY ORAL DIABETIC MEDICATIONS DAY OF YOUR SURGERY                    Stop all vitamins and herbal supplements a week before surgery             You may not have any metal on your body including hair pins, jewelry, and body piercing             Do not wear make-up, lotions, powders, perfumes or deodorant  Do not wear nail polish including gel and S&S, artificial/acrylic nails, or any other type of covering on natural nails including finger and toenails. If you have artificial nails, gel coating, etc. that needs to be removed by a nail salon please have this removed prior to surgery or surgery may need to be canceled/ delayed if the surgeon/ anesthesia feels like they are unable to be safely monitored.   Do not shave  48 hours prior to surgery.          Do not bring valuables to the hospital. [place].   Contacts, dentures or bridgework may not be worn into surgery.   Bring small overnight bag day of surgery.  Please read over the following fact sheets you were given: IF YOU HAVE QUESTIONS ABOUT YOUR PRE OP INSTRUCTIONS PLEASE CALL [facility] - Preparing for Surgery Before surgery, you can play an important role.   Because skin is not sterile, your skin needs to be as free of germs as possible.  You can reduce the number of germs on your skin by washing with CHG (chlorahexidine gluconate) soap before surgery.  CHG is an antiseptic cleaner which kills germs and bonds with the skin to continue killing germs even after washing. Please DO NOT use if you have an allergy to CHG or antibacterial soaps.  If your skin becomes reddened/irritated stop using the CHG and inform your nurse when you arrive at Short Stay. Do not shave (including legs and underarms) for at least 48 hours prior to the first CHG shower.  You may shave your face/neck.  Please follow these instructions carefully:  1.  Shower with CHG Soap the night before surgery and the  morning of surgery.  2.  If you choose to wash your hair, wash your hair first as usual with your normal  shampoo.  3.  After you shampoo, rinse your hair and body thoroughly to remove the shampoo.                             4.  Use CHG as you would any other liquid soap.  You can apply chg directly to the skin and wash.  Gently with a scrungie or clean washcloth.  5.  Apply the  CHG Soap to your body ONLY FROM THE NECK DOWN.   Do   not use on face/ open                           Wound or open sores. Avoid contact with eyes, ears mouth and   genitals (private parts).                       Wash face,  Genitals (private parts) with your normal soap.             6.  Wash thoroughly, paying special attention to the area where your    surgery  will be performed.  7.  Thoroughly rinse your body with warm water from the neck down.  8.  DO NOT shower/wash with your normal soap after using and rinsing off the CHG Soap.                9.  Pat yourself dry with a clean towel.            10.  Wear clean pajamas.            11.  Place clean sheets on your bed the night of your first shower and do not  sleep with pets. Day of Surgery : Do not apply any lotions/deodorants the morning of  surgery.  Please wear clean clothes to the hospital/surgery center.  FAILURE TO FOLLOW THESE INSTRUCTIONS MAY RESULT IN THE CANCELLATION OF YOUR SURGERY  PATIENT SIGNATURE_________________________________  NURSE SIGNATURE__________________________________  ________________________________________________________________________

## 2020-12-12 ENCOUNTER — Ambulatory Visit: Payer: Self-pay | Admitting: Surgery

## 2020-12-13 ENCOUNTER — Other Ambulatory Visit: Payer: Self-pay

## 2020-12-13 ENCOUNTER — Encounter (HOSPITAL_COMMUNITY): Payer: Self-pay

## 2020-12-13 ENCOUNTER — Encounter (HOSPITAL_COMMUNITY)
Admission: RE | Admit: 2020-12-13 | Discharge: 2020-12-13 | Disposition: A | Discharge status: Home / Self Care | Payer: Medicare Other | Source: Ambulatory Visit | Attending: Surgery | Admitting: Surgery

## 2020-12-13 DIAGNOSIS — Z85820 Personal history of malignant melanoma of skin: Secondary | ICD-10-CM | POA: Insufficient documentation

## 2020-12-13 DIAGNOSIS — E869 Volume depletion, unspecified: Secondary | ICD-10-CM | POA: Insufficient documentation

## 2020-12-13 DIAGNOSIS — Z01812 Encounter for preprocedural laboratory examination: Secondary | ICD-10-CM | POA: Diagnosis not present

## 2020-12-13 LAB — BASIC METABOLIC PANEL
Anion gap: 10 (ref 5–15)
BUN: 15 mg/dL (ref 8–23)
CO2: 23 mmol/L (ref 22–32)
Calcium: 9.6 mg/dL (ref 8.9–10.3)
Chloride: 107 mmol/L (ref 98–111)
Creatinine, Ser: 0.75 mg/dL (ref 0.44–1.00)
GFR, Estimated: >60 mL/min (ref >60)
Glucose, Bld: 103 mg/dL — ABNORMAL HIGH (ref 70–99)
Potassium: 4.1 mmol/L (ref 3.5–5.1)
Sodium: 140 mmol/L (ref 135–145)

## 2020-12-13 LAB — GLUCOSE, CAPILLARY: Glucose-Capillary: 105 mg/dL — ABNORMAL HIGH (ref 70–99)

## 2020-12-13 LAB — CBC
HCT: 42.1 % (ref 36.0–46.0)
Hemoglobin: 13.9 g/dL (ref 12.0–15.0)
MCH: 29.1 pg (ref 26.0–34.0)
MCHC: 33 g/dL (ref 30.0–36.0)
MCV: 88.3 fL (ref 80.0–100.0)
Platelets: 280 10*3/uL (ref 150–400)
RBC: 4.77 MIL/uL (ref 3.87–5.11)
RDW: 12.6 % (ref 11.5–15.5)
WBC: 6.5 10*3/uL (ref 4.0–10.5)
nRBC: 0 % (ref 0.0–0.2)

## 2020-12-14 ENCOUNTER — Encounter (HOSPITAL_COMMUNITY): Payer: Self-pay | Admitting: Physician Assistant

## 2020-12-14 ENCOUNTER — Encounter: Payer: Self-pay | Admitting: Internal Medicine

## 2020-12-14 NOTE — Progress Notes (Signed)
[facility] DEVICE PROGRAMMING  Patient Information: Name:  NYISHA CLIPPARD  DOB:  Apr 10, 1955  MRN:  [id number]    Planned Procedure: Hiatal hernia repair  Surgeon:  Johnathan Hausen  Date of Procedure:  12-27-2020  Cautery will be used. Yes  Position during surgery:  Supine   Please send documentation back to:  [facility] (Fax # [phone])  Device Information:  Clinic EP Physician:  Virl Axe, MD   Device Type:  Pacemaker Manufacturer and Phone #:  [employer]: [phone] Pacemaker Dependent?:  Yes.   Date of Last Device Check:  04/12/20 Normal Device Function?:  Yes.    Electrophysiologist's Recommendations:  Have magnet available. Provide continuous ECG monitoring when magnet is used or reprogramming is to be performed.  Procedure may interfere with device function.  Magnet should be placed over device during procedure.  Per Device Clinic Standing Orders, Simone Curia, RN  7:00 AM 12/14/2020

## 2020-12-21 ENCOUNTER — Telehealth: Payer: Self-pay | Admitting: Pharmacist

## 2020-12-21 NOTE — Chronic Care Management (AMB) (Signed)
Chronic Care Management Pharmacy Assistant   Name: [name]  MRN: [id number] DOB: Apr 06, 1955  Spoke with [name] at [facility] and she stated that they have no record of patient and they have not received any documents for patient.  Spoke with patient who stated she does not wish to pursue patient assistance. Patient sated she will be having surgery and medication may change. Not interested at this time. I informed patient that if she changes her mind to call and let me know. Patient thanked me for my call.    Medications: Outpatient Encounter Medications as of 12/21/2020  Medication Sig Note   acetaminophen (TYLENOL) 500 MG tablet Take 500 mg by mouth every 6 (six) hours as needed for moderate pain.    albuterol (PROVENTIL) (2.5 MG/3ML) 0.083% nebulizer solution Take 3 mLs (2.5 mg total) by nebulization every 4 (four) hours as needed for wheezing or shortness of breath.    albuterol (VENTOLIN HFA) 108 (90 Base) MCG/ACT inhaler INHALE 2 PUFFS INTO THE LUNGS EVERY 4 (FOUR) HOURS AS NEEDED FOR WHEEZING OR SHORTNESS OF BREATH.    augmented betamethasone dipropionate (DIPROLENE) 0.05 % ointment Apply topically 2 (two) times daily. Limit to no longer than 2 weeks use. Not on face. (Patient not taking: Reported on 12/12/2020)    calcium carbonate (TUMS - DOSED IN MG ELEMENTAL CALCIUM) 500 MG chewable tablet Chew 2 tablets by mouth daily as needed for indigestion or heartburn.    CALCIUM PO Take 1 tablet by mouth daily.    cholestyramine (QUESTRAN) 4 g packet Take 4 g by mouth 2 (two) times daily.    clobetasol cream (TEMOVATE) 0.62 % Apply 1 application topically 2 (two) times daily as needed (rash).    clonazePAM (KLONOPIN) 1 MG tablet Take 1 tablet (1 mg total) by mouth 2 (two) times daily. May take 3 x per day if needed for extra anxiety (Patient taking differently: Take 1 mg by mouth See admin instructions. Take 1mg  twice a day. Take an additional 1mg  if needed for anxiety.)     cloNIDine (CATAPRES) 0.1 MG tablet Take 1 tablet (0.1 mg total) by mouth at bedtime.    cyanocobalamin (,VITAMIN B-12,) 1000 MCG/ML injection Inject 1 mL (1,000 mcg total) into the muscle every 30 (thirty) days.    Desoximetasone (TOPICORT) 0.25 % ointment Apply 1 application topically 2 (two) times daily. (Patient taking differently: Apply 1 application topically 2 (two) times daily as needed (rash).)    diphenhydrAMINE (BENADRYL) 25 MG tablet Take 25 mg by mouth daily as needed for allergies.    EPINEPHrine 0.3 mg/0.3 mL IJ SOAJ injection Inject 0.3 mg into the muscle as needed for anaphylaxis.    fluorouracil (EFUDEX) 5 % cream Apply 1 application topically 2 (two) times daily as needed (rash).    fluticasone (FLOVENT HFA) 44 MCG/ACT inhaler Inhale 2 puffs into the lungs 2 (two) times daily as needed (shortness of breath).    hyoscyamine (LEVSIN SL) 0.125 MG SL tablet Place 0.125 mg under the tongue every 6 (six) hours as needed for cramping.    levETIRAcetam (KEPPRA) 250 MG tablet Take 250 mg by mouth See admin instructions. Take 250mg  three times a day at 6am, 12pm, 6pm. 11/14/2020: Important that she takes at 6am, 12pm, and 6pm   levocetirizine (XYZAL) 5 MG tablet TAKE 1 TABLET BY MOUTH EVERY DAY AS NEEDED (Patient taking differently: Take 5 mg by mouth daily as needed for allergies.)    lipase/protease/amylase (CREON) 12000  units CPEP capsule Take 12,000 Units by mouth daily with breakfast.    metoprolol tartrate (LOPRESSOR) 25 MG tablet Take 1 tablet (25 mg total) by mouth 2 (two) times daily.    nystatin (MYCOSTATIN) 100000 UNIT/ML suspension Use as directed 5 mLs (500,000 Units total) in the mouth or throat 3 (three) times daily as needed (thrush).    Olopatadine HCl (PATADAY) 0.2 % SOLN Use one drop in each eye once daily as needed. (Patient taking differently: Place 1 drop into both eyes daily as needed (itchy eyes).)    polyethylene glycol (MIRALAX / GLYCOLAX) packet Take 17 g by mouth at  bedtime.    potassium chloride SA (K-DUR,KLOR-CON) 20 MEQ tablet Take 1 tablet (20 mEq total) by mouth 2 (two) times daily.    prochlorperazine (COMPAZINE) 10 MG tablet Take 1 tablet (10 mg total) by mouth every 6 (six) hours as needed for nausea or vomiting.    sodium chloride (OCEAN) 0.65 % SOLN nasal spray Place 1 spray into both nostrils as needed for congestion.     Spacer/Aero-Holding Chambers ([place]) MISC See admin instructions.    Syringe/Needle, Disp, (SYRINGE 3CC/27GX1-1/4") 27G X 1-1/4" 3 ML MISC Inject 1 mL into the muscle every 30 (thirty) days. Vit B12    Vitamin D, Ergocalciferol, (DRISDOL) 50000 units CAPS capsule TAKE 1 CAPSULE (50,000 UNITS TOTAL) BY MOUTH EVERY 7 (SEVEN) DAYS.    No facility-administered encounter medications on file as of 12/21/2020.    Care Gaps:  AWV - scheduled for 03/28/21 HIV screening - never done Hepatitis C screening - never done Tetanus/TDAP - never done Zoster vaccines - never done Pap smear - never done  Pneumonia vaccine - overdue since 2015 Flu vaccine - due now  Star Rating Drugs:  None.  [place]  Clinical Pharmacist Assistant [phone]

## 2020-12-26 ENCOUNTER — Telehealth: Payer: Self-pay | Admitting: Internal Medicine

## 2020-12-26 ENCOUNTER — Encounter: Payer: Self-pay | Admitting: Internal Medicine

## 2020-12-26 ENCOUNTER — Other Ambulatory Visit: Payer: Self-pay

## 2020-12-26 ENCOUNTER — Ambulatory Visit (INDEPENDENT_AMBULATORY_CARE_PROVIDER_SITE_OTHER): Payer: Medicare Other | Admitting: Internal Medicine

## 2020-12-26 VITALS — BP 130/80 | HR 67 | Temp 97.8°F | Ht 63.0 in | Wt 144.8 lb

## 2020-12-26 DIAGNOSIS — Z789 Other specified health status: Secondary | ICD-10-CM | POA: Diagnosis not present

## 2020-12-26 DIAGNOSIS — R21 Rash and other nonspecific skin eruption: Secondary | ICD-10-CM

## 2020-12-26 DIAGNOSIS — M7989 Other specified soft tissue disorders: Secondary | ICD-10-CM | POA: Diagnosis not present

## 2020-12-26 DIAGNOSIS — C439 Malignant melanoma of skin, unspecified: Secondary | ICD-10-CM | POA: Diagnosis not present

## 2020-12-26 NOTE — Progress Notes (Signed)
Chief Complaint  Patient presents with   Rash    Patient complains of rash, x1 week, Tried Benadryl, Hydrocortisone creams,     HPI: [name] 65 y.o. come in with spouse as an add-on appointment for rash she developed 3 days ago that was alarming to her without history of same.  See on-call messages. Began right hand forearm around both ankles feeling like her skin was swelling and severe itching some around her neck but no choking shortness of breath with it.  No associated fever or exposures that she is aware of. Her right middle finger became swollen for no reason and then the skin began to crack. She has no history of rheumatologic disease diagnosed but there is some suspicion that she could have Raynaud's phenomenon. She is at risk for autoimmune problems based on her treatment for metastatic melanoma.  She is known to have some allergies to beef and pork which she avoids has not been to the allergist recently has an EpiPen if needed She took oral Benadryl which made her very drowsy but significantly helped the swelling and the itching.  She has residual roughness rash skin right groin bilateral upper thigh right hand and neck is faded  No new medicines illnesses or associated symptoms otherwise.  She does have an appointment with her dermatologist Dr. Wilhemina Bonito next week in regard to follow-up for the melanoma. Masked masked  ROS: See pertinent positives and negatives per HPI.  Past Medical History:  Diagnosis Date   Anemia    Angio-edema    Arthritis    Asthma    Atrial fibrillation (HCC)    Atrial tachycardia ([place])    Cervicalgia    Complication of anesthesia    pt states wakes up with "shakes"   CVA (cerebral infarction)    2012 with dizziness and vision change felt embolic from atrial tachy   Disorder of bone and cartilage, unspecified    Disturbance of skin sensation    Diverticulosis    DM (diabetes mellitus) ([place])    DVT (deep venous thrombosis) (HCC)     DVT (deep venous thrombosis) (HCC)    Dyspnea    Eczema    Family history of anesthesia complication    PONV   Fatty liver    GERD (gastroesophageal reflux disease)    History of hiatal hernia    History of pacemaker    History of radiation therapy 10/24/2012   brain   HLD (hyperlipidemia)    HTN (hypertension)    Hypoglycemia, unspecified    Injury to unspecified nerve of shoulder girdle and upper limb    Liver metastasis (HCC)    Lung metastasis (HCC)    Metastasis to brain (HCC)    Osteopenia    Other nonspecific abnormal serum enzyme levels    Other specified congenital anomalies of nervous system    Pacemaker    autonomic dysfunction   Pancreatitis    from therapy   Peripheral vascular disease (HCC)    Pneumonia    PONV (postoperative nausea and vomiting)    POTS (postural orthostatic tachycardia syndrome)    Recurrent upper respiratory infection (URI)    Skin cancer    Hx: of lung lesion   Stroke ([place])    left weaker    Swelling of limb    Urticaria     Family History  Problem Relation Age of Onset   Allergic rhinitis Sister    Stroke Mother    Colon polyps  Mother    Colon cancer Mother    Urticaria Mother    Allergic rhinitis Mother    Heart disease Father    Diabetes Father    Kidney disease Father    Diabetes Maternal Grandmother    Clotting disorder Maternal Grandmother        stroke   Crohn's disease Maternal Grandmother    Diabetes Paternal Grandmother    Aneurysm Sister        brain    Social History   Socioeconomic History   Marital status: Married    Spouse name: Not on file   Number of children: 0   Years of education: Not on file   Highest education level: Not on file  Occupational History   Occupation: [place] [place]    Employer: [employer]    Comment: self employed  Tobacco Use   Smoking status: Never   Smokeless tobacco: Never  Vaping Use   Vaping Use: Never used  Substance and Sexual Activity   Alcohol use: Not  Currently    Comment: glass of wine daily   Drug use: No   Sexual activity: Not on file  Other Topics Concern   Not on file  Social History Narrative   4 years college hh of 2    Neg tad   Social Determinants of Health   Financial Resource Strain: Medium Risk   Difficulty of Paying Living Expenses: Somewhat hard  Food Insecurity: No Food Insecurity   Worried About Charity fundraiser in the Last Year: Never true   [place] in the Last Year: Never true  Transportation Needs: No Transportation Needs   Lack of Transportation (Medical): No   Lack of Transportation (Non-Medical): No  Physical Activity: Sufficiently Active   Days of Exercise per Week: 5 days   Minutes of Exercise per Session: 60 min  Stress: Stress Concern Present   Feeling of Stress : To some extent  Social Connections: Engineer, building services of Communication with Friends and Family: More than three times a week   Frequency of Social Gatherings with Friends and Family: Patient refused   Attends Religious Services: More than 4 times per year   Active Member of [employer] or Organizations: Yes   Attends Music therapist: More than 4 times per year   Marital Status: Married    Outpatient Medications Prior to Visit  Medication Sig Dispense Refill   acetaminophen (TYLENOL) 500 MG tablet Take 500 mg by mouth every 6 (six) hours as needed for moderate pain.     albuterol (PROVENTIL) (2.5 MG/3ML) 0.083% nebulizer solution Take 3 mLs (2.5 mg total) by nebulization every 4 (four) hours as needed for wheezing or shortness of breath. 180 mL 1   albuterol (VENTOLIN HFA) 108 (90 Base) MCG/ACT inhaler INHALE 2 PUFFS INTO THE LUNGS EVERY 4 (FOUR) HOURS AS NEEDED FOR WHEEZING OR SHORTNESS OF BREATH. 18 Inhaler 0   augmented betamethasone dipropionate (DIPROLENE) 0.05 % ointment Apply topically 2 (two) times daily. Limit to no longer than 2 weeks use. Not on face. 50 g 0   calcium carbonate (TUMS - DOSED IN  MG ELEMENTAL CALCIUM) 500 MG chewable tablet Chew 2 tablets by mouth daily as needed for indigestion or heartburn.     CALCIUM PO Take 1 tablet by mouth daily.     cholestyramine (QUESTRAN) 4 g packet Take 4 g by mouth 2 (two) times daily.     clobetasol cream (TEMOVATE)  7.12 % Apply 1 application topically 2 (two) times daily as needed (rash).     clonazePAM (KLONOPIN) 1 MG tablet Take 1 tablet (1 mg total) by mouth 2 (two) times daily. May take 3 x per day if needed for extra anxiety (Patient taking differently: Take 1 mg by mouth See admin instructions. Take 1mg  twice a day. Take an additional 1mg  if needed for anxiety.) 70 tablet 1   cloNIDine (CATAPRES) 0.1 MG tablet Take 1 tablet (0.1 mg total) by mouth at bedtime. 90 tablet 3   cyanocobalamin (,VITAMIN B-12,) 1000 MCG/ML injection Inject 1 mL (1,000 mcg total) into the muscle every 30 (thirty) days. 1 mL 12   Desoximetasone (TOPICORT) 0.25 % ointment Apply 1 application topically 2 (two) times daily. (Patient taking differently: Apply 1 application topically 2 (two) times daily as needed (rash).) 30 g 1   diphenhydrAMINE (BENADRYL) 25 MG tablet Take 25 mg by mouth daily as needed for allergies.     EPINEPHrine 0.3 mg/0.3 mL IJ SOAJ injection Inject 0.3 mg into the muscle as needed for anaphylaxis.     fluorouracil (EFUDEX) 5 % cream Apply 1 application topically 2 (two) times daily as needed (rash).     fluticasone (FLOVENT HFA) 44 MCG/ACT inhaler Inhale 2 puffs into the lungs 2 (two) times daily as needed (shortness of breath).     hyoscyamine (LEVSIN SL) 0.125 MG SL tablet Place 0.125 mg under the tongue every 6 (six) hours as needed for cramping.     levETIRAcetam (KEPPRA) 250 MG tablet Take 250 mg by mouth See admin instructions. Take 250mg  three times a day at 6am, 12pm, 6pm.     levocetirizine (XYZAL) 5 MG tablet TAKE 1 TABLET BY MOUTH EVERY DAY AS NEEDED (Patient taking differently: Take 5 mg by mouth daily as needed for allergies.) 30  tablet 0   lipase/protease/amylase (CREON) 12000 units CPEP capsule Take 12,000 Units by mouth daily with breakfast.     metoprolol tartrate (LOPRESSOR) 25 MG tablet Take 1 tablet (25 mg total) by mouth 2 (two) times daily. 180 tablet 3   nystatin (MYCOSTATIN) 100000 UNIT/ML suspension Use as directed 5 mLs (500,000 Units total) in the mouth or throat 3 (three) times daily as needed (thrush). 60 mL 2   Olopatadine HCl (PATADAY) 0.2 % SOLN Use one drop in each eye once daily as needed. (Patient taking differently: Place 1 drop into both eyes daily as needed (itchy eyes).) 2.5 mL 5   polyethylene glycol (MIRALAX / GLYCOLAX) packet Take 17 g by mouth at bedtime.     potassium chloride SA (K-DUR,KLOR-CON) 20 MEQ tablet Take 1 tablet (20 mEq total) by mouth 2 (two) times daily. 60 tablet 3   prochlorperazine (COMPAZINE) 10 MG tablet Take 1 tablet (10 mg total) by mouth every 6 (six) hours as needed for nausea or vomiting. 60 tablet 0   sodium chloride (OCEAN) 0.65 % SOLN nasal spray Place 1 spray into both nostrils as needed for congestion.      Spacer/Aero-Holding Chambers ([place]) MISC See admin instructions.     Syringe/Needle, Disp, (SYRINGE 3CC/27GX1-1/4") 27G X 1-1/4" 3 ML MISC Inject 1 mL into the muscle every 30 (thirty) days. Vit B12 12 each 0   Vitamin D, Ergocalciferol, (DRISDOL) 50000 units CAPS capsule TAKE 1 CAPSULE (50,000 UNITS TOTAL) BY MOUTH EVERY 7 (SEVEN) DAYS. 4 capsule 11   No facility-administered medications prior to visit.     EXAM:  BP 130/80 (BP Location: Left Arm,  Patient Position: Sitting, Cuff Size: Normal)   Pulse 67   Temp 97.8 F (36.6 C) (Oral)   Ht 5\' 3"  (1.6 m)   Wt 144 lb 12.8 oz (65.7 kg)   SpO2 98%   BMI 25.65 kg/m   Body mass index is 25.65 kg/m.  GENERAL: vitals reviewed and listed above, alert, oriented, appears well hydrated and in no acute distress HEENT: atraumatic, conjunctiva  clear, no obvious abnormalities on inspection of  external nose and ears OP : Masked no respiratory distress NECK: no obvious masses on inspection palpation  Skin sun changes around neck some rough area but no hives or vesicles Right hand distal forearm and dorsal area of hand a palpable scaly patchy type rash without vesicle.  Faded ring rash around the top of the ankle. Patient has pictures of rash at its maximum. MS: moves all extremities without noticeable focal  abnormality right middle finger okay range of motion slightly puffy. PSYCH: pleasant and cooperative, no obvious depression or anxiety Lab Results  Component Value Date   WBC 6.5 12/13/2020   HGB 13.9 12/13/2020   HCT 42.1 12/13/2020   PLT 280 12/13/2020   GLUCOSE 103 (H) 12/13/2020   CHOL 145 02/09/2014   TRIG 123.0 02/09/2014   HDL 47.90 02/09/2014   LDLDIRECT 109.7 10/11/2010   LDLCALC 73 02/09/2014   ALT 27 08/09/2020   AST 27 08/09/2020   NA 140 12/13/2020   K 4.1 12/13/2020   CL 107 12/13/2020   CREATININE 0.75 12/13/2020   BUN 15 12/13/2020   CO2 23 12/13/2020   TSH 1.146 12/17/2013   INR 1.03 08/28/2013   HGBA1C 5.6 11/17/2020   BP Readings from Last 3 Encounters:  12/26/20 130/80  12/13/20 125/88  11/29/20 134/86   ASSESSMENT AND PLAN:  Discussed the following assessment and plan:  Rash - see notes immprove on  benadryl came up suddenly  faded but now palpable denies external trigger  at risk   Finger swelling - improved  uncertain if could have had dactylitis  Medically complex patient  Malignant melanoma, metastatic ([place]) in remission Uncertain cause of rash it does seem to be in exposed areas but not typical photodermatitis Responded to oral Benadryl but still has rough patches around.  She has a history of what she calls Raynaud's and that is possible uncertain if underlying specific rheumatologic disease if recurring. She also has a history of certain allergies but this seems atypical. She denies any topical exposures that is unusual.  And  no history of psoriasis -Patient advised to return or notify health care team  if  new concerns arise.  Patient Instructions  Not sure what caused this  rash and swelling.   Consider seeing rheumatology  since you had a finger swelling with this.   Avoid topical benadryl.  But oral  is ok.to take for rx .   Topical  steroid  ok to try .  Would like Dr. Ronnald Ramp  dermatology  to also opine on this   rash   ( Pictures )  ? Any meds .   Send me a message after derm check . To decide on referral .     Standley Brooking. Jaycob Mcclenton M.D.

## 2020-12-26 NOTE — Telephone Encounter (Signed)
Patient called after hours line--states on wrist, legs, inner thighs and groin is red and irritated.  Unsure if it's an allergic reaction.  Pt is currently on Many Medications as a cancer patient.  Pt has taken Benedryl but it does not go away for good.

## 2020-12-26 NOTE — Telephone Encounter (Signed)
Left a message for the pt to return my call.  

## 2020-12-26 NOTE — Patient Instructions (Addendum)
Not sure what caused this  rash and swelling.   Consider seeing rheumatology  since you had a finger swelling with this.   Avoid topical benadryl.  But oral  is ok.to take for rx .   Topical  steroid  ok to try .  Would like Dr. Ronnald Ramp  dermatology  to also opine on this   rash   ( Pictures )  ? Any meds .   Send me a message after derm check . To decide on referral .

## 2020-12-26 NOTE — Telephone Encounter (Signed)
Caller states last 24 hours: right wrist with bubbly rash. Same at top of both of thighs/groin area. Very painful, itchy. Took half benedryl yesterday, helped some but calmed it down some. Has tried benedryl cream too. Tonight increase in redness. Thinks maybe she has cellulitis. Denies new medications. No fever. 12/23/2020 7:18:43 PM See PCP within 24 Hours Yes Gordy Savers, RN, [place] Understands Yes  User: Nicoletta Ba, RN Date/Time (Eastern Time): 12/23/2020 8:22:16 PM  Caller states she will take benedryl again for the night and follow up with possibly one of her other providers. Does not want to go to UC or ED for the weekend. Has taken a picture of the rash. Instructed to call back with worsening of symptoms for tonight/weekend.

## 2020-12-29 NOTE — Telephone Encounter (Signed)
Had  in person visit  Oct 31

## 2021-01-02 DIAGNOSIS — Z85820 Personal history of malignant melanoma of skin: Secondary | ICD-10-CM | POA: Diagnosis not present

## 2021-01-02 DIAGNOSIS — Z85828 Personal history of other malignant neoplasm of skin: Secondary | ICD-10-CM | POA: Diagnosis not present

## 2021-01-02 DIAGNOSIS — L245 Irritant contact dermatitis due to other chemical products: Secondary | ICD-10-CM | POA: Diagnosis not present

## 2021-01-02 DIAGNOSIS — L820 Inflamed seborrheic keratosis: Secondary | ICD-10-CM | POA: Diagnosis not present

## 2021-01-02 DIAGNOSIS — L239 Allergic contact dermatitis, unspecified cause: Secondary | ICD-10-CM | POA: Diagnosis not present

## 2021-01-11 ENCOUNTER — Telehealth: Payer: Self-pay | Admitting: Pharmacist

## 2021-01-11 NOTE — Chronic Care Management (AMB) (Signed)
Chronic Care Management Pharmacy Assistant   Name: [name]  MRN: [id number] DOB: [birth year]  Reason for Encounter: Disease State / Hypertension Assessment Call   Conditions to be addressed/monitored: HTN   Recent office visits:  12/26/2020 Amanda Ace MD - Patient was seen for a rash and additional issues. No medication changes. No follow up noted.  Recent consult visits:  None  Hospital visits:  None  Medications: Outpatient Encounter Medications as of 01/11/2021  Medication Sig Note   acetaminophen (TYLENOL) 500 MG tablet Take 500 mg by mouth every 6 (six) hours as needed for moderate pain.    albuterol (PROVENTIL) (2.5 MG/3ML) 0.083% nebulizer solution Take 3 mLs (2.5 mg total) by nebulization every 4 (four) hours as needed for wheezing or shortness of breath.    albuterol (VENTOLIN HFA) 108 (90 Base) MCG/ACT inhaler INHALE 2 PUFFS INTO THE LUNGS EVERY 4 (FOUR) HOURS AS NEEDED FOR WHEEZING OR SHORTNESS OF BREATH.    augmented betamethasone dipropionate (DIPROLENE) 0.05 % ointment Apply topically 2 (two) times daily. Limit to no longer than 2 weeks use. Not on face.    calcium carbonate (TUMS - DOSED IN MG ELEMENTAL CALCIUM) 500 MG chewable tablet Chew 2 tablets by mouth daily as needed for indigestion or heartburn.    CALCIUM PO Take 1 tablet by mouth daily.    cholestyramine (QUESTRAN) 4 g packet Take 4 g by mouth 2 (two) times daily.    clobetasol cream (TEMOVATE) 0.97 % Apply 1 application topically 2 (two) times daily as needed (rash).    clonazePAM (KLONOPIN) 1 MG tablet Take 1 tablet (1 mg total) by mouth 2 (two) times daily. May take 3 x per day if needed for extra anxiety (Patient taking differently: Take 1 mg by mouth See admin instructions. Take 1mg  twice a day. Take an additional 1mg  if needed for anxiety.)    cloNIDine (CATAPRES) 0.1 MG tablet Take 1 tablet (0.1 mg total) by mouth at bedtime.    cyanocobalamin (,VITAMIN B-12,) 1000 MCG/ML injection Inject  1 mL (1,000 mcg total) into the muscle every 30 (thirty) days.    Desoximetasone (TOPICORT) 0.25 % ointment Apply 1 application topically 2 (two) times daily. (Patient taking differently: Apply 1 application topically 2 (two) times daily as needed (rash).)    diphenhydrAMINE (BENADRYL) 25 MG tablet Take 25 mg by mouth daily as needed for allergies.    EPINEPHrine 0.3 mg/0.3 mL IJ SOAJ injection Inject 0.3 mg into the muscle as needed for anaphylaxis.    fluorouracil (EFUDEX) 5 % cream Apply 1 application topically 2 (two) times daily as needed (rash).    fluticasone (FLOVENT HFA) 44 MCG/ACT inhaler Inhale 2 puffs into the lungs 2 (two) times daily as needed (shortness of breath).    hyoscyamine (LEVSIN SL) 0.125 MG SL tablet Place 0.125 mg under the tongue every 6 (six) hours as needed for cramping.    levETIRAcetam (KEPPRA) 250 MG tablet Take 250 mg by mouth See admin instructions. Take 250mg  three times a day at 6am, 12pm, 6pm. 11/14/2020: Important that she takes at 6am, 12pm, and 6pm   levocetirizine (XYZAL) 5 MG tablet TAKE 1 TABLET BY MOUTH EVERY DAY AS NEEDED (Patient taking differently: Take 5 mg by mouth daily as needed for allergies.)    lipase/protease/amylase (CREON) 12000 units CPEP capsule Take 12,000 Units by mouth daily with breakfast.    metoprolol tartrate (LOPRESSOR) 25 MG tablet Take 1 tablet (25 mg total) by mouth 2 (two) times  daily.    nystatin (MYCOSTATIN) 100000 UNIT/ML suspension Use as directed 5 mLs (500,000 Units total) in the mouth or throat 3 (three) times daily as needed (thrush).    Olopatadine HCl (PATADAY) 0.2 % SOLN Use one drop in each eye once daily as needed. (Patient taking differently: Place 1 drop into both eyes daily as needed (itchy eyes).)    polyethylene glycol (MIRALAX / GLYCOLAX) packet Take 17 g by mouth at bedtime.    potassium chloride SA (K-DUR,KLOR-CON) 20 MEQ tablet Take 1 tablet (20 mEq total) by mouth 2 (two) times daily.    prochlorperazine  (COMPAZINE) 10 MG tablet Take 1 tablet (10 mg total) by mouth every 6 (six) hours as needed for nausea or vomiting.    sodium chloride (OCEAN) 0.65 % SOLN nasal spray Place 1 spray into both nostrils as needed for congestion.     Spacer/Aero-Holding Chambers ([place]) MISC See admin instructions.    Syringe/Needle, Disp, (SYRINGE 3CC/27GX1-1/4") 27G X 1-1/4" 3 ML MISC Inject 1 mL into the muscle every 30 (thirty) days. Vit B12    Vitamin D, Ergocalciferol, (DRISDOL) 50000 units CAPS capsule TAKE 1 CAPSULE (50,000 UNITS TOTAL) BY MOUTH EVERY 7 (SEVEN) DAYS.    No facility-administered encounter medications on file as of 01/11/2021.   Fill History: BIMATOPROST 0.03% EYELASH SOLN 12/23/2020 30   CHOLESTYRAMINE PACKET 12/19/2020 30   CYANOCOBALAMIN 1,000 MCG/ML VL 10/19/2020 90   METOPROLOL TARTRATE 25 MG TAB 12/05/2020 90   CLONIDINE HCL 0.1 MG TABLET 11/07/2020 90   CLONAZEPAM 1 MG TABLET 12/05/2020 30   LEVETIRACETAM 250 MG TABLET 10/11/2020 90  Reviewed chart prior to disease state call. Spoke with patient regarding BP  Recent Office Vitals: BP Readings from Last 3 Encounters:  12/26/20 130/80  12/13/20 125/88  11/29/20 134/86   Pulse Readings from Last 3 Encounters:  12/26/20 67  12/13/20 69  11/29/20 61    Wt Readings from Last 3 Encounters:  12/26/20 144 lb 12.8 oz (65.7 kg)  12/13/20 144 lb 3.2 oz (65.4 kg)  11/29/20 146 lb 3.2 oz (66.3 kg)     Kidney Function Lab Results  Component Value Date/Time   CREATININE 0.75 12/13/2020 02:05 PM   CREATININE 0.63 11/17/2020 01:51 PM   CREATININE 0.9 05/27/2015 09:24 AM   CREATININE 0.9 02/23/2015 09:05 AM   GFR 85.78 05/24/2011 11:08 AM   GFRNONAA >60 12/13/2020 02:05 PM   GFRAA >60 05/19/2019 11:47 AM    BMP Latest Ref Rng & Units 12/13/2020 11/17/2020 05/19/2019  Glucose 70 - 99 mg/dL 103(H) 99 -  BUN 8 - 23 mg/dL 15 13 -  Creatinine 0.44 - 1.00 mg/dL 0.75 0.63 1.10(H)  Sodium 135 - 145 mmol/L 140 140 -   Potassium 3.5 - 5.1 mmol/L 4.1 4.0 -  Chloride 98 - 111 mmol/L 107 105 -  CO2 22 - 32 mmol/L 23 27 -  Calcium 8.9 - 10.3 mg/dL 9.6 9.5 -    Current antihypertensive regimen:  Metoprolol 25 mg twice daily  How often are you checking your Blood Pressure? 3-5x per week  Current home BP readings: Patients BP yesterday was 132/86  What recent interventions/DTPs have been made by any provider to improve Blood Pressure control since last CPP Visit: None  Any recent hospitalizations or ED visits since last visit with CPP? No  What diet changes have been made to improve Blood Pressure Control?  Patients diet has been limited due to GI issues which she plans to discuss  with her oncologist in [facility] Currently for breakfast patient will have 1/2 bowl of oatmeal or a muffin and for lunch/dinner she will have [place] with a little mayo and maybe a baked potato.  What exercise is being done to improve your Blood Pressure Control?  Patient walks daily about 2000 steps  Adherence Review: Is the patient currently on Barrera/ARB medication? No Does the patient have >5 day gap between last estimated fill dates? No  Care Gaps: AWV - scheduled for 03/28/21 Last BP - 130/80 on 12/26/2020 HIV screening - never done Hepatitis C screening - never done Tetanus/TDAP - never done Zoster vaccines - never done Pap smear - never done Pneumonia vaccine - overdue  Flu vaccine - due now  Star Rating Drugs: None  [place] Pharmacist Assistant [phone]

## 2021-01-12 ENCOUNTER — Other Ambulatory Visit: Payer: Self-pay | Admitting: Internal Medicine

## 2021-01-12 DIAGNOSIS — Z9289 Personal history of other medical treatment: Secondary | ICD-10-CM | POA: Diagnosis not present

## 2021-01-12 DIAGNOSIS — C439 Malignant melanoma of skin, unspecified: Secondary | ICD-10-CM | POA: Diagnosis not present

## 2021-01-12 DIAGNOSIS — I7300 Raynaud's syndrome without gangrene: Secondary | ICD-10-CM | POA: Diagnosis not present

## 2021-01-12 DIAGNOSIS — Z79899 Other long term (current) drug therapy: Secondary | ICD-10-CM | POA: Diagnosis not present

## 2021-01-12 DIAGNOSIS — R918 Other nonspecific abnormal finding of lung field: Secondary | ICD-10-CM | POA: Diagnosis not present

## 2021-01-12 DIAGNOSIS — C4490 Unspecified malignant neoplasm of skin, unspecified: Secondary | ICD-10-CM | POA: Diagnosis not present

## 2021-01-12 DIAGNOSIS — K449 Diaphragmatic hernia without obstruction or gangrene: Secondary | ICD-10-CM | POA: Diagnosis not present

## 2021-01-12 DIAGNOSIS — K5730 Diverticulosis of large intestine without perforation or abscess without bleeding: Secondary | ICD-10-CM | POA: Diagnosis not present

## 2021-01-12 DIAGNOSIS — Z6826 Body mass index (BMI) 26.0-26.9, adult: Secondary | ICD-10-CM | POA: Diagnosis not present

## 2021-01-12 DIAGNOSIS — C787 Secondary malignant neoplasm of liver and intrahepatic bile duct: Secondary | ICD-10-CM | POA: Diagnosis not present

## 2021-01-12 DIAGNOSIS — K580 Irritable bowel syndrome with diarrhea: Secondary | ICD-10-CM | POA: Diagnosis not present

## 2021-01-17 ENCOUNTER — Telehealth (INDEPENDENT_AMBULATORY_CARE_PROVIDER_SITE_OTHER): Payer: Medicare Other | Admitting: Internal Medicine

## 2021-01-17 ENCOUNTER — Other Ambulatory Visit: Payer: Self-pay

## 2021-01-17 ENCOUNTER — Telehealth: Payer: Self-pay | Admitting: Internal Medicine

## 2021-01-17 ENCOUNTER — Encounter: Payer: Self-pay | Admitting: Internal Medicine

## 2021-01-17 VITALS — Temp 97.6°F | Ht 63.0 in | Wt 144.8 lb

## 2021-01-17 DIAGNOSIS — R300 Dysuria: Secondary | ICD-10-CM | POA: Diagnosis not present

## 2021-01-17 DIAGNOSIS — R3989 Other symptoms and signs involving the genitourinary system: Secondary | ICD-10-CM | POA: Diagnosis not present

## 2021-01-17 LAB — POC URINALSYSI DIPSTICK (AUTOMATED)
Bilirubin, UA: NEGATIVE
Blood, UA: NEGATIVE
Glucose, UA: NEGATIVE
Ketones, UA: NEGATIVE
Nitrite, UA: NEGATIVE
Protein, UA: POSITIVE — AB
Spec Grav, UA: 1.02 (ref 1.010–1.025)
Urobilinogen, UA: 0.2 E.U./dL
pH, UA: 6 (ref 5.0–8.0)

## 2021-01-17 MED ORDER — CEFDINIR 300 MG PO CAPS: 300.0000 mg | ORAL_CAPSULE | Freq: Two times a day (BID) | ORAL | 0 refills | Status: DC

## 2021-01-17 NOTE — Telephone Encounter (Signed)
Patient called because she is suspecting she has a UTI but wants to talk to someone about her symptoms. Patient is scheduled for appointment with Dr.Burchette tomorrow at 11:45. Did refuse telehealth appointment with Dr.Kim at 5:35, today.   Good callback number is [phone]  Please advise

## 2021-01-17 NOTE — Telephone Encounter (Signed)
So what symptoms is she having ? If typical uti sx  we can have her collect a urine and culture [facility]  or here  and can do a video visit at 430 today if she wishes.

## 2021-01-17 NOTE — Progress Notes (Signed)
   Virtual Visit via Telephone Note  I connected with@ on 01/17/21 at  4:30 PM EST by telephone and verified that I am speaking with the correct person using two identifiers.   I discussed the limitations, risks, security and privacy concerns of performing an evaluation and management service by telephone and the limited availability of in person appointments. tThere may be a patient responsible charge related to this service. The patient expressed understanding and agreed to proceed.  [name] phone visit because patient feels that her Internet is unstable.  And visit telephone would be better.  Location patient: home Location provider: work  office Participants present for the call: patient, provider Patient did not have a visit in the prior 7 days to address this/these issue(s).   History of Present Illness: [name] telephone no current Internet to do video visit. Add on sda  for uti sx  Onset symptoms 3 days ago with some dysuria waxes and wanes depending on hydration.  Was unable to do her full 3 mile walk had to cut it to 2 when she had lower abdominal pain over her bladder today she has some upper back pain and continued dysuria frequency without hematuria Her last UTI that she remembers was before her cancer diagnosis 7+ years ago.  She has had no fever but some chills To have GI surgery on December 5    Observations/Objective: Patient sounds personable and well on the phone. I do not appreciate any SOB. Speech and thought processing are grossly intact. Patient reported vitals: Lab Results  Component Value Date   WBC 6.5 12/13/2020   HGB 13.9 12/13/2020   HCT 42.1 12/13/2020   PLT 280 12/13/2020   GLUCOSE 103 (H) 12/13/2020   CHOL 145 02/09/2014   TRIG 123.0 02/09/2014   HDL 47.90 02/09/2014   LDLDIRECT 109.7 10/11/2010   LDLCALC 73 02/09/2014   ALT 27 08/09/2020   AST 27 08/09/2020   NA 140 12/13/2020   K 4.1 12/13/2020   CL 107 12/13/2020   CREATININE 0.75  12/13/2020   BUN 15 12/13/2020   CO2 23 12/13/2020   TSH 1.146 12/17/2013   INR 1.03 08/28/2013   HGBA1C 5.6 11/17/2020    Assessment and Plan:  Dysuria - Plan: POCT Urinalysis Dipstick (Automated), Urine Culture, Urine Culture  Suspected UTI  Urinalysis 3+ leuk. Pos protein  neg heme nitrites   Symptoms consistent with cystitis.  Urine culture ordered  Follow Up Instructions: Begin antibiotic pending urine and sensitivity culture.  Omnicef twice daily for 5 days she states she is able to take Keflex but not penicillin. Follow-up if persistent progressive or alarm findings over the holidays.   [id number] 5-10 [id number] 11-20 [id number] 21-30 I did not refer this patient for an OV in the next 24 hours for this/these issue(s).  I discussed the assessment and treatment plan with the patient. The patient was provided an opportunity to ask questions and answered. The patient agreed with the plan and demonstrated an understanding of the instructions.   The patient was advised to call back or seek an in-person evaluation if the symptoms worsen or if the condition fails to improve as anticipated.  I provided 15 minutes of non-face-to-face time during this encounter. Return if symptoms worsen or fail to improve as expected.  [name], MD

## 2021-01-17 NOTE — Telephone Encounter (Signed)
I spoke with the pt and she reported Dysuria, back pain, urine odor and cloudiness, x2 days. Pt stated she has no fever at this time. Pt stated she would come into the office to provide urine sample. Pt has been added to virtual schedule for today.

## 2021-01-18 ENCOUNTER — Ambulatory Visit: Payer: Medicare Other | Admitting: Family Medicine

## 2021-01-18 ENCOUNTER — Encounter: Payer: Self-pay | Admitting: Internal Medicine

## 2021-01-18 NOTE — Progress Notes (Addendum)
COVID swab appointment: 01/26/21  COVID Vaccine Completed: yes x1 Date COVID Vaccine completed: 05/01/19 Has received booster: COVID vaccine manufacturer: Pfizer       Date of COVID positive in last 90 days:  PCP - [name], MD Cardiologist - Virl Axe, MD  Chest x-ray - CT 01/12/21 care everywhere EKG - 04/12/20 Epic Stress Test - years ago per pt ECHO - 05/31/20 Epic Cardiac Cath - n/a Pacemaker/ICD device last checked: 04/12/20 Epic Spinal Cord Stimulator: n/a  Sleep Study - n/a CPAP -   Fasting Blood Sugar - 80-100 Checks Blood Sugar - every other day  Blood Thinner Instructions: n/a Aspirin Instructions: Last Dose:  Activity level: Can go up a flight of stairs and perform activities of daily living without stopping and without symptoms of chest pain or shortness of breath.    Anesthesia review: PONV, shakes post anesthesia, dysautonomia, pacemaker, a fib, CVA, asthma, HTN, fatty liver, finished antibiotic for UTI 01/23/21, rash to hands no open skin, PCP aware  Patient denies shortness of breath, fever, cough and chest pain at PAT appointment   Patient verbalized understanding of instructions that were given to them at the PAT appointment. Patient was also instructed that they will need to review over the PAT instructions again at home before surgery.

## 2021-01-18 NOTE — Patient Instructions (Addendum)
DUE TO COVID-19 ONLY ONE VISITOR IS ALLOWED TO COME WITH YOU AND STAY IN THE WAITING ROOM ONLY DURING PRE OP AND PROCEDURE.   **NO VISITORS ARE ALLOWED IN THE SHORT STAY AREA OR RECOVERY ROOM!!**  IF YOU WILL BE ADMITTED INTO THE HOSPITAL YOU ARE ALLOWED ONLY TWO SUPPORT PEOPLE DURING VISITATION HOURS ONLY (7AM -8PM)   The support person(s) may change daily. The support person(s) must pass our screening, gel in and out, and wear a mask at all times, including in the patient's room. Patients must also wear a mask when staff or their support person are in the room.  No visitors under the age of 21. Any visitor under the age of 23 must be accompanied by an adult.    COVID SWAB TESTING MUST BE COMPLETED ON:  01/26/21 **MUST PRESENT COMPLETED FORM AT TESTING SITE**    [facility] [place] (backside of the building) Open 8am-3pm. No appointment needed. You are not required to quarantine, however you are required to wear a well-fitted mask when you are out and around people not in your household.  Hand Hygiene often Do NOT share personal items Notify your provider if you are in close contact with someone who has COVID or you develop fever 100.4 or greater, new onset of sneezing, cough, sore throat, shortness of breath or body aches.       Your procedure is scheduled on: 01/30/21   Report to [facility] Main Entrance    Report to admitting at 11:15 AM   Call this number if you have problems the morning of surgery [phone]   Do not eat food :After Midnight.   May have liquids until 10:30 AM day of surgery  CLEAR LIQUID DIET  Foods Allowed                                                                     Foods Excluded  Water, Black Coffee and tea (no milk or creamer)           liquids that you cannot  Plain Jell-O in any flavor  (No red)                                    see through such as: Fruit ices (not with fruit pulp)                                             milk, soups, orange juice              Iced Popsicles (No red)                                              All solid food                                   Apple  juices Sports drinks like Gatorade (No red) Lightly seasoned clear broth or consume(fat free) Sugar   Oral Hygiene is also important to reduce your risk of infection.                                    Remember - BRUSH YOUR TEETH THE MORNING OF SURGERY WITH YOUR REGULAR TOOTHPASTE   Take these medicines the morning of surgery with A SIP OF WATER: Tylenol, Inhalers, Klonopin, Clonidine, Keppra, Metoprolol.   How to Manage Your Diabetes Before and After Surgery  Why is it important to control my blood sugar before and after surgery? Improving blood sugar levels before and after surgery helps healing and can limit problems. A way of improving blood sugar control is eating a healthy diet by:  Eating less sugar and carbohydrates  Increasing activity/exercise  Talking with your doctor about reaching your blood sugar goals High blood sugars (greater than 180 mg/dL) can raise your risk of infections and slow your recovery, so you will need to focus on controlling your diabetes during the weeks before surgery. Make sure that the doctor who takes care of your diabetes knows about your planned surgery including the date and location.  How do I manage my blood sugar before surgery? Check your blood sugar at least 4 times a day, starting 2 days before surgery, to make sure that the level is not too high or low. Check your blood sugar the morning of your surgery when you wake up and every 2 hours until you get to the Short Stay unit. If your blood sugar is less than 70 mg/dL, you will need to treat for low blood sugar: Do not take insulin. Treat a low blood sugar (less than 70 mg/dL) with  cup of clear juice (cranberry or apple), 4 glucose tablets, OR glucose gel. Recheck blood sugar in 15 minutes after treatment (to make sure it is  greater than 70 mg/dL). If your blood sugar is not greater than 70 mg/dL on recheck, call [phone] for further instructions. Report your blood sugar to the short stay nurse when you get to Short Stay.  If you are admitted to the hospital after surgery: Your blood sugar will be checked by the staff and you will probably be given insulin after surgery (instead of oral diabetes medicines) to make sure you have good blood sugar levels. The goal for blood sugar control after surgery is 80-180 mg/dL.             You may not have any metal on your body including hair pins, jewelry, and body piercing             Do not wear make-up, lotions, powders, perfumes, or deodorant  Do not wear nail polish including gel and S&S, artificial/acrylic nails, or any other type of covering on natural nails including finger and toenails. If you have artificial nails, gel coating, etc. that needs to be removed by a nail salon please have this removed prior to surgery or surgery may need to be canceled/ delayed if the surgeon/ anesthesia feels like they are unable to be safely monitored.   Do not shave  48 hours prior to surgery.    Do not bring valuables to the hospital. [place].   Contacts, dentures or bridgework may  not be worn into surgery.   Bring small overnight bag day of surgery.              Please read over the following fact sheets you were given: IF YOU HAVE QUESTIONS ABOUT YOUR PRE-OP INSTRUCTIONS PLEASE CALL [place] - Preparing for Surgery Before surgery, you can play an important role.  Because skin is not sterile, your skin needs to be as free of germs as possible.  You can reduce the number of germs on your skin by washing with CHG (chlorahexidine gluconate) soap before surgery.  CHG is an antiseptic cleaner which kills germs and bonds with the skin to continue killing germs even after washing. Please DO NOT use if you  have an allergy to CHG or antibacterial soaps.  If your skin becomes reddened/irritated stop using the CHG and inform your nurse when you arrive at Short Stay. Do not shave (including legs and underarms) for at least 48 hours prior to the first CHG shower.  You may shave your face/neck.  Please follow these instructions carefully:  1.  Shower with CHG Soap the night before surgery and the  morning of surgery.  2.  If you choose to wash your hair, wash your hair first as usual with your normal  shampoo.  3.  After you shampoo, rinse your hair and body thoroughly to remove the shampoo.                             4.  Use CHG as you would any other liquid soap.  You can apply chg directly to the skin and wash.  Gently with a scrungie or clean washcloth.  5.  Apply the CHG Soap to your body ONLY FROM THE NECK DOWN.   Do   not use on face/ open                           Wound or open sores. Avoid contact with eyes, ears mouth and   genitals (private parts).                       Wash face,  Genitals (private parts) with your normal soap.             6.  Wash thoroughly, paying special attention to the area where your    surgery  will be performed.  7.  Thoroughly rinse your body with warm water from the neck down.  8.  DO NOT shower/wash with your normal soap after using and rinsing off the CHG Soap.                9.  Pat yourself dry with a clean towel.            10.  Wear clean pajamas.            11.  Place clean sheets on your bed the night of your first shower and do not  sleep with pets. Day of Surgery : Do not apply any lotions/deodorants the morning of surgery.  Please wear clean clothes to the hospital/surgery center.  FAILURE TO FOLLOW THESE INSTRUCTIONS MAY RESULT IN THE CANCELLATION OF YOUR SURGERY  PATIENT SIGNATURE_________________________________  NURSE SIGNATURE__________________________________  ________________________________________________________________________

## 2021-01-18 NOTE — Progress Notes (Signed)
[facility]GRAM[facility]  Patient Information: Name:  TOREY REGAN  DOB:  10/05/1955  MRN:  [id number]    Planned Procedure:  Robotic assisted hiatal hernia repair  Surgeon:  Dr. Hassell Done  Date of Procedure:  01/30/21  Cautery will be used.  Position during surgery:  unknown   Please send documentation back to:  [facility] (Fax # [phone])  Device Information:  Clinic EP Physician:  Virl Axe, MD   Device Type:  Pacemaker Manufacturer and Phone #:  [employer]: [phone] Pacemaker Dependent?:  Unknown Date of Last Device Check:  04/12/20 (remote) Normal Device Function?:  Yes.    Electrophysiologist's Recommendations:  Have magnet available. Provide continuous ECG monitoring when magnet is used or reprogramming is to be performed.  Procedure may interfere with device function.  Magnet should be placed over device during procedure.  Per Device Clinic Standing Orders, Simone Curia, RN  4:15 PM 01/18/2021

## 2021-01-19 LAB — URINE CULTURE
MICRO NUMBER:: 12669945
SPECIMEN QUALITY:: ADEQUATE

## 2021-01-22 NOTE — Progress Notes (Signed)
urine culture shows e coli  sensitive to medication given . Should resolve with current treatment .FU if not better.

## 2021-01-23 ENCOUNTER — Encounter (HOSPITAL_COMMUNITY): Payer: Self-pay

## 2021-01-23 ENCOUNTER — Other Ambulatory Visit: Payer: Self-pay

## 2021-01-23 ENCOUNTER — Encounter (HOSPITAL_COMMUNITY)
Admission: RE | Admit: 2021-01-23 | Discharge: 2021-01-23 | Disposition: A | Discharge status: Home / Self Care | Payer: Medicare Other | Source: Ambulatory Visit | Attending: Surgery | Admitting: Surgery

## 2021-01-23 VITALS — BP 123/93 | HR 66 | Temp 98.3°F | Resp 14 | Ht 63.0 in | Wt 143.2 lb

## 2021-01-23 DIAGNOSIS — Z01812 Encounter for preprocedural laboratory examination: Secondary | ICD-10-CM | POA: Insufficient documentation

## 2021-01-23 DIAGNOSIS — E119 Type 2 diabetes mellitus without complications: Secondary | ICD-10-CM | POA: Diagnosis not present

## 2021-01-23 HISTORY — DX: Anxiety disorder, unspecified: F41.9

## 2021-01-23 LAB — GLUCOSE, CAPILLARY: Glucose-Capillary: 86 mg/dL (ref 70–99)

## 2021-01-23 NOTE — Progress Notes (Signed)
Spoke with Otilio Connors from [facility] and let them know patient finished antibiotic for UTI 01/23/21.

## 2021-01-23 NOTE — Progress Notes (Signed)
Just saw this message  Hopefully follow-up urinalysis was done at your preop and if not can be repeated   cannot answer the question about whether he should proceed to surgery as scheduled He is advised what symptoms are left.

## 2021-01-24 NOTE — Addendum Note (Signed)
Addended by: Nilda Riggs on: 01/24/2021 08:41 AM   Modules accepted: Orders

## 2021-01-25 ENCOUNTER — Other Ambulatory Visit: Payer: Self-pay

## 2021-01-25 ENCOUNTER — Telehealth: Payer: Self-pay

## 2021-01-25 DIAGNOSIS — R300 Dysuria: Secondary | ICD-10-CM

## 2021-01-25 DIAGNOSIS — R3989 Other symptoms and signs involving the genitourinary system: Secondary | ICD-10-CM

## 2021-01-25 LAB — POC URINALSYSI DIPSTICK (AUTOMATED)
Bilirubin, UA: NEGATIVE
Blood, UA: NEGATIVE
Glucose, UA: NEGATIVE
Ketones, UA: NEGATIVE
Nitrite, UA: NEGATIVE
Protein, UA: NEGATIVE
Spec Grav, UA: 1.02 (ref 1.010–1.025)
Urobilinogen, UA: 0.2 E.U./dL
pH, UA: 6 (ref 5.0–8.0)

## 2021-01-25 LAB — HEMOGLOBIN A1C
Hgb A1c MFr Bld: 5.8 % — ABNORMAL HIGH (ref 4.8–5.6)
Mean Plasma Glucose: 120 mg/dL

## 2021-01-25 MED ORDER — CEPHALEXIN 500 MG PO CAPS: 500.0000 mg | ORAL_CAPSULE | Freq: Four times a day (QID) | ORAL | 0 refills | Status: DC

## 2021-01-25 NOTE — Progress Notes (Signed)
Urine improved but still white blood cells sending in Keflex to take for 7 days.  Treating for relapsing infection.  See other messaging sent into p [facility]

## 2021-01-25 NOTE — Addendum Note (Signed)
Addended byBurnis Medin on: 01/25/2021 05:34 PM   Modules accepted: Orders

## 2021-01-25 NOTE — Telephone Encounter (Signed)
I spoke with the pt and she stated that she wants to repeat UA before her scheduled surgery. The pt stated she reached out to Dr. Hassell Done office the MD that is doing her surgery and she is awaiting response in regards to next steps for surgery. Pt reported that her UTI symptoms are recurring and she has been experiencing burning during urination and lower abdominal pain, x2 days. Pt informed me that she completed antibiotic Cefdinir on this past Sunday. Pt is concerned with E-coli reading for UTI and stated she went to eat at a smokehouse and was wondering if the meat she ate could cause infection.

## 2021-01-25 NOTE — Telephone Encounter (Signed)
Meat is not going to cause urine infection.   The germ E. coli in the urine is sensitive or dyes with the antibiotic given however sometimes a longer treatment is needed at a higher concentration in the urine.  The repeat urine has some white blood cells but not as bad as the original I am going to send in Keflex for 7 days to retreat.

## 2021-01-26 NOTE — Telephone Encounter (Signed)
Pt informed of the message and verbalized understanding. Pt stated that she will have to postpone her surgery until she is feeling better.

## 2021-01-30 DIAGNOSIS — E119 Type 2 diabetes mellitus without complications: Secondary | ICD-10-CM

## 2021-03-08 NOTE — Progress Notes (Signed)
[facility] lab testing on 03/23/21 at 100pm.  Come thru Main Entrance door at [facility]  have a seat in the lobby on the right as you come thru the door.  Call [phone] and give them your name and let them know you are here for covid testing    Your procedure is scheduled on:  03/27/2021.    Report to [facility] Main  Entrance   Report to admitting at   [phone]     Call this number if you have problems the morning of surgery [phone]    REMEMBER: NO  SOLID FOOD CANDY OR GUM AFTER MIDNIGHT. CLEAR LIQUIDS UNTIL   0730am        . NOTHING BY MOUTH EXCEPT CLEAR LIQUIDS UNTIL  0730am   . PLEASE FINISH ENSURE DRINK PER SURGEON ORDER  WHICH NEEDS TO BE COMPLETED AT   0730am    .      CLEAR LIQUID DIET   Foods Allowed                                                                    Coffee and tea, regular and decaf                            Fruit ices (not with fruit pulp)                                      Iced Popsicles                                    Carbonated beverages, regular and diet                                    Cranberry, grape and apple juices Sports drinks like Gatorade Lightly seasoned clear broth or consume(fat free) Sugar, honey syrup ___________________________________________________________________      BRUSH YOUR TEETH MORNING OF SURGERY AND RINSE YOUR MOUTH OUT, NO CHEWING GUM CANDY OR MINTS.     Take these medicines the morning of surgery with A SIP OF WATER:  nebulizer if needed, inhalers as usual and bring, keppra, metoprolol   DO NOT TAKE ANY DIABETIC MEDICATIONS DAY OF YOUR SURGERY                               You may not have any metal on your body including hair pins and              piercings  Do not wear jewelry, make-up, lotions, powders or perfumes, deodorant             Do not wear nail polish on your fingernails.  Do not shave  48 hours prior to surgery.              Men may shave face and neck.   Do not bring  valuables to the hospital. [place]  NOT             RESPONSIBLE   FOR VALUABLES.  Contacts, dentures or bridgework may not be worn into surgery.  Leave suitcase in the car. After surgery it may be brought to your room.     Patients discharged the day of surgery will not be allowed to drive home. IF YOU ARE HAVING SURGERY AND GOING HOME THE SAME DAY, YOU MUST HAVE AN ADULT TO DRIVE YOU HOME AND BE WITH YOU FOR 24 HOURS. YOU MAY GO HOME BY TAXI OR UBER OR ORTHERWISE, BUT AN ADULT MUST ACCOMPANY YOU HOME AND STAY WITH YOU FOR 24 HOURS.  Name and phone number of your driver:  Special Instructions: N/A              Please read over the following fact sheets you were given: _____________________________________________________________________  [facility] - Preparing for Surgery Before surgery, you can play an important role.  Because skin is not sterile, your skin needs to be as free of germs as possible.  You can reduce the number of germs on your skin by washing with CHG (chlorahexidine gluconate) soap before surgery.  CHG is an antiseptic cleaner which kills germs and bonds with the skin to continue killing germs even after washing. Please DO NOT use if you have an allergy to CHG or antibacterial soaps.  If your skin becomes reddened/irritated stop using the CHG and inform your nurse when you arrive at Short Stay. Do not shave (including legs and underarms) for at least 48 hours prior to the first CHG shower.  You may shave your face/neck. Please follow these instructions carefully:  1.  Shower with CHG Soap the night before surgery and the  morning of Surgery.  2.  If you choose to wash your hair, wash your hair first as usual with your  normal  shampoo.  3.  After you shampoo, rinse your hair and body thoroughly to remove the  shampoo.                           4.  Use CHG as you would any other liquid soap.  You can apply chg directly  to the skin and wash                       Gently  with a scrungie or clean washcloth.  5.  Apply the CHG Soap to your body ONLY FROM THE NECK DOWN.   Do not use on face/ open                           Wound or open sores. Avoid contact with eyes, ears mouth and genitals (private parts).                       Wash face,  Genitals (private parts) with your normal soap.             6.  Wash thoroughly, paying special attention to the area where your surgery  will be performed.  7.  Thoroughly rinse your body with warm water from the neck down.  8.  DO NOT shower/wash with your normal soap after using and rinsing off  the CHG Soap.                9.  Pat yourself dry with a clean towel.  10.  Wear clean pajamas.            11.  Place clean sheets on your bed the night of your first shower and do not  sleep with pets. Day of Surgery : Do not apply any lotions/deodorants the morning of surgery.  Please wear clean clothes to the hospital/surgery center.  FAILURE TO FOLLOW THESE INSTRUCTIONS MAY RESULT IN THE CANCELLATION OF YOUR SURGERY PATIENT SIGNATURE_________________________________  NURSE SIGNATURE__________________________________  ________________________________________________________________________

## 2021-03-08 NOTE — Progress Notes (Addendum)
Anesthesia Review:  PCP: DR [name]  LOV 01/17/21 and 12/26/20   Cardiologist : DR Virl Axe- LOv 05/31/20  Chest x-ray : EKG :04/07/20  Echo :05/31/20  Device- pacemaker Last check 2/15/222  Periop device orders on chart  Stress test: Cardiac Cath :  Activity level: can do a flgiht of stairs without difficulty  Sleep Study/ CPAP : none  Fasting Blood Sugar :      / Checks Blood Sugar -- times a day:   Blood Thinner/ Instructions /Last Dose: ASA / Instructions/ Last Dose :   DM-  type 2 diet controlled  Hgba1c- 03/09/21- 5.6

## 2021-03-09 ENCOUNTER — Encounter (HOSPITAL_COMMUNITY)
Admission: RE | Admit: 2021-03-09 | Discharge: 2021-03-09 | Disposition: A | Discharge status: Home / Self Care | Payer: Medicare Other | Source: Ambulatory Visit | Attending: Surgery | Admitting: Surgery

## 2021-03-09 ENCOUNTER — Other Ambulatory Visit: Payer: Self-pay

## 2021-03-09 ENCOUNTER — Encounter (HOSPITAL_COMMUNITY): Payer: Self-pay

## 2021-03-09 VITALS — BP 134/85 | HR 66 | Temp 97.9°F | Resp 18 | Wt 145.0 lb

## 2021-03-09 DIAGNOSIS — Z20822 Contact with and (suspected) exposure to covid-19: Secondary | ICD-10-CM | POA: Diagnosis not present

## 2021-03-09 DIAGNOSIS — Z01812 Encounter for preprocedural laboratory examination: Secondary | ICD-10-CM | POA: Insufficient documentation

## 2021-03-09 DIAGNOSIS — E119 Type 2 diabetes mellitus without complications: Secondary | ICD-10-CM | POA: Diagnosis not present

## 2021-03-09 DIAGNOSIS — Z01818 Encounter for other preprocedural examination: Secondary | ICD-10-CM

## 2021-03-09 DIAGNOSIS — Z950 Presence of cardiac pacemaker: Secondary | ICD-10-CM

## 2021-03-09 HISTORY — DX: Disorder of the autonomic nervous system, unspecified: G90.9

## 2021-03-09 LAB — BASIC METABOLIC PANEL
Anion gap: 9 (ref 5–15)
BUN: 16 mg/dL (ref 8–23)
CO2: 24 mmol/L (ref 22–32)
Calcium: 9.4 mg/dL (ref 8.9–10.3)
Chloride: 104 mmol/L (ref 98–111)
Creatinine, Ser: 0.81 mg/dL (ref 0.44–1.00)
GFR, Estimated: >60 mL/min (ref >60)
Glucose, Bld: 95 mg/dL (ref 70–99)
Potassium: 4.4 mmol/L (ref 3.5–5.1)
Sodium: 137 mmol/L (ref 135–145)

## 2021-03-09 LAB — CBC
HCT: 43.1 % (ref 36.0–46.0)
Hemoglobin: 14.1 g/dL (ref 12.0–15.0)
MCH: 29 pg (ref 26.0–34.0)
MCHC: 32.7 g/dL (ref 30.0–36.0)
MCV: 88.7 fL (ref 80.0–100.0)
Platelets: 291 10*3/uL (ref 150–400)
RBC: 4.86 MIL/uL (ref 3.87–5.11)
RDW: 12.5 % (ref 11.5–15.5)
WBC: 6.6 10*3/uL (ref 4.0–10.5)
nRBC: 0 % (ref 0.0–0.2)

## 2021-03-09 LAB — HEMOGLOBIN A1C
Hgb A1c MFr Bld: 5.6 % (ref 4.8–5.6)
Mean Plasma Glucose: 114.02 mg/dL

## 2021-03-09 LAB — GLUCOSE, CAPILLARY: Glucose-Capillary: 103 mg/dL — ABNORMAL HIGH (ref 70–99)

## 2021-03-12 ENCOUNTER — Telehealth: Payer: Medicare Other | Admitting: Nurse Practitioner

## 2021-03-12 DIAGNOSIS — U071 COVID-19: Secondary | ICD-10-CM | POA: Diagnosis not present

## 2021-03-12 MED ORDER — FLUTICASONE PROPIONATE 50 MCG/ACT NA SUSP: 2.0000 | Freq: Every day | NASAL | 0 refills | Status: AC

## 2021-03-12 MED ORDER — NIRMATRELVIR/RITONAVIR (PAXLOVID)TABLET: 3.0000 | ORAL_TABLET | Freq: Two times a day (BID) | ORAL | 0 refills | Status: AC

## 2021-03-12 NOTE — Patient Instructions (Addendum)
[name], thank you for joining Reynolds Bowl, NP for today's virtual visit.  While this provider is not your primary care provider (PCP), if your PCP is located in our provider database this encounter information will be shared with them immediately following your visit.  Consent: (Patient) [name] provided verbal consent for this virtual visit at the beginning of the encounter.  Current Medications:  Current Outpatient Medications:    fluticasone (FLONASE) 50 MCG/ACT nasal spray, Place 2 sprays into both nostrils daily., Disp: 16 g, Rfl: 0   nirmatrelvir/ritonavir EUA (PAXLOVID) 20 x 150 MG & 10 x 100MG  TABS, Take 3 tablets by mouth 2 (two) times daily for 5 days. (Take nirmatrelvir 150 mg two tablets twice daily for 5 days and ritonavir 100 mg one tablet twice daily for 5 days) Patient GFR is 0.81 on 03/09/2021., Disp: 30 tablet, Rfl: 0   acetaminophen (TYLENOL) 500 MG tablet, Take 500 mg by mouth every 6 (six) hours as needed for moderate pain., Disp: , Rfl:    albuterol (PROVENTIL) (2.5 MG/3ML) 0.083% nebulizer solution, Take 3 mLs (2.5 mg total) by nebulization every 4 (four) hours as needed for wheezing or shortness of breath., Disp: 180 mL, Rfl: 1   albuterol (VENTOLIN HFA) 108 (90 Base) MCG/ACT inhaler, INHALE 2 PUFFS INTO THE LUNGS EVERY 4 (FOUR) HOURS AS NEEDED FOR WHEEZING OR SHORTNESS OF BREATH., Disp: 18 Inhaler, Rfl: 0   augmented betamethasone dipropionate (DIPROLENE) 0.05 % ointment, Apply topically 2 (two) times daily. Limit to no longer than 2 weeks use. Not on face., Disp: 50 g, Rfl: 0   calcium carbonate (TUMS - DOSED IN MG ELEMENTAL CALCIUM) 500 MG chewable tablet, Chew 2 tablets by mouth daily as needed for indigestion or heartburn., Disp: , Rfl:    CALCIUM PO, Take 1 tablet by mouth daily., Disp: , Rfl:    cephALEXin (KEFLEX) 500 MG capsule, Take 1 capsule (500 mg total) by mouth 4 (four) times daily. For relapsing UTI, Disp: 28 capsule, Rfl: 0    cholestyramine (QUESTRAN) 4 g packet, Take 4 g by mouth 2 (two) times daily., Disp: , Rfl:    clobetasol cream (TEMOVATE) 5.46 %, Apply 1 application topically 2 (two) times daily as needed (rash)., Disp: , Rfl:    clonazePAM (KLONOPIN) 1 MG tablet, TAKE 1 TABLET BY MOUTH TWICE A DAY MAY TAKE 3X PER DAY IF NEEDED FOR EXTRA ANXIETY, Disp: 70 tablet, Rfl: 0   cloNIDine (CATAPRES) 0.1 MG tablet, Take 1 tablet (0.1 mg total) by mouth at bedtime., Disp: 90 tablet, Rfl: 3   cyanocobalamin (,VITAMIN B-12,) 1000 MCG/ML injection, Inject 1 mL (1,000 mcg total) into the muscle every 30 (thirty) days., Disp: 1 mL, Rfl: 12   Desoximetasone (TOPICORT) 0.25 % ointment, Apply 1 application topically 2 (two) times daily. (Patient taking differently: Apply 1 application topically 2 (two) times daily as needed (rash).), Disp: 30 g, Rfl: 1   diphenhydrAMINE (BENADRYL) 25 MG tablet, Take 25 mg by mouth daily as needed for allergies., Disp: , Rfl:    EPINEPHrine 0.3 mg/0.3 mL IJ SOAJ injection, Inject 0.3 mg into the muscle as needed for anaphylaxis., Disp: , Rfl:    fluorouracil (EFUDEX) 5 % cream, Apply 1 application topically 2 (two) times daily as needed (rash)., Disp: , Rfl:    fluticasone (FLOVENT HFA) 44 MCG/ACT inhaler, Inhale 2 puffs into the lungs 2 (two) times daily as needed (shortness of breath)., Disp: , Rfl:    hyoscyamine (LEVSIN SL)  0.125 MG SL tablet, Place 0.125 mg under the tongue every 6 (six) hours as needed for cramping., Disp: , Rfl:    levETIRAcetam (KEPPRA) 250 MG tablet, Take 250 mg by mouth See admin instructions. Take 250mg  three times a day at 6am, 12pm, 6pm., Disp: , Rfl:    levocetirizine (XYZAL) 5 MG tablet, TAKE 1 TABLET BY MOUTH EVERY DAY AS NEEDED (Patient taking differently: Take 5 mg by mouth daily as needed for allergies.), Disp: 30 tablet, Rfl: 0   lipase/protease/amylase (CREON) 12000 units CPEP capsule, Take 12,000 Units by mouth daily with breakfast., Disp: , Rfl:    metoprolol  tartrate (LOPRESSOR) 25 MG tablet, Take 1 tablet (25 mg total) by mouth 2 (two) times daily., Disp: 180 tablet, Rfl: 3   nystatin (MYCOSTATIN) 100000 UNIT/ML suspension, Use as directed 5 mLs (500,000 Units total) in the mouth or throat 3 (three) times daily as needed (thrush)., Disp: 60 mL, Rfl: 2   Olopatadine HCl (PATADAY) 0.2 % SOLN, Use one drop in each eye once daily as needed. (Patient taking differently: Place 1 drop into both eyes daily as needed (itchy eyes).), Disp: 2.5 mL, Rfl: 5   polyethylene glycol (MIRALAX / GLYCOLAX) packet, Take 17 g by mouth at bedtime., Disp: , Rfl:    potassium chloride SA (K-DUR,KLOR-CON) 20 MEQ tablet, Take 1 tablet (20 mEq total) by mouth 2 (two) times daily., Disp: 60 tablet, Rfl: 3   prochlorperazine (COMPAZINE) 10 MG tablet, Take 1 tablet (10 mg total) by mouth every 6 (six) hours as needed for nausea or vomiting., Disp: 60 tablet, Rfl: 0   sodium chloride (OCEAN) 0.65 % SOLN nasal spray, Place 1 spray into both nostrils as needed for congestion. , Disp: , Rfl:    Spacer/Aero-Holding Chambers ([place]) MISC, See admin instructions., Disp: , Rfl:    Syringe/Needle, Disp, (SYRINGE 3CC/27GX1-1/4") 27G X 1-1/4" 3 ML MISC, Inject 1 mL into the muscle every 30 (thirty) days. Vit B12, Disp: 12 each, Rfl: 0   Vitamin D, Ergocalciferol, (DRISDOL) 50000 units CAPS capsule, TAKE 1 CAPSULE (50,000 UNITS TOTAL) BY MOUTH EVERY 7 (SEVEN) DAYS., Disp: 4 capsule, Rfl: 11   Medications ordered in this encounter:  Meds ordered this encounter  Medications   nirmatrelvir/ritonavir EUA (PAXLOVID) 20 x 150 MG & 10 x 100MG  TABS    Sig: Take 3 tablets by mouth 2 (two) times daily for 5 days. (Take nirmatrelvir 150 mg two tablets twice daily for 5 days and ritonavir 100 mg one tablet twice daily for 5 days) Patient GFR is 0.81 on 03/09/2021.    Dispense:  30 tablet    Refill:  0   fluticasone (FLONASE) 50 MCG/ACT nasal spray    Sig: Place 2 sprays into both nostrils  daily.    Dispense:  16 g    Refill:  0     *If you need refills on other medications prior to your next appointment, please contact your pharmacy*  Follow-Up: Call back or seek an in-person evaluation if the symptoms worsen or if the condition fails to improve as anticipated.  Other Instructions Will prescribe Paxlovid for COVID-19 symptoms. Will also provide Flonase to help with nasal and ear symptoms. Patient's pre-exisiting conditions make her a suitable candidate. Patient encouraged to continue symptomatic treatment such as Ibuprofen or Tylenol, increasing fluids and getting plenty of rest. Patient to follow up with her surgeon regarding COVID testing and how to proceed at this time.    If you have been  instructed to have an in-person evaluation today at a local Urgent Care facility, please use the link below. It will take you to a list of all of our available [facility], including address, phone number and hours of operation. Please do not delay care.  [facility]  If you or a family member do not have a primary care provider, use the link below to schedule a visit and establish care. When you choose a [facility] primary care physician or advanced practice provider, you gain a long-term partner in health. Find a Primary Care Provider  Learn more about [facility] in-office and virtual care options: [facility]

## 2021-03-12 NOTE — Progress Notes (Signed)
Virtual Visit Consent   [name], you are scheduled for a virtual visit with a [facility] provider today.     Just as with appointments in the office, your consent must be obtained to participate.  Your consent will be active for this visit and any virtual visit you may have with one of our providers in the next 365 days.     If you have a MyChart account, a copy of this consent can be sent to you electronically.  All virtual visits are billed to your insurance company just like a traditional visit in the office.    As this is a virtual visit, video technology does not allow for your provider to perform a traditional examination.  This may limit your provider's ability to fully assess your condition.  If your provider identifies any concerns that need to be evaluated in person or the need to arrange testing (such as labs, EKG, etc.), we will make arrangements to do so.     Although advances in technology are sophisticated, we cannot ensure that it will always work on either your end or our end.  If the connection with a video visit is poor, the visit may have to be switched to a telephone visit.  With either a video or telephone visit, we are not always able to ensure that we have a secure connection.     I need to obtain your verbal consent now.   Are you willing to proceed with your visit today? Yes  [name] has provided verbal consent on 03/12/2021 for a virtual visit (video or telephone).   Reynolds Bowl, NP   Date: 03/12/2021 12:44 PM   Virtual Visit via Video Note   I, Reynolds Bowl, connected with  [name]  ([id number], Jul 02, 1958) on 03/12/21 at 12:45 PM EST by a video-enabled telemedicine application and verified that I am speaking with the correct person using two identifiers.  Location: Patient: Virtual Visit Location Patient: Home Provider: Virtual Visit Location Provider: Home   I discussed the limitations of evaluation and management by  telemedicine and the availability of in person appointments. The patient expressed understanding and agreed to proceed.    History of Present Illness: [name] is a 66 y.o. who identifies as a female who was assigned female at birth, and is being seen today for a positive COVID test. Patient reports her husband was diagnosed on yesterday. Today she work up with headache, dizziness, bodyaches, bilateral ear stuffiness, nasal congestion. She denies fever, cough, GI symptoms. She reports she had one COVID vaccine but had a reaction. She also reports a history of cancer and being immunocompromised.   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   S/P laparoscopic cholecystectomy 03/28/2018   Dehydration 05/18/2016   History of pacemaker    POTS (postural orthostatic tachycardia syndrome)    Iron deficiency anemia 11/11/2014   Chronic colitis 08/28/2013   Malignant melanoma, metastatic ([place]) 02/09/2013   Malignant melanoma ([place]) 12/01/2012   Brain metastasis ([place]) 10/16/2012   Hypertension 09/30/2012   Anxiety state, unspecified 09/30/2012   Hypoxemia 09/30/2012   Dysautonomia ([place]) 02/25/2011   Anticoagulant long-term use 01/19/2011   Neuritis 01/19/2011  Skin change 01/19/2011   CVA (cerebral infarction)    Palpitation 06/28/2010   Pacemaker-Biotronik 06/28/2010   Dizziness and giddiness 04/27/2010   DIVERTICULOSIS, COLON, HX OF 01/19/2009   CAROTID SINUS SYNDROME 11/17/2008   OTHER MALAISE AND FATIGUE 11/17/2008   HYPERTENSION, BENIGN 11/02/2008   ATRIAL TACHYCARDIA 09/16/2008   SYNCOPE, HX OF 08/09/2008   INJURY UNSPEC NERVE SHOULDER GIRDLE&UPPER LIMB  07/12/2008   PERSONAL HISTORY OF MALIGNANT MELANOMA OF SKIN 07/12/2008   LUQ PAIN 12/08/2007   NECK PAIN 09/26/2007   NUMBNESS, ARM 09/26/2007   CPK, ABNORMAL 09/26/2007   ARM PAIN, LEFT 06/25/2007   SWELLING OF LIMB 06/25/2007   OSTEOPENIA 06/25/2007    Allergies:  Allergies  Allergen Reactions   Bee Venom Swelling   Doxycycline Hives   Erythromycin Hives   Gadavist [Gadobutrol] Hives, Shortness Of Breath and Itching   Penicillins Hives    Did it involve swelling of the face/tongue/throat, SOB, or low BP? No Did it involve sudden or severe rash/hives, skin peeling, or any reaction on the inside of your mouth or nose? Yes Did you need to seek medical attention at a hospital or doctor's office? Yes When did it last happen?      5+ years If all above answers are NO, may proceed with cephalosporin use.    Sulfa Antibiotics Hives   Preparation H [Pramox-Pe-Glycerin-Petrolatum] Hives    Cannot use  Cream or Suppositories    Phenergan [Promethazine Hcl] Other (See Comments)    Causes restless leg syndrome   Medications:  Current Outpatient Medications:    acetaminophen (TYLENOL) 500 MG tablet, Take 500 mg by mouth every 6 (six) hours as needed for moderate pain., Disp: , Rfl:    albuterol (PROVENTIL) (2.5 MG/3ML) 0.083% nebulizer solution, Take 3 mLs (2.5 mg total) by nebulization every 4 (four) hours as needed for wheezing or shortness of breath., Disp: 180 mL, Rfl: 1   albuterol (VENTOLIN HFA) 108 (90 Base) MCG/ACT inhaler, INHALE 2 PUFFS INTO THE LUNGS EVERY 4 (FOUR) HOURS AS NEEDED FOR WHEEZING OR SHORTNESS OF BREATH., Disp: 18 Inhaler, Rfl: 0   augmented betamethasone dipropionate (DIPROLENE) 0.05 % ointment, Apply topically 2 (two) times daily. Limit to no longer than 2 weeks use. Not on face., Disp: 50 g, Rfl: 0   calcium carbonate (TUMS - DOSED IN MG ELEMENTAL CALCIUM) 500 MG chewable tablet, Chew 2 tablets by mouth daily as needed for indigestion or heartburn., Disp: , Rfl:     CALCIUM PO, Take 1 tablet by mouth daily., Disp: , Rfl:    cephALEXin (KEFLEX) 500 MG capsule, Take 1 capsule (500 mg total) by mouth 4 (four) times daily. For relapsing UTI, Disp: 28 capsule, Rfl: 0   cholestyramine (QUESTRAN) 4 g packet, Take 4 g by mouth 2 (two) times daily., Disp: , Rfl:    clobetasol cream (TEMOVATE) 0.45 %, Apply 1 application topically 2 (two) times daily as needed (rash)., Disp: , Rfl:    clonazePAM (KLONOPIN) 1 MG tablet, TAKE 1 TABLET BY MOUTH TWICE A DAY MAY TAKE 3X PER DAY IF NEEDED FOR EXTRA ANXIETY, Disp: 70 tablet, Rfl: 0   cloNIDine (CATAPRES) 0.1 MG tablet, Take 1 tablet (0.1 mg total) by mouth at bedtime., Disp: 90 tablet, Rfl: 3   cyanocobalamin (,VITAMIN B-12,) 1000 MCG/ML injection, Inject 1 mL (1,000 mcg total) into the muscle every 30 (thirty) days., Disp: 1 mL, Rfl: 12   Desoximetasone (TOPICORT) 0.25 % ointment, Apply 1 application topically 2 (two) times daily. (  Patient taking differently: Apply 1 application topically 2 (two) times daily as needed (rash).), Disp: 30 g, Rfl: 1   diphenhydrAMINE (BENADRYL) 25 MG tablet, Take 25 mg by mouth daily as needed for allergies., Disp: , Rfl:    EPINEPHrine 0.3 mg/0.3 mL IJ SOAJ injection, Inject 0.3 mg into the muscle as needed for anaphylaxis., Disp: , Rfl:    fluorouracil (EFUDEX) 5 % cream, Apply 1 application topically 2 (two) times daily as needed (rash)., Disp: , Rfl:    fluticasone (FLOVENT HFA) 44 MCG/ACT inhaler, Inhale 2 puffs into the lungs 2 (two) times daily as needed (shortness of breath)., Disp: , Rfl:    hyoscyamine (LEVSIN SL) 0.125 MG SL tablet, Place 0.125 mg under the tongue every 6 (six) hours as needed for cramping., Disp: , Rfl:    levETIRAcetam (KEPPRA) 250 MG tablet, Take 250 mg by mouth See admin instructions. Take 250mg  three times a day at 6am, 12pm, 6pm., Disp: , Rfl:    levocetirizine (XYZAL) 5 MG tablet, TAKE 1 TABLET BY MOUTH EVERY DAY AS NEEDED (Patient taking differently: Take 5 mg  by mouth daily as needed for allergies.), Disp: 30 tablet, Rfl: 0   lipase/protease/amylase (CREON) 12000 units CPEP capsule, Take 12,000 Units by mouth daily with breakfast., Disp: , Rfl:    metoprolol tartrate (LOPRESSOR) 25 MG tablet, Take 1 tablet (25 mg total) by mouth 2 (two) times daily., Disp: 180 tablet, Rfl: 3   nystatin (MYCOSTATIN) 100000 UNIT/ML suspension, Use as directed 5 mLs (500,000 Units total) in the mouth or throat 3 (three) times daily as needed (thrush)., Disp: 60 mL, Rfl: 2   Olopatadine HCl (PATADAY) 0.2 % SOLN, Use one drop in each eye once daily as needed. (Patient taking differently: Place 1 drop into both eyes daily as needed (itchy eyes).), Disp: 2.5 mL, Rfl: 5   polyethylene glycol (MIRALAX / GLYCOLAX) packet, Take 17 g by mouth at bedtime., Disp: , Rfl:    potassium chloride SA (K-DUR,KLOR-CON) 20 MEQ tablet, Take 1 tablet (20 mEq total) by mouth 2 (two) times daily., Disp: 60 tablet, Rfl: 3   prochlorperazine (COMPAZINE) 10 MG tablet, Take 1 tablet (10 mg total) by mouth every 6 (six) hours as needed for nausea or vomiting., Disp: 60 tablet, Rfl: 0   sodium chloride (OCEAN) 0.65 % SOLN nasal spray, Place 1 spray into both nostrils as needed for congestion. , Disp: , Rfl:    Spacer/Aero-Holding Chambers ([place]) MISC, See admin instructions., Disp: , Rfl:    Syringe/Needle, Disp, (SYRINGE 3CC/27GX1-1/4") 27G X 1-1/4" 3 ML MISC, Inject 1 mL into the muscle every 30 (thirty) days. Vit B12, Disp: 12 each, Rfl: 0   Vitamin D, Ergocalciferol, (DRISDOL) 50000 units CAPS capsule, TAKE 1 CAPSULE (50,000 UNITS TOTAL) BY MOUTH EVERY 7 (SEVEN) DAYS., Disp: 4 capsule, Rfl: 11  Observations/Objective: Patient is well-developed, well-nourished in no acute distress.  Resting comfortably  at home.  Head is normocephalic, atraumatic.  No labored breathing.  Speech is clear and coherent with logical content.  Patient is alert and oriented at baseline.    Assessment  and Plan: 1. COVID-19 - nirmatrelvir/ritonavir EUA (PAXLOVID) 20 x 150 MG & 10 x 100MG  TABS; Take 3 tablets by mouth 2 (two) times daily for 5 days. (Take nirmatrelvir 150 mg two tablets twice daily for 5 days and ritonavir 100 mg one tablet twice daily for 5 days) Patient GFR is 0.81 on 03/09/2021.  Dispense: 30 tablet; Refill: 0 - fluticasone (FLONASE)  50 MCG/ACT nasal spray; Place 2 sprays into both nostrils daily.  Dispense: 16 g; Refill: 0  -Will prescribe Paxlovid for COVID-19 symptoms. Will also provide Flonase to help with nasal and ear symptoms. Patient's pre-exisiting conditions make her a suitable candidate. Patient encouraged to continue symptomatic treatment such as Ibuprofen or Tylenol, increasing fluids and getting plenty of rest. Patient to follow up with her surgeon regarding COVID testing and how to proceed at this time. Patient verbalizes understanding, all questions answered.   Follow Up Instructions: I discussed the assessment and treatment plan with the patient. The patient was provided an opportunity to ask questions and all were answered. The patient agreed with the plan and demonstrated an understanding of the instructions.  A copy of instructions were sent to the patient via MyChart unless otherwise noted below.   The patient was advised to call back or seek an in-person evaluation if the symptoms worsen or if the condition fails to improve as anticipated.  Time:  I spent 15 minutes with the patient via telehealth technology discussing the above problems/concerns.    Reynolds Bowl, NP

## 2021-03-14 ENCOUNTER — Ambulatory Visit (INDEPENDENT_AMBULATORY_CARE_PROVIDER_SITE_OTHER): Payer: Medicare Other

## 2021-03-14 VITALS — Temp 99.0°F | Ht 63.0 in | Wt 145.0 lb

## 2021-03-14 DIAGNOSIS — Z0000 Encounter for general adult medical examination without abnormal findings: Secondary | ICD-10-CM

## 2021-03-14 NOTE — Progress Notes (Signed)
Subjective:   [name] is a 66 y.o. female who presents for Medicare Annual (Subsequent) preventive examination.  Review of Systems    No ROS Cardiac Risk Factors include: advanced age (>66men, >23 women);hypertension    Objective:    Today's Vitals   03/14/21 1303  Temp: 99 F (37.2 C)  SpO2: 97%  Weight: 145 lb (65.8 kg)  Height: 5\' 3"  (1.6 m)   Body mass index is 25.69 kg/m.  Advanced Directives 03/14/2021 03/09/2021 01/23/2021 12/13/2020 11/17/2020 03/22/2020 03/28/2018  Does Patient Have a Medical Advance Directive? Yes Yes Yes Yes Yes Yes Yes  Type of Paramedic of Day;Living will [place];Living will [place];Living will [place];Living will [place];Living will [place];Living will [place];Living will  Does patient want to make changes to medical advance directive? No - Patient declined - - - - - No - Patient declined  Copy of [place] in Chart? No - copy requested - - Yes - validated most recent copy scanned in chart (See row information) - No - copy requested No - copy requested  Pre-existing out of facility DNR order (yellow form or pink MOST form) - - - - - - -    Current Medications (verified) Outpatient Encounter Medications as of 03/14/2021  Medication Sig   acetaminophen (TYLENOL) 500 MG tablet Take 500 mg by mouth every 6 (six) hours as needed for moderate pain.   albuterol (PROVENTIL) (2.5 MG/3ML) 0.083% nebulizer solution Take 3 mLs (2.5 mg total) by nebulization every 4 (four) hours as needed for wheezing or shortness of breath.   albuterol (VENTOLIN HFA) 108 (90 Base) MCG/ACT inhaler INHALE 2 PUFFS INTO THE LUNGS EVERY 4 (FOUR) HOURS AS NEEDED FOR WHEEZING OR SHORTNESS OF BREATH.   augmented betamethasone dipropionate (DIPROLENE) 0.05 % ointment Apply topically 2 (two) times daily. Limit  to no longer than 2 weeks use. Not on face.   calcium carbonate (TUMS - DOSED IN MG ELEMENTAL CALCIUM) 500 MG chewable tablet Chew 2 tablets by mouth daily as needed for indigestion or heartburn.   CALCIUM PO Take 1 tablet by mouth daily.   cephALEXin (KEFLEX) 500 MG capsule Take 1 capsule (500 mg total) by mouth 4 (four) times daily. For relapsing UTI   cholestyramine (QUESTRAN) 4 g packet Take 4 g by mouth 2 (two) times daily.   clobetasol cream (TEMOVATE) 2.53 % Apply 1 application topically 2 (two) times daily as needed (rash).   clonazePAM (KLONOPIN) 1 MG tablet TAKE 1 TABLET BY MOUTH TWICE A DAY MAY TAKE 3X PER DAY IF NEEDED FOR EXTRA ANXIETY   cloNIDine (CATAPRES) 0.1 MG tablet Take 1 tablet (0.1 mg total) by mouth at bedtime.   cyanocobalamin (,VITAMIN B-12,) 1000 MCG/ML injection Inject 1 mL (1,000 mcg total) into the muscle every 30 (thirty) days.   Desoximetasone (TOPICORT) 0.25 % ointment Apply 1 application topically 2 (two) times daily. (Patient taking differently: Apply 1 application topically 2 (two) times daily as needed (rash).)   diphenhydrAMINE (BENADRYL) 25 MG tablet Take 25 mg by mouth daily as needed for allergies.   EPINEPHrine 0.3 mg/0.3 mL IJ SOAJ injection Inject 0.3 mg into the muscle as needed for anaphylaxis.   fluorouracil (EFUDEX) 5 % cream Apply 1 application topically 2 (two) times daily as needed (rash).   fluticasone (FLONASE) 50 MCG/ACT nasal spray Place 2 sprays into both nostrils daily.  fluticasone (FLOVENT HFA) 44 MCG/ACT inhaler Inhale 2 puffs into the lungs 2 (two) times daily as needed (shortness of breath).   hyoscyamine (LEVSIN SL) 0.125 MG SL tablet Place 0.125 mg under the tongue every 6 (six) hours as needed for cramping.   levETIRAcetam (KEPPRA) 250 MG tablet Take 250 mg by mouth See admin instructions. Take 250mg  three times a day at 6am, 12pm, 6pm.   levocetirizine (XYZAL) 5 MG tablet TAKE 1 TABLET BY MOUTH EVERY DAY AS NEEDED (Patient taking  differently: Take 5 mg by mouth daily as needed for allergies.)   lipase/protease/amylase (CREON) 12000 units CPEP capsule Take 12,000 Units by mouth daily with breakfast.   metoprolol tartrate (LOPRESSOR) 25 MG tablet Take 1 tablet (25 mg total) by mouth 2 (two) times daily.   nirmatrelvir/ritonavir EUA (PAXLOVID) 20 x 150 MG & 10 x 100MG  TABS Take 3 tablets by mouth 2 (two) times daily for 5 days. (Take nirmatrelvir 150 mg two tablets twice daily for 5 days and ritonavir 100 mg one tablet twice daily for 5 days) Patient GFR is 0.81 on 03/09/2021.   nystatin (MYCOSTATIN) 100000 UNIT/ML suspension Use as directed 5 mLs (500,000 Units total) in the mouth or throat 3 (three) times daily as needed (thrush).   Olopatadine HCl (PATADAY) 0.2 % SOLN Use one drop in each eye once daily as needed. (Patient taking differently: Place 1 drop into both eyes daily as needed (itchy eyes).)   polyethylene glycol (MIRALAX / GLYCOLAX) packet Take 17 g by mouth at bedtime.   potassium chloride SA (K-DUR,KLOR-CON) 20 MEQ tablet Take 1 tablet (20 mEq total) by mouth 2 (two) times daily.   prochlorperazine (COMPAZINE) 10 MG tablet Take 1 tablet (10 mg total) by mouth every 6 (six) hours as needed for nausea or vomiting.   sodium chloride (OCEAN) 0.65 % SOLN nasal spray Place 1 spray into both nostrils as needed for congestion.    Spacer/Aero-Holding Chambers ([place]) MISC See admin instructions.   Syringe/Needle, Disp, (SYRINGE 3CC/27GX1-1/4") 27G X 1-1/4" 3 ML MISC Inject 1 mL into the muscle every 30 (thirty) days. Vit B12   Vitamin D, Ergocalciferol, (DRISDOL) 50000 units CAPS capsule TAKE 1 CAPSULE (50,000 UNITS TOTAL) BY MOUTH EVERY 7 (SEVEN) DAYS.   No facility-administered encounter medications on file as of 03/14/2021.    Allergies (verified) Bee venom, Doxycycline, Erythromycin, Gadavist [gadobutrol], Penicillins, Sulfa antibiotics, Preparation h [pramox-pe-glycerin-petrolatum], and Phenergan  [promethazine hcl]   History: Past Medical History:  Diagnosis Date   Anemia    Angio-edema    Anxiety    Arthritis    Asthma    Atrial fibrillation (HCC)    Atrial tachycardia (HCC)    Autonomic dysfunction    Cervicalgia    Complication of anesthesia    pt states wakes up with "shakes"   CVA (cerebral infarction)    2012 with dizziness and vision change felt embolic from atrial tachy   Disorder of bone and cartilage, unspecified    Disturbance of skin sensation    Diverticulosis    DM (diabetes mellitus) ([place])    DVT (deep venous thrombosis) (HCC)    DVT (deep venous thrombosis) ([place])    Eczema    Family history of anesthesia complication    PONV and " shaking "   Fatty liver    GERD (gastroesophageal reflux disease)    History of hiatal hernia    History of pacemaker    History of radiation therapy 10/24/2012   brain  HLD (hyperlipidemia)    HTN (hypertension)    Hypoglycemia, unspecified    Injury to unspecified nerve of shoulder girdle and upper limb    Liver metastasis (HCC)    Lung metastasis (HCC)    Metastasis to brain (HCC)    Osteopenia    Other nonspecific abnormal serum enzyme levels    Other specified congenital anomalies of nervous system    Pacemaker    autonomic dysfunction   Pancreatitis    from therapy   Peripheral vascular disease (HCC)    PONV (postoperative nausea and vomiting)    POTS (postural orthostatic tachycardia syndrome)    Recurrent upper respiratory infection (URI)    Skin cancer    Hx: of lung lesion   Stroke ([place])    left weaker    Swelling of limb    Urticaria    Past Surgical History:  Procedure Laterality Date   ABLATION SAPHENOUS VEIN W/ RFA     2002   ADENOIDECTOMY     CARPAL TUNNEL RELEASE Left    CHOLECYSTECTOMY  03/28/2018   CHOLECYSTECTOMY N/A 03/28/2018   Procedure: LAPAROSCOPIC CHOLECYSTECTOMY WITH INTRAOPERATIVE CHOLANGIOGRAM;  Surgeon: Johnathan Hausen, MD;  Location: [facility];  Service: General;  Laterality:  N/A;   CRANIOTOMY N/A 09/30/2012   suboccipital craniectomy   CRANIOTOMY Left 02/09/2013   Procedure: LEFT PARIETAL CRANIOTOMY with stealth;  Surgeon: [name], MD;  Location: [facility];  Service: Neurosurgery;  Laterality: Left;  LEFT Parietal Craniotomy for tumor with stealth   DEEP AXILLARY SENTINEL NODE BIOPSY / EXCISION     due to extensive Melanoma-right arm   fatty tumor removed     from chest   INSERT / REPLACE / REMOVE PACEMAKER  2012   1999, x 3   LAPAROSCOPY  09/06/2011   Procedure: LAPAROSCOPY OPERATIVE;  Surgeon: Claiborne Billings A. Pamala Hurry, MD;  Location: [facility];  Service: Gynecology;  Laterality: Left;  with Left Ovarian Cystectomy    MELANOMA EXCISION     with removal of lymph nodes, left shoulder   NOSE SURGERY  2010, 2012   for nose bleeds x 2   PACEMAKER IMPLANT     SINOSCOPY     TONSILLECTOMY AND ADENOIDECTOMY     Family History  Problem Relation Age of Onset   Allergic rhinitis Sister    Stroke Mother    Colon polyps Mother    Colon cancer Mother    Urticaria Mother    Allergic rhinitis Mother    Heart disease Father    Diabetes Father    Kidney disease Father    Diabetes Maternal Grandmother    Clotting disorder Maternal Grandmother        stroke   Crohn's disease Maternal Grandmother    Diabetes Paternal Grandmother    Aneurysm Sister        brain   Social History   Socioeconomic History   Marital status: Married    Spouse name: Not on file   Number of children: 0   Years of education: Not on file   Highest education level: Not on file  Occupational History   Occupation: PRESIDENT    Employer: [employer]    Comment: self employed  Tobacco Use   Smoking status: Never   Smokeless tobacco: Never  Vaping Use   Vaping Use: Never used  Substance and Sexual Activity   Alcohol use: Never    Comment: glass of wine daily   Drug use: No   Sexual  activity: Not on file  Other Topics Concern   Not on file  Social History Narrative   4  years college hh of 2    Neg tad   Social Determinants of Health   Financial Resource Strain: Low Risk    Difficulty of Paying Living Expenses: Not hard at all  Food Insecurity: No Food Insecurity   Worried About Charity fundraiser in the Last Year: Never true   Arboriculturist in the Last Year: Never true  Transportation Needs: No Transportation Needs   Lack of Transportation (Medical): No   Lack of Transportation (Non-Medical): No  Physical Activity: Sufficiently Active   Days of Exercise per Week: 4 days   Minutes of Exercise per Session: 90 min  Stress: No Stress Concern Present   Feeling of Stress : Not at all  Social Connections: Socially Integrated   Frequency of Communication with Friends and Family: More than three times a week   Frequency of Social Gatherings with Friends and Family: More than three times a week   Attends Religious Services: More than 4 times per year   Active Member of [employer] or Organizations: Yes   Attends Music therapist: More than 4 times per year   Marital Status: Married    Clinical Intake: Nutrition Risk Assessment:  Has the patient had any N/V/D within the last 2 months?  No  Does the patient have any non-healing wounds?  No  Has the patient had any unintentional weight loss or weight gain?  No   Diabetes:  Is the patient diabetic?  Yes  If diabetic, was a CBG obtained today?  Yes CBG 96 Taken by patient Did the patient bring in their glucometer from home?  No Audio How often do you monitor your CBG's? Daily.   Financial SVisittrains and Diabetes Management:  Are you having any financial strains with the device, your supplies or your medication? No .  Does the patient want to be seen by Chronic Care Management for management of their diabetes?  No Followed by PCP Would the patient like to be referred to a Nutritionist or for Diabetic Management?  No Followed by PCP  Diabetic Exams:  Diabetic Eye Exam: Completed Yes.    Diabetic Foot Exam: Completed Yes. Pt has been advised about the importance in completing this exam. Pt is scheduled for diabetic foot exam on Followed by PCP.   Pre-visit preparation completed: Yes  Diabetic? Yes   Activities of Daily Living In your present state of health, do you have any difficulty performing the following activities: 03/14/2021 03/09/2021  Hearing? N N  Vision? N N  Difficulty concentrating or making decisions? N N  Comment - -  Walking or climbing stairs? N N  Dressing or bathing? N N  Doing errands, shopping? N N  Preparing Food and eating ? N -  Using the Toilet? N -  In the past six months, have you accidently leaked urine? N -  Do you have problems with loss of bowel control? N -  Managing your Medications? N -  Managing your Finances? N -  Housekeeping or managing your Housekeeping? N -  Some recent data might be hidden    Patient Care Team: Panosh, Standley Brooking, MD as PCP - Lisa Roca, MD as Consulting Physician (Neurosurgery) [name], MD as Consulting Physician (Oncology) [name], MD as Referring Physician (Internal Medicine) Jodi Marble, MD as Consulting Physician (Otolaryngology) Danella Sensing, MD as  Consulting Physician (Dermatology) Viona Gilmore, [facility] as Pharmacist (Pharmacist) Harold Hedge, Darrick Grinder, MD as Consulting Physician (Allergy and Immunology)  Indicate any recent Medical Services you may have received from other than [facility] providers in the past year (date may be approximate).     Assessment:   This is a routine wellness examination for [name] Visit via Telephone Note  I connected with  [name] on 03/14/21 at  1:00 PM EST by telephone and verified that I am speaking with the correct person using two identifiers.  Location: Patient: Home Provider: Office Persons participating in the virtual visit: patient/Nurse Health Advisor   I discussed the limitations, risks, security and  privacy concerns of performing an evaluation and management service by telephone and the availability of in person appointments. The patient expressed understanding and agreed to proceed.  Interactive audio and video telecommunications were attempted between this nurse and patient, however failed, due to patient having technical difficulties OR patient did not have access to video capability.  We continued and completed visit with audio only.  Some vital signs may be absent or patient reported.   Criselda Peaches, LPN   Hearing/Vision screen Hearing Screening - Comments:: No difficulty hearing Vision Screening - Comments:: Wears contact. Followed by [facility] issues and exercise activities discussed: Current Exercise Habits: Home exercise routine, Type of exercise: walking, Time (Minutes): 60, Frequency (Times/Week): 4, Weekly Exercise (Minutes/Week): 240, Intensity: Moderate   Goals Addressed             This Visit's Progress    Patient Stated       Be cancer free.       Depression Screen PHQ 2/9 Scores 03/14/2021 03/22/2020 03/15/2020  PHQ - 2 Score 0 0 0    Fall Risk Fall Risk  03/14/2021 03/22/2020 03/02/2015 05/10/2014 03/17/2014  Falls in the past year? 0 - No No Yes  Comment - - - - -  Number falls in past yr: 0 0 - - 1  Injury with Fall? 0 0 - - No  Comment - - - - -  Risk for fall due to : - Impaired balance/gait;Impaired mobility;Impaired vision - - Impaired balance/gait    FALL RISK PREVENTION PERTAINING TO THE HOME:  Any stairs in or around the home? Yes  If so, are there any without handrails? No  Home free of loose throw rugs in walkways, pet beds, electrical cords, etc? Yes  Adequate lighting in your home to reduce risk of falls? Yes   ASSISTIVE DEVICES UTILIZED TO PREVENT FALLS:  Life alert? No  Use of a cane, walker or w/c? No  Grab bars in the bathroom? No  Shower chair or bench in shower? Yes  Elevated toilet seat or a handicapped toilet?  No   TIMED UP AND GO:  Was the test performed? No . Audio Visit  Cognitive Function:     6CIT Screen 03/14/2021 03/22/2020  What Year? 0 points 0 points  What month? 0 points 0 points  What time? 0 points -  Count back from 20 0 points 0 points  Months in reverse 0 points 0 points  Repeat phrase 0 points 10 points  Total Score 0 -    Immunizations Immunization History  Administered Date(s) Administered   Influenza Split 03/03/2012   PFIZER(Purple Top)SARS-COV-2 Vaccination 05/01/2019   PPD Test 07/01/2013   Pneumococcal Polysaccharide-23 10/03/2012    TDAP status: Due, Education has been provided regarding the  importance of this vaccine. Advised may receive this vaccine at local pharmacy or Health Dept. Aware to provide a copy of the vaccination record if obtained from local pharmacy or Health Dept. Verbalized acceptance and understanding.  Flu Vaccine status: Due, Education has been provided regarding the importance of this vaccine. Advised may receive this vaccine at local pharmacy or Health Dept. Aware to provide a copy of the vaccination record if obtained from local pharmacy or Health Dept. Verbalized acceptance and understanding.  Pneumococcal vaccine status: Due, Education has been provided regarding the importance of this vaccine. Advised may receive this vaccine at local pharmacy or Health Dept. Aware to provide a copy of the vaccination record if obtained from local pharmacy or Health Dept. Verbalized acceptance and understanding.   Qualifies for Shingles Vaccine? Yes   Zostavax completed No   Shingrix Completed?: No.    Education has been provided regarding the importance of this vaccine. Patient has been advised to call insurance company to determine out of pocket expense if they have not yet received this vaccine. Advised may also receive vaccine at local pharmacy or Health Dept. Verbalized acceptance and understanding.  Screening Tests Health Maintenance  Topic  Date Due   PAP SMEAR-Modifier  03/14/2021 (Originally 12/30/1978)   MAMMOGRAM  03/22/2021 (Originally 11/26/2010)   INFLUENZA VACCINE  05/26/2021 (Originally 09/26/2020)   Zoster Vaccines- Shingrix (1 of 2) 06/12/2021 (Originally 12/29/1976)   Pneumococcal Vaccine 59-53 Years old (2 - PCV) 03/14/2022 (Originally 10/03/2013)   TETANUS/TDAP  03/14/2022 (Originally 12/29/1976)   Hepatitis C Screening  03/14/2022 (Originally 12/30/1975)   HIV Screening  03/14/2022 (Originally [birth year]/29/1972)   COLONOSCOPY (Pts 45-13yrs Insurance coverage will need to be confirmed)  12/07/2026   HPV VACCINES  Aged Out   COVID-19 Vaccine  Discontinued    Health Maintenance  There are no preventive care reminders to display for this patient.   Additional Screening:  Hepatitis C Screening: does qualify; Completed Patient deferred  Vision Screening: Recommended annual ophthalmology exams for early detection of glaucoma and other disorders of the eye. Is the patient up to date with their annual eye exam?  Yes  Who is the provider or what is the name of the office in which the patient attends annual eye exams? Followed by [facility]  Dental Screening: Recommended annual dental exams for proper oral hygiene  Community Resource Referral / Chronic Care Management:  CRR required this visit?  No   CCM required this visit?  No      Plan:     I have personally reviewed and noted the following in the patients chart:   Medical and social history Use of alcohol, tobacco or illicit drugs  Current medications and supplements including opioid prescriptions. Patient currently not taking opioids Functional ability and status Nutritional status Physical activity Advanced directives List of other physicians Hospitalizations, surgeries, and ER visits in previous 12 months Vitals Screenings to include cognitive, depression, and falls Referrals and appointments  In addition, I have reviewed and discussed with patient  certain preventive protocols, quality metrics, and best practice recommendations. A written personalized care plan for preventive services as well as general preventive health recommendations were provided to patient.     Criselda Peaches, LPN   05/19/5571   Nurse Notes: FYI: Patient currently has COVID

## 2021-03-14 NOTE — Patient Instructions (Addendum)
[name] , Thank you for taking time to come for your Medicare Wellness Visit. I appreciate your ongoing commitment to your health goals. Please review the following plan we discussed and let me know if I can assist you in the future.   These are the goals we discussed:  Goals      Patient Stated     Be cancer free.        This is a list of the screening recommended for you and due dates:  Health Maintenance  Topic Date Due   Pap Smear  03/14/2021*   Mammogram  03/22/2021*   Flu Shot  05/26/2021*   Zoster (Shingles) Vaccine (1 of 2) 06/12/2021*   Pneumococcal Vaccination (2 - PCV) 03/14/2022*   Tetanus Vaccine  03/14/2022*   Hepatitis C Screening: USPSTF Recommendation to screen - Ages 18-79 yo.  03/14/2022*   HIV Screening  03/14/2022*   Colon Cancer Screening  12/07/2026   HPV Vaccine  Aged Out   COVID-19 Vaccine  Discontinued  *Topic was postponed. The date shown is not the original due date.     Advanced directives: Yes  Conditions/risks identified: None  Next appointment: Follow up in one year for your annual wellness visit   Preventive Care 65 Years and Older, Female Preventive care refers to lifestyle choices and visits with your health care provider that can promote health and wellness. What does preventive care include? A yearly physical exam. This is also called an annual well check. Dental exams once or twice a year. Routine eye exams. Ask your health care provider how often you should have your eyes checked. Personal lifestyle choices, including: Daily care of your teeth and gums. Regular physical activity. Eating a healthy diet. Avoiding tobacco and drug use. Limiting alcohol use. Practicing safe sex. Taking low-dose aspirin every day. Taking vitamin and mineral supplements as recommended by your health care provider. What happens during an annual well check? The services and screenings done by your health care provider during your annual well  check will depend on your age, overall health, lifestyle risk factors, and family history of disease. Counseling  Your health care provider may ask you questions about your: Alcohol use. Tobacco use. Drug use. Emotional well-being. Home and relationship well-being. Sexual activity. Eating habits. History of falls. Memory and ability to understand (cognition). Work and work Statistician. Reproductive health. Screening  You may have the following tests or measurements: Height, weight, and BMI. Blood pressure. Lipid and cholesterol levels. These may be checked every 5 years, or more frequently if you are over 61 years old. Skin check. Lung cancer screening. You may have this screening every year starting at age 23 if you have a 30-pack-year history of smoking and currently smoke or have quit within the past 15 years. Fecal occult blood test (FOBT) of the stool. You may have this test every year starting at age 14. Flexible sigmoidoscopy or colonoscopy. You may have a sigmoidoscopy every 5 years or a colonoscopy every 10 years starting at age 55. Hepatitis C blood test. Hepatitis B blood test. Sexually transmitted disease (STD) testing. Diabetes screening. This is done by checking your blood sugar (glucose) after you have not eaten for a while (fasting). You may have this done every 1-3 years. Bone density scan. This is done to screen for osteoporosis. You may have this done starting at age 66. Mammogram. This may be done every 1-2 years. Talk to your health care provider about how often you should have  regular mammograms. Talk with your health care provider about your test results, treatment options, and if necessary, the need for more tests. Vaccines  Your health care provider may recommend certain vaccines, such as: Influenza vaccine. This is recommended every year. Tetanus, diphtheria, and acellular pertussis (Tdap, Td) vaccine. You may need a Td booster every 10 years. Zoster  vaccine. You may need this after age 21. Pneumococcal 13-valent conjugate (PCV13) vaccine. One dose is recommended after age 61. Pneumococcal polysaccharide (PPSV23) vaccine. One dose is recommended after age 59. Talk to your health care provider about which screenings and vaccines you need and how often you need them. This information is not intended to replace advice given to you by your health care provider. Make sure you discuss any questions you have with your health care provider. Document Released: 03/11/2015 Document Revised: 11/02/2015 Document Reviewed: 12/14/2014 Elsevier Interactive Patient Education  2017 [place] Prevention in the Home Falls can cause injuries. They can happen to people of all ages. There are many things you can do to make your home safe and to help prevent falls. What can I do on the outside of my home? Regularly fix the edges of walkways and driveways and fix any cracks. Remove anything that might make you trip as you walk through a door, such as a raised step or threshold. Trim any bushes or trees on the path to your home. Use bright outdoor lighting. Clear any walking paths of anything that might make someone trip, such as rocks or tools. Regularly check to see if handrails are loose or broken. Make sure that both sides of any steps have handrails. Any raised decks and porches should have guardrails on the edges. Have any leaves, snow, or ice cleared regularly. Use sand or salt on walking paths during winter. Clean up any spills in your garage right away. This includes oil or grease spills. What can I do in the bathroom? Use night lights. Install grab bars by the toilet and in the tub and shower. Do not use towel bars as grab bars. Use non-skid mats or decals in the tub or shower. If you need to sit down in the shower, use a plastic, non-slip stool. Keep the floor dry. Clean up any water that spills on the floor as soon as it happens. Remove  soap buildup in the tub or shower regularly. Attach bath mats securely with double-sided non-slip rug tape. Do not have throw rugs and other things on the floor that can make you trip. What can I do in the bedroom? Use night lights. Make sure that you have a light by your bed that is easy to reach. Do not use any sheets or blankets that are too big for your bed. They should not hang down onto the floor. Have a firm chair that has side arms. You can use this for support while you get dressed. Do not have throw rugs and other things on the floor that can make you trip. What can I do in the kitchen? Clean up any spills right away. Avoid walking on wet floors. Keep items that you use a lot in easy-to-reach places. If you need to reach something above you, use a strong step stool that has a grab bar. Keep electrical cords out of the way. Do not use floor polish or wax that makes floors slippery. If you must use wax, use non-skid floor wax. Do not have throw rugs and other things on the floor that  can make you trip. What can I do with my stairs? Do not leave any items on the stairs. Make sure that there are handrails on both sides of the stairs and use them. Fix handrails that are broken or loose. Make sure that handrails are as long as the stairways. Check any carpeting to make sure that it is firmly attached to the stairs. Fix any carpet that is loose or worn. Avoid having throw rugs at the top or bottom of the stairs. If you do have throw rugs, attach them to the floor with carpet tape. Make sure that you have a light switch at the top of the stairs and the bottom of the stairs. If you do not have them, ask someone to add them for you. What else can I do to help prevent falls? Wear shoes that: Do not have high heels. Have rubber bottoms. Are comfortable and fit you well. Are closed at the toe. Do not wear sandals. If you use a stepladder: Make sure that it is fully opened. Do not climb a  closed stepladder. Make sure that both sides of the stepladder are locked into place. Ask someone to hold it for you, if possible. Clearly mark and make sure that you can see: Any grab bars or handrails. First and last steps. Where the edge of each step is. Use tools that help you move around (mobility aids) if they are needed. These include: Canes. Walkers. Scooters. Crutches. Turn on the lights when you go into a dark area. Replace any light bulbs as soon as they burn out. Set up your furniture so you have a clear path. Avoid moving your furniture around. If any of your floors are uneven, fix them. If there are any pets around you, be aware of where they are. Review your medicines with your doctor. Some medicines can make you feel dizzy. This can increase your chance of falling. Ask your doctor what other things that you can do to help prevent falls. This information is not intended to replace advice given to you by your health care provider. Make sure you discuss any questions you have with your health care provider. Document Released: 12/09/2008 Document Revised: 07/21/2015 Document Reviewed: 03/19/2014 Elsevier Interactive Patient Education  2017 [employer].

## 2021-03-17 NOTE — Progress Notes (Signed)
Anesthesia Chart Review   Case: [id number] Date/Time: 03/27/21 1015   Procedure: XI ROBOTIC ASSISTED HIATAL HERNIA REPAIR   Anesthesia type: General   Pre-op diagnosis: CAMERON ULCERS IN HIATAL HERNIA   Location: [facility] 05 / [facility]   Surgeons: Johnathan Hausen, MD       DISCUSSION:65 y.o. never smoker with h/o PONV, CVA, atrial fibrillation, pacemaker in place (device orders in 01/18/21 progress note), POTS, HTN, cameron ulcers, hiatal hernia scheduled for above procedure 03/27/2021 with Dr. Johnathan Hausen.   COVID positive 03/12/21, no preoperative COVID test indicated.    Last seen by cardiology 05/31/2020.    Pt asked to discuss upcoming anesthesia due to previous complications.  She reports after a previous surgery she experienced shaking while waking up.     S/p lap cholecystectomy 03/28/2018 with no anesthesia complications noted. S/p endoscopy 10/20/2020, no anesthesia complications noted. VS: BP 134/85    Pulse 66    Temp 36.6 C (Oral)    Resp 18    Wt 65.8 kg    SpO2 100%    BMI 25.69 kg/m   PROVIDERS: Panosh, Standley Brooking, MD is PCP    LABS: Labs reviewed: Acceptable for surgery. (all labs ordered are listed, but only abnormal results are displayed)  Labs Reviewed  GLUCOSE, CAPILLARY - Abnormal; Notable for the following components:      Result Value   Glucose-Capillary 103 (*)    All other components within normal limits  HEMOGLOBIN X9J  BASIC METABOLIC PANEL  CBC     IMAGES:   EKG: 04/12/2020 Rate 66 bpm  Atrial pacing  CV: Echo 05/31/2020 1. Left ventricular ejection fraction, by estimation, is 60 to 65%. The  left ventricle has normal function. The left ventricle has no regional  wall motion abnormalities. Left ventricular diastolic parameters are  consistent with Grade I diastolic  dysfunction (impaired relaxation).   2. Right ventricular systolic function is normal. The right ventricular  size is normal.   3. The mitral valve is normal in structure. No  evidence of mitral valve  regurgitation. No evidence of mitral stenosis.   4. The aortic valve is normal in structure. Aortic valve regurgitation is  not visualized. No aortic stenosis is present.   5. The inferior vena cava is normal in size with greater than 50%  respiratory variability, suggesting right atrial pressure of 3 mmHg.  Past Medical History:  Diagnosis Date   Anemia    Angio-edema    Anxiety    Arthritis    Asthma    Atrial fibrillation (HCC)    Atrial tachycardia ([place])    Autonomic dysfunction    Cervicalgia    Complication of anesthesia    pt states wakes up with "shakes"   CVA (cerebral infarction)    2012 with dizziness and vision change felt embolic from atrial tachy   Disorder of bone and cartilage, unspecified    Disturbance of skin sensation    Diverticulosis    DM (diabetes mellitus) ([place])    DVT (deep venous thrombosis) (HCC)    DVT (deep venous thrombosis) ([place])    Eczema    Family history of anesthesia complication    PONV and " shaking "   Fatty liver    GERD (gastroesophageal reflux disease)    History of hiatal hernia    History of pacemaker    History of radiation therapy 10/24/2012   brain   HLD (hyperlipidemia)    HTN (hypertension)    Hypoglycemia,  unspecified    Injury to unspecified nerve of shoulder girdle and upper limb    Liver metastasis (HCC)    Lung metastasis ([place])    Metastasis to brain ([place])    Osteopenia    Other nonspecific abnormal serum enzyme levels    Other specified congenital anomalies of nervous system    Pacemaker    autonomic dysfunction   Pancreatitis    from therapy   Peripheral vascular disease (HCC)    PONV (postoperative nausea and vomiting)    POTS (postural orthostatic tachycardia syndrome)    Recurrent upper respiratory infection (URI)    Skin cancer    Hx: of lung lesion   Stroke ([place])    left weaker    Swelling of limb    Urticaria     Past Surgical History:  Procedure Laterality Date    ABLATION SAPHENOUS VEIN W/ RFA     2002   ADENOIDECTOMY     CARPAL TUNNEL RELEASE Left    CHOLECYSTECTOMY  03/28/2018   CHOLECYSTECTOMY N/A 03/28/2018   Procedure: LAPAROSCOPIC CHOLECYSTECTOMY WITH INTRAOPERATIVE CHOLANGIOGRAM;  Surgeon: Johnathan Hausen, MD;  Location: [facility];  Service: General;  Laterality: N/A;   CRANIOTOMY N/A 09/30/2012   suboccipital craniectomy   CRANIOTOMY Left 02/09/2013   Procedure: LEFT PARIETAL CRANIOTOMY with stealth;  Surgeon: [name], MD;  Location: [facility];  Service: Neurosurgery;  Laterality: Left;  LEFT Parietal Craniotomy for tumor with stealth   DEEP AXILLARY SENTINEL NODE BIOPSY / EXCISION     due to extensive Melanoma-right arm   fatty tumor removed     from chest   INSERT / REPLACE / REMOVE PACEMAKER  2012   1999, x 3   LAPAROSCOPY  09/06/2011   Procedure: LAPAROSCOPY OPERATIVE;  Surgeon: Claiborne Billings A. Pamala Hurry, MD;  Location: [facility];  Service: Gynecology;  Laterality: Left;  with Left Ovarian Cystectomy    MELANOMA EXCISION     with removal of lymph nodes, left shoulder   NOSE SURGERY  2010, 2012   for nose bleeds x 2   PACEMAKER IMPLANT     SINOSCOPY     TONSILLECTOMY AND ADENOIDECTOMY      MEDICATIONS:  acetaminophen (TYLENOL) 500 MG tablet   albuterol (PROVENTIL) (2.5 MG/3ML) 0.083% nebulizer solution   albuterol (VENTOLIN HFA) 108 (90 Base) MCG/ACT inhaler   augmented betamethasone dipropionate (DIPROLENE) 0.05 % ointment   calcium carbonate (TUMS - DOSED IN MG ELEMENTAL CALCIUM) 500 MG chewable tablet   CALCIUM PO   cephALEXin (KEFLEX) 500 MG capsule   cholestyramine (QUESTRAN) 4 g packet   clobetasol cream (TEMOVATE) 0.05 %   clonazePAM (KLONOPIN) 1 MG tablet   cloNIDine (CATAPRES) 0.1 MG tablet   cyanocobalamin (,VITAMIN B-12,) 1000 MCG/ML injection   Desoximetasone (TOPICORT) 0.25 % ointment   diphenhydrAMINE (BENADRYL) 25 MG tablet   EPINEPHrine 0.3 mg/0.3 mL IJ SOAJ injection   fluorouracil (EFUDEX) 5 % cream    fluticasone (FLONASE) 50 MCG/ACT nasal spray   fluticasone (FLOVENT HFA) 44 MCG/ACT inhaler   hyoscyamine (LEVSIN SL) 0.125 MG SL tablet   levETIRAcetam (KEPPRA) 250 MG tablet   levocetirizine (XYZAL) 5 MG tablet   lipase/protease/amylase (CREON) 12000 units CPEP capsule   metoprolol tartrate (LOPRESSOR) 25 MG tablet   nirmatrelvir/ritonavir EUA (PAXLOVID) 20 x 150 MG & 10 x 100MG  TABS   nystatin (MYCOSTATIN) 100000 UNIT/ML suspension   Olopatadine HCl (PATADAY) 0.2 % SOLN   polyethylene glycol (MIRALAX / GLYCOLAX) packet   potassium chloride SA (  K-DUR,KLOR-CON) 20 MEQ tablet   prochlorperazine (COMPAZINE) 10 MG tablet   sodium chloride (OCEAN) 0.65 % SOLN nasal spray   Spacer/Aero-Holding Chambers (OPTICHAMBER DIAMOND) MISC   Syringe/Needle, Disp, (SYRINGE 3CC/27GX1-1/4") 27G X 1-1/4" 3 ML MISC   Vitamin D, Ergocalciferol, (DRISDOL) 50000 units CAPS capsule   No current facility-administered medications for this encounter.     Konrad Felix Ward, PA-C [facility] [phone]

## 2021-03-21 ENCOUNTER — Other Ambulatory Visit: Payer: Self-pay | Admitting: Adult Health

## 2021-03-21 ENCOUNTER — Telehealth: Payer: Medicare Other | Admitting: Physician Assistant

## 2021-03-21 DIAGNOSIS — U071 COVID-19: Secondary | ICD-10-CM

## 2021-03-21 DIAGNOSIS — T50905A Adverse effect of unspecified drugs, medicaments and biological substances, initial encounter: Secondary | ICD-10-CM | POA: Diagnosis not present

## 2021-03-21 DIAGNOSIS — Z79899 Other long term (current) drug therapy: Secondary | ICD-10-CM

## 2021-03-21 DIAGNOSIS — K121 Other forms of stomatitis: Secondary | ICD-10-CM | POA: Diagnosis not present

## 2021-03-21 MED ORDER — NYSTATIN 100000 UNIT/ML MT SUSP: 5.0000 mL | Freq: Four times a day (QID) | OROMUCOSAL | 0 refills | Status: DC

## 2021-03-21 MED ORDER — LIDOCAINE VISCOUS HCL 2 % MT SOLN: OROMUCOSAL | 0 refills | Status: DC

## 2021-03-21 NOTE — Addendum Note (Signed)
Addended by: Mar Daring on: 03/21/2021 02:22 PM   Modules accepted: Orders

## 2021-03-21 NOTE — Progress Notes (Signed)
Virtual Visit Consent   [name], you are scheduled for a virtual visit with a [facility] provider today.     Just as with appointments in the office, your consent must be obtained to participate.  Your consent will be active for this visit and any virtual visit you may have with one of our providers in the next 365 days.     If you have a MyChart account, a copy of this consent can be sent to you electronically.  All virtual visits are billed to your insurance company just like a traditional visit in the office.    As this is a virtual visit, video technology does not allow for your provider to perform a traditional examination.  This may limit your provider's ability to fully assess your condition.  If your provider identifies any concerns that need to be evaluated in person or the need to arrange testing (such as labs, EKG, etc.), we will make arrangements to do so.     Although advances in technology are sophisticated, we cannot ensure that it will always work on either your end or our end.  If the connection with a video visit is poor, the visit may have to be switched to a telephone visit.  With either a video or telephone visit, we are not always able to ensure that we have a secure connection.     I need to obtain your verbal consent now.   Are you willing to proceed with your visit today?    [name] has provided verbal consent on 03/21/2021 for a virtual visit (video or telephone).   Mar Daring, PA-C   Date: 03/21/2021 2:13 PM   Virtual Visit via Video Note   I, Mar Daring, connected with  [name]  ([id number], 1956-01-20) on 03/21/21 at  1:30 PM EST by a video-enabled telemedicine application and verified that I am speaking with the correct person using two identifiers.  Location: Patient: Virtual Visit Location Patient: Home Provider: Virtual Visit Location Provider: Home Office   I discussed the limitations of evaluation and  management by telemedicine and the availability of in person appointments. The patient expressed understanding and agreed to proceed.    History of Present Illness: [name] is a 66 y.o. who identifies as a female who was assigned female at birth, and is being seen today for continued Covid 19 symptoms. She was seen virtually and had tested positive for Covid 19 on 03/12/21. She was started on Paxlovid. Had bad severe reaction of swelling in her mouth and mouth ulcers. Was only able to take for 2 days. Was able to start Nystatin and that has helped. Still having nasal congestion, rhinorrhea, and intense fatigue. Has been walking small stretches and trying to sit in the sun in small spurts (does have H/O metastatic melanoma, so does not stay long).   Also, had questions about an upcoming procedure. She was questioning about having it, but her surgeon, Dr. Hassell Done, decided to postpone again.   Has questions about chronic medications. With her upcoming hiatal hernia surgery, she has to have all liquid medications. She has found most can be compounded but questions some she has been unable to find.    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   S/P laparoscopic cholecystectomy 03/28/2018   Dehydration 05/18/2016   History of pacemaker    POTS (postural orthostatic tachycardia syndrome)    Iron deficiency anemia 11/11/2014   Chronic colitis 08/28/2013   Malignant melanoma, metastatic ([place]) 02/09/2013   Malignant melanoma ([place]) 12/01/2012   Brain metastasis ([place]) 10/16/2012   Hypertension 09/30/2012   Anxiety state, unspecified 09/30/2012   Hypoxemia 09/30/2012   Dysautonomia ([place])  02/25/2011   Anticoagulant long-term use 01/19/2011   Neuritis 01/19/2011   Skin change 01/19/2011   CVA (cerebral infarction)    Palpitation 06/28/2010   Pacemaker-Biotronik 06/28/2010   Dizziness and giddiness 04/27/2010   DIVERTICULOSIS, COLON, HX OF 01/19/2009   CAROTID SINUS SYNDROME 11/17/2008   OTHER MALAISE AND FATIGUE 11/17/2008   HYPERTENSION, BENIGN 11/02/2008   ATRIAL TACHYCARDIA 09/16/2008   SYNCOPE, HX OF 08/09/2008   INJURY UNSPEC NERVE SHOULDER GIRDLE&UPPER LIMB 07/12/2008   PERSONAL HISTORY OF MALIGNANT MELANOMA OF SKIN 07/12/2008   LUQ PAIN 12/08/2007   NECK PAIN 09/26/2007   NUMBNESS, ARM 09/26/2007   CPK, ABNORMAL 09/26/2007   ARM PAIN, LEFT 06/25/2007   SWELLING OF LIMB 06/25/2007   OSTEOPENIA 06/25/2007    Allergies:  Allergies  Allergen Reactions   Bee Venom Swelling   Doxycycline Hives   Erythromycin Hives   Gadavist [Gadobutrol] Hives, Shortness Of Breath and Itching   Paxlovid [Nirmatrelvir-Ritonavir] Other (See Comments)    Mouth swelling, mouth ulcers   Penicillins Hives    Did it involve swelling of the face/tongue/throat, SOB, or low BP? No Did it involve sudden or severe rash/hives, skin peeling, or any reaction on the inside of your mouth or nose? Yes Did you need to seek medical attention at a hospital or doctor's office? Yes When did it last happen?      5+ years If all above answers are NO, may proceed with cephalosporin use.    Sulfa Antibiotics Hives   Preparation H [Pramox-Pe-Glycerin-Petrolatum] Hives    Cannot use  Cream or Suppositories    Phenergan [Promethazine Hcl] Other (See Comments)    Causes restless leg syndrome   Medications:  Current Outpatient Medications:    magic mouthwash (nystatin, hydrocortisone, diphenhydrAMINE) suspension, Swish and swallow 5 mLs 4 (four) times daily., Disp: 540 mL, Rfl: 0   acetaminophen (TYLENOL) 500 MG tablet, Take 500 mg by mouth every 6 (six) hours as needed for moderate pain., Disp: ,  Rfl:    albuterol (PROVENTIL) (2.5 MG/3ML) 0.083% nebulizer solution, Take 3 mLs (2.5 mg total) by nebulization every 4 (four) hours as needed for wheezing or shortness of breath., Disp: 180 mL, Rfl: 1   albuterol (VENTOLIN HFA) 108 (90 Base) MCG/ACT inhaler, INHALE 2 PUFFS INTO THE LUNGS EVERY 4 (FOUR) HOURS AS NEEDED FOR WHEEZING OR SHORTNESS OF BREATH., Disp: 18 Inhaler, Rfl: 0   augmented betamethasone dipropionate (DIPROLENE) 0.05 % ointment, Apply topically 2 (two) times daily. Limit to no longer than 2 weeks use. Not on face., Disp: 50 g, Rfl: 0   calcium carbonate (TUMS - DOSED IN MG ELEMENTAL CALCIUM) 500 MG chewable tablet, Chew 2 tablets by mouth daily as needed for indigestion or heartburn., Disp: , Rfl:    CALCIUM PO, Take 1 tablet by mouth daily., Disp: , Rfl:    cephALEXin (KEFLEX) 500 MG capsule, Take 1 capsule (500 mg total) by mouth 4 (four) times daily. For relapsing UTI, Disp: 28 capsule, Rfl: 0  cholestyramine (QUESTRAN) 4 g packet, Take 4 g by mouth 2 (two) times daily., Disp: , Rfl:    clobetasol cream (TEMOVATE) 0.73 %, Apply 1 application topically 2 (two) times daily as needed (rash)., Disp: , Rfl:    clonazePAM (KLONOPIN) 1 MG tablet, TAKE 1 TABLET BY MOUTH TWICE A DAY MAY TAKE 3X PER DAY IF NEEDED FOR EXTRA ANXIETY, Disp: 70 tablet, Rfl: 0   cloNIDine (CATAPRES) 0.1 MG tablet, Take 1 tablet (0.1 mg total) by mouth at bedtime., Disp: 90 tablet, Rfl: 3   cyanocobalamin (,VITAMIN B-12,) 1000 MCG/ML injection, Inject 1 mL (1,000 mcg total) into the muscle every 30 (thirty) days., Disp: 1 mL, Rfl: 12   Desoximetasone (TOPICORT) 0.25 % ointment, Apply 1 application topically 2 (two) times daily. (Patient taking differently: Apply 1 application topically 2 (two) times daily as needed (rash).), Disp: 30 g, Rfl: 1   diphenhydrAMINE (BENADRYL) 25 MG tablet, Take 25 mg by mouth daily as needed for allergies., Disp: , Rfl:    EPINEPHrine 0.3 mg/0.3 mL IJ SOAJ injection, Inject 0.3 mg  into the muscle as needed for anaphylaxis., Disp: , Rfl:    fluorouracil (EFUDEX) 5 % cream, Apply 1 application topically 2 (two) times daily as needed (rash)., Disp: , Rfl:    fluticasone (FLONASE) 50 MCG/ACT nasal spray, Place 2 sprays into both nostrils daily., Disp: 16 g, Rfl: 0   fluticasone (FLOVENT HFA) 44 MCG/ACT inhaler, Inhale 2 puffs into the lungs 2 (two) times daily as needed (shortness of breath)., Disp: , Rfl:    hyoscyamine (LEVSIN SL) 0.125 MG SL tablet, Place 0.125 mg under the tongue every 6 (six) hours as needed for cramping., Disp: , Rfl:    levETIRAcetam (KEPPRA) 250 MG tablet, Take 250 mg by mouth See admin instructions. Take 250mg  three times a day at 6am, 12pm, 6pm., Disp: , Rfl:    levocetirizine (XYZAL) 5 MG tablet, TAKE 1 TABLET BY MOUTH EVERY DAY AS NEEDED (Patient taking differently: Take 5 mg by mouth daily as needed for allergies.), Disp: 30 tablet, Rfl: 0   lipase/protease/amylase (CREON) 12000 units CPEP capsule, Take 12,000 Units by mouth daily with breakfast., Disp: , Rfl:    metoprolol tartrate (LOPRESSOR) 25 MG tablet, Take 1 tablet (25 mg total) by mouth 2 (two) times daily., Disp: 180 tablet, Rfl: 3   nystatin (MYCOSTATIN) 100000 UNIT/ML suspension, Use as directed 5 mLs (500,000 Units total) in the mouth or throat 3 (three) times daily as needed (thrush)., Disp: 60 mL, Rfl: 2   Olopatadine HCl (PATADAY) 0.2 % SOLN, Use one drop in each eye once daily as needed. (Patient taking differently: Place 1 drop into both eyes daily as needed (itchy eyes).), Disp: 2.5 mL, Rfl: 5   polyethylene glycol (MIRALAX / GLYCOLAX) packet, Take 17 g by mouth at bedtime., Disp: , Rfl:    potassium chloride SA (K-DUR,KLOR-CON) 20 MEQ tablet, Take 1 tablet (20 mEq total) by mouth 2 (two) times daily., Disp: 60 tablet, Rfl: 3   prochlorperazine (COMPAZINE) 10 MG tablet, Take 1 tablet (10 mg total) by mouth every 6 (six) hours as needed for nausea or vomiting., Disp: 60 tablet, Rfl: 0    sodium chloride (OCEAN) 0.65 % SOLN nasal spray, Place 1 spray into both nostrils as needed for congestion. , Disp: , Rfl:    Spacer/Aero-Holding Chambers ([place]) MISC, See admin instructions., Disp: , Rfl:    Syringe/Needle, Disp, (SYRINGE 3CC/27GX1-1/4") 27G X 1-1/4" 3 ML MISC, Inject 1 mL  into the muscle every 30 (thirty) days. Vit B12, Disp: 12 each, Rfl: 0   Vitamin D, Ergocalciferol, (DRISDOL) 50000 units CAPS capsule, TAKE 1 CAPSULE (50,000 UNITS TOTAL) BY MOUTH EVERY 7 (SEVEN) DAYS., Disp: 4 capsule, Rfl: 11  Observations/Objective: Patient is well-developed, well-nourished in no acute distress.  Resting comfortably at home.  Head is normocephalic, atraumatic.  No labored breathing.  Speech is clear and coherent with logical content.  Patient is alert and oriented at baseline.    Assessment and Plan: 1. Mouth ulcer - magic mouthwash (nystatin, hydrocortisone, diphenhydrAMINE) suspension; Swish and swallow 5 mLs 4 (four) times daily.  Dispense: 540 mL; Refill: 0  2. Adverse effect of drug, initial encounter  3. COVID-19  4. Medication management  - Continue supportive care for covid 19 - Magic mouthwash for mucositis type allergic reaction following Paxlovid - Surgery has already been postponed by surgeon; continue immune support (Vitamin C, Zinc, Vitamin D) - Discussed steam therapy with echinacea essential oil for nasal congestion and drainage - Discussed contacting her PCP, Dr. Regis Bill, to discuss medication needs prior to surgery; If PCP office has access to clinical pharmacist, may benefit patient on finding liquid medications or adjustments for her following surgery.  Follow Up Instructions: I discussed the assessment and treatment plan with the patient. The patient was provided an opportunity to ask questions and all were answered. The patient agreed with the plan and demonstrated an understanding of the instructions.  A copy of instructions were sent to the  patient via MyChart unless otherwise noted below.   The patient was advised to call back or seek an in-person evaluation if the symptoms worsen or if the condition fails to improve as anticipated.  Time:  I spent 40 minutes with the patient via telehealth technology discussing the above problems/concerns.    Mar Daring, PA-C

## 2021-03-21 NOTE — Patient Instructions (Signed)
[name], thank you for joining Mar Daring, PA-C for today's virtual visit.  While this provider is not your primary care provider (PCP), if your PCP is located in our provider database this encounter information will be shared with them immediately following your visit.  Consent: (Patient) [name] provided verbal consent for this virtual visit at the beginning of the encounter.  Current Medications:  Current Outpatient Medications:    magic mouthwash (nystatin, hydrocortisone, diphenhydrAMINE) suspension, Swish and swallow 5 mLs 4 (four) times daily., Disp: 540 mL, Rfl: 0   acetaminophen (TYLENOL) 500 MG tablet, Take 500 mg by mouth every 6 (six) hours as needed for moderate pain., Disp: , Rfl:    albuterol (PROVENTIL) (2.5 MG/3ML) 0.083% nebulizer solution, Take 3 mLs (2.5 mg total) by nebulization every 4 (four) hours as needed for wheezing or shortness of breath., Disp: 180 mL, Rfl: 1   albuterol (VENTOLIN HFA) 108 (90 Base) MCG/ACT inhaler, INHALE 2 PUFFS INTO THE LUNGS EVERY 4 (FOUR) HOURS AS NEEDED FOR WHEEZING OR SHORTNESS OF BREATH., Disp: 18 Inhaler, Rfl: 0   augmented betamethasone dipropionate (DIPROLENE) 0.05 % ointment, Apply topically 2 (two) times daily. Limit to no longer than 2 weeks use. Not on face., Disp: 50 g, Rfl: 0   calcium carbonate (TUMS - DOSED IN MG ELEMENTAL CALCIUM) 500 MG chewable tablet, Chew 2 tablets by mouth daily as needed for indigestion or heartburn., Disp: , Rfl:    CALCIUM PO, Take 1 tablet by mouth daily., Disp: , Rfl:    cephALEXin (KEFLEX) 500 MG capsule, Take 1 capsule (500 mg total) by mouth 4 (four) times daily. For relapsing UTI, Disp: 28 capsule, Rfl: 0   cholestyramine (QUESTRAN) 4 g packet, Take 4 g by mouth 2 (two) times daily., Disp: , Rfl:    clobetasol cream (TEMOVATE) 2.95 %, Apply 1 application topically 2 (two) times daily as needed (rash)., Disp: , Rfl:    clonazePAM (KLONOPIN) 1 MG tablet, TAKE 1 TABLET BY MOUTH  TWICE A DAY MAY TAKE 3X PER DAY IF NEEDED FOR EXTRA ANXIETY, Disp: 70 tablet, Rfl: 0   cloNIDine (CATAPRES) 0.1 MG tablet, Take 1 tablet (0.1 mg total) by mouth at bedtime., Disp: 90 tablet, Rfl: 3   cyanocobalamin (,VITAMIN B-12,) 1000 MCG/ML injection, Inject 1 mL (1,000 mcg total) into the muscle every 30 (thirty) days., Disp: 1 mL, Rfl: 12   Desoximetasone (TOPICORT) 0.25 % ointment, Apply 1 application topically 2 (two) times daily. (Patient taking differently: Apply 1 application topically 2 (two) times daily as needed (rash).), Disp: 30 g, Rfl: 1   diphenhydrAMINE (BENADRYL) 25 MG tablet, Take 25 mg by mouth daily as needed for allergies., Disp: , Rfl:    EPINEPHrine 0.3 mg/0.3 mL IJ SOAJ injection, Inject 0.3 mg into the muscle as needed for anaphylaxis., Disp: , Rfl:    fluorouracil (EFUDEX) 5 % cream, Apply 1 application topically 2 (two) times daily as needed (rash)., Disp: , Rfl:    fluticasone (FLONASE) 50 MCG/ACT nasal spray, Place 2 sprays into both nostrils daily., Disp: 16 g, Rfl: 0   fluticasone (FLOVENT HFA) 44 MCG/ACT inhaler, Inhale 2 puffs into the lungs 2 (two) times daily as needed (shortness of breath)., Disp: , Rfl:    hyoscyamine (LEVSIN SL) 0.125 MG SL tablet, Place 0.125 mg under the tongue every 6 (six) hours as needed for cramping., Disp: , Rfl:    levETIRAcetam (KEPPRA) 250 MG tablet, Take 250 mg by mouth See admin  instructions. Take 250mg  three times a day at 6am, 12pm, 6pm., Disp: , Rfl:    levocetirizine (XYZAL) 5 MG tablet, TAKE 1 TABLET BY MOUTH EVERY DAY AS NEEDED (Patient taking differently: Take 5 mg by mouth daily as needed for allergies.), Disp: 30 tablet, Rfl: 0   lipase/protease/amylase (CREON) 12000 units CPEP capsule, Take 12,000 Units by mouth daily with breakfast., Disp: , Rfl:    metoprolol tartrate (LOPRESSOR) 25 MG tablet, Take 1 tablet (25 mg total) by mouth 2 (two) times daily., Disp: 180 tablet, Rfl: 3   nystatin (MYCOSTATIN) 100000 UNIT/ML  suspension, Use as directed 5 mLs (500,000 Units total) in the mouth or throat 3 (three) times daily as needed (thrush)., Disp: 60 mL, Rfl: 2   Olopatadine HCl (PATADAY) 0.2 % SOLN, Use one drop in each eye once daily as needed. (Patient taking differently: Place 1 drop into both eyes daily as needed (itchy eyes).), Disp: 2.5 mL, Rfl: 5   polyethylene glycol (MIRALAX / GLYCOLAX) packet, Take 17 g by mouth at bedtime., Disp: , Rfl:    potassium chloride SA (K-DUR,KLOR-CON) 20 MEQ tablet, Take 1 tablet (20 mEq total) by mouth 2 (two) times daily., Disp: 60 tablet, Rfl: 3   prochlorperazine (COMPAZINE) 10 MG tablet, Take 1 tablet (10 mg total) by mouth every 6 (six) hours as needed for nausea or vomiting., Disp: 60 tablet, Rfl: 0   sodium chloride (OCEAN) 0.65 % SOLN nasal spray, Place 1 spray into both nostrils as needed for congestion. , Disp: , Rfl:    Spacer/Aero-Holding Chambers ([place]) MISC, See admin instructions., Disp: , Rfl:    Syringe/Needle, Disp, (SYRINGE 3CC/27GX1-1/4") 27G X 1-1/4" 3 ML MISC, Inject 1 mL into the muscle every 30 (thirty) days. Vit B12, Disp: 12 each, Rfl: 0   Vitamin D, Ergocalciferol, (DRISDOL) 50000 units CAPS capsule, TAKE 1 CAPSULE (50,000 UNITS TOTAL) BY MOUTH EVERY 7 (SEVEN) DAYS., Disp: 4 capsule, Rfl: 11   Medications ordered in this encounter:  Meds ordered this encounter  Medications   magic mouthwash (nystatin, hydrocortisone, diphenhydrAMINE) suspension    Sig: Swish and swallow 5 mLs 4 (four) times daily.    Dispense:  540 mL    Refill:  0    May use pharmacy recipe    Order Specific Question:   Supervising Provider    Answer:   Noemi Chapel [[id number]]     *If you need refills on other medications prior to your next appointment, please contact your pharmacy*  Follow-Up: Call back or seek an in-person evaluation if the symptoms worsen or if the condition fails to improve as anticipated.  Other Instructions Can take to support immune  system: Vit C 500mg  twice daily Zinc 75-100mg  daily Melatonin 3-6 mg at bedtime Vit D3 1000-2000 IU daily Echinacea oral supplement or essential oil in steam Also can add tylenol/ibuprofen as needed for fevers and body aches May add Mucinex or Mucinex DM as needed for cough/congestion   If you have been instructed to have an in-person evaluation today at a local Urgent Care facility, please use the link below. It will take you to a list of all of our available [facility], including address, phone number and hours of operation. Please do not delay care.  [facility]  If you or a family member do not have a primary care provider, use the link below to schedule a visit and establish care. When you choose a [facility] primary care physician or  advanced practice provider, you gain a long-term partner in health. Find a Primary Care Provider  Learn more about [facility]'s in-office and virtual care options: [facility]

## 2021-03-23 ENCOUNTER — Encounter: Payer: Self-pay | Admitting: Internal Medicine

## 2021-03-23 ENCOUNTER — Encounter (HOSPITAL_COMMUNITY): Payer: Medicare Other

## 2021-03-23 ENCOUNTER — Telehealth (INDEPENDENT_AMBULATORY_CARE_PROVIDER_SITE_OTHER): Payer: Medicare Other | Admitting: Internal Medicine

## 2021-03-23 VITALS — HR 92 | Temp 97.3°F | Wt 141.0 lb

## 2021-03-23 DIAGNOSIS — T50905A Adverse effect of unspecified drugs, medicaments and biological substances, initial encounter: Secondary | ICD-10-CM

## 2021-03-23 DIAGNOSIS — U071 COVID-19: Secondary | ICD-10-CM | POA: Diagnosis not present

## 2021-03-23 DIAGNOSIS — Z789 Other specified health status: Secondary | ICD-10-CM

## 2021-03-23 DIAGNOSIS — Z79899 Other long term (current) drug therapy: Secondary | ICD-10-CM

## 2021-03-23 NOTE — Progress Notes (Signed)
Virtual Visit via Video Note  I connected with [name] on 03/23/21 at  9:30 AM EST by a video enabled telemedicine application and verified that I am speaking with the correct person using two identifiers. Location patient: home Location provider:home office Persons participating in the virtual visit: patient, provider  WIth national recommendations  regarding COVID 19 pandemic   video visit is advised over in office visit for this patient.  Patient aware  of the limitations of evaluation and management by telemedicine and  availability of in person appointments. and agreed to proceed.   HPI: [name] presents for video visit tested positive for COVID January 14 w myalgias fever in the 101 range and respiratory symptoms., the day after her husband developed it.  Visit video was prescribed Paxlovid my nurse practitioner began medicine and within 3 days had blisters all over her mouth and throat that made it difficult to swallow keep fluids down.  She contacted an acquaintance who is a Designer, jewellery who had her take Benadryl gave her IV fluids zinc and vitamin D honey salt water gargles etc. and eventually has gotten better today is the first day she is talking normally but still has some respiratory congestion.  And fatigue but no fever.  She was due for her surgical procedure on the 30th but that will need to be delayed until her respiratory symptoms are resolved.  It is felt that her symptoms in her throat were related to a reaction to the pack Slo-Bid. She needs a refill of the clonazepam she has been on this for many years takes 2-3 a day see previous notes .  I have been  recently taking over prescribing at request.  Had video visit Jan 24 with a pa burnette PA  ROS: See pertinent positives and negatives per HPI.  Past Medical History:  Diagnosis Date   Anemia    Angio-edema    Anxiety    Arthritis    Asthma    Atrial fibrillation (HCC)    Atrial tachycardia  ([place])    Autonomic dysfunction    Cervicalgia    Complication of anesthesia    pt states wakes up with "shakes"   CVA (cerebral infarction)    2012 with dizziness and vision change felt embolic from atrial tachy   Disorder of bone and cartilage, unspecified    Disturbance of skin sensation    Diverticulosis    DM (diabetes mellitus) ([place])    DVT (deep venous thrombosis) (HCC)    DVT (deep venous thrombosis) ([place])    Eczema    Family history of anesthesia complication    PONV and " shaking "   Fatty liver    GERD (gastroesophageal reflux disease)    History of hiatal hernia    History of pacemaker    History of radiation therapy 10/24/2012   brain   HLD (hyperlipidemia)    HTN (hypertension)    Hypoglycemia, unspecified    Injury to unspecified nerve of shoulder girdle and upper limb    Liver metastasis (HCC)    Lung metastasis (HCC)    Metastasis to brain (HCC)    Osteopenia    Other nonspecific abnormal serum enzyme levels    Other specified congenital anomalies of nervous system    Pacemaker    autonomic dysfunction   Pancreatitis    from therapy   Peripheral vascular disease (HCC)    PONV (postoperative nausea and vomiting)    POTS (postural orthostatic tachycardia  syndrome)    Recurrent upper respiratory infection (URI)    Skin cancer    Hx: of lung lesion   Stroke ([place])    left weaker    Swelling of limb    Urticaria     Past Surgical History:  Procedure Laterality Date   ABLATION SAPHENOUS VEIN W/ RFA     2002   ADENOIDECTOMY     CARPAL TUNNEL RELEASE Left    CHOLECYSTECTOMY  03/28/2018   CHOLECYSTECTOMY N/A 03/28/2018   Procedure: LAPAROSCOPIC CHOLECYSTECTOMY WITH INTRAOPERATIVE CHOLANGIOGRAM;  Surgeon: Johnathan Hausen, MD;  Location: [facility];  Service: General;  Laterality: N/A;   CRANIOTOMY N/A 09/30/2012   suboccipital craniectomy   CRANIOTOMY Left 02/09/2013   Procedure: LEFT PARIETAL CRANIOTOMY with stealth;  Surgeon: [name], MD;  Location: [facility];  Service: Neurosurgery;  Laterality: Left;  LEFT Parietal Craniotomy for tumor with stealth   DEEP AXILLARY SENTINEL NODE BIOPSY / EXCISION     due to extensive Melanoma-right arm   fatty tumor removed     from chest   INSERT / REPLACE / REMOVE PACEMAKER  2012   1999, x 3   LAPAROSCOPY  09/06/2011   Procedure: LAPAROSCOPY OPERATIVE;  Surgeon: Claiborne Billings A. Pamala Hurry, MD;  Location: [facility];  Service: Gynecology;  Laterality: Left;  with Left Ovarian Cystectomy    MELANOMA EXCISION     with removal of lymph nodes, left shoulder   NOSE SURGERY  2010, 2012   for nose bleeds x 2   PACEMAKER IMPLANT     SINOSCOPY     TONSILLECTOMY AND ADENOIDECTOMY      Family History  Problem Relation Age of Onset   Allergic rhinitis Sister    Stroke Mother    Colon polyps Mother    Colon cancer Mother    Urticaria Mother    Allergic rhinitis Mother    Heart disease Father    Diabetes Father    Kidney disease Father    Diabetes Maternal Grandmother    Clotting disorder Maternal Grandmother        stroke   Crohn's disease Maternal Grandmother    Diabetes Paternal Grandmother    Aneurysm Sister        brain    Social History   Tobacco Use   Smoking status: Never   Smokeless tobacco: Never  Vaping Use   Vaping Use: Never used  Substance Use Topics   Alcohol use: Never    Comment: glass of wine daily   Drug use: No      Current Outpatient Medications:    acetaminophen (TYLENOL) 500 MG tablet, Take 500 mg by mouth every 6 (six) hours as needed for moderate pain., Disp: , Rfl:    albuterol (PROVENTIL) (2.5 MG/3ML) 0.083% nebulizer solution, Take 3 mLs (2.5 mg total) by nebulization every 4 (four) hours as needed for wheezing or shortness of breath., Disp: 180 mL, Rfl: 1   albuterol (VENTOLIN HFA) 108 (90 Base) MCG/ACT inhaler, INHALE 2 PUFFS INTO THE LUNGS EVERY 4 (FOUR) HOURS AS NEEDED FOR WHEEZING OR SHORTNESS OF BREATH., Disp: 18 Inhaler, Rfl: 0   augmented betamethasone  dipropionate (DIPROLENE) 0.05 % ointment, Apply topically 2 (two) times daily. Limit to no longer than 2 weeks use. Not on face., Disp: 50 g, Rfl: 0   calcium carbonate (TUMS - DOSED IN MG ELEMENTAL CALCIUM) 500 MG chewable tablet, Chew 2 tablets by mouth daily as needed for indigestion or heartburn., Disp: , Rfl:  CALCIUM PO, Take 1 tablet by mouth daily., Disp: , Rfl:    cephALEXin (KEFLEX) 500 MG capsule, Take 1 capsule (500 mg total) by mouth 4 (four) times daily. For relapsing UTI, Disp: 28 capsule, Rfl: 0   cholestyramine (QUESTRAN) 4 g packet, Take 4 g by mouth 2 (two) times daily., Disp: , Rfl:    clobetasol cream (TEMOVATE) 2.95 %, Apply 1 application topically 2 (two) times daily as needed (rash)., Disp: , Rfl:    cloNIDine (CATAPRES) 0.1 MG tablet, Take 1 tablet (0.1 mg total) by mouth at bedtime., Disp: 90 tablet, Rfl: 3   cyanocobalamin (,VITAMIN B-12,) 1000 MCG/ML injection, Inject 1 mL (1,000 mcg total) into the muscle every 30 (thirty) days., Disp: 1 mL, Rfl: 12   Desoximetasone (TOPICORT) 0.25 % ointment, Apply 1 application topically 2 (two) times daily. (Patient taking differently: Apply 1 application topically 2 (two) times daily as needed (rash).), Disp: 30 g, Rfl: 1   diphenhydrAMINE (BENADRYL) 25 MG tablet, Take 25 mg by mouth daily as needed for allergies., Disp: , Rfl:    EPINEPHrine 0.3 mg/0.3 mL IJ SOAJ injection, Inject 0.3 mg into the muscle as needed for anaphylaxis., Disp: , Rfl:    fluorouracil (EFUDEX) 5 % cream, Apply 1 application topically 2 (two) times daily as needed (rash)., Disp: , Rfl:    fluticasone (FLONASE) 50 MCG/ACT nasal spray, Place 2 sprays into both nostrils daily., Disp: 16 g, Rfl: 0   fluticasone (FLOVENT HFA) 44 MCG/ACT inhaler, Inhale 2 puffs into the lungs 2 (two) times daily as needed (shortness of breath)., Disp: , Rfl:    hyoscyamine (LEVSIN SL) 0.125 MG SL tablet, Place 0.125 mg under the tongue every 6 (six) hours as needed for cramping.,  Disp: , Rfl:    levETIRAcetam (KEPPRA) 250 MG tablet, Take 250 mg by mouth See admin instructions. Take 250mg  three times a day at 6am, 12pm, 6pm., Disp: , Rfl:    levocetirizine (XYZAL) 5 MG tablet, TAKE 1 TABLET BY MOUTH EVERY DAY AS NEEDED (Patient taking differently: Take 5 mg by mouth daily as needed for allergies.), Disp: 30 tablet, Rfl: 0   lidocaine (XYLOCAINE) 2 % solution, 16mL swish and swallow as needed for sore throat; take with nystatin susp, Disp: 200 mL, Rfl: 0   lipase/protease/amylase (CREON) 12000 units CPEP capsule, Take 12,000 Units by mouth daily with breakfast., Disp: , Rfl:    metoprolol tartrate (LOPRESSOR) 25 MG tablet, Take 1 tablet (25 mg total) by mouth 2 (two) times daily., Disp: 180 tablet, Rfl: 3   nystatin (MYCOSTATIN) 100000 UNIT/ML suspension, Use as directed 5 mLs (500,000 Units total) in the mouth or throat 3 (three) times daily as needed (thrush)., Disp: 60 mL, Rfl: 2   Olopatadine HCl (PATADAY) 0.2 % SOLN, Use one drop in each eye once daily as needed. (Patient taking differently: Place 1 drop into both eyes daily as needed (itchy eyes).), Disp: 2.5 mL, Rfl: 5   polyethylene glycol (MIRALAX / GLYCOLAX) packet, Take 17 g by mouth at bedtime., Disp: , Rfl:    potassium chloride SA (K-DUR,KLOR-CON) 20 MEQ tablet, Take 1 tablet (20 mEq total) by mouth 2 (two) times daily., Disp: 60 tablet, Rfl: 3   prochlorperazine (COMPAZINE) 10 MG tablet, Take 1 tablet (10 mg total) by mouth every 6 (six) hours as needed for nausea or vomiting., Disp: 60 tablet, Rfl: 0   sodium chloride (OCEAN) 0.65 % SOLN nasal spray, Place 1 spray into both nostrils as needed for  congestion. , Disp: , Rfl:    Spacer/Aero-Holding Chambers (Phillips) MISC, See admin instructions., Disp: , Rfl:    Syringe/Needle, Disp, (SYRINGE 3CC/27GX1-1/4") 27G X 1-1/4" 3 ML MISC, Inject 1 mL into the muscle every 30 (thirty) days. Vit B12, Disp: 12 each, Rfl: 0   Vitamin D, Ergocalciferol, (DRISDOL)  50000 units CAPS capsule, TAKE 1 CAPSULE (50,000 UNITS TOTAL) BY MOUTH EVERY 7 (SEVEN) DAYS., Disp: 4 capsule, Rfl: 11   clonazePAM (KLONOPIN) 1 MG tablet, TAKE 1 TABLET BY MOUTH TWICE A DAY. MAY TAKE UP TO 3 TIMES DAILY IF NEEDED FOR EXTRA ANXIETY, Disp: 70 tablet, Rfl: 0  EXAM: BP Readings from Last 3 Encounters:  03/09/21 134/85  01/23/21 (!) 123/93  12/26/20 130/80    VITALS per patient if applicable:  GENERAL: alert, oriented, appears well and in no acute distress nontoxic no cough modestly congested no facial edema noted.  HEENT: atraumatic, conjunttiva clear, no obvious abnormalities on inspection of external nose and ears  NECK: normal movements of the head and neck  LUNGS: on inspection no signs of respiratory distress, breathing rate appears normal, no obvious gross SOB, gasping or wheezing  CV: no obvious cyanosis  MS: moves all visible extremities without noticeable abnormality  PSYCH/NEURO: pleasant and cooperative, no obvious depression or anxiety, speech and thought processing grossly intact Lab Results  Component Value Date   WBC 6.6 03/09/2021   HGB 14.1 03/09/2021   HCT 43.1 03/09/2021   PLT 291 03/09/2021   GLUCOSE 95 03/09/2021   CHOL 145 02/09/2014   TRIG 123.0 02/09/2014   HDL 47.90 02/09/2014   LDLDIRECT 109.7 10/11/2010   LDLCALC 73 02/09/2014   ALT 27 08/09/2020   AST 27 08/09/2020   NA 137 03/09/2021   K 4.4 03/09/2021   CL 104 03/09/2021   CREATININE 0.81 03/09/2021   BUN 16 03/09/2021   CO2 24 03/09/2021   TSH 1.146 12/17/2013   INR 1.03 08/28/2013   HGBA1C 5.6 03/09/2021    ASSESSMENT AND PLAN:  Discussed the following assessment and plan:    ICD-10-CM   1. COVID-19  U07.1    currnetly uncomplicated     2. Adverse effect of drug, initial encounter  T50.905A     3. Medication management  Z79.899     4. Medically complex patient  Z78.9      Fortunately improving.  Expect persistent fatigue but congestion should be resolved  before a surgical procedure. No other complications are obvious except for the most likely side effect of the antiviral medication.  Counseled.   Expectant management and discussion of plan and treatment with opportunity to ask questions and all were answered. The patient agreed with the plan and demonstrated an understanding of the instructions.   Advised to call back or seek an in-person evaluation if worsening  or having  further concerns  in interim.  Record review visit counseling 34 minutes.regarding covid and se of med and upcoming surgery plans  Return if symptoms worsen or fail to improve as expected, for when planned.    [name], MD

## 2021-03-27 DIAGNOSIS — E119 Type 2 diabetes mellitus without complications: Secondary | ICD-10-CM

## 2021-03-28 ENCOUNTER — Ambulatory Visit: Payer: Medicare Other

## 2021-04-10 ENCOUNTER — Other Ambulatory Visit: Payer: Self-pay | Admitting: Internal Medicine

## 2021-04-10 DIAGNOSIS — N6320 Unspecified lump in the left breast, unspecified quadrant: Secondary | ICD-10-CM

## 2021-04-19 ENCOUNTER — Encounter (HOSPITAL_COMMUNITY): Payer: Self-pay

## 2021-04-19 ENCOUNTER — Encounter: Payer: Self-pay | Admitting: Internal Medicine

## 2021-04-19 DIAGNOSIS — N6323 Unspecified lump in the left breast, lower outer quadrant: Secondary | ICD-10-CM | POA: Diagnosis not present

## 2021-04-19 DIAGNOSIS — C439 Malignant melanoma of skin, unspecified: Secondary | ICD-10-CM | POA: Diagnosis not present

## 2021-04-19 DIAGNOSIS — N6320 Unspecified lump in the left breast, unspecified quadrant: Secondary | ICD-10-CM | POA: Diagnosis not present

## 2021-04-19 NOTE — Progress Notes (Signed)
[facility] DEVICE PROGRAMMING  Patient Information: Name:  [name]  DOB:  Jun 25, 1955  MRN:  [id number]    device orders Received: Today Kristian Covey, RN  P [facility] Device Planned Procedure:Repair of hiatal hernia with possible fundoplication  Surgeon:Matthew martin  Date of Procedure:05-12-21  Cautery will be used.N/A  Position during surgery: N/A   Please send documentation back to:  [facility] (Fax # [phone])   Kristian Covey, RN  04/19/2021 3:27 PM  Device Information:  Clinic EP Physician:  Virl Axe, MD   Device Type:  Pacemaker Manufacturer and Phone #:  [employer]: [phone] Pacemaker Dependent?:  No. Date of Last Device Check:  05/31/20 Normal Device Function?:  Yes.    Electrophysiologist's Recommendations:  Have magnet available. Provide continuous ECG monitoring when magnet is used or reprogramming is to be performed.  Procedure may interfere with device function.  Magnet should be placed over device during procedure.  Per Device Clinic [address], [place] Ram, RN  4:34 PM 04/19/2021

## 2021-04-19 NOTE — Progress Notes (Signed)
PCP -  Cardiologist -   PPM/ICD -  Device Orders -  Rep Notified -   Chest x-ray -  EKG -  Stress Test -  ECHO -  Cardiac Cath -   Sleep Study -  CPAP -   Fasting Blood Sugar -  Checks Blood Sugar _____ times a day  Blood Thinner Instructions: Aspirin Instructions:  ERAS Protcol - PRE-SURGERY Ensure or G2-   COVID TEST- COVID + 03-11-21  noted 03-23-21 in note  COVID vaccine -  Activity-- Anesthesia review:   Patient denies shortness of breath, fever, cough and chest pain at PAT appointment   All instructions explained to the patient, with a verbal understanding of the material. Patient agrees to go over the instructions while at home for a better understanding. Patient also instructed to self quarantine after being tested for COVID-19. The opportunity to ask questions was provided.

## 2021-04-19 NOTE — Patient Instructions (Addendum)
DUE TO COVID-19 ONLY ONE VISITOR IS ALLOWED TO COME WITH YOU AND STAY IN THE WAITING ROOM ONLY DURING PRE OP AND PROCEDURE.   ?**NO VISITORS ARE ALLOWED IN THE SHORT STAY AREA OR RECOVERY ROOM!!** ? ? ?IF YOU WILL BE ADMITTED INTO THE HOSPITAL YOU ARE ALLOWED ONLY TWO SUPPORT PEOPLE DURING VISITATION HOURS ONLY (7 AM -8PM)   ? ?Up to two visitors ages 16+ are allowed at one time in a patient's room.  The visitors may rotate out with other people throughout the day.  Additionally, up to two children between the ages of 72 and 53 are allowed and do not count toward the number of allowed visitors.  Children within this age range must be accompanied by an adult visitor.  One adult visitor may remain with the patient overnight and must be in the room by 8 PM. ? ?The support person(s) must pass our screening, gel in and out, and wear a mask at all times, including in the patient?s room. ?Patients must also wear a mask when staff or their support person are in the room. ?Visitors GUEST BADGE MUST BE WORN VISIBLY  ?One adult visitor may remain with you overnight and MUST be in the room by 8 P.M. ? ?No visitors under the age of 48. Any visitor under the age of 62 must be accompanied by an adult.  ? ? You are not required to quarantine, however you are required to wear a well-fitted mask when you are out and around people not in your household.  ?Hand Hygiene often ?Do NOT share personal items ?Notify your provider if you are in close contact with someone who has COVID or you develop fever 100.4 or greater, new onset of sneezing, cough, sore throat, shortness of breath or body aches. ? ?     ? Your procedure is scheduled on: Friday, 05-12-21 ? ? Report to [facility] Main Entrance ? ?  Report to admitting at 5:15 AM ? ? Call this number if you have problems the morning of surgery [phone] ? ? Do not eat food :After Midnight. ? ? After Midnight you may have the following liquids until 4:30 AM DAY OF  SURGERY ? ?Water ?Black Coffee (sugar ok, NO MILK/CREAM OR CREAMERS)  ?Tea (sugar ok, NO MILK/CREAM OR CREAMERS) regular and decaf                             ?Plain Jell-O (NO RED)                                           ?Fruit ices (not with fruit pulp, NO RED)                                     ?Popsicles (NO RED)                                                                  ?Juice: apple, WHITE grape, WHITE cranberry ?Sports drinks like Gatorade (NO RED) ?Clear broth(vegetable,chicken,beef) ?     ?  ?  Oral Hygiene is also important to reduce your risk of infection.                                    ?Remember - BRUSH YOUR TEETH THE MORNING OF SURGERY WITH YOUR REGULAR TOOTHPASTE ? ? Do NOT smoke after Midnight ? ?Take these medicines the morning of surgery with A SIP OF WATER: Tylenol, Clonazepam, Xyzal, Metoprolol, Compazine.  Okay to use inhalers and eyedrops (bring with you day of surgery) ? ? Stop all vitamins and herbal supplements a week before surgery.   ? ? Stop Motrin, Aleve, Ibuprofen a week before surgery. ?                  ?           You may not have any metal on your body including hair pins, jewelry, and body piercing ? ?           Do not wear make-up, lotions, powders, perfumes or deodorant ? ?Do not wear nail polish including gel and S&S, artificial/acrylic nails, or any other type of covering on natural nails including finger and toenails. If you have artificial nails, gel coating, etc. that needs to be removed by a nail salon please have this removed prior to surgery or surgery may need to be canceled/ delayed if the surgeon/ anesthesia feels like they are unable to be safely monitored.  ? ?Do not shave  48 hours prior to surgery.  ? ? Contacts, dentures or bridgework may not be worn into surgery. ? ? Bring small overnight bag day of surgery. ? ? Do not bring valuables to the hospital. [place]. ?  ?Special Instructions: Bring a copy of your  healthcare power of attorney and living will documents the day of surgery if you haven't scanned them before. ? ?            Please read over the following fact sheets you were given: IF [place] [phone] ? ? ?         [facility] - Preparing for Surgery ?Before surgery, you can play an important role.  Because skin is not sterile, your skin needs to be as free of germs as possible.  You can reduce the number of germs on your skin by washing with CHG (chlorahexidine gluconate) soap before surgery.  CHG is an antiseptic cleaner which kills germs and bonds with the skin to continue killing germs even after washing. ?Please DO NOT use if you have an allergy to CHG or antibacterial soaps.  If your skin becomes reddened/irritated stop using the CHG and inform your nurse when you arrive at Short Stay. ?Do not shave (including legs and underarms) for at least 48 hours prior to the first CHG shower.  You may shave your face/neck. ?Please follow these instructions carefully: ? 1.  Shower with CHG Soap the night before surgery and the  morning of Surgery. ? 2.  If you choose to wash your hair, wash your hair first as usual with your  normal  shampoo. ? 3.  After you shampoo, rinse your hair and body thoroughly to remove the  shampoo.                           4.  Use CHG as you would any other liquid soap.  You can apply chg directly  to the skin and wash  ?                     Gently with a scrungie or clean washcloth. ? 5.  Apply the CHG Soap to your body ONLY FROM THE NECK DOWN.   Do not use on face/ open      ?                     Wound or open sores. Avoid contact with eyes, ears mouth and genitals (private parts).  ?                     Production manager,  Genitals (private parts) with your normal soap. ?            6.  Wash thoroughly, paying special attention to the area where your surgery  will be performed. ? 7.  Thoroughly rinse your body with warm water from the  neck down. ? 8.  DO NOT shower/wash with your normal soap after using and rinsing off  the CHG Soap. ?               9.  Pat yourself dry with a clean towel. ?           10.  Wear clean pajamas. ?           11.  Place clean sheets on your bed the night of your first shower and do not  sleep with pets. ?Day of Surgery : ?Do not apply any lotions/deodorants the morning of surgery.  Please wear clean clothes to the hospital/surgery center. ? ?FAILURE TO FOLLOW THESE INSTRUCTIONS MAY RESULT IN THE CANCELLATION OF YOUR SURGERY ?PATIENT SIGNATURE_________________________________ ? ?NURSE SIGNATURE__________________________________ ? ?________________________________________________________________________   ?

## 2021-04-19 NOTE — Progress Notes (Signed)
PCP -  ?Cardiologist -  ? ?PPM/ICD - pacemaker ?Device Orders -  ?Rep Notified -  ?Last device check-04-12-20 epic ? ?Chest x-ray -  ?EKG -  ?Stress Test -  ?ECHO - 05-31-20 epic ?Cardiac Cath -  ?MRI brain 05-04-20 epic ? ?Sleep Study -  ?CPAP -  ? ?Fasting Blood Sugar -  ?Checks Blood Sugar _____ times a day ? ?Blood Thinner Instructions: ?Aspirin Instructions: ? ?ERAS Protcol - ?PRE-SURGERY Ensure or G2-  ? ?COVID TEST- COVID + 03-11-21  noted 03-23-21 in note  ?COVID vaccine - ? ?Activity-- ?Anesthesia review:  ? ?Patient denies shortness of breath, fever, cough and chest pain at PAT appointment ? ? ?All instructions explained to the patient, with a verbal understanding of the material. Patient agrees to go over the instructions while at home for a better understanding. Patient also instructed to self quarantine after being tested for COVID-19. The opportunity to ask questions was provided. ?  ?

## 2021-04-20 DIAGNOSIS — C787 Secondary malignant neoplasm of liver and intrahepatic bile duct: Secondary | ICD-10-CM | POA: Diagnosis not present

## 2021-04-20 DIAGNOSIS — K449 Diaphragmatic hernia without obstruction or gangrene: Secondary | ICD-10-CM | POA: Diagnosis not present

## 2021-04-20 DIAGNOSIS — Z7189 Other specified counseling: Secondary | ICD-10-CM | POA: Diagnosis not present

## 2021-04-24 ENCOUNTER — Telehealth: Payer: Self-pay | Admitting: Pharmacist

## 2021-04-24 ENCOUNTER — Other Ambulatory Visit: Payer: Medicare Other

## 2021-04-24 NOTE — Chronic Care Management (AMB) (Signed)
° ° °  Chronic Care Management Pharmacy Assistant   Name: [name]  MRN: [id number] DOB: 01-Jul-1955  04/26/2021 APPOINTMENT REMINDER   [name] was reminded to have all medications, supplements and any blood glucose and blood pressure readings available for review with Jeni Salles, Pharm. D, at her telephone visit on 04/26/2021 at 1:30.   Questions: Have you had any recent office visit or specialist visit outside of [facility]? [facility] last week for oncology for a lump in her breast.  Are there any concerns you would like to discuss during your office visit? Patient does not have any concerns at this time. She does have surgery scheduled for this month and another appointment for a procedure at [facility] this week 04/27/21.   Are you having any problems obtaining your medications? (Whether it pharmacy issues or cost) Patient denies any issues.   If patient has any PAP medications ask if they are having any problems getting their PAP medication or refill?  Patient denies any medications coming from PAP.  Care Gaps: AWV - scheduled 03/15/2022 Last BP - 130/80 on 12/26/2020 Last A1C - 5.6 on 03/09/2021 Papsmear - never done Mammogram - overdue  Star Rating Drug: None  Any gaps in medications fill history? No  Gennie Alma [facility]  Catering manager [phone]

## 2021-04-26 ENCOUNTER — Ambulatory Visit (INDEPENDENT_AMBULATORY_CARE_PROVIDER_SITE_OTHER): Payer: Medicare Other | Admitting: Pharmacist

## 2021-04-26 DIAGNOSIS — F419 Anxiety disorder, unspecified: Secondary | ICD-10-CM

## 2021-04-26 DIAGNOSIS — I10 Essential (primary) hypertension: Secondary | ICD-10-CM

## 2021-04-26 NOTE — Progress Notes (Addendum)
COVID swab appointment: Pos Covid 03-11-21 ?  ?COVID Vaccine Completed: Yes x1  ?Date COVID Vaccine completed:  05-01-19 ?Has received booster: ?COVID vaccine manufacturer: [place]  ? ?Date of COVID positive in last 90 days: Yes, +Covid 03-11-21 ? ?PCP - [name], MD ?Cardiologist - Virl Axe, MD ? ?Chest x-ray - CT chest 01-12-21 [facility] ?EKG -  ?Stress Test -  ?ECHO - 05-31-20 Epic ?Cardiac Cath -  ?Pacemaker/ICD device last checked: 04-12-20 Epic ?Spinal Cord Stimulator: ? ?Bowel Prep - N/A ? ?Sleep Study -  ?CPAP -  ? ?Fasting Blood Sugar -  ?Checks Blood Sugar _____ times a day ? ?Blood Thinner Instructions: ?Aspirin Instructions: ?Last Dose: ? ?Activity level:  Can go up a flight of stairs and perform activities of daily living without stopping and without symptoms of chest pain or shortness of breath. ?  Able to exercise without symptoms ? ?Unable to go up a flight of stairs without symptoms of  ?   ? ?Anesthesia review: Afib, atrial tachycardia, hx of CVA. Sinus node dysfunction, dysautonomia ? ?Patient denies shortness of breath, fever, cough and chest pain at PAT appointment ? ? ?Patient verbalized understanding of instructions that were given to them at the PAT appointment. Patient was also instructed that they will need to review over the PAT instructions again at home before surgery.  ?

## 2021-04-26 NOTE — Progress Notes (Addendum)
Chronic Care Management Pharmacy Note  04/26/2021 Name:  [name] MRN:  [id number] DOB:  [birth year]  Summary: BP at goal < 130/80    Recommendations/Changes made from today's visit: -Recommended repeat vitamin D level -Recommended trial of melatonin timed release 2 or 3 mg (not to exceed 5 mg)    Plan: BP assessment in 3 months Follow up in 6 months  Subjective: [name] is an 66 y.o. year old female who is a primary patient of Panosh, Standley Brooking, MD.  The CCM team was consulted for assistance with disease management and care coordination needs.    Engaged with patient by telephone for follow up visit in response to provider referral for pharmacy case management and/or care coordination services.   Consent to Services:  The patient was given information about Chronic Care Management services, agreed to services, and gave verbal consent prior to initiation of services.  Please see initial visit note for detailed documentation.   Patient Care Team: Panosh, Standley Brooking, MD as PCP - Lisa Roca, MD as Consulting Physician (Neurosurgery) [name], MD as Consulting Physician (Oncology) [name], MD as Referring Physician (Internal Medicine) Jodi Marble, MD as Consulting Physician (Otolaryngology) Danella Sensing, MD as Consulting Physician (Dermatology) Viona Gilmore, [facility] as Pharmacist (Pharmacist) Harold Hedge, Darrick Grinder, MD as Consulting Physician (Allergy and Immunology)  Recent office visit 03/23/21 [name], MD: Patient presented for video visit for COVID infection.   03/14/21 Rolene Arbour, LPN: Patient presented for AWV.  01/17/21 [name], MD: Patient presented for video visit for possible UTI. Prescribed Keflex.  Recent consult visits: 05/04/63 [name], MD [facility] oncology): Patient presented for metastatic melanoma to liver follow up. Plan for FNA by ultrasound.  03/21/21 Fenton Malling, PA-C (telehealth):  Patient presented for mouth ulcers from Paxlovid.  03/12/21 Reynolds Bowl, NP (telehealth): Patient presented for COVID infection. Prescribed Paxlovid and Flonase.  11/09/20 Willow Ora (optometry):  Patient presented for follow up eye exam. Unable to access notes.  10/27/20 Johnathan Hausen, MD (surgery): Patient presented for consult for hiatal hernia surgery.  05/31/20 Virl Axe, MD (cardiology): Patient presented for POTS. D/c'd metoprolol.   Hospital visits: 10/31/20 Patient presented to [facility] for [place] testing.  Objective:  Lab Results  Component Value Date   CREATININE 0.81 03/09/2021   BUN 16 03/09/2021   GFR 85.78 05/24/2011   GFRNONAA >60 03/09/2021   GFRAA >60 05/19/2019   NA 137 03/09/2021   K 4.4 03/09/2021   CALCIUM 9.4 03/09/2021   CO2 24 03/09/2021   GLUCOSE 95 03/09/2021    Lab Results  Component Value Date/Time   HGBA1C 5.6 03/09/2021 12:52 PM   HGBA1C 5.8 (H) 01/23/2021 02:27 PM   GFR 85.78 05/24/2011 11:08 AM   GFR 65.45 10/16/2010 02:11 PM    Last diabetic Eye exam: No results found for: HMDIABEYEEXA  Last diabetic Foot exam: No results found for: HMDIABFOOTEX   Lab Results  Component Value Date   CHOL 145 02/09/2014   HDL 47.90 02/09/2014   LDLCALC 73 02/09/2014   LDLDIRECT 109.7 10/11/2010   TRIG 123.0 02/09/2014   CHOLHDL 3 02/09/2014    Hepatic Function Latest Ref Rng & Units 08/09/2020 05/27/2015 02/23/2015  Total Protein 6.0 - 8.3 g/dL 7.4 8.0 8.2  Albumin 3.5 - 5.2 g/dL 4.2 4.4 4.7  AST 0 - 37 U/L 27 25 35(H)  ALT 0 - 35 U/L 27 33 42  Alk Phosphatase  39 - 117 U/L 120(H) 127 157(H)  Total Bilirubin 0.2 - 1.2 mg/dL 0.3 0.40 0.47  Bilirubin, Direct 0.0 - 0.3 mg/dL 0.0 - -    Lab Results  Component Value Date/Time   TSH 1.146 12/17/2013 01:09 PM   TSH 0.318 (L) 08/28/2013 05:40 PM   TSH 0.82 05/01/2007 11:38 AM    CBC Latest Ref Rng & Units 03/09/2021 12/13/2020 11/17/2020  WBC 4.0 - 10.5 K/uL 6.6  6.5 5.7  Hemoglobin 12.0 - 15.0 g/dL 14.1 13.9 13.8  Hematocrit 36.0 - 46.0 % 43.1 42.1 40.9  Platelets 150 - 400 K/uL 291 280 259    Lab Results  Component Value Date/Time   VD25OH 24 (L) 10/24/2009 08:23 PM   VD25OH 24 (L) 11/17/2008 07:53 PM    Clinical ASCVD: Yes  The ASCVD Risk score (Arnett DK, et al., 2019) failed to calculate for the following reasons:   The patient has a prior MI or stroke diagnosis    Depression screen [facility] 2/9 03/14/2021 03/22/2020 03/15/2020  Decreased Interest 0 0 0  Down, Depressed, Hopeless 0 0 0  PHQ - 2 Score 0 0 0  Some recent data might be hidden      Social History   Tobacco Use  Smoking Status Never  Smokeless Tobacco Never   BP Readings from Last 3 Encounters:  03/09/21 134/85  01/23/21 (!) 123/93  12/26/20 130/80   Pulse Readings from Last 3 Encounters:  03/23/21 92  03/09/21 66  01/23/21 66   Wt Readings from Last 3 Encounters:  03/23/21 141 lb (64 kg)  03/14/21 145 lb (65.8 kg)  03/09/21 145 lb (65.8 kg)   BMI Readings from Last 3 Encounters:  03/23/21 24.98 kg/m  03/14/21 25.69 kg/m  03/09/21 25.69 kg/m    Assessment/Interventions: Review of patient past medical history, allergies, medications, health status, including review of consultants reports, laboratory and other test data, was performed as part of comprehensive evaluation and provision of chronic care management services.   SDOH:  (Social Determinants of Health) assessments and interventions performed: No  SDOH Screenings   Alcohol Screen: Not on file  Depression (PHQ2-9): Low Risk    PHQ-2 Score: 0  Financial Resource Strain: Low Risk    Difficulty of Paying Living Expenses: Not hard at all  Food Insecurity: No Food Insecurity   Worried About Charity fundraiser in the Last Year: Never true   Ran Out of Food in the Last Year: Never true  Housing: Low Risk    Last Housing Risk Score: 0  Physical Activity: Sufficiently Active   Days of Exercise per  Week: 4 days   Minutes of Exercise per Session: 90 min  Social Connections: Engineer, building services of Communication with Friends and Family: More than three times a week   Frequency of Social Gatherings with Friends and Family: More than three times a week   Attends Religious Services: More than 4 times per year   Active Member of [employer] or Organizations: Yes   Attends Music therapist: More than 4 times per year   Marital Status: Married  Stress: No Stress Concern Present   Feeling of Stress : Not at all  Tobacco Use: Low Risk    Smoking Tobacco Use: Never   Smokeless Tobacco Use: Never   Passive Exposure: Not on file  Transportation Needs: No Transportation Needs   Lack of Transportation (Medical): No   Lack of Transportation (Non-Medical): No    CCM  Care Plan  Allergies  Allergen Reactions   Bee Venom Swelling   Doxycycline Hives   Erythromycin Hives   Gadavist [Gadobutrol] Hives, Shortness Of Breath and Itching   Paxlovid [Nirmatrelvir-Ritonavir] Other (See Comments)    Mouth swelling, mouth ulcers   Penicillins Hives    Did it involve swelling of the face/tongue/throat, SOB, or low BP? No Did it involve sudden or severe rash/hives, skin peeling, or any reaction on the inside of your mouth or nose? Yes Did you need to seek medical attention at a hospital or doctor's office? Yes When did it last happen?      5+ years If all above answers are NO, may proceed with cephalosporin use.    Sulfa Antibiotics Hives   Preparation H [Pramox-Pe-Glycerin-Petrolatum] Hives    Cannot use  Cream or Suppositories    Phenergan [Promethazine Hcl] Other (See Comments)    Causes restless leg syndrome    Medications Reviewed Today     Reviewed by Burnis Medin, MD (Physician) on 03/23/21 at [name] List Status: <None>   Medication Order Taking? Sig Documenting Provider Last Dose Status Informant  acetaminophen (TYLENOL) 500 MG tablet [id number] Yes Take 500 mg  by mouth every 6 (six) hours as needed for moderate pain. [provider] Taking Active Self           Med Note [facility] MENDEZ, CARLOS A   Tue May 31, 2020  2:15 PM)    albuterol (PROVENTIL) (2.5 MG/3ML) 0.083% nebulizer solution [id number] Yes Take 3 mLs (2.5 mg total) by nebulization every 4 (four) hours as needed for wheezing or shortness of breath. Bobbitt, Sedalia Muta, MD Taking Active Self  albuterol (VENTOLIN HFA) 108 (90 Base) MCG/ACT inhaler [id number] Yes INHALE 2 PUFFS INTO THE LUNGS EVERY 4 (FOUR) HOURS AS NEEDED FOR WHEEZING OR SHORTNESS OF BREATH. Panosh, Standley Brooking, MD Taking Active Self  augmented betamethasone dipropionate (DIPROLENE) 0.05 % ointment [id number] Yes Apply topically 2 (two) times daily. Limit to no longer than 2 weeks use. Not on face. Panosh, Standley Brooking, MD Taking Active   calcium carbonate (TUMS - DOSED IN MG ELEMENTAL CALCIUM) 500 MG chewable tablet [id number] Yes Chew 2 tablets by mouth daily as needed for indigestion or heartburn. [provider] Taking Active Self  CALCIUM PO [id number] Yes Take 1 tablet by mouth daily. [provider] Taking Active Self  cephALEXin (KEFLEX) 500 MG capsule [id number] Yes Take 1 capsule (500 mg total) by mouth 4 (four) times daily. For relapsing UTI Panosh, Standley Brooking, MD Taking Active   cholestyramine (QUESTRAN) 4 g packet [id number] Yes Take 4 g by mouth 2 (two) times daily. [provider] Taking Active Self  clobetasol cream (TEMOVATE) 0.05 % [id number] Yes Apply 1 application topically 2 (two) times daily as needed (rash). [provider] Taking Active Self  clonazePAM (KLONOPIN) 1 MG tablet [id number]  TAKE 1 TABLET BY MOUTH TWICE A DAY. MAY TAKE UP TO 3 TIMES DAILY IF NEEDED FOR EXTRA ANXIETY Panosh, Standley Brooking, MD  Active   cloNIDine (CATAPRES) 0.1 MG tablet [id number] Yes Take 1 tablet (0.1 mg total) by mouth at bedtime. Deboraha Sprang, MD Taking Active Self  cyanocobalamin (,VITAMIN B-12,) 1000  MCG/ML injection [id number] Yes Inject 1 mL (1,000 mcg total) into the muscle every 30 (thirty) days. Panosh, Standley Brooking, MD Taking Active Self  Desoximetasone (TOPICORT) 0.25 % ointment [id number] Yes Apply 1 application topically 2 (two) times daily.  Patient taking differently: Apply 1  application topically 2 (two) times daily as needed (rash).   Panosh, Standley Brooking, MD Taking Active   diphenhydrAMINE (BENADRYL) 25 MG tablet [id number] Yes Take 25 mg by mouth daily as needed for allergies. [provider] Taking Active Self  EPINEPHrine 0.3 mg/0.3 mL IJ SOAJ injection [id number] Yes Inject 0.3 mg into the muscle as needed for anaphylaxis. [provider] Taking Active Self  fluorouracil (EFUDEX) 5 % cream [id number] Yes Apply 1 application topically 2 (two) times daily as needed (rash). [provider] Taking Active Self  fluticasone (FLONASE) 50 MCG/ACT nasal spray [id number] Yes Place 2 sprays into both nostrils daily. Leath-Warren, Alda Lea, NP Taking Active   fluticasone (FLOVENT HFA) 44 MCG/ACT inhaler [id number] Yes Inhale 2 puffs into the lungs 2 (two) times daily as needed (shortness of breath). [provider] Taking Active Self  hyoscyamine (LEVSIN SL) 0.125 MG SL tablet [id number] Yes Place 0.125 mg under the tongue every 6 (six) hours as needed for cramping. [provider] Taking Active Self  levETIRAcetam (KEPPRA) 250 MG tablet [id number] Yes Take 250 mg by mouth See admin instructions. Take 236m three times a day at 6am, 12pm, 6pm. [provider] Taking Active Self           Med Note (Benancio DeedsSep 19, 2022 11:21 AM) Important that she takes at 6am, 12pm, and 6pm  levocetirizine (XYZAL) 5 MG tablet [id number] TAKE 1 TABLET BY MOUTH EVERY DAY AS NEEDED  Patient taking differently: Take 5 mg by mouth daily as needed for allergies.   Bobbitt, RSedalia Muta MD Taking Active   lidocaine (XYLOCAINE) 2 % solution [id number]  510mswish and swallow as needed for sore throat; take with nystatin susp BuMar DaringPA-C Taking Active   lipase/protease/amylase (CREON) 12000 units CPEP capsule [id number] Take 12,000 Units by mouth daily with breakfast. [provider] Taking Active Self           Med Note ([facility] Center[facility]Z, CARLOS A   Tue May 31, 2020  2:15 PM)    metoprolol tartrate (LOPRESSOR) 25 MG tablet [id number] Take 1 tablet (25 mg total) by mouth 2 (two) times daily. KlDeboraha SprangMD Taking Active Self  nystatin (MYCOSTATIN) 100000 UNIT/ML suspension [id number] Use as directed 5 mLs (500,000 Units total) in the mouth or throat 3 (three) times daily as needed (thrush). Panosh, WaStandley BrookingMD Taking Active Self  Olopatadine HCl (PATADAY) 0.2 % SOLN [id number] Use one drop in each eye once daily as needed.  Patient taking differently: Place 1 drop into both eyes daily as needed (itchy eyes).   Bobbitt, RaSedalia MutaMD Taking Active Self  polyethylene glycol ([facility] GLYCOLAX) packet [id number] Take 17 g by mouth at bedtime. [provider] Taking Active Self  potassium chloride SA (K-DUR,KLOR-CON) 20 MEQ tablet [id number] Take 1 tablet (20 mEq total) by mouth 2 (two) times daily. ThOwens SharkNP Taking Active Self  prochlorperazine (COMPAZINE) 10 MG tablet [id number] Take 1 tablet (10 mg total) by mouth every 6 (six) hours as needed for nausea or vomiting. FrLaurey MoraleMD Taking Active Self  sodium chloride (OCEAN) 0.65 % SOLN nasal spray [id number] Place 1 spray into both nostrils as needed for congestion.  [provider] Taking Active Self  Spacer/Aero-Holding ChDonavan BurnetIAMOND) [id number] See admin instructions. [provider] Taking Active Self  Syringe/Needle, Disp, (SYRINGE 3CC/27GX1-1/4") 27G  X 1-1/4" 3 ML MISC [id number] Yes Inject 1 mL into the muscle every 30 (thirty) days. Vit B12 Panosh, Standley Brooking, MD Taking Active Self   Vitamin D, Ergocalciferol, (DRISDOL) 50000 units CAPS capsule [id number] Yes TAKE 1 CAPSULE (50,000 UNITS TOTAL) BY MOUTH EVERY 7 (SEVEN) DAYS. 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   S/P laparoscopic cholecystectomy 03/28/2018   Dehydration 05/18/2016   History of pacemaker    POTS (postural orthostatic tachycardia syndrome)    Iron deficiency anemia 11/11/2014   Chronic colitis 08/28/2013   Malignant melanoma, metastatic ([place]) 02/09/2013   Malignant melanoma ([place]) 12/01/2012   Brain metastasis ([place]) 10/16/2012   Hypertension 09/30/2012   Anxiety state, unspecified 09/30/2012   Hypoxemia 09/30/2012   Dysautonomia ([place]) 02/25/2011   Anticoagulant long-term use 01/19/2011   Neuritis 01/19/2011   Skin change 01/19/2011   CVA (cerebral infarction)    Palpitation 06/28/2010   Pacemaker-Biotronik 06/28/2010   Dizziness and giddiness 04/27/2010   DIVERTICULOSIS, COLON, HX OF 01/19/2009   CAROTID SINUS SYNDROME 11/17/2008   OTHER MALAISE AND FATIGUE 11/17/2008   HYPERTENSION, BENIGN 11/02/2008   ATRIAL TACHYCARDIA 09/16/2008   SYNCOPE, HX OF 08/09/2008   INJURY UNSPEC NERVE SHOULDER GIRDLE&UPPER LIMB 07/12/2008   PERSONAL HISTORY OF MALIGNANT MELANOMA OF SKIN 07/12/2008   LUQ PAIN 12/08/2007   NECK PAIN 09/26/2007   NUMBNESS, ARM 09/26/2007   CPK, ABNORMAL 09/26/2007   ARM PAIN, LEFT 06/25/2007   SWELLING OF LIMB 06/25/2007   OSTEOPENIA 06/25/2007    Immunization History  Administered Date(s) Administered   Influenza Split 03/03/2012   PFIZER(Purple Top)SARS-COV-2 Vaccination 05/01/2019   PPD Test 07/01/2013    Pneumococcal Polysaccharide-23 10/03/2012   Patient reports she is mentally fatigued and frusterated with everything going on. Patient cannot have the GI surgery until they know if lump is cancerous and that should be dealt with tomorrow.  Patient also got an Advantage plan and is paying every month because she did not get a Part D plan right away.  She has had medicare since 2017-2018.  Patient reports she has some residual fatigue from COVID and is also not sleeping well.  She sometimes takes a Benadryl at night and this knocks her out. She does wake up at 3am and rolls around and then has difficulty falling back asleep. She has tried melatonin in the past without success.  Conditions to be addressed/monitored:  Hypertension, GERD, Asthma, Anxiety, Osteopenia, Allergic Rhinitis and Seizures, Vitamin B12 deficiency, Vitamin D deficiency  Conditions addressed this visit: Hypertension, anxiety  Care Plan : CCM Pharmacy Care Plan  Updates made by Viona Gilmore, [place] since 04/26/2021 12:00 AM     Problem: Problem: Hypertension, GERD, Asthma, Anxiety, Osteopenia, Allergic Rhinitis and Seizures, Vitamin B12 deficiency, Vitamin D deficiency      Long-Range Goal: Patient-Specific Goal   Start Date: 07/01/2020  Expected End Date: 07/01/2021  Recent Progress: On track  Priority: High  Note:   Current Barriers:  Unable to independently monitor therapeutic efficacy  Pharmacist Clinical Goal(s):  Patient will achieve adherence to monitoring guidelines and medication adherence to achieve therapeutic efficacy achieve control of blood pressure  as evidenced by home readings  through collaboration with PharmD and provider.   Interventions: 1:1 collaboration with Panosh, Standley Brooking, MD regarding development and update of comprehensive plan of care as evidenced by provider attestation and co-signature Inter-disciplinary care team collaboration (see longitudinal plan of care) Comprehensive medication  review performed; medication list updated in electronic medical record  Hypertension (BP goal <130/80) -Controlled -Current treatment: Clonidine 0.27m, 1 tablet at bedtime - Appropriate, Effective, Safe, Accessible Metoprolol tartrate 25 mg 1 tablet twice daily - Appropriate, Effective, Safe, Accessible -Medications previously tried: n/a  -Current home readings: 120/83 (in doctor's office) -Current dietary habits: did not discuss -Current exercise habits: exercising 2-3 miles 3-4 days a week; walked 2.5 miles yesterday -Denies hypotensive/hypertensive symptoms -Educated on Importance of home blood pressure monitoring; Proper BP monitoring technique; -Counseled to monitor BP at home daily, document, and provide log at future appointments -Counseled on diet and exercise extensively Recommended to continue current medication  Asthma (Goal: control symptoms) -Controlled -Current treatment  Albuterol 0.083% nebulizer solution, 3 MLs every four hours as needed for wheezing or shortness of breath - Appropriate, Effective, Safe, Accessible Albuterol HFA 10104m/act inhaler, 2 puffs every hours as needed for wheezing or shortness of breath - Appropriate, Effective, Safe, Accessible Advair 230/21 inhale once daily - Appropriate, Effective, Safe, Accessible -Medications previously tried: n/a  -Exacerbations requiring treatment in last 6 months: none -Patient reports consistent use of maintenance inhaler -Frequency of rescue inhaler use: none in last 6 months -Counseled on Proper inhaler technique; Benefits of consistent maintenance inhaler use When to use rescue inhaler Differences between maintenance and rescue inhalers -Recommended to continue current medication  Depression/Anxiety (Goal: minimize symptoms) -Controlled -Current treatment: Clonazepam 39m37m1 tablet twice daily - Appropriate, Effective, Safe, Accessible -Medications previously tried/failed: n/a -PHQ9: n/a -GAD7:  n/a -Educated on Benefits of medication for symptom control -Recommended to continue current medication  Osteopenia (Goal prevent fractures) -Controlled -Last DEXA Scan: 07/31/19  T-Score femoral neck: -2.0  T-Score total hip: n/a  T-Score lumbar spine: -2.0  T-Score forearm radius: n/a  10-year probability of major osteoporotic fracture: unknown  10-year probability of hip fracture: unknown -Patient is not a candidate for pharmacologic treatment -Current treatment  Vitamin D3 50,000 units, 1 capsule once a week - Appropriate, Query effective, Safe, Accessible Calcium 500 mg, 1 tablet once daily - Appropriate, Effective, Safe, Accessible -Medications previously tried: n/a  -Recommend [phone] units of vitamin D daily. Recommend 1200 mg of calcium daily from dietary and supplemental sources. Recommend weight-bearing and muscle strengthening exercises for building and maintaining bone density. -Recommended to continue current medication  GERD/Heartburn (Goal: minimize symptoms) -Controlled -Current treatment  Calcium carbonate (Tums) 500m23mhew 1 tablet daily as needed for indigestion or heartburn - Appropriate, Effective, Safe, Accessible -Medications previously tried: none  -Recommended to continue current medication  Colitis (Goal: minimize symptoms) -Controlled -Current treatment  Lipase/ protease/ amylase (Creon) 12,000 units, 1 capsule once daily with breakfast  (taking as needed) - Appropriate, Effective, Safe, Accessible -Medications previously tried: none  -Recommended to continue current medication Assessed patient finances. Patient can afford Creon right now.  Atopic dermatitis (Goal: minimize symptoms) -Not ideally controlled -Current treatment  Betamethasone dipropionate 0.05% ointment, apply two times daily. Limit to no longer than 2 weeks use. Not on face. Desoximetasone 0.252.19%tment, 1 application twice daily Diphenhydramine (Benadryl) 25mg12mtablet at bedtime  as needed Epipen as needed -Medications previously tried: n/a  -Recommended to continue current medication  Allergic rhinitis (Goal: minimize symptoms) -  Controlled -Current treatment  Fluticasone (Flonase) 59mg/act nasal spray, 2 sprays into both nostrils daily as needed - Appropriate, Effective, Safe, Accessible levocetirizine (Xyzal) 519m 1 tablet everday as needed - Appropriate, Effective, Safe, Accessible Sodium chloride 0.65% nasal spray solution, 1 spray into both nostrils as needed for congesion (uses 3 to 4 x/ day) - Appropriate, Effective, Safe, Accessible Olopatadine (Pataday) 0.2% solution, 1 drop in each eye once daily as needed - Appropriate, Effective, Safe, Accessible -Medications previously tried: montelukast (lethargy), claritin (ineffective)   -Recommended to continue current medication Counseled on duplication of therapy with Benadryl, Claritin and Xyzal and patient stopped using Claritin due to lack of efficacy.  Seizures (Goal: prevent seizures) -Controlled -Current treatment  levetiracetam 25035m1 tablet three times daily - Appropriate, Effective, Safe, Accessible -Medications previously tried: none  -Recommended to continue current medication  Thrush (Goal: minimize symptoms) -Controlled -Current treatment  nystatin 100000 unit/ ml, 5 MLs three times daily as needed thrush -Medications previously tried: none  -Recommended to continue current medication  Pain (Goal: minimize pain) -Controlled -Current treatment  Ibuprofen 200m36m00-400mg21mee times daily as needed for moderate pain Acetaminophen 500mg,73mablet at bedtime -Medications previously tried: n/a  -  Patient had fractured her toe and they saw arthritis on her x-ray; suggested trying a topical product such as Voltaren gel but she prefers not to and has had success with yunkers ointment from medical supply   Vitamin B12 deficiency (Goal: 211-911) -Controlled -Current treatment  Cyanocobalamin  1000 mcg SL 1 tablet daily -Medications previously tried: none  -Recommended to continue current medication  Health Maintenance -Vaccine gaps: shingles, COVID, tetanus -Current therapy:  Hydrocortisone 25mg s50msitory, place 1 rectally at bedtime as needed for hemorrhoids Potassium chloride 20mEq, 47mblet twice daily -Educated on Cost vs benefit of each product must be carefully weighed by individual consumer -Patient is satisfied with current therapy and denies issues -Recommended to continue current medication  Patient Goals/Self-Care Activities Patient will:  - take medications as prescribed check blood pressure daily, document, and provide at future appointments target a minimum of 150 minutes of moderate intensity exercise weekly  Follow Up Plan: Telephone follow up appointment with care management team member scheduled for: 6 months      Medication Assistance: None required.  Patient affirms current coverage meets needs.  Compliance/Adherence/Medication fill history: Care Gaps: Tetanus, shingrix, HIV screen, Hep C screening PAP smear Last BP - 130/80 on 12/26/2020 Last A1C - 5.6 on 03/09/2021  Star-Rating Drugs: None  Patient's preferred pharmacy is:  [facility] [id number] [place] [phone] box? Yes Pt endorses 100% compliance  We discussed: Current pharmacy is preferred with insurance plan and patient is satisfied with pharmacy services Patient decided to: Continue current medication management strategy  Care Plan and Follow Up Patient Decision:  Patient agrees to Care Plan and Follow-up.  Plan: Telephone follow up appointment with care management team member scheduled for:  6  months  MadelineJeni Salles BCACP ClGrenvilleist [employer] [place]-[phone]

## 2021-04-26 NOTE — Patient Instructions (Signed)
Hi Eliya,  It was great to catch up with you again! If you want to, go ahead and try the melatonin timed release 2 or 3 mg per night. I would try not to go above 5 mg as this can sometimes have the opposite effect. You could also try 1/2 of a benadryl tablet to see if this gives you less of that morning grogginess as well.   Please reach out to me if you have any questions or need anything before our follow up!  Best, [name]  Jeni Salles, PharmD, [facility]   Visit Information   Goals Addressed   None    Patient Care Plan: CCM Pharmacy Care Plan     Problem Identified: Problem: Hypertension, GERD, Asthma, Anxiety, Osteopenia, Allergic Rhinitis and Seizures, Vitamin B12 deficiency, Vitamin D deficiency      Long-Range Goal: Patient-Specific Goal   Start Date: 07/01/2020  Expected End Date: 07/01/2021  Recent Progress: On track  Priority: High  Note:   Current Barriers:  Unable to independently monitor therapeutic efficacy  Pharmacist Clinical Goal(s):  Patient will achieve adherence to monitoring guidelines and medication adherence to achieve therapeutic efficacy achieve control of blood pressure as evidenced by home readings  through collaboration with PharmD and provider.   Interventions: 1:1 collaboration with Panosh, Standley Brooking, MD regarding development and update of comprehensive plan of care as evidenced by provider attestation and co-signature Inter-disciplinary care team collaboration (see longitudinal plan of care) Comprehensive medication review performed; medication list updated in electronic medical record  Hypertension (BP goal <130/80) -Controlled -Current treatment: Clonidine 0.1mg , 1 tablet at bedtime - Appropriate, Effective, Safe, Accessible Metoprolol tartrate 25 mg 1 tablet twice daily - Appropriate, Effective, Safe, Accessible -Medications previously tried: n/a  -Current home readings:  120/83 (in doctor's office) -Current dietary habits: did not discuss -Current exercise habits: exercising 2-3 miles 3-4 days a week; walked 2.5 miles yesterday -Denies hypotensive/hypertensive symptoms -Educated on Importance of home blood pressure monitoring; Proper BP monitoring technique; -Counseled to monitor BP at home daily, document, and provide log at future appointments -Counseled on diet and exercise extensively Recommended to continue current medication  Asthma (Goal: control symptoms) -Controlled -Current treatment  Albuterol 0.083% nebulizer solution, 3 MLs every four hours as needed for wheezing or shortness of breath - Appropriate, Effective, Safe, Accessible Albuterol HFA 134mcg/act inhaler, 2 puffs every hours as needed for wheezing or shortness of breath - Appropriate, Effective, Safe, Accessible Advair 230/21 inhale once daily - Appropriate, Effective, Safe, Accessible -Medications previously tried: n/a  -Exacerbations requiring treatment in last 6 months: none -Patient reports consistent use of maintenance inhaler -Frequency of rescue inhaler use: none in last 6 months -Counseled on Proper inhaler technique; Benefits of consistent maintenance inhaler use When to use rescue inhaler Differences between maintenance and rescue inhalers -Recommended to continue current medication  Depression/Anxiety (Goal: minimize symptoms) -Controlled -Current treatment: Clonazepam 1mg , 1 tablet twice daily - Appropriate, Effective, Safe, Accessible -Medications previously tried/failed: n/a -PHQ9: n/a -GAD7: n/a -Educated on Benefits of medication for symptom control -Recommended to continue current medication  Osteopenia (Goal prevent fractures) -Controlled -Last DEXA Scan: 07/31/19  T-Score femoral neck: -2.0  T-Score total hip: n/a  T-Score lumbar spine: -2.0  T-Score forearm radius: n/a  10-year probability of major osteoporotic fracture: unknown  10-year probability of  hip fracture: unknown -Patient is not a candidate for pharmacologic treatment -Current treatment  Vitamin D3 50,000 units, 1 capsule once a week - Appropriate,  Query effective, Safe, Accessible Calcium 500 mg, 1 tablet once daily - Appropriate, Effective, Safe, Accessible -Medications previously tried: n/a  -Recommend [phone] units of vitamin D daily. Recommend 1200 mg of calcium daily from dietary and supplemental sources. Recommend weight-bearing and muscle strengthening exercises for building and maintaining bone density. -Recommended to continue current medication  GERD/Heartburn (Goal: minimize symptoms) -Controlled -Current treatment  Calcium carbonate (Tums) 500mg , chew 1 tablet daily as needed for indigestion or heartburn - Appropriate, Effective, Safe, Accessible -Medications previously tried: none  -Recommended to continue current medication  Colitis (Goal: minimize symptoms) -Controlled -Current treatment  Lipase/ protease/ amylase (Creon) 12,000 units, 1 capsule once daily with breakfast  (taking as needed) - Appropriate, Effective, Safe, Accessible -Medications previously tried: none  -Recommended to continue current medication Assessed patient finances. Patient can afford Creon right now.  Atopic dermatitis (Goal: minimize symptoms) -Not ideally controlled -Current treatment  Betamethasone dipropionate 0.05% ointment, apply two times daily. Limit to no longer than 2 weeks use. Not on face. Desoximetasone 4.33% ointment, 1 application twice daily Diphenhydramine (Benadryl) 25mg , 1 tablet at bedtime as needed Epipen as needed -Medications previously tried: n/a  -Recommended to continue current medication  Allergic rhinitis (Goal: minimize symptoms) -Controlled -Current treatment  Fluticasone (Flonase) 3mcg/act nasal spray, 2 sprays into both nostrils daily as needed - Appropriate, Effective, Safe, Accessible levocetirizine (Xyzal) 5mg , 1 tablet everday as needed -  Appropriate, Effective, Safe, Accessible Sodium chloride 0.65% nasal spray solution, 1 spray into both nostrils as needed for congesion (uses 3 to 4 x/ day) - Appropriate, Effective, Safe, Accessible Olopatadine (Pataday) 0.2% solution, 1 drop in each eye once daily as needed - Appropriate, Effective, Safe, Accessible -Medications previously tried: montelukast (lethargy), claritin (ineffective)   -Recommended to continue current medication Counseled on duplication of therapy with Benadryl, Claritin and Xyzal and patient stopped using Claritin due to lack of efficacy.  Seizures (Goal: prevent seizures) -Controlled -Current treatment  levetiracetam 250mg , 1 tablet three times daily - Appropriate, Effective, Safe, Accessible -Medications previously tried: none  -Recommended to continue current medication  Thrush (Goal: minimize symptoms) -Controlled -Current treatment  nystatin 100000 unit/ ml, 5 MLs three times daily as needed thrush -Medications previously tried: none  -Recommended to continue current medication  Pain (Goal: minimize pain) -Controlled -Current treatment  Ibuprofen 200mg , 200-400mg  three times daily as needed for moderate pain Acetaminophen 500mg , 1 tablet at bedtime -Medications previously tried: n/a  -  Patient had fractured her toe and they saw arthritis on her x-ray; suggested trying a topical product such as Voltaren gel but she prefers not to and has had success with yunkers ointment from medical supply   Vitamin B12 deficiency (Goal: 211-911) -Controlled -Current treatment  Cyanocobalamin 1000 mcg SL 1 tablet daily -Medications previously tried: none  -Recommended to continue current medication  Health Maintenance -Vaccine gaps: shingles, COVID, tetanus -Current therapy:  Hydrocortisone 25mg  suppository, place 1 rectally at bedtime as needed for hemorrhoids Potassium chloride 69mEq, 1 tablet twice daily -Educated on Cost vs benefit of each product must  be carefully weighed by individual consumer -Patient is satisfied with current therapy and denies issues -Recommended to continue current medication  Patient Goals/Self-Care Activities Patient will:  - take medications as prescribed check blood pressure daily, document, and provide at future appointments target a minimum of 150 minutes of moderate intensity exercise weekly  Follow Up Plan: Telephone follow up appointment with care management team member scheduled for: 6 months       Patient verbalizes  understanding of instructions and care plan provided today and agrees to view in [place]. Active MyChart status confirmed with patient.   Telephone follow up appointment with pharmacy team member scheduled for: 6 months  Viona Gilmore, [facility]

## 2021-04-27 ENCOUNTER — Encounter (HOSPITAL_COMMUNITY)
Admission: RE | Admit: 2021-04-27 | Discharge: 2021-04-27 | Disposition: A | Discharge status: Home / Self Care | Payer: Medicare Other | Source: Ambulatory Visit | Attending: Anesthesiology | Admitting: Anesthesiology

## 2021-04-27 DIAGNOSIS — E119 Type 2 diabetes mellitus without complications: Secondary | ICD-10-CM

## 2021-04-27 DIAGNOSIS — I10 Essential (primary) hypertension: Secondary | ICD-10-CM

## 2021-04-28 DIAGNOSIS — N6321 Unspecified lump in the left breast, upper outer quadrant: Secondary | ICD-10-CM | POA: Diagnosis not present

## 2021-04-28 DIAGNOSIS — N6320 Unspecified lump in the left breast, unspecified quadrant: Secondary | ICD-10-CM | POA: Diagnosis not present

## 2021-04-28 DIAGNOSIS — N6323 Unspecified lump in the left breast, lower outer quadrant: Secondary | ICD-10-CM | POA: Diagnosis not present

## 2021-05-02 NOTE — Patient Instructions (Addendum)
DUE TO COVID-19 ONLY ONE VISITOR  (aged 64 and older)  IS ALLOWED TO COME WITH YOU AND STAY IN THE WAITING ROOM ONLY DURING PRE OP AND PROCEDURE.   ?**NO VISITORS ARE ALLOWED IN THE SHORT STAY AREA OR RECOVERY ROOM!!** ? ?IF YOU WILL BE ADMITTED INTO THE HOSPITAL YOU ARE ALLOWED ONLY TWO SUPPORT PEOPLE DURING VISITATION HOURS ONLY (7 AM -8PM)   ?The support person(s) must pass our screening, gel in and out, and wear a mask at all times, including in the patient?s room. ?Patients must also wear a mask when staff or their support person are in the room. ?Visitors GUEST BADGE MUST BE WORN VISIBLY  ?One adult visitor may remain with you overnight and MUST be in the room by 8 P.M. ?      ? Your procedure is scheduled on: 05/12/21 ? ? Report to [facility] Main Entrance ? ?  Report to admitting at 5:15 AM ? ? Call this number if you have problems the morning of surgery [phone] ? ? Do not eat food :After Midnight. ? ? After Midnight you may have the following liquids until 4:30 AM DAY OF SURGERY ? ?Water ?Black Coffee (sugar ok, NO MILK/CREAM OR CREAMERS)  ?Tea (sugar ok, NO MILK/CREAM OR CREAMERS) regular and decaf                             ?Plain Jell-O (NO RED)                                           ?Fruit ices (not with fruit pulp, NO RED)                                     ?Popsicles (NO RED)                                                                  ?Juice: apple, WHITE grape, WHITE cranberry ?Sports drinks like Gatorade (NO RED) ?Clear broth(vegetable,chicken,beef) ? ? ?FOLLOW BOWEL PREP AND ANY ADDITIONAL PRE OP INSTRUCTIONS YOU RECEIVED FROM YOUR SURGEON'S OFFICE!!! ?  ?  ?Oral Hygiene is also important to reduce your risk of infection.                                    ?Remember - BRUSH YOUR TEETH THE MORNING OF SURGERY WITH YOUR REGULAR TOOTHPASTE ? ? Take these medicines the morning of surgery with A SIP OF WATER: Tylenol, Clonazepam, Xyzal, Metoprolol, Compazine, Inhalers, Eyedrops ?                   ?           You may not have any metal on your body including hair pins, jewelry, and body piercing ? ?           Do not wear make-up, lotions, powders, perfumes, or deodorant ? ?Do not wear nail polish including gel and  S&S, artificial/acrylic nails, or any other type of covering on natural nails including finger and toenails. If you have artificial nails, gel coating, etc. that needs to be removed by a nail salon please have this removed prior to surgery or surgery may need to be canceled/ delayed if the surgeon/ anesthesia feels like they are unable to be safely monitored.  ? ?Do not shave  48 hours prior to surgery.  ? ? Do not bring valuables to the hospital. [facility] NOT ?            RESPONSIBLE   FOR VALUABLES. ? ? Contacts, dentures or bridgework may not be worn into surgery. ? ? Bring small overnight bag day of surgery. ? ?            Please read over the following fact sheets you were given: IF [place] [phone]- Apolonio Schneiders ? ?   [facility] - Preparing for Surgery ?Before surgery, you can play an important role.  Because skin is not sterile, your skin needs to be as free of germs as possible.  You can reduce the number of germs on your skin by washing with CHG (chlorahexidine gluconate) soap before surgery.  CHG is an antiseptic cleaner which kills germs and bonds with the skin to continue killing germs even after washing. ?Please DO NOT use if you have an allergy to CHG or antibacterial soaps.  If your skin becomes reddened/irritated stop using the CHG and inform your nurse when you arrive at Short Stay. ?Do not shave (including legs and underarms) for at least 48 hours prior to the first CHG shower.  You may shave your face/neck. ? ?Please follow these instructions carefully: ? 1.  Shower with CHG Soap the night before surgery and the  morning of surgery. ? 2.  If you choose to wash your hair, wash your hair first as usual with  your normal  shampoo. ? 3.  After you shampoo, rinse your hair and body thoroughly to remove the shampoo.                            ? 4.  Use CHG as you would any other liquid soap.  You can apply chg directly to the skin and wash.  Gently with a scrungie or clean washcloth. ? 5.  Apply the CHG Soap to your body ONLY FROM THE NECK DOWN.   Do   not use on face/ open      ?                     Wound or open sores. Avoid contact with eyes, ears mouth and   genitals (private parts).  ?                     Production manager,  Genitals (private parts) with your normal soap. ?            6.  Wash thoroughly, paying special attention to the area where your    surgery  will be performed. ? 7.  Thoroughly rinse your body with warm water from the neck down. ? 8.  DO NOT shower/wash with your normal soap after using and rinsing off the CHG Soap. ?               9.  Pat yourself dry with a clean towel. ?  10.  Wear clean pajamas. ?           11.  Place clean sheets on your bed the night of your first shower and do not  sleep with pets. ?Day of Surgery : ?Do not apply any lotions/deodorants the morning of surgery.  Please wear clean clothes to the hospital/surgery center. ? ?FAILURE TO FOLLOW THESE INSTRUCTIONS MAY RESULT IN THE CANCELLATION OF YOUR SURGERY ? ?PATIENT SIGNATURE_________________________________ ? ?NURSE SIGNATURE__________________________________ ? ?________________________________________________________________________  ?

## 2021-05-03 ENCOUNTER — Encounter (HOSPITAL_COMMUNITY): Payer: Medicare Other

## 2021-05-04 ENCOUNTER — Encounter (HOSPITAL_COMMUNITY)
Admission: RE | Admit: 2021-05-04 | Discharge: 2021-05-04 | Disposition: A | Discharge status: Home / Self Care | Payer: Medicare Other | Source: Ambulatory Visit | Attending: Surgery | Admitting: Surgery

## 2021-05-04 ENCOUNTER — Other Ambulatory Visit: Payer: Self-pay

## 2021-05-04 ENCOUNTER — Encounter (HOSPITAL_COMMUNITY): Payer: Self-pay

## 2021-05-04 DIAGNOSIS — E119 Type 2 diabetes mellitus without complications: Secondary | ICD-10-CM

## 2021-05-04 DIAGNOSIS — Z01818 Encounter for other preprocedural examination: Secondary | ICD-10-CM | POA: Diagnosis not present

## 2021-05-04 DIAGNOSIS — I10 Essential (primary) hypertension: Secondary | ICD-10-CM

## 2021-05-04 DIAGNOSIS — Z79899 Other long term (current) drug therapy: Secondary | ICD-10-CM | POA: Insufficient documentation

## 2021-05-04 LAB — CBC
HCT: 41.8 % (ref 36.0–46.0)
Hemoglobin: 13.8 g/dL (ref 12.0–15.0)
MCH: 29.4 pg (ref 26.0–34.0)
MCHC: 33 g/dL (ref 30.0–36.0)
MCV: 89.1 fL (ref 80.0–100.0)
Platelets: 285 10*3/uL (ref 150–400)
RBC: 4.69 MIL/uL (ref 3.87–5.11)
RDW: 12.5 % (ref 11.5–15.5)
WBC: 7.3 10*3/uL (ref 4.0–10.5)
nRBC: 0 % (ref 0.0–0.2)

## 2021-05-04 LAB — HEMOGLOBIN A1C
Hgb A1c MFr Bld: 5 % (ref 4.8–5.6)
Mean Plasma Glucose: 96.8 mg/dL

## 2021-05-04 LAB — BASIC METABOLIC PANEL
Anion gap: 6 (ref 5–15)
BUN: 14 mg/dL (ref 8–23)
CO2: 28 mmol/L (ref 22–32)
Calcium: 9.2 mg/dL (ref 8.9–10.3)
Chloride: 104 mmol/L (ref 98–111)
Creatinine, Ser: 0.65 mg/dL (ref 0.44–1.00)
GFR, Estimated: >60 mL/min (ref >60)
Glucose, Bld: 83 mg/dL (ref 70–99)
Potassium: 3.8 mmol/L (ref 3.5–5.1)
Sodium: 138 mmol/L (ref 135–145)

## 2021-05-04 LAB — GLUCOSE, CAPILLARY: Glucose-Capillary: 110 mg/dL — ABNORMAL HIGH (ref 70–99)

## 2021-05-04 NOTE — Progress Notes (Addendum)
COVID swab appointment: Pos Covid 03-11-21 ?  ?COVID Vaccine Completed: Yes x1  ?Date COVID Vaccine completed:  05-01-19 ?Has received booster: ?COVID vaccine manufacturer: [employer]  ?  ?Date of COVID positive in last 90 days: Yes, +Covid 03-11-21 ?  ?PCP - [name], MD ?Cardiologist - Virl Axe, MD ?  ?Chest x-ray - CT chest 01-12-21 [facility] ?EKG - 05/04/21 Epic/chart ?Stress Test - n/a ?ECHO - 05-31-20 Epic ?Cardiac Cath - n/a ?Pacemaker/ICD device last checked: 04-12-20 Epic ?Spinal Cord Stimulator: ?  ?Bowel Prep - N/A ?  ?Sleep Study - n/a ?CPAP -  ?  ?Fasting Blood Sugar - 80-94 ?Checks Blood Sugar every couple days ?  ?Blood Thinner Instructions: n/a ?Aspirin Instructions: ?Last Dose: ?  ?Activity level:   Can go up a flight of stairs and perform activities of daily living without stopping and without symptoms of chest pain or shortness of breath. ?                         ?Anesthesia review: Afib, atrial tachycardia, hx of CVA. Sinus node dysfunction, dysautonomia ?  ?Patient denies shortness of breath, fever, cough and chest pain at PAT appointment ?  ?  ?Patient verbalized understanding of instructions that were given to them at the PAT appointment. Patient was also instructed that they will need to review over the PAT instructions again at home before surgery.  ?

## 2021-05-11 ENCOUNTER — Ambulatory Visit (INDEPENDENT_AMBULATORY_CARE_PROVIDER_SITE_OTHER): Payer: Medicare Other | Admitting: Adult Health

## 2021-05-11 VITALS — BP 120/78 | HR 64 | Temp 97.6°F | Ht 63.0 in | Wt 143.0 lb

## 2021-05-11 DIAGNOSIS — J22 Unspecified acute lower respiratory infection: Secondary | ICD-10-CM

## 2021-05-11 LAB — POCT INFLUENZA A/B
Influenza A, POC: NEGATIVE
Influenza B, POC: NEGATIVE

## 2021-05-11 LAB — POCT RAPID STREP A (OFFICE): Rapid Strep A Screen: NEGATIVE

## 2021-05-11 LAB — POC COVID19 BINAXNOW: SARS Coronavirus 2 Ag: NEGATIVE

## 2021-05-11 MED ORDER — BENZONATATE 200 MG PO CAPS: 200.0000 mg | ORAL_CAPSULE | Freq: Two times a day (BID) | ORAL | 0 refills | Status: DC | PRN

## 2021-05-11 MED ORDER — PREDNISONE 10 MG PO TABS: ORAL_TABLET | ORAL | 0 refills | Status: DC

## 2021-05-11 MED ORDER — CEFUROXIME AXETIL 500 MG PO TABS: 500.0000 mg | ORAL_TABLET | Freq: Two times a day (BID) | ORAL | 0 refills | Status: AC

## 2021-05-11 NOTE — Progress Notes (Signed)
Subjective:    Patient ID: [name], female    DOB: Jul 02, 1955, 66 y.o.   MRN: [id number]  HPI  66 year old female who  has a past medical history of Anemia, Angio-edema, Anxiety, Arthritis, Asthma, Atrial fibrillation (HCC), Atrial tachycardia (HCC), Autonomic dysfunction, Cervicalgia, Complication of anesthesia, CVA (cerebral infarction), Disorder of bone and cartilage, unspecified, Disturbance of skin sensation, Diverticulosis, DM (diabetes mellitus) (HCC), DVT (deep venous thrombosis) (HCC), DVT (deep venous thrombosis) (HCC), Eczema, Family history of anesthesia complication, Fatty liver, GERD (gastroesophageal reflux disease), History of hiatal hernia, History of pacemaker, History of radiation therapy (10/24/2012), HLD (hyperlipidemia), HTN (hypertension), Hypoglycemia, unspecified, Injury to unspecified nerve of shoulder girdle and upper limb, Liver metastasis (HCC), Lung metastasis (HCC), Metastasis to brain (HCC), Osteopenia, Other nonspecific abnormal serum enzyme levels, Other specified congenital anomalies of nervous system, Pacemaker, Pancreatitis, Peripheral vascular disease (HCC), PONV (postoperative nausea and vomiting), POTS (postural orthostatic tachycardia syndrome), Recurrent upper respiratory infection (URI), Skin cancer, Stroke (HCC), Swelling of limb, and Urticaria.  She presents to the office today for an acute complaint  Her symptoms started roughly 1 week ago.  Symptoms include productive cough with wheezing and shortness of breath.  Cough is worse at night and in the morning.  Cough is keeping her awake at night.  She denies sinus pain or pressure, no fevers, or chills.  He has not been able to use her albuterol inhaler that she uses as needed for asthma due to it being on backorder   Review of Systems See HPI   Past Medical History:  Diagnosis Date   Anemia    Angio-edema    Anxiety    Arthritis    Asthma    Atrial fibrillation (HCC)    Atrial  tachycardia (HCC)    Autonomic dysfunction    Cervicalgia    Complication of anesthesia    pt states wakes up with "shakes"   CVA (cerebral infarction)    2012 with dizziness and vision change felt embolic from atrial tachy   Disorder of bone and cartilage, unspecified    Disturbance of skin sensation    Diverticulosis    DM (diabetes mellitus) (HCC)    DVT (deep venous thrombosis) (HCC)    DVT (deep venous thrombosis) (HCC)    Eczema    Family history of anesthesia complication    PONV and " shaking "   Fatty liver    GERD (gastroesophageal reflux disease)    History of hiatal hernia    History of pacemaker    History of radiation therapy 10/24/2012   brain   HLD (hyperlipidemia)    HTN (hypertension)    Hypoglycemia, unspecified    Injury to unspecified nerve of shoulder girdle and upper limb    Liver metastasis (HCC)    Lung metastasis (HCC)    Metastasis to brain (HCC)    Osteopenia    Other nonspecific abnormal serum enzyme levels    Other specified congenital anomalies of nervous system    Pacemaker    autonomic dysfunction   Pancreatitis    from therapy   Peripheral vascular disease (HCC)    PONV (postoperative nausea and vomiting)    POTS (postural orthostatic tachycardia syndrome)    Recurrent upper respiratory infection (URI)    Skin cancer    Hx: of lung lesion   Stroke (HCC)    left weaker    Swelling of limb    Urticaria     Social History  Socioeconomic History   Marital status: Married    Spouse name: Not on file   Number of children: 0   Years of education: Not on file   Highest education level: Not on file  Occupational History   Occupation: PRESIDENT    Employer: [employer]    Comment: self employed  Tobacco Use   Smoking status: Never   Smokeless tobacco: Never  Vaping Use   Vaping Use: Never used  Substance and Sexual Activity   Alcohol use: Never    Comment: glass of wine daily   Drug use: No   Sexual activity: Not  on file  Other Topics Concern   Not on file  Social History Narrative   4 years college hh of 2    Neg tad   Social Determinants of Health   Financial Resource Strain: Low Risk    Difficulty of Paying Living Expenses: Not hard at all  Food Insecurity: No Food Insecurity   Worried About Programme researcher, broadcasting/film/video in the Last Year: Never true   Ran Out of Food in the Last Year: Never true  Transportation Needs: No Transportation Needs   Lack of Transportation (Medical): No   Lack of Transportation (Non-Medical): No  Physical Activity: Sufficiently Active   Days of Exercise per Week: 4 days   Minutes of Exercise per Session: 90 min  Stress: No Stress Concern Present   Feeling of Stress : Not at all  Social Connections: Socially Integrated   Frequency of Communication with Friends and Family: More than three times a week   Frequency of Social Gatherings with Friends and Family: More than three times a week   Attends Religious Services: More than 4 times per year   Active Member of Clubs or Organizations: Yes   Attends Engineer, structural: More than 4 times per year   Marital Status: Married  Catering manager Violence: Not At Risk   Fear of Current or Ex-Partner: No   Emotionally Abused: No   Physically Abused: No   Sexually Abused: No    Past Surgical History:  Procedure Laterality Date   ABLATION SAPHENOUS VEIN W/ RFA     2002 x3   ADENOIDECTOMY     CARPAL TUNNEL RELEASE Left    CHOLECYSTECTOMY  03/28/2018   CHOLECYSTECTOMY N/A 03/28/2018   Procedure: LAPAROSCOPIC CHOLECYSTECTOMY WITH INTRAOPERATIVE CHOLANGIOGRAM;  Surgeon: Luretha Murphy, MD;  Location: [facility] OR;  Service: General;  Laterality: N/A;   CRANIOTOMY N/A 09/30/2012   suboccipital craniectomy   CRANIOTOMY Left 02/09/2013   Procedure: LEFT PARIETAL CRANIOTOMY with stealth;  Surgeon: Barnett Abu, MD;  Location: [facility];  Service: Neurosurgery;  Laterality: Left;  LEFT Parietal Craniotomy for tumor with  stealth   DEEP AXILLARY SENTINEL NODE BIOPSY / EXCISION     due to extensive Melanoma-right arm   fatty tumor removed     from chest   INSERT / REPLACE / REMOVE PACEMAKER  2012   1999, x 3   LAPAROSCOPY  09/06/2011   Procedure: LAPAROSCOPY OPERATIVE;  Surgeon: Tresa Endo A. Ernestina Penna, MD;  Location: [facility] ORS;  Service: Gynecology;  Laterality: Left;  with Left Ovarian Cystectomy    MELANOMA EXCISION     with removal of lymph nodes, left shoulder   NOSE SURGERY  2010, 2012   for nose bleeds x 2   PACEMAKER IMPLANT     SINOSCOPY     TONSILLECTOMY AND ADENOIDECTOMY      Family History  Problem Relation Age of Onset   Allergic rhinitis Sister    Stroke Mother    Colon polyps Mother    Colon cancer Mother    Urticaria Mother    Allergic rhinitis Mother    Heart disease Father    Diabetes Father    Kidney disease Father    Diabetes Maternal Grandmother    Clotting disorder Maternal Grandmother        stroke   Crohn's disease Maternal Grandmother    Diabetes Paternal Grandmother    Aneurysm Sister        brain    Allergies  Allergen Reactions   Bee Venom Swelling   Doxycycline Hives   Erythromycin Hives   Gadavist [Gadobutrol] Hives, Shortness Of Breath and Itching   Paxlovid [Nirmatrelvir-Ritonavir] Other (See Comments)    Mouth swelling, mouth ulcers   Penicillins Hives    Did it involve swelling of the face/tongue/throat, SOB, or low BP? No Did it involve sudden or severe rash/hives, skin peeling, or any reaction on the inside of your mouth or nose? Yes Did you need to seek medical attention at a hospital or doctor's office? Yes When did it last happen?      5+ years If all above answers are NO, may proceed with cephalosporin use.    Sulfa Antibiotics Hives   Preparation H [Pramox-Pe-Glycerin-Petrolatum] Hives    Cannot use  Cream or Suppositories    Phenergan [Promethazine Hcl] Other (See Comments)    Causes restless leg syndrome    Current Outpatient Medications  on File Prior to Visit  Medication Sig Dispense Refill   acetaminophen (TYLENOL) 500 MG tablet Take 500 mg by mouth every 6 (six) hours as needed for moderate pain.     albuterol (PROVENTIL) (2.5 MG/3ML) 0.083% nebulizer solution Take 3 mLs (2.5 mg total) by nebulization every 4 (four) hours as needed for wheezing or shortness of breath. 180 mL 1   albuterol (VENTOLIN HFA) 108 (90 Base) MCG/ACT inhaler INHALE 2 PUFFS INTO THE LUNGS EVERY 4 (FOUR) HOURS AS NEEDED FOR WHEEZING OR SHORTNESS OF BREATH. 18 Inhaler 0   augmented betamethasone dipropionate (DIPROLENE) 0.05 % ointment Apply topically 2 (two) times daily. Limit to no longer than 2 weeks use. Not on face. 50 g 0   calcium carbonate (TUMS - DOSED IN MG ELEMENTAL CALCIUM) 500 MG chewable tablet Chew 2 tablets by mouth daily as needed for indigestion or heartburn.     CALCIUM PO Take 1 tablet by mouth daily.     cholestyramine (QUESTRAN) 4 g packet Take 4 g by mouth 2 (two) times daily.     clobetasol cream (TEMOVATE) 0.05 % Apply 1 application topically 2 (two) times daily as needed (rash).     clonazePAM (KLONOPIN) 1 MG tablet TAKE 1 TABLET BY MOUTH TWICE A DAY. MAY TAKE UP TO 3 TIMES DAILY IF NEEDED FOR EXTRA ANXIETY 70 tablet 0   cloNIDine (CATAPRES) 0.1 MG tablet Take 1 tablet (0.1 mg total) by mouth at bedtime. 90 tablet 3   cyanocobalamin (,VITAMIN B-12,) 1000 MCG/ML injection Inject 1 mL (1,000 mcg total) into the muscle every 30 (thirty) days. 1 mL 12   Desoximetasone (TOPICORT) 0.25 % ointment Apply 1 application topically 2 (two) times daily. (Patient taking differently: Apply 1 application topically 2 (two) times daily as needed (rash).) 30 g 1   diphenhydrAMINE (BENADRYL) 25 MG tablet Take 25 mg by mouth daily as needed for allergies.     EPINEPHrine 0.3 mg/0.3  mL IJ SOAJ injection Inject 0.3 mg into the muscle as needed for anaphylaxis.     fluorouracil (EFUDEX) 5 % cream Apply 1 application topically 2 (two) times daily as needed  (rash).     fluticasone (FLONASE) 50 MCG/ACT nasal spray Place 2 sprays into both nostrils daily. 16 g 0   fluticasone (FLOVENT HFA) 44 MCG/ACT inhaler Inhale 2 puffs into the lungs 2 (two) times daily as needed (shortness of breath).     hyoscyamine (LEVSIN SL) 0.125 MG SL tablet Place 0.125 mg under the tongue every 6 (six) hours as needed for cramping.     levETIRAcetam (KEPPRA) 250 MG tablet Take 250 mg by mouth See admin instructions. Take 250mg  three times a day at 6am, 12pm, 6pm.     levocetirizine (XYZAL) 5 MG tablet TAKE 1 TABLET BY MOUTH EVERY DAY AS NEEDED (Patient taking differently: Take 5 mg by mouth daily as needed for allergies.) 30 tablet 0   lidocaine (XYLOCAINE) 2 % solution 5mL swish and swallow as needed for sore throat; take with nystatin susp 200 mL 0   lipase/protease/amylase (CREON) 12000 units CPEP capsule Take 12,000 Units by mouth daily with breakfast.     metoprolol tartrate (LOPRESSOR) 25 MG tablet Take 1 tablet (25 mg total) by mouth 2 (two) times daily. 180 tablet 3   nystatin (MYCOSTATIN) 100000 UNIT/ML suspension Use as directed 5 mLs (500,000 Units total) in the mouth or throat 3 (three) times daily as needed (thrush). 60 mL 2   Olopatadine HCl (PATADAY) 0.2 % SOLN Use one drop in each eye once daily as needed. (Patient taking differently: Place 1 drop into both eyes daily as needed (itchy eyes).) 2.5 mL 5   polyethylene glycol (MIRALAX / GLYCOLAX) packet Take 17 g by mouth at bedtime.     potassium chloride SA (K-DUR,KLOR-CON) 20 MEQ tablet Take 1 tablet (20 mEq total) by mouth 2 (two) times daily. 60 tablet 3   prochlorperazine (COMPAZINE) 10 MG tablet Take 1 tablet (10 mg total) by mouth every 6 (six) hours as needed for nausea or vomiting. 60 tablet 0   sodium chloride (OCEAN) 0.65 % SOLN nasal spray Place 1 spray into both nostrils as needed for congestion.      Spacer/Aero-Holding Chambers [facility] DIAMOND) MISC See admin instructions.     Syringe/Needle,  Disp, (SYRINGE 3CC/27GX1-1/4") 27G X 1-1/4" 3 ML MISC Inject 1 mL into the muscle every 30 (thirty) days. Vit B12 12 each 0   Vitamin D, Ergocalciferol, (DRISDOL) 50000 units CAPS capsule TAKE 1 CAPSULE (50,000 UNITS TOTAL) BY MOUTH EVERY 7 (SEVEN) DAYS. 4 capsule 11   No current facility-administered medications on file prior to visit.    Pulse 64   Temp 97.6 F (36.4 C) (Oral)   Ht 5\' 3"  (1.6 m)   Wt 143 lb (64.9 kg)   SpO2 100%   BMI 25.33 kg/m       Objective:   Physical Exam Vitals and nursing note reviewed.  Constitutional:      Appearance: Normal appearance.  HENT:     Nose: Nose normal. No congestion or rhinorrhea.     Mouth/Throat:     Mouth: Mucous membranes are moist.     Pharynx: Oropharynx is clear. No oropharyngeal exudate.  Cardiovascular:     Rate and Rhythm: Normal rate and regular rhythm.     Pulses: Normal pulses.     Heart sounds: Normal heart sounds.  Pulmonary:     Effort: Pulmonary effort  is normal.     Breath sounds: Wheezing and rhonchi present.  Abdominal:     General: Abdomen is flat. Bowel sounds are normal.     Palpations: Abdomen is soft.  Skin:    General: Skin is warm and dry.  Neurological:     General: No focal deficit present.     Mental Status: She is alert and oriented to person, place, and time.  Psychiatric:        Mood and Affect: Mood normal.        Behavior: Behavior normal.        Thought Content: Thought content normal.        Judgment: Judgment normal.      Assessment & Plan:  1. Acute respiratory infection  - POC Rapid Strep A- negative - POC Influenza A/B- negative - POC COVID-19- negative  Due to symptoms and medical history we will treat for respiratory infection with Ceftin (she is allergic to penicillins, sulfa, Doxy, and erythromycin) she has had cephalosporins in the past without any issues.  - cefUROXime (CEFTIN) 500 MG tablet; Take 1 tablet (500 mg total) by mouth 2 (two) times daily with a meal for 10  days.  Dispense: 14 tablet; Refill: 0 - predniSONE (DELTASONE) 10 MG tablet; 40 mg x 3 days, 20 mg x 3 days, 10 mg x 3 days  Dispense: 21 tablet; Refill: 0 - benzonatate (TESSALON) 200 MG capsule; Take 1 capsule (200 mg total) by mouth 2 (two) times daily as needed for cough.  Dispense: 20 capsule; Refill: 0  Shirline Frees, NP

## 2021-05-12 ENCOUNTER — Ambulatory Visit (HOSPITAL_COMMUNITY): Admission: RE | Admit: 2021-05-12 | Payer: Medicare Other | Source: Home / Self Care | Admitting: Surgery

## 2021-05-12 ENCOUNTER — Encounter (HOSPITAL_COMMUNITY): Admission: RE | Payer: Self-pay | Source: Home / Self Care

## 2021-05-12 DIAGNOSIS — E119 Type 2 diabetes mellitus without complications: Secondary | ICD-10-CM

## 2021-05-12 SURGERY — REPAIR, HERNIA, HIATAL, ROBOT-ASSISTED
Anesthesia: General

## 2021-05-17 ENCOUNTER — Telehealth: Payer: Self-pay | Admitting: Internal Medicine

## 2021-05-17 ENCOUNTER — Encounter: Payer: Self-pay | Admitting: Internal Medicine

## 2021-05-17 NOTE — Telephone Encounter (Signed)
Pt advised and will keep her follow up with Dr Caryl Comes 06/2021.  ?

## 2021-05-17 NOTE — Telephone Encounter (Signed)
Yes fluid retention can be a common side effect of steroids because they can cause sodium and water retention. She should try to restrict salt in her diet while on steroids ?

## 2021-05-17 NOTE — Telephone Encounter (Signed)
Pt called to report that she is having some increased peripheral edema since being on steroids for her respiratory infection. She has had some edema up to her ankles in both of her feet. She is not having any increased SOB... she says her hands have also been puffy. She has 5 more days left. I have asked her to try and elevate her legs every time that she sits down to see if that gives her some relief. She can still wear her normal shoes but they have been "tight".  ? ?She also says she had a "reaction"  when having an MRI recently which caused her hands and ears to turn purple and she has since been diagnosed with possible Raynaud's Syndrome.  ? ?I will forward to the PharmD for review and any further recommendations re: the Steroids and if can be her cause for the swelling.  ? ? ? ? ?

## 2021-05-17 NOTE — Telephone Encounter (Signed)
Pt c/o swelling: STAT is pt has developed SOB within 24 hours ? ?If swelling, where is the swelling located? Feet and hands  ? ?How much weight have you gained and in what time span? Patient gained 3 lbs overnight from Saturday to Sunday  ? ?Have you gained 3 pounds in a day or 5 pounds in a week?  ? ?Do you have a log of your daily weights (if so, list)? Patient said she weighs herself daily but she did not give those readings  ? ?Are you currently taking a fluid pill? no ? ?Are you currently SOB? Yes  ? ?Have you traveled recently? no ? ?Patient also had an Upper Respiratory Information and was given steroids and an antibiotic which  ? ? ?Patient wanted to have D. Caryl Comes look at the EKG and Echo she had done at [facility] as part of a clearance for a surgery that has since been postponed. It came up abnormal and she needs to be cleared by Cardiology before having surgery ?

## 2021-05-19 DIAGNOSIS — E119 Type 2 diabetes mellitus without complications: Secondary | ICD-10-CM

## 2021-05-26 DIAGNOSIS — I10 Essential (primary) hypertension: Secondary | ICD-10-CM | POA: Diagnosis not present

## 2021-05-26 DIAGNOSIS — J45909 Unspecified asthma, uncomplicated: Secondary | ICD-10-CM | POA: Diagnosis not present

## 2021-05-26 DIAGNOSIS — F32A Depression, unspecified: Secondary | ICD-10-CM

## 2021-05-29 ENCOUNTER — Other Ambulatory Visit: Payer: Self-pay | Admitting: Internal Medicine

## 2021-05-29 ENCOUNTER — Other Ambulatory Visit: Payer: Self-pay | Admitting: Adult Health

## 2021-05-30 ENCOUNTER — Telehealth: Payer: Self-pay | Admitting: Internal Medicine

## 2021-05-30 NOTE — Telephone Encounter (Signed)
Patient called in stating that the clonazePAM (KLONOPIN) 1 MG tablet [[id number]]  medication was denied. She stated that she have to have this medication due to her having two brain surgery. Patient is down to her last two pills. ? ?Please advise. ?

## 2021-05-30 NOTE — Telephone Encounter (Signed)
Patient is also requesting a phone call back regarding why the medication was denied. ?

## 2021-05-30 NOTE — Telephone Encounter (Signed)
Last refill per controlled substance database: 03/23/21 ?Last OV: 03/23/21 with PCP & 05/11/21 acute ?Next OV: none scheduled ?

## 2021-05-30 NOTE — Telephone Encounter (Signed)
Explained to pt that we already had a request from her pharmacy that was pending approval by a provider. Since she requested it again, it was denied, b/c it was being filled from another request. Pt verb understanding.  ?Verified with her that medication was refilled to [facility] on [address] at 0940. ?

## 2021-06-05 DIAGNOSIS — Z9289 Personal history of other medical treatment: Secondary | ICD-10-CM | POA: Diagnosis not present

## 2021-06-05 DIAGNOSIS — C7800 Secondary malignant neoplasm of unspecified lung: Secondary | ICD-10-CM | POA: Diagnosis not present

## 2021-06-05 DIAGNOSIS — J45909 Unspecified asthma, uncomplicated: Secondary | ICD-10-CM | POA: Diagnosis not present

## 2021-06-05 DIAGNOSIS — K769 Liver disease, unspecified: Secondary | ICD-10-CM | POA: Diagnosis not present

## 2021-06-05 DIAGNOSIS — Z79899 Other long term (current) drug therapy: Secondary | ICD-10-CM | POA: Diagnosis not present

## 2021-06-05 DIAGNOSIS — C439 Malignant melanoma of skin, unspecified: Secondary | ICD-10-CM | POA: Diagnosis not present

## 2021-06-05 DIAGNOSIS — R590 Localized enlarged lymph nodes: Secondary | ICD-10-CM | POA: Diagnosis not present

## 2021-06-05 DIAGNOSIS — Z950 Presence of cardiac pacemaker: Secondary | ICD-10-CM | POA: Diagnosis not present

## 2021-06-05 DIAGNOSIS — R509 Fever, unspecified: Secondary | ICD-10-CM | POA: Diagnosis not present

## 2021-06-05 DIAGNOSIS — Z800 Family history of malignant neoplasm of digestive organs: Secondary | ICD-10-CM | POA: Diagnosis not present

## 2021-06-05 DIAGNOSIS — Z85820 Personal history of malignant melanoma of skin: Secondary | ICD-10-CM | POA: Diagnosis not present

## 2021-06-05 DIAGNOSIS — I4891 Unspecified atrial fibrillation: Secondary | ICD-10-CM | POA: Diagnosis not present

## 2021-06-05 DIAGNOSIS — G4485 Primary stabbing headache: Secondary | ICD-10-CM | POA: Diagnosis not present

## 2021-06-05 DIAGNOSIS — C787 Secondary malignant neoplasm of liver and intrahepatic bile duct: Secondary | ICD-10-CM | POA: Diagnosis not present

## 2021-06-14 DIAGNOSIS — H43811 Vitreous degeneration, right eye: Secondary | ICD-10-CM | POA: Diagnosis not present

## 2021-06-22 DIAGNOSIS — G5603 Carpal tunnel syndrome, bilateral upper limbs: Secondary | ICD-10-CM | POA: Diagnosis not present

## 2021-06-22 DIAGNOSIS — I7300 Raynaud's syndrome without gangrene: Secondary | ICD-10-CM | POA: Diagnosis not present

## 2021-06-22 DIAGNOSIS — G5763 Lesion of plantar nerve, bilateral lower limbs: Secondary | ICD-10-CM | POA: Diagnosis not present

## 2021-06-22 DIAGNOSIS — Z9289 Personal history of other medical treatment: Secondary | ICD-10-CM | POA: Diagnosis not present

## 2021-06-27 ENCOUNTER — Encounter: Payer: Self-pay | Admitting: Internal Medicine

## 2021-06-27 ENCOUNTER — Ambulatory Visit: Payer: Medicare Other | Admitting: Internal Medicine

## 2021-06-27 VITALS — BP 116/80 | HR 64 | Ht 63.0 in | Wt 145.0 lb

## 2021-06-27 DIAGNOSIS — Z950 Presence of cardiac pacemaker: Secondary | ICD-10-CM

## 2021-06-27 DIAGNOSIS — I495 Sick sinus syndrome: Secondary | ICD-10-CM

## 2021-06-27 DIAGNOSIS — I471 Supraventricular tachycardia: Secondary | ICD-10-CM | POA: Diagnosis not present

## 2021-06-27 DIAGNOSIS — I10 Essential (primary) hypertension: Secondary | ICD-10-CM | POA: Diagnosis not present

## 2021-06-27 DIAGNOSIS — G901 Familial dysautonomia [Riley-Day]: Secondary | ICD-10-CM

## 2021-06-27 MED ORDER — LABETALOL HCL 100 MG PO TABS: 50.0000 mg | ORAL_TABLET | Freq: Two times a day (BID) | ORAL | 3 refills | Status: DC

## 2021-06-27 NOTE — Patient Instructions (Addendum)
Medication Instructions:  ?Your physician has recommended you make the following change in your medication:  ? ?** Stop Metoprolol ? ?** Start Labetolol 100mg  - take 1/2 tablet by mouth twice daily. ? ? ?*If you need a refill on your cardiac medications before your next appointment, please call your pharmacy* ? ? ?Lab Work: ?Fasting Lipid Panel and Direct LDL - Lab appointment scheduled for 07/06/2021.  You may come anytime between 8am and 430pm that day. ? ?If you have labs (blood work) drawn today and your tests are completely normal, you will receive your results only by: ?MyChart Message (if you have MyChart) OR ?A paper copy in the mail ?If you have any lab test that is abnormal or we need to change your treatment, we will call you to review the results. ? ? ?Testing/Procedures: ?None ordered. ? ? ? ?Follow-Up: ?At [facility], you and your health needs are our priority.  As part of our continuing mission to provide you with exceptional heart care, we have created designated Provider Care Teams.  These Care Teams include your primary Cardiologist (physician) and Advanced Practice Providers (APPs -  Physician Assistants and Nurse Practitioners) who all work together to provide you with the care you need, when you need it. ? ?We recommend signing up for the patient portal called "MyChart".  Sign up information is provided on this After Visit Summary.  MyChart is used to connect with patients for Virtual Visits (Telemedicine).  Patients are able to view lab/test results, encounter notes, upcoming appointments, etc.  Non-urgent messages can be sent to your provider as well.   ?To learn more about what you can do with MyChart, go to [email or web address].   ? ?Your next appointment:   ?12 months with Dr Caryl Comes ? ?Important Information About Sugar ? ? ? ? ?  ?

## 2021-06-27 NOTE — Progress Notes (Signed)
Patient Care Team: ?Panosh, Standley Brooking, MD as PCP - General ?[name], MD as Consulting Physician (Neurosurgery) ?[name], MD as Consulting Physician (Oncology) ?Collichio, Joaquim Lai, MD as Referring Physician (Internal Medicine) ?Jodi Marble, MD as Consulting Physician (Otolaryngology) ?Danella Sensing, MD as Consulting Physician (Dermatology) ?Viona Gilmore, [facility] as Pharmacist (Pharmacist) ?Harold Hedge, Darrick Grinder, MD as Consulting Physician (Allergy and Immunology) ? ? ?HPI ? ?[name] is a 66 y.o. female ?Seen in follow-up for dysautonomia as well as pacemaker-Biotronik implanted for dysautonomia and bradycardia for device interrogation Hx of .  She was seen a few weeks ago at the time of her MRI ? ?The patient denies chest pain , shortness of breath, nocturnal dyspnea, orthopnea or peripheral edema.  There have been no syncope.   ? ?Complains of cold hands and feet.  Arterial Dopplers of upper lower extremities were normal; perhaps aggravated by cold.  Difficult warm up.  Somewhat painful. ? ?Increasing problems of late with lightheadedness on abrupt standing. ? ?Bowel function is okay.  Some dry mouth. ? ?  ?    ? ?Past Medical History:  ?Diagnosis Date  ? Anemia   ? Angio-edema   ? Anxiety   ? Arthritis   ? Asthma   ? Atrial fibrillation ([place])   ? Atrial tachycardia ([place])   ? Autonomic dysfunction   ? Cervicalgia   ? Complication of anesthesia   ? pt states wakes up with "shakes"  ? CVA (cerebral infarction)   ? 2012 with dizziness and vision change felt embolic from atrial tachy  ? Disorder of bone and cartilage, unspecified   ? Disturbance of skin sensation   ? Diverticulosis   ? DM (diabetes mellitus) ([place])   ? DVT (deep venous thrombosis) ([place])   ? DVT (deep venous thrombosis) ([place])   ? Eczema   ? Family history of anesthesia complication   ? PONV and " shaking "  ? Fatty liver   ? GERD (gastroesophageal reflux disease)   ? History of hiatal hernia   ? History of pacemaker   ? History of  radiation therapy 10/24/2012  ? brain  ? HLD (hyperlipidemia)   ? HTN (hypertension)   ? Hypoglycemia, unspecified   ? Injury to unspecified nerve of shoulder girdle and upper limb   ? Liver metastasis   ? Lung metastasis   ? Metastasis to brain [facility])   ? Osteopenia   ? Other nonspecific abnormal serum enzyme levels   ? Other specified congenital anomalies of nervous system   ? Pacemaker   ? autonomic dysfunction  ? Pancreatitis   ? from therapy  ? Peripheral vascular disease ([place])   ? PONV (postoperative nausea and vomiting)   ? POTS (postural orthostatic tachycardia syndrome)   ? Recurrent upper respiratory infection (URI)   ? Skin cancer   ? Hx: of lung lesion  ? Stroke [facility])   ? left weaker   ? Swelling of limb   ? Urticaria   ? ? ?Past Surgical History:  ?Procedure Laterality Date  ? ABLATION SAPHENOUS VEIN W/ RFA    ? 2002 x3  ? ADENOIDECTOMY    ? CARPAL TUNNEL RELEASE Left   ? CHOLECYSTECTOMY  03/28/2018  ? CHOLECYSTECTOMY N/A 03/28/2018  ? Procedure: LAPAROSCOPIC CHOLECYSTECTOMY WITH INTRAOPERATIVE CHOLANGIOGRAM;  Surgeon: Johnathan Hausen, MD;  Location: [facility];  Service: General;  Laterality: N/A;  ? CRANIOTOMY N/A 09/30/2012  ? suboccipital craniectomy  ? CRANIOTOMY Left 02/09/2013  ? Procedure: LEFT  PARIETAL CRANIOTOMY with stealth;  Surgeon: [name], MD;  Location: [facility];  Service: Neurosurgery;  Laterality: Left;  LEFT Parietal Craniotomy for tumor with stealth  ? DEEP AXILLARY SENTINEL NODE BIOPSY / EXCISION    ? due to extensive Melanoma-right arm  ? fatty tumor removed    ? from chest  ? INSERT / REPLACE / REMOVE PACEMAKER  2012  ? 1999, x 3  ? LAPAROSCOPY  09/06/2011  ? Procedure: LAPAROSCOPY OPERATIVE;  Surgeon: Floyce Stakes. Pamala Hurry, MD;  Location: [facility];  Service: Gynecology;  Laterality: Left;  with Left Ovarian Cystectomy ?  ? MELANOMA EXCISION    ? with removal of lymph nodes, left shoulder  ? NOSE SURGERY  2010, 2012  ? for nose bleeds x 2  ? PACEMAKER IMPLANT    ? SINOSCOPY    ?  TONSILLECTOMY AND ADENOIDECTOMY    ? ? ?Current Outpatient Medications  ?Medication Sig Dispense Refill  ? acetaminophen (TYLENOL) 500 MG tablet Take 500 mg by mouth every 6 (six) hours as needed for moderate pain.    ? albuterol (PROVENTIL) (2.5 MG/3ML) 0.083% nebulizer solution Take 3 mLs (2.5 mg total) by nebulization every 4 (four) hours as needed for wheezing or shortness of breath. 180 mL 1  ? albuterol (VENTOLIN HFA) 108 (90 Base) MCG/ACT inhaler INHALE 2 PUFFS INTO THE LUNGS EVERY 4 (FOUR) HOURS AS NEEDED FOR WHEEZING OR SHORTNESS OF BREATH. 18 Inhaler 0  ? augmented betamethasone dipropionate (DIPROLENE) 0.05 % ointment Apply topically 2 (two) times daily. Limit to no longer than 2 weeks use. Not on face. 50 g 0  ? benzonatate (TESSALON) 200 MG capsule Take 1 capsule (200 mg total) by mouth 2 (two) times daily as needed for cough. 20 capsule 0  ? budesonide (ENTOCORT EC) 3 MG 24 hr capsule as needed for rash.    ? Calcium Alginate POWD Take 1 tablet by mouth daily.    ? calcium carbonate (TUMS - DOSED IN MG ELEMENTAL CALCIUM) 500 MG chewable tablet Chew 2 tablets by mouth daily as needed for indigestion or heartburn.    ? chlorhexidine (PERIDEX) 0.12 % solution as needed. thrash    ? cholestyramine (QUESTRAN) 4 g packet Take 4 g by mouth 2 (two) times daily.    ? clobetasol cream (TEMOVATE) 8.09 % Apply 1 application topically 2 (two) times daily as needed (rash).    ? clonazePAM (KLONOPIN) 1 MG tablet TAKE 1 TABLET BY MOUTH TWICE A DAY. MAY TAKE UP TO 3 TIMES DAILY IF NEEDED FOR EXTRA ANXIETY 70 tablet 0  ? cloNIDine (CATAPRES) 0.1 MG tablet Take 1 tablet (0.1 mg total) by mouth at bedtime. 90 tablet 3  ? cyanocobalamin (,VITAMIN B-12,) 1000 MCG/ML injection Inject 1 mL (1,000 mcg total) into the muscle every 30 (thirty) days. 1 mL 12  ? Desoximetasone (TOPICORT) 0.25 % ointment Apply 1 application topically 2 (two) times daily. (Patient taking differently: Apply 1 application. topically 2 (two) times  daily as needed (rash).) 30 g 1  ? diphenhydrAMINE (BENADRYL) 25 MG tablet Take 25 mg by mouth daily as needed for allergies.    ? EPINEPHrine 0.3 mg/0.3 mL IJ SOAJ injection Inject 0.3 mg into the muscle as needed for anaphylaxis.    ? fluorouracil (EFUDEX) 5 % cream Apply 1 application topically 2 (two) times daily as needed (rash).    ? fluticasone (FLONASE) 50 MCG/ACT nasal spray Place 2 sprays into both nostrils daily. 16 g 0  ? fluticasone (  FLOVENT HFA) 44 MCG/ACT inhaler Inhale 2 puffs into the lungs 2 (two) times daily as needed (shortness of breath).    ? hydrocortisone (ANUSOL-HC) 25 MG suppository Place rectally as needed.    ? hyoscyamine (LEVSIN SL) 0.125 MG SL tablet Place 0.125 mg under the tongue every 6 (six) hours as needed for cramping.    ? levETIRAcetam (KEPPRA) 250 MG tablet Take 250 mg by mouth See admin instructions. Take 250mg  three times a day at 6am, 12pm, 6pm.    ? levocetirizine (XYZAL) 5 MG tablet TAKE 1 TABLET BY MOUTH EVERY DAY AS NEEDED (Patient taking differently: Take 5 mg by mouth daily as needed for allergies.) 30 tablet 0  ? lidocaine (XYLOCAINE) 2 % solution 89mL swish and swallow as needed for sore throat; take with nystatin susp 200 mL 0  ? lipase/protease/amylase (CREON) 12000 units CPEP capsule Take 12,000 Units by mouth daily with breakfast.    ? metoprolol tartrate (LOPRESSOR) 25 MG tablet Take 1 tablet (25 mg total) by mouth 2 (two) times daily. 180 tablet 3  ? nystatin (MYCOSTATIN) 100000 UNIT/ML suspension Use as directed 5 mLs (500,000 Units total) in the mouth or throat 3 (three) times daily as needed (thrush). 60 mL 2  ? Olopatadine HCl (PATADAY) 0.2 % SOLN Use one drop in each eye once daily as needed. (Patient taking differently: Place 1 drop into both eyes daily as needed (itchy eyes).) 2.5 mL 5  ? ondansetron (ZOFRAN-ODT) 4 MG disintegrating tablet as needed for nausea/vomiting.    ? polyethylene glycol (MIRALAX / GLYCOLAX) packet Take 17 g by mouth at bedtime.     ? potassium chloride SA (K-DUR,KLOR-CON) 20 MEQ tablet Take 1 tablet (20 mEq total) by mouth 2 (two) times daily. 60 tablet 3  ? prochlorperazine (COMPAZINE) 10 MG tablet Take 1 tablet (10 mg total) by mou

## 2021-07-06 ENCOUNTER — Other Ambulatory Visit: Payer: Medicare Other

## 2021-07-07 ENCOUNTER — Other Ambulatory Visit: Payer: Medicare Other | Admitting: *Deleted

## 2021-07-07 DIAGNOSIS — G901 Familial dysautonomia [Riley-Day]: Secondary | ICD-10-CM

## 2021-07-07 DIAGNOSIS — Z950 Presence of cardiac pacemaker: Secondary | ICD-10-CM

## 2021-07-07 DIAGNOSIS — I10 Essential (primary) hypertension: Secondary | ICD-10-CM | POA: Diagnosis not present

## 2021-07-07 DIAGNOSIS — I471 Supraventricular tachycardia: Secondary | ICD-10-CM

## 2021-07-07 DIAGNOSIS — I495 Sick sinus syndrome: Secondary | ICD-10-CM

## 2021-07-07 DIAGNOSIS — C7931 Secondary malignant neoplasm of brain: Secondary | ICD-10-CM | POA: Diagnosis not present

## 2021-07-08 LAB — LIPID PANEL
Chol/HDL Ratio: 4 ratio (ref 0.0–4.4)
Cholesterol, Total: 220 mg/dL — ABNORMAL HIGH (ref 100–199)
HDL: 55 mg/dL (ref >39)
LDL Chol Calc (NIH): 133 mg/dL — ABNORMAL HIGH (ref 0–99)
Triglycerides: 179 mg/dL — ABNORMAL HIGH (ref 0–149)
VLDL Cholesterol Cal: 32 mg/dL (ref 5–40)

## 2021-07-08 LAB — LDL CHOLESTEROL, DIRECT: LDL Direct: 129 mg/dL — ABNORMAL HIGH (ref 0–99)

## 2021-07-10 ENCOUNTER — Other Ambulatory Visit (HOSPITAL_COMMUNITY): Payer: Self-pay | Admitting: Neurological Surgery

## 2021-07-10 ENCOUNTER — Other Ambulatory Visit: Payer: Self-pay | Admitting: Neurological Surgery

## 2021-07-10 DIAGNOSIS — C7931 Secondary malignant neoplasm of brain: Secondary | ICD-10-CM

## 2021-07-13 DIAGNOSIS — C439 Malignant melanoma of skin, unspecified: Secondary | ICD-10-CM | POA: Diagnosis not present

## 2021-07-13 DIAGNOSIS — C7800 Secondary malignant neoplasm of unspecified lung: Secondary | ICD-10-CM | POA: Diagnosis not present

## 2021-07-13 DIAGNOSIS — Z800 Family history of malignant neoplasm of digestive organs: Secondary | ICD-10-CM | POA: Diagnosis not present

## 2021-07-13 DIAGNOSIS — R220 Localized swelling, mass and lump, head: Secondary | ICD-10-CM | POA: Diagnosis not present

## 2021-07-13 DIAGNOSIS — R29898 Other symptoms and signs involving the musculoskeletal system: Secondary | ICD-10-CM | POA: Diagnosis not present

## 2021-07-13 DIAGNOSIS — I4891 Unspecified atrial fibrillation: Secondary | ICD-10-CM | POA: Diagnosis not present

## 2021-07-13 DIAGNOSIS — C7931 Secondary malignant neoplasm of brain: Secondary | ICD-10-CM | POA: Diagnosis not present

## 2021-07-13 DIAGNOSIS — E119 Type 2 diabetes mellitus without complications: Secondary | ICD-10-CM | POA: Diagnosis not present

## 2021-07-13 DIAGNOSIS — R482 Apraxia: Secondary | ICD-10-CM | POA: Diagnosis not present

## 2021-07-13 DIAGNOSIS — Z79899 Other long term (current) drug therapy: Secondary | ICD-10-CM | POA: Diagnosis not present

## 2021-07-13 DIAGNOSIS — Z950 Presence of cardiac pacemaker: Secondary | ICD-10-CM | POA: Diagnosis not present

## 2021-07-13 DIAGNOSIS — G4485 Primary stabbing headache: Secondary | ICD-10-CM | POA: Diagnosis not present

## 2021-07-13 DIAGNOSIS — K449 Diaphragmatic hernia without obstruction or gangrene: Secondary | ICD-10-CM | POA: Diagnosis not present

## 2021-07-13 DIAGNOSIS — C787 Secondary malignant neoplasm of liver and intrahepatic bile duct: Secondary | ICD-10-CM | POA: Diagnosis not present

## 2021-07-14 ENCOUNTER — Telehealth: Payer: Self-pay | Admitting: Internal Medicine

## 2021-07-14 NOTE — Telephone Encounter (Signed)
[name] calling to see if Dr. Caryl Comes is available this date and times for pt's MRI. Please advise  June 5,6,7 or 8th at 5pm

## 2021-07-19 ENCOUNTER — Other Ambulatory Visit: Payer: Self-pay | Admitting: Radiation Therapy

## 2021-07-19 ENCOUNTER — Encounter: Payer: Self-pay | Admitting: Radiation Therapy

## 2021-07-19 NOTE — Progress Notes (Signed)
I received a message back from Dr. Olin Pia nurse, Rosann Auerbach. He has requested that the brain MRI for Khali be done on Monday 6/5 and that he will arrive as close to 5pm as possible. Brylan is aware and plans to arrive between 4:30-4:45 to check in and do paperwork.    I have reached out to Richarda Overlie in MRI about the date and time, she will arrange for the [employer] rep to be there and to have MultiHance contrast available due to Li's known allergy.   In addition, I have reached out to Dr. Clarice Pole medical secretary, Caren Griffins, about sending in the pre-med for Yalexa to start taking 13 hours prior to her scan.   [place] RADIOLOGY INTRAVENOUS CONTRAST PRE-MEDICATION REGIMES FOR RADIOLOGY STUDIES Elective Procedures Adults ? 50 mg Prednisone PO 13, 7 and 1 hour before the injection. ? 50 mg Diphenhydramine (Benadryl) IV/PO within 1 hour of the injection.    Mont Dutton R.T.(R)(T) Radiation Special Procedures

## 2021-07-19 NOTE — Telephone Encounter (Signed)
Attempted phone call to [name] and left voicemail message Dr Caryl Comes would like to plan for MRI on Monday 07/31/2021 and will arrive as close to 5pm as possible.  May contact RN at [phone] for further questions.

## 2021-07-23 ENCOUNTER — Other Ambulatory Visit: Payer: Self-pay | Admitting: Adult Health

## 2021-07-26 DIAGNOSIS — H1013 Acute atopic conjunctivitis, bilateral: Secondary | ICD-10-CM | POA: Diagnosis not present

## 2021-07-26 NOTE — Telephone Encounter (Signed)
Pt is calling checking on the status of refill clonazepam. Pt [facility] on [facility]-2021. Pt has only 2 pills left

## 2021-07-26 NOTE — Telephone Encounter (Signed)
Last Vv 03/23/21 Filled 05/30/21 Is it ok to refill?

## 2021-07-31 ENCOUNTER — Ambulatory Visit (HOSPITAL_COMMUNITY)
Admission: RE | Admit: 2021-07-31 | Discharge: 2021-07-31 | Disposition: A | Discharge status: Home / Self Care | Payer: Medicare Other | Source: Ambulatory Visit | Attending: Neurological Surgery | Admitting: Neurological Surgery

## 2021-07-31 DIAGNOSIS — G9389 Other specified disorders of brain: Secondary | ICD-10-CM | POA: Diagnosis not present

## 2021-07-31 DIAGNOSIS — C7931 Secondary malignant neoplasm of brain: Secondary | ICD-10-CM | POA: Diagnosis not present

## 2021-07-31 DIAGNOSIS — C7949 Secondary malignant neoplasm of other parts of nervous system: Secondary | ICD-10-CM | POA: Insufficient documentation

## 2021-07-31 MED ORDER — GADOBENATE DIMEGLUMINE 529 MG/ML IV SOLN
14.0000 mL | Freq: Once | INTRAVENOUS | Status: AC | PRN
  Administered 2021-07-31: 14 mL via INTRAVENOUS

## 2021-08-03 ENCOUNTER — Telehealth: Payer: Self-pay | Admitting: Pharmacist

## 2021-08-03 NOTE — Chronic Care Management (AMB) (Cosign Needed)
Chronic Care Management Pharmacy Assistant   Name: [name]  MRN: [id number] DOB: 1955/10/03  Reason for Encounter: Disease State / Hypertension Assessment Call   Conditions to be addressed/monitored: HTN  Recent office visits:  05/11/2021 Dorothyann Peng NP - Patient was seen for Acute respiratory infection. Started Benzonatate 200 mg twice daily prn, Ceftin 500 mg twice daily and prednisone 10 mg dose pack. No follow up noted.   Recent consult visits:  03/00/9233 [name] MD (Surgery Oncology) - Patient was seen for Melanoma Cancer Reassessment. No medication changes. No follow up noted.   07/07/2021 [name] MD (neurosurgery) - Patient was seen for Secondary malignant neoplasm of brain and spinal cord and an additional issue. No other chart notes.   06/27/2021 Virl Axe MD (cardiology) - Patient was seen for atrial tachycardia and additional issues. Started Labetalol 50 mg twice daily. Discontinued calcium, metoprolol and prednisone. Follow up in 12 months.   06/22/2021 Melburn Hake [facility] - Patient was seen for History or immunotherapy. No other chart notes.   06/05/2021 [facility] [place] - Patient was seen for primary stabbing headache. No medication changes. No follow up noted.   Hospital visits:  None  Medications: Outpatient Encounter Medications as of 08/03/2021  Medication Sig   acetaminophen (TYLENOL) 500 MG tablet Take 500 mg by mouth every 6 (six) hours as needed for moderate pain.   albuterol (PROVENTIL) (2.5 MG/3ML) 0.083% nebulizer solution Take 3 mLs (2.5 mg total) by nebulization every 4 (four) hours as needed for wheezing or shortness of breath.   albuterol (VENTOLIN HFA) 108 (90 Base) MCG/ACT inhaler INHALE 2 PUFFS INTO THE LUNGS EVERY 4 (FOUR) HOURS AS NEEDED FOR WHEEZING OR SHORTNESS OF BREATH.   augmented betamethasone dipropionate (DIPROLENE) 0.05 % ointment Apply topically 2  (two) times daily. Limit to no longer than 2 weeks use. Not on face.   benzonatate (TESSALON) 200 MG capsule Take 1 capsule (200 mg total) by mouth 2 (two) times daily as needed for cough.   budesonide (ENTOCORT EC) 3 MG 24 hr capsule as needed for rash.   Calcium Alginate POWD Take 1 tablet by mouth daily.   calcium carbonate (TUMS - DOSED IN MG ELEMENTAL CALCIUM) 500 MG chewable tablet Chew 2 tablets by mouth daily as needed for indigestion or heartburn.   chlorhexidine (PERIDEX) 0.12 % solution as needed. thrash   cholestyramine (QUESTRAN) 4 g packet Take 4 g by mouth 2 (two) times daily.   clobetasol cream (TEMOVATE) 0.07 % Apply 1 application topically 2 (two) times daily as needed (rash).   clonazePAM (KLONOPIN) 1 MG tablet TAKE 1 TABLET BY MOUTH TWICE A DAY. MAY TAKE UP TO 3 TIMES DAILY IF NEEDED FOR EXTRA ANXIETY   cloNIDine (CATAPRES) 0.1 MG tablet Take 1 tablet (0.1 mg total) by mouth at bedtime.   cyanocobalamin (,VITAMIN B-12,) 1000 MCG/ML injection Inject 1 mL (1,000 mcg total) into the muscle every 30 (thirty) days.   Desoximetasone (TOPICORT) 0.25 % ointment Apply 1 application topically 2 (two) times daily. (Patient taking differently: Apply 1 application. topically 2 (two) times daily as needed (rash).)   diphenhydrAMINE (BENADRYL) 25 MG tablet Take 25 mg by mouth daily as needed for allergies.   EPINEPHrine 0.3 mg/0.3 mL IJ SOAJ injection Inject 0.3 mg into the muscle as needed for anaphylaxis.   fluorouracil (EFUDEX) 5 % cream Apply 1 application topically 2 (two) times daily as needed (rash).   fluticasone (  FLONASE) 50 MCG/ACT nasal spray Place 2 sprays into both nostrils daily.   fluticasone (FLOVENT HFA) 44 MCG/ACT inhaler Inhale 2 puffs into the lungs 2 (two) times daily as needed (shortness of breath).   hydrocortisone (ANUSOL-HC) 25 MG suppository Place rectally as needed.   hyoscyamine (LEVSIN SL) 0.125 MG SL tablet Place 0.125 mg under the tongue every 6 (six) hours as  needed for cramping.   labetalol (NORMODYNE) 100 MG tablet Take 0.5 tablets (50 mg total) by mouth 2 (two) times daily.   levETIRAcetam (KEPPRA) 250 MG tablet Take 250 mg by mouth See admin instructions. Take 250mg  three times a day at 6am, 12pm, 6pm.   levocetirizine (XYZAL) 5 MG tablet TAKE 1 TABLET BY MOUTH EVERY DAY AS NEEDED (Patient taking differently: Take 5 mg by mouth daily as needed for allergies.)   lidocaine (XYLOCAINE) 2 % solution 34mL swish and swallow as needed for sore throat; take with nystatin susp   lipase/protease/amylase (CREON) 12000 units CPEP capsule Take 12,000 Units by mouth daily with breakfast.   nystatin (MYCOSTATIN) 100000 UNIT/ML suspension Use as directed 5 mLs (500,000 Units total) in the mouth or throat 3 (three) times daily as needed (thrush).   Olopatadine HCl (PATADAY) 0.2 % SOLN Use one drop in each eye once daily as needed. (Patient taking differently: Place 1 drop into both eyes daily as needed (itchy eyes).)   ondansetron (ZOFRAN-ODT) 4 MG disintegrating tablet as needed for nausea/vomiting.   polyethylene glycol (MIRALAX / GLYCOLAX) packet Take 17 g by mouth at bedtime.   potassium chloride SA (K-DUR,KLOR-CON) 20 MEQ tablet Take 1 tablet (20 mEq total) by mouth 2 (two) times daily.   prochlorperazine (COMPAZINE) 10 MG tablet Take 1 tablet (10 mg total) by mouth every 6 (six) hours as needed for nausea or vomiting.   sodium chloride (OCEAN) 0.65 % SOLN nasal spray Place 1 spray into both nostrils as needed for congestion.    Spacer/Aero-Holding Chambers ([place]) MISC See admin instructions.   Syringe/Needle, Disp, (SYRINGE 3CC/27GX1-1/4") 27G X 1-1/4" 3 ML MISC Inject 1 mL into the muscle every 30 (thirty) days. Vit B12   Vitamin D, Ergocalciferol, (DRISDOL) 50000 units CAPS capsule TAKE 1 CAPSULE (50,000 UNITS TOTAL) BY MOUTH EVERY 7 (SEVEN) DAYS.   No facility-administered encounter medications on file as of 08/03/2021.  Fill History: ALBUTEROL  SULFATE  1.25 MG/3ML NEBU 06/05/2021 7   CHOLESTYRAMINE PACKET 12/19/2020 30   CLONAZEPAM 1 MG TABLET 07/26/2021 24   CLONIDINE HCL 0.1 MG TABLET 05/09/2021 90   CYANOCOBALAMIN 1,000 MCG/ML VL 07/14/2021 90   DESOXIMETASONE 0.25% OINTMENT 01/03/2021 30   FLUTICASONE PROPIONATE  50 MCG/ACT SUSP 03/12/2021 30   LABETALOL HCL 100 MG TABLET 06/27/2021 90   LEVETIRACETAM  250 MG TABS 07/11/2021 90   LIDOCAINE HYDROCHLORIDE VISCOUS  2 % SOLN 03/21/2021 20         Reviewed chart prior to disease state call. Spoke with patient regarding BP  Recent Office Vitals: BP Readings from Last 3 Encounters:  06/27/21 116/80  05/11/21 120/78  05/04/21 119/85   Pulse Readings from Last 3 Encounters:  06/27/21 64  05/11/21 64  05/04/21 60    Wt Readings from Last 3 Encounters:  06/27/21 145 lb (65.8 kg)  05/11/21 143 lb (64.9 kg)  05/04/21 143 lb (64.9 kg)     Kidney Function Lab Results  Component Value Date/Time   CREATININE 0.65 05/04/2021 01:07 PM   CREATININE 0.81 03/09/2021 12:52 PM   CREATININE  0.9 05/27/2015 09:24 AM   CREATININE 0.9 02/23/2015 09:05 AM   GFR 85.78 05/24/2011 11:08 AM   GFRNONAA >60 05/04/2021 01:07 PM   GFRAA >60 05/19/2019 11:47 AM       Latest Ref Rng & Units 05/04/2021    1:07 PM 03/09/2021   12:52 PM 12/13/2020    2:05 PM  BMP  Glucose 70 - 99 mg/dL 83  95  103   BUN 8 - 23 mg/dL 14  16  15    Creatinine 0.44 - 1.00 mg/dL 0.65  0.81  0.75   Sodium 135 - 145 mmol/L 138  137  140   Potassium 3.5 - 5.1 mmol/L 3.8  4.4  4.1   Chloride 98 - 111 mmol/L 104  104  107   CO2 22 - 32 mmol/L 28  24  23    Calcium 8.9 - 10.3 mg/dL 9.2  9.4  9.6     Current antihypertensive regimen:  Clonidine 0.1 mg at bedtime Labetalol 100 mg 1/2 tablet twice daily  How often are you checking your Blood Pressure? Patient states she is checking her blood pressures twice daily.  Current home BP readings: Patient was not at home with her current readings, she states  her blood pressure readings have been between 140/82 and 160/90.  What recent interventions/DTPs have been made by any provider to improve Blood Pressure control since last CPP Visit: Patients blood pressure medication was recently changed from Metoprolol to Labetalol.   Any recent hospitalizations or ED visits since last visit with CPP? No recent hospital visits.  What diet changes have been made to improve Blood Pressure Control?  Patient follows low sodium, carb and sugar Breakfast - patient will have 1/2 muffin, fruit and a protein drink Lunch - patient eats a lot of tuna salad, crackers. Dinner - patient will have either chicken or fish, rice or potato and vegetable. She will skip dinner if she is not [place]  What exercise is being done to improve your Blood Pressure Control?  Patient walks 5 days per week.   Adherence Review: Is the patient currently on ACE/ARB medication? No Does the patient have >5 day gap between last estimated fill dates? No  Care Gaps: AWV - scheduled 03/15/2022 Last BP - 116/80 on 06/27/2021 Last A1C - 5.0 on 05/04/2021 Shingrix - never done Papsmear - never done Mammogram - over due Tdap - postponed Hep C Screen - postponed HIV Screen - postponed  Star Rating Drugs: None  [place] Pharmacist Assistant [phone]

## 2021-08-04 ENCOUNTER — Other Ambulatory Visit (HOSPITAL_COMMUNITY): Payer: Self-pay | Admitting: Neurological Surgery

## 2021-08-04 ENCOUNTER — Other Ambulatory Visit: Payer: Self-pay | Admitting: Neurological Surgery

## 2021-08-04 ENCOUNTER — Telehealth: Payer: Self-pay | Admitting: Internal Medicine

## 2021-08-04 DIAGNOSIS — M899 Disorder of bone, unspecified: Secondary | ICD-10-CM | POA: Diagnosis not present

## 2021-08-04 NOTE — Telephone Encounter (Signed)
Pt states that she would like a call back regarding MRI results and f/u appt. Please advise

## 2021-08-04 NOTE — Telephone Encounter (Signed)
Spoke with pt who states she has been texting with Dr Caryl Comes regarding test results and follow up.  Pt states she will send MyCHart message once she has spoken with Dr Clarice Pole office.

## 2021-08-05 ENCOUNTER — Other Ambulatory Visit: Payer: Self-pay | Admitting: Internal Medicine

## 2021-08-05 DIAGNOSIS — I10 Essential (primary) hypertension: Secondary | ICD-10-CM

## 2021-08-09 ENCOUNTER — Ambulatory Visit (HOSPITAL_COMMUNITY)
Admission: RE | Admit: 2021-08-09 | Discharge: 2021-08-09 | Disposition: A | Discharge status: Home / Self Care | Payer: Medicare Other | Source: Ambulatory Visit | Attending: Neurological Surgery | Admitting: Neurological Surgery

## 2021-08-09 DIAGNOSIS — M899 Disorder of bone, unspecified: Secondary | ICD-10-CM | POA: Diagnosis not present

## 2021-08-09 DIAGNOSIS — M50223 Other cervical disc displacement at C6-C7 level: Secondary | ICD-10-CM | POA: Diagnosis not present

## 2021-08-09 DIAGNOSIS — M4312 Spondylolisthesis, cervical region: Secondary | ICD-10-CM | POA: Diagnosis not present

## 2021-08-09 DIAGNOSIS — M50222 Other cervical disc displacement at C5-C6 level: Secondary | ICD-10-CM | POA: Diagnosis not present

## 2021-08-09 DIAGNOSIS — M50221 Other cervical disc displacement at C4-C5 level: Secondary | ICD-10-CM | POA: Diagnosis not present

## 2021-08-09 MED ORDER — GADOBENATE DIMEGLUMINE 529 MG/ML IV SOLN
14.0000 mL | Freq: Once | INTRAVENOUS | Status: AC | PRN
  Administered 2021-08-09: 14 mL via INTRAVENOUS

## 2021-08-14 ENCOUNTER — Telehealth: Payer: Self-pay | Admitting: Internal Medicine

## 2021-08-14 ENCOUNTER — Other Ambulatory Visit: Payer: Self-pay

## 2021-08-14 ENCOUNTER — Inpatient Hospital Stay: Payer: Medicare Other | Attending: Radiation Oncology

## 2021-08-14 MED ORDER — CLONIDINE HCL 0.1 MG PO TABS: 0.1000 mg | ORAL_TABLET | Freq: Every day | ORAL | 3 refills | Status: DC

## 2021-08-14 NOTE — Telephone Encounter (Signed)
Left VM regarding refill request.

## 2021-08-14 NOTE — Telephone Encounter (Signed)
*  STAT* If patient is at the pharmacy, call can be transferred to refill team.   1. Which medications need to be refilled? (please list name of each medication and dose if known) cloNIDine (CATAPRES) 0.1 MG tablet  2. Which pharmacy/location (including street and city if local pharmacy) is medication to be sent to? [facility], [place] - [address]  3. Do they need a 30 day or 90 day supply? 90  Has 2 more days left of this RX.

## 2021-08-15 NOTE — Telephone Encounter (Signed)
Pt's medication was sent to pt's pharmacy as requested. Confirmation received.  °

## 2021-08-17 DIAGNOSIS — C787 Secondary malignant neoplasm of liver and intrahepatic bile duct: Secondary | ICD-10-CM | POA: Diagnosis not present

## 2021-08-17 DIAGNOSIS — Z9289 Personal history of other medical treatment: Secondary | ICD-10-CM | POA: Diagnosis not present

## 2021-08-17 DIAGNOSIS — K580 Irritable bowel syndrome with diarrhea: Secondary | ICD-10-CM | POA: Diagnosis not present

## 2021-08-17 DIAGNOSIS — M719 Bursopathy, unspecified: Secondary | ICD-10-CM | POA: Diagnosis not present

## 2021-08-17 DIAGNOSIS — R590 Localized enlarged lymph nodes: Secondary | ICD-10-CM | POA: Diagnosis not present

## 2021-08-31 ENCOUNTER — Ambulatory Visit (HOSPITAL_COMMUNITY): Payer: Medicare Other

## 2021-08-31 ENCOUNTER — Encounter (HOSPITAL_COMMUNITY): Payer: Self-pay

## 2021-09-12 ENCOUNTER — Telehealth: Payer: Self-pay | Admitting: Pharmacist

## 2021-09-12 NOTE — Chronic Care Management (AMB) (Signed)
    Chronic Care Management Pharmacy Assistant   Name: [name]  MRN: [id number] DOB: 1955-06-23  Reason for Encounter: Reschedule appointment   Appointment rescheduled with patient to 11/22/2021.   [place] Pharmacist Assistant [phone]

## 2021-09-19 DIAGNOSIS — R2241 Localized swelling, mass and lump, right lower limb: Secondary | ICD-10-CM | POA: Diagnosis not present

## 2021-09-19 DIAGNOSIS — M67853 Other specified disorders of tendon, right hip: Secondary | ICD-10-CM | POA: Diagnosis not present

## 2021-09-19 DIAGNOSIS — M7601 Gluteal tendinitis, right hip: Secondary | ICD-10-CM | POA: Diagnosis not present

## 2021-09-21 DIAGNOSIS — Z95810 Presence of automatic (implantable) cardiac defibrillator: Secondary | ICD-10-CM | POA: Diagnosis not present

## 2021-09-21 DIAGNOSIS — E119 Type 2 diabetes mellitus without complications: Secondary | ICD-10-CM | POA: Diagnosis not present

## 2021-09-21 DIAGNOSIS — I10 Essential (primary) hypertension: Secondary | ICD-10-CM | POA: Diagnosis not present

## 2021-09-21 DIAGNOSIS — M160 Bilateral primary osteoarthritis of hip: Secondary | ICD-10-CM | POA: Diagnosis not present

## 2021-09-21 DIAGNOSIS — Z791 Long term (current) use of non-steroidal anti-inflammatories (NSAID): Secondary | ICD-10-CM | POA: Diagnosis not present

## 2021-09-21 DIAGNOSIS — Z7951 Long term (current) use of inhaled steroids: Secondary | ICD-10-CM | POA: Diagnosis not present

## 2021-09-21 DIAGNOSIS — M7061 Trochanteric bursitis, right hip: Secondary | ICD-10-CM | POA: Diagnosis not present

## 2021-09-21 DIAGNOSIS — Z79899 Other long term (current) drug therapy: Secondary | ICD-10-CM | POA: Diagnosis not present

## 2021-09-21 DIAGNOSIS — M87051 Idiopathic aseptic necrosis of right femur: Secondary | ICD-10-CM | POA: Diagnosis not present

## 2021-09-21 DIAGNOSIS — I7300 Raynaud's syndrome without gangrene: Secondary | ICD-10-CM | POA: Diagnosis not present

## 2021-09-21 DIAGNOSIS — M7601 Gluteal tendinitis, right hip: Secondary | ICD-10-CM | POA: Diagnosis not present

## 2021-09-25 DIAGNOSIS — M7061 Trochanteric bursitis, right hip: Secondary | ICD-10-CM | POA: Diagnosis not present

## 2021-09-25 DIAGNOSIS — M7601 Gluteal tendinitis, right hip: Secondary | ICD-10-CM | POA: Diagnosis not present

## 2021-09-25 DIAGNOSIS — M87051 Idiopathic aseptic necrosis of right femur: Secondary | ICD-10-CM | POA: Diagnosis not present

## 2021-10-16 ENCOUNTER — Telehealth: Payer: Self-pay | Admitting: Internal Medicine

## 2021-10-16 NOTE — Telephone Encounter (Signed)
Pt called wanting to speak to Keitha [place] or [employer], pt did not say what it is about. Please advise.

## 2021-10-16 NOTE — Telephone Encounter (Signed)
Spoke with pt who is requesting a general medical letter be written  stating she requires functional heat and air due to her current medical conditions.  See letter for complete details.

## 2021-10-17 ENCOUNTER — Other Ambulatory Visit: Payer: Self-pay | Admitting: Internal Medicine

## 2021-10-17 ENCOUNTER — Other Ambulatory Visit: Payer: Self-pay | Admitting: Adult Health

## 2021-10-17 NOTE — Telephone Encounter (Signed)
Sent in electronically .  

## 2021-10-21 ENCOUNTER — Other Ambulatory Visit: Payer: Self-pay | Admitting: Internal Medicine

## 2021-10-24 ENCOUNTER — Telehealth: Payer: Medicare Other

## 2021-11-08 DIAGNOSIS — M7061 Trochanteric bursitis, right hip: Secondary | ICD-10-CM | POA: Diagnosis not present

## 2021-11-21 ENCOUNTER — Telehealth: Payer: Self-pay | Admitting: Pharmacist

## 2021-11-21 NOTE — Chronic Care Management (AMB) (Signed)
    Chronic Care Management Pharmacy Assistant   Name: PICCOLA ARICO  MRN: [id number] DOB: 29-Jun-1955  11/22/2021 APPOINTMENT REMINDER  Called [facility], No answer, left message of appointment on 11/22/2021 at 4:00 via telephone visit with Jeni Salles, Pharm D. Notified to have all medications, supplements, blood pressure and/or blood sugar logs available during appointment and to return call if need to reschedule.  Care Gaps: AWV - scheduled 03/15/2022 Last BP - 116/80 on 06/27/2021 Last A1C - 5.0 on 05/04/2021 Urine ACR - never done Shingrix - never done Pap smear - never done Mammogram - overdue Flu - due Tdap - postponed Hep C Screen - postponed HIV screen - postponed  Star Rating Drug: None  Any gaps in medications fill history? No  Gennie Alma [facility]  Catering manager [phone]

## 2021-11-22 ENCOUNTER — Ambulatory Visit (INDEPENDENT_AMBULATORY_CARE_PROVIDER_SITE_OTHER): Payer: Medicare Other | Admitting: Pharmacist

## 2021-11-22 DIAGNOSIS — I10 Essential (primary) hypertension: Secondary | ICD-10-CM

## 2021-11-22 DIAGNOSIS — F419 Anxiety disorder, unspecified: Secondary | ICD-10-CM

## 2021-11-22 NOTE — Progress Notes (Signed)
Chronic Care Management Pharmacy Note  11/24/2021 Name:  [name] MRN:  [id number] DOB:  03/26/1955  Summary: BP not at goal < 130/80 Pt is under a lot of stress right now   Recommendations/Changes made from today's visit: -Recommended repeat vitamin D level -Recommended follow up with cardiology for elevated BP -Recommended CBT and reached out to LCSW for assistance with getting connected  Plan: BP assessment in 3 months Follow up in 6 months  Subjective: [name] is an 66 y.o. year old female who is a primary patient of Amanda, Standley Brooking, MD.  The CCM team was consulted for assistance with disease management and care coordination needs.    Engaged with patient by telephone for follow up visit in response to provider referral for pharmacy case management and/or care coordination services.   Consent to Services:  The patient was given information about Chronic Care Management services, agreed to services, and gave verbal consent prior to initiation of services.  Please see initial visit note for detailed documentation.   Patient Care Team: Amanda, Standley Brooking, MD as PCP - Amanda Roca, MD as Consulting Physician (Neurosurgery) Amanda Pier, MD as Consulting Physician (Oncology) Amanda Mull, MD as Referring Physician (Internal Medicine) Amanda Marble, MD as Consulting Physician (Otolaryngology) Amanda Sensing, MD as Consulting Physician (Dermatology) [name], [facility] as Pharmacist (Pharmacist) [name], Darrick Grinder, MD as Consulting Physician (Allergy and Immunology)  Recent office visit 05/11/2021 Amanda Peng NP - Patient was seen for Acute respiratory infection. Started Benzonatate 200 mg twice daily prn, Ceftin 500 mg twice daily and prednisone 10 mg dose pack. No follow up noted.   03/23/21 Amanda Ace, MD: Patient presented for video visit for COVID infection.   Recent consult visits: 09/25/21 Amanda Duval, MD (ortho): Patient  presented for hip pain follow up.  Follow up in 2-3 months.  09/21/21 Amanda Duval, MD (ortho): Patient presented for hip pain follow up.  Follow up in 2-3 months.  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 Prescribed naproxen BID.   08/04/21 Amanda Miss, MD [facility] neurosurgery): Patient presented for lesion of cervical vertebra. Unable to access notes.  04/19/9796 Amanda Mull MD (Surgery Oncology) - Patient was seen for Melanoma Cancer Reassessment. No medication changes. No follow up noted.    07/07/2021 Amanda Miss MD (neurosurgery) - Patient was seen for Secondary malignant neoplasm of brain and spinal cord and an additional issue. No other chart notes.    06/27/2021 Virl Axe MD (cardiology) - Patient was seen for atrial tachycardia and additional issues. Started Labetalol 50 mg twice daily. Discontinued calcium, metoprolol and prednisone. Follow up in 12 months.    06/22/2021 [name] [facility] - Patient was seen for History or immunotherapy. No other chart notes.    06/05/2021 [facility] [place] - Patient was seen for primary stabbing headache. No medication changes. No follow up noted.   Hospital visits: 10/31/20 Patient presented to [facility] for [place] testing.  Objective:  Lab Results  Component Value Date   CREATININE 0.65 05/04/2021   BUN 14 05/04/2021   GFR 85.78 05/24/2011   GFRNONAA >60 05/04/2021   GFRAA >60 05/19/2019   NA 138 05/04/2021   K 3.8 05/04/2021   CALCIUM 9.2 05/04/2021   CO2 28 05/04/2021   GLUCOSE 83 05/04/2021    Lab Results  Component Value Date/Time  HGBA1C 5.0 05/04/2021 01:07 PM   HGBA1C 5.6 03/09/2021 12:52 PM   GFR 85.78 05/24/2011 11:08 AM   GFR 65.45 10/16/2010 02:11 PM    Last diabetic Eye exam: No results found for: "HMDIABEYEEXA"  Last diabetic Foot exam: No results  found for: "HMDIABFOOTEX"   Lab Results  Component Value Date   CHOL 220 (H) 07/07/2021   HDL 55 07/07/2021   LDLCALC 133 (H) 07/07/2021   LDLDIRECT 129 (H) 07/07/2021   TRIG 179 (H) 07/07/2021   CHOLHDL 4.0 07/07/2021       Latest Ref Rng & Units 08/09/2020   10:05 AM 05/27/2015    9:24 AM 02/23/2015    9:05 AM  Hepatic Function  Total Protein 6.0 - 8.3 g/dL 7.4  8.0  8.2   Albumin 3.5 - 5.2 g/dL 4.2  4.4  4.7   AST 0 - 37 U/L 27  25  35   ALT 0 - 35 U/L 27  33  42   Alk Phosphatase 39 - 117 U/L 120  127  157   Total Bilirubin 0.2 - 1.2 mg/dL 0.3  0.40  0.47   Bilirubin, Direct 0.0 - 0.3 mg/dL 0.0       Lab Results  Component Value Date/Time   TSH 1.146 12/17/2013 01:09 PM   TSH 0.318 (L) 08/28/2013 05:40 PM   TSH 0.82 05/01/2007 11:38 AM       Latest Ref Rng & Units 05/04/2021    1:07 PM 03/09/2021   12:52 PM 12/13/2020    2:05 PM  CBC  WBC 4.0 - 10.5 K/uL 7.3  6.6  6.5   Hemoglobin 12.0 - 15.0 g/dL 13.8  14.1  13.9   Hematocrit 36.0 - 46.0 % 41.8  43.1  42.1   Platelets 150 - 400 K/uL 285  291  280     Lab Results  Component Value Date/Time   VD25OH 24 (L) 10/24/2009 08:23 PM   VD25OH 24 (L) 11/17/2008 07:53 PM    Clinical ASCVD: Yes  The ASCVD Risk score (Arnett DK, et al., 2019) failed to calculate for the following reasons:   The patient has a prior MI or stroke diagnosis       03/14/2021    1:09 PM 03/22/2020   11:39 AM 03/15/2020    2:48 PM  Depression screen PHQ 2/9  Decreased Interest 0 0 0  Down, Depressed, Hopeless 0 0 0  PHQ - 2 Score 0 0 0      Social History   Tobacco Use  Smoking Status Never  Smokeless Tobacco Never   BP Readings from Last 3 Encounters:  06/27/21 116/80  05/11/21 120/78  05/04/21 119/85   Pulse Readings from Last 3 Encounters:  06/27/21 64  05/11/21 64  05/04/21 60   Wt Readings from Last 3 Encounters:  06/27/21 145 lb (65.8 kg)  05/11/21 143 lb (64.9 kg)  05/04/21 143 lb (64.9 kg)   BMI Readings from  Last 3 Encounters:  06/27/21 25.69 kg/m  05/11/21 25.33 kg/m  05/04/21 25.33 kg/m    Assessment/Interventions: Review of patient past medical history, allergies, medications, health status, including review of consultants reports, laboratory and other test data, was performed as part of comprehensive evaluation and provision of chronic care management services.   SDOH:  (Social Determinants of Health) assessments and interventions performed: No SDOH Interventions    Flowsheet Row Chronic Care Management from 11/22/2021 in [facility] [place] Management from 07/22/2019 in [place] [place] Interventions    Transportation Interventions -- Intervention Not Indicated  Financial Strain Interventions Intervention Not Indicated --      SDOH Screenings   Food Insecurity: No Food Insecurity (03/14/2021)  Housing: Low Risk  (03/14/2021)  Recent Concern: Housing - Medium Risk (12/26/2020)  Transportation Needs: No Transportation Needs (03/14/2021)  Depression (PHQ2-9): Low Risk  (03/14/2021)  Financial Resource Strain: Low Risk  (11/22/2021)  Physical Activity: Sufficiently Active (03/14/2021)  Social Connections: Socially Integrated (03/14/2021)  Stress: No Stress Concern Present (03/14/2021)  Recent Concern: Stress - Stress Concern Present (12/26/2020)  Tobacco Use: Low Risk  (06/27/2021)    CCM Care Plan  Allergies  Allergen Reactions   Bee Venom Swelling   Doxycycline Hives   Erythromycin Hives   Gadavist [Gadobutrol] Hives, Shortness Of Breath and Itching   Paxlovid [Nirmatrelvir-Ritonavir] Other (See Comments)    Mouth swelling, mouth ulcers   Penicillins Hives    Did it involve swelling of the face/tongue/throat, SOB, or low BP? No Did it involve sudden or severe rash/hives, skin peeling, or any reaction on the inside of your mouth or nose? Yes Did you need to seek medical attention at a hospital or doctor's office? Yes When did it last  happen?      5+ years If all above answers are "NO", may proceed with cephalosporin use.    Sulfa Antibiotics Hives   Preparation H [Pramox-Pe-Glycerin-Petrolatum] Hives    Cannot use  Cream or Suppositories    Phenergan [Promethazine Hcl] Other (See Comments)    Causes restless leg syndrome    Medications Reviewed Today     Reviewed by [name], [facility] (Pharmacist) on 11/22/21 at 1655  Med List Status: <None>   Medication Order Taking? Sig Documenting Provider Last Dose Status Informant  acetaminophen (TYLENOL) 500 MG tablet [id number]  Take 500 mg by mouth every 6 (six) hours as needed for moderate pain. [provider]  Active Self           Med Note [facility] MENDEZ, CARLOS A   Tue May 31, 2020  2:15 PM)    albuterol (PROVENTIL) (2.5 MG/3ML) 0.083% nebulizer solution [id number]  Take 3 mLs (2.5 mg total) by nebulization every 4 (four) hours as needed for wheezing or shortness of breath. Bobbitt, Sedalia Muta, MD  Active Self  albuterol (VENTOLIN HFA) 108 (90 Base) MCG/ACT inhaler [id number]  INHALE 2 PUFFS INTO THE LUNGS EVERY 4 (FOUR) HOURS AS NEEDED FOR WHEEZING OR SHORTNESS OF BREATH. Amanda, Standley Brooking, MD  Active Self  augmented betamethasone dipropionate (DIPROLENE) 0.05 % ointment [id number]  Apply topically 2 (two) times daily. Limit to no longer than 2 weeks use. Not on face. Amanda, Standley Brooking, MD  Active   benzonatate (TESSALON) 200 MG capsule [id number]  Take 1 capsule (200 mg total) by mouth 2 (two) times daily as needed for cough. Nafziger, Tommi Rumps, NP  Active   budesonide (ENTOCORT EC) 3 MG 24 hr capsule [id number]  as needed for rash. [provider]  Active   Calcium Alginate POWD [id number]  Take 1 tablet by mouth daily. [provider]  Active   calcium carbonate (TUMS - DOSED IN MG ELEMENTAL CALCIUM) 500 MG chewable tablet [id number]  Chew 2 tablets by mouth daily as needed for indigestion or heartburn. [provider]  Active Self  celecoxib  (CELEBREX) 200 MG capsule [id number] Yes Take 200 mg by mouth daily. [provider] Taking Active   chlorhexidine (PERIDEX) 0.12 %  solution [id number]  as needed. thrash [provider]  Active   cholestyramine (QUESTRAN) 4 g packet [id number]  Take 4 g by mouth 2 (two) times daily. [provider]  Active Self  clobetasol cream (TEMOVATE) 0.05 % [id number]  Apply 1 application topically 2 (two) times daily as needed (rash). [provider]  Active Self  clonazePAM (KLONOPIN) 1 MG tablet [id number]  TAKE 1 TABLET BY MOUTH TWICE A DAY MAY TAKE UP TO 3 TIMES DAILY IF NEEDED FOR EXTRA ANXIETY Amanda, Standley Brooking, MD  Active   cloNIDine (CATAPRES) 0.1 MG tablet [id number]  Take 1 tablet (0.1 mg total) by mouth at bedtime. Deboraha Sprang, MD  Active   cyanocobalamin (VITAMIN B12) 1000 MCG/ML injection [id number]  INJECT 1 ML (1,000 MCG TOTAL) INTO THE MUSCLE EVERY 30 DAYS. Amanda, Standley Brooking, MD  Active   Desoximetasone (TOPICORT) 0.25 % ointment [id number]  Apply 1 application topically 2 (two) times daily.  Patient taking differently: Apply 1 application. topically 2 (two) times daily as needed (rash).   Amanda, Standley Brooking, MD  Active   diphenhydrAMINE (BENADRYL) 25 MG tablet [id number]  Take 25 mg by mouth daily as needed for allergies. [provider]  Active Self  EPINEPHrine 0.3 mg/0.3 mL IJ SOAJ injection [id number]  Inject 0.3 mg into the muscle as needed for anaphylaxis. [provider]  Active Self  fluorouracil (EFUDEX) 5 % cream [id number]  Apply 1 application topically 2 (two) times daily as needed (rash). [provider]  Active Self  fluticasone (FLONASE) 50 MCG/ACT nasal spray [id number]  Place 2 sprays into both nostrils daily. Leath-Warren, Alda Lea, NP  Active   fluticasone (FLOVENT HFA) 44 MCG/ACT inhaler [id number]  Inhale 2 puffs into the lungs 2 (two) times daily as needed (shortness of breath). [provider]  Active Self   hydrocortisone (ANUSOL-HC) 25 MG suppository [id number]  Place rectally as needed. [provider]  Active   hyoscyamine (LEVSIN SL) 0.125 MG SL tablet [id number]  Place 0.125 mg under the tongue every 6 (six) hours as needed for cramping. [provider]  Active Self  labetalol (NORMODYNE) 100 MG tablet [id number] Yes Take 0.5 tablets (50 mg total) by mouth 2 (two) times daily. Deboraha Sprang, MD Taking Active   levETIRAcetam (KEPPRA) 250 MG tablet [id number]  Take 250 mg by mouth See admin instructions. Take 213m three times a day at 6am, 12pm, 6pm. [provider]  Active Self           Med Note (Constance Haw JACQUELINE L   Tue Jun 27, 2021  3:16 PM)    levocetirizine (XYZAL) 5 MG tablet [id number] TAKE 1 TABLET BY MOUTH EVERY DAY AS NEEDED  Patient taking differently: Take 5 mg by mouth daily as needed for allergies.   Bobbitt, RSedalia Muta MD  Active   lidocaine (XYLOCAINE) 2 % solution [id number] 579mswish and swallow as needed for sore throat; take with nystatin susp BuFenton Malling, PA-C  Active   lipase/protease/amylase (CREON) 12000 units CPEP capsule [id number] 12,000 Units by mouth daily with breakfast. [provider]  Active Self           Med Note ([facility], CARLOS A   Tue May 31, 2020  2:15 PM)    nystatin (MYCOSTATIN) 100000 UNIT/ML suspension [id number] as directed 5 mLs (500,000 Units total) in the mouth or throat 3 (three) times daily as needed (thrush). Amanda, WaStandley BrookingMD  Active Self  Olopatadine HCl (PATADAY) 0.2 % SOLN [id number]  Use one drop in each eye once daily as needed.  Patient taking differently: Place 1 drop into both eyes daily as needed (itchy eyes).   Bobbitt, Sedalia Muta, MD  Active Self  ondansetron (ZOFRAN-ODT) 4 MG disintegrating tablet [id number]  as needed for nausea/vomiting. [provider]  Active   polyethylene glycol (MIRALAX / GLYCOLAX) packet [id number]  Take 17 g by mouth at bedtime.  [provider]  Active Self  potassium chloride SA (K-DUR,KLOR-CON) 20 MEQ tablet [id number]  Take 1 tablet (20 mEq total) by mouth 2 (two) times daily. Owens Shark, NP  Active Self  prochlorperazine (COMPAZINE) 10 MG tablet [id number]  Take 1 tablet (10 mg total) by mouth every 6 (six) hours as needed for nausea or vomiting. Laurey Morale, MD  Active Self  sodium chloride (OCEAN) 0.65 % SOLN nasal spray [id number]  Place 1 spray into both nostrils as needed for congestion.  [provider]  Active Self  Spacer/Aero-Holding Josiah Lobo ([place]) Chrisman [id number]  See admin instructions. [provider]  Active Self  Syringe/Needle, Disp, (SYRINGE 3CC/27GX1-1/4") 27G X 1-1/4" 3 ML MISC [id number]  Inject 1 mL into the muscle every 30 (thirty) days. Vit B12 Amanda, Standley Brooking, MD  Active Self  Vitamin D, Ergocalciferol, (DRISDOL) 50000 units CAPS capsule [id number]  TAKE 1 CAPSULE (50,000 UNITS TOTAL) BY MOUTH EVERY 7 (SEVEN) DAYS. Deboraha Sprang, MD  Active Self            Patient Active Problem List   Diagnosis Date Noted   Moderate persistent asthma 12/08/2018   Perennial allergic rhinitis with predominantly nonallergic component 12/08/2018   Allergic conjunctivitis 12/08/2018   History of epistaxis 12/08/2018   Atopic dermatitis 12/08/2018   Recurrent urticaria 12/08/2018   Skeeter syndrome 12/08/2018   S/P laparoscopic cholecystectomy 03/28/2018   Dehydration 05/18/2016   History of pacemaker    POTS (postural orthostatic tachycardia syndrome)    Iron deficiency anemia 11/11/2014   Chronic colitis 08/28/2013   Malignant melanoma, metastatic ([place]) 02/09/2013   Malignant melanoma ([place]) 12/01/2012   Brain metastasis 10/16/2012   Hypertension 09/30/2012   Anxiety state, unspecified 09/30/2012   Hypoxemia 09/30/2012   Dysautonomia ([place]) 02/25/2011   Anticoagulant long-term use 01/19/2011   Neuritis 01/19/2011   Skin change 01/19/2011   CVA  (cerebral infarction)    Palpitation 06/28/2010   Pacemaker-Biotronik 06/28/2010   Dizziness and giddiness 04/27/2010   DIVERTICULOSIS, COLON, HX OF 01/19/2009   CAROTID SINUS SYNDROME 11/17/2008   OTHER MALAISE AND FATIGUE 11/17/2008   HYPERTENSION, BENIGN 11/02/2008   ATRIAL TACHYCARDIA 09/16/2008   SYNCOPE, HX OF 08/09/2008   INJURY UNSPEC NERVE SHOULDER GIRDLE&UPPER LIMB 07/12/2008   PERSONAL HISTORY OF MALIGNANT MELANOMA OF SKIN 07/12/2008   LUQ PAIN 12/08/2007   NECK PAIN 09/26/2007   NUMBNESS, ARM 09/26/2007   CPK, ABNORMAL 09/26/2007   ARM PAIN, LEFT 06/25/2007   SWELLING OF LIMB 06/25/2007   OSTEOPENIA 06/25/2007    Immunization History  Administered Date(s) Administered   Influenza Split 03/03/2012   PFIZER(Purple Top)SARS-COV-2 Vaccination 05/01/2019   PPD Test 07/01/2013   Pneumococcal Polysaccharide-23 10/03/2012    Patient reports she has been under a lot of stress lately. The tumor in her leg is stressful and she is also seeing ortho for AV in her right hip. She also has had no working A/C for the entire summer so they have to move out of their current  living situation. They moved into a rental property and it's great but the management isn't great.  She has had to get a lawyer for this. They are looking for something but haven't been able to find anything so far.   Patient reports she is still exhausted and having lots of stress. She lays down at night and she thinks of everything she needs to do. Patient thinks that counseling would probably be helpful. Patient is on medicare disability. She thinks a Education officer, museum would be helpful. She reports anything out of the ordinary sends her into a friendzy. She would rather have counseling than to take another medication right now.  Patient reports her blood pressure is not doing well. She is weaning off of the metoprolol and going to be going up on the labetatol. Patient is not seeing 180s or higher. She is checking every  day and usually seeing readings 130-140/100.   Conditions to be addressed/monitored:  Hypertension, GERD, Asthma, Anxiety, Osteopenia, Allergic Rhinitis and Seizures, Vitamin B12 deficiency, Vitamin D deficiency  Conditions addressed this visit: Hypertension, anxiety  Care Plan : CCM Pharmacy Care Plan  Updates made by [name], [place] since 11/24/2021 12:00 AM     Problem: Problem: Hypertension, GERD, Asthma, Anxiety, Osteopenia, Allergic Rhinitis and Seizures, Vitamin B12 deficiency, Vitamin D deficiency      Long-Range Goal: Patient-Specific Goal   Start Date: 07/01/2020  Expected End Date: 07/01/2021  Recent Progress: On track  Priority: High  Note:   Current Barriers:  Unable to independently monitor therapeutic efficacy  Pharmacist Clinical Goal(s):  Patient will achieve adherence to monitoring guidelines and medication adherence to achieve therapeutic efficacy achieve control of blood pressure as evidenced by home readings  through collaboration with PharmD and provider.   Interventions: 1:1 collaboration with Amanda, Standley Brooking, MD regarding development and update of comprehensive plan of care as evidenced by provider attestation and co-signature Inter-disciplinary care team collaboration (see longitudinal plan of care) Comprehensive medication review performed; medication list updated in electronic medical record  Hypertension (BP goal <130/80) -Controlled -Current treatment: Clonidine 0.68m, 1 tablet at bedtime - Appropriate, Query effective, Safe, Accessible Labetalol 1/2 tablet twice daily - Appropriate, Query effective, Safe, Accessible Metoprolol tartrate 25 mg 1 tablet twice daily - Appropriate, Query effective, Safe, Accessible -Medications previously tried: n/a  -Current home readings: 130-140/100 (checking every day) -Current dietary habits: did not discuss -Current exercise habits: exercising 2-3 miles 3-4 days a week; walked 2.5 miles yesterday -Denies  hypotensive/hypertensive symptoms -Educated on Importance of home blood pressure monitoring; Proper BP monitoring technique; -Counseled to monitor BP at home daily, document, and provide log at future appointments -Counseled on diet and exercise extensively Recommended to continue current medication Recommended follow up with cardiology about elevated BP.  Asthma (Goal: control symptoms) -Controlled -Current treatment  Albuterol 0.083% nebulizer solution, 3 MLs every four hours as needed for wheezing or shortness of breath - Appropriate, Effective, Safe, Accessible Albuterol HFA 1069m/act inhaler, 2 puffs every hours as needed for wheezing or shortness of breath - Appropriate, Effective, Safe, Accessible Advair 230/21 inhale once daily - Appropriate, Effective, Safe, Accessible -Medications previously tried: n/a  -Exacerbations requiring treatment in last 6 months: none -Patient reports consistent use of maintenance inhaler -Frequency of rescue inhaler use: none in last 6 months -Counseled on Proper inhaler technique; Benefits of consistent maintenance inhaler use When to use rescue inhaler Differences between maintenance and rescue inhalers -Recommended to continue current medication  Depression/Anxiety (Goal: minimize symptoms) -Controlled -Current  treatment: Clonazepam 47m, 1 tablet twice daily - Appropriate, Effective, Safe, Accessible -Medications previously tried/failed: n/a -PHQ9: n/a -GAD7: n/a -Educated on Benefits of medication for symptom control -Recommended to continue current medication  Osteopenia (Goal prevent fractures) -Controlled -Last DEXA Scan: 07/31/19  T-Score femoral neck: -2.0  T-Score total hip: n/a  T-Score lumbar spine: -2.0  T-Score forearm radius: n/a  10-year probability of major osteoporotic fracture: unknown  10-year probability of hip fracture: unknown -Patient is not a candidate for pharmacologic treatment -Current treatment  Vitamin D3  50,000 units, 1 capsule once a week - Appropriate, Query effective, Safe, Accessible Calcium 500 mg, 1 tablet once daily - Appropriate, Effective, Safe, Accessible -Medications previously tried: n/a  -Recommend [phone] units of vitamin D daily. Recommend 1200 mg of calcium daily from dietary and supplemental sources. Recommend weight-bearing and muscle strengthening exercises for building and maintaining bone density. -Recommended to continue current medication  GERD/Heartburn (Goal: minimize symptoms) -Controlled -Current treatment  Calcium carbonate (Tums) 5056m chew 1 tablet daily as needed for indigestion or heartburn - Appropriate, Effective, Safe, Accessible -Medications previously tried: none  -Recommended to continue current medication  Colitis (Goal: minimize symptoms) -Controlled -Current treatment  Lipase/ protease/ amylase (Creon) 12,000 units, 1 capsule once daily with breakfast  (taking as needed) - Appropriate, Effective, Safe, Accessible -Medications previously tried: none  -Recommended to continue current medication Assessed patient finances. Patient can afford Creon right now.  Atopic dermatitis (Goal: minimize symptoms) -Not ideally controlled -Current treatment  Betamethasone dipropionate 0.05% ointment, apply two times daily. Limit to no longer than 2 weeks use. Not on face. Desoximetasone 0.6.28%intment, 1 application twice daily Diphenhydramine (Benadryl) 2516m1 tablet at bedtime as needed Epipen as needed -Medications previously tried: n/a  -Recommended to continue current medication  Allergic rhinitis (Goal: minimize symptoms) -Controlled -Current treatment  Fluticasone (Flonase) 89m59mct nasal spray, 2 sprays into both nostrils daily as needed - Appropriate, Effective, Safe, Accessible levocetirizine (Xyzal) 5mg,73mtablet everday as needed - Appropriate, Effective, Safe, Accessible Sodium chloride 0.65% nasal spray solution, 1 spray into both nostrils  as needed for congesion (uses 3 to 4 x/ day) - Appropriate, Effective, Safe, Accessible Olopatadine (Pataday) 0.2% solution, 1 drop in each eye once daily as needed - Appropriate, Effective, Safe, Accessible -Medications previously tried: montelukast (lethargy), claritin (ineffective)   -Recommended to continue current medication Counseled on duplication of therapy with Benadryl, Claritin and Xyzal and patient stopped using Claritin due to lack of efficacy.  Seizures (Goal: prevent seizures) -Controlled -Current treatment  levetiracetam 289mg,38mablet three times daily - Appropriate, Effective, Safe, Accessible -Medications previously tried: none  -Recommended to continue current medication  Thrush (Goal: minimize symptoms) -Controlled -Current treatment  nystatin 100000 unit/ ml, 5 MLs three times daily as needed thrush - Appropriate, Effective, Safe, Accessible -Medications previously tried: none  -Recommended to continue current medication  Pain (Goal: minimize pain) -Controlled -Current treatment  Ibuprofen 200mg, 44m400mg th40mtimes daily as needed for moderate pain - Appropriate, Query effective, Safe, Accessible Acetaminophen 500mg, 1 30met at bedtime - Appropriate, Effective, Safe, Accessible -Medications previously tried: n/a  -  Patient had fractured her toe and they saw arthritis on her x-ray; suggested trying a topical product such as Voltaren gel but she prefers not to and has had success with yunkers ointment from medical supply   Vitamin B12 deficiency (Goal: 211-911) -Controlled -Current treatment  Cyanocobalamin 1000 mcg SL 1 tablet daily - Appropriate, Effective, Safe, Accessible -Medications previously tried: none  -Recommended  to continue current medication  Health Maintenance -Vaccine gaps: shingles, COVID, tetanus -Current therapy:  Hydrocortisone 25m suppository, place 1 rectally at bedtime as needed for hemorrhoids Potassium chloride 278m, 1 tablet  twice daily -Educated on Cost vs benefit of each product must be carefully weighed by individual consumer -Patient is satisfied with current therapy and denies issues -Recommended to continue current medication  Patient Goals/Self-Care Activities Patient will:  - take medications as prescribed check blood pressure daily, document, and provide at future appointments target a minimum of 150 minutes of moderate intensity exercise weekly  Follow Up Plan: Telephone follow up appointment with care management team member scheduled for: 6 months       Medication Assistance: None required.  Patient affirms current coverage meets needs.  Compliance/Adherence/Medication fill history: Care Gaps: Tetanus, shingrix, HIV screen, Hep C screening PAP smear, mammogram, urine ACR, influenza Last BP - 116/80 on 06/27/2021 Last A1C - 5.0 on 05/04/2021  Star-Rating Drugs: None  [facility]:  [id number] [address] [id number]: [phone]: [phone] [place] [address]: [phone]: [phone] Uses pill box? Yes Pt endorses 100% compliance  We discussed: Current pharmacy is preferred with insurance plan and patient is satisfied with pharmacy services Patient decided to: Continue current medication management strategy  Care Plan and Follow Up Patient Decision:  Patient agrees to Care Plan and Follow-up.  Plan: Telephone follow up appointment with care management team member scheduled for:  6 months  MaJeni SallesPharmD BCWest Hurleyharmacist Le[place]valet [phone]

## 2021-11-24 NOTE — Patient Instructions (Signed)
Hi [name],  It was great to catch up again! I hope things become less stressful in the near future. Don't forget to be on the lookout for a call from [place], our Education officer, museum.  Please reach out to me if you have any questions or need anything before our follow up!  Best, [name]  Jeni Salles, PharmD, [place] Pharmacist [place] [facility] [place]

## 2021-11-25 DIAGNOSIS — J45909 Unspecified asthma, uncomplicated: Secondary | ICD-10-CM | POA: Diagnosis not present

## 2021-11-25 DIAGNOSIS — M810 Age-related osteoporosis without current pathological fracture: Secondary | ICD-10-CM | POA: Diagnosis not present

## 2021-11-25 DIAGNOSIS — I10 Essential (primary) hypertension: Secondary | ICD-10-CM | POA: Diagnosis not present

## 2021-12-13 ENCOUNTER — Telehealth: Payer: Self-pay | Admitting: Licensed Clinical Social Worker

## 2021-12-14 NOTE — Patient Outreach (Signed)
  Care Coordination   Initial Visit Note   12/14/2021 Name: [name] MRN: [id number] DOB: [birth year]  [name] is a 66 y.o. year old female who sees Panosh, Standley Brooking, MD for primary care. I spoke with  [name] by phone today.  What matters to the patients health and wellness today?  Stress Management    Goals Addressed             This Visit's Progress    Stress Management   On track    Care Coordination Interventions: Active listening / Reflection utilized  Emotional Support Provided Verbalization of feelings encouraged  LCSW introduced self and explained role in care coordination Pt reports difficulty with management of stress due to health conditions and current move Initial appt scheduled         SDOH assessments and interventions completed:  No     Care Coordination Interventions Activated:  Yes  Care Coordination Interventions:  Yes, provided   Follow up plan: Follow up call scheduled for 12/27/21    Encounter Outcome:  Pt. Scheduled   [name], MSW, [email or web address] Phone [phone] 5:07 PM

## 2021-12-14 NOTE — Patient Instructions (Signed)
Visit Information  Thank you for taking time to visit with me today. Please don't hesitate to contact me if I can be of assistance to you.   Following are the goals we discussed today:   Goals Addressed             This Visit's Progress    Stress Management   On track    Care Coordination Interventions: Active listening / Reflection utilized  Emotional Support Provided Verbalization of feelings encouraged  LCSW introduced self and explained role in care coordination Pt reports difficulty with management of stress due to health conditions and current move Initial appt scheduled         Our next appointment is by telephone on 12/27/21 at 10:30 AM  Please call the care guide team at [phone] if you need to cancel or reschedule your appointment.   If you are experiencing a Mental Health or [place] or need someone to talk to, please call the Suicide and Crisis Lifeline: 988 call 911   Patient verbalizes understanding of instructions and care plan provided today and agrees to view in [place]. Active MyChart status and patient understanding of how to access instructions and care plan via MyChart confirmed with patient.     [name], MSW, [email or web address] Phone [phone] 5:09 PM

## 2021-12-20 ENCOUNTER — Other Ambulatory Visit (HOSPITAL_COMMUNITY): Payer: Self-pay | Admitting: Physician Assistant

## 2021-12-20 ENCOUNTER — Other Ambulatory Visit: Payer: Self-pay | Admitting: Physician Assistant

## 2021-12-20 DIAGNOSIS — M87051 Idiopathic aseptic necrosis of right femur: Secondary | ICD-10-CM

## 2021-12-25 ENCOUNTER — Telehealth: Payer: Self-pay | Admitting: Internal Medicine

## 2021-12-25 ENCOUNTER — Other Ambulatory Visit: Payer: Self-pay

## 2021-12-25 MED ORDER — POTASSIUM CHLORIDE CRYS ER 20 MEQ PO TBCR: 20.0000 meq | EXTENDED_RELEASE_TABLET | Freq: Two times a day (BID) | ORAL | 2 refills | Status: DC

## 2021-12-25 NOTE — Telephone Encounter (Signed)
*  STAT* If patient is at the pharmacy, call can be transferred to refill team.   1. Which medications need to be refilled? (please list name of each medication and dose if known) potassium chloride SA (K-DUR,KLOR-CON) 20 MEQ tablet  2. Which pharmacy/location (including street and city if local pharmacy) is medication to be sent to? [facility] [id number] - [place], [place] - [address]  3. Do they need a 30 day or 90 day supply? 90 day supply   Patient is completely out of medication

## 2021-12-25 NOTE — Telephone Encounter (Signed)
Pt's medication was sent to pt's pharmacy as requested. Confirmation received.  °

## 2021-12-27 ENCOUNTER — Ambulatory Visit: Payer: Self-pay | Admitting: Licensed Clinical Social Worker

## 2021-12-27 ENCOUNTER — Other Ambulatory Visit: Payer: Self-pay | Admitting: Internal Medicine

## 2021-12-27 NOTE — Telephone Encounter (Signed)
  Pt called to request a refill of the following: clonazePAM (KLONOPIN) 1 MG tablet  LOV: 05/11/21 = Acute visit  Pt states she only has enough for about 3 days.  Please advise.  [facility] [id number] - [address] Phone:  [phone]  Fax:  [phone]

## 2021-12-28 NOTE — Telephone Encounter (Signed)
Pt called to say pharmacy is telling her they have not received the Rx for this refill. Pt is now becoming very worried because she is running out. Pt states she is already feeling very uneasy.   Pt needs Rx sent to:  [facility] [id number] - [address] Phone:  [phone]  Fax:  [phone]    Asap!!

## 2021-12-28 NOTE — Patient Instructions (Signed)
Visit Information  Thank you for taking time to visit with me today. Please don't hesitate to contact me if I can be of assistance to you.   Following are the goals we discussed today:   Goals Addressed             This Visit's Progress    Stress Management   On track    Care Coordination Interventions: Active listening / Reflection utilized  Emotional Support Provided Verbalization of feelings encouraged  Patient reports difficulty managing ongoing stressors while managing chronic health conditions. Pt receives strong support from spouse and friends Validation and Encouragement provided Patient and spouse has secured a lawyer to assist with rental suit. Recently re-located to new residence Patient was successful in identifying healthy coping skills to assist with management of anxiety and stress symptoms Discussed correlation between one's physical and mental health, in addition, to how stress negatively impacts both LCSW discussed activities that promote mindfulness and decrease symptoms Patient shared that she is focusing on stability regarding residence and treatment. She may be interested in establishing therapy in 1-2 months. Participates in support groups via zoom ([place] and [place]) LCSW agreed to send her counseling resources to review via e-mail         Our next appointment is by telephone on 11/29 at 11 AM  Please call the care guide team at [phone] if you need to cancel or reschedule your appointment.   If you are experiencing a Mental Health or [place] or need someone to talk to, please call the Suicide and Crisis Lifeline: 988 call 911   Patient verbalizes understanding of instructions and care plan provided today and agrees to view in [place]. Active MyChart status and patient understanding of how to access instructions and care plan via MyChart confirmed with patient.     [name], MSW, [email or web address] Phone [phone] 10:43 PM

## 2021-12-28 NOTE — Patient Outreach (Signed)
  Care Coordination   Initial Visit Note   12/28/2021 Name: RASHAUNDA RAHL MRN: [id number] DOB: 1955-05-08  Liana Gerold is a 66 y.o. year old female who sees Panosh, Standley Brooking, MD for primary care. I spoke with  Liana Gerold by phone today.  What matters to the patients health and wellness today?  Stress Management    Goals Addressed             This Visit's Progress    Stress Management   On track    Care Coordination Interventions: Active listening / Reflection utilized  Emotional Support Provided Verbalization of feelings encouraged  Patient reports difficulty managing ongoing stressors while managing chronic health conditions. Pt receives strong support from spouse and friends Validation and Encouragement provided Patient and spouse has secured a lawyer to assist with rental suit. Recently re-located to new residence Patient was successful in identifying healthy coping skills to assist with management of anxiety and stress symptoms Discussed correlation between one's physical and mental health, in addition, to how stress negatively impacts both LCSW discussed activities that promote mindfulness and decrease symptoms Patient shared that she is focusing on stability regarding residence and treatment. She may be interested in establishing therapy in 1-2 months. Participates in support groups via zoom [facility] and [place]) LCSW agreed to send her counseling resources to review via e-mail         SDOH assessments and interventions completed:  Yes  SDOH Interventions Today    Flowsheet Row Most Recent Value  SDOH Interventions   Housing Interventions Intervention Not Indicated  Transportation Interventions Intervention Not Indicated        Care Coordination Interventions Activated:  Yes  Care Coordination Interventions:  Yes, provided   Follow up plan: Follow up call scheduled for 4-6 weeks    Encounter Outcome:  Pt. Visit Completed   [name],  MSW, [email or web address] Phone [phone] 10:43 PM

## 2021-12-28 NOTE — Telephone Encounter (Signed)
Pt called to FU on  refill request.

## 2021-12-29 NOTE — Telephone Encounter (Signed)
Pt called again, stating she is completely out and very worried, not having this medication.  Pt is afraid not taking it will affect the other medications she is on.  Pt is asking if it is possible that Dr. Sarajane Jews would be willing to refill this prescription for her, as Pt states she has seen him before ?  Please advise.

## 2021-12-29 NOTE — Telephone Encounter (Signed)
Thanks for filling medication for her

## 2022-01-09 ENCOUNTER — Telehealth: Payer: Self-pay | Admitting: Internal Medicine

## 2022-01-09 NOTE — Telephone Encounter (Signed)
Spoke with Crystal who states pt needs to be scheduled for MRI.  Will need a time that Dr Caryl Comes can attend to monitor device.  Advised Dr Caryl Comes is out of the office this week.  Will provide a date once received from Dr Caryl Comes.

## 2022-01-11 DIAGNOSIS — E119 Type 2 diabetes mellitus without complications: Secondary | ICD-10-CM | POA: Diagnosis not present

## 2022-01-11 DIAGNOSIS — C7800 Secondary malignant neoplasm of unspecified lung: Secondary | ICD-10-CM | POA: Diagnosis not present

## 2022-01-11 DIAGNOSIS — I7300 Raynaud's syndrome without gangrene: Secondary | ICD-10-CM | POA: Diagnosis not present

## 2022-01-11 DIAGNOSIS — Z79899 Other long term (current) drug therapy: Secondary | ICD-10-CM | POA: Diagnosis not present

## 2022-01-11 DIAGNOSIS — K769 Liver disease, unspecified: Secondary | ICD-10-CM | POA: Diagnosis not present

## 2022-01-11 DIAGNOSIS — K580 Irritable bowel syndrome with diarrhea: Secondary | ICD-10-CM | POA: Diagnosis not present

## 2022-01-11 DIAGNOSIS — Z85820 Personal history of malignant melanoma of skin: Secondary | ICD-10-CM | POA: Diagnosis not present

## 2022-01-11 DIAGNOSIS — Z950 Presence of cardiac pacemaker: Secondary | ICD-10-CM | POA: Diagnosis not present

## 2022-01-11 DIAGNOSIS — Z8505 Personal history of malignant neoplasm of liver: Secondary | ICD-10-CM | POA: Diagnosis not present

## 2022-01-11 DIAGNOSIS — I10 Essential (primary) hypertension: Secondary | ICD-10-CM | POA: Diagnosis not present

## 2022-01-11 DIAGNOSIS — M87059 Idiopathic aseptic necrosis of unspecified femur: Secondary | ICD-10-CM | POA: Diagnosis not present

## 2022-01-11 DIAGNOSIS — C439 Malignant melanoma of skin, unspecified: Secondary | ICD-10-CM | POA: Diagnosis not present

## 2022-01-11 DIAGNOSIS — Z800 Family history of malignant neoplasm of digestive organs: Secondary | ICD-10-CM | POA: Diagnosis not present

## 2022-01-11 DIAGNOSIS — C787 Secondary malignant neoplasm of liver and intrahepatic bile duct: Secondary | ICD-10-CM | POA: Diagnosis not present

## 2022-01-17 ENCOUNTER — Encounter: Payer: Self-pay | Admitting: Internal Medicine

## 2022-01-17 ENCOUNTER — Ambulatory Visit (INDEPENDENT_AMBULATORY_CARE_PROVIDER_SITE_OTHER): Payer: Medicare Other | Admitting: Internal Medicine

## 2022-01-17 VITALS — BP 114/94 | HR 76 | Temp 97.4°F | Ht 63.0 in | Wt 152.6 lb

## 2022-01-17 DIAGNOSIS — M8700 Idiopathic aseptic necrosis of unspecified bone: Secondary | ICD-10-CM | POA: Diagnosis not present

## 2022-01-17 DIAGNOSIS — I7300 Raynaud's syndrome without gangrene: Secondary | ICD-10-CM | POA: Diagnosis not present

## 2022-01-17 DIAGNOSIS — Z85820 Personal history of malignant melanoma of skin: Secondary | ICD-10-CM

## 2022-01-17 DIAGNOSIS — Z0000 Encounter for general adult medical examination without abnormal findings: Secondary | ICD-10-CM | POA: Diagnosis not present

## 2022-01-17 DIAGNOSIS — N949 Unspecified condition associated with female genital organs and menstrual cycle: Secondary | ICD-10-CM | POA: Diagnosis not present

## 2022-01-17 DIAGNOSIS — C439 Malignant melanoma of skin, unspecified: Secondary | ICD-10-CM | POA: Diagnosis not present

## 2022-01-17 LAB — POC URINALSYSI DIPSTICK (AUTOMATED)
Bilirubin, UA: NEGATIVE
Blood, UA: NEGATIVE
Glucose, UA: NEGATIVE
Ketones, UA: NEGATIVE
Nitrite, UA: NEGATIVE
Protein, UA: NEGATIVE
Spec Grav, UA: 1.015 (ref 1.010–1.025)
Urobilinogen, UA: 0.2 E.U./dL
pH, UA: 6 (ref 5.0–8.0)

## 2022-01-17 MED ORDER — CEPHALEXIN 500 MG PO CAPS: 500.0000 mg | ORAL_CAPSULE | Freq: Three times a day (TID) | ORAL | 0 refills | Status: DC

## 2022-01-17 NOTE — Progress Notes (Signed)
Chief Complaint  Patient presents with   Annual Exam    Pt brought ct scan reports from [facility] to discuss with Dr. Regis Bill.    Vaginal Itching    Pt c/o vaginal itching and burning. Sx started a wk ago. With a slight back pain.    HPI: Patient  [name]  66 y.o. comes in today for Preventive Health Care visit w  spouse   A coulple of issues  Vaginal  urine sx vx uti no fever  no dc not sure if significant  Raynauds  cold given   labetolol by cards  Dr Caryl Comes  instead of metoprolol. By cards  not sure any better  Had full eval at [facility]  no metastasis but lright hip  cw AVN  .  Degenerative dis of s pine  no acute fracture or mets.  Celebrex doesn not help ms pain so diesnt take it  sliding HH decided to not proceed with surgery cause of risk benefit    uses special pillow elevation  at ight  diet adjustment for now  Cards to see  in December . [place] use of clonazepam  as needed  Health Maintenance  Topic Date Due   Diabetic kidney evaluation - Urine ACR  Never done   Zoster Vaccines- Shingrix (1 of 2) Never done   PAP SMEAR-Modifier  Never done   MAMMOGRAM  11/26/2010   Medicare Annual Wellness (AWV)  03/14/2022   Hepatitis C Screening  03/14/2022 (Originally 12[birth year]30/1975)   HIV Screening  03/14/2022 (Originally 12/29/1972)   INFLUENZA VACCINE  05/27/2022 (Originally 09/26/2021)   Diabetic kidney evaluation - GFR measurement  05/05/2022   COLONOSCOPY (Pts 45-50yrs Insurance coverage will need to be confirmed)  12/07/2026   HPV VACCINES  Aged Out   COVID-19 Vaccine  Discontinued   Health Maintenance Review LIFESTYLE:  Exercise:   Tobacco/ETS:n Alcohol: n ocass Sugar beverages:n Sleep:ok Drug use: no HH of 2 no pet    ROS:   REST of 12 system review negative except as per HPI   Past Medical History:  Diagnosis Date   Anemia    Angio-edema    Anxiety    Arthritis    Asthma    Atrial fibrillation (HCC)    Atrial tachycardia    Autonomic dysfunction    Cervicalgia     Complication of anesthesia    pt states wakes up with "shakes"   CVA (cerebral infarction)    2012 with dizziness and vision change felt embolic from atrial tachy   Disorder of bone and cartilage, unspecified    Disturbance of skin sensation    Diverticulosis    DM (diabetes mellitus) ([place])    DVT (deep venous thrombosis) (HCC)    DVT (deep venous thrombosis) (HCC)    Eczema    Family history of anesthesia complication    PONV and " shaking "   Fatty liver    GERD (gastroesophageal reflux disease)    History of hiatal hernia    History of pacemaker    History of radiation therapy 10/24/2012   brain   HLD (hyperlipidemia)    HTN (hypertension)    Hypoglycemia, unspecified    Injury to unspecified nerve of shoulder girdle and upper limb    Liver metastasis    Lung metastasis    Metastasis to brain (HCC)    Osteopenia    Other nonspecific abnormal serum enzyme levels    Other specified congenital anomalies of nervous system  Pacemaker    autonomic dysfunction   Pancreatitis    from therapy   Peripheral vascular disease (HCC)    PONV (postoperative nausea and vomiting)    POTS (postural orthostatic tachycardia syndrome)    Recurrent upper respiratory infection (URI)    Skin cancer    Hx: of lung lesion   Stroke ([place])    left weaker    Swelling of limb    Urticaria     Past Surgical History:  Procedure Laterality Date   ABLATION SAPHENOUS VEIN W/ RFA     2002 x3   ADENOIDECTOMY     CARPAL TUNNEL RELEASE Left    CHOLECYSTECTOMY  03/28/2018   CHOLECYSTECTOMY N/A 03/28/2018   Procedure: LAPAROSCOPIC CHOLECYSTECTOMY WITH INTRAOPERATIVE CHOLANGIOGRAM;  Surgeon: Johnathan Hausen, MD;  Location: [facility];  Service: General;  Laterality: N/A;   CRANIOTOMY N/A 09/30/2012   suboccipital craniectomy   CRANIOTOMY Left 02/09/2013   Procedure: LEFT PARIETAL CRANIOTOMY with stealth;  Surgeon: [name], MD;  Location: [place] [facility];  Service: Neurosurgery;  Laterality: Left;   LEFT Parietal Craniotomy for tumor with stealth   DEEP AXILLARY SENTINEL NODE BIOPSY / EXCISION     due to extensive Melanoma-right arm   fatty tumor removed     from chest   INSERT / REPLACE / REMOVE PACEMAKER  2012   1999, x 3   LAPAROSCOPY  09/06/2011   Procedure: LAPAROSCOPY OPERATIVE;  Surgeon: Claiborne Billings A. Pamala Hurry, MD;  Location: [facility];  Service: Gynecology;  Laterality: Left;  with Left Ovarian Cystectomy    MELANOMA EXCISION     with removal of lymph nodes, left shoulder   NOSE SURGERY  2010, 2012   for nose bleeds x 2   PACEMAKER IMPLANT     SINOSCOPY     TONSILLECTOMY AND ADENOIDECTOMY      Family History  Problem Relation Age of Onset   Allergic rhinitis Sister    Stroke Mother    Colon polyps Mother    Colon cancer Mother    Urticaria Mother    Allergic rhinitis Mother    Heart disease Father    Diabetes Father    Kidney disease Father    Diabetes Maternal Grandmother    Clotting disorder Maternal Grandmother        stroke   Crohn's disease Maternal Grandmother    Diabetes Paternal Grandmother    Aneurysm Sister        brain    Social History   Socioeconomic History   Marital status: Married    Spouse name: Not on file   Number of children: 0   Years of education: Not on file   Highest education level: Not on file  Occupational History   Occupation: PRESIDENT    Employer: [employer]    Comment: self employed  Tobacco Use   Smoking status: Never   Smokeless tobacco: Never  Vaping Use   Vaping Use: Never used  Substance and Sexual Activity   Alcohol use: Never    Comment: glass of wine daily   Drug use: No   Sexual activity: Not on file  Other Topics Concern   Not on file  Social History Narrative   4 years college hh of 2    Neg tad   Social Determinants of Health   Financial Resource Strain: Low Risk  (11/22/2021)   Overall Financial Resource Strain (CARDIA)    Difficulty of Paying Living Expenses: Not very hard  Food  Insecurity: No Food Insecurity (03/14/2021)   Hunger Vital Sign    Worried About Running Out of Food in the Last Year: Never true    Ran Out of Food in the Last Year: Never true  Transportation Needs: No Transportation Needs (12/27/2021)   PRAPARE - Hydrologist (Medical): No    Lack of Transportation (Non-Medical): No  Physical Activity: Sufficiently Active (03/14/2021)   Exercise Vital Sign    Days of Exercise per Week: 4 days    Minutes of Exercise per Session: 90 min  Stress: No Stress Concern Present (03/14/2021)   [place]    Feeling of Stress : Not at all  Recent Concern: Stress - Stress Concern Present (12/26/2020)   [place]    Feeling of Stress : To some extent  Social Connections: Socially Integrated (03/14/2021)   Social Connection and Isolation Panel [NHANES]    Frequency of Communication with Friends and Family: More than three times a week    Frequency of Social Gatherings with Friends and Family: More than three times a week    Attends Religious Services: More than 4 times per year    Active Member of [employer] or Organizations: Yes    Attends Music therapist: More than 4 times per year    Marital Status: Married    Outpatient Medications Prior to Visit  Medication Sig Dispense Refill   acetaminophen (TYLENOL) 500 MG tablet Take 500 mg by mouth every 6 (six) hours as needed for moderate pain.     albuterol (PROVENTIL) (2.5 MG/3ML) 0.083% nebulizer solution Take 3 mLs (2.5 mg total) by nebulization every 4 (four) hours as needed for wheezing or shortness of breath. 180 mL 1   albuterol (VENTOLIN HFA) 108 (90 Base) MCG/ACT inhaler INHALE 2 PUFFS INTO THE LUNGS EVERY 4 (FOUR) HOURS AS NEEDED FOR WHEEZING OR SHORTNESS OF BREATH. 18 Inhaler 0   augmented betamethasone dipropionate (DIPROLENE) 0.05 %  ointment Apply topically 2 (two) times daily. Limit to no longer than 2 weeks use. Not on face. 50 g 0   benzonatate (TESSALON) 200 MG capsule Take 1 capsule (200 mg total) by mouth 2 (two) times daily as needed for cough. 20 capsule 0   budesonide (ENTOCORT EC) 3 MG 24 hr capsule as needed for rash.     Calcium Alginate POWD Take 1 tablet by mouth daily.     calcium carbonate (TUMS - DOSED IN MG ELEMENTAL CALCIUM) 500 MG chewable tablet Chew 2 tablets by mouth daily as needed for indigestion or heartburn.     chlorhexidine (PERIDEX) 0.12 % solution as needed. thrash     cholestyramine (QUESTRAN) 4 g packet Take 4 g by mouth 2 (two) times daily.     clobetasol cream (TEMOVATE) 9.48 % Apply 1 application topically 2 (two) times daily as needed (rash).     clonazePAM (KLONOPIN) 1 MG tablet TAKE 1 TABLET BY MOUTH TWICE A DAY MAY TAKE UP TO 3 TIMES DAILY IF NEEDED FOR EXTRA ANXIETY 60 tablet 0   cloNIDine (CATAPRES) 0.1 MG tablet Take 1 tablet (0.1 mg total) by mouth at bedtime. 90 tablet 3   cyanocobalamin (VITAMIN B12) 1000 MCG/ML injection INJECT 1 ML (1,000 MCG TOTAL) INTO THE MUSCLE EVERY 30 DAYS. 3 mL 4   Desoximetasone (TOPICORT) 0.25 % ointment Apply 1 application topically 2 (two) times daily. (Patient taking differently: Apply 1  application  topically 2 (two) times daily as needed (rash).) 30 g 1   diphenhydrAMINE (BENADRYL) 25 MG tablet Take 25 mg by mouth daily as needed for allergies.     EPINEPHrine 0.3 mg/0.3 mL IJ SOAJ injection Inject 0.3 mg into the muscle as needed for anaphylaxis.     fluorouracil (EFUDEX) 5 % cream Apply 1 application topically 2 (two) times daily as needed (rash).     fluticasone (FLONASE) 50 MCG/ACT nasal spray Place 2 sprays into both nostrils daily. 16 g 0   fluticasone (FLOVENT HFA) 44 MCG/ACT inhaler Inhale 2 puffs into the lungs 2 (two) times daily as needed (shortness of breath).     hydrocortisone (ANUSOL-HC) 25 MG suppository Place rectally as needed.      hyoscyamine (LEVSIN SL) 0.125 MG SL tablet Place 0.125 mg under the tongue every 6 (six) hours as needed for cramping.     labetalol (NORMODYNE) 100 MG tablet Take 0.5 tablets (50 mg total) by mouth 2 (two) times daily. 90 tablet 3   levETIRAcetam (KEPPRA) 250 MG tablet Take 250 mg by mouth See admin instructions. Take 250mg  three times a day at 6am, 12pm, 6pm.     levocetirizine (XYZAL) 5 MG tablet TAKE 1 TABLET BY MOUTH EVERY DAY AS NEEDED (Patient taking differently: Take 5 mg by mouth daily as needed for allergies.) 30 tablet 0   lidocaine (XYLOCAINE) 2 % solution 82mL swish and swallow as needed for sore throat; take with nystatin susp 200 mL 0   lipase/protease/amylase (CREON) 12000 units CPEP capsule Take 12,000 Units by mouth daily with breakfast.     metoprolol tartrate (LOPRESSOR) 25 MG tablet 1 tablet with food Orally Twice a day for 30 day(s)     nystatin (MYCOSTATIN) 100000 UNIT/ML suspension Use as directed 5 mLs (500,000 Units total) in the mouth or throat 3 (three) times daily as needed (thrush). 60 mL 2   Olopatadine HCl (PATADAY) 0.2 % SOLN Use one drop in each eye once daily as needed. (Patient taking differently: Place 1 drop into both eyes daily as needed (itchy eyes).) 2.5 mL 5   ondansetron (ZOFRAN-ODT) 4 MG disintegrating tablet as needed for nausea/vomiting.     polyethylene glycol (MIRALAX / GLYCOLAX) packet Take 17 g by mouth at bedtime.     potassium chloride SA (KLOR-CON M) 20 MEQ tablet Take 1 tablet (20 mEq total) by mouth 2 (two) times daily. 180 tablet 2   prochlorperazine (COMPAZINE) 10 MG tablet Take 1 tablet (10 mg total) by mouth every 6 (six) hours as needed for nausea or vomiting. 60 tablet 0   sodium chloride (OCEAN) 0.65 % SOLN nasal spray Place 1 spray into both nostrils as needed for congestion.      Spacer/Aero-Holding Chambers ([place]) MISC See admin instructions.     Syringe/Needle, Disp, (SYRINGE 3CC/27GX1-1/4") 27G X 1-1/4" 3 ML MISC Inject 1  mL into the muscle every 30 (thirty) days. Vit B12 12 each 0   Vitamin D, Ergocalciferol, (DRISDOL) 50000 units CAPS capsule TAKE 1 CAPSULE (50,000 UNITS TOTAL) BY MOUTH EVERY 7 (SEVEN) DAYS. 4 capsule 11   celecoxib (CELEBREX) 200 MG capsule Take 200 mg by mouth daily. (Patient not taking: Reported on 01/20/2022)     No facility-administered medications prior to visit.     EXAM:  BP (!) 114/94 (BP Location: Left Arm, Patient Position: Sitting, Cuff Size: Normal)   Pulse 76   Temp (!) 97.4 F (36.3 C) (Oral)   Ht 5'  3" (1.6 m)   Wt 152 lb 9.6 oz (69.2 kg)   SpO2 98%   BMI 27.03 kg/m   Body mass index is 27.03 kg/m. Wt Readings from Last 3 Encounters:  01/17/22 152 lb 9.6 oz (69.2 kg)  06/27/21 145 lb (65.8 kg)  05/11/21 143 lb (64.9 kg)    Physical Exam: Vital signs reviewed DSK:AJGO is a well-developed well-nourished alert cooperative    who appearsr stated age in no acute distress.  HEENT: normocephalic atraumatic , Eyes: PERRL EOM's full, conjunctiva clear, Nares: paten,t no deformity discharge or tenderness., Ears: no deformity EAC's clear TMs with normal landmarks. Mouth: clear OP, no lesions, edema.  Moist mucous membranes. Dentition in adequate repair. NECK: supple without masses, thyromegaly or bruits. CHEST/PULM:  Clear to auscultation and percussion breath sounds equal no wheeze , rales or rhonchi. No chest wall deformities or tenderness. CV: PMI is nondisplaced, S1 S2 no gallops, murmurs, rubs. Peripheral pulses are full without delay.No JVD .  ABDOMEN: Bowel sounds normal nontender  No guard or rebound, no hepato splenomegal no CVA tenderness.   Extremtities:  No clubbing cyanosis or edema, no acute joint swelling or redness no focal atrophy  pink extrenitites but nl temp pulses intact  NEURO:  Oriented x3, cranial nerves 3-12 appear to be intact, no obvious focal weakness,gait within normal limits no abnormal reflexes or asymmetrical SKIN: No acute rashes normal  turgor, color, no bruising or petechiae. PSYCH: Oriented, good eye contact, no obvious depression anxiety, cognition and judgment appear normal. LN: no cervical axillary adenopathy  Lab Results  Component Value Date   WBC 7.3 05/04/2021   HGB 13.8 05/04/2021   HCT 41.8 05/04/2021   PLT 285 05/04/2021   GLUCOSE 83 05/04/2021   CHOL 220 (H) 07/07/2021   TRIG 179 (H) 07/07/2021   HDL 55 07/07/2021   LDLDIRECT 129 (H) 07/07/2021   LDLCALC 133 (H) 07/07/2021   ALT 27 08/09/2020   AST 27 08/09/2020   NA 138 05/04/2021   K 3.8 05/04/2021   CL 104 05/04/2021   CREATININE 0.65 05/04/2021   BUN 14 05/04/2021   CO2 28 05/04/2021   TSH 1.146 12/17/2013   INR 1.03 08/28/2013   HGBA1C 5.0 05/04/2021    BP Readings from Last 3 Encounters:  01/17/22 (!) 114/94  06/27/21 116/80  05/11/21 120/78    X ray and ct  [facility]  reviewed with patient  UA 3+ leuk   ASSESSMENT AND PLAN:  Discussed the following assessment and plan:    ICD-10-CM   1. Visit for preventive health examination  Z00.00     2. Vaginal burning  N94.9 POCT Urinalysis Dipstick (Automated)    Culture, Urine    Culture, Urine    3. AVN (avascular necrosis of bone) (HCC)  M87.00    incidental finding on staging ct  right hip    4. Raynaud's phenomenon without gangrene  I73.00     5. PERSONAL HISTORY OF MALIGNANT MELANOMA OF SKIN  Z85.820     6. Malignant melanoma, metastatic ([place])  C43.9    in remission    Pyuria  r/0 uti rx given in case    Disc second and fu opinion on AVN findings on ct   Given  kelfex rx in case uti sx progress and or  ur cx positive .   Return for yearly or  as indicated .  Patient Care Team: Avannah Decker, Standley Brooking, MD as PCP - Lisa Roca, MD as Consulting  Physician (Neurosurgery) [name], MD as Consulting Physician (Oncology) [name], MD as Referring Physician (Internal Medicine) Jodi Marble, MD as Consulting Physician (Otolaryngology) Danella Sensing, MD as  Consulting Physician (Dermatology) Viona Gilmore, [facility] as Pharmacist (Pharmacist) Harold Hedge, Darrick Grinder, MD as Consulting Physician (Allergy and Immunology) Patient Instructions  Good to see  you  today .Marland Kitchen  Agree getting the avn  opinion. Urine culture pending   If needed can begin keflex for uti   You had  cmp and cbc in [facility] at [facility]    normal except borderline sugar elevation.  Last cholesterol level was 5 23 . Will opine to cards about  bp meds . To help avoid raynuads aggravation.   HOV elevation and life style does seem the safest for Hiatal hernia at this time.    Standley Brooking. Christan Defranco M.D.

## 2022-01-17 NOTE — Patient Instructions (Addendum)
Good to see  you  today .Marland Kitchen  Agree getting the avn  opinion. Urine culture pending   If needed can begin keflex for uti   You had  cmp and cbc in [facility] at [facility]    normal except borderline sugar elevation.  Last cholesterol level was 5 23 . Will opine to cards about  bp meds . To help avoid raynuads aggravation.   HOV elevation and life style does seem the safest for Hiatal hernia at this time.

## 2022-01-18 LAB — URINE CULTURE
MICRO NUMBER:: 14224836
SPECIMEN QUALITY:: ADEQUATE

## 2022-01-20 NOTE — Progress Notes (Signed)
Culture shows no predominant bacteria  thus  no UTI

## 2022-01-23 NOTE — Telephone Encounter (Signed)
Spoke with Crystal after discussing date for MRI with Dr Caryl Comes.  Requested appointment be scheduled for 02/08/2022 at 6pm.

## 2022-01-24 ENCOUNTER — Encounter: Payer: Self-pay | Admitting: Licensed Clinical Social Worker

## 2022-01-24 ENCOUNTER — Telehealth: Payer: Self-pay | Admitting: Licensed Clinical Social Worker

## 2022-01-24 DIAGNOSIS — M25551 Pain in right hip: Secondary | ICD-10-CM | POA: Diagnosis not present

## 2022-01-24 NOTE — Patient Outreach (Signed)
  Care Coordination   01/24/2022 Name: [name] MRN: [id number] DOB: 1955-09-27   Care Coordination Outreach Attempts:  An unsuccessful telephone outreach was attempted for a scheduled appointment today.  Follow Up Plan:  Additional outreach attempts will be made to offer the patient care coordination information and services.   Encounter Outcome:  No Answer   Care Coordination Interventions:  No, not indicated    [name], MSW, [email or web address] Phone [phone] 11:04 AM

## 2022-01-26 ENCOUNTER — Telehealth: Payer: Self-pay | Admitting: Licensed Clinical Social Worker

## 2022-01-29 NOTE — Patient Instructions (Signed)
Visit Information  Thank you for taking time to visit with me today. Please don't hesitate to contact me if I can be of assistance to you.   Following are the goals we discussed today:   Goals Addressed             This Visit's Progress    Stress Management   On track    Care Coordination Interventions: Active listening / Reflection utilized  Emotional Support Provided Verbalization of feelings encouraged  Patient reports ongoing stressors that she would like to process with LCSW. Appt was rescheduled Validation and Encouragement provided          Our next appointment is by telephone on 01/30/22 at 1:30 PM  Please call the care guide team at [phone] if you need to cancel or reschedule your appointment.   If you are experiencing a Mental Health or [place] or need someone to talk to, please call the Suicide and Crisis Lifeline: 988 call 911   Patient verbalizes understanding of instructions and care plan provided today and agrees to view in [place]. Active MyChart status and patient understanding of how to access instructions and care plan via MyChart confirmed with patient.     [name], MSW, [email or web address] Phone [phone] 11:02 PM

## 2022-01-29 NOTE — Patient Outreach (Signed)
  Care Coordination   Follow Up Visit Note   01/29/2022 Name: [name] MRN: [id number] DOB: November 22, 1955  [name] is a 66 y.o. year old female who sees Panosh, Standley Brooking, MD for primary care. I spoke with  [name] by phone today.  What matters to the patients health and wellness today?  Rescheduling LCSW appt    Goals Addressed             This Visit's Progress    Stress Management   On track    Care Coordination Interventions: Active listening / Reflection utilized  Emotional Support Provided Verbalization of feelings encouraged  Patient reports ongoing stressors that she would like to process with LCSW. Appt was rescheduled Validation and Encouragement provided          SDOH assessments and interventions completed:  No     Care Coordination Interventions:  Yes, provided   Follow up plan: Follow up call scheduled for 1 week    Encounter Outcome:  Pt. Visit Completed   [name], MSW, [email or web address] Phone [phone] 11:01 PM

## 2022-01-30 ENCOUNTER — Encounter: Payer: Self-pay | Admitting: Licensed Clinical Social Worker

## 2022-02-01 ENCOUNTER — Telehealth: Payer: Self-pay | Admitting: Licensed Clinical Social Worker

## 2022-02-01 NOTE — Patient Outreach (Signed)
  Care Coordination **Late Entry**  02/01/2022 Name: JAZMEEN AXTELL MRN: [id number] DOB: Sep 06, 1955   Care Coordination Outreach Attempts-Made on 01/30/22:  An unsuccessful telephone outreach was attempted today to offer the patient information about available care coordination services as a benefit of their health plan. LCSW is submitting late documentation on 02/01/22  Follow Up Plan:  Additional outreach attempts will be made to offer the patient care coordination information and services.   Encounter Outcome:  No Answer   Care Coordination Interventions:  No, not indicated    [name], MSW, [email or web address] Phone [phone] 5:09 PM

## 2022-02-07 ENCOUNTER — Ambulatory Visit: Payer: Self-pay | Admitting: Licensed Clinical Social Worker

## 2022-02-08 ENCOUNTER — Telehealth: Payer: Self-pay | Admitting: Pharmacist

## 2022-02-08 ENCOUNTER — Ambulatory Visit (HOSPITAL_COMMUNITY): Admission: RE | Admit: 2022-02-08 | Payer: Medicare Other | Source: Ambulatory Visit

## 2022-02-08 ENCOUNTER — Encounter (HOSPITAL_COMMUNITY): Payer: Self-pay

## 2022-02-08 NOTE — Patient Instructions (Signed)
Visit Information  Thank you for taking time to visit with me today. Please don't hesitate to contact me if I can be of assistance to you.   Following are the goals we discussed today:   Goals Addressed             This Visit's Progress    Stress Management   On track    Care Coordination Interventions: Solution-Focused Strategies employed:  Mindfulness or Relaxation training provided Active listening / Reflection utilized  Emotional Support Provided Verbalization of feelings encouraged  Patient reports increase in anxiety and stress symptoms triggered by ongoing family strain. Patient has tried to obtain legal support, to no avail. LCSW will provide a resource to assist with strengthening support system Patient is concerned about being harmed by a [facility] employee or having chart manipulated due to incident that occurred earlier in the year. LCSW informed pt of the organizations ICare values and systems put in place to protect HIPPA and compliance. Patient would like chart to be password protected. LCSW will collaborate with front desk staff to iniate Patient has imaging appt scheduled and will discuss possibility of palliative care          Our next appointment is by telephone on 02/21/22 at 11 AM  Please call the care guide team at [phone] if you need to cancel or reschedule your appointment.   If you are experiencing a Mental Health or [place] or need someone to talk to, please call the Suicide and Crisis Lifeline: 988 call 911   Patient verbalizes understanding of instructions and care plan provided today and agrees to view in [place]. Active MyChart status and patient understanding of how to access instructions and care plan via MyChart confirmed with patient.     [name], MSW, [email or web address] Phone [phone] 11:19 AM

## 2022-02-08 NOTE — Patient Outreach (Signed)
  Care Coordination Late Entry/Documentation  Follow Up Visit Note   Outreach completed 02/07/22 Name: [name] MRN: [id number] DOB: [birth year]  [name] is a 66 y.o. year old female who sees Panosh, Standley Brooking, MD for primary care. I spoke with  [name] by phone today.  What matters to the patients health and wellness today?  Stress/Anxiety Management    Goals Addressed             This Visit's Progress    Stress Management   On track    Care Coordination Interventions: Solution-Focused Strategies employed:  Mindfulness or Relaxation training provided Active listening / Reflection utilized  Emotional Support Provided Verbalization of feelings encouraged  Patient reports increase in anxiety and stress symptoms triggered by ongoing family strain. Patient has tried to obtain legal support, to no avail. LCSW will provide a resource to assist with strengthening support system Patient is concerned about being harmed by a [facility] employee or having chart manipulated due to incident that occurred earlier in the year. LCSW informed pt of the organizations ICare values and systems put in place to protect HIPPA and compliance. Patient would like chart to be password protected. LCSW will collaborate with front desk staff to iniate Patient has imaging appt scheduled and will discuss possibility of palliative care          SDOH assessments and interventions completed:  No     Care Coordination Interventions:  Yes, provided   Follow up plan: Follow up call scheduled for 2-4 weeks    Encounter Outcome:  Pt. Visit Completed   [name], MSW, [email or web address] Phone [phone] 11:17 AM

## 2022-02-08 NOTE — Chronic Care Management (AMB) (Signed)
Chronic Care Management Pharmacy Assistant   Name: [name]  MRN: [id number] DOB: 01-10-1956  Reason for Encounter: Disease State / Hypertension Assessment Call   Conditions to be addressed/monitored: HTN  Recent office visits:  01/17/2022 Amanda Ace MD - Patient was seen for preventive health examination and additional concerns. Started Cephalexin. Follow up in 1 year.   Recent consult visits:  01/24/2022 Amanda Cancel MD(orthopedic) - Patient was seen for pain in right hip joint. No additional chart notes.   [id number]/5681 Amanda Mull MD (oncology) - Patient was seen for Melanoma metastatic to liver. No medication changes. No follow up noted.   Hospital visits:  None  Medications: Outpatient Encounter Medications as of 02/08/2022  Medication Sig Note   acetaminophen (TYLENOL) 500 MG tablet Take 500 mg by mouth every 6 (six) hours as needed for moderate pain.    albuterol (PROVENTIL) (2.5 MG/3ML) 0.083% nebulizer solution Take 3 mLs (2.5 mg total) by nebulization every 4 (four) hours as needed for wheezing or shortness of breath.    albuterol (VENTOLIN HFA) 108 (90 Base) MCG/ACT inhaler INHALE 2 PUFFS INTO THE LUNGS EVERY 4 (FOUR) HOURS AS NEEDED FOR WHEEZING OR SHORTNESS OF BREATH.    augmented betamethasone dipropionate (DIPROLENE) 0.05 % ointment Apply topically 2 (two) times daily. Limit to no longer than 2 weeks use. Not on face.    benzonatate (TESSALON) 200 MG capsule Take 1 capsule (200 mg total) by mouth 2 (two) times daily as needed for cough.    budesonide (ENTOCORT EC) 3 MG 24 hr capsule as needed for rash.    Calcium Alginate POWD Take 1 tablet by mouth daily.    calcium carbonate (TUMS - DOSED IN MG ELEMENTAL CALCIUM) 500 MG chewable tablet Chew 2 tablets by mouth daily as needed for indigestion or heartburn.    celecoxib (CELEBREX) 200 MG capsule Take 200 mg by mouth daily. (Patient not taking: Reported on 01/20/2022) 01/20/2022: Want helpful    cephALEXin (KEFLEX) 500 MG capsule Take 1 capsule (500 mg total) by mouth 3 (three) times daily. If needed for UTI    chlorhexidine (PERIDEX) 0.12 % solution as needed. thrash    cholestyramine (QUESTRAN) 4 g packet Take 4 g by mouth 2 (two) times daily.    clobetasol cream (TEMOVATE) 2.75 % Apply 1 application topically 2 (two) times daily as needed (rash).    clonazePAM (KLONOPIN) 1 MG tablet TAKE 1 TABLET BY MOUTH TWICE A DAY MAY TAKE UP TO 3 TIMES DAILY IF NEEDED FOR EXTRA ANXIETY    cloNIDine (CATAPRES) 0.1 MG tablet Take 1 tablet (0.1 mg total) by mouth at bedtime.    cyanocobalamin (VITAMIN B12) 1000 MCG/ML injection INJECT 1 ML (1,000 MCG TOTAL) INTO THE MUSCLE EVERY 30 DAYS.    Desoximetasone (TOPICORT) 0.25 % ointment Apply 1 application topically 2 (two) times daily. (Patient taking differently: Apply 1 application  topically 2 (two) times daily as needed (rash).)    diphenhydrAMINE (BENADRYL) 25 MG tablet Take 25 mg by mouth daily as needed for allergies.    EPINEPHrine 0.3 mg/0.3 mL IJ SOAJ injection Inject 0.3 mg into the muscle as needed for anaphylaxis.    fluorouracil (EFUDEX) 5 % cream Apply 1 application topically 2 (two) times daily as needed (rash).    fluticasone (FLONASE) 50 MCG/ACT nasal spray Place 2 sprays into both nostrils daily.    fluticasone (FLOVENT HFA) 44 MCG/ACT inhaler Inhale 2 puffs into the lungs 2 (two) times daily as needed (shortness  of breath).    hydrocortisone (ANUSOL-HC) 25 MG suppository Place rectally as needed.    hyoscyamine (LEVSIN SL) 0.125 MG SL tablet Place 0.125 mg under the tongue every 6 (six) hours as needed for cramping.    labetalol (NORMODYNE) 100 MG tablet Take 0.5 tablets (50 mg total) by mouth 2 (two) times daily.    levETIRAcetam (KEPPRA) 250 MG tablet Take 250 mg by mouth See admin instructions. Take 250mg  three times a day at 6am, 12pm, 6pm.    levocetirizine (XYZAL) 5 MG tablet TAKE 1 TABLET BY MOUTH EVERY DAY AS NEEDED (Patient  taking differently: Take 5 mg by mouth daily as needed for allergies.)    lidocaine (XYLOCAINE) 2 % solution 39mL swish and swallow as needed for sore throat; take with nystatin susp    lipase/protease/amylase (CREON) 12000 units CPEP capsule Take 12,000 Units by mouth daily with breakfast.    metoprolol tartrate (LOPRESSOR) 25 MG tablet 1 tablet with food Orally Twice a day for 30 day(s)    nystatin (MYCOSTATIN) 100000 UNIT/ML suspension Use as directed 5 mLs (500,000 Units total) in the mouth or throat 3 (three) times daily as needed (thrush).    Olopatadine HCl (PATADAY) 0.2 % SOLN Use one drop in each eye once daily as needed. (Patient taking differently: Place 1 drop into both eyes daily as needed (itchy eyes).)    ondansetron (ZOFRAN-ODT) 4 MG disintegrating tablet as needed for nausea/vomiting.    polyethylene glycol (MIRALAX / GLYCOLAX) packet Take 17 g by mouth at bedtime.    potassium chloride SA (KLOR-CON M) 20 MEQ tablet Take 1 tablet (20 mEq total) by mouth 2 (two) times daily.    prochlorperazine (COMPAZINE) 10 MG tablet Take 1 tablet (10 mg total) by mouth every 6 (six) hours as needed for nausea or vomiting.    sodium chloride (OCEAN) 0.65 % SOLN nasal spray Place 1 spray into both nostrils as needed for congestion.     Spacer/Aero-Holding Chambers ([name]) MISC See admin instructions.    Syringe/Needle, Disp, (SYRINGE 3CC/27GX1-1/4") 27G X 1-1/4" 3 ML MISC Inject 1 mL into the muscle every 30 (thirty) days. Vit B12    Vitamin D, Ergocalciferol, (DRISDOL) 50000 units CAPS capsule TAKE 1 CAPSULE (50,000 UNITS TOTAL) BY MOUTH EVERY 7 (SEVEN) DAYS.    No facility-administered encounter medications on file as of 02/08/2022.  Fill History:   Dispensed Days Supply Quantity Provider Pharmacy  ALBUTEROL SULFATE  1.25 MG/3ML NEBU 06/05/2021 7 150 mL      Dispensed Days Supply Quantity Provider Pharmacy  BENZONATATE 200 MG CAPSULE 05/11/2021 10 20 each      Dispensed Days  Supply Quantity Provider Pharmacy  CELECOXIB  200 MG CAPS 01/15/2022 30 30 capsule      Dispensed Days Supply Quantity Provider Pharmacy  CHOLESTYRAMINE PACKET 12/19/2020 30 60 each      Dispensed Days Supply Quantity Provider Pharmacy  CLOBETASOL 0.05% CREAM 02/16/2020 30 30 g      Dispensed Days Supply Quantity Provider Pharmacy  CLONAZEPAM 1 MG TABLET 12/29/2021 30 60 each      Dispensed Days Supply Quantity Provider Pharmacy  CLONIDINE HCL 0.1 MG TABLET 01/26/2022 90 90 each      Dispensed Days Supply Quantity Provider Pharmacy  CYANOCOBALAMIN 1,000 MCG/ML VL 01/21/2022 84 3 mL      Dispensed Days Supply Quantity Provider Pharmacy  DESOXIMETASONE 0.25% OINTMENT 01/03/2021 30 60 g      Dispensed Days Supply Quantity Provider Pharmacy  [facility] 50  MG CAPSULE 08/04/2021 1 1 each      Dispensed Days Supply Quantity Provider Pharmacy  EPINEPHRINE 0.3 MG AUTO-INJECT 02/08/2020 30 2 each      Dispensed Days Supply Quantity Provider Pharmacy  FLUTICASONE PROPIONATE  50 MCG/ACT SUSP 03/12/2021 30 16 g      Dispensed Days Supply Quantity Provider Pharmacy  FLOVENT HFA 44 MCG INHALER 02/08/2020 30 10.6 g      Dispensed Days Supply Quantity Provider Pharmacy  LEVETIRACETAM 250 MG TABLET 12/16/2021 90 360 each      Dispensed Days Supply Quantity Provider Pharmacy  LEVOCETIRIZINE 5 MG TABLET 06/08/2019 30 30 each      Dispensed Days Supply Quantity Provider Pharmacy  LIDOCAINE HYDROCHLORIDE VISCOUS  2 % SOLN 03/21/2021 20 200 mL      Dispensed Days Supply Quantity Provider Pharmacy  METOPROLOL TARTRATE  25 MG TABS 06/19/2021 90 180 tablet      Dispensed Days Supply Quantity Provider Pharmacy  OLOPATADINE HCL 0.2% EYE DROP 07/31/2019 90 7.5 mL      Dispensed Days Supply Quantity Provider Pharmacy  KLOR-CON M20  20 MEQ TBCR 12/25/2021 90 180 tablet     Reviewed chart prior to disease state call. Spoke with patient regarding BP  Recent Office Vitals: BP Readings from Last 3  Encounters:  01/17/22 (!) 114/94  06/27/21 116/80  05/11/21 120/78   Pulse Readings from Last 3 Encounters:  01/17/22 76  06/27/21 64  05/11/21 64    Wt Readings from Last 3 Encounters:  01/17/22 152 lb 9.6 oz (69.2 kg)  06/27/21 145 lb (65.8 kg)  05/11/21 143 lb (64.9 kg)     Kidney Function Lab Results  Component Value Date/Time   CREATININE 0.65 05/04/2021 01:07 PM   CREATININE 0.81 03/09/2021 12:52 PM   CREATININE 0.9 05/27/2015 09:24 AM   CREATININE 0.9 02/23/2015 09:05 AM   GFR 85.78 05/24/2011 11:08 AM   GFRNONAA >60 05/04/2021 01:07 PM   GFRAA >60 05/19/2019 11:47 AM       Latest Ref Rng & Units 05/04/2021    1:07 PM 03/09/2021   12:52 PM 12/13/2020    2:05 PM  BMP  Glucose 70 - 99 mg/dL 83  95  103   BUN 8 - 23 mg/dL 14  16  15    Creatinine 0.44 - 1.00 mg/dL 0.65  0.81  0.75   Sodium 135 - 145 mmol/L 138  137  140   Potassium 3.5 - 5.1 mmol/L 3.8  4.4  4.1   Chloride 98 - 111 mmol/L 104  104  107   CO2 22 - 32 mmol/L 28  24  23    Calcium 8.9 - 10.3 mg/dL 9.2  9.4  9.6     Current antihypertensive regimen:  Clonidine 0.1 mg at bedtime Labetalol 100 mg 1/2 tablet twice daily Metoprolol 25 mg twice daily (patient states this has been discontinued due to raynaud's)  How often are you checking your Blood Pressure? Patient has been checking blood pressures about twice per week.   Current home BP readings: 02/06/2022 - 130/76 and 02/07/2022 - 115/78. Patient doesn't have any other readings available.  What recent interventions/DTPs have been made by any provider to improve Blood Pressure control since last CPP Visit: No recent interventions.  Any recent hospitalizations or ED visits since last visit with CPP? No recent hospital visits.   What diet changes have been made to improve Blood Pressure Control?  Patient follows low sodium, carb and sugar Breakfast -  patient will have 1/2 muffin, fruit and a protein drink Lunch - patient eats a lot of tuna salad,  crackers. Dinner - patient will have either chicken or fish, rice or potato and vegetable. She will skip dinner if she is not [place]  What exercise is being done to improve your Blood Pressure Control?  Patient walks 5 days per week and active all day everyday.  Adherence Review: Is the patient currently on Barrera/ARB medication? No Does the patient have >5 day gap between last estimated fill dates? No  Care Gaps: AWV - scheduled 03/15/2022 Last BP - 114/94 on 01/17/2022 Last A1C - 5.0 on 05/04/2021 [place] - never done AWV - due soon  Star Rating Drugs: None  [place] Pharmacist Assistant [phone]

## 2022-02-21 ENCOUNTER — Other Ambulatory Visit (HOSPITAL_COMMUNITY): Payer: Self-pay | Admitting: Physician Assistant

## 2022-02-21 ENCOUNTER — Ambulatory Visit: Payer: Self-pay | Admitting: Licensed Clinical Social Worker

## 2022-02-21 DIAGNOSIS — M87051 Idiopathic aseptic necrosis of right femur: Secondary | ICD-10-CM

## 2022-02-23 NOTE — Patient Instructions (Signed)
Visit Information  Thank you for taking time to visit with me today. Please don't hesitate to contact me if I can be of assistance to you.   Following are the goals we discussed today:   Goals Addressed             This Visit's Progress    Stress Management   On track    Care Coordination Interventions: Solution-Focused Strategies employed:  Mindfulness or Relaxation training provided Active listening / Reflection utilized  Emotional Support Provided Verbalization of feelings encouraged  Patient has spoken with mother via phone over holidays. Continues to endorse difficulty coping with family strain and very limited access to mother Patient was successful in obtaining an order for MRI with contrast. Expects appt to be scheduled mid January 2024 Patient continues to meet with CCM Pharmacist LCSW reviewed upcoming appts Patient practices mindfulness and receives continued encouragement from spouse LCSW discussed strategies to cope with current stressors. Encouraged journaling, continued open communication with spouse, etc          Our next appointment is by telephone on 04/04/22 at 11 AM  Please call the care guide team at [phone] if you need to cancel or reschedule your appointment.   If you are experiencing a Mental Health or [place] or need someone to talk to, please call the Suicide and Crisis Lifeline: 988 call 911   Patient verbalizes understanding of instructions and care plan provided today and agrees to view in [place]. Active MyChart status and patient understanding of how to access instructions and care plan via MyChart confirmed with patient.     [name], MSW, [email or web address] Phone [phone] 6:34 PM

## 2022-02-23 NOTE — Patient Outreach (Signed)
  Care Coordination Late Entry  Follow Up Visit Note   Outreach completed 02/21/22 Name: [name] MRN: [id number] DOB: December 04, 1955  [name] is a 66 y.o. year old female who sees Panosh, Standley Brooking, MD for primary care. I spoke with  [name] by phone today.  What matters to the patients health and wellness today?  Stress Management    Goals Addressed             This Visit's Progress    Stress Management   On track    Care Coordination Interventions: Solution-Focused Strategies employed:  Mindfulness or Relaxation training provided Active listening / Reflection utilized  Emotional Support Provided Verbalization of feelings encouraged  Patient has spoken with mother via phone over holidays. Continues to endorse difficulty coping with family strain and very limited access to mother Patient was successful in obtaining an order for MRI with contrast. Expects appt to be scheduled mid January 2024 Patient continues to meet with CCM Pharmacist LCSW reviewed upcoming appts Patient practices mindfulness and receives continued encouragement from spouse LCSW discussed strategies to cope with current stressors. Encouraged journaling, continued open communication with spouse, etc          SDOH assessments and interventions completed:  No     Care Coordination Interventions:  Yes, provided   Follow up plan: Follow up call scheduled for 4-6 weeks    Encounter Outcome:  Pt. Visit Completed   [name], MSW, [email or web address] Phone [phone] 6:33 PM

## 2022-02-24 ENCOUNTER — Other Ambulatory Visit: Payer: Self-pay | Admitting: Family Medicine

## 2022-02-27 NOTE — Telephone Encounter (Signed)
Last OV: 01/17/22 CPE Next OV: none scheduled Last refill per [facility] 12/29/21 by Dr Sarajane Jews

## 2022-03-06 DIAGNOSIS — L821 Other seborrheic keratosis: Secondary | ICD-10-CM | POA: Diagnosis not present

## 2022-03-06 DIAGNOSIS — L570 Actinic keratosis: Secondary | ICD-10-CM | POA: Diagnosis not present

## 2022-03-06 DIAGNOSIS — Z85820 Personal history of malignant melanoma of skin: Secondary | ICD-10-CM | POA: Diagnosis not present

## 2022-03-06 DIAGNOSIS — D0472 Carcinoma in situ of skin of left lower limb, including hip: Secondary | ICD-10-CM | POA: Diagnosis not present

## 2022-03-06 DIAGNOSIS — D044 Carcinoma in situ of skin of scalp and neck: Secondary | ICD-10-CM | POA: Diagnosis not present

## 2022-03-06 DIAGNOSIS — L814 Other melanin hyperpigmentation: Secondary | ICD-10-CM | POA: Diagnosis not present

## 2022-03-06 DIAGNOSIS — Z85828 Personal history of other malignant neoplasm of skin: Secondary | ICD-10-CM | POA: Diagnosis not present

## 2022-03-15 ENCOUNTER — Telehealth (INDEPENDENT_AMBULATORY_CARE_PROVIDER_SITE_OTHER): Payer: Medicare Other | Admitting: Family Medicine

## 2022-03-15 ENCOUNTER — Encounter: Payer: Self-pay | Admitting: Family Medicine

## 2022-03-15 VITALS — Ht 63.0 in | Wt 148.0 lb

## 2022-03-15 DIAGNOSIS — Z0000 Encounter for general adult medical examination without abnormal findings: Secondary | ICD-10-CM

## 2022-03-15 NOTE — Progress Notes (Signed)
PATIENT CHECK-IN and HEALTH RISK ASSESSMENT QUESTIONNAIRE:  -completed by phone/video for upcoming Medicare Preventive Visit  Pre-Visit Check-in: 1)Vitals (height, wt, BP, etc) - record in vitals section for visit on day of visit 2)Review and Update Medications, Allergies PMH, Surgeries, Social history in Epic 3)Hospitalizations in the last year with date/reason? No  4)Review and Update Care Team (patient's specialists) in Epic 5) Complete PHQ9 in Epic  6) Complete Fall Screening in Epic 7)Review all Health Maintenance Due and order under PCP if not done.  8)Medicare Wellness Questionnaire: Answer theses question about your habits: Do you drink alcohol? No  If yes, how many drinks do you have a day?O Have you ever smoked?no Quit date if applicable? N/a  How many packs a day do/did you smoke? N/a Do you use smokeless tobacco? no Do you use an illicit drugs?no Do you exercises? Yes IF so, what type and how many days/minutes per week?walk 3 days a week 2-5 miles, walks outside, also does some stretching and balance exercises Are you sexually active? Yes Number of partners?1 Typical breakfast steell cut oaks, fruits and muffins Typical lunch skip or a snack  Typical dinner protein, no red meat, fish chicken 2 vegetable and a carb Typical snacks: protein whole grain cookie  Beverages: 1/2 tea/ ginger ale  Answer theses question about you: Can you perform most household chores?yes  Do you find it hard to follow a conversation in a noisy room? yes (after 2 brain surgeries) Do you often ask people to speak up or repeat themselves?No Do you feel that you have a problem with memory?No  Do you balance your checkbook and or bank acounts?Yes  Do you feel safe at home?yes Last dentist visit?12/2021 Do you need assistance with any of the following: Please note if so   Driving? No   Feeding yourself? No   Getting from bed to chair? No   Getting to the toilet?no   Bathing or showering? No    Dressing yourself? No   Managing money? No   Climbing a flight of stairs no   Preparing meals? No   Do you have Advanced Directives in place (Living Will, Healthcare Power or Attorney)? yes   Last eye Exam and location? 01/2022 [facility] (records requested)    Do you currently use prescribed or non-prescribed narcotic or opioid pain medications?no    Nurse/Assistant Credentials/time stamp:  Lailana Shira,CMA  12 pm  ----------------------------------------------------------------------------------------------------------------------------------------------------------------------------------------------------------------------   MEDICARE ANNUAL PREVENTIVE VISIT WITH PROVIDER: (Welcome to [employer], initial annual wellness or annual wellness exam)  Virtual Visit via Video Note  I connected with Bali  on 03/15/22 by a video enabled telemedicine application and verified that I am speaking with the correct person using two identifiers.  Location patient: home Location provider:work or home office Persons participating in the virtual visit: patient, provider  Concerns and/or follow up today: hip and skins   See HM section in Epic for other details of completed HM.    ROS: negative for report of fevers, unintentional weight loss, vision changes, vision loss, hearing loss or change, chest pain, sob, hemoptysis, melena, hematochezia, hematuria, genital discharge or lesions, falls, bleeding or bruising, loc, thoughts of suicide or self harm, memory loss  Patient-completed extensive health risk assessment - reviewed and discussed with the patient: See Health Risk Assessment completed with patient prior to the visit either above or in recent phone note. This was reviewed in detailed with the patient today and appropriate recommendations, orders and referrals were placed  as needed per Summary below and patient instructions.   Review of Medical History: -PMH, PSH, Family  History and current specialty and care providers reviewed and updated and listed below   Patient Care Team: Panosh, Neta Mends, MD as PCP - Lucia Bitter, MD as Consulting Physician (Neurosurgery) Ladene Artist, MD as Consulting Physician (Oncology) Dan Europe, Scarlette Calico, MD as Referring Physician (Internal Medicine) Flo Shanks, MD as Consulting Physician (Otolaryngology) Arminda Resides, MD as Consulting Physician (Dermatology) Verner Chol, [facility] (Inactive) as Pharmacist (Pharmacist) Irena Cords, Enzo Montgomery, MD as Consulting Physician (Allergy and Immunology)   Past Medical History:  Diagnosis Date   Anemia    Angio-edema    Anxiety    Arthritis    Asthma    Atrial fibrillation [facility])    Atrial tachycardia    Autonomic dysfunction    Cervicalgia    Complication of anesthesia    pt states wakes up with "shakes"   CVA (cerebral infarction)    2012 with dizziness and vision change felt embolic from atrial tachy   Disorder of bone and cartilage, unspecified    Disturbance of skin sensation    Diverticulosis    DM (diabetes mellitus) (HCC)    DVT (deep venous thrombosis) (HCC)    DVT (deep venous thrombosis) (HCC)    Eczema    Family history of anesthesia complication    PONV and " shaking "   Fatty liver    GERD (gastroesophageal reflux disease)    History of hiatal hernia    History of pacemaker    History of radiation therapy 10/24/2012   brain   HLD (hyperlipidemia)    HTN (hypertension)    Hypoglycemia, unspecified    Injury to unspecified nerve of shoulder girdle and upper limb    Liver metastasis    Lung metastasis    Metastasis to brain (HCC)    Osteopenia    Other nonspecific abnormal serum enzyme levels    Other specified congenital anomalies of nervous system    Pacemaker    autonomic dysfunction   Pancreatitis    from therapy   Peripheral vascular disease (HCC)    PONV (postoperative nausea and vomiting)    POTS (postural orthostatic  tachycardia syndrome)    Recurrent upper respiratory infection (URI)    Skin cancer    Hx: of lung lesion   Stroke (HCC)    left weaker    Swelling of limb    Urticaria     Past Surgical History:  Procedure Laterality Date   ABLATION SAPHENOUS VEIN W/ RFA     2002 x3   ADENOIDECTOMY     CARPAL TUNNEL RELEASE Left    CHOLECYSTECTOMY  03/28/2018   CHOLECYSTECTOMY N/A 03/28/2018   Procedure: LAPAROSCOPIC CHOLECYSTECTOMY WITH INTRAOPERATIVE CHOLANGIOGRAM;  Surgeon: Luretha Murphy, MD;  Location: [facility] OR;  Service: General;  Laterality: N/A;   CRANIOTOMY N/A 09/30/2012   suboccipital craniectomy   CRANIOTOMY Left 02/09/2013   Procedure: LEFT PARIETAL CRANIOTOMY with stealth;  Surgeon: Barnett Abu, MD;  Location: [facility] OR[facility];  Service: Neurosurgery;  Laterality: Left;  LEFT Parietal Craniotomy for tumor with stealth   DEEP AXILLARY SENTINEL NODE BIOPSY / EXCISION     due to extensive Melanoma-right arm   fatty tumor removed     from chest   INSERT / REPLACE / REMOVE PACEMAKER  2012   1999, x 3   LAPAROSCOPY  09/06/2011   Procedure: LAPAROSCOPY OPERATIVE;  Surgeon: Tresa Endo A. Ernestina Penna, MD;  Location: [facility] ORS;  Service: Gynecology;  Laterality: Left;  with Left Ovarian Cystectomy    MELANOMA EXCISION     with removal of lymph nodes, left shoulder   NOSE SURGERY  2010, 2012   for nose bleeds x 2   PACEMAKER IMPLANT     SINOSCOPY     TONSILLECTOMY AND ADENOIDECTOMY      Social History   Socioeconomic History   Marital status: Married    Spouse name: Not on file   Number of children: 0   Years of education: Not on file   Highest education level: Not on file  Occupational History   Occupation: PRESIDENT    Employer: [employer]    Comment: self employed  Tobacco Use   Smoking status: Never   Smokeless tobacco: Never  Vaping Use   Vaping Use: Never used  Substance and Sexual Activity   Alcohol use: Never    Comment: glass of wine daily   Drug use: No   Sexual  activity: Not on file  Other Topics Concern   Not on file  Social History Narrative   4 years college hh of 2    Neg tad   Social Determinants of Health   Financial Resource Strain: Low Risk  (11/22/2021)   Overall Financial Resource Strain (CARDIA)    Difficulty of Paying Living Expenses: Not very hard  Food Insecurity: No Food Insecurity (03/14/2021)   Hunger Vital Sign    Worried About Running Out of Food in the Last Year: Never true    Ran Out of Food in the Last Year: Never true  Transportation Needs: Unknown (12/27/2021)   PRAPARE - Administrator, Civil Service (Medical): No    Lack of Transportation (Non-Medical): Not on file  Physical Activity: Sufficiently Active (03/14/2021)   Exercise Vital Sign    Days of Exercise per Week: 4 days    Minutes of Exercise per Session: 90 min  Stress: No Stress Concern Present (03/14/2021)   [employer] of Occupational Health - Occupational Stress Questionnaire    Feeling of Stress : Not at all  Recent Concern: Stress - Stress Concern Present (12/26/2020)   [employer] of Occupational Health - Occupational Stress Questionnaire    Feeling of Stress : To some extent  Social Connections: Socially Integrated (03/14/2021)   Social Connection and Isolation Panel [NHANES]    Frequency of Communication with Friends and Family: More than three times a week    Frequency of Social Gatherings with Friends and Family: More than three times a week    Attends Religious Services: More than 4 times per year    Active Member of [employer] or Organizations: Yes    Attends Engineer, structural: More than 4 times per year    Marital Status: Married  Catering manager Violence: Not At Risk (03/14/2021)   Humiliation, Afraid, Rape, and Kick questionnaire    Fear of Current or Ex-Partner: No    Emotionally Abused: No    Physically Abused: No    Sexually Abused: No    Family History  Problem Relation Age of Onset   Allergic  rhinitis Sister    Stroke Mother    Colon polyps Mother    Colon cancer Mother    Urticaria Mother    Allergic rhinitis Mother    Heart disease Father    Diabetes Father    Kidney disease Father    Diabetes Maternal Grandmother    Clotting  disorder Maternal Grandmother        stroke   Crohn's disease Maternal Grandmother    Diabetes Paternal Grandmother    Aneurysm Sister        brain    Current Outpatient Medications on File Prior to Visit  Medication Sig Dispense Refill   acetaminophen (TYLENOL) 500 MG tablet Take 500 mg by mouth every 6 (six) hours as needed for moderate pain.     albuterol (PROVENTIL) (2.5 MG/3ML) 0.083% nebulizer solution Take 3 mLs (2.5 mg total) by nebulization every 4 (four) hours as needed for wheezing or shortness of breath. 180 mL 1   albuterol (VENTOLIN HFA) 108 (90 Base) MCG/ACT inhaler INHALE 2 PUFFS INTO THE LUNGS EVERY 4 (FOUR) HOURS AS NEEDED FOR WHEEZING OR SHORTNESS OF BREATH. 18 Inhaler 0   augmented betamethasone dipropionate (DIPROLENE) 0.05 % ointment Apply topically 2 (two) times daily. Limit to no longer than 2 weeks use. Not on face. 50 g 0   benzonatate (TESSALON) 200 MG capsule Take 1 capsule (200 mg total) by mouth 2 (two) times daily as needed for cough. 20 capsule 0   budesonide (ENTOCORT EC) 3 MG 24 hr capsule as needed for rash.     Calcium Alginate POWD Take 1 tablet by mouth daily.     calcium carbonate (TUMS - DOSED IN MG ELEMENTAL CALCIUM) 500 MG chewable tablet Chew 2 tablets by mouth daily as needed for indigestion or heartburn.     celecoxib (CELEBREX) 200 MG capsule Take 200 mg by mouth daily.     cephALEXin (KEFLEX) 500 MG capsule Take 1 capsule (500 mg total) by mouth 3 (three) times daily. If needed for UTI 21 capsule 0   chlorhexidine (PERIDEX) 0.12 % solution as needed. thrash     cholestyramine (QUESTRAN) 4 g packet Take 4 g by mouth 2 (two) times daily.     clobetasol cream (TEMOVATE) 4.09 % Apply 1 application topically  2 (two) times daily as needed (rash).     clonazePAM (KLONOPIN) 1 MG tablet TAKE 1 TABLET BY MOUTH TWICE A DAY MAY TAKE UP TO 3 TIMES DAILY IF NEEDED FOR EXTRA ANXIETY 60 tablet 0   cloNIDine (CATAPRES) 0.1 MG tablet Take 1 tablet (0.1 mg total) by mouth at bedtime. 90 tablet 3   cyanocobalamin (VITAMIN B12) 1000 MCG/ML injection INJECT 1 ML (1,000 MCG TOTAL) INTO THE MUSCLE EVERY 30 DAYS. 3 mL 4   Desoximetasone (TOPICORT) 0.25 % ointment Apply 1 application topically 2 (two) times daily. (Patient taking differently: Apply 1 application  topically 2 (two) times daily as needed (rash).) 30 g 1   diphenhydrAMINE (BENADRYL) 25 MG tablet Take 25 mg by mouth daily as needed for allergies.     EPINEPHrine 0.3 mg/0.3 mL IJ SOAJ injection Inject 0.3 mg into the muscle as needed for anaphylaxis.     fluorouracil (EFUDEX) 5 % cream Apply 1 application topically 2 (two) times daily as needed (rash).     fluticasone (FLONASE) 50 MCG/ACT nasal spray Place 2 sprays into both nostrils daily. 16 g 0   fluticasone (FLOVENT HFA) 44 MCG/ACT inhaler Inhale 2 puffs into the lungs 2 (two) times daily as needed (shortness of breath).     hydrocortisone (ANUSOL-HC) 25 MG suppository Place rectally as needed.     hyoscyamine (LEVSIN SL) 0.125 MG SL tablet Place 0.125 mg under the tongue every 6 (six) hours as needed for cramping.     labetalol (NORMODYNE) 100 MG tablet Take 0.5 tablets (  50 mg total) by mouth 2 (two) times daily. 90 tablet 3   levETIRAcetam (KEPPRA) 250 MG tablet Take 250 mg by mouth See admin instructions. Take 250mg  three times a day at 6am, 12pm, 6pm.     levocetirizine (XYZAL) 5 MG tablet TAKE 1 TABLET BY MOUTH EVERY DAY AS NEEDED (Patient taking differently: Take 5 mg by mouth daily as needed for allergies.) 30 tablet 0   lidocaine (XYLOCAINE) 2 % solution 72mL swish and swallow as needed for sore throat; take with nystatin susp 200 mL 0   lipase/protease/amylase (CREON) 12000 units CPEP capsule Take  12,000 Units by mouth daily with breakfast.     metoprolol tartrate (LOPRESSOR) 25 MG tablet 1 tablet with food Orally Twice a day for 30 day(s)     nystatin (MYCOSTATIN) 100000 UNIT/ML suspension Use as directed 5 mLs (500,000 Units total) in the mouth or throat 3 (three) times daily as needed (thrush). 60 mL 2   Olopatadine HCl (PATADAY) 0.2 % SOLN Use one drop in each eye once daily as needed. (Patient taking differently: Place 1 drop into both eyes daily as needed (itchy eyes).) 2.5 mL 5   ondansetron (ZOFRAN-ODT) 4 MG disintegrating tablet as needed for nausea/vomiting.     polyethylene glycol (MIRALAX / GLYCOLAX) packet Take 17 g by mouth at bedtime.     potassium chloride SA (KLOR-CON M) 20 MEQ tablet Take 1 tablet (20 mEq total) by mouth 2 (two) times daily. 180 tablet 2   prochlorperazine (COMPAZINE) 10 MG tablet Take 1 tablet (10 mg total) by mouth every 6 (six) hours as needed for nausea or vomiting. 60 tablet 0   sodium chloride (OCEAN) 0.65 % SOLN nasal spray Place 1 spray into both nostrils as needed for congestion.      Spacer/Aero-Holding Chambers ([place]) MISC See admin instructions.     Syringe/Needle, Disp, (SYRINGE 3CC/27GX1-1/4") 27G X 1-1/4" 3 ML MISC Inject 1 mL into the muscle every 30 (thirty) days. Vit B12 12 each 0   Vitamin D, Ergocalciferol, (DRISDOL) 50000 units CAPS capsule TAKE 1 CAPSULE (50,000 UNITS TOTAL) BY MOUTH EVERY 7 (SEVEN) DAYS. 4 capsule 11   No current facility-administered medications on file prior to visit.    Allergies  Allergen Reactions   Bee Venom Swelling   Doxycycline Hives   Erythromycin Hives   Gadavist [Gadobutrol] Hives, Shortness Of Breath and Itching   Paxlovid [Nirmatrelvir-Ritonavir] Other (See Comments)    Mouth swelling, mouth ulcers   Penicillins Hives    Did it involve swelling of the face/tongue/throat, SOB, or low BP? No Did it involve sudden or severe rash/hives, skin peeling, or any reaction on the inside of  your mouth or nose? Yes Did you need to seek medical attention at a hospital or doctor's office? Yes When did it last happen?      5+ years If all above answers are "NO", may proceed with cephalosporin use.    Sulfa Antibiotics Hives   Preparation H [Pramox-Pe-Glycerin-Petrolatum] Hives    Cannot use  Cream or Suppositories    Phenergan [Promethazine Hcl] Other (See Comments)    Causes restless leg syndrome       Physical Exam There were no vitals filed for this visit. Estimated body mass index is 26.22 kg/m as calculated from the following:   Height as of this encounter: 5\' 3"  (1.6 m).   Weight as of this encounter: 148 lb (67.1 kg).  EKG (optional): deferred due to virtual visit  GENERAL:  alert, oriented, appears well and in no acute distress; visual acuity grossly intact, full vision exam deferred due to pandemic and/or virtual encounter  HEENT: atraumatic, conjunttiva clear, no obvious abnormalities on inspection of external nose and ears  NECK: normal movements of the head and neck  LUNGS: on inspection no signs of respiratory distress, breathing rate appears normal, no obvious gross SOB, gasping or wheezing  CV: no obvious cyanosis  MS: moves all visible extremities without noticeable abnormality  PSYCH/NEURO: pleasant and cooperative, no obvious depression or anxiety, speech and thought processing grossly intact, Cognitive function grossly intact        03/15/2022   11:20 AM 03/14/2021    1:09 PM 03/22/2020   11:39 AM 03/15/2020    2:48 PM  Depression screen PHQ 2/9  Decreased Interest 0 0 0 0  Down, Depressed, Hopeless  0 0 0  PHQ - 2 Score 0 0 0 0       03/22/2020   11:44 AM 08/02/2020    6:21 PM 10/31/2020   11:46 AM 03/14/2021    1:12 PM 03/15/2022   11:20 AM  Fall Risk  Falls in the past year?    0 0  Was there an injury with Fall? 0   0 0  Fall Risk Category Calculator    0 0  Fall Risk Category (Retired)    Low   (RETIRED) Patient Fall Risk Level   Low fall risk Low fall risk Low fall risk   Patient at Risk for Falls Due to Impaired balance/gait;Impaired mobility;Impaired vision    No Fall Risks  Fall risk Follow up     Falls evaluation completed     SUMMARY AND PLAN:  Encounter for Medicare annual wellness exam   Discussed applicable health maintenance/preventive health measures and advised and referred or ordered per patient preferences:  Health Maintenance  Topic Date Due   DTaP/Tdap/Td (1 - Tdap) Due, she reports she will get when sees Dr. Fabian Sharp next   MAMMOGRAM  11/26/2010 (ordered already, she is aware)   PAP SMEAR-Modifier  03/15/2022 (Originally 12/30/1978)   INFLUENZA VACCINE  05/27/2022 (Originally 09/26/2021)   Hepatitis C Screening  03/16/2023 (Originally 12/30/1975)   HIV Screening  03/16/2023 (Originally 12/29/1972)   Diabetic kidney evaluation - Urine ACR  03/17/2027 (Originally 12/30/1975)   Zoster Vaccines- Shingrix (1 of 2) 06/14/2027 (Originally 12/29/1976)   Diabetic kidney evaluation - eGFR measurement  05/05/2022   Medicare Annual Wellness (AWV)  03/16/2023   COLONOSCOPY (Pts 45-85yrs Insurance coverage will need to be confirmed)  12/07/2026   HPV VACCINES  Aged Out   COVID-19 Vaccine  Discontinued  She reports she does not have diabetes. On review of chart Hgba1cs have been excellent for the most part - likely due to healthy lifestyle - may have had prediabetes/diabetes in the past. She wanted to defer many of the screenings as noted above per her preference.    Education and counseling on the following was provided based on the above review of health and a plan/checklist for the patient, along with additional information discussed, was provided for the patient in the patient instructions :   -Provided counseling and plan for increased risk of falling if applicable per above screening. She is limited in ability to do balance exercises currently due to hip issues - seeing ortho. Discussed safe gentle exercises  she could do to help prevent falls and advised she hold on at all times doing them given her increase risk for consequences  if falls.  -Discussed healthy lifestyle -She is doing a great job with her diet - avoids processed foods and junk. Advised and counseled on a whole foods based healthy diet and regular exercise: A summary of a healthy diet was provided in the Patient Instructions.  -Recommended continued regular exercise  -Advise yearly dental visits at minimum and regular eye exams   Follow up: see patient instructions     Patient Instructions  I really enjoyed getting to talk with you today! I am available on Tuesdays and Thursdays for virtual visits if you have any questions or concerns, or if I can be of any further assistance.   CHECKLIST FROM ANNUAL WELLNESS VISIT:  -Follow up (please call to schedule if not scheduled after visit):   -yearly for annual wellness visit with primary care office  Here is a list of your preventive care/health maintenance measures and the plan for each if any are due: Some have been deferred per your preference. Please let us know if any questions.  Health Maintenance  Topic Date Due   DTaP/Tdap/Td (1 - Tdap) Never done, can get at pharmacy or at office   MAMMOGRAM  11/26/2010, ordered prior   PAP SMEAR-Modifier  03/15/2022 (Originally 12/30/1978)   INFLUENZA VACCINE  05/27/2022 (Originally 09/26/2021)   Hepatitis C Screening  03/16/2023 (Originally 12/30/1975)   HIV Screening  03/16/2023 (Originally 12/29/1972)   Diabetic kidney evaluation - Urine ACR  03/17/2027 (Originally 12/30/1975)   Zoster Vaccines- Shingrix (1 of 2) 06/14/2027 (Originally 12/29/1976)   Diabetic kidney evaluation - eGFR measurement  05/05/2022   Medicare Annual Wellness (AWV)  03/16/2023   COLONOSCOPY (Pts 45-51yrs Insurance coverage will need to be confirmed)  12/07/2026   HPV VACCINES  Aged Out   COVID-19 Vaccine  Discontinued    -See a dentist at least yearly  -Get  your eyes checked and then per your eye specialist's recommendations  -Other issues addressed today:  -I have included below further information regarding a healthy whole foods based diet, physical activity guidelines for adults, stress management and opportunities for social connections. You are already doing great with most of these!  -----------------------------------------------------------------------------------------------------------------------------------------------------------------------------------------------------------------------------------------------------------  NUTRITION: -eat real food: lots of colorful vegetables (half the plate) and fruits -5-7 servings of vegetables and fruits per day (fresh or steamed is best), exp. 2 servings of vegetables with lunch and dinner and 2 servings of fruit per day. Berries and greens such as kale and collards are great choices.  -consume on a regular basis: whole grains (make sure first ingredient on label contains the word "whole"), fresh fruits, fish, nuts, seeds, healthy oils (such as olive oil, avocado oil, grape seed oil) -may eat small amounts of dairy and lean meat on occasion, but avoid processed meats such as ham, bacon, lunch meat, etc. -drink water -try to avoid fast food and pre-packaged foods, processed meat -most experts advise limiting sodium to < 2300mg  per day, should limit further is any chronic conditions such as high blood pressure, heart disease, diabetes, etc. The American Heart Association advised that < 1500mg  is is ideal -try to avoid foods that contain any ingredients with names you do not recognize  -try to avoid sugar/sweets (except for the natural sugar that occurs in fresh fruit) -try to avoid sweet drinks -try to avoid white rice, white bread, pasta (unless whole grain), white or yellow potatoes  EXERCISE GUIDELINES FOR ADULTS: -if you wish to increase your physical activity, do so gradually and with the  approval of your  doctor -STOP and seek medical care immediately if you have any chest pain, chest discomfort or trouble breathing when starting or increasing exercise  -move and stretch your body, legs, feet and arms when sitting for long periods -Physical activity guidelines for optimal health in adults: -least 150 minutes per week of aerobic exercise (can talk, but not sing) once approved by your doctor, 20-30 minutes of sustained activity or two 10 minute episodes of sustained activity every day.  -resistance training at least 2 days per week if approved by your doctor -balance exercises 3+ days per week:   Stand somewhere where you have something sturdy to hold onto if you lose balance.    1) lift up on toes, start with 5x per day and work up to 20x   2) stand and lift on leg straight out to the side so that foot is a few inches of the floor, start with 5x each side and work up to 20x each side   3) stand on one foot, start with 5 seconds each side and work up to 20 seconds on each side  If you need ideas or help with getting more active:  -Silver sneakers https://tools.silversneakers.com  -Walk with a Doc: http://www.duncan-[email or web address]  -try to include resistance (weight lifting/strength building) and balance exercises twice per week: or the following link for ideas: http://castillo-powell.com/  BuyDucts.dk  STRESS MANAGEMENT: -can try meditating, or just sitting quietly with deep breathing while intentionally relaxing all parts of your body for 5 minutes daily -if you need further help with stress, anxiety or depression please follow up with your primary doctor or contact the wonderful folks at [facility]: [phone]  SOCIAL CONNECTIONS: -options in [place] if you wish to engage in more social and exercise related activities:  -Silver  sneakers https://tools.[email or web address]  -Walk with a Doc: http://www.duncan-[email or web address]/  -Check out the [facility] Active Adults 50+ section on the [place] of [employer] (hiking clubs, book clubs, cards and games, chess, exercise classes, aquatic classes and much more) - see the website for details: https://www.[place].gov/departments/parks-recreation/active-adults50  -YouTube has lots of exercise videos for different ages and abilities as well  -[facility] (a variety of indoor and outdoor inperson activities for adults). [phone]. [address].  -Virtual Online Classes (a variety of topics): see seniorplanet.org or call [phone]  -consider volunteering at a school, hospice center, church, senior center or elsewhere           Allean Found, CMA

## 2022-03-15 NOTE — Patient Instructions (Addendum)
I really enjoyed getting to talk with you today! I am available on Tuesdays and Thursdays for virtual visits if you have any questions or concerns, or if I can be of any further assistance.   CHECKLIST FROM ANNUAL WELLNESS VISIT:  -Follow up (please call to schedule if not scheduled after visit):   -yearly for annual wellness visit with primary care office  Here is a list of your preventive care/health maintenance measures and the plan for each if any are due: Some have been deferred per your preference. Please let us know if any questions.  Health Maintenance  Topic Date Due   DTaP/Tdap/Td (1 - Tdap) Never done, can get at pharmacy or at office   MAMMOGRAM  11/26/2010, ordered prior   PAP SMEAR-Modifier  03/15/2022 (Originally 12/30/1978)   INFLUENZA VACCINE  05/27/2022 (Originally 09/26/2021)   Hepatitis C Screening  03/16/2023 (Originally 12/30/1975)   HIV Screening  03/16/2023 (Originally 12/29/1972)   Diabetic kidney evaluation - Urine ACR  03/17/2027 (Originally 12/30/1975)   Zoster Vaccines- Shingrix (1 of 2) 06/14/2027 (Originally 12/29/1976)   Diabetic kidney evaluation - eGFR measurement  05/05/2022   Medicare Annual Wellness (AWV)  03/16/2023   COLONOSCOPY (Pts 45-81yrs Insurance coverage will need to be confirmed)  12/07/2026   HPV VACCINES  Aged Out   COVID-19 Vaccine  Discontinued    -See a dentist at least yearly  -Get your eyes checked and then per your eye specialist's recommendations  -Other issues addressed today:  -I have included below further information regarding a healthy whole foods based diet, physical activity guidelines for adults, stress management and opportunities for social connections. You are already doing great with most of  these!  -----------------------------------------------------------------------------------------------------------------------------------------------------------------------------------------------------------------------------------------------------------  NUTRITION: -eat real food: lots of colorful vegetables (half the plate) and fruits -5-7 servings of vegetables and fruits per day (fresh or steamed is best), exp. 2 servings of vegetables with lunch and dinner and 2 servings of fruit per day. Berries and greens such as kale and collards are great choices.  -consume on a regular basis: whole grains (make sure first ingredient on label contains the word "whole"), fresh fruits, fish, nuts, seeds, healthy oils (such as olive oil, avocado oil, grape seed oil) -may eat small amounts of dairy and lean meat on occasion, but avoid processed meats such as ham, bacon, lunch meat, etc. -drink water -try to avoid fast food and pre-packaged foods, processed meat -most experts advise limiting sodium to < 2300mg  per day, should limit further is any chronic conditions such as high blood pressure, heart disease, diabetes, etc. The American Heart Association advised that < 1500mg  is is ideal -try to avoid foods that contain any ingredients with names you do not recognize  -try to avoid sugar/sweets (except for the natural sugar that occurs in fresh fruit) -try to avoid sweet drinks -try to avoid white rice, white bread, pasta (unless whole grain), white or yellow potatoes  EXERCISE GUIDELINES FOR ADULTS: -if you wish to increase your physical activity, do so gradually and with the approval of your doctor -STOP and seek medical care immediately if you have any chest pain, chest discomfort or trouble breathing when starting or increasing exercise  -move and stretch your body, legs, feet and arms when sitting for long periods -Physical activity guidelines for optimal health in adults: -least 150 minutes per  week of aerobic exercise (can talk, but not sing) once approved by your doctor, 20-30 minutes of sustained activity or two 10 minute episodes of sustained  activity every day.  -resistance training at least 2 days per week if approved by your doctor -balance exercises 3+ days per week:   Stand somewhere where you have something sturdy to hold onto if you lose balance.    1) lift up on toes, start with 5x per day and work up to 20x   2) stand and lift on leg straight out to the side so that foot is a few inches of the floor, start with 5x each side and work up to 20x each side   3) stand on one foot, start with 5 seconds each side and work up to 20 seconds on each side  If you need ideas or help with getting more active:  -Silver sneakers https://tools.silversneakers.com  -Walk with a Doc: http://www.duncan-[email or web address]/  -try to include resistance (weight lifting/strength building) and balance exercises twice per week: or the following link for ideas: http://castillo-powell.com/  BuyDucts.dk  STRESS MANAGEMENT: -can try meditating, or just sitting quietly with deep breathing while intentionally relaxing all parts of your body for 5 minutes daily -if you need further help with stress, anxiety or depression please follow up with your primary doctor or contact the wonderful folks at [facility]: [phone]  SOCIAL CONNECTIONS: -options in [place] if you wish to engage in more social and exercise related activities:  -Silver sneakers https://tools.silversneakers.com  -Walk with a Doc: http://www.duncan-[email or web address]/  -Check out the [facility] Active Adults 50+ section on the [place] of [employer] (hiking clubs, book clubs, cards and games, chess, exercise classes, aquatic classes and much more) - see the website for  details: https://www.[place].gov/departments/parks-recreation/active-adults50  -YouTube has lots of exercise videos for different ages and abilities as well  -[facility] (a variety of indoor and outdoor inperson activities for adults). [phone]. [address]  -Virtual Online Classes (a variety of topics): see seniorplanet.org or call [phone]  -consider volunteering at a school, hospice center, church, senior center or elsewhere

## 2022-03-15 NOTE — Progress Notes (Signed)
PATIENT CHECK-IN and HEALTH RISK ASSESSMENT QUESTIONNAIRE:  -completed by phone/video for upcoming Medicare Preventive Visit  Pre-Visit Check-in: 1)Vitals (height, wt, BP, etc) - record in vitals section for visit on day of visit 2)Review and Update Medications, Allergies PMH, Surgeries, Social history in Epic 3)Hospitalizations in the last year with date/reason? No  4)Review and Update Care Team (patient's specialists) in Epic 5) Complete PHQ9 in Epic  6) Complete Fall Screening in Epic 7)Review all Health Maintenance Due and order under PCP if not done.  8)Medicare Wellness Questionnaire: Answer theses question about your habits: Do you drink alcohol? No  If yes, how many drinks do you have a day?O Have you ever smoked?no Quit date if applicable? N/a  How many packs a day do/did you smoke? N/a Do you use smokeless tobacco? no Do you use an illicit drugs?no Do you exercises? Yes IF so, what type and how many days/minutes per week?walk 3 days a week 2-5 miles, walks outside, also does some stretching and balance exercises Are you sexually active? Yes Number of partners?1 Typical breakfast steell cut oaks, fruits and muffins Typical lunch skip or a snack  Typical dinner protein, no red meat, fish chicken 2 vegetable and a carb Typical snacks: protein whole grain cookie  Beverages: 1/2 tea/ ginger ale  Answer theses question about you: Can you perform most household chores?yes  Do you find it hard to follow a conversation in a noisy room? yes (after 2 brain surgeries) Do you often ask people to speak up or repeat themselves?No Do you feel that you have a problem with memory?No  Do you balance your checkbook and or bank acounts?Yes  Do you feel safe at home?yes Last dentist visit?12/2021 Do you need assistance with any of the following: Please note if so   Driving? No   Feeding yourself? No   Getting from bed to chair? No   Getting to the toilet?no   Bathing or showering? No    Dressing yourself? No   Managing money? No   Climbing a flight of stairs no   Preparing meals? No   Do you have Advanced Directives in place (Living Will, Healthcare Power or Attorney)? yes   Last eye Exam and location? 01/2022 [facility] (records requested)    Do you currently use prescribed or non-prescribed narcotic or opioid pain medications?no    Nurse/Assistant Credentials/time stamp:   ----------------------------------------------------------------------------------------------------------------------------------------------------------------------------------------------------------------------   MEDICARE ANNUAL PREVENTIVE VISIT WITH PROVIDER: (Welcome to [employer], initial annual wellness or annual wellness exam)  Virtual Visit via Video Note  I connected with Marcedes  on 03/15/22 by a video enabled telemedicine application and verified that I am speaking with the correct person using two identifiers.  Location patient: home Location provider:work or home office Persons participating in the virtual visit: patient, provider  Concerns and/or follow up today: hip and skins   See HM section in Epic for other details of completed HM.    ROS: negative for report of fevers, unintentional weight loss, vision changes, vision loss, hearing loss or change, chest pain, sob, hemoptysis, melena, hematochezia, hematuria, genital discharge or lesions, falls, bleeding or bruising, loc, thoughts of suicide or self harm, memory loss  Patient-completed extensive health risk assessment - reviewed and discussed with the patient: See Health Risk Assessment completed with patient prior to the visit either above or in recent phone note. This was reviewed in detailed with the patient today and appropriate recommendations, orders and referrals were placed as needed per Summary  below and patient instructions.   Review of Medical History: -PMH, PSH, Family History and current  specialty and care providers reviewed and updated and listed below   Patient Care Team: Panosh, Neta Mends, MD as PCP - Lucia Bitter, MD as Consulting Physician (Neurosurgery) Ladene Artist, MD as Consulting Physician (Oncology) Dan Europe, Scarlette Calico, MD as Referring Physician (Internal Medicine) Flo Shanks, MD as Consulting Physician (Otolaryngology) Arminda Resides, MD as Consulting Physician (Dermatology) Verner Chol, [facility] (Inactive) as Pharmacist (Pharmacist) Irena Cords, Enzo Montgomery, MD as Consulting Physician (Allergy and Immunology)   Past Medical History:  Diagnosis Date   Anemia    Angio-edema    Anxiety    Arthritis    Asthma    Atrial fibrillation [facility])    Atrial tachycardia    Autonomic dysfunction    Cervicalgia    Complication of anesthesia    pt states wakes up with "shakes"   CVA (cerebral infarction)    2012 with dizziness and vision change felt embolic from atrial tachy   Disorder of bone and cartilage, unspecified    Disturbance of skin sensation    Diverticulosis    DM (diabetes mellitus) (HCC)    DVT (deep venous thrombosis) (HCC)    DVT (deep venous thrombosis) (HCC)    Eczema    Family history of anesthesia complication    PONV and " shaking "   Fatty liver    GERD (gastroesophageal reflux disease)    History of hiatal hernia    History of pacemaker    History of radiation therapy 10/24/2012   brain   HLD (hyperlipidemia)    HTN (hypertension)    Hypoglycemia, unspecified    Injury to unspecified nerve of shoulder girdle and upper limb    Liver metastasis    Lung metastasis    Metastasis to brain (HCC)    Osteopenia    Other nonspecific abnormal serum enzyme levels    Other specified congenital anomalies of nervous system    Pacemaker    autonomic dysfunction   Pancreatitis    from therapy   Peripheral vascular disease (HCC)    PONV (postoperative nausea and vomiting)    POTS (postural orthostatic tachycardia syndrome)     Recurrent upper respiratory infection (URI)    Skin cancer    Hx: of lung lesion   Stroke (HCC)    left weaker    Swelling of limb    Urticaria     Past Surgical History:  Procedure Laterality Date   ABLATION SAPHENOUS VEIN W/ RFA     2002 x3   ADENOIDECTOMY     CARPAL TUNNEL RELEASE Left    CHOLECYSTECTOMY  03/28/2018   CHOLECYSTECTOMY N/A 03/28/2018   Procedure: LAPAROSCOPIC CHOLECYSTECTOMY WITH INTRAOPERATIVE CHOLANGIOGRAM;  Surgeon: Luretha Murphy, MD;  Location[facility];  Service: General;  Laterality: N/A;   CRANIOTOMY N/A 09/30/2012   suboccipital craniectomy   CRANIOTOMY Left 02/09/2013   Procedure: LEFT PARIETAL CRANIOTOMY with stealth;  Surgeon: Barnett Abu, MD;  Location: [facility] OR[facility];  Service: Neurosurgery;  Laterality: Left;  LEFT Parietal Craniotomy for tumor with stealth   DEEP AXILLARY SENTINEL NODE BIOPSY / EXCISION     due to extensive Melanoma-right arm   fatty tumor removed     from chest   INSERT / REPLACE / REMOVE PACEMAKER  2012   1999, x 3   LAPAROSCOPY  09/06/2011   Procedure: LAPAROSCOPY OPERATIVE;  Surgeon: Tresa Endo A. Ernestina Penna, MD;  Location: [facility] ORS;  Service: Gynecology;  Laterality: Left;  with Left Ovarian Cystectomy    MELANOMA EXCISION     with removal of lymph nodes, left shoulder   NOSE SURGERY  2010, 2012   for nose bleeds x 2   PACEMAKER IMPLANT     SINOSCOPY     TONSILLECTOMY AND ADENOIDECTOMY      Social History   Socioeconomic History   Marital status: Married    Spouse name: Not on file   Number of children: 0   Years of education: Not on file   Highest education level: Not on file  Occupational History   Occupation: PRESIDENT    Employer: [employer]    Comment: self employed  Tobacco Use   Smoking status: Never   Smokeless tobacco: Never  Vaping Use   Vaping Use: Never used  Substance and Sexual Activity   Alcohol use: Never    Comment: glass of wine daily   Drug use: No   Sexual activity: Not on file   Other Topics Concern   Not on file  Social History Narrative   4 years college hh of 2    Neg tad   Social Determinants of Health   Financial Resource Strain: Low Risk  (11/22/2021)   Overall Financial Resource Strain (CARDIA)    Difficulty of Paying Living Expenses: Not very hard  Food Insecurity: No Food Insecurity (03/14/2021)   Hunger Vital Sign    Worried About Running Out of Food in the Last Year: Never true    Ran Out of Food in the Last Year: Never true  Transportation Needs: Unknown (12/27/2021)   PRAPARE - Administrator, Civil Service (Medical): No    Lack of Transportation (Non-Medical): Not on file  Physical Activity: Sufficiently Active (03/14/2021)   Exercise Vital Sign    Days of Exercise per Week: 4 days    Minutes of Exercise per Session: 90 min  Stress: No Stress Concern Present (03/14/2021)   [employer] of Occupational Health - Occupational Stress Questionnaire    Feeling of Stress : Not at all  Recent Concern: Stress - Stress Concern Present (12/26/2020)   [employer] of Occupational Health - Occupational Stress Questionnaire    Feeling of Stress : To some extent  Social Connections: Socially Integrated (03/14/2021)   Social Connection and Isolation Panel [NHANES]    Frequency of Communication with Friends and Family: More than three times a week    Frequency of Social Gatherings with Friends and Family: More than three times a week    Attends Religious Services: More than 4 times per year    Active Member of [employer] or Organizations: Yes    Attends Engineer, structural: More than 4 times per year    Marital Status: Married  Catering manager Violence: Not At Risk (03/14/2021)   Humiliation, Afraid, Rape, and Kick questionnaire    Fear of Current or Ex-Partner: No    Emotionally Abused: No    Physically Abused: No    Sexually Abused: No    Family History  Problem Relation Age of Onset   Allergic rhinitis Sister    Stroke  Mother    Colon polyps Mother    Colon cancer Mother    Urticaria Mother    Allergic rhinitis Mother    Heart disease Father    Diabetes Father    Kidney disease Father    Diabetes Maternal Grandmother    Clotting disorder Maternal Grandmother  stroke   Crohn's disease Maternal Grandmother    Diabetes Paternal Grandmother    Aneurysm Sister        brain    Current Outpatient Medications on File Prior to Visit  Medication Sig Dispense Refill   acetaminophen (TYLENOL) 500 MG tablet Take 500 mg by mouth every 6 (six) hours as needed for moderate pain.     albuterol (PROVENTIL) (2.5 MG/3ML) 0.083% nebulizer solution Take 3 mLs (2.5 mg total) by nebulization every 4 (four) hours as needed for wheezing or shortness of breath. 180 mL 1   albuterol (VENTOLIN HFA) 108 (90 Base) MCG/ACT inhaler INHALE 2 PUFFS INTO THE LUNGS EVERY 4 (FOUR) HOURS AS NEEDED FOR WHEEZING OR SHORTNESS OF BREATH. 18 Inhaler 0   augmented betamethasone dipropionate (DIPROLENE) 0.05 % ointment Apply topically 2 (two) times daily. Limit to no longer than 2 weeks use. Not on face. 50 g 0   benzonatate (TESSALON) 200 MG capsule Take 1 capsule (200 mg total) by mouth 2 (two) times daily as needed for cough. 20 capsule 0   budesonide (ENTOCORT EC) 3 MG 24 hr capsule as needed for rash.     Calcium Alginate POWD Take 1 tablet by mouth daily.     calcium carbonate (TUMS - DOSED IN MG ELEMENTAL CALCIUM) 500 MG chewable tablet Chew 2 tablets by mouth daily as needed for indigestion or heartburn.     celecoxib (CELEBREX) 200 MG capsule Take 200 mg by mouth daily.     cephALEXin (KEFLEX) 500 MG capsule Take 1 capsule (500 mg total) by mouth 3 (three) times daily. If needed for UTI 21 capsule 0   chlorhexidine (PERIDEX) 0.12 % solution as needed. thrash     cholestyramine (QUESTRAN) 4 g packet Take 4 g by mouth 2 (two) times daily.     clobetasol cream (TEMOVATE) 0.05 % Apply 1 application topically 2 (two) times daily as  needed (rash).     clonazePAM (KLONOPIN) 1 MG tablet TAKE 1 TABLET BY MOUTH TWICE A DAY MAY TAKE UP TO 3 TIMES DAILY IF NEEDED FOR EXTRA ANXIETY 60 tablet 0   cloNIDine (CATAPRES) 0.1 MG tablet Take 1 tablet (0.1 mg total) by mouth at bedtime. 90 tablet 3   cyanocobalamin (VITAMIN B12) 1000 MCG/ML injection INJECT 1 ML (1,000 MCG TOTAL) INTO THE MUSCLE EVERY 30 DAYS. 3 mL 4   Desoximetasone (TOPICORT) 0.25 % ointment Apply 1 application topically 2 (two) times daily. (Patient taking differently: Apply 1 application  topically 2 (two) times daily as needed (rash).) 30 g 1   diphenhydrAMINE (BENADRYL) 25 MG tablet Take 25 mg by mouth daily as needed for allergies.     EPINEPHrine 0.3 mg/0.3 mL IJ SOAJ injection Inject 0.3 mg into the muscle as needed for anaphylaxis.     fluorouracil (EFUDEX) 5 % cream Apply 1 application topically 2 (two) times daily as needed (rash).     fluticasone (FLONASE) 50 MCG/ACT nasal spray Place 2 sprays into both nostrils daily. 16 g 0   fluticasone (FLOVENT HFA) 44 MCG/ACT inhaler Inhale 2 puffs into the lungs 2 (two) times daily as needed (shortness of breath).     hydrocortisone (ANUSOL-HC) 25 MG suppository Place rectally as needed.     hyoscyamine (LEVSIN SL) 0.125 MG SL tablet Place 0.125 mg under the tongue every 6 (six) hours as needed for cramping.     labetalol (NORMODYNE) 100 MG tablet Take 0.5 tablets (50 mg total) by mouth 2 (two) times daily. 90  tablet 3   levETIRAcetam (KEPPRA) 250 MG tablet Take 250 mg by mouth See admin instructions. Take 250mg  three times a day at 6am, 12pm, 6pm.     levocetirizine (XYZAL) 5 MG tablet TAKE 1 TABLET BY MOUTH EVERY DAY AS NEEDED (Patient taking differently: Take 5 mg by mouth daily as needed for allergies.) 30 tablet 0   lidocaine (XYLOCAINE) 2 % solution 22mL swish and swallow as needed for sore throat; take with nystatin susp 200 mL 0   lipase/protease/amylase (CREON) 12000 units CPEP capsule Take 12,000 Units by mouth  daily with breakfast.     metoprolol tartrate (LOPRESSOR) 25 MG tablet 1 tablet with food Orally Twice a day for 30 day(s)     nystatin (MYCOSTATIN) 100000 UNIT/ML suspension Use as directed 5 mLs (500,000 Units total) in the mouth or throat 3 (three) times daily as needed (thrush). 60 mL 2   Olopatadine HCl (PATADAY) 0.2 % SOLN Use one drop in each eye once daily as needed. (Patient taking differently: Place 1 drop into both eyes daily as needed (itchy eyes).) 2.5 mL 5   ondansetron (ZOFRAN-ODT) 4 MG disintegrating tablet as needed for nausea/vomiting.     polyethylene glycol (MIRALAX / GLYCOLAX) packet Take 17 g by mouth at bedtime.     potassium chloride SA (KLOR-CON M) 20 MEQ tablet Take 1 tablet (20 mEq total) by mouth 2 (two) times daily. 180 tablet 2   prochlorperazine (COMPAZINE) 10 MG tablet Take 1 tablet (10 mg total) by mouth every 6 (six) hours as needed for nausea or vomiting. 60 tablet 0   sodium chloride (OCEAN) 0.65 % SOLN nasal spray Place 1 spray into both nostrils as needed for congestion.      Spacer/Aero-Holding Chambers ([place]) MISC See admin instructions.     Syringe/Needle, Disp, (SYRINGE 3CC/27GX1-1/4") 27G X 1-1/4" 3 ML MISC Inject 1 mL into the muscle every 30 (thirty) days. Vit B12 12 each 0   Vitamin D, Ergocalciferol, (DRISDOL) 50000 units CAPS capsule TAKE 1 CAPSULE (50,000 UNITS TOTAL) BY MOUTH EVERY 7 (SEVEN) DAYS. 4 capsule 11   No current facility-administered medications on file prior to visit.    Allergies  Allergen Reactions   Bee Venom Swelling   Doxycycline Hives   Erythromycin Hives   Gadavist [Gadobutrol] Hives, Shortness Of Breath and Itching   Paxlovid [Nirmatrelvir-Ritonavir] Other (See Comments)    Mouth swelling, mouth ulcers   Penicillins Hives    Did it involve swelling of the face/tongue/throat, SOB, or low BP? No Did it involve sudden or severe rash/hives, skin peeling, or any reaction on the inside of your mouth or nose?  Yes Did you need to seek medical attention at a hospital or doctor's office? Yes When did it last happen?      5+ years If all above answers are "NO", may proceed with cephalosporin use.    Sulfa Antibiotics Hives   Preparation H [Pramox-Pe-Glycerin-Petrolatum] Hives    Cannot use  Cream or Suppositories    Phenergan [Promethazine Hcl] Other (See Comments)    Causes restless leg syndrome       Physical Exam There were no vitals filed for this visit. Estimated body mass index is 26.22 kg/m as calculated from the following:   Height as of this encounter: 5\' 3"  (1.6 m).   Weight as of this encounter: 148 lb (67.1 kg).  EKG (optional): deferred due to virtual visit  GENERAL: alert, oriented, appears well and in no acute distress; visual  acuity grossly intact, full vision exam deferred due to pandemic and/or virtual encounter  HEENT: atraumatic, conjunttiva clear, no obvious abnormalities on inspection of external nose and ears  NECK: normal movements of the head and neck  LUNGS: on inspection no signs of respiratory distress, breathing rate appears normal, no obvious gross SOB, gasping or wheezing  CV: no obvious cyanosis  MS: moves all visible extremities without noticeable abnormality  PSYCH/NEURO: pleasant and cooperative, no obvious depression or anxiety, speech and thought processing grossly intact, Cognitive function grossly intact        03/15/2022   11:20 AM 03/14/2021    1:09 PM 03/22/2020   11:39 AM 03/15/2020    2:48 PM  Depression screen PHQ 2/9  Decreased Interest 0 0 0 0  Down, Depressed, Hopeless  0 0 0  PHQ - 2 Score 0 0 0 0       03/22/2020   11:44 AM 08/02/2020    6:21 PM 10/31/2020   11:46 AM 03/14/2021    1:12 PM 03/15/2022   11:20 AM  Fall Risk  Falls in the past year?    0 0  Was there an injury with Fall? 0   0 0  Fall Risk Category Calculator    0 0  Fall Risk Category (Retired)    Low   (RETIRED) Patient Fall Risk Level  Low fall risk Low fall  risk Low fall risk   Patient at Risk for Falls Due to Impaired balance/gait;Impaired mobility;Impaired vision    No Fall Risks  Fall risk Follow up     Falls evaluation completed     SUMMARY AND PLAN:  Encounter for Medicare annual wellness exam   Discussed applicable health maintenance/preventive health measures and advised and referred or ordered per patient preferences:  Health Maintenance  Topic Date Due   DTaP/Tdap/Td (1 - Tdap) Due, she reports she will get when sees Dr. Fabian Sharp next   MAMMOGRAM  11/26/2010 (ordered already, she is aware)   PAP SMEAR-Modifier  03/15/2022 (Originally 12/30/1978)   INFLUENZA VACCINE  05/27/2022 (Originally 09/26/2021)   Hepatitis C Screening  03/16/2023 (Originally 12/30/1975)   HIV Screening  03/16/2023 (Originally 12/29/1972)   Diabetic kidney evaluation - Urine ACR  03/17/2027 (Originally 12/30/1975)   Zoster Vaccines- Shingrix (1 of 2) 06/14/2027 (Originally 12/29/1976)   Diabetic kidney evaluation - eGFR measurement  05/05/2022   Medicare Annual Wellness (AWV)  03/16/2023   COLONOSCOPY (Pts 45-68yrs Insurance coverage will need to be confirmed)  12/07/2026   HPV VACCINES  Aged Out   COVID-19 Vaccine  Discontinued  She reports she does not have diabetes. On review of chart Hgba1cs have been excellent for the most part - likely due to healthy lifestyle - may have had prediabetes/diabetes in the past. She wanted to defer many of the screenings as noted above per her preference.    Education and counseling on the following was provided based on the above review of health and a plan/checklist for the patient, along with additional information discussed, was provided for the patient in the patient instructions :   -Provided counseling and plan for increased risk of falling if applicable per above screening. She is limited in ability to do balance exercises currently due to hip issues - seeing ortho. Discussed safe gentle exercises she could do to help  prevent falls and advised she hold on at all times doing them given her increase risk for consequences if falls.  -Discussed healthy lifestyle -She is doing a  great job with her diet - avoids processed foods and junk. Advised and counseled on a whole foods based healthy diet and regular exercise: A summary of a healthy diet was provided in the Patient Instructions.  -Recommended continued regular exercise  -Advise yearly dental visits at minimum and regular eye exams   Follow up: see patient instructions     Patient Instructions  I really enjoyed getting to talk with you today! I am available on Tuesdays and Thursdays for virtual visits if you have any questions or concerns, or if I can be of any further assistance.   CHECKLIST FROM ANNUAL WELLNESS VISIT:  -Follow up (please call to schedule if not scheduled after visit):   -yearly for annual wellness visit with primary care office  Here is a list of your preventive care/health maintenance measures and the plan for each if any are due: Some have been deferred per your preference. Please let us know if any questions.  Health Maintenance  Topic Date Due   DTaP/Tdap/Td (1 - Tdap) Never done, can get at pharmacy or at office   MAMMOGRAM  11/26/2010, ordered prior   PAP SMEAR-Modifier  03/15/2022 (Originally 12/30/1978)   INFLUENZA VACCINE  05/27/2022 (Originally 09/26/2021)   Hepatitis C Screening  03/16/2023 (Originally 12/30/1975)   HIV Screening  03/16/2023 (Originally 12/29/1972)   Diabetic kidney evaluation - Urine ACR  03/17/2027 (Originally 12/30/1975)   Zoster Vaccines- Shingrix (1 of 2) 06/14/2027 (Originally 12/29/1976)   Diabetic kidney evaluation - eGFR measurement  05/05/2022   Medicare Annual Wellness (AWV)  03/16/2023   COLONOSCOPY (Pts 45-71yrs Insurance coverage will need to be confirmed)  12/07/2026   HPV VACCINES  Aged Out   COVID-19 Vaccine  Discontinued    -See a dentist at least yearly  -Get your eyes checked and  then per your eye specialist's recommendations  -Other issues addressed today:  -I have included below further information regarding a healthy whole foods based diet, physical activity guidelines for adults, stress management and opportunities for social connections. You are already doing great with most of these!  -----------------------------------------------------------------------------------------------------------------------------------------------------------------------------------------------------------------------------------------------------------  NUTRITION: -eat real food: lots of colorful vegetables (half the plate) and fruits -5-7 servings of vegetables and fruits per day (fresh or steamed is best), exp. 2 servings of vegetables with lunch and dinner and 2 servings of fruit per day. Berries and greens such as kale and collards are great choices.  -consume on a regular basis: whole grains (make sure first ingredient on label contains the word "whole"), fresh fruits, fish, nuts, seeds, healthy oils (such as olive oil, avocado oil, grape seed oil) -may eat small amounts of dairy and lean meat on occasion, but avoid processed meats such as ham, bacon, lunch meat, etc. -drink water -try to avoid fast food and pre-packaged foods, processed meat -most experts advise limiting sodium to < 2300mg  per day, should limit further is any chronic conditions such as high blood pressure, heart disease, diabetes, etc. The American Heart Association advised that < 1500mg  is is ideal -try to avoid foods that contain any ingredients with names you do not recognize  -try to avoid sugar/sweets (except for the natural sugar that occurs in fresh fruit) -try to avoid sweet drinks -try to avoid white rice, white bread, pasta (unless whole grain), white or yellow potatoes  EXERCISE GUIDELINES FOR ADULTS: -if you wish to increase your physical activity, do so gradually and with the approval of your  doctor -STOP and seek medical care immediately if you have  any chest pain, chest discomfort or trouble breathing when starting or increasing exercise  -move and stretch your body, legs, feet and arms when sitting for long periods -Physical activity guidelines for optimal health in adults: -least 150 minutes per week of aerobic exercise (can talk, but not sing) once approved by your doctor, 20-30 minutes of sustained activity or two 10 minute episodes of sustained activity every day.  -resistance training at least 2 days per week if approved by your doctor -balance exercises 3+ days per week:   Stand somewhere where you have something sturdy to hold onto if you lose balance.    1) lift up on toes, start with 5x per day and work up to 20x   2) stand and lift on leg straight out to the side so that foot is a few inches of the floor, start with 5x each side and work up to 20x each side   3) stand on one foot, start with 5 seconds each side and work up to 20 seconds on each side  If you need ideas or help with getting more active:  -Silver sneakers https://tools.silversneakers.com  -Walk with a Doc: http://www.duncan-williams.com/  -try to include resistance (weight lifting/strength building) and balance exercises twice per week: or the following link for ideas: http://castillo-powell.com/  BuyDucts.dk  STRESS MANAGEMENT: -can try meditating, or just sitting quietly with deep breathing while intentionally relaxing all parts of your body for 5 minutes daily -if you need further help with stress, anxiety or depression please follow up with your primary doctor or contact the wonderful folks at [facility]: [phone]  SOCIAL CONNECTIONS: -options in [place] if you wish to engage in more social and exercise related activities:  -Silver sneakers https://tools.silversneakers.com  -Walk with a  Doc: http://www.duncan-williams.com/  -Check out the [facility] Active Adults 50+ section on the [place] [employer] (hiking clubs, book clubs, cards and games, chess, exercise classes, aquatic classes and much more) - see the website for details: https://www.[place].gov/departments/parks-recreation/active-adults50  -YouTube has lots of exercise videos for different ages and abilities as well  -[facility] (a variety of indoor and outdoor inperson activities for adults). [phone]. [address].  -Virtual Online Classes (a variety of topics): see seniorplanet.org or call [phone]  -consider volunteering at a school, hospice center, church, senior center or elsewhere           Terressa Koyanagi, DO

## 2022-03-26 ENCOUNTER — Ambulatory Visit (HOSPITAL_COMMUNITY)
Admission: RE | Admit: 2022-03-26 | Discharge: 2022-03-26 | Disposition: A | Discharge status: Home / Self Care | Payer: Medicare Other | Source: Ambulatory Visit | Attending: Physician Assistant | Admitting: Physician Assistant

## 2022-03-26 DIAGNOSIS — M87051 Idiopathic aseptic necrosis of right femur: Secondary | ICD-10-CM | POA: Diagnosis not present

## 2022-03-26 DIAGNOSIS — S73191A Other sprain of right hip, initial encounter: Secondary | ICD-10-CM | POA: Diagnosis not present

## 2022-03-26 DIAGNOSIS — M1611 Unilateral primary osteoarthritis, right hip: Secondary | ICD-10-CM | POA: Diagnosis not present

## 2022-03-26 DIAGNOSIS — M87851 Other osteonecrosis, right femur: Secondary | ICD-10-CM | POA: Diagnosis not present

## 2022-03-26 MED ORDER — GADOBENATE DIMEGLUMINE 529 MG/ML IV SOLN
15.0000 mL | Freq: Once | INTRAVENOUS | Status: AC | PRN
  Administered 2022-03-26: 15 mL via INTRAVENOUS

## 2022-03-29 DIAGNOSIS — M87051 Idiopathic aseptic necrosis of right femur: Secondary | ICD-10-CM | POA: Diagnosis not present

## 2022-03-30 DIAGNOSIS — J3089 Other allergic rhinitis: Secondary | ICD-10-CM | POA: Diagnosis not present

## 2022-03-30 DIAGNOSIS — J45998 Other asthma: Secondary | ICD-10-CM | POA: Diagnosis not present

## 2022-03-30 DIAGNOSIS — R21 Rash and other nonspecific skin eruption: Secondary | ICD-10-CM | POA: Diagnosis not present

## 2022-03-30 DIAGNOSIS — Z91018 Allergy to other foods: Secondary | ICD-10-CM | POA: Diagnosis not present

## 2022-03-30 DIAGNOSIS — J4530 Mild persistent asthma, uncomplicated: Secondary | ICD-10-CM | POA: Diagnosis not present

## 2022-04-04 ENCOUNTER — Encounter: Payer: Self-pay | Admitting: Licensed Clinical Social Worker

## 2022-04-04 ENCOUNTER — Telehealth: Payer: Self-pay | Admitting: Licensed Clinical Social Worker

## 2022-04-04 NOTE — Patient Outreach (Signed)
  Care Coordination   04/04/2022 Name: YILIA SACCA MRN: [id number] DOB: 1956/01/26   Care Coordination Outreach Attempts:  An unsuccessful telephone outreach was attempted for a scheduled appointment today.  Follow Up Plan:  Additional outreach attempts will be made to offer the patient care coordination information and services.   Encounter Outcome:  No Answer   Care Coordination Interventions:  No, not indicated    [name], MSW, [email or web address] Phone [phone] 4:34 PM

## 2022-04-19 ENCOUNTER — Ambulatory Visit (INDEPENDENT_AMBULATORY_CARE_PROVIDER_SITE_OTHER): Payer: Medicare Other

## 2022-04-19 ENCOUNTER — Encounter: Payer: Self-pay | Admitting: Orthopaedic Surgery

## 2022-04-19 ENCOUNTER — Ambulatory Visit: Payer: Medicare Other | Admitting: Orthopaedic Surgery

## 2022-04-19 DIAGNOSIS — M87051 Idiopathic aseptic necrosis of right femur: Secondary | ICD-10-CM

## 2022-04-19 DIAGNOSIS — M25551 Pain in right hip: Secondary | ICD-10-CM

## 2022-04-19 NOTE — Progress Notes (Signed)
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  She is someone who has stage IV melanoma and has been surviving this since around 2015.  At time she had to have Decadron and IV steroids and that is likely led to her developing right hip avascular process.  This was seen on recent MRI of her right hip that was done a few weeks ago.  She does not walk with assistive device.  She is experiencing daily right hip pain and has been rapidly worsening.  She still goes to [facility] for cancer treatment.  She is also a patient of Dr. Jolyn Nap.  She has a pacemaker but is not on blood thinning medication.  She is also on meloxicam due to inflammation and pain.  She is a very tough person who is dealt with a lot of diversity but has been able to come for a lot of things due to her drive and will.  On exam her right hip has stiffness with internal and external rotation with significant pain around the groin and hip with rotation.  Her left hip exam is entirely normal.  The MRI is reviewed with her of her right hip.  There is significant edema in the femoral head with evidence of impending subchondral collapse due to avascular necrosis.  This is end-stage AVN.  I went over hip replacement model and we talked in detail about the rationale about recommending a hip replacement to treat her avascular necrosis.  We talked about what to expect from an intraoperative and postoperative course and discussed the risks and benefits in significant detail.  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  We will take that into consideration postoperatively.  This is someone that we do need to work on getting on the OR schedule sometime in the next month and I want her offloading her hip when she can with a cane in her opposite hand.  I did review all of her notes within epic from [facility] and here.  All questions and concerns  were answered addressed.  Will work on getting her set up for right hip replacement.

## 2022-04-20 ENCOUNTER — Other Ambulatory Visit: Payer: Self-pay

## 2022-04-24 ENCOUNTER — Other Ambulatory Visit: Payer: Self-pay | Admitting: Physician Assistant

## 2022-04-24 DIAGNOSIS — Z01818 Encounter for other preprocedural examination: Secondary | ICD-10-CM

## 2022-04-28 ENCOUNTER — Other Ambulatory Visit: Payer: Self-pay | Admitting: Family

## 2022-04-28 ENCOUNTER — Other Ambulatory Visit: Payer: Self-pay | Admitting: Family Medicine

## 2022-04-28 NOTE — Telephone Encounter (Signed)
On call note: Pt requested RF clonazepam, will run out tomorrow. Checked PDMP and last fill was for #60 two mo ago. No suspicious activity/red flags. I sent in #6 clonaz to get her through until next week.  Signed:  Crissie Sickles, MD           04/28/2022

## 2022-05-01 ENCOUNTER — Telehealth: Payer: Self-pay

## 2022-05-01 MED ORDER — CLONAZEPAM 1 MG PO TABS: ORAL_TABLET | ORAL | 0 refills | Status: DC

## 2022-05-01 NOTE — Telephone Encounter (Signed)
Sent in refill  to [address]  default pharmacy

## 2022-05-01 NOTE — Addendum Note (Signed)
Addended byShanon Ace K on: 05/01/2022 01:02 PM   Modules accepted: Orders

## 2022-05-01 NOTE — Telephone Encounter (Signed)
Pt follow up today in regards to Clonazepam refill.   A message was sent to provider on 3/2 from Dr. Anitra Lauth.  Please advise and please see refill message from 04/28/2022.

## 2022-05-01 NOTE — Telephone Encounter (Signed)
Attempt to reach pt. Left a detail message that Rx sent in. To contact us if have any question.

## 2022-05-03 ENCOUNTER — Encounter (HOSPITAL_COMMUNITY): Payer: Self-pay

## 2022-05-03 ENCOUNTER — Encounter: Payer: Self-pay | Admitting: Radiology

## 2022-05-03 NOTE — Patient Instructions (Addendum)
DUE TO COVID-19 ONLY TWO VISITORS  (aged 65 and older)  ARE ALLOWED TO COME WITH YOU AND STAY IN THE WAITING ROOM ONLY DURING PRE OP AND PROCEDURE.   **NO VISITORS ARE ALLOWED IN THE SHORT STAY AREA OR RECOVERY ROOM!!**  IF YOU WILL BE ADMITTED INTO THE HOSPITAL YOU ARE ALLOWED ONLY FOUR SUPPORT PEOPLE DURING VISITATION HOURS ONLY (7 AM -8PM)   The support person(s) must pass our screening, gel in and out, and wear a mask at all times, including in the patient's room. Patients must also wear a mask when staff or their support person are in the room. Visitors GUEST BADGE MUST BE WORN VISIBLY  One adult visitor may remain with you overnight and MUST be in the room by 8 P.M.     Your procedure is scheduled on: 05/18/22   Report to [facility] Main Entrance    Report to admitting at 9:00 AM   Call this number if you have problems the morning of surgery [phone]   Do not eat food :After Midnight.   After Midnight you may have the following liquids until _8:30__AM/  DAY OF SURGERY  Water Black Coffee (sugar ok, NO MILK/CREAM OR CREAMERS)  Tea (sugar ok, NO MILK/CREAM OR CREAMERS) regular and decaf                             Plain Jell-O (NO RED)                                           Fruit ices (not with fruit pulp, NO RED)                                     Popsicles (NO RED)                                                                  Juice: apple, WHITE grape, WHITE cranberry Sports drinks like Gatorade (NO RED)     The day of surgery:  Drink ONE (1) G2 at 8:15 AM the morning of surgery. Drink in one sitting. Do not sip.  This drink was given to you during your hospital  pre-op appointment visit. Nothing else to drink after completing the   G2. At 8:30 AM          If you have questions, please contact your surgeon's office.       Oral Hygiene is also important to reduce your risk of infection.                                    Remember - BRUSH YOUR  TEETH THE MORNING OF SURGERY WITH YOUR REGULAR TOOTHPASTE  DENTURES WILL BE REMOVED PRIOR TO SURGERY PLEASE DO NOT APPLY "Poly grip" OR ADHESIVES!!!   Do NOT smoke after Midnight   Take these medicines the morning of surgery with A SIP OF WATER: Clonazepam if needed  Use your inhalers and bring them with you.                                                                                                                           Levetiracetam-Keppra                                                                                                                           Cholestyramine                                                                                                                            Labetalol                                                                                                                            Metoprolol  DO NOT TAKE ANY ORAL DIABETIC MEDICATIONS DAY OF YOUR SURGERY  Bring CPAP mask and tubing day of surgery.                              You may not have any metal on your body including hair pins, jewelry, and body piercing             Do not wear make-up, lotions, powders, perfumes or deodorant  Do not wear nail polish including gel and S&S, artificial/acrylic  nails, or any other type of covering on natural nails including finger and toenails. If you have artificial nails, gel coating, etc. that needs to be removed by a nail salon please have this removed prior to surgery or surgery may need to be canceled/ delayed if the surgeon/ anesthesia feels like they are unable to be safely monitored.   Do not shave  48 hours prior to surgery.     Do not bring valuables to the hospital. [place].   Contacts, glasses, or bridgework may not be worn into surgery.   Bring small overnight  bag day of surgery.   DO NOT [place]. PHARMACY WILL DISPENSE MEDICATIONS LISTED ON YOUR MEDICATION LIST TO YOU DURING YOUR ADMISSION [place]!       Special Instructions: Bring a copy of your healthcare power of attorney and living will documents the day of surgery if you haven't scanned them before.              Please read over the following fact sheets you were given: IF YOU HAVE QUESTIONS ABOUT YOUR PRE-OP INSTRUCTIONS PLEASE CALL [phone]    [facility] - Preparing for Surgery Before surgery, you can play an important role.  Because skin is not sterile, your skin needs to be as free of germs as possible.  You can reduce the number of germs on your skin by washing with CHG (chlorahexidine gluconate) soap before surgery.  CHG is an antiseptic cleaner which kills germs and bonds with the skin to continue killing germs even after washing. Please DO NOT use if you have an allergy to CHG or antibacterial soaps.  If your skin becomes reddened/irritated stop using the CHG and inform your nurse when you arrive at Short Stay. Do not shave (including legs and underarms) for at least 48 hours prior to the first CHG shower.   Please follow these instructions carefully:  1.  Shower with CHG Soap the night before surgery and the  morning of Surgery.  2.  If you choose to wash your hair, wash your hair first as usual with your  normal  shampoo.  3.  After you shampoo, rinse your hair and body thoroughly to remove the  shampoo.                            4.  Use CHG as you would any other liquid soap.  You can apply chg directly  to the skin and wash                       Gently with a scrungie or clean washcloth.  5.  Apply the CHG Soap to your body ONLY FROM THE NECK DOWN.   Do not use on face/ open                           Wound or open sores. Avoid contact with eyes, ears mouth and genitals (private parts).                       Wash face,  Genitals  (private parts) with your normal soap.             6.  Wash thoroughly, paying special  attention to the area where your surgery  will be performed.  7.  Thoroughly rinse your body with warm water from the neck down.  8.  DO NOT shower/wash with your normal soap after using and rinsing off  the CHG Soap.             9.  Pat yourself dry with a clean towel.            10.  Wear clean pajamas.            11.  Place clean sheets on your bed the night of your first shower and do not  sleep with pets. Day of Surgery : Do not apply any lotions/deodorants the morning of surgery.  Please wear clean clothes to the hospital/surgery center.  FAILURE TO FOLLOW THESE INSTRUCTIONS MAY RESULT IN THE CANCELLATION OF YOUR SURGERY   PATIENT SIGNATURE_________________________________  ________________________________________________________________________  Adam Phenix  An incentive spirometer is a tool that can help keep your lungs clear and active. This tool measures how well you are filling your lungs with each breath. Taking long deep breaths may help reverse or decrease the chance of developing breathing (pulmonary) problems (especially infection) following: A long period of time when you are unable to move or be active. BEFORE THE PROCEDURE  If the spirometer includes an indicator to show your best effort, your nurse or respiratory therapist will set it to a desired goal. If possible, sit up straight or lean slightly forward. Try not to slouch. Hold the incentive spirometer in an upright position. INSTRUCTIONS FOR USE  Sit on the edge of your bed if possible, or sit up as far as you can in bed or on a chair. Hold the incentive spirometer in an upright position. Breathe out normally. Place the mouthpiece in your mouth and seal your lips tightly around it. Breathe in slowly and as deeply as possible, raising the piston or the ball toward the top of the column. Hold your breath for 3-5 seconds  or for as long as possible. Allow the piston or ball to fall to the bottom of the column. Remove the mouthpiece from your mouth and breathe out normally. Rest for a few seconds and repeat Steps 1 through 7 at least 10 times every 1-2 hours when you are awake. Take your time and take a few normal breaths between deep breaths. The spirometer may include an indicator to show your best effort. Use the indicator as a goal to work toward during each repetition. After each set of 10 deep breaths, practice coughing to be sure your lungs are clear. If you have an incision (the cut made at the time of surgery), support your incision when coughing by placing a pillow or rolled up towels firmly against it. Once you are able to get out of bed, walk around indoors and cough well. You may stop using the incentive spirometer when instructed by your caregiver.  RISKS AND COMPLICATIONS Take your time so you do not get dizzy or light-headed. If you are in pain, you may need to take or ask for pain medication before doing incentive spirometry. It is harder to take a deep breath if you are having pain. AFTER USE Rest and breathe slowly and easily. It can be helpful to keep track of a log of your progress. Your caregiver can provide you with a simple table to help with this. If you are using the spirometer at home, follow these instructions: Ages IF:  You are having difficultly using the spirometer. You have trouble using the spirometer as often as instructed. Your pain medication is not giving enough relief while using the spirometer. You develop fever of 100.5 F (38.1 C) or higher. SEEK IMMEDIATE MEDICAL CARE IF:  You cough up bloody sputum that had not been present before. You develop fever of 102 F (38.9 C) or greater. You develop worsening pain at or near the incision site. MAKE SURE YOU:  Understand these instructions. Will watch your condition. Will get help right away if you are not doing  well or get worse. Document Released: 06/25/2006 Document Revised: 05/07/2011 Document Reviewed: 08/26/2006 [facility] Patient Information 2014 [place], [place].   ________________________________________________________________________

## 2022-05-08 ENCOUNTER — Encounter (HOSPITAL_COMMUNITY): Payer: Self-pay

## 2022-05-08 ENCOUNTER — Encounter (HOSPITAL_COMMUNITY)
Admission: RE | Admit: 2022-05-08 | Discharge: 2022-05-08 | Disposition: A | Discharge status: Home / Self Care | Payer: Medicare Other | Source: Ambulatory Visit | Attending: Orthopaedic Surgery | Admitting: Orthopaedic Surgery

## 2022-05-08 ENCOUNTER — Other Ambulatory Visit: Payer: Self-pay

## 2022-05-08 DIAGNOSIS — Z86718 Personal history of other venous thrombosis and embolism: Secondary | ICD-10-CM | POA: Diagnosis not present

## 2022-05-08 DIAGNOSIS — I7300 Raynaud's syndrome without gangrene: Secondary | ICD-10-CM | POA: Diagnosis not present

## 2022-05-08 DIAGNOSIS — Z950 Presence of cardiac pacemaker: Secondary | ICD-10-CM | POA: Diagnosis not present

## 2022-05-08 DIAGNOSIS — I4891 Unspecified atrial fibrillation: Secondary | ICD-10-CM | POA: Insufficient documentation

## 2022-05-08 DIAGNOSIS — Z01812 Encounter for preprocedural laboratory examination: Secondary | ICD-10-CM | POA: Diagnosis not present

## 2022-05-08 DIAGNOSIS — Z9049 Acquired absence of other specified parts of digestive tract: Secondary | ICD-10-CM | POA: Insufficient documentation

## 2022-05-08 DIAGNOSIS — Z85820 Personal history of malignant melanoma of skin: Secondary | ICD-10-CM | POA: Diagnosis not present

## 2022-05-08 DIAGNOSIS — E119 Type 2 diabetes mellitus without complications: Secondary | ICD-10-CM | POA: Diagnosis not present

## 2022-05-08 DIAGNOSIS — M8788 Other osteonecrosis, other site: Secondary | ICD-10-CM | POA: Diagnosis not present

## 2022-05-08 DIAGNOSIS — Z01818 Encounter for other preprocedural examination: Secondary | ICD-10-CM

## 2022-05-08 DIAGNOSIS — Z85828 Personal history of other malignant neoplasm of skin: Secondary | ICD-10-CM | POA: Diagnosis not present

## 2022-05-08 HISTORY — DX: Raynaud's syndrome without gangrene: I73.00

## 2022-05-08 LAB — COMPREHENSIVE METABOLIC PANEL
ALT: 22 U/L (ref 0–44)
AST: 28 U/L (ref 15–41)
Albumin: 4.4 g/dL (ref 3.5–5.0)
Alkaline Phosphatase: 89 U/L (ref 38–126)
Anion gap: 7 (ref 5–15)
BUN: 16 mg/dL (ref 8–23)
CO2: 25 mmol/L (ref 22–32)
Calcium: 8.9 mg/dL (ref 8.9–10.3)
Chloride: 105 mmol/L (ref 98–111)
Creatinine, Ser: 0.74 mg/dL (ref 0.44–1.00)
GFR, Estimated: >60 mL/min (ref >60)
Glucose, Bld: 96 mg/dL (ref 70–99)
Potassium: 4 mmol/L (ref 3.5–5.1)
Sodium: 137 mmol/L (ref 135–145)
Total Bilirubin: 0.6 mg/dL (ref 0.3–1.2)
Total Protein: 7.5 g/dL (ref 6.5–8.1)

## 2022-05-08 LAB — CBC
HCT: 43.3 % (ref 36.0–46.0)
Hemoglobin: 14.1 g/dL (ref 12.0–15.0)
MCH: 29.3 pg (ref 26.0–34.0)
MCHC: 32.6 g/dL (ref 30.0–36.0)
MCV: 90 fL (ref 80.0–100.0)
Platelets: 241 10*3/uL (ref 150–400)
RBC: 4.81 MIL/uL (ref 3.87–5.11)
RDW: 11.9 % (ref 11.5–15.5)
WBC: 5.2 10*3/uL (ref 4.0–10.5)
nRBC: 0 % (ref 0.0–0.2)

## 2022-05-08 LAB — HEMOGLOBIN A1C
Hgb A1c MFr Bld: 5.7 % — ABNORMAL HIGH (ref 4.8–5.6)
Mean Plasma Glucose: 117 mg/dL

## 2022-05-08 LAB — GLUCOSE, CAPILLARY: Glucose-Capillary: 106 mg/dL — ABNORMAL HIGH (ref 70–99)

## 2022-05-08 LAB — SURGICAL PCR SCREEN
MRSA, PCR: NEGATIVE
Staphylococcus aureus: NEGATIVE

## 2022-05-08 NOTE — Progress Notes (Addendum)
Anesthesia note:  Bowel prep reminder:  no  PCP - Dr. Idell Pickles Cardiologist -Dr. Rupert Stacks Other-   Chest x-ray - no EKG - 06/27/21-epic Stress Test - no ECHO - 2022 Cardiac Cath - no CABG-no Pacemaker/ICD device last checked:03/26/22   Sleep Study - no CPAP - no   CBG at PAT visit-106 Fasting Blood Sugar at home-83-94. Pt has no medications Checks Blood Sugar _twice a week____  Blood Thinner:no Blood Thinner Instructions: Aspirin Instructions: Last Dose:  Anesthesia review: Yes   reason: pacemaker  Patient denies shortness of breath, fever, cough and chest pain at PAT appointment. Pt reports no SOB with activities   Patient verbalized understanding of instructions that were given to them at the PAT appointment. Patient was also instructed that they will need to review over the PAT instructions again at home before surgery.yes

## 2022-05-09 ENCOUNTER — Encounter: Payer: Self-pay | Admitting: Internal Medicine

## 2022-05-09 NOTE — Progress Notes (Signed)
[facility]  Patient Information: Name:  [name]  DOB:  [birth year]  MRN:  [id number]    Planned Procedure:  Right total hip arthroplasty anterior approach  Surgeon:   Dr. Jean Rosenthal  Date of Procedure:  05/18/22  Cautery will be used.  Position during surgery:    Please send documentation back to:  [facility] (Fax # [phone])  Device Information:  Clinic EP Physician:  Virl Axe, MD   Device Type:  Pacemaker Manufacturer and Phone #:  Biotronik: [phone] Pacemaker Dependent?:  No. Date of Last Device Check:  06/27/2021 Normal Device Function?:  Yes.    Electrophysiologist's Recommendations:  Have magnet available. Provide continuous ECG monitoring when magnet is used or reprogramming is to be performed.  Procedure should not interfere with device function.  No device programming or magnet placement needed.  Per Device Clinic Standing Orders, Wanda Plump, RN  9:54 AM 05/09/2022

## 2022-05-10 NOTE — Progress Notes (Addendum)
Anesthesia Chart Review   Case: [name] Date/Time: 05/18/22 1115   Procedure: RIGHT TOTAL HIP ARTHROPLASTY ANTERIOR APPROACH (Right: Hip)   Anesthesia type: Choice   Pre-op diagnosis: avascular necrosis right hip   Location: [facility] ROOM 10 / [facility]   Surgeons: Mcarthur Rossetti, MD       DISCUSSION:67 y.o. never smoker with h/o CVA, atrial fibrillation, pacemaker-Biotronik implanted for dysautonomia (device orders in 05/09/2022 progress note), DM II, DVT, AVN right hip scheduled for above procedure 05/18/22 with Dr. Jean Rosenthal.   Pt seen by cardiology 05/17/2022. Per OV note, "The patient is cleared for surgery from a cardiac perspective with an estimated Low Risk of perioperative cardiac complications by the Revised Cardiac Risk Index Truman Hayward Criteria). The patient may proceed without further cardiac work up."  Anticipate pt can proceed with planned procedure barring acute status change.   VS: BP (!) 149/86   Pulse 73   Temp 36.6 C (Oral)   Resp 18   Ht 5\' 3"  (1.6 m)   Wt 68.9 kg   SpO2 99%   BMI 26.93 kg/m   PROVIDERS: Panosh, Standley Brooking, MD is PCP   Electrophysiologist: Virl Axe, MD  LABS: Labs reviewed: Acceptable for surgery. (all labs ordered are listed, but only abnormal results are displayed)  Labs Reviewed  HEMOGLOBIN A1C - Abnormal; Notable for the following components:      Result Value   Hgb A1c MFr Bld 5.7 (*)    All other components within normal limits  GLUCOSE, CAPILLARY - Abnormal; Notable for the following components:   Glucose-Capillary 106 (*)    All other components within normal limits  SURGICAL PCR SCREEN  CBC  COMPREHENSIVE METABOLIC PANEL  TYPE AND SCREEN     IMAGES:   EKG:   CV: Echo 05/31/2020 1. Left ventricular ejection fraction, by estimation, is 60 to 65%. The  left ventricle has normal function. The left ventricle has no regional  wall motion abnormalities. Left ventricular diastolic parameters are  consistent with  Grade I diastolic  dysfunction (impaired relaxation).   2. Right ventricular systolic function is normal. The right ventricular  size is normal.   3. The mitral valve is normal in structure. No evidence of mitral valve  regurgitation. No evidence of mitral stenosis.   4. The aortic valve is normal in structure. Aortic valve regurgitation is  not visualized. No aortic stenosis is present.   5. The inferior vena cava is normal in size with greater than 50%  respiratory variability, suggesting right atrial pressure of 3 mmHg.  Past Medical History:  Diagnosis Date   Anemia    Angio-edema    Anxiety    Arthritis    Asthma    Atrial fibrillation (HCC)    Atrial tachycardia    Autonomic dysfunction    Complication of anesthesia    pt states wakes up with "shakes"   CVA (cerebral infarction)    2012 with dizziness and vision change felt embolic from atrial tachy   Disorder of bone and cartilage, unspecified    Diverticulosis    DM (diabetes mellitus) ([place])    DVT (deep venous thrombosis) (HCC)    Eczema    Family history of anesthesia complication    PONV and " shaking "   Fatty liver    GERD (gastroesophageal reflux disease)    History of hiatal hernia    History of radiation therapy 10/24/2012   brain   HLD (hyperlipidemia)    HTN (hypertension)  Hypoglycemia, unspecified    Liver metastasis    Lung metastasis    Metastasis to brain (HCC)    Osteopenia    Other nonspecific abnormal serum enzyme levels    Other specified congenital anomalies of nervous system    Pacemaker    autonomic dysfunction   Pancreatitis    from therapy   Peripheral vascular disease (HCC)    POTS (postural orthostatic tachycardia syndrome)    Raynaud's disease    due to chemo   Skin cancer    Hx: of lung lesion    Past Surgical History:  Procedure Laterality Date   ABLATION SAPHENOUS VEIN W/ RFA     2002 x3   CARPAL TUNNEL RELEASE Left 2000   CHOLECYSTECTOMY N/A 03/28/2018    Procedure: LAPAROSCOPIC CHOLECYSTECTOMY WITH INTRAOPERATIVE CHOLANGIOGRAM;  Surgeon: Johnathan Hausen, MD;  Location: [place];  Service: General;  Laterality: N/A;   CRANIOTOMY N/A 09/30/2012   suboccipital craniectomy   CRANIOTOMY Left 02/09/2013   Procedure: LEFT PARIETAL CRANIOTOMY with stealth;  Surgeon: [name], MD;  Location: [facility];  Service: Neurosurgery;  Laterality: Left;  LEFT Parietal Craniotomy for tumor with stealth   DEEP AXILLARY SENTINEL NODE BIOPSY / EXCISION     due to extensive Melanoma-right arm   fatty tumor removed  2000   from chest   INSERT / REPLACE / REMOVE PACEMAKER  2012   1999, x 3   LAPAROSCOPY  09/06/2011   Procedure: LAPAROSCOPY OPERATIVE;  Surgeon: Claiborne Billings A. Pamala Hurry, MD;  Location: [facility];  Service: Gynecology;  Laterality: Left;  with Left Ovarian Cystectomy    MELANOMA EXCISION     with removal of lymph nodes, left shoulder   NOSE SURGERY  2010, 2012   for nose bleeds x 2   SINOSCOPY     TONSILLECTOMY AND ADENOIDECTOMY      MEDICATIONS:  acetaminophen (TYLENOL) 500 MG tablet   albuterol (PROVENTIL) (2.5 MG/3ML) 0.083% nebulizer solution   albuterol (VENTOLIN HFA) 108 (90 Base) MCG/ACT inhaler   Ascorbic Acid (VITAMIN C) 1000 MG tablet   augmented betamethasone dipropionate (DIPROLENE) 0.05 % ointment   benzonatate (TESSALON) 200 MG capsule   budesonide (ENTOCORT EC) 3 MG 24 hr capsule   calcium carbonate (TUMS EX) 750 MG chewable tablet   cephALEXin (KEFLEX) 500 MG capsule   clobetasol cream (TEMOVATE) 0.05 %   clonazePAM (KLONOPIN) 1 MG tablet   cloNIDine (CATAPRES) 0.1 MG tablet   cyanocobalamin (VITAMIN B12) 1000 MCG/ML injection   Desoximetasone (TOPICORT) 0.25 % ointment   diphenhydrAMINE (BENADRYL) 25 MG tablet   EPINEPHrine 0.3 mg/0.3 mL IJ SOAJ injection   fexofenadine (ALLEGRA) 180 MG tablet   fluticasone (FLONASE) 50 MCG/ACT nasal spray   hydrocortisone (ANUSOL-HC) 25 MG suppository   ibuprofen (ADVIL) 200 MG tablet    labetalol (NORMODYNE) 100 MG tablet   levETIRAcetam (KEPPRA) 250 MG tablet   levocetirizine (XYZAL) 5 MG tablet   lidocaine (XYLOCAINE) 2 % solution   meloxicam (MOBIC) 15 MG tablet   metoprolol tartrate (LOPRESSOR) 25 MG tablet   nystatin (MYCOSTATIN) 100000 UNIT/ML suspension   Olopatadine HCl (PATADAY) 0.2 % SOLN   ondansetron (ZOFRAN-ODT) 4 MG disintegrating tablet   polyethylene glycol (MIRALAX / GLYCOLAX) packet   potassium chloride SA (KLOR-CON M) 20 MEQ tablet   Probiotic Product (PROBIOTIC PO)   prochlorperazine (COMPAZINE) 10 MG tablet   sodium chloride (OCEAN) 0.65 % SOLN nasal spray   Spacer/Aero-Holding Chambers (OPTICHAMBER DIAMOND) MISC   Syringe/Needle, Disp, (SYRINGE 3CC/27GX1-1/4")  27G X 1-1/4" 3 ML MISC   Vitamin D, Ergocalciferol, (DRISDOL) 50000 units CAPS capsule   Zinc 50 MG TABS   No current facility-administered medications for this encounter.   Konrad Felix Ward, PA-C [facility] [phone]

## 2022-05-16 ENCOUNTER — Telehealth: Payer: Self-pay | Admitting: Internal Medicine

## 2022-05-16 NOTE — Telephone Encounter (Signed)
   Name: [name]  DOB: January 15, 1956  MRN: [id number]  Primary Cardiologist: None   Preoperative team, please contact this patient and set up a phone call appointment for further preoperative risk assessment. Please obtain consent and complete medication review. Thank you for your help.    Mayra Reel, NP 05/16/2022, 5:07 PM [place]

## 2022-05-16 NOTE — Telephone Encounter (Signed)
Spoke with patient and she states that she has an in office appointment scheduled for tomorrow, 05/17/22 at 9:00 AM to see Oda Kilts, PA-C. Will add preop clearance to appointment notes.

## 2022-05-16 NOTE — Telephone Encounter (Signed)
   Pre-operative Risk Assessment    Patient Name: [name]  DOB: [birth year] MRN: [id number]      Request for Surgical Clearance    Procedure:   Hip replacement  Date of Surgery:  Clearance 05/17/21                                 Surgeon:  Dr Rush Farmer  Surgeon's Group or Practice Name:  [facility] Phone number:  [phone] Fax number:  [phone]   Type of Clearance Requested:   - Medical    Type of Anesthesia:  Spinal   Additional requests/questions:      SignedMilbert Coulter   05/16/2022, 1:40 PM

## 2022-05-16 NOTE — Progress Notes (Unsigned)
  Electrophysiology Office Note:   Date:  05/17/2022  ID:  [name], DOB 1955-12-12, MRN [id number]  Primary Cardiologist: None Electrophysiologist: Virl Axe, MD   History of Present Illness:   [name] is a 67 y.o. female for dysautonomia, bradycardia/SND s/p PPM, Raynauds, AVN of the hip, h/o CVA, Atrial tachycardia, routine electrophysiology followup. Since last being seen in our clinic the patient reports doing well overall from a cardiac perspective.  she denies chest pain, palpitations, dyspnea, PND, orthopnea, nausea, vomiting, dizziness, syncope, edema, weight gain, or early satiety.   Review of systems complete and found to be negative unless listed in HPI.   Device History: Biotronik Dual Chamber PPM implanted 1999, gen change 2012 for Symptomatic bradycardia  Studies Reviewed:    PPM Interrogation-  reviewed in detail today,  See PACEART report.  EKG is ordered today. Personal review shows A paced at 69 bpm   Risk Assessment/Calculations:                  Physical Exam:   VS:  BP 110/70   Pulse 69   Ht 5\' 3"  (1.6 m)   Wt 154 lb (69.9 kg)   SpO2 99%   BMI 27.28 kg/m    Wt Readings from Last 3 Encounters:  05/17/22 154 lb (69.9 kg)  05/08/22 152 lb (68.9 kg)  03/15/22 148 lb (67.1 kg)     GEN: Well nourished, well developed in no acute distress NECK: No JVD; No carotid bruits CARDIAC: Regular rate and rhythm, no murmurs, rubs, gallops RESPIRATORY:  Clear to auscultation without rales, wheezing or rhonchi  ABDOMEN: Soft, non-tender, non-distended EXTREMITIES:  No edema; No deformity   ASSESSMENT AND PLAN:    Symptomatic bradycardia/SND s/p Biotronik PPM  Normal PPM function See Pace Art report No changes today  Dysautonomia Raynauds Continue labetalol 50 mg BID Chronic lightheadedness with rapid standing.   Cardiac clearance for Hip Replacement No new concerning symptoms.  The patient is cleared for surgery from a cardiac  perspective with an estimated Low Risk of perioperative cardiac complications by the Revised Cardiac Risk Index [place] Criteria). The patient may proceed without further cardiac work up.   The patient has an implanted cardiac device and additional clearance has already been obtained from the device clinic.   Disposition:   Follow up with Dr. Caryl Comes in 6 months  Signed, Shirley Friar, PA-C

## 2022-05-17 ENCOUNTER — Ambulatory Visit: Payer: Medicare Other | Attending: Student | Admitting: Student

## 2022-05-17 ENCOUNTER — Telehealth: Payer: Self-pay | Admitting: *Deleted

## 2022-05-17 ENCOUNTER — Encounter: Payer: Self-pay | Admitting: Student

## 2022-05-17 VITALS — BP 110/70 | HR 69 | Ht 63.0 in | Wt 154.0 lb

## 2022-05-17 DIAGNOSIS — Z950 Presence of cardiac pacemaker: Secondary | ICD-10-CM | POA: Diagnosis not present

## 2022-05-17 DIAGNOSIS — G901 Familial dysautonomia [Riley-Day]: Secondary | ICD-10-CM

## 2022-05-17 DIAGNOSIS — I10 Essential (primary) hypertension: Secondary | ICD-10-CM

## 2022-05-17 DIAGNOSIS — I495 Sick sinus syndrome: Secondary | ICD-10-CM

## 2022-05-17 LAB — CUP PACEART INCLINIC DEVICE CHECK
Date Time Interrogation Session: 20240321113041
Implantable Lead Connection Status: 753985
Implantable Lead Connection Status: 753985
Implantable Lead Implant Date: 19990303
Implantable Lead Implant Date: 19990303
Implantable Lead Location: 753859
Implantable Lead Location: 753860
Implantable Lead Model: 5092
Implantable Pulse Generator Implant Date: 20120905
Pulse Gen Serial Number: 66195584

## 2022-05-17 NOTE — H&P (Signed)
TOTAL HIP ADMISSION H&P  Patient is admitted for right total hip arthroplasty.  Subjective:  Chief Complaint: right hip pain  HPI: [name], 67 y.o. female, has a history of pain and functional disability in the right hip(s) due to  avascular necrosis  and patient has failed non-surgical conservative treatments for greater than 12 weeks to include NSAID's and/or analgesics, use of assistive devices, and activity modification.  Onset of symptoms was abrupt starting just several months ago with rapidlly worsening course since that time.The patient noted no past surgery on the right hip(s).  Patient currently rates pain in the right hip at 10 out of 10 with activity. Patient has night pain, worsening of pain with activity and weight bearing, trendelenberg gait, pain that interfers with activities of daily living, and pain with passive range of motion. Patient has evidence of subchondral cysts and impending femoral head collapse  by imaging studies. This condition presents safety issues increasing the risk of falls. This patient has had avascular necrosis of the hip, acetabular fracture, hip dysplasia.  There is no current active infection.  Patient Active Problem List   Diagnosis Date Noted   Avascular necrosis of bone of right hip ([place]) 04/19/2022   Moderate persistent asthma 12/08/2018   Perennial allergic rhinitis with predominantly nonallergic component 12/08/2018   Allergic conjunctivitis 12/08/2018   History of epistaxis 12/08/2018   Atopic dermatitis 12/08/2018   Recurrent urticaria 12/08/2018   Skeeter syndrome 12/08/2018   Skin lesion of chest wall 07/22/2018   S/P laparoscopic cholecystectomy 03/28/2018   Dehydration 05/18/2016   Type 2 diabetes mellitus without complication, without long-term current use of insulin ([place]) 11/01/2015   History of pacemaker    POTS (postural orthostatic tachycardia syndrome)    Iron deficiency anemia 11/11/2014   Chronic colitis 08/28/2013    Malignant melanoma, metastatic ([place]) 02/09/2013   Malignant melanoma ([place]) 12/01/2012   Brain metastasis 10/16/2012   Hypertension 09/30/2012   Anxiety state, unspecified 09/30/2012   Hypoxemia 09/30/2012   Disorder of brain 09/08/2012   Dysautonomia ([place]) 02/25/2011   Anticoagulant long-term use 01/19/2011   Neuritis 01/19/2011   Skin change 01/19/2011   CVA (cerebral infarction)    Palpitation 06/28/2010   Pacemaker-Biotronik 06/28/2010   Dizziness and giddiness 04/27/2010   DIVERTICULOSIS, COLON, HX OF 01/19/2009   CAROTID SINUS SYNDROME 11/17/2008   OTHER MALAISE AND FATIGUE 11/17/2008   HYPERTENSION, BENIGN 11/02/2008   ATRIAL TACHYCARDIA 09/16/2008   SYNCOPE, HX OF 08/09/2008   INJURY UNSPEC NERVE SHOULDER GIRDLE&UPPER LIMB 07/12/2008   PERSONAL HISTORY OF MALIGNANT MELANOMA OF SKIN 07/12/2008   LUQ PAIN 12/08/2007   NECK PAIN 09/26/2007   NUMBNESS, ARM 09/26/2007   CPK, ABNORMAL 09/26/2007   ARM PAIN, LEFT 06/25/2007   SWELLING OF LIMB 06/25/2007   OSTEOPENIA 06/25/2007   Past Medical History:  Diagnosis Date   Anemia    Angio-edema    Anxiety    Arthritis    Asthma    Atrial fibrillation (HCC)    Atrial tachycardia    Autonomic dysfunction    Complication of anesthesia    pt states wakes up with "shakes"   CVA (cerebral infarction)    2012 with dizziness and vision change felt embolic from atrial tachy   Disorder of bone and cartilage, unspecified    Diverticulosis    DM (diabetes mellitus) ([place])    DVT (deep venous thrombosis) (HCC)    Eczema    Family history of anesthesia complication    PONV  and " shaking "   Fatty liver    GERD (gastroesophageal reflux disease)    History of hiatal hernia    History of radiation therapy 10/24/2012   brain   HLD (hyperlipidemia)    HTN (hypertension)    Hypoglycemia, unspecified    Liver metastasis    Lung metastasis    Metastasis to brain (HCC)    Osteopenia    Other nonspecific abnormal serum enzyme levels     Other specified congenital anomalies of nervous system    Pacemaker    autonomic dysfunction   Pancreatitis    from therapy   Peripheral vascular disease (HCC)    POTS (postural orthostatic tachycardia syndrome)    Raynaud's disease    due to chemo   Skin cancer    Hx: of lung lesion    Past Surgical History:  Procedure Laterality Date   ABLATION SAPHENOUS VEIN W/ RFA     2002 x3   CARPAL TUNNEL RELEASE Left 2000   CHOLECYSTECTOMY N/A 03/28/2018   Procedure: LAPAROSCOPIC CHOLECYSTECTOMY WITH INTRAOPERATIVE CHOLANGIOGRAM;  Surgeon: Johnathan Hausen, MD;  Location: [facility];  Service: General;  Laterality: N/A;   CRANIOTOMY N/A 09/30/2012   suboccipital craniectomy   CRANIOTOMY Left 02/09/2013   Procedure: LEFT PARIETAL CRANIOTOMY with stealth;  Surgeon: [name], MD;  Location: [facility];  Service: Neurosurgery;  Laterality: Left;  LEFT Parietal Craniotomy for tumor with stealth   DEEP AXILLARY SENTINEL NODE BIOPSY / EXCISION     due to extensive Melanoma-right arm   fatty tumor removed  2000   from chest   INSERT / REPLACE / REMOVE PACEMAKER  2012   1999, x 3   LAPAROSCOPY  09/06/2011   Procedure: LAPAROSCOPY OPERATIVE;  Surgeon: Claiborne Billings A. Pamala Hurry, MD;  Location: [facility];  Service: Gynecology;  Laterality: Left;  with Left Ovarian Cystectomy    MELANOMA EXCISION     with removal of lymph nodes, left shoulder   NOSE SURGERY  2010, 2012   for nose bleeds x 2   SINOSCOPY     TONSILLECTOMY AND ADENOIDECTOMY      No current facility-administered medications for this encounter.   Current Outpatient Medications  Medication Sig Dispense Refill Last Dose   acetaminophen (TYLENOL) 500 MG tablet Take 500 mg by mouth See admin instructions. Take 500 mg at bedtime, may take an additional 500 mg dose during the day as needed for pain      albuterol (PROVENTIL) (2.5 MG/3ML) 0.083% nebulizer solution Take 3 mLs (2.5 mg total) by nebulization every 4 (four) hours as needed for  wheezing or shortness of breath. 180 mL 1    albuterol (VENTOLIN HFA) 108 (90 Base) MCG/ACT inhaler INHALE 2 PUFFS INTO THE LUNGS EVERY 4 (FOUR) HOURS AS NEEDED FOR WHEEZING OR SHORTNESS OF BREATH. 18 Inhaler 0    Ascorbic Acid (VITAMIN C) 1000 MG tablet Take 2,000 mg by mouth daily.      augmented betamethasone dipropionate (DIPROLENE) 0.05 % ointment Apply topically 2 (two) times daily. Limit to no longer than 2 weeks use. Not on face. (Patient taking differently: Apply 1 Application topically 2 (two) times daily as needed (hives). Limit to no longer than 2 weeks use. Not on face.) 50 g 0    benzonatate (TESSALON) 200 MG capsule Take 1 capsule (200 mg total) by mouth 2 (two) times daily as needed for cough. 20 capsule 0    budesonide (ENTOCORT EC) 3 MG 24 hr capsule Take 3 mg  by mouth as needed for rash.      calcium carbonate (TUMS EX) 750 MG chewable tablet Chew 1 tablet by mouth daily.      clobetasol cream (TEMOVATE) [id number]-123456789 % Apply 1 application topically 2 (two) times daily as needed (rash).      clonazePAM (KLONOPIN) 1 MG tablet TAKE 1 TABLET BY MOUTH TWICE A DAY MAY TAKE UP TO 3 TIMES DAILY IF NEEDED FOR EXTRA ANXIETY 70 tablet 0    cloNIDine (CATAPRES) 0.1 MG tablet Take 1 tablet (0.1 mg total) by mouth at bedtime. 90 tablet 3    cyanocobalamin (VITAMIN B12) 1000 MCG/ML injection INJECT 1 ML (1,000 MCG TOTAL) INTO THE MUSCLE EVERY 30 DAYS. 3 mL 4    Desoximetasone (TOPICORT) 0.25 % ointment Apply 1 application topically 2 (two) times daily. (Patient taking differently: Apply 1 application  topically 2 (two) times daily as needed (rash).) 30 g 1    diphenhydrAMINE (BENADRYL) 25 MG tablet Take 25-50 mg by mouth daily as needed for allergies.      EPINEPHrine 0.3 mg/0.3 mL IJ SOAJ injection Inject 0.3 mg into the muscle as needed for anaphylaxis.      fexofenadine (ALLEGRA) 180 MG tablet Take 180 mg by mouth daily as needed for allergies or rhinitis.      fluticasone (FLONASE) 50 MCG/ACT nasal  spray Place 2 sprays into both nostrils daily. (Patient taking differently: Place 2 sprays into both nostrils daily as needed for allergies.) 16 g 0    hydrocortisone (ANUSOL-HC) 25 MG suppository Place 25 mg rectally as needed for hemorrhoids.      ibuprofen (ADVIL) 200 MG tablet Take 200 mg by mouth daily as needed for moderate pain.      labetalol (NORMODYNE) 100 MG tablet Take 0.5 tablets (50 mg total) by mouth 2 (two) times daily. 90 tablet 3    levETIRAcetam (KEPPRA) 250 MG tablet Take 250 mg by mouth See admin instructions. Take 250mg  three times a day at 6am, 12pm, 6pm.      lidocaine (XYLOCAINE) 2 % solution 102mL swish and swallow as needed for sore throat; take with nystatin susp 200 mL 0    meloxicam (MOBIC) 15 MG tablet Take 15 mg by mouth daily as needed for pain.      metoprolol tartrate (LOPRESSOR) 25 MG tablet Take 25 mg by mouth daily.      nystatin (MYCOSTATIN) 100000 UNIT/ML suspension Use as directed 5 mLs (500,000 Units total) in the mouth or throat 3 (three) times daily as needed (thrush). (Patient taking differently: Use as directed 3 mLs in the mouth or throat 3 (three) times daily as needed (thrush).) 60 mL 2    Olopatadine HCl (PATADAY) 0.2 % SOLN Use one drop in each eye once daily as needed. (Patient taking differently: Place 1 drop into both eyes daily as needed (itchy eyes).) 2.5 mL 5    ondansetron (ZOFRAN-ODT) 4 MG disintegrating tablet Take 4 mg by mouth every 8 (eight) hours as needed for nausea/vomiting.      polyethylene glycol (MIRALAX / GLYCOLAX) packet Take 17 g by mouth at bedtime.      potassium chloride SA (KLOR-CON M) 20 MEQ tablet Take 1 tablet (20 mEq total) by mouth 2 (two) times daily. 180 tablet 2    Probiotic Product (PROBIOTIC PO) Take 2 capsules by mouth daily.      prochlorperazine (COMPAZINE) 10 MG tablet Take 1 tablet (10 mg total) by mouth every 6 (six) hours as needed for  nausea or vomiting. 60 tablet 0    sodium chloride (OCEAN) 0.65 % SOLN nasal  spray Place 1 spray into both nostrils 2 (two) times daily.      Vitamin D, Ergocalciferol, (DRISDOL) 50000 units CAPS capsule TAKE 1 CAPSULE (50,000 UNITS TOTAL) BY MOUTH EVERY 7 (SEVEN) DAYS. (Patient taking differently: Take 50,000 Units by mouth every Saturday.) 4 capsule 11    Zinc 50 MG TABS Take 50 mg by mouth daily.      cephALEXin (KEFLEX) 500 MG capsule Take 1 capsule (500 mg total) by mouth 3 (three) times daily. If needed for UTI 21 capsule 0 Completed Course   levocetirizine (XYZAL) 5 MG tablet TAKE 1 TABLET BY MOUTH EVERY DAY AS NEEDED 30 tablet 0 Not Taking   Spacer/Aero-Holding Chambers [facility] DIAMOND) MISC See admin instructions.      Syringe/Needle, Disp, (SYRINGE 3CC/27GX1-1/4") 27G X 1-1/4" 3 ML MISC Inject 1 mL into the muscle every 30 (thirty) days. Vit B12 12 each 0    Allergies  Allergen Reactions   Bee Venom Swelling   Doxycycline Hives   Erythromycin Hives   Gadavist [Gadobutrol] Hives, Shortness Of Breath and Itching   Paxlovid [Nirmatrelvir-Ritonavir] Other (See Comments)    Mouth swelling, mouth ulcers   Penicillins Hives    Did it involve swelling of the face/tongue/throat, SOB, or low BP? No Did it involve sudden or severe rash/hives, skin peeling, or any reaction on the inside of your mouth or nose? Yes Did you need to seek medical attention at a hospital or doctor's office? Yes When did it last happen?      5+ years If all above answers are "NO", may proceed with cephalosporin use.    Sulfa Antibiotics Hives   Preparation H [Pramox-Pe-Glycerin-Petrolatum] Hives    Cannot use  Cream or Suppositories    Phenergan [Promethazine Hcl] Other (See Comments)    Causes restless leg syndrome    Social History   Tobacco Use   Smoking status: Never   Smokeless tobacco: Never  Substance Use Topics   Alcohol use: Never    Comment: glass of wine daily    Family History  Problem Relation Age of Onset   Allergic rhinitis Sister    Stroke Mother     Colon polyps Mother    Colon cancer Mother    Urticaria Mother    Allergic rhinitis Mother    Heart disease Father    Diabetes Father    Kidney disease Father    Diabetes Maternal Grandmother    Clotting disorder Maternal Grandmother        stroke   Crohn's disease Maternal Grandmother    Diabetes Paternal Grandmother    Aneurysm Sister        brain     Review of Systems  Objective:  Physical Exam Vitals reviewed.  Constitutional:      Appearance: Normal appearance. She is normal weight.  HENT:     Head: Normocephalic and atraumatic.  Eyes:     Extraocular Movements: Extraocular movements intact.     Pupils: Pupils are equal, round, and reactive to light.  Cardiovascular:     Rate and Rhythm: Normal rate.  Pulmonary:     Effort: Pulmonary effort is normal.     Breath sounds: Normal breath sounds.  Abdominal:     Palpations: Abdomen is soft.  Musculoskeletal:     Cervical back: Normal range of motion and neck supple.     Right hip: Tenderness and bony  tenderness present. Decreased strength.  Neurological:     Mental Status: She is alert and oriented to person, place, and time.  Psychiatric:        Behavior: Behavior normal.     Vital signs in last 24 hours: Pulse Rate:  [69] 69 (03/21 0945) BP: (110)/(70) 110/70 (03/21 0945) SpO2:  [99 %] 99 % (03/21 0945) Weight:  [69.9 kg] 69.9 kg (03/21 0945)  Labs:   Estimated body mass index is 27.28 kg/m as calculated from the following:   Height as of 05/17/22: 5\' 3"  (1.6 m).   Weight as of 05/17/22: 69.9 kg.   Imaging Review Plain radiographs demonstrate severe AVN of the right hip(s). The bone quality appears to be good for age and reported activity level.      Assessment/Plan:  End avascular necrosis, right hip(s)  The patient history, physical examination, clinical judgement of the provider and imaging studies are consistent with end stage AVN of the right hip(s) and total hip arthroplasty is deemed  medically necessary. The treatment options including medical management, injection therapy, arthroscopy and arthroplasty were discussed at length. The risks and benefits of total hip arthroplasty were presented and reviewed. The risks due to aseptic loosening, infection, stiffness, dislocation/subluxation,  thromboembolic complications and other imponderables were discussed.  The patient acknowledged the explanation, agreed to proceed with the plan and consent was signed. Patient is being admitted for inpatient treatment for surgery, pain control, PT, OT, prophylactic antibiotics, VTE prophylaxis, progressive ambulation and ADL's and discharge planning.The patient is planning to be discharged home with home health services

## 2022-05-17 NOTE — Progress Notes (Signed)
Faxed to requesting surgeons office.

## 2022-05-17 NOTE — Patient Instructions (Signed)
Medication Instructions:  Your physician recommends that you continue on your current medications as directed. Please refer to the Current Medication list given to you today.  *If you need a refill on your cardiac medications before your next appointment, please call your pharmacy*  Lab Work: None If you have labs (blood work) drawn today and your tests are completely normal, you will receive your results only by: [place] (if you have MyChart) OR A paper copy in the mail If you have any lab test that is abnormal or we need to change your treatment, we will call you to review the results.  Follow-Up: At [facility], you and your health needs are our priority.  As part of our continuing mission to provide you with exceptional heart care, we have created designated Provider Care Teams.  These Care Teams include your primary Cardiologist (physician) and Advanced Practice Providers (APPs -  Physician Assistants and Nurse Practitioners) who all work together to provide you with the care you need, when you need it.   Your next appointment:   6 month(s)  Provider:   Virl Axe, MD

## 2022-05-17 NOTE — Care Plan (Signed)
[facility] RNCM call to patient to discuss her upcoming Right total hip arthroplasty with Dr. Ninfa Linden on 05/18/22. CM met and spoke with patient and provided post op care instructions several weeks ago when in office. She is a [facility] and is agreeable to case management. She has a spouse, that will be assisting after surgery. She has a RW. Anticipate HHPT will be needed after a short hospital stay. Referral made to [facility] after choice provided. Reviewed detailed post op care instructions. Will continue to follow.

## 2022-05-17 NOTE — Anesthesia Preprocedure Evaluation (Addendum)
Anesthesia Evaluation  Patient identified by MRN, date of birth, ID band Patient awake    Reviewed: Allergy & Precautions, H&P , NPO status , Patient's Chart, lab work & pertinent test results  Airway Mallampati: II   Neck ROM: full    Dental   Pulmonary asthma    breath sounds clear to auscultation       Cardiovascular hypertension, + Peripheral Vascular Disease  + dysrhythmias Atrial Fibrillation + pacemaker  Rhythm:regular Rate:Normal     Neuro/Psych   Anxiety      Neuromuscular disease    GI/Hepatic hiatal hernia,GERD  ,,  Endo/Other  diabetes, Type 2    Renal/GU      Musculoskeletal  (+) Arthritis ,    Abdominal   Peds  Hematology   Anesthesia Other Findings   Reproductive/Obstetrics                             Anesthesia Physical Anesthesia Plan  ASA: 3  Anesthesia Plan: MAC and Spinal   Post-op Pain Management:    Induction: Intravenous  PONV Risk Score and Plan: 2 and Ondansetron, Propofol infusion, Treatment may vary due to age or medical condition and Midazolam  Airway Management Planned: Simple Face Mask  Additional Equipment:   Intra-op Plan:   Post-operative Plan:   Informed Consent: I have reviewed the patients History and Physical, chart, labs and discussed the procedure including the risks, benefits and alternatives for the proposed anesthesia with the patient or authorized representative who has indicated his/her understanding and acceptance.     Dental advisory given  Plan Discussed with: CRNA, Anesthesiologist and Surgeon  Anesthesia Plan Comments: (See PAT note 05/08/2022)       Anesthesia Quick Evaluation

## 2022-05-17 NOTE — Telephone Encounter (Signed)
Ortho bundle Pre-op call completed. 

## 2022-05-18 ENCOUNTER — Encounter (HOSPITAL_COMMUNITY): Payer: Self-pay | Admitting: Orthopaedic Surgery

## 2022-05-18 ENCOUNTER — Other Ambulatory Visit: Payer: Self-pay

## 2022-05-18 ENCOUNTER — Ambulatory Visit (HOSPITAL_BASED_OUTPATIENT_CLINIC_OR_DEPARTMENT_OTHER): Payer: Medicare Other | Admitting: Certified Registered Nurse Anesthetist

## 2022-05-18 ENCOUNTER — Observation Stay (HOSPITAL_COMMUNITY): Payer: Medicare Other

## 2022-05-18 ENCOUNTER — Observation Stay (HOSPITAL_COMMUNITY)
Admission: RE | Admit: 2022-05-18 | Discharge: 2022-05-19 | Disposition: A | Discharge status: Home Health | Payer: Medicare Other | Attending: Orthopaedic Surgery | Admitting: Orthopaedic Surgery

## 2022-05-18 ENCOUNTER — Ambulatory Visit (HOSPITAL_COMMUNITY): Payer: Medicare Other

## 2022-05-18 ENCOUNTER — Encounter (HOSPITAL_COMMUNITY)
Admission: RE | Disposition: A | Discharge status: Home Health | Payer: Self-pay | Source: Home / Self Care | Attending: Orthopaedic Surgery

## 2022-05-18 ENCOUNTER — Ambulatory Visit (HOSPITAL_COMMUNITY): Payer: Medicare Other | Admitting: Physician Assistant

## 2022-05-18 DIAGNOSIS — E119 Type 2 diabetes mellitus without complications: Secondary | ICD-10-CM | POA: Diagnosis not present

## 2022-05-18 DIAGNOSIS — Z8673 Personal history of transient ischemic attack (TIA), and cerebral infarction without residual deficits: Secondary | ICD-10-CM | POA: Diagnosis not present

## 2022-05-18 DIAGNOSIS — Z96641 Presence of right artificial hip joint: Secondary | ICD-10-CM | POA: Diagnosis not present

## 2022-05-18 DIAGNOSIS — I4891 Unspecified atrial fibrillation: Secondary | ICD-10-CM | POA: Diagnosis not present

## 2022-05-18 DIAGNOSIS — M87051 Idiopathic aseptic necrosis of right femur: Secondary | ICD-10-CM | POA: Diagnosis present

## 2022-05-18 DIAGNOSIS — I10 Essential (primary) hypertension: Secondary | ICD-10-CM | POA: Diagnosis not present

## 2022-05-18 DIAGNOSIS — Z85828 Personal history of other malignant neoplasm of skin: Secondary | ICD-10-CM | POA: Diagnosis not present

## 2022-05-18 DIAGNOSIS — Z950 Presence of cardiac pacemaker: Secondary | ICD-10-CM | POA: Insufficient documentation

## 2022-05-18 DIAGNOSIS — Z86718 Personal history of other venous thrombosis and embolism: Secondary | ICD-10-CM | POA: Insufficient documentation

## 2022-05-18 DIAGNOSIS — E1151 Type 2 diabetes mellitus with diabetic peripheral angiopathy without gangrene: Secondary | ICD-10-CM | POA: Diagnosis not present

## 2022-05-18 DIAGNOSIS — Z79899 Other long term (current) drug therapy: Secondary | ICD-10-CM | POA: Diagnosis not present

## 2022-05-18 DIAGNOSIS — M87851 Other osteonecrosis, right femur: Secondary | ICD-10-CM | POA: Diagnosis not present

## 2022-05-18 DIAGNOSIS — Z471 Aftercare following joint replacement surgery: Secondary | ICD-10-CM | POA: Diagnosis not present

## 2022-05-18 DIAGNOSIS — J45909 Unspecified asthma, uncomplicated: Secondary | ICD-10-CM | POA: Diagnosis not present

## 2022-05-18 DIAGNOSIS — M879 Osteonecrosis, unspecified: Secondary | ICD-10-CM

## 2022-05-18 DIAGNOSIS — I739 Peripheral vascular disease, unspecified: Secondary | ICD-10-CM | POA: Diagnosis not present

## 2022-05-18 DIAGNOSIS — Z01818 Encounter for other preprocedural examination: Secondary | ICD-10-CM

## 2022-05-18 HISTORY — PX: TOTAL HIP ARTHROPLASTY: SHX124

## 2022-05-18 LAB — GLUCOSE, CAPILLARY: Glucose-Capillary: 105 mg/dL — ABNORMAL HIGH (ref 70–99)

## 2022-05-18 LAB — TYPE AND SCREEN
ABO/RH(D): O POS
Antibody Screen: NEGATIVE

## 2022-05-18 SURGERY — ARTHROPLASTY, HIP, TOTAL, ANTERIOR APPROACH
Anesthesia: Monitor Anesthesia Care | Site: Hip | Laterality: Right

## 2022-05-18 MED ORDER — FENTANYL CITRATE PF 50 MCG/ML IJ SOSY: 25.0000 ug | PREFILLED_SYRINGE | INTRAMUSCULAR | Status: DC | PRN

## 2022-05-18 MED ORDER — VANCOMYCIN HCL IN DEXTROSE 1-5 GM/200ML-% IV SOLN
1000.0000 mg | Freq: Two times a day (BID) | INTRAVENOUS | Status: AC
  Filled 2022-05-18: qty 200

## 2022-05-18 MED ORDER — MENTHOL 3 MG MT LOZG: 1.0000 | LOZENGE | OROMUCOSAL | Status: DC | PRN

## 2022-05-18 MED ORDER — CEFAZOLIN SODIUM 1 G IJ SOLR
INTRAMUSCULAR | Status: AC
  Filled 2022-05-18: qty 20

## 2022-05-18 MED ORDER — CALCIUM CARBONATE ANTACID 500 MG PO CHEW
750.0000 mg | CHEWABLE_TABLET | Freq: Every day | ORAL | Status: DC
  Administered 2022-05-18 – 2022-05-19 (×2): 750 mg via ORAL
  Filled 2022-05-18 (×2): qty 4

## 2022-05-18 MED ORDER — SODIUM CHLORIDE 0.9 % IR SOLN
Status: DC | PRN
  Administered 2022-05-18: 1000 mL

## 2022-05-18 MED ORDER — METHOCARBAMOL 500 MG PO TABS
500.0000 mg | ORAL_TABLET | Freq: Four times a day (QID) | ORAL | Status: DC | PRN
  Administered 2022-05-18 – 2022-05-19 (×3): 500 mg via ORAL
  Filled 2022-05-18 (×3): qty 1

## 2022-05-18 MED ORDER — OXYCODONE HCL 5 MG PO TABS
5.0000 mg | ORAL_TABLET | ORAL | Status: DC | PRN
  Administered 2022-05-18: 5 mg via ORAL
  Filled 2022-05-18 (×2): qty 2
  Filled 2022-05-18: qty 1

## 2022-05-18 MED ORDER — PHENOL 1.4 % MT LIQD: 1.0000 | OROMUCOSAL | Status: DC | PRN

## 2022-05-18 MED ORDER — CEFAZOLIN SODIUM-DEXTROSE 1-4 GM/50ML-% IV SOLN
1.0000 g | Freq: Once | INTRAVENOUS | Status: AC
  Administered 2022-05-18: 1 g via INTRAVENOUS
  Filled 2022-05-18: qty 50

## 2022-05-18 MED ORDER — TRANEXAMIC ACID-NACL 1000-0.7 MG/100ML-% IV SOLN
1000.0000 mg | INTRAVENOUS | Status: DC
  Filled 2022-05-18: qty 100

## 2022-05-18 MED ORDER — PROPOFOL 10 MG/ML IV BOLUS
INTRAVENOUS | Status: DC | PRN
  Administered 2022-05-18 (×2): 30 mg via INTRAVENOUS
  Administered 2022-05-18: 40 mg via INTRAVENOUS

## 2022-05-18 MED ORDER — ONDANSETRON HCL 4 MG/2ML IJ SOLN: 4.0000 mg | Freq: Four times a day (QID) | INTRAMUSCULAR | Status: DC | PRN

## 2022-05-18 MED ORDER — ONDANSETRON HCL 4 MG PO TABS: 4.0000 mg | ORAL_TABLET | Freq: Four times a day (QID) | ORAL | Status: DC | PRN

## 2022-05-18 MED ORDER — PROCHLORPERAZINE MALEATE 10 MG PO TABS: 10.0000 mg | ORAL_TABLET | Freq: Four times a day (QID) | ORAL | Status: DC | PRN

## 2022-05-18 MED ORDER — LACTATED RINGERS IV SOLN: INTRAVENOUS | Status: DC

## 2022-05-18 MED ORDER — MIDAZOLAM HCL 5 MG/5ML IJ SOLN
INTRAMUSCULAR | Status: DC | PRN
  Administered 2022-05-18: 2 mg via INTRAVENOUS

## 2022-05-18 MED ORDER — SODIUM CHLORIDE 0.9 % IV SOLN: INTRAVENOUS | Status: DC

## 2022-05-18 MED ORDER — ONDANSETRON HCL 4 MG/2ML IJ SOLN
INTRAMUSCULAR | Status: DC | PRN
  Administered 2022-05-18: 4 mg via INTRAVENOUS

## 2022-05-18 MED ORDER — ACETAMINOPHEN 325 MG PO TABS
325.0000 mg | ORAL_TABLET | Freq: Four times a day (QID) | ORAL | Status: DC | PRN
  Filled 2022-05-18: qty 2

## 2022-05-18 MED ORDER — PHENYLEPHRINE 80 MCG/ML (10ML) SYRINGE FOR IV PUSH (FOR BLOOD PRESSURE SUPPORT)
PREFILLED_SYRINGE | INTRAVENOUS | Status: DC | PRN
  Administered 2022-05-18: 80 ug via INTRAVENOUS

## 2022-05-18 MED ORDER — ORAL CARE MOUTH RINSE: 15.0000 mL | Freq: Once | OROMUCOSAL | Status: AC

## 2022-05-18 MED ORDER — CHLORHEXIDINE GLUCONATE 0.12 % MT SOLN
15.0000 mL | Freq: Once | OROMUCOSAL | Status: AC
  Administered 2022-05-18: 15 mL via OROMUCOSAL

## 2022-05-18 MED ORDER — CEFAZOLIN SODIUM-DEXTROSE 2-3 GM-%(50ML) IV SOLR
INTRAVENOUS | Status: DC | PRN
  Administered 2022-05-18: 2 g via INTRAVENOUS

## 2022-05-18 MED ORDER — DEXAMETHASONE SODIUM PHOSPHATE 10 MG/ML IJ SOLN
INTRAMUSCULAR | Status: DC | PRN
  Administered 2022-05-18: 5 mg via INTRAVENOUS

## 2022-05-18 MED ORDER — PHENYLEPHRINE HCL-NACL 20-0.9 MG/250ML-% IV SOLN
INTRAVENOUS | Status: DC | PRN
  Administered 2022-05-18: 30 ug/min via INTRAVENOUS

## 2022-05-18 MED ORDER — LABETALOL HCL 100 MG PO TABS
50.0000 mg | ORAL_TABLET | Freq: Two times a day (BID) | ORAL | Status: DC
  Filled 2022-05-18 (×2): qty 1

## 2022-05-18 MED ORDER — DEXAMETHASONE SODIUM PHOSPHATE 10 MG/ML IJ SOLN
INTRAMUSCULAR | Status: AC
  Filled 2022-05-18: qty 1

## 2022-05-18 MED ORDER — CLONAZEPAM 1 MG PO TABS
1.0000 mg | ORAL_TABLET | Freq: Three times a day (TID) | ORAL | Status: DC | PRN
  Administered 2022-05-19: 1 mg via ORAL
  Filled 2022-05-18: qty 1

## 2022-05-18 MED ORDER — POVIDONE-IODINE 10 % EX SWAB: 2.0000 | Freq: Once | CUTANEOUS | Status: DC

## 2022-05-18 MED ORDER — METOCLOPRAMIDE HCL 5 MG PO TABS: 5.0000 mg | ORAL_TABLET | Freq: Three times a day (TID) | ORAL | Status: DC | PRN

## 2022-05-18 MED ORDER — HYDROMORPHONE HCL 1 MG/ML IJ SOLN: 0.5000 mg | INTRAMUSCULAR | Status: DC | PRN

## 2022-05-18 MED ORDER — METHOCARBAMOL 1000 MG/10ML IJ SOLN: 500.0000 mg | Freq: Four times a day (QID) | INTRAVENOUS | Status: DC | PRN

## 2022-05-18 MED ORDER — VANCOMYCIN HCL IN DEXTROSE 1-5 GM/200ML-% IV SOLN
1000.0000 mg | INTRAVENOUS | Status: AC
  Administered 2022-05-18: 1000 mg via INTRAVENOUS
  Filled 2022-05-18: qty 200

## 2022-05-18 MED ORDER — CLONIDINE HCL 0.1 MG PO TABS
0.1000 mg | ORAL_TABLET | Freq: Every day | ORAL | Status: DC
  Administered 2022-05-18: 0.1 mg via ORAL
  Filled 2022-05-18: qty 1

## 2022-05-18 MED ORDER — POTASSIUM CHLORIDE CRYS ER 20 MEQ PO TBCR
20.0000 meq | EXTENDED_RELEASE_TABLET | Freq: Two times a day (BID) | ORAL | Status: DC
  Administered 2022-05-19: 20 meq via ORAL
  Filled 2022-05-18 (×2): qty 1

## 2022-05-18 MED ORDER — METOCLOPRAMIDE HCL 5 MG/ML IJ SOLN: 5.0000 mg | Freq: Three times a day (TID) | INTRAMUSCULAR | Status: DC | PRN

## 2022-05-18 MED ORDER — OXYCODONE HCL 5 MG PO TABS: 5.0000 mg | ORAL_TABLET | Freq: Once | ORAL | Status: DC | PRN

## 2022-05-18 MED ORDER — PHENYLEPHRINE 80 MCG/ML (10ML) SYRINGE FOR IV PUSH (FOR BLOOD PRESSURE SUPPORT)
PREFILLED_SYRINGE | INTRAVENOUS | Status: AC
  Filled 2022-05-18: qty 10

## 2022-05-18 MED ORDER — BUPIVACAINE IN DEXTROSE 0.75-8.25 % IT SOLN
INTRATHECAL | Status: DC | PRN
  Administered 2022-05-18: 1.8 mL via INTRATHECAL

## 2022-05-18 MED ORDER — LEVETIRACETAM 250 MG PO TABS
250.0000 mg | ORAL_TABLET | ORAL | Status: DC
  Administered 2022-05-18 – 2022-05-19 (×2): 250 mg via ORAL
  Filled 2022-05-18 (×3): qty 1

## 2022-05-18 MED ORDER — PANTOPRAZOLE SODIUM 40 MG PO TBEC
40.0000 mg | DELAYED_RELEASE_TABLET | Freq: Every day | ORAL | Status: DC
  Administered 2022-05-18 – 2022-05-19 (×2): 40 mg via ORAL
  Filled 2022-05-18 (×2): qty 1

## 2022-05-18 MED ORDER — OXYCODONE HCL 5 MG PO TABS
10.0000 mg | ORAL_TABLET | ORAL | Status: DC | PRN
  Administered 2022-05-18 – 2022-05-19 (×3): 10 mg via ORAL
  Filled 2022-05-18 (×2): qty 2

## 2022-05-18 MED ORDER — VITAMIN C 500 MG PO TABS
2000.0000 mg | ORAL_TABLET | Freq: Every day | ORAL | Status: DC
  Administered 2022-05-19: 2000 mg via ORAL
  Filled 2022-05-18: qty 4

## 2022-05-18 MED ORDER — PROPOFOL 500 MG/50ML IV EMUL
INTRAVENOUS | Status: DC | PRN
  Administered 2022-05-18: 100 ug/kg/min via INTRAVENOUS

## 2022-05-18 MED ORDER — ONDANSETRON HCL 4 MG/2ML IJ SOLN
INTRAMUSCULAR | Status: AC
  Filled 2022-05-18: qty 2

## 2022-05-18 MED ORDER — ALBUTEROL SULFATE HFA 108 (90 BASE) MCG/ACT IN AERS: 2.0000 | INHALATION_SPRAY | RESPIRATORY_TRACT | Status: DC | PRN

## 2022-05-18 MED ORDER — METOPROLOL TARTRATE 25 MG PO TABS
25.0000 mg | ORAL_TABLET | Freq: Every day | ORAL | Status: DC
  Administered 2022-05-19: 25 mg via ORAL
  Filled 2022-05-18: qty 1

## 2022-05-18 MED ORDER — ALUM & MAG HYDROXIDE-SIMETH 200-200-20 MG/5ML PO SUSP: 30.0000 mL | ORAL | Status: DC | PRN

## 2022-05-18 MED ORDER — 0.9 % SODIUM CHLORIDE (POUR BTL) OPTIME
TOPICAL | Status: DC | PRN
  Administered 2022-05-18: 1000 mL

## 2022-05-18 MED ORDER — ALBUTEROL SULFATE (2.5 MG/3ML) 0.083% IN NEBU: 2.5000 mg | INHALATION_SOLUTION | RESPIRATORY_TRACT | Status: DC | PRN

## 2022-05-18 MED ORDER — PROPOFOL 1000 MG/100ML IV EMUL
INTRAVENOUS | Status: AC
  Filled 2022-05-18: qty 100

## 2022-05-18 MED ORDER — OXYCODONE HCL 5 MG/5ML PO SOLN: 5.0000 mg | Freq: Once | ORAL | Status: DC | PRN

## 2022-05-18 MED ORDER — DOCUSATE SODIUM 100 MG PO CAPS
100.0000 mg | ORAL_CAPSULE | Freq: Two times a day (BID) | ORAL | Status: DC
  Administered 2022-05-19: 100 mg via ORAL
  Filled 2022-05-18 (×2): qty 1

## 2022-05-18 MED ORDER — ZINC SULFATE 220 (50 ZN) MG PO CAPS
220.0000 mg | ORAL_CAPSULE | Freq: Every day | ORAL | Status: DC
  Administered 2022-05-19: 220 mg via ORAL
  Filled 2022-05-18: qty 1

## 2022-05-18 MED ORDER — ASPIRIN 81 MG PO CHEW
81.0000 mg | CHEWABLE_TABLET | Freq: Two times a day (BID) | ORAL | Status: DC
  Administered 2022-05-18 – 2022-05-19 (×2): 81 mg via ORAL
  Filled 2022-05-18 (×2): qty 1

## 2022-05-18 MED ORDER — MIDAZOLAM HCL 2 MG/2ML IJ SOLN
INTRAMUSCULAR | Status: AC
  Filled 2022-05-18: qty 2

## 2022-05-18 SURGICAL SUPPLY — 42 items
APL SKNCLS STERI-STRIP NONHPOA (GAUZE/BANDAGES/DRESSINGS)
BAG COUNTER SPONGE SURGICOUNT (BAG) ×1 IMPLANT
BAG SPEC THK2 15X12 ZIP CLS (MISCELLANEOUS) ×1
BAG SPNG CNTER NS LX DISP (BAG) ×1
BAG ZIPLOCK 12X15 (MISCELLANEOUS) IMPLANT
BENZOIN TINCTURE PRP APPL 2/3 (GAUZE/BANDAGES/DRESSINGS) IMPLANT
BLADE SAW SGTL 18X1.27X75 (BLADE) ×1 IMPLANT
COVER PERINEAL POST (MISCELLANEOUS) ×1 IMPLANT
COVER SURGICAL LIGHT HANDLE (MISCELLANEOUS) ×1 IMPLANT
CUP SECTOR GRIPTON 50MM (Cup) IMPLANT
DRAPE FOOT SWITCH (DRAPES) ×1 IMPLANT
DRAPE STERI IOBAN 125X83 (DRAPES) ×1 IMPLANT
DRAPE U-SHAPE 47X51 STRL (DRAPES) ×2 IMPLANT
DRSG AQUACEL AG ADV 3.5X10 (GAUZE/BANDAGES/DRESSINGS) ×1 IMPLANT
DURAPREP 26ML APPLICATOR (WOUND CARE) ×1 IMPLANT
ELECT REM PT RETURN 15FT ADLT (MISCELLANEOUS) ×1 IMPLANT
GAUZE XEROFORM 1X8 LF (GAUZE/BANDAGES/DRESSINGS) ×1 IMPLANT
GLOVE BIO SURGEON STRL SZ7.5 (GLOVE) ×1 IMPLANT
GLOVE BIOGEL PI IND STRL 8 (GLOVE) ×2 IMPLANT
GLOVE ECLIPSE 8.0 STRL XLNG CF (GLOVE) ×1 IMPLANT
GOWN STRL REUS W/ TWL XL LVL3 (GOWN DISPOSABLE) ×2 IMPLANT
GOWN STRL REUS W/TWL XL LVL3 (GOWN DISPOSABLE) ×2
HANDPIECE INTERPULSE COAX TIP (DISPOSABLE) ×1
HEAD FEM STD 32X+1 STRL (Hips) IMPLANT
HOLDER FOLEY CATH W/STRAP (MISCELLANEOUS) ×1 IMPLANT
KIT TURNOVER KIT A (KITS) IMPLANT
LINER ACETABULAR 32X50 (Liner) IMPLANT
PACK ANTERIOR HIP CUSTOM (KITS) ×1 IMPLANT
SET HNDPC FAN SPRY TIP SCT (DISPOSABLE) ×1 IMPLANT
STAPLER VISISTAT 35W (STAPLE) IMPLANT
STEM FEM ACTIS STD SZ4 (Stem) IMPLANT
STRIP CLOSURE SKIN 1/2X4 (GAUZE/BANDAGES/DRESSINGS) IMPLANT
SUT ETHIBOND NAB CT1 #1 30IN (SUTURE) ×1 IMPLANT
SUT ETHILON 2 0 PS N (SUTURE) IMPLANT
SUT MNCRL AB 4-0 PS2 18 (SUTURE) IMPLANT
SUT VIC AB 0 CT1 36 (SUTURE) ×1 IMPLANT
SUT VIC AB 1 CT1 36 (SUTURE) ×1 IMPLANT
SUT VIC AB 2-0 CT1 27 (SUTURE) ×2
SUT VIC AB 2-0 CT1 TAPERPNT 27 (SUTURE) ×2 IMPLANT
TRAY FOLEY MTR SLVR 14FR STAT (SET/KITS/TRAYS/PACK) IMPLANT
TRAY FOLEY MTR SLVR 16FR STAT (SET/KITS/TRAYS/PACK) IMPLANT
YANKAUER SUCT BULB TIP NO VENT (SUCTIONS) ×1 IMPLANT

## 2022-05-18 NOTE — Progress Notes (Signed)
Report received from Ronne Binning, RN

## 2022-05-18 NOTE — Progress Notes (Signed)
Notified MDA that patient's diastolic blood pressures have been elevated at 100 or greater a couple times but she also had a couple low BP's in the [id number] diastolic.  At that time the blood pressure cuff was switched to a smaller cuff d/t the patient having small upper arms.  The subsequent blood pressures were 150/100.  After reviewing all blood pressures with the MDA no new medications were ordered to be given.

## 2022-05-18 NOTE — Evaluation (Signed)
Physical Therapy Evaluation Patient Details Name: [name] MRN: [id number] DOB: 18-Jan-1956 Today's Date: 05/18/2022  History of Present Illness  67 yo female presents to therapy s/p R THA, anterior approach on 05/18/2022 due to failure of conservative measures. Pt PMH includes but is not limited to: asthma, DM II, positional orthostatic, pacemaker, malignant melanoma, brain, liver and lung metastasis s/p radiation and L parietal craniotomy, diverticulitis, HTN, CVA, and DVT.  Clinical Impression    [name] is a 67 y.o. female POD 0 s/p R THA. Patient reports IND with mobility at baseline. Patient is now limited by functional impairments (see PT problem list below) and requires min guard for bed mobility and min guard and cues for transfers. Patient was able to ambulate 80 feet with RW and min guard level of assist. Patient instructed in exercise to facilitate ROM and circulation to manage edema. Patient will benefit from continued skilled PT interventions to address impairments and progress towards PLOF. Acute PT will follow to progress mobility and stair training in preparation for safe discharge home.      Recommendations for follow up therapy are one component of a multi-disciplinary discharge planning process, led by the attending physician.  Recommendations may be updated based on patient status, additional functional criteria and insurance authorization.  Follow Up Recommendations Home health PT      Assistance Recommended at Discharge Intermittent Supervision/Assistance  Patient can return home with the following  A little help with walking and/or transfers;A little help with bathing/dressing/bathroom;Assistance with cooking/housework;Assist for transportation;Help with stairs or ramp for entrance    Equipment Recommendations None recommended by PT (pt reports having DME)  Recommendations for Other Services       Functional Status Assessment Patient has had a recent  decline in their functional status and demonstrates the ability to make significant improvements in function in a reasonable and predictable amount of time.     Precautions / Restrictions Precautions Precautions: Fall Restrictions Weight Bearing Restrictions: No RLE Weight Bearing: Weight bearing as tolerated      Mobility  Bed Mobility Overal bed mobility: Needs Assistance Bed Mobility: Supine to Sit     Supine to sit: Min guard     General bed mobility comments: increased time, HOB elevated and use of L bed rail    Transfers Overall transfer level: Needs assistance Equipment used: Rolling walker (2 wheels) Transfers: Sit to/from Stand Sit to Stand: Min guard           General transfer comment: cues for proper UE and AD placement    Ambulation/Gait Ambulation/Gait assistance: Min guard Gait Distance (Feet): 80 Feet Assistive device: Rolling walker (2 wheels) Gait Pattern/deviations: Step-to pattern Gait velocity: decreased        Stairs            Wheelchair Mobility    Modified Rankin (Stroke Patients Only)       Balance Overall balance assessment: Needs assistance Sitting-balance support: Feet supported Sitting balance-Leahy Scale: Fair     Standing balance support: No upper extremity supported (static standing, B UE support at [facility] for gait) Standing balance-Leahy Scale: Fair                               Pertinent Vitals/Pain Pain Assessment Pain Assessment: 0-10 Pain Score: 8  Pain Location: LBP and R THA Pain Descriptors / Indicators: Aching, Discomfort, Constant, Operative site guarding, Burning Pain Intervention(s): Limited activity within  patient's tolerance, Monitored during session, Premedicated before session, Repositioned, Ice applied    Home Living Family/patient expects to be discharged to:: Private residence Living Arrangements: Spouse/significant other Available Help at Discharge: Family Type of Home:  House Home Access: Level entry     Alternate Level Stairs-Number of Steps: [place]: Two [place]: Conservation officer, nature (2 wheels);Cane - single point      Prior Function Prior Level of Function : Independent/Modified Independent             Mobility Comments: IND with all ADLs, self care tasks, IADLs, active amb 3 miles per day       Hand Dominance        Extremity/Trunk Assessment        Lower Extremity Assessment Lower Extremity Assessment: RLE deficits/detail RLE Deficits / Details: ankle DF/PF 5/5       Communication   Communication: No difficulties  Cognition Arousal/Alertness: Awake/alert Behavior During Therapy: WFL for tasks assessed/performed Overall Cognitive Status: Within Functional Limits for tasks assessed                                          General Comments General comments (skin integrity, edema, etc.): Dr. Trevor Mace nurse/case manager in room when PT arrived and provided ed to pt on possible leg length discrepancy with R > L    Exercises Total Joint Exercises Ankle Circles/Pumps: AROM, Both, 20 reps   Assessment/Plan    PT Assessment Patient needs continued PT services  PT Problem List Decreased strength;Decreased range of motion;Decreased activity tolerance;Decreased balance;Decreased mobility;Decreased coordination;Decreased knowledge of use of DME;Pain       PT Treatment Interventions DME instruction;Gait training;Stair training;Functional mobility training;Therapeutic activities;Therapeutic exercise;Balance training;Neuromuscular re-education;Patient/family education;Modalities    PT Goals (Current goals can be found in the Care Plan section)  Acute Rehab PT Goals Patient Stated Goal: squatting and being able to get back up, walking 3 miles with no pain PT Goal Formulation: With patient Time For Goal Achievement: 06/01/22 Potential to Achieve Goals: Good    Frequency 7X/week      Co-evaluation               AM-PAC PT "6 Clicks" Mobility  Outcome Measure Help needed turning from your back to your side while in a flat bed without using bedrails?: A Little Help needed moving from lying on your back to sitting on the side of a flat bed without using bedrails?: A Little Help needed moving to and from a bed to a chair (including a wheelchair)?: A Little Help needed standing up from a chair using your arms (e.g., wheelchair or bedside chair)?: A Little Help needed to walk in hospital room?: A Little Help needed climbing 3-5 steps with a railing? : Total 6 Click Score: 16    End of Session Equipment Utilized During Treatment: Gait belt Activity Tolerance: Patient tolerated treatment well;No increased pain (pain medication provided at start of PT eval) Patient left: in chair;with call bell/phone within reach;with family/visitor present   PT Visit Diagnosis: Unsteadiness on feet (R26.81);Other abnormalities of gait and mobility (R26.89);Muscle weakness (generalized) (M62.81);Pain Pain - Right/Left: Right Pain - part of body: Hip;Leg    Time: 1720-1759 PT Time Calculation (min) (ACUTE ONLY): 39 min   Charges:   PT Evaluation $PT Eval Low Complexity: 1 Low PT Treatments $Gait Training: 8-22 mins $Therapeutic Activity: 8-22 mins  Baird Lyons, PT   Adair Patter 05/18/2022, 6:32 PM

## 2022-05-18 NOTE — Transfer of Care (Signed)
Immediate Anesthesia Transfer of Care Note  Patient: [name]  Procedure(s) Performed: RIGHT TOTAL HIP ARTHROPLASTY ANTERIOR APPROACH (Right: Hip)  Patient Location: PACU  Anesthesia Type:Spinal  Level of Consciousness: awake and patient cooperative  Airway & Oxygen Therapy: Patient Spontanous Breathing and Patient connected to face mask  Post-op Assessment: Report given to RN and Post -op Vital signs reviewed and stable  Post vital signs: Reviewed and stable  Last Vitals:  Vitals Value Taken Time  BP 99/57 05/18/22 1309  Temp    Pulse 65 05/18/22 1312  Resp 16 05/18/22 1312  SpO2 100 % 05/18/22 1312  Vitals shown include unvalidated device data.  Last Pain:  Vitals:   05/18/22 0942  TempSrc:   PainSc: 0-No pain         Complications: No notable events documented.

## 2022-05-18 NOTE — Interval H&P Note (Signed)
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  There has been no acute or interval change in her medical status.  See H&P.  The risks and benefits of surgery have been explained in detail and informed consent is obtained.  The right operative hip has been marked.  05/18/2022 9:54 AM  [name]  has presented today for surgery, with the diagnosis of avascular necrosis right hip.  The various methods of treatment have been discussed with the patient and family. After consideration of risks, benefits and other options for treatment, the patient has consented to  Procedure(s): RIGHT TOTAL HIP ARTHROPLASTY ANTERIOR APPROACH (Right) as a surgical intervention.  The patient's history has been reviewed, patient examined, no change in status, stable for surgery.  I have reviewed the patient's chart and labs.  Questions were answered to the patient's satisfaction.     Mcarthur Rossetti

## 2022-05-18 NOTE — Anesthesia Procedure Notes (Signed)
Procedure Name: MAC Date/Time: 05/18/2022 11:40 AM  Performed by: Claudia Desanctis, CRNAPre-anesthesia Checklist: Patient identified, Emergency Drugs available, Suction available and Patient being monitored Patient Re-evaluated:Patient Re-evaluated prior to induction Oxygen Delivery Method: Simple face mask

## 2022-05-18 NOTE — Op Note (Signed)
Operative Note  Date of operation: 05/18/2022 Preoperative diagnosis: Right hip avascular necrosis Postoperative diagnosis: Same  Procedure: Right direct anterior total hip arthroplasty  Implants: Implant Name Type Inv. Item Serial No. Manufacturer Lot No. LRB No. Used Action  CUP SECTOR GRIPTON 50MM - [id number] Cup CUP SECTOR GRIPTON 50MM  [employer] [id number] Right 1 Implanted  LINER ACETABULAR 32X50 - [id number] Liner LINER ACETABULAR 32X50  [employer]PU[employer] [id number] Right 1 Implanted  STEM FEM ACTIS STD SZ4 - [id number] Stem STEM FEM ACTIS STD SZ4  [employer] [id number] Right 1 Implanted  HEAD FEM STD 32X+1 STRL - [id number] Hips HEAD FEM STD 32X+1 STRL  [employer] [id number] Right 1 Implanted   Surgeon: Lind Guest. Ninfa Linden, MD Assistant: Benita Stabile, PA-C  Anesthesia: Spinal Antibiotics: #1 IV Ancef, #2 vancomycin IV EBL: [id number] cc Complications: None  Indications: The patient is an active 67 year old female with stage IV malignant melanoma.  She has had episodes when she was extremely sick where she needed Decadron.  She has since developed avascular necrosis of her left hip.  This was evident on a MRI showing significant AVN.  This is caused her to have left hip pain and is detrimentally affecting her mobility, her quality of life and her activities day living.  We have recommended a total hip arthroplasty given her clinical exam and MRI findings and she does wish to proceed with this as well given her significant hip pain on a daily basis especially with weightbearing on that left hip.  She understands there is risk of acute blood loss anemia, nerve or vessel injury, fracture, infection, dislocation, implant failure, leg length differences and wound healing issues.  Given her immunocompromise state these things are certainly heightened.  She understands her goals are hopefully decrease pain, improve mobility, and improve quality of life.  Procedure description:  After informed consent was obtained and the appropriate right hip was marked, the patient was brought to the operating room and set up on the stretcher where spinal anesthesia was obtained.  She was then laid in supine position on stretcher and a Foley catheter was placed.  Traction boots were placed on both her feet and she was placed supine on the Hana fracture table with a perineal placed in place in both legs and inline skeletal traction devices no traction applied.  Her right operative hip was assessed radiographically as well as the pelvis.  It was then prepped and draped with DuraPrep and sterile drapes.  A timeout is called she identified as correct patient the correct right hip.  An incision was then made just inferior and posterior to the ASIS and carried slightly bleak down the leg.  Dissection was carried down to the tensor fascia lata muscle tensor fascia was then divided longitudinally to proceed with direct interposed the hip.  Circumflex vessels were identified and cauterized.  The hip capsule identified and opened up in L-type format finding no significant joint effusion.  Cobra retractors were placed around the medial and lateral femoral neck and a femoral neck cut was made with an oscillating saw proximal to the lesser trochanter and this cut was completed with an osteotome.  A corkscrew guide was placed in the femoral head.  There is no obvious gross deformities but you could see where the avascular necrosis was causing the cartilage to eventually break off.  A bent Hohmann was placed over the medial acetabular rim and remnants of the acetabular labrum or other debris were removed.  Reaming was then  initiated of the acetabulum from a size 43 reamer and stepwise increments going up to a size 49 reamer with all reamers placed under direct visualization the last replaced in direct fluoroscopy in order to gain the depth of reaming, the inclination and the anteversion.  The real DePuy sector GRIPTION  acetabular component size 50 was then placed without difficulty.  A 32+0 neutral polythene liner was also placed.  Attention was then turned to the femur.  With the right leg externally rotated to 120 degrees, extended and adducted, a Mueller retractor was placed by the medial calcar and a Hohmann retractor was placed behind the greater trochanter.  The lateral joint capsule was released and a box cutting osteotome was used to enter the femoral canal.  Broaching was then initiated from a size 0 broach going up to a size 4 broach.  We tried to lateralize it and think it is much as possible.  A 5 broach was definitely too thick and a 3 broach was too small.  With this trial broach in place a size 4 we trialed a standard offset femoral neck and a 32+1 trial hip ball.  We reduce his Nast abdomen and we felt like we had lengthen her but we needed her stability and offset.  We dislocated the hip and remove the shock components.  We tried to lateralize her again some more and then placed the real DePuy Actis femoral component with standard offset size 4 followed by the real 32+1 metal hip ball.  This was using the acetabular as well.  We are pleased with stability and his skin assess it radiographically and clinically.  I do feel that she is slightly longer.  There is a little more offset as well.  We did go over the smallest components we could.  The soft tissue was then irrigated with normal saline solution.  The joint capsule was closed with interrupted #1 Ethibond suture followed by a #1 Vicryl to close the tensor fascia.  0 Vicryl was used to close the deep tissue and 2-0 Vicryl is used to close subcutaneous tissue.  The skin was closed with staples.  An Aquacel dressing was applied.  She was taken off on table taken recovery in stable addition.  Benita Stabile, PA-C did assist during the entire case and beginning to end and his assistance was crucial and medically necessary for soft tissue retraction and management,  helping guide implant placement and a layered closure of the wound.

## 2022-05-18 NOTE — Anesthesia Procedure Notes (Signed)
Spinal  Patient location during procedure: OR Start time: 05/18/2022 11:37 AM End time: 05/18/2022 11:41 AM Reason for block: surgical anesthesia Staffing Performed: anesthesiologist  Anesthesiologist: Albertha Ghee, MD Performed by: Albertha Ghee, MD Authorized by: Albertha Ghee, MD   Preanesthetic Checklist Completed: patient identified, IV checked, risks and benefits discussed, surgical consent, monitors and equipment checked, pre-op evaluation and timeout performed Spinal Block Patient position: sitting Prep: DuraPrep Patient monitoring: cardiac monitor, continuous pulse ox and blood pressure Approach: midline Location: L3-4 Injection technique: single-shot Needle Needle type: Pencan  Needle gauge: 24 G Needle length: 9 cm Assessment Sensory level: T10 Events: CSF return Additional Notes Functioning IV was confirmed and monitors were applied. Sterile prep and drape, including hand hygiene and sterile gloves were used. The patient was positioned and the spine was prepped. The skin was anesthetized with lidocaine.  Free flow of clear CSF was obtained prior to injecting local anesthetic into the CSF.  The spinal needle aspirated freely following injection.  The needle was carefully withdrawn.  The patient tolerated the procedure well.

## 2022-05-19 ENCOUNTER — Other Ambulatory Visit (HOSPITAL_COMMUNITY): Payer: Self-pay

## 2022-05-19 ENCOUNTER — Other Ambulatory Visit: Payer: Self-pay | Admitting: Orthopaedic Surgery

## 2022-05-19 ENCOUNTER — Encounter (HOSPITAL_COMMUNITY): Payer: Self-pay | Admitting: Orthopaedic Surgery

## 2022-05-19 DIAGNOSIS — I4891 Unspecified atrial fibrillation: Secondary | ICD-10-CM | POA: Diagnosis not present

## 2022-05-19 DIAGNOSIS — Z86718 Personal history of other venous thrombosis and embolism: Secondary | ICD-10-CM | POA: Diagnosis not present

## 2022-05-19 DIAGNOSIS — Z85828 Personal history of other malignant neoplasm of skin: Secondary | ICD-10-CM | POA: Diagnosis not present

## 2022-05-19 DIAGNOSIS — M87851 Other osteonecrosis, right femur: Secondary | ICD-10-CM | POA: Diagnosis not present

## 2022-05-19 DIAGNOSIS — E119 Type 2 diabetes mellitus without complications: Secondary | ICD-10-CM | POA: Diagnosis not present

## 2022-05-19 DIAGNOSIS — J45909 Unspecified asthma, uncomplicated: Secondary | ICD-10-CM | POA: Diagnosis not present

## 2022-05-19 DIAGNOSIS — Z79899 Other long term (current) drug therapy: Secondary | ICD-10-CM | POA: Diagnosis not present

## 2022-05-19 DIAGNOSIS — Z8673 Personal history of transient ischemic attack (TIA), and cerebral infarction without residual deficits: Secondary | ICD-10-CM | POA: Diagnosis not present

## 2022-05-19 DIAGNOSIS — Z950 Presence of cardiac pacemaker: Secondary | ICD-10-CM | POA: Diagnosis not present

## 2022-05-19 DIAGNOSIS — I10 Essential (primary) hypertension: Secondary | ICD-10-CM | POA: Diagnosis not present

## 2022-05-19 LAB — BASIC METABOLIC PANEL
Anion gap: 7 (ref 5–15)
BUN: 13 mg/dL (ref 8–23)
CO2: 24 mmol/L (ref 22–32)
Calcium: 8.6 mg/dL — ABNORMAL LOW (ref 8.9–10.3)
Chloride: 109 mmol/L (ref 98–111)
Creatinine, Ser: 0.58 mg/dL (ref 0.44–1.00)
GFR, Estimated: >60 mL/min (ref >60)
Glucose, Bld: 112 mg/dL — ABNORMAL HIGH (ref 70–99)
Potassium: 3.9 mmol/L (ref 3.5–5.1)
Sodium: 140 mmol/L (ref 135–145)

## 2022-05-19 LAB — CBC
HCT: 36.6 % (ref 36.0–46.0)
Hemoglobin: 12.1 g/dL (ref 12.0–15.0)
MCH: 30 pg (ref 26.0–34.0)
MCHC: 33.1 g/dL (ref 30.0–36.0)
MCV: 90.8 fL (ref 80.0–100.0)
Platelets: 216 10*3/uL (ref 150–400)
RBC: 4.03 MIL/uL (ref 3.87–5.11)
RDW: 12.1 % (ref 11.5–15.5)
WBC: 11.2 10*3/uL — ABNORMAL HIGH (ref 4.0–10.5)
nRBC: 0 % (ref 0.0–0.2)

## 2022-05-19 MED ORDER — METHOCARBAMOL 500 MG PO TABS
500.0000 mg | ORAL_TABLET | Freq: Four times a day (QID) | ORAL | 1 refills | Status: AC | PRN
  Filled 2022-05-19: qty 40, 10d supply, fill #0

## 2022-05-19 MED ORDER — ASPIRIN 81 MG PO CHEW
81.0000 mg | CHEWABLE_TABLET | Freq: Two times a day (BID) | ORAL | 0 refills | Status: DC
  Filled 2022-05-19: qty 35, 18d supply, fill #0

## 2022-05-19 MED ORDER — HYDROCODONE-ACETAMINOPHEN 5-325 MG PO TABS: 1.0000 | ORAL_TABLET | Freq: Four times a day (QID) | ORAL | 0 refills | Status: AC | PRN

## 2022-05-19 MED ORDER — OXYCODONE HCL 5 MG PO TABS
5.0000 mg | ORAL_TABLET | ORAL | 0 refills | Status: DC | PRN
  Filled 2022-05-19: qty 30, 5d supply, fill #0

## 2022-05-19 MED ORDER — TIZANIDINE HCL 2 MG PO TABS: 2.0000 mg | ORAL_TABLET | Freq: Four times a day (QID) | ORAL | 0 refills | Status: DC | PRN

## 2022-05-19 NOTE — Progress Notes (Signed)
Subjective: 1 Day Post-Op Procedure(s) (LRB): RIGHT TOTAL HIP ARTHROPLASTY ANTERIOR APPROACH (Right) Patient reports pain as moderate.  Poor sleep last night . Notes room was extremely hot and took until midnight to get the room cooled off. Walked 80 feet with PT yesterday. Needs to do steps as she has 16 steps to get into home.   Objective: Vital signs in last 24 hours: Temp:  [97.6 F (36.4 C)-98.8 F (37.1 C)] 97.8 F (36.6 C) (03/23 0159) Pulse Rate:  [62-81] 68 (03/23 0159) Resp:  [11-22] 17 (03/23 0159) BP: (62-154)/(39-109) 112/73 (03/23 0159) SpO2:  [97 %-100 %] 100 % (03/23 0159)  Intake/Output from previous day: 03/22 0701 - 03/23 0700 In: 1891.2 [P.O.:240; I.V.:1551.2; IV Piggyback:100] Out: 1875 [Urine:1775; Blood:100] Intake/Output this shift: Total I/O In: -  Out: 200 [Urine:200]  Recent Labs    05/19/22 0334  HGB 12.1   Recent Labs    05/19/22 0334  WBC 11.2*  RBC 4.03  HCT 36.6  PLT 216   Recent Labs    05/19/22 0334  NA 140  K 3.9  CL 109  CO2 24  BUN 13  CREATININE 0.58  GLUCOSE 112*  CALCIUM 8.6*   No results for input(s): "LABPT", "INR" in the last 72 hours.  Dorsiflexion/Plantar flexion intact Incision: dressing C/D/I Compartment soft   Assessment/Plan: 1 Day Post-Op Procedure(s) (LRB): RIGHT TOTAL HIP ARTHROPLASTY ANTERIOR APPROACH (Right) Up with therapy Plan for discharge to home if patient remains stable and does well with PT.      [name] 05/19/2022, 9:45 AM

## 2022-05-19 NOTE — Discharge Instructions (Signed)

## 2022-05-19 NOTE — Progress Notes (Signed)
Assessment unchanged. Pt verbalized understanding of dc instructions including medications, follow up care and when to call the doctor. Discharged via wc to front entrance accompanied by NT.

## 2022-05-19 NOTE — Discharge Summary (Signed)
Patient ID: [name] MRN: [id number] DOB/AGE: 1955/06/23 67 y.o.  Admit date: 05/18/2022 Discharge date: 05/19/2022  Admission Diagnoses:  Principal Problem:   Avascular necrosis of bone of right hip ([place]) Active Problems:   Status post total replacement of right hip   Discharge Diagnoses:  Same  Past Medical History:  Diagnosis Date   Anemia    Angio-edema    Anxiety    Arthritis    Asthma    Atrial fibrillation ([place])    Atrial tachycardia    Autonomic dysfunction    Complication of anesthesia    pt states wakes up with "shakes"   CVA (cerebral infarction)    2012 with dizziness and vision change felt embolic from atrial tachy   Disorder of bone and cartilage, unspecified    Diverticulosis    DM (diabetes mellitus) ([place])    DVT (deep venous thrombosis) ([facility])    Eczema    Family history of anesthesia complication    PONV and " shaking "   Fatty liver    GERD (gastroesophageal reflux disease)    History of hiatal hernia    History of radiation therapy 10/24/2012   brain   HLD (hyperlipidemia)    HTN (hypertension)    Hypoglycemia, unspecified    Liver metastasis    Lung metastasis    Metastasis to brain ([facility])    Osteopenia    Other nonspecific abnormal serum enzyme levels    Other specified congenital anomalies of nervous system    Pacemaker    autonomic dysfunction   Pancreatitis    from therapy   Peripheral vascular disease ([place])    POTS (postural orthostatic tachycardia syndrome)    Raynaud's disease    due to chemo   Skin cancer    Hx: of lung lesion    Surgeries: Procedure(s): RIGHT TOTAL HIP ARTHROPLASTY ANTERIOR APPROACH on 05/18/2022   Consultants:   Discharged Condition: Improved  Hospital Course: [name] is an 67 y.o. female who was admitted 05/18/2022 for operative treatment ofAvascular necrosis of bone of right hip ([place]). Patient has severe unremitting pain that affects sleep, daily activities, and work/hobbies. After  pre-op clearance the patient was taken to the operating room on 05/18/2022 and underwent  Procedure(s): RIGHT TOTAL HIP ARTHROPLASTY ANTERIOR APPROACH.    Patient was given perioperative antibiotics:  Anti-infectives (From admission, onward)    Start     Dose/Rate Route Frequency Ordered Stop   05/18/22 2315  ceFAZolin (ANCEF) IVPB 1 g/50 mL premix        1 g 100 mL/hr over 30 Minutes Intravenous  Once 05/18/22 2223 05/18/22 2314   05/18/22 2200  vancomycin (VANCOCIN) IVPB 1000 mg/200 mL premix        1,000 mg 200 mL/hr over 60 Minutes Intravenous Every 12 hours 05/18/22 1522 05/19/22 0959   05/18/22 0915  vancomycin (VANCOCIN) IVPB 1000 mg/200 mL premix        1,000 mg 200 mL/hr over 60 Minutes Intravenous On call to O.R. 05/18/22 0913 05/18/22 1113        Patient was given sequential compression devices, early ambulation, and chemoprophylaxis to prevent DVT.  Patient benefited maximally from hospital stay and there were no complications.    Recent vital signs: Patient Vitals for the past 24 hrs:  BP Temp Temp src Pulse Resp SpO2  05/19/22 0159 112/73 97.8 F (36.6 C) Oral 68 17 100 %  05/18/22 2232 103/77 98.8 F (37.1 C) Oral 64 17 97 %  05/18/22 1755 138/81 97.7 F (36.5 C) -- 65 18 99 %  05/18/22 1524 118/80 97.6 F (36.4 C) -- 73 16 100 %  05/18/22 1500 (!) 135/96 -- -- 72 15 100 %  05/18/22 1445 (!) 154/94 -- -- 63 14 100 %  05/18/22 1430 (!) 139/100 -- -- 69 15 100 %  05/18/22 1420 (!) 150/100 -- -- 70 11 100 %  05/18/22 1418 (!) 72/53 -- -- 81 (!) 22 100 %  05/18/22 1415 (!) 62/39 -- -- 62 20 100 %  05/18/22 1400 (!) 124/109 -- -- 62 15 100 %  05/18/22 1345 (!) 112/57 -- -- 62 12 100 %  05/18/22 1340 -- -- -- 66 16 100 %  05/18/22 1335 -- -- -- 65 17 99 %  05/18/22 1330 102/78 -- -- 62 14 100 %  05/18/22 1325 -- -- -- 62 15 100 %  05/18/22 1320 -- -- -- 66 16 100 %  05/18/22 1315 92/64 -- -- 65 17 100 %  05/18/22 1313 92/64 -- -- 66 16 100 %  05/18/22 1310 (!)  99/57 98.5 F (36.9 C) -- 69 16 100 %     Recent laboratory studies:  Recent Labs    05/19/22 0334  WBC 11.2*  HGB 12.1  HCT 36.6  PLT 216  NA 140  K 3.9  CL 109  CO2 24  BUN 13  CREATININE 0.58  GLUCOSE 112*  CALCIUM 8.6*     Discharge Medications:   Allergies as of 05/19/2022       Reactions   Bee Venom Swelling   Doxycycline Hives   Erythromycin Hives   Gadavist [gadobutrol] Hives, Shortness Of Breath, Itching   Paxlovid [nirmatrelvir-ritonavir] Swelling, Other (See Comments)   Mouth swelling, mouth ulcers   Penicillins Hives   Did it involve swelling of the face/tongue/throat, SOB, or low BP? No Did it involve sudden or severe rash/hives, skin peeling, or any reaction on the inside of your mouth or nose? Yes Did you need to seek medical attention at a hospital or doctor's office? Yes When did it last happen?      5+ years If all above answers are "NO", may proceed with cephalosporin use.   Sulfa Antibiotics Hives   Vancomycin Hives, Rash, Other (See Comments)   Develops "Red Man" Syndrome and welts/hives if it is run too quickly via IV- must be run slowly   Preparation H [pramox-pe-glycerin-petrolatum] Hives   Cannot use  Cream or Suppositories   Phenergan [promethazine Hcl] Other (See Comments)   Causes restless leg syndrome        Medication List     STOP taking these medications    ibuprofen 200 MG tablet Commonly known as: ADVIL   meloxicam 15 MG tablet Commonly known as: MOBIC       TAKE these medications    acetaminophen 500 MG tablet Commonly known as: TYLENOL Take 500 mg by mouth See admin instructions. Take 500 mg by mouth at bedtime and an additional 500 mg three times a day as needed for pain   albuterol 108 (90 Base) MCG/ACT inhaler Commonly known as: VENTOLIN HFA INHALE 2 PUFFS INTO THE LUNGS EVERY 4 (FOUR) HOURS AS NEEDED FOR WHEEZING OR SHORTNESS OF BREATH.   albuterol (2.5 MG/3ML) 0.083% nebulizer solution Commonly known  as: PROVENTIL Take 3 mLs (2.5 mg total) by nebulization every 4 (four) hours as needed for wheezing or shortness of breath.   aspirin 81 MG chewable tablet Chew  1 tablet (81 mg total) by mouth 2 (two) times daily.   augmented betamethasone dipropionate 0.05 % ointment Commonly known as: Diprolene Apply topically 2 (two) times daily. Limit to no longer than 2 weeks use. Not on face. What changed:  how much to take when to take this reasons to take this   benzonatate 200 MG capsule Commonly known as: TESSALON Take 1 capsule (200 mg total) by mouth 2 (two) times daily as needed for cough.   budesonide 3 MG 24 hr capsule Commonly known as: ENTOCORT EC Take 3 mg by mouth as needed for rash.   calcium carbonate 750 MG chewable tablet Commonly known as: TUMS EX Chew 1 tablet by mouth daily.   cephALEXin 500 MG capsule Commonly known as: Keflex Take 1 capsule (500 mg total) by mouth 3 (three) times daily. If needed for UTI   clobetasol cream 0.05 % Commonly known as: TEMOVATE Apply 1 application topically 2 (two) times daily as needed (rash).   clonazePAM 1 MG tablet Commonly known as: KLONOPIN TAKE 1 TABLET BY MOUTH TWICE A DAY MAY TAKE UP TO 3 TIMES DAILY IF NEEDED FOR EXTRA ANXIETY What changed:  how much to take how to take this when to take this additional instructions   cloNIDine 0.1 MG tablet Commonly known as: CATAPRES Take 1 tablet (0.1 mg total) by mouth at bedtime. What changed:  when to take this additional instructions   cyanocobalamin 1000 MCG/ML injection Commonly known as: VITAMIN B12 INJECT 1 ML (1,000 MCG TOTAL) INTO THE MUSCLE EVERY 30 DAYS.   Desoximetasone 0.25 % ointment Commonly known as: TOPICORT Apply 1 application topically 2 (two) times daily. What changed:  when to take this reasons to take this   diphenhydrAMINE 25 MG tablet Commonly known as: BENADRYL Take 25-50 mg by mouth daily as needed for allergies.   EPINEPHrine 0.3 mg/0.3  mL Soaj injection Commonly known as: EPI-PEN Inject 0.3 mg into the muscle as needed for anaphylaxis.   fexofenadine 180 MG tablet Commonly known as: ALLEGRA Take 180 mg by mouth daily as needed for allergies or rhinitis.   fluticasone 50 MCG/ACT nasal spray Commonly known as: FLONASE Place 2 sprays into both nostrils daily. What changed:  when to take this reasons to take this   hydrocortisone 25 MG suppository Commonly known as: ANUSOL-HC Place 25 mg rectally as needed for hemorrhoids.   labetalol 100 MG tablet Commonly known as: NORMODYNE Take 0.5 tablets (50 mg total) by mouth 2 (two) times daily.   levETIRAcetam 250 MG tablet Commonly known as: KEPPRA Take 250 mg by mouth See admin instructions. Take 250 mg by mouth at 8 AM, 2 PM, and 8 PM   levocetirizine 5 MG tablet Commonly known as: XYZAL TAKE 1 TABLET BY MOUTH EVERY DAY AS NEEDED   lidocaine 2 % solution Commonly known as: XYLOCAINE 81mL swish and swallow as needed for sore throat; take with nystatin susp   methocarbamol 500 MG tablet Commonly known as: ROBAXIN Take 1 tablet (500 mg total) by mouth every 6 (six) hours as needed for muscle spasms.   metoprolol tartrate 25 MG tablet Commonly known as: LOPRESSOR Take 25 mg by mouth See admin instructions. Take 25 mg by mouth at 8 AM and 2 PM   nystatin 100000 UNIT/ML suspension Commonly known as: MYCOSTATIN Use as directed 5 mLs (500,000 Units total) in the mouth or throat 3 (three) times daily as needed (thrush). What changed: how much to take   Olopatadine  HCl 0.2 % Soln Commonly known as: Pataday Use one drop in each eye once daily as needed. What changed:  how much to take how to take this when to take this reasons to take this additional instructions   ondansetron 4 MG disintegrating tablet Commonly known as: ZOFRAN-ODT Take 4 mg by mouth every 8 (eight) hours as needed for nausea/vomiting.   optichamber diamond Misc See admin instructions.    oxyCODONE 5 MG immediate release tablet Commonly known as: Oxy IR/ROXICODONE Take 1-2 tablets (5-10 mg total) by mouth every 4 (four) hours as needed for moderate pain (pain score 4-6).   polyethylene glycol 17 g packet Commonly known as: MIRALAX / GLYCOLAX Take 17 g by mouth at bedtime.   potassium chloride SA 20 MEQ tablet Commonly known as: KLOR-CON M Take 1 tablet (20 mEq total) by mouth 2 (two) times daily. What changed:  when to take this additional instructions   PROBIOTIC PO Take 2 capsules by mouth daily.   prochlorperazine 10 MG tablet Commonly known as: COMPAZINE Take 1 tablet (10 mg total) by mouth every 6 (six) hours as needed for nausea or vomiting.   sodium chloride 0.65 % Soln nasal spray Commonly known as: OCEAN Place 1 spray into both nostrils 2 (two) times daily.   SYRINGE 3CC/27GX1-1/4" 27G X 1-1/4" 3 ML Misc Inject 1 mL into the muscle every 30 (thirty) days. Vit B12   vitamin C 1000 MG tablet Take 2,000 mg by mouth daily.   Vitamin D (Ergocalciferol) 1.25 MG (50000 UNIT) Caps capsule Commonly known as: DRISDOL TAKE 1 CAPSULE (50,000 UNITS TOTAL) BY MOUTH EVERY 7 (SEVEN) DAYS. What changed: when to take this   Zinc 50 MG Tabs Take 50 mg by mouth daily.               Durable Medical Equipment  (From admission, onward)           Start     Ordered   05/18/22 1523  DME 3 n 1  Once        05/18/22 1522   05/18/22 1523  DME Walker rolling  Once       Question Answer Comment  Walker: With 5 Inch Wheels   Patient needs a walker to treat with the following condition Status post total replacement of right hip      05/18/22 1522            Diagnostic Studies: DG Pelvis Portable  Result Date: 05/18/2022 CLINICAL DATA:  Status post right hip arthroplasty EXAM: PORTABLE PELVIS 1-2 VIEWS COMPARISON:  Pelvis radiographs 04/19/2022 FINDINGS: The patient is status post right hip arthroplasty. Hardware alignment is within expected limits,  without evidence of complication. There is expected surrounding postoperative soft tissue gas. Left hip alignment is normal. The SI joints and symphysis pubis are intact. IMPRESSION: Status post right hip arthroplasty without evidence of complication. Electronically Signed   By: Valetta Mole M.D.   On: 05/18/2022 16:08   DG HIP UNILAT WITH PELVIS 1V RIGHT  Result Date: 05/18/2022 CLINICAL DATA:  Elective surgery. EXAM: DG HIP (WITH OR WITHOUT PELVIS) 1V RIGHT COMPARISON:  AP pelvis 04/19/2022 FINDINGS: Images were performed intraoperatively without the presence of a radiologist. Interval total right hip arthroplasty. No hardware complication is seen. Total fluoroscopy images: 3 Total fluoroscopy time: 18 seconds Total dose: Radiation Exposure Index (as provided by the fluoroscopic device): 1.87 mGy Kerma Please see intraoperative findings for further detail. IMPRESSION: Intraoperative images during total right  hip arthroplasty. Electronically Signed   By: Yvonne Kendall M.D.   On: 05/18/2022 13:36   DG C-Arm 1-60 Min-No Report  Result Date: 05/18/2022 Fluoroscopy was utilized by the requesting physician.  No radiographic interpretation.   CUP PACEART INCLINIC DEVICE CHECK  Result Date: 05/17/2022 Pacemaker check in clinic. Normal device function. Thresholds, sensing, impedances consistent with previous measurements. Chronically elevated RV threshold. <1% V pacing so has previously been turned down for sake of battery life. Device programmed to maximize longevity. No mode switch or high ventricular rates noted. Histogram distribution appropriate for patient activity level.  Estimated longevity 2y 4 mo. Patient enrolled in remote follow-up. Patient education completed.  XR Pelvis 1-2 Views  Result Date: 04/19/2022 An AP pelvis shows no acute findings.   Disposition: Discharge disposition: 06-Home-Health Care Svc          Follow-up Information     Mcarthur Rossetti, MD Follow up in 2  week(s).   Specialty: Orthopedic Surgery Contact information: [address] [place] [address] [phone]                  Signed: Erskine Emery 05/19/2022, 10:18 AM

## 2022-05-19 NOTE — Plan of Care (Signed)
Problem: Education: Goal: Knowledge of the prescribed therapeutic regimen will improve 05/19/2022 1111 by Tanda Rockers, RN Outcome: Adequate for Discharge 05/19/2022 1111 by Tanda Rockers, RN Outcome: Progressing Goal: Understanding of discharge needs will improve 05/19/2022 1111 by Tanda Rockers, RN Outcome: Adequate for Discharge 05/19/2022 1111 by Tanda Rockers, RN Outcome: Progressing Goal: Individualized Educational Video(s) 05/19/2022 1111 by Tanda Rockers, RN Outcome: Adequate for Discharge 05/19/2022 1111 by Tanda Rockers, RN Outcome: Progressing   Problem: Activity: Goal: Ability to avoid complications of mobility impairment will improve 05/19/2022 1111 by Tanda Rockers, RN Outcome: Adequate for Discharge 05/19/2022 1111 by Tanda Rockers, RN Outcome: Progressing Goal: Ability to tolerate increased activity will improve 05/19/2022 1111 by Tanda Rockers, RN Outcome: Adequate for Discharge 05/19/2022 1111 by Tanda Rockers, RN Outcome: Progressing   Problem: Clinical Measurements: Goal: Postoperative complications will be avoided or minimized 05/19/2022 1111 by Tanda Rockers, RN Outcome: Adequate for Discharge 05/19/2022 1111 by Tanda Rockers, RN Outcome: Progressing   Problem: Pain Management: Goal: Pain level will decrease with appropriate interventions 05/19/2022 1111 by Tanda Rockers, RN Outcome: Adequate for Discharge 05/19/2022 1111 by Tanda Rockers, RN Outcome: Progressing   Problem: Skin Integrity: Goal: Will show signs of wound healing 05/19/2022 1111 by Tanda Rockers, RN Outcome: Adequate for Discharge 05/19/2022 1111 by Tanda Rockers, RN Outcome: Progressing   Problem: Education: Goal: Knowledge of General Education information will improve Description: Including pain rating scale, medication(s)/side effects and non-pharmacologic comfort measures 05/19/2022 1111 by Tanda Rockers, RN Outcome: Adequate for Discharge 05/19/2022 1111 by Tanda Rockers, RN Outcome: Progressing   Problem: Health Behavior/Discharge Planning: Goal: Ability to manage health-related needs will improve 05/19/2022 1111 by Tanda Rockers, RN Outcome: Adequate for Discharge 05/19/2022 1111 by Tanda Rockers, RN Outcome: Progressing   Problem: Clinical Measurements: Goal: Ability to maintain clinical measurements within normal limits will improve 05/19/2022 1111 by Tanda Rockers, RN Outcome: Adequate for Discharge 05/19/2022 1111 by Tanda Rockers, RN Outcome: Progressing Goal: Will remain free from infection 05/19/2022 1111 by Tanda Rockers, RN Outcome: Adequate for Discharge 05/19/2022 1111 by Tanda Rockers, RN Outcome: Progressing Goal: Diagnostic test results will improve 05/19/2022 1111 by Tanda Rockers, RN Outcome: Adequate for Discharge 05/19/2022 1111 by Tanda Rockers, RN Outcome: Progressing Goal: Respiratory complications will improve 05/19/2022 1111 by Tanda Rockers, RN Outcome: Adequate for Discharge 05/19/2022 1111 by Tanda Rockers, RN Outcome: Progressing Goal: Cardiovascular complication will be avoided 05/19/2022 1111 by Tanda Rockers, RN Outcome: Adequate for Discharge 05/19/2022 1111 by Tanda Rockers, RN Outcome: Progressing   Problem: Activity: Goal: Risk for activity intolerance will decrease 05/19/2022 1111 by Tanda Rockers, RN Outcome: Adequate for Discharge 05/19/2022 1111 by Tanda Rockers, RN Outcome: Progressing   Problem: Nutrition: Goal: Adequate nutrition will be maintained 05/19/2022 1111 by Tanda Rockers, RN Outcome: Adequate for Discharge 05/19/2022 1111 by Tanda Rockers, RN Outcome: Progressing   Problem: Coping: Goal: Level of anxiety will decrease 05/19/2022 1111 by Tanda Rockers, RN Outcome: Adequate for Discharge 05/19/2022 1111 by Tanda Rockers,  RN Outcome: Progressing   Problem: Elimination: Goal: Will not experience complications related to bowel motility 05/19/2022 1111 by Tanda Rockers, RN Outcome: Adequate for Discharge 05/19/2022 1111 by Tanda Rockers, RN Outcome: Progressing Goal: Will not experience complications related to urinary retention 05/19/2022 1111 by Tanda Rockers, RN  Outcome: Adequate for Discharge 05/19/2022 1111 by Tanda Rockers, RN Outcome: Progressing   Problem: Pain Managment: Goal: General experience of comfort will improve 05/19/2022 1111 by Tanda Rockers, RN Outcome: Adequate for Discharge 05/19/2022 1111 by Tanda Rockers, RN Outcome: Progressing   Problem: Safety: Goal: Ability to remain free from injury will improve 05/19/2022 1111 by Tanda Rockers, RN Outcome: Adequate for Discharge 05/19/2022 1111 by Tanda Rockers, RN Outcome: Progressing   Problem: Skin Integrity: Goal: Risk for impaired skin integrity will decrease 05/19/2022 1111 by Tanda Rockers, RN Outcome: Adequate for Discharge 05/19/2022 1111 by Tanda Rockers, RN Outcome: Progressing   Problem: Education: Goal: Knowledge of the prescribed therapeutic regimen will improve 05/19/2022 1111 by Tanda Rockers, RN Outcome: Adequate for Discharge 05/19/2022 1111 by Tanda Rockers, RN Outcome: Progressing Goal: Understanding of discharge needs will improve 05/19/2022 1111 by Tanda Rockers, RN Outcome: Adequate for Discharge 05/19/2022 1111 by Tanda Rockers, RN Outcome: Progressing Goal: Individualized Educational Video(s) 05/19/2022 1111 by Tanda Rockers, RN Outcome: Adequate for Discharge 05/19/2022 1111 by Tanda Rockers, RN Outcome: Progressing   Problem: Activity: Goal: Ability to avoid complications of mobility impairment will improve 05/19/2022 1111 by Tanda Rockers, RN Outcome: Adequate for Discharge 05/19/2022 1111 by Tanda Rockers, RN Outcome:  Progressing Goal: Ability to tolerate increased activity will improve 05/19/2022 1111 by Tanda Rockers, RN Outcome: Adequate for Discharge 05/19/2022 1111 by Tanda Rockers, RN Outcome: Progressing   Problem: Clinical Measurements: Goal: Postoperative complications will be avoided or minimized 05/19/2022 1111 by Tanda Rockers, RN Outcome: Adequate for Discharge 05/19/2022 1111 by Tanda Rockers, RN Outcome: Progressing   Problem: Pain Management: Goal: Pain level will decrease with appropriate interventions 05/19/2022 1111 by Tanda Rockers, RN Outcome: Adequate for Discharge 05/19/2022 1111 by Tanda Rockers, RN Outcome: Progressing   Problem: Skin Integrity: Goal: Will show signs of wound healing 05/19/2022 1111 by Tanda Rockers, RN Outcome: Adequate for Discharge 05/19/2022 1111 by Tanda Rockers, RN Outcome: Progressing   Problem: Education: Goal: Knowledge of General Education information will improve Description: Including pain rating scale, medication(s)/side effects and non-pharmacologic comfort measures 05/19/2022 1111 by Tanda Rockers, RN Outcome: Adequate for Discharge 05/19/2022 1111 by Tanda Rockers, RN Outcome: Progressing   Problem: Health Behavior/Discharge Planning: Goal: Ability to manage health-related needs will improve 05/19/2022 1111 by Tanda Rockers, RN Outcome: Adequate for Discharge 05/19/2022 1111 by Tanda Rockers, RN Outcome: Progressing   Problem: Clinical Measurements: Goal: Ability to maintain clinical measurements within normal limits will improve 05/19/2022 1111 by Tanda Rockers, RN Outcome: Adequate for Discharge 05/19/2022 1111 by Tanda Rockers, RN Outcome: Progressing Goal: Will remain free from infection 05/19/2022 1111 by Tanda Rockers, RN Outcome: Adequate for Discharge 05/19/2022 1111 by Tanda Rockers, RN Outcome: Progressing Goal: Diagnostic test results will  improve 05/19/2022 1111 by Tanda Rockers, RN Outcome: Adequate for Discharge 05/19/2022 1111 by Tanda Rockers, RN Outcome: Progressing Goal: Respiratory complications will improve 05/19/2022 1111 by Tanda Rockers, RN Outcome: Adequate for Discharge 05/19/2022 1111 by Tanda Rockers, RN Outcome: Progressing Goal: Cardiovascular complication will be avoided 05/19/2022 1111 by Tanda Rockers, RN Outcome: Adequate for Discharge 05/19/2022 1111 by Tanda Rockers, RN Outcome: Progressing   Problem: Activity: Goal: Risk for activity intolerance will decrease 05/19/2022 1111 by Tanda Rockers, RN Outcome: Adequate for Discharge 05/19/2022 1111 by Alphonzo Lemmings  V, RN Outcome: Progressing   Problem: Nutrition: Goal: Adequate nutrition will be maintained 05/19/2022 1111 by Tanda Rockers, RN Outcome: Adequate for Discharge 05/19/2022 1111 by Tanda Rockers, RN Outcome: Progressing   Problem: Coping: Goal: Level of anxiety will decrease 05/19/2022 1111 by Tanda Rockers, RN Outcome: Adequate for Discharge 05/19/2022 1111 by Tanda Rockers, RN Outcome: Progressing   Problem: Elimination: Goal: Will not experience complications related to bowel motility 05/19/2022 1111 by Tanda Rockers, RN Outcome: Adequate for Discharge 05/19/2022 1111 by Tanda Rockers, RN Outcome: Progressing Goal: Will not experience complications related to urinary retention 05/19/2022 1111 by Tanda Rockers, RN Outcome: Adequate for Discharge 05/19/2022 1111 by Tanda Rockers, RN Outcome: Progressing   Problem: Pain Managment: Goal: General experience of comfort will improve 05/19/2022 1111 by Tanda Rockers, RN Outcome: Adequate for Discharge 05/19/2022 1111 by Tanda Rockers, RN Outcome: Progressing   Problem: Safety: Goal: Ability to remain free from injury will improve 05/19/2022 1111 by Tanda Rockers, RN Outcome: Adequate for  Discharge 05/19/2022 1111 by Tanda Rockers, RN Outcome: Progressing   Problem: Skin Integrity: Goal: Risk for impaired skin integrity will decrease 05/19/2022 1111 by Tanda Rockers, RN Outcome: Adequate for Discharge 05/19/2022 1111 by Tanda Rockers, RN Outcome: Progressing

## 2022-05-19 NOTE — Progress Notes (Signed)
Physical Therapy Treatment Patient Details Name: [name] MRN: [id number] DOB: November 03, 1955 Today's Date: 05/19/2022   History of Present Illness 67 yo female presents to therapy s/p R THA, anterior approach on 05/18/2022 due to failure of conservative measures. Pt PMH includes but is not limited to: asthma, DM II, positional orthostatic, pacemaker, malignant melanoma, brain, liver and lung metastasis s/p radiation and L parietal craniotomy, diverticulitis, HTN, CVA, and DVT.    PT Comments    Pt agreeable to working with therapy. Reviewed/practiced exercises, gait training and stair training. Hip pain controlled however pt reports back pain from being in bed/not being able to sleep in her normal sidelying position. Encouraged pt to ambulate often at home, as tolerated. She plans to begin HHPT on Monday. Answered questions from pt/husband. All PT education completed.     Recommendations for follow up therapy are one component of a multi-disciplinary discharge planning process, led by the attending physician.  Recommendations may be updated based on patient status, additional functional criteria and insurance authorization.  Follow Up Recommendations  Home health PT     Assistance Recommended at Discharge Intermittent Supervision/Assistance  Patient can return home with the following A little help with walking and/or transfers;A little help with bathing/dressing/bathroom;Assistance with cooking/housework;Assist for transportation;Help with stairs or ramp for entrance   Equipment Recommendations  None recommended by PT    Recommendations for Other Services       Precautions / Restrictions Precautions Precautions: Fall Restrictions Weight Bearing Restrictions: No RLE Weight Bearing: Weight bearing as tolerated     Mobility  Bed Mobility Overal bed mobility: Needs Assistance Bed Mobility: Supine to Sit, Sit to Supine     Supine to sit: Min guard, HOB elevated Sit to supine:  Min guard, HOB elevated   General bed mobility comments: increased time, HOB elevated and use of L bed rail    Transfers Overall transfer level: Needs assistance Equipment used: Rolling walker (2 wheels) Transfers: Sit to/from Stand Sit to Stand: Min guard           General transfer comment: cues for proper UE and AD placement    Ambulation/Gait Ambulation/Gait assistance: Min guard Gait Distance (Feet): 300 Feet Assistive device: Rolling walker (2 wheels) Gait Pattern/deviations: Step-through pattern, Decreased stride length       General Gait Details: min guard A for safety. pt tolerated distance well.   Stairs Stairs: Yes Stairs assistance: Min guard Stair Management: Step to pattern, Forwards, Two rails Number of Stairs: 4 General stair comments: cues for safety, technique, sequence. min guard A. husband present to observe.   Wheelchair Mobility    Modified Rankin (Stroke Patients Only)       Balance Overall balance assessment: Needs assistance         Standing balance support: Reliant on assistive device for balance, Bilateral upper extremity supported, During functional activity Standing balance-Leahy Scale: Fair                              Cognition Arousal/Alertness: Awake/alert Behavior During Therapy: WFL for tasks assessed/performed Overall Cognitive Status: Within Functional Limits for tasks assessed                                          Exercises Total Joint Exercises Ankle Circles/Pumps: AROM, Both, 10 reps Quad Sets: AROM, Both, 10  reps Heel Slides: AAROM, Right, 10 reps (pt used gait belt to A) Hip ABduction/ADduction: AAROM, Right, 10 reps (pt used gait belt to A)    General Comments        Pertinent Vitals/Pain Pain Assessment Pain Assessment: 0-10 Pain Score: 5  Pain Location: R hip, back Pain Descriptors / Indicators: Aching, Discomfort, Operative site guarding Pain Intervention(s):  Monitored during session, Repositioned, Ice applied    Home Living                          Prior Function            PT Goals (current goals can now be found in the care plan section) Progress towards PT goals: Progressing toward goals    Frequency    7X/week      PT Plan Current plan remains appropriate    Co-evaluation              AM-PAC PT "6 Clicks" Mobility   Outcome Measure  Help needed turning from your back to your side while in a flat bed without using bedrails?: A Little Help needed moving from lying on your back to sitting on the side of a flat bed without using bedrails?: A Little Help needed moving to and from a bed to a chair (including a wheelchair)?: A Little Help needed standing up from a chair using your arms (e.g., wheelchair or bedside chair)?: A Little Help needed to walk in hospital room?: A Little Help needed climbing 3-5 steps with a railing? : A Little 6 Click Score: 18    End of Session Equipment Utilized During Treatment: Gait belt Activity Tolerance: Patient tolerated treatment well Patient left: in bed;with call bell/phone within reach;with family/visitor present   PT Visit Diagnosis: Unsteadiness on feet (R26.81);Other abnormalities of gait and mobility (R26.89);Muscle weakness (generalized) (M62.81);Pain Pain - Right/Left: Right Pain - part of body: Hip (back)     Time: [id number] PT Time Calculation (min) (ACUTE ONLY): 35 min  Charges:  $Gait Training: 8-22 mins $Therapeutic Exercise: 8-22 mins                         Doreatha Massed, PT Acute Rehabilitation  Office: [phone]

## 2022-05-20 ENCOUNTER — Encounter (HOSPITAL_COMMUNITY): Payer: Self-pay | Admitting: Orthopaedic Surgery

## 2022-05-20 NOTE — Anesthesia Postprocedure Evaluation (Signed)
Anesthesia Post Note  Patient: [name]  Procedure(s) Performed: RIGHT TOTAL HIP ARTHROPLASTY ANTERIOR APPROACH (Right: Hip)     Patient location during evaluation: PACU Anesthesia Type: MAC and Spinal Level of consciousness: oriented and awake and alert Pain management: pain level controlled Vital Signs Assessment: post-procedure vital signs reviewed and stable Respiratory status: spontaneous breathing, respiratory function stable and patient connected to nasal cannula oxygen Cardiovascular status: blood pressure returned to baseline and stable Postop Assessment: no headache, no backache and no apparent nausea or vomiting Anesthetic complications: no   No notable events documented.  Last Vitals:  Vitals:   05/19/22 0159 05/19/22 1029  BP: 112/73 131/83  Pulse: 68 73  Resp: 17 16  Temp: 36.6 C 36.9 C  SpO2: 100% 100%    Last Pain:  Vitals:   05/19/22 1029  TempSrc: Oral  PainSc:                  [place]

## 2022-05-21 ENCOUNTER — Other Ambulatory Visit: Payer: Self-pay

## 2022-05-21 DIAGNOSIS — Z96651 Presence of right artificial knee joint: Secondary | ICD-10-CM | POA: Diagnosis not present

## 2022-05-21 DIAGNOSIS — E785 Hyperlipidemia, unspecified: Secondary | ICD-10-CM | POA: Diagnosis not present

## 2022-05-21 DIAGNOSIS — E1151 Type 2 diabetes mellitus with diabetic peripheral angiopathy without gangrene: Secondary | ICD-10-CM | POA: Diagnosis not present

## 2022-05-21 DIAGNOSIS — K219 Gastro-esophageal reflux disease without esophagitis: Secondary | ICD-10-CM | POA: Diagnosis not present

## 2022-05-21 DIAGNOSIS — I4719 Other supraventricular tachycardia: Secondary | ICD-10-CM | POA: Diagnosis not present

## 2022-05-21 DIAGNOSIS — D649 Anemia, unspecified: Secondary | ICD-10-CM | POA: Diagnosis not present

## 2022-05-21 DIAGNOSIS — J4540 Moderate persistent asthma, uncomplicated: Secondary | ICD-10-CM | POA: Diagnosis not present

## 2022-05-21 DIAGNOSIS — G90A Postural orthostatic tachycardia syndrome (POTS): Secondary | ICD-10-CM | POA: Diagnosis not present

## 2022-05-21 DIAGNOSIS — G901 Familial dysautonomia [Riley-Day]: Secondary | ICD-10-CM | POA: Diagnosis not present

## 2022-05-21 DIAGNOSIS — I498 Other specified cardiac arrhythmias: Secondary | ICD-10-CM | POA: Diagnosis not present

## 2022-05-21 DIAGNOSIS — M1990 Unspecified osteoarthritis, unspecified site: Secondary | ICD-10-CM | POA: Diagnosis not present

## 2022-05-21 DIAGNOSIS — M8580 Other specified disorders of bone density and structure, unspecified site: Secondary | ICD-10-CM | POA: Diagnosis not present

## 2022-05-21 DIAGNOSIS — Z471 Aftercare following joint replacement surgery: Secondary | ICD-10-CM | POA: Diagnosis not present

## 2022-05-21 DIAGNOSIS — G9001 Carotid sinus syncope: Secondary | ICD-10-CM | POA: Diagnosis not present

## 2022-05-21 DIAGNOSIS — K5790 Diverticulosis of intestine, part unspecified, without perforation or abscess without bleeding: Secondary | ICD-10-CM | POA: Diagnosis not present

## 2022-05-21 DIAGNOSIS — H1010 Acute atopic conjunctivitis, unspecified eye: Secondary | ICD-10-CM | POA: Diagnosis not present

## 2022-05-21 DIAGNOSIS — N390 Urinary tract infection, site not specified: Secondary | ICD-10-CM | POA: Diagnosis not present

## 2022-05-21 DIAGNOSIS — K760 Fatty (change of) liver, not elsewhere classified: Secondary | ICD-10-CM | POA: Diagnosis not present

## 2022-05-21 DIAGNOSIS — I10 Essential (primary) hypertension: Secondary | ICD-10-CM | POA: Diagnosis not present

## 2022-05-21 DIAGNOSIS — I4891 Unspecified atrial fibrillation: Secondary | ICD-10-CM | POA: Diagnosis not present

## 2022-05-21 DIAGNOSIS — L500 Allergic urticaria: Secondary | ICD-10-CM | POA: Diagnosis not present

## 2022-05-21 DIAGNOSIS — G939 Disorder of brain, unspecified: Secondary | ICD-10-CM | POA: Diagnosis not present

## 2022-05-21 DIAGNOSIS — J3089 Other allergic rhinitis: Secondary | ICD-10-CM | POA: Diagnosis not present

## 2022-05-22 ENCOUNTER — Telehealth: Payer: Self-pay

## 2022-05-22 ENCOUNTER — Telehealth: Payer: Self-pay | Admitting: *Deleted

## 2022-05-22 NOTE — Telephone Encounter (Signed)
05/21/22- Spoke with patient for D/C call; doing well functionally. Had multiple issues while at [facility] on Friday into Saturday. She will speak with patient care services regarding these issues.

## 2022-05-22 NOTE — Progress Notes (Signed)
Care Management & Coordination Services Pharmacy Team  Reason for Encounter: Appointment Reminder  Contacted patient to confirm telephone appointment with Burman Riis, PharmD on 05/23/2022 at 4:00. Spoke with patient on 05/22/2022, she recently had hip surgery and she has physical therapy on 05/23/2022 at 4:00, patients appointment has been rescheduled to 06/08/22 at 1:30  Do you have any problems getting your medications?  If yes what types of problems are you experiencing?   What is your top health concern you would like to discuss at your upcoming visit?   Have you seen any other providers since your last visit with PCP?   Care Gaps: AWV - completed 03/15/2022 Last eye exam - neverdone Last foot exam - never done Last BP - 110/70 on 05/17/2022 Last A1C - 5.7 on 05/08/2022 Tdap - never done Pap smear - never done Mammogram - overdue Flu - postponed Hep C screen - postponed HIV - postponed Urine ACR - postponed Shingrix - postponed   Star Rating Drugs: None  [place] Pharmacist Assistant [phone]

## 2022-05-24 ENCOUNTER — Telehealth: Payer: Self-pay

## 2022-05-24 DIAGNOSIS — K5790 Diverticulosis of intestine, part unspecified, without perforation or abscess without bleeding: Secondary | ICD-10-CM | POA: Diagnosis not present

## 2022-05-24 DIAGNOSIS — M1990 Unspecified osteoarthritis, unspecified site: Secondary | ICD-10-CM | POA: Diagnosis not present

## 2022-05-24 DIAGNOSIS — K219 Gastro-esophageal reflux disease without esophagitis: Secondary | ICD-10-CM | POA: Diagnosis not present

## 2022-05-24 DIAGNOSIS — I10 Essential (primary) hypertension: Secondary | ICD-10-CM | POA: Diagnosis not present

## 2022-05-24 DIAGNOSIS — E1151 Type 2 diabetes mellitus with diabetic peripheral angiopathy without gangrene: Secondary | ICD-10-CM | POA: Diagnosis not present

## 2022-05-24 DIAGNOSIS — G939 Disorder of brain, unspecified: Secondary | ICD-10-CM | POA: Diagnosis not present

## 2022-05-24 DIAGNOSIS — K760 Fatty (change of) liver, not elsewhere classified: Secondary | ICD-10-CM | POA: Diagnosis not present

## 2022-05-24 DIAGNOSIS — M8580 Other specified disorders of bone density and structure, unspecified site: Secondary | ICD-10-CM | POA: Diagnosis not present

## 2022-05-24 DIAGNOSIS — I498 Other specified cardiac arrhythmias: Secondary | ICD-10-CM | POA: Diagnosis not present

## 2022-05-24 DIAGNOSIS — Z471 Aftercare following joint replacement surgery: Secondary | ICD-10-CM | POA: Diagnosis not present

## 2022-05-24 DIAGNOSIS — G9001 Carotid sinus syncope: Secondary | ICD-10-CM | POA: Diagnosis not present

## 2022-05-24 DIAGNOSIS — I4891 Unspecified atrial fibrillation: Secondary | ICD-10-CM | POA: Diagnosis not present

## 2022-05-24 DIAGNOSIS — J3089 Other allergic rhinitis: Secondary | ICD-10-CM | POA: Diagnosis not present

## 2022-05-24 DIAGNOSIS — E785 Hyperlipidemia, unspecified: Secondary | ICD-10-CM | POA: Diagnosis not present

## 2022-05-24 DIAGNOSIS — N390 Urinary tract infection, site not specified: Secondary | ICD-10-CM | POA: Diagnosis not present

## 2022-05-24 DIAGNOSIS — G901 Familial dysautonomia [Riley-Day]: Secondary | ICD-10-CM | POA: Diagnosis not present

## 2022-05-24 DIAGNOSIS — G90A Postural orthostatic tachycardia syndrome (POTS): Secondary | ICD-10-CM | POA: Diagnosis not present

## 2022-05-24 DIAGNOSIS — L500 Allergic urticaria: Secondary | ICD-10-CM | POA: Diagnosis not present

## 2022-05-24 DIAGNOSIS — I4719 Other supraventricular tachycardia: Secondary | ICD-10-CM | POA: Diagnosis not present

## 2022-05-24 DIAGNOSIS — H1010 Acute atopic conjunctivitis, unspecified eye: Secondary | ICD-10-CM | POA: Diagnosis not present

## 2022-05-24 DIAGNOSIS — J4540 Moderate persistent asthma, uncomplicated: Secondary | ICD-10-CM | POA: Diagnosis not present

## 2022-05-24 DIAGNOSIS — Z96651 Presence of right artificial knee joint: Secondary | ICD-10-CM | POA: Diagnosis not present

## 2022-05-24 DIAGNOSIS — D649 Anemia, unspecified: Secondary | ICD-10-CM | POA: Diagnosis not present

## 2022-05-24 NOTE — Telephone Encounter (Signed)
[name] with [facility] called about patient. Right THA 05/18/2022. They are seeing her for PT but states that patient was told she needed to be changing her dressing at her hip. Patient is not comfortable doing this. No nursing orders have been ordered for [facility], and they are short on staff. She is going to have the therapist change dressing if needed today. I do not see anything in chart stating that patient needs to be changing her dressing before her postop appointment with Dr.Blackman. Please advise if anything else needs to be done. Tony's call back [phone]

## 2022-05-25 DIAGNOSIS — J3089 Other allergic rhinitis: Secondary | ICD-10-CM | POA: Diagnosis not present

## 2022-05-25 DIAGNOSIS — E1151 Type 2 diabetes mellitus with diabetic peripheral angiopathy without gangrene: Secondary | ICD-10-CM | POA: Diagnosis not present

## 2022-05-25 DIAGNOSIS — G90A Postural orthostatic tachycardia syndrome (POTS): Secondary | ICD-10-CM | POA: Diagnosis not present

## 2022-05-25 DIAGNOSIS — D649 Anemia, unspecified: Secondary | ICD-10-CM | POA: Diagnosis not present

## 2022-05-25 DIAGNOSIS — G9001 Carotid sinus syncope: Secondary | ICD-10-CM | POA: Diagnosis not present

## 2022-05-25 DIAGNOSIS — J4540 Moderate persistent asthma, uncomplicated: Secondary | ICD-10-CM | POA: Diagnosis not present

## 2022-05-25 DIAGNOSIS — K760 Fatty (change of) liver, not elsewhere classified: Secondary | ICD-10-CM | POA: Diagnosis not present

## 2022-05-25 DIAGNOSIS — Z96651 Presence of right artificial knee joint: Secondary | ICD-10-CM | POA: Diagnosis not present

## 2022-05-25 DIAGNOSIS — I4719 Other supraventricular tachycardia: Secondary | ICD-10-CM | POA: Diagnosis not present

## 2022-05-25 DIAGNOSIS — H1010 Acute atopic conjunctivitis, unspecified eye: Secondary | ICD-10-CM | POA: Diagnosis not present

## 2022-05-25 DIAGNOSIS — L500 Allergic urticaria: Secondary | ICD-10-CM | POA: Diagnosis not present

## 2022-05-25 DIAGNOSIS — K219 Gastro-esophageal reflux disease without esophagitis: Secondary | ICD-10-CM | POA: Diagnosis not present

## 2022-05-25 DIAGNOSIS — G901 Familial dysautonomia [Riley-Day]: Secondary | ICD-10-CM | POA: Diagnosis not present

## 2022-05-25 DIAGNOSIS — G939 Disorder of brain, unspecified: Secondary | ICD-10-CM | POA: Diagnosis not present

## 2022-05-25 DIAGNOSIS — I498 Other specified cardiac arrhythmias: Secondary | ICD-10-CM | POA: Diagnosis not present

## 2022-05-25 DIAGNOSIS — M8580 Other specified disorders of bone density and structure, unspecified site: Secondary | ICD-10-CM | POA: Diagnosis not present

## 2022-05-25 DIAGNOSIS — I4891 Unspecified atrial fibrillation: Secondary | ICD-10-CM | POA: Diagnosis not present

## 2022-05-25 DIAGNOSIS — M1990 Unspecified osteoarthritis, unspecified site: Secondary | ICD-10-CM | POA: Diagnosis not present

## 2022-05-25 DIAGNOSIS — E785 Hyperlipidemia, unspecified: Secondary | ICD-10-CM | POA: Diagnosis not present

## 2022-05-25 DIAGNOSIS — K5790 Diverticulosis of intestine, part unspecified, without perforation or abscess without bleeding: Secondary | ICD-10-CM | POA: Diagnosis not present

## 2022-05-25 DIAGNOSIS — Z471 Aftercare following joint replacement surgery: Secondary | ICD-10-CM | POA: Diagnosis not present

## 2022-05-25 DIAGNOSIS — N390 Urinary tract infection, site not specified: Secondary | ICD-10-CM | POA: Diagnosis not present

## 2022-05-25 DIAGNOSIS — I10 Essential (primary) hypertension: Secondary | ICD-10-CM | POA: Diagnosis not present

## 2022-05-28 DIAGNOSIS — Z471 Aftercare following joint replacement surgery: Secondary | ICD-10-CM | POA: Diagnosis not present

## 2022-05-28 DIAGNOSIS — K219 Gastro-esophageal reflux disease without esophagitis: Secondary | ICD-10-CM | POA: Diagnosis not present

## 2022-05-28 DIAGNOSIS — J4540 Moderate persistent asthma, uncomplicated: Secondary | ICD-10-CM | POA: Diagnosis not present

## 2022-05-28 DIAGNOSIS — G901 Familial dysautonomia [Riley-Day]: Secondary | ICD-10-CM | POA: Diagnosis not present

## 2022-05-28 DIAGNOSIS — I4719 Other supraventricular tachycardia: Secondary | ICD-10-CM | POA: Diagnosis not present

## 2022-05-28 DIAGNOSIS — I498 Other specified cardiac arrhythmias: Secondary | ICD-10-CM | POA: Diagnosis not present

## 2022-05-28 DIAGNOSIS — G939 Disorder of brain, unspecified: Secondary | ICD-10-CM | POA: Diagnosis not present

## 2022-05-28 DIAGNOSIS — H1010 Acute atopic conjunctivitis, unspecified eye: Secondary | ICD-10-CM | POA: Diagnosis not present

## 2022-05-28 DIAGNOSIS — E785 Hyperlipidemia, unspecified: Secondary | ICD-10-CM | POA: Diagnosis not present

## 2022-05-28 DIAGNOSIS — M8580 Other specified disorders of bone density and structure, unspecified site: Secondary | ICD-10-CM | POA: Diagnosis not present

## 2022-05-28 DIAGNOSIS — I4891 Unspecified atrial fibrillation: Secondary | ICD-10-CM | POA: Diagnosis not present

## 2022-05-28 DIAGNOSIS — E1151 Type 2 diabetes mellitus with diabetic peripheral angiopathy without gangrene: Secondary | ICD-10-CM | POA: Diagnosis not present

## 2022-05-28 DIAGNOSIS — Z96651 Presence of right artificial knee joint: Secondary | ICD-10-CM | POA: Diagnosis not present

## 2022-05-28 DIAGNOSIS — N390 Urinary tract infection, site not specified: Secondary | ICD-10-CM | POA: Diagnosis not present

## 2022-05-28 DIAGNOSIS — M1990 Unspecified osteoarthritis, unspecified site: Secondary | ICD-10-CM | POA: Diagnosis not present

## 2022-05-28 DIAGNOSIS — D649 Anemia, unspecified: Secondary | ICD-10-CM | POA: Diagnosis not present

## 2022-05-28 DIAGNOSIS — G90A Postural orthostatic tachycardia syndrome (POTS): Secondary | ICD-10-CM | POA: Diagnosis not present

## 2022-05-28 DIAGNOSIS — G9001 Carotid sinus syncope: Secondary | ICD-10-CM | POA: Diagnosis not present

## 2022-05-28 DIAGNOSIS — I10 Essential (primary) hypertension: Secondary | ICD-10-CM | POA: Diagnosis not present

## 2022-05-28 DIAGNOSIS — K760 Fatty (change of) liver, not elsewhere classified: Secondary | ICD-10-CM | POA: Diagnosis not present

## 2022-05-28 DIAGNOSIS — L500 Allergic urticaria: Secondary | ICD-10-CM | POA: Diagnosis not present

## 2022-05-28 DIAGNOSIS — J3089 Other allergic rhinitis: Secondary | ICD-10-CM | POA: Diagnosis not present

## 2022-05-28 DIAGNOSIS — K5790 Diverticulosis of intestine, part unspecified, without perforation or abscess without bleeding: Secondary | ICD-10-CM | POA: Diagnosis not present

## 2022-05-30 DIAGNOSIS — E785 Hyperlipidemia, unspecified: Secondary | ICD-10-CM | POA: Diagnosis not present

## 2022-05-30 DIAGNOSIS — G90A Postural orthostatic tachycardia syndrome (POTS): Secondary | ICD-10-CM | POA: Diagnosis not present

## 2022-05-30 DIAGNOSIS — Z96651 Presence of right artificial knee joint: Secondary | ICD-10-CM | POA: Diagnosis not present

## 2022-05-30 DIAGNOSIS — J4540 Moderate persistent asthma, uncomplicated: Secondary | ICD-10-CM | POA: Diagnosis not present

## 2022-05-30 DIAGNOSIS — Z471 Aftercare following joint replacement surgery: Secondary | ICD-10-CM | POA: Diagnosis not present

## 2022-05-30 DIAGNOSIS — K5790 Diverticulosis of intestine, part unspecified, without perforation or abscess without bleeding: Secondary | ICD-10-CM | POA: Diagnosis not present

## 2022-05-30 DIAGNOSIS — M8580 Other specified disorders of bone density and structure, unspecified site: Secondary | ICD-10-CM | POA: Diagnosis not present

## 2022-05-30 DIAGNOSIS — G939 Disorder of brain, unspecified: Secondary | ICD-10-CM | POA: Diagnosis not present

## 2022-05-30 DIAGNOSIS — K760 Fatty (change of) liver, not elsewhere classified: Secondary | ICD-10-CM | POA: Diagnosis not present

## 2022-05-30 DIAGNOSIS — G901 Familial dysautonomia [Riley-Day]: Secondary | ICD-10-CM | POA: Diagnosis not present

## 2022-05-30 DIAGNOSIS — N390 Urinary tract infection, site not specified: Secondary | ICD-10-CM | POA: Diagnosis not present

## 2022-05-30 DIAGNOSIS — I498 Other specified cardiac arrhythmias: Secondary | ICD-10-CM | POA: Diagnosis not present

## 2022-05-30 DIAGNOSIS — L500 Allergic urticaria: Secondary | ICD-10-CM | POA: Diagnosis not present

## 2022-05-30 DIAGNOSIS — I4719 Other supraventricular tachycardia: Secondary | ICD-10-CM | POA: Diagnosis not present

## 2022-05-30 DIAGNOSIS — I10 Essential (primary) hypertension: Secondary | ICD-10-CM | POA: Diagnosis not present

## 2022-05-30 DIAGNOSIS — M1990 Unspecified osteoarthritis, unspecified site: Secondary | ICD-10-CM | POA: Diagnosis not present

## 2022-05-30 DIAGNOSIS — I4891 Unspecified atrial fibrillation: Secondary | ICD-10-CM | POA: Diagnosis not present

## 2022-05-30 DIAGNOSIS — G9001 Carotid sinus syncope: Secondary | ICD-10-CM | POA: Diagnosis not present

## 2022-05-30 DIAGNOSIS — H1010 Acute atopic conjunctivitis, unspecified eye: Secondary | ICD-10-CM | POA: Diagnosis not present

## 2022-05-30 DIAGNOSIS — K219 Gastro-esophageal reflux disease without esophagitis: Secondary | ICD-10-CM | POA: Diagnosis not present

## 2022-05-30 DIAGNOSIS — E1151 Type 2 diabetes mellitus with diabetic peripheral angiopathy without gangrene: Secondary | ICD-10-CM | POA: Diagnosis not present

## 2022-05-30 DIAGNOSIS — D649 Anemia, unspecified: Secondary | ICD-10-CM | POA: Diagnosis not present

## 2022-05-30 DIAGNOSIS — J3089 Other allergic rhinitis: Secondary | ICD-10-CM | POA: Diagnosis not present

## 2022-05-31 ENCOUNTER — Ambulatory Visit (INDEPENDENT_AMBULATORY_CARE_PROVIDER_SITE_OTHER): Payer: Medicare Other | Admitting: Orthopaedic Surgery

## 2022-05-31 ENCOUNTER — Encounter: Payer: Self-pay | Admitting: Orthopaedic Surgery

## 2022-05-31 ENCOUNTER — Other Ambulatory Visit: Payer: Self-pay | Admitting: *Deleted

## 2022-05-31 ENCOUNTER — Telehealth: Payer: Self-pay | Admitting: *Deleted

## 2022-05-31 DIAGNOSIS — Z96641 Presence of right artificial hip joint: Secondary | ICD-10-CM

## 2022-05-31 DIAGNOSIS — M87051 Idiopathic aseptic necrosis of right femur: Secondary | ICD-10-CM

## 2022-05-31 MED ORDER — IBUPROFEN 800 MG PO TABS: 800.0000 mg | ORAL_TABLET | Freq: Three times a day (TID) | ORAL | 1 refills | Status: DC | PRN

## 2022-05-31 NOTE — Telephone Encounter (Signed)
Ortho bundle 14 day in office meeting completed. °

## 2022-05-31 NOTE — Progress Notes (Signed)
The patient is 2 weeks tomorrow status post a right total hip arthroplasty.  She has been taking a full-strength aspirin twice daily and using pain medications only on occasion at night.  She is still on a walker and has had home therapy but really needs a few sessions of outpatient therapy in order to transition from a walker, to a cane and then do nothing.  She does report right knee pain and swelling and I did explain to her that it is a result of the surgery itself in terms of the way we rotated the knee and that will calm down with time.  With that being said, we both agreed that we will send in 800 mg ibuprofen to take up to 3 times a day with meals if needed for pain and inflammation.  Her right hip incision looks great.  The staples have been removed and Steri-Strips applied.  There is no significant seroma or hematoma.  We will set up a few sessions of outpatient physical therapy for her and I would like to see her back in 4 weeks to see how she is doing from a mobility standpoint but no x-rays are needed.

## 2022-06-01 ENCOUNTER — Telehealth: Payer: Self-pay | Admitting: *Deleted

## 2022-06-01 ENCOUNTER — Telehealth: Payer: Self-pay | Admitting: Orthopaedic Surgery

## 2022-06-01 DIAGNOSIS — L500 Allergic urticaria: Secondary | ICD-10-CM | POA: Diagnosis not present

## 2022-06-01 DIAGNOSIS — N390 Urinary tract infection, site not specified: Secondary | ICD-10-CM | POA: Diagnosis not present

## 2022-06-01 DIAGNOSIS — G939 Disorder of brain, unspecified: Secondary | ICD-10-CM | POA: Diagnosis not present

## 2022-06-01 DIAGNOSIS — G9001 Carotid sinus syncope: Secondary | ICD-10-CM | POA: Diagnosis not present

## 2022-06-01 DIAGNOSIS — G90A Postural orthostatic tachycardia syndrome (POTS): Secondary | ICD-10-CM | POA: Diagnosis not present

## 2022-06-01 DIAGNOSIS — E1151 Type 2 diabetes mellitus with diabetic peripheral angiopathy without gangrene: Secondary | ICD-10-CM | POA: Diagnosis not present

## 2022-06-01 DIAGNOSIS — I10 Essential (primary) hypertension: Secondary | ICD-10-CM | POA: Diagnosis not present

## 2022-06-01 DIAGNOSIS — E785 Hyperlipidemia, unspecified: Secondary | ICD-10-CM | POA: Diagnosis not present

## 2022-06-01 DIAGNOSIS — G901 Familial dysautonomia [Riley-Day]: Secondary | ICD-10-CM | POA: Diagnosis not present

## 2022-06-01 DIAGNOSIS — K760 Fatty (change of) liver, not elsewhere classified: Secondary | ICD-10-CM | POA: Diagnosis not present

## 2022-06-01 DIAGNOSIS — I4719 Other supraventricular tachycardia: Secondary | ICD-10-CM | POA: Diagnosis not present

## 2022-06-01 DIAGNOSIS — M8580 Other specified disorders of bone density and structure, unspecified site: Secondary | ICD-10-CM | POA: Diagnosis not present

## 2022-06-01 DIAGNOSIS — J3089 Other allergic rhinitis: Secondary | ICD-10-CM | POA: Diagnosis not present

## 2022-06-01 DIAGNOSIS — D649 Anemia, unspecified: Secondary | ICD-10-CM | POA: Diagnosis not present

## 2022-06-01 DIAGNOSIS — I498 Other specified cardiac arrhythmias: Secondary | ICD-10-CM | POA: Diagnosis not present

## 2022-06-01 DIAGNOSIS — K219 Gastro-esophageal reflux disease without esophagitis: Secondary | ICD-10-CM | POA: Diagnosis not present

## 2022-06-01 DIAGNOSIS — K5790 Diverticulosis of intestine, part unspecified, without perforation or abscess without bleeding: Secondary | ICD-10-CM | POA: Diagnosis not present

## 2022-06-01 DIAGNOSIS — M1990 Unspecified osteoarthritis, unspecified site: Secondary | ICD-10-CM | POA: Diagnosis not present

## 2022-06-01 DIAGNOSIS — I4891 Unspecified atrial fibrillation: Secondary | ICD-10-CM | POA: Diagnosis not present

## 2022-06-01 DIAGNOSIS — H1010 Acute atopic conjunctivitis, unspecified eye: Secondary | ICD-10-CM | POA: Diagnosis not present

## 2022-06-01 DIAGNOSIS — Z471 Aftercare following joint replacement surgery: Secondary | ICD-10-CM | POA: Diagnosis not present

## 2022-06-01 DIAGNOSIS — Z96651 Presence of right artificial knee joint: Secondary | ICD-10-CM | POA: Diagnosis not present

## 2022-06-01 DIAGNOSIS — J4540 Moderate persistent asthma, uncomplicated: Secondary | ICD-10-CM | POA: Diagnosis not present

## 2022-06-01 NOTE — Telephone Encounter (Signed)
7 day Ortho bundle call completed for 05/25/22.

## 2022-06-01 NOTE — Therapy (Signed)
OUTPATIENT PHYSICAL THERAPY LOWER EXTREMITY EVALUATION   Patient Name: [name] MRN: [id number] DOB:01-08-1956, 67 y.o., female Today's Date: 06/04/2022   END OF SESSION:  PT End of Session - 06/04/22 1312     Visit Number 1    Number of Visits 6    Date for PT Re-Evaluation 07/16/22    Authorization Type [employer] Medicare    PT Start Time 1313    PT Stop Time 1410    PT Time Calculation (min) 57 min    Activity Tolerance Patient tolerated treatment well    Behavior During Therapy WFL for tasks assessed/performed             Past Medical History:  Diagnosis Date   Anemia    Angio-edema    Anxiety    Arthritis    Asthma    Atrial fibrillation    Atrial tachycardia    Autonomic dysfunction    Complication of anesthesia    pt states wakes up with "shakes"   CVA (cerebral infarction)    2012 with dizziness and vision change felt embolic from atrial tachy   Disorder of bone and cartilage, unspecified    Diverticulosis    DM (diabetes mellitus)    DVT (deep venous thrombosis)    Eczema    Family history of anesthesia complication    PONV and " shaking "   Fatty liver    GERD (gastroesophageal reflux disease)    History of hiatal hernia    History of radiation therapy 10/24/2012   brain   HLD (hyperlipidemia)    HTN (hypertension)    Hypoglycemia, unspecified    Liver metastasis    Lung metastasis    Metastasis to brain    Osteopenia    Other nonspecific abnormal serum enzyme levels    Other specified congenital anomalies of nervous system    Pacemaker    autonomic dysfunction   Pancreatitis    from therapy   Peripheral vascular disease    POTS (postural orthostatic tachycardia syndrome)    Raynaud's disease    due to chemo   Skin cancer    Hx: of lung lesion   Past Surgical History:  Procedure Laterality Date   ABLATION SAPHENOUS VEIN W/ RFA     2002 x3   CARPAL TUNNEL RELEASE Left 2000   CHOLECYSTECTOMY N/A 03/28/2018   Procedure:  LAPAROSCOPIC CHOLECYSTECTOMY WITH INTRAOPERATIVE CHOLANGIOGRAM;  Surgeon: Luretha Murphy, MD;  Location: [facility] OR;  Service: General;  Laterality: N/A;   CRANIOTOMY N/A 09/30/2012   suboccipital craniectomy   CRANIOTOMY Left 02/09/2013   Procedure: LEFT PARIETAL CRANIOTOMY with stealth;  Surgeon: Barnett Abu, MD;  Location: [facility];  Service: Neurosurgery;  Laterality: Left;  LEFT Parietal Craniotomy for tumor with stealth   DEEP AXILLARY SENTINEL NODE BIOPSY / EXCISION     due to extensive Melanoma-right arm   fatty tumor removed  2000   from chest   INSERT / REPLACE / REMOVE PACEMAKER  2012   1999, x 3   LAPAROSCOPY  09/06/2011   Procedure: LAPAROSCOPY OPERATIVE;  Surgeon: Tresa Endo A. Ernestina Penna, MD;  Location: [facility];  Service: Gynecology;  Laterality: Left;  with Left Ovarian Cystectomy    MELANOMA EXCISION     with removal of lymph nodes, left shoulder   NOSE SURGERY  2010, 2012   for nose bleeds x 2   SINOSCOPY     TONSILLECTOMY AND ADENOIDECTOMY     TOTAL HIP ARTHROPLASTY Right 05/18/2022  Procedure: RIGHT TOTAL HIP ARTHROPLASTY ANTERIOR APPROACH;  Surgeon: Kathryne HitchBlackman, Christopher Y, MD;  Location: [facility] ORS;  Service: Orthopedics;  Laterality: Right;   Patient Active Problem List   Diagnosis Date Noted   Status post total replacement of right hip 05/18/2022   Avascular necrosis of bone of right hip 04/19/2022   Moderate persistent asthma 12/08/2018   Perennial allergic rhinitis with predominantly nonallergic component 12/08/2018   Allergic conjunctivitis 12/08/2018   History of epistaxis 12/08/2018   Atopic dermatitis 12/08/2018   Recurrent urticaria 12/08/2018   Skeeter syndrome 12/08/2018   Skin lesion of chest wall 07/22/2018   S/P laparoscopic cholecystectomy 03/28/2018   Dehydration 05/18/2016   Type 2 diabetes mellitus without complication, without long-term current use of insulin 11/01/2015   History of pacemaker    POTS (postural orthostatic tachycardia syndrome)     Iron deficiency anemia 11/11/2014   Chronic colitis 08/28/2013   Malignant melanoma, metastatic 02/09/2013   Malignant melanoma 12/01/2012   Brain metastasis 10/16/2012   Hypertension 09/30/2012   Anxiety state, unspecified 09/30/2012   Hypoxemia 09/30/2012   Disorder of brain 09/08/2012   Dysautonomia 02/25/2011   Anticoagulant long-term use 01/19/2011   Neuritis 01/19/2011   Skin change 01/19/2011   CVA (cerebral infarction)    Palpitation 06/28/2010   Pacemaker-Biotronik 06/28/2010   Dizziness and giddiness 04/27/2010   DIVERTICULOSIS, COLON, HX OF 01/19/2009   CAROTID SINUS SYNDROME 11/17/2008   OTHER MALAISE AND FATIGUE 11/17/2008   HYPERTENSION, BENIGN 11/02/2008   ATRIAL TACHYCARDIA 09/16/2008   SYNCOPE, HX OF 08/09/2008   INJURY UNSPEC NERVE SHOULDER GIRDLE&UPPER LIMB 07/12/2008   PERSONAL HISTORY OF MALIGNANT MELANOMA OF SKIN 07/12/2008   LUQ PAIN 12/08/2007   NECK PAIN 09/26/2007   NUMBNESS, ARM 09/26/2007   CPK, ABNORMAL 09/26/2007   ARM PAIN, LEFT 06/25/2007   SWELLING OF LIMB 06/25/2007   OSTEOPENIA 06/25/2007    PCP: Madelin HeadingsPanosh, Wanda K, MD   REFERRING PROVIDER: Kathryne HitchBlackman, Christopher Y, MD  REFERRING DIAG:  [id number] (ICD-10-CM) - Status post total replacement of right hip  M87.051 (ICD-10-CM) - Avascular necrosis of bone of right hip   THERAPY DIAG:  Stiffness of right hip, not elsewhere classified  Acute pain of right knee  Stiffness of right knee, not elsewhere classified  Other abnormalities of gait and mobility  Unsteadiness on feet  RATIONALE FOR EVALUATION AND TREATMENT: Rehabilitation  ONSET DATE: 05/18/2022 - R THA, anterior approach    NEXT MD VISIT: 06/28/2022   SUBJECTIVE:  SUBJECTIVE STATEMENT: Pt reports her hip has been doing well but  notes pain in her R knee along with swelling. Makes getting comfortable at night a challenge. Also notes limitations with her balance. Walking a total of a mile per day increments. Limited standing in the shower - has to use her bench for part of her shower.  S/P Right total hip replacement on 05/19/22. Has completed HHPT, but would like a few visits to help her transition safely from walker to cane. Total of 5 visits max for this per Dr. Magnus Ivan.   PAIN: Are you having pain? Yes: NPRS scale: 6/10 Pain location: R patella and lateral knee Pain description: stiff and sore Aggravating factors: any movement Relieving factors: ice, Motrin  PERTINENT HISTORY:  PMH includes but is not limited to: anxiety, asthma, DM-II, HTN, CVA, DVT, POTS, pacemaker, malignant melanoma with brain, liver and lung metastasis s/p radiation and L parietal craniotomy, osteopenia, diverticulitis, GERD, and Raynaud's disease.   PRECAUTIONS: ICD/Pacemaker and Other: POTS  WEIGHT BEARING RESTRICTIONS: No  FALLS:  Has patient fallen in last 6 months? No  LIVING ENVIRONMENT: Lives with: lives with their spouse Lives in: House/apartment Stairs: Yes: Internal: 13 steps; on right going up, on left going up, and can reach both Has following equipment at home: Single point cane, Walker - 2 wheeled, and shower chair  OCCUPATION: Medically disabled from her cancer  PLOF: Independent and Leisure: walking 3-5 miles, 3-4x/wk (power walking with weights), conditioning with weights  PATIENT GOALS: "Walk out of here normally with no pain and back to normal activity."   OBJECTIVE: (objective measures completed at initial evaluation unless otherwise dated)  DIAGNOSTIC FINDINGS:  05/18/22 - R hip and pelvis x-rays: Status post right hip arthroplasty without evidence of complication.  PATIENT SURVEYS:  LEFS 22 / 80 = 27.5 %  COGNITION: Overall cognitive status: Within functional limits for tasks  assessed    SENSATION: Neuropathy in B feet, Raynaud's syndrome  EDEMA:  Mild edema R knee and distal thigh  MUSCLE LENGTH: Hamstrings: mild tight R>L ITB: mod tight R Piriformis: mod tight R Hip flexors: mod tight R Quads: mod tight R Heelcord: NT  POSTURE:  weight shift left and leg length discrepancy with R LE longer than L  PALPATION: TTP over distal lateral R quads  LOWER EXTREMITY ROM: R hip limited in all directions Active ROM Right eval Left eval  Hip flexion    Hip extension    Hip abduction    Hip adduction    Hip internal rotation    Hip external rotation    Knee flexion    Knee extension -1 0  Ankle dorsiflexion    Ankle plantarflexion    Ankle inversion    Ankle eversion    (Blank rows = not tested)  LOWER EXTREMITY MMT:  MMT Right eval Left eval  Hip flexion 3+ 4-  Hip extension 4- 4-  Hip abduction 4- 4  Hip adduction 4 4  Hip internal rotation 4+ 4+  Hip external rotation 2+ 4  Knee flexion 5 5  Knee extension 4+ 5  Ankle dorsiflexion 5 5  Ankle plantarflexion 5 5  Ankle inversion    Ankle eversion     (Blank rows = not tested)  LOWER EXTREMITY SPECIAL TESTS:  Hip special tests: Ober's test: positive   FUNCTIONAL TESTS:  5 times sit to stand: 20.79 sec (knees together) Timed up and go (TUG): 11.66 sec 10 meter walk test: 10.31 sec w/o AD,  10.93 sec with SPC Functional gait assessment: 16/30, < 19 = high risk fall   GAIT: Distance walked: 47ft w/ SPC and w/o AD Assistive device utilized: Single point cane and None Level of assistance: Modified independence and SBA Gait pattern: step through pattern, decreased stride length, decreased hip/knee flexion- Right, antalgic, and trendelenburg Comments: limp more pronounced w/o AD  STAIRS: Level of Assistance: SBA Stair Negotiation Technique: Step to Pattern with Single Rail on Right Number of Stairs: 14 x 2  Height of Stairs: 7  Comments: Patient defaulting to step to pattern as  training by home health PT, but able to perform 1 flight with reciprocal pattern and SBA of PT   TODAY'S TREATMENT:   06/04/22 Initial eval THERAPEUTIC EXERCISE: to improve flexibility, strength and mobility.  Verbal and tactile cues throughout for technique.  R Hooklying HS stretch with strap  x 30" R Supine ITB stretch with strap x 30"   STS x 5 - cues to keep neutral hip, knee and foot alignment, avoiding knees coming together  PATIENT EDUCATION:  Education details: PT eval findings, anticipated POC, and initial HEP Person educated: Patient and Spouse Education method: Explanation, Demonstration, Verbal cues, and Handouts Education comprehension: verbalized understanding, returned demonstration, verbal cues required, and needs further education  HOME EXERCISE PROGRAM: Access Code: [email or web address] URL: [email or web address] Date: 06/04/2022 Prepared by: Glenetta Hew  Exercises - Hooklying Hamstring Stretch with Strap  - 2 x daily - 7 x weekly - 3 reps - 30 sec hold - Supine Iliotibial Band Stretch with Strap  - 2 x daily - 7 x weekly - 3 reps - 30 sec hold - Sit to Stand  - 2 x daily - 7 x weekly - 2 sets - 10 reps - 3 sec hold   ASSESSMENT:  CLINICAL IMPRESSION: [name] is a 67 y.o. female  who was seen today for physical therapy evaluation and treatment s/p R THA on 05/18/22 for PT to work on helping with transition from RW to [facility] and eventually to wean from AD.  She reports minimal to no pain at the R hip but has been having increased R knee pain since surgery which MD attributes to positioning necessary during surgery.  She also has a surgically related leg length discrepancy with the R leg currently longer than the L knee due to reported difficulty advancing the femoral component into the bone.  Current deficits include pain as above, limited R hip ROM in all planes decreased proximal LE flexibility, proximal LE weakness, and impaired balance with high risk for  falls per 5xSTS time and FGA score of 16/30.  [name] will benefit from skilled PT to address above deficits to improve mobility and activity tolerance with decreased pain interference.   OBJECTIVE IMPAIRMENTS: Abnormal gait, decreased activity tolerance, decreased balance, decreased coordination, decreased endurance, decreased knowledge of condition, decreased knowledge of use of DME, decreased mobility, difficulty walking, decreased ROM, decreased strength, decreased safety awareness, increased fascial restrictions, impaired perceived functional ability, increased muscle spasms, impaired flexibility, improper body mechanics, postural dysfunction, and pain.   ACTIVITY LIMITATIONS: carrying, lifting, bending, sitting, standing, squatting, sleeping, stairs, transfers, bed mobility, bathing, and locomotion level  PARTICIPATION LIMITATIONS: meal prep, cleaning, laundry, driving, shopping, and community activity  PERSONAL FACTORS: Past/current experiences, Time since onset of injury/illness/exacerbation, and 3+ comorbidities: anxiety, asthma, DM-II, HTN, CVA, DVT, POTS, pacemaker, malignant melanoma with brain, liver and lung metastasis s/p radiation and L parietal craniotomy, osteopenia, diverticulitis, GERD, and Raynaud's disease  are  also affecting patient's functional outcome.   REHAB POTENTIAL: Good  CLINICAL DECISION MAKING: Evolving/moderate complexity  EVALUATION COMPLEXITY: Moderate   GOALS: Goals reviewed with patient? Yes  SHORT TERM GOALS: Target date: 06/25/2022   Patient will be independent with initial HEP. Baseline: Patient currently performing HH PT HEP Goal status: INITIAL  2.  Patient will improve 5x STS time to </= 15 seconds for improved efficiency and safety with transfers Baseline: 20.79 sec Goal status: INITIAL  LONG TERM GOALS: Target date: 07/16/2022   Patient will be independent with advanced/ongoing HEP to improve outcomes and carryover.  Baseline:  Goal status:  INITIAL  2.  Patient will report at least 50-75% improvement in R hip and knee pain to improve QOL. Baseline:  Goal status: INITIAL  3.  Patient will demonstrate improved proximal knee LE strength to >/= 4+/5 for improved stability and ease of mobility. Baseline: Refer to above MMT table Goal status: INITIAL  4.  Patient will be able to ambulate 600' with or w/o SPC and normal gait pattern without increased pain to access community.  Baseline:  Goal status: INITIAL  5.  Patient will report >/= 31/80 on LEFS to demonstrate improved functional ability. Baseline: 22 / 80 = 27.5 % Goal status: INITIAL  6.  Patient will demonstrate at least 20/24 on FGA to decrease risk of falls. Baseline: 16/30 Goal status: INITIAL    PLAN:  PT FREQUENCY: 1-2x/week (Total of 5 visits max for this per Dr. Magnus Ivan)   PT DURATION: 6 weeks  PLANNED INTERVENTIONS: Therapeutic exercises, Therapeutic activity, Neuromuscular re-education, Balance training, Gait training, Patient/Family education, Self Care, Joint mobilization, Stair training, DME instructions, Dry Needling, Electrical stimulation, Cryotherapy, Moist heat, Taping, Vasopneumatic device, Ultrasound, Ionotophoresis 4mg /ml Dexamethasone, Manual therapy, and Re-evaluation  PLAN FOR NEXT SESSION: Review initial HEP adding stretches as indicated to address remaining tightness; progressed hip strengthening with regular HEP updates due to limited visits; balance and dynamic gait training to reduce fall risk; MT +/- DN to address abnormal muscle tension and pain   [name], PT 06/04/2022, 3:18 PM

## 2022-06-01 NOTE — Telephone Encounter (Signed)
Received call from Lynnette Caffey (PT) with [facility] advised patient is being discharged from HHPT due to being referred to Outpt PT on Monday from the [facility] office. The number to contact Victorino Dike if needed is [phone]

## 2022-06-04 ENCOUNTER — Encounter: Payer: Self-pay | Admitting: Physical Therapy

## 2022-06-04 ENCOUNTER — Ambulatory Visit: Payer: Medicare Other | Attending: Orthopaedic Surgery | Admitting: Physical Therapy

## 2022-06-04 ENCOUNTER — Other Ambulatory Visit: Payer: Self-pay

## 2022-06-04 DIAGNOSIS — M87051 Idiopathic aseptic necrosis of right femur: Secondary | ICD-10-CM | POA: Diagnosis not present

## 2022-06-04 DIAGNOSIS — Z96641 Presence of right artificial hip joint: Secondary | ICD-10-CM | POA: Insufficient documentation

## 2022-06-04 DIAGNOSIS — M25661 Stiffness of right knee, not elsewhere classified: Secondary | ICD-10-CM

## 2022-06-04 DIAGNOSIS — R2681 Unsteadiness on feet: Secondary | ICD-10-CM | POA: Diagnosis not present

## 2022-06-04 DIAGNOSIS — M25651 Stiffness of right hip, not elsewhere classified: Secondary | ICD-10-CM | POA: Diagnosis not present

## 2022-06-04 DIAGNOSIS — R2689 Other abnormalities of gait and mobility: Secondary | ICD-10-CM

## 2022-06-04 DIAGNOSIS — M25561 Pain in right knee: Secondary | ICD-10-CM

## 2022-06-05 ENCOUNTER — Telehealth: Payer: Self-pay | Admitting: *Deleted

## 2022-06-05 NOTE — Telephone Encounter (Signed)
Patient called and asked about her Right knee that is still really bothering her since surgery. Again discussed that the position during surgery usually contributes to this and it improves with time. She mentioned she has a knot on the side of the knee with some possible fluid and she has occasionally had a flare of bursitis, which improves with a steroid shot. She is doing quite a bit for someone 3 weeks post op and has walked up to 3 miles this week. I told her the knee may just need some rest to heal, but that I'd ask you if there was anything different we could do. She has stopped her Aspirin and taking the Ibuprofen prescribed at last visit, which does help. Any other recommendations? Thank you.

## 2022-06-06 NOTE — Telephone Encounter (Signed)
Spoke with patient and updated on response from Dr. Magnus Ivan. She will try Voltaren and then call us the end of the week or beginning of next week if the knee is no better so she can be seen.

## 2022-06-07 ENCOUNTER — Telehealth: Payer: Self-pay

## 2022-06-07 NOTE — Progress Notes (Signed)
Care Management & Coordination Services Pharmacy Note  06/08/2022 Name:  [name] MRN:  [id number] DOB:  [birth year]  Summary: LDL not at goal <100 Last DEXA performed in 2021 showed treatment not necessary, due for new scan  Recommendations/Changes made from today's visit: -ORDER updated lipid panel at next PCP OV, patient declines to schedule at this time -ORDER updated dexa scan, patient defers to discuss until hip has healed from recent surgery  Follow up plan: 6 weeks to follow up on hip healing Pharmacist visit in 6 months   Subjective: [name] is an 67 y.o. year old female who is a primary patient of Panosh, Neta Mends, MD.  The care coordination team was consulted for assistance with disease management and care coordination needs.    Engaged with patient by telephone for follow up visit.  Recent office visits: 03/15/22 Kriste Basque, DO - For AWV, video visit. No med changes  01/17/2022 Berniece Andreas MD - Patient was seen for preventive health examination and additional concerns. Started Cephalexin. Follow up in 1 year.   Recent consult visits: 4/8//24 Glenetta Hew, PT - For hip replacement rehab, no med changes  05/31/22 Doneen Poisson, MD (Ortho) - Post right hip replacement. START ibuprofen  q8h prn  05/17/22 Royston Bake, MD (Cardio) - For dysautonomia, no med changes  01/24/2022 Durene Romans MD(orthopedic) - Patient was seen for pain in right hip joint. No additional chart notes.    01/11/2022 Roland Rack MD (oncology) - Patient was seen for Melanoma metastatic to liver. No medication changes. No follow up noted.  Hospital visits: 05/18/22 [facility] - For right hip replacement, LOS 1 Day. START aspirin, methocarbamol, oxycodone. STOP motrin, meloxicam   Objective:  Lab Results  Component Value Date   CREATININE 0.58 05/19/2022   BUN 13 05/19/2022   GFR 85.78 05/24/2011   EGFR 74 (L) 05/27/2015   GFRNONAA >60  05/19/2022   GFRAA >60 05/19/2019   NA 140 05/19/2022   K 3.9 05/19/2022   CALCIUM 8.6 (L) 05/19/2022   CO2 24 05/19/2022   GLUCOSE 112 (H) 05/19/2022    Lab Results  Component Value Date/Time   HGBA1C 5.7 (H) 05/08/2022 01:16 PM   HGBA1C 5.0 05/04/2021 01:07 PM   GFR 85.78 05/24/2011 11:08 AM   GFR 65.45 10/16/2010 02:11 PM    Last diabetic Eye exam: No results found for: "HMDIABEYEEXA"  Last diabetic Foot exam: No results found for: "HMDIABFOOTEX"   Lab Results  Component Value Date   CHOL 220 (H) 07/07/2021   HDL 55 07/07/2021   LDLCALC 133 (H) 07/07/2021   LDLDIRECT 129 (H) 07/07/2021   TRIG 179 (H) 07/07/2021   CHOLHDL 4.0 07/07/2021       Latest Ref Rng & Units 05/08/2022    1:16 PM 08/09/2020   10:05 AM 05/27/2015    9:24 AM  Hepatic Function  Total Protein 6.5 - 8.1 g/dL 7.5  7.4  8.0   Albumin 3.5 - 5.0 g/dL 4.4  4.2  4.4   AST 15 - 41 U/L ALT 0 - 44 U/L 22  27  33   Alk Phosphatase 38 - 126 U/L 89  120  127   Total Bilirubin 0.3 - 1.2 mg/dL 0.6  0.3  4.09   Bilirubin, Direct 0.0 - 0.3 mg/dL  0.0      Lab Results  Component Value Date/Time   TSH 1.146 12/17/2013 01:09 PM  TSH 0.318 (L) 08/28/2013 05:40 PM   TSH 0.82 05/01/2007 11:38 AM       Latest Ref Rng & Units 05/19/2022    3:34 AM 05/08/2022    1:16 PM 05/04/2021    1:07 PM  CBC  WBC 4.0 - 10.5 K/uL 11.2  5.2  7.3   Hemoglobin 12.0 - 15.0 g/dL 16.112.1  09.614.1  04.513.8   Hematocrit 36.0 - 46.0 % 36.6  43.3  41.8   Platelets 150 - 400 K/uL 216  241  285     Lab Results  Component Value Date/Time   VD25OH 24 (L) 10/24/2009 08:23 PM   VD25OH 24 (L) 11/17/2008 07:53 PM   VITAMINB12 522 10/24/2009 03:55 PM   VITAMINB12 340 11/17/2008 03:13 PM    Clinical ASCVD: Yes  The ASCVD Risk score (Arnett DK, et al., 2019) failed to calculate for the following reasons:   The patient has a prior MI or stroke diagnosis       03/15/2022   11:20 AM 03/14/2021    1:09 PM 03/22/2020   11:39 AM   Depression screen PHQ 2/9  Decreased Interest 0 0 0  Down, Depressed, Hopeless  0 0  PHQ - 2 Score 0 0 0     Social History   Tobacco Use  Smoking Status Never  Smokeless Tobacco Never   BP Readings from Last 3 Encounters:  05/19/22 131/83  05/17/22 110/70  05/08/22 (!) 149/86   Pulse Readings from Last 3 Encounters:  05/19/22 73  05/17/22 69  05/08/22 73   Wt Readings from Last 3 Encounters:  05/18/22 154 lb (69.9 kg)  05/17/22 154 lb (69.9 kg)  05/08/22 152 lb (68.9 kg)   BMI Readings from Last 3 Encounters:  05/18/22 27.28 kg/m  05/17/22 27.28 kg/m  05/08/22 26.93 kg/m    Allergies  Allergen Reactions   Bee Venom Swelling   Doxycycline Hives   Erythromycin Hives   Gadavist [Gadobutrol] Hives, Shortness Of Breath and Itching   Paxlovid [Nirmatrelvir-Ritonavir] Swelling and Other (See Comments)    Mouth swelling, mouth ulcers   Penicillins Hives    Did it involve swelling of the face/tongue/throat, SOB, or low BP? No Did it involve sudden or severe rash/hives, skin peeling, or any reaction on the inside of your mouth or nose? Yes Did you need to seek medical attention at a hospital or doctor's office? Yes When did it last happen?      5+ years If all above answers are "NO", may proceed with cephalosporin use.    Sulfa Antibiotics Hives   Vancomycin Hives, Rash and Other (See Comments)    Develops "Red Man" Syndrome and welts/hives if it is run too quickly via IV- must be run slowly   Preparation H [Pramox-Pe-Glycerin-Petrolatum] Hives    Cannot use  Cream or Suppositories    Phenergan [Promethazine Hcl] Other (See Comments)    Causes restless leg syndrome    Medications Reviewed Today     Reviewed by Sherrill RaringHerring, Bernise Sylvain H, RPH (Pharmacist) on 06/08/22 at 1417  Med List Status: <None>   Medication Order Taking? Sig Documenting Provider Last Dose Status Informant  acetaminophen (TYLENOL) 500 MG tablet [id number] No Take 500 mg by mouth See admin  instructions. Take 500 mg by mouth at bedtime and an additional 500 mg three times a day as needed for pain [provider] Taking Active Self           Med Note (MENDOZA MENDEZ, CARLOS A  Tue May 31, 2020  2:15 PM)    albuterol (PROVENTIL) (2.5 MG/3ML) 0.083% nebulizer solution [id number] No Take 3 mLs (2.5 mg total) by nebulization every 4 (four) hours as needed for wheezing or shortness of breath. Bobbitt, Heywood Iles, MD Taking Active Self  albuterol (VENTOLIN HFA) 108 (90 Base) MCG/ACT inhaler [id number] No INHALE 2 PUFFS INTO THE LUNGS EVERY 4 (FOUR) HOURS AS NEEDED FOR WHEEZING OR SHORTNESS OF BREATH. Panosh, Neta Mends, MD Taking Active Self  Ascorbic Acid (VITAMIN C) 1000 MG tablet [id number] No Take 2,000 mg by mouth daily. [provider] Taking Active Self  aspirin 81 MG chewable tablet [id number] No Chew 1 tablet (81 mg total) by mouth 2 (two) times daily. Kirtland Bouchard, PA-C Taking Active   augmented betamethasone dipropionate (DIPROLENE) 0.05 % ointment [id number] No Apply topically 2 (two) times daily. Limit to no longer than 2 weeks use. Not on face.  Patient taking differently: Apply 1 Application topically 2 (two) times daily as needed (hives). Limit to no longer than 2 weeks use. Not on face.   Panosh, Neta Mends, MD Taking Active Self  benzonatate (TESSALON) 200 MG capsule [id number] No Take 1 capsule (200 mg total) by mouth 2 (two) times daily as needed for cough. Nafziger, Kandee Keen, NP Taking Active Self  budesonide (ENTOCORT EC) 3 MG 24 hr capsule [id number] No Take 3 mg by mouth as needed for rash. [provider] Taking Active Self  calcium carbonate (TUMS EX) 750 MG chewable tablet [id number] No Chew 1 tablet by mouth daily. [provider] Taking Active Self  cephALEXin (KEFLEX) 500 MG capsule [id number] No Take 1 capsule (500 mg total) by mouth 3 (three) times daily. If needed for UTI Panosh, Neta Mends, MD Taking Active Self  clobetasol cream (TEMOVATE)  0.05 % [id number] No Apply 1 application topically 2 (two) times daily as needed (rash). [provider] Taking Active Self  clonazePAM (KLONOPIN) 1 MG tablet [id number] No TAKE 1 TABLET BY MOUTH TWICE A DAY MAY TAKE UP TO 3 TIMES DAILY IF NEEDED FOR EXTRA ANXIETY  Patient taking differently: Take 1 mg by mouth See admin instructions. Take 1 mg by mouth at 8 AM and an additional 1 mg up to two times a day as needed for anxiety   Panosh, Neta Mends, MD Taking Active Self  cloNIDine (CATAPRES) 0.1 MG tablet [id number] No Take 1 tablet (0.1 mg total) by mouth at bedtime.  Patient taking differently: Take 0.1 mg by mouth See admin instructions. Take 0.1 mg by mouth at 8 PM   Duke Salvia, MD Taking Active Self  cyanocobalamin (VITAMIN B12) 1000 MCG/ML injection [id number] No INJECT 1 ML (1,000 MCG TOTAL) INTO THE MUSCLE EVERY 30 DAYS. Panosh, Neta Mends, MD Taking Active Self  Desoximetasone (TOPICORT) 0.25 % ointment [id number] No Apply 1 application topically 2 (two) times daily.  Patient taking differently: Apply 1 application  topically 2 (two) times daily as needed (rash).   Panosh, Neta Mends, MD Taking Active Self  diphenhydrAMINE (BENADRYL) 25 MG tablet [id number] No Take 25-50 mg by mouth daily as needed for allergies. [provider] Taking Active Self  EPINEPHrine 0.3 mg/0.3 mL IJ SOAJ injection [id number] No Inject 0.3 mg into the muscle as needed for anaphylaxis. [provider] Taking Active Self  fexofenadine (ALLEGRA) 180 MG tablet [id number] No Take 180 mg by mouth daily as needed for allergies or rhinitis. [provider] Taking Active Self  fluticasone (FLONASE) 50 MCG/ACT nasal spray  [id number] No Place 2 sprays into both nostrils daily.  Patient taking differently: Place 2 sprays into both nostrils daily as needed for allergies.   Leath-Warren, Sadie Haber, NP Taking Active Self  HYDROcodone-acetaminophen (NORCO/VICODIN) 5-325 MG tablet [id number] No Take 1-2  tablets by mouth every 6 (six) hours as needed for moderate pain. Kathryne Hitch, MD Taking Active   hydrocortisone (ANUSOL-HC) 25 MG suppository [id number] No Place 25 mg rectally as needed for hemorrhoids. [provider] Taking Active Self  ibuprofen (ADVIL) 800 MG tablet [id number] No Take 1 tablet (800 mg total) by mouth every 8 (eight) hours as needed. Kathryne Hitch, MD Taking Active   labetalol (NORMODYNE) 100 MG tablet [id number] No Take 0.5 tablets (50 mg total) by mouth 2 (two) times daily. Duke Salvia, MD Taking Active Self  levETIRAcetam (KEPPRA) 250 MG tablet [id number] No Take 250 mg by mouth See admin instructions. Take 250 mg by mouth at 8 AM, 2 PM, and 8 PM [provider] Taking Active Self           Med Note Henreitta Leber, JACQUELINE L   Tue Jun 27, 2021  3:16 PM)    levocetirizine (XYZAL) 5 MG tablet [id number] No TAKE 1 TABLET BY MOUTH EVERY DAY AS NEEDED Bobbitt, Heywood Iles, MD Taking Active Self  lidocaine (XYLOCAINE) 2 % solution [id number] No 5mL swish and swallow as needed for sore throat; take with nystatin susp Margaretann Loveless, PA-C Taking Active Self  methocarbamol (ROBAXIN) 500 MG tablet [id number] No Take 1 tablet (500 mg total) by mouth every 6 (six) hours as needed for muscle spasms. Kirtland Bouchard, PA-C Taking Active   metoprolol tartrate (LOPRESSOR) 25 MG tablet [id number] No Take 25 mg by mouth See admin instructions. Take 25 mg by mouth at 8 AM and 2 PM [provider] Taking Active Self  nystatin (MYCOSTATIN) 100000 UNIT/ML suspension [id number] No Use as directed 5 mLs (500,000 Units total) in the mouth or throat 3 (three) times daily as needed (thrush).  Patient taking differently: Use as directed 3 mLs in the mouth or throat 3 (three) times daily as needed (thrush).   Panosh, Neta Mends, MD Taking Active Self  Olopatadine HCl (PATADAY) 0.2 % SOLN [id number] No Use one drop in each eye once daily as needed.  Patient taking  differently: Place 1 drop into both eyes daily as needed (itchy eyes).   Bobbitt, Heywood Iles, MD Taking Active Self  ondansetron (ZOFRAN-ODT) 4 MG disintegrating tablet [id number] No Take 4 mg by mouth every 8 (eight) hours as needed for nausea/vomiting. [provider] Taking Active Self  oxyCODONE (OXY IR/ROXICODONE) 5 MG immediate release tablet [id number] No Take 1-2 tablets (5-10 mg total) by mouth every 4 (four) hours as needed for moderate pain (pain score 4-6). Kirtland Bouchard, PA-C Taking Active   polyethylene glycol [facility] / GLYCOLAX) packet [id number] No Take 17 g by mouth at bedtime. [provider] Taking Active Self  potassium chloride SA (KLOR-CON M) 20 MEQ tablet [id number] No Take 1 tablet (20 mEq total) by mouth 2 (two) times daily.  Patient taking differently: Take 20 mEq by mouth See admin instructions. Take 20 mEq by mouth at 8 AM and 2 PM   Duke Salvia, MD Taking Active Self  Probiotic Product (PROBIOTIC PO) [id number] No Take 2 capsules by mouth daily. [provider] Taking Active Self  prochlorperazine (COMPAZINE) 10 MG tablet [id number] No Take 1 tablet (10 mg total) by mouth  every 6 (six) hours as needed for nausea or vomiting. Nelwyn Salisbury, MD Taking Active Self  sodium chloride (OCEAN) 0.65 % SOLN nasal spray [id number] No Place 1 spray into both nostrils 2 (two) times daily. [provider] Taking Active Self  Spacer/Aero-Holding Deretha [facility] DIAMOND) MISC [id number] No See admin instructions. [provider] Taking Active Self  Syringe/Needle, Disp, (SYRINGE 3CC/27GX1-1/4") 27G X 1-1/4" 3 ML MISC [id number] No Inject 1 mL into the muscle every 30 (thirty) days. Vit B12 Panosh, Neta Mends, MD Taking Active Self  tiZANidine (ZANAFLEX) 2 MG tablet [id number] No Take 1 tablet (2 mg total) by mouth every 6 (six) hours as needed for muscle spasms. Kathryne Hitch, MD Taking Active   Vitamin D, Ergocalciferol, (DRISDOL)  50000 units CAPS capsule [id number] No TAKE 1 CAPSULE (50,000 UNITS TOTAL) BY MOUTH EVERY 7 (SEVEN) DAYS.  Patient taking differently: Take 50,000 Units by mouth every Saturday.   Duke Salvia, MD Taking Active Self  Zinc 50 MG TABS [id number] No Take 50 mg by mouth daily. [provider] Taking Active Self            SDOH:  (Social Determinants of Health) assessments and interventions performed: Yes SDOH Interventions    Flowsheet Row Care Coordination from 06/08/2022 in [facility] Care Coordination from 12/27/2021 in [facility] Coordination Chronic Care Management from 11/22/2021 in [facility] [place] Chronic Care Management from 07/22/2019 in [facility] [place]  SDOH Interventions      Food Insecurity Interventions Intervention Not Indicated -- -- --  Housing Interventions Intervention Not Indicated Intervention Not Indicated -- --  Transportation Interventions -- Intervention Not Indicated -- Intervention Not Indicated  Financial Strain Interventions -- -- Intervention Not Indicated --       Medication Assistance: None required.  Patient affirms current coverage meets needs.  Medication Access: Within the past 30 days, how often has patient missed a dose of medication? None Is a pillbox or other method used to improve adherence? Yes  Factors that may affect medication adherence? no barriers identified Are meds synced by current pharmacy? No  Are meds delivered by current pharmacy? No  Does patient experience delays in picking up medications due to transportation concerns? No   [facility] Reviewed: Is patient disadvantaged to use [facility]?: No  Current Rx insurance plan: [facility] Name and location of Current pharmacy:  [id number] - [place], [place] - [address] [place] [address] Phone: [phone] Fax:  [phone]  [id number] - [place], [name] - [address] [place] [address] Phone: [phone] Fax: [phone]  [place] [facility] [address] [name] [address] Phone: [phone] Fax: [phone]  [facility] services reviewed with patient today?: No  Patient requests to transfer care to [facility]?: No  Reason patient declined to change pharmacies: Not mentioned at this visit  Compliance/Adherence/Medication fill history: Care Gaps: AWV - completed 03/15/2022 Last eye exam - never done Last foot exam - never done Last BP - 110/70 on 05/17/2022 Last A1C - 5.7 on 05/08/2022 Tdap - never done Papsmear - never done Mammogram - overdue Hep C Screen - postponed HIV screen - postponed Urine ACR - postponed Shingrix - postponed  Star-Rating Drugs: None   Assessment/Plan   Hyperlipidemia: (LDL goal < 70) -Uncontrolled -Current treatment: None -Medications  previously tried: Crestor, Zetia, Simvastatin -Current dietary patterns: avoids red meat and all fatty, fried foods due to h/x of gallbladder removal -Current exercise habits: limited currently due to hip sx but is going to PT and doing home exercises -Educated on Cholesterol goals;  Importance of limiting foods high in cholesterol; Exercise goal of 150 minutes per week; -Counseled on diet and exercise extensively Recommended updated lipid panel at next OV with PCP, declines scheduling for now  Osteoporosis / Osteopenia (Goal Prevent bone loss and bone fracture) -Not ideally controlled -Last DEXA Scan: 07/2019   T-Score femoral neck: -2.0 -Patient needs updated dexa scan -Current treatment  Vit D 50,000 units once weekly Appropriate, Effective, Safe, Accessible Calcium 500mg  BID Appropriate, Effective, Safe, Accessible -Medications previously tried: None  -Recommend [phone] units of vitamin D daily. Recommend 1200 mg of  calcium daily from dietary and supplemental sources. Recommend weight-bearing and muscle strengthening exercises for building and maintaining bone density. -Recommended to continue current medication Recommended updated DEXA scan, defers to discuss after hip has healed  Sherrill Raring Clinical Pharmacist [phone]

## 2022-06-07 NOTE — Progress Notes (Signed)
Care Management & Coordination Services Pharmacy Team  Reason for Encounter: Appointment Reminder  Contacted patient to confirm telephone appointment with Delano Metz, PharmD on 06/08/2022 at 1:30. Spoke with patient on 06/07/2022   Do you have any problems getting your medications? Patient denies  What is your top health concern you would like to discuss at your upcoming visit? Patient denies  Have you seen any other providers since your last visit with PCP? Patient denies, she did mention that she recently had a total right hip replacement.   Care Gaps: AWV - completed 03/15/2022 Last eye exam - never done Last foot exam - never done Last BP - 110/70 on 05/17/2022 Last A1C - 5.7 on 05/08/2022 Tdap - never done Papsmear - never done Mammogram - overdue Hep C Screen - postponed HIV screen - postponed Urine ACR - postponed Shingrix - postponed  Star Rating Drugs: None  Inetta Fermo [facility]  Clinical Pharmacist Assistant [phone]

## 2022-06-08 ENCOUNTER — Ambulatory Visit: Payer: Medicare Other

## 2022-06-13 ENCOUNTER — Ambulatory Visit: Payer: Medicare Other

## 2022-06-13 ENCOUNTER — Ambulatory Visit (INDEPENDENT_AMBULATORY_CARE_PROVIDER_SITE_OTHER): Payer: Medicare Other | Admitting: Orthopaedic Surgery

## 2022-06-13 DIAGNOSIS — R2689 Other abnormalities of gait and mobility: Secondary | ICD-10-CM

## 2022-06-13 DIAGNOSIS — R2681 Unsteadiness on feet: Secondary | ICD-10-CM | POA: Diagnosis not present

## 2022-06-13 DIAGNOSIS — M25561 Pain in right knee: Secondary | ICD-10-CM | POA: Diagnosis not present

## 2022-06-13 DIAGNOSIS — M25651 Stiffness of right hip, not elsewhere classified: Secondary | ICD-10-CM | POA: Diagnosis not present

## 2022-06-13 DIAGNOSIS — Z96641 Presence of right artificial hip joint: Secondary | ICD-10-CM

## 2022-06-13 DIAGNOSIS — M87051 Idiopathic aseptic necrosis of right femur: Secondary | ICD-10-CM | POA: Diagnosis not present

## 2022-06-13 DIAGNOSIS — M25661 Stiffness of right knee, not elsewhere classified: Secondary | ICD-10-CM

## 2022-06-13 NOTE — Therapy (Signed)
OUTPATIENT PHYSICAL THERAPY TREATMENT   Patient Name: [name] MRN: [id number] DOB:07-26-1955, 67 y.o., female Today's Date: 06/13/2022   END OF SESSION:  PT End of Session - 06/13/22 1025     Visit Number 2    Number of Visits 6    Date for PT Re-Evaluation 07/16/22    Authorization Type [employer] Medicare    PT Start Time 0933    PT Stop Time 1016    PT Time Calculation (min) 43 min    Activity Tolerance Patient tolerated treatment well    Behavior During Therapy WFL for tasks assessed/performed              Past Medical History:  Diagnosis Date   Anemia    Angio-edema    Anxiety    Arthritis    Asthma    Atrial fibrillation    Atrial tachycardia    Autonomic dysfunction    Complication of anesthesia    pt states wakes up with "shakes"   CVA (cerebral infarction)    2012 with dizziness and vision change felt embolic from atrial tachy   Disorder of bone and cartilage, unspecified    Diverticulosis    DM (diabetes mellitus)    DVT (deep venous thrombosis)    Eczema    Family history of anesthesia complication    PONV and " shaking "   Fatty liver    GERD (gastroesophageal reflux disease)    History of hiatal hernia    History of radiation therapy 10/24/2012   brain   HLD (hyperlipidemia)    HTN (hypertension)    Hypoglycemia, unspecified    Liver metastasis    Lung metastasis    Metastasis to brain    Osteopenia    Other nonspecific abnormal serum enzyme levels    Other specified congenital anomalies of nervous system    Pacemaker    autonomic dysfunction   Pancreatitis    from therapy   Peripheral vascular disease    POTS (postural orthostatic tachycardia syndrome)    Raynaud's disease    due to chemo   Skin cancer    Hx: of lung lesion   Past Surgical History:  Procedure Laterality Date   ABLATION SAPHENOUS VEIN W/ RFA     2002 x3   CARPAL TUNNEL RELEASE Left 2000   CHOLECYSTECTOMY N/A 03/28/2018   Procedure: LAPAROSCOPIC  CHOLECYSTECTOMY WITH INTRAOPERATIVE CHOLANGIOGRAM;  Surgeon: Luretha Murphy, MD;  Location: [facility] OR;  Service: General;  Laterality: N/A;   CRANIOTOMY N/A 09/30/2012   suboccipital craniectomy   CRANIOTOMY Left 02/09/2013   Procedure: LEFT PARIETAL CRANIOTOMY with stealth;  Surgeon: Barnett Abu, MD;  Location: [facility];  Service: Neurosurgery;  Laterality: Left;  LEFT Parietal Craniotomy for tumor with stealth   DEEP AXILLARY SENTINEL NODE BIOPSY / EXCISION     due to extensive Melanoma-right arm   fatty tumor removed  2000   from chest   INSERT / REPLACE / REMOVE PACEMAKER  2012   1999, x 3   LAPAROSCOPY  09/06/2011   Procedure: LAPAROSCOPY OPERATIVE;  Surgeon: Tresa Endo A. Ernestina Penna, MD;  Location: [facility] ORS;  Service: Gynecology;  Laterality: Left;  with Left Ovarian Cystectomy    MELANOMA EXCISION     with removal of lymph nodes, left shoulder   NOSE SURGERY  2010, 2012   for nose bleeds x 2   SINOSCOPY     TONSILLECTOMY AND ADENOIDECTOMY     TOTAL HIP ARTHROPLASTY Right 05/18/2022  Procedure: RIGHT TOTAL HIP ARTHROPLASTY ANTERIOR APPROACH;  Surgeon: Kathryne Hitch, MD;  Location: [facility] ORS;  Service: Orthopedics;  Laterality: Right;   Patient Active Problem List   Diagnosis Date Noted   Status post total replacement of right hip 05/18/2022   Avascular necrosis of bone of right hip 04/19/2022   Moderate persistent asthma 12/08/2018   Perennial allergic rhinitis with predominantly nonallergic component 12/08/2018   Allergic conjunctivitis 12/08/2018   History of epistaxis 12/08/2018   Atopic dermatitis 12/08/2018   Recurrent urticaria 12/08/2018   Skeeter syndrome 12/08/2018   Skin lesion of chest wall 07/22/2018   S/P laparoscopic cholecystectomy 03/28/2018   Dehydration 05/18/2016   Type 2 diabetes mellitus without complication, without long-term current use of insulin 11/01/2015   History of pacemaker    POTS (postural orthostatic tachycardia syndrome)    Iron  deficiency anemia 11/11/2014   Chronic colitis 08/28/2013   Malignant melanoma, metastatic 02/09/2013   Malignant melanoma 12/01/2012   Brain metastasis 10/16/2012   Hypertension 09/30/2012   Anxiety state, unspecified 09/30/2012   Hypoxemia 09/30/2012   Disorder of brain 09/08/2012   Dysautonomia 02/25/2011   Anticoagulant long-term use 01/19/2011   Neuritis 01/19/2011   Skin change 01/19/2011   CVA (cerebral infarction)    Palpitation 06/28/2010   Pacemaker-Biotronik 06/28/2010   Dizziness and giddiness 04/27/2010   DIVERTICULOSIS, COLON, HX OF 01/19/2009   CAROTID SINUS SYNDROME 11/17/2008   OTHER MALAISE AND FATIGUE 11/17/2008   HYPERTENSION, BENIGN 11/02/2008   ATRIAL TACHYCARDIA 09/16/2008   SYNCOPE, HX OF 08/09/2008   INJURY UNSPEC NERVE SHOULDER GIRDLE&UPPER LIMB 07/12/2008   PERSONAL HISTORY OF MALIGNANT MELANOMA OF SKIN 07/12/2008   LUQ PAIN 12/08/2007   NECK PAIN 09/26/2007   NUMBNESS, ARM 09/26/2007   CPK, ABNORMAL 09/26/2007   ARM PAIN, LEFT 06/25/2007   SWELLING OF LIMB 06/25/2007   OSTEOPENIA 06/25/2007    PCP: Madelin Headings, MD   REFERRING PROVIDER: Kathryne Hitch, MD  REFERRING DIAG:  [id number] (ICD-10-CM) - Status post total replacement of right hip  M87.051 (ICD-10-CM) - Avascular necrosis of bone of right hip   THERAPY DIAG:  Stiffness of right hip, not elsewhere classified  Acute pain of right knee  Stiffness of right knee, not elsewhere classified  Other abnormalities of gait and mobility  Unsteadiness on feet  RATIONALE FOR EVALUATION AND TREATMENT: Rehabilitation  ONSET DATE: 05/18/2022 - R THA, anterior approach    NEXT MD VISIT: 06/28/2022   SUBJECTIVE:  SUBJECTIVE STATEMENT:  Pt reports having continued R knee pain and  swelling.   S/P Right total hip replacement on 05/19/22. Has completed HHPT, but would like a few visits to help her transition safely from walker to cane. Total of 5 visits max for this per Dr. Magnus Ivan.   PAIN: Are you having pain? Yes: NPRS scale: 6/10 Pain location: R patella and lateral knee Pain description: stiff and sore Aggravating factors: any movement Relieving factors: ice, Motrin  PERTINENT HISTORY:  PMH includes but is not limited to: anxiety, asthma, DM-II, HTN, CVA, DVT, POTS, pacemaker, malignant melanoma with brain, liver and lung metastasis s/p radiation and L parietal craniotomy, osteopenia, diverticulitis, GERD, and Raynaud's disease.   PRECAUTIONS: ICD/Pacemaker and Other: POTS  WEIGHT BEARING RESTRICTIONS: No  FALLS:  Has patient fallen in last 6 months? No  LIVING ENVIRONMENT: Lives with: lives with their spouse Lives in: House/apartment Stairs: Yes: Internal: 13 steps; on right going up, on left going up, and can reach both Has following equipment at home: Single point cane, Walker - 2 wheeled, and shower chair  OCCUPATION: Medically disabled from her cancer  PLOF: Independent and Leisure: walking 3-5 miles, 3-4x/wk (power walking with weights), conditioning with weights  PATIENT GOALS: "Walk out of here normally with no pain and back to normal activity."   OBJECTIVE: (objective measures completed at initial evaluation unless otherwise dated)  DIAGNOSTIC FINDINGS:  05/18/22 - R hip and pelvis x-rays: Status post right hip arthroplasty without evidence of complication.  PATIENT SURVEYS:  LEFS 22 / 80 = 27.5 %  COGNITION: Overall cognitive status: Within functional limits for tasks assessed    SENSATION: Neuropathy in B feet, Raynaud's syndrome  EDEMA:  Mild edema R knee and distal thigh  MUSCLE LENGTH: Hamstrings: mild tight R>L ITB: mod tight R Piriformis: mod tight R Hip flexors: mod tight R Quads: mod tight R Heelcord: NT  POSTURE:   weight shift left and leg length discrepancy with R LE longer than L  PALPATION: TTP over distal lateral R quads  LOWER EXTREMITY ROM: R hip limited in all directions Active ROM Right eval Left eval  Hip flexion    Hip extension    Hip abduction    Hip adduction    Hip internal rotation    Hip external rotation    Knee flexion    Knee extension -1 0  Ankle dorsiflexion    Ankle plantarflexion    Ankle inversion    Ankle eversion    (Blank rows = not tested)  LOWER EXTREMITY MMT:  MMT Right eval Left eval  Hip flexion 3+ 4-  Hip extension 4- 4-  Hip abduction 4- 4  Hip adduction 4 4  Hip internal rotation 4+ 4+  Hip external rotation 2+ 4  Knee flexion 5 5  Knee extension 4+ 5  Ankle dorsiflexion 5 5  Ankle plantarflexion 5 5  Ankle inversion    Ankle eversion     (Blank rows = not tested)  LOWER EXTREMITY SPECIAL TESTS:  Hip special tests: Ober's test: positive   FUNCTIONAL TESTS:  5 times sit to stand: 20.79 sec (knees together) Timed up and go (TUG): 11.66 sec 10 meter walk test: 10.31 sec w/o AD, 10.93 sec with SPC Functional gait assessment: 16/30, < 19 = high risk fall   GAIT: Distance walked: 8ft w/ SPC and w/o AD Assistive device utilized: Single point cane and None Level of assistance: Modified independence and SBA Gait pattern: step through pattern,  decreased stride length, decreased hip/knee flexion- Right, antalgic, and trendelenburg Comments: limp more pronounced w/o AD  STAIRS: Level of Assistance: SBA Stair Negotiation Technique: Step to Pattern with Single Rail on Right Number of Stairs: 14 x 2  Height of Stairs: 7  Comments: Patient defaulting to step to pattern as training by home health PT, but able to perform 1 flight with reciprocal pattern and SBA of PT   TODAY'S TREATMENT:  06/13/22 THERAPEUTIC EXERCISE: to improve flexibility, strength and mobility.  Verbal and tactile cues throughout for technique. Nustep L3x20min R  Hooklying HS stretch with strap  x 30" R Supine ITB stretch with strap x 30"   STS x 10 - good knee alignment Bridge with glute set x 10  Seated heel slide and LAQ x 10 RLE Standing clamshells RTB x 10  Standing hip abduction x 10 RTB at counter     06/04/22 Initial eval THERAPEUTIC EXERCISE: to improve flexibility, strength and mobility.  Verbal and tactile cues throughout for technique.  R Hooklying HS stretch with strap  x 30" R Supine ITB stretch with strap x 30"   STS x 5 - cues to keep neutral hip, knee and foot alignment, avoiding knees coming together  PATIENT EDUCATION:  Education details: PT eval findings, anticipated POC, and initial HEP Person educated: Patient and Spouse Education method: Explanation, Demonstration, Verbal cues, and Handouts Education comprehension: verbalized understanding, returned demonstration, verbal cues required, and needs further education  HOME EXERCISE PROGRAM: Access Code: [email or web address] URL: [email or web address] Date: 06/13/2022 Prepared by: Verta Ellen  Exercises - Hooklying Hamstring Stretch with Strap  - 2 x daily - 7 x weekly - 3 reps - 30 sec hold - Supine Iliotibial Band Stretch with Strap  - 2 x daily - 7 x weekly - 3 reps - 30 sec hold - Sit to Stand  - 2 x daily - 7 x weekly - 2 sets - 10 reps - 3 sec hold - Standing Hamstring Curl with Resistance  - 1 x daily - 7 x weekly - 3 sets - 10 reps - Standing Hip Abduction with Resistance at Ankles and Counter Support  - 1 x daily - 7 x weekly - 3 sets - 10 reps - Seated Heel Slide  - 1 x daily - 7 x weekly - 3 sets - 10 reps   ASSESSMENT:  CLINICAL IMPRESSION: Continued with progression of exercises. Cues provided throughout session to correct form and target the correct muscles. She had c/o R knee pain mostly today. Cues to avoid R valgus collapse with gait and step ups. Patient would continue to benefit from skilled therapy.   OBJECTIVE IMPAIRMENTS: Abnormal gait,  decreased activity tolerance, decreased balance, decreased coordination, decreased endurance, decreased knowledge of condition, decreased knowledge of use of DME, decreased mobility, difficulty walking, decreased ROM, decreased strength, decreased safety awareness, increased fascial restrictions, impaired perceived functional ability, increased muscle spasms, impaired flexibility, improper body mechanics, postural dysfunction, and pain.   ACTIVITY LIMITATIONS: carrying, lifting, bending, sitting, standing, squatting, sleeping, stairs, transfers, bed mobility, bathing, and locomotion level  PARTICIPATION LIMITATIONS: meal prep, cleaning, laundry, driving, shopping, and community activity  PERSONAL FACTORS: Past/current experiences, Time since onset of injury/illness/exacerbation, and 3+ comorbidities: anxiety, asthma, DM-II, HTN, CVA, DVT, POTS, pacemaker, malignant melanoma with brain, liver and lung metastasis s/p radiation and L parietal craniotomy, osteopenia, diverticulitis, GERD, and Raynaud's disease  are also affecting patient's functional outcome.   REHAB POTENTIAL: Good  CLINICAL DECISION MAKING: Evolving/moderate complexity  EVALUATION  COMPLEXITY: Moderate   GOALS: Goals reviewed with patient? Yes  SHORT TERM GOALS: Target date: 06/25/2022   Patient will be independent with initial HEP. Baseline: Patient currently performing HH PT HEP Goal status: INITIAL  2.  Patient will improve 5x STS time to </= 15 seconds for improved efficiency and safety with transfers Baseline: 20.79 sec Goal status: INITIAL  LONG TERM GOALS: Target date: 07/16/2022   Patient will be independent with advanced/ongoing HEP to improve outcomes and carryover.  Baseline:  Goal status: INITIAL  2.  Patient will report at least 50-75% improvement in R hip and knee pain to improve QOL. Baseline:  Goal status: INITIAL  3.  Patient will demonstrate improved proximal knee LE strength to >/= 4+/5 for  improved stability and ease of mobility. Baseline: Refer to above MMT table Goal status: INITIAL  4.  Patient will be able to ambulate 600' with or w/o SPC and normal gait pattern without increased pain to access community.  Baseline:  Goal status: INITIAL  5.  Patient will report >/= 31/80 on LEFS to demonstrate improved functional ability. Baseline: 22 / 80 = 27.5 % Goal status: INITIAL  6.  Patient will demonstrate at least 20/24 on FGA to decrease risk of falls. Baseline: 16/30 Goal status: INITIAL    PLAN:  PT FREQUENCY: 1-2x/week (Total of 5 visits max for this per Dr. Magnus Ivan)   PT DURATION: 6 weeks  PLANNED INTERVENTIONS: Therapeutic exercises, Therapeutic activity, Neuromuscular re-education, Balance training, Gait training, Patient/Family education, Self Care, Joint mobilization, Stair training, DME instructions, Dry Needling, Electrical stimulation, Cryotherapy, Moist heat, Taping, Vasopneumatic device, Ultrasound, Ionotophoresis 4mg /ml Dexamethasone, Manual therapy, and Re-evaluation  PLAN FOR NEXT SESSION: Review initial HEP adding stretches as indicated to address remaining tightness; progressed hip strengthening with regular HEP updates due to limited visits; balance and dynamic gait training to reduce fall risk; MT +/- DN to address abnormal muscle tension and pain   Teana Lindahl L Arlander Gillen, PTA 06/13/2022, 1:08 PM

## 2022-06-13 NOTE — Progress Notes (Signed)
The patient comes in today with continued right knee pain after having a right hip replacement last month.  This is mainly from the stress that we put on a knee when more putting in the femoral component.  She is having a lot of pain over the distal IT band to the lateral aspect of the knee itself.  There are some bruising in this area today as well.  There is no knee joint effusion and her knee is ligamentously stable.  Her right hip incision looks great and I remove the Steri-Strips.  There is no significant seroma.  I did place a steroid injection around the lateral aspect of her right knee but not in the knee joint itself.  Hopefully this will give her some relief.  Will see her back at her regular visit for her hip in the next month.  No x-rays are needed.

## 2022-06-14 ENCOUNTER — Ambulatory Visit: Payer: Medicare Other

## 2022-06-19 ENCOUNTER — Ambulatory Visit: Payer: Medicare Other | Admitting: Physical Therapy

## 2022-06-19 ENCOUNTER — Encounter: Payer: Self-pay | Admitting: Physical Therapy

## 2022-06-19 DIAGNOSIS — R2681 Unsteadiness on feet: Secondary | ICD-10-CM

## 2022-06-19 DIAGNOSIS — M87051 Idiopathic aseptic necrosis of right femur: Secondary | ICD-10-CM | POA: Diagnosis not present

## 2022-06-19 DIAGNOSIS — R2689 Other abnormalities of gait and mobility: Secondary | ICD-10-CM | POA: Diagnosis not present

## 2022-06-19 DIAGNOSIS — M25651 Stiffness of right hip, not elsewhere classified: Secondary | ICD-10-CM | POA: Diagnosis not present

## 2022-06-19 DIAGNOSIS — M25661 Stiffness of right knee, not elsewhere classified: Secondary | ICD-10-CM | POA: Diagnosis not present

## 2022-06-19 DIAGNOSIS — M25561 Pain in right knee: Secondary | ICD-10-CM

## 2022-06-19 DIAGNOSIS — Z96641 Presence of right artificial hip joint: Secondary | ICD-10-CM | POA: Diagnosis not present

## 2022-06-19 NOTE — Therapy (Signed)
OUTPATIENT PHYSICAL THERAPY TREATMENT   Patient Name: EARLYNE FEESER MRN: [id number] DOB:1955-03-30, 67 y.o., female Today's Date: 06/19/2022   END OF SESSION:  PT End of Session - 06/19/22 1414     Visit Number 3    Number of Visits 6    Date for PT Re-Evaluation 07/16/22    Authorization Type [employer] Medicare    PT Start Time 1403    PT Stop Time 1443    PT Time Calculation (min) 40 min    Activity Tolerance Patient tolerated treatment well    Behavior During Therapy WFL for tasks assessed/performed               Past Medical History:  Diagnosis Date   Anemia    Angio-edema    Anxiety    Arthritis    Asthma    Atrial fibrillation    Atrial tachycardia    Autonomic dysfunction    Complication of anesthesia    pt states wakes up with "shakes"   CVA (cerebral infarction)    2012 with dizziness and vision change felt embolic from atrial tachy   Disorder of bone and cartilage, unspecified    Diverticulosis    DM (diabetes mellitus)    DVT (deep venous thrombosis)    Eczema    Family history of anesthesia complication    PONV and " shaking "   Fatty liver    GERD (gastroesophageal reflux disease)    History of hiatal hernia    History of radiation therapy 10/24/2012   brain   HLD (hyperlipidemia)    HTN (hypertension)    Hypoglycemia, unspecified    Liver metastasis    Lung metastasis    Metastasis to brain    Osteopenia    Other nonspecific abnormal serum enzyme levels    Other specified congenital anomalies of nervous system    Pacemaker    autonomic dysfunction   Pancreatitis    from therapy   Peripheral vascular disease    POTS (postural orthostatic tachycardia syndrome)    Raynaud's disease    due to chemo   Skin cancer    Hx: of lung lesion   Past Surgical History:  Procedure Laterality Date   ABLATION SAPHENOUS VEIN W/ RFA     2002 x3   CARPAL TUNNEL RELEASE Left 2000   CHOLECYSTECTOMY N/A 03/28/2018   Procedure: LAPAROSCOPIC  CHOLECYSTECTOMY WITH INTRAOPERATIVE CHOLANGIOGRAM;  Surgeon: Luretha Murphy, MD;  Location: [facility] OR;  Service: General;  Laterality: N/A;   CRANIOTOMY N/A 09/30/2012   suboccipital craniectomy   CRANIOTOMY Left 02/09/2013   Procedure: LEFT PARIETAL CRANIOTOMY with stealth;  Surgeon: Barnett Abu, MD;  Location: [facility] OR[facility];  Service: Neurosurgery;  Laterality: Left;  LEFT Parietal Craniotomy for tumor with stealth   DEEP AXILLARY SENTINEL NODE BIOPSY / EXCISION     due to extensive Melanoma-right arm   fatty tumor removed  2000   from chest   INSERT / REPLACE / REMOVE PACEMAKER  2012   1999, x 3   LAPAROSCOPY  09/06/2011   Procedure: LAPAROSCOPY OPERATIVE;  Surgeon: Tresa Endo A. Ernestina Penna, MD;  Location: [facility] ORS;  Service: Gynecology;  Laterality: Left;  with Left Ovarian Cystectomy    MELANOMA EXCISION     with removal of lymph nodes, left shoulder   NOSE SURGERY  2010, 2012   for nose bleeds x 2   SINOSCOPY     TONSILLECTOMY AND ADENOIDECTOMY     TOTAL HIP ARTHROPLASTY Right 05/18/2022  Procedure: RIGHT TOTAL HIP ARTHROPLASTY ANTERIOR APPROACH;  Surgeon: Kathryne Hitch, MD;  Location: [facility] ORS;  Service: Orthopedics;  Laterality: Right;   Patient Active Problem List   Diagnosis Date Noted   Status post total replacement of right hip 05/18/2022   Avascular necrosis of bone of right hip 04/19/2022   Moderate persistent asthma 12/08/2018   Perennial allergic rhinitis with predominantly nonallergic component 12/08/2018   Allergic conjunctivitis 12/08/2018   History of epistaxis 12/08/2018   Atopic dermatitis 12/08/2018   Recurrent urticaria 12/08/2018   Skeeter syndrome 12/08/2018   Skin lesion of chest wall 07/22/2018   S/P laparoscopic cholecystectomy 03/28/2018   Dehydration 05/18/2016   Type 2 diabetes mellitus without complication, without long-term current use of insulin 11/01/2015   History of pacemaker    POTS (postural orthostatic tachycardia syndrome)    Iron  deficiency anemia 11/11/2014   Chronic colitis 08/28/2013   Malignant melanoma, metastatic 02/09/2013   Malignant melanoma 12/01/2012   Brain metastasis 10/16/2012   Hypertension 09/30/2012   Anxiety state, unspecified 09/30/2012   Hypoxemia 09/30/2012   Disorder of brain 09/08/2012   Dysautonomia 02/25/2011   Anticoagulant long-term use 01/19/2011   Neuritis 01/19/2011   Skin change 01/19/2011   CVA (cerebral infarction)    Palpitation 06/28/2010   Pacemaker-Biotronik 06/28/2010   Dizziness and giddiness 04/27/2010   DIVERTICULOSIS, COLON, HX OF 01/19/2009   CAROTID SINUS SYNDROME 11/17/2008   OTHER MALAISE AND FATIGUE 11/17/2008   HYPERTENSION, BENIGN 11/02/2008   ATRIAL TACHYCARDIA 09/16/2008   SYNCOPE, HX OF 08/09/2008   INJURY UNSPEC NERVE SHOULDER GIRDLE&UPPER LIMB 07/12/2008   PERSONAL HISTORY OF MALIGNANT MELANOMA OF SKIN 07/12/2008   LUQ PAIN 12/08/2007   NECK PAIN 09/26/2007   NUMBNESS, ARM 09/26/2007   CPK, ABNORMAL 09/26/2007   ARM PAIN, LEFT 06/25/2007   SWELLING OF LIMB 06/25/2007   OSTEOPENIA 06/25/2007    PCP: Madelin Headings, MD   REFERRING PROVIDER: Kathryne Hitch, MD  REFERRING DIAG:  [phone] (ICD-10-CM) - Status post total replacement of right hip  M87.051 (ICD-10-CM) - Avascular necrosis of bone of right hip   THERAPY DIAG:  Other abnormalities of gait and mobility  Acute pain of right knee  Stiffness of right hip, not elsewhere classified  Stiffness of right knee, not elsewhere classified  Unsteadiness on feet  RATIONALE FOR EVALUATION AND TREATMENT: Rehabilitation  ONSET DATE: 05/18/2022 - R THA, anterior approach    NEXT MD VISIT: 06/28/2022   SUBJECTIVE:  SUBJECTIVE STATEMENT:  Things are going OK, I think I'm improving and  walking a mile to maybe two miles a day. I do it in increments, I'm walking outside, I'm doing my stretching. Yesterday I woke up with my left knee swollen but it went away with movement and Motrin. Saw Dr. Magnus Ivan after last PT session, he put a cortisone shot in that knee. Its hard for me to bend over safely, balance is hard. One leg is longer than the other but I put a lift. I do a lot during the day, want to make sure that everything I'm doing is safe. Concerned about getting into forerunner car which is higher up too. Still fatigued in legs by end of the day.   S/P Right total hip replacement on 05/19/22. Has completed HHPT, but would like a few visits to help her transition safely from walker to cane. Total of 5 visits max for this per Dr. Magnus Ivan.   PAIN: Are you having pain? Yes: NPRS scale: 5/10 Pain location: back pain Pain description: dull ache  Aggravating factors: activity  Relieving factors: ice, Motrin  PERTINENT HISTORY:  PMH includes but is not limited to: anxiety, asthma, DM-II, HTN, CVA, DVT, POTS, pacemaker, malignant melanoma with brain, liver and lung metastasis s/p radiation and L parietal craniotomy, osteopenia, diverticulitis, GERD, and Raynaud's disease.   PRECAUTIONS: ICD/Pacemaker and Other: POTS  WEIGHT BEARING RESTRICTIONS: No  FALLS:  Has patient fallen in last 6 months? No  LIVING ENVIRONMENT: Lives with: lives with their spouse Lives in: House/apartment Stairs: Yes: Internal: 13 steps; on right going up, on left going up, and can reach both Has following equipment at home: Single point cane, Walker - 2 wheeled, and shower chair  OCCUPATION: Medically disabled from her cancer  PLOF: Independent and Leisure: walking 3-5 miles, 3-4x/wk (power walking with weights), conditioning with weights  PATIENT GOALS: "Walk out of here normally with no pain and back to normal activity."   OBJECTIVE: (objective measures completed at initial evaluation unless  otherwise dated)  DIAGNOSTIC FINDINGS:  05/18/22 - R hip and pelvis x-rays: Status post right hip arthroplasty without evidence of complication.  PATIENT SURVEYS:  LEFS 22 / 80 = 27.5 %  COGNITION: Overall cognitive status: Within functional limits for tasks assessed    SENSATION: Neuropathy in B feet, Raynaud's syndrome  EDEMA:  Mild edema R knee and distal thigh  MUSCLE LENGTH: Hamstrings: mild tight R>L ITB: mod tight R Piriformis: mod tight R Hip flexors: mod tight R Quads: mod tight R Heelcord: NT  POSTURE:  weight shift left and leg length discrepancy with R LE longer than L  PALPATION: TTP over distal lateral R quads  LOWER EXTREMITY ROM: R hip limited in all directions Active ROM Right eval Left eval  Hip flexion    Hip extension    Hip abduction    Hip adduction    Hip internal rotation    Hip external rotation    Knee flexion    Knee extension -1 0  Ankle dorsiflexion    Ankle plantarflexion    Ankle inversion    Ankle eversion    (Blank rows = not tested)  LOWER EXTREMITY MMT:  MMT Right eval Left eval  Hip flexion 3+ 4-  Hip extension 4- 4-  Hip abduction 4- 4  Hip adduction 4 4  Hip internal rotation 4+ 4+  Hip external rotation 2+ 4  Knee flexion 5 5  Knee extension 4+ 5  Ankle dorsiflexion  5 5  Ankle plantarflexion 5 5  Ankle inversion    Ankle eversion     (Blank rows = not tested)  LOWER EXTREMITY SPECIAL TESTS:  Hip special tests: Ober's test: positive   FUNCTIONAL TESTS:  5 times sit to stand: 20.79 sec (knees together) Timed up and go (TUG): 11.66 sec 10 meter walk test: 10.31 sec w/o AD, 10.93 sec with SPC Functional gait assessment: 16/30, < 19 = high risk fall   GAIT: Distance walked: 27ft w/ SPC and w/o AD Assistive device utilized: Single point cane and None Level of assistance: Modified independence and SBA Gait pattern: step through pattern, decreased stride length, decreased hip/knee flexion- Right, antalgic,  and trendelenburg Comments: limp more pronounced w/o AD  STAIRS: Level of Assistance: SBA Stair Negotiation Technique: Step to Pattern with Single Rail on Right Number of Stairs: 14 x 2  Height of Stairs: 7  Comments: Patient defaulting to step to pattern as training by home health PT, but able to perform 1 flight with reciprocal pattern and SBA of PT   TODAY'S TREATMENT:    06/19/22  TherEx  Nustep L4x6 minutes BLEs only Lumbar rotation stretches 5x5 seconds B (added to HEP) SKTC 5x5 second holds B (added to HEP)  NMR  Tandem stance blue foam pad 3x30 seconds B  Standing on blue foam pad EC 3x30 seconds  Standing on blue foam pad lateral toe taps x10 B SLS with one foot on 8 inch stool 3x30 seconds B   Selfcare  General course of recovery from [facility] surgery, mechanics/demonstration for safe transfer in/out of high car, POC next session    06/13/22 THERAPEUTIC EXERCISE: to improve flexibility, strength and mobility.  Verbal and tactile cues throughout for technique. Nustep L3x25min R Hooklying HS stretch with strap  x 30" R Supine ITB stretch with strap x 30"   STS x 10 - good knee alignment Bridge with glute set x 10  Seated heel slide and LAQ x 10 RLE Standing clamshells RTB x 10  Standing hip abduction x 10 RTB at counter     06/04/22 Initial eval THERAPEUTIC EXERCISE: to improve flexibility, strength and mobility.  Verbal and tactile cues throughout for technique.  R Hooklying HS stretch with strap  x 30" R Supine ITB stretch with strap x 30"   STS x 5 - cues to keep neutral hip, knee and foot alignment, avoiding knees coming together  PATIENT EDUCATION:  Education details: PT eval findings, anticipated POC, and initial HEP Person educated: Patient and Spouse Education method: Explanation, Demonstration, Verbal cues, and Handouts Education comprehension: verbalized understanding, returned demonstration, verbal cues required, and needs further education  HOME  EXERCISE PROGRAM: Access Code: [email or web address] URL: [email or web address] Date: 06/13/2022 Prepared by: Verta Ellen  Exercises - Hooklying Hamstring Stretch with Strap  - 2 x daily - 7 x weekly - 3 reps - 30 sec hold - Supine Iliotibial Band Stretch with Strap  - 2 x daily - 7 x weekly - 3 reps - 30 sec hold - Sit to Stand  - 2 x daily - 7 x weekly - 2 sets - 10 reps - 3 sec hold - Standing Hamstring Curl with Resistance  - 1 x daily - 7 x weekly - 3 sets - 10 reps - Standing Hip Abduction with Resistance at Ankles and Counter Support  - 1 x daily - 7 x weekly - 3 sets - 10 reps - Seated Heel Slide  - 1 x daily - 7  x weekly - 3 sets - 10 reps   ASSESSMENT:  CLINICAL IMPRESSION:  Aditi arrives today doing well, we worked on a mix of functional activity tolerance, strengthening, and balance. Also showed her some stretches to help address back stiffness/back pain.She does have some concerns about getting in/out of the car, we generally reviewed the best way to get into a high car. She plans to bring her car to next session, we will practice this next visit.   OBJECTIVE IMPAIRMENTS: Abnormal gait, decreased activity tolerance, decreased balance, decreased coordination, decreased endurance, decreased knowledge of condition, decreased knowledge of use of DME, decreased mobility, difficulty walking, decreased ROM, decreased strength, decreased safety awareness, increased fascial restrictions, impaired perceived functional ability, increased muscle spasms, impaired flexibility, improper body mechanics, postural dysfunction, and pain.   ACTIVITY LIMITATIONS: carrying, lifting, bending, sitting, standing, squatting, sleeping, stairs, transfers, bed mobility, bathing, and locomotion level  PARTICIPATION LIMITATIONS: meal prep, cleaning, laundry, driving, shopping, and community activity  PERSONAL FACTORS: Past/current experiences, Time since onset of injury/illness/exacerbation, and 3+  comorbidities: anxiety, asthma, DM-II, HTN, CVA, DVT, POTS, pacemaker, malignant melanoma with brain, liver and lung metastasis s/p radiation and L parietal craniotomy, osteopenia, diverticulitis, GERD, and Raynaud's disease  are also affecting patient's functional outcome.   REHAB POTENTIAL: Good  CLINICAL DECISION MAKING: Evolving/moderate complexity  EVALUATION COMPLEXITY: Moderate   GOALS: Goals reviewed with patient? Yes  SHORT TERM GOALS: Target date: 06/25/2022   Patient will be independent with initial HEP. Baseline: Patient currently performing HH PT HEP Goal status: INITIAL  2.  Patient will improve 5x STS time to </= 15 seconds for improved efficiency and safety with transfers Baseline: 20.79 sec Goal status: INITIAL  LONG TERM GOALS: Target date: 07/16/2022   Patient will be independent with advanced/ongoing HEP to improve outcomes and carryover.  Baseline:  Goal status: INITIAL  2.  Patient will report at least 50-75% improvement in R hip and knee pain to improve QOL. Baseline:  Goal status: INITIAL  3.  Patient will demonstrate improved proximal knee LE strength to >/= 4+/5 for improved stability and ease of mobility. Baseline: Refer to above MMT table Goal status: INITIAL  4.  Patient will be able to ambulate 600' with or w/o SPC and normal gait pattern without increased pain to access community.  Baseline:  Goal status: INITIAL  5.  Patient will report >/= 31/80 on LEFS to demonstrate improved functional ability. Baseline: 22 / 80 = 27.5 % Goal status: INITIAL  6.  Patient will demonstrate at least 20/24 on FGA to decrease risk of falls. Baseline: 16/30 Goal status: INITIAL    PLAN:  PT FREQUENCY: 1-2x/week (Total of 5 visits max for this per Dr. Magnus Ivan)   PT DURATION: 6 weeks  PLANNED INTERVENTIONS: Therapeutic exercises, Therapeutic activity, Neuromuscular re-education, Balance training, Gait training, Patient/Family education, Self Care, Joint  mobilization, Stair training, DME instructions, Dry Needling, Electrical stimulation, Cryotherapy, Moist heat, Taping, Vasopneumatic device, Ultrasound, Ionotophoresis 4mg /ml Dexamethasone, Manual therapy, and Re-evaluation  PLAN FOR NEXT SESSION: Review initial HEP adding stretches as indicated to address remaining tightness; progressed hip strengthening with regular HEP updates due to limited visits; balance and dynamic gait training to reduce fall risk; MT +/- DN to address abnormal muscle tension and pain. Car transfers into tall SUV   Nedra Hai PT DPT PN2

## 2022-06-25 DIAGNOSIS — C7931 Secondary malignant neoplasm of brain: Secondary | ICD-10-CM | POA: Diagnosis not present

## 2022-06-26 ENCOUNTER — Encounter: Payer: Self-pay | Admitting: Physical Therapy

## 2022-06-26 ENCOUNTER — Ambulatory Visit: Payer: Medicare Other | Admitting: Physical Therapy

## 2022-06-26 ENCOUNTER — Other Ambulatory Visit (HOSPITAL_COMMUNITY): Payer: Self-pay | Admitting: Neurological Surgery

## 2022-06-26 DIAGNOSIS — M87051 Idiopathic aseptic necrosis of right femur: Secondary | ICD-10-CM | POA: Diagnosis not present

## 2022-06-26 DIAGNOSIS — M25651 Stiffness of right hip, not elsewhere classified: Secondary | ICD-10-CM

## 2022-06-26 DIAGNOSIS — Z96641 Presence of right artificial hip joint: Secondary | ICD-10-CM | POA: Diagnosis not present

## 2022-06-26 DIAGNOSIS — R2681 Unsteadiness on feet: Secondary | ICD-10-CM | POA: Diagnosis not present

## 2022-06-26 DIAGNOSIS — M25661 Stiffness of right knee, not elsewhere classified: Secondary | ICD-10-CM

## 2022-06-26 DIAGNOSIS — M25561 Pain in right knee: Secondary | ICD-10-CM

## 2022-06-26 DIAGNOSIS — R2689 Other abnormalities of gait and mobility: Secondary | ICD-10-CM | POA: Diagnosis not present

## 2022-06-26 DIAGNOSIS — C7931 Secondary malignant neoplasm of brain: Secondary | ICD-10-CM

## 2022-06-26 NOTE — Therapy (Signed)
OUTPATIENT PHYSICAL THERAPY TREATMENT   Patient Name: [name] MRN: [id number] DOB:10-02-1955, 67 y.o., female Today's Date: 06/26/2022   END OF SESSION:  PT End of Session - 06/26/22 1332     Visit Number 4    Number of Visits 6    Date for PT Re-Evaluation 07/16/22    Authorization Type [employer] Medicare    PT Start Time 1317    PT Stop Time 1356    PT Time Calculation (min) 39 min    Activity Tolerance Patient tolerated treatment well    Behavior During Therapy WFL for tasks assessed/performed                Past Medical History:  Diagnosis Date   Anemia    Angio-edema    Anxiety    Arthritis    Asthma    Atrial fibrillation (HCC)    Atrial tachycardia    Autonomic dysfunction    Complication of anesthesia    pt states wakes up with "shakes"   CVA (cerebral infarction)    2012 with dizziness and vision change felt embolic from atrial tachy   Disorder of bone and cartilage, unspecified    Diverticulosis    DM (diabetes mellitus) (HCC)    DVT (deep venous thrombosis) (HCC)    Eczema    Family history of anesthesia complication    PONV and " shaking "   Fatty liver    GERD (gastroesophageal reflux disease)    History of hiatal hernia    History of radiation therapy 10/24/2012   brain   HLD (hyperlipidemia)    HTN (hypertension)    Hypoglycemia, unspecified    Liver metastasis    Lung metastasis    Metastasis to brain (HCC)    Osteopenia    Other nonspecific abnormal serum enzyme levels    Other specified congenital anomalies of nervous system    Pacemaker    autonomic dysfunction   Pancreatitis    from therapy   Peripheral vascular disease (HCC)    POTS (postural orthostatic tachycardia syndrome)    Raynaud's disease    due to chemo   Skin cancer    Hx: of lung lesion   Past Surgical History:  Procedure Laterality Date   ABLATION SAPHENOUS VEIN W/ RFA     2002 x3   CARPAL TUNNEL RELEASE Left 2000   CHOLECYSTECTOMY N/A 03/28/2018    Procedure: LAPAROSCOPIC CHOLECYSTECTOMY WITH INTRAOPERATIVE CHOLANGIOGRAM;  Surgeon: Luretha Murphy, MD;  Location: [facility] OR;  Service: General;  Laterality: N/A;   CRANIOTOMY N/A 09/30/2012   suboccipital craniectomy   CRANIOTOMY Left 02/09/2013   Procedure: LEFT PARIETAL CRANIOTOMY with stealth;  Surgeon: Barnett Abu, MD;  Location: [facility];  Service: Neurosurgery;  Laterality: Left;  LEFT Parietal Craniotomy for tumor with stealth   DEEP AXILLARY SENTINEL NODE BIOPSY / EXCISION     due to extensive Melanoma-right arm   fatty tumor removed  2000   from chest   INSERT / REPLACE / REMOVE PACEMAKER  2012   1999, x 3   LAPAROSCOPY  09/06/2011   Procedure: LAPAROSCOPY OPERATIVE;  Surgeon: Tresa Endo A. Ernestina Penna, MD;  Location: [facility];  Service: Gynecology;  Laterality: Left;  with Left Ovarian Cystectomy    MELANOMA EXCISION     with removal of lymph nodes, left shoulder   NOSE SURGERY  2010, 2012   for nose bleeds x 2   SINOSCOPY     TONSILLECTOMY AND ADENOIDECTOMY  TOTAL HIP ARTHROPLASTY Right 05/18/2022   Procedure: RIGHT TOTAL HIP ARTHROPLASTY ANTERIOR APPROACH;  Surgeon: Kathryne Hitch, MD;  Location: [facility];  Service: Orthopedics;  Laterality: Right;   Patient Active Problem List   Diagnosis Date Noted   Status post total replacement of right hip 05/18/2022   Avascular necrosis of bone of right hip (HCC) 04/19/2022   Moderate persistent asthma 12/08/2018   Perennial allergic rhinitis with predominantly nonallergic component 12/08/2018   Allergic conjunctivitis 12/08/2018   History of epistaxis 12/08/2018   Atopic dermatitis 12/08/2018   Recurrent urticaria 12/08/2018   Skeeter syndrome 12/08/2018   Skin lesion of chest wall 07/22/2018   S/P laparoscopic cholecystectomy 03/28/2018   Dehydration 05/18/2016   Type 2 diabetes mellitus without complication, without long-term current use of insulin (HCC) 11/01/2015   History of pacemaker    POTS (postural orthostatic  tachycardia syndrome)    Iron deficiency anemia 11/11/2014   Chronic colitis 08/28/2013   Malignant melanoma, metastatic (HCC) 02/09/2013   Malignant melanoma (HCC) 12/01/2012   Brain metastasis 10/16/2012   Hypertension 09/30/2012   Anxiety state, unspecified 09/30/2012   Hypoxemia 09/30/2012   Disorder of brain 09/08/2012   Dysautonomia (HCC) 02/25/2011   Anticoagulant long-term use 01/19/2011   Neuritis 01/19/2011   Skin change 01/19/2011   CVA (cerebral infarction)    Palpitation 06/28/2010   Pacemaker-Biotronik 06/28/2010   Dizziness and giddiness 04/27/2010   DIVERTICULOSIS, COLON, HX OF 01/19/2009   CAROTID SINUS SYNDROME 11/17/2008   OTHER MALAISE AND FATIGUE 11/17/2008   HYPERTENSION, BENIGN 11/02/2008   ATRIAL TACHYCARDIA 09/16/2008   SYNCOPE, HX OF 08/09/2008   INJURY UNSPEC NERVE SHOULDER GIRDLE&UPPER LIMB 07/12/2008   PERSONAL HISTORY OF MALIGNANT MELANOMA OF SKIN 07/12/2008   LUQ PAIN 12/08/2007   NECK PAIN 09/26/2007   NUMBNESS, ARM 09/26/2007   CPK, ABNORMAL 09/26/2007   ARM PAIN, LEFT 06/25/2007   SWELLING OF LIMB 06/25/2007   OSTEOPENIA 06/25/2007    PCP: Madelin Headings, MD   REFERRING PROVIDER: Kathryne Hitch, MD  REFERRING DIAG:  [phone] (ICD-10-CM) - Status post total replacement of right hip  M87.051 (ICD-10-CM) - Avascular necrosis of bone of right hip   THERAPY DIAG:  Other abnormalities of gait and mobility  Stiffness of right hip, not elsewhere classified  Stiffness of right knee, not elsewhere classified  Acute pain of right knee  Unsteadiness on feet  RATIONALE FOR EVALUATION AND TREATMENT: Rehabilitation  ONSET DATE: 05/18/2022 - R THA, anterior approach    NEXT MD VISIT: 06/28/2022   SUBJECTIVE:  SUBJECTIVE  STATEMENT:  Forgot my cane today so I must be feeling better, still feeling stiff in the mornings when I get up but still doing my exercises. Neurosurgeon is worried about my balance. Feeling a lot better about the car.   S/P Right total hip replacement on 05/19/22. Has completed HHPT, but would like a few visits to help her transition safely from walker to cane. Total of 5 visits max for this per Dr. Magnus Ivan.   PAIN: Are you having pain? Yes: NPRS scale: 3-4/10 Pain location: R knee  Pain description: aching  Aggravating factors: exercise  Relieving factors: ice  PERTINENT HISTORY:  PMH includes but is not limited to: anxiety, asthma, DM-II, HTN, CVA, DVT, POTS, pacemaker, malignant melanoma with brain, liver and lung metastasis s/p radiation and L parietal craniotomy, osteopenia, diverticulitis, GERD, and Raynaud's disease.   PRECAUTIONS: ICD/Pacemaker and Other: POTS  WEIGHT BEARING RESTRICTIONS: No  FALLS:  Has patient fallen in last 6 months? No  LIVING ENVIRONMENT: Lives with: lives with their spouse Lives in: House/apartment Stairs: Yes: Internal: 13 steps; on right going up, on left going up, and can reach both Has following equipment at home: Single point cane, Walker - 2 wheeled, and shower chair  OCCUPATION: Medically disabled from her cancer  PLOF: Independent and Leisure: walking 3-5 miles, 3-4x/wk (power walking with weights), conditioning with weights  PATIENT GOALS: "Walk out of here normally with no pain and back to normal activity."   OBJECTIVE: (objective measures completed at initial evaluation unless otherwise dated)  DIAGNOSTIC FINDINGS:  05/18/22 - R hip and pelvis x-rays: Status post right hip arthroplasty without evidence of complication.  PATIENT SURVEYS:  LEFS 22 / 80 = 27.5 %  COGNITION: Overall cognitive status: Within functional limits for tasks assessed    SENSATION: Neuropathy in B feet, Raynaud's syndrome  EDEMA:  Mild edema R knee  and distal thigh  MUSCLE LENGTH: Hamstrings: mild tight R>L ITB: mod tight R Piriformis: mod tight R Hip flexors: mod tight R Quads: mod tight R Heelcord: NT  POSTURE:  weight shift left and leg length discrepancy with R LE longer than L  PALPATION: TTP over distal lateral R quads  LOWER EXTREMITY ROM: R hip limited in all directions Active ROM Right eval Left eval  Hip flexion    Hip extension    Hip abduction    Hip adduction    Hip internal rotation    Hip external rotation    Knee flexion    Knee extension -1 0  Ankle dorsiflexion    Ankle plantarflexion    Ankle inversion    Ankle eversion    (Blank rows = not tested)  LOWER EXTREMITY MMT:  MMT Right eval Left eval  Hip flexion 3+ 4-  Hip extension 4- 4-  Hip abduction 4- 4  Hip adduction 4 4  Hip internal rotation 4+ 4+  Hip external rotation 2+ 4  Knee flexion 5 5  Knee extension 4+ 5  Ankle dorsiflexion 5 5  Ankle plantarflexion 5 5  Ankle inversion    Ankle eversion     (Blank rows = not tested)  LOWER EXTREMITY SPECIAL TESTS:  Hip special tests: Ober's test: positive   FUNCTIONAL TESTS:  5 times sit to stand: 20.79 sec (knees together) Timed up and go (TUG): 11.66 sec 10 meter walk test: 10.31 sec w/o AD, 10.93 sec with SPC Functional gait assessment: 16/30, < 19 = high risk fall   GAIT: Distance walked: 10ft  w/ SPC and w/o AD Assistive device utilized: Single point cane and None Level of assistance: Modified independence and SBA Gait pattern: step through pattern, decreased stride length, decreased hip/knee flexion- Right, antalgic, and trendelenburg Comments: limp more pronounced w/o AD  STAIRS: Level of Assistance: SBA Stair Negotiation Technique: Step to Pattern with Single Rail on Right Number of Stairs: 14 x 2  Height of Stairs: 7  Comments: Patient defaulting to step to pattern as training by home health PT, but able to perform 1 flight with reciprocal pattern and SBA of  PT   TODAY'S TREATMENT:   06/26/22  TherEx   Nustep L5x6 minutes BLEs only   NMR  Tandem stance blue foam pad 3x30 seconds B SLS with one foot on TM 3x30 seconds B 3 way taps off blue foam pad x10 B Cross midline taps on edge of TM blue foam pad x10 B Standing marches x20 on blue foam pad STS from regular chair with feet on blue foam pad 2x10 Lateral cone taps B while standing on blue foam pad x10 Step forwards/backs clearing blue foam pad x10 B        06/19/22  TherEx  Nustep L4x6 minutes BLEs only Lumbar rotation stretches 5x5 seconds B (added to HEP) SKTC 5x5 second holds B (added to HEP)  NMR  Tandem stance blue foam pad 3x30 seconds B  Standing on blue foam pad EC 3x30 seconds  Standing on blue foam pad lateral toe taps x10 B SLS with one foot on 8 inch stool 3x30 seconds B   Selfcare  General course of recovery from [facility] surgery, mechanics/demonstration for safe transfer in/out of high car, POC next session    06/13/22 THERAPEUTIC EXERCISE: to improve flexibility, strength and mobility.  Verbal and tactile cues throughout for technique. Nustep L3x89min R Hooklying HS stretch with strap  x 30" R Supine ITB stretch with strap x 30"   STS x 10 - good knee alignment Bridge with glute set x 10  Seated heel slide and LAQ x 10 RLE Standing clamshells RTB x 10  Standing hip abduction x 10 RTB at counter     06/04/22 Initial eval THERAPEUTIC EXERCISE: to improve flexibility, strength and mobility.  Verbal and tactile cues throughout for technique.  R Hooklying HS stretch with strap  x 30" R Supine ITB stretch with strap x 30"   STS x 5 - cues to keep neutral hip, knee and foot alignment, avoiding knees coming together  PATIENT EDUCATION:  Education details: PT eval findings, anticipated POC, and initial HEP Person educated: Patient and Spouse Education method: Explanation, Demonstration, Verbal cues, and Handouts Education comprehension: verbalized  understanding, returned demonstration, verbal cues required, and needs further education  HOME EXERCISE PROGRAM: Access Code: [email or web address] URL: [email or web address] Date: 06/13/2022 Prepared by: Verta Ellen  Exercises - Hooklying Hamstring Stretch with Strap  - 2 x daily - 7 x weekly - 3 reps - 30 sec hold - Supine Iliotibial Band Stretch with Strap  - 2 x daily - 7 x weekly - 3 reps - 30 sec hold - Sit to Stand  - 2 x daily - 7 x weekly - 2 sets - 10 reps - 3 sec hold - Standing Hamstring Curl with Resistance  - 1 x daily - 7 x weekly - 3 sets - 10 reps - Standing Hip Abduction with Resistance at Ankles and Counter Support  - 1 x daily - 7 x weekly - 3 sets - 10 reps -  Seated Heel Slide  - 1 x daily - 7 x weekly - 3 sets - 10 reps   ASSESSMENT:  CLINICAL IMPRESSION:  Margaretann arrives today doing OK, sounds like she's having an easier time getting out of the car after last session. Mostly concerned about her balance, we focused on that today. Only has a couple more sessions left, we will plan on re-assessment next visit.    OBJECTIVE IMPAIRMENTS: Abnormal gait, decreased activity tolerance, decreased balance, decreased coordination, decreased endurance, decreased knowledge of condition, decreased knowledge of use of DME, decreased mobility, difficulty walking, decreased ROM, decreased strength, decreased safety awareness, increased fascial restrictions, impaired perceived functional ability, increased muscle spasms, impaired flexibility, improper body mechanics, postural dysfunction, and pain.   ACTIVITY LIMITATIONS: carrying, lifting, bending, sitting, standing, squatting, sleeping, stairs, transfers, bed mobility, bathing, and locomotion level  PARTICIPATION LIMITATIONS: meal prep, cleaning, laundry, driving, shopping, and community activity  PERSONAL FACTORS: Past/current experiences, Time since onset of injury/illness/exacerbation, and 3+ comorbidities: anxiety, asthma,  DM-II, HTN, CVA, DVT, POTS, pacemaker, malignant melanoma with brain, liver and lung metastasis s/p radiation and L parietal craniotomy, osteopenia, diverticulitis, GERD, and Raynaud's disease  are also affecting patient's functional outcome.   REHAB POTENTIAL: Good  CLINICAL DECISION MAKING: Evolving/moderate complexity  EVALUATION COMPLEXITY: Moderate   GOALS: Goals reviewed with patient? Yes  SHORT TERM GOALS: Target date: 06/25/2022   Patient will be independent with initial HEP. Baseline: Patient currently performing HH PT HEP Goal status: INITIAL  2.  Patient will improve 5x STS time to </= 15 seconds for improved efficiency and safety with transfers Baseline: 20.79 sec Goal status: INITIAL  LONG TERM GOALS: Target date: 07/16/2022   Patient will be independent with advanced/ongoing HEP to improve outcomes and carryover.  Baseline:  Goal status: INITIAL  2.  Patient will report at least 50-75% improvement in R hip and knee pain to improve QOL. Baseline:  Goal status: INITIAL  3.  Patient will demonstrate improved proximal knee LE strength to >/= 4+/5 for improved stability and ease of mobility. Baseline: Refer to above MMT table Goal status: INITIAL  4.  Patient will be able to ambulate 600' with or w/o SPC and normal gait pattern without increased pain to access community.  Baseline:  Goal status: INITIAL  5.  Patient will report >/= 31/80 on LEFS to demonstrate improved functional ability. Baseline: 22 / 80 = 27.5 % Goal status: INITIAL  6.  Patient will demonstrate at least 20/24 on FGA to decrease risk of falls. Baseline: 16/30 Goal status: INITIAL    PLAN:  PT FREQUENCY: 1-2x/week (Total of 5 visits max for this per Dr. Magnus Ivan)   PT DURATION: 6 weeks  PLANNED INTERVENTIONS: Therapeutic exercises, Therapeutic activity, Neuromuscular re-education, Balance training, Gait training, Patient/Family education, Self Care, Joint mobilization, Stair training,  DME instructions, Dry Needling, Electrical stimulation, Cryotherapy, Moist heat, Taping, Vasopneumatic device, Ultrasound, Ionotophoresis 4mg /ml Dexamethasone, Manual therapy, and Re-evaluation  PLAN FOR NEXT SESSION: reassess next visit, possible DC to advanced HEP with balance additions?   Nedra Hai PT DPT PN2

## 2022-06-28 ENCOUNTER — Ambulatory Visit (INDEPENDENT_AMBULATORY_CARE_PROVIDER_SITE_OTHER): Payer: Medicare Other | Admitting: Orthopaedic Surgery

## 2022-06-28 ENCOUNTER — Encounter: Payer: Self-pay | Admitting: Orthopaedic Surgery

## 2022-06-28 DIAGNOSIS — Z96641 Presence of right artificial hip joint: Secondary | ICD-10-CM

## 2022-06-28 NOTE — Progress Notes (Signed)
[name] is now 6 weeks status post a right total hip arthroplasty.  She is currently still followed for stage IV melanoma.  She has seen Dr. Danielle Dess recently due to some balance issues and coordination issues.  She is in outpatient physical therapy and that is helping.  There is a MRI of her brain pending.  She has had lesions before and she has had surgery by Dr. Danielle Dess.  She says the hip is actually doing well.  She has had to use a lift in her opposite foot.  Her knees are doing better overall.  On exam she is walking without assistive device.  Her right operative hip moves smoothly and fluidly.  She can lay on that side now and can get underwater.  From my standpoint I do not need to see her back for 3 months.  At that visit we will have a standing low AP pelvis and lateral of her right hip.  If there are issues before then she should let us know.

## 2022-07-04 ENCOUNTER — Ambulatory Visit: Payer: Medicare Other | Attending: Orthopaedic Surgery

## 2022-07-04 DIAGNOSIS — M25661 Stiffness of right knee, not elsewhere classified: Secondary | ICD-10-CM

## 2022-07-04 DIAGNOSIS — R2681 Unsteadiness on feet: Secondary | ICD-10-CM | POA: Diagnosis not present

## 2022-07-04 DIAGNOSIS — M25651 Stiffness of right hip, not elsewhere classified: Secondary | ICD-10-CM

## 2022-07-04 DIAGNOSIS — M25561 Pain in right knee: Secondary | ICD-10-CM | POA: Diagnosis not present

## 2022-07-04 DIAGNOSIS — R2689 Other abnormalities of gait and mobility: Secondary | ICD-10-CM | POA: Diagnosis not present

## 2022-07-04 NOTE — Therapy (Signed)
OUTPATIENT PHYSICAL THERAPY TREATMENT   Patient Name: [name] MRN: [id number] DOB:[birth year], 67 y.o., female Today's Date: 07/04/2022   END OF SESSION:  PT End of Session - 07/04/22 1402     Visit Number 5    Number of Visits 6    Date for PT Re-Evaluation 07/16/22    Authorization Type [employer] Medicare    PT Start Time 1315    PT Stop Time 1359    PT Time Calculation (min) 44 min    Activity Tolerance Patient tolerated treatment well    Behavior During Therapy WFL for tasks assessed/performed                Past Medical History:  Diagnosis Date   Anemia    Angio-edema    Anxiety    Arthritis    Asthma    Atrial fibrillation (HCC)    Atrial tachycardia    Autonomic dysfunction    Complication of anesthesia    pt states wakes up with "shakes"   CVA (cerebral infarction)    2012 with dizziness and vision change felt embolic from atrial tachy   Disorder of bone and cartilage, unspecified    Diverticulosis    DM (diabetes mellitus) (HCC)    DVT (deep venous thrombosis) (HCC)    Eczema    Family history of anesthesia complication    PONV and " shaking "   Fatty liver    GERD (gastroesophageal reflux disease)    History of hiatal hernia    History of radiation therapy 10/24/2012   brain   HLD (hyperlipidemia)    HTN (hypertension)    Hypoglycemia, unspecified    Liver metastasis    Lung metastasis    Metastasis to brain (HCC)    Osteopenia    Other nonspecific abnormal serum enzyme levels    Other specified congenital anomalies of nervous system    Pacemaker    autonomic dysfunction   Pancreatitis    from therapy   Peripheral vascular disease (HCC)    POTS (postural orthostatic tachycardia syndrome)    Raynaud's disease    due to chemo   Skin cancer    Hx: of lung lesion   Past Surgical History:  Procedure Laterality Date   ABLATION SAPHENOUS VEIN W/ RFA     2002 x3   CARPAL TUNNEL RELEASE Left 2000   CHOLECYSTECTOMY N/A 03/28/2018    Procedure: LAPAROSCOPIC CHOLECYSTECTOMY WITH INTRAOPERATIVE CHOLANGIOGRAM;  Surgeon: Luretha Murphy, MD;  Location: [facility] OR;  Service: General;  Laterality: N/A;   CRANIOTOMY N/A 09/30/2012   suboccipital craniectomy   CRANIOTOMY Left 02/09/2013   Procedure: LEFT PARIETAL CRANIOTOMY with stealth;  Surgeon: Barnett Abu, MD;  Location: [facility];  Service: Neurosurgery;  Laterality: Left;  LEFT Parietal Craniotomy for tumor with stealth   DEEP AXILLARY SENTINEL NODE BIOPSY / EXCISION     due to extensive Melanoma-right arm   fatty tumor removed  2000   from chest   INSERT / REPLACE / REMOVE PACEMAKER  2012   1999, x 3   LAPAROSCOPY  09/06/2011   Procedure: LAPAROSCOPY OPERATIVE;  Surgeon: Tresa Endo A. Ernestina Penna, MD;  Location: [facility] ORS;  Service: Gynecology;  Laterality: Left;  with Left Ovarian Cystectomy    MELANOMA EXCISION     with removal of lymph nodes, left shoulder   NOSE SURGERY  2010, 2012   for nose bleeds x 2   SINOSCOPY     TONSILLECTOMY AND ADENOIDECTOMY  TOTAL HIP ARTHROPLASTY Right 05/18/2022   Procedure: RIGHT TOTAL HIP ARTHROPLASTY ANTERIOR APPROACH;  Surgeon: Kathryne Hitch, MD;  Location: [facility] ORS;  Service: Orthopedics;  Laterality: Right;   Patient Active Problem List   Diagnosis Date Noted   Status post total replacement of right hip 05/18/2022   Avascular necrosis of bone of right hip (HCC) 04/19/2022   Moderate persistent asthma 12/08/2018   Perennial allergic rhinitis with predominantly nonallergic component 12/08/2018   Allergic conjunctivitis 12/08/2018   History of epistaxis 12/08/2018   Atopic dermatitis 12/08/2018   Recurrent urticaria 12/08/2018   Skeeter syndrome 12/08/2018   Skin lesion of chest wall 07/22/2018   S/P laparoscopic cholecystectomy 03/28/2018   Dehydration 05/18/2016   Type 2 diabetes mellitus without complication, without long-term current use of insulin (HCC) 11/01/2015   History of pacemaker    POTS (postural orthostatic  tachycardia syndrome)    Iron deficiency anemia 11/11/2014   Chronic colitis 08/28/2013   Malignant melanoma, metastatic (HCC) 02/09/2013   Malignant melanoma (HCC) 12/01/2012   Brain metastasis 10/16/2012   Hypertension 09/30/2012   Anxiety state, unspecified 09/30/2012   Hypoxemia 09/30/2012   Disorder of brain 09/08/2012   Dysautonomia (HCC) 02/25/2011   Anticoagulant long-term use 01/19/2011   Neuritis 01/19/2011   Skin change 01/19/2011   CVA (cerebral infarction)    Palpitation 06/28/2010   Pacemaker-Biotronik 06/28/2010   Dizziness and giddiness 04/27/2010   DIVERTICULOSIS, COLON, HX OF 01/19/2009   CAROTID SINUS SYNDROME 11/17/2008   OTHER MALAISE AND FATIGUE 11/17/2008   HYPERTENSION, BENIGN 11/02/2008   ATRIAL TACHYCARDIA 09/16/2008   SYNCOPE, HX OF 08/09/2008   INJURY UNSPEC NERVE SHOULDER GIRDLE&UPPER LIMB 07/12/2008   PERSONAL HISTORY OF MALIGNANT MELANOMA OF SKIN 07/12/2008   LUQ PAIN 12/08/2007   NECK PAIN 09/26/2007   NUMBNESS, ARM 09/26/2007   CPK, ABNORMAL 09/26/2007   ARM PAIN, LEFT 06/25/2007   SWELLING OF LIMB 06/25/2007   OSTEOPENIA 06/25/2007    PCP: Madelin Headings, MD   REFERRING PROVIDER: Kathryne Hitch, MD  REFERRING DIAG:  [phone] (ICD-10-CM) - Status post total replacement of right hip  M87.051 (ICD-10-CM) - Avascular necrosis of bone of right hip   THERAPY DIAG:  Other abnormalities of gait and mobility  Stiffness of right hip, not elsewhere classified  Stiffness of right knee, not elsewhere classified  Acute pain of right knee  Unsteadiness on feet  RATIONALE FOR EVALUATION AND TREATMENT: Rehabilitation  ONSET DATE: 05/18/2022 - R THA, anterior approach    NEXT MD VISIT: 06/28/2022   SUBJECTIVE:  SUBJECTIVE  STATEMENT: Just a little soreness on front of R thigh.   S/P Right total hip replacement on 05/19/22. Has completed HHPT, but would like a few visits to help her transition safely from walker to cane. Total of 5 visits max for this per Dr. Magnus Ivan.   PAIN: Are you having pain? Yes: NPRS scale: 2/10 Pain location: R quads Pain description: aching  Aggravating factors: exercise  Relieving factors: ice  PERTINENT HISTORY:  PMH includes but is not limited to: anxiety, asthma, DM-II, HTN, CVA, DVT, POTS, pacemaker, malignant melanoma with brain, liver and lung metastasis s/p radiation and L parietal craniotomy, osteopenia, diverticulitis, GERD, and Raynaud's disease.   PRECAUTIONS: ICD/Pacemaker and Other: POTS  WEIGHT BEARING RESTRICTIONS: No  FALLS:  Has patient fallen in last 6 months? No  LIVING ENVIRONMENT: Lives with: lives with their spouse Lives in: House/apartment Stairs: Yes: Internal: 13 steps; on right going up, on left going up, and can reach both Has following equipment at home: Single point cane, Walker - 2 wheeled, and shower chair  OCCUPATION: Medically disabled from her cancer  PLOF: Independent and Leisure: walking 3-5 miles, 3-4x/wk (power walking with weights), conditioning with weights  PATIENT GOALS: "Walk out of here normally with no pain and back to normal activity."   OBJECTIVE: (objective measures completed at initial evaluation unless otherwise dated)  DIAGNOSTIC FINDINGS:  05/18/22 - R hip and pelvis x-rays: Status post right hip arthroplasty without evidence of complication.  PATIENT SURVEYS:  LEFS 22 / 80 = 27.5 %  COGNITION: Overall cognitive status: Within functional limits for tasks assessed    SENSATION: Neuropathy in B feet, Raynaud's syndrome  EDEMA:  Mild edema R knee and distal thigh  MUSCLE LENGTH: Hamstrings: mild tight R>L ITB: mod tight R Piriformis: mod tight R Hip flexors: mod tight R Quads: mod tight R Heelcord:  NT  POSTURE:  weight shift left and leg length discrepancy with R LE longer than L  PALPATION: TTP over distal lateral R quads  LOWER EXTREMITY ROM: R hip limited in all directions Active ROM Right eval Left eval  Hip flexion    Hip extension    Hip abduction    Hip adduction    Hip internal rotation    Hip external rotation    Knee flexion    Knee extension -1 0  Ankle dorsiflexion    Ankle plantarflexion    Ankle inversion    Ankle eversion    (Blank rows = not tested)  LOWER EXTREMITY MMT:  MMT Right eval Left eval  Hip flexion 3+ 4-  Hip extension 4- 4-  Hip abduction 4- 4  Hip adduction 4 4  Hip internal rotation 4+ 4+  Hip external rotation 2+ 4  Knee flexion 5 5  Knee extension 4+ 5  Ankle dorsiflexion 5 5  Ankle plantarflexion 5 5  Ankle inversion    Ankle eversion     (Blank rows = not tested)  LOWER EXTREMITY SPECIAL TESTS:  Hip special tests: Ober's test: positive   FUNCTIONAL TESTS:  5 times sit to stand: 20.79 sec (knees together) Timed up and go (TUG): 11.66 sec 10 meter walk test: 10.31 sec w/o AD, 10.93 sec with SPC Functional gait assessment: 16/30, < 19 = high risk fall   GAIT: Distance walked: 20ft w/ SPC and w/o AD Assistive device utilized: Single point cane and None Level of assistance: Modified independence and SBA Gait pattern: step through pattern, decreased stride length, decreased hip/knee flexion-  Right, antalgic, and trendelenburg Comments: limp more pronounced w/o AD  STAIRS: Level of Assistance: SBA Stair Negotiation Technique: Step to Pattern with Single Rail on Right Number of Stairs: 14 x 2  Height of Stairs: 7  Comments: Patient defaulting to step to pattern as training by home health PT, but able to perform 1 flight with reciprocal pattern and SBA of PT   TODAY'S TREATMENT:  07/04/22 Nustep L6x54min 5xSTS- 13 seconds Tandem stance on airex x 30 sec B Toe taps from airex to floor x 10 B Tandem walk 3x10  ft Retro gait 3x10 ft Fwd step and reach 5x B Lateral step and reach 5x B  IASTM with stick to B quads   06/26/22  TherEx   Nustep L5x6 minutes BLEs only   NMR  Tandem stance blue foam pad 3x30 seconds B SLS with one foot on TM 3x30 seconds B 3 way taps off blue foam pad x10 B Cross midline taps on edge of TM blue foam pad x10 B Standing marches x20 on blue foam pad STS from regular chair with feet on blue foam pad 2x10 Lateral cone taps B while standing on blue foam pad x10 Step forwards/backs clearing blue foam pad x10 B        06/19/22  TherEx  Nustep L4x6 minutes BLEs only Lumbar rotation stretches 5x5 seconds B (added to HEP) SKTC 5x5 second holds B (added to HEP)  NMR  Tandem stance blue foam pad 3x30 seconds B  Standing on blue foam pad EC 3x30 seconds  Standing on blue foam pad lateral toe taps x10 B SLS with one foot on 8 inch stool 3x30 seconds B   Selfcare  General course of recovery from [facility] surgery, mechanics/demonstration for safe transfer in/out of high car, POC next session    06/13/22 THERAPEUTIC EXERCISE: to improve flexibility, strength and mobility.  Verbal and tactile cues throughout for technique. Nustep L3x1min R Hooklying HS stretch with strap  x 30" R Supine ITB stretch with strap x 30"   STS x 10 - good knee alignment Bridge with glute set x 10  Seated heel slide and LAQ x 10 RLE Standing clamshells RTB x 10  Standing hip abduction x 10 RTB at counter     06/04/22 Initial eval THERAPEUTIC EXERCISE: to improve flexibility, strength and mobility.  Verbal and tactile cues throughout for technique.  R Hooklying HS stretch with strap  x 30" R Supine ITB stretch with strap x 30"   STS x 5 - cues to keep neutral hip, knee and foot alignment, avoiding knees coming together  PATIENT EDUCATION:  Education details: PT eval findings, anticipated POC, and initial HEP Person educated: Patient and Spouse Education method: Explanation,  Demonstration, Verbal cues, and Handouts Education comprehension: verbalized understanding, returned demonstration, verbal cues required, and needs further education  HOME EXERCISE PROGRAM: Access Code: [email or web address] URL: [email or web address] Date: 06/13/2022 Prepared by: Verta Ellen  Exercises - Hooklying Hamstring Stretch with Strap  - 2 x daily - 7 x weekly - 3 reps - 30 sec hold - Supine Iliotibial Band Stretch with Strap  - 2 x daily - 7 x weekly - 3 reps - 30 sec hold - Sit to Stand  - 2 x daily - 7 x weekly - 2 sets - 10 reps - 3 sec hold - Standing Hamstring Curl with Resistance  - 1 x daily - 7 x weekly - 3 sets - 10 reps - Standing Hip Abduction with Resistance at  Ankles and Counter Support  - 1 x daily - 7 x weekly - 3 sets - 10 reps - Seated Heel Slide  - 1 x daily - 7 x weekly - 3 sets - 10 reps   ASSESSMENT:  CLINICAL IMPRESSION:  Focused treatment today on balance as this is the patients main concern. She demonstrates most deficits on compliant surfaces. LOB noted with stepping fwd and back with arm reach. IASTM done post session to address soreness in thighs.   OBJECTIVE IMPAIRMENTS: Abnormal gait, decreased activity tolerance, decreased balance, decreased coordination, decreased endurance, decreased knowledge of condition, decreased knowledge of use of DME, decreased mobility, difficulty walking, decreased ROM, decreased strength, decreased safety awareness, increased fascial restrictions, impaired perceived functional ability, increased muscle spasms, impaired flexibility, improper body mechanics, postural dysfunction, and pain.   ACTIVITY LIMITATIONS: carrying, lifting, bending, sitting, standing, squatting, sleeping, stairs, transfers, bed mobility, bathing, and locomotion level  PARTICIPATION LIMITATIONS: meal prep, cleaning, laundry, driving, shopping, and community activity  PERSONAL FACTORS: Past/current experiences, Time since onset of  injury/illness/exacerbation, and 3+ comorbidities: anxiety, asthma, DM-II, HTN, CVA, DVT, POTS, pacemaker, malignant melanoma with brain, liver and lung metastasis s/p radiation and L parietal craniotomy, osteopenia, diverticulitis, GERD, and Raynaud's disease  are also affecting patient's functional outcome.   REHAB POTENTIAL: Good  CLINICAL DECISION MAKING: Evolving/moderate complexity  EVALUATION COMPLEXITY: Moderate   GOALS: Goals reviewed with patient? Yes  SHORT TERM GOALS: Target date: 06/25/2022   Patient will be independent with initial HEP. Baseline: Patient currently performing HH PT HEP Goal status: MET- 07/04/22  2.  Patient will improve 5x STS time to </= 15 seconds for improved efficiency and safety with transfers Baseline: 20.79 sec Goal status: MET- 07/04/22  LONG TERM GOALS: Target date: 07/16/2022   Patient will be independent with advanced/ongoing HEP to improve outcomes and carryover.  Baseline:  Goal status: IN PROGRESS  2.  Patient will report at least 50-75% improvement in R hip and knee pain to improve QOL. Baseline:  Goal status: IN PROGRESS  3.  Patient will demonstrate improved proximal knee LE strength to >/= 4+/5 for improved stability and ease of mobility. Baseline: Refer to above MMT table Goal status: IN PROGRESS  4.  Patient will be able to ambulate 600' with or w/o SPC and normal gait pattern without increased pain to access community.  Baseline:  Goal status: IN PROGRESS  5.  Patient will report >/= 31/80 on LEFS to demonstrate improved functional ability. Baseline: 22 / 80 = 27.5 % Goal status: IN PROGRESS  6.  Patient will demonstrate at least 20/24 on FGA to decrease risk of falls. Baseline: 16/30 Goal status: IN PROGRESS    PLAN:  PT FREQUENCY: 1-2x/week (Total of 5 visits max for this per Dr. Magnus Ivan)   PT DURATION: 6 weeks  PLANNED INTERVENTIONS: Therapeutic exercises, Therapeutic activity, Neuromuscular re-education, Balance  training, Gait training, Patient/Family education, Self Care, Joint mobilization, Stair training, DME instructions, Dry Needling, Electrical stimulation, Cryotherapy, Moist heat, Taping, Vasopneumatic device, Ultrasound, Ionotophoresis 4mg /ml Dexamethasone, Manual therapy, and Re-evaluation  PLAN FOR NEXT SESSION: reassess next visit, possible DC to advanced HEP with balance additions?   Verta Ellen, PTA

## 2022-07-05 ENCOUNTER — Telehealth: Payer: Self-pay | Admitting: *Deleted

## 2022-07-05 ENCOUNTER — Ambulatory Visit (HOSPITAL_COMMUNITY)
Admission: RE | Admit: 2022-07-05 | Discharge: 2022-07-05 | Disposition: A | Discharge status: Home / Self Care | Payer: Medicare Other | Source: Ambulatory Visit | Attending: Neurological Surgery | Admitting: Neurological Surgery

## 2022-07-05 DIAGNOSIS — C7931 Secondary malignant neoplasm of brain: Secondary | ICD-10-CM | POA: Diagnosis not present

## 2022-07-05 DIAGNOSIS — G9389 Other specified disorders of brain: Secondary | ICD-10-CM | POA: Diagnosis not present

## 2022-07-05 DIAGNOSIS — C7949 Secondary malignant neoplasm of other parts of nervous system: Secondary | ICD-10-CM | POA: Insufficient documentation

## 2022-07-05 MED ORDER — GADOBENATE DIMEGLUMINE 529 MG/ML IV SOLN
14.0000 mL | Freq: Once | INTRAVENOUS | Status: AC | PRN
  Administered 2022-07-05: 14 mL via INTRAVENOUS

## 2022-07-05 NOTE — Telephone Encounter (Signed)
Ortho bundle call completed. 

## 2022-07-06 DIAGNOSIS — C7949 Secondary malignant neoplasm of other parts of nervous system: Secondary | ICD-10-CM | POA: Diagnosis not present

## 2022-07-09 ENCOUNTER — Ambulatory Visit: Payer: Medicare Other

## 2022-07-09 DIAGNOSIS — M25661 Stiffness of right knee, not elsewhere classified: Secondary | ICD-10-CM

## 2022-07-09 DIAGNOSIS — R2681 Unsteadiness on feet: Secondary | ICD-10-CM | POA: Diagnosis not present

## 2022-07-09 DIAGNOSIS — M25651 Stiffness of right hip, not elsewhere classified: Secondary | ICD-10-CM

## 2022-07-09 DIAGNOSIS — R2689 Other abnormalities of gait and mobility: Secondary | ICD-10-CM | POA: Diagnosis not present

## 2022-07-09 DIAGNOSIS — M25561 Pain in right knee: Secondary | ICD-10-CM

## 2022-07-09 NOTE — Therapy (Addendum)
OUTPATIENT PHYSICAL THERAPY TREATMENT AND RECERTIFICATION   Patient Name: [name] MRN: [id number] DOB:09/10/1955, 67 y.o., female Today's Date: 07/09/2022   END OF SESSION:  PT End of Session - 07/09/22 1402     Visit Number 6    Number of Visits 6    Date for PT Re-Evaluation 08/20/22    Authorization Type [employer] Medicare    PT Start Time 1316    PT Stop Time 1359    PT Time Calculation (min) 43 min    Activity Tolerance Patient tolerated treatment well    Behavior During Therapy WFL for tasks assessed/performed                 Past Medical History:  Diagnosis Date   Anemia    Angio-edema    Anxiety    Arthritis    Asthma    Atrial fibrillation (HCC)    Atrial tachycardia    Autonomic dysfunction    Complication of anesthesia    pt states wakes up with "shakes"   CVA (cerebral infarction)    2012 with dizziness and vision change felt embolic from atrial tachy   Disorder of bone and cartilage, unspecified    Diverticulosis    DM (diabetes mellitus) (HCC)    DVT (deep venous thrombosis) (HCC)    Eczema    Family history of anesthesia complication    PONV and " shaking "   Fatty liver    GERD (gastroesophageal reflux disease)    History of hiatal hernia    History of radiation therapy 10/24/2012   brain   HLD (hyperlipidemia)    HTN (hypertension)    Hypoglycemia, unspecified    Liver metastasis    Lung metastasis    Metastasis to brain (HCC)    Osteopenia    Other nonspecific abnormal serum enzyme levels    Other specified congenital anomalies of nervous system    Pacemaker    autonomic dysfunction   Pancreatitis    from therapy   Peripheral vascular disease (HCC)    POTS (postural orthostatic tachycardia syndrome)    Raynaud's disease    due to chemo   Skin cancer    Hx: of lung lesion   Past Surgical History:  Procedure Laterality Date   ABLATION SAPHENOUS VEIN W/ RFA     2002 x3   CARPAL TUNNEL RELEASE Left 2000    CHOLECYSTECTOMY N/A 03/28/2018   Procedure: LAPAROSCOPIC CHOLECYSTECTOMY WITH INTRAOPERATIVE CHOLANGIOGRAM;  Surgeon: Luretha Murphy, MD;  Location: [facility] OR;  Service: General;  Laterality: N/A;   CRANIOTOMY N/A 09/30/2012   suboccipital craniectomy   CRANIOTOMY Left 02/09/2013   Procedure: LEFT PARIETAL CRANIOTOMY with stealth;  Surgeon: Barnett Abu, MD;  Location: [facility];  Service: Neurosurgery;  Laterality: Left;  LEFT Parietal Craniotomy for tumor with stealth   DEEP AXILLARY SENTINEL NODE BIOPSY / EXCISION     due to extensive Melanoma-right arm   fatty tumor removed  2000   from chest   INSERT / REPLACE / REMOVE PACEMAKER  2012   1999, x 3   LAPAROSCOPY  09/06/2011   Procedure: LAPAROSCOPY OPERATIVE;  Surgeon: Tresa Endo A. Ernestina Penna, MD;  Location: [facility] ORS;  Service: Gynecology;  Laterality: Left;  with Left Ovarian Cystectomy    MELANOMA EXCISION     with removal of lymph nodes, left shoulder   NOSE SURGERY  2010, 2012   for nose bleeds x 2   SINOSCOPY     TONSILLECTOMY AND ADENOIDECTOMY  TOTAL HIP ARTHROPLASTY Right 05/18/2022   Procedure: RIGHT TOTAL HIP ARTHROPLASTY ANTERIOR APPROACH;  Surgeon: Kathryne Hitch, MD;  Location: [facility] ORS;  Service: Orthopedics;  Laterality: Right;   Patient Active Problem List   Diagnosis Date Noted   Status post total replacement of right hip 05/18/2022   Avascular necrosis of bone of right hip (HCC) 04/19/2022   Moderate persistent asthma 12/08/2018   Perennial allergic rhinitis with predominantly nonallergic component 12/08/2018   Allergic conjunctivitis 12/08/2018   History of epistaxis 12/08/2018   Atopic dermatitis 12/08/2018   Recurrent urticaria 12/08/2018   Skeeter syndrome 12/08/2018   Skin lesion of chest wall 07/22/2018   S/P laparoscopic cholecystectomy 03/28/2018   Dehydration 05/18/2016   Type 2 diabetes mellitus without complication, without long-term current use of insulin (HCC) 11/01/2015   History of pacemaker     POTS (postural orthostatic tachycardia syndrome)    Iron deficiency anemia 11/11/2014   Chronic colitis 08/28/2013   Malignant melanoma, metastatic (HCC) 02/09/2013   Malignant melanoma (HCC) 12/01/2012   Brain metastasis 10/16/2012   Hypertension 09/30/2012   Anxiety state, unspecified 09/30/2012   Hypoxemia 09/30/2012   Disorder of brain 09/08/2012   Dysautonomia (HCC) 02/25/2011   Anticoagulant long-term use 01/19/2011   Neuritis 01/19/2011   Skin change 01/19/2011   CVA (cerebral infarction)    Palpitation 06/28/2010   Pacemaker-Biotronik 06/28/2010   Dizziness and giddiness 04/27/2010   DIVERTICULOSIS, COLON, HX OF 01/19/2009   CAROTID SINUS SYNDROME 11/17/2008   OTHER MALAISE AND FATIGUE 11/17/2008   HYPERTENSION, BENIGN 11/02/2008   ATRIAL TACHYCARDIA 09/16/2008   SYNCOPE, HX OF 08/09/2008   INJURY UNSPEC NERVE SHOULDER GIRDLE&UPPER LIMB 07/12/2008   PERSONAL HISTORY OF MALIGNANT MELANOMA OF SKIN 07/12/2008   LUQ PAIN 12/08/2007   NECK PAIN 09/26/2007   NUMBNESS, ARM 09/26/2007   CPK, ABNORMAL 09/26/2007   ARM PAIN, LEFT 06/25/2007   SWELLING OF LIMB 06/25/2007   OSTEOPENIA 06/25/2007    PCP: Madelin Headings, MD   REFERRING PROVIDER: Kathryne Hitch, MD  REFERRING DIAG:  [id number] (ICD-10-CM) - Status post total replacement of right hip  M87.051 (ICD-10-CM) - Avascular necrosis of bone of right hip   THERAPY DIAG:  Other abnormalities of gait and mobility  Stiffness of right hip, not elsewhere classified  Stiffness of right knee, not elsewhere classified  Acute pain of right knee  Unsteadiness on feet  RATIONALE FOR EVALUATION AND TREATMENT: Rehabilitation  ONSET DATE: 05/18/2022 - R THA, anterior approach    NEXT MD VISIT: 06/28/2022   SUBJECTIVE:  SUBJECTIVE STATEMENT: Went to church yesterday with wedges on, it was hard to walk back to car.    S/P Right total hip replacement on 05/19/22. Has completed HHPT, but would like a few visits to help her transition safely from walker to cane. Total of 5 visits max for this per Dr. Magnus Ivan.   PAIN: Are you having pain? Yes: NPRS scale: 8 (yesterday leaving from church) /10 Pain location: R quads Pain description: aching  Aggravating factors: exercise  Relieving factors: ice  PERTINENT HISTORY:  PMH includes but is not limited to: anxiety, asthma, DM-II, HTN, CVA, DVT, POTS, pacemaker, malignant melanoma with brain, liver and lung metastasis s/p radiation and L parietal craniotomy, osteopenia, diverticulitis, GERD, and Raynaud's disease.   PRECAUTIONS: ICD/Pacemaker and Other: POTS  WEIGHT BEARING RESTRICTIONS: No  FALLS:  Has patient fallen in last 6 months? No  LIVING ENVIRONMENT: Lives with: lives with their spouse Lives in: House/apartment Stairs: Yes: Internal: 13 steps; on right going up, on left going up, and can reach both Has following equipment at home: Single point cane, Walker - 2 wheeled, and shower chair  OCCUPATION: Medically disabled from her cancer  PLOF: Independent and Leisure: walking 3-5 miles, 3-4x/wk (power walking with weights), conditioning with weights  PATIENT GOALS: "Walk out of here normally with no pain and back to normal activity."   OBJECTIVE: (objective measures completed at initial evaluation unless otherwise dated)  DIAGNOSTIC FINDINGS:  05/18/22 - R hip and pelvis x-rays: Status post right hip arthroplasty without evidence of complication.  PATIENT SURVEYS:  LEFS 22 / 80 = 27.5 %  COGNITION: Overall cognitive status: Within functional limits for tasks assessed    SENSATION: Neuropathy in B feet, Raynaud's syndrome  EDEMA:  Mild edema R knee and distal thigh  MUSCLE LENGTH: Hamstrings: mild tight R>L ITB: mod tight  R Piriformis: mod tight R Hip flexors: mod tight R Quads: mod tight R Heelcord: NT  POSTURE:  weight shift left and leg length discrepancy with R LE longer than L  PALPATION: TTP over distal lateral R quads  LOWER EXTREMITY ROM: R hip limited in all directions Active ROM Right eval Left eval  Hip flexion    Hip extension    Hip abduction    Hip adduction    Hip internal rotation    Hip external rotation    Knee flexion    Knee extension -1 0  Ankle dorsiflexion    Ankle plantarflexion    Ankle inversion    Ankle eversion    (Blank rows = not tested)  LOWER EXTREMITY MMT:  MMT Right eval Left eval Right 07/09/22   Hip flexion 3+ 4- 4  Hip extension 4- 4- 4  Hip abduction 4- 4 4  Hip adduction 4 4 4+  Hip internal rotation 4+ 4+   Hip external rotation 2+ 4   Knee flexion 5 5   Knee extension 4+ 5 4  Ankle dorsiflexion 5 5   Ankle plantarflexion 5 5   Ankle inversion     Ankle eversion      (Blank rows = not tested)  LOWER EXTREMITY SPECIAL TESTS:  Hip special tests: Ober's test: positive   FUNCTIONAL TESTS:  5 times sit to stand: 20.79 sec (knees together) Timed up and go (TUG): 11.66 sec 10 meter walk test: 10.31 sec w/o AD, 10.93 sec with SPC Functional gait assessment: 16/30, < 19 = high risk fall   GAIT: Distance walked: 45ft w/ SPC and w/o AD  Assistive device utilized: Single point cane and None Level of assistance: Modified independence and SBA Gait pattern: step through pattern, decreased stride length, decreased hip/knee flexion- Right, antalgic, and trendelenburg Comments: limp more pronounced w/o AD  STAIRS: Level of Assistance: SBA Stair Negotiation Technique: Step to Pattern with Single Rail on Right Number of Stairs: 14 x 2  Height of Stairs: 7  Comments: Patient defaulting to step to pattern as training by home health PT, but able to perform 1 flight with reciprocal pattern and SBA of PT   TODAY'S TREATMENT:  07/09/22 Nustep  L5x39min Supine isometric hip 10x5" FGA: Functional Gait Assessment: 25 / 30 = 83.3 % Strength test R LE Assessed goals for recert  07/04/22 Nustep L6x23min 5xSTS- 13 seconds Tandem stance on airex x 30 sec B Toe taps from airex to floor x 10 B Tandem walk 3x10 ft Retro gait 3x10 ft Fwd step and reach 5x B Lateral step and reach 5x B  IASTM with stick to B quads   06/26/22  TherEx   Nustep L5x6 minutes BLEs only   NMR  Tandem stance blue foam pad 3x30 seconds B SLS with one foot on TM 3x30 seconds B 3 way taps off blue foam pad x10 B Cross midline taps on edge of TM blue foam pad x10 B Standing marches x20 on blue foam pad STS from regular chair with feet on blue foam pad 2x10 Lateral cone taps B while standing on blue foam pad x10 Step forwards/backs clearing blue foam pad x10 B        06/19/22  TherEx  Nustep L4x6 minutes BLEs only Lumbar rotation stretches 5x5 seconds B (added to HEP) SKTC 5x5 second holds B (added to HEP)  NMR  Tandem stance blue foam pad 3x30 seconds B  Standing on blue foam pad EC 3x30 seconds  Standing on blue foam pad lateral toe taps x10 B SLS with one foot on 8 inch stool 3x30 seconds B   Selfcare  General course of recovery from [facility] surgery, mechanics/demonstration for safe transfer in/out of high car, POC next session    06/13/22 THERAPEUTIC EXERCISE: to improve flexibility, strength and mobility.  Verbal and tactile cues throughout for technique. Nustep L3x58min R Hooklying HS stretch with strap  x 30" R Supine ITB stretch with strap x 30"   STS x 10 - good knee alignment Bridge with glute set x 10  Seated heel slide and LAQ x 10 RLE Standing clamshells RTB x 10  Standing hip abduction x 10 RTB at counter     06/04/22 Initial eval THERAPEUTIC EXERCISE: to improve flexibility, strength and mobility.  Verbal and tactile cues throughout for technique.  R Hooklying HS stretch with strap  x 30" R Supine ITB stretch with  strap x 30"   STS x 5 - cues to keep neutral hip, knee and foot alignment, avoiding knees coming together  PATIENT EDUCATION:  Education details: PT eval findings, anticipated POC, and initial HEP Person educated: Patient and Spouse Education method: Explanation, Demonstration, Verbal cues, and Handouts Education comprehension: verbalized understanding, returned demonstration, verbal cues required, and needs further education  HOME EXERCISE PROGRAM: Access Code: [email or web address] URL: [email or web address] Date: 06/13/2022 Prepared by: Verta Ellen  Exercises - Hooklying Hamstring Stretch with Strap  - 2 x daily - 7 x weekly - 3 reps - 30 sec hold - Supine Iliotibial Band Stretch with Strap  - 2 x daily - 7 x weekly - 3 reps - 30 sec  hold - Sit to Stand  - 2 x daily - 7 x weekly - 2 sets - 10 reps - 3 sec hold - Standing Hamstring Curl with Resistance  - 1 x daily - 7 x weekly - 3 sets - 10 reps - Standing Hip Abduction with Resistance at Ankles and Counter Support  - 1 x daily - 7 x weekly - 3 sets - 10 reps - Seated Heel Slide  - 1 x daily - 7 x weekly - 3 sets - 10 reps   ASSESSMENT:  CLINICAL IMPRESSION:  TIAHNA SHIERS has progressed with gait and RLE strength. She is able to walk for 2-3 miles per day for exercise meeting LTG #4. She has also met her goal for FGA meeting LTG #6. She has met goal for improvement in pain (LTG #2). She is still having weakness in her R hip/knee along with having balance deficits causing swaying to the left. She would continue to benefit from skilled therapy to address the weakness and balance to improve overall function. I recommend her continuing 1x/wk for 6 weeks.   OBJECTIVE IMPAIRMENTS: Abnormal gait, decreased activity tolerance, decreased balance, decreased coordination, decreased endurance, decreased knowledge of condition, decreased knowledge of use of DME, decreased mobility, difficulty walking, decreased ROM, decreased strength,  decreased safety awareness, increased fascial restrictions, impaired perceived functional ability, increased muscle spasms, impaired flexibility, improper body mechanics, postural dysfunction, and pain.   ACTIVITY LIMITATIONS: carrying, lifting, bending, sitting, standing, squatting, sleeping, stairs, transfers, bed mobility, bathing, and locomotion level  PARTICIPATION LIMITATIONS: meal prep, cleaning, laundry, driving, shopping, and community activity  PERSONAL FACTORS: Past/current experiences, Time since onset of injury/illness/exacerbation, and 3+ comorbidities: anxiety, asthma, DM-II, HTN, CVA, DVT, POTS, pacemaker, malignant melanoma with brain, liver and lung metastasis s/p radiation and L parietal craniotomy, osteopenia, diverticulitis, GERD, and Raynaud's disease  are also affecting patient's functional outcome.   REHAB POTENTIAL: Good  CLINICAL DECISION MAKING: Evolving/moderate complexity  EVALUATION COMPLEXITY: Moderate   GOALS: Goals reviewed with patient? Yes  SHORT TERM GOALS: Target date: 06/25/2022   Patient will be independent with initial HEP. Baseline: Patient currently performing HH PT HEP Goal status: MET- 07/04/22  2.  Patient will improve 5x STS time to </= 15 seconds for improved efficiency and safety with transfers Baseline: 20.79 sec Goal status: MET- 07/04/22  LONG TERM GOALS: Target date: 07/16/2022 extending to 08/20/22   Patient will be independent with advanced/ongoing HEP to improve outcomes and carryover.  Baseline:  Goal status: IN PROGRESS  2.  Patient will report at least 50-75% improvement in R hip and knee pain to improve QOL. Baseline:  Goal status: MET- 07/09/22  3.  Patient will demonstrate improved proximal knee LE strength to >/= 4+/5 for improved stability and ease of mobility. Baseline: Refer to above MMT table Goal status: IN PROGRESS- 07/09/22  4.  Patient will be able to ambulate 600' with or w/o SPC and normal gait pattern without  increased pain to access community.  Baseline:  Goal status: MET- 07/09/22   5.  Patient will report >/= 31/80 on LEFS to demonstrate improved functional ability. Baseline: 22 / 80 = 27.5 % Goal status: IN PROGRESS  6.  Patient will demonstrate at least 20/24 on FGA to decrease risk of falls. Baseline: 16/30 Goal status: MET- 5/02/28/22- 25/30 FGA    PLAN:  PT FREQUENCY: 1x/week (Total of 5 visits max for this per Dr. Magnus Ivan originally)   PT DURATION: 6 weeks  PLANNED INTERVENTIONS: Therapeutic  exercises, Therapeutic activity, Neuromuscular re-education, Balance training, Gait training, Patient/Family education, Self Care, Joint mobilization, Stair training, DME instructions, Dry Needling, Electrical stimulation, Cryotherapy, Moist heat, Taping, Vasopneumatic device, Ultrasound, Ionotophoresis 4mg /ml Dexamethasone, Manual therapy, and Re-evaluation  PLAN FOR NEXT SESSION: progress dynamic balance activities; narrow BOS exercises; progress hip strength  Verta Ellen, PTA

## 2022-07-09 NOTE — Addendum Note (Signed)
Addended by: Gearlean Alf on: 07/09/2022 03:22 PM   Modules accepted: Orders

## 2022-07-12 ENCOUNTER — Ambulatory Visit: Payer: Medicare Other | Admitting: Physical Therapy

## 2022-07-12 ENCOUNTER — Telehealth: Payer: Self-pay | Admitting: *Deleted

## 2022-07-12 NOTE — Telephone Encounter (Signed)
Patient called to ask about pain she is now having in the left groin and hip area. She asked if it could be from leg length discrepancy or over-compensation on one side vs. The other. She did get an insert, but not sure how much she is wearing. She is attending OPPT as well. Was taking the Ibuprofen prescribed, but I think only taking 1/2 dose at a time and states she has history of stomach issues since chemo, so it was really bothering her stomach. She has stopped. Anything else you can recommend? I encouraged her to continue with icing, use of orthotic, and therapy. She asked if her left hip x-rays looked ok and I wasn't sure your previous conversations about AVN or not in this hip. Thought I'd defer to you for that. Thanks.

## 2022-07-13 ENCOUNTER — Other Ambulatory Visit: Payer: Self-pay

## 2022-07-13 DIAGNOSIS — M25552 Pain in left hip: Secondary | ICD-10-CM

## 2022-07-13 NOTE — Telephone Encounter (Signed)
Referral sent for MRI 

## 2022-07-17 ENCOUNTER — Telehealth: Payer: Self-pay | Admitting: Orthopaedic Surgery

## 2022-07-17 DIAGNOSIS — Z800 Family history of malignant neoplasm of digestive organs: Secondary | ICD-10-CM | POA: Diagnosis not present

## 2022-07-17 DIAGNOSIS — K580 Irritable bowel syndrome with diarrhea: Secondary | ICD-10-CM | POA: Diagnosis not present

## 2022-07-17 DIAGNOSIS — Z79899 Other long term (current) drug therapy: Secondary | ICD-10-CM | POA: Diagnosis not present

## 2022-07-17 DIAGNOSIS — C439 Malignant melanoma of skin, unspecified: Secondary | ICD-10-CM | POA: Diagnosis not present

## 2022-07-17 DIAGNOSIS — Z96641 Presence of right artificial hip joint: Secondary | ICD-10-CM | POA: Diagnosis not present

## 2022-07-17 DIAGNOSIS — C787 Secondary malignant neoplasm of liver and intrahepatic bile duct: Secondary | ICD-10-CM | POA: Diagnosis not present

## 2022-07-17 DIAGNOSIS — E119 Type 2 diabetes mellitus without complications: Secondary | ICD-10-CM | POA: Diagnosis not present

## 2022-07-17 DIAGNOSIS — C4361 Malignant melanoma of right upper limb, including shoulder: Secondary | ICD-10-CM | POA: Diagnosis not present

## 2022-07-17 NOTE — Telephone Encounter (Signed)
FYI.Marland KitchenMarland KitchenMarland KitchenMarland KitchenPatient called in stating [facility] did a CT scan and it shows she has Post changes with underlining fluid collection, Hematoma, part of the incision was very red.

## 2022-07-18 ENCOUNTER — Other Ambulatory Visit: Payer: Self-pay | Admitting: Orthopaedic Surgery

## 2022-07-18 ENCOUNTER — Telehealth: Payer: Self-pay

## 2022-07-18 DIAGNOSIS — T8130XA Disruption of wound, unspecified, initial encounter: Secondary | ICD-10-CM

## 2022-07-18 MED ORDER — CEPHALEXIN 500 MG PO CAPS: 500.0000 mg | ORAL_CAPSULE | Freq: Two times a day (BID) | ORAL | 0 refills | Status: DC

## 2022-07-18 MED ORDER — MUPIROCIN 2 % EX OINT: TOPICAL_OINTMENT | Freq: Every day | CUTANEOUS | Status: AC

## 2022-07-18 NOTE — Telephone Encounter (Signed)
Pt called and stated she wanted to talk to Dr. Magdalene Molly was the other message from this morning Patient called in stating [facility] did a CT scan and it shows she has Post changes with underlining fluid collection, Hematoma, part of the incision was very red.

## 2022-07-19 ENCOUNTER — Ambulatory Visit: Payer: Medicare Other

## 2022-07-19 NOTE — Telephone Encounter (Signed)
Called and worked in 

## 2022-07-19 NOTE — Telephone Encounter (Signed)
[name] which day do you think is better? Ill call her

## 2022-07-25 ENCOUNTER — Telehealth: Payer: Self-pay | Admitting: *Deleted

## 2022-07-25 ENCOUNTER — Ambulatory Visit (INDEPENDENT_AMBULATORY_CARE_PROVIDER_SITE_OTHER): Payer: Medicare Other | Admitting: Orthopaedic Surgery

## 2022-07-25 DIAGNOSIS — Z96641 Presence of right artificial hip joint: Secondary | ICD-10-CM

## 2022-07-25 DIAGNOSIS — M2170 Unequal limb length (acquired), unspecified site: Secondary | ICD-10-CM

## 2022-07-25 NOTE — Telephone Encounter (Signed)
Ortho bundle 60 day in office meeting completed. 

## 2022-07-25 NOTE — Progress Notes (Signed)
[name] is now just past 2 months status post a right total hip arthroplasty secondary to avascular necrosis.  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  She has a CT scan that has been done and she is going to get me that disc at some point.  The radiologist read it as a small area of AVN but no femoral head collapse.  On exam she does have a small area where there is a retained suture that we removed.  It is looking better overall in terms of the redness that she had had that she reported.  That is gone and she has been on Keflex.  She has 3 more days of Keflex we will have her continue that.  I did remove again the retained suture and we are treating this with just mupirocin ointment.  When I have her lay supine she does have a leg length difference with the right side longer than the left.  I have given her a prescription to take to [facility] for a custom insert for her left side.  From my standpoint, the next time I need to see her is 3 months unless there are issues.  Will have a standing AP pelvis and a lateral of both hips at that visit secondary to having AVN on the left side.  If she does have worsening issues she will let us know and again she will try to drop off the CD that shows her left hip so I can actually see that imaging study.

## 2022-07-30 ENCOUNTER — Ambulatory Visit: Payer: Medicare Other | Admitting: Physical Therapy

## 2022-08-02 ENCOUNTER — Telehealth: Payer: Self-pay

## 2022-08-02 NOTE — Progress Notes (Signed)
Care Management & Coordination Services Pharmacy Team  Reason for Encounter: General adherence update   Contacted patient for general health update and medication adherence call.  Spoke with patient on 08/03/2022    How is your hip healing? Patient states she is doing fairly well overall. She is walking 3-4 miles daily with out assistance  What concerns do you have about your medications? Patient denies any at this time  The patient denies side effects with their medications.   Are you having any problems getting your medications from your pharmacy?  Patient denies  Since last visit with PharmD, no interventions have been made.   The patient has not had an ED visit since last contact.   The patient denies any new problems with her health at this time.  Patient denies concerns or questions for Delano Metz, PharmD at this time.    Care Gaps: AWV - completed 03/15/2022 Last eye exam - never done Last foot exam - never done Last BP - 110/70 on 05/17/2022 Last A1C - 5.7 on 05/08/2022    Star Rating Drugs: None  Chart Updates:  Recent office visits:  None  Recent consult visits:  07/25/2022 Doneen Poisson MD (ortho) - Patient was seen for Status post total replacement of right hip and an additional concern. No medication changes.   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 No medication changes.   06/28/2022 Doneen Poisson MD (ortho) - Patient was seen for Status post total replacement of right hip. No medication changes.   06/25/2022 Barnett Abu MD (neuro sx) - Patient was seen for Secondary malignant neoplasm of brain and spinal cord. No additional chart notes.   06/13/2022 Doneen Poisson MD (ortho) - Patient was seen for Status post total replacement of right hip. No medication changes.  06/13/2022 Braylin Clark PTA (PT) - Patient was seen for Stiffness of right hip, not elsewhere  classified and additional concerns. No medication changes.   Hospital visits:  None  Medications: Outpatient Encounter Medications as of 08/02/2022  Medication Sig   acetaminophen (TYLENOL) 500 MG tablet Take 500 mg by mouth See admin instructions. Take 500 mg by mouth at bedtime and an additional 500 mg three times a day as needed for pain   albuterol (PROVENTIL) (2.5 MG/3ML) 0.083% nebulizer solution Take 3 mLs (2.5 mg total) by nebulization every 4 (four) hours as needed for wheezing or shortness of breath.   albuterol (VENTOLIN HFA) 108 (90 Base) MCG/ACT inhaler INHALE 2 PUFFS INTO THE LUNGS EVERY 4 (FOUR) HOURS AS NEEDED FOR WHEEZING OR SHORTNESS OF BREATH.   Ascorbic Acid (VITAMIN C) 1000 MG tablet Take 2,000 mg by mouth daily.   aspirin 81 MG chewable tablet Chew 1 tablet (81 mg total) by mouth 2 (two) times daily.   augmented betamethasone dipropionate (DIPROLENE) 0.05 % ointment Apply topically 2 (two) times daily. Limit to no longer than 2 weeks use. Not on face. (Patient taking differently: Apply 1 Application topically 2 (two) times daily as needed (hives). Limit to no longer than 2 weeks use. Not on face.)   benzonatate (TESSALON) 200 MG capsule Take 1 capsule (200 mg total) by mouth 2 (two) times daily as needed for cough.   budesonide (ENTOCORT EC) 3 MG 24 hr capsule Take 3 mg by mouth as needed for rash.   calcium carbonate (TUMS EX) 750 MG chewable tablet Chew 1 tablet by mouth daily.   cephALEXin (KEFLEX) 500 MG capsule Take  1 capsule (500 mg total) by mouth 2 (two) times daily.   clobetasol cream (TEMOVATE) 0.05 % Apply 1 application topically 2 (two) times daily as needed (rash).   clonazePAM (KLONOPIN) 1 MG tablet TAKE 1 TABLET BY MOUTH TWICE A DAY MAY TAKE UP TO 3 TIMES DAILY IF NEEDED FOR EXTRA ANXIETY (Patient taking differently: Take 1 mg by mouth See admin instructions. Take 1 mg by mouth at 8 AM and an additional 1 mg up to two times a day as needed for anxiety)    cloNIDine (CATAPRES) 0.1 MG tablet Take 1 tablet (0.1 mg total) by mouth at bedtime. (Patient taking differently: Take 0.1 mg by mouth See admin instructions. Take 0.1 mg by mouth at 8 PM)   cyanocobalamin (VITAMIN B12) 1000 MCG/ML injection INJECT 1 ML (1,000 MCG TOTAL) INTO THE MUSCLE EVERY 30 DAYS.   Desoximetasone (TOPICORT) 0.25 % ointment Apply 1 application topically 2 (two) times daily. (Patient taking differently: Apply 1 application  topically 2 (two) times daily as needed (rash).)   diphenhydrAMINE (BENADRYL) 25 MG tablet Take 25-50 mg by mouth daily as needed for allergies.   EPINEPHrine 0.3 mg/0.3 mL IJ SOAJ injection Inject 0.3 mg into the muscle as needed for anaphylaxis.   fexofenadine (ALLEGRA) 180 MG tablet Take 180 mg by mouth daily as needed for allergies or rhinitis.   fluticasone (FLONASE) 50 MCG/ACT nasal spray Place 2 sprays into both nostrils daily. (Patient taking differently: Place 2 sprays into both nostrils daily as needed for allergies.)   HYDROcodone-acetaminophen (NORCO/VICODIN) 5-325 MG tablet Take 1-2 tablets by mouth every 6 (six) hours as needed for moderate pain.   hydrocortisone (ANUSOL-HC) 25 MG suppository Place 25 mg rectally as needed for hemorrhoids.   ibuprofen (ADVIL) 800 MG tablet Take 1 tablet (800 mg total) by mouth every 8 (eight) hours as needed.   labetalol (NORMODYNE) 100 MG tablet Take 0.5 tablets (50 mg total) by mouth 2 (two) times daily.   levETIRAcetam (KEPPRA) 250 MG tablet Take 250 mg by mouth See admin instructions. Take 250 mg by mouth at 8 AM, 2 PM, and 8 PM   levocetirizine (XYZAL) 5 MG tablet TAKE 1 TABLET BY MOUTH EVERY DAY AS NEEDED   lidocaine (XYLOCAINE) 2 % solution 5mL swish and swallow as needed for sore throat; take with nystatin susp   methocarbamol (ROBAXIN) 500 MG tablet Take 1 tablet (500 mg total) by mouth every 6 (six) hours as needed for muscle spasms.   metoprolol tartrate (LOPRESSOR) 25 MG tablet Take 25 mg by mouth See  admin instructions. Take 25 mg by mouth at 8 AM and 2 PM   nystatin (MYCOSTATIN) 100000 UNIT/ML suspension Use as directed 5 mLs (500,000 Units total) in the mouth or throat 3 (three) times daily as needed (thrush). (Patient taking differently: Use as directed 3 mLs in the mouth or throat 3 (three) times daily as needed (thrush).)   Olopatadine HCl (PATADAY) 0.2 % SOLN Use one drop in each eye once daily as needed. (Patient taking differently: Place 1 drop into both eyes daily as needed (itchy eyes).)   ondansetron (ZOFRAN-ODT) 4 MG disintegrating tablet Take 4 mg by mouth every 8 (eight) hours as needed for nausea/vomiting.   oxyCODONE (OXY IR/ROXICODONE) 5 MG immediate release tablet Take 1-2 tablets (5-10 mg total) by mouth every 4 (four) hours as needed for moderate pain (pain score 4-6).   polyethylene glycol (MIRALAX / GLYCOLAX) packet Take 17 g by mouth at bedtime.  potassium chloride SA (KLOR-CON M) 20 MEQ tablet Take 1 tablet (20 mEq total) by mouth 2 (two) times daily. (Patient taking differently: Take 20 mEq by mouth See admin instructions. Take 20 mEq by mouth at 8 AM and 2 PM)   Probiotic Product (PROBIOTIC PO) Take 2 capsules by mouth daily.   prochlorperazine (COMPAZINE) 10 MG tablet Take 1 tablet (10 mg total) by mouth every 6 (six) hours as needed for nausea or vomiting.   sodium chloride (OCEAN) 0.65 % SOLN nasal spray Place 1 spray into both nostrils 2 (two) times daily.   Spacer/Aero-Holding Chambers [facility] DIAMOND) MISC See admin instructions.   Syringe/Needle, Disp, (SYRINGE 3CC/27GX1-1/4") 27G X 1-1/4" 3 ML MISC Inject 1 mL into the muscle every 30 (thirty) days. Vit B12   tiZANidine (ZANAFLEX) 2 MG tablet Take 1 tablet (2 mg total) by mouth every 6 (six) hours as needed for muscle spasms.   Vitamin D, Ergocalciferol, (DRISDOL) 50000 units CAPS capsule TAKE 1 CAPSULE (50,000 UNITS TOTAL) BY MOUTH EVERY 7 (SEVEN) DAYS. (Patient taking differently: Take 50,000 Units by mouth  every Saturday.)   Zinc 50 MG TABS Take 50 mg by mouth daily.   Facility-Administered Encounter Medications as of 08/02/2022  Medication   mupirocin ointment (BACTROBAN) 2 %  Fill History:  Dispensed Days Supply Quantity Provider Pharmacy  CLONAZEPAM  1 MG TABS 05/01/2022 23 70 tablet      Dispensed Days Supply Quantity Provider Pharmacy  CLONIDINE HCL 0.1 MG TABLET 04/30/2022 90 90 each      Dispensed Days Supply Quantity Provider Pharmacy  HYDROCODONE BITARTRATE/ACETAMINOPHE N 5-325 MG TABS 05/20/2022 15 30 tablet      Dispensed Days Supply Quantity Provider Pharmacy  IBUPROFEN  800 MG TABS 06/20/2022 20 60 tablet      Dispensed Days Supply Quantity Provider Pharmacy  LEVETIRACETAM 250 MG TABLET 06/12/2022 90 360 each      Dispensed Days Supply Quantity Provider Pharmacy  methocarbamol (ROBAXIN) 500 MG tablet 05/19/2022 10 40 tablet      Dispensed Days Supply Quantity Provider Pharmacy  METOPROLOL TARTRATE  25 MG TABS 06/19/2021 90 180 tablet      Dispensed Days Supply Quantity Provider Pharmacy  KLOR-CON M20  20 MEQ TBCR 06/15/2022 90 180 tablet      Dispensed Days Supply Quantity Provider Pharmacy  TIZANIDINE HCL  2 MG TABS 05/20/2022 7 30 tablet     Recent vitals BP Readings from Last 3 Encounters:  05/19/22 131/83  05/17/22 110/70  05/08/22 (!) 149/86   Pulse Readings from Last 3 Encounters:  05/19/22 73  05/17/22 69  05/08/22 73   Wt Readings from Last 3 Encounters:  05/18/22 154 lb (69.9 kg)  05/17/22 154 lb (69.9 kg)  05/08/22 152 lb (68.9 kg)   BMI Readings from Last 3 Encounters:  05/18/22 27.28 kg/m  05/17/22 27.28 kg/m  05/08/22 26.93 kg/m    Recent lab results    Component Value Date/Time   NA 140 05/19/2022 0334   NA 140 05/27/2015 0924   K 3.9 05/19/2022 0334   K 4.1 05/27/2015 0924   CL 109 05/19/2022 0334   CO2 24 05/19/2022 0334   CO2 27 05/27/2015 0924   GLUCOSE 112 (H) 05/19/2022 0334   GLUCOSE 89 05/27/2015 0924   GLUCOSE 84  01/03/2006 1118   BUN 13 05/19/2022 0334   BUN 18.8 05/27/2015 0924   CREATININE 0.58 05/19/2022 0334   CREATININE 0.9 05/27/2015 0924   CALCIUM 8.6 (L) 05/19/2022 1610  CALCIUM 9.8 05/27/2015 0924    Lab Results  Component Value Date   CREATININE 0.58 05/19/2022   GFR 85.78 05/24/2011   EGFR 74 (L) 05/27/2015   GFRNONAA >60 05/19/2022   GFRAA >60 05/19/2019   Lab Results  Component Value Date/Time   HGBA1C 5.7 (H) 05/08/2022 01:16 PM   HGBA1C 5.0 05/04/2021 01:07 PM    Lab Results  Component Value Date   CHOL 220 (H) 07/07/2021   HDL 55 07/07/2021   LDLCALC 133 (H) 07/07/2021   LDLDIRECT 129 (H) 07/07/2021   TRIG 179 (H) 07/07/2021   CHOLHDL 4.0 07/07/2021    Inetta Fermo CMA  Clinical Pharmacist Assistant [phone]

## 2022-08-03 ENCOUNTER — Other Ambulatory Visit: Payer: Self-pay | Admitting: Internal Medicine

## 2022-08-06 ENCOUNTER — Ambulatory Visit: Payer: Medicare Other | Admitting: Physical Therapy

## 2022-08-06 ENCOUNTER — Other Ambulatory Visit: Payer: Self-pay | Admitting: Internal Medicine

## 2022-08-09 ENCOUNTER — Encounter: Payer: Medicare Other | Admitting: Physical Therapy

## 2022-08-13 ENCOUNTER — Encounter: Payer: Self-pay | Admitting: Physical Therapy

## 2022-08-13 ENCOUNTER — Ambulatory Visit: Payer: Medicare Other | Attending: Orthopaedic Surgery | Admitting: Physical Therapy

## 2022-08-13 DIAGNOSIS — M25661 Stiffness of right knee, not elsewhere classified: Secondary | ICD-10-CM | POA: Insufficient documentation

## 2022-08-13 DIAGNOSIS — R2681 Unsteadiness on feet: Secondary | ICD-10-CM | POA: Insufficient documentation

## 2022-08-13 DIAGNOSIS — M25561 Pain in right knee: Secondary | ICD-10-CM | POA: Diagnosis not present

## 2022-08-13 DIAGNOSIS — R2689 Other abnormalities of gait and mobility: Secondary | ICD-10-CM | POA: Diagnosis not present

## 2022-08-13 DIAGNOSIS — M25651 Stiffness of right hip, not elsewhere classified: Secondary | ICD-10-CM | POA: Diagnosis not present

## 2022-08-13 NOTE — Therapy (Signed)
OUTPATIENT PHYSICAL THERAPY TREATMENT AND RECERTIFICATION   Patient Name: [name] MRN: [id number] DOB:05-Mar-1955, 67 y.o., female Today's Date: 08/13/2022   END OF SESSION:  PT End of Session - 08/13/22 1314     Visit Number 7    Date for PT Re-Evaluation 08/20/22    Authorization Type [employer] Medicare    PT Start Time 1314    PT Stop Time 1356    PT Time Calculation (min) 42 min    Activity Tolerance Patient tolerated treatment well    Behavior During Therapy WFL for tasks assessed/performed                 Past Medical History:  Diagnosis Date   Anemia    Angio-edema    Anxiety    Arthritis    Asthma    Atrial fibrillation (HCC)    Atrial tachycardia    Autonomic dysfunction    Complication of anesthesia    pt states wakes up with "shakes"   CVA (cerebral infarction)    2012 with dizziness and vision change felt embolic from atrial tachy   Disorder of bone and cartilage, unspecified    Diverticulosis    DM (diabetes mellitus) (HCC)    DVT (deep venous thrombosis) (HCC)    Eczema    Family history of anesthesia complication    PONV and " shaking "   Fatty liver    GERD (gastroesophageal reflux disease)    History of hiatal hernia    History of radiation therapy 10/24/2012   brain   HLD (hyperlipidemia)    HTN (hypertension)    Hypoglycemia, unspecified    Liver metastasis    Lung metastasis    Metastasis to brain (HCC)    Osteopenia    Other nonspecific abnormal serum enzyme levels    Other specified congenital anomalies of nervous system    Pacemaker    autonomic dysfunction   Pancreatitis    from therapy   Peripheral vascular disease (HCC)    POTS (postural orthostatic tachycardia syndrome)    Raynaud's disease    due to chemo   Skin cancer    Hx: of lung lesion   Past Surgical History:  Procedure Laterality Date   ABLATION SAPHENOUS VEIN W/ RFA     2002 x3   CARPAL TUNNEL RELEASE Left 2000   CHOLECYSTECTOMY N/A 03/28/2018    Procedure: LAPAROSCOPIC CHOLECYSTECTOMY WITH INTRAOPERATIVE CHOLANGIOGRAM;  Surgeon: Luretha Murphy, MD;  Location: [facility] OR;  Service: General;  Laterality: N/A;   CRANIOTOMY N/A 09/30/2012   suboccipital craniectomy   CRANIOTOMY Left 02/09/2013   Procedure: LEFT PARIETAL CRANIOTOMY with stealth;  Surgeon: Barnett Abu, MD;  Location: [facility];  Service: Neurosurgery;  Laterality: Left;  LEFT Parietal Craniotomy for tumor with stealth   DEEP AXILLARY SENTINEL NODE BIOPSY / EXCISION     due to extensive Melanoma-right arm   fatty tumor removed  2000   from chest   INSERT / REPLACE / REMOVE PACEMAKER  2012   1999, x 3   LAPAROSCOPY  09/06/2011   Procedure: LAPAROSCOPY OPERATIVE;  Surgeon: Tresa Endo A. Ernestina Penna, MD;  Location: [facility] ORS;  Service: Gynecology;  Laterality: Left;  with Left Ovarian Cystectomy    MELANOMA EXCISION     with removal of lymph nodes, left shoulder   NOSE SURGERY  2010, 2012   for nose bleeds x 2   SINOSCOPY     TONSILLECTOMY AND ADENOIDECTOMY     TOTAL HIP ARTHROPLASTY Right  05/18/2022   Procedure: RIGHT TOTAL HIP ARTHROPLASTY ANTERIOR APPROACH;  Surgeon: Kathryne Hitch, MD;  Location: [facility] ORS;  Service: Orthopedics;  Laterality: Right;   Patient Active Problem List   Diagnosis Date Noted   Status post total replacement of right hip 05/18/2022   Avascular necrosis of bone of right hip (HCC) 04/19/2022   Moderate persistent asthma 12/08/2018   Perennial allergic rhinitis with predominantly nonallergic component 12/08/2018   Allergic conjunctivitis 12/08/2018   History of epistaxis 12/08/2018   Atopic dermatitis 12/08/2018   Recurrent urticaria 12/08/2018   Skeeter syndrome 12/08/2018   Skin lesion of chest wall 07/22/2018   S/P laparoscopic cholecystectomy 03/28/2018   Dehydration 05/18/2016   Type 2 diabetes mellitus without complication, without long-term current use of insulin (HCC) 11/01/2015   History of pacemaker    POTS (postural orthostatic  tachycardia syndrome)    Iron deficiency anemia 11/11/2014   Chronic colitis 08/28/2013   Malignant melanoma, metastatic (HCC) 02/09/2013   Malignant melanoma (HCC) 12/01/2012   Brain metastasis 10/16/2012   Hypertension 09/30/2012   Anxiety state, unspecified 09/30/2012   Hypoxemia 09/30/2012   Disorder of brain 09/08/2012   Dysautonomia (HCC) 02/25/2011   Anticoagulant long-term use 01/19/2011   Neuritis 01/19/2011   Skin change 01/19/2011   CVA (cerebral infarction)    Palpitation 06/28/2010   Pacemaker-Biotronik 06/28/2010   Dizziness and giddiness 04/27/2010   DIVERTICULOSIS, COLON, HX OF 01/19/2009   CAROTID SINUS SYNDROME 11/17/2008   OTHER MALAISE AND FATIGUE 11/17/2008   HYPERTENSION, BENIGN 11/02/2008   ATRIAL TACHYCARDIA 09/16/2008   SYNCOPE, HX OF 08/09/2008   INJURY UNSPEC NERVE SHOULDER GIRDLE&UPPER LIMB 07/12/2008   PERSONAL HISTORY OF MALIGNANT MELANOMA OF SKIN 07/12/2008   LUQ PAIN 12/08/2007   NECK PAIN 09/26/2007   NUMBNESS, ARM 09/26/2007   CPK, ABNORMAL 09/26/2007   ARM PAIN, LEFT 06/25/2007   SWELLING OF LIMB 06/25/2007   OSTEOPENIA 06/25/2007    PCP: Madelin Headings, MD   REFERRING PROVIDER: Kathryne Hitch, MD  REFERRING DIAG:  [phone] (ICD-10-CM) - Status post total replacement of right hip  M87.051 (ICD-10-CM) - Avascular necrosis of bone of right hip   THERAPY DIAG:  Other abnormalities of gait and mobility  Stiffness of right hip, not elsewhere classified  Stiffness of right knee, not elsewhere classified  Acute pain of right knee  Unsteadiness on feet  RATIONALE FOR EVALUATION AND TREATMENT: Rehabilitation  ONSET DATE: 05/18/2022 - R THA, anterior approach    NEXT MD VISIT: 10/08/2022   SUBJECTIVE:  SUBJECTIVE STATEMENT: Pt  reports she has been unable to attend PT in several weeks due to an infection.   PAIN: Are you having pain? No  PERTINENT HISTORY:  PMH includes but is not limited to: anxiety, asthma, DM-II, HTN, CVA, DVT, POTS, pacemaker, malignant melanoma with brain, liver and lung metastasis s/p radiation and L parietal craniotomy, osteopenia, diverticulitis, GERD, and Raynaud's disease.   PRECAUTIONS: ICD/Pacemaker and Other: POTS  WEIGHT BEARING RESTRICTIONS: No  FALLS:  Has patient fallen in last 6 months? No  LIVING ENVIRONMENT: Lives with: lives with their spouse Lives in: House/apartment Stairs: Yes: Internal: 13 steps; on right going up, on left going up, and can reach both Has following equipment at home: Single point cane, Walker - 2 wheeled, and shower chair  OCCUPATION: Medically disabled from her cancer  PLOF: Independent and Leisure: walking 3-5 miles, 3-4x/wk (power walking with weights), conditioning with weights  PATIENT GOALS: "Walk out of here normally with no pain and back to normal activity."   OBJECTIVE: (objective measures completed at initial evaluation unless otherwise dated)  DIAGNOSTIC FINDINGS:  05/18/22 - R hip and pelvis x-rays: Status post right hip arthroplasty without evidence of complication.  PATIENT SURVEYS:  LEFS 22 / 80 = 27.5 %  COGNITION: Overall cognitive status: Within functional limits for tasks assessed    SENSATION: Neuropathy in B feet, Raynaud's syndrome  EDEMA:  Mild edema R knee and distal thigh  MUSCLE LENGTH: Hamstrings: mild tight R>L ITB: mod tight R Piriformis: mod tight R Hip flexors: mod tight R Quads: mod tight R Heelcord: NT  POSTURE:  weight shift left and leg length discrepancy with R LE longer than L  PALPATION: TTP over distal lateral R quads  LOWER EXTREMITY ROM: R hip limited in all directions Active ROM Right eval Left eval  Hip flexion    Hip extension    Hip abduction    Hip adduction    Hip  internal rotation    Hip external rotation    Knee flexion    Knee extension -1 0  Ankle dorsiflexion    Ankle plantarflexion    Ankle inversion    Ankle eversion    (Blank rows = not tested)  LOWER EXTREMITY MMT:  MMT Right eval Left eval Right 07/09/22 Right 08/13/22 Left 08/13/22  Hip flexion 3+ 4- 4 4 4   Hip extension 4- 4- 4 4 4   Hip abduction 4- 4 4 4 4   Hip adduction 4 4 4+ 4+ 4+  Hip internal rotation 4+ 4+  4+ 5  Hip external rotation 2+ 4  3- 4-  Knee flexion 5 5  5 5   Knee extension 4+ 5 4 5 5   Ankle dorsiflexion 5 5     Ankle plantarflexion 5 5     Ankle inversion       Ankle eversion        (Blank rows = not tested)  LOWER EXTREMITY SPECIAL TESTS:  Hip special tests: Ober's test: positive   FUNCTIONAL TESTS:  5 times sit to stand: 20.79 sec (knees together) Timed up and go (TUG): 11.66 sec 10 meter walk test: 10.31 sec w/o AD, 10.93 sec with SPC Functional gait assessment: 16/30, < 19 = high risk fall   GAIT: Distance walked: 50ft w/ SPC and w/o AD Assistive device utilized: Single point cane and None Level of assistance: Modified independence and SBA Gait pattern: step through pattern, decreased stride length, decreased hip/knee flexion- Right, antalgic, and trendelenburg Comments: limp  more pronounced w/o AD  STAIRS: Level of Assistance: SBA Stair Negotiation Technique: Step to Pattern with Single Rail on Right Number of Stairs: 14 x 2  Height of Stairs: 7  Comments: Patient defaulting to step to pattern as training by home health PT, but able to perform 1 flight with reciprocal pattern and SBA of PT   TODAY'S TREATMENT:   08/13/22 THERAPEUTIC EXERCISE: to improve flexibility, strength and mobility.  Demonstration, verbal and tactile cues throughout for technique.  NuStep - L5 x 6 min Seated RTB R hip ABD/ER unilateral clam x15 S/L R RTB clam x 15 Standing RTB 4-way SLR x 10 each direction bil, intermittent UE support on back of chair for  balance HEP review & update  THERAPEUTIC ACTIVITIES: LE MMT   07/09/22 Nustep L5x62min Supine isometric hip 10x5" FGA: Functional Gait Assessment: 25 / 30 = 83.3 % Strength test R LE Assessed goals for recert   07/04/22 Nustep L6x54min 5xSTS- 13 seconds Tandem stance on airex x 30 sec B Toe taps from airex to floor x 10 B Tandem walk 3x10 ft Retro gait 3x10 ft Fwd step and reach 5x B Lateral step and reach 5x B  IASTM with stick to B quads    PATIENT EDUCATION:  Education details: progress with PT, ongoing PT POC, HEP review, and HEP update - proximal LE strengthening progression Person educated: Patient Education method: Explanation, Demonstration, Verbal cues, and Handouts Education comprehension: verbalized understanding, returned demonstration, verbal cues required, and needs further education  HOME EXERCISE PROGRAM: Access Code: [email or web address] URL: [email or web address] Date: 08/13/2022 Prepared by: Glenetta Hew  Exercises - Hooklying Hamstring Stretch with Strap  - 2 x daily - 7 x weekly - 3 reps - 30 sec hold - Supine Iliotibial Band Stretch with Strap  - 2 x daily - 7 x weekly - 3 reps - 30 sec hold - Standing Hamstring Curl with Resistance  - 1 x daily - 7 x weekly - 3 sets - 10 reps - Seated Heel Slide  - 1 x daily - 7 x weekly - 3 sets - 10 reps - Supine Lower Trunk Rotation  - 1 x daily - 7 x weekly - 3 sets - 10 reps - 5 hold - Supine Single Knee to Chest Stretch  - 1 x daily - 7 x weekly - 3 sets - 10 reps - 5 hold - Clamshell with Resistance  - 1 x daily - 3-4 x weekly - 2 sets - 10 reps - 3 sec hold - Seated Isometric Hip Abduction with Resistance  - 1 x daily - 3-4 x weekly - 2 sets - 10 reps - 3 sec hold - Standing Hip Flexion with Anchored Resistance and Chair Support  - 1 x daily - 3-4 x weekly - 2 sets - 10 reps - 3 sec hold - Standing Hip Adduction with Anchored Resistance  - 1 x daily - 3-4 x weekly - 2 sets - 10 reps - 3 sec hold -  Standing Hip Extension with Anchored Resistance  - 1 x daily - 3-4 x weekly - 2 sets - 10 reps - 3 sec hold - Standing Hip Abduction with Anchored Resistance  - 1 x daily - 3-4 x weekly - 2 sets - 10 reps - 3 sec hold - Squat with Chair Touch and Resistance Loop  - 1 x daily - 3-4 x weekly - 2 sets - 10 reps - 3 sec hold   ASSESSMENT:  CLINICAL IMPRESSION: [name]  returns to PT after >30 day absence due to an infection. MMT revealing ongoing proximal LE weakness, R>L. HEP reviewed and updated to better address ongoing weakness/strength deficits, with updated HEP instructions and resistance bands provided. As she missed several visits in the current certification period due to the infection and she now has been told she will need surgery for AVN in her L hip, she will likely need recert at end of current certification period next week.   OBJECTIVE IMPAIRMENTS: Abnormal gait, decreased activity tolerance, decreased balance, decreased coordination, decreased endurance, decreased knowledge of condition, decreased knowledge of use of DME, decreased mobility, difficulty walking, decreased ROM, decreased strength, decreased safety awareness, increased fascial restrictions, impaired perceived functional ability, increased muscle spasms, impaired flexibility, improper body mechanics, postural dysfunction, and pain.   ACTIVITY LIMITATIONS: carrying, lifting, bending, sitting, standing, squatting, sleeping, stairs, transfers, bed mobility, bathing, and locomotion level  PARTICIPATION LIMITATIONS: meal prep, cleaning, laundry, driving, shopping, and community activity  PERSONAL FACTORS: Past/current experiences, Time since onset of injury/illness/exacerbation, and 3+ comorbidities: anxiety, asthma, DM-II, HTN, CVA, DVT, POTS, pacemaker, malignant melanoma with brain, liver and lung metastasis s/p radiation and L parietal craniotomy, osteopenia, diverticulitis, GERD, and Raynaud's disease  are also affecting  patient's functional outcome.   REHAB POTENTIAL: Good  CLINICAL DECISION MAKING: Evolving/moderate complexity  EVALUATION COMPLEXITY: Moderate   GOALS: Goals reviewed with patient? Yes  SHORT TERM GOALS: Target date: 06/25/2022   Patient will be independent with initial HEP. Baseline: Patient currently performing HH PT HEP Goal status: MET  07/04/22  2.  Patient will improve 5x STS time to </= 15 seconds for improved efficiency and safety with transfers Baseline: 20.79 sec Goal status: MET  07/04/22  LONG TERM GOALS: Target date: 07/16/2022, extending to 08/20/2022   Patient will be independent with advanced/ongoing HEP to improve outcomes and carryover.  Baseline:  Goal status: IN PROGRESS  08/13/22 - reviewed and updated today  2.  Patient will report at least 50-75% improvement in R hip and knee pain to improve QOL. Baseline:  Goal status: MET  07/09/22  3.  Patient will demonstrate improved B proximal LE strength to >/= 4+/5 for improved stability and ease of mobility. Baseline: Refer to above MMT table Goal status: IN PROGRESS  08/13/22 - ongoing proximal weakness R>L  4.  Patient will be able to ambulate 600' with or w/o SPC and normal gait pattern without increased pain to access community.  Baseline:  Goal status: MET  07/09/22   5.  Patient will report >/= 31/80 on LEFS to demonstrate improved functional ability. Baseline: 22 / 80 = 27.5 % Goal status: IN PROGRESS  6.  Patient will demonstrate at least 20/24 on FGA to decrease risk of falls. Baseline: 16/30 Goal status: MET  5/02/28/22 - 25/30 FGA    PLAN:  PT FREQUENCY: 1x/week (Total of 5 visits max for this per Dr. Magnus Ivan originally)   PT DURATION: 6 weeks  PLANNED INTERVENTIONS: Therapeutic exercises, Therapeutic activity, Neuromuscular re-education, Balance training, Gait training, Patient/Family education, Self Care, Joint mobilization, Stair training, DME instructions, Dry Needling, Electrical stimulation,  Cryotherapy, Moist heat, Taping, Vasopneumatic device, Ultrasound, Ionotophoresis 4mg /ml Dexamethasone, Manual therapy, and Re-evaluation  PLAN FOR NEXT SESSION: progress dynamic balance activities; narrow BOS exercises; progress hip strength   Marry Guan, PT 08/13/2022, 2:04 PM

## 2022-08-16 ENCOUNTER — Other Ambulatory Visit: Payer: Self-pay | Admitting: Orthopaedic Surgery

## 2022-08-16 ENCOUNTER — Ambulatory Visit: Payer: Medicare Other

## 2022-08-16 ENCOUNTER — Telehealth: Payer: Self-pay | Admitting: *Deleted

## 2022-08-16 DIAGNOSIS — M25651 Stiffness of right hip, not elsewhere classified: Secondary | ICD-10-CM

## 2022-08-16 DIAGNOSIS — M25561 Pain in right knee: Secondary | ICD-10-CM

## 2022-08-16 DIAGNOSIS — R2681 Unsteadiness on feet: Secondary | ICD-10-CM | POA: Diagnosis not present

## 2022-08-16 DIAGNOSIS — R2689 Other abnormalities of gait and mobility: Secondary | ICD-10-CM

## 2022-08-16 DIAGNOSIS — M25661 Stiffness of right knee, not elsewhere classified: Secondary | ICD-10-CM

## 2022-08-16 MED ORDER — IBUPROFEN 800 MG PO TABS: 800.0000 mg | ORAL_TABLET | Freq: Three times a day (TID) | ORAL | 3 refills | Status: AC | PRN

## 2022-08-16 NOTE — Telephone Encounter (Signed)
Patient called requesting refill of Rx Ibuprofen.Thank you.

## 2022-08-16 NOTE — Therapy (Signed)
OUTPATIENT PHYSICAL THERAPY TREATMENT    Patient Name: [name] MRN: [id number] DOB:Jun 27, 1955, 67 y.o., female Today's Date: 08/16/2022   END OF SESSION:  PT End of Session - 08/16/22 1452     Visit Number 8    Date for PT Re-Evaluation 08/20/22    Authorization Type [employer] Medicare    PT Start Time 1448    PT Stop Time 1532    PT Time Calculation (min) 44 min    Activity Tolerance Patient tolerated treatment well    Behavior During Therapy WFL for tasks assessed/performed                 Past Medical History:  Diagnosis Date   Anemia    Angio-edema    Anxiety    Arthritis    Asthma    Atrial fibrillation (HCC)    Atrial tachycardia    Autonomic dysfunction    Complication of anesthesia    pt states wakes up with "shakes"   CVA (cerebral infarction)    2012 with dizziness and vision change felt embolic from atrial tachy   Disorder of bone and cartilage, unspecified    Diverticulosis    DM (diabetes mellitus) (HCC)    DVT (deep venous thrombosis) (HCC)    Eczema    Family history of anesthesia complication    PONV and " shaking "   Fatty liver    GERD (gastroesophageal reflux disease)    History of hiatal hernia    History of radiation therapy 10/24/2012   brain   HLD (hyperlipidemia)    HTN (hypertension)    Hypoglycemia, unspecified    Liver metastasis    Lung metastasis    Metastasis to brain (HCC)    Osteopenia    Other nonspecific abnormal serum enzyme levels    Other specified congenital anomalies of nervous system    Pacemaker    autonomic dysfunction   Pancreatitis    from therapy   Peripheral vascular disease (HCC)    POTS (postural orthostatic tachycardia syndrome)    Raynaud's disease    due to chemo   Skin cancer    Hx: of lung lesion   Past Surgical History:  Procedure Laterality Date   ABLATION SAPHENOUS VEIN W/ RFA     2002 x3   CARPAL TUNNEL RELEASE Left 2000   CHOLECYSTECTOMY N/A 03/28/2018   Procedure:  LAPAROSCOPIC CHOLECYSTECTOMY WITH INTRAOPERATIVE CHOLANGIOGRAM;  Surgeon: Luretha Murphy, MD;  Location: [facility];  Service: General;  Laterality: N/A;   CRANIOTOMY N/A 09/30/2012   suboccipital craniectomy   CRANIOTOMY Left 02/09/2013   Procedure: LEFT PARIETAL CRANIOTOMY with stealth;  Surgeon: Barnett Abu, MD;  Location: [facility];  Service: Neurosurgery;  Laterality: Left;  LEFT Parietal Craniotomy for tumor with stealth   DEEP AXILLARY SENTINEL NODE BIOPSY / EXCISION     due to extensive Melanoma-right arm   fatty tumor removed  2000   from chest   INSERT / REPLACE / REMOVE PACEMAKER  2012   1999, x 3   LAPAROSCOPY  09/06/2011   Procedure: LAPAROSCOPY OPERATIVE;  Surgeon: Tresa Endo A. Ernestina Penna, MD;  Location: [facility] ORS;  Service: Gynecology;  Laterality: Left;  with Left Ovarian Cystectomy    MELANOMA EXCISION     with removal of lymph nodes, left shoulder   NOSE SURGERY  2010, 2012   for nose bleeds x 2   SINOSCOPY     TONSILLECTOMY AND ADENOIDECTOMY     TOTAL HIP ARTHROPLASTY Right 05/18/2022  Procedure: RIGHT TOTAL HIP ARTHROPLASTY ANTERIOR APPROACH;  Surgeon: Kathryne Hitch, MD;  Location: [facility] ORS;  Service: Orthopedics;  Laterality: Right;   Patient Active Problem List   Diagnosis Date Noted   Status post total replacement of right hip 05/18/2022   Avascular necrosis of bone of right hip (HCC) 04/19/2022   Moderate persistent asthma 12/08/2018   Perennial allergic rhinitis with predominantly nonallergic component 12/08/2018   Allergic conjunctivitis 12/08/2018   History of epistaxis 12/08/2018   Atopic dermatitis 12/08/2018   Recurrent urticaria 12/08/2018   Skeeter syndrome 12/08/2018   Skin lesion of chest wall 07/22/2018   S/P laparoscopic cholecystectomy 03/28/2018   Dehydration 05/18/2016   Type 2 diabetes mellitus without complication, without long-term current use of insulin (HCC) 11/01/2015   History of pacemaker    POTS (postural orthostatic tachycardia  syndrome)    Iron deficiency anemia 11/11/2014   Chronic colitis 08/28/2013   Malignant melanoma, metastatic (HCC) 02/09/2013   Malignant melanoma (HCC) 12/01/2012   Brain metastasis 10/16/2012   Hypertension 09/30/2012   Anxiety state, unspecified 09/30/2012   Hypoxemia 09/30/2012   Disorder of brain 09/08/2012   Dysautonomia (HCC) 02/25/2011   Anticoagulant long-term use 01/19/2011   Neuritis 01/19/2011   Skin change 01/19/2011   CVA (cerebral infarction)    Palpitation 06/28/2010   Pacemaker-Biotronik 06/28/2010   Dizziness and giddiness 04/27/2010   DIVERTICULOSIS, COLON, HX OF 01/19/2009   CAROTID SINUS SYNDROME 11/17/2008   OTHER MALAISE AND FATIGUE 11/17/2008   HYPERTENSION, BENIGN 11/02/2008   ATRIAL TACHYCARDIA 09/16/2008   SYNCOPE, HX OF 08/09/2008   INJURY UNSPEC NERVE SHOULDER GIRDLE&UPPER LIMB 07/12/2008   PERSONAL HISTORY OF MALIGNANT MELANOMA OF SKIN 07/12/2008   LUQ PAIN 12/08/2007   NECK PAIN 09/26/2007   NUMBNESS, ARM 09/26/2007   CPK, ABNORMAL 09/26/2007   ARM PAIN, LEFT 06/25/2007   SWELLING OF LIMB 06/25/2007   OSTEOPENIA 06/25/2007    PCP: Madelin Headings, MD   REFERRING PROVIDER: Kathryne Hitch, MD  REFERRING DIAG:  [phone] (ICD-10-CM) - Status post total replacement of right hip  M87.051 (ICD-10-CM) - Avascular necrosis of bone of right hip   THERAPY DIAG:  Other abnormalities of gait and mobility  Stiffness of right hip, not elsewhere classified  Stiffness of right knee, not elsewhere classified  Acute pain of right knee  Unsteadiness on feet  RATIONALE FOR EVALUATION AND TREATMENT: Rehabilitation  ONSET DATE: 05/18/2022 - R THA, anterior approach    NEXT MD VISIT: 10/08/2022   SUBJECTIVE:  SUBJECTIVE STATEMENT: Pt reports her  knee pain is improved but now having more hip pain.  PAIN: Are you having pain? No  PERTINENT HISTORY:  PMH includes but is not limited to: anxiety, asthma, DM-II, HTN, CVA, DVT, POTS, pacemaker, malignant melanoma with brain, liver and lung metastasis s/p radiation and L parietal craniotomy, osteopenia, diverticulitis, GERD, and Raynaud's disease.   PRECAUTIONS: ICD/Pacemaker and Other: POTS  WEIGHT BEARING RESTRICTIONS: No  FALLS:  Has patient fallen in last 6 months? No  LIVING ENVIRONMENT: Lives with: lives with their spouse Lives in: House/apartment Stairs: Yes: Internal: 13 steps; on right going up, on left going up, and can reach both Has following equipment at home: Single point cane, Walker - 2 wheeled, and shower chair  OCCUPATION: Medically disabled from her cancer  PLOF: Independent and Leisure: walking 3-5 miles, 3-4x/wk (power walking with weights), conditioning with weights  PATIENT GOALS: "Walk out of here normally with no pain and back to normal activity."   OBJECTIVE: (objective measures completed at initial evaluation unless otherwise dated)  DIAGNOSTIC FINDINGS:  05/18/22 - R hip and pelvis x-rays: Status post right hip arthroplasty without evidence of complication.  PATIENT SURVEYS:  LEFS 22 / 80 = 27.5 %  COGNITION: Overall cognitive status: Within functional limits for tasks assessed    SENSATION: Neuropathy in B feet, Raynaud's syndrome  EDEMA:  Mild edema R knee and distal thigh  MUSCLE LENGTH: Hamstrings: mild tight R>L ITB: mod tight R Piriformis: mod tight R Hip flexors: mod tight R Quads: mod tight R Heelcord: NT  POSTURE:  weight shift left and leg length discrepancy with R LE longer than L  PALPATION: TTP over distal lateral R quads  LOWER EXTREMITY ROM: R hip limited in all directions Active ROM Right eval Left eval  Hip flexion    Hip extension    Hip abduction    Hip adduction    Hip internal rotation    Hip  external rotation    Knee flexion    Knee extension -1 0  Ankle dorsiflexion    Ankle plantarflexion    Ankle inversion    Ankle eversion    (Blank rows = not tested)  LOWER EXTREMITY MMT:  MMT Right eval Left eval Right 07/09/22 Right 08/13/22 Left 08/13/22  Hip flexion 3+ 4- 4 4 4   Hip extension 4- 4- 4 4 4   Hip abduction 4- 4 4 4 4   Hip adduction 4 4 4+ 4+ 4+  Hip internal rotation 4+ 4+  4+ 5  Hip external rotation 2+ 4  3- 4-  Knee flexion 5 5  5 5   Knee extension 4+ 5 4 5 5   Ankle dorsiflexion 5 5     Ankle plantarflexion 5 5     Ankle inversion       Ankle eversion        (Blank rows = not tested)  LOWER EXTREMITY SPECIAL TESTS:  Hip special tests: Ober's test: positive   FUNCTIONAL TESTS:  5 times sit to stand: 20.79 sec (knees together) Timed up and go (TUG): 11.66 sec 10 meter walk test: 10.31 sec w/o AD, 10.93 sec with SPC Functional gait assessment: 16/30, < 19 = high risk fall   GAIT: Distance walked: 26ft w/ SPC and w/o AD Assistive device utilized: Single point cane and None Level of assistance: Modified independence and SBA Gait pattern: step through pattern, decreased stride length, decreased hip/knee flexion- Right, antalgic, and trendelenburg Comments: limp more pronounced w/o AD  STAIRS: Level of Assistance: SBA Stair Negotiation Technique: Step to Pattern with Single Rail on Right Number of Stairs: 14 x 2  Height of Stairs: 7  Comments: Patient defaulting to step to pattern as training by home health PT, but able to perform 1 flight with reciprocal pattern and SBA of PT   TODAY'S TREATMENT:  08/16/22 THERAPEUTIC EXERCISE: to improve flexibility, strength and mobility.  Demonstration, verbal and tactile cues throughout for technique.  Treadmill 1.7 mph 1.0 incline x Standing RTB 4-way SLR 2x10 each direction bil, UE support on back of chair for balance Leg press 25lb x 20 BLE S/L R clamshell with graded resistance x 15- one isometric  hold for 10 sec hold S/L R hip abduction 2x10  08/13/22 THERAPEUTIC EXERCISE: to improve flexibility, strength and mobility.  Demonstration, verbal and tactile cues throughout for technique.  NuStep - L5 x 6 min Seated RTB R hip ABD/ER unilateral clam x15 S/L R RTB clam x 15 Standing RTB 4-way SLR x 10 each direction bil, intermittent UE support on back of chair for balance HEP review & update  THERAPEUTIC ACTIVITIES: LE MMT   07/09/22 Nustep L5x62min Supine isometric hip 10x5" FGA: Functional Gait Assessment: 25 / 30 = 83.3 % Strength test R LE Assessed goals for recert   07/04/22 Nustep L6x32min 5xSTS- 13 seconds Tandem stance on airex x 30 sec B Toe taps from airex to floor x 10 B Tandem walk 3x10 ft Retro gait 3x10 ft Fwd step and reach 5x B Lateral step and reach 5x B  IASTM with stick to B quads    PATIENT EDUCATION:  Education details: progress with PT, ongoing PT POC, HEP review, and HEP update - proximal LE strengthening progression Person educated: Patient Education method: Explanation, Demonstration, Verbal cues, and Handouts Education comprehension: verbalized understanding, returned demonstration, verbal cues required, and needs further education  HOME EXERCISE PROGRAM: Access Code: [email or web address] URL: [email or web address] Date: 08/13/2022 Prepared by: Glenetta Hew  Exercises - Hooklying Hamstring Stretch with Strap  - 2 x daily - 7 x weekly - 3 reps - 30 sec hold - Supine Iliotibial Band Stretch with Strap  - 2 x daily - 7 x weekly - 3 reps - 30 sec hold - Standing Hamstring Curl with Resistance  - 1 x daily - 7 x weekly - 3 sets - 10 reps - Seated Heel Slide  - 1 x daily - 7 x weekly - 3 sets - 10 reps - Supine Lower Trunk Rotation  - 1 x daily - 7 x weekly - 3 sets - 10 reps - 5 hold - Supine Single Knee to Chest Stretch  - 1 x daily - 7 x weekly - 3 sets - 10 reps - 5 hold - Clamshell with Resistance  - 1 x daily - 3-4 x weekly - 2 sets - 10  reps - 3 sec hold - Seated Isometric Hip Abduction with Resistance  - 1 x daily - 3-4 x weekly - 2 sets - 10 reps - 3 sec hold - Standing Hip Flexion with Anchored Resistance and Chair Support  - 1 x daily - 3-4 x weekly - 2 sets - 10 reps - 3 sec hold - Standing Hip Adduction with Anchored Resistance  - 1 x daily - 3-4 x weekly - 2 sets - 10 reps - 3 sec hold - Standing Hip Extension with Anchored Resistance  - 1 x daily - 3-4 x weekly - 2 sets - 10 reps -  3 sec hold - Standing Hip Abduction with Anchored Resistance  - 1 x daily - 3-4 x weekly - 2 sets - 10 reps - 3 sec hold - Squat with Chair Touch and Resistance Loop  - 1 x daily - 3-4 x weekly - 2 sets - 10 reps - 3 sec hold   ASSESSMENT:  CLINICAL IMPRESSION: Continued with hip strengthening to tolerance. She may be able to progress 4 way hip to green TB next visit based on how she did today. She was fatigued towards the end of exercises. Inquired about MLD for swelling in R LE she was given options of different clinics around this area that provide this. Will plan for recert next visit.  OBJECTIVE IMPAIRMENTS: Abnormal gait, decreased activity tolerance, decreased balance, decreased coordination, decreased endurance, decreased knowledge of condition, decreased knowledge of use of DME, decreased mobility, difficulty walking, decreased ROM, decreased strength, decreased safety awareness, increased fascial restrictions, impaired perceived functional ability, increased muscle spasms, impaired flexibility, improper body mechanics, postural dysfunction, and pain.   ACTIVITY LIMITATIONS: carrying, lifting, bending, sitting, standing, squatting, sleeping, stairs, transfers, bed mobility, bathing, and locomotion level  PARTICIPATION LIMITATIONS: meal prep, cleaning, laundry, driving, shopping, and community activity  PERSONAL FACTORS: Past/current experiences, Time since onset of injury/illness/exacerbation, and 3+ comorbidities: anxiety, asthma,  DM-II, HTN, CVA, DVT, POTS, pacemaker, malignant melanoma with brain, liver and lung metastasis s/p radiation and L parietal craniotomy, osteopenia, diverticulitis, GERD, and Raynaud's disease  are also affecting patient's functional outcome.   REHAB POTENTIAL: Good  CLINICAL DECISION MAKING: Evolving/moderate complexity  EVALUATION COMPLEXITY: Moderate   GOALS: Goals reviewed with patient? Yes  SHORT TERM GOALS: Target date: 06/25/2022   Patient will be independent with initial HEP. Baseline: Patient currently performing HH PT HEP Goal status: MET  07/04/22  2.  Patient will improve 5x STS time to </= 15 seconds for improved efficiency and safety with transfers Baseline: 20.79 sec Goal status: MET  07/04/22  LONG TERM GOALS: Target date: 07/16/2022, extending to 08/20/2022   Patient will be independent with advanced/ongoing HEP to improve outcomes and carryover.  Baseline:  Goal status: IN PROGRESS  08/13/22 - reviewed and updated today  2.  Patient will report at least 50-75% improvement in R hip and knee pain to improve QOL. Baseline:  Goal status: MET  07/09/22  3.  Patient will demonstrate improved B proximal LE strength to >/= 4+/5 for improved stability and ease of mobility. Baseline: Refer to above MMT table Goal status: IN PROGRESS  08/13/22 - ongoing proximal weakness R>L  4.  Patient will be able to ambulate 600' with or w/o SPC and normal gait pattern without increased pain to access community.  Baseline:  Goal status: MET  07/09/22   5.  Patient will report >/= 31/80 on LEFS to demonstrate improved functional ability. Baseline: 22 / 80 = 27.5 % Goal status: IN PROGRESS  6.  Patient will demonstrate at least 20/24 on FGA to decrease risk of falls. Baseline: 16/30 Goal status: MET  5/02/28/22 - 25/30 FGA    PLAN:  PT FREQUENCY: 1x/week (Total of 5 visits max for this per Dr. Magnus Ivan originally)   PT DURATION: 6 weeks  PLANNED INTERVENTIONS: Therapeutic exercises,  Therapeutic activity, Neuromuscular re-education, Balance training, Gait training, Patient/Family education, Self Care, Joint mobilization, Stair training, DME instructions, Dry Needling, Electrical stimulation, Cryotherapy, Moist heat, Taping, Vasopneumatic device, Ultrasound, Ionotophoresis 4mg /ml Dexamethasone, Manual therapy, and Re-evaluation  PLAN FOR NEXT SESSION: recert; add green TB at  4 way hip; progress dynamic balance activities; narrow BOS exercises; progress hip strength   Darleene Cleaver, PTA 08/16/2022, 4:03 PM

## 2022-08-17 ENCOUNTER — Other Ambulatory Visit: Payer: Self-pay | Admitting: *Deleted

## 2022-08-17 ENCOUNTER — Telehealth: Payer: Self-pay | Admitting: *Deleted

## 2022-08-17 DIAGNOSIS — M7989 Other specified soft tissue disorders: Secondary | ICD-10-CM

## 2022-08-17 DIAGNOSIS — Z96641 Presence of right artificial hip joint: Secondary | ICD-10-CM

## 2022-08-17 DIAGNOSIS — C439 Malignant melanoma of skin, unspecified: Secondary | ICD-10-CM

## 2022-08-17 NOTE — Telephone Encounter (Signed)
Referral made to OPPT for Lymphedema management.

## 2022-08-17 NOTE — Telephone Encounter (Signed)
Spoke to patient today and she is still attending some OPPT at [facility]. The therapist noted that she is still very swollen and it appears to be some lymphedema in the hip area. With her cancer diagnosis and the removal of so many lymph nodes during treatment, this may be why she remains swollen. She asked about attending therapy specifically for this, which would be the [facility]. They would need a referral to see her there. How do you feel about switching her therapy to that location with attempt to have her seen by lymphedema clinic to see if there's anything that can be done for this?

## 2022-08-20 ENCOUNTER — Ambulatory Visit: Payer: Medicare Other | Admitting: Physical Therapy

## 2022-08-20 ENCOUNTER — Encounter: Payer: Self-pay | Admitting: Physical Therapy

## 2022-08-20 DIAGNOSIS — R2681 Unsteadiness on feet: Secondary | ICD-10-CM

## 2022-08-20 DIAGNOSIS — M25561 Pain in right knee: Secondary | ICD-10-CM | POA: Diagnosis not present

## 2022-08-20 DIAGNOSIS — R2689 Other abnormalities of gait and mobility: Secondary | ICD-10-CM | POA: Diagnosis not present

## 2022-08-20 DIAGNOSIS — M25661 Stiffness of right knee, not elsewhere classified: Secondary | ICD-10-CM | POA: Diagnosis not present

## 2022-08-20 DIAGNOSIS — M25651 Stiffness of right hip, not elsewhere classified: Secondary | ICD-10-CM | POA: Diagnosis not present

## 2022-08-20 NOTE — Therapy (Addendum)
OUTPATIENT PHYSICAL THERAPY TREATMENT / RE-CERTIFICATION / DISCHARGE SUMMARY  Progress Note  Reporting Period 07/09/2022 to 08/20/2022   See note below for Objective Data and Assessment of Progress/Goals.     Patient Name: [name] MRN: [id number] DOB:[birth year], 67 y.o., female Today's Date: 08/20/2022   END OF SESSION:  PT End of Session - 08/20/22 1316     Visit Number 9    Date for PT Re-Evaluation 10/01/22    Authorization Type [employer] Medicare    PT Start Time 1316    PT Stop Time 1405    PT Time Calculation (min) 49 min    Activity Tolerance Patient tolerated treatment well    Behavior During Therapy WFL for tasks assessed/performed                 Past Medical History:  Diagnosis Date   Anemia    Angio-edema    Anxiety    Arthritis    Asthma    Atrial fibrillation (HCC)    Atrial tachycardia    Autonomic dysfunction    Complication of anesthesia    pt states wakes up with "shakes"   CVA (cerebral infarction)    2012 with dizziness and vision change felt embolic from atrial tachy   Disorder of bone and cartilage, unspecified    Diverticulosis    DM (diabetes mellitus) (HCC)    DVT (deep venous thrombosis) (HCC)    Eczema    Family history of anesthesia complication    PONV and " shaking "   Fatty liver    GERD (gastroesophageal reflux disease)    History of hiatal hernia    History of radiation therapy 10/24/2012   brain   HLD (hyperlipidemia)    HTN (hypertension)    Hypoglycemia, unspecified    Liver metastasis    Lung metastasis    Metastasis to brain (HCC)    Osteopenia    Other nonspecific abnormal serum enzyme levels    Other specified congenital anomalies of nervous system    Pacemaker    autonomic dysfunction   Pancreatitis    from therapy   Peripheral vascular disease (HCC)    POTS (postural orthostatic tachycardia syndrome)    Raynaud's disease    due to chemo   Skin cancer    Hx: of lung lesion   Past Surgical  History:  Procedure Laterality Date   ABLATION SAPHENOUS VEIN W/ RFA     2002 x3   CARPAL TUNNEL RELEASE Left 2000   CHOLECYSTECTOMY N/A 03/28/2018   Procedure: LAPAROSCOPIC CHOLECYSTECTOMY WITH INTRAOPERATIVE CHOLANGIOGRAM;  Surgeon: Luretha Murphy, MD;  Location: [facility] OR;  Service: General;  Laterality: N/A;   CRANIOTOMY N/A 09/30/2012   suboccipital craniectomy   CRANIOTOMY Left 02/09/2013   Procedure: LEFT PARIETAL CRANIOTOMY with stealth;  Surgeon: Barnett Abu, MD;  Location: [facility];  Service: Neurosurgery;  Laterality: Left;  LEFT Parietal Craniotomy for tumor with stealth   DEEP AXILLARY SENTINEL NODE BIOPSY / EXCISION     due to extensive Melanoma-right arm   fatty tumor removed  2000   from chest   INSERT / REPLACE / REMOVE PACEMAKER  2012   1999, x 3   LAPAROSCOPY  09/06/2011   Procedure: LAPAROSCOPY OPERATIVE;  Surgeon: Tresa Endo A. Ernestina Penna, MD;  Location: [facility] ORS;  Service: Gynecology;  Laterality: Left;  with Left Ovarian Cystectomy    MELANOMA EXCISION     with removal of lymph nodes, left shoulder   NOSE SURGERY  2010,  2012   for nose bleeds x 2   SINOSCOPY     TONSILLECTOMY AND ADENOIDECTOMY     TOTAL HIP ARTHROPLASTY Right 05/18/2022   Procedure: RIGHT TOTAL HIP ARTHROPLASTY ANTERIOR APPROACH;  Surgeon: Kathryne Hitch, MD;  Location: [facility] ORS;  Service: Orthopedics;  Laterality: Right;   Patient Active Problem List   Diagnosis Date Noted   Status post total replacement of right hip 05/18/2022   Avascular necrosis of bone of right hip (HCC) 04/19/2022   Moderate persistent asthma 12/08/2018   Perennial allergic rhinitis with predominantly nonallergic component 12/08/2018   Allergic conjunctivitis 12/08/2018   History of epistaxis 12/08/2018   Atopic dermatitis 12/08/2018   Recurrent urticaria 12/08/2018   Skeeter syndrome 12/08/2018   Skin lesion of chest wall 07/22/2018   S/P laparoscopic cholecystectomy 03/28/2018   Dehydration 05/18/2016   Type 2  diabetes mellitus without complication, without long-term current use of insulin (HCC) 11/01/2015   History of pacemaker    POTS (postural orthostatic tachycardia syndrome)    Iron deficiency anemia 11/11/2014   Chronic colitis 08/28/2013   Malignant melanoma, metastatic (HCC) 02/09/2013   Malignant melanoma (HCC) 12/01/2012   Brain metastasis 10/16/2012   Hypertension 09/30/2012   Anxiety state, unspecified 09/30/2012   Hypoxemia 09/30/2012   Disorder of brain 09/08/2012   Dysautonomia (HCC) 02/25/2011   Anticoagulant long-term use 01/19/2011   Neuritis 01/19/2011   Skin change 01/19/2011   CVA (cerebral infarction)    Palpitation 06/28/2010   Pacemaker-Biotronik 06/28/2010   Dizziness and giddiness 04/27/2010   DIVERTICULOSIS, COLON, HX OF 01/19/2009   CAROTID SINUS SYNDROME 11/17/2008   OTHER MALAISE AND FATIGUE 11/17/2008   HYPERTENSION, BENIGN 11/02/2008   ATRIAL TACHYCARDIA 09/16/2008   SYNCOPE, HX OF 08/09/2008   INJURY UNSPEC NERVE SHOULDER GIRDLE&UPPER LIMB 07/12/2008   PERSONAL HISTORY OF MALIGNANT MELANOMA OF SKIN 07/12/2008   LUQ PAIN 12/08/2007   NECK PAIN 09/26/2007   NUMBNESS, ARM 09/26/2007   CPK, ABNORMAL 09/26/2007   ARM PAIN, LEFT 06/25/2007   SWELLING OF LIMB 06/25/2007   OSTEOPENIA 06/25/2007    PCP: Madelin Headings, MD   REFERRING PROVIDER: Kathryne Hitch, MD  REFERRING DIAG:  [phone] (ICD-10-CM) - Status post total replacement of right hip  M87.051 (ICD-10-CM) - Avascular necrosis of bone of right hip   THERAPY DIAG:  Other abnormalities of gait and mobility  Stiffness of right hip, not elsewhere classified  Stiffness of right knee, not elsewhere classified  Acute pain of right knee  Unsteadiness on feet  RATIONALE FOR EVALUATION AND TREATMENT: Rehabilitation  ONSET DATE: 05/18/2022 - R THA, anterior approach    NEXT MD VISIT: 10/08/2022   SUBJECTIVE:  SUBJECTIVE STATEMENT: Pt denies pain other than a headache today.  She reports Dr. Eliberto Ivory assistant will be sending her to a lymphedema PT but she wants to continue with her current PT until this starts.  PAIN: Are you having pain? No  PERTINENT HISTORY:  PMH includes but is not limited to: anxiety, asthma, DM-II, HTN, CVA, DVT, POTS, pacemaker, malignant melanoma with brain, liver and lung metastasis s/p radiation and L parietal craniotomy, osteopenia, diverticulitis, GERD, and Raynaud's disease.   PRECAUTIONS: ICD/Pacemaker and Other: POTS  WEIGHT BEARING RESTRICTIONS: No  FALLS:  Has patient fallen in last 6 months? No  LIVING ENVIRONMENT: Lives with: lives with their spouse Lives in: House/apartment Stairs: Yes: Internal: 13 steps; on right going up, on left going up, and can reach both Has following equipment at home: Single point cane, Walker - 2 wheeled, and shower chair  OCCUPATION: Medically disabled from her cancer  PLOF: Independent and Leisure: walking 3-5 miles, 3-4x/wk (power walking with weights), conditioning with weights  PATIENT GOALS: "Walk out of here normally with no pain and back to normal activity."   OBJECTIVE: (objective measures completed at initial evaluation unless otherwise dated)  DIAGNOSTIC FINDINGS:  05/18/22 - R hip and pelvis x-rays: Status post right hip arthroplasty without evidence of complication.  PATIENT SURVEYS:  LEFS 22 / 80 = 27.5 % 08/20/22 - 57 / 80 = 71.3 %  COGNITION: Overall cognitive status: Within functional limits for tasks assessed    SENSATION: Neuropathy in B feet, Raynaud's syndrome  EDEMA:  Mild edema R knee and distal thigh  MUSCLE LENGTH: Hamstrings: mild tight R>L ITB: mod tight R Piriformis: mod tight R Hip flexors: mod tight R Quads: mod tight R Heelcord: NT  POSTURE:   weight shift left and leg length discrepancy with R LE longer than L  PALPATION: TTP over distal lateral R quads  LOWER EXTREMITY ROM: R hip limited in all directions Active ROM Right eval Left eval  Hip flexion    Hip extension    Hip abduction    Hip adduction    Hip internal rotation    Hip external rotation    Knee flexion    Knee extension -1 0  Ankle dorsiflexion    Ankle plantarflexion    Ankle inversion    Ankle eversion    (Blank rows = not tested)  LOWER EXTREMITY MMT:  MMT Right  eval Left    eval Right 07/09/22 Right 08/13/22 Left 08/13/22  Hip flexion 3+ 4- 4 4 4   Hip extension 4- 4- 4 4 4   Hip abduction 4- 4 4 4 4   Hip adduction 4 4 4+ 4+ 4+  Hip internal rotation 4+ 4+  4+ 5  Hip external rotation 2+ 4  3- 4-  Knee flexion 5 5  5 5   Knee extension 4+ 5 4 5 5   Ankle dorsiflexion 5 5     Ankle plantarflexion 5 5     Ankle inversion       Ankle eversion        (Blank rows = not tested)  LOWER EXTREMITY SPECIAL TESTS:  Hip special tests: Ober's test: positive   FUNCTIONAL TESTS:  5 times sit to stand: 20.79 sec (knees together) Timed up and go (TUG): 11.66 sec 10 meter walk test: 10.31 sec w/o AD, 10.93 sec with SPC Functional gait assessment: 16/30, < 19 = high risk fall    07/09/22 - FGA = 25/30  GAIT: Distance walked: 47ft w/ SPC  and w/o AD Assistive device utilized: Single point cane and None Level of assistance: Modified independence and SBA Gait pattern: step through pattern, decreased stride length, decreased hip/knee flexion- Right, antalgic, and trendelenburg Comments: limp more pronounced w/o AD  STAIRS: Level of Assistance: SBA Stair Negotiation Technique: Step to Pattern with Single Rail on Right Number of Stairs: 14 x 2  Height of Stairs: 7  Comments: Patient defaulting to step to pattern as training by home health PT, but able to perform 1 flight with reciprocal pattern and SBA of PT   TODAY'S TREATMENT:    08/20/22 THERAPEUTIC EXERCISE: to improve flexibility, strength and mobility.  Demonstration, verbal and tactile cues throughout for technique.  Rec Bike - L3 x 6 min Standing SLS + GTB 4-way opp LE SLR x 10 each direction bil, occasional 1 pole A for balance  THERAPEUTIC ACTIVITIES: LEFS: 57 / 80 = 71.3 % Goal assessment Floor to stand transfers - CGA/min assist through 1/2 kneel with hands on floor and 1 hand on elevated knee (assist from PT necessary to achieve stability upon standing) - encouraged pt to practice with UE support on chair or other solid surface Sit to stand from low surface (14" from wooden box) x 3 - cues for fwd weight shift reaching fwd with UEs to increase fwd momentum  NEUROMUSCULAR RE-EDUCATION: To improve balance, proprioception, coordination, and reduce fall risk.  Standing on BOSU initially with B UE support from PT progressing to B pole A with CGA of PT at hips: Lateral weight shift x 10 Ant/posterior weight shift x 10 Mini-squat x 10   08/16/22 THERAPEUTIC EXERCISE: to improve flexibility, strength and mobility.  Demonstration, verbal and tactile cues throughout for technique.  Treadmill 1.7 mph 1.0 incline x Standing RTB 4-way SLR 2x10 each direction bil, UE support on back of chair for balance Leg press 25lb x 20 BLE S/L R clamshell with graded resistance x 15- one isometric hold for 10 sec hold S/L R hip abduction 2x10   08/13/22 THERAPEUTIC EXERCISE: to improve flexibility, strength and mobility.  Demonstration, verbal and tactile cues throughout for technique.  NuStep - L5 x 6 min Seated RTB R hip ABD/ER unilateral clam x15 S/L R RTB clam x 15 Standing RTB 4-way SLR x 10 each direction bil, intermittent UE support on back of chair for balance HEP review & update  THERAPEUTIC ACTIVITIES: LE MMT   PATIENT EDUCATION:  Education details: progress with PT, ongoing PT POC, and HEP progression - 4-way standing SLR advanced to GTB Person  educated: Patient Education method: Explanation, Demonstration, and Verbal cues Education comprehension: verbalized understanding, returned demonstration, verbal cues required, and needs further education  HOME EXERCISE PROGRAM: Access Code: [email or web address] URL: [email or web address] Date: 08/13/2022 Prepared by: Glenetta Hew  Exercises - Hooklying Hamstring Stretch with Strap  - 2 x daily - 7 x weekly - 3 reps - 30 sec hold - Supine Iliotibial Band Stretch with Strap  - 2 x daily - 7 x weekly - 3 reps - 30 sec hold - Standing Hamstring Curl with Resistance  - 1 x daily - 7 x weekly - 3 sets - 10 reps - Seated Heel Slide  - 1 x daily - 7 x weekly - 3 sets - 10 reps - Supine Lower Trunk Rotation  - 1 x daily - 7 x weekly - 3 sets - 10 reps - 5 hold - Supine Single Knee to Chest Stretch  - 1 x daily - 7 x weekly -  3 sets - 10 reps - 5 hold - Clamshell with Resistance  - 1 x daily - 3-4 x weekly - 2 sets - 10 reps - 3 sec hold - Seated Isometric Hip Abduction with Resistance  - 1 x daily - 3-4 x weekly - 2 sets - 10 reps - 3 sec hold - Standing Hip Flexion with Anchored Resistance and Chair Support  - 1 x daily - 3-4 x weekly - 2 sets - 10 reps - 3 sec hold - Standing Hip Adduction with Anchored Resistance  - 1 x daily - 3-4 x weekly - 2 sets - 10 reps - 3 sec hold - Standing Hip Extension with Anchored Resistance  - 1 x daily - 3-4 x weekly - 2 sets - 10 reps - 3 sec hold - Standing Hip Abduction with Anchored Resistance  - 1 x daily - 3-4 x weekly - 2 sets - 10 reps - 3 sec hold - Squat with Chair Touch and Resistance Loop  - 1 x daily - 3-4 x weekly - 2 sets - 10 reps - 3 sec hold   ASSESSMENT:  CLINICAL IMPRESSION: Linett continues to demonstrate good progress with PT with LEFS revealing significant improvement in functional activity tolerance with reduced disability. Weakness still evident in proximal LE musculature leading to difficulty with transfers from low seat height or  floor to stand transitions with pt worried about ability to return to standing after a fall or if she would need to get down on the floor.  We reviewed strategies for safe transfers from low surfaces as well as transfers from floor to stand, however patient requiring PT assist to stabilize upon reaching standing position.  Given ongoing proximal LE weakness and difficulty with transitional movements as well as some continued balance concerns, will recommend recert with POC extended 1x/wk for 4-6 weeks.  Patient expecting to start PT for lower extremity lymphedema during this timeframe and will adjust POC if needed to accommodate this.  OBJECTIVE IMPAIRMENTS: Abnormal gait, decreased activity tolerance, decreased balance, decreased coordination, decreased endurance, decreased knowledge of condition, decreased knowledge of use of DME, decreased mobility, difficulty walking, decreased ROM, decreased strength, decreased safety awareness, increased fascial restrictions, impaired perceived functional ability, increased muscle spasms, impaired flexibility, improper body mechanics, postural dysfunction, and pain.   ACTIVITY LIMITATIONS: carrying, lifting, bending, sitting, standing, squatting, sleeping, stairs, transfers, bed mobility, bathing, and locomotion level  PARTICIPATION LIMITATIONS: meal prep, cleaning, laundry, driving, shopping, and community activity  PERSONAL FACTORS: Past/current experiences, Time since onset of injury/illness/exacerbation, and 3+ comorbidities: anxiety, asthma, DM-II, HTN, CVA, DVT, POTS, pacemaker, malignant melanoma with brain, liver and lung metastasis s/p radiation and L parietal craniotomy, osteopenia, diverticulitis, GERD, and Raynaud's disease  are also affecting patient's functional outcome.   REHAB POTENTIAL: Good  CLINICAL DECISION MAKING: Evolving/moderate complexity  EVALUATION COMPLEXITY: Moderate   GOALS: Goals reviewed with patient? Yes  SHORT TERM GOALS:  Target date: 06/25/2022   Patient will be independent with initial HEP. Baseline: Patient currently performing HH PT HEP Goal status: MET  07/04/22  2.  Patient will improve 5x STS time to </= 15 seconds for improved efficiency and safety with transfers Baseline: 20.79 sec Goal status: MET  07/04/22  LONG TERM GOALS: Target date: 07/16/2022, extended to 08/20/2022, extended to 10/01/2022    Patient will be independent with advanced/ongoing HEP to improve outcomes and carryover.  Baseline:  Goal status: IN PROGRESS  08/13/22 - reviewed and updated today  2.  Patient  will report at least 50-75% improvement in R hip and knee pain to improve QOL. Baseline:  Goal status: MET  07/09/22  3.  Patient will demonstrate improved B proximal LE strength to >/= 4+/5 for improved stability and ease of mobility. Baseline: Refer to above MMT table Goal status: IN PROGRESS  08/13/22 - ongoing proximal LE weakness R>L  4.  Patient will be able to ambulate 600' with or w/o SPC and normal gait pattern without increased pain to access community.  Baseline:  Goal status: MET  07/09/22   5.  Patient will report >/= 31/80 on LEFS to demonstrate improved functional ability. Baseline: 22 / 80 = 27.5 % Goal status: MET  08/20/22 - 57 / 80 = 71.3 %  6.  Patient will demonstrate at least 20/24 on FGA to decrease risk of falls. Baseline: 16/30 Goal status: MET  07/09/22 - 25/30 FGA   7.  Patient will be able to safely transition from floor to standing w/o need for external support. Baseline: Assist necessary from PT to stabilize upon coming to stand Goal status: INITIAL    PLAN:  PT FREQUENCY: 1x/week   PT DURATION: 4-6 weeks  PLANNED INTERVENTIONS: Therapeutic exercises, Therapeutic activity, Neuromuscular re-education, Balance training, Gait training, Patient/Family education, Self Care, Joint mobilization, Stair training, DME instructions, Dry Needling, Electrical stimulation, Cryotherapy, Moist heat, Taping,  Vasopneumatic device, Ultrasound, Ionotophoresis 4mg /ml Dexamethasone, Manual therapy, and Re-evaluation  PLAN FOR NEXT SESSION: progress proximal LE strengthening incorporating functional strengthening and dynamic balance activities with SLS and narrow BOS exercises   Marry Guan, PT 08/20/2022, 3:13 PM    PHYSICAL THERAPY DISCHARGE SUMMARY  Visits from Start of Care: 9  Current functional level related to goals / functional outcomes: Refer to above clinical impression and goal assessment for status as of recert visit on 08/20/22. Patient started PT on 08/31/22 for LE lymphedema and has not returned to this clinic in >30 days, therefore will proceed with discharge from PT for this episode. New orders received today for PT to "Eval and Treat left troch bursitis/IT band" with "Dry needling", therefore new eval will be scheduled accordingly.   Remaining deficits: As above.   Education / Equipment: HEP   Patient agrees to discharge. Patient goals were partially met. Patient is being discharged due to  not returning to this clinic in >30 days.   Marry Guan, PT 10/09/22, 12:00 PM  [facility] [address]  [address] [place], [name], [address] Phone: [phone]   Fax:  [phone]

## 2022-08-31 ENCOUNTER — Other Ambulatory Visit: Payer: Self-pay

## 2022-08-31 ENCOUNTER — Encounter: Payer: Self-pay | Admitting: Physical Therapy

## 2022-08-31 ENCOUNTER — Ambulatory Visit: Payer: Medicare Other | Attending: Orthopaedic Surgery | Admitting: Physical Therapy

## 2022-08-31 DIAGNOSIS — I890 Lymphedema, not elsewhere classified: Secondary | ICD-10-CM | POA: Diagnosis not present

## 2022-08-31 DIAGNOSIS — R262 Difficulty in walking, not elsewhere classified: Secondary | ICD-10-CM | POA: Insufficient documentation

## 2022-08-31 DIAGNOSIS — C439 Malignant melanoma of skin, unspecified: Secondary | ICD-10-CM | POA: Insufficient documentation

## 2022-08-31 DIAGNOSIS — Z96641 Presence of right artificial hip joint: Secondary | ICD-10-CM | POA: Insufficient documentation

## 2022-08-31 DIAGNOSIS — M7989 Other specified soft tissue disorders: Secondary | ICD-10-CM | POA: Insufficient documentation

## 2022-08-31 NOTE — Therapy (Signed)
OUTPATIENT PHYSICAL THERAPY ONCOLOGY EVALUATION  Patient Name: [name] MRN: [id number] DOB:1955-04-07, 67 y.o., female Today's Date: 08/31/2022  END OF SESSION:  PT End of Session - 08/31/22 1124     Visit Number 1   1 for cancer rehab   Number of Visits 18   18 for cancer rehab   Date for PT Re-Evaluation 10/12/22   for cancer rehab   Authorization Type [employer] Medicare    PT Start Time 1013   pt arrived late   PT Stop Time 1110    PT Time Calculation (min) 57 min    Activity Tolerance Patient tolerated treatment well    Behavior During Therapy WFL for tasks assessed/performed             Past Medical History:  Diagnosis Date   Anemia    Angio-edema    Anxiety    Arthritis    Asthma    Atrial fibrillation (HCC)    Atrial tachycardia    Autonomic dysfunction    Complication of anesthesia    pt states wakes up with "shakes"   CVA (cerebral infarction)    2012 with dizziness and vision change felt embolic from atrial tachy   Disorder of bone and cartilage, unspecified    Diverticulosis    DM (diabetes mellitus) (HCC)    DVT (deep venous thrombosis) (HCC)    Eczema    Family history of anesthesia complication    PONV and " shaking "   Fatty liver    GERD (gastroesophageal reflux disease)    History of hiatal hernia    History of radiation therapy 10/24/2012   brain   HLD (hyperlipidemia)    HTN (hypertension)    Hypoglycemia, unspecified    Liver metastasis    Lung metastasis    Metastasis to brain (HCC)    Osteopenia    Other nonspecific abnormal serum enzyme levels    Other specified congenital anomalies of nervous system    Pacemaker    autonomic dysfunction   Pancreatitis    from therapy   Peripheral vascular disease (HCC)    POTS (postural orthostatic tachycardia syndrome)    Raynaud's disease    due to chemo   Skin cancer    Hx: of lung lesion   Past Surgical History:  Procedure Laterality Date   ABLATION SAPHENOUS VEIN W/ RFA     2002  x3   CARPAL TUNNEL RELEASE Left 2000   CHOLECYSTECTOMY N/A 03/28/2018   Procedure: LAPAROSCOPIC CHOLECYSTECTOMY WITH INTRAOPERATIVE CHOLANGIOGRAM;  Surgeon: Luretha Murphy, MD;  Location: [facility] OR;  Service: General;  Laterality: N/A;   CRANIOTOMY N/A 09/30/2012   suboccipital craniectomy   CRANIOTOMY Left 02/09/2013   Procedure: LEFT PARIETAL CRANIOTOMY with stealth;  Surgeon: Barnett Abu, MD;  Location: [facility];  Service: Neurosurgery;  Laterality: Left;  LEFT Parietal Craniotomy for tumor with stealth   DEEP AXILLARY SENTINEL NODE BIOPSY / EXCISION     due to extensive Melanoma-right arm   fatty tumor removed  2000   from chest   INSERT / REPLACE / REMOVE PACEMAKER  2012   1999, x 3   LAPAROSCOPY  09/06/2011   Procedure: LAPAROSCOPY OPERATIVE;  Surgeon: Tresa Endo A. Ernestina Penna, MD;  Location: [facility];  Service: Gynecology;  Laterality: Left;  with Left Ovarian Cystectomy    MELANOMA EXCISION     with removal of lymph nodes, left shoulder   NOSE SURGERY  2010, 2012   for nose bleeds x 2  SINOSCOPY     TONSILLECTOMY AND ADENOIDECTOMY     TOTAL HIP ARTHROPLASTY Right 05/18/2022   Procedure: RIGHT TOTAL HIP ARTHROPLASTY ANTERIOR APPROACH;  Surgeon: Kathryne Hitch, MD;  Location: [facility];  Service: Orthopedics;  Laterality: Right;   Patient Active Problem List   Diagnosis Date Noted   Status post total replacement of right hip 05/18/2022   Avascular necrosis of bone of right hip (HCC) 04/19/2022   Moderate persistent asthma 12/08/2018   Perennial allergic rhinitis with predominantly nonallergic component 12/08/2018   Allergic conjunctivitis 12/08/2018   History of epistaxis 12/08/2018   Atopic dermatitis 12/08/2018   Recurrent urticaria 12/08/2018   Skeeter syndrome 12/08/2018   Skin lesion of chest wall 07/22/2018   S/P laparoscopic cholecystectomy 03/28/2018   Dehydration 05/18/2016   Type 2 diabetes mellitus without complication, without long-term current use of insulin  (HCC) 11/01/2015   History of pacemaker    POTS (postural orthostatic tachycardia syndrome)    Iron deficiency anemia 11/11/2014   Chronic colitis 08/28/2013   Malignant melanoma, metastatic (HCC) 02/09/2013   Malignant melanoma (HCC) 12/01/2012   Brain metastasis 10/16/2012   Hypertension 09/30/2012   Anxiety state, unspecified 09/30/2012   Hypoxemia 09/30/2012   Disorder of brain 09/08/2012   Dysautonomia (HCC) 02/25/2011   Anticoagulant long-term use 01/19/2011   Neuritis 01/19/2011   Skin change 01/19/2011   CVA (cerebral infarction)    Palpitation 06/28/2010   Pacemaker-Biotronik 06/28/2010   Dizziness and giddiness 04/27/2010   DIVERTICULOSIS, COLON, HX OF 01/19/2009   CAROTID SINUS SYNDROME 11/17/2008   OTHER MALAISE AND FATIGUE 11/17/2008   HYPERTENSION, BENIGN 11/02/2008   ATRIAL TACHYCARDIA 09/16/2008   SYNCOPE, HX OF 08/09/2008   INJURY UNSPEC NERVE SHOULDER GIRDLE&UPPER LIMB 07/12/2008   PERSONAL HISTORY OF MALIGNANT MELANOMA OF SKIN 07/12/2008   LUQ PAIN 12/08/2007   NECK PAIN 09/26/2007   NUMBNESS, ARM 09/26/2007   CPK, ABNORMAL 09/26/2007   ARM PAIN, LEFT 06/25/2007   SWELLING OF LIMB 06/25/2007   OSTEOPENIA 06/25/2007    PCP: Berniece Andreas, MD  REFERRING PROVIDER: Doneen Poisson, MD  REFERRING DIAG:  M79.89 (ICD-10-CM) - Swelling of limb C43.9 (ICD-10-CM) - Malignant melanoma, metastatic (HCC) Z96.641 (ICD-10-CM) - Status post total replacement of right hip    THERAPY DIAG:  Lymphedema, not elsewhere classified  Difficulty in walking, not elsewhere classified  Malignant melanoma, unspecified site [facility])  ONSET DATE: 05/18/22  Rationale for Evaluation and Treatment: Rehabilitation  SUBJECTIVE:  SUBJECTIVE STATEMENT: In 2009 I had a melanoma lesion removed  from my R UE and they took all the lymph nodes. August 29, 2012 I was diagnosed with a brain tumor. I had brain sugery to remove the tumor and it was melanoma. During that surgery they found another tumor. I had radiation to both tumors but they both got worse so they had to do surgery. Lymph nodes were taken from the back of my neck. I had a tumor removed from my groin around 2016 or 2017 and they took 2 lymph nodes. Then I had my R hip replacement done 05/18/22. I have lymphedema on the whole R side. I can tell a difference in clothes and in the size of my legs on my R side compared to L side. My L leg seems to swell too and my doctor said the L hip also has avascular necrosis.   PERTINENT HISTORY:  Hx of metastatic melanoma beginning in 2009 with lesion removed from RUE and ALND, 2014 metastasized to brain and underwent surgery and radiation, tumor removed from R groin in  2016 or 2017 (pt unable to recall) and 2 nodes removed, R THA 05/18/22, PMH includes but is not limited to: anxiety, asthma, DM-II, HTN, CVA, DVT, POTS, pacemaker, malignant melanoma with brain, liver and lung metastasis s/p radiation and L parietal craniotomy, osteopenia, diverticulitis, GERD, and Raynaud's disease.  PAIN:  Are you having pain? Yes NPRS scale: 8/10 Pain location: R thigh/hip Pain orientation: Right  PAIN TYPE: aching and heavy Pain description: constant  Aggravating factors: trying to sleep at night after a lot of activity Relieving factors: nothing  PRECAUTIONS: Anterior hip and ICD/Pacemaker  WEIGHT BEARING RESTRICTIONS: No  FALLS:  Has patient fallen in last 6 months? 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  LEISURE: walks 3-4 miles several times a week, does all the hip strengthening exercises  from rehab  HAND DOMINANCE: right   PRIOR LEVEL OF FUNCTION: Independent except when getting something off the floor  PATIENT GOALS: to go back to normal, decrease swelling and pain   OBJECTIVE:  COGNITION: Overall cognitive status: Within functional limits for tasks assessed   PALPATION: Fibrosis near lateral hip in area of edema  OBSERVATIONS / OTHER ASSESSMENTS: R LE visibly more full from knee to groin compared to L LE   UPPER EXTREMITY AROM/PROM: [facility]    LYMPHEDEMA ASSESSMENTS:   SURGERY TYPE/DATE: numerous surgeries, R ALND in 2009, surgery to remove R groin tumor in 2016 or 2017  NUMBER OF LYMPH NODES REMOVED: ALND in 2009, 2 nodes in 2016/2017 from R groin  RADIATION:completed to brain   LYMPHEDEMA ASSESSMENTS:   LANDMARK RIGHT  eval  10 cm proximal to olecranon process   Olecranon process   10 cm proximal to ulnar styloid process   Just proximal to ulnar styloid process   Across hand at thumb web space   At base of 2nd digit   (Blank rows = not tested)  LANDMARK LEFT  eval  10 cm proximal to olecranon process   Olecranon process   10 cm proximal to ulnar styloid process   Just proximal to ulnar styloid process   Across hand at thumb web space   At base of 2nd digit   (Blank rows = not tested)  LOWER EXTREMITY LANDMARK RIGHT eval  At groin   30 cm proximal to suprapatella 59.4  20 cm proximal to suprapatella 56  10 cm proximal to suprapatella 45.5  At midpatella / popliteal crease 38  30 cm proximal to floor at lateral plantar foot 40.4  20 cm proximal to floor at lateral plantar foot 31.2  10 cm proximal to floor at lateral plantar foot 21.7  Circumference of ankle/heel   5 cm proximal to 1st MTP joint 22.1  Across MTP joint 23.5  Around proximal great toe 7.9  (Blank rows = not tested)  LOWER EXTREMITY LANDMARK LEFT eval  At groin   30 cm proximal to suprapatella 56  20 cm proximal to suprapatella 52.3  10 cm proximal to  suprapatella 44.9  At midpatella / popliteal crease 38.4  30 cm proximal to floor at lateral plantar foot 38.5  20 cm proximal to floor at lateral plantar foot 30  10 cm proximal to floor at lateral plantar foot 21.5  Circumference of ankle/heel   5 cm proximal to 1st MTP joint 21.9  Across MTP joint 22.3  Around proximal great toe 7  (Blank rows = not tested)  LLIS: 72  TODAY'S TREATMENT:                                                                                                                                         DATE:  08/31/22: Educated pt and spouse on anatomy and physiology of the lymphatic system and what to expect with CDT, educated to purchase post op shoe to wear with bandaging, educated to decrease exercise some due to pain in evening after walking 3-4 miles as a result of edema  PATIENT EDUCATION:  Education details: Educated pt and spouse on anatomy and physiology of the lymphatic system and what to expect with CDT, educated to purchase post op shoe to wear with bandaging, educated to decrease exercise some due to pain in evening after walking 3-4 miles as a result of edema Person educated: Patient and Spouse Education method: Explanation Education comprehension: verbalized understanding  HOME EXERCISE PROGRAM: Wear bike shorts for hip swelling Wear compression leggings when exercising  ASSESSMENT:  CLINICAL IMPRESSION: Patient is a 67 y.o. female who was seen today for physical therapy evaluation and treatment for R LE lymphedema. Pt has metastatic melanoma and had surgery to remove a tumor in her R groin as well as 2 lymph nodes about 8 years ago. She had a R THA in March of 2024 and has had swelling that has not resolved since that time. She now has lymphedema of her RLE. She likes to exercise and walks 3-4 miles several times a week and is unable to sleep on the nights she walks due to discomfort and heaviness in her leg. She has tried walking with bike shorts or  compression shorts but found this pushed fluid down  towards her foot. She also has a history of an ALND on her UEs and is at risk for lymphedema in her R UE. Pt would benefit from skilled PT services to decrease R LE lymphedema, assist pt with obtaining compression garments for her R LE and her R UE, and progress pt towards independent management of her lymphedema.   OBJECTIVE IMPAIRMENTS: decreased knowledge of condition, decreased knowledge of use of DME, increased edema, and pain.   ACTIVITY LIMITATIONS: squatting and sleeping  PARTICIPATION LIMITATIONS: community activity and exercise  PERSONAL FACTORS: Past/current experiences, Time since onset of injury/illness/exacerbation, and 1 comorbidity: history of R ALND  are also affecting patient's functional outcome.   REHAB POTENTIAL: Good  CLINICAL DECISION MAKING: Stable/uncomplicated  EVALUATION COMPLEXITY: Low  GOALS: Goals reviewed with patient? Yes  SHORT TERM GOALS: Target date: 09/28/22  Pt and/or spouse will be independent with compression bandaging for long term management of lymphedema.  Baseline: Goal status: INITIAL  2.  Pt and/or spouse will be independent in self MLD for long term management of lymphedema.  Baseline:  Goal status: INITIAL  3.  Pt will demonstrate a 1 cm decrease in edema at 30 cm superior to patella to decrease risk of infection.  Baseline:  Goal status: INITIAL    LONG TERM GOALS: Target date: 10/12/22  Pt will obtain appropriate compression garments for RLE lymphedema and for prophylactic use on RUE.  Baseline:  Goal status: INITIAL  2.  Pt will be able to sleep through the night after exercising without increase LE discomfort from lymphedema.  Baseline:  Goal status: INITIAL   PLAN:  PT FREQUENCY: 3x/week  PT DURATION: 6 weeks  PLANNED INTERVENTIONS: Therapeutic exercises, Therapeutic activity, Patient/Family education, Self Care, Joint mobilization, Orthotic/Fit training, Manual  lymph drainage, Compression bandaging, scar mobilization, Taping, Vasopneumatic device, Manual therapy, and Re-evaluation  PLAN FOR NEXT SESSION: decrease to 1-2x/wk once pt and or spouse is indep with MLD and bandaging, begin CDT on RLE ++++ has hx of R ALND, only use interinguinal pathway++++ instruct in bandaging to begin with and then MLD, eventually measure for prophylactic R UE sleeve and then R LE garment    [employer], PT 08/31/2022, 12:21 PM

## 2022-09-03 ENCOUNTER — Encounter: Payer: Self-pay | Admitting: Physical Therapy

## 2022-09-03 ENCOUNTER — Ambulatory Visit: Payer: Medicare Other | Admitting: Physical Therapy

## 2022-09-03 DIAGNOSIS — R262 Difficulty in walking, not elsewhere classified: Secondary | ICD-10-CM | POA: Diagnosis not present

## 2022-09-03 DIAGNOSIS — I890 Lymphedema, not elsewhere classified: Secondary | ICD-10-CM | POA: Diagnosis not present

## 2022-09-03 DIAGNOSIS — C439 Malignant melanoma of skin, unspecified: Secondary | ICD-10-CM | POA: Diagnosis not present

## 2022-09-03 DIAGNOSIS — Z96641 Presence of right artificial hip joint: Secondary | ICD-10-CM | POA: Diagnosis not present

## 2022-09-03 DIAGNOSIS — M7989 Other specified soft tissue disorders: Secondary | ICD-10-CM | POA: Diagnosis not present

## 2022-09-03 NOTE — Therapy (Signed)
OUTPATIENT PHYSICAL THERAPY ONCOLOGY EVALUATION  Patient Name: [name] MRN: [id number] DOB:[birth year], 67 y.o., female Today's Date: 09/03/2022  END OF SESSION:  PT End of Session - 09/03/22 1052     Visit Number 2   for cancer rehab   Number of Visits 18   for cancer rehab   Date for PT Re-Evaluation 10/12/22   for cancer rehab   PT Start Time 1002    PT Stop Time 1050    PT Time Calculation (min) 48 min    Activity Tolerance Patient tolerated treatment well    Behavior During Therapy WFL for tasks assessed/performed              Past Medical History:  Diagnosis Date   Anemia    Angio-edema    Anxiety    Arthritis    Asthma    Atrial fibrillation (HCC)    Atrial tachycardia    Autonomic dysfunction    Complication of anesthesia    pt states wakes up with "shakes"   CVA (cerebral infarction)    2012 with dizziness and vision change felt embolic from atrial tachy   Disorder of bone and cartilage, unspecified    Diverticulosis    DM (diabetes mellitus) (HCC)    DVT (deep venous thrombosis) (HCC)    Eczema    Family history of anesthesia complication    PONV and " shaking "   Fatty liver    GERD (gastroesophageal reflux disease)    History of hiatal hernia    History of radiation therapy 10/24/2012   brain   HLD (hyperlipidemia)    HTN (hypertension)    Hypoglycemia, unspecified    Liver metastasis    Lung metastasis    Metastasis to brain (HCC)    Osteopenia    Other nonspecific abnormal serum enzyme levels    Other specified congenital anomalies of nervous system    Pacemaker    autonomic dysfunction   Pancreatitis    from therapy   Peripheral vascular disease (HCC)    POTS (postural orthostatic tachycardia syndrome)    Raynaud's disease    due to chemo   Skin cancer    Hx: of lung lesion   Past Surgical History:  Procedure Laterality Date   ABLATION SAPHENOUS VEIN W/ RFA     2002 x3   CARPAL TUNNEL RELEASE Left 2000   CHOLECYSTECTOMY  N/A 03/28/2018   Procedure: LAPAROSCOPIC CHOLECYSTECTOMY WITH INTRAOPERATIVE CHOLANGIOGRAM;  Surgeon: Luretha Murphy, MD;  Location: [facility] OR;  Service: General;  Laterality: N/A;   CRANIOTOMY N/A 09/30/2012   suboccipital craniectomy   CRANIOTOMY Left 02/09/2013   Procedure: LEFT PARIETAL CRANIOTOMY with stealth;  Surgeon: Barnett Abu, MD;  Location: [facility];  Service: Neurosurgery;  Laterality: Left;  LEFT Parietal Craniotomy for tumor with stealth   DEEP AXILLARY SENTINEL NODE BIOPSY / EXCISION     due to extensive Melanoma-right arm   fatty tumor removed  2000   from chest   INSERT / REPLACE / REMOVE PACEMAKER  2012   1999, x 3   LAPAROSCOPY  09/06/2011   Procedure: LAPAROSCOPY OPERATIVE;  Surgeon: Tresa Endo A. Ernestina Penna, MD;  Location: [facility] ORS;  Service: Gynecology;  Laterality: Left;  with Left Ovarian Cystectomy    MELANOMA EXCISION     with removal of lymph nodes, left shoulder   NOSE SURGERY  2010, 2012   for nose bleeds x 2   SINOSCOPY     TONSILLECTOMY AND ADENOIDECTOMY  TOTAL HIP ARTHROPLASTY Right 05/18/2022   Procedure: RIGHT TOTAL HIP ARTHROPLASTY ANTERIOR APPROACH;  Surgeon: Kathryne Hitch, MD;  Location: [facility]S;  Service: Orthopedics;  Laterality: Right;   Patient Active Problem List   Diagnosis Date Noted   Status post total replacement of right hip 05/18/2022   Avascular necrosis of bone of right hip (HCC) 04/19/2022   Moderate persistent asthma 12/08/2018   Perennial allergic rhinitis with predominantly nonallergic component 12/08/2018   Allergic conjunctivitis 12/08/2018   History of epistaxis 12/08/2018   Atopic dermatitis 12/08/2018   Recurrent urticaria 12/08/2018   Skeeter syndrome 12/08/2018   Skin lesion of chest wall 07/22/2018   S/P laparoscopic cholecystectomy 03/28/2018   Dehydration 05/18/2016   Type 2 diabetes mellitus without complication, without long-term current use of insulin (HCC) 11/01/2015   History of pacemaker    POTS  (postural orthostatic tachycardia syndrome)    Iron deficiency anemia 11/11/2014   Chronic colitis 08/28/2013   Malignant melanoma, metastatic (HCC) 02/09/2013   Malignant melanoma (HCC) 12/01/2012   Brain metastasis 10/16/2012   Hypertension 09/30/2012   Anxiety state, unspecified 09/30/2012   Hypoxemia 09/30/2012   Disorder of brain 09/08/2012   Dysautonomia (HCC) 02/25/2011   Anticoagulant long-term use 01/19/2011   Neuritis 01/19/2011   Skin change 01/19/2011   CVA (cerebral infarction)    Palpitation 06/28/2010   Pacemaker-Biotronik 06/28/2010   Dizziness and giddiness 04/27/2010   DIVERTICULOSIS, COLON, HX OF 01/19/2009   CAROTID SINUS SYNDROME 11/17/2008   OTHER MALAISE AND FATIGUE 11/17/2008   HYPERTENSION, BENIGN 11/02/2008   ATRIAL TACHYCARDIA 09/16/2008   SYNCOPE, HX OF 08/09/2008   INJURY UNSPEC NERVE SHOULDER GIRDLE&UPPER LIMB 07/12/2008   PERSONAL HISTORY OF MALIGNANT MELANOMA OF SKIN 07/12/2008   LUQ PAIN 12/08/2007   NECK PAIN 09/26/2007   NUMBNESS, ARM 09/26/2007   CPK, ABNORMAL 09/26/2007   ARM PAIN, LEFT 06/25/2007   SWELLING OF LIMB 06/25/2007   OSTEOPENIA 06/25/2007    PCP: Berniece Andreas, MD  REFERRING PROVIDER: Doneen Poisson, MD  REFERRING DIAG:  M79.89 (ICD-10-CM) - Swelling of limb C43.9 (ICD-10-CM) - Malignant melanoma, metastatic (HCC) Z96.641 (ICD-10-CM) - Status post total replacement of right hip    THERAPY DIAG:  Lymphedema, not elsewhere classified  Difficulty in walking, not elsewhere classified  Malignant melanoma, unspecified site [facility])  ONSET DATE: 05/18/22  Rationale for Evaluation and Treatment: Rehabilitation  SUBJECTIVE:                                                                                                                                                                                           SUBJECTIVE STATEMENT: All the post  op shoes are on backorder but they have them at [facility] so I will go when  I leave.   PERTINENT HISTORY:  Hx of metastatic melanoma beginning in 2009 with lesion removed from RUE and ALND, 2014 metastasized to brain and underwent surgery and radiation, tumor removed from R groin in  2016 or 2017 (pt unable to recall) and 2 nodes removed, R THA 05/18/22, PMH includes but is not limited to: anxiety, asthma, DM-II, HTN, CVA, DVT, POTS, pacemaker, malignant melanoma with brain, liver and lung metastasis s/p radiation and L parietal craniotomy, osteopenia, diverticulitis, GERD, and Raynaud's disease.  PAIN:  Are you having pain? Yes NPRS scale: 6/10 Pain location: R thigh/hip Pain orientation: Right  PAIN TYPE: aching and heavy Pain description: constant  Aggravating factors: trying to sleep at night after a lot of activity Relieving factors: nothing  PRECAUTIONS: Anterior hip and ICD/Pacemaker  WEIGHT BEARING RESTRICTIONS: No  FALLS:  Has patient fallen in last 6 months? 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  LEISURE: walks 3-4 miles several times a week, does all the hip strengthening exercises from rehab  HAND DOMINANCE: right   PRIOR LEVEL OF FUNCTION: Independent except when getting something off the floor  PATIENT GOALS: to go back to normal, decrease swelling and pain   OBJECTIVE:  COGNITION: Overall cognitive status: Within functional limits for tasks assessed   PALPATION: Fibrosis near lateral hip in area of edema  OBSERVATIONS / OTHER ASSESSMENTS: R LE visibly more full from knee to groin compared to L LE   UPPER EXTREMITY AROM/PROM: [facility]    LYMPHEDEMA ASSESSMENTS:   SURGERY TYPE/DATE: numerous surgeries, R ALND in 2009, surgery to remove R groin tumor in 2016 or 2017  NUMBER OF LYMPH NODES REMOVED: ALND in 2009, 2 nodes in  2016/2017 from R groin  RADIATION:completed to brain   LYMPHEDEMA ASSESSMENTS:   LANDMARK RIGHT  eval  10 cm proximal to olecranon process   Olecranon process   10 cm proximal to ulnar styloid process   Just proximal to ulnar styloid process   Across hand at thumb web space   At base of 2nd digit   (Blank rows = not tested)  LANDMARK LEFT  eval  10 cm proximal to olecranon process   Olecranon process   10 cm proximal to ulnar styloid process   Just proximal to ulnar styloid process   Across hand at thumb web space   At base of 2nd digit   (Blank rows = not tested)   LOWER EXTREMITY LANDMARK RIGHT eval  At groin   30 cm proximal to suprapatella 59.4  20 cm proximal to suprapatella 56  10 cm proximal to suprapatella 45.5  At midpatella / popliteal crease 38  30 cm proximal to floor at lateral plantar foot 40.4  20 cm proximal to floor at lateral plantar foot 31.2  10 cm proximal to floor at lateral plantar foot 21.7  Circumference of ankle/heel   5 cm proximal to 1st MTP joint 22.1  Across MTP joint 23.5  Around proximal great toe 7.9  (Blank rows = not tested)  LOWER EXTREMITY LANDMARK LEFT eval  At groin   30 cm proximal to suprapatella 56  20 cm proximal to suprapatella 52.3  10 cm proximal to suprapatella 44.9  At midpatella / popliteal crease 38.4  30 cm proximal to floor at lateral plantar foot 38.5  20 cm proximal to floor at lateral plantar foot 30  10 cm proximal to floor at lateral plantar foot 21.5  Circumference of ankle/heel   5 cm proximal to 1st MTP joint 21.9  Across MTP joint 22.3  Around proximal great toe 7  (Blank rows = not tested)  LLIS: 72  TODAY'S TREATMENT:                                                                                                                                         DATE:  09/03/22: Began instructing pt and spouse in compression bandaging technique to RLE as follows and pt's spouse took a video on his phone  to practice at home: TG soft small from foot to knee, TG soft medium from knee the thigh, Elastomull to digits 1-4, artiflex from foot to knee and then from knee to thigh, 1 6 cm bandage at foot, 1 8 cm heel/ankle/sole, 1 10 cm from ankle to knee, 1 12 cm bandage from mid lower leg to above knee with "x" at knee, 1 12 cm bandage from knee to groin in herringbone, 1 12 cm bandage from above knee to groin. Educated pt to wear her spanx compression shorts on top to provide compression to her hip where she has increased swelling. Put shoe covers on R foot and she is going to get a post op shoe at [facility] after this appointment. Educated pt in walking with cane with bandages intact for balance. Practiced ascending/descending stairs with cane and bandages intact to ensure pt felt safe. She did well with all. Will have pt's spouse return demo bandaging at next session.   08/31/22: Educated pt and spouse on anatomy and physiology of the lymphatic system and what to expect with CDT, educated to purchase post op shoe to wear with bandaging, educated to decrease exercise some due to pain in evening after walking 3-4 miles as a result of edema  PATIENT EDUCATION:  Education details: compression bandaging technique, remove bandages if foot turns blue or becomes numb and does not improve with movement, encouraged to move and exercise with bandages intact Person educated: Patient and Spouse Education method: Explanation Education comprehension: verbalized understanding  HOME EXERCISE PROGRAM: Wear bike shorts for hip swelling Wear compression leggings when exercising  ASSESSMENT:  CLINICAL IMPRESSION: Instructed pt and spouse in proper way to don compression bandages. Instructed pt to remove bandages if her leg becomes blue or numb and doesn't improve with movement. Pt spouse will attempt to bandage prior to her next appointment so therapist can assess and next appointment therapist will have pt's spouse  return demo compression bandaging. She was educated to don her compression shorts to help manage her hip swelling especially while bandages are intact.   OBJECTIVE IMPAIRMENTS: decreased knowledge of  condition, decreased knowledge of use of DME, increased edema, and pain.   ACTIVITY LIMITATIONS: squatting and sleeping  PARTICIPATION LIMITATIONS: community activity and exercise  PERSONAL FACTORS: Past/current experiences, Time since onset of injury/illness/exacerbation, and 1 comorbidity: history of R ALND  are also affecting patient's functional outcome.   REHAB POTENTIAL: Good  CLINICAL DECISION MAKING: Stable/uncomplicated  EVALUATION COMPLEXITY: Low  GOALS: Goals reviewed with patient? Yes  SHORT TERM GOALS: Target date: 09/28/22  Pt and/or spouse will be independent with compression bandaging for long term management of lymphedema.  Baseline: Goal status: INITIAL  2.  Pt and/or spouse will be independent in self MLD for long term management of lymphedema.  Baseline:  Goal status: INITIAL  3.  Pt will demonstrate a 1 cm decrease in edema at 30 cm superior to patella to decrease risk of infection.  Baseline:  Goal status: INITIAL    LONG TERM GOALS: Target date: 10/12/22  Pt will obtain appropriate compression garments for RLE lymphedema and for prophylactic use on RUE.  Baseline:  Goal status: INITIAL  2.  Pt will be able to sleep through the night after exercising without increase LE discomfort from lymphedema.  Baseline:  Goal status: INITIAL   PLAN:  PT FREQUENCY: 3x/week  PT DURATION: 6 weeks  PLANNED INTERVENTIONS: Therapeutic exercises, Therapeutic activity, Patient/Family education, Self Care, Joint mobilization, Orthotic/Fit training, Manual lymph drainage, Compression bandaging, scar mobilization, Taping, Vasopneumatic device, Manual therapy, and Re-evaluation  PLAN FOR NEXT SESSION: decrease to 1-2x/wk once pt and or spouse is indep with MLD and  bandaging, begin CDT on RLE ++++ has hx of R ALND, only use interinguinal pathway++++ instruct in bandaging to begin with and then MLD, eventually measure for prophylactic R UE sleeve and then R LE garment    [employer], PT 09/03/2022, 11:10 AM

## 2022-09-04 ENCOUNTER — Ambulatory Visit: Payer: Medicare Other | Attending: Orthopaedic Surgery

## 2022-09-05 ENCOUNTER — Encounter: Payer: Self-pay | Admitting: Physical Therapy

## 2022-09-05 ENCOUNTER — Ambulatory Visit: Payer: Medicare Other | Admitting: Physical Therapy

## 2022-09-05 DIAGNOSIS — R262 Difficulty in walking, not elsewhere classified: Secondary | ICD-10-CM | POA: Diagnosis not present

## 2022-09-05 DIAGNOSIS — C439 Malignant melanoma of skin, unspecified: Secondary | ICD-10-CM | POA: Diagnosis not present

## 2022-09-05 DIAGNOSIS — Z96641 Presence of right artificial hip joint: Secondary | ICD-10-CM | POA: Diagnosis not present

## 2022-09-05 DIAGNOSIS — I890 Lymphedema, not elsewhere classified: Secondary | ICD-10-CM

## 2022-09-05 DIAGNOSIS — M7989 Other specified soft tissue disorders: Secondary | ICD-10-CM | POA: Diagnosis not present

## 2022-09-05 NOTE — Therapy (Signed)
OUTPATIENT PHYSICAL THERAPY ONCOLOGY TREATMENT  Patient Name: [name] MRN: [id number] DOB:[birth year], 67 y.o., female Today's Date: 09/05/2022  END OF SESSION:  PT End of Session - 09/05/22 1254     Visit Number 3   for cancer rehab   Number of Visits 18   for cancer rehab   Date for PT Re-Evaluation 10/12/22   for cancer rehab   Authorization Type [employer] Medicare    PT Start Time 1205    PT Stop Time 1245    PT Time Calculation (min) 40 min    Activity Tolerance Patient tolerated treatment well    Behavior During Therapy WFL for tasks assessed/performed               Past Medical History:  Diagnosis Date   Anemia    Angio-edema    Anxiety    Arthritis    Asthma    Atrial fibrillation (HCC)    Atrial tachycardia    Autonomic dysfunction    Complication of anesthesia    pt states wakes up with "shakes"   CVA (cerebral infarction)    2012 with dizziness and vision change felt embolic from atrial tachy   Disorder of bone and cartilage, unspecified    Diverticulosis    DM (diabetes mellitus) (HCC)    DVT (deep venous thrombosis) (HCC)    Eczema    Family history of anesthesia complication    PONV and " shaking "   Fatty liver    GERD (gastroesophageal reflux disease)    History of hiatal hernia    History of radiation therapy 10/24/2012   brain   HLD (hyperlipidemia)    HTN (hypertension)    Hypoglycemia, unspecified    Liver metastasis    Lung metastasis    Metastasis to brain (HCC)    Osteopenia    Other nonspecific abnormal serum enzyme levels    Other specified congenital anomalies of nervous system    Pacemaker    autonomic dysfunction   Pancreatitis    from therapy   Peripheral vascular disease (HCC)    POTS (postural orthostatic tachycardia syndrome)    Raynaud's disease    due to chemo   Skin cancer    Hx: of lung lesion   Past Surgical History:  Procedure Laterality Date   ABLATION SAPHENOUS VEIN W/ RFA     2002 x3   CARPAL  TUNNEL RELEASE Left 2000   CHOLECYSTECTOMY N/A 03/28/2018   Procedure: LAPAROSCOPIC CHOLECYSTECTOMY WITH INTRAOPERATIVE CHOLANGIOGRAM;  Surgeon: Luretha Murphy, MD;  Location: [facility] OR;  Service: General;  Laterality: N/A;   CRANIOTOMY N/A 09/30/2012   suboccipital craniectomy   CRANIOTOMY Left 02/09/2013   Procedure: LEFT PARIETAL CRANIOTOMY with stealth;  Surgeon: Barnett Abu, MD;  Location: [facility];  Service: Neurosurgery;  Laterality: Left;  LEFT Parietal Craniotomy for tumor with stealth   DEEP AXILLARY SENTINEL NODE BIOPSY / EXCISION     due to extensive Melanoma-right arm   fatty tumor removed  2000   from chest   INSERT / REPLACE / REMOVE PACEMAKER  2012   1999, x 3   LAPAROSCOPY  09/06/2011   Procedure: LAPAROSCOPY OPERATIVE;  Surgeon: Tresa Endo A. Ernestina Penna, MD;  Location: [facility];  Service: Gynecology;  Laterality: Left;  with Left Ovarian Cystectomy    MELANOMA EXCISION     with removal of lymph nodes, left shoulder   NOSE SURGERY  2010, 2012   for nose bleeds x 2   SINOSCOPY  TONSILLECTOMY AND ADENOIDECTOMY     TOTAL HIP ARTHROPLASTY Right 05/18/2022   Procedure: RIGHT TOTAL HIP ARTHROPLASTY ANTERIOR APPROACH;  Surgeon: Kathryne Hitch, MD;  Location: [facility];  Service: Orthopedics;  Laterality: Right;   Patient Active Problem List   Diagnosis Date Noted   Status post total replacement of right hip 05/18/2022   Avascular necrosis of bone of right hip (HCC) 04/19/2022   Moderate persistent asthma 12/08/2018   Perennial allergic rhinitis with predominantly nonallergic component 12/08/2018   Allergic conjunctivitis 12/08/2018   History of epistaxis 12/08/2018   Atopic dermatitis 12/08/2018   Recurrent urticaria 12/08/2018   Skeeter syndrome 12/08/2018   Skin lesion of chest wall 07/22/2018   S/P laparoscopic cholecystectomy 03/28/2018   Dehydration 05/18/2016   Type 2 diabetes mellitus without complication, without long-term current use of insulin (HCC)  11/01/2015   History of pacemaker    POTS (postural orthostatic tachycardia syndrome)    Iron deficiency anemia 11/11/2014   Chronic colitis 08/28/2013   Malignant melanoma, metastatic (HCC) 02/09/2013   Malignant melanoma (HCC) 12/01/2012   Brain metastasis 10/16/2012   Hypertension 09/30/2012   Anxiety state, unspecified 09/30/2012   Hypoxemia 09/30/2012   Disorder of brain 09/08/2012   Dysautonomia (HCC) 02/25/2011   Anticoagulant long-term use 01/19/2011   Neuritis 01/19/2011   Skin change 01/19/2011   CVA (cerebral infarction)    Palpitation 06/28/2010   Pacemaker-Biotronik 06/28/2010   Dizziness and giddiness 04/27/2010   DIVERTICULOSIS, COLON, HX OF 01/19/2009   CAROTID SINUS SYNDROME 11/17/2008   OTHER MALAISE AND FATIGUE 11/17/2008   HYPERTENSION, BENIGN 11/02/2008   ATRIAL TACHYCARDIA 09/16/2008   SYNCOPE, HX OF 08/09/2008   INJURY UNSPEC NERVE SHOULDER GIRDLE&UPPER LIMB 07/12/2008   PERSONAL HISTORY OF MALIGNANT MELANOMA OF SKIN 07/12/2008   LUQ PAIN 12/08/2007   NECK PAIN 09/26/2007   NUMBNESS, ARM 09/26/2007   CPK, ABNORMAL 09/26/2007   ARM PAIN, LEFT 06/25/2007   SWELLING OF LIMB 06/25/2007   OSTEOPENIA 06/25/2007    PCP: Berniece Andreas, MD  REFERRING PROVIDER: Doneen Poisson, MD  REFERRING DIAG:  M79.89 (ICD-10-CM) - Swelling of limb C43.9 (ICD-10-CM) - Malignant melanoma, metastatic (HCC) Z96.641 (ICD-10-CM) - Status post total replacement of right hip    THERAPY DIAG:  Lymphedema, not elsewhere classified  Difficulty in walking, not elsewhere classified  Malignant melanoma, unspecified site [facility])  ONSET DATE: 05/18/22  Rationale for Evaluation and Treatment: Rehabilitation  SUBJECTIVE:  SUBJECTIVE STATEMENT: I had cramps in the bottom of my foot that were  really bad with the bandages but it eventually went away. I did not bandage today because I had to take a shower and I did not bring my boot because I did not know we had to still bandage.   PERTINENT HISTORY:  Hx of metastatic melanoma beginning in 2009 with lesion removed from RUE and ALND, 2014 metastasized to brain and underwent surgery and radiation, tumor removed from R groin in  2016 or 2017 (pt unable to recall) and 2 nodes removed, R THA 05/18/22, PMH includes but is not limited to: anxiety, asthma, DM-II, HTN, CVA, DVT, POTS, pacemaker, malignant melanoma with brain, liver and lung metastasis s/p radiation and L parietal craniotomy, osteopenia, diverticulitis, GERD, and Raynaud's disease.  PAIN:  Are you having pain? Yes NPRS scale: 2/10 Pain location: R thigh/hip Pain orientation: Right  PAIN TYPE: aching and heavy Pain description: constant  Aggravating factors: trying to sleep at night after a lot of activity Relieving factors: nothing  PRECAUTIONS: Anterior hip and ICD/Pacemaker  WEIGHT BEARING RESTRICTIONS: No  FALLS:  Has patient fallen in last 6 months? 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  LEISURE: walks 3-4 miles several times a week, does all the hip strengthening exercises from rehab  HAND DOMINANCE: right   PRIOR LEVEL OF FUNCTION: Independent except when getting something off the floor  PATIENT GOALS: to go back to normal, decrease swelling and pain   OBJECTIVE:  COGNITION: Overall cognitive status: Within functional limits for tasks assessed   PALPATION: Fibrosis near lateral hip in area of edema  OBSERVATIONS / OTHER ASSESSMENTS: R LE visibly more full from knee to groin compared to L LE   UPPER EXTREMITY AROM/PROM: [facility]    LYMPHEDEMA  ASSESSMENTS:   SURGERY TYPE/DATE: numerous surgeries, R ALND in 2009, surgery to remove R groin tumor in 2016 or 2017  NUMBER OF LYMPH NODES REMOVED: ALND in 2009, 2 nodes in 2016/2017 from R groin  RADIATION:completed to brain   LYMPHEDEMA ASSESSMENTS:   LANDMARK RIGHT  eval  10 cm proximal to olecranon process 24.6  Olecranon process 24  10 cm proximal to ulnar styloid process 20.7  Just proximal to ulnar styloid process 14.7  Across hand at thumb web space 17.7  At base of 2nd digit 5.7  (Blank rows = not tested)  LANDMARK LEFT  eval  10 cm proximal to olecranon process 24.7  Olecranon process 24  10 cm proximal to ulnar styloid process 19.9  Just proximal to ulnar styloid process 14.5  Across hand at thumb web space 17.8  At base of 2nd digit 5.9  (Blank rows = not tested)   LOWER EXTREMITY LANDMARK RIGHT eval RIGHT 09/05/22  At groin    30 cm proximal to suprapatella 59.4 58  20 cm proximal to suprapatella 56 55.5  10 cm proximal to suprapatella 45.5 45.2  At midpatella / popliteal crease 38 38  30 cm proximal to floor at lateral plantar foot 40.4 39.7  20 cm proximal to floor at lateral plantar foot 31.2 31.1  10 cm proximal to floor at lateral plantar foot 21.7 21.6  Circumference of ankle/heel    5 cm proximal to 1st MTP joint 22.1 22.4  Across MTP  joint 23.5 21.8  Around proximal great toe 7.9 7.3  (Blank rows = not tested)  LOWER EXTREMITY LANDMARK LEFT eval  At groin   30 cm proximal to suprapatella 56  20 cm proximal to suprapatella 52.3  10 cm proximal to suprapatella 44.9  At midpatella / popliteal crease 38.4  30 cm proximal to floor at lateral plantar foot 38.5  20 cm proximal to floor at lateral plantar foot 30  10 cm proximal to floor at lateral plantar foot 21.5  Circumference of ankle/heel   5 cm proximal to 1st MTP joint 21.9  Across MTP joint 22.3  Around proximal great toe 7  (Blank rows = not tested)  LLIS: 72  TODAY'S  TREATMENT:                                                                                                                                         DATE:  09/05/22: Pt arrived with bandages off due to taking a shower and her husband was not home to reapply She reports she is fearful of falling with the bandages and did not bring her post op shoe to wear after today's appt so she requested to hold bandaging and her husband will apply them tonight Measured UE circumferences for baseline since pt is at risk for R UE lymphedema Remeasured R LE circumferences and she demonstrates good reduction at foot at groin Measured pt for flat knit compression thigh high for long term day time management - ExoStrong Medium Avg Thigh high with silicone border Discussed how to manage lymphedema long term, answered pt's questions and discussed need for compression during waking hours because the swelling will return  09/03/22: Began instructing pt and spouse in compression bandaging technique to RLE as follows and pt's spouse took a video on his phone to practice at home: TG soft small from foot to knee, TG soft medium from knee the thigh, Elastomull to digits 1-4, artiflex from foot to knee and then from knee to thigh, 1 6 cm bandage at foot, 1 8 cm heel/ankle/sole, 1 10 cm from ankle to knee, 1 12 cm bandage from mid lower leg to above knee with "x" at knee, 1 12 cm bandage from knee to groin in herringbone, 1 12 cm bandage from above knee to groin. Educated pt to wear her spanx compression shorts on top to provide compression to her hip where she has increased swelling. Put shoe covers on R foot and she is going to get a post op shoe at [facility] after this appointment. Educated pt in walking with cane with bandages intact for balance. Practiced ascending/descending stairs with cane and bandages intact to ensure pt felt safe. She did well with all. Will have pt's spouse return demo bandaging at next session.   08/31/22:  Educated pt and spouse on anatomy and physiology of the lymphatic system and what to  expect with CDT, educated to purchase post op shoe to wear with bandaging, educated to decrease exercise some due to pain in evening after walking 3-4 miles as a result of edema  PATIENT EDUCATION:  Education details: compression bandaging technique, remove bandages if foot turns blue or becomes numb and does not improve with movement, encouraged to move and exercise with bandages intact Person educated: Patient and Spouse Education method: Explanation Education comprehension: verbalized understanding  HOME EXERCISE PROGRAM: Wear bike shorts for hip swelling Wear compression leggings when exercising  ASSESSMENT:  CLINICAL IMPRESSION: Pt's spouse was unable to come today so was unable to assess his independence with bandaging. Remeasured pt as she felt she had good reduction. Her measurements were close enough to the LLE to measure her for a flat knit thigh high compression garment. She has been fearful of falling with bandages and requested to not apply them today since she forgot her post op shoes. Educated pt to just don bandages at night since she is fearful of falling and still recovering from R hip replacement surgery. She will purchase flat knit thigh high compression stocking online per today's measurements. She would also benefit from a compression pump for long term management to help with R LE lymphedema and to help manage swelling at her R hip post surgery to relieve pain and discomfort.   OBJECTIVE IMPAIRMENTS: decreased knowledge of condition, decreased knowledge of use of DME, increased edema, and pain.   ACTIVITY LIMITATIONS: squatting and sleeping  PARTICIPATION LIMITATIONS: community activity and exercise  PERSONAL FACTORS: Past/current experiences, Time since onset of injury/illness/exacerbation, and 1 comorbidity: history of R ALND  are also affecting patient's functional outcome.   REHAB  POTENTIAL: Good  CLINICAL DECISION MAKING: Stable/uncomplicated  EVALUATION COMPLEXITY: Low  GOALS: Goals reviewed with patient? Yes  SHORT TERM GOALS: Target date: 09/28/22  Pt and/or spouse will be independent with compression bandaging for long term management of lymphedema.  Baseline: Goal status: INITIAL  2.  Pt and/or spouse will be independent in self MLD for long term management of lymphedema.  Baseline:  Goal status: INITIAL  3.  Pt will demonstrate a 1 cm decrease in edema at 30 cm superior to patella to decrease risk of infection.  Baseline:  Goal status: INITIAL    LONG TERM GOALS: Target date: 10/12/22  Pt will obtain appropriate compression garments for RLE lymphedema and for prophylactic use on RUE.  Baseline:  Goal status: INITIAL  2.  Pt will be able to sleep through the night after exercising without increase LE discomfort from lymphedema.  Baseline:  Goal status: INITIAL   PLAN:  PT FREQUENCY: 3x/week  PT DURATION: 6 weeks  PLANNED INTERVENTIONS: Therapeutic exercises, Therapeutic activity, Patient/Family education, Self Care, Joint mobilization, Orthotic/Fit training, Manual lymph drainage, Compression bandaging, scar mobilization, Taping, Vasopneumatic device, Manual therapy, and Re-evaluation  PLAN FOR NEXT SESSION: did she receive her garment? decrease to 1-2x/wk once pt and or spouse is indep with MLD and bandaging, begin CDT on RLE ++++ has hx of R ALND, only use interinguinal pathway++++ instruct in bandaging to begin with and then MLD, eventually measure for prophylactic R UE sleeve and then R LE garment    [employer], PT 09/05/2022, 12:56 PM

## 2022-09-07 ENCOUNTER — Other Ambulatory Visit: Payer: Self-pay | Admitting: Internal Medicine

## 2022-09-07 ENCOUNTER — Telehealth: Payer: Self-pay | Admitting: Internal Medicine

## 2022-09-07 ENCOUNTER — Other Ambulatory Visit: Payer: Self-pay | Admitting: Family

## 2022-09-07 NOTE — Telephone Encounter (Signed)
Prescription Request  09/07/2022  LOV: 01/17/2022  What is the name of the medication or equipment?  clonazePAM (KLONOPIN) 1 MG tablet  Pt states she only have 3 pills left and she is a cancer pt so she would need this fill as soon as possible.   Have you contacted your pharmacy to request a refill? Yes   Which pharmacy would you like this sent to?  [facility] [id number] - [place], [place] - [address] [place] [address] Phone: [phone] Fax: [phone]   Patient notified that their request is being sent to the clinical staff for review and that they should receive a response within 3 business days.   Please advise at Mobile [phone] (mobile)

## 2022-09-10 ENCOUNTER — Ambulatory Visit: Payer: Medicare Other | Admitting: Physical Therapy

## 2022-09-10 ENCOUNTER — Encounter: Payer: Self-pay | Admitting: Physical Therapy

## 2022-09-10 DIAGNOSIS — I890 Lymphedema, not elsewhere classified: Secondary | ICD-10-CM | POA: Diagnosis not present

## 2022-09-10 DIAGNOSIS — C439 Malignant melanoma of skin, unspecified: Secondary | ICD-10-CM

## 2022-09-10 DIAGNOSIS — R262 Difficulty in walking, not elsewhere classified: Secondary | ICD-10-CM | POA: Diagnosis not present

## 2022-09-10 DIAGNOSIS — Z96641 Presence of right artificial hip joint: Secondary | ICD-10-CM | POA: Diagnosis not present

## 2022-09-10 DIAGNOSIS — M7989 Other specified soft tissue disorders: Secondary | ICD-10-CM | POA: Diagnosis not present

## 2022-09-10 NOTE — Patient Instructions (Signed)
Deep Effective Breath   Standing, sitting, or laying down place both hands on the belly. Take a deep breath IN, expanding the belly; then breath OUT, contracting the belly. Repeat __5__ times. Do __2-3__ sessions per day and before each self massage.   Do 5 circles in L groin and stretch skin across a pathway from R groin towards L groin.     Copyright  VHI. All rights reserved.  LEG: Knee to Hip - Clear   Pump up outer thigh of involved leg from knee to outer hip. Then do stationary circles from inner to outer thigh, then do outer thigh again. Next, interlace fingers behind knee IF ABLE and make in-place circles. Do _5_ times of each sequence.  Do _1__ time per day.  Copyright  VHI. All rights reserved.  LEG: Ankle to Hip Sweep   Hands on sides of ankle of involved leg, pump _5__ times up both sides of lower leg, then retrace steps up outer thigh to hip as before and back to pathway. Do _2-3_ times. Do __1_ time per day.  Copyright  VHI. All rights reserved.  FOOT: Dorsum of Foot and Toes Massage   One hand on top of foot make _5_ stationary circles or pumps, then either on top of toes or each individual toe do _5_ pumps. Then retrace all steps pumping back up both sides of lower leg, outer thigh, and then pathway. Finish with what you started with, _5_ circles at L groin. All _2-3_ times at each sequence. Do _1__ time per day.  Copyright  VHI. All rights reserved.

## 2022-09-10 NOTE — Therapy (Signed)
OUTPATIENT PHYSICAL THERAPY ONCOLOGY TREATMENT  Patient Name: [name] MRN: [id number] DOB:1955-06-29, 67 y.o., female Today's Date: 09/10/2022  END OF SESSION:  PT End of Session - 09/10/22 1112     Visit Number 4   for cancer rehab   Number of Visits 18   for cancer rehab   Date for PT Re-Evaluation 10/12/22   for cancer rehab   PT Start Time 1111    PT Stop Time 1200    PT Time Calculation (min) 49 min    Activity Tolerance Patient tolerated treatment well    Behavior During Therapy [facility] for tasks assessed/performed               Past Medical History:  Diagnosis Date   Anemia    Angio-edema    Anxiety    Arthritis    Asthma    Atrial fibrillation (HCC)    Atrial tachycardia    Autonomic dysfunction    Complication of anesthesia    pt states wakes up with "shakes"   CVA (cerebral infarction)    2012 with dizziness and vision change felt embolic from atrial tachy   Disorder of bone and cartilage, unspecified    Diverticulosis    DM (diabetes mellitus) (HCC)    DVT (deep venous thrombosis) (HCC)    Eczema    Family history of anesthesia complication    PONV and " shaking "   Fatty liver    GERD (gastroesophageal reflux disease)    History of hiatal hernia    History of radiation therapy 10/24/2012   brain   HLD (hyperlipidemia)    HTN (hypertension)    Hypoglycemia, unspecified    Liver metastasis    Lung metastasis    Metastasis to brain (HCC)    Osteopenia    Other nonspecific abnormal serum enzyme levels    Other specified congenital anomalies of nervous system    Pacemaker    autonomic dysfunction   Pancreatitis    from therapy   Peripheral vascular disease (HCC)    POTS (postural orthostatic tachycardia syndrome)    Raynaud's disease    due to chemo   Skin cancer    Hx: of lung lesion   Past Surgical History:  Procedure Laterality Date   ABLATION SAPHENOUS VEIN W/ RFA     2002 x3   CARPAL TUNNEL RELEASE Left 2000    CHOLECYSTECTOMY N/A 03/28/2018   Procedure: LAPAROSCOPIC CHOLECYSTECTOMY WITH INTRAOPERATIVE CHOLANGIOGRAM;  Surgeon: Luretha Murphy, MD;  Location: [facility] OR;  Service: General;  Laterality: N/A;   CRANIOTOMY N/A 09/30/2012   suboccipital craniectomy   CRANIOTOMY Left 02/09/2013   Procedure: LEFT PARIETAL CRANIOTOMY with stealth;  Surgeon: Barnett Abu, MD;  Location: [facility];  Service: Neurosurgery;  Laterality: Left;  LEFT Parietal Craniotomy for tumor with stealth   DEEP AXILLARY SENTINEL NODE BIOPSY / EXCISION     due to extensive Melanoma-right arm   fatty tumor removed  2000   from chest   INSERT / REPLACE / REMOVE PACEMAKER  2012   1999, x 3   LAPAROSCOPY  09/06/2011   Procedure: LAPAROSCOPY OPERATIVE;  Surgeon: Tresa Endo A. Ernestina Penna, MD;  Location: [facility] ORS;  Service: Gynecology;  Laterality: Left;  with Left Ovarian Cystectomy    MELANOMA EXCISION     with removal of lymph nodes, left shoulder   NOSE SURGERY  2010, 2012   for nose bleeds x 2   SINOSCOPY     TONSILLECTOMY AND ADENOIDECTOMY  TOTAL HIP ARTHROPLASTY Right 05/18/2022   Procedure: RIGHT TOTAL HIP ARTHROPLASTY ANTERIOR APPROACH;  Surgeon: Kathryne Hitch, MD;  Location: [facility] ORS;  Service: Orthopedics;  Laterality: Right;   Patient Active Problem List   Diagnosis Date Noted   Status post total replacement of right hip 05/18/2022   Avascular necrosis of bone of right hip (HCC) 04/19/2022   Moderate persistent asthma 12/08/2018   Perennial allergic rhinitis with predominantly nonallergic component 12/08/2018   Allergic conjunctivitis 12/08/2018   History of epistaxis 12/08/2018   Atopic dermatitis 12/08/2018   Recurrent urticaria 12/08/2018   Skeeter syndrome 12/08/2018   Skin lesion of chest wall 07/22/2018   S/P laparoscopic cholecystectomy 03/28/2018   Dehydration 05/18/2016   Type 2 diabetes mellitus without complication, without long-term current use of insulin (HCC) 11/01/2015   History of pacemaker     POTS (postural orthostatic tachycardia syndrome)    Iron deficiency anemia 11/11/2014   Chronic colitis 08/28/2013   Malignant melanoma, metastatic (HCC) 02/09/2013   Malignant melanoma (HCC) 12/01/2012   Brain metastasis 10/16/2012   Hypertension 09/30/2012   Anxiety state, unspecified 09/30/2012   Hypoxemia 09/30/2012   Disorder of brain 09/08/2012   Dysautonomia (HCC) 02/25/2011   Anticoagulant long-term use 01/19/2011   Neuritis 01/19/2011   Skin change 01/19/2011   CVA (cerebral infarction)    Palpitation 06/28/2010   Pacemaker-Biotronik 06/28/2010   Dizziness and giddiness 04/27/2010   DIVERTICULOSIS, COLON, HX OF 01/19/2009   CAROTID SINUS SYNDROME 11/17/2008   OTHER MALAISE AND FATIGUE 11/17/2008   HYPERTENSION, BENIGN 11/02/2008   ATRIAL TACHYCARDIA 09/16/2008   SYNCOPE, HX OF 08/09/2008   INJURY UNSPEC NERVE SHOULDER GIRDLE&UPPER LIMB 07/12/2008   PERSONAL HISTORY OF MALIGNANT MELANOMA OF SKIN 07/12/2008   LUQ PAIN 12/08/2007   NECK PAIN 09/26/2007   NUMBNESS, ARM 09/26/2007   CPK, ABNORMAL 09/26/2007   ARM PAIN, LEFT 06/25/2007   SWELLING OF LIMB 06/25/2007   OSTEOPENIA 06/25/2007    PCP: Berniece Andreas, MD  REFERRING PROVIDER: Doneen Poisson, MD  REFERRING DIAG:  M79.89 (ICD-10-CM) - Swelling of limb C43.9 (ICD-10-CM) - Malignant melanoma, metastatic (HCC) Z96.641 (ICD-10-CM) - Status post total replacement of right hip    THERAPY DIAG:  Lymphedema, not elsewhere classified  Difficulty in walking, not elsewhere classified  Malignant melanoma, unspecified site [facility])  ONSET DATE: 05/18/22  Rationale for Evaluation and Treatment: Rehabilitation  SUBJECTIVE:                                                                                                                                                                                           SUBJECTIVE STATEMENT:I only wore bandages  for 24 hours after I saw you last. I ordered the stockings.    PERTINENT HISTORY:  Hx of metastatic melanoma beginning in 2009 with lesion removed from RUE and ALND, 2014 metastasized to brain and underwent surgery and radiation, tumor removed from R groin in  2016 or 2017 (pt unable to recall) and 2 nodes removed, R THA 05/18/22, PMH includes but is not limited to: anxiety, asthma, DM-II, HTN, CVA, DVT, POTS, pacemaker, malignant melanoma with brain, liver and lung metastasis s/p radiation and L parietal craniotomy, osteopenia, diverticulitis, GERD, and Raynaud's disease.  PAIN:  Are you having pain? Yes NPRS scale: 2/10 Pain location: R thigh/hip Pain orientation: Right  PAIN TYPE: aching and heavy Pain description: constant  Aggravating factors: trying to sleep at night after a lot of activity Relieving factors: nothing  PRECAUTIONS: Anterior hip and ICD/Pacemaker  WEIGHT BEARING RESTRICTIONS: No  FALLS:  Has patient fallen in last 6 months? 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  LEISURE: walks 3-4 miles several times a week, does all the hip strengthening exercises from rehab  HAND DOMINANCE: right   PRIOR LEVEL OF FUNCTION: Independent except when getting something off the floor  PATIENT GOALS: to go back to normal, decrease swelling and pain   OBJECTIVE:  COGNITION: Overall cognitive status: Within functional limits for tasks assessed   PALPATION: Fibrosis near lateral hip in area of edema  OBSERVATIONS / OTHER ASSESSMENTS: R LE visibly more full from knee to groin compared to L LE   UPPER EXTREMITY AROM/PROM: [facility]    LYMPHEDEMA ASSESSMENTS:   SURGERY TYPE/DATE: numerous surgeries, R ALND in 2009, surgery to remove R groin tumor in 2016 or 2017  NUMBER OF LYMPH NODES REMOVED: ALND in 2009, 2 nodes in 2016/2017  from R groin  RADIATION:completed to brain   LYMPHEDEMA ASSESSMENTS:   LANDMARK RIGHT  eval  10 cm proximal to olecranon process 24.6  Olecranon process 24  10 cm proximal to ulnar styloid process 20.7  Just proximal to ulnar styloid process 14.7  Across hand at thumb web space 17.7  At base of 2nd digit 5.7  (Blank rows = not tested)  LANDMARK LEFT  eval  10 cm proximal to olecranon process 24.7  Olecranon process 24  10 cm proximal to ulnar styloid process 19.9  Just proximal to ulnar styloid process 14.5  Across hand at thumb web space 17.8  At base of 2nd digit 5.9  (Blank rows = not tested)   LOWER EXTREMITY LANDMARK RIGHT eval RIGHT 09/05/22  At groin    30 cm proximal to suprapatella 59.4 58  20 cm proximal to suprapatella 56 55.5  10 cm proximal to suprapatella 45.5 45.2  At midpatella / popliteal crease 38 38  30 cm proximal to floor at lateral plantar foot 40.4 39.7  20 cm proximal to floor at lateral plantar foot 31.2 31.1  10 cm proximal to floor at lateral plantar foot 21.7 21.6  Circumference of ankle/heel    5 cm proximal to 1st MTP joint 22.1 22.4  Across MTP joint 23.5 21.8  Around proximal great toe 7.9 7.3  (Blank rows = not tested)  LOWER EXTREMITY LANDMARK LEFT eval  At groin   30 cm proximal to suprapatella 56  20 cm proximal to suprapatella 52.3  10 cm proximal to suprapatella 44.9  At midpatella / popliteal crease 38.4  30 cm proximal to floor at lateral plantar foot 38.5  20 cm proximal to floor at lateral plantar foot 30  10 cm proximal to floor at lateral plantar foot 21.5  Circumference of ankle/heel   5 cm proximal to 1st MTP joint 21.9  Across MTP joint 22.3  Around proximal great toe 7  (Blank rows = not tested)  LLIS: 72  TODAY'S TREATMENT:                                                                                                                                         DATE:  09/10/22:  Pt arrived with bandages off  due to fear of falling when by herself with bandages on Began MLD to R LE and instructed pt throughout and issued handout as follows: Manual lymph drainage in supine: short neck, 5 diaphragmatic breaths, left inguinal nodes and establishment of interinguinal pathway, right lower extremity from toes and dorsal foot to lateral hip redirecting along pathway.   09/05/22: Pt arrived with bandages off due to taking a shower and her husband was not home to reapply She reports she is fearful of falling with the bandages and did not bring her post op shoe to wear after today's appt so she requested to hold bandaging and her husband will apply them tonight Measured UE circumferences for baseline since pt is at risk for R UE lymphedema Remeasured R LE circumferences and she demonstrates good reduction at foot at groin Measured pt for flat knit compression thigh high for long term day time management - ExoStrong Medium Avg Thigh high with silicone border Discussed how to manage lymphedema long term, answered pt's questions and discussed need for compression during waking hours because the swelling will return  09/03/22: Began instructing pt and spouse in compression bandaging technique to RLE as follows and pt's spouse took a video on his phone to practice at home: TG soft small from foot to knee, TG soft medium from knee the thigh, Elastomull to digits 1-4, artiflex from foot to knee and then from knee to thigh, 1 6 cm bandage at foot, 1 8 cm heel/ankle/sole, 1 10 cm from ankle to knee, 1 12 cm bandage from mid lower leg to above knee with "x" at knee, 1 12 cm bandage from knee to groin in herringbone, 1 12 cm bandage from above knee to groin. Educated pt to wear her spanx compression shorts on top to provide compression to her hip where she has increased swelling. Put shoe covers on R foot and she is going to get a post op shoe at [facility] after this appointment. Educated pt in walking with cane with bandages  intact for balance. Practiced ascending/descending stairs with cane and bandages intact to ensure pt felt safe. She did well with all. Will have pt's  spouse return demo bandaging at next session.   08/31/22: Educated pt and spouse on anatomy and physiology of the lymphatic system and what to expect with CDT, educated to purchase post op shoe to wear with bandaging, educated to decrease exercise some due to pain in evening after walking 3-4 miles as a result of edema  PATIENT EDUCATION:  Education details: compression bandaging technique, remove bandages if foot turns blue or becomes numb and does not improve with movement, encouraged to move and exercise with bandages intact Person educated: Patient and Spouse Education method: Explanation Education comprehension: verbalized understanding  HOME EXERCISE PROGRAM: Wear bike shorts for hip swelling Wear compression leggings when exercising  ASSESSMENT:  CLINICAL IMPRESSION: Pt reports she has only bandages for 24 hours since her last appointment. She is fearful of falling with the bandages intact due to recent R hip replacement and avascular necrosis in L hip. Began instructing pt in MLD technique for RLE using interinguinal pathway due to node biopsy in R axilla. Educated pt to bring bandages when her husband can come so she can be bandaged prior to wearing her compression stockings so they will fit better and be more comfortable. Issued handout on self MLD today. She has a pump demo set up for the compression pump at the end of the month.   OBJECTIVE IMPAIRMENTS: decreased knowledge of condition, decreased knowledge of use of DME, increased edema, and pain.   ACTIVITY LIMITATIONS: squatting and sleeping  PARTICIPATION LIMITATIONS: community activity and exercise  PERSONAL FACTORS: Past/current experiences, Time since onset of injury/illness/exacerbation, and 1 comorbidity: history of R ALND  are also affecting patient's functional outcome.    REHAB POTENTIAL: Good  CLINICAL DECISION MAKING: Stable/uncomplicated  EVALUATION COMPLEXITY: Low  GOALS: Goals reviewed with patient? Yes  SHORT TERM GOALS: Target date: 09/28/22  Pt and/or spouse will be independent with compression bandaging for long term management of lymphedema.  Baseline: Goal status: INITIAL  2.  Pt and/or spouse will be independent in self MLD for long term management of lymphedema.  Baseline:  Goal status: INITIAL  3.  Pt will demonstrate a 1 cm decrease in edema at 30 cm superior to patella to decrease risk of infection.  Baseline:  Goal status: INITIAL    LONG TERM GOALS: Target date: 10/12/22  Pt will obtain appropriate compression garments for RLE lymphedema and for prophylactic use on RUE.  Baseline:  Goal status: INITIAL  2.  Pt will be able to sleep through the night after exercising without increase LE discomfort from lymphedema.  Baseline:  Goal status: INITIAL   PLAN:  PT FREQUENCY: 3x/week  PT DURATION: 6 weeks  PLANNED INTERVENTIONS: Therapeutic exercises, Therapeutic activity, Patient/Family education, Self Care, Joint mobilization, Orthotic/Fit training, Manual lymph drainage, Compression bandaging, scar mobilization, Taping, Vasopneumatic device, Manual therapy, and Re-evaluation  PLAN FOR NEXT SESSION: did she receive her garment? decrease to 1-2x/wk once pt and or spouse is indep with MLD and bandaging, begin CDT on RLE ++++ has hx of R ALND, only use interinguinal pathway++++ instruct in bandaging to begin with and then MLD, eventually measure for prophylactic R UE sleeve and then R LE garment    [employer], PT 09/10/2022, 12:53 PM

## 2022-09-12 ENCOUNTER — Ambulatory Visit: Payer: Medicare Other | Admitting: Physical Therapy

## 2022-09-12 ENCOUNTER — Encounter: Payer: Self-pay | Admitting: Physical Therapy

## 2022-09-12 DIAGNOSIS — I890 Lymphedema, not elsewhere classified: Secondary | ICD-10-CM

## 2022-09-12 DIAGNOSIS — R262 Difficulty in walking, not elsewhere classified: Secondary | ICD-10-CM | POA: Diagnosis not present

## 2022-09-12 DIAGNOSIS — C439 Malignant melanoma of skin, unspecified: Secondary | ICD-10-CM

## 2022-09-12 DIAGNOSIS — Z96641 Presence of right artificial hip joint: Secondary | ICD-10-CM | POA: Diagnosis not present

## 2022-09-12 DIAGNOSIS — M7989 Other specified soft tissue disorders: Secondary | ICD-10-CM | POA: Diagnosis not present

## 2022-09-12 NOTE — Therapy (Signed)
OUTPATIENT PHYSICAL THERAPY ONCOLOGY TREATMENT  Patient Name: [name] MRN: [id number] DOB:August 09, 1955, 67 y.o., female Today's Date: 09/12/2022  END OF SESSION:  PT End of Session - 09/12/22 1404     Visit Number 5   for cancer rehab   Number of Visits 18   for cancer rehab   Date for PT Re-Evaluation 10/12/22   for cancer rehab   PT Start Time 1402    PT Stop Time 1456    PT Time Calculation (min) 54 min    Activity Tolerance Patient tolerated treatment well    Behavior During Therapy [facility] for tasks assessed/performed                Past Medical History:  Diagnosis Date   Anemia    Angio-edema    Anxiety    Arthritis    Asthma    Atrial fibrillation (HCC)    Atrial tachycardia    Autonomic dysfunction    Complication of anesthesia    pt states wakes up with "shakes"   CVA (cerebral infarction)    2012 with dizziness and vision change felt embolic from atrial tachy   Disorder of bone and cartilage, unspecified    Diverticulosis    DM (diabetes mellitus) (HCC)    DVT (deep venous thrombosis) (HCC)    Eczema    Family history of anesthesia complication    PONV and " shaking "   Fatty liver    GERD (gastroesophageal reflux disease)    History of hiatal hernia    History of radiation therapy 10/24/2012   brain   HLD (hyperlipidemia)    HTN (hypertension)    Hypoglycemia, unspecified    Liver metastasis    Lung metastasis    Metastasis to brain (HCC)    Osteopenia    Other nonspecific abnormal serum enzyme levels    Other specified congenital anomalies of nervous system    Pacemaker    autonomic dysfunction   Pancreatitis    from therapy   Peripheral vascular disease (HCC)    POTS (postural orthostatic tachycardia syndrome)    Raynaud's disease    due to chemo   Skin cancer    Hx: of lung lesion   Past Surgical History:  Procedure Laterality Date   ABLATION SAPHENOUS VEIN W/ RFA     2002 x3   CARPAL TUNNEL RELEASE Left 2000    CHOLECYSTECTOMY N/A 03/28/2018   Procedure: LAPAROSCOPIC CHOLECYSTECTOMY WITH INTRAOPERATIVE CHOLANGIOGRAM;  Surgeon: Luretha Murphy, MD;  Location: [facility] OR;  Service: General;  Laterality: N/A;   CRANIOTOMY N/A 09/30/2012   suboccipital craniectomy   CRANIOTOMY Left 02/09/2013   Procedure: LEFT PARIETAL CRANIOTOMY with stealth;  Surgeon: Barnett Abu, MD;  Location: [facility] OR[facility];  Service: Neurosurgery;  Laterality: Left;  LEFT Parietal Craniotomy for tumor with stealth   DEEP AXILLARY SENTINEL NODE BIOPSY / EXCISION     due to extensive Melanoma-right arm   fatty tumor removed  2000   from chest   INSERT / REPLACE / REMOVE PACEMAKER  2012   1999, x 3   LAPAROSCOPY  09/06/2011   Procedure: LAPAROSCOPY OPERATIVE;  Surgeon: Tresa Endo A. Ernestina Penna, MD;  Location: [facility] ORS;  Service: Gynecology;  Laterality: Left;  with Left Ovarian Cystectomy    MELANOMA EXCISION     with removal of lymph nodes, left shoulder   NOSE SURGERY  2010, 2012   for nose bleeds x 2   SINOSCOPY     TONSILLECTOMY AND ADENOIDECTOMY  TOTAL HIP ARTHROPLASTY Right 05/18/2022   Procedure: RIGHT TOTAL HIP ARTHROPLASTY ANTERIOR APPROACH;  Surgeon: Kathryne Hitch, MD;  Location: [facility] ORS;  Service: Orthopedics;  Laterality: Right;   Patient Active Problem List   Diagnosis Date Noted   Status post total replacement of right hip 05/18/2022   Avascular necrosis of bone of right hip (HCC) 04/19/2022   Moderate persistent asthma 12/08/2018   Perennial allergic rhinitis with predominantly nonallergic component 12/08/2018   Allergic conjunctivitis 12/08/2018   History of epistaxis 12/08/2018   Atopic dermatitis 12/08/2018   Recurrent urticaria 12/08/2018   Skeeter syndrome 12/08/2018   Skin lesion of chest wall 07/22/2018   S/P laparoscopic cholecystectomy 03/28/2018   Dehydration 05/18/2016   Type 2 diabetes mellitus without complication, without long-term current use of insulin (HCC) 11/01/2015   History of pacemaker     POTS (postural orthostatic tachycardia syndrome)    Iron deficiency anemia 11/11/2014   Chronic colitis 08/28/2013   Malignant melanoma, metastatic (HCC) 02/09/2013   Malignant melanoma (HCC) 12/01/2012   Brain metastasis 10/16/2012   Hypertension 09/30/2012   Anxiety state, unspecified 09/30/2012   Hypoxemia 09/30/2012   Disorder of brain 09/08/2012   Dysautonomia (HCC) 02/25/2011   Anticoagulant long-term use 01/19/2011   Neuritis 01/19/2011   Skin change 01/19/2011   CVA (cerebral infarction)    Palpitation 06/28/2010   Pacemaker-Biotronik 06/28/2010   Dizziness and giddiness 04/27/2010   DIVERTICULOSIS, COLON, HX OF 01/19/2009   CAROTID SINUS SYNDROME 11/17/2008   OTHER MALAISE AND FATIGUE 11/17/2008   HYPERTENSION, BENIGN 11/02/2008   ATRIAL TACHYCARDIA 09/16/2008   SYNCOPE, HX OF 08/09/2008   INJURY UNSPEC NERVE SHOULDER GIRDLE&UPPER LIMB 07/12/2008   PERSONAL HISTORY OF MALIGNANT MELANOMA OF SKIN 07/12/2008   LUQ PAIN 12/08/2007   NECK PAIN 09/26/2007   NUMBNESS, ARM 09/26/2007   CPK, ABNORMAL 09/26/2007   ARM PAIN, LEFT 06/25/2007   SWELLING OF LIMB 06/25/2007   OSTEOPENIA 06/25/2007    PCP: Berniece Andreas, MD  REFERRING PROVIDER: Doneen Poisson, MD  REFERRING DIAG:  M79.89 (ICD-10-CM) - Swelling of limb C43.9 (ICD-10-CM) - Malignant melanoma, metastatic (HCC) Z96.641 (ICD-10-CM) - Status post total replacement of right hip    THERAPY DIAG:  Lymphedema, not elsewhere classified  Difficulty in walking, not elsewhere classified  Malignant melanoma, unspecified site [facility])  ONSET DATE: 05/18/22  Rationale for Evaluation and Treatment: Rehabilitation  SUBJECTIVE:                                                                                                                                                                                           SUBJECTIVE STATEMENT: The stocking has  not come yet but it should be here soon.   PERTINENT HISTORY:   Hx of metastatic melanoma beginning in 2009 with lesion removed from RUE and ALND, 2014 metastasized to brain and underwent surgery and radiation, tumor removed from R groin in  2016 or 2017 (pt unable to recall) and 2 nodes removed, R THA 05/18/22, PMH includes but is not limited to: anxiety, asthma, DM-II, HTN, CVA, DVT, POTS, pacemaker, malignant melanoma with brain, liver and lung metastasis s/p radiation and L parietal craniotomy, osteopenia, diverticulitis, GERD, and Raynaud's disease.  PAIN:  Are you having pain? No just heaviness in LLE  PRECAUTIONS: Anterior hip and ICD/Pacemaker  WEIGHT BEARING RESTRICTIONS: No  FALLS:  Has patient fallen in last 6 months? 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  LEISURE: walks 3-4 miles several times a week, does all the hip strengthening exercises from rehab  HAND DOMINANCE: right   PRIOR LEVEL OF FUNCTION: Independent except when getting something off the floor  PATIENT GOALS: to go back to normal, decrease swelling and pain   OBJECTIVE:  COGNITION: Overall cognitive status: Within functional limits for tasks assessed   PALPATION: Fibrosis near lateral hip in area of edema  OBSERVATIONS / OTHER ASSESSMENTS: R LE visibly more full from knee to groin compared to L LE   UPPER EXTREMITY AROM/PROM: [facility]    LYMPHEDEMA ASSESSMENTS:   SURGERY TYPE/DATE: numerous surgeries, R ALND in 2009, surgery to remove R groin tumor in 2016 or 2017  NUMBER OF LYMPH NODES REMOVED: ALND in 2009, 2 nodes in 2016/2017 from R groin  RADIATION:completed to brain   LYMPHEDEMA ASSESSMENTS:   LANDMARK RIGHT  eval  10 cm proximal to olecranon process 24.6  Olecranon process 24  10 cm proximal to ulnar styloid process 20.7  Just proximal to  ulnar styloid process 14.7  Across hand at thumb web space 17.7  At base of 2nd digit 5.7  (Blank rows = not tested)  LANDMARK LEFT  eval  10 cm proximal to olecranon process 24.7  Olecranon process 24  10 cm proximal to ulnar styloid process 19.9  Just proximal to ulnar styloid process 14.5  Across hand at thumb web space 17.8  At base of 2nd digit 5.9  (Blank rows = not tested)   LOWER EXTREMITY LANDMARK RIGHT eval RIGHT 09/05/22 RIGHT  09/12/22  At groin     30 cm proximal to suprapatella 59.4 58 59  20 cm proximal to suprapatella 56 55.5 57.5  10 cm proximal to suprapatella 45.5 45.2 47  At midpatella / popliteal crease 38 38 38  30 cm proximal to floor at lateral plantar foot 40.4 39.7 40.4  20 cm proximal to floor at lateral plantar foot 31.2 31.1 30.2  10 cm proximal to floor at lateral plantar foot 21.7 21.6 22  Circumference of ankle/heel     5 cm proximal to 1st MTP joint 22.1 22.4 22.5  Across MTP joint 23.5 21.8 23.1  Around proximal great toe 7.9 7.3 7.8  (Blank rows = not tested)  LOWER EXTREMITY LANDMARK LEFT eval LEFT 09/12/22  At groin    30 cm proximal to suprapatella 56 55.8  20 cm proximal to suprapatella 52.3 53  10 cm proximal to suprapatella 44.9 46  At midpatella / popliteal crease  38.4 38.1  30 cm proximal to floor at lateral plantar foot 38.5 39.5  20 cm proximal to floor at lateral plantar foot 30 30  10  cm proximal to floor at lateral plantar foot 21.5 22.2  Circumference of ankle/heel    5 cm proximal to 1st MTP joint 21.9 23  Across MTP joint 22.3 23.9  Around proximal great toe 7 7.2  (Blank rows = not tested)  LLIS: 72  TODAY'S TREATMENT:                                                                                                                                         DATE:  09/12/22: Began MLD to R LE and instructed pt throughout and issued handout as follows: Manual lymph drainage in supine: short neck, 5 diaphragmatic breaths,  right inguinal nodes INTACT pathway (see assessment), right lower extremity from toes and dorsal foot to lateral hip redirecting along pathway.  Did not perform bandaging due to fear of falling, pt has a doctor's appointment tomorrow and will be alone and due to recent onset of LLE edema with no known etiology and pt has hx of pacemaker - see assessment  09/10/22:  Pt arrived with bandages off due to fear of falling when by herself with bandages on Began MLD to R LE and instructed pt throughout and issued handout as follows: Manual lymph drainage in supine: short neck, 5 diaphragmatic breaths, left inguinal nodes and establishment of interinguinal pathway, right lower extremity from toes and dorsal foot to lateral hip redirecting along pathway.   09/05/22: Pt arrived with bandages off due to taking a shower and her husband was not home to reapply She reports she is fearful of falling with the bandages and did not bring her post op shoe to wear after today's appt so she requested to hold bandaging and her husband will apply them tonight Measured UE circumferences for baseline since pt is at risk for R UE lymphedema Remeasured R LE circumferences and she demonstrates good reduction at foot at groin Measured pt for flat knit compression thigh high for long term day time management - ExoStrong Medium Avg Thigh high with silicone border Discussed how to manage lymphedema long term, answered pt's questions and discussed need for compression during waking hours because the swelling will return  09/03/22: Began instructing pt and spouse in compression bandaging technique to RLE as follows and pt's spouse took a video on his phone to practice at home: TG soft small from foot to knee, TG soft medium from knee the thigh, Elastomull to digits 1-4, artiflex from foot to knee and then from knee to thigh, 1 6 cm bandage at foot, 1 8 cm heel/ankle/sole, 1 10 cm from ankle to knee, 1 12 cm bandage from mid lower leg to  above knee with "x" at knee, 1 12 cm bandage from knee to groin in herringbone,  1 12 cm bandage from above knee to groin. Educated pt to wear her spanx compression shorts on top to provide compression to her hip where she has increased swelling. Put shoe covers on R foot and she is going to get a post op shoe at [facility] after this appointment. Educated pt in walking with cane with bandages intact for balance. Practiced ascending/descending stairs with cane and bandages intact to ensure pt felt safe. She did well with all. Will have pt's spouse return demo bandaging at next session.   08/31/22: Educated pt and spouse on anatomy and physiology of the lymphatic system and what to expect with CDT, educated to purchase post op shoe to wear with bandaging, educated to decrease exercise some due to pain in evening after walking 3-4 miles as a result of edema  PATIENT EDUCATION:  Education details: compression bandaging technique, remove bandages if foot turns blue or becomes numb and does not improve with movement, encouraged to move and exercise with bandages intact Person educated: Patient and Spouse Education method: Explanation Education comprehension: verbalized understanding  HOME EXERCISE PROGRAM: Wear bike shorts for hip swelling Wear compression leggings when exercising  ASSESSMENT:  CLINICAL IMPRESSION: Pt reports to PT today with complaints of LLE swelling. She reports it is very uncomfortable and heavy at night especially proximally. She has difficulty sleeping. The swelling just began around Saturday. There is no known cause for the edema because she has not had any lymph nodes removed from the L groin or pelvic area and she has not had radiation to that area. Encouraged pt to follow up with her PCP and oncologist to rule out any disease progression especially with new onset of swelling and also to rule out any cardiac issues that could contribute to swelling. Did not bandage today due to  fall risk and recent onset of L LE. Used intact sequence today since pt has edema in LLE.   OBJECTIVE IMPAIRMENTS: decreased knowledge of condition, decreased knowledge of use of DME, increased edema, and pain.   ACTIVITY LIMITATIONS: squatting and sleeping  PARTICIPATION LIMITATIONS: community activity and exercise  PERSONAL FACTORS: Past/current experiences, Time since onset of injury/illness/exacerbation, and 1 comorbidity: history of R ALND  are also affecting patient's functional outcome.   REHAB POTENTIAL: Good  CLINICAL DECISION MAKING: Stable/uncomplicated  EVALUATION COMPLEXITY: Low  GOALS: Goals reviewed with patient? Yes  SHORT TERM GOALS: Target date: 09/28/22  Pt and/or spouse will be independent with compression bandaging for long term management of lymphedema.  Baseline: Goal status: INITIAL  2.  Pt and/or spouse will be independent in self MLD for long term management of lymphedema.  Baseline:  Goal status: INITIAL  3.  Pt will demonstrate a 1 cm decrease in edema at 30 cm superior to patella to decrease risk of infection.  Baseline:  Goal status: INITIAL    LONG TERM GOALS: Target date: 10/12/22  Pt will obtain appropriate compression garments for RLE lymphedema and for prophylactic use on RUE.  Baseline:  Goal status: INITIAL  2.  Pt will be able to sleep through the night after exercising without increase LE discomfort from lymphedema.  Baseline:  Goal status: INITIAL   PLAN:  PT FREQUENCY: 3x/week  PT DURATION: 6 weeks  PLANNED INTERVENTIONS: Therapeutic exercises, Therapeutic activity, Patient/Family education, Self Care, Joint mobilization, Orthotic/Fit training, Manual lymph drainage, Compression bandaging, scar mobilization, Taping, Vasopneumatic device, Manual therapy, and Re-evaluation  PLAN FOR NEXT SESSION: did she find out why she was having swelling  in LLE? did she receive her garment? decrease to 1-2x/wk once pt and or spouse is indep  with MLD and bandaging, begin CDT on RLE ++++ has hx of R ALND, only use interinguinal pathway++++ instruct in bandaging to begin with and then MLD, eventually measure for prophylactic R UE sleeve and then R LE garment    [employer], PT 09/12/2022, 3:58 PM

## 2022-09-12 NOTE — Progress Notes (Signed)
Chief Complaint  Patient presents with   Follow-up    Pt reports she had R hip surgery on 05/18/2022. Recovering well. R leg started swollen about couple months ago. Seeing cancer center that treat lymphatic.    Edema    On both legs.     HPI: [name] 67 y.o. come in for   new leg edema and pain left oever past few weeks.  Had  THA March went well  and swelling right after rx with wraps for lymphedema  but now left leg swelling without  cp soc sx  but pain at night lateral thigh down  to calf no redness  some  dec in am but persistant . Rehab advised evaluation.    ROS: See pertinent positives and negatives per HPI.  Past Medical History:  Diagnosis Date   Anemia    Angio-edema    Anxiety    Arthritis    Asthma    Atrial fibrillation (HCC)    Atrial tachycardia    Autonomic dysfunction    Complication of anesthesia    pt states wakes up with "shakes"   CVA (cerebral infarction)    2012 with dizziness and vision change felt embolic from atrial tachy   Disorder of bone and cartilage, unspecified    Diverticulosis    DM (diabetes mellitus) (HCC)    DVT (deep venous thrombosis) (HCC)    Eczema    Family history of anesthesia complication    PONV and " shaking "   Fatty liver    GERD (gastroesophageal reflux disease)    History of hiatal hernia    History of radiation therapy 10/24/2012   brain   HLD (hyperlipidemia)    HTN (hypertension)    Hypoglycemia, unspecified    Liver metastasis    Lung metastasis    Metastasis to brain (HCC)    Osteopenia    Other nonspecific abnormal serum enzyme levels    Other specified congenital anomalies of nervous system    Pacemaker    autonomic dysfunction   Pancreatitis    from therapy   Peripheral vascular disease (HCC)    POTS (postural orthostatic tachycardia syndrome)    Raynaud's disease    due to chemo   Skin cancer    Hx: of lung lesion    Family History  Problem Relation Age of Onset   Allergic rhinitis  Sister    Stroke Mother    Colon polyps Mother    Colon cancer Mother    Urticaria Mother    Allergic rhinitis Mother    Heart disease Father    Diabetes Father    Kidney disease Father    Diabetes Maternal Grandmother    Clotting disorder Maternal Grandmother        stroke   Crohn's disease Maternal Grandmother    Diabetes Paternal Grandmother    Aneurysm Sister        brain    Social History   Socioeconomic History   Marital status: Married    Spouse name: Not on file   Number of children: 0   Years of education: Not on file   Highest education level: Not on file  Occupational History   Occupation: PRESIDENT    Employer: [employer]    Comment: self employed  Tobacco Use   Smoking status: Never   Smokeless tobacco: Never  Vaping Use   Vaping status: Never Used  Substance and Sexual Activity   Alcohol use:  Never    Comment: glass of wine daily   Drug use: No   Sexual activity: Not on file  Other Topics Concern   Not on file  Social History Narrative   4 years college hh of 2    Neg tad   Social Determinants of Health   Financial Resource Strain: Low Risk  (11/22/2021)   Overall Financial Resource Strain (CARDIA)    Difficulty of Paying Living Expenses: Not very hard  Food Insecurity: No Food Insecurity (06/08/2022)   Hunger Vital Sign    Worried About Running Out of Food in the Last Year: Never true    Ran Out of Food in the Last Year: Never true  Transportation Needs: No Transportation Needs (05/18/2022)   PRAPARE - Administrator, Civil Service (Medical): No    Lack of Transportation (Non-Medical): No  Physical Activity: Sufficiently Active (03/14/2021)   Exercise Vital Sign    Days of Exercise per Week: 4 days    Minutes of Exercise per Session: 90 min  Stress: No Stress Concern Present (03/14/2021)   [employer] of Occupational Health - Occupational Stress Questionnaire    Feeling of Stress : Not at all  Recent Concern:  Stress - Stress Concern Present (12/26/2020)   [employer] of Occupational Health - Occupational Stress Questionnaire    Feeling of Stress : To some extent  Social Connections: Socially Integrated (03/14/2021)   Social Connection and Isolation Panel [NHANES]    Frequency of Communication with Friends and Family: More than three times a week    Frequency of Social Gatherings with Friends and Family: More than three times a week    Attends Religious Services: More than 4 times per year    Active Member of Clubs or Organizations: Yes    Attends Engineer, structural: More than 4 times per year    Marital Status: Married    Outpatient Medications Prior to Visit  Medication Sig Dispense Refill   acetaminophen (TYLENOL) 500 MG tablet Take 500 mg by mouth See admin instructions. Take 500 mg by mouth at bedtime and an additional 500 mg three times a day as needed for pain     albuterol (PROVENTIL) (2.5 MG/3ML) 0.083% nebulizer solution Take 3 mLs (2.5 mg total) by nebulization every 4 (four) hours as needed for wheezing or shortness of breath. 180 mL 1   albuterol (VENTOLIN HFA) 108 (90 Base) MCG/ACT inhaler INHALE 2 PUFFS INTO THE LUNGS EVERY 4 (FOUR) HOURS AS NEEDED FOR WHEEZING OR SHORTNESS OF BREATH. 18 Inhaler 0   Ascorbic Acid (VITAMIN C) 1000 MG tablet Take 2,000 mg by mouth daily.     aspirin 81 MG chewable tablet Chew 1 tablet (81 mg total) by mouth 2 (two) times daily. 35 tablet 0   augmented betamethasone dipropionate (DIPROLENE) 0.05 % ointment Apply topically 2 (two) times daily. Limit to no longer than 2 weeks use. Not on face. (Patient taking differently: Apply 1 Application topically 2 (two) times daily as needed (hives). Limit to no longer than 2 weeks use. Not on face.) 50 g 0   budesonide (ENTOCORT EC) 3 MG 24 hr capsule Take 3 mg by mouth as needed for rash.     calcium carbonate (TUMS EX) 750 MG chewable tablet Chew 1 tablet by mouth daily.     clobetasol cream  (TEMOVATE) 0.05 % Apply 1 application topically 2 (two) times daily as needed (rash).     clonazePAM (KLONOPIN) 1 MG tablet  TAKE ONE TABLET BY MOUTH 2-3 TIMES A DAY AS NEEDED 70 tablet 0   cloNIDine (CATAPRES) 0.1 MG tablet TAKE 1 TABLET BY MOUTH AT BEDTIME. 90 tablet 2   cyanocobalamin (VITAMIN B12) 1000 MCG/ML injection INJECT 1 ML (1,000 MCG TOTAL) INTO THE MUSCLE EVERY 30 DAYS. 3 mL 4   Desoximetasone (TOPICORT) 0.25 % ointment Apply 1 application topically 2 (two) times daily. (Patient taking differently: Apply 1 application  topically 2 (two) times daily as needed (rash).) 30 g 1   diphenhydrAMINE (BENADRYL) 25 MG tablet Take 25-50 mg by mouth daily as needed for allergies.     EPINEPHrine 0.3 mg/0.3 mL IJ SOAJ injection Inject 0.3 mg into the muscle as needed for anaphylaxis.     fexofenadine (ALLEGRA) 180 MG tablet Take 180 mg by mouth daily as needed for allergies or rhinitis.     fluticasone (FLONASE) 50 MCG/ACT nasal spray Place 2 sprays into both nostrils daily. (Patient taking differently: Place 2 sprays into both nostrils daily as needed for allergies.) 16 g 0   HYDROcodone-acetaminophen (NORCO/VICODIN) 5-325 MG tablet Take 1-2 tablets by mouth every 6 (six) hours as needed for moderate pain. 30 tablet 0   hydrocortisone (ANUSOL-HC) 25 MG suppository Place 25 mg rectally as needed for hemorrhoids.     ibuprofen (ADVIL) 800 MG tablet Take 1 tablet (800 mg total) by mouth every 8 (eight) hours as needed. 60 tablet 3   labetalol (NORMODYNE) 100 MG tablet Take 0.5 tablets (50 mg total) by mouth 2 (two) times daily. 90 tablet 3   levETIRAcetam (KEPPRA) 250 MG tablet Take 250 mg by mouth See admin instructions. Take 250 mg by mouth at 8 AM, 2 PM, and 8 PM     levocetirizine (XYZAL) 5 MG tablet TAKE 1 TABLET BY MOUTH EVERY DAY AS NEEDED 30 tablet 0   lidocaine (XYLOCAINE) 2 % solution 5mL swish and swallow as needed for sore throat; take with nystatin susp 200 mL 0   methocarbamol (ROBAXIN)  500 MG tablet Take 1 tablet (500 mg total) by mouth every 6 (six) hours as needed for muscle spasms. 40 tablet 1   metoprolol tartrate (LOPRESSOR) 25 MG tablet TAKE 1 TABLET BY MOUTH TWICE A DAY 180 tablet 1   nystatin (MYCOSTATIN) 100000 UNIT/ML suspension Use as directed 5 mLs (500,000 Units total) in the mouth or throat 3 (three) times daily as needed (thrush). (Patient taking differently: Use as directed 3 mLs in the mouth or throat 3 (three) times daily as needed (thrush).) 60 mL 2   Olopatadine HCl (PATADAY) 0.2 % SOLN Use one drop in each eye once daily as needed. (Patient taking differently: Place 1 drop into both eyes daily as needed (itchy eyes).) 2.5 mL 5   ondansetron (ZOFRAN-ODT) 4 MG disintegrating tablet Take 4 mg by mouth every 8 (eight) hours as needed for nausea/vomiting.     polyethylene glycol (MIRALAX / GLYCOLAX) packet Take 17 g by mouth at bedtime.     potassium chloride SA (KLOR-CON M) 20 MEQ tablet Take 1 tablet (20 mEq total) by mouth 2 (two) times daily. (Patient taking differently: Take 20 mEq by mouth See admin instructions. Take 20 mEq by mouth at 8 AM and 2 PM) 180 tablet 2   Probiotic Product (PROBIOTIC PO) Take 2 capsules by mouth daily.     prochlorperazine (COMPAZINE) 10 MG tablet Take 1 tablet (10 mg total) by mouth every 6 (six) hours as needed for nausea or vomiting. 60 tablet 0  sodium chloride (OCEAN) 0.65 % SOLN nasal spray Place 1 spray into both nostrils 2 (two) times daily.     Spacer/Aero-Holding Chambers [facility] DIAMOND) MISC See admin instructions.     Syringe/Needle, Disp, (SYRINGE 3CC/27GX1-1/4") 27G X 1-1/4" 3 ML MISC Inject 1 mL into the muscle every 30 (thirty) days. Vit B12 12 each 0   tiZANidine (ZANAFLEX) 2 MG tablet Take 1 tablet (2 mg total) by mouth every 6 (six) hours as needed for muscle spasms. 30 tablet 0   Vitamin D, Ergocalciferol, (DRISDOL) 50000 units CAPS capsule TAKE 1 CAPSULE (50,000 UNITS TOTAL) BY MOUTH EVERY 7 (SEVEN) DAYS.  (Patient taking differently: Take 50,000 Units by mouth every Saturday.) 4 capsule 11   Zinc 50 MG TABS Take 50 mg by mouth daily.     benzonatate (TESSALON) 200 MG capsule Take 1 capsule (200 mg total) by mouth 2 (two) times daily as needed for cough. (Patient not taking: Reported on 09/13/2022) 20 capsule 0   cephALEXin (KEFLEX) 500 MG capsule Take 1 capsule (500 mg total) by mouth 2 (two) times daily. 20 capsule 0   oxyCODONE (OXY IR/ROXICODONE) 5 MG immediate release tablet Take 1-2 tablets (5-10 mg total) by mouth every 4 (four) hours as needed for moderate pain (pain score 4-6). 30 tablet 0   Facility-Administered Medications Prior to Visit  Medication Dose Route Frequency Provider Last Rate Last Admin   mupirocin ointment (BACTROBAN) 2 %   Topical Daily Kathryne Hitch, MD         EXAM:  BP (!) 110/92 (BP Location: Left Arm, Patient Position: Sitting, Cuff Size: Normal)   Pulse (!) 53   Temp 97.8 F (36.6 C) (Oral)   Ht 5\' 3"  (1.6 m)   Wt 153 lb 6.4 oz (69.6 kg)   SpO2 100%   BMI 27.17 kg/m   Body mass index is 27.17 kg/m.  GENERAL: vitals reviewed and listed above, alert, oriented, appears well hydrated and in no acute distress HEENT: atraumatic, conjunctiva  clear, no obvious abnormalities on inspection of external nose and ears NECK: no obvious masses on inspection palpation  LUNGS: clear to auscultation bilaterally, no wheezes, rales or rhonchi, good air movement Abd no masses ascited  CV: HRRR, no clubbing cyanosis  r hip incision site clear  bilateral edema lboth legs   no chords or redness  pulses intact  MS: moves all extremities without noticeable focal  abnormality PSYCH: pleasant and cooperative, no obvious depression or anxiety Lab Results  Component Value Date   WBC 11.2 (H) 05/19/2022   HGB 12.1 05/19/2022   HCT 36.6 05/19/2022   PLT 216 05/19/2022   GLUCOSE 112 (H) 05/19/2022   CHOL 220 (H) 07/07/2021   TRIG 179 (H) 07/07/2021   HDL 55  07/07/2021   LDLDIRECT 129 (H) 07/07/2021   LDLCALC 133 (H) 07/07/2021   ALT 22 05/08/2022   AST 28 05/08/2022   NA 140 05/19/2022   K 3.9 05/19/2022   CL 109 05/19/2022   CREATININE 0.58 05/19/2022   BUN 13 05/19/2022   CO2 24 05/19/2022   TSH 1.146 12/17/2013   INR 1.03 08/28/2013   HGBA1C 5.7 (H) 05/08/2022   BP Readings from Last 3 Encounters:  09/13/22 (!) 110/92  05/19/22 131/83  05/17/22 110/70    ASSESSMENT AND PLAN:  Discussed the following assessment and plan:  Left leg pain - Plan: VAS [name] LOWER EXTREMITY VENOUS (DVT)  Bilateral leg edema - Plan: VAS [name] LOWER EXTREMITY VENOUS (DVT)  History of DVT (deep vein thrombosis) - Plan: VAS [name] LOWER EXTREMITY VENOUS (DVT)  S/P hip replacement, right - Plan: VAS [name] LOWER EXTREMITY VENOUS (DVT) Concern about  dvt vs venous insufficiency no clinical  signs of chf renal at this time  Get US dvt r/o stat and then go from there .  -Patient advised to return or notify health care team  if  new concerns arise.  There are no Patient Instructions on file for this visit.   Neta Mends. Odai Wimmer M.D.

## 2022-09-13 ENCOUNTER — Ambulatory Visit (INDEPENDENT_AMBULATORY_CARE_PROVIDER_SITE_OTHER): Payer: Medicare Other | Admitting: Internal Medicine

## 2022-09-13 ENCOUNTER — Encounter: Payer: Self-pay | Admitting: Internal Medicine

## 2022-09-13 VITALS — BP 110/92 | HR 53 | Temp 97.8°F | Ht 63.0 in | Wt 153.4 lb

## 2022-09-13 DIAGNOSIS — M79605 Pain in left leg: Secondary | ICD-10-CM | POA: Diagnosis not present

## 2022-09-13 DIAGNOSIS — M8700 Idiopathic aseptic necrosis of unspecified bone: Secondary | ICD-10-CM | POA: Diagnosis not present

## 2022-09-13 DIAGNOSIS — Z96641 Presence of right artificial hip joint: Secondary | ICD-10-CM

## 2022-09-13 DIAGNOSIS — R600 Localized edema: Secondary | ICD-10-CM

## 2022-09-13 DIAGNOSIS — Z86718 Personal history of other venous thrombosis and embolism: Secondary | ICD-10-CM | POA: Diagnosis not present

## 2022-09-13 NOTE — Patient Instructions (Signed)
Ordering    state bilateral US dvt doppler rule out.  Then go from there

## 2022-09-14 ENCOUNTER — Ambulatory Visit (INDEPENDENT_AMBULATORY_CARE_PROVIDER_SITE_OTHER): Payer: Medicare Other

## 2022-09-14 ENCOUNTER — Telehealth: Payer: Self-pay | Admitting: Internal Medicine

## 2022-09-14 ENCOUNTER — Telehealth: Payer: Self-pay

## 2022-09-14 DIAGNOSIS — Z96641 Presence of right artificial hip joint: Secondary | ICD-10-CM | POA: Diagnosis not present

## 2022-09-14 DIAGNOSIS — Z86718 Personal history of other venous thrombosis and embolism: Secondary | ICD-10-CM | POA: Diagnosis not present

## 2022-09-14 DIAGNOSIS — R600 Localized edema: Secondary | ICD-10-CM | POA: Diagnosis not present

## 2022-09-14 DIAGNOSIS — M79605 Pain in left leg: Secondary | ICD-10-CM

## 2022-09-14 MED ORDER — FUROSEMIDE 20 MG PO TABS: 20.0000 mg | ORAL_TABLET | Freq: Every day | ORAL | 0 refills | Status: DC

## 2022-09-14 NOTE — Telephone Encounter (Signed)
Spoke with office rep at Dr Rosezella [place] office and advised call was routed to incorrect area as pt had Vas doppler completed at [facility].  Advised study has not yet been signed so it may not be complete and encouraged rep to contact [facility] again and ask to speak with someone in the [place] [facility].  She verbalizes understanding and thanked Rn for the callback.

## 2022-09-14 NOTE — Telephone Encounter (Signed)
Called [facility] to follow up on the STAT DVT result. Spoke to [place], Call center. She reports the result has not been review by nurse or doctor yet. And that it takes a while for it to be review even with STAT. When ask how long. She didn't say but states she send in message to the nurse.

## 2022-09-14 NOTE — Telephone Encounter (Signed)
DVT status preliminary. States : BILATERAL:  - No evidence of deep vein thrombosis seen in the lower extremities,  bilaterally.   Result was report to Dr. Fabian Sharp. She advise to tell patient of result," no dvt. Will wait for final report and address any further work up next week."  Contacted pt. Pt was made aware and still concerns on edema. Wants to know she should what do.

## 2022-09-14 NOTE — Addendum Note (Signed)
Addended byVickii Chafe on: 09/14/2022 04:14 PM   Modules accepted: Orders

## 2022-09-14 NOTE — Telephone Encounter (Signed)
Call in Lasix 20 mg daily, #5 only

## 2022-09-14 NOTE — Telephone Encounter (Signed)
Office calling to get results by the end of today for the DVT. Please advise.

## 2022-09-14 NOTE — Telephone Encounter (Signed)
From Dr. Fabian Sharp,  " For now compression both legs. We could try a few days of diuretic but have concern about inside effects. If she feels need more than compression. Ask provider if they would chart review and order 3-5 days of diuretic(provider is out of office). Caution so she doesn't get low bp since she has a hx of pots."  Spoke to pt. She states she tried compression only one leg. As other, was unable to use compression sock due to swelling. Take lasix in the past. Request a low dose.   Can you help chart review pt chart and advise on the diuretic? Thank you!

## 2022-09-17 ENCOUNTER — Encounter (HOSPITAL_COMMUNITY): Payer: Medicare Other

## 2022-09-19 NOTE — Progress Notes (Signed)
Final report no clot or obstruction ( doesn't rule out venous insufficiency ) . Please give update on how doing   after 5 days of  diuretic

## 2022-09-23 ENCOUNTER — Other Ambulatory Visit: Payer: Self-pay | Admitting: Family Medicine

## 2022-09-24 ENCOUNTER — Encounter: Payer: Self-pay | Admitting: Physical Therapy

## 2022-09-24 ENCOUNTER — Telehealth: Payer: Self-pay | Admitting: Internal Medicine

## 2022-09-24 ENCOUNTER — Ambulatory Visit: Payer: Medicare Other | Admitting: Physical Therapy

## 2022-09-24 DIAGNOSIS — R262 Difficulty in walking, not elsewhere classified: Secondary | ICD-10-CM | POA: Diagnosis not present

## 2022-09-24 DIAGNOSIS — I890 Lymphedema, not elsewhere classified: Secondary | ICD-10-CM | POA: Diagnosis not present

## 2022-09-24 DIAGNOSIS — M7989 Other specified soft tissue disorders: Secondary | ICD-10-CM | POA: Diagnosis not present

## 2022-09-24 DIAGNOSIS — C439 Malignant melanoma of skin, unspecified: Secondary | ICD-10-CM | POA: Diagnosis not present

## 2022-09-24 DIAGNOSIS — Z96641 Presence of right artificial hip joint: Secondary | ICD-10-CM | POA: Diagnosis not present

## 2022-09-24 NOTE — Telephone Encounter (Signed)
Pt is scheduled to see Francis Dowse, PA-C on 09/28/2022.

## 2022-09-24 NOTE — Therapy (Signed)
OUTPATIENT PHYSICAL THERAPY ONCOLOGY TREATMENT  Patient Name: [name] MRN: [id number] DOB:1955-07-15, 67 y.o., female Today's Date: 09/24/2022  END OF SESSION:  PT End of Session - 09/24/22 1012     Visit Number 6   for cancer rehab   Number of Visits 18   for cancer rehab   Date for PT Re-Evaluation 10/12/22   for cancer rehab   Authorization Type [employer] Medicare    PT Start Time 1011   pt arrived late   PT Stop Time 1048    PT Time Calculation (min) 37 min    Activity Tolerance Patient tolerated treatment well    Behavior During Therapy WFL for tasks assessed/performed                Past Medical History:  Diagnosis Date   Anemia    Angio-edema    Anxiety    Arthritis    Asthma    Atrial fibrillation (HCC)    Atrial tachycardia    Autonomic dysfunction    Complication of anesthesia    pt states wakes up with "shakes"   CVA (cerebral infarction)    2012 with dizziness and vision change felt embolic from atrial tachy   Disorder of bone and cartilage, unspecified    Diverticulosis    DM (diabetes mellitus) (HCC)    DVT (deep venous thrombosis) (HCC)    Eczema    Family history of anesthesia complication    PONV and " shaking "   Fatty liver    GERD (gastroesophageal reflux disease)    History of hiatal hernia    History of radiation therapy 10/24/2012   brain   HLD (hyperlipidemia)    HTN (hypertension)    Hypoglycemia, unspecified    Liver metastasis    Lung metastasis    Metastasis to brain (HCC)    Osteopenia    Other nonspecific abnormal serum enzyme levels    Other specified congenital anomalies of nervous system    Pacemaker    autonomic dysfunction   Pancreatitis    from therapy   Peripheral vascular disease (HCC)    POTS (postural orthostatic tachycardia syndrome)    Raynaud's disease    due to chemo   Skin cancer    Hx: of lung lesion   Past Surgical History:  Procedure Laterality Date   ABLATION SAPHENOUS VEIN W/ RFA     2002  x3   CARPAL TUNNEL RELEASE Left 2000   CHOLECYSTECTOMY N/A 03/28/2018   Procedure: LAPAROSCOPIC CHOLECYSTECTOMY WITH INTRAOPERATIVE CHOLANGIOGRAM;  Surgeon: Luretha Murphy, MD;  Location: [facility] OR;  Service: General;  Laterality: N/A;   CRANIOTOMY N/A 09/30/2012   suboccipital craniectomy   CRANIOTOMY Left 02/09/2013   Procedure: LEFT PARIETAL CRANIOTOMY with stealth;  Surgeon: Barnett Abu, MD;  Location: [facility];  Service: Neurosurgery;  Laterality: Left;  LEFT Parietal Craniotomy for tumor with stealth   DEEP AXILLARY SENTINEL NODE BIOPSY / EXCISION     due to extensive Melanoma-right arm   fatty tumor removed  2000   from chest   INSERT / REPLACE / REMOVE PACEMAKER  2012   1999, x 3   LAPAROSCOPY  09/06/2011   Procedure: LAPAROSCOPY OPERATIVE;  Surgeon: Tresa Endo A. Ernestina Penna, MD;  Location: [facility] ORS;  Service: Gynecology;  Laterality: Left;  with Left Ovarian Cystectomy    MELANOMA EXCISION     with removal of lymph nodes, left shoulder   NOSE SURGERY  2010, 2012   for nose bleeds x  2   SINOSCOPY     TONSILLECTOMY AND ADENOIDECTOMY     TOTAL HIP ARTHROPLASTY Right 05/18/2022   Procedure: RIGHT TOTAL HIP ARTHROPLASTY ANTERIOR APPROACH;  Surgeon: Kathryne Hitch, MD;  Location: [facility] ORS;  Service: Orthopedics;  Laterality: Right;   Patient Active Problem List   Diagnosis Date Noted   Status post total replacement of right hip 05/18/2022   Avascular necrosis of bone of right hip (HCC) 04/19/2022   Moderate persistent asthma 12/08/2018   Perennial allergic rhinitis with predominantly nonallergic component 12/08/2018   Allergic conjunctivitis 12/08/2018   History of epistaxis 12/08/2018   Atopic dermatitis 12/08/2018   Recurrent urticaria 12/08/2018   Skeeter syndrome 12/08/2018   Skin lesion of chest wall 07/22/2018   S/P laparoscopic cholecystectomy 03/28/2018   Dehydration 05/18/2016   Type 2 diabetes mellitus without complication, without long-term current use of insulin  (HCC) 11/01/2015   History of pacemaker    POTS (postural orthostatic tachycardia syndrome)    Iron deficiency anemia 11/11/2014   Chronic colitis 08/28/2013   Malignant melanoma, metastatic (HCC) 02/09/2013   Malignant melanoma (HCC) 12/01/2012   Brain metastasis 10/16/2012   Hypertension 09/30/2012   Anxiety state, unspecified 09/30/2012   Hypoxemia 09/30/2012   Disorder of brain 09/08/2012   Dysautonomia (HCC) 02/25/2011   Anticoagulant long-term use 01/19/2011   Neuritis 01/19/2011   Skin change 01/19/2011   CVA (cerebral infarction)    Palpitation 06/28/2010   Pacemaker-Biotronik 06/28/2010   Dizziness and giddiness 04/27/2010   DIVERTICULOSIS, COLON, HX OF 01/19/2009   CAROTID SINUS SYNDROME 11/17/2008   OTHER MALAISE AND FATIGUE 11/17/2008   HYPERTENSION, BENIGN 11/02/2008   ATRIAL TACHYCARDIA 09/16/2008   SYNCOPE, HX OF 08/09/2008   INJURY UNSPEC NERVE SHOULDER GIRDLE&UPPER LIMB 07/12/2008   PERSONAL HISTORY OF MALIGNANT MELANOMA OF SKIN 07/12/2008   LUQ PAIN 12/08/2007   NECK PAIN 09/26/2007   NUMBNESS, ARM 09/26/2007   CPK, ABNORMAL 09/26/2007   ARM PAIN, LEFT 06/25/2007   SWELLING OF LIMB 06/25/2007   OSTEOPENIA 06/25/2007    PCP: Berniece Andreas, MD  REFERRING PROVIDER: Doneen Poisson, MD  REFERRING DIAG:  M79.89 (ICD-10-CM) - Swelling of limb C43.9 (ICD-10-CM) - Malignant melanoma, metastatic (HCC) Z96.641 (ICD-10-CM) - Status post total replacement of right hip    THERAPY DIAG:  Lymphedema, not elsewhere classified  Difficulty in walking, not elsewhere classified  Malignant melanoma, unspecified site [facility])  ONSET DATE: 05/18/22  Rationale for Evaluation and Treatment: Rehabilitation  SUBJECTIVE:  SUBJECTIVE STATEMENT: I have swelling in both legs R and L and  some in my fingers. I went to my doctor and she put me on lasix. It made me feel terrible. The girl is coming out for the pump tomorrow.   PERTINENT HISTORY:  Hx of metastatic melanoma beginning in 2009 with lesion removed from RUE and ALND, 2014 metastasized to brain and underwent surgery and radiation, tumor removed from R groin in  2016 or 2017 (pt unable to recall) and 2 nodes removed, R THA 05/18/22, PMH includes but is not limited to: anxiety, asthma, DM-II, HTN, CVA, DVT, POTS, pacemaker, malignant melanoma with brain, liver and lung metastasis s/p radiation and L parietal craniotomy, osteopenia, diverticulitis, GERD, and Raynaud's disease.  PAIN:  Are you having pain? No just discomfort in LLE  PRECAUTIONS: Anterior hip and ICD/Pacemaker  WEIGHT BEARING RESTRICTIONS: No  FALLS:  Has patient fallen in last 6 months? 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  LEISURE: walks 3-4 miles several times a week, does all the hip strengthening exercises from rehab  HAND DOMINANCE: right   PRIOR LEVEL OF FUNCTION: Independent except when getting something off the floor  PATIENT GOALS: to go back to normal, decrease swelling and pain   OBJECTIVE:  COGNITION: Overall cognitive status: Within functional limits for tasks assessed   PALPATION: Fibrosis near lateral hip in area of edema  OBSERVATIONS / OTHER ASSESSMENTS: R LE visibly more full from knee to groin compared to L LE   UPPER EXTREMITY AROM/PROM: [facility]    LYMPHEDEMA ASSESSMENTS:   SURGERY TYPE/DATE: numerous surgeries, R ALND in 2009, surgery to remove R groin tumor in 2016 or 2017  NUMBER OF LYMPH NODES REMOVED: ALND in 2009, 2 nodes in 2016/2017 from R groin  RADIATION:completed to brain   LYMPHEDEMA  ASSESSMENTS:   LANDMARK RIGHT  eval  10 cm proximal to olecranon process 24.6  Olecranon process 24  10 cm proximal to ulnar styloid process 20.7  Just proximal to ulnar styloid process 14.7  Across hand at thumb web space 17.7  At base of 2nd digit 5.7  (Blank rows = not tested)  LANDMARK LEFT  eval  10 cm proximal to olecranon process 24.7  Olecranon process 24  10 cm proximal to ulnar styloid process 19.9  Just proximal to ulnar styloid process 14.5  Across hand at thumb web space 17.8  At base of 2nd digit 5.9  (Blank rows = not tested)   LOWER EXTREMITY LANDMARK RIGHT eval RIGHT 09/05/22 RIGHT  09/12/22  At groin     30 cm proximal to suprapatella 59.4 58 59  20 cm proximal to suprapatella 56 55.5 57.5  10 cm proximal to suprapatella 45.5 45.2 47  At midpatella / popliteal crease 38 38 38  30 cm proximal to floor at lateral plantar foot 40.4 39.7 40.4  20 cm proximal to floor at lateral plantar foot 31.2 31.1 30.2  10 cm proximal to floor at lateral plantar foot 21.7 21.6 22  Circumference of ankle/heel     5 cm proximal to 1st MTP joint 22.1 22.4 22.5  Across MTP joint 23.5 21.8 23.1  Around proximal great toe 7.9 7.3 7.8  (Blank rows = not tested)  LOWER EXTREMITY LANDMARK LEFT eval LEFT 09/12/22  At groin  30 cm proximal to suprapatella 56 55.8  20 cm proximal to suprapatella 52.3 53  10 cm proximal to suprapatella 44.9 46  At midpatella / popliteal crease 38.4 38.1  30 cm proximal to floor at lateral plantar foot 38.5 39.5  20 cm proximal to floor at lateral plantar foot 30 30  10  cm proximal to floor at lateral plantar foot 21.5 22.2  Circumference of ankle/heel    5 cm proximal to 1st MTP joint 21.9 23  Across MTP joint 22.3 23.9  Around proximal great toe 7 7.2  (Blank rows = not tested)  LLIS: 72  TODAY'S TREATMENT:                                                                                                                                          DATE:  09/24/22:  See assessment for more detail Educated pt on compression bandaging/pump and possible active cancer and why this is contraindicated Discussed onset of LLE edema and possibly cardiac causes (pt has pacemaker)  09/12/22: Began MLD to R LE and instructed pt throughout and issued handout as follows: Manual lymph drainage in supine: short neck, 5 diaphragmatic breaths, right inguinal nodes INTACT pathway (see assessment), right lower extremity from toes and dorsal foot to lateral hip redirecting along pathway.  Did not perform bandaging due to fear of falling, pt has a doctor's appointment tomorrow and will be alone and due to recent onset of LLE edema with no known etiology and pt has hx of pacemaker - see assessment  09/10/22:  Pt arrived with bandages off due to fear of falling when by herself with bandages on Began MLD to R LE and instructed pt throughout and issued handout as follows: Manual lymph drainage in supine: short neck, 5 diaphragmatic breaths, left inguinal nodes and establishment of interinguinal pathway, right lower extremity from toes and dorsal foot to lateral hip redirecting along pathway.   09/05/22: Pt arrived with bandages off due to taking a shower and her husband was not home to reapply She reports she is fearful of falling with the bandages and did not bring her post op shoe to wear after today's appt so she requested to hold bandaging and her husband will apply them tonight Measured UE circumferences for baseline since pt is at risk for R UE lymphedema Remeasured R LE circumferences and she demonstrates good reduction at foot at groin Measured pt for flat knit compression thigh high for long term day time management - ExoStrong Medium Avg Thigh high with silicone border Discussed how to manage lymphedema long term, answered pt's questions and discussed need for compression during waking hours because the swelling will return  09/03/22: Began instructing  pt and spouse in compression bandaging technique to RLE as follows and pt's spouse took a video on his phone to practice at home: TG soft small from foot to knee, TG  soft medium from knee the thigh, Elastomull to digits 1-4, artiflex from foot to knee and then from knee to thigh, 1 6 cm bandage at foot, 1 8 cm heel/ankle/sole, 1 10 cm from ankle to knee, 1 12 cm bandage from mid lower leg to above knee with "x" at knee, 1 12 cm bandage from knee to groin in herringbone, 1 12 cm bandage from above knee to groin. Educated pt to wear her spanx compression shorts on top to provide compression to her hip where she has increased swelling. Put shoe covers on R foot and she is going to get a post op shoe at [facility] after this appointment. Educated pt in walking with cane with bandages intact for balance. Practiced ascending/descending stairs with cane and bandages intact to ensure pt felt safe. She did well with all. Will have pt's spouse return demo bandaging at next session.   08/31/22: Educated pt and spouse on anatomy and physiology of the lymphatic system and what to expect with CDT, educated to purchase post op shoe to wear with bandaging, educated to decrease exercise some due to pain in evening after walking 3-4 miles as a result of edema  PATIENT EDUCATION:  Education details: compression bandaging technique, remove bandages if foot turns blue or becomes numb and does not improve with movement, encouraged to move and exercise with bandages intact Person educated: Patient and Spouse Education method: Explanation Education comprehension: verbalized understanding  HOME EXERCISE PROGRAM: Wear bike shorts for hip swelling Wear compression leggings when exercising  ASSESSMENT:  CLINICAL IMPRESSION: Pt arrived to PT today. She reports that she has developed a lump on her R hip just lateral to her hip replacement scar. She reports that when she had a cancerous lump in her groin previously in was close  to the same area and felt the same. She reports the lump has gotten larger since it first appeared. Took photo of lump and attached in media section of pt's chart. Discussed importance of seeing her oncologist to determine if the lump is recurrence. Pt also has recently been having LLE swelling. Discussed that with her history she should not have LLE lymphedema. Discussed need for clearance from cardiologist and oncologist since she has a new onset of LLE swelling. Discussed why bandaging and compression pump would be contraindicated since the lump could be recurrence. Discussed with pt need for appt with cardiology and oncology for clearance to continue with bandaging and for pump trial. Pt and spouse verbalize understanding.   OBJECTIVE IMPAIRMENTS: decreased knowledge of condition, decreased knowledge of use of DME, increased edema, and pain.   ACTIVITY LIMITATIONS: squatting and sleeping  PARTICIPATION LIMITATIONS: community activity and exercise  PERSONAL FACTORS: Past/current experiences, Time since onset of injury/illness/exacerbation, and 1 comorbidity: history of R ALND  are also affecting patient's functional outcome.   REHAB POTENTIAL: Good  CLINICAL DECISION MAKING: Stable/uncomplicated  EVALUATION COMPLEXITY: Low  GOALS: Goals reviewed with patient? Yes  SHORT TERM GOALS: Target date: 09/28/22  Pt and/or spouse will be independent with compression bandaging for long term management of lymphedema.  Baseline: Goal status: INITIAL  2.  Pt and/or spouse will be independent in self MLD for long term management of lymphedema.  Baseline:  Goal status: INITIAL  3.  Pt will demonstrate a 1 cm decrease in edema at 30 cm superior to patella to decrease risk of infection.  Baseline:  Goal status: INITIAL    LONG TERM GOALS: Target date: 10/12/22  Pt will obtain  appropriate compression garments for RLE lymphedema and for prophylactic use on RUE.  Baseline:  Goal status:  INITIAL  2.  Pt will be able to sleep through the night after exercising without increase LE discomfort from lymphedema.  Baseline:  Goal status: INITIAL   PLAN:  PT FREQUENCY: 3x/week  PT DURATION: 6 weeks  PLANNED INTERVENTIONS: Therapeutic exercises, Therapeutic activity, Patient/Family education, Self Care, Joint mobilization, Orthotic/Fit training, Manual lymph drainage, Compression bandaging, scar mobilization, Taping, Vasopneumatic device, Manual therapy, and Re-evaluation  PLAN FOR NEXT SESSION: did she find out why she was having swelling in LLE? Cleared by oncologist? did she receive her garment? decrease to 1-2x/wk once pt and or spouse is indep with MLD and bandaging, begin CDT on RLE ++++ has hx of R ALND, only use interinguinal pathway++++ instruct in bandaging to begin with and then MLD, eventually measure for prophylactic R UE sleeve and then R LE garment    [employer], PT 09/24/2022, 10:59 AM

## 2022-09-24 NOTE — Telephone Encounter (Signed)
Pt c/o swelling/edema: STAT if pt has developed SOB within 24 hours  If swelling, where is the swelling located? Legs, fingers for about 3 weeks   How much weight have you gained and in what time span? 7/18 - 153, 7/29- 157    Have you gained 2 pounds in a day or 5 pounds in a week? No   Do you have a log of your daily weights (if so, list)? Number 2 above   Are you currently taking a fluid pill? no  Are you currently SOB? No   Have you traveled recently in a car or plane for an extended period of time? No    Pt states she has been f/u with her PCP about swelling however they told to contact cardiology since swelling has not gone down. Please advise.

## 2022-09-24 NOTE — Telephone Encounter (Signed)
Spoke with pt who complains of bilateral LEE x 3 weeks.  She reports she does have some swelling in her fingers.  She has seen her PCP and also referred to lymphedema clinic.  They have suggested she see cardiology prior to continuing treatment for clearance. Pt advised will forward to Dr Odessa Fleming schedulers to assist with appointment.  Pt verbalizes understanding and thanked Charity fundraiser for the callback.

## 2022-09-25 DIAGNOSIS — I890 Lymphedema, not elsewhere classified: Secondary | ICD-10-CM | POA: Diagnosis not present

## 2022-09-25 DIAGNOSIS — C439 Malignant melanoma of skin, unspecified: Secondary | ICD-10-CM | POA: Diagnosis not present

## 2022-09-25 DIAGNOSIS — Z96641 Presence of right artificial hip joint: Secondary | ICD-10-CM | POA: Diagnosis not present

## 2022-09-25 DIAGNOSIS — R2241 Localized swelling, mass and lump, right lower limb: Secondary | ICD-10-CM | POA: Diagnosis not present

## 2022-09-26 ENCOUNTER — Other Ambulatory Visit (HOSPITAL_BASED_OUTPATIENT_CLINIC_OR_DEPARTMENT_OTHER): Payer: Self-pay

## 2022-09-26 DIAGNOSIS — M7989 Other specified soft tissue disorders: Secondary | ICD-10-CM

## 2022-09-27 NOTE — Progress Notes (Deleted)
Cardiology Office Note Date:  09/27/2022  Patient ID:  [name], DOB 26-Jan-1956, MRN [id number] PCP:  Madelin Headings, MD  Electrophysiologist: Dr. Graciela Husbands  ***refresh   Chief Complaint: *** 6 mo  History of Present Illness: [name] is a 67 y.o. female with history of dysautonomia w/PPM (for dysautonomia and bradycardia), ATach, Raynaud's  She saw Dr. Graciela Husbands 06/27/21,  "With Raynaud's, recently affirmed by some folks at [facility], would like to stop the metoprolol. Due to little bit of research and combined alpha beta blockers seem to be associated with fewer symptoms, so we will discontinue as noted and try her on labetalol 50 mg twice daily. If this does not suffice, we can try her on carvedilol and if that does not suffice we can stop it altogether she is on clonidine which is a central sympatholytic"  She saw Otilio Saber, PA-C 05/17/22, doing well, chronic lightheadedness with abrupt standing, cleared for hip surgery, no changes were made.  PMD recently described s/p LLE swelling post THA, Rx 5 days of diuretic and sent for venous US (was neg).  *** symptoms *** meds  *** edema, update echo? *** labs, lytes, ?? lasix  Device information Biotronik dual chamber PPM, implanted 04/28/1997, gen change 11/01/10 MDT RV lead Pacesetter RA lead   Past Medical History:  Diagnosis Date   Anemia    Angio-edema    Anxiety    Arthritis    Asthma    Atrial fibrillation (HCC)    Atrial tachycardia    Autonomic dysfunction    Complication of anesthesia    pt states wakes up with "shakes"   CVA (cerebral infarction)    2012 with dizziness and vision change felt embolic from atrial tachy   Disorder of bone and cartilage, unspecified    Diverticulosis    DM (diabetes mellitus) (HCC)    DVT (deep venous thrombosis) (HCC)    Eczema    Family history of anesthesia complication    PONV and " shaking "   Fatty liver    GERD (gastroesophageal reflux disease)    History of hiatal  hernia    History of radiation therapy 10/24/2012   brain   HLD (hyperlipidemia)    HTN (hypertension)    Hypoglycemia, unspecified    Liver metastasis    Lung metastasis    Metastasis to brain (HCC)    Osteopenia    Other nonspecific abnormal serum enzyme levels    Other specified congenital anomalies of nervous system    Pacemaker    autonomic dysfunction   Pancreatitis    from therapy   Peripheral vascular disease (HCC)    POTS (postural orthostatic tachycardia syndrome)    Raynaud's disease    due to chemo   Skin cancer    Hx: of lung lesion    Past Surgical History:  Procedure Laterality Date   ABLATION SAPHENOUS VEIN W/ RFA     2002 x3   CARPAL TUNNEL RELEASE Left 2000   CHOLECYSTECTOMY N/A 03/28/2018   Procedure: LAPAROSCOPIC CHOLECYSTECTOMY WITH INTRAOPERATIVE CHOLANGIOGRAM;  Surgeon: Luretha Murphy, MD;  Location: [facility] OR;  Service: General;  Laterality: N/A;   CRANIOTOMY N/A 09/30/2012   suboccipital craniectomy   CRANIOTOMY Left 02/09/2013   Procedure: LEFT PARIETAL CRANIOTOMY with stealth;  Surgeon: Barnett Abu, MD;  Location: [facility] ORS;  Service: Neurosurgery;  Laterality: Left;  LEFT Parietal Craniotomy for tumor with stealth   DEEP AXILLARY SENTINEL NODE BIOPSY / EXCISION  due to extensive Melanoma-right arm   fatty tumor removed  2000   from chest   INSERT / REPLACE / REMOVE PACEMAKER  2012   1999, x 3   LAPAROSCOPY  09/06/2011   Procedure: LAPAROSCOPY OPERATIVE;  Surgeon: Tresa Endo A. Ernestina Penna, MD;  Location: [facility] ORS;  Service: Gynecology;  Laterality: Left;  with Left Ovarian Cystectomy    MELANOMA EXCISION     with removal of lymph nodes, left shoulder   NOSE SURGERY  2010, 2012   for nose bleeds x 2   SINOSCOPY     TONSILLECTOMY AND ADENOIDECTOMY     TOTAL HIP ARTHROPLASTY Right 05/18/2022   Procedure: RIGHT TOTAL HIP ARTHROPLASTY ANTERIOR APPROACH;  Surgeon: Kathryne Hitch, MD;  Location: [facility] ORS;  Service: Orthopedics;  Laterality:  Right;    Current Outpatient Medications  Medication Sig Dispense Refill   acetaminophen (TYLENOL) 500 MG tablet Take 500 mg by mouth See admin instructions. Take 500 mg by mouth at bedtime and an additional 500 mg three times a day as needed for pain     albuterol (PROVENTIL) (2.5 MG/3ML) 0.083% nebulizer solution Take 3 mLs (2.5 mg total) by nebulization every 4 (four) hours as needed for wheezing or shortness of breath. 180 mL 1   albuterol (VENTOLIN HFA) 108 (90 Base) MCG/ACT inhaler INHALE 2 PUFFS INTO THE LUNGS EVERY 4 (FOUR) HOURS AS NEEDED FOR WHEEZING OR SHORTNESS OF BREATH. 18 Inhaler 0   Ascorbic Acid (VITAMIN C) 1000 MG tablet Take 2,000 mg by mouth daily.     aspirin 81 MG chewable tablet Chew 1 tablet (81 mg total) by mouth 2 (two) times daily. 35 tablet 0   augmented betamethasone dipropionate (DIPROLENE) 0.05 % ointment Apply topically 2 (two) times daily. Limit to no longer than 2 weeks use. Not on face. (Patient taking differently: Apply 1 Application topically 2 (two) times daily as needed (hives). Limit to no longer than 2 weeks use. Not on face.) 50 g 0   benzonatate (TESSALON) 200 MG capsule Take 1 capsule (200 mg total) by mouth 2 (two) times daily as needed for cough. (Patient not taking: Reported on 09/13/2022) 20 capsule 0   budesonide (ENTOCORT EC) 3 MG 24 hr capsule Take 3 mg by mouth as needed for rash.     calcium carbonate (TUMS EX) 750 MG chewable tablet Chew 1 tablet by mouth daily.     clobetasol cream (TEMOVATE) 0.05 % Apply 1 application topically 2 (two) times daily as needed (rash).     clonazePAM (KLONOPIN) 1 MG tablet TAKE ONE TABLET BY MOUTH 2-3 TIMES A DAY AS NEEDED 70 tablet 0   cloNIDine (CATAPRES) 0.1 MG tablet TAKE 1 TABLET BY MOUTH AT BEDTIME. 90 tablet 2   cyanocobalamin (VITAMIN B12) 1000 MCG/ML injection INJECT 1 ML (1,000 MCG TOTAL) INTO THE MUSCLE EVERY 30 DAYS. 3 mL 4   Desoximetasone (TOPICORT) 0.25 % ointment Apply 1 application topically 2  (two) times daily. (Patient taking differently: Apply 1 application  topically 2 (two) times daily as needed (rash).) 30 g 1   diphenhydrAMINE (BENADRYL) 25 MG tablet Take 25-50 mg by mouth daily as needed for allergies.     EPINEPHrine 0.3 mg/0.3 mL IJ SOAJ injection Inject 0.3 mg into the muscle as needed for anaphylaxis.     fexofenadine (ALLEGRA) 180 MG tablet Take 180 mg by mouth daily as needed for allergies or rhinitis.     fluticasone (FLONASE) 50 MCG/ACT nasal spray Place 2 sprays into  both nostrils daily. (Patient taking differently: Place 2 sprays into both nostrils daily as needed for allergies.) 16 g 0   furosemide (LASIX) 20 MG tablet Take 1 tablet (20 mg total) by mouth daily. 5 tablet 0   HYDROcodone-acetaminophen (NORCO/VICODIN) 5-325 MG tablet Take 1-2 tablets by mouth every 6 (six) hours as needed for moderate pain. 30 tablet 0   hydrocortisone (ANUSOL-HC) 25 MG suppository Place 25 mg rectally as needed for hemorrhoids.     ibuprofen (ADVIL) 800 MG tablet Take 1 tablet (800 mg total) by mouth every 8 (eight) hours as needed. 60 tablet 3   labetalol (NORMODYNE) 100 MG tablet Take 0.5 tablets (50 mg total) by mouth 2 (two) times daily. 90 tablet 3   levETIRAcetam (KEPPRA) 250 MG tablet Take 250 mg by mouth See admin instructions. Take 250 mg by mouth at 8 AM, 2 PM, and 8 PM     levocetirizine (XYZAL) 5 MG tablet TAKE 1 TABLET BY MOUTH EVERY DAY AS NEEDED 30 tablet 0   lidocaine (XYLOCAINE) 2 % solution 5mL swish and swallow as needed for sore throat; take with nystatin susp 200 mL 0   methocarbamol (ROBAXIN) 500 MG tablet Take 1 tablet (500 mg total) by mouth every 6 (six) hours as needed for muscle spasms. 40 tablet 1   metoprolol tartrate (LOPRESSOR) 25 MG tablet TAKE 1 TABLET BY MOUTH TWICE A DAY 180 tablet 1   nystatin (MYCOSTATIN) 100000 UNIT/ML suspension Use as directed 5 mLs (500,000 Units total) in the mouth or throat 3 (three) times daily as needed (thrush). (Patient taking  differently: Use as directed 3 mLs in the mouth or throat 3 (three) times daily as needed (thrush).) 60 mL 2   Olopatadine HCl (PATADAY) 0.2 % SOLN Use one drop in each eye once daily as needed. (Patient taking differently: Place 1 drop into both eyes daily as needed (itchy eyes).) 2.5 mL 5   ondansetron (ZOFRAN-ODT) 4 MG disintegrating tablet Take 4 mg by mouth every 8 (eight) hours as needed for nausea/vomiting.     polyethylene glycol (MIRALAX / GLYCOLAX) packet Take 17 g by mouth at bedtime.     potassium chloride SA (KLOR-CON M) 20 MEQ tablet Take 1 tablet (20 mEq total) by mouth 2 (two) times daily. (Patient taking differently: Take 20 mEq by mouth See admin instructions. Take 20 mEq by mouth at 8 AM and 2 PM) 180 tablet 2   Probiotic Product (PROBIOTIC PO) Take 2 capsules by mouth daily.     prochlorperazine (COMPAZINE) 10 MG tablet Take 1 tablet (10 mg total) by mouth every 6 (six) hours as needed for nausea or vomiting. 60 tablet 0   sodium chloride (OCEAN) 0.65 % SOLN nasal spray Place 1 spray into both nostrils 2 (two) times daily.     Spacer/Aero-Holding Chambers [facility] DIAMOND) MISC See admin instructions.     Syringe/Needle, Disp, (SYRINGE 3CC/27GX1-1/4") 27G X 1-1/4" 3 ML MISC Inject 1 mL into the muscle every 30 (thirty) days. Vit B12 12 each 0   tiZANidine (ZANAFLEX) 2 MG tablet Take 1 tablet (2 mg total) by mouth every 6 (six) hours as needed for muscle spasms. 30 tablet 0   Vitamin D, Ergocalciferol, (DRISDOL) 50000 units CAPS capsule TAKE 1 CAPSULE (50,000 UNITS TOTAL) BY MOUTH EVERY 7 (SEVEN) DAYS. (Patient taking differently: Take 50,000 Units by mouth every Saturday.) 4 capsule 11   Zinc 50 MG TABS Take 50 mg by mouth daily.     Current Facility-Administered  Medications  Medication Dose Route Frequency Provider Last Rate Last Admin   mupirocin ointment (BACTROBAN) 2 %   Topical Daily Kathryne Hitch, MD        Allergies:   Bee venom, Doxycycline, Erythromycin,  Gadavist [gadobutrol], Paxlovid [nirmatrelvir-ritonavir], Penicillins, Sulfa antibiotics, Vancomycin, Preparation h [pramox-pe-glycerin-petrolatum], and Phenergan [promethazine hcl]   Social History:  The patient  reports that she has never smoked. She has never used smokeless tobacco. She reports that she does not drink alcohol and does not use drugs.   Family History:  The patient's family history includes Allergic rhinitis in her mother and sister; Aneurysm in her sister; Clotting disorder in her maternal grandmother; Colon cancer in her mother; Colon polyps in her mother; Crohn's disease in her maternal grandmother; Diabetes in her father, maternal grandmother, and paternal grandmother; Heart disease in her father; Kidney disease in her father; Stroke in her mother; Urticaria in her mother.  ROS:  Please see the history of present illness.    All other systems are reviewed and otherwise negative.   PHYSICAL EXAM:  VS:  There were no vitals taken for this visit. BMI: There is no height or weight on file to calculate BMI. Well nourished, well developed, in no acute distress HEENT: normocephalic, atraumatic Neck: no JVD, carotid bruits or masses Cardiac:  *** RRR; no significant murmurs, no rubs, or gallops Lungs:  *** CTA b/l, no wheezing, rhonchi or rales Abd: soft, nontender MS: no deformity or *** atrophy Ext: *** no edema Skin: warm and dry, no rash Neuro:  No gross deficits appreciated Psych: euthymic mood, full affect  *** PPM site is stable, no tethering or discomfort   EKG:  Done today and reviewed by myself shows      Device interrogation done today and reviewed by myself:  ***   09/17/22: LE venous US Summary:  BILATERAL:  - No evidence of deep vein thrombosis seen in the lower extremities,  bilaterally.  -No evidence of popliteal cyst, bilaterally.    05/31/20: TTE  1. Left ventricular ejection fraction, by estimation, is 60 to 65%. The  left ventricle has normal  function. The left ventricle has no regional  wall motion abnormalities. Left ventricular diastolic parameters are  consistent with Grade I diastolic  dysfunction (impaired relaxation).   2. Right ventricular systolic function is normal. The right ventricular  size is normal.   3. The mitral valve is normal in structure. No evidence of mitral valve  regurgitation. No evidence of mitral stenosis.   4. The aortic valve is normal in structure. Aortic valve regurgitation is  not visualized. No aortic stenosis is present.   5. The inferior vena cava is normal in size with greater than 50%  respiratory variability, suggesting right atrial pressure of 3 mmHg.    Recent Labs: 05/08/2022: ALT 22 05/19/2022: BUN 13; Creatinine, Ser 0.58; Hemoglobin 12.1; Platelets 216; Potassium 3.9; Sodium 140  No results found for requested labs within last 365 days.   CrCl cannot be calculated (Patient's most recent lab result is older than the maximum 21 days allowed.).   Wt Readings from Last 3 Encounters:  09/13/22 153 lb 6.4 oz (69.6 kg)  05/18/22 154 lb (69.9 kg)  05/17/22 154 lb (69.9 kg)     Other studies reviewed: Additional studies/records reviewed today include: summarized above  ASSESSMENT AND PLAN:  PPM ***  Dysautonomia ***  *** edema  Disposition: F/u with ***  Current medicines are reviewed at length with the patient  today.  The patient did not have any concerns regarding medicines.  Amanda Fredrickson, PA-C 09/27/2022 1:11 PM     [facility] [address] [name] [address] [phone] (office)  [phone] (fax)

## 2022-09-28 ENCOUNTER — Ambulatory Visit: Payer: Medicare Other | Admitting: Physician Assistant

## 2022-09-28 NOTE — Telephone Encounter (Signed)
Asking that the nurse gives her a call back, unable to make appt this morning. Please advise

## 2022-09-28 NOTE — Telephone Encounter (Signed)
Spoke with pt who is asking to reschedule missed appointment today with Maryella Shivers.  Pt states she would like to continue with Lymphedema  treatment (wrappings) but it was suggested she be further evaluated by cardiology. Pt's appointment scheduled with Dr Graciela Husbands for 10/11/2022.  She would like for Dr Graciela Husbands to advise if she can continue treatment until she is seen. Will forward to Dr Graciela Husbands for further review and recommendation.  Pt verbalizes understanding and agrees with current plan.

## 2022-10-01 ENCOUNTER — Ambulatory Visit: Payer: Medicare Other | Admitting: Orthopaedic Surgery

## 2022-10-01 NOTE — Telephone Encounter (Signed)
Spoke with pt and advised per Dr Graciela Husbands she may continue Lymphedema wrappings and plan to keep appointment as scheduled to see Dr Graciela Husbands on 08/15.   Pt reports increased ankle swelling after walking 2.6 miles yesterday.  Pt advised to decrease salt intake if possible, elevate feet and legs while sitting and drink fluids as recommended.  Pt verbalizes understanding and agrees with current plan.

## 2022-10-01 NOTE — Telephone Encounter (Signed)
I have no problmes wit it being continued

## 2022-10-02 ENCOUNTER — Ambulatory Visit: Payer: Medicare Other | Attending: Orthopaedic Surgery | Admitting: Physical Therapy

## 2022-10-02 ENCOUNTER — Encounter: Payer: Self-pay | Admitting: Physical Therapy

## 2022-10-02 DIAGNOSIS — C439 Malignant melanoma of skin, unspecified: Secondary | ICD-10-CM | POA: Diagnosis not present

## 2022-10-02 DIAGNOSIS — R262 Difficulty in walking, not elsewhere classified: Secondary | ICD-10-CM | POA: Insufficient documentation

## 2022-10-02 DIAGNOSIS — I890 Lymphedema, not elsewhere classified: Secondary | ICD-10-CM | POA: Diagnosis not present

## 2022-10-02 NOTE — Telephone Encounter (Signed)
Attempted to reach pt. Left a voicemail to contact us back.  

## 2022-10-02 NOTE — Therapy (Signed)
OUTPATIENT PHYSICAL THERAPY ONCOLOGY TREATMENT  Patient Name: [name] MRN: [id number] DOB:1955-06-15, 67 y.o., female Today's Date: 10/02/2022  END OF SESSION:  PT End of Session - 10/02/22 1554     Visit Number 7   for cancer   Number of Visits 18   for cancer rehab   Date for PT Re-Evaluation 10/12/22   for cancer rehab   PT Start Time 1507    PT Stop Time 1600    PT Time Calculation (min) 53 min    Activity Tolerance Patient tolerated treatment well    Behavior During Therapy [facility] for tasks assessed/performed                 Past Medical History:  Diagnosis Date   Anemia    Angio-edema    Anxiety    Arthritis    Asthma    Atrial fibrillation (HCC)    Atrial tachycardia    Autonomic dysfunction    Complication of anesthesia    pt states wakes up with "shakes"   CVA (cerebral infarction)    2012 with dizziness and vision change felt embolic from atrial tachy   Disorder of bone and cartilage, unspecified    Diverticulosis    DM (diabetes mellitus) (HCC)    DVT (deep venous thrombosis) (HCC)    Eczema    Family history of anesthesia complication    PONV and " shaking "   Fatty liver    GERD (gastroesophageal reflux disease)    History of hiatal hernia    History of radiation therapy 10/24/2012   brain   HLD (hyperlipidemia)    HTN (hypertension)    Hypoglycemia, unspecified    Liver metastasis    Lung metastasis    Metastasis to brain (HCC)    Osteopenia    Other nonspecific abnormal serum enzyme levels    Other specified congenital anomalies of nervous system    Pacemaker    autonomic dysfunction   Pancreatitis    from therapy   Peripheral vascular disease (HCC)    POTS (postural orthostatic tachycardia syndrome)    Raynaud's disease    due to chemo   Skin cancer    Hx: of lung lesion   Past Surgical History:  Procedure Laterality Date   ABLATION SAPHENOUS VEIN W/ RFA     2002 x3   CARPAL TUNNEL RELEASE Left 2000   CHOLECYSTECTOMY  N/A 03/28/2018   Procedure: LAPAROSCOPIC CHOLECYSTECTOMY WITH INTRAOPERATIVE CHOLANGIOGRAM;  Surgeon: Luretha Murphy, MD;  Location: [facility];  Service: General;  Laterality: N/A;   CRANIOTOMY N/A 09/30/2012   suboccipital craniectomy   CRANIOTOMY Left 02/09/2013   Procedure: LEFT PARIETAL CRANIOTOMY with stealth;  Surgeon: Barnett Abu, MD;  Location: [facility];  Service: Neurosurgery;  Laterality: Left;  LEFT Parietal Craniotomy for tumor with stealth   DEEP AXILLARY SENTINEL NODE BIOPSY / EXCISION     due to extensive Melanoma-right arm   fatty tumor removed  2000   from chest   INSERT / REPLACE / REMOVE PACEMAKER  2012   1999, x 3   LAPAROSCOPY  09/06/2011   Procedure: LAPAROSCOPY OPERATIVE;  Surgeon: Tresa Endo A. Ernestina Penna, MD;  Location: [facility];  Service: Gynecology;  Laterality: Left;  with Left Ovarian Cystectomy    MELANOMA EXCISION     with removal of lymph nodes, left shoulder   NOSE SURGERY  2010, 2012   for nose bleeds x 2   SINOSCOPY     TONSILLECTOMY AND ADENOIDECTOMY  TOTAL HIP ARTHROPLASTY Right 05/18/2022   Procedure: RIGHT TOTAL HIP ARTHROPLASTY ANTERIOR APPROACH;  Surgeon: Kathryne Hitch, MD;  Location: [facility];  Service: Orthopedics;  Laterality: Right;   Patient Active Problem List   Diagnosis Date Noted   Status post total replacement of right hip 05/18/2022   Avascular necrosis of bone of right hip (HCC) 04/19/2022   Moderate persistent asthma 12/08/2018   Perennial allergic rhinitis with predominantly nonallergic component 12/08/2018   Allergic conjunctivitis 12/08/2018   History of epistaxis 12/08/2018   Atopic dermatitis 12/08/2018   Recurrent urticaria 12/08/2018   Skeeter syndrome 12/08/2018   Skin lesion of chest wall 07/22/2018   S/P laparoscopic cholecystectomy 03/28/2018   Dehydration 05/18/2016   Type 2 diabetes mellitus without complication, without long-term current use of insulin (HCC) 11/01/2015   History of pacemaker    POTS  (postural orthostatic tachycardia syndrome)    Iron deficiency anemia 11/11/2014   Chronic colitis 08/28/2013   Malignant melanoma, metastatic (HCC) 02/09/2013   Malignant melanoma (HCC) 12/01/2012   Brain metastasis 10/16/2012   Hypertension 09/30/2012   Anxiety state, unspecified 09/30/2012   Hypoxemia 09/30/2012   Disorder of brain 09/08/2012   Dysautonomia (HCC) 02/25/2011   Anticoagulant long-term use 01/19/2011   Neuritis 01/19/2011   Skin change 01/19/2011   CVA (cerebral infarction)    Palpitation 06/28/2010   Pacemaker-Biotronik 06/28/2010   Dizziness and giddiness 04/27/2010   DIVERTICULOSIS, COLON, HX OF 01/19/2009   CAROTID SINUS SYNDROME 11/17/2008   OTHER MALAISE AND FATIGUE 11/17/2008   HYPERTENSION, BENIGN 11/02/2008   ATRIAL TACHYCARDIA 09/16/2008   SYNCOPE, HX OF 08/09/2008   INJURY UNSPEC NERVE SHOULDER GIRDLE&UPPER LIMB 07/12/2008   PERSONAL HISTORY OF MALIGNANT MELANOMA OF SKIN 07/12/2008   LUQ PAIN 12/08/2007   NECK PAIN 09/26/2007   NUMBNESS, ARM 09/26/2007   CPK, ABNORMAL 09/26/2007   ARM PAIN, LEFT 06/25/2007   SWELLING OF LIMB 06/25/2007   OSTEOPENIA 06/25/2007    PCP: Berniece Andreas, MD  REFERRING PROVIDER: Doneen Poisson, MD  REFERRING DIAG:  M79.89 (ICD-10-CM) - Swelling of limb C43.9 (ICD-10-CM) - Malignant melanoma, metastatic (HCC) Z96.641 (ICD-10-CM) - Status post total replacement of right hip    THERAPY DIAG:  Lymphedema, not elsewhere classified  Difficulty in walking, not elsewhere classified  Malignant melanoma, unspecified [facility])  ONSET DATE: 05/18/22  Rationale for Evaluation and Treatment: Rehabilitation  SUBJECTIVE:                                                                                                                                                                                           SUBJECTIVE STATEMENT: I saw my  cardiologist in a virtual visit and he assured me that nothing was cardiac  related. The oncologist said the mass in my groin was a fatty tumor filled with lymphatic fluid. The doctor was concerned with me falling if we wrap.   PERTINENT HISTORY:  Hx of metastatic melanoma beginning in 2009 with lesion removed from RUE and ALND, has nodes removed from L axilla previously as well, 2014 metastasized to brain and underwent surgery and radiation, tumor removed from R groin in  2016 or 2017 (pt unable to recall) and 2 nodes removed, R THA 05/18/22, PMH includes but is not limited to: anxiety, asthma, DM-II, HTN, CVA, DVT, POTS, pacemaker, malignant melanoma with brain, liver and lung metastasis s/p radiation and L parietal craniotomy, osteopenia, diverticulitis, GERD, and Raynaud's disease.  PAIN:  Are you having pain? No just discomfort in LLE  PRECAUTIONS: Anterior hip and ICD/Pacemaker  WEIGHT BEARING RESTRICTIONS: No  FALLS:  Has patient fallen in last 6 months? 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  LEISURE: walks 3-4 miles several times a week, does all the hip strengthening exercises from rehab  HAND DOMINANCE: right   PRIOR LEVEL OF FUNCTION: Independent except when getting something off the floor  PATIENT GOALS: to go back to normal, decrease swelling and pain   OBJECTIVE:  COGNITION: Overall cognitive status: Within functional limits for tasks assessed   PALPATION: Fibrosis near lateral hip in area of edema  OBSERVATIONS / OTHER ASSESSMENTS: R LE visibly more full from knee to groin compared to L LE   UPPER EXTREMITY AROM/PROM: [facility]    LYMPHEDEMA ASSESSMENTS:   SURGERY TYPE/DATE: numerous surgeries, R ALND in 2009, surgery to remove R groin tumor in 2016 or 2017  NUMBER OF LYMPH NODES REMOVED: ALND in 2009, 2 nodes in 2016/2017 from R  groin  RADIATION:completed to brain   LYMPHEDEMA ASSESSMENTS:   LANDMARK RIGHT  eval  10 cm proximal to olecranon process 24.6  Olecranon process 24  10 cm proximal to ulnar styloid process 20.7  Just proximal to ulnar styloid process 14.7  Across hand at thumb web space 17.7  At base of 2nd digit 5.7  (Blank rows = not tested)  LANDMARK LEFT  eval  10 cm proximal to olecranon process 24.7  Olecranon process 24  10 cm proximal to ulnar styloid process 19.9  Just proximal to ulnar styloid process 14.5  Across hand at thumb web space 17.8  At base of 2nd digit 5.9  (Blank rows = not tested)   LOWER EXTREMITY LANDMARK RIGHT eval RIGHT 09/05/22 RIGHT  09/12/22  At groin     30 cm proximal to suprapatella 59.4 58 59  20 cm proximal to suprapatella 56 55.5 57.5  10 cm proximal to suprapatella 45.5 45.2 47  At midpatella / popliteal crease 38 38 38  30 cm proximal to floor at lateral plantar foot 40.4 39.7 40.4  20 cm proximal to floor at lateral plantar foot 31.2 31.1 30.2  10 cm proximal to floor at lateral plantar foot 21.7 21.6 22  Circumference of ankle/heel     5 cm proximal to 1st MTP joint 22.1 22.4 22.5  Across MTP joint 23.5 21.8 23.1  Around proximal great toe 7.9 7.3 7.8  (Blank rows = not tested)  LOWER EXTREMITY  LANDMARK LEFT eval LEFT 09/12/22  At groin    30 cm proximal to suprapatella 56 55.8  20 cm proximal to suprapatella 52.3 53  10 cm proximal to suprapatella 44.9 46  At midpatella / popliteal crease 38.4 38.1  30 cm proximal to floor at lateral plantar foot 38.5 39.5  20 cm proximal to floor at lateral plantar foot 30 30  10  cm proximal to floor at lateral plantar foot 21.5 22.2  Circumference of ankle/heel    5 cm proximal to 1st MTP joint 21.9 23  Across MTP joint 22.3 23.9  Around proximal great toe 7 7.2  (Blank rows = not tested)  LLIS: 72  TODAY'S TREATMENT:                                                                                                                                          DATE:  10/02/22: Continued MLD to R LE and instructed pt: Manual lymph drainage in supine: short neck, 5 diaphragmatic breaths, right inguinal nodes INTACT pathway, right lower extremity working proximal and moving distally then retracing steps 09/24/22:  See assessment for more detail Educated pt on compression bandaging/pump and possible active cancer and why this is contraindicated Discussed onset of LLE edema and possibly cardiac causes (pt has pacemaker)  09/12/22: Began MLD to R LE and instructed pt throughout and issued handout as follows: Manual lymph drainage in supine: short neck, 5 diaphragmatic breaths, right inguinal nodes INTACT pathway (see assessment), right lower extremity from toes and dorsal foot to lateral hip redirecting along pathway.  Did not perform bandaging due to fear of falling, pt has a doctor's appointment tomorrow and will be alone and due to recent onset of LLE edema with no known etiology and pt has hx of pacemaker - see assessment  09/10/22:  Pt arrived with bandages off due to fear of falling when by herself with bandages on Began MLD to R LE and instructed pt throughout and issued handout as follows: Manual lymph drainage in supine: short neck, 5 diaphragmatic breaths, left inguinal nodes and establishment of interinguinal pathway, right lower extremity from toes and dorsal foot to lateral hip redirecting along pathway.   09/05/22: Pt arrived with bandages off due to taking a shower and her husband was not home to reapply She reports she is fearful of falling with the bandages and did not bring her post op shoe to wear after today's appt so she requested to hold bandaging and her husband will apply them tonight Measured UE circumferences for baseline since pt is at risk for R UE lymphedema Remeasured R LE circumferences and she demonstrates good reduction at foot at groin Measured pt for flat knit  compression thigh high for long term day time management - ExoStrong Medium Avg Thigh high with silicone border Discussed how to manage lymphedema long term, answered pt's questions and discussed need  for compression during waking hours because the swelling will return  09/03/22: Began instructing pt and spouse in compression bandaging technique to RLE as follows and pt's spouse took a video on his phone to practice at home: TG soft small from foot to knee, TG soft medium from knee the thigh, Elastomull to digits 1-4, artiflex from foot to knee and then from knee to thigh, 1 6 cm bandage at foot, 1 8 cm heel/ankle/sole, 1 10 cm from ankle to knee, 1 12 cm bandage from mid lower leg to above knee with "x" at knee, 1 12 cm bandage from knee to groin in herringbone, 1 12 cm bandage from above knee to groin. Educated pt to wear her spanx compression shorts on top to provide compression to her hip where she has increased swelling. Put shoe covers on R foot and she is going to get a post op shoe at [facility] after this appointment. Educated pt in walking with cane with bandages intact for balance. Practiced ascending/descending stairs with cane and bandages intact to ensure pt felt safe. She did well with all. Will have pt's spouse return demo bandaging at next session.   08/31/22: Educated pt and spouse on anatomy and physiology of the lymphatic system and what to expect with CDT, educated to purchase post op shoe to wear with bandaging, educated to decrease exercise some due to pain in evening after walking 3-4 miles as a result of edema  PATIENT EDUCATION:  Education details: compression bandaging technique, remove bandages if foot turns blue or becomes numb and does not improve with movement, encouraged to move and exercise with bandages intact Person educated: Patient and Spouse Education method: Explanation Education comprehension: verbalized understanding  HOME EXERCISE PROGRAM: Wear bike shorts for  hip swelling Wear compression leggings when exercising  ASSESSMENT:  CLINICAL IMPRESSION: Pt was cleared by her oncologist and cardiologist to resume therapy. Continued MLD to RLE today. Did not bandage today because pt came alone and due to increased fall risk did not apply bandaging. Will apply at next session if pt's husband accompanies her to the appointment.   OBJECTIVE IMPAIRMENTS: decreased knowledge of condition, decreased knowledge of use of DME, increased edema, and pain.   ACTIVITY LIMITATIONS: squatting and sleeping  PARTICIPATION LIMITATIONS: community activity and exercise  PERSONAL FACTORS: Past/current experiences, Time since onset of injury/illness/exacerbation, and 1 comorbidity: history of R ALND  are also affecting patient's functional outcome.   REHAB POTENTIAL: Good  CLINICAL DECISION MAKING: Stable/uncomplicated  EVALUATION COMPLEXITY: Low  GOALS: Goals reviewed with patient? Yes  SHORT TERM GOALS: Target date: 09/28/22  Pt and/or spouse will be independent with compression bandaging for long term management of lymphedema.  Baseline: Goal status: INITIAL  2.  Pt and/or spouse will be independent in self MLD for long term management of lymphedema.  Baseline:  Goal status: INITIAL  3.  Pt will demonstrate a 1 cm decrease in edema at 30 cm superior to patella to decrease risk of infection.  Baseline:  Goal status: INITIAL    LONG TERM GOALS: Target date: 10/12/22  Pt will obtain appropriate compression garments for RLE lymphedema and for prophylactic use on RUE.  Baseline:  Goal status: INITIAL  2.  Pt will be able to sleep through the night after exercising without increase LE discomfort from lymphedema.  Baseline:  Goal status: INITIAL   PLAN:  PT FREQUENCY: 3x/week  PT DURATION: 6 weeks  PLANNED INTERVENTIONS: Therapeutic exercises, Therapeutic activity, Patient/Family education, Self Care,  Joint mobilization, Orthotic/Fit training, Manual  lymph drainage, Compression bandaging, scar mobilization, Taping, Vasopneumatic device, Manual therapy, and Re-evaluation  PLAN FOR NEXT SESSION: did she receive her garment? decrease to 1-2x/wk once pt and or spouse is indep with MLD and bandaging, begin CDT on RLE ++++ has hx of R ALND, and L SLNB++++ instruct in bandaging to begin with and then MLD, eventually measure for prophylactic R UE sleeve and then R LE garment    [employer], PT 10/02/2022, 4:03 PM

## 2022-10-04 ENCOUNTER — Ambulatory Visit: Payer: Medicare Other | Admitting: Physical Therapy

## 2022-10-08 ENCOUNTER — Encounter: Payer: Self-pay | Admitting: Orthopaedic Surgery

## 2022-10-08 ENCOUNTER — Ambulatory Visit (INDEPENDENT_AMBULATORY_CARE_PROVIDER_SITE_OTHER): Payer: Medicare Other | Admitting: Orthopaedic Surgery

## 2022-10-08 ENCOUNTER — Other Ambulatory Visit (INDEPENDENT_AMBULATORY_CARE_PROVIDER_SITE_OTHER): Payer: Medicare Other

## 2022-10-08 DIAGNOSIS — Z96641 Presence of right artificial hip joint: Secondary | ICD-10-CM

## 2022-10-08 DIAGNOSIS — M7062 Trochanteric bursitis, left hip: Secondary | ICD-10-CM | POA: Diagnosis not present

## 2022-10-08 DIAGNOSIS — M7632 Iliotibial band syndrome, left leg: Secondary | ICD-10-CM

## 2022-10-08 NOTE — Progress Notes (Signed)
[name] is now 5 months status post a right total hip arthroplasty.  She is dealing with lymphedema and is seeing a therapist for that with getting wraps.  They have recommended machines on her legs at night.  She has been dealing with left hip pain but no pain in the groin on the left side.  Examination of both hips show the most smoothly and fluidly with no pain in the groin on either side.  Her left hip though has significant pain all along the IT band from the knee all the way up to the trochanteric area of the left hip.  An AP pelvis and lateral both hips shows a well-seated right total hip arthroplasty.  The left hip shows a normal-appearing hip.  [name] is definitely trochanteric bursitis and IT band syndrome.  I would like to send her to outpatient physical therapy with any modalities including definitely dry needling on that left side to see if this will help decrease her symptoms.  I would not recommend any type of steroid injections given her history of AVN on the right side that was definitely related to steroid treatment in the past for her cancer issues.  She agrees with outpatient physical therapy if and we will see her back in 6 weeks to see how she is doing from that overall.

## 2022-10-09 ENCOUNTER — Other Ambulatory Visit: Payer: Self-pay

## 2022-10-09 DIAGNOSIS — M7632 Iliotibial band syndrome, left leg: Secondary | ICD-10-CM

## 2022-10-10 ENCOUNTER — Other Ambulatory Visit: Payer: Self-pay

## 2022-10-10 DIAGNOSIS — Z79899 Other long term (current) drug therapy: Secondary | ICD-10-CM

## 2022-10-10 NOTE — Telephone Encounter (Signed)
Spoke to pt. Pt states she is not taking the medication daily, only as needed. She continue that she doesn't want to continue with the medication and prefer doing wrapping on her legs.   Then pt states we can send in medication in case she needs it. Denied being on medication for more than 2 wks.   Inform her we can go a head send in the medication. Order the lab. Verbalized understanding.

## 2022-10-11 ENCOUNTER — Ambulatory Visit: Payer: Medicare Other | Attending: Internal Medicine | Admitting: Internal Medicine

## 2022-10-11 ENCOUNTER — Encounter: Payer: Self-pay | Admitting: Internal Medicine

## 2022-10-11 VITALS — BP 114/87 | HR 79 | Ht 63.0 in | Wt 155.4 lb

## 2022-10-11 DIAGNOSIS — I4719 Other supraventricular tachycardia: Secondary | ICD-10-CM

## 2022-10-11 DIAGNOSIS — Z950 Presence of cardiac pacemaker: Secondary | ICD-10-CM

## 2022-10-11 MED ORDER — FUROSEMIDE 20 MG PO TABS: ORAL_TABLET | ORAL | 0 refills | Status: DC

## 2022-10-11 NOTE — Progress Notes (Signed)
Patient Care Team: Madelin Headings, MD as PCP - General Duke Salvia, MD as PCP - Electrophysiology (Cardiology) Barnett Abu, MD as Consulting Physician (Neurosurgery) Ladene Artist, MD as Consulting Physician (Oncology) Roland Rack, MD as Referring Physician (Internal Medicine) Flo Shanks, MD as Consulting Physician (Otolaryngology) Arminda Resides, MD as Consulting Physician (Dermatology) Irena Cords, Enzo Montgomery, MD as Consulting Physician (Allergy and Immunology) Sherrill Raring, [facility] (Inactive) (Pharmacist)   HPI  [name] is a 67 y.o. female Seen in follow-up for dysautonomia as well as pacemaker-Biotronik implanted for dysautonomia and bradycardia for device interrogation Hx of .  She was seen a few weeks ago at the time of her MRI  The patient denies chest pain , shortness of breath, nocturnal dyspnea, orthopnea or peripheral edema.  There have been no syncope.    Complains of cold hands and feet.  Arterial Dopplers of upper lower extremities were normal; perhaps aggravated by cold.  Difficult warm up.  Somewhat painful.  Status post hip arthroplasty 3/24 complicated by trochanteric bursitis and IT band syndrome Intercurrently diagnosed with lymphedema and undergoing leg wrapping, hypothesized because of a history of prior lymph node resection and asymmetric edema right greater than left.  Then developed bilateral edema still right greater than left, recalling that her surgery was on the right.  She saw her PCP and received 3 doses of furosemide (20 mg) with brisk diuresis relief of swelling but complicated by what she thought was hypomagnesemia as she had cramping and it had this before  Venous Doppler was negative      DATE TEST EF   4/22 Echo   60-65 %   7/24 Chest CT    No thoracic mets Not gated for PE   Date Cr K Hgb  3/24 0.58 3.9 12.1           Past Medical History:  Diagnosis Date   Anemia    Angio-edema    Anxiety    Arthritis     Asthma    Atrial fibrillation (HCC)    Atrial tachycardia    Autonomic dysfunction    Complication of anesthesia    pt states wakes up with "shakes"   CVA (cerebral infarction)    2012 with dizziness and vision change felt embolic from atrial tachy   Disorder of bone and cartilage, unspecified    Diverticulosis    DM (diabetes mellitus) (HCC)    DVT (deep venous thrombosis) (HCC)    Eczema    Family history of anesthesia complication    PONV and " shaking "   Fatty liver    GERD (gastroesophageal reflux disease)    History of hiatal hernia    History of radiation therapy 10/24/2012   brain   HLD (hyperlipidemia)    HTN (hypertension)    Hypoglycemia, unspecified    Liver metastasis    Lung metastasis    Metastasis to brain (HCC)    Osteopenia    Other nonspecific abnormal serum enzyme levels    Other specified congenital anomalies of nervous system    Pacemaker    autonomic dysfunction   Pancreatitis    from therapy   Peripheral vascular disease (HCC)    POTS (postural orthostatic tachycardia syndrome)    Raynaud's disease    due to chemo   Skin cancer    Hx: of lung lesion    Past Surgical History:  Procedure Laterality Date   ABLATION SAPHENOUS VEIN W/ RFA  2002 x3   CARPAL TUNNEL RELEASE Left 2000   CHOLECYSTECTOMY N/A 03/28/2018   Procedure: LAPAROSCOPIC CHOLECYSTECTOMY WITH INTRAOPERATIVE CHOLANGIOGRAM;  Surgeon: Luretha Murphy, MD;  Location: [facility] OR;  Service: General;  Laterality: N/A;   CRANIOTOMY N/A 09/30/2012   suboccipital craniectomy   CRANIOTOMY Left 02/09/2013   Procedure: LEFT PARIETAL CRANIOTOMY with stealth;  Surgeon: Barnett Abu, MD;  Location: [facility] OR[facility];  Service: Neurosurgery;  Laterality: Left;  LEFT Parietal Craniotomy for tumor with stealth   DEEP AXILLARY SENTINEL NODE BIOPSY / EXCISION     due to extensive Melanoma-right arm   fatty tumor removed  2000   from chest   INSERT / REPLACE / REMOVE PACEMAKER  2012   1999, x 3    LAPAROSCOPY  09/06/2011   Procedure: LAPAROSCOPY OPERATIVE;  Surgeon: Tresa Endo A. Ernestina Penna, MD;  Location: [facility] ORS;  Service: Gynecology;  Laterality: Left;  with Left Ovarian Cystectomy    MELANOMA EXCISION     with removal of lymph nodes, left shoulder   NOSE SURGERY  2010, 2012   for nose bleeds x 2   SINOSCOPY     TONSILLECTOMY AND ADENOIDECTOMY     TOTAL HIP ARTHROPLASTY Right 05/18/2022   Procedure: RIGHT TOTAL HIP ARTHROPLASTY ANTERIOR APPROACH;  Surgeon: Kathryne Hitch, MD;  Location: [facility] ORS;  Service: Orthopedics;  Laterality: Right;    Current Outpatient Medications  Medication Sig Dispense Refill   acetaminophen (TYLENOL) 500 MG tablet Take 500 mg by mouth See admin instructions. Take 500 mg by mouth at bedtime and an additional 500 mg three times a day as needed for pain     albuterol (PROVENTIL) (2.5 MG/3ML) 0.083% nebulizer solution Take 3 mLs (2.5 mg total) by nebulization every 4 (four) hours as needed for wheezing or shortness of breath. 180 mL 1   albuterol (VENTOLIN HFA) 108 (90 Base) MCG/ACT inhaler INHALE 2 PUFFS INTO THE LUNGS EVERY 4 (FOUR) HOURS AS NEEDED FOR WHEEZING OR SHORTNESS OF BREATH. 18 Inhaler 0   Ascorbic Acid (VITAMIN C) 1000 MG tablet Take 2,000 mg by mouth daily.     aspirin 81 MG chewable tablet Chew 1 tablet (81 mg total) by mouth 2 (two) times daily. 35 tablet 0   augmented betamethasone dipropionate (DIPROLENE) 0.05 % ointment Apply topically 2 (two) times daily. Limit to no longer than 2 weeks use. Not on face. (Patient taking differently: Apply 1 Application topically 2 (two) times daily as needed (hives). Limit to no longer than 2 weeks use. Not on face.) 50 g 0   benzonatate (TESSALON) 200 MG capsule Take 1 capsule (200 mg total) by mouth 2 (two) times daily as needed for cough. 20 capsule 0   budesonide (ENTOCORT EC) 3 MG 24 hr capsule Take 3 mg by mouth as needed for rash.     calcium carbonate (TUMS EX) 750 MG chewable tablet Chew 1 tablet by  mouth daily.     clobetasol cream (TEMOVATE) 0.05 % Apply 1 application topically 2 (two) times daily as needed (rash).     clonazePAM (KLONOPIN) 1 MG tablet TAKE ONE TABLET BY MOUTH 2-3 TIMES A DAY AS NEEDED 70 tablet 0   cloNIDine (CATAPRES) 0.1 MG tablet TAKE 1 TABLET BY MOUTH AT BEDTIME. 90 tablet 2   cyanocobalamin (VITAMIN B12) 1000 MCG/ML injection INJECT 1 ML (1,000 MCG TOTAL) INTO THE MUSCLE EVERY 30 DAYS. 3 mL 4   Desoximetasone (TOPICORT) 0.25 % ointment Apply 1 application topically 2 (two) times daily. (Patient  taking differently: Apply 1 application  topically 2 (two) times daily as needed (rash).) 30 g 1   diphenhydrAMINE (BENADRYL) 25 MG tablet Take 25-50 mg by mouth daily as needed for allergies.     EPINEPHrine 0.3 mg/0.3 mL IJ SOAJ injection Inject 0.3 mg into the muscle as needed for anaphylaxis.     fexofenadine (ALLEGRA) 180 MG tablet Take 180 mg by mouth daily as needed for allergies or rhinitis.     fluticasone (FLONASE) 50 MCG/ACT nasal spray Place 2 sprays into both nostrils daily. (Patient taking differently: Place 2 sprays into both nostrils daily as needed for allergies.) 16 g 0   furosemide (LASIX) 20 MG tablet TAKE 1 TABLET BY MOUTH DAILY 5 tablet 0   HYDROcodone-acetaminophen (NORCO/VICODIN) 5-325 MG tablet Take 1-2 tablets by mouth every 6 (six) hours as needed for moderate pain. 30 tablet 0   hydrocortisone (ANUSOL-HC) 25 MG suppository Place 25 mg rectally as needed for hemorrhoids.     ibuprofen (ADVIL) 800 MG tablet Take 1 tablet (800 mg total) by mouth every 8 (eight) hours as needed. 60 tablet 3   labetalol (NORMODYNE) 100 MG tablet Take 0.5 tablets (50 mg total) by mouth 2 (two) times daily. 90 tablet 3   levETIRAcetam (KEPPRA) 250 MG tablet Take 250 mg by mouth See admin instructions. Take 250 mg by mouth at 8 AM, 2 PM, and 8 PM     levocetirizine (XYZAL) 5 MG tablet TAKE 1 TABLET BY MOUTH EVERY DAY AS NEEDED 30 tablet 0   lidocaine (XYLOCAINE) 2 % solution  5mL swish and swallow as needed for sore throat; take with nystatin susp 200 mL 0   methocarbamol (ROBAXIN) 500 MG tablet Take 1 tablet (500 mg total) by mouth every 6 (six) hours as needed for muscle spasms. 40 tablet 1   metoprolol tartrate (LOPRESSOR) 25 MG tablet TAKE 1 TABLET BY MOUTH TWICE A DAY 180 tablet 1   nystatin (MYCOSTATIN) 100000 UNIT/ML suspension Use as directed 5 mLs (500,000 Units total) in the mouth or throat 3 (three) times daily as needed (thrush). (Patient taking differently: Use as directed 3 mLs in the mouth or throat 3 (three) times daily as needed (thrush).) 60 mL 2   Olopatadine HCl (PATADAY) 0.2 % SOLN Use one drop in each eye once daily as needed. (Patient taking differently: Place 1 drop into both eyes daily as needed (itchy eyes).) 2.5 mL 5   ondansetron (ZOFRAN-ODT) 4 MG disintegrating tablet Take 4 mg by mouth every 8 (eight) hours as needed for nausea/vomiting.     polyethylene glycol (MIRALAX / GLYCOLAX) packet Take 17 g by mouth at bedtime.     potassium chloride SA (KLOR-CON M) 20 MEQ tablet Take 1 tablet (20 mEq total) by mouth 2 (two) times daily. (Patient taking differently: Take 20 mEq by mouth See admin instructions. Take 20 mEq by mouth at 8 AM and 2 PM) 180 tablet 2   Probiotic Product (PROBIOTIC PO) Take 2 capsules by mouth daily.     prochlorperazine (COMPAZINE) 10 MG tablet Take 1 tablet (10 mg total) by mouth every 6 (six) hours as needed for nausea or vomiting. 60 tablet 0   sodium chloride (OCEAN) 0.65 % SOLN nasal spray Place 1 spray into both nostrils 2 (two) times daily.     Spacer/Aero-Holding Chambers [facility] DIAMOND) MISC See admin instructions.     Syringe/Needle, Disp, (SYRINGE 3CC/27GX1-1/4") 27G X 1-1/4" 3 ML MISC Inject 1 mL into the muscle every 30 (  thirty) days. Vit B12 12 each 0   tiZANidine (ZANAFLEX) 2 MG tablet Take 1 tablet (2 mg total) by mouth every 6 (six) hours as needed for muscle spasms. 30 tablet 0   Vitamin D,  Ergocalciferol, (DRISDOL) 50000 units CAPS capsule TAKE 1 CAPSULE (50,000 UNITS TOTAL) BY MOUTH EVERY 7 (SEVEN) DAYS. (Patient taking differently: Take 50,000 Units by mouth every Saturday.) 4 capsule 11   Zinc 50 MG TABS Take 50 mg by mouth daily.     Current Facility-Administered Medications  Medication Dose Route Frequency Provider Last Rate Last Admin   mupirocin ointment (BACTROBAN) 2 %   Topical Daily Kathryne Hitch, MD        Allergies  Allergen Reactions   Bee Venom Swelling   Doxycycline Hives   Erythromycin Hives   Gadavist [Gadobutrol] Hives, Shortness Of Breath and Itching   Paxlovid [Nirmatrelvir-Ritonavir] Swelling and Other (See Comments)    Mouth swelling, mouth ulcers   Penicillins Hives    Did it involve swelling of the face/tongue/throat, SOB, or low BP? No Did it involve sudden or severe rash/hives, skin peeling, or any reaction on the inside of your mouth or nose? Yes Did you need to seek medical attention at a hospital or doctor's office? Yes When did it last happen?      5+ years If all above answers are "NO", may proceed with cephalosporin use.    Sulfa Antibiotics Hives   Vancomycin Hives, Rash and Other (See Comments)    Develops "Red Man" Syndrome and welts/hives if it is run too quickly via IV- must be run slowly   Preparation H [Pramox-Pe-Glycerin-Petrolatum] Hives    Cannot use  Cream or Suppositories    Phenergan [Promethazine Hcl] Other (See Comments)    Causes restless leg syndrome    Review of Systems negative except from HPI and PMH  Physical Exam BP 114/87 (Patient Position: Standing)   Pulse 79   Ht 5\' 3"  (1.6 m)   Wt 155 lb 6.4 oz (70.5 kg)   SpO2 96%   BMI 27.53 kg/m  Well developed and well nourished in no acute distress HENT normal Neck supple with JVP-6-8 -HJR Clear Device pocket well healed; without hematoma or erythema.  There is no tethering  Regular rate and rhythm, no   gallop No  murmur Abd-soft with active  BS No Clubbing cyanosis 1+ R trace L edema Skin-warm and dry A & Oriented  Grossly normal sensory and motor function  ECG Apacing 70/18/07/39  Device function is normal. Programming changes none   See Paceart for details     Assessment and  Plan Edema  s/p R THR  Atrial  Tach  Pacemaker Biotronik     Dysautonomia   Sinus node dysfunction   Raynaud's    Post hip replacement developed bilateral but right greater than left swelling.  Initially was thought to be lymphedema, and indeed apparently there are some positive stabilization has been measured for a pump.  However, with the development of bilateral edema she was referred to her PCP who gave her furosemide any improvement raise the possibility of it being cardiovascular.  Right-sided edema in the absence of dyspnea would suggest a right heart problem without a left heart problem a noncardiac cause like hypoalbuminemia.  It would be inconsistent with pulmonary embolism not withstanding her postoperative situation. Will check an echocardiogram.  Check a complete metabolic profile and assess the albumin.  I suggest that she follow-up with a lymph clinic  and have them do lymphangiogram to see whether lymphedema is in fact the right diagnosis.  We also challenged by the dysautonomia and her tendency towards orthostasis.  Increasing her sodium intake given the heat may have been part of it as it has been so very hot.  Encouraged her to decrease his sodium intake is much as she can and that we have to go between Scylla and Charybdis navigating between orthostasis and edema

## 2022-10-11 NOTE — Patient Instructions (Addendum)
Medication Instructions:  START Furosemide 20 mg daily as needed - #30 with no refills  *If you need a refill on your cardiac medications before your next appointment, please call your pharmacy*   Lab Work: CMET TODAY If you have labs (blood work) drawn today and your tests are completely normal, you will receive your results only by: MyChart Message (if you have MyChart) OR A paper copy in the mail If you have any lab test that is abnormal or we need to change your treatment, we will call you to review the results.   Testing/Procedures: Echocardiogram Your physician has requested that you have an echocardiogram. Echocardiography is a painless test that uses sound waves to create images of your heart. It provides your doctor with information about the size and shape of your heart and how well your heart's chambers and valves are working. This procedure takes approximately one hour. There are no restrictions for this procedure. Please do NOT wear cologne, perfume, aftershave, or lotions (deodorant is allowed). Please arrive 15 minutes prior to your appointment time.   Follow-Up: At [facility], you and your health needs are our priority.  As part of our continuing mission to provide you with exceptional heart care, we have created designated Provider Care Teams.  These Care Teams include your primary Cardiologist (physician) and Advanced Practice Providers (APPs -  Physician Assistants and Nurse Practitioners) who all work together to provide you with the care you need, when you need it.  We recommend signing up for the patient portal called "MyChart".  Sign up information is provided on this After Visit Summary.  MyChart is used to connect with patients for Virtual Visits (Telemedicine).  Patients are able to view lab/test results, encounter notes, upcoming appointments, etc.  Non-urgent messages can be sent to your provider as well.   To learn more about what you can do with  MyChart, go to [email or web address].    Your next appointment:   6 month(s)  Provider:   Sherryl Manges, MD

## 2022-10-12 LAB — COMPREHENSIVE METABOLIC PANEL
ALT: 18 IU/L (ref 0–32)
AST: 23 IU/L (ref 0–40)
Albumin: 4.5 g/dL (ref 3.9–4.9)
Alkaline Phosphatase: 123 IU/L — ABNORMAL HIGH (ref 44–121)
BUN/Creatinine Ratio: 16 (ref 12–28)
BUN: 14 mg/dL (ref 8–27)
Bilirubin Total: 0.3 mg/dL (ref 0.0–1.2)
CO2: 22 mmol/L (ref 20–29)
Calcium: 9.6 mg/dL (ref 8.7–10.3)
Chloride: 102 mmol/L (ref 96–106)
Creatinine, Ser: 0.9 mg/dL (ref 0.57–1.00)
Globulin, Total: 2.6 g/dL (ref 1.5–4.5)
Glucose: 94 mg/dL (ref 70–99)
Potassium: 4 mmol/L (ref 3.5–5.2)
Sodium: 141 mmol/L (ref 134–144)
Total Protein: 7.1 g/dL (ref 6.0–8.5)
eGFR: 71 mL/min/{1.73_m2} (ref >59)

## 2022-10-15 ENCOUNTER — Telehealth: Payer: Self-pay | Admitting: Internal Medicine

## 2022-10-15 NOTE — Telephone Encounter (Signed)
Patient calling in about her EKG report. Please advise

## 2022-10-15 NOTE — Telephone Encounter (Signed)
Spoke with pt who states she has concerns regarding ECG that was completed at her visit on 10/11/2022. ECG reads septal infarct age undetermined.  Pt advised sometimes machine will over read but will forward to Dr Graciela Husbands to ensure he did review ECG during her visit.    Pt is scheduled for echo on 10/17/2022.  Pt reports she continues to have some swelling but is due to be seen in the Lymphedema clinic tomorrow 8/20.  Pt advised Dr Graciela Husbands had recommended a lymphangiogram to determine if lymphedema is the correct diagnosis.  Pt states she will discuss with staff at tomorrow's visit.  Pt requesting refill of Furosemide.  Advised Furosemide 20mg  - 1 tablet daily #30 with 0RF was [facility] to [facility] on 08/15.  Pt verbalizes understanding and thanked Charity fundraiser for the callback.

## 2022-10-16 ENCOUNTER — Ambulatory Visit: Payer: Medicare Other | Admitting: Physical Therapy

## 2022-10-16 NOTE — Telephone Encounter (Signed)
Patient is calling back to talk with Dr. Graciela Husbands or Mindi Junker in regarding to her echo that she is scheduled for tomorrow. Patient said that the lymphedema clinic do not do that kind of test. She is scheduled to see them today. Please call back

## 2022-10-16 NOTE — Telephone Encounter (Signed)
Spoke with pt who states Lymphedema clinic is unable to order Lymphangiogram as recommended by Dr Graciela Husbands.  Pt advised Dr Graciela Husbands is out of the country for 2 weeks.  Encouraged pt to reach out to Dr Fabian Sharp, PCP who referred pt to lymphedema clinic to see if she can refer pt to specialist for further management or order the test herself.    Pt states she is also concerned regarding her recent ECG at her last OV with Dr Graciela Husbands as it states Septal Infarct -age undetermined - pt again advised sometimes the machine will over read ECG and that is why the doctor must review themselves.  Will ask Otilio Saber, PA-C to review ECG dated 10/11/2022.  Pt also concerned as Dr Graciela Husbands advised her during her last OV that she had "decreased blood flow in her neck."  Pt is concerned she needs further testing.  Pt advised will have EP schedulers assist in finding an APP appointment for further evaluation.  Pt verbalizes understanding and agrees with current plan.

## 2022-10-17 ENCOUNTER — Ambulatory Visit (HOSPITAL_COMMUNITY): Payer: Medicare Other | Attending: Internal Medicine

## 2022-10-17 DIAGNOSIS — E785 Hyperlipidemia, unspecified: Secondary | ICD-10-CM | POA: Diagnosis not present

## 2022-10-17 DIAGNOSIS — E119 Type 2 diabetes mellitus without complications: Secondary | ICD-10-CM | POA: Diagnosis not present

## 2022-10-17 DIAGNOSIS — I4719 Other supraventricular tachycardia: Secondary | ICD-10-CM | POA: Diagnosis not present

## 2022-10-17 DIAGNOSIS — I4891 Unspecified atrial fibrillation: Secondary | ICD-10-CM | POA: Diagnosis not present

## 2022-10-17 DIAGNOSIS — I10 Essential (primary) hypertension: Secondary | ICD-10-CM | POA: Insufficient documentation

## 2022-10-17 DIAGNOSIS — R9431 Abnormal electrocardiogram [ECG] [EKG]: Secondary | ICD-10-CM | POA: Diagnosis not present

## 2022-10-17 DIAGNOSIS — C7931 Secondary malignant neoplasm of brain: Secondary | ICD-10-CM | POA: Insufficient documentation

## 2022-10-17 DIAGNOSIS — Z950 Presence of cardiac pacemaker: Secondary | ICD-10-CM | POA: Insufficient documentation

## 2022-10-17 LAB — ECHOCARDIOGRAM COMPLETE
Area-P 1/2: 3.97 cm2
S' Lateral: 2.2 cm

## 2022-10-17 NOTE — Telephone Encounter (Signed)
Called pt Informed of Andy's response on EKG.  Forwarding to [place] to look out for echo result (echo this afternoon) as pt would like to know findings before Dr. Graciela Husbands returns in several weeks. She also would like his advisement on walking. Says she walks daily, but was told to avoid hot/humid days.  She wants to know if ok to walk in the gym on a track.  Informed that I believe this would be fine but will forward to [place] per her request.  She appreciates the return call/information.

## 2022-10-19 NOTE — Telephone Encounter (Signed)
  Pt is calling back to follow up. She said, she was supposed to get a call from [place] yesterday about her echo and also she would like to follow up Lymphangiogram test

## 2022-10-19 NOTE — Telephone Encounter (Signed)
Fernand Parkins, LPN; Valrie Hart, CMA; Alois Cliche, RN; Baird Lyons, RN Caller: Unspecified (4 days ago, 12:19 PM)  Discussed with patient personally.  Echo is normal, EKG is stable for many years.   Given recent history of hip surgery and on-going issues, it is less likely a cardiac issue.    Continue lasix prn, sliding scale.  She has been recommended lymphedema pumps and they have been ordered, she states that it could be up to 6 weeks.   Dr. Eliberto Ivory most recent note states that her L sided symptoms are likely worse in the setting of IT band syndrome and trochanteric bursitis.  Pt also has an Ortho visit 8/28.  I am unfamiliar with lymphangiogram, ie. Best way to order and how to follow up.  If the lymph clinic is "unable to do this" then it is possible this is not the best modality to further assess and would defer to them.  Of note, her PCP placed a referral to Vascular Surgery on 7/31 that is pending. I have asked the patient to follow up on this, as they will most likely have the recommendation for best modality of further assessing her lymphedema if indicated.  Otherwise, follow up with Dr. Graciela Husbands as in recall.

## 2022-10-24 ENCOUNTER — Ambulatory Visit: Payer: Medicare Other | Attending: Orthopaedic Surgery | Admitting: Physical Therapy

## 2022-10-24 DIAGNOSIS — M7632 Iliotibial band syndrome, left leg: Secondary | ICD-10-CM | POA: Diagnosis not present

## 2022-10-24 DIAGNOSIS — M6281 Muscle weakness (generalized): Secondary | ICD-10-CM | POA: Insufficient documentation

## 2022-10-24 DIAGNOSIS — M25552 Pain in left hip: Secondary | ICD-10-CM | POA: Insufficient documentation

## 2022-10-24 DIAGNOSIS — R2681 Unsteadiness on feet: Secondary | ICD-10-CM | POA: Diagnosis not present

## 2022-10-24 NOTE — Patient Instructions (Signed)

## 2022-10-24 NOTE — Therapy (Signed)
OUTPATIENT PHYSICAL THERAPY LOWER EXTREMITY EVALUATION   Patient Name: [name] MRN: [id number] DOB:1955-11-16, 67 y.o., female Today's Date: 10/24/2022  END OF SESSION:  PT End of Session - 10/24/22 1617     Visit Number 1   for L ITband   Date for PT Re-Evaluation 12/19/22    Authorization Type [employer]    Progress Note Due on Visit 10    PT Start Time 1618    PT Stop Time 1700    PT Time Calculation (min) 42 min    Activity Tolerance Patient tolerated treatment well    Behavior During Therapy WFL for tasks assessed/performed             Past Medical History:  Diagnosis Date   Anemia    Angio-edema    Anxiety    Arthritis    Asthma    Atrial fibrillation (HCC)    Atrial tachycardia    Autonomic dysfunction    Complication of anesthesia    pt states wakes up with "shakes"   CVA (cerebral infarction)    2012 with dizziness and vision change felt embolic from atrial tachy   Disorder of bone and cartilage, unspecified    Diverticulosis    DM (diabetes mellitus) (HCC)    DVT (deep venous thrombosis) (HCC)    Eczema    Family history of anesthesia complication    PONV and " shaking "   Fatty liver    GERD (gastroesophageal reflux disease)    History of hiatal hernia    History of radiation therapy 10/24/2012   brain   HLD (hyperlipidemia)    HTN (hypertension)    Hypoglycemia, unspecified    Liver metastasis    Lung metastasis    Metastasis to brain (HCC)    Osteopenia    Other nonspecific abnormal serum enzyme levels    Other specified congenital anomalies of nervous system    Pacemaker    autonomic dysfunction   Pancreatitis    from therapy   Peripheral vascular disease (HCC)    POTS (postural orthostatic tachycardia syndrome)    Raynaud's disease    due to chemo   Skin cancer    Hx: of lung lesion   Past Surgical History:  Procedure Laterality Date   ABLATION SAPHENOUS VEIN W/ RFA     2002 x3   CARPAL TUNNEL RELEASE Left 2000    CHOLECYSTECTOMY N/A 03/28/2018   Procedure: LAPAROSCOPIC CHOLECYSTECTOMY WITH INTRAOPERATIVE CHOLANGIOGRAM;  Surgeon: Luretha Murphy, MD;  Location: [facility] OR;  Service: General;  Laterality: N/A;   CRANIOTOMY N/A 09/30/2012   suboccipital craniectomy   CRANIOTOMY Left 02/09/2013   Procedure: LEFT PARIETAL CRANIOTOMY with stealth;  Surgeon: Barnett Abu, MD;  Location: [facility];  Service: Neurosurgery;  Laterality: Left;  LEFT Parietal Craniotomy for tumor with stealth   DEEP AXILLARY SENTINEL NODE BIOPSY / EXCISION     due to extensive Melanoma-right arm   fatty tumor removed  2000   from chest   INSERT / REPLACE / REMOVE PACEMAKER  2012   1999, x 3   LAPAROSCOPY  09/06/2011   Procedure: LAPAROSCOPY OPERATIVE;  Surgeon: Tresa Endo A. Ernestina Penna, MD;  Location: [facility] ORS;  Service: Gynecology;  Laterality: Left;  with Left Ovarian Cystectomy    MELANOMA EXCISION     with removal of lymph nodes, left shoulder   NOSE SURGERY  2010, 2012   for nose bleeds x 2   SINOSCOPY     TONSILLECTOMY AND ADENOIDECTOMY  TOTAL HIP ARTHROPLASTY Right 05/18/2022   Procedure: RIGHT TOTAL HIP ARTHROPLASTY ANTERIOR APPROACH;  Surgeon: Kathryne Hitch, MD;  Location: [facility] ORS;  Service: Orthopedics;  Laterality: Right;   Patient Active Problem List   Diagnosis Date Noted   Status post total replacement of right hip 05/18/2022   Avascular necrosis of bone of right hip (HCC) 04/19/2022   Moderate persistent asthma 12/08/2018   Perennial allergic rhinitis with predominantly nonallergic component 12/08/2018   Allergic conjunctivitis 12/08/2018   History of epistaxis 12/08/2018   Atopic dermatitis 12/08/2018   Recurrent urticaria 12/08/2018   Skeeter syndrome 12/08/2018   Skin lesion of chest wall 07/22/2018   S/P laparoscopic cholecystectomy 03/28/2018   Dehydration 05/18/2016   Type 2 diabetes mellitus without complication, without long-term current use of insulin (HCC) 11/01/2015   History of pacemaker     POTS (postural orthostatic tachycardia syndrome)    Iron deficiency anemia 11/11/2014   Chronic colitis 08/28/2013   Malignant melanoma, metastatic (HCC) 02/09/2013   Malignant melanoma (HCC) 12/01/2012   Brain metastasis 10/16/2012   Hypertension 09/30/2012   Anxiety state, unspecified 09/30/2012   Hypoxemia 09/30/2012   Disorder of brain 09/08/2012   Dysautonomia (HCC) 02/25/2011   Anticoagulant long-term use 01/19/2011   Neuritis 01/19/2011   Skin change 01/19/2011   CVA (cerebral infarction)    Palpitation 06/28/2010   Pacemaker-Biotronik 06/28/2010   Dizziness and giddiness 04/27/2010   DIVERTICULOSIS, COLON, HX OF 01/19/2009   CAROTID SINUS SYNDROME 11/17/2008   OTHER MALAISE AND FATIGUE 11/17/2008   HYPERTENSION, BENIGN 11/02/2008   ATRIAL TACHYCARDIA 09/16/2008   SYNCOPE, HX OF 08/09/2008   INJURY UNSPEC NERVE SHOULDER GIRDLE&UPPER LIMB 07/12/2008   PERSONAL HISTORY OF MALIGNANT MELANOMA OF SKIN 07/12/2008   LUQ PAIN 12/08/2007   NECK PAIN 09/26/2007   NUMBNESS, ARM 09/26/2007   CPK, ABNORMAL 09/26/2007   ARM PAIN, LEFT 06/25/2007   SWELLING OF LIMB 06/25/2007   OSTEOPENIA 06/25/2007    PCP: Madelin Headings, MD   REFERRING PROVIDER: Kathryne Hitch*  REFERRING DIAG: M57.84 (ICD-10-CM) - It band syndrome, left  THERAPY DIAG:  Pain in left hip  Muscle weakness (generalized)  Unsteadiness on feet  Rationale for Evaluation and Treatment: Rehabilitation  ONSET DATE:  2-3 months (prior to IE)  SUBJECTIVE:   SUBJECTIVE STATEMENT: "I was supposed to come back for my R hip, I went to the lymphedema clinic and started wrapping, then I started swelling on the left side, they stopped wrapping and sent me back, they thought it was my heart, it turned out I had a heart attack at some time.  So I have to go through some more tests while they figure out what is going on.  So I'm swelling a lot so they put me on lasix 20 mg daily, my ankles are swelling now.   I went to [facility] and asked why my left leg is hurting, he did his tests and said it was It Band syndrome sent me back to therapy, said dry needling would help."  Trying to walk 2-3 miles at gym, walks at gym on track.   Not sure what started the pain, does not recall any incident.  Interfering with sleep, likes to sleep on side, not comfortable on other side due to R THA.    PERTINENT HISTORY:  anxiety, asthma, DM-II, HTN, CVA, DVT, POTS, pacemaker, malignant melanoma with brain, liver and lung metastasis s/p radiation and L parietal craniotomy, osteopenia, diverticulitis, GERD, and Raynaud's disease. R THA  05/19/22 PAIN:  Are you having pain? Yes: NPRS scale: 7-8/10 Pain location: L hip pain Pain description: aching, sharp when laying on it Aggravating factors: laying down, sitting prolonged periods Relieving factors: getting up  PRECAUTIONS: ICD/Pacemaker and Other: POTS, advised to stay out of heat, has LE lymphedema  RED FLAGS: None   WEIGHT BEARING RESTRICTIONS: No  FALLS:  Has patient fallen in last 6 months? No  LIVING ENVIRONMENT: Lives with: lives with their spouse Lives in: House/apartment Stairs: Yes: Internal: 13 steps; on right going up, on left going up, and can reach both Has following equipment at home: Single point cane, Walker - 2 wheeled, and shower chair  OCCUPATION: medically disabled due to cancer.   PLOF: Independent walking 2-3 miles, 3-4x/week  PATIENT GOALS: be able to get up from floor, work on balance, decrease L hip pain.    NEXT MD VISIT: not scheduled  OBJECTIVE:   DIAGNOSTIC FINDINGS: 10/08/2022 XR Bilateral Hips An AP pelvis and lateral both hips shows a well-seated right total hip  arthroplasty.  There is no evidence of AVN with the left hip.   PATIENT SURVEYS:  LEFS 46/80  COGNITION: Overall cognitive status: Within functional limits for tasks assessed     SENSATION: Neuropathy bil feet  EDEMA:  History bil LE edema- being treated  at lymphedema clinic  MUSCLE LENGTH: Hamstrings: moderate tightness bilaterally  POSTURE:  weight shift left and leg length discrepancy with R LE longer than L  PALPATION: Tenderness over L glutes, L piriforomis, L greater trochanter, along L Itband.   LOWER EXTREMITY ROM:  Active ROM Right eval Left eval  Hip flexion 140 140  Hip extension    Hip internal rotation 25 25  Hip external rotation 60 60   (Blank rows = not tested)  LOWER EXTREMITY MMT:  MMT Right eval Left eval  Hip flexion 5 5  Hip extension 5 4  Hip abduction 5  5   Hip adduction 5(seated) 5 (seated)  Hip internal rotation    Hip external rotation    Knee flexion 5 5  Knee extension 5 5  Ankle dorsiflexion 5 5  Ankle plantarflexion 5 5   (Blank rows = not tested)  LOWER EXTREMITY SPECIAL TESTS:  Hip special tests: Ober's test: positive for pain  FUNCTIONAL TESTS:  5 times sit to stand: TBD  GAIT: Distance walked: 50 Assistive device utilized: None Level of assistance: Complete Independence Comments: visually slow gait speed.    TODAY'S TREATMENT:                                                                                                                              DATE:   10/24/22 Manual Therapy: to decrease muscle spasm and pain and improve mobility STM/TPR to L glutes, piriformis, skilled palpation and monitoring during dry needling. Trigger Point Dry-Needling  Treatment instructions: Expect mild to moderate muscle soreness. S/S of pneumothorax if dry needled over a  lung field, and to seek immediate medical attention should they occur. Patient verbalized understanding of these instructions and education. Patient Consent Given: Yes Education handout provided: Yes Muscles treated: L glut med, L piriformis Electrical stimulation performed: No Parameters: N/A Treatment response/outcome: Twitch Response Elicited and Palpable Increase in Muscle Length  Self Care: Findings, POC, TrDN, how  compensations for R hip can cause pain in L hip, importance of strengthening, reassurance about current ROM in R hip, discussion of exercise precautions.     PATIENT EDUCATION:  Education details: findings, POC, TrDN  Person educated: Patient Education method: Explanation, Demonstration, and Handouts Education comprehension: verbalized understanding  HOME EXERCISE PROGRAM: TBD  ASSESSMENT:  CLINICAL IMPRESSION: Patient is a 67 y.o. female who was seen today for physical therapy evaluation and treatment for L Itband syndrome.  She has significant medical history including LE lymphedema d/t cancer, recent cardiac event, POTS, and recent R THA.  She demonstrates significant pain and tenderness over L greater trochanter and glutes, and weakness in L hip extensors.  Discussed that she may have been compensating after R THA when walking causing symptoms on Left.  After explanation of DN rational, procedures, outcomes and potential side effects, patient verbalized consent to DN treatment in conjunction with manual STM/DTM and TPR to reduce ttp/muscle tension. Muscles treated as indicated above. DN produced normal response with good twitches elicited resulting in palpable reduction in pain/ttp and muscle tension.  Pt educated to expect mild to moderate muscle soreness for up to 24-48 hrs and instructed to continue prescribed home exercise program and current activity level with pt verbalizing understanding of theses instructions.    Zenovia Jarred Fairhurst would benefit from skilled physical therapy to decrease L hip pain and improve ability to perform ADLs and improve QOL.  She also reports impaired balance and functional strength, and would benefit from strength and balance training as well to decrease risk of falls.   OBJECTIVE IMPAIRMENTS: Abnormal gait, decreased activity tolerance, decreased balance, decreased endurance, decreased mobility, difficulty walking, decreased strength, impaired flexibility, and  pain.   ACTIVITY LIMITATIONS: carrying, lifting, bending, standing, squatting, sleeping, and locomotion level  PARTICIPATION LIMITATIONS: meal prep, cleaning, laundry, and shopping  PERSONAL FACTORS: 3+ comorbidities: anxiety, asthma, DM-II, HTN, CVA, DVT, POTS, pacemaker, malignant melanoma with brain, liver and lung metastasis s/p radiation and L parietal craniotomy, osteopenia, diverticulitis, GERD, and Raynaud's disease. R THA 05/19/22  are also affecting patient's functional outcome.   REHAB POTENTIAL: Good  CLINICAL DECISION MAKING: Evolving/moderate complexity  EVALUATION COMPLEXITY: Moderate   GOALS: Goals reviewed with patient? Yes  SHORT TERM GOALS: Target date: 11/07/2022   Patient will be independent with initial HEP. Baseline: TBD Goal status: INITIAL  2.  Patient will undergo further balance testing Baseline:  Goal status: INITIAL  LONG TERM GOALS: Target date: 12/19/2022   Patient will be independent with advanced/ongoing HEP to improve outcomes and carryover.  Baseline:  Goal status: INITIAL  2.  Patient will report at least 75% improvement in Left hip pain to improve QOL. Baseline:  Goal status: INITIAL  3.  Patient will demonstrate improved functional LE strength as demonstrated by 5x STS <14 seconds to decrease fall risk. Baseline: NT Goal status: INITIAL  4.  Patient will report at least 9 points improvement on LEFS to demonstrate improved functional ability. Baseline: 46/80 Goal status: INITIAL  5.  Patient will demonstrate at least 19/24 on DGI to decrease risk of falls. Baseline: NT Goal status: INITIAL   6.  Patient  will be able to perform floor to stand transfer without use of furniture to assist safely.  Baseline: gets "stuck" when squatting down to pick up item, has to crawl to get back up.  Goal status: INITIAL     PLAN:  PT FREQUENCY: 1-2x/week  PT DURATION: 8 weeks  PLANNED INTERVENTIONS: Therapeutic exercises, Therapeutic  activity, Neuromuscular re-education, Balance training, Gait training, Patient/Family education, Self Care, Joint mobilization, Stair training, Orthotic/Fit training, Dry Needling, Spinal mobilization, Moist heat, Taping, Traction, Manual therapy, and Re-evaluation  PLAN FOR NEXT SESSION: 5x STS, DGI, check and address LLD, continue manual and TrDN, review and progress HEP for LE strengthening and balance.    Jena Gauss, PT,DPT 10/24/2022, 5:37 PM

## 2022-10-26 ENCOUNTER — Other Ambulatory Visit (HOSPITAL_COMMUNITY): Payer: Medicare Other

## 2022-10-30 NOTE — Telephone Encounter (Signed)
[name]

## 2022-11-01 ENCOUNTER — Ambulatory Visit: Payer: Medicare Other | Attending: Orthopaedic Surgery | Admitting: Physical Therapy

## 2022-11-01 ENCOUNTER — Encounter: Payer: Self-pay | Admitting: Physical Therapy

## 2022-11-01 DIAGNOSIS — M6281 Muscle weakness (generalized): Secondary | ICD-10-CM | POA: Diagnosis not present

## 2022-11-01 DIAGNOSIS — R2681 Unsteadiness on feet: Secondary | ICD-10-CM | POA: Insufficient documentation

## 2022-11-01 DIAGNOSIS — M25552 Pain in left hip: Secondary | ICD-10-CM | POA: Insufficient documentation

## 2022-11-01 NOTE — Therapy (Signed)
OUTPATIENT PHYSICAL THERAPY TREATMENT   Patient Name: [name] MRN: [id number] DOB:1955/04/26, 67 y.o., female Today's Date: 11/01/2022  END OF SESSION:  PT End of Session - 11/01/22 1703     Visit Number 2    Date for PT Re-Evaluation 12/19/22    Authorization Type [employer]    Progress Note Due on Visit 10    PT Start Time 1701    PT Stop Time 1754    PT Time Calculation (min) 53 min    Activity Tolerance Patient tolerated treatment well    Behavior During Therapy WFL for tasks assessed/performed             Past Medical History:  Diagnosis Date   Anemia    Angio-edema    Anxiety    Arthritis    Asthma    Atrial fibrillation (HCC)    Atrial tachycardia    Autonomic dysfunction    Complication of anesthesia    pt states wakes up with "shakes"   CVA (cerebral infarction)    2012 with dizziness and vision change felt embolic from atrial tachy   Disorder of bone and cartilage, unspecified    Diverticulosis    DM (diabetes mellitus) (HCC)    DVT (deep venous thrombosis) (HCC)    Eczema    Family history of anesthesia complication    PONV and " shaking "   Fatty liver    GERD (gastroesophageal reflux disease)    History of hiatal hernia    History of radiation therapy 10/24/2012   brain   HLD (hyperlipidemia)    HTN (hypertension)    Hypoglycemia, unspecified    Liver metastasis    Lung metastasis    Metastasis to brain (HCC)    Osteopenia    Other nonspecific abnormal serum enzyme levels    Other specified congenital anomalies of nervous system    Pacemaker    autonomic dysfunction   Pancreatitis    from therapy   Peripheral vascular disease (HCC)    POTS (postural orthostatic tachycardia syndrome)    Raynaud's disease    due to chemo   Skin cancer    Hx: of lung lesion   Past Surgical History:  Procedure Laterality Date   ABLATION SAPHENOUS VEIN W/ RFA     2002 x3   CARPAL TUNNEL RELEASE Left 2000   CHOLECYSTECTOMY N/A 03/28/2018    Procedure: LAPAROSCOPIC CHOLECYSTECTOMY WITH INTRAOPERATIVE CHOLANGIOGRAM;  Surgeon: Luretha Murphy, MD;  Location: [facility] OR;  Service: General;  Laterality: N/A;   CRANIOTOMY N/A 09/30/2012   suboccipital craniectomy   CRANIOTOMY Left 02/09/2013   Procedure: LEFT PARIETAL CRANIOTOMY with stealth;  Surgeon: Barnett Abu, MD;  Location: [facility];  Service: Neurosurgery;  Laterality: Left;  LEFT Parietal Craniotomy for tumor with stealth   DEEP AXILLARY SENTINEL NODE BIOPSY / EXCISION     due to extensive Melanoma-right arm   fatty tumor removed  2000   from chest   INSERT / REPLACE / REMOVE PACEMAKER  2012   1999, x 3   LAPAROSCOPY  09/06/2011   Procedure: LAPAROSCOPY OPERATIVE;  Surgeon: Tresa Endo A. Ernestina Penna, MD;  Location: [facility];  Service: Gynecology;  Laterality: Left;  with Left Ovarian Cystectomy    MELANOMA EXCISION     with removal of lymph nodes, left shoulder   NOSE SURGERY  2010, 2012   for nose bleeds x 2   SINOSCOPY     TONSILLECTOMY AND ADENOIDECTOMY     TOTAL HIP  ARTHROPLASTY Right 05/18/2022   Procedure: RIGHT TOTAL HIP ARTHROPLASTY ANTERIOR APPROACH;  Surgeon: Kathryne Hitch, MD;  Location: [facility] OR[facility];  Service: Orthopedics;  Laterality: Right;   Patient Active Problem List   Diagnosis Date Noted   Status post total replacement of right hip 05/18/2022   Avascular necrosis of bone of right hip (HCC) 04/19/2022   Moderate persistent asthma 12/08/2018   Perennial allergic rhinitis with predominantly nonallergic component 12/08/2018   Allergic conjunctivitis 12/08/2018   History of epistaxis 12/08/2018   Atopic dermatitis 12/08/2018   Recurrent urticaria 12/08/2018   Skeeter syndrome 12/08/2018   Skin lesion of chest wall 07/22/2018   S/P laparoscopic cholecystectomy 03/28/2018   Dehydration 05/18/2016   Type 2 diabetes mellitus without complication, without long-term current use of insulin (HCC) 11/01/2015   History of pacemaker    POTS (postural orthostatic  tachycardia syndrome)    Iron deficiency anemia 11/11/2014   Chronic colitis 08/28/2013   Malignant melanoma, metastatic (HCC) 02/09/2013   Malignant melanoma (HCC) 12/01/2012   Brain metastasis 10/16/2012   Hypertension 09/30/2012   Anxiety state, unspecified 09/30/2012   Hypoxemia 09/30/2012   Disorder of brain 09/08/2012   Dysautonomia (HCC) 02/25/2011   Anticoagulant long-term use 01/19/2011   Neuritis 01/19/2011   Skin change 01/19/2011   CVA (cerebral infarction)    Palpitation 06/28/2010   Pacemaker-Biotronik 06/28/2010   Dizziness and giddiness 04/27/2010   DIVERTICULOSIS, COLON, HX OF 01/19/2009   CAROTID SINUS SYNDROME 11/17/2008   OTHER MALAISE AND FATIGUE 11/17/2008   HYPERTENSION, BENIGN 11/02/2008   ATRIAL TACHYCARDIA 09/16/2008   SYNCOPE, HX OF 08/09/2008   INJURY UNSPEC NERVE SHOULDER GIRDLE&UPPER LIMB 07/12/2008   PERSONAL HISTORY OF MALIGNANT MELANOMA OF SKIN 07/12/2008   LUQ PAIN 12/08/2007   NECK PAIN 09/26/2007   NUMBNESS, ARM 09/26/2007   CPK, ABNORMAL 09/26/2007   ARM PAIN, LEFT 06/25/2007   SWELLING OF LIMB 06/25/2007   OSTEOPENIA 06/25/2007    PCP: Madelin Headings, MD   REFERRING PROVIDER: Kathryne Hitch*  REFERRING DIAG: Z61.09 (ICD-10-CM) - It band syndrome, left  THERAPY DIAG:  Pain in left hip  Muscle weakness (generalized)  Unsteadiness on feet  Rationale for Evaluation and Treatment: Rehabilitation  ONSET DATE:  2-3 months (prior to IE)  SUBJECTIVE:   SUBJECTIVE STATEMENT: I didn't like the dry needling, was sore after, but I do think it helped.  L leg still giving me trouble.   PERTINENT HISTORY:  anxiety, asthma, DM-II, HTN, CVA, DVT, POTS, pacemaker, malignant melanoma with brain, liver and lung metastasis s/p radiation and L parietal craniotomy, osteopenia, diverticulitis, GERD, and Raynaud's disease. R THA 05/19/22 PAIN:  Are you having pain? Yes: NPRS scale: 7/10 Pain location: L hip pain Pain description:  aching, sharp when laying on it Aggravating factors: laying down, sitting prolonged periods Relieving factors: getting up  PRECAUTIONS: ICD/Pacemaker and Other: POTS, advised to stay out of heat, has LE lymphedema  RED FLAGS: None   WEIGHT BEARING RESTRICTIONS: No  FALLS:  Has patient fallen in last 6 months? No  LIVING ENVIRONMENT: Lives with: lives with their spouse Lives in: House/apartment Stairs: Yes: Internal: 13 steps; on right going up, on left going up, and can reach both Has following equipment at home: Single point cane, Walker - 2 wheeled, and shower chair  OCCUPATION: medically disabled due to cancer.   PLOF: Independent walking 2-3 miles, 3-4x/week  PATIENT GOALS: be able to get up from floor, work on balance, decrease L hip pain.  NEXT MD VISIT: not scheduled  OBJECTIVE:   DIAGNOSTIC FINDINGS: 10/08/2022 XR Bilateral Hips An AP pelvis and lateral both hips shows a well-seated right total hip  arthroplasty.  There is no evidence of AVN with the left hip.   PATIENT SURVEYS:  LEFS 46/80  COGNITION: Overall cognitive status: Within functional limits for tasks assessed     SENSATION: Neuropathy bil feet  EDEMA:  History bil LE edema- being treated at lymphedema clinic  MUSCLE LENGTH: Hamstrings: moderate tightness bilaterally  POSTURE:  weight shift left and leg length discrepancy with R LE longer than L  PALPATION: Tenderness over L glutes, L piriforomis, L greater trochanter, along L Itband.   LOWER EXTREMITY ROM:  Active ROM Right eval Left eval  Hip flexion 140 140  Hip extension    Hip internal rotation 25 25  Hip external rotation 60 60   (Blank rows = not tested)  LOWER EXTREMITY MMT:  MMT Right eval Left eval  Hip flexion 5 5  Hip extension 5 4  Hip abduction 5  5   Hip adduction 5(seated) 5 (seated)  Hip internal rotation    Hip external rotation    Knee flexion 5 5  Knee extension 5 5  Ankle dorsiflexion 5 5  Ankle  plantarflexion 5 5   (Blank rows = not tested)  LOWER EXTREMITY SPECIAL TESTS:  Hip special tests: Ober's test: positive for pain  FUNCTIONAL TESTS:  5 times sit to stand: 14.5 sec  DGI - 24/24  GAIT: Distance walked: 50 Assistive device utilized: None Level of assistance: Complete Independence Comments: visually slow gait speed.    [facility] PT Assessment - 11/01/22 0001       Balance   Balance Assessed Yes      Standardized Balance Assessment   Standardized Balance Assessment Dynamic Gait Index      Dynamic Gait Index   Level Surface Normal    Change in Gait Speed Normal    Gait with Horizontal Head Turns Normal    Gait with Vertical Head Turns Normal    Gait and Pivot Turn Normal    Step Over Obstacle Normal    Step Around Obstacles Normal    Steps Normal    Total Score 24             TODAY'S TREATMENT:                                                                                                                              DATE:   11/01/2022 Therapeutic Exercise: to improve strength and mobility.  Demo, verbal and tactile cues throughout for technique. Nustep L4 x 5 min  Supine ITB Stretch  Supine Hip Internal and External Rotation  Supine Bridge Supine bridges with pillow squeeze Prone hip extension x 10  Prone Hip Extension with Bent Knee  ITB Stretch at [employer]  Standing Hip Flexor Stretch  Therapeutic Activity:   DGI 5xSTS Self  Care: Assessing LLD and discussing different options.    10/24/22 Manual Therapy: to decrease muscle spasm and pain and improve mobility STM/TPR to L glutes, piriformis, skilled palpation and monitoring during dry needling. Trigger Point Dry-Needling  Treatment instructions: Expect mild to moderate muscle soreness. S/S of pneumothorax if dry needled over a lung field, and to seek immediate medical attention should they occur. Patient verbalized understanding of these instructions and education. Patient Consent Given:  Yes Education handout provided: Yes Muscles treated: L glut med, L piriformis Electrical stimulation performed: No Parameters: N/A Treatment response/outcome: Twitch Response Elicited and Palpable Increase in Muscle Length  Self Care: Findings, POC, TrDN, how compensations for R hip can cause pain in L hip, importance of strengthening, reassurance about current ROM in R hip, discussion of exercise precautions.     PATIENT EDUCATION:  Education details:   Person educated: Patient Education method: Explanation, Facilities manager, and Handouts Education comprehension: verbalized understanding  HOME EXERCISE PROGRAM: Access Code: [email or web address] URL: [email or web address] Date: 11/01/2022 Prepared by: Harrie Foreman  Exercises - Supine ITB Stretch  - 1 x daily - 7 x weekly - 1 sets - 3 reps - 30 sec  hold - Supine Hip Internal and External Rotation  - 1 x daily - 7 x weekly - 2 sets - 10 reps - Supine Bridge with Mini Swiss Ball Between Knees  - 1 x daily - 7 x weekly - 2 sets - 10 reps - Prone Hip Extension with Bent Knee  - 1 x daily - 7 x weekly - 2 sets - 10 reps - ITB Stretch at Wall  - 1 x daily - 7 x weekly - 1 sets - 3 reps - 30 sec hold - Standing Hip Flexor Stretch  - 1 x daily - 7 x weekly - 1 sets - 3 reps - 30 sec hold  ASSESSMENT:  CLINICAL IMPRESSION: [name] completed DGI today scoring 24/24, able to walk up and down stairs with reciprocal step and no hand rail.  She does have 2cm leg length discrepancy (right is longer) which she has known about since surgery, and does have extra insert in left shoe, discussed different options for accommodating, but she was not interested because she has a lot of different shoes and does not want to have cobbler adjust any due to cost.  Also not interested in changing main exercise (walking 2-3 miles/day) although walking with this LLD may be main cause of her ITband syndrome.  We did discuss that this may be a recurrent  problem as a result.  Given HEP today for hip strengthening and ITBand and hip flexor stretching. [name] continues to demonstrate potential for improvement and would benefit from continued skilled therapy to address impairments.     OBJECTIVE IMPAIRMENTS: Abnormal gait, decreased activity tolerance, decreased balance, decreased endurance, decreased mobility, difficulty walking, decreased strength, impaired flexibility, and pain.   ACTIVITY LIMITATIONS: carrying, lifting, bending, standing, squatting, sleeping, and locomotion level  PARTICIPATION LIMITATIONS: meal prep, cleaning, laundry, and shopping  PERSONAL FACTORS: 3+ comorbidities: anxiety, asthma, DM-II, HTN, CVA, DVT, POTS, pacemaker, malignant melanoma with brain, liver and lung metastasis s/p radiation and L parietal craniotomy, osteopenia, diverticulitis, GERD, and Raynaud's disease. R THA 05/19/22  are also affecting patient's functional outcome.   REHAB POTENTIAL: Good  CLINICAL DECISION MAKING: Evolving/moderate complexity  EVALUATION COMPLEXITY: Moderate   GOALS: Goals reviewed with patient? Yes  SHORT TERM GOALS: Target date: 11/07/2022   Patient will be independent with initial  HEP. Baseline: given 11/01/22 Goal status: IN PROGRESS  2.  Patient will undergo further balance testing Baseline:  Goal status: MET 11/01/22 - competed DGI  LONG TERM GOALS: Target date: 12/19/2022   Patient will be independent with advanced/ongoing HEP to improve outcomes and carryover.  Baseline:  Goal status: IN PROGRESS  2.  Patient will report at least 75% improvement in Left hip pain to improve QOL. Baseline:  Goal status: IN PROGRESS  3.  Patient will demonstrate improved functional LE strength as demonstrated by 5x STS <14 seconds to decrease fall risk. Baseline: NT Goal status: IN PROGRESS  4.  Patient will report at least 9 points improvement on LEFS to demonstrate improved functional ability. Baseline: 46/80 Goal  status: IN PROGRESS  5.  Patient will demonstrate at least 19/24 on DGI to decrease risk of falls. Baseline: NT Goal status: MET 11/01/22- 24/24  6.  Patient will be able to perform floor to stand transfer without use of furniture to assist safely.  Baseline: gets "stuck" when squatting down to pick up item, has to crawl to get back up.  Goal status: IN PROGRESS     PLAN:  PT FREQUENCY: 1-2x/week  PT DURATION: 8 weeks  PLANNED INTERVENTIONS: Therapeutic exercises, Therapeutic activity, Neuromuscular re-education, Balance training, Gait training, Patient/Family education, Self Care, Joint mobilization, Stair training, Orthotic/Fit training, Dry Needling, Spinal mobilization, Moist heat, Taping, Traction, Manual therapy, and Re-evaluation  PLAN FOR NEXT SESSION: continue manual and TrDN, review and progress HEP for LE strengthening and balance.  Floor to stand   Jena Gauss, PT, DPT 11/01/2022, 6:30 PM

## 2022-11-02 ENCOUNTER — Other Ambulatory Visit: Payer: Self-pay | Admitting: Internal Medicine

## 2022-11-07 ENCOUNTER — Encounter: Payer: Self-pay | Admitting: Physical Therapy

## 2022-11-07 ENCOUNTER — Ambulatory Visit: Payer: Medicare Other | Admitting: Physical Therapy

## 2022-11-07 DIAGNOSIS — R2681 Unsteadiness on feet: Secondary | ICD-10-CM

## 2022-11-07 DIAGNOSIS — M25552 Pain in left hip: Secondary | ICD-10-CM

## 2022-11-07 DIAGNOSIS — M6281 Muscle weakness (generalized): Secondary | ICD-10-CM

## 2022-11-07 NOTE — Therapy (Signed)
OUTPATIENT PHYSICAL THERAPY TREATMENT   Patient Name: [name] MRN: [id number] DOB:1955/09/08, 67 y.o., female Today's Date: 11/07/2022  END OF SESSION:  PT End of Session - 11/07/22 1101     Visit Number 3    Date for PT Re-Evaluation 12/19/22    Authorization Type [employer]    Progress Note Due on Visit 10    PT Start Time 1101    PT Stop Time 1147    PT Time Calculation (min) 46 min    Activity Tolerance Patient tolerated treatment well    Behavior During Therapy WFL for tasks assessed/performed             Past Medical History:  Diagnosis Date   Anemia    Angio-edema    Anxiety    Arthritis    Asthma    Atrial fibrillation (HCC)    Atrial tachycardia    Autonomic dysfunction    Complication of anesthesia    pt states wakes up with "shakes"   CVA (cerebral infarction)    2012 with dizziness and vision change felt embolic from atrial tachy   Disorder of bone and cartilage, unspecified    Diverticulosis    DM (diabetes mellitus) (HCC)    DVT (deep venous thrombosis) (HCC)    Eczema    Family history of anesthesia complication    PONV and " shaking "   Fatty liver    GERD (gastroesophageal reflux disease)    History of hiatal hernia    History of radiation therapy 10/24/2012   brain   HLD (hyperlipidemia)    HTN (hypertension)    Hypoglycemia, unspecified    Liver metastasis    Lung metastasis    Metastasis to brain (HCC)    Osteopenia    Other nonspecific abnormal serum enzyme levels    Other specified congenital anomalies of nervous system    Pacemaker    autonomic dysfunction   Pancreatitis    from therapy   Peripheral vascular disease (HCC)    POTS (postural orthostatic tachycardia syndrome)    Raynaud's disease    due to chemo   Skin cancer    Hx: of lung lesion   Past Surgical History:  Procedure Laterality Date   ABLATION SAPHENOUS VEIN W/ RFA     2002 x3   CARPAL TUNNEL RELEASE Left 2000   CHOLECYSTECTOMY N/A 03/28/2018    Procedure: LAPAROSCOPIC CHOLECYSTECTOMY WITH INTRAOPERATIVE CHOLANGIOGRAM;  Surgeon: Luretha Murphy, MD;  Location: [facility] OR;  Service: General;  Laterality: N/A;   CRANIOTOMY N/A 09/30/2012   suboccipital craniectomy   CRANIOTOMY Left 02/09/2013   Procedure: LEFT PARIETAL CRANIOTOMY with stealth;  Surgeon: Barnett Abu, MD;  Location: [facility];  Service: Neurosurgery;  Laterality: Left;  LEFT Parietal Craniotomy for tumor with stealth   DEEP AXILLARY SENTINEL NODE BIOPSY / EXCISION     due to extensive Melanoma-right arm   fatty tumor removed  2000   from chest   INSERT / REPLACE / REMOVE PACEMAKER  2012   1999, x 3   LAPAROSCOPY  09/06/2011   Procedure: LAPAROSCOPY OPERATIVE;  Surgeon: Tresa Endo A. Ernestina Penna, MD;  Location: [facility] ORS;  Service: Gynecology;  Laterality: Left;  with Left Ovarian Cystectomy    MELANOMA EXCISION     with removal of lymph nodes, left shoulder   NOSE SURGERY  2010, 2012   for nose bleeds x 2   SINOSCOPY     TONSILLECTOMY AND ADENOIDECTOMY     TOTAL HIP  ARTHROPLASTY Right 05/18/2022   Procedure: RIGHT TOTAL HIP ARTHROPLASTY ANTERIOR APPROACH;  Surgeon: Kathryne Hitch, MD;  Location: [facility] ORS;  Service: Orthopedics;  Laterality: Right;   Patient Active Problem List   Diagnosis Date Noted   Status post total replacement of right hip 05/18/2022   Avascular necrosis of bone of right hip (HCC) 04/19/2022   Moderate persistent asthma 12/08/2018   Perennial allergic rhinitis with predominantly nonallergic component 12/08/2018   Allergic conjunctivitis 12/08/2018   History of epistaxis 12/08/2018   Atopic dermatitis 12/08/2018   Recurrent urticaria 12/08/2018   Skeeter syndrome 12/08/2018   Skin lesion of chest wall 07/22/2018   S/P laparoscopic cholecystectomy 03/28/2018   Dehydration 05/18/2016   Type 2 diabetes mellitus without complication, without long-term current use of insulin (HCC) 11/01/2015   History of pacemaker    POTS (postural orthostatic  tachycardia syndrome)    Iron deficiency anemia 11/11/2014   Chronic colitis 08/28/2013   Malignant melanoma, metastatic (HCC) 02/09/2013   Malignant melanoma (HCC) 12/01/2012   Brain metastasis 10/16/2012   Hypertension 09/30/2012   Anxiety state, unspecified 09/30/2012   Hypoxemia 09/30/2012   Disorder of brain 09/08/2012   Dysautonomia (HCC) 02/25/2011   Anticoagulant long-term use 01/19/2011   Neuritis 01/19/2011   Skin change 01/19/2011   CVA (cerebral infarction)    Palpitation 06/28/2010   Pacemaker-Biotronik 06/28/2010   Dizziness and giddiness 04/27/2010   DIVERTICULOSIS, COLON, HX OF 01/19/2009   CAROTID SINUS SYNDROME 11/17/2008   OTHER MALAISE AND FATIGUE 11/17/2008   HYPERTENSION, BENIGN 11/02/2008   ATRIAL TACHYCARDIA 09/16/2008   SYNCOPE, HX OF 08/09/2008   INJURY UNSPEC NERVE SHOULDER GIRDLE&UPPER LIMB 07/12/2008   PERSONAL HISTORY OF MALIGNANT MELANOMA OF SKIN 07/12/2008   LUQ PAIN 12/08/2007   NECK PAIN 09/26/2007   NUMBNESS, ARM 09/26/2007   CPK, ABNORMAL 09/26/2007   ARM PAIN, LEFT 06/25/2007   SWELLING OF LIMB 06/25/2007   OSTEOPENIA 06/25/2007    PCP: Madelin Headings, MD   REFERRING PROVIDER: Kathryne Hitch*  REFERRING DIAG: Z61.09 (ICD-10-CM) - It band syndrome, left  THERAPY DIAG:  Pain in left hip  Muscle weakness (generalized)  Unsteadiness on feet  Rationale for Evaluation and Treatment: Rehabilitation  ONSET DATE:  2-3 months (prior to IE)  SUBJECTIVE:   SUBJECTIVE STATEMENT: Really hurting today, walked 4 miles on Sunday and yesterday had to wear regular shoes to meeting and walking uneven really aggravated pain.  Called to try to find cobbler without success, called [facility] as was quoted $1600.   PERTINENT HISTORY:  anxiety, asthma, DM-II, HTN, CVA, DVT, POTS, pacemaker, malignant melanoma with brain, liver and lung metastasis s/p radiation and L parietal craniotomy, osteopenia, diverticulitis, GERD, and Raynaud's  disease. R THA 05/19/22 PAIN:  Are you having pain? Yes: NPRS scale: 7/10 Pain location: L hip pain Pain description: aching, sharp when laying on it Aggravating factors: laying down, sitting prolonged periods Relieving factors: getting up  PRECAUTIONS: ICD/Pacemaker and Other: POTS, advised to stay out of heat, has LE lymphedema  RED FLAGS: None   WEIGHT BEARING RESTRICTIONS: No  FALLS:  Has patient fallen in last 6 months? No  LIVING ENVIRONMENT: Lives with: lives with their spouse Lives in: House/apartment Stairs: Yes: Internal: 13 steps; on right going up, on left going up, and can reach both Has following equipment at home: Single point cane, Walker - 2 wheeled, and shower chair  OCCUPATION: medically disabled due to cancer.   PLOF: Independent walking 2-3 miles, 3-4x/week  PATIENT GOALS: be able to get up from floor, work on balance, decrease L hip pain.    NEXT MD VISIT: not scheduled  OBJECTIVE:   DIAGNOSTIC FINDINGS: 10/08/2022 XR Bilateral Hips An AP pelvis and lateral both hips shows a well-seated right total hip  arthroplasty.  There is no evidence of AVN with the left hip.   PATIENT SURVEYS:  LEFS 46/80  COGNITION: Overall cognitive status: Within functional limits for tasks assessed     SENSATION: Neuropathy bil feet  EDEMA:  History bil LE edema- being treated at lymphedema clinic  MUSCLE LENGTH: Hamstrings: moderate tightness bilaterally  POSTURE:  weight shift left and leg length discrepancy with R LE longer than L  PALPATION: Tenderness over L glutes, L piriforomis, L greater trochanter, along L Itband.   LOWER EXTREMITY ROM:  Active ROM Right eval Left eval  Hip flexion 140 140  Hip extension    Hip internal rotation 25 25  Hip external rotation 60 60   (Blank rows = not tested)  LOWER EXTREMITY MMT:  MMT Right eval Left eval  Hip flexion 5 5  Hip extension 5 4  Hip abduction 5  5   Hip adduction 5(seated) 5 (seated)  Hip  internal rotation    Hip external rotation    Knee flexion 5 5  Knee extension 5 5  Ankle dorsiflexion 5 5  Ankle plantarflexion 5 5   (Blank rows = not tested)  LOWER EXTREMITY SPECIAL TESTS:  Hip special tests: Ober's test: positive for pain  FUNCTIONAL TESTS:  5 times sit to stand: 14.5 sec  DGI - 24/24  GAIT: Distance walked: 50 Assistive device utilized: None Level of assistance: Complete Independence Comments: visually slow gait speed.      TODAY'S TREATMENT:                                                                                                                              DATE:  11/07/22 Therapeutic Exercise: to improve strength and mobility.  Demo, verbal and tactile cues throughout for technique. Bike x 6 min  Squats 2 x 5 - noted rapid fatigue, with table touch Lunges - counter for support.  Manual Therapy: to decrease muscle spasm and pain and improve mobility IASTM with foam roller to bil glutes, proximal hamstrings, STM/TPR to L piriformis and L glute med, skilled palpation and monitoring during dry needling. Trigger Point Dry-Needling  Treatment instructions: Expect mild to moderate muscle soreness. S/S of pneumothorax if dry needled over a lung field, and to seek immediate medical attention should they occur. Patient verbalized understanding of these instructions and education. Patient Consent Given: Yes Education handout provided: Previously provided Muscles treated: L glut med, L piriformis Electrical stimulation performed: No Parameters: N/A Treatment response/outcome: Twitch Response Elicited and Palpable Increase in Muscle Length   11/01/2022 Therapeutic Exercise: to improve strength and mobility.  Demo, verbal and tactile cues throughout for technique. Nustep L4 x 5  min  Supine ITB Stretch  Supine Hip Internal and External Rotation  Supine Bridge Supine bridges with pillow squeeze Prone hip extension x 10  Prone Hip Extension with Bent Knee   ITB Stretch at Wall  Standing Hip Flexor Stretch  Therapeutic Activity:   DGI 5xSTS Self Care: Assessing LLD and discussing different options.    10/24/22 Manual Therapy: to decrease muscle spasm and pain and improve mobility STM/TPR to L glutes, piriformis, skilled palpation and monitoring during dry needling. Trigger Point Dry-Needling  Treatment instructions: Expect mild to moderate muscle soreness. S/S of pneumothorax if dry needled over a lung field, and to seek immediate medical attention should they occur. Patient verbalized understanding of these instructions and education. Patient Consent Given: Yes Education handout provided: Yes Muscles treated: L glut med, L piriformis Electrical stimulation performed: No Parameters: N/A Treatment response/outcome: Twitch Response Elicited and Palpable Increase in Muscle Length  Self Care: Findings, POC, TrDN, how compensations for R hip can cause pain in L hip, importance of strengthening, reassurance about current ROM in R hip, discussion of exercise precautions.     PATIENT EDUCATION:  Education details:  HEP update Person educated: Patient Education method: Explanation, Demonstration, and Handouts Education comprehension: verbalized understanding  HOME EXERCISE PROGRAM: Access Code: [email or web address] URL: [email or web address] Date: 11/07/2022 Prepared by: Harrie Foreman  Exercises - Supine ITB Stretch  - 1 x daily - 7 x weekly - 1 sets - 3 reps - 30 sec  hold - Supine Hip Internal and External Rotation  - 1 x daily - 7 x weekly - 2 sets - 10 reps - Supine Bridge with Mini Swiss Ball Between Knees  - 1 x daily - 7 x weekly - 2 sets - 10 reps - Prone Hip Extension with Bent Knee  - 1 x daily - 7 x weekly - 2 sets - 10 reps - ITB Stretch at Wall  - 1 x daily - 7 x weekly - 1 sets - 3 reps - 30 sec hold - Standing Hip Flexor Stretch  - 1 x daily - 7 x weekly - 1 sets - 3 reps - 30 sec hold - Squat with Chair Touch  - 3 x  daily - 7 x weekly - 2 sets - 5 reps - Lunge with Counter Support  - 1 x daily - 7 x weekly - 2-3 sets - 10 reps  ASSESSMENT:  CLINICAL IMPRESSION: [name] reported increased L hip pain today after walking 4 miles then walking with out heel lift in shoe.  She has been unsuccessful finding a cobbler.   Today looked at her floor to stand transfers through half kneel, gave lunges to build functional strength, also noted rapid fatigue with squats and added chair touch squats as well.  Manual therapy to both hips followed by TrDN to L piriformis and glut med with good twitch response in both muscles.  [name] continues to demonstrate potential for improvement and would benefit from continued skilled therapy to address impairments.     OBJECTIVE IMPAIRMENTS: Abnormal gait, decreased activity tolerance, decreased balance, decreased endurance, decreased mobility, difficulty walking, decreased strength, impaired flexibility, and pain.   ACTIVITY LIMITATIONS: carrying, lifting, bending, standing, squatting, sleeping, and locomotion level  PARTICIPATION LIMITATIONS: meal prep, cleaning, laundry, and shopping  PERSONAL FACTORS: 3+ comorbidities: anxiety, asthma, DM-II, HTN, CVA, DVT, POTS, pacemaker, malignant melanoma with brain, liver and lung metastasis s/p radiation and L parietal craniotomy, osteopenia, diverticulitis, GERD, and Raynaud's disease. R THA  05/19/22  are also affecting patient's functional outcome.   REHAB POTENTIAL: Good  CLINICAL DECISION MAKING: Evolving/moderate complexity  EVALUATION COMPLEXITY: Moderate   GOALS: Goals reviewed with patient? Yes  SHORT TERM GOALS: Target date: 11/07/2022   Patient will be independent with initial HEP. Baseline: given 11/01/22 Goal status: IN PROGRESS  2.  Patient will undergo further balance testing Baseline:  Goal status: MET 11/01/22 - competed DGI  LONG TERM GOALS: Target date: 12/19/2022   Patient will be independent  with advanced/ongoing HEP to improve outcomes and carryover.  Baseline:  Goal status: IN PROGRESS  2.  Patient will report at least 75% improvement in Left hip pain to improve QOL. Baseline:  Goal status: IN PROGRESS  3.  Patient will demonstrate improved functional LE strength as demonstrated by 5x STS <14 seconds to decrease fall risk. Baseline: NT Goal status: IN PROGRESS  4.  Patient will report at least 9 points improvement on LEFS to demonstrate improved functional ability. Baseline: 46/80 Goal status: IN PROGRESS  5.  Patient will demonstrate at least 19/24 on DGI to decrease risk of falls. Baseline: NT Goal status: MET 11/01/22- 24/24  6.  Patient will be able to perform floor to stand transfer without use of furniture to assist safely.  Baseline: gets "stuck" when squatting down to pick up item, has to crawl to get back up.  Goal status: IN PROGRESS     PLAN:  PT FREQUENCY: 1-2x/week  PT DURATION: 8 weeks  PLANNED INTERVENTIONS: Therapeutic exercises, Therapeutic activity, Neuromuscular re-education, Balance training, Gait training, Patient/Family education, Self Care, Joint mobilization, Stair training, Orthotic/Fit training, Dry Needling, Spinal mobilization, Moist heat, Taping, Traction, Manual therapy, and Re-evaluation  PLAN FOR NEXT SESSION: continue manual and TrDN, review and progress HEP for LE strengthening and balance.    Jena Gauss, PT, DPT 11/07/2022, 12:05 PM

## 2022-11-08 ENCOUNTER — Other Ambulatory Visit: Payer: Self-pay | Admitting: Internal Medicine

## 2022-11-08 ENCOUNTER — Encounter: Payer: Medicare Other | Admitting: Internal Medicine

## 2022-11-12 ENCOUNTER — Telehealth: Payer: Self-pay | Admitting: *Deleted

## 2022-11-12 ENCOUNTER — Ambulatory Visit: Payer: Medicare Other | Admitting: Physical Therapy

## 2022-11-12 ENCOUNTER — Encounter: Payer: Self-pay | Admitting: Physical Therapy

## 2022-11-12 DIAGNOSIS — M25552 Pain in left hip: Secondary | ICD-10-CM

## 2022-11-12 DIAGNOSIS — M6281 Muscle weakness (generalized): Secondary | ICD-10-CM | POA: Diagnosis not present

## 2022-11-12 DIAGNOSIS — R2681 Unsteadiness on feet: Secondary | ICD-10-CM | POA: Diagnosis not present

## 2022-11-12 NOTE — Therapy (Signed)
OUTPATIENT PHYSICAL THERAPY TREATMENT   Patient Name: [name] MRN: [id number] DOB:[birth year], 67 y.o., female Today's Date: 11/12/2022  END OF SESSION:  PT End of Session - 11/12/22 1359     Visit Number 4    Date for PT Re-Evaluation 12/19/22    Authorization Type [employer]    Progress Note Due on Visit 10    PT Start Time 1359    PT Stop Time 1445    PT Time Calculation (min) 46 min    Activity Tolerance Patient tolerated treatment well    Behavior During Therapy WFL for tasks assessed/performed             Past Medical History:  Diagnosis Date   Anemia    Angio-edema    Anxiety    Arthritis    Asthma    Atrial fibrillation (HCC)    Atrial tachycardia    Autonomic dysfunction    Complication of anesthesia    pt states wakes up with "shakes"   CVA (cerebral infarction)    2012 with dizziness and vision change felt embolic from atrial tachy   Disorder of bone and cartilage, unspecified    Diverticulosis    DM (diabetes mellitus) (HCC)    DVT (deep venous thrombosis) (HCC)    Eczema    Family history of anesthesia complication    PONV and " shaking "   Fatty liver    GERD (gastroesophageal reflux disease)    History of hiatal hernia    History of radiation therapy 10/24/2012   brain   HLD (hyperlipidemia)    HTN (hypertension)    Hypoglycemia, unspecified    Liver metastasis    Lung metastasis    Metastasis to brain (HCC)    Osteopenia    Other nonspecific abnormal serum enzyme levels    Other specified congenital anomalies of nervous system    Pacemaker    autonomic dysfunction   Pancreatitis    from therapy   Peripheral vascular disease (HCC)    POTS (postural orthostatic tachycardia syndrome)    Raynaud's disease    due to chemo   Skin cancer    Hx: of lung lesion   Past Surgical History:  Procedure Laterality Date   ABLATION SAPHENOUS VEIN W/ RFA     2002 x3   CARPAL TUNNEL RELEASE Left 2000   CHOLECYSTECTOMY N/A 03/28/2018    Procedure: LAPAROSCOPIC CHOLECYSTECTOMY WITH INTRAOPERATIVE CHOLANGIOGRAM;  Surgeon: Luretha Murphy, MD;  Location: [facility] OR;  Service: General;  Laterality: N/A;   CRANIOTOMY N/A 09/30/2012   suboccipital craniectomy   CRANIOTOMY Left 02/09/2013   Procedure: LEFT PARIETAL CRANIOTOMY with stealth;  Surgeon: Barnett Abu, MD;  Location: [facility];  Service: Neurosurgery;  Laterality: Left;  LEFT Parietal Craniotomy for tumor with stealth   DEEP AXILLARY SENTINEL NODE BIOPSY / EXCISION     due to extensive Melanoma-right arm   fatty tumor removed  2000   from chest   INSERT / REPLACE / REMOVE PACEMAKER  2012   1999, x 3   LAPAROSCOPY  09/06/2011   Procedure: LAPAROSCOPY OPERATIVE;  Surgeon: Tresa Endo A. Ernestina Penna, MD;  Location: [facility]S;  Service: Gynecology;  Laterality: Left;  with Left Ovarian Cystectomy    MELANOMA EXCISION     with removal of lymph nodes, left shoulder   NOSE SURGERY  2010, 2012   for nose bleeds x 2   SINOSCOPY     TONSILLECTOMY AND ADENOIDECTOMY     TOTAL HIP  ARTHROPLASTY Right 05/18/2022   Procedure: RIGHT TOTAL HIP ARTHROPLASTY ANTERIOR APPROACH;  Surgeon: Kathryne Hitch, MD;  Location: [facility] ORS;  Service: Orthopedics;  Laterality: Right;   Patient Active Problem List   Diagnosis Date Noted   Status post total replacement of right hip 05/18/2022   Avascular necrosis of bone of right hip (HCC) 04/19/2022   Moderate persistent asthma 12/08/2018   Perennial allergic rhinitis with predominantly nonallergic component 12/08/2018   Allergic conjunctivitis 12/08/2018   History of epistaxis 12/08/2018   Atopic dermatitis 12/08/2018   Recurrent urticaria 12/08/2018   Skeeter syndrome 12/08/2018   Skin lesion of chest wall 07/22/2018   S/P laparoscopic cholecystectomy 03/28/2018   Dehydration 05/18/2016   Type 2 diabetes mellitus without complication, without long-term current use of insulin (HCC) 11/01/2015   History of pacemaker    POTS (postural orthostatic  tachycardia syndrome)    Iron deficiency anemia 11/11/2014   Chronic colitis 08/28/2013   Malignant melanoma, metastatic (HCC) 02/09/2013   Malignant melanoma (HCC) 12/01/2012   Brain metastasis 10/16/2012   Hypertension 09/30/2012   Anxiety state, unspecified 09/30/2012   Hypoxemia 09/30/2012   Disorder of brain 09/08/2012   Dysautonomia (HCC) 02/25/2011   Anticoagulant long-term use 01/19/2011   Neuritis 01/19/2011   Skin change 01/19/2011   CVA (cerebral infarction)    Palpitation 06/28/2010   Pacemaker-Biotronik 06/28/2010   Dizziness and giddiness 04/27/2010   DIVERTICULOSIS, COLON, HX OF 01/19/2009   CAROTID SINUS SYNDROME 11/17/2008   OTHER MALAISE AND FATIGUE 11/17/2008   HYPERTENSION, BENIGN 11/02/2008   ATRIAL TACHYCARDIA 09/16/2008   SYNCOPE, HX OF 08/09/2008   INJURY UNSPEC NERVE SHOULDER GIRDLE&UPPER LIMB 07/12/2008   PERSONAL HISTORY OF MALIGNANT MELANOMA OF SKIN 07/12/2008   LUQ PAIN 12/08/2007   NECK PAIN 09/26/2007   NUMBNESS, ARM 09/26/2007   CPK, ABNORMAL 09/26/2007   ARM PAIN, LEFT 06/25/2007   SWELLING OF LIMB 06/25/2007   OSTEOPENIA 06/25/2007    PCP: Madelin Headings, MD   REFERRING PROVIDER: Kathryne Hitch, MD  REFERRING DIAG: [phone] (ICD-10-CM) - It band syndrome, left  THERAPY DIAG:  Pain in left hip  Muscle weakness (generalized)  Unsteadiness on feet  Rationale for Evaluation and Treatment: Rehabilitation  ONSET DATE:  2-3 months (prior to IE)  NEXT MD VISIT: 11/14/22   SUBJECTIVE:   SUBJECTIVE STATEMENT: Pt reports some relief from DN but then pain comes back. She reports she called the MD office about the ongoing pain and the leg length discrepancy and the MD wants to see her first before she makes any modifications to her shoes - appt on Wed 11/14/22.  PERTINENT HISTORY:  anxiety, asthma, DM-II, HTN, CVA, DVT, POTS, pacemaker, malignant melanoma with brain, liver and lung metastasis s/p radiation and L parietal  craniotomy, osteopenia, diverticulitis, GERD, and Raynaud's disease. R THA 05/19/22 PAIN:  Are you having pain? Yes: NPRS scale: 7/10 Pain location: L hip pain Pain description: aching, sharp when laying on it Aggravating factors: laying down, sitting prolonged periods Relieving factors: getting up  PRECAUTIONS: ICD/Pacemaker and Other: POTS, advised to stay out of heat, has LE lymphedema  RED FLAGS: None   WEIGHT BEARING RESTRICTIONS: No  FALLS:  Has patient fallen in last 6 months? No  LIVING ENVIRONMENT: Lives with: lives with their spouse Lives in: House/apartment Stairs: Yes: Internal: 13 steps; on right going up, on left going up, and can reach both Has following equipment at home: Single point cane, Walker - 2 wheeled, and shower chair  OCCUPATION: medically disabled due to cancer.   PLOF: Independent walking 2-3 miles, 3-4x/week  PATIENT GOALS: be able to get up from floor, work on balance, decrease L hip pain.     OBJECTIVE:   DIAGNOSTIC FINDINGS: 10/08/2022 XR Bilateral Hips An AP pelvis and lateral both hips shows a well-seated right total hip  arthroplasty.  There is no evidence of AVN with the left hip.   PATIENT SURVEYS:  LEFS 46/80  COGNITION: Overall cognitive status: Within functional limits for tasks assessed     SENSATION: Neuropathy bil feet  EDEMA:  History bil LE edema- being treated at lymphedema clinic  MUSCLE LENGTH: Hamstrings: moderate tightness bilaterally  POSTURE:  weight shift left and leg length discrepancy with R LE longer than L  PALPATION: Tenderness over L glutes, L piriforomis, L greater trochanter, along L Itband.   LOWER EXTREMITY ROM:  Active ROM Right eval Left eval  Hip flexion 140 140  Hip extension    Hip internal rotation 25 25  Hip external rotation 60 60   (Blank rows = not tested)  LOWER EXTREMITY MMT:  MMT Right eval Left eval  Hip flexion 5 5  Hip extension 5 4  Hip abduction 5  5   Hip  adduction 5(seated) 5 (seated)  Hip internal rotation    Hip external rotation    Knee flexion 5 5  Knee extension 5 5  Ankle dorsiflexion 5 5  Ankle plantarflexion 5 5   (Blank rows = not tested)  LOWER EXTREMITY SPECIAL TESTS:  Hip special tests: Ober's test: positive for pain  FUNCTIONAL TESTS:  5 times sit to stand: 14.5 sec  DGI - 24/24  GAIT: Distance walked: 50 Assistive device utilized: None Level of assistance: Complete Independence Comments: visually slow gait speed.    TODAY'S TREATMENT:                                                                                                                              DATE:   11/12/22 THERAPEUTIC EXERCISE: to improve flexibility, strength and mobility.  Demonstration, verbal and tactile cues throughout for technique.  Rec Bike - L2 x 6 min L glute med isometric into ball on wall 10 x 5" Wall squat + GTB hip ABD isometric 10 x 5" Fwd/back monster walk with looped GTB at ankles 2 x 20 ft  MANUAL THERAPY: To promote normalized muscle tension, improved flexibility, and reduced pain. Skilled palpation and monitoring of soft tissue during DN Trigger Point Dry-Needling  Treatment instructions: Expect mild to moderate muscle soreness. Patient verbalized understanding of these instructions and education. Patient Consent Given: Yes Education handout provided: Previously provided Muscles treated: L TFL, lateral quads, lateral HS, lateral glute med & piriformis Electrical stimulation performed: No Parameters: N/A Treatment response/outcome: Twitch Response Elicited and Palpable Increase in Muscle Length STM/DTM, manual TPR and pin & stretch to muscles addressed with DN   11/07/22 Therapeutic Exercise: to improve strength  and mobility.  Demo, verbal and tactile cues throughout for technique. Bike x 6 min  Squats 2 x 5 - noted rapid fatigue, with table touch Lunges - counter for support.  Manual Therapy: to decrease muscle spasm  and pain and improve mobility IASTM with foam roller to bil glutes, proximal hamstrings, STM/TPR to L piriformis and L glute med, skilled palpation and monitoring during dry needling. Trigger Point Dry-Needling  Treatment instructions: Expect mild to moderate muscle soreness. S/S of pneumothorax if dry needled over a lung field, and to seek immediate medical attention should they occur. Patient verbalized understanding of these instructions and education. Patient Consent Given: Yes Education handout provided: Previously provided Muscles treated: L glut med, L piriformis Electrical stimulation performed: No Parameters: N/A Treatment response/outcome: Twitch Response Elicited and Palpable Increase in Muscle Length   11/01/2022 Therapeutic Exercise: to improve strength and mobility.  Demo, verbal and tactile cues throughout for technique. Nustep L4 x 5 min  Supine ITB Stretch  Supine Hip Internal and External Rotation  Supine Bridge Supine bridges with pillow squeeze Prone hip extension x 10  Prone Hip Extension with Bent Knee  ITB Stretch at [employer]  Standing Hip Flexor Stretch  Therapeutic Activity:   DGI 5xSTS Self Care: Assessing LLD and discussing different options.     PATIENT EDUCATION:  Education details:  HEP update Person educated: Patient Education method: Explanation, Demonstration, and Handouts Education comprehension: verbalized understanding  HOME EXERCISE PROGRAM: Access Code: [email or web address] URL: [email or web address] Date: 11/07/2022 Prepared by: Harrie Foreman  Exercises - Supine ITB Stretch  - 1 x daily - 7 x weekly - 1 sets - 3 reps - 30 sec  hold - Supine Hip Internal and External Rotation  - 1 x daily - 7 x weekly - 2 sets - 10 reps - Supine Bridge with Mini Swiss Ball Between Knees  - 1 x daily - 7 x weekly - 2 sets - 10 reps - Prone Hip Extension with Bent Knee  - 1 x daily - 7 x weekly - 2 sets - 10 reps - ITB Stretch at Wall  - 1 x daily - 7  x weekly - 1 sets - 3 reps - 30 sec hold - Standing Hip Flexor Stretch  - 1 x daily - 7 x weekly - 1 sets - 3 reps - 30 sec hold - Squat with Chair Touch  - 3 x daily - 7 x weekly - 2 sets - 5 reps - Lunge with Counter Support  - 1 x daily - 7 x weekly - 2-3 sets - 10 reps   ASSESSMENT:  CLINICAL IMPRESSION: [name] continues to complain of increased L hip/ITB pain. She notes benefit from DN but states it does not last, although she continues to walk 4 miles per day, despite PT's encouragement to take a break until her pain is better controlled. She continues to have taut tender bands throughout her lateral hip and thigh musculature which were addressed with MT incorporating DN with emphasis on L TLF, lateral glutes and piriformis as well as lateral quads and HS. Good twitch responses elicited resulting in palpable reduction in muscle tension. MT followed by strengthening to promote improved stability and normalization of muscle activity. [name] will benefit from skilled PT to address above deficits to improve mobility and activity tolerance with decreased pain interference.    OBJECTIVE IMPAIRMENTS: Abnormal gait, decreased activity tolerance, decreased balance, decreased endurance, decreased mobility, difficulty walking, decreased strength, impaired flexibility, and pain.   ACTIVITY  LIMITATIONS: carrying, lifting, bending, standing, squatting, sleeping, and locomotion level  PARTICIPATION LIMITATIONS: meal prep, cleaning, laundry, and shopping  PERSONAL FACTORS: 3+ comorbidities: anxiety, asthma, DM-II, HTN, CVA, DVT, POTS, pacemaker, malignant melanoma with brain, liver and lung metastasis s/p radiation and L parietal craniotomy, osteopenia, diverticulitis, GERD, and Raynaud's disease. R THA 05/19/22  are also affecting patient's functional outcome.   REHAB POTENTIAL: Good  CLINICAL DECISION MAKING: Evolving/moderate complexity  EVALUATION COMPLEXITY: Moderate   GOALS: Goals reviewed with  patient? Yes  SHORT TERM GOALS: Target date: 11/07/2022   Patient will be independent with initial HEP. Baseline: given 11/01/22 Goal status: IN PROGRESS  2.  Patient will undergo further balance testing Baseline:  Goal status: MET 11/01/22 - competed DGI  LONG TERM GOALS: Target date: 12/19/2022   Patient will be independent with advanced/ongoing HEP to improve outcomes and carryover.  Baseline:  Goal status: IN PROGRESS  2.  Patient will report at least 75% improvement in Left hip pain to improve QOL. Baseline:  Goal status: IN PROGRESS  3.  Patient will demonstrate improved functional LE strength as demonstrated by 5x STS <14 seconds to decrease fall risk. Baseline: NT Goal status: IN PROGRESS  4.  Patient will report at least 9 points improvement on LEFS to demonstrate improved functional ability. Baseline: 46/80 Goal status: IN PROGRESS  5.  Patient will demonstrate at least 19/24 on DGI to decrease risk of falls. Baseline: NT Goal status: MET 11/01/22- 24/24  6.  Patient will be able to perform floor to stand transfer without use of furniture to assist safely.  Baseline: gets "stuck" when squatting down to pick up item, has to crawl to get back up.  Goal status: IN PROGRESS     PLAN:  PT FREQUENCY: 1-2x/week  PT DURATION: 8 weeks  PLANNED INTERVENTIONS: Therapeutic exercises, Therapeutic activity, Neuromuscular re-education, Balance training, Gait training, Patient/Family education, Self Care, Joint mobilization, Stair training, Orthotic/Fit training, Dry Needling, Spinal mobilization, Moist heat, Taping, Traction, Manual therapy, and Re-evaluation  PLAN FOR NEXT SESSION: practice floor transfers per pt request, continue manual and TrDN, review and progress HEP for LE strengthening and balance.    Marry Guan, PT 11/12/2022, 6:07 PM

## 2022-11-12 NOTE — Telephone Encounter (Signed)
Patient has called multiple times over the last week trying to get in touch with me to discuss continued pain in Left upper thigh, leg, IT band area. She has been going to PT and they are trying different modalities including dry needling. She does walk and stay active (walked 4 miles recently), but states she is very off balance and is afraid of falling. [facility] couldn't get into [facility] 6 months b/c they are backed up, so she went to the [employer] store and they fit her for an insert. I didn't have any other recommendations except to come in. Wanted you aware. Appt. Wednesday, 11/14/22 to discuss. Thank you.

## 2022-11-14 ENCOUNTER — Ambulatory Visit (INDEPENDENT_AMBULATORY_CARE_PROVIDER_SITE_OTHER): Payer: Medicare Other | Admitting: Orthopaedic Surgery

## 2022-11-14 ENCOUNTER — Encounter: Payer: Self-pay | Admitting: Orthopaedic Surgery

## 2022-11-14 DIAGNOSIS — M2170 Unequal limb length (acquired), unspecified site: Secondary | ICD-10-CM | POA: Diagnosis not present

## 2022-11-14 DIAGNOSIS — M7632 Iliotibial band syndrome, left leg: Secondary | ICD-10-CM | POA: Diagnosis not present

## 2022-11-14 DIAGNOSIS — Z96641 Presence of right artificial hip joint: Secondary | ICD-10-CM

## 2022-11-14 DIAGNOSIS — M7062 Trochanteric bursitis, left hip: Secondary | ICD-10-CM

## 2022-11-14 NOTE — Progress Notes (Signed)
[name] is a very well-known to me.  She is an active 67 year old female who we replaced her right hip back in March of this year secondary to avascular necrosis.  Her AVN was due to steroid use over a long period time as she has dealt with recurrent melanoma.  She does have a leg length discrepancy of about a centimeter and a half when I have her lay in a supine position.  She does need an insert to wear in her left shoe.  She has been trying different lifts and inserts from [employer].  Most of her pain is now just related to trochanteric bursitis and IT band syndrome on that left side.  She does go to physical therapy for this and some of the dry needling has been helpful.  On exam her left hip moves smoothly and fluidly as does her right hip.  Again when I have her lay supine there is a leg length difference with the right side longer than the left.  For the first time we did offer her a steroid injection over the trochanteric area of her left hip and she agreed to this and tolerated it well.  From my standpoint we will see her back in 3 months and I would like a standing low AP pelvis at that visit.

## 2022-11-15 ENCOUNTER — Encounter: Payer: Self-pay | Admitting: Physical Therapy

## 2022-11-15 ENCOUNTER — Ambulatory Visit: Payer: Medicare Other | Admitting: Physical Therapy

## 2022-11-15 DIAGNOSIS — M25552 Pain in left hip: Secondary | ICD-10-CM | POA: Diagnosis not present

## 2022-11-15 DIAGNOSIS — M6281 Muscle weakness (generalized): Secondary | ICD-10-CM | POA: Diagnosis not present

## 2022-11-15 DIAGNOSIS — R2681 Unsteadiness on feet: Secondary | ICD-10-CM

## 2022-11-15 NOTE — Therapy (Signed)
OUTPATIENT PHYSICAL THERAPY TREATMENT   Patient Name: [name] MRN: [id number] DOB:March 03, 1955, 67 y.o., female Today's Date: 11/15/2022  END OF SESSION:  PT End of Session - 11/15/22 1526     Visit Number 5    Date for PT Re-Evaluation 12/19/22    Authorization Type [employer]    Progress Note Due on Visit 10    PT Start Time 1455    PT Stop Time 1525    PT Time Calculation (min) 30 min    Activity Tolerance Patient tolerated treatment well    Behavior During Therapy WFL for tasks assessed/performed             Past Medical History:  Diagnosis Date   Anemia    Angio-edema    Anxiety    Arthritis    Asthma    Atrial fibrillation (HCC)    Atrial tachycardia    Autonomic dysfunction    Complication of anesthesia    pt states wakes up with "shakes"   CVA (cerebral infarction)    2012 with dizziness and vision change felt embolic from atrial tachy   Disorder of bone and cartilage, unspecified    Diverticulosis    DM (diabetes mellitus) (HCC)    DVT (deep venous thrombosis) (HCC)    Eczema    Family history of anesthesia complication    PONV and " shaking "   Fatty liver    GERD (gastroesophageal reflux disease)    History of hiatal hernia    History of radiation therapy 10/24/2012   brain   HLD (hyperlipidemia)    HTN (hypertension)    Hypoglycemia, unspecified    Liver metastasis    Lung metastasis    Metastasis to brain (HCC)    Osteopenia    Other nonspecific abnormal serum enzyme levels    Other specified congenital anomalies of nervous system    Pacemaker    autonomic dysfunction   Pancreatitis    from therapy   Peripheral vascular disease (HCC)    POTS (postural orthostatic tachycardia syndrome)    Raynaud's disease    due to chemo   Skin cancer    Hx: of lung lesion   Past Surgical History:  Procedure Laterality Date   ABLATION SAPHENOUS VEIN W/ RFA     2002 x3   CARPAL TUNNEL RELEASE Left 2000   CHOLECYSTECTOMY N/A 03/28/2018    Procedure: LAPAROSCOPIC CHOLECYSTECTOMY WITH INTRAOPERATIVE CHOLANGIOGRAM;  Surgeon: Luretha Murphy, MD;  Location: [facility] OR;  Service: General;  Laterality: N/A;   CRANIOTOMY N/A 09/30/2012   suboccipital craniectomy   CRANIOTOMY Left 02/09/2013   Procedure: LEFT PARIETAL CRANIOTOMY with stealth;  Surgeon: Barnett Abu, MD;  Location: [facility];  Service: Neurosurgery;  Laterality: Left;  LEFT Parietal Craniotomy for tumor with stealth   DEEP AXILLARY SENTINEL NODE BIOPSY / EXCISION     due to extensive Melanoma-right arm   fatty tumor removed  2000   from chest   INSERT / REPLACE / REMOVE PACEMAKER  2012   1999, x 3   LAPAROSCOPY  09/06/2011   Procedure: LAPAROSCOPY OPERATIVE;  Surgeon: Tresa Endo A. Ernestina Penna, MD;  Location: [facility];  Service: Gynecology;  Laterality: Left;  with Left Ovarian Cystectomy    MELANOMA EXCISION     with removal of lymph nodes, left shoulder   NOSE SURGERY  2010, 2012   for nose bleeds x 2   SINOSCOPY     TONSILLECTOMY AND ADENOIDECTOMY     TOTAL HIP  ARTHROPLASTY Right 05/18/2022   Procedure: RIGHT TOTAL HIP ARTHROPLASTY ANTERIOR APPROACH;  Surgeon: Kathryne Hitch, MD;  Location: [facility];  Service: Orthopedics;  Laterality: Right;   Patient Active Problem List   Diagnosis Date Noted   Status post total replacement of right hip 05/18/2022   Avascular necrosis of bone of right hip (HCC) 04/19/2022   Moderate persistent asthma 12/08/2018   Perennial allergic rhinitis with predominantly nonallergic component 12/08/2018   Allergic conjunctivitis 12/08/2018   History of epistaxis 12/08/2018   Atopic dermatitis 12/08/2018   Recurrent urticaria 12/08/2018   Skeeter syndrome 12/08/2018   Skin lesion of chest wall 07/22/2018   S/P laparoscopic cholecystectomy 03/28/2018   Dehydration 05/18/2016   Type 2 diabetes mellitus without complication, without long-term current use of insulin (HCC) 11/01/2015   History of pacemaker    POTS (postural orthostatic  tachycardia syndrome)    Iron deficiency anemia 11/11/2014   Chronic colitis 08/28/2013   Malignant melanoma, metastatic (HCC) 02/09/2013   Malignant melanoma (HCC) 12/01/2012   Brain metastasis 10/16/2012   Hypertension 09/30/2012   Anxiety state, unspecified 09/30/2012   Hypoxemia 09/30/2012   Disorder of brain 09/08/2012   Dysautonomia (HCC) 02/25/2011   Anticoagulant long-term use 01/19/2011   Neuritis 01/19/2011   Skin change 01/19/2011   CVA (cerebral infarction)    Palpitation 06/28/2010   Pacemaker-Biotronik 06/28/2010   Dizziness and giddiness 04/27/2010   DIVERTICULOSIS, COLON, HX OF 01/19/2009   CAROTID SINUS SYNDROME 11/17/2008   OTHER MALAISE AND FATIGUE 11/17/2008   HYPERTENSION, BENIGN 11/02/2008   ATRIAL TACHYCARDIA 09/16/2008   SYNCOPE, HX OF 08/09/2008   INJURY UNSPEC NERVE SHOULDER GIRDLE&UPPER LIMB 07/12/2008   PERSONAL HISTORY OF MALIGNANT MELANOMA OF SKIN 07/12/2008   LUQ PAIN 12/08/2007   NECK PAIN 09/26/2007   NUMBNESS, ARM 09/26/2007   CPK, ABNORMAL 09/26/2007   ARM PAIN, LEFT 06/25/2007   SWELLING OF LIMB 06/25/2007   OSTEOPENIA 06/25/2007    PCP: Madelin Headings, MD   REFERRING PROVIDER: Kathryne Hitch, MD  REFERRING DIAG: [phone] (ICD-10-CM) - It band syndrome, left  THERAPY DIAG:  Pain in left hip  Muscle weakness (generalized)  Unsteadiness on feet  Rationale for Evaluation and Treatment: Rehabilitation  ONSET DATE:  2-3 months (prior to IE)  NEXT MD VISIT: 11/14/22   SUBJECTIVE:   SUBJECTIVE STATEMENT: [name] saw Dr. Magnus Ivan yesterday who agreed there is a leg length discrepancy - measured 1.5cm and gave her a cortisone shot in her L hip, recommended no TrDN today, but some massage to work cortisone in.  Hip is really sore today so didn't walk yesterday or today.   PERTINENT HISTORY:  anxiety, asthma, DM-II, HTN, CVA, DVT, POTS, pacemaker, malignant melanoma with brain, liver and lung metastasis s/p  radiation and L parietal craniotomy, osteopenia, diverticulitis, GERD, and Raynaud's disease. R THA 05/19/22 PAIN:  Are you having pain? Yes: NPRS scale: 7/10 Pain location: L hip pain Pain description: aching, sharp when laying on it Aggravating factors: laying down, sitting prolonged periods Relieving factors: getting up  PRECAUTIONS: ICD/Pacemaker and Other: POTS, advised to stay out of heat, has LE lymphedema  RED FLAGS: None   WEIGHT BEARING RESTRICTIONS: No  FALLS:  Has patient fallen in last 6 months? No  LIVING ENVIRONMENT: Lives with: lives with their spouse Lives in: House/apartment Stairs: Yes: Internal: 13 steps; on right going up, on left going up, and can reach both Has following equipment at home: Single point cane, Walker - 2 wheeled,  and shower chair  OCCUPATION: medically disabled due to cancer.   PLOF: Independent walking 2-3 miles, 3-4x/week  PATIENT GOALS: be able to get up from floor, work on balance, decrease L hip pain.     OBJECTIVE:   DIAGNOSTIC FINDINGS: 10/08/2022 XR Bilateral Hips An AP pelvis and lateral both hips shows a well-seated right total hip  arthroplasty.  There is no evidence of AVN with the left hip.   PATIENT SURVEYS:  LEFS 46/80  COGNITION: Overall cognitive status: Within functional limits for tasks assessed     SENSATION: Neuropathy bil feet  EDEMA:  History bil LE edema- being treated at lymphedema clinic  MUSCLE LENGTH: Hamstrings: moderate tightness bilaterally  POSTURE:  weight shift left and leg length discrepancy with R LE longer than L  PALPATION: Tenderness over L glutes, L piriforomis, L greater trochanter, along L Itband.   LOWER EXTREMITY ROM:  Active ROM Right eval Left eval  Hip flexion 140 140  Hip extension    Hip internal rotation 25 25  Hip external rotation 60 60   (Blank rows = not tested)  LOWER EXTREMITY MMT:  MMT Right eval Left eval  Hip flexion 5 5  Hip extension 5 4  Hip  abduction 5  5   Hip adduction 5(seated) 5 (seated)  Hip internal rotation    Hip external rotation    Knee flexion 5 5  Knee extension 5 5  Ankle dorsiflexion 5 5  Ankle plantarflexion 5 5   (Blank rows = not tested)  LOWER EXTREMITY SPECIAL TESTS:  Hip special tests: Ober's test: positive for pain  FUNCTIONAL TESTS:  5 times sit to stand: 14.5 sec  DGI - 24/24  GAIT: Distance walked: 50 Assistive device utilized: None Level of assistance: Complete Independence Comments: visually slow gait speed.    TODAY'S TREATMENT:                                                                                                                              DATE:  11/15/2022 Manual Therapy: to decrease muscle spasm and pain and improve mobility IASTM with foam roller to L glutes, proximal hamstrings, TFL, IASTM with s/s tool to Itband especially distal and lateral hamstrings, STM/TPR to piriformis and glutes.   11/12/22 THERAPEUTIC EXERCISE: to improve flexibility, strength and mobility.  Demonstration, verbal and tactile cues throughout for technique.  Rec Bike - L2 x 6 min L glute med isometric into ball on wall 10 x 5" Wall squat + GTB hip ABD isometric 10 x 5" Fwd/back monster walk with looped GTB at ankles 2 x 20 ft  MANUAL THERAPY: To promote normalized muscle tension, improved flexibility, and reduced pain. Skilled palpation and monitoring of soft tissue during DN Trigger Point Dry-Needling  Treatment instructions: Expect mild to moderate muscle soreness. Patient verbalized understanding of these instructions and education. Patient Consent Given: Yes Education handout provided: Previously provided Muscles treated: L TFL, lateral quads, lateral  HS, lateral glute med & piriformis Electrical stimulation performed: No Parameters: N/A Treatment response/outcome: Twitch Response Elicited and Palpable Increase in Muscle Length STM/DTM, manual TPR and pin & stretch to muscles addressed  with DN   11/07/22 Therapeutic Exercise: to improve strength and mobility.  Demo, verbal and tactile cues throughout for technique. Bike x 6 min  Squats 2 x 5 - noted rapid fatigue, with table touch Lunges - counter for support.  Manual Therapy: to decrease muscle spasm and pain and improve mobility IASTM with foam roller to bil glutes, proximal hamstrings, STM/TPR to L piriformis and L glute med, skilled palpation and monitoring during dry needling. Trigger Point Dry-Needling  Treatment instructions: Expect mild to moderate muscle soreness. S/S of pneumothorax if dry needled over a lung field, and to seek immediate medical attention should they occur. Patient verbalized understanding of these instructions and education. Patient Consent Given: Yes Education handout provided: Previously provided Muscles treated: L glut med, L piriformis Electrical stimulation performed: No Parameters: N/A Treatment response/outcome: Twitch Response Elicited and Palpable Increase in Muscle Length  PATIENT EDUCATION:  Education details: recommended decreasing walking distance to figure out tolerance Person educated: Patient Education method: Explanation Education comprehension: verbalized understanding  HOME EXERCISE PROGRAM: Access Code: [email or web address] URL: [email or web address] Date: 11/07/2022 Prepared by: Harrie Foreman  Exercises - Supine ITB Stretch  - 1 x daily - 7 x weekly - 1 sets - 3 reps - 30 sec  hold - Supine Hip Internal and External Rotation  - 1 x daily - 7 x weekly - 2 sets - 10 reps - Supine Bridge with Mini Swiss Ball Between Knees  - 1 x daily - 7 x weekly - 2 sets - 10 reps - Prone Hip Extension with Bent Knee  - 1 x daily - 7 x weekly - 2 sets - 10 reps - ITB Stretch at Wall  - 1 x daily - 7 x weekly - 1 sets - 3 reps - 30 sec hold - Standing Hip Flexor Stretch  - 1 x daily - 7 x weekly - 1 sets - 3 reps - 30 sec hold - Squat with Chair Touch  - 3 x daily - 7 x weekly  - 2 sets - 5 reps - Lunge with Counter Support  - 1 x daily - 7 x weekly - 2-3 sets - 10 reps   ASSESSMENT:  CLINICAL IMPRESSION: Uniquia saw orthopedist yesterday and received cortisone shot, so today focused on manual therapy to decrease muscle tension in glutes and along Itband.  Tolerated well, reported decreased pain and soreness following.  We did discuss trying to decrease distance to see if there is an amount that would not aggravate her symptoms.   [name] will benefit from skilled PT to address above deficits to improve mobility and activity tolerance with decreased pain interference.    OBJECTIVE IMPAIRMENTS: Abnormal gait, decreased activity tolerance, decreased balance, decreased endurance, decreased mobility, difficulty walking, decreased strength, impaired flexibility, and pain.   ACTIVITY LIMITATIONS: carrying, lifting, bending, standing, squatting, sleeping, and locomotion level  PARTICIPATION LIMITATIONS: meal prep, cleaning, laundry, and shopping  PERSONAL FACTORS: 3+ comorbidities: anxiety, asthma, DM-II, HTN, CVA, DVT, POTS, pacemaker, malignant melanoma with brain, liver and lung metastasis s/p radiation and L parietal craniotomy, osteopenia, diverticulitis, GERD, and Raynaud's disease. R THA 05/19/22  are also affecting patient's functional outcome.   REHAB POTENTIAL: Good  CLINICAL DECISION MAKING: Evolving/moderate complexity  EVALUATION COMPLEXITY: Moderate   GOALS: Goals reviewed with patient? Yes  SHORT TERM GOALS: Target date: 11/07/2022   Patient will be independent with initial HEP. Baseline: given 11/01/22 Goal status: IN PROGRESS  2.  Patient will undergo further balance testing Baseline:  Goal status: MET 11/01/22 - competed DGI  LONG TERM GOALS: Target date: 12/19/2022   Patient will be independent with advanced/ongoing HEP to improve outcomes and carryover.  Baseline:  Goal status: IN PROGRESS  2.  Patient will report at least 75% improvement in  Left hip pain to improve QOL. Baseline:  Goal status: IN PROGRESS  3.  Patient will demonstrate improved functional LE strength as demonstrated by 5x STS <14 seconds to decrease fall risk. Baseline: NT Goal status: IN PROGRESS  4.  Patient will report at least 9 points improvement on LEFS to demonstrate improved functional ability. Baseline: 46/80 Goal status: IN PROGRESS  5.  Patient will demonstrate at least 19/24 on DGI to decrease risk of falls. Baseline: NT Goal status: MET 11/01/22- 24/24  6.  Patient will be able to perform floor to stand transfer without use of furniture to assist safely.  Baseline: gets "stuck" when squatting down to pick up item, has to crawl to get back up.  Goal status: IN PROGRESS     PLAN:  PT FREQUENCY: 1-2x/week  PT DURATION: 8 weeks  PLANNED INTERVENTIONS: Therapeutic exercises, Therapeutic activity, Neuromuscular re-education, Balance training, Gait training, Patient/Family education, Self Care, Joint mobilization, Stair training, Orthotic/Fit training, Dry Needling, Spinal mobilization, Moist heat, Taping, Traction, Manual therapy, and Re-evaluation  PLAN FOR NEXT SESSION: practice floor transfers per pt request, continue manual and TrDN, review and progress HEP for LE strengthening and balance.    Jena Gauss, PT 11/15/2022, 3:33 PM

## 2022-11-19 ENCOUNTER — Ambulatory Visit: Payer: Medicare Other | Admitting: Physical Therapy

## 2022-11-19 ENCOUNTER — Encounter: Payer: Self-pay | Admitting: Physical Therapy

## 2022-11-19 DIAGNOSIS — R2681 Unsteadiness on feet: Secondary | ICD-10-CM

## 2022-11-19 DIAGNOSIS — M6281 Muscle weakness (generalized): Secondary | ICD-10-CM | POA: Diagnosis not present

## 2022-11-19 DIAGNOSIS — M25552 Pain in left hip: Secondary | ICD-10-CM | POA: Diagnosis not present

## 2022-11-19 NOTE — Therapy (Signed)
OUTPATIENT PHYSICAL THERAPY TREATMENT   Patient Name: [name] MRN: [id number] DOB:1955/07/19, 67 y.o., female Today's Date: 11/19/2022  END OF SESSION:  PT End of Session - 11/19/22 1356     Visit Number 6    Date for PT Re-Evaluation 12/19/22    Authorization Type [employer]    Progress Note Due on Visit 10    PT Start Time 1400    PT Stop Time 1449    PT Time Calculation (min) 49 min    Activity Tolerance Patient tolerated treatment well    Behavior During Therapy WFL for tasks assessed/performed             Past Medical History:  Diagnosis Date   Anemia    Angio-edema    Anxiety    Arthritis    Asthma    Atrial fibrillation (HCC)    Atrial tachycardia    Autonomic dysfunction    Complication of anesthesia    pt states wakes up with "shakes"   CVA (cerebral infarction)    2012 with dizziness and vision change felt embolic from atrial tachy   Disorder of bone and cartilage, unspecified    Diverticulosis    DM (diabetes mellitus) (HCC)    DVT (deep venous thrombosis) (HCC)    Eczema    Family history of anesthesia complication    PONV and " shaking "   Fatty liver    GERD (gastroesophageal reflux disease)    History of hiatal hernia    History of radiation therapy 10/24/2012   brain   HLD (hyperlipidemia)    HTN (hypertension)    Hypoglycemia, unspecified    Liver metastasis    Lung metastasis    Metastasis to brain (HCC)    Osteopenia    Other nonspecific abnormal serum enzyme levels    Other specified congenital anomalies of nervous system    Pacemaker    autonomic dysfunction   Pancreatitis    from therapy   Peripheral vascular disease (HCC)    POTS (postural orthostatic tachycardia syndrome)    Raynaud's disease    due to chemo   Skin cancer    Hx: of lung lesion   Past Surgical History:  Procedure Laterality Date   ABLATION SAPHENOUS VEIN W/ RFA     2002 x3   CARPAL TUNNEL RELEASE Left 2000   CHOLECYSTECTOMY N/A 03/28/2018    Procedure: LAPAROSCOPIC CHOLECYSTECTOMY WITH INTRAOPERATIVE CHOLANGIOGRAM;  Surgeon: Luretha Murphy, MD;  Location: [facility];  Service: General;  Laterality: N/A;   CRANIOTOMY N/A 09/30/2012   suboccipital craniectomy   CRANIOTOMY Left 02/09/2013   Procedure: LEFT PARIETAL CRANIOTOMY with stealth;  Surgeon: Barnett Abu, MD;  Location: [facility];  Service: Neurosurgery;  Laterality: Left;  LEFT Parietal Craniotomy for tumor with stealth   DEEP AXILLARY SENTINEL NODE BIOPSY / EXCISION     due to extensive Melanoma-right arm   fatty tumor removed  2000   from chest   INSERT / REPLACE / REMOVE PACEMAKER  2012   1999, x 3   LAPAROSCOPY  09/06/2011   Procedure: LAPAROSCOPY OPERATIVE;  Surgeon: Tresa Endo A. Ernestina Penna, MD;  Location: [facility];  Service: Gynecology;  Laterality: Left;  with Left Ovarian Cystectomy    MELANOMA EXCISION     with removal of lymph nodes, left shoulder   NOSE SURGERY  2010, 2012   for nose bleeds x 2   SINOSCOPY     TONSILLECTOMY AND ADENOIDECTOMY     TOTAL HIP  ARTHROPLASTY Right 05/18/2022   Procedure: RIGHT TOTAL HIP ARTHROPLASTY ANTERIOR APPROACH;  Surgeon: Kathryne Hitch, MD;  Location: [facility];  Service: Orthopedics;  Laterality: Right;   Patient Active Problem List   Diagnosis Date Noted   Status post total replacement of right hip 05/18/2022   Avascular necrosis of bone of right hip (HCC) 04/19/2022   Moderate persistent asthma 12/08/2018   Perennial allergic rhinitis with predominantly nonallergic component 12/08/2018   Allergic conjunctivitis 12/08/2018   History of epistaxis 12/08/2018   Atopic dermatitis 12/08/2018   Recurrent urticaria 12/08/2018   Skeeter syndrome 12/08/2018   Skin lesion of chest wall 07/22/2018   S/P laparoscopic cholecystectomy 03/28/2018   Dehydration 05/18/2016   Type 2 diabetes mellitus without complication, without long-term current use of insulin (HCC) 11/01/2015   History of pacemaker    POTS (postural orthostatic  tachycardia syndrome)    Iron deficiency anemia 11/11/2014   Chronic colitis 08/28/2013   Malignant melanoma, metastatic (HCC) 02/09/2013   Malignant melanoma (HCC) 12/01/2012   Brain metastasis 10/16/2012   Hypertension 09/30/2012   Anxiety state, unspecified 09/30/2012   Hypoxemia 09/30/2012   Disorder of brain 09/08/2012   Dysautonomia (HCC) 02/25/2011   Anticoagulant long-term use 01/19/2011   Neuritis 01/19/2011   Skin change 01/19/2011   CVA (cerebral infarction)    Palpitation 06/28/2010   Pacemaker-Biotronik 06/28/2010   Dizziness and giddiness 04/27/2010   DIVERTICULOSIS, COLON, HX OF 01/19/2009   CAROTID SINUS SYNDROME 11/17/2008   OTHER MALAISE AND FATIGUE 11/17/2008   HYPERTENSION, BENIGN 11/02/2008   ATRIAL TACHYCARDIA 09/16/2008   SYNCOPE, HX OF 08/09/2008   INJURY UNSPEC NERVE SHOULDER GIRDLE&UPPER LIMB 07/12/2008   PERSONAL HISTORY OF MALIGNANT MELANOMA OF SKIN 07/12/2008   LUQ PAIN 12/08/2007   NECK PAIN 09/26/2007   NUMBNESS, ARM 09/26/2007   CPK, ABNORMAL 09/26/2007   ARM PAIN, LEFT 06/25/2007   SWELLING OF LIMB 06/25/2007   OSTEOPENIA 06/25/2007    PCP: Madelin Headings, MD   REFERRING PROVIDER: Kathryne Hitch, MD  REFERRING DIAG: [phone] (ICD-10-CM) - It band syndrome, left  THERAPY DIAG:  Pain in left hip  Muscle weakness (generalized)  Unsteadiness on feet  Rationale for Evaluation and Treatment: Rehabilitation  ONSET DATE:  2-3 months (prior to IE)  NEXT MD VISIT: 11/14/22   SUBJECTIVE:   SUBJECTIVE STATEMENT: [name]  reports hip is feeling much better, the shot really helped.   Forgot insert in shoe.   PERTINENT HISTORY:  anxiety, asthma, DM-II, HTN, CVA, DVT, POTS, pacemaker, malignant melanoma with brain, liver and lung metastasis s/p radiation and L parietal craniotomy, osteopenia, diverticulitis, GERD, and Raynaud's disease. R THA 05/19/22 PAIN:  Are you having pain? Yes: NPRS scale: 3/10 Pain location: L hip  pain Pain description: aching, sharp when laying on it Aggravating factors: laying down, sitting prolonged periods Relieving factors: getting up  PRECAUTIONS: ICD/Pacemaker and Other: POTS, advised to stay out of heat, has LE lymphedema  RED FLAGS: None   WEIGHT BEARING RESTRICTIONS: No  FALLS:  Has patient fallen in last 6 months? No  LIVING ENVIRONMENT: Lives with: lives with their spouse Lives in: House/apartment Stairs: Yes: Internal: 13 steps; on right going up, on left going up, and can reach both Has following equipment at home: Single point cane, Walker - 2 wheeled, and shower chair  OCCUPATION: medically disabled due to cancer.   PLOF: Independent walking 2-3 miles, 3-4x/week  PATIENT GOALS: be able to get up from floor, work on balance,  decrease L hip pain.     OBJECTIVE:   DIAGNOSTIC FINDINGS: 10/08/2022 XR Bilateral Hips An AP pelvis and lateral both hips shows a well-seated right total hip  arthroplasty.  There is no evidence of AVN with the left hip.   PATIENT SURVEYS:  LEFS 46/80  COGNITION: Overall cognitive status: Within functional limits for tasks assessed     SENSATION: Neuropathy bil feet  EDEMA:  History bil LE edema- being treated at lymphedema clinic  MUSCLE LENGTH: Hamstrings: moderate tightness bilaterally  POSTURE:  weight shift left and leg length discrepancy with R LE longer than L  PALPATION: Tenderness over L glutes, L piriforomis, L greater trochanter, along L Itband.   LOWER EXTREMITY ROM:  Active ROM Right eval Left eval  Hip flexion 140 140  Hip extension    Hip internal rotation 25 25  Hip external rotation 60 60   (Blank rows = not tested)  LOWER EXTREMITY MMT:  MMT Right eval Left eval  Hip flexion 5 5  Hip extension 5 4  Hip abduction 5  5   Hip adduction 5(seated) 5 (seated)  Hip internal rotation    Hip external rotation    Knee flexion 5 5  Knee extension 5 5  Ankle dorsiflexion 5 5  Ankle  plantarflexion 5 5   (Blank rows = not tested)  LOWER EXTREMITY SPECIAL TESTS:  Hip special tests: Ober's test: positive for pain  FUNCTIONAL TESTS:  5 times sit to stand: 14.5 sec  DGI - 24/24  GAIT: Distance walked: 50 Assistive device utilized: None Level of assistance: Complete Independence Comments: visually slow gait speed.    TODAY'S TREATMENT:                                                                                                                              DATE:   11/19/22 Therapeutic Exercise: to improve strength and mobility.  Demo, verbal and tactile cues throughout for technique. Bike L2 x 6 min  Pilates lunges with sliding opposite foot on towel - at counter for safety - side x 10 each, back x 10 each, forward x 10 each  Kneel to tall kneel x 10 Hip 90/90's x 10 each  Transitioning from 90/90 position to quadruped (just lifting hip) 2 x 3 each side) Long sit hip flexor lifts over box 3 x 5 each side Manual Therapy: to decrease muscle spasm and pain and improve mobility IASTM with foam roller to L glutes, proximal hamstrings, TFL, IASTM with s/s tool to Itband especially distal and lateral hamstrings, STM/TPR to piriformis and glutes.   11/15/2022 Manual Therapy: to decrease muscle spasm and pain and improve mobility IASTM with foam roller to L glutes, proximal hamstrings, TFL, IASTM with s/s tool to Itband especially distal and lateral hamstrings, STM/TPR to piriformis and glutes.   11/12/22 THERAPEUTIC EXERCISE: to improve flexibility, strength and mobility.  Demonstration, verbal and tactile cues throughout for technique.  Rec  Bike - L2 x 6 min L glute med isometric into ball on wall 10 x 5" Wall squat + GTB hip ABD isometric 10 x 5" Fwd/back monster walk with looped GTB at ankles 2 x 20 ft  MANUAL THERAPY: To promote normalized muscle tension, improved flexibility, and reduced pain. Skilled palpation and monitoring of soft tissue during DN Trigger  Point Dry-Needling  Treatment instructions: Expect mild to moderate muscle soreness. Patient verbalized understanding of these instructions and education. Patient Consent Given: Yes Education handout provided: Previously provided Muscles treated: L TFL, lateral quads, lateral HS, lateral glute med & piriformis Electrical stimulation performed: No Parameters: N/A Treatment response/outcome: Twitch Response Elicited and Palpable Increase in Muscle Length STM/DTM, manual TPR and pin & stretch to muscles addressed with DN   PATIENT EDUCATION:  Education details: HEP update Person educated: Patient Education method: Explanation, Demonstration, Verbal cues, and Handouts Education comprehension: verbalized understanding and returned demonstration  HOME EXERCISE PROGRAM: Access Code: [email or web address] URL: [email or web address] Date: 11/19/2022 Prepared by: Harrie Foreman  Exercises - Supine ITB Stretch  - 1 x daily - 7 x weekly - 1 sets - 3 reps - 30 sec  hold - Supine Hip Internal and External Rotation  - 1 x daily - 7 x weekly - 2 sets - 10 reps - Supine Bridge with Mini Swiss Ball Between Knees  - 1 x daily - 7 x weekly - 2 sets - 10 reps - Prone Hip Extension with Bent Knee  - 1 x daily - 7 x weekly - 2 sets - 10 reps - ITB Stretch at Wall  - 1 x daily - 7 x weekly - 1 sets - 3 reps - 30 sec hold - Standing Hip Flexor Stretch  - 1 x daily - 7 x weekly - 1 sets - 3 reps - 30 sec hold - Squat with Chair Touch  - 3 x daily - 7 x weekly - 2 sets - 5 reps - Lunge Matrix on Slider  - 1 x daily - 7 x weekly - 2-3 sets - 10 reps   ASSESSMENT:  CLINICAL IMPRESSION: Phylisha reports decreased pain today in L hip.  Continued working on functional strengthening to work on goal of transitioning from floor to stand without support, at end of session she was able to transition from sitting to standing without assistance.  Updated HEP.  Continued with just manual therapy without TrDN to L hip  again this session to loosen, discussed buying more heel lifts for sneakers to avoid walking without compensation for LLD.  Shiela will continue to benefit from skilled PT to address above deficits to improve mobility and activity tolerance with decreased pain interference.    OBJECTIVE IMPAIRMENTS: Abnormal gait, decreased activity tolerance, decreased balance, decreased endurance, decreased mobility, difficulty walking, decreased strength, impaired flexibility, and pain.   ACTIVITY LIMITATIONS: carrying, lifting, bending, standing, squatting, sleeping, and locomotion level  PARTICIPATION LIMITATIONS: meal prep, cleaning, laundry, and shopping  PERSONAL FACTORS: 3+ comorbidities: anxiety, asthma, DM-II, HTN, CVA, DVT, POTS, pacemaker, malignant melanoma with brain, liver and lung metastasis s/p radiation and L parietal craniotomy, osteopenia, diverticulitis, GERD, and Raynaud's disease. R THA 05/19/22  are also affecting patient's functional outcome.   REHAB POTENTIAL: Good  CLINICAL DECISION MAKING: Evolving/moderate complexity  EVALUATION COMPLEXITY: Moderate   GOALS: Goals reviewed with patient? Yes  SHORT TERM GOALS: Target date: 11/07/2022   Patient will be independent with initial HEP. Baseline: given 11/01/22 Goal status: IN PROGRESS  2.  Patient  will undergo further balance testing Baseline:  Goal status: MET 11/01/22 - competed DGI  LONG TERM GOALS: Target date: 12/19/2022   Patient will be independent with advanced/ongoing HEP to improve outcomes and carryover.  Baseline:  Goal status: IN PROGRESS  2.  Patient will report at least 75% improvement in Left hip pain to improve QOL. Baseline:  Goal status: IN PROGRESS  3.  Patient will demonstrate improved functional LE strength as demonstrated by 5x STS <14 seconds to decrease fall risk. Baseline: NT Goal status: IN PROGRESS  4.  Patient will report at least 9 points improvement on LEFS to demonstrate improved functional  ability. Baseline: 46/80 Goal status: IN PROGRESS  5.  Patient will demonstrate at least 19/24 on DGI to decrease risk of falls. Baseline: NT Goal status: MET 11/01/22- 24/24  6.  Patient will be able to perform floor to stand transfer without use of furniture to assist safely.  Baseline: gets "stuck" when squatting down to pick up item, has to crawl to get back up.  Goal status: IN PROGRESS     PLAN:  PT FREQUENCY: 1-2x/week  PT DURATION: 8 weeks  PLANNED INTERVENTIONS: Therapeutic exercises, Therapeutic activity, Neuromuscular re-education, Balance training, Gait training, Patient/Family education, Self Care, Joint mobilization, Stair training, Orthotic/Fit training, Dry Needling, Spinal mobilization, Moist heat, Taping, Traction, Manual therapy, and Re-evaluation  PLAN FOR NEXT SESSION: practice floor transfers per pt request, continue manual and TrDN, review and progress HEP for LE strengthening and balance.    Jena Gauss, PT, DPT  11/19/2022, 5:19 PM

## 2022-11-20 ENCOUNTER — Other Ambulatory Visit: Payer: Self-pay | Admitting: Internal Medicine

## 2022-11-20 MED ORDER — CLONAZEPAM 1 MG PO TABS: ORAL_TABLET | ORAL | 0 refills | Status: DC

## 2022-11-20 NOTE — Telephone Encounter (Signed)
Prescription Request  11/20/2022  LOV: 09/13/2022  What is the name of the medication or equipment? clonazePAM (KLONOPIN) 1 MG tablet   Have you contacted your pharmacy to request a refill? No   Which pharmacy would you like this sent to?  [id number] - [place], [place] - [address] [place] [address] Phone: [phone] Fax: [phone]     Patient notified that their request is being sent to the clinical staff for review and that they should receive a response within 2 business days.   Please advise at Mobile [phone] (mobile)

## 2022-11-21 ENCOUNTER — Encounter: Payer: Self-pay | Admitting: Physical Therapy

## 2022-11-21 ENCOUNTER — Ambulatory Visit: Payer: Medicare Other | Attending: Orthopaedic Surgery | Admitting: Physical Therapy

## 2022-11-21 DIAGNOSIS — R262 Difficulty in walking, not elsewhere classified: Secondary | ICD-10-CM | POA: Diagnosis not present

## 2022-11-21 DIAGNOSIS — C439 Malignant melanoma of skin, unspecified: Secondary | ICD-10-CM | POA: Diagnosis not present

## 2022-11-21 DIAGNOSIS — I890 Lymphedema, not elsewhere classified: Secondary | ICD-10-CM | POA: Diagnosis not present

## 2022-11-21 NOTE — Therapy (Addendum)
 OUTPATIENT PHYSICAL THERAPY ONCOLOGY TREATMENT  Patient Name: [name] MRN: [id number] DOB:[birth year], 67 y.o., female Today's Date: 11/21/2022  END OF SESSION:  PT End of Session - 11/21/22 1502     Visit Number 7    Number of Visits 9   for cancer rehab   Date for PT Re-Evaluation 01/16/23    Authorization Type [employer] Medicare    PT Start Time 1503    PT Stop Time 1530    PT Time Calculation (min) 27 min    Activity Tolerance Patient tolerated treatment well    Behavior During Therapy WFL for tasks assessed/performed                 Past Medical History:  Diagnosis Date   Anemia    Angio-edema    Anxiety    Arthritis    Asthma    Atrial fibrillation (HCC)    Atrial tachycardia    Autonomic dysfunction    Complication of anesthesia    pt states wakes up with shakes   CVA (cerebral infarction)    2012 with dizziness and vision change felt embolic from atrial tachy   Disorder of bone and cartilage, unspecified    Diverticulosis    DM (diabetes mellitus) (HCC)    DVT (deep venous thrombosis) (HCC)    Eczema    Family history of anesthesia complication    PONV and  shaking    Fatty liver    GERD (gastroesophageal reflux disease)    History of hiatal hernia    History of radiation therapy 10/24/2012   brain   HLD (hyperlipidemia)    HTN (hypertension)    Hypoglycemia, unspecified    Liver metastasis    Lung metastasis    Metastasis to brain (HCC)    Osteopenia    Other nonspecific abnormal serum enzyme levels    Other specified congenital anomalies of nervous system    Pacemaker    autonomic dysfunction   Pancreatitis    from therapy   Peripheral vascular disease (HCC)    POTS (postural orthostatic tachycardia syndrome)    Raynaud's disease    due to chemo   Skin cancer    Hx: of lung lesion   Past Surgical History:  Procedure Laterality Date   ABLATION SAPHENOUS VEIN W/ RFA     2002 x3   CARPAL TUNNEL RELEASE Left 2000    CHOLECYSTECTOMY N/A 03/28/2018   Procedure: LAPAROSCOPIC CHOLECYSTECTOMY WITH INTRAOPERATIVE CHOLANGIOGRAM;  Surgeon: Gladis Cough, MD;  Location: [facility] OR;  Service: General;  Laterality: N/A;   CRANIOTOMY N/A 09/30/2012   suboccipital craniectomy   CRANIOTOMY Left 02/09/2013   Procedure: LEFT PARIETAL CRANIOTOMY with stealth;  Surgeon: Victory Gens, MD;  Location: [facility];  Service: Neurosurgery;  Laterality: Left;  LEFT Parietal Craniotomy for tumor with stealth   DEEP AXILLARY SENTINEL NODE BIOPSY / EXCISION     due to extensive Melanoma-right arm   fatty tumor removed  2000   from chest   INSERT / REPLACE / REMOVE PACEMAKER  2012   1999, x 3   LAPAROSCOPY  09/06/2011   Procedure: LAPAROSCOPY OPERATIVE;  Surgeon: Burnard A. Kandyce, MD;  Location: [facility] ORS;  Service: Gynecology;  Laterality: Left;  with Left Ovarian Cystectomy    MELANOMA EXCISION     with removal of lymph nodes, left shoulder   NOSE SURGERY  2010, 2012   for nose bleeds x 2   SINOSCOPY     TONSILLECTOMY AND ADENOIDECTOMY  TOTAL HIP ARTHROPLASTY Right 05/18/2022   Procedure: RIGHT TOTAL HIP ARTHROPLASTY ANTERIOR APPROACH;  Surgeon: Vernetta Lonni GRADE, MD;  Location: [facility] ORS;  Service: Orthopedics;  Laterality: Right;   Patient Active Problem List   Diagnosis Date Noted   Status post total replacement of right hip 05/18/2022   Avascular necrosis of bone of right hip (HCC) 04/19/2022   Moderate persistent asthma 12/08/2018   Perennial allergic rhinitis with predominantly nonallergic component 12/08/2018   Allergic conjunctivitis 12/08/2018   History of epistaxis 12/08/2018   Atopic dermatitis 12/08/2018   Recurrent urticaria 12/08/2018   Skeeter syndrome 12/08/2018   Skin lesion of chest wall 07/22/2018   S/P laparoscopic cholecystectomy 03/28/2018   Dehydration 05/18/2016   Type 2 diabetes mellitus without complication, without long-term current use of insulin  (HCC) 11/01/2015   History of pacemaker     POTS (postural orthostatic tachycardia syndrome)    Iron deficiency anemia 11/11/2014   Chronic colitis 08/28/2013   Malignant melanoma, metastatic (HCC) 02/09/2013   Malignant melanoma (HCC) 12/01/2012   Brain metastasis 10/16/2012   Hypertension 09/30/2012   Anxiety state, unspecified 09/30/2012   Hypoxemia 09/30/2012   Disorder of brain 09/08/2012   Dysautonomia (HCC) 02/25/2011   Anticoagulant long-term use 01/19/2011   Neuritis 01/19/2011   Skin change 01/19/2011   CVA (cerebral infarction)    Palpitation 06/28/2010   Pacemaker-Biotronik 06/28/2010   Dizziness and giddiness 04/27/2010   DIVERTICULOSIS, COLON, HX OF 01/19/2009   CAROTID SINUS SYNDROME 11/17/2008   OTHER MALAISE AND FATIGUE 11/17/2008   HYPERTENSION, BENIGN 11/02/2008   ATRIAL TACHYCARDIA 09/16/2008   SYNCOPE, HX OF 08/09/2008   INJURY UNSPEC NERVE SHOULDER GIRDLE&UPPER LIMB 07/12/2008   PERSONAL HISTORY OF MALIGNANT MELANOMA OF SKIN 07/12/2008   LUQ PAIN 12/08/2007   NECK PAIN 09/26/2007   NUMBNESS, ARM 09/26/2007   CPK, ABNORMAL 09/26/2007   ARM PAIN, LEFT 06/25/2007   SWELLING OF LIMB 06/25/2007   OSTEOPENIA 06/25/2007    PCP: Apolinar Eastern, MD  REFERRING PROVIDER: Lonni Vernetta, MD  REFERRING DIAG:  M79.89 (ICD-10-CM) - Swelling of limb C43.9 (ICD-10-CM) - Malignant melanoma, metastatic (HCC) Z96.641 (ICD-10-CM) - Status post total replacement of right hip    THERAPY DIAG:  Lymphedema, not elsewhere classified  Difficulty in walking, not elsewhere classified  Malignant melanoma, unspecified site [facility])  ONSET DATE: 05/18/22  Rationale for Evaluation and Treatment: Rehabilitation  SUBJECTIVE:                                                                                                                                                                                           SUBJECTIVE STATEMENT: I am getting  PT for strength and for my IT band. I had an injection last week in my  IT band. The swelling in both of my legs is lymphatic. I had all the cardiovascular testing and they did not see anything concerning. I have been elevating, exercising, taking motrin , decreasing my salt intake, and wearing my compression garments daily to manage my swelling.   PERTINENT HISTORY:  Hx of metastatic melanoma beginning in 2009 with lesion removed from RUE and ALND, has nodes removed from L axilla previously as well, 2014 metastasized to brain and underwent surgery and radiation, tumor removed from R groin in  2016 or 2017 (pt unable to recall) and 2 nodes removed, R THA 05/18/22, PMH includes but is not limited to: anxiety, asthma, DM-II, HTN, CVA, DVT, POTS, pacemaker, malignant melanoma with brain, liver and lung metastasis s/p radiation and L parietal craniotomy, osteopenia, diverticulitis, GERD, and Raynaud's disease.  PAIN:  Are you having pain? No just discomfort in LLE  PRECAUTIONS: Anterior hip and ICD/Pacemaker  WEIGHT BEARING RESTRICTIONS: No  FALLS:  Has patient fallen in last 6 months? 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  LEISURE: walks 3-4 miles several times a week, does all the hip strengthening exercises from rehab  HAND DOMINANCE: right   PRIOR LEVEL OF FUNCTION: Independent except when getting something off the floor  PATIENT GOALS: to go back to normal, decrease swelling and pain   OBJECTIVE:  COGNITION: Overall cognitive status: Within functional limits for tasks assessed   PALPATION: Fibrosis near lateral hip in area of edema  OBSERVATIONS / OTHER ASSESSMENTS: R LE visibly more full from knee to groin compared to L LE   UPPER EXTREMITY AROM/PROM: [facility]    LYMPHEDEMA ASSESSMENTS:   SURGERY TYPE/DATE: numerous surgeries, R ALND in  2009, surgery to remove R groin tumor in 2016 or 2017  NUMBER OF LYMPH NODES REMOVED: ALND in 2009, 2 nodes in 2016/2017 from R groin  RADIATION:completed to brain   LYMPHEDEMA ASSESSMENTS:   LANDMARK RIGHT  eval RIGHT 11/21/22  10 cm proximal to olecranon process 24.6   Olecranon process 24   10 cm proximal to ulnar styloid process 20.7   Just proximal to ulnar styloid process 14.7   Across hand at thumb web space 17.7   At base of 2nd digit 5.7   (Blank rows = not tested)  LANDMARK LEFT  eval   10 cm proximal to olecranon process 24.7   Olecranon process 24   10 cm proximal to ulnar styloid process 19.9   Just proximal to ulnar styloid process 14.5   Across hand at thumb web space 17.8   At base of 2nd digit 5.9   (Blank rows = not tested)   LOWER EXTREMITY LANDMARK RIGHT eval RIGHT 09/05/22 RIGHT  09/12/22 RIGHT 11/21/22  At groin      30 cm proximal to suprapatella 59.4 58 59 59.5  20 cm proximal to suprapatella 56 55.5 57.5 55  10 cm proximal to suprapatella 45.5 45.2 47 45.7  At midpatella / popliteal crease 38 38 38 39  30 cm proximal to floor at lateral plantar foot 40.4 39.7 40.4 40  20 cm proximal to floor at lateral plantar foot 31.2 31.1 30.2 29.9  10 cm proximal to floor at lateral plantar foot  21.7 21.6 22 21.6  Circumference of ankle/heel      5 cm proximal to 1st MTP joint 22.1 22.4 22.5 21.5  Across MTP joint 23.5 21.8 23.1 22.9  Around proximal great toe 7.9 7.3 7.8 7.4  (Blank rows = not tested)  LOWER EXTREMITY LANDMARK LEFT eval LEFT 09/12/22 LEFT 11/21/22  At groin     30 cm proximal to suprapatella 56 55.8 56.1   20 cm proximal to suprapatella 52.3 53 51.5  10 cm proximal to suprapatella 44.9 46 44  At midpatella / popliteal crease 38.4 38.1 37.1  30 cm proximal to floor at lateral plantar foot 38.5 39.5 39  20 cm proximal to floor at lateral plantar foot 30 30 28.6  10 cm proximal to floor at lateral plantar foot 21.5 22.2 21.5   Circumference of ankle/heel     5 cm proximal to 1st MTP joint 21.9 23 21.6  Across MTP joint 22.3 23.9 21.2  Around proximal great toe 7 7.2 7.1  (Blank rows = not tested)  LLIS: 72  TODAY'S TREATMENT:                                                                                                                                         DATE:  11/21/22:  Remeasured bilateral LE circumferences Discussed differences between basic pump and Flexi Touch, expressed to pt concern over basic pump pushing fluid towards R groin where she already has increased fluid  10/02/22: Continued MLD to R LE and instructed pt: Manual lymph drainage in supine: short neck, 5 diaphragmatic breaths, right inguinal nodes INTACT pathway, right lower extremity working proximal and moving distally then retracing steps 09/24/22:  See assessment for more detail Educated pt on compression bandaging/pump and possible active cancer and why this is contraindicated Discussed onset of LLE edema and possibly cardiac causes (pt has pacemaker)  09/12/22: Began MLD to R LE and instructed pt throughout and issued handout as follows: Manual lymph drainage in supine: short neck, 5 diaphragmatic breaths, right inguinal nodes INTACT pathway (see assessment), right lower extremity from toes and dorsal foot to lateral hip redirecting along pathway.  Did not perform bandaging due to fear of falling, pt has a doctor's appointment tomorrow and will be alone and due to recent onset of LLE edema with no known etiology and pt has hx of pacemaker - see assessment  09/10/22:  Pt arrived with bandages off due to fear of falling when by herself with bandages on Began MLD to R LE and instructed pt throughout and issued handout as follows: Manual lymph drainage in supine: short neck, 5 diaphragmatic breaths, left inguinal nodes and establishment of interinguinal pathway, right lower extremity from toes and dorsal foot to lateral hip redirecting along  pathway.   09/05/22: Pt arrived with bandages off due to taking a shower and her husband was not home to reapply She  reports she is fearful of falling with the bandages and did not bring her post op shoe to wear after today's appt so she requested to hold bandaging and her husband will apply them tonight Measured UE circumferences for baseline since pt is at risk for R UE lymphedema Remeasured R LE circumferences and she demonstrates good reduction at foot at groin Measured pt for flat knit compression thigh high for long term day time management - ExoStrong Medium Avg Thigh high with silicone border Discussed how to manage lymphedema long term, answered pt's questions and discussed need for compression during waking hours because the swelling will return  09/03/22: Began instructing pt and spouse in compression bandaging technique to RLE as follows and pt's spouse took a video on his phone to practice at home: TG soft small from foot to knee, TG soft medium from knee the thigh, Elastomull to digits 1-4, artiflex from foot to knee and then from knee to thigh, 1 6 cm bandage at foot, 1 8 cm heel/ankle/sole, 1 10 cm from ankle to knee, 1 12 cm bandage from mid lower leg to above knee with x at knee, 1 12 cm bandage from knee to groin in herringbone, 1 12 cm bandage from above knee to groin. Educated pt to wear her spanx compression shorts on top to provide compression to her hip where she has increased swelling. Put shoe covers on R foot and she is going to get a post op shoe at [facility] after this appointment. Educated pt in walking with cane with bandages intact for balance. Practiced ascending/descending stairs with cane and bandages intact to ensure pt felt safe. She did well with all. Will have pt's spouse return demo bandaging at next session.   08/31/22: Educated pt and spouse on anatomy and physiology of the lymphatic system and what to expect with CDT, educated to purchase post op shoe to wear  with bandaging, educated to decrease exercise some due to pain in evening after walking 3-4 miles as a result of edema  PATIENT EDUCATION:  Education details: compression bandaging technique, remove bandages if foot turns blue or becomes numb and does not improve with movement, encouraged to move and exercise with bandages intact Person educated: Patient and Spouse Education method: Explanation Education comprehension: verbalized understanding  HOME EXERCISE PROGRAM: Wear bike shorts for hip swelling Wear compression leggings when exercising  ASSESSMENT:  CLINICAL IMPRESSION: Pt reports her doctor believes she has lymphedema in bilateral LEs. All of her tests performed did not indicate any type of cardiac based swelling. She did have nodes removed from her abdomen during a previous surgery and she had 2 nodes removed from R groin when the mass was removed in 2016. She does have a prior history of ALND in her R axilla but that was in 2009. She would benefit from a FlexiTouch compression pump to help her manage her bilateral LE lymphedema. She reports her lymphedema fluctuates despite elevation, exercise, self MLD and wearing compression garments. She is currently having to take furosemide  to manage her swelling. While overall her bilateral LE is down from her last visit she has had an increase in swelling at her R lateral hip due to the swelling being pushed upwards to this spot by her compression garment. She is no longer able to walk daily but has to wait two days in between to prevent worsening of lymphedema. She reports the furosemide  makes her more tired and she feels dehydrated. She is also hoping to stop  it since they are having to monitor her kidney function.   OBJECTIVE IMPAIRMENTS: decreased knowledge of condition, decreased knowledge of use of DME, increased edema, and pain.   ACTIVITY LIMITATIONS: squatting and sleeping  PARTICIPATION LIMITATIONS: community activity and  exercise  PERSONAL FACTORS: Past/current experiences, Time since onset of injury/illness/exacerbation, and 1 comorbidity: history of R ALND are also affecting patient's functional outcome.   REHAB POTENTIAL: Good  CLINICAL DECISION MAKING: Stable/uncomplicated  EVALUATION COMPLEXITY: Low  GOALS: Goals reviewed with patient? Yes  SHORT TERM GOALS: Target date: 09/28/22  Pt and/or spouse will be independent with compression bandaging for long term management of lymphedema.  Baseline: Goal status: REVISED DISCONTINUED - bandaging had to be stopped due to increased fall risk with bandages and leg length discrepancy   2.  Pt and/or spouse will be independent in self MLD for long term management of lymphedema.  Baseline:  Goal status: IN PROGRESS  3.  Pt will demonstrate a 1 cm decrease in edema at 30 cm superior to patella to decrease risk of infection.  Baseline:  Goal status: IN PROGRESS 11/21/22- no change in swelling 30 cm superior to patella but has decreased at every other spot    LONG TERM GOALS: Target date: 10/12/22  Pt will obtain appropriate compression garments for RLE lymphedema and for prophylactic use on RUE.  Baseline:  Goal status: MET 11/21/22- she has received appropriate compression garments and has been using them   2.  Pt will be able to sleep through the night after exercising without increase LE discomfort from lymphedema.  Baseline:  Goal status: IN PROGRESS 11/21/22   PLAN:  PT FREQUENCY: 1x in the next 8 weeks to remeasure  PT DURATION: 8 weeks  PLANNED INTERVENTIONS: Therapeutic exercises, Therapeutic activity, Patient/Family education, Self Care, Joint mobilization, Orthotic/Fit training, Manual lymph drainage, Compression bandaging, scar mobilization, Taping, Vasopneumatic device, Manual therapy, and Re-evaluation  PLAN FOR NEXT SESSION: remeasure after she has used pump for 4 weeks   [employer], PT 11/21/2022, 4:00 PM  PHYSICAL  THERAPY DISCHARGE SUMMARY  Visits from Start of Care: 7  Current functional level related to goals / functional outcomes: See above   Remaining deficits: See above   Education / Equipment: Self MLD, lymphedema, compression garments   Patient agrees to discharge. Patient goals were partially met. Patient is being discharged due to not returning since the last visit.  Florina Sever [place], [place] 09/19/23 11:57 AM

## 2022-11-22 ENCOUNTER — Encounter: Payer: Self-pay | Admitting: Physical Therapy

## 2022-11-22 ENCOUNTER — Ambulatory Visit: Payer: Medicare Other | Admitting: Physical Therapy

## 2022-11-22 DIAGNOSIS — M6281 Muscle weakness (generalized): Secondary | ICD-10-CM | POA: Diagnosis not present

## 2022-11-22 DIAGNOSIS — M25552 Pain in left hip: Secondary | ICD-10-CM

## 2022-11-22 DIAGNOSIS — I890 Lymphedema, not elsewhere classified: Secondary | ICD-10-CM | POA: Diagnosis not present

## 2022-11-22 DIAGNOSIS — R2681 Unsteadiness on feet: Secondary | ICD-10-CM | POA: Diagnosis not present

## 2022-11-22 NOTE — Therapy (Addendum)
OUTPATIENT PHYSICAL THERAPY TREATMENT   Patient Name: [name] MRN: [id number] DOB:[birth year], 67 y.o., female Today's Date: 11/22/2022  END OF SESSION:  PT End of Session - 11/22/22 1324     Visit Number 7   Number of Visits    Date for PT Re-Evaluation 01/16/23    Authorization Type [employer] Medicare    PT Start Time 1322    PT Stop Time 1401    PT Time Calculation (min) 39 min    Activity Tolerance Patient tolerated treatment well    Behavior During Therapy WFL for tasks assessed/performed             Past Medical History:  Diagnosis Date   Anemia    Angio-edema    Anxiety    Arthritis    Asthma    Atrial fibrillation (HCC)    Atrial tachycardia    Autonomic dysfunction    Complication of anesthesia    pt states wakes up with "shakes"   CVA (cerebral infarction)    2012 with dizziness and vision change felt embolic from atrial tachy   Disorder of bone and cartilage, unspecified    Diverticulosis    DM (diabetes mellitus) (HCC)    DVT (deep venous thrombosis) (HCC)    Eczema    Family history of anesthesia complication    PONV and " shaking "   Fatty liver    GERD (gastroesophageal reflux disease)    History of hiatal hernia    History of radiation therapy 10/24/2012   brain   HLD (hyperlipidemia)    HTN (hypertension)    Hypoglycemia, unspecified    Liver metastasis    Lung metastasis    Metastasis to brain (HCC)    Osteopenia    Other nonspecific abnormal serum enzyme levels    Other specified congenital anomalies of nervous system    Pacemaker    autonomic dysfunction   Pancreatitis    from therapy   Peripheral vascular disease (HCC)    POTS (postural orthostatic tachycardia syndrome)    Raynaud's disease    due to chemo   Skin cancer    Hx: of lung lesion   Past Surgical History:  Procedure Laterality Date   ABLATION SAPHENOUS VEIN W/ RFA     2002 x3   CARPAL TUNNEL RELEASE Left 2000   CHOLECYSTECTOMY N/A 03/28/2018   Procedure:  LAPAROSCOPIC CHOLECYSTECTOMY WITH INTRAOPERATIVE CHOLANGIOGRAM;  Surgeon: Luretha Murphy, MD;  Location: [facility] OR;  Service: General;  Laterality: N/A;   CRANIOTOMY N/A 09/30/2012   suboccipital craniectomy   CRANIOTOMY Left 02/09/2013   Procedure: LEFT PARIETAL CRANIOTOMY with stealth;  Surgeon: Barnett Abu, MD;  Location: [facility];  Service: Neurosurgery;  Laterality: Left;  LEFT Parietal Craniotomy for tumor with stealth   DEEP AXILLARY SENTINEL NODE BIOPSY / EXCISION     due to extensive Melanoma-right arm   fatty tumor removed  2000   from chest   INSERT / REPLACE / REMOVE PACEMAKER  2012   1999, x 3   LAPAROSCOPY  09/06/2011   Procedure: LAPAROSCOPY OPERATIVE;  Surgeon: Tresa Endo A. Ernestina Penna, MD;  Location: [facility];  Service: Gynecology;  Laterality: Left;  with Left Ovarian Cystectomy    MELANOMA EXCISION     with removal of lymph nodes, left shoulder   NOSE SURGERY  2010, 2012   for nose bleeds x 2   SINOSCOPY     TONSILLECTOMY AND ADENOIDECTOMY     TOTAL HIP ARTHROPLASTY Right 05/18/2022  Procedure: RIGHT TOTAL HIP ARTHROPLASTY ANTERIOR APPROACH;  Surgeon: Kathryne Hitch, MD;  Location: [facility];  Service: Orthopedics;  Laterality: Right;   Patient Active Problem List   Diagnosis Date Noted   Status post total replacement of right hip 05/18/2022   Avascular necrosis of bone of right hip (HCC) 04/19/2022   Moderate persistent asthma 12/08/2018   Perennial allergic rhinitis with predominantly nonallergic component 12/08/2018   Allergic conjunctivitis 12/08/2018   History of epistaxis 12/08/2018   Atopic dermatitis 12/08/2018   Recurrent urticaria 12/08/2018   Skeeter syndrome 12/08/2018   Skin lesion of chest wall 07/22/2018   S/P laparoscopic cholecystectomy 03/28/2018   Dehydration 05/18/2016   Type 2 diabetes mellitus without complication, without long-term current use of insulin (HCC) 11/01/2015   History of pacemaker    POTS (postural orthostatic tachycardia  syndrome)    Iron deficiency anemia 11/11/2014   Chronic colitis 08/28/2013   Malignant melanoma, metastatic (HCC) 02/09/2013   Malignant melanoma (HCC) 12/01/2012   Brain metastasis 10/16/2012   Hypertension 09/30/2012   Anxiety state, unspecified 09/30/2012   Hypoxemia 09/30/2012   Disorder of brain 09/08/2012   Dysautonomia (HCC) 02/25/2011   Anticoagulant long-term use 01/19/2011   Neuritis 01/19/2011   Skin change 01/19/2011   CVA (cerebral infarction)    Palpitation 06/28/2010   Pacemaker-Biotronik 06/28/2010   Dizziness and giddiness 04/27/2010   DIVERTICULOSIS, COLON, HX OF 01/19/2009   CAROTID SINUS SYNDROME 11/17/2008   OTHER MALAISE AND FATIGUE 11/17/2008   HYPERTENSION, BENIGN 11/02/2008   ATRIAL TACHYCARDIA 09/16/2008   SYNCOPE, HX OF 08/09/2008   INJURY UNSPEC NERVE SHOULDER GIRDLE&UPPER LIMB 07/12/2008   PERSONAL HISTORY OF MALIGNANT MELANOMA OF SKIN 07/12/2008   LUQ PAIN 12/08/2007   NECK PAIN 09/26/2007   NUMBNESS, ARM 09/26/2007   CPK, ABNORMAL 09/26/2007   ARM PAIN, LEFT 06/25/2007   SWELLING OF LIMB 06/25/2007   OSTEOPENIA 06/25/2007    PCP: Madelin Headings, MD   REFERRING PROVIDER: Kathryne Hitch, MD  REFERRING DIAG: [phone] (ICD-10-CM) - It band syndrome, left  THERAPY DIAG:  Pain in left hip  Muscle weakness (generalized)  Unsteadiness on feet  Rationale for Evaluation and Treatment: Rehabilitation  ONSET DATE:  2-3 months (prior to IE)  NEXT MD VISIT: 11/14/22   SUBJECTIVE:   SUBJECTIVE STATEMENT: [name] reports she went to lymphedema specialist yesterday, concerns about swelling on R side.  R leg bothering her more than L today.   PERTINENT HISTORY:  anxiety, asthma, DM-II, HTN, CVA, DVT, POTS, pacemaker, malignant melanoma with brain, liver and lung metastasis s/p radiation and L parietal craniotomy, osteopenia, diverticulitis, GERD, and Raynaud's disease. R THA 05/19/22 PAIN:  Are you having pain? Yes: NPRS  scale: 1/10 Pain location: L hip pain Pain description: aching, sharp when laying on it Aggravating factors: laying down, sitting prolonged periods Relieving factors: getting up  PRECAUTIONS: ICD/Pacemaker and Other: POTS, advised to stay out of heat, has LE lymphedema  RED FLAGS: None   WEIGHT BEARING RESTRICTIONS: No  FALLS:  Has patient fallen in last 6 months? No  LIVING ENVIRONMENT: Lives with: lives with their spouse Lives in: House/apartment Stairs: Yes: Internal: 13 steps; on right going up, on left going up, and can reach both Has following equipment at home: Single point cane, Walker - 2 wheeled, and shower chair  OCCUPATION: medically disabled due to cancer.   PLOF: Independent walking 2-3 miles, 3-4x/week  PATIENT GOALS: be able to get up from floor, work on balance,  decrease L hip pain.     OBJECTIVE:   DIAGNOSTIC FINDINGS: 10/08/2022 XR Bilateral Hips An AP pelvis and lateral both hips shows a well-seated right total hip  arthroplasty.  There is no evidence of AVN with the left hip.   PATIENT SURVEYS:  LEFS 46/80  COGNITION: Overall cognitive status: Within functional limits for tasks assessed     SENSATION: Neuropathy bil feet  EDEMA:  History bil LE edema- being treated at lymphedema clinic  MUSCLE LENGTH: Hamstrings: moderate tightness bilaterally  POSTURE:  weight shift left and leg length discrepancy with R LE longer than L  PALPATION: Tenderness over L glutes, L piriforomis, L greater trochanter, along L Itband.   LOWER EXTREMITY ROM:  Active ROM Right eval Left eval  Hip flexion 140 140  Hip extension    Hip internal rotation 25 25  Hip external rotation 60 60   (Blank rows = not tested)  LOWER EXTREMITY MMT:  MMT Right eval Left eval  Hip flexion 5 5  Hip extension 5 4  Hip abduction 5  5   Hip adduction 5(seated) 5 (seated)  Hip internal rotation    Hip external rotation    Knee flexion 5 5  Knee extension 5 5  Ankle  dorsiflexion 5 5  Ankle plantarflexion 5 5   (Blank rows = not tested)  LOWER EXTREMITY SPECIAL TESTS:  Hip special tests: Ober's test: positive for pain  FUNCTIONAL TESTS:  5 times sit to stand: 14.5 sec  DGI - 24/24  GAIT: Distance walked: 50 Assistive device utilized: None Level of assistance: Complete Independence Comments: visually slow gait speed.    TODAY'S TREATMENT:                                                                                                                              DATE:   11/22/22 Therapeutic Exercise: to improve strength and mobility.  Demo, verbal and tactile cues throughout for technique. Nustep L5 x 6 min  Clock exercise - forward, side and cross 3 x 5 each leg - reporting fatigue and R hip pain afterwards.   Manual Therapy: to decrease muscle spasm and pain and improve mobility STM/TPR and IASTM with s/s tool to R hip - focusing on R hip flexor and ITband  11/19/22 Therapeutic Exercise: to improve strength and mobility.  Demo, verbal and tactile cues throughout for technique. Bike L2 x 6 min  Pilates lunges with sliding opposite foot on towel - at counter for safety - side x 10 each, back x 10 each, forward x 10 each  Kneel to tall kneel x 10 Hip 90/90's x 10 each  Transitioning from 90/90 position to quadruped (just lifting hip) 2 x 3 each side) Long sit hip flexor lifts over box 3 x 5 each side Manual Therapy: to decrease muscle spasm and pain and improve mobility IASTM with foam roller to L glutes, proximal hamstrings, TFL, IASTM with s/s tool  to Itband especially distal and lateral hamstrings, STM/TPR to piriformis and glutes.   11/15/2022 Manual Therapy: to decrease muscle spasm and pain and improve mobility IASTM with foam roller to L glutes, proximal hamstrings, TFL, IASTM with s/s tool to Itband especially distal and lateral hamstrings, STM/TPR to piriformis and glutes.    PATIENT EDUCATION:  Education details: HEP update Person  educated: Patient Education method: Explanation, Demonstration, Verbal cues, and Handouts Education comprehension: verbalized understanding and returned demonstration  HOME EXERCISE PROGRAM: Access Code: [email or web address] URL: [email or web address] Date: 11/22/2022 Prepared by: Harrie Foreman  Exercises - Supine ITB Stretch  - 1 x daily - 7 x weekly - 1 sets - 3 reps - 30 sec  hold - Supine Hip Internal and External Rotation  - 1 x daily - 7 x weekly - 2 sets - 10 reps - Supine Bridge with Mini Swiss Ball Between Knees  - 1 x daily - 7 x weekly - 2 sets - 10 reps - Prone Hip Extension with Bent Knee  - 1 x daily - 7 x weekly - 2 sets - 10 reps - ITB Stretch at Wall  - 1 x daily - 7 x weekly - 1 sets - 3 reps - 30 sec hold - Standing Hip Flexor Stretch  - 1 x daily - 7 x weekly - 1 sets - 3 reps - 30 sec hold - Squat with Chair Touch  - 3 x daily - 7 x weekly - 2 sets - 5 reps - Lunge Matrix on Slider  - 1 x daily - 7 x weekly - 2-3 sets - 10 reps - Single Leg Balance with Clock Reach  - 1 x daily - 7 x weekly - 3 sets - 5 reps   ASSESSMENT:  CLINICAL IMPRESSION: Rechy reports L hip pain is improving.  Continued working on balance and LE strengthening today with clock exercise, very challenging but only minor LOB, which she was able to correct on own.  Added to HEP but to perform next to counter for safety (as we performed her in clinic).  She did report some R hip pain after exercise, but decreased after manual therapy.   Maite will continue to benefit from skilled PT to address above deficits to improve mobility and activity tolerance with decreased pain interference.    OBJECTIVE IMPAIRMENTS: Abnormal gait, decreased activity tolerance, decreased balance, decreased endurance, decreased mobility, difficulty walking, decreased strength, impaired flexibility, and pain.   ACTIVITY LIMITATIONS: carrying, lifting, bending, standing, squatting, sleeping, and locomotion  level  PARTICIPATION LIMITATIONS: meal prep, cleaning, laundry, and shopping  PERSONAL FACTORS: 3+ comorbidities: anxiety, asthma, DM-II, HTN, CVA, DVT, POTS, pacemaker, malignant melanoma with brain, liver and lung metastasis s/p radiation and L parietal craniotomy, osteopenia, diverticulitis, GERD, and Raynaud's disease. R THA 05/19/22  are also affecting patient's functional outcome.   REHAB POTENTIAL: Good  CLINICAL DECISION MAKING: Evolving/moderate complexity  EVALUATION COMPLEXITY: Moderate   GOALS: Goals reviewed with patient? Yes  SHORT TERM GOALS: Target date: 11/07/2022   Patient will be independent with initial HEP. Baseline: given 11/01/22 Goal status: MET 11/15/22  2.  Patient will undergo further balance testing Baseline:  Goal status: MET 11/01/22 - competed DGI  LONG TERM GOALS: Target date: 12/19/2022   Patient will be independent with advanced/ongoing HEP to improve outcomes and carryover.  Baseline:  Goal status: IN PROGRESS  2.  Patient will report at least 75% improvement in Left hip pain to improve QOL. Baseline:  Goal status: IN  PROGRESS  3.  Patient will demonstrate improved functional LE strength as demonstrated by 5x STS <14 seconds to decrease fall risk. Baseline: NT Goal status: IN PROGRESS  4.  Patient will report at least 9 points improvement on LEFS to demonstrate improved functional ability. Baseline: 46/80 Goal status: IN PROGRESS  5.  Patient will demonstrate at least 19/24 on DGI to decrease risk of falls. Baseline: NT Goal status: MET 11/01/22- 24/24  6.  Patient will be able to perform floor to stand transfer without use of furniture to assist safely.  Baseline: gets "stuck" when squatting down to pick up item, has to crawl to get back up.  Goal status: IN PROGRESS 11/19/22- able to perform with supervision today.      PLAN:  PT FREQUENCY: 1-2x/week  PT DURATION: 8 weeks  PLANNED INTERVENTIONS: Therapeutic exercises, Therapeutic  activity, Neuromuscular re-education, Balance training, Gait training, Patient/Family education, Self Care, Joint mobilization, Stair training, Orthotic/Fit training, Dry Needling, Spinal mobilization, Moist heat, Taping, Traction, Manual therapy, and Re-evaluation  PLAN FOR NEXT SESSION: practice floor transfers per pt request, continue manual and TrDN, review and progress HEP for LE strengthening and balance.    Jena Gauss, PT, DPT  11/22/2022, 6:07 PM

## 2022-11-26 ENCOUNTER — Ambulatory Visit: Payer: Medicare Other | Admitting: Physical Therapy

## 2022-11-26 ENCOUNTER — Encounter: Payer: Self-pay | Admitting: Physical Therapy

## 2022-11-26 DIAGNOSIS — R2681 Unsteadiness on feet: Secondary | ICD-10-CM | POA: Diagnosis not present

## 2022-11-26 DIAGNOSIS — M6281 Muscle weakness (generalized): Secondary | ICD-10-CM | POA: Diagnosis not present

## 2022-11-26 DIAGNOSIS — M25552 Pain in left hip: Secondary | ICD-10-CM | POA: Diagnosis not present

## 2022-11-26 NOTE — Therapy (Signed)
OUTPATIENT PHYSICAL THERAPY TREATMENT   Patient Name: [name] MRN: [id number] DOB:18-Jun-1955, 67 y.o., female Today's Date: 11/26/2022  END OF SESSION:  PT End of Session - 11/26/22 1355     Visit Number 8   For ITB POC   Number of Visits --    Date for PT Re-Evaluation 01/16/23    Authorization Type [employer] Medicare    PT Start Time 1355    PT Stop Time 1445    PT Time Calculation (min) 50 min    Activity Tolerance Patient tolerated treatment well    Behavior During Therapy WFL for tasks assessed/performed              Past Medical History:  Diagnosis Date   Anemia    Angio-edema    Anxiety    Arthritis    Asthma    Atrial fibrillation (HCC)    Atrial tachycardia    Autonomic dysfunction    Complication of anesthesia    pt states wakes up with "shakes"   CVA (cerebral infarction)    2012 with dizziness and vision change felt embolic from atrial tachy   Disorder of bone and cartilage, unspecified    Diverticulosis    DM (diabetes mellitus) (HCC)    DVT (deep venous thrombosis) (HCC)    Eczema    Family history of anesthesia complication    PONV and " shaking "   Fatty liver    GERD (gastroesophageal reflux disease)    History of hiatal hernia    History of radiation therapy 10/24/2012   brain   HLD (hyperlipidemia)    HTN (hypertension)    Hypoglycemia, unspecified    Liver metastasis    Lung metastasis    Metastasis to brain (HCC)    Osteopenia    Other nonspecific abnormal serum enzyme levels    Other specified congenital anomalies of nervous system    Pacemaker    autonomic dysfunction   Pancreatitis    from therapy   Peripheral vascular disease (HCC)    POTS (postural orthostatic tachycardia syndrome)    Raynaud's disease    due to chemo   Skin cancer    Hx: of lung lesion   Past Surgical History:  Procedure Laterality Date   ABLATION SAPHENOUS VEIN W/ RFA     2002 x3   CARPAL TUNNEL RELEASE Left 2000   CHOLECYSTECTOMY N/A  03/28/2018   Procedure: LAPAROSCOPIC CHOLECYSTECTOMY WITH INTRAOPERATIVE CHOLANGIOGRAM;  Surgeon: Luretha Murphy, MD;  Location: [facility];  Service: General;  Laterality: N/A;   CRANIOTOMY N/A 09/30/2012   suboccipital craniectomy   CRANIOTOMY Left 02/09/2013   Procedure: LEFT PARIETAL CRANIOTOMY with stealth;  Surgeon: Barnett Abu, MD;  Location: [facility];  Service: Neurosurgery;  Laterality: Left;  LEFT Parietal Craniotomy for tumor with stealth   DEEP AXILLARY SENTINEL NODE BIOPSY / EXCISION     due to extensive Melanoma-right arm   fatty tumor removed  2000   from chest   INSERT / REPLACE / REMOVE PACEMAKER  2012   1999, x 3   LAPAROSCOPY  09/06/2011   Procedure: LAPAROSCOPY OPERATIVE;  Surgeon: Tresa Endo A. Ernestina Penna, MD;  Location: [facility];  Service: Gynecology;  Laterality: Left;  with Left Ovarian Cystectomy    MELANOMA EXCISION     with removal of lymph nodes, left shoulder   NOSE SURGERY  2010, 2012   for nose bleeds x 2   SINOSCOPY     TONSILLECTOMY AND ADENOIDECTOMY  TOTAL HIP ARTHROPLASTY Right 05/18/2022   Procedure: RIGHT TOTAL HIP ARTHROPLASTY ANTERIOR APPROACH;  Surgeon: Kathryne Hitch, MD;  Location: [facility];  Service: Orthopedics;  Laterality: Right;   Patient Active Problem List   Diagnosis Date Noted   Status post total replacement of right hip 05/18/2022   Avascular necrosis of bone of right hip (HCC) 04/19/2022   Moderate persistent asthma 12/08/2018   Perennial allergic rhinitis with predominantly nonallergic component 12/08/2018   Allergic conjunctivitis 12/08/2018   History of epistaxis 12/08/2018   Atopic dermatitis 12/08/2018   Recurrent urticaria 12/08/2018   Skeeter syndrome 12/08/2018   Skin lesion of chest wall 07/22/2018   S/P laparoscopic cholecystectomy 03/28/2018   Dehydration 05/18/2016   Type 2 diabetes mellitus without complication, without long-term current use of insulin (HCC) 11/01/2015   History of pacemaker    POTS (postural  orthostatic tachycardia syndrome)    Iron deficiency anemia 11/11/2014   Chronic colitis 08/28/2013   Malignant melanoma, metastatic (HCC) 02/09/2013   Malignant melanoma (HCC) 12/01/2012   Brain metastasis 10/16/2012   Hypertension 09/30/2012   Anxiety state, unspecified 09/30/2012   Hypoxemia 09/30/2012   Disorder of brain 09/08/2012   Dysautonomia (HCC) 02/25/2011   Anticoagulant long-term use 01/19/2011   Neuritis 01/19/2011   Skin change 01/19/2011   CVA (cerebral infarction)    Palpitation 06/28/2010   Pacemaker-Biotronik 06/28/2010   Dizziness and giddiness 04/27/2010   DIVERTICULOSIS, COLON, HX OF 01/19/2009   CAROTID SINUS SYNDROME 11/17/2008   OTHER MALAISE AND FATIGUE 11/17/2008   HYPERTENSION, BENIGN 11/02/2008   ATRIAL TACHYCARDIA 09/16/2008   SYNCOPE, HX OF 08/09/2008   INJURY UNSPEC NERVE SHOULDER GIRDLE&UPPER LIMB 07/12/2008   PERSONAL HISTORY OF MALIGNANT MELANOMA OF SKIN 07/12/2008   LUQ PAIN 12/08/2007   NECK PAIN 09/26/2007   NUMBNESS, ARM 09/26/2007   CPK, ABNORMAL 09/26/2007   ARM PAIN, LEFT 06/25/2007   SWELLING OF LIMB 06/25/2007   OSTEOPENIA 06/25/2007    PCP: Madelin Headings, MD   REFERRING PROVIDER: Kathryne Hitch, MD  REFERRING DIAG: [phone] (ICD-10-CM) - It band syndrome, left  THERAPY DIAG:  Pain in left hip  Muscle weakness (generalized)  Unsteadiness on feet  Rationale for Evaluation and Treatment: Rehabilitation  ONSET DATE:  2-3 months (prior to IE)  NEXT MD VISIT: 02/13/23   SUBJECTIVE:   SUBJECTIVE STATEMENT: Charisma reports the shot (greater trochanter) for her L leg is really helping but the R leg (anterior thigh) is bothering her more.  PERTINENT HISTORY:  anxiety, asthma, DM-II, HTN, CVA, DVT, POTS, pacemaker, malignant melanoma with brain, liver and lung metastasis s/p radiation and L parietal craniotomy, osteopenia, diverticulitis, GERD, and Raynaud's disease. R THA 05/19/22 PAIN:  Are you having pain? Yes:  NPRS scale: 8/10 Pain location: R anterior thigh Pain description: pulling & aching Aggravating factors: walking Relieving factors: rest  PRECAUTIONS: ICD/Pacemaker and Other: POTS, advised to stay out of heat, has LE lymphedema  RED FLAGS: None   WEIGHT BEARING RESTRICTIONS: No  FALLS:  Has patient fallen in last 6 months? No  LIVING ENVIRONMENT: Lives with: lives with their spouse Lives in: House/apartment Stairs: Yes: Internal: 13 steps; on right going up, on left going up, and can reach both Has following equipment at home: Single point cane, Walker - 2 wheeled, and shower chair  OCCUPATION: medically disabled due to cancer.   PLOF: Independent walking 2-3 miles, 3-4x/week  PATIENT GOALS: be able to get up from floor, work on balance, decrease L hip pain.  OBJECTIVE:   DIAGNOSTIC FINDINGS: 10/08/2022 XR Bilateral Hips An AP pelvis and lateral both hips shows a well-seated right total hip  arthroplasty.  There is no evidence of AVN with the left hip.   PATIENT SURVEYS:  LEFS 46/80  COGNITION: Overall cognitive status: Within functional limits for tasks assessed     SENSATION: Neuropathy bil feet  EDEMA:  History bil LE edema- being treated at lymphedema clinic  MUSCLE LENGTH: Hamstrings: moderate tightness bilaterally  POSTURE:  weight shift left and leg length discrepancy with R LE longer than L  PALPATION: Tenderness over L glutes, L piriforomis, L greater trochanter, along L Itband.   LOWER EXTREMITY ROM:  Active ROM Right eval Left eval  Hip flexion 140 140  Hip extension    Hip internal rotation 25 25  Hip external rotation 60 60   (Blank rows = not tested)  LOWER EXTREMITY MMT:  MMT Right eval Left eval  Hip flexion 5 5  Hip extension 5 4  Hip abduction 5  5   Hip adduction 5 (seated) 5 (seated)  Hip internal rotation    Hip external rotation    Knee flexion 5 5  Knee extension 5 5  Ankle dorsiflexion 5 5  Ankle plantarflexion  5 5   (Blank rows = not tested)  LOWER EXTREMITY SPECIAL TESTS:  Hip special tests: Ober's test: positive for pain  FUNCTIONAL TESTS:  5 times sit to stand: 14.5 sec  DGI - 24/24  GAIT: Distance walked: 50 Assistive device utilized: None Level of assistance: Complete Independence Comments: visually slow gait speed.    TODAY'S TREATMENT:                                                                                                                              DATE:   11/26/22 THERAPEUTIC EXERCISE: to improve flexibility, strength and mobility.  Demonstration, verbal and tactile cues throughout for technique. Bike L2 x 6 min Mod thomas R quad/hip flexor stretch with strap 3 x 30"  Prone R quad/hip flexor stretch with roll under thigh and strap assist 3 x 30" - pt preferring over mod thomas  Standing hip hinge with pole along spine 2 x 5  MANUAL THERAPY: To promote normalized muscle tension, improved flexibility, increased ROM, and reduced pain.  STM and FR to R lateral quads and ITB Manual R ITB stretch in L S/L with R knee straight and slightly bent x 30" - better stretch noted with knee flexed   THERAPEUTIC ACTIVITIES: Sit to stand - working on International aid/development worker and balance: 1/2 kneel with knee on yoga block to knees straight with hands on mat table x 5 with each leg fwd 1/2 kneel with knee on Airex pad to knees straight with hands on mat table x 5 with each leg fwd 1/2 kneel with knee on Airex block to knees straight with ipsilateral hand on elevated knee and opp hand on yoga block on  floor x 5 with each leg fwd    11/22/22 Therapeutic Exercise: to improve strength and mobility.  Demo, verbal and tactile cues throughout for technique. Nustep L5 x 6 min  Clock exercise - forward, side and cross 3 x 5 each leg - reporting fatigue and R hip pain afterwards.   Manual Therapy: to decrease muscle spasm and pain and improve mobility STM/TPR and IASTM with s/s tool to R hip -  focusing on R hip flexor and ITband   11/19/22 Therapeutic Exercise: to improve strength and mobility.  Demo, verbal and tactile cues throughout for technique. Bike L2 x 6 min  Pilates lunges with sliding opposite foot on towel - at counter for safety - side x 10 each, back x 10 each, forward x 10 each  Kneel to tall kneel x 10 Hip 90/90's x 10 each  Transitioning from 90/90 position to quadruped (just lifting hip) 2 x 3 each side) Long sit hip flexor lifts over box 3 x 5 each side Manual Therapy: to decrease muscle spasm and pain and improve mobility IASTM with foam roller to L glutes, proximal hamstrings, TFL, IASTM with s/s tool to Itband especially distal and lateral hamstrings, STM/TPR to piriformis and glutes.    PATIENT EDUCATION:  Education details: HEP update Person educated: Patient Education method: Explanation, Demonstration, Verbal cues, and Handouts Education comprehension: verbalized understanding and returned demonstration  HOME EXERCISE PROGRAM: Access Code: [email or web address] URL: [email or web address] Date: 11/22/2022 Prepared by: Harrie Foreman  Exercises - Supine ITB Stretch  - 1 x daily - 7 x weekly - 1 sets - 3 reps - 30 sec  hold - Supine Hip Internal and External Rotation  - 1 x daily - 7 x weekly - 2 sets - 10 reps - Supine Bridge with Mini Swiss Ball Between Knees  - 1 x daily - 7 x weekly - 2 sets - 10 reps - Prone Hip Extension with Bent Knee  - 1 x daily - 7 x weekly - 2 sets - 10 reps - ITB Stretch at Wall  - 1 x daily - 7 x weekly - 1 sets - 3 reps - 30 sec hold - Standing Hip Flexor Stretch  - 1 x daily - 7 x weekly - 1 sets - 3 reps - 30 sec hold - Squat with Chair Touch  - 3 x daily - 7 x weekly - 2 sets - 5 reps - Lunge Matrix on Slider  - 1 x daily - 7 x weekly - 2-3 sets - 10 reps - Single Leg Balance with Clock Reach  - 1 x daily - 7 x weekly - 3 sets - 5 reps   ASSESSMENT:  CLINICAL IMPRESSION: Yukie reports continued good relief  from L greater trochanteric bursa injection but now c/o more R anterolateral thigh pain, mostly localized to lateral quads/VL. Addressed this with MT and stretching with some relief noted. She requested to continue to work on floor transfers w/o external support but struggled with full floor to stand via 1/2 kneel, therefore worked on components of the movement pattern to strengthen the involved musculature. She seemed frustrated by her inability to put it all back to together in a single visit, but explained to her that this a process. Zareya will continue to benefit from skilled PT to address above deficits to improve mobility and activity tolerance with decreased pain interference.    OBJECTIVE IMPAIRMENTS: Abnormal gait, decreased activity tolerance, decreased balance, decreased endurance, decreased mobility, difficulty walking, decreased  strength, impaired flexibility, and pain.   ACTIVITY LIMITATIONS: carrying, lifting, bending, standing, squatting, sleeping, and locomotion level  PARTICIPATION LIMITATIONS: meal prep, cleaning, laundry, and shopping  PERSONAL FACTORS: 3+ comorbidities: anxiety, asthma, DM-II, HTN, CVA, DVT, POTS, pacemaker, malignant melanoma with brain, liver and lung metastasis s/p radiation and L parietal craniotomy, osteopenia, diverticulitis, GERD, and Raynaud's disease. R THA 05/19/22  are also affecting patient's functional outcome.   REHAB POTENTIAL: Good  CLINICAL DECISION MAKING: Evolving/moderate complexity  EVALUATION COMPLEXITY: Moderate   GOALS: Goals reviewed with patient? Yes  SHORT TERM GOALS: Target date: 11/07/2022   Patient will be independent with initial HEP. Baseline: given 11/01/22 Goal status: MET  11/15/22  2.  Patient will undergo further balance testing Baseline:  Goal status: MET  11/01/22 - competed DGI  LONG TERM GOALS: Target date: 12/19/2022   Patient will be independent with advanced/ongoing HEP to improve outcomes and carryover.   Baseline:  Goal status: IN PROGRESS  2.  Patient will report at least 75% improvement in Left hip pain to improve QOL. Baseline:  Goal status: IN PROGRESS  3.  Patient will demonstrate improved functional LE strength as demonstrated by 5x STS <14 seconds to decrease fall risk. Baseline: NT Goal status: IN PROGRESS  4.  Patient will report at least 9 points improvement on LEFS to demonstrate improved functional ability. Baseline: 46/80 Goal status: IN PROGRESS  5.  Patient will demonstrate at least 19/24 on DGI to decrease risk of falls. Baseline: NT Goal status: MET  11/01/22 - 24/24  6.  Patient will be able to perform floor to stand transfer without use of furniture to assist safely.  Baseline: gets "stuck" when squatting down to pick up item, has to crawl to get back up.  Goal status: IN PROGRESS  11/19/22- able to perform with supervision today.    PLAN:  PT FREQUENCY: 1-2x/week  PT DURATION: 8 weeks  PLANNED INTERVENTIONS: Therapeutic exercises, Therapeutic activity, Neuromuscular re-education, Balance training, Gait training, Patient/Family education, Self Care, Joint mobilization, Stair training, Orthotic/Fit training, Dry Needling, Spinal mobilization, Moist heat, Taping, Traction, Manual therapy, and Re-evaluation  PLAN FOR NEXT SESSION: practice floor transfers per pt request, continue manual and TrDN, review and progress HEP for LE strengthening and balance.    Marry Guan, PT 11/26/2022, 5:47 PM

## 2022-11-28 ENCOUNTER — Ambulatory Visit: Payer: Medicare Other | Admitting: Physical Therapy

## 2022-11-29 ENCOUNTER — Encounter: Payer: Self-pay | Admitting: Physical Therapy

## 2022-11-29 ENCOUNTER — Ambulatory Visit: Payer: Medicare Other | Attending: Orthopaedic Surgery | Admitting: Physical Therapy

## 2022-11-29 DIAGNOSIS — M25552 Pain in left hip: Secondary | ICD-10-CM | POA: Insufficient documentation

## 2022-11-29 DIAGNOSIS — R2681 Unsteadiness on feet: Secondary | ICD-10-CM | POA: Diagnosis not present

## 2022-11-29 DIAGNOSIS — M6281 Muscle weakness (generalized): Secondary | ICD-10-CM | POA: Insufficient documentation

## 2022-11-29 DIAGNOSIS — M25551 Pain in right hip: Secondary | ICD-10-CM | POA: Insufficient documentation

## 2022-11-29 NOTE — Therapy (Signed)
OUTPATIENT PHYSICAL THERAPY TREATMENT   Patient Name: [name] MRN: [id number] DOB:06-28-1955, 67 y.o., female Today's Date: 11/29/2022  END OF SESSION:  PT End of Session - 11/29/22 1402     Visit Number 9   for ITB POC   Date for PT Re-Evaluation 01/16/23    Authorization Type [employer] Medicare    PT Start Time 1402    PT Stop Time 1446    PT Time Calculation (min) 44 min    Activity Tolerance Patient tolerated treatment well    Behavior During Therapy WFL for tasks assessed/performed              Past Medical History:  Diagnosis Date   Anemia    Angio-edema    Anxiety    Arthritis    Asthma    Atrial fibrillation (HCC)    Atrial tachycardia (HCC)    Autonomic dysfunction    Complication of anesthesia    pt states wakes up with "shakes"   CVA (cerebral infarction)    2012 with dizziness and vision change felt embolic from atrial tachy   Disorder of bone and cartilage, unspecified    Diverticulosis    DM (diabetes mellitus) (HCC)    DVT (deep venous thrombosis) (HCC)    Eczema    Family history of anesthesia complication    PONV and " shaking "   Fatty liver    GERD (gastroesophageal reflux disease)    History of hiatal hernia    History of radiation therapy 10/24/2012   brain   HLD (hyperlipidemia)    HTN (hypertension)    Hypoglycemia, unspecified    Liver metastasis    Lung metastasis    Metastasis to brain (HCC)    Osteopenia    Other nonspecific abnormal serum enzyme levels    Other specified congenital anomalies of nervous system    Pacemaker    autonomic dysfunction   Pancreatitis    from therapy   Peripheral vascular disease (HCC)    POTS (postural orthostatic tachycardia syndrome)    Raynaud's disease    due to chemo   Skin cancer    Hx: of lung lesion   Past Surgical History:  Procedure Laterality Date   ABLATION SAPHENOUS VEIN W/ RFA     2002 x3   CARPAL TUNNEL RELEASE Left 2000   CHOLECYSTECTOMY N/A 03/28/2018   Procedure:  LAPAROSCOPIC CHOLECYSTECTOMY WITH INTRAOPERATIVE CHOLANGIOGRAM;  Surgeon: Luretha Murphy, MD;  Location: [facility] OR;  Service: General;  Laterality: N/A;   CRANIOTOMY N/A 09/30/2012   suboccipital craniectomy   CRANIOTOMY Left 02/09/2013   Procedure: LEFT PARIETAL CRANIOTOMY with stealth;  Surgeon: Barnett Abu, MD;  Location: [facility];  Service: Neurosurgery;  Laterality: Left;  LEFT Parietal Craniotomy for tumor with stealth   DEEP AXILLARY SENTINEL NODE BIOPSY / EXCISION     due to extensive Melanoma-right arm   fatty tumor removed  2000   from chest   INSERT / REPLACE / REMOVE PACEMAKER  2012   1999, x 3   LAPAROSCOPY  09/06/2011   Procedure: LAPAROSCOPY OPERATIVE;  Surgeon: Tresa Endo A. Ernestina Penna, MD;  Location: [facility];  Service: Gynecology;  Laterality: Left;  with Left Ovarian Cystectomy    MELANOMA EXCISION     with removal of lymph nodes, left shoulder   NOSE SURGERY  2010, 2012   for nose bleeds x 2   SINOSCOPY     TONSILLECTOMY AND ADENOIDECTOMY     TOTAL HIP ARTHROPLASTY Right 05/18/2022  Procedure: RIGHT TOTAL HIP ARTHROPLASTY ANTERIOR APPROACH;  Surgeon: Kathryne Hitch, MD;  Location: [facility];  Service: Orthopedics;  Laterality: Right;   Patient Active Problem List   Diagnosis Date Noted   Status post total replacement of right hip 05/18/2022   Avascular necrosis of bone of right hip (HCC) 04/19/2022   Moderate persistent asthma 12/08/2018   Perennial allergic rhinitis with predominantly nonallergic component 12/08/2018   Allergic conjunctivitis 12/08/2018   History of epistaxis 12/08/2018   Atopic dermatitis 12/08/2018   Recurrent urticaria 12/08/2018   Skeeter syndrome 12/08/2018   Skin lesion of chest wall 07/22/2018   S/P laparoscopic cholecystectomy 03/28/2018   Dehydration 05/18/2016   Type 2 diabetes mellitus without complication, without long-term current use of insulin (HCC) 11/01/2015   History of pacemaker    POTS (postural orthostatic tachycardia  syndrome)    Iron deficiency anemia 11/11/2014   Chronic colitis 08/28/2013   Malignant melanoma, metastatic (HCC) 02/09/2013   Malignant melanoma (HCC) 12/01/2012   Malignant neoplasm metastatic to brain (HCC) 10/16/2012   Hypertension 09/30/2012   Anxiety state 09/30/2012   Hypoxemia 09/30/2012   Disorder of brain 09/08/2012   Dysautonomia (HCC) 02/25/2011   Anticoagulant long-term use 01/19/2011   Neuritis 01/19/2011   Skin change 01/19/2011   Cerebral infarction (HCC)    Palpitation 06/28/2010   Pacemaker-Biotronik 06/28/2010   Dizziness and giddiness 04/27/2010   DIVERTICULOSIS, COLON, HX OF 01/19/2009   CAROTID SINUS SYNDROME 11/17/2008   OTHER MALAISE AND FATIGUE 11/17/2008   HYPERTENSION, BENIGN 11/02/2008   ATRIAL TACHYCARDIA 09/16/2008   SYNCOPE, HX OF 08/09/2008   INJURY UNSPEC NERVE SHOULDER GIRDLE&UPPER LIMB 07/12/2008   PERSONAL HISTORY OF MALIGNANT MELANOMA OF SKIN 07/12/2008   LUQ PAIN 12/08/2007   NECK PAIN 09/26/2007   NUMBNESS, ARM 09/26/2007   CPK, ABNORMAL 09/26/2007   ARM PAIN, LEFT 06/25/2007   SWELLING OF LIMB 06/25/2007   OSTEOPENIA 06/25/2007    PCP: Madelin Headings, MD   REFERRING PROVIDER: Kathryne Hitch, MD  REFERRING DIAG: [phone] (ICD-10-CM) - It band syndrome, left  THERAPY DIAG:  Pain in left hip  Muscle weakness (generalized)  Unsteadiness on feet  Rationale for Evaluation and Treatment: Rehabilitation  ONSET DATE:  2-3 months (prior to IE)  NEXT MD VISIT: 02/13/23   SUBJECTIVE:   SUBJECTIVE STATEMENT: Kaliann reports she was sore in lower abdomen and anterior hip after working on the exercises to help with getting up from the floor.  PERTINENT HISTORY:  anxiety, asthma, DM-II, HTN, CVA, DVT, POTS, pacemaker, malignant melanoma with brain, liver and lung metastasis s/p radiation and L parietal craniotomy, osteopenia, diverticulitis, GERD, and Raynaud's disease. R THA 05/19/22 PAIN:  Are you having pain? Yes: NPRS  scale: 4/10 Pain location: R anterior thigh Pain description: sore Aggravating factors: walking Relieving factors: rest  PRECAUTIONS: ICD/Pacemaker and Other: POTS, advised to stay out of heat, has LE lymphedema  RED FLAGS: None   WEIGHT BEARING RESTRICTIONS: No  FALLS:  Has patient fallen in last 6 months? No  LIVING ENVIRONMENT: Lives with: lives with their spouse Lives in: House/apartment Stairs: Yes: Internal: 13 steps; on right going up, on left going up, and can reach both Has following equipment at home: Single point cane, Walker - 2 wheeled, and shower chair  OCCUPATION: medically disabled due to cancer.   PLOF: Independent walking 2-3 miles, 3-4x/week  PATIENT GOALS: be able to get up from floor, work on balance, decrease L hip pain.     OBJECTIVE:  DIAGNOSTIC FINDINGS: 10/08/2022 XR Bilateral Hips An AP pelvis and lateral both hips shows a well-seated right total hip  arthroplasty.  There is no evidence of AVN with the left hip.   PATIENT SURVEYS:  LEFS 46/80  COGNITION: Overall cognitive status: Within functional limits for tasks assessed     SENSATION: Neuropathy bil feet  EDEMA:  History bil LE edema- being treated at lymphedema clinic  MUSCLE LENGTH: Hamstrings: moderate tightness bilaterally  POSTURE:  weight shift left and leg length discrepancy with R LE longer than L  PALPATION: Tenderness over L glutes, L piriforomis, L greater trochanter, along L Itband.   LOWER EXTREMITY ROM:  Active ROM Right eval Left eval  Hip flexion 140 140  Hip extension    Hip internal rotation 25 25  Hip external rotation 60 60   (Blank rows = not tested)  LOWER EXTREMITY MMT:  MMT Right eval Left eval  Hip flexion 5 5  Hip extension 5 4  Hip abduction 5  5   Hip adduction 5 (seated) 5 (seated)  Hip internal rotation    Hip external rotation    Knee flexion 5 5  Knee extension 5 5  Ankle dorsiflexion 5 5  Ankle plantarflexion 5 5   (Blank  rows = not tested)  LOWER EXTREMITY SPECIAL TESTS:  Hip special tests: Ober's test: positive for pain  FUNCTIONAL TESTS:  5 times sit to stand: 14.5 sec  DGI - 24/24  GAIT: Distance walked: 50 Assistive device utilized: None Level of assistance: Complete Independence Comments: visually slow gait speed.    TODAY'S TREATMENT:                                                                                                                              DATE:   11/29/22 THERAPEUTIC EXERCISE: to improve flexibility, strength and mobility.  Demonstration, verbal and tactile cues throughout for technique. Bike L2 x 6 min Mod thomas R quad/hip flexor stretch with strap 3 x 30"  Prone R quad/hip flexor stretch with roll under thigh and strap assist 3 x 30"   MANUAL THERAPY: To promote normalized muscle tension, improved flexibility, and reduced pain. Skilled palpation and monitoring of soft tissue during DN Trigger Point Dry-Needling  Treatment instructions: Expect mild to moderate muscle soreness. Patient verbalized understanding of these instructions and education. Patient Consent Given: Yes Education handout provided: Previously provided Muscles treated: R quads - VL multiple locations Electrical stimulation performed: No Parameters: N/A Treatment response/outcome: Twitch Response Elicited and Palpable Increase in Muscle Length STM/DTM, manual TPR and pin & stretch to muscles addressed with DN   11/26/22 THERAPEUTIC EXERCISE: to improve flexibility, strength and mobility.  Demonstration, verbal and tactile cues throughout for technique. Bike L2 x 6 min Mod thomas R quad/hip flexor stretch with strap 3 x 30"  Prone R quad/hip flexor stretch with roll under thigh and strap assist 3 x 30" - pt preferring over mod thomas  Standing hip hinge with pole along spine 2 x 5  MANUAL THERAPY: To promote normalized muscle tension, improved flexibility, increased ROM, and reduced pain.  STM and  FR to R lateral quads and ITB Manual R ITB stretch in L S/L with R knee straight and slightly bent x 30" - better stretch noted with knee flexed   THERAPEUTIC ACTIVITIES: Sit to stand - working on building strength and balance: 1/2 kneel with knee on yoga block to knees straight with hands on mat table x 5 with each leg fwd 1/2 kneel with knee on Airex pad to knees straight with hands on mat table x 5 with each leg fwd 1/2 kneel with knee on Airex block to knees straight with ipsilateral hand on elevated knee and opp hand on yoga block on floor x 5 with each leg fwd    11/22/22 Therapeutic Exercise: to improve strength and mobility.  Demo, verbal and tactile cues throughout for technique. Nustep L5 x 6 min  Clock exercise - forward, side and cross 3 x 5 each leg - reporting fatigue and R hip pain afterwards.   Manual Therapy: to decrease muscle spasm and pain and improve mobility STM/TPR and IASTM with s/s tool to R hip - focusing on R hip flexor and ITband   PATIENT EDUCATION:  Education details: HEP update Person educated: Patient Education method: Explanation, Demonstration, Verbal cues, and Handouts Education comprehension: verbalized understanding and returned demonstration  HOME EXERCISE PROGRAM: Access Code: [email or web address] URL: [email or web address] Date: 11/22/2022 Prepared by: Harrie Foreman  Exercises - Supine ITB Stretch  - 1 x daily - 7 x weekly - 1 sets - 3 reps - 30 sec  hold - Supine Hip Internal and External Rotation  - 1 x daily - 7 x weekly - 2 sets - 10 reps - Supine Bridge with Mini Swiss Ball Between Knees  - 1 x daily - 7 x weekly - 2 sets - 10 reps - Prone Hip Extension with Bent Knee  - 1 x daily - 7 x weekly - 2 sets - 10 reps - ITB Stretch at Wall  - 1 x daily - 7 x weekly - 1 sets - 3 reps - 30 sec hold - Standing Hip Flexor Stretch  - 1 x daily - 7 x weekly - 1 sets - 3 reps - 30 sec hold - Squat with Chair Touch  - 3 x daily - 7 x weekly - 2  sets - 5 reps - Lunge Matrix on Slider  - 1 x daily - 7 x weekly - 2-3 sets - 10 reps - Single Leg Balance with Clock Reach  - 1 x daily - 7 x weekly - 3 sets - 5 reps   ASSESSMENT:  CLINICAL IMPRESSION: Quanda reports some increased muscle soreness after her last PT session but states this is gradually subsiding. She still notes some pain in her R lateral quads with very tender TPs identified in VL which appeared amenable to DN. MT performed incorporating DN with good twitch responses elicited resulting in palpable reduction in muscle tension and TTP. Reviewed quad and hip flexor stretches to further encourage muscle relaxation. Reminded pt of recommended post-DN hydration, activity levels and pain management as needed. Taeja will continue to benefit from skilled PT to address above deficits to improve mobility and activity tolerance with decreased pain interference.    OBJECTIVE IMPAIRMENTS: Abnormal gait, decreased activity tolerance, decreased balance, decreased endurance, decreased mobility, difficulty walking, decreased strength, impaired flexibility, and  pain.   ACTIVITY LIMITATIONS: carrying, lifting, bending, standing, squatting, sleeping, and locomotion level  PARTICIPATION LIMITATIONS: meal prep, cleaning, laundry, and shopping  PERSONAL FACTORS: 3+ comorbidities: anxiety, asthma, DM-II, HTN, CVA, DVT, POTS, pacemaker, malignant melanoma with brain, liver and lung metastasis s/p radiation and L parietal craniotomy, osteopenia, diverticulitis, GERD, and Raynaud's disease. R THA 05/19/22  are also affecting patient's functional outcome.   REHAB POTENTIAL: Good  CLINICAL DECISION MAKING: Evolving/moderate complexity  EVALUATION COMPLEXITY: Moderate   GOALS: Goals reviewed with patient? Yes  SHORT TERM GOALS: Target date: 11/07/2022   Patient will be independent with initial HEP. Baseline: given 11/01/22 Goal status: MET  11/15/22  2.  Patient will undergo further balance  testing Baseline:  Goal status: MET  11/01/22 - competed DGI  LONG TERM GOALS: Target date: 12/19/2022   Patient will be independent with advanced/ongoing HEP to improve outcomes and carryover.  Baseline:  Goal status: IN PROGRESS  2.  Patient will report at least 75% improvement in Left hip pain to improve QOL. Baseline:  Goal status: IN PROGRESS  3.  Patient will demonstrate improved functional LE strength as demonstrated by 5x STS <14 seconds to decrease fall risk. Baseline: NT Goal status: IN PROGRESS  4.  Patient will report at least 9 points improvement on LEFS to demonstrate improved functional ability. Baseline: 46/80 Goal status: IN PROGRESS  5.  Patient will demonstrate at least 19/24 on DGI to decrease risk of falls. Baseline: NT Goal status: MET  11/01/22 - 24/24  6.  Patient will be able to perform floor to stand transfer without use of furniture to assist safely.  Baseline: gets "stuck" when squatting down to pick up item, has to crawl to get back up.  Goal status: IN PROGRESS  11/19/22- able to perform with supervision today.    PLAN:  PT FREQUENCY: 1-2x/week  PT DURATION: 8 weeks  PLANNED INTERVENTIONS: Therapeutic exercises, Therapeutic activity, Neuromuscular re-education, Balance training, Gait training, Patient/Family education, Self Care, Joint mobilization, Stair training, Orthotic/Fit training, Dry Needling, Spinal mobilization, Moist heat, Taping, Traction, Manual therapy, and Re-evaluation  PLAN FOR NEXT SESSION: 10th visit PN; practice floor transfers per pt request, continue manual and TrDN, review and progress HEP for LE strengthening and balance.    Marry Guan, PT 11/29/2022, 3:09 PM

## 2022-12-06 ENCOUNTER — Ambulatory Visit: Payer: Medicare Other | Admitting: Physical Therapy

## 2022-12-10 ENCOUNTER — Encounter: Payer: Self-pay | Admitting: Physical Therapy

## 2022-12-10 ENCOUNTER — Ambulatory Visit: Payer: Medicare Other | Admitting: Physical Therapy

## 2022-12-10 DIAGNOSIS — R2681 Unsteadiness on feet: Secondary | ICD-10-CM

## 2022-12-10 DIAGNOSIS — M6281 Muscle weakness (generalized): Secondary | ICD-10-CM | POA: Diagnosis not present

## 2022-12-10 DIAGNOSIS — M25551 Pain in right hip: Secondary | ICD-10-CM | POA: Diagnosis not present

## 2022-12-10 DIAGNOSIS — M25552 Pain in left hip: Secondary | ICD-10-CM

## 2022-12-10 NOTE — Therapy (Signed)
OUTPATIENT PHYSICAL THERAPY TREATMENT Progress Note/Recert Reporting Period 10/24/22 to 12/10/22  See note below for Objective Data and Assessment of Progress/Goals.      Patient Name: [name] MRN: [id number] DOB:[birth year], 67 y.o., female Today's Date: 12/10/2022  END OF SESSION:  PT End of Session - 12/10/22 1405     Visit Number 10    Date for PT Re-Evaluation 01/07/23    Authorization Type [employer] Medicare    PT Start Time 1403    PT Stop Time 1445    PT Time Calculation (min) 42 min    Activity Tolerance Patient tolerated treatment well    Behavior During Therapy WFL for tasks assessed/performed              Past Medical History:  Diagnosis Date   Anemia    Angio-edema    Anxiety    Arthritis    Asthma    Atrial fibrillation (HCC)    Atrial tachycardia (HCC)    Autonomic dysfunction    Complication of anesthesia    pt states wakes up with "shakes"   CVA (cerebral infarction)    2012 with dizziness and vision change felt embolic from atrial tachy   Disorder of bone and cartilage, unspecified    Diverticulosis    DM (diabetes mellitus) (HCC)    DVT (deep venous thrombosis) (HCC)    Eczema    Family history of anesthesia complication    PONV and " shaking "   Fatty liver    GERD (gastroesophageal reflux disease)    History of hiatal hernia    History of radiation therapy 10/24/2012   brain   HLD (hyperlipidemia)    HTN (hypertension)    Hypoglycemia, unspecified    Liver metastasis    Lung metastasis    Metastasis to brain (HCC)    Osteopenia    Other nonspecific abnormal serum enzyme levels    Other specified congenital anomalies of nervous system    Pacemaker    autonomic dysfunction   Pancreatitis    from therapy   Peripheral vascular disease (HCC)    POTS (postural orthostatic tachycardia syndrome)    Raynaud's disease    due to chemo   Skin cancer    Hx: of lung lesion   Past Surgical History:  Procedure Laterality Date    ABLATION SAPHENOUS VEIN W/ RFA     2002 x3   CARPAL TUNNEL RELEASE Left 2000   CHOLECYSTECTOMY N/A 03/28/2018   Procedure: LAPAROSCOPIC CHOLECYSTECTOMY WITH INTRAOPERATIVE CHOLANGIOGRAM;  Surgeon: Amanda Murphy, MD;  Location: [facility] OR;  Service: General;  Laterality: N/A;   CRANIOTOMY N/A 09/30/2012   suboccipital craniectomy   CRANIOTOMY Left 02/09/2013   Procedure: LEFT PARIETAL CRANIOTOMY with stealth;  Surgeon: Amanda Abu, MD;  Location: [facility];  Service: Neurosurgery;  Laterality: Left;  LEFT Parietal Craniotomy for tumor with stealth   DEEP AXILLARY SENTINEL NODE BIOPSY / EXCISION     due to extensive Melanoma-right arm   fatty tumor removed  2000   from chest   INSERT / REPLACE / REMOVE PACEMAKER  2012   1999, x 3   LAPAROSCOPY  09/06/2011   Procedure: LAPAROSCOPY OPERATIVE;  Surgeon: Amanda Endo A. Ernestina Penna, MD;  Location: [facility] ORS;  Service: Gynecology;  Laterality: Left;  with Left Ovarian Cystectomy    MELANOMA EXCISION     with removal of lymph nodes, left shoulder   NOSE SURGERY  2010, 2012   for nose bleeds x 2  SINOSCOPY     TONSILLECTOMY AND ADENOIDECTOMY     TOTAL HIP ARTHROPLASTY Right 05/18/2022   Procedure: RIGHT TOTAL HIP ARTHROPLASTY ANTERIOR APPROACH;  Surgeon: Amanda Hitch, MD;  Location: [facility] ORS;  Service: Orthopedics;  Laterality: Right;   Patient Active Problem List   Diagnosis Date Noted   Status post total replacement of right hip 05/18/2022   Avascular necrosis of bone of right hip (HCC) 04/19/2022   Moderate persistent asthma 12/08/2018   Perennial allergic rhinitis with predominantly nonallergic component 12/08/2018   Allergic conjunctivitis 12/08/2018   History of epistaxis 12/08/2018   Atopic dermatitis 12/08/2018   Recurrent urticaria 12/08/2018   Skeeter syndrome 12/08/2018   Skin lesion of chest wall 07/22/2018   S/P laparoscopic cholecystectomy 03/28/2018   Dehydration 05/18/2016   Type 2 diabetes mellitus without complication,  without long-term current use of insulin (HCC) 11/01/2015   History of pacemaker    POTS (postural orthostatic tachycardia syndrome)    Iron deficiency anemia 11/11/2014   Chronic colitis 08/28/2013   Malignant melanoma, metastatic (HCC) 02/09/2013   Malignant melanoma (HCC) 12/01/2012   Malignant neoplasm metastatic to brain (HCC) 10/16/2012   Hypertension 09/30/2012   Anxiety state 09/30/2012   Hypoxemia 09/30/2012   Disorder of brain 09/08/2012   Dysautonomia (HCC) 02/25/2011   Anticoagulant long-term use 01/19/2011   Neuritis 01/19/2011   Skin change 01/19/2011   Cerebral infarction (HCC)    Palpitation 06/28/2010   Pacemaker-Biotronik 06/28/2010   Dizziness and giddiness 04/27/2010   DIVERTICULOSIS, COLON, HX OF 01/19/2009   CAROTID SINUS SYNDROME 11/17/2008   OTHER MALAISE AND FATIGUE 11/17/2008   HYPERTENSION, BENIGN 11/02/2008   ATRIAL TACHYCARDIA 09/16/2008   SYNCOPE, HX OF 08/09/2008   INJURY UNSPEC NERVE SHOULDER GIRDLE&UPPER LIMB 07/12/2008   PERSONAL HISTORY OF MALIGNANT MELANOMA OF SKIN 07/12/2008   LUQ PAIN 12/08/2007   NECK PAIN 09/26/2007   NUMBNESS, ARM 09/26/2007   CPK, ABNORMAL 09/26/2007   ARM PAIN, LEFT 06/25/2007   SWELLING OF LIMB 06/25/2007   OSTEOPENIA 06/25/2007    PCP: Amanda Headings, MD   REFERRING PROVIDER: Kathryne Hitch, MD  REFERRING DIAG: [phone] (ICD-10-CM) - It band syndrome, left  THERAPY DIAG:  Pain in left hip  Muscle weakness (generalized)  Unsteadiness on feet  Pain in right hip  Rationale for Evaluation and Treatment: Rehabilitation  ONSET DATE:  2-3 months (prior to IE)  NEXT MD VISIT: 02/13/23   SUBJECTIVE:   SUBJECTIVE STATEMENT: [name] reports L hip has been doing well since she got the shot, having more pain in R side.   PERTINENT HISTORY:  anxiety, asthma, DM-II, HTN, CVA, DVT, POTS, pacemaker, malignant melanoma with brain, liver and lung metastasis s/p radiation and L parietal craniotomy,  osteopenia, diverticulitis, GERD, and Raynaud's disease. R THA 05/19/22 PAIN:  Are you having pain? Yes: NPRS scale: 8/10 Pain location: R anterior thigh Pain description: sore Aggravating factors: walking Relieving factors: rest  PRECAUTIONS: ICD/Pacemaker and Other: POTS, advised to stay out of heat, has LE lymphedema  RED FLAGS: None   WEIGHT BEARING RESTRICTIONS: No  FALLS:  Has patient fallen in last 6 months? No  LIVING ENVIRONMENT: Lives with: lives with their spouse Lives in: House/apartment Stairs: Yes: Internal: 13 steps; on right going up, on left going up, and can reach both Has following equipment at home: Single point cane, Walker - 2 wheeled, and shower chair  OCCUPATION: medically disabled due to cancer.   PLOF: Independent walking 2-3 miles, 3-4x/week  PATIENT  GOALS: be able to get up from floor, work on balance, decrease L hip pain.     OBJECTIVE:   DIAGNOSTIC FINDINGS: 10/08/2022 XR Bilateral Hips An AP pelvis and lateral both hips shows a well-seated right total hip  arthroplasty.  There is no evidence of AVN with the left hip.   PATIENT SURVEYS:  LEFS 46/80  COGNITION: Overall cognitive status: Within functional limits for tasks assessed     SENSATION: Neuropathy bil feet  EDEMA:  History bil LE edema- being treated at lymphedema clinic  MUSCLE LENGTH: Hamstrings: moderate tightness bilaterally  POSTURE:  weight shift left and leg length discrepancy with R LE longer than L  PALPATION: Tenderness over L glutes, L piriforomis, L greater trochanter, along L Itband.   LOWER EXTREMITY ROM:  Active ROM Right eval Left eval  Hip flexion 140 140  Hip extension    Hip internal rotation 25 25  Hip external rotation 60 60   (Blank rows = not tested)  LOWER EXTREMITY MMT:  MMT Right eval Left eval  Hip flexion 5 5  Hip extension 5 4  Hip abduction 5  5   Hip adduction 5 (seated) 5 (seated)  Hip internal rotation    Hip external  rotation    Knee flexion 5 5  Knee extension 5 5  Ankle dorsiflexion 5 5  Ankle plantarflexion 5 5   (Blank rows = not tested)  LOWER EXTREMITY SPECIAL TESTS:  Hip special tests: Ober's test: positive for pain  FUNCTIONAL TESTS:  5 times sit to stand: 14.5 sec  DGI - 24/24  GAIT: Distance walked: 50 Assistive device utilized: None Level of assistance: Complete Independence Comments: visually slow gait speed.    TODAY'S TREATMENT:                                                                                                                              DATE:   12/10/22 Therapeutic Activity:  assessing progress towards goals and concerns Nustep L5 x 5 min for warm-up and subjective 5xSTS Concerns with R and L hip and activity modification Manual Therapy: to decrease muscle spasm and pain and improve mobility Manual edema resorption to R hip around TFL, scar tissue massage, STM/TPR to R hamstrings and quads around ITband   11/29/22 THERAPEUTIC EXERCISE: to improve flexibility, strength and mobility.  Demonstration, verbal and tactile cues throughout for technique. Bike L2 x 6 min Mod thomas R quad/hip flexor stretch with strap 3 x 30"  Prone R quad/hip flexor stretch with roll under thigh and strap assist 3 x 30"   MANUAL THERAPY: To promote normalized muscle tension, improved flexibility, and reduced pain. Skilled palpation and monitoring of soft tissue during DN Trigger Point Dry-Needling  Treatment instructions: Expect mild to moderate muscle soreness. Patient verbalized understanding of these instructions and education. Patient Consent Given: Yes Education handout provided: Previously provided Muscles treated: R quads - VL multiple locations Electrical stimulation performed: No  Parameters: N/A Treatment response/outcome: Twitch Response Elicited and Palpable Increase in Muscle Length STM/DTM, manual TPR and pin & stretch to muscles addressed with  DN   11/26/22 THERAPEUTIC EXERCISE: to improve flexibility, strength and mobility.  Demonstration, verbal and tactile cues throughout for technique. Bike L2 x 6 min Mod thomas R quad/hip flexor stretch with strap 3 x 30"  Prone R quad/hip flexor stretch with roll under thigh and strap assist 3 x 30" - pt preferring over mod thomas  Standing hip hinge with pole along spine 2 x 5  MANUAL THERAPY: To promote normalized muscle tension, improved flexibility, increased ROM, and reduced pain.  STM and FR to R lateral quads and ITB Manual R ITB stretch in L S/L with R knee straight and slightly bent x 30" - better stretch noted with knee flexed   THERAPEUTIC ACTIVITIES: Sit to stand - working on International aid/development worker and balance: 1/2 kneel with knee on yoga block to knees straight with hands on mat table x 5 with each leg fwd 1/2 kneel with knee on Airex pad to knees straight with hands on mat table x 5 with each leg fwd 1/2 kneel with knee on Airex block to knees straight with ipsilateral hand on elevated knee and opp hand on yoga block on floor x 5 with each leg fwd     PATIENT EDUCATION:  Education details: HEP update Person educated: Patient Education method: Explanation, Demonstration, Verbal cues, and Handouts Education comprehension: verbalized understanding and returned demonstration  HOME EXERCISE PROGRAM: Access Code: [email or web address] URL: [email or web address] Date: 11/22/2022 Prepared by: Harrie Foreman  Exercises - Supine ITB Stretch  - 1 x daily - 7 x weekly - 1 sets - 3 reps - 30 sec  hold - Supine Hip Internal and External Rotation  - 1 x daily - 7 x weekly - 2 sets - 10 reps - Supine Bridge with Mini Swiss Ball Between Knees  - 1 x daily - 7 x weekly - 2 sets - 10 reps - Prone Hip Extension with Bent Knee  - 1 x daily - 7 x weekly - 2 sets - 10 reps - ITB Stretch at Wall  - 1 x daily - 7 x weekly - 1 sets - 3 reps - 30 sec hold - Standing Hip Flexor Stretch  - 1 x  daily - 7 x weekly - 1 sets - 3 reps - 30 sec hold - Squat with Chair Touch  - 3 x daily - 7 x weekly - 2 sets - 5 reps - Lunge Matrix on Slider  - 1 x daily - 7 x weekly - 2-3 sets - 10 reps - Single Leg Balance with Clock Reach  - 1 x daily - 7 x weekly - 3 sets - 5 reps   ASSESSMENT:  CLINICAL IMPRESSION: Luevenia reports 100% improvement in L hip pain, however she has been getting more R hip pain and swelling now, especially after walking.  Today noted that she had swelling over R TFL, but none over her groin, stopped by scar from R hip replacement.  She demonstrated good functional LE strength with 5x STS of 13.5 sec meeting LTG #3.  Noted decreased swelling and tenderness in R hip following manual therapy.  Due to new onset of R hip pain limiting activity tolerance, extending current POC for additional 4 weeks.     OBJECTIVE IMPAIRMENTS: Abnormal gait, decreased activity tolerance, decreased balance, decreased endurance, decreased mobility, difficulty walking, decreased strength, impaired flexibility, and  pain.   ACTIVITY LIMITATIONS: carrying, lifting, bending, standing, squatting, sleeping, and locomotion level  PARTICIPATION LIMITATIONS: meal prep, cleaning, laundry, and shopping  PERSONAL FACTORS: 3+ comorbidities: anxiety, asthma, DM-II, HTN, CVA, DVT, POTS, pacemaker, malignant melanoma with brain, liver and lung metastasis s/p radiation and L parietal craniotomy, osteopenia, diverticulitis, GERD, and Raynaud's disease. R THA 05/19/22  are also affecting patient's functional outcome.   REHAB POTENTIAL: Good  CLINICAL DECISION MAKING: Evolving/moderate complexity  EVALUATION COMPLEXITY: Moderate   GOALS: Goals reviewed with patient? Yes  SHORT TERM GOALS: Target date: 11/07/2022   Patient will be independent with initial HEP. Baseline: given 11/01/22 Goal status: MET  11/15/22  2.  Patient will undergo further balance testing Baseline:  Goal status: MET  11/01/22 - competed  DGI  LONG TERM GOALS: Target date: 12/19/2022 extended to 01/07/2023  Patient will be independent with advanced/ongoing HEP to improve outcomes and carryover.  Baseline:  Goal status: IN PROGRESS 12/10/22- met for current  2.  Patient will report at least 75% improvement in Left hip pain to improve QOL. Baseline:  Goal status: MET 12/10/22- 100% improvement  3.  Patient will demonstrate improved functional LE strength as demonstrated by 5x STS <14 seconds to decrease fall risk. Baseline: NT Goal status: MET 12/10/22 13.5 seconds  4.  Patient will report at least 9 points improvement on LEFS to demonstrate improved functional ability. Baseline: 46/80 Goal status: IN PROGRESS  5.  Patient will demonstrate at least 19/24 on DGI to decrease risk of falls. Baseline: NT Goal status: MET  11/01/22 - 24/24  6.  Patient will be able to perform floor to stand transfer without use of furniture to assist safely.  Baseline: gets "stuck" when squatting down to pick up item, has to crawl to get back up.  Goal status: IN PROGRESS  11/19/22- able to perform with supervision today.    PLAN:  PT FREQUENCY: 1-2x/week  PT DURATION: 8 weeks  PLANNED INTERVENTIONS: Therapeutic exercises, Therapeutic activity, Neuromuscular re-education, Balance training, Gait training, Patient/Family education, Self Care, Joint mobilization, Stair training, Orthotic/Fit training, Dry Needling, Spinal mobilization, Moist heat, Taping, Traction, Manual therapy, and Re-evaluation  PLAN FOR NEXT SESSION: practice floor transfers per pt request, continue manual and TrDN, review and progress HEP for LE strengthening and balance.    Jena Gauss, PT 12/10/2022, 5:32 PM

## 2022-12-13 ENCOUNTER — Encounter: Payer: Self-pay | Admitting: Physical Therapy

## 2022-12-13 ENCOUNTER — Ambulatory Visit: Payer: Medicare Other | Admitting: Physical Therapy

## 2022-12-13 DIAGNOSIS — M25552 Pain in left hip: Secondary | ICD-10-CM | POA: Diagnosis not present

## 2022-12-13 DIAGNOSIS — R2681 Unsteadiness on feet: Secondary | ICD-10-CM

## 2022-12-13 DIAGNOSIS — M25551 Pain in right hip: Secondary | ICD-10-CM | POA: Diagnosis not present

## 2022-12-13 DIAGNOSIS — M6281 Muscle weakness (generalized): Secondary | ICD-10-CM

## 2022-12-13 NOTE — Therapy (Signed)
OUTPATIENT PHYSICAL THERAPY TREATMENT    Patient Name: [name] MRN: [id number] DOB:06-Oct-1955, 67 y.o., female Today's Date: 12/13/2022  END OF SESSION:  PT End of Session - 12/13/22 1404     Visit Number 11    Date for PT Re-Evaluation 01/07/23    Authorization Type [employer] Medicare    PT Start Time 1402    PT Stop Time 1442    PT Time Calculation (min) 40 min    Activity Tolerance Patient tolerated treatment well    Behavior During Therapy WFL for tasks assessed/performed              Past Medical History:  Diagnosis Date   Anemia    Angio-edema    Anxiety    Arthritis    Asthma    Atrial fibrillation (HCC)    Atrial tachycardia (HCC)    Autonomic dysfunction    Complication of anesthesia    pt states wakes up with "shakes"   CVA (cerebral infarction)    2012 with dizziness and vision change felt embolic from atrial tachy   Disorder of bone and cartilage, unspecified    Diverticulosis    DM (diabetes mellitus) (HCC)    DVT (deep venous thrombosis) (HCC)    Eczema    Family history of anesthesia complication    PONV and " shaking "   Fatty liver    GERD (gastroesophageal reflux disease)    History of hiatal hernia    History of radiation therapy 10/24/2012   brain   HLD (hyperlipidemia)    HTN (hypertension)    Hypoglycemia, unspecified    Liver metastasis    Lung metastasis    Metastasis to brain (HCC)    Osteopenia    Other nonspecific abnormal serum enzyme levels    Other specified congenital anomalies of nervous system    Pacemaker    autonomic dysfunction   Pancreatitis    from therapy   Peripheral vascular disease (HCC)    POTS (postural orthostatic tachycardia syndrome)    Raynaud's disease    due to chemo   Skin cancer    Hx: of lung lesion   Past Surgical History:  Procedure Laterality Date   ABLATION SAPHENOUS VEIN W/ RFA     2002 x3   CARPAL TUNNEL RELEASE Left 2000   CHOLECYSTECTOMY N/A 03/28/2018   Procedure:  LAPAROSCOPIC CHOLECYSTECTOMY WITH INTRAOPERATIVE CHOLANGIOGRAM;  Surgeon: Luretha Murphy, MD;  Location: [facility] OR;  Service: General;  Laterality: N/A;   CRANIOTOMY N/A 09/30/2012   suboccipital craniectomy   CRANIOTOMY Left 02/09/2013   Procedure: LEFT PARIETAL CRANIOTOMY with stealth;  Surgeon: Barnett Abu, MD;  Location: [facility];  Service: Neurosurgery;  Laterality: Left;  LEFT Parietal Craniotomy for tumor with stealth   DEEP AXILLARY SENTINEL NODE BIOPSY / EXCISION     due to extensive Melanoma-right arm   fatty tumor removed  2000   from chest   INSERT / REPLACE / REMOVE PACEMAKER  2012   1999, x 3   LAPAROSCOPY  09/06/2011   Procedure: LAPAROSCOPY OPERATIVE;  Surgeon: Tresa Endo A. Ernestina Penna, MD;  Location: [facility] ORS;  Service: Gynecology;  Laterality: Left;  with Left Ovarian Cystectomy    MELANOMA EXCISION     with removal of lymph nodes, left shoulder   NOSE SURGERY  2010, 2012   for nose bleeds x 2   SINOSCOPY     TONSILLECTOMY AND ADENOIDECTOMY     TOTAL HIP ARTHROPLASTY Right 05/18/2022   Procedure:  RIGHT TOTAL HIP ARTHROPLASTY ANTERIOR APPROACH;  Surgeon: Kathryne Hitch, MD;  Location: [facility] ORS;  Service: Orthopedics;  Laterality: Right;   Patient Active Problem List   Diagnosis Date Noted   Status post total replacement of right hip 05/18/2022   Avascular necrosis of bone of right hip (HCC) 04/19/2022   Moderate persistent asthma 12/08/2018   Perennial allergic rhinitis with predominantly nonallergic component 12/08/2018   Allergic conjunctivitis 12/08/2018   History of epistaxis 12/08/2018   Atopic dermatitis 12/08/2018   Recurrent urticaria 12/08/2018   Skeeter syndrome 12/08/2018   Skin lesion of chest wall 07/22/2018   S/P laparoscopic cholecystectomy 03/28/2018   Dehydration 05/18/2016   Type 2 diabetes mellitus without complication, without long-term current use of insulin (HCC) 11/01/2015   History of pacemaker    POTS (postural orthostatic tachycardia  syndrome)    Iron deficiency anemia 11/11/2014   Chronic colitis 08/28/2013   Malignant melanoma, metastatic (HCC) 02/09/2013   Malignant melanoma (HCC) 12/01/2012   Malignant neoplasm metastatic to brain (HCC) 10/16/2012   Hypertension 09/30/2012   Anxiety state 09/30/2012   Hypoxemia 09/30/2012   Disorder of brain 09/08/2012   Dysautonomia (HCC) 02/25/2011   Anticoagulant long-term use 01/19/2011   Neuritis 01/19/2011   Skin change 01/19/2011   Cerebral infarction (HCC)    Palpitation 06/28/2010   Pacemaker-Biotronik 06/28/2010   Dizziness and giddiness 04/27/2010   DIVERTICULOSIS, COLON, HX OF 01/19/2009   CAROTID SINUS SYNDROME 11/17/2008   OTHER MALAISE AND FATIGUE 11/17/2008   HYPERTENSION, BENIGN 11/02/2008   ATRIAL TACHYCARDIA 09/16/2008   SYNCOPE, HX OF 08/09/2008   INJURY UNSPEC NERVE SHOULDER GIRDLE&UPPER LIMB 07/12/2008   PERSONAL HISTORY OF MALIGNANT MELANOMA OF SKIN 07/12/2008   LUQ PAIN 12/08/2007   NECK PAIN 09/26/2007   NUMBNESS, ARM 09/26/2007   CPK, ABNORMAL 09/26/2007   ARM PAIN, LEFT 06/25/2007   SWELLING OF LIMB 06/25/2007   OSTEOPENIA 06/25/2007    PCP: Madelin Headings, MD   REFERRING PROVIDER: Kathryne Hitch, MD  REFERRING DIAG: [phone] (ICD-10-CM) - It band syndrome, left  THERAPY DIAG:  Pain in left hip  Muscle weakness (generalized)  Unsteadiness on feet  Pain in right hip  Rationale for Evaluation and Treatment: Rehabilitation  ONSET DATE:  2-3 months (prior to IE)  NEXT MD VISIT: 02/13/23   SUBJECTIVE:   SUBJECTIVE STATEMENT: [name] reports she took my advice and didn't walk for 2 days, the hip feels much better.  Swelling is much better too.     PERTINENT HISTORY:  anxiety, asthma, DM-II, HTN, CVA, DVT, POTS, pacemaker, malignant melanoma with brain, liver and lung metastasis s/p radiation and L parietal craniotomy, osteopenia, diverticulitis, GERD, and Raynaud's disease. R THA 05/19/22 PAIN:  Are you having pain?  Yes: NPRS scale: 4/10 Pain location: R anterior thigh Pain description: sore Aggravating factors: walking Relieving factors: rest  PRECAUTIONS: ICD/Pacemaker and Other: POTS, advised to stay out of heat, has LE lymphedema  RED FLAGS: None   WEIGHT BEARING RESTRICTIONS: No  FALLS:  Has patient fallen in last 6 months? No  LIVING ENVIRONMENT: Lives with: lives with their spouse Lives in: House/apartment Stairs: Yes: Internal: 13 steps; on right going up, on left going up, and can reach both Has following equipment at home: Single point cane, Walker - 2 wheeled, and shower chair  OCCUPATION: medically disabled due to cancer.   PLOF: Independent walking 2-3 miles, 3-4x/week  PATIENT GOALS: be able to get up from floor, work on balance, decrease L hip  pain.     OBJECTIVE:   DIAGNOSTIC FINDINGS: 10/08/2022 XR Bilateral Hips An AP pelvis and lateral both hips shows a well-seated right total hip  arthroplasty.  There is no evidence of AVN with the left hip.   PATIENT SURVEYS:  LEFS 46/80  COGNITION: Overall cognitive status: Within functional limits for tasks assessed     SENSATION: Neuropathy bil feet  EDEMA:  History bil LE edema- being treated at lymphedema clinic  MUSCLE LENGTH: Hamstrings: moderate tightness bilaterally  POSTURE:  weight shift left and leg length discrepancy with R LE longer than L  PALPATION: Tenderness over L glutes, L piriforomis, L greater trochanter, along L Itband.   LOWER EXTREMITY ROM:  Active ROM Right eval Left eval  Hip flexion 140 140  Hip extension    Hip internal rotation 25 25  Hip external rotation 60 60   (Blank rows = not tested)  LOWER EXTREMITY MMT:  MMT Right eval Left eval  Hip flexion 5 5  Hip extension 5 4  Hip abduction 5  5   Hip adduction 5 (seated) 5 (seated)  Hip internal rotation    Hip external rotation    Knee flexion 5 5  Knee extension 5 5  Ankle dorsiflexion 5 5  Ankle plantarflexion 5 5    (Blank rows = not tested)  LOWER EXTREMITY SPECIAL TESTS:  Hip special tests: Ober's test: positive for pain  FUNCTIONAL TESTS:  5 times sit to stand: 14.5 sec  DGI - 24/24  GAIT: Distance walked: 50 Assistive device utilized: None Level of assistance: Complete Independence Comments: visually slow gait speed.    TODAY'S TREATMENT:                                                                                                                              DATE:   12/13/22 Therapeutic Exercise: to improve strength and mobility.  Demo, verbal and tactile cues throughout for technique. Nustep L5 x 5 min  Standing hip openers 2 x 8 bil  Standing lunges - foot on towel  x 10 forward, lateral and back, bil  Supine bridge x 10, bridge with feet on bosu 2 x 10  Manual Therapy: to decrease muscle spasm and pain and improve mobility IASTM to R hip flexors, scar tissue mobilization, Itband to decrease pain and spasm.    12/10/22 Therapeutic Activity:  assessing progress towards goals and concerns Nustep L5 x 5 min for warm-up and subjective 5xSTS Concerns with R and L hip and activity modification Manual Therapy: to decrease muscle spasm and pain and improve mobility Manual edema resorption to R hip around TFL, scar tissue massage, STM/TPR to R hamstrings and quads around ITband  11/29/22 THERAPEUTIC EXERCISE: to improve flexibility, strength and mobility.  Demonstration, verbal and tactile cues throughout for technique. Bike L2 x 6 min Mod thomas R quad/hip flexor stretch with strap 3 x 30"  Prone R quad/hip flexor stretch with roll  under thigh and strap assist 3 x 30"   MANUAL THERAPY: To promote normalized muscle tension, improved flexibility, and reduced pain. Skilled palpation and monitoring of soft tissue during DN Trigger Point Dry-Needling  Treatment instructions: Expect mild to moderate muscle soreness. Patient verbalized understanding of these instructions and  education. Patient Consent Given: Yes Education handout provided: Previously provided Muscles treated: R quads - VL multiple locations Electrical stimulation performed: No Parameters: N/A Treatment response/outcome: Twitch Response Elicited and Palpable Increase in Muscle Length STM/DTM, manual TPR and pin & stretch to muscles addressed with DN    PATIENT EDUCATION:  Education details: HEP review Person educated: Patient Education method: Medical illustrator Education comprehension: verbalized understanding and returned demonstration  HOME EXERCISE PROGRAM: Access Code: [email or web address] URL: [email or web address] Date: 11/22/2022 Prepared by: Harrie Foreman  Exercises - Supine ITB Stretch  - 1 x daily - 7 x weekly - 1 sets - 3 reps - 30 sec  hold - Supine Hip Internal and External Rotation  - 1 x daily - 7 x weekly - 2 sets - 10 reps - Supine Bridge with Mini Swiss Ball Between Knees  - 1 x daily - 7 x weekly - 2 sets - 10 reps - Prone Hip Extension with Bent Knee  - 1 x daily - 7 x weekly - 2 sets - 10 reps - ITB Stretch at Wall  - 1 x daily - 7 x weekly - 1 sets - 3 reps - 30 sec hold - Standing Hip Flexor Stretch  - 1 x daily - 7 x weekly - 1 sets - 3 reps - 30 sec hold - Squat with Chair Touch  - 3 x daily - 7 x weekly - 2 sets - 5 reps - Lunge Matrix on Slider  - 1 x daily - 7 x weekly - 2-3 sets - 10 reps - Single Leg Balance with Clock Reach  - 1 x daily - 7 x weekly - 3 sets - 5 reps   ASSESSMENT:  CLINICAL IMPRESSION: [name] reports improved R hip pain and swelling today after last session and rest.  Reported more heaviness then pain with exercises today.  Advised decreasing distance walked to decrease inflammation further before increasing distance again to tolerance.  [name] continues to demonstrate potential for improvement and would benefit from continued skilled therapy to address impairments.   .     OBJECTIVE IMPAIRMENTS: Abnormal  gait, decreased activity tolerance, decreased balance, decreased endurance, decreased mobility, difficulty walking, decreased strength, impaired flexibility, and pain.   ACTIVITY LIMITATIONS: carrying, lifting, bending, standing, squatting, sleeping, and locomotion level  PARTICIPATION LIMITATIONS: meal prep, cleaning, laundry, and shopping  PERSONAL FACTORS: 3+ comorbidities: anxiety, asthma, DM-II, HTN, CVA, DVT, POTS, pacemaker, malignant melanoma with brain, liver and lung metastasis s/p radiation and L parietal craniotomy, osteopenia, diverticulitis, GERD, and Raynaud's disease. R THA 05/19/22  are also affecting patient's functional outcome.   REHAB POTENTIAL: Good  CLINICAL DECISION MAKING: Evolving/moderate complexity  EVALUATION COMPLEXITY: Moderate   GOALS: Goals reviewed with patient? Yes  SHORT TERM GOALS: Target date: 11/07/2022   Patient will be independent with initial HEP. Baseline: given 11/01/22 Goal status: MET  11/15/22  2.  Patient will undergo further balance testing Baseline:  Goal status: MET  11/01/22 - competed DGI  LONG TERM GOALS: Target date: 12/19/2022 extended to 01/07/2023  Patient will be independent with advanced/ongoing HEP to improve outcomes and carryover.  Baseline:  Goal status: IN PROGRESS 12/10/22- met for current  2.  Patient will report at least 75% improvement in Left hip pain to improve QOL. Baseline:  Goal status: MET 12/10/22- 100% improvement  3.  Patient will demonstrate improved functional LE strength as demonstrated by 5x STS <14 seconds to decrease fall risk. Baseline: NT Goal status: MET 12/10/22 13.5 seconds  4.  Patient will report at least 9 points improvement on LEFS to demonstrate improved functional ability. Baseline: 46/80 Goal status: IN PROGRESS  5.  Patient will demonstrate at least 19/24 on DGI to decrease risk of falls. Baseline: NT Goal status: MET  11/01/22 - 24/24  6.  Patient will be able to perform floor to  stand transfer without use of furniture to assist safely.  Baseline: gets "stuck" when squatting down to pick up item, has to crawl to get back up.  Goal status: IN PROGRESS  11/19/22- able to perform with supervision today.    PLAN:  PT FREQUENCY: 1-2x/week  PT DURATION: 8 weeks  PLANNED INTERVENTIONS: Therapeutic exercises, Therapeutic activity, Neuromuscular re-education, Balance training, Gait training, Patient/Family education, Self Care, Joint mobilization, Stair training, Orthotic/Fit training, Dry Needling, Spinal mobilization, Moist heat, Taping, Traction, Manual therapy, and Re-evaluation  PLAN FOR NEXT SESSION: practice floor transfers per pt request, continue manual and TrDN, review and progress HEP for LE strengthening and balance.    Jena Gauss, PT 12/13/2022, 2:48 PM

## 2022-12-14 ENCOUNTER — Other Ambulatory Visit: Payer: Self-pay | Admitting: Internal Medicine

## 2022-12-17 ENCOUNTER — Encounter: Payer: Self-pay | Admitting: Physical Therapy

## 2022-12-17 ENCOUNTER — Ambulatory Visit: Payer: Medicare Other | Admitting: Physical Therapy

## 2022-12-17 DIAGNOSIS — M25551 Pain in right hip: Secondary | ICD-10-CM

## 2022-12-17 DIAGNOSIS — M25552 Pain in left hip: Secondary | ICD-10-CM

## 2022-12-17 DIAGNOSIS — R2681 Unsteadiness on feet: Secondary | ICD-10-CM | POA: Diagnosis not present

## 2022-12-17 DIAGNOSIS — M6281 Muscle weakness (generalized): Secondary | ICD-10-CM | POA: Diagnosis not present

## 2022-12-17 NOTE — Therapy (Signed)
OUTPATIENT PHYSICAL THERAPY TREATMENT    Patient Name: [name] MRN: [id number] DOB:1955-09-27, 67 y.o., female Today's Date: 12/17/2022  END OF SESSION:  PT End of Session - 12/17/22 1408     Visit Number 12    Date for PT Re-Evaluation 01/07/23    Authorization Type [employer] Medicare    PT Start Time 1407    PT Stop Time 1448    PT Time Calculation (min) 41 min    Activity Tolerance Patient tolerated treatment well    Behavior During Therapy WFL for tasks assessed/performed              Past Medical History:  Diagnosis Date   Anemia    Angio-edema    Anxiety    Arthritis    Asthma    Atrial fibrillation (HCC)    Atrial tachycardia (HCC)    Autonomic dysfunction    Complication of anesthesia    pt states wakes up with "shakes"   CVA (cerebral infarction)    2012 with dizziness and vision change felt embolic from atrial tachy   Disorder of bone and cartilage, unspecified    Diverticulosis    DM (diabetes mellitus) (HCC)    DVT (deep venous thrombosis) (HCC)    Eczema    Family history of anesthesia complication    PONV and " shaking "   Fatty liver    GERD (gastroesophageal reflux disease)    History of hiatal hernia    History of radiation therapy 10/24/2012   brain   HLD (hyperlipidemia)    HTN (hypertension)    Hypoglycemia, unspecified    Liver metastasis    Lung metastasis    Metastasis to brain (HCC)    Osteopenia    Other nonspecific abnormal serum enzyme levels    Other specified congenital anomalies of nervous system    Pacemaker    autonomic dysfunction   Pancreatitis    from therapy   Peripheral vascular disease (HCC)    POTS (postural orthostatic tachycardia syndrome)    Raynaud's disease    due to chemo   Skin cancer    Hx: of lung lesion   Past Surgical History:  Procedure Laterality Date   ABLATION SAPHENOUS VEIN W/ RFA     2002 x3   CARPAL TUNNEL RELEASE Left 2000   CHOLECYSTECTOMY N/A 03/28/2018   Procedure:  LAPAROSCOPIC CHOLECYSTECTOMY WITH INTRAOPERATIVE CHOLANGIOGRAM;  Surgeon: Luretha Murphy, MD;  Location: [facility] OR;  Service: General;  Laterality: N/A;   CRANIOTOMY N/A 09/30/2012   suboccipital craniectomy   CRANIOTOMY Left 02/09/2013   Procedure: LEFT PARIETAL CRANIOTOMY with stealth;  Surgeon: Barnett Abu, MD;  Location: [facility];  Service: Neurosurgery;  Laterality: Left;  LEFT Parietal Craniotomy for tumor with stealth   DEEP AXILLARY SENTINEL NODE BIOPSY / EXCISION     due to extensive Melanoma-right arm   fatty tumor removed  2000   from chest   INSERT / REPLACE / REMOVE PACEMAKER  2012   1999, x 3   LAPAROSCOPY  09/06/2011   Procedure: LAPAROSCOPY OPERATIVE;  Surgeon: Tresa Endo A. Ernestina Penna, MD;  Location: [facility] ORS;  Service: Gynecology;  Laterality: Left;  with Left Ovarian Cystectomy    MELANOMA EXCISION     with removal of lymph nodes, left shoulder   NOSE SURGERY  2010, 2012   for nose bleeds x 2   SINOSCOPY     TONSILLECTOMY AND ADENOIDECTOMY     TOTAL HIP ARTHROPLASTY Right 05/18/2022   Procedure:  RIGHT TOTAL HIP ARTHROPLASTY ANTERIOR APPROACH;  Surgeon: Kathryne Hitch, MD;  Location: [facility] ORS;  Service: Orthopedics;  Laterality: Right;   Patient Active Problem List   Diagnosis Date Noted   Status post total replacement of right hip 05/18/2022   Avascular necrosis of bone of right hip (HCC) 04/19/2022   Moderate persistent asthma 12/08/2018   Perennial allergic rhinitis with predominantly nonallergic component 12/08/2018   Allergic conjunctivitis 12/08/2018   History of epistaxis 12/08/2018   Atopic dermatitis 12/08/2018   Recurrent urticaria 12/08/2018   Skeeter syndrome 12/08/2018   Skin lesion of chest wall 07/22/2018   S/P laparoscopic cholecystectomy 03/28/2018   Dehydration 05/18/2016   Type 2 diabetes mellitus without complication, without long-term current use of insulin (HCC) 11/01/2015   History of pacemaker    POTS (postural orthostatic tachycardia  syndrome)    Iron deficiency anemia 11/11/2014   Chronic colitis 08/28/2013   Malignant melanoma, metastatic (HCC) 02/09/2013   Malignant melanoma (HCC) 12/01/2012   Malignant neoplasm metastatic to brain (HCC) 10/16/2012   Hypertension 09/30/2012   Anxiety state 09/30/2012   Hypoxemia 09/30/2012   Disorder of brain 09/08/2012   Dysautonomia (HCC) 02/25/2011   Anticoagulant long-term use 01/19/2011   Neuritis 01/19/2011   Skin change 01/19/2011   Cerebral infarction (HCC)    Palpitation 06/28/2010   Pacemaker-Biotronik 06/28/2010   Dizziness and giddiness 04/27/2010   DIVERTICULOSIS, COLON, HX OF 01/19/2009   CAROTID SINUS SYNDROME 11/17/2008   OTHER MALAISE AND FATIGUE 11/17/2008   HYPERTENSION, BENIGN 11/02/2008   ATRIAL TACHYCARDIA 09/16/2008   SYNCOPE, HX OF 08/09/2008   INJURY UNSPEC NERVE SHOULDER GIRDLE&UPPER LIMB 07/12/2008   PERSONAL HISTORY OF MALIGNANT MELANOMA OF SKIN 07/12/2008   LUQ PAIN 12/08/2007   NECK PAIN 09/26/2007   NUMBNESS, ARM 09/26/2007   CPK, ABNORMAL 09/26/2007   ARM PAIN, LEFT 06/25/2007   SWELLING OF LIMB 06/25/2007   OSTEOPENIA 06/25/2007    PCP: Madelin Headings, MD   REFERRING PROVIDER: Kathryne Hitch, MD  REFERRING DIAG: [phone] (ICD-10-CM) - It band syndrome, left  THERAPY DIAG:  Pain in left hip  Muscle weakness (generalized)  Unsteadiness on feet  Pain in right hip  Rationale for Evaluation and Treatment: Rehabilitation  ONSET DATE:  2-3 months (prior to IE)  NEXT MD VISIT: 02/13/23   SUBJECTIVE:   SUBJECTIVE STATEMENT: Hips smarting from walking 5 miles on Saturday.    PERTINENT HISTORY:  anxiety, asthma, DM-II, HTN, CVA, DVT, POTS, pacemaker, malignant melanoma with brain, liver and lung metastasis s/p radiation and L parietal craniotomy, osteopenia, diverticulitis, GERD, and Raynaud's disease. R THA 05/19/22 PAIN:  Are you having pain? Yes: NPRS scale: 7/10 Pain location: R hip Pain description:  sore Aggravating factors: walking Relieving factors: rest  PRECAUTIONS: ICD/Pacemaker and Other: POTS, advised to stay out of heat, has LE lymphedema  RED FLAGS: None   WEIGHT BEARING RESTRICTIONS: No  FALLS:  Has patient fallen in last 6 months? No  LIVING ENVIRONMENT: Lives with: lives with their spouse Lives in: House/apartment Stairs: Yes: Internal: 13 steps; on right going up, on left going up, and can reach both Has following equipment at home: Single point cane, Walker - 2 wheeled, and shower chair  OCCUPATION: medically disabled due to cancer.   PLOF: Independent walking 2-3 miles, 3-4x/week  PATIENT GOALS: be able to get up from floor, work on balance, decrease L hip pain.     OBJECTIVE:   DIAGNOSTIC FINDINGS: 10/08/2022 XR Bilateral Hips An AP pelvis  and lateral both hips shows a well-seated right total hip  arthroplasty.  There is no evidence of AVN with the left hip.   PATIENT SURVEYS:  LEFS 46/80  COGNITION: Overall cognitive status: Within functional limits for tasks assessed     SENSATION: Neuropathy bil feet  EDEMA:  History bil LE edema- being treated at lymphedema clinic  MUSCLE LENGTH: Hamstrings: moderate tightness bilaterally  POSTURE:  weight shift left and leg length discrepancy with R LE longer than L  PALPATION: Tenderness over L glutes, L piriforomis, L greater trochanter, along L Itband.   LOWER EXTREMITY ROM:  Active ROM Right eval Left eval  Hip flexion 140 140  Hip extension    Hip internal rotation 25 25  Hip external rotation 60 60   (Blank rows = not tested)  LOWER EXTREMITY MMT:  MMT Right eval Left eval  Hip flexion 5 5  Hip extension 5 4  Hip abduction 5  5   Hip adduction 5 (seated) 5 (seated)  Hip internal rotation    Hip external rotation    Knee flexion 5 5  Knee extension 5 5  Ankle dorsiflexion 5 5  Ankle plantarflexion 5 5   (Blank rows = not tested)  LOWER EXTREMITY SPECIAL TESTS:  Hip special  tests: Ober's test: positive for pain  FUNCTIONAL TESTS:  5 times sit to stand: 14.5 sec  DGI - 24/24  GAIT: Distance walked: 50 Assistive device utilized: None Level of assistance: Complete Independence Comments: visually slow gait speed.    TODAY'S TREATMENT:                                                                                                                              DATE:   12/17/22 Therapeutic Exercise: to improve strength and mobility.  Demo, verbal and tactile cues throughout for technique. Bike L1 x 6 min  Leg Press 20# 3 x 10 Rows 15# 3 x 10  Leg extension 15# 2 x 10  Hamstring curls 15# 2 x 10  Levator stretches 2 x 15 sec    12/13/22 Therapeutic Exercise: to improve strength and mobility.  Demo, verbal and tactile cues throughout for technique. Nustep L5 x 5 min  Standing hip openers 2 x 8 bil  Standing lunges - foot on towel  x 10 forward, lateral and back, bil  Supine bridge x 10, bridge with feet on bosu 2 x 10  Manual Therapy: to decrease muscle spasm and pain and improve mobility IASTM to R hip flexors, scar tissue mobilization, Itband to decrease pain and spasm.    12/10/22 Therapeutic Activity:  assessing progress towards goals and concerns Nustep L5 x 5 min for warm-up and subjective 5xSTS Concerns with R and L hip and activity modification Manual Therapy: to decrease muscle spasm and pain and improve mobility Manual edema resorption to R hip around TFL, scar tissue massage, STM/TPR to R hamstrings and quads around [employer]  PATIENT EDUCATION:  Education details: HEP review Person educated: Patient Education method: Medical illustrator Education comprehension: verbalized understanding and returned demonstration  HOME EXERCISE PROGRAM: Access Code: [email or web address] URL: [email or web address] Date: 11/22/2022 Prepared by: Harrie Foreman  Exercises - Supine ITB Stretch  - 1 x daily - 7 x weekly - 1 sets - 3 reps  - 30 sec  hold - Supine Hip Internal and External Rotation  - 1 x daily - 7 x weekly - 2 sets - 10 reps - Supine Bridge with Mini Swiss Ball Between Knees  - 1 x daily - 7 x weekly - 2 sets - 10 reps - Prone Hip Extension with Bent Knee  - 1 x daily - 7 x weekly - 2 sets - 10 reps - ITB Stretch at Wall  - 1 x daily - 7 x weekly - 1 sets - 3 reps - 30 sec hold - Standing Hip Flexor Stretch  - 1 x daily - 7 x weekly - 1 sets - 3 reps - 30 sec hold - Squat with Chair Touch  - 3 x daily - 7 x weekly - 2 sets - 5 reps - Lunge Matrix on Slider  - 1 x daily - 7 x weekly - 2-3 sets - 10 reps - Single Leg Balance with Clock Reach  - 1 x daily - 7 x weekly - 3 sets - 5 reps   ASSESSMENT:  CLINICAL IMPRESSION: Jeannemarie continues to report increased hip pain with walking "what are we going to do about it."  Discussed today trying to find other aerobic activities to alternate with walking - biking, swimming, and also adding resistance training to build muscle as another way to increase metabolism besides just aerobic training.  Today trialed several different machines, tolerated well except leg extensions, reported discomfort initially at 20#, when weight reduced to 15# she was fine.  She did report some R upper back pain and had palpable spasm in R Levator scapulae - reassured it was a spasm and given stretch to address.  Marquesa Traina Diemer continues to demonstrate potential for improvement and would benefit from continued skilled therapy to address impairments.   .     OBJECTIVE IMPAIRMENTS: Abnormal gait, decreased activity tolerance, decreased balance, decreased endurance, decreased mobility, difficulty walking, decreased strength, impaired flexibility, and pain.   ACTIVITY LIMITATIONS: carrying, lifting, bending, standing, squatting, sleeping, and locomotion level  PARTICIPATION LIMITATIONS: meal prep, cleaning, laundry, and shopping  PERSONAL FACTORS: 3+ comorbidities: anxiety, asthma, DM-II, HTN, CVA,  DVT, POTS, pacemaker, malignant melanoma with brain, liver and lung metastasis s/p radiation and L parietal craniotomy, osteopenia, diverticulitis, GERD, and Raynaud's disease. R THA 05/19/22  are also affecting patient's functional outcome.   REHAB POTENTIAL: Good  CLINICAL DECISION MAKING: Evolving/moderate complexity  EVALUATION COMPLEXITY: Moderate   GOALS: Goals reviewed with patient? Yes  SHORT TERM GOALS: Target date: 11/07/2022   Patient will be independent with initial HEP. Baseline: given 11/01/22 Goal status: MET  11/15/22  2.  Patient will undergo further balance testing Baseline:  Goal status: MET  11/01/22 - competed DGI  LONG TERM GOALS: Target date: 12/19/2022 extended to 01/07/2023  Patient will be independent with advanced/ongoing HEP to improve outcomes and carryover.  Baseline:  Goal status: IN PROGRESS 12/10/22- met for current  2.  Patient will report at least 75% improvement in Left hip pain to improve QOL. Baseline:  Goal status: MET 12/10/22- 100% improvement  3.  Patient will demonstrate improved functional LE strength  as demonstrated by 5x STS <14 seconds to decrease fall risk. Baseline: NT Goal status: MET 12/10/22 13.5 seconds  4.  Patient will report at least 9 points improvement on LEFS to demonstrate improved functional ability. Baseline: 46/80 Goal status: IN PROGRESS  5.  Patient will demonstrate at least 19/24 on DGI to decrease risk of falls. Baseline: NT Goal status: MET  11/01/22 - 24/24  6.  Patient will be able to perform floor to stand transfer without use of furniture to assist safely.  Baseline: gets "stuck" when squatting down to pick up item, has to crawl to get back up.  Goal status: IN PROGRESS  11/19/22- able to perform with supervision today.    PLAN:  PT FREQUENCY: 1-2x/week  PT DURATION: 8 weeks  PLANNED INTERVENTIONS: Therapeutic exercises, Therapeutic activity, Neuromuscular re-education, Balance training, Gait  training, Patient/Family education, Self Care, Joint mobilization, Stair training, Orthotic/Fit training, Dry Needling, Spinal mobilization, Moist heat, Taping, Traction, Manual therapy, and Re-evaluation  PLAN FOR NEXT SESSION: practice floor transfers per pt request, continue manual and TrDN, review and progress HEP for LE strengthening and balance.    Jena Gauss, PT 12/17/2022, 6:05 PM

## 2022-12-18 ENCOUNTER — Ambulatory Visit: Payer: Medicare Other | Attending: Internal Medicine | Admitting: Internal Medicine

## 2022-12-18 ENCOUNTER — Encounter: Payer: Self-pay | Admitting: Internal Medicine

## 2022-12-18 VITALS — BP 102/65 | HR 61 | Wt 158.0 lb

## 2022-12-18 DIAGNOSIS — I495 Sick sinus syndrome: Secondary | ICD-10-CM | POA: Diagnosis not present

## 2022-12-18 DIAGNOSIS — Z950 Presence of cardiac pacemaker: Secondary | ICD-10-CM

## 2022-12-18 DIAGNOSIS — I4719 Other supraventricular tachycardia: Secondary | ICD-10-CM | POA: Diagnosis not present

## 2022-12-18 DIAGNOSIS — G901 Familial dysautonomia [Riley-Day]: Secondary | ICD-10-CM

## 2022-12-18 MED ORDER — LABETALOL HCL 100 MG PO TABS: 50.0000 mg | ORAL_TABLET | Freq: Two times a day (BID) | ORAL | 3 refills | Status: DC

## 2022-12-18 NOTE — Patient Instructions (Addendum)
Medication Instructions:  Your physician has recommended you make the following change in your medication:   ** Stop Metoprolol  ** Begin Labetalol 100mg  - 1/2 tablet by mouth twice daily.   *If you need a refill on your cardiac medications before your next appointment, please call your pharmacy*   Lab Work: None ordered.  If you have labs (blood work) drawn today and your tests are completely normal, you will receive your results only by: MyChart Message (if you have MyChart) OR A paper copy in the mail If you have any lab test that is abnormal or we need to change your treatment, we will call you to review the results.   Testing/Procedures: None ordered.    Follow-Up: At [facility], you and your health needs are our priority.  As part of our continuing mission to provide you with exceptional heart care, we have created designated Provider Care Teams.  These Care Teams include your primary Cardiologist (physician) and Advanced Practice Providers (APPs -  Physician Assistants and Nurse Practitioners) who all work together to provide you with the care you need, when you need it.  We recommend signing up for the patient portal called "MyChart".  Sign up information is provided on this After Visit Summary.  MyChart is used to connect with patients for Virtual Visits (Telemedicine).  Patients are able to view lab/test results, encounter notes, upcoming appointments, etc.  Non-urgent messages can be sent to your provider as well.   To learn more about what you can do with MyChart, go to [email or web address].    Your next appointment:   6 months with Dr Graciela Husbands

## 2022-12-18 NOTE — Progress Notes (Signed)
Either [name] next week Patient Care Team: Amanda Headings, MD as PCP - General Duke Salvia, MD as PCP - Electrophysiology (Cardiology) Amanda Abu, MD as Consulting Physician (Neurosurgery) Amanda Artist, MD as Consulting Physician (Oncology) Amanda Rack, MD as Referring Physician (Internal Medicine) Amanda Shanks, MD as Consulting Physician (Otolaryngology) Amanda Resides, MD as Consulting Physician (Dermatology) [name], Amanda Montgomery, MD as Consulting Physician (Allergy and Immunology) [name], [facility] (Pharmacist)   HPI  [name] is a 67 y.o. female Seen in follow-up for dysautonomia as well as pacemaker-Biotronik implanted for dysautonomia and bradycardia for device interrogation Hx of .  She was seen a few weeks ago at the time of her MRI  The patient denies chest pain , shortness of breath, nocturnal dyspnea, orthopnea or peripheral edema.  There have been no syncope.    Complains of cold hands and feet.  Arterial Dopplers of upper lower extremities were normal; perhaps aggravated by cold.  Difficult warm up.  Somewhat painful.  Status post hip arthroplasty 3/24 complicated by trochanteric bursitis and IT band syndrome Intercurrently diagnosed with lymphedema and undergoing leg wrapping, hypothesized because of a history of prior lymph node resection and asymmetric edema right greater than left.  Then developed bilateral edema still right greater than left, recalling that her surgery was on the right.  She saw her PCP and received 3 doses of furosemide (20 mg) with brisk diuresis relief of swelling but complicated by what she thought was hypomagnesemia as she had cramping and it had this before  Venous Doppler was negative   She has been struggling but not so much from dysautonomia.  Apparently at the time of her hip replacement, they were unable to succeed the ball as deeply as it should resulting in what she describes as a 2-1/2 cm leg length discrepancy  which has aggravated her imbalance, gait stability.  She continues to struggle with swelling.  Also having unusual sensations in her chest vaguely like palpitations.    DATE TEST EF   4/22 Echo   60-65 %   7/24 Chest CT    No thoracic mets Not gated for PE   Date Cr K Hgb  3/24 0.58 3.9 12.1           Past Medical History:  Diagnosis Date   Anemia    Angio-edema    Anxiety    Arthritis    Asthma    Atrial fibrillation (HCC)    Atrial tachycardia (HCC)    Autonomic dysfunction    Complication of anesthesia    pt states wakes up with "shakes"   CVA (cerebral infarction)    2012 with dizziness and vision change felt embolic from atrial tachy   Disorder of bone and cartilage, unspecified    Diverticulosis    DM (diabetes mellitus) (HCC)    DVT (deep venous thrombosis) (HCC)    Eczema    Family history of anesthesia complication    PONV and " shaking "   Fatty liver    GERD (gastroesophageal reflux disease)    History of hiatal hernia    History of radiation therapy 10/24/2012   brain   HLD (hyperlipidemia)    HTN (hypertension)    Hypoglycemia, unspecified    Liver metastasis    Lung metastasis    Metastasis to brain (HCC)    Osteopenia    Other nonspecific abnormal serum enzyme levels    Other specified congenital anomalies of nervous system  Pacemaker    autonomic dysfunction   Pancreatitis    from therapy   Peripheral vascular disease (HCC)    POTS (postural orthostatic tachycardia syndrome)    Raynaud's disease    due to chemo   Skin cancer    Hx: of lung lesion    Past Surgical History:  Procedure Laterality Date   ABLATION SAPHENOUS VEIN W/ RFA     2002 x3   CARPAL TUNNEL RELEASE Left 2000   CHOLECYSTECTOMY N/A 03/28/2018   Procedure: LAPAROSCOPIC CHOLECYSTECTOMY WITH INTRAOPERATIVE CHOLANGIOGRAM;  Surgeon: Luretha Murphy, MD;  Location: [facility] OR;  Service: General;  Laterality: N/A;   CRANIOTOMY N/A 09/30/2012   suboccipital craniectomy    CRANIOTOMY Left 02/09/2013   Procedure: LEFT PARIETAL CRANIOTOMY with stealth;  Surgeon: Amanda Abu, MD;  Location: [facility];  Service: Neurosurgery;  Laterality: Left;  LEFT Parietal Craniotomy for tumor with stealth   DEEP AXILLARY SENTINEL NODE BIOPSY / EXCISION     due to extensive Melanoma-right arm   fatty tumor removed  2000   from chest   INSERT / REPLACE / REMOVE PACEMAKER  2012   1999, x 3   LAPAROSCOPY  09/06/2011   Procedure: LAPAROSCOPY OPERATIVE;  Surgeon: Tresa Endo A. Ernestina Penna, MD;  Location: [facility] ORS;  Service: Gynecology;  Laterality: Left;  with Left Ovarian Cystectomy    MELANOMA EXCISION     with removal of lymph nodes, left shoulder   NOSE SURGERY  2010, 2012   for nose bleeds x 2   SINOSCOPY     TONSILLECTOMY AND ADENOIDECTOMY     TOTAL HIP ARTHROPLASTY Right 05/18/2022   Procedure: RIGHT TOTAL HIP ARTHROPLASTY ANTERIOR APPROACH;  Surgeon: Kathryne Hitch, MD;  Location: [facility] ORS;  Service: Orthopedics;  Laterality: Right;    Current Outpatient Medications  Medication Sig Dispense Refill   acetaminophen (TYLENOL) 500 MG tablet Take 500 mg by mouth See admin instructions. Take 500 mg by mouth at bedtime and an additional 500 mg three times a day as needed for pain     albuterol (PROVENTIL) (2.5 MG/3ML) 0.083% nebulizer solution Take 3 mLs (2.5 mg total) by nebulization every 4 (four) hours as needed for wheezing or shortness of breath. 180 mL 1   albuterol (VENTOLIN HFA) 108 (90 Base) MCG/ACT inhaler INHALE 2 PUFFS INTO THE LUNGS EVERY 4 (FOUR) HOURS AS NEEDED FOR WHEEZING OR SHORTNESS OF BREATH. 18 Inhaler 0   Ascorbic Acid (VITAMIN C) 1000 MG tablet Take 2,000 mg by mouth daily.     aspirin 81 MG chewable tablet Chew 1 tablet (81 mg total) by mouth 2 (two) times daily. 35 tablet 0   augmented betamethasone dipropionate (DIPROLENE) 0.05 % ointment Apply topically 2 (two) times daily. Limit to no longer than 2 weeks use. Not on face. (Patient taking differently:  Apply 1 Application topically 2 (two) times daily as needed (hives). Limit to no longer than 2 weeks use. Not on face.) 50 g 0   benzonatate (TESSALON) 200 MG capsule Take 1 capsule (200 mg total) by mouth 2 (two) times daily as needed for cough. 20 capsule 0   budesonide (ENTOCORT EC) 3 MG 24 hr capsule Take 3 mg by mouth as needed for rash.     calcium carbonate (TUMS EX) 750 MG chewable tablet Chew 1 tablet by mouth daily.     clobetasol cream (TEMOVATE) 0.05 % Apply 1 application topically 2 (two) times daily as needed (rash).     clonazePAM (KLONOPIN) 1 MG  tablet TAKE ONE TABLET BY MOUTH 2-3 TIMES A DAY AS NEEDED 70 tablet 0   cloNIDine (CATAPRES) 0.1 MG tablet TAKE 1 TABLET BY MOUTH AT BEDTIME. 90 tablet 2   cyanocobalamin (VITAMIN B12) 1000 MCG/ML injection INJECT INTRAMUSCULARLY EVERY 30 DAYS 3 mL 4   Desoximetasone (TOPICORT) 0.25 % ointment Apply 1 application topically 2 (two) times daily. (Patient taking differently: Apply 1 application  topically 2 (two) times daily as needed (rash).) 30 g 1   diphenhydrAMINE (BENADRYL) 25 MG tablet Take 25-50 mg by mouth daily as needed for allergies.     EPINEPHrine 0.3 mg/0.3 mL IJ SOAJ injection Inject 0.3 mg into the muscle as needed for anaphylaxis.     fexofenadine (ALLEGRA) 180 MG tablet Take 180 mg by mouth daily as needed for allergies or rhinitis.     fluticasone (FLONASE) 50 MCG/ACT nasal spray Place 2 sprays into both nostrils daily. (Patient taking differently: Place 2 sprays into both nostrils daily as needed for allergies.) 16 g 0   furosemide (LASIX) 20 MG tablet TAKE 1 TABLET BY MOUTH DAILY AS NEEDED 90 tablet 3   HYDROcodone-acetaminophen (NORCO/VICODIN) 5-325 MG tablet Take 1-2 tablets by mouth every 6 (six) hours as needed for moderate pain. 30 tablet 0   hydrocortisone (ANUSOL-HC) 25 MG suppository Place 25 mg rectally as needed for hemorrhoids.     ibuprofen (ADVIL) 800 MG tablet Take 1 tablet (800 mg total) by mouth every 8  (eight) hours as needed. 60 tablet 3   levETIRAcetam (KEPPRA) 250 MG tablet Take 250 mg by mouth See admin instructions. Take 250 mg by mouth at 8 AM, 2 PM, and 8 PM     levocetirizine (XYZAL) 5 MG tablet TAKE 1 TABLET BY MOUTH EVERY DAY AS NEEDED 30 tablet 0   lidocaine (XYLOCAINE) 2 % solution 5mL swish and swallow as needed for sore throat; take with nystatin susp 200 mL 0   methocarbamol (ROBAXIN) 500 MG tablet Take 1 tablet (500 mg total) by mouth every 6 (six) hours as needed for muscle spasms. 40 tablet 1   metoprolol tartrate (LOPRESSOR) 25 MG tablet TAKE 1 TABLET BY MOUTH TWICE A DAY 180 tablet 1   nystatin (MYCOSTATIN) 100000 UNIT/ML suspension Use as directed 5 mLs (500,000 Units total) in the mouth or throat 3 (three) times daily as needed (thrush). (Patient taking differently: Use as directed 3 mLs in the mouth or throat 3 (three) times daily as needed (thrush).) 60 mL 2   Olopatadine HCl (PATADAY) 0.2 % SOLN Use one drop in each eye once daily as needed. (Patient taking differently: Place 1 drop into both eyes daily as needed (itchy eyes).) 2.5 mL 5   ondansetron (ZOFRAN-ODT) 4 MG disintegrating tablet Take 4 mg by mouth every 8 (eight) hours as needed for nausea/vomiting.     polyethylene glycol (MIRALAX / GLYCOLAX) packet Take 17 g by mouth at bedtime.     potassium chloride SA (KLOR-CON M) 20 MEQ tablet Take 1 tablet (20 mEq total) by mouth 2 (two) times daily. (Patient taking differently: Take 20 mEq by mouth See admin instructions. Take 20 mEq by mouth at 8 AM and 2 PM) 180 tablet 2   Probiotic Product (PROBIOTIC PO) Take 2 capsules by mouth daily.     prochlorperazine (COMPAZINE) 10 MG tablet Take 1 tablet (10 mg total) by mouth every 6 (six) hours as needed for nausea or vomiting. 60 tablet 0   sodium chloride (OCEAN) 0.65 % SOLN nasal  spray Place 1 spray into both nostrils 2 (two) times daily.     Spacer/Aero-Holding Chambers [facility] DIAMOND) MISC See admin instructions.      Syringe/Needle, Disp, (SYRINGE 3CC/27GX1-1/4") 27G X 1-1/4" 3 ML MISC Inject 1 mL into the muscle every 30 (thirty) days. Vit B12 12 each 0   tiZANidine (ZANAFLEX) 2 MG tablet Take 1 tablet (2 mg total) by mouth every 6 (six) hours as needed for muscle spasms. 30 tablet 0   Vitamin D, Ergocalciferol, (DRISDOL) 50000 units CAPS capsule TAKE 1 CAPSULE (50,000 UNITS TOTAL) BY MOUTH EVERY 7 (SEVEN) DAYS. (Patient taking differently: Take 50,000 Units by mouth every Saturday.) 4 capsule 11   Zinc 50 MG TABS Take 50 mg by mouth daily.     Current Facility-Administered Medications  Medication Dose Route Frequency Provider Last Rate Last Admin   mupirocin ointment (BACTROBAN) 2 %   Topical Daily Kathryne Hitch, MD        Allergies  Allergen Reactions   Bee Venom Swelling   Doxycycline Hives   Erythromycin Hives   Gadavist [Gadobutrol] Hives, Shortness Of Breath and Itching   Paxlovid [Nirmatrelvir-Ritonavir] Swelling and Other (See Comments)    Mouth swelling, mouth ulcers   Penicillins Hives    Did it involve swelling of the face/tongue/throat, SOB, or low BP? No Did it involve sudden or severe rash/hives, skin peeling, or any reaction on the inside of your mouth or nose? Yes Did you need to seek medical attention at a hospital or doctor's office? Yes When did it last happen?      5+ years If all above answers are "NO", may proceed with cephalosporin use.    Sulfa Antibiotics Hives   Vancomycin Hives, Rash and Other (See Comments)    Develops "Red Man" Syndrome and welts/hives if it is run too quickly via IV- must be run slowly   Preparation H [Pramox-Pe-Glycerin-Petrolatum] Hives    Cannot use  Cream or Suppositories    Phenergan [Promethazine Hcl] Other (See Comments)    Causes restless leg syndrome    Review of Systems negative except from HPI and PMH  Physical Exam BP 102/65 (Patient Position: Standing)   Pulse 61   Wt 158 lb (71.7 kg)   SpO2 97%   BMI 27.99 kg/m   Well developed and well nourished in no acute distress HENT normal Neck supple with JVP-flat Clear Device pocket well healed; without hematoma or erythema.  There is no tethering  Regular rate and rhythm, no  gallop No  murmur Abd-soft with active BS No Clubbing cyanosis tr who is asking edema Skin-warm and dry A & Oriented  Grossly normal sensory and motor function  ECG none  Device function is normal. 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  Raynaud's is also problematic.  Was on labetalol but she thought it was a responsible for the swelling she developed.  So she stopped it.  She went back on her metoprolol.  We reviewed alpha/beta blockade and we will discontinue the metoprolol and have her resume the labetalol  Having palpitations.  Use her Apple Watch today to help clarify

## 2022-12-20 ENCOUNTER — Encounter: Payer: Self-pay | Admitting: Physical Therapy

## 2022-12-24 ENCOUNTER — Encounter: Payer: Self-pay | Admitting: Physical Therapy

## 2022-12-24 ENCOUNTER — Ambulatory Visit: Payer: Medicare Other | Admitting: Physical Therapy

## 2022-12-24 DIAGNOSIS — M6281 Muscle weakness (generalized): Secondary | ICD-10-CM | POA: Diagnosis not present

## 2022-12-24 DIAGNOSIS — M25551 Pain in right hip: Secondary | ICD-10-CM | POA: Diagnosis not present

## 2022-12-24 DIAGNOSIS — M25552 Pain in left hip: Secondary | ICD-10-CM | POA: Diagnosis not present

## 2022-12-24 DIAGNOSIS — R2681 Unsteadiness on feet: Secondary | ICD-10-CM | POA: Diagnosis not present

## 2022-12-24 NOTE — Therapy (Signed)
OUTPATIENT PHYSICAL THERAPY TREATMENT    Patient Name: [name] MRN: [id number] DOB:[birth year], 67 y.o., female Today's Date: 12/24/2022  END OF SESSION:  PT End of Session - 12/24/22 1529     Visit Number 13    Date for PT Re-Evaluation 01/07/23    Authorization Type [employer]    Authorization Time Period 10/31/22-12/26/22    Authorization - Visit Number 12    Authorization - Number of Visits 12    PT Start Time 1530    PT Stop Time 1615    PT Time Calculation (min) 45 min    Activity Tolerance Patient tolerated treatment well    Behavior During Therapy WFL for tasks assessed/performed              Past Medical History:  Diagnosis Date   Anemia    Angio-edema    Anxiety    Arthritis    Asthma    Atrial fibrillation (HCC)    Atrial tachycardia (HCC)    Autonomic dysfunction    Complication of anesthesia    pt states wakes up with "shakes"   CVA (cerebral infarction)    2012 with dizziness and vision change felt embolic from atrial tachy   Disorder of bone and cartilage, unspecified    Diverticulosis    DM (diabetes mellitus) (HCC)    DVT (deep venous thrombosis) (HCC)    Eczema    Family history of anesthesia complication    PONV and " shaking "   Fatty liver    GERD (gastroesophageal reflux disease)    History of hiatal hernia    History of radiation therapy 10/24/2012   brain   HLD (hyperlipidemia)    HTN (hypertension)    Hypoglycemia, unspecified    Liver metastasis    Lung metastasis    Metastasis to brain (HCC)    Osteopenia    Other nonspecific abnormal serum enzyme levels    Other specified congenital anomalies of nervous system    Pacemaker    autonomic dysfunction   Pancreatitis    from therapy   Peripheral vascular disease (HCC)    POTS (postural orthostatic tachycardia syndrome)    Raynaud's disease    due to chemo   Skin cancer    Hx: of lung lesion   Past Surgical History:  Procedure Laterality Date   ABLATION  SAPHENOUS VEIN W/ RFA     2002 x3   CARPAL TUNNEL RELEASE Left 2000   CHOLECYSTECTOMY N/A 03/28/2018   Procedure: LAPAROSCOPIC CHOLECYSTECTOMY WITH INTRAOPERATIVE CHOLANGIOGRAM;  Surgeon: Luretha Murphy, MD;  Location: [facility] OR;  Service: General;  Laterality: N/A;   CRANIOTOMY N/A 09/30/2012   suboccipital craniectomy   CRANIOTOMY Left 02/09/2013   Procedure: LEFT PARIETAL CRANIOTOMY with stealth;  Surgeon: Barnett Abu, MD;  Location: [facility];  Service: Neurosurgery;  Laterality: Left;  LEFT Parietal Craniotomy for tumor with stealth   DEEP AXILLARY SENTINEL NODE BIOPSY / EXCISION     due to extensive Melanoma-right arm   fatty tumor removed  2000   from chest   INSERT / REPLACE / REMOVE PACEMAKER  2012   1999, x 3   LAPAROSCOPY  09/06/2011   Procedure: LAPAROSCOPY OPERATIVE;  Surgeon: Tresa Endo A. Ernestina Penna, MD;  Location: [facility];  Service: Gynecology;  Laterality: Left;  with Left Ovarian Cystectomy    MELANOMA EXCISION     with removal of lymph nodes, left shoulder   NOSE SURGERY  2010, 2012   for nose bleeds  x 2   SINOSCOPY     TONSILLECTOMY AND ADENOIDECTOMY     TOTAL HIP ARTHROPLASTY Right 05/18/2022   Procedure: RIGHT TOTAL HIP ARTHROPLASTY ANTERIOR APPROACH;  Surgeon: Kathryne Hitch, MD;  Location: [facility];  Service: Orthopedics;  Laterality: Right;   Patient Active Problem List   Diagnosis Date Noted   Status post total replacement of right hip 05/18/2022   Avascular necrosis of bone of right hip (HCC) 04/19/2022   Moderate persistent asthma 12/08/2018   Perennial allergic rhinitis with predominantly nonallergic component 12/08/2018   Allergic conjunctivitis 12/08/2018   History of epistaxis 12/08/2018   Atopic dermatitis 12/08/2018   Recurrent urticaria 12/08/2018   Skeeter syndrome 12/08/2018   Skin lesion of chest wall 07/22/2018   S/P laparoscopic cholecystectomy 03/28/2018   Dehydration 05/18/2016   Type 2 diabetes mellitus without complication, without  long-term current use of insulin (HCC) 11/01/2015   History of pacemaker    POTS (postural orthostatic tachycardia syndrome)    Iron deficiency anemia 11/11/2014   Chronic colitis 08/28/2013   Malignant melanoma, metastatic (HCC) 02/09/2013   Malignant melanoma (HCC) 12/01/2012   Malignant neoplasm metastatic to brain (HCC) 10/16/2012   Hypertension 09/30/2012   Anxiety state 09/30/2012   Hypoxemia 09/30/2012   Disorder of brain 09/08/2012   Dysautonomia (HCC) 02/25/2011   Anticoagulant long-term use 01/19/2011   Neuritis 01/19/2011   Skin change 01/19/2011   Cerebral infarction (HCC)    Palpitation 06/28/2010   Pacemaker-Biotronik 06/28/2010   Dizziness and giddiness 04/27/2010   DIVERTICULOSIS, COLON, HX OF 01/19/2009   CAROTID SINUS SYNDROME 11/17/2008   OTHER MALAISE AND FATIGUE 11/17/2008   HYPERTENSION, BENIGN 11/02/2008   ATRIAL TACHYCARDIA 09/16/2008   SYNCOPE, HX OF 08/09/2008   INJURY UNSPEC NERVE SHOULDER GIRDLE&UPPER LIMB 07/12/2008   PERSONAL HISTORY OF MALIGNANT MELANOMA OF SKIN 07/12/2008   LUQ PAIN 12/08/2007   NECK PAIN 09/26/2007   NUMBNESS, ARM 09/26/2007   CPK, ABNORMAL 09/26/2007   ARM PAIN, LEFT 06/25/2007   SWELLING OF LIMB 06/25/2007   OSTEOPENIA 06/25/2007    PCP: Madelin Headings, MD   REFERRING PROVIDER: Kathryne Hitch, MD  REFERRING DIAG: [phone] (ICD-10-CM) - It band syndrome, left  THERAPY DIAG:  Pain in left hip  Muscle weakness (generalized)  Unsteadiness on feet  Pain in right hip  Rationale for Evaluation and Treatment: Rehabilitation  ONSET DATE:  2-3 months (prior to IE)  NEXT MD VISIT: 02/13/23   SUBJECTIVE:   SUBJECTIVE STATEMENT: Hobbling a little, Itband on left hurting because walked a lot on Thursday and Friday had to go to [place], then walked for exercise Saturday and Sunday.  Tried weights Saturday at gym but hurt neck.      Right leg feels heavier today.    5/10 R, 3/10 L hip pain  PERTINENT  HISTORY:  anxiety, asthma, DM-II, HTN, CVA, DVT, POTS, pacemaker, malignant melanoma with brain, liver and lung metastasis s/p radiation and L parietal craniotomy, osteopenia, diverticulitis, GERD, and Raynaud's disease. R THA 05/19/22 PAIN:  Are you having pain? Yes: NPRS scale: 5/10 Pain location: R hip Pain description: sore Aggravating factors: walking Relieving factors: rest  PRECAUTIONS: ICD/Pacemaker and Other: POTS, advised to stay out of heat, has LE lymphedema  RED FLAGS: None   WEIGHT BEARING RESTRICTIONS: No  FALLS:  Has patient fallen in last 6 months? No  LIVING ENVIRONMENT: Lives with: lives with their spouse Lives in: House/apartment Stairs: Yes: Internal: 13 steps; on right going up, on  left going up, and can reach both Has following equipment at home: Single point cane, Walker - 2 wheeled, and shower chair  OCCUPATION: medically disabled due to cancer.   PLOF: Independent walking 2-3 miles, 3-4x/week  PATIENT GOALS: be able to get up from floor, work on balance, decrease L hip pain.     OBJECTIVE:   DIAGNOSTIC FINDINGS: 10/08/2022 XR Bilateral Hips An AP pelvis and lateral both hips shows a well-seated right total hip  arthroplasty.  There is no evidence of AVN with the left hip.   PATIENT SURVEYS:  LEFS 46/80  COGNITION: Overall cognitive status: Within functional limits for tasks assessed     SENSATION: Neuropathy bil feet  EDEMA:  History bil LE edema- being treated at lymphedema clinic  MUSCLE LENGTH: Hamstrings: moderate tightness bilaterally  POSTURE:  weight shift left and leg length discrepancy with R LE longer than L  PALPATION: Tenderness over L glutes, L piriforomis, L greater trochanter, along L Itband.   LOWER EXTREMITY ROM:  Active ROM Right eval Left eval  Hip flexion 140 140  Hip extension    Hip internal rotation 25 25  Hip external rotation 60 60   (Blank rows = not tested)  LOWER EXTREMITY MMT:  MMT Right eval  Left eval Right  seated Left seated  Hip flexion 5 5 4+ 4  Hip extension 5 4    Hip abduction 5  5  4+ 4+ pain  Hip adduction 5 (seated) 5 (seated) 5 5  Hip internal rotation      Hip external rotation      Knee flexion 5 5    Knee extension 5 5    Ankle dorsiflexion 5 5    Ankle plantarflexion 5 5     (Blank rows = not tested)  LOWER EXTREMITY SPECIAL TESTS:  Hip special tests: Ober's test: positive for pain  FUNCTIONAL TESTS:  5 times sit to stand: 14.5 sec  DGI - 24/24  GAIT: Distance walked: 50 Assistive device utilized: None Level of assistance: Complete Independence Comments: visually slow gait speed.    TODAY'S TREATMENT:                                                                                                                              DATE:   12/24/22 Therapeutic Activity:  to assess progress towards goals.  Bike L2 x 7 min with subjective MMT LEFS Discussion of different activities could participate in - recommended trying pilates or chair yoga Manual Therapy: to decrease muscle spasm and pain and improve mobility STM/TPR to bil glutes, TFL Trigger Point Dry-Needling  Treatment instructions: Expect mild to moderate muscle soreness. S/S of pneumothorax if dry needled over a lung field, and to seek immediate medical attention should they occur. Patient verbalized understanding of these instructions and education. Patient Consent Given: Yes Education handout provided: Previously provided Muscles treated: bil glut max, TFL Electrical stimulation performed: No Parameters: N/A Treatment  response/outcome: Twitch Response Elicited and Palpable Increase in Muscle Length   12/17/22 Therapeutic Exercise: to improve strength and mobility.  Demo, verbal and tactile cues throughout for technique. Bike L1 x 6 min  Leg Press 20# 3 x 10 Rows 15# 3 x 10  Leg extension 15# 2 x 10  Hamstring curls 15# 2 x 10  Levator stretches 2 x 15 sec    12/13/22  Therapeutic Exercise: to improve strength and mobility.  Demo, verbal and tactile cues throughout for technique. Nustep L5 x 5 min  Standing hip openers 2 x 8 bil  Standing lunges - foot on towel  x 10 forward, lateral and back, bil  Supine bridge x 10, bridge with feet on bosu 2 x 10  Manual Therapy: to decrease muscle spasm and pain and improve mobility IASTM to R hip flexors, scar tissue mobilization, Itband to decrease pain and spasm.    12/10/22 Therapeutic Activity:  assessing progress towards goals and concerns Nustep L5 x 5 min for warm-up and subjective 5xSTS Concerns with R and L hip and activity modification Manual Therapy: to decrease muscle spasm and pain and improve mobility Manual edema resorption to R hip around TFL, scar tissue massage, STM/TPR to R hamstrings and quads around [place]    PATIENT EDUCATION:  Education details: HEP review Person educated: Patient Education method: Medical illustrator Education comprehension: verbalized understanding and returned demonstration  HOME EXERCISE PROGRAM: Access Code: [email or web address] URL: [email or web address] Date: 11/22/2022 Prepared by: Harrie Foreman  Exercises - Supine ITB Stretch  - 1 x daily - 7 x weekly - 1 sets - 3 reps - 30 sec  hold - Supine Hip Internal and External Rotation  - 1 x daily - 7 x weekly - 2 sets - 10 reps - Supine Bridge with Mini Swiss Ball Between Knees  - 1 x daily - 7 x weekly - 2 sets - 10 reps - Prone Hip Extension with Bent Knee  - 1 x daily - 7 x weekly - 2 sets - 10 reps - ITB Stretch at Wall  - 1 x daily - 7 x weekly - 1 sets - 3 reps - 30 sec hold - Standing Hip Flexor Stretch  - 1 x daily - 7 x weekly - 1 sets - 3 reps - 30 sec hold - Squat with Chair Touch  - 3 x daily - 7 x weekly - 2 sets - 5 reps - Lunge Matrix on Slider  - 1 x daily - 7 x weekly - 2-3 sets - 10 reps - Single Leg Balance with Clock Reach  - 1 x daily - 7 x weekly - 3 sets - 5  reps   ASSESSMENT:  CLINICAL IMPRESSION: Kelcee reports increased hip pain bilaterally today.  She also demonstrated increased R hip flexor weakness.  She has overall improved significantly with PT, meeting most of her goals, and LEFS has improved from 46/80 to 57/80, which is significant.  We did discuss exploring other activities like yoga or pilates to continue strengthening, and alternating these with walking to give her hips a break, since while she enjoys walking, too much walking does seem increase her pain with her LLD.  Manual therapy at end of session with TrDN to decrease pain.  Roxann Massaro Heikes continues to demonstrate potential for improvement and would benefit from continued skilled therapy to address impairments.    .     OBJECTIVE IMPAIRMENTS: Abnormal gait, decreased activity tolerance, decreased balance, decreased endurance,  decreased mobility, difficulty walking, decreased strength, impaired flexibility, and pain.   ACTIVITY LIMITATIONS: carrying, lifting, bending, standing, squatting, sleeping, and locomotion level  PARTICIPATION LIMITATIONS: meal prep, cleaning, laundry, and shopping  PERSONAL FACTORS: 3+ comorbidities: anxiety, asthma, DM-II, HTN, CVA, DVT, POTS, pacemaker, malignant melanoma with brain, liver and lung metastasis s/p radiation and L parietal craniotomy, osteopenia, diverticulitis, GERD, and Raynaud's disease. R THA 05/19/22  are also affecting patient's functional outcome.   REHAB POTENTIAL: Good  CLINICAL DECISION MAKING: Evolving/moderate complexity  EVALUATION COMPLEXITY: Moderate   GOALS: Goals reviewed with patient? Yes  SHORT TERM GOALS: Target date: 11/07/2022   Patient will be independent with initial HEP. Baseline: given 11/01/22 Goal status: MET  11/15/22  2.  Patient will undergo further balance testing Baseline:  Goal status: MET  11/01/22 - competed DGI  LONG TERM GOALS: Target date: 12/19/2022 extended to 01/07/2023  Patient will be  independent with advanced/ongoing HEP to improve outcomes and carryover.  Baseline:  Goal status: IN PROGRESS 12/10/22- met for current  2.  Patient will report at least 75% improvement in Left hip pain to improve QOL. Baseline:  Goal status: IN PROGRESS 12/10/22- 100% improvement 12/24/22- pain is starting t oreturn  3.  Patient will demonstrate improved functional LE strength as demonstrated by 5x STS <14 seconds to decrease fall risk. Baseline: NT Goal status: MET 12/10/22 13.5 seconds  4.  Patient will report at least 9 points improvement on LEFS to demonstrate improved functional ability. Baseline: 46/80 Goal status: MET 0/28/24 57/80  5.  Patient will demonstrate at least 19/24 on DGI to decrease risk of falls. Baseline: NT Goal status: MET  11/01/22 - 24/24  6.  Patient will be able to perform floor to stand transfer without use of furniture to assist safely.  Baseline: gets "stuck" when squatting down to pick up item, has to crawl to get back up.  Goal status: IN PROGRESS  11/19/22- able to perform with supervision today.  12/24/22 - can squat down, still challenged with floor to stand transfers.    PLAN:  PT FREQUENCY: 1-2x/week  PT DURATION: 8 weeks  PLANNED INTERVENTIONS: Therapeutic exercises, Therapeutic activity, Neuromuscular re-education, Balance training, Gait training, Patient/Family education, Self Care, Joint mobilization, Stair training, Orthotic/Fit training, Dry Needling, Spinal mobilization, Moist heat, Taping, Traction, Manual therapy, and Re-evaluation  PLAN FOR NEXT SESSION: practice floor transfers per pt request, continue manual and TrDN, review and progress HEP for LE strengthening and balance.    Jena Gauss, PT 12/24/2022, 5:20 PM

## 2022-12-27 ENCOUNTER — Ambulatory Visit: Payer: Medicare Other | Admitting: Physical Therapy

## 2022-12-31 ENCOUNTER — Ambulatory Visit: Payer: Medicare Other | Attending: Orthopaedic Surgery | Admitting: Physical Therapy

## 2022-12-31 DIAGNOSIS — R2681 Unsteadiness on feet: Secondary | ICD-10-CM | POA: Insufficient documentation

## 2022-12-31 DIAGNOSIS — M25551 Pain in right hip: Secondary | ICD-10-CM | POA: Insufficient documentation

## 2022-12-31 DIAGNOSIS — M25552 Pain in left hip: Secondary | ICD-10-CM | POA: Insufficient documentation

## 2022-12-31 DIAGNOSIS — M6281 Muscle weakness (generalized): Secondary | ICD-10-CM | POA: Insufficient documentation

## 2023-01-03 ENCOUNTER — Ambulatory Visit: Payer: Medicare Other | Admitting: Physical Therapy

## 2023-01-07 ENCOUNTER — Ambulatory Visit: Payer: Medicare Other | Admitting: Physical Therapy

## 2023-01-07 ENCOUNTER — Encounter: Payer: Self-pay | Admitting: Physical Therapy

## 2023-01-07 ENCOUNTER — Telehealth: Payer: Self-pay | Admitting: Orthopaedic Surgery

## 2023-01-07 DIAGNOSIS — R2681 Unsteadiness on feet: Secondary | ICD-10-CM

## 2023-01-07 DIAGNOSIS — M25551 Pain in right hip: Secondary | ICD-10-CM | POA: Diagnosis not present

## 2023-01-07 DIAGNOSIS — M6281 Muscle weakness (generalized): Secondary | ICD-10-CM

## 2023-01-07 DIAGNOSIS — M25552 Pain in left hip: Secondary | ICD-10-CM

## 2023-01-07 NOTE — Therapy (Signed)
OUTPATIENT PHYSICAL THERAPY TREATMENT    Patient Name: [name] MRN: [id number] DOB:05-18-1955, 67 y.o., female Today's Date: 01/07/2023  END OF SESSION:  PT End of Session - 01/07/23 1404     Visit Number 14    Date for PT Re-Evaluation 02/18/23    Authorization Type [employer]    Authorization Time Period 12/26/22- 01/09/23    Authorization - Visit Number 1    Authorization - Number of Visits 4    PT Start Time 1403    PT Stop Time 1445    PT Time Calculation (min) 42 min    Activity Tolerance Patient tolerated treatment well    Behavior During Therapy WFL for tasks assessed/performed              Past Medical History:  Diagnosis Date   Anemia    Angio-edema    Anxiety    Arthritis    Asthma    Atrial fibrillation (HCC)    Atrial tachycardia (HCC)    Autonomic dysfunction    Complication of anesthesia    pt states wakes up with "shakes"   CVA (cerebral infarction)    2012 with dizziness and vision change felt embolic from atrial tachy   Disorder of bone and cartilage, unspecified    Diverticulosis    DM (diabetes mellitus) (HCC)    DVT (deep venous thrombosis) (HCC)    Eczema    Family history of anesthesia complication    PONV and " shaking "   Fatty liver    GERD (gastroesophageal reflux disease)    History of hiatal hernia    History of radiation therapy 10/24/2012   brain   HLD (hyperlipidemia)    HTN (hypertension)    Hypoglycemia, unspecified    Liver metastasis    Lung metastasis    Metastasis to brain (HCC)    Osteopenia    Other nonspecific abnormal serum enzyme levels    Other specified congenital anomalies of nervous system    Pacemaker    autonomic dysfunction   Pancreatitis    from therapy   Peripheral vascular disease (HCC)    POTS (postural orthostatic tachycardia syndrome)    Raynaud's disease    due to chemo   Skin cancer    Hx: of lung lesion   Past Surgical History:  Procedure Laterality Date   ABLATION  SAPHENOUS VEIN W/ RFA     2002 x3   CARPAL TUNNEL RELEASE Left 2000   CHOLECYSTECTOMY N/A 03/28/2018   Procedure: LAPAROSCOPIC CHOLECYSTECTOMY WITH INTRAOPERATIVE CHOLANGIOGRAM;  Surgeon: Luretha Murphy, MD;  Location: [facility] OR;  Service: General;  Laterality: N/A;   CRANIOTOMY N/A 09/30/2012   suboccipital craniectomy   CRANIOTOMY Left 02/09/2013   Procedure: LEFT PARIETAL CRANIOTOMY with stealth;  Surgeon: Barnett Abu, MD;  Location: [facility];  Service: Neurosurgery;  Laterality: Left;  LEFT Parietal Craniotomy for tumor with stealth   DEEP AXILLARY SENTINEL NODE BIOPSY / EXCISION     due to extensive Melanoma-right arm   fatty tumor removed  2000   from chest   INSERT / REPLACE / REMOVE PACEMAKER  2012   1999, x 3   LAPAROSCOPY  09/06/2011   Procedure: LAPAROSCOPY OPERATIVE;  Surgeon: Tresa Endo A. Ernestina Penna, MD;  Location: [facility]S;  Service: Gynecology;  Laterality: Left;  with Left Ovarian Cystectomy    MELANOMA EXCISION     with removal of lymph nodes, left shoulder   NOSE SURGERY  2010, 2012   for nose  bleeds x 2   SINOSCOPY     TONSILLECTOMY AND ADENOIDECTOMY     TOTAL HIP ARTHROPLASTY Right 05/18/2022   Procedure: RIGHT TOTAL HIP ARTHROPLASTY ANTERIOR APPROACH;  Surgeon: Kathryne Hitch, MD;  Location: [facility] ORS;  Service: Orthopedics;  Laterality: Right;   Patient Active Problem List   Diagnosis Date Noted   Status post total replacement of right hip 05/18/2022   Avascular necrosis of bone of right hip (HCC) 04/19/2022   Moderate persistent asthma 12/08/2018   Perennial allergic rhinitis with predominantly nonallergic component 12/08/2018   Allergic conjunctivitis 12/08/2018   History of epistaxis 12/08/2018   Atopic dermatitis 12/08/2018   Recurrent urticaria 12/08/2018   Skeeter syndrome 12/08/2018   Skin lesion of chest wall 07/22/2018   S/P laparoscopic cholecystectomy 03/28/2018   Dehydration 05/18/2016   Type 2 diabetes mellitus without complication, without  long-term current use of insulin (HCC) 11/01/2015   History of pacemaker    POTS (postural orthostatic tachycardia syndrome)    Iron deficiency anemia 11/11/2014   Chronic colitis 08/28/2013   Malignant melanoma, metastatic (HCC) 02/09/2013   Malignant melanoma (HCC) 12/01/2012   Malignant neoplasm metastatic to brain (HCC) 10/16/2012   Hypertension 09/30/2012   Anxiety state 09/30/2012   Hypoxemia 09/30/2012   Disorder of brain 09/08/2012   Dysautonomia (HCC) 02/25/2011   Anticoagulant long-term use 01/19/2011   Neuritis 01/19/2011   Skin change 01/19/2011   Cerebral infarction (HCC)    Palpitation 06/28/2010   Pacemaker-Biotronik 06/28/2010   Dizziness and giddiness 04/27/2010   DIVERTICULOSIS, COLON, HX OF 01/19/2009   CAROTID SINUS SYNDROME 11/17/2008   OTHER MALAISE AND FATIGUE 11/17/2008   HYPERTENSION, BENIGN 11/02/2008   ATRIAL TACHYCARDIA 09/16/2008   SYNCOPE, HX OF 08/09/2008   INJURY UNSPEC NERVE SHOULDER GIRDLE&UPPER LIMB 07/12/2008   PERSONAL HISTORY OF MALIGNANT MELANOMA OF SKIN 07/12/2008   LUQ PAIN 12/08/2007   NECK PAIN 09/26/2007   NUMBNESS, ARM 09/26/2007   CPK, ABNORMAL 09/26/2007   ARM PAIN, LEFT 06/25/2007   SWELLING OF LIMB 06/25/2007   OSTEOPENIA 06/25/2007    PCP: Madelin Headings, MD   REFERRING PROVIDER: Kathryne Hitch, MD  REFERRING DIAG: [phone] (ICD-10-CM) - It band syndrome, left  THERAPY DIAG:  Pain in left hip  Muscle weakness (generalized)  Unsteadiness on feet  Pain in right hip  Rationale for Evaluation and Treatment: Rehabilitation  ONSET DATE:  2-3 months (prior to IE)  NEXT MD VISIT: 02/13/23   SUBJECTIVE:   SUBJECTIVE STATEMENT: Broke out in a rash all over, pulling in R hip today around scar, really hurting.    Hasn't been to doctor about the rash.    PERTINENT HISTORY:  anxiety, asthma, DM-II, HTN, CVA, DVT, POTS, pacemaker, malignant melanoma with brain, liver and lung metastasis s/p radiation and L  parietal craniotomy, osteopenia, diverticulitis, GERD, and Raynaud's disease. R THA 05/19/22 PAIN:  Are you having pain? Yes: NPRS scale: 8/10 Pain location: R hip Pain description: sore Aggravating factors: walking Relieving factors: rest  PRECAUTIONS: ICD/Pacemaker and Other: POTS, advised to stay out of heat, has LE lymphedema  RED FLAGS: None   WEIGHT BEARING RESTRICTIONS: No  FALLS:  Has patient fallen in last 6 months? No  LIVING ENVIRONMENT: Lives with: lives with their spouse Lives in: House/apartment Stairs: Yes: Internal: 13 steps; on right going up, on left going up, and can reach both Has following equipment at home: Single point cane, Walker - 2 wheeled, and shower chair  OCCUPATION: medically disabled due  to cancer.   PLOF: Independent walking 2-3 miles, 3-4x/week  PATIENT GOALS: be able to get up from floor, work on balance, decrease L hip pain.     OBJECTIVE:   DIAGNOSTIC FINDINGS: 10/08/2022 XR Bilateral Hips An AP pelvis and lateral both hips shows a well-seated right total hip  arthroplasty.  There is no evidence of AVN with the left hip.   PATIENT SURVEYS:  LEFS 46/80  COGNITION: Overall cognitive status: Within functional limits for tasks assessed     SENSATION: Neuropathy bil feet  EDEMA:  History bil LE edema- being treated at lymphedema clinic  MUSCLE LENGTH: Hamstrings: moderate tightness bilaterally  POSTURE:  weight shift left and leg length discrepancy with R LE longer than L  PALPATION: Tenderness over L glutes, L piriforomis, L greater trochanter, along L Itband.   LOWER EXTREMITY ROM:  Active ROM Right eval Left eval  Hip flexion 140 140  Hip extension    Hip internal rotation 25 25  Hip external rotation 60 60   (Blank rows = not tested)  LOWER EXTREMITY MMT:  MMT Right eval Left eval Right  01/07/23 Left 01/07/23  Hip flexion 5 5 4  pain 4  Hip extension 5 4    Hip abduction 5  5  4+ 4+ pain  Hip adduction 5  (seated) 5 (seated) 5 5  Hip internal rotation      Hip external rotation      Knee flexion 5 5    Knee extension 5 5    Ankle dorsiflexion 5 5    Ankle plantarflexion 5 5     (Blank rows = not tested)  LOWER EXTREMITY SPECIAL TESTS:  Hip special tests: Ober's test: positive for pain  FUNCTIONAL TESTS:  5 times sit to stand: 14.5 sec  DGI - 24/24  GAIT: Distance walked: 50 Assistive device utilized: None Level of assistance: Complete Independence Comments: visually slow gait speed.    TODAY'S TREATMENT:                                                                                                                              DATE:   01/07/23 Therapeutic Exercise: to improve strength and mobility.  Demo, verbal and tactile cues throughout for technique. Nustep L5 x 7 min Squats x 10  Self Care: Discussion of concerns, recommendations of OTC allergy medications, return to MD, new inserts for shoes.    12/24/22 Therapeutic Activity:  to assess progress towards goals.  Bike L2 x 7 min with subjective MMT LEFS Discussion of different activities could participate in - recommended trying pilates or chair yoga Manual Therapy: to decrease muscle spasm and pain and improve mobility STM/TPR to bil glutes, TFL Trigger Point Dry-Needling  Treatment instructions: Expect mild to moderate muscle soreness. S/S of pneumothorax if dry needled over a lung field, and to seek immediate medical attention should they occur. Patient verbalized understanding of these instructions and education. Patient Consent  Given: Yes Education handout provided: Previously provided Muscles treated: bil glut max, TFL Electrical stimulation performed: No Parameters: N/A Treatment response/outcome: Twitch Response Elicited and Palpable Increase in Muscle Length   12/17/22 Therapeutic Exercise: to improve strength and mobility.  Demo, verbal and tactile cues throughout for technique. Bike L1 x 6 min  Leg  Press 20# 3 x 10 Rows 15# 3 x 10  Leg extension 15# 2 x 10  Hamstring curls 15# 2 x 10  Levator stretches 2 x 15 sec    12/13/22 Therapeutic Exercise: to improve strength and mobility.  Demo, verbal and tactile cues throughout for technique. Nustep L5 x 5 min  Standing hip openers 2 x 8 bil  Standing lunges - foot on towel  x 10 forward, lateral and back, bil  Supine bridge x 10, bridge with feet on bosu 2 x 10  Manual Therapy: to decrease muscle spasm and pain and improve mobility IASTM to R hip flexors, scar tissue mobilization, Itband to decrease pain and spasm.   PATIENT EDUCATION:  Education details: HEP review Person educated: Patient Education method: Medical illustrator Education comprehension: verbalized understanding and returned demonstration  HOME EXERCISE PROGRAM: Access Code: [email or web address] URL: [email or web address] Date: 11/22/2022 Prepared by: Harrie Foreman  Exercises - Supine ITB Stretch  - 1 x daily - 7 x weekly - 1 sets - 3 reps - 30 sec  hold - Supine Hip Internal and External Rotation  - 1 x daily - 7 x weekly - 2 sets - 10 reps - Supine Bridge with Mini Swiss Ball Between Knees  - 1 x daily - 7 x weekly - 2 sets - 10 reps - Prone Hip Extension with Bent Knee  - 1 x daily - 7 x weekly - 2 sets - 10 reps - ITB Stretch at Wall  - 1 x daily - 7 x weekly - 1 sets - 3 reps - 30 sec hold - Standing Hip Flexor Stretch  - 1 x daily - 7 x weekly - 1 sets - 3 reps - 30 sec hold - Squat with Chair Touch  - 3 x daily - 7 x weekly - 2 sets - 5 reps - Lunge Matrix on Slider  - 1 x daily - 7 x weekly - 2-3 sets - 10 reps - Single Leg Balance with Clock Reach  - 1 x daily - 7 x weekly - 3 sets - 5 reps   ASSESSMENT:  CLINICAL IMPRESSION: Ashtynn reports missing last 3 visits due to confusion over insurance authorization.  She continues to have hip pain, R >L, aggravated today by forgetting to wear heel lift.  Issued new heel lift today and  recommended purchasing extras so wasn't switching them between shoes.  She also had rash over body, taking benadryl which makes her sleepy, recommended trying some of the new classes of antihistamines that are less sedating to decrease fall risk, can talk to pharmacist as well for more guidance.  Will extend her POC due to missed visits, she also plans to reschedule visit with Dr. Magnus Ivan about hip pain.  Noted weakness in R hip flexors was worse today due to pain.   Rexann Marchesani Hilmer continues to demonstrate potential for improvement and would benefit from continued skilled therapy to address impairments.    .     OBJECTIVE IMPAIRMENTS: Abnormal gait, decreased activity tolerance, decreased balance, decreased endurance, decreased mobility, difficulty walking, decreased strength, impaired flexibility, and pain.   ACTIVITY LIMITATIONS: carrying, lifting, bending,  standing, squatting, sleeping, and locomotion level  PARTICIPATION LIMITATIONS: meal prep, cleaning, laundry, and shopping  PERSONAL FACTORS: 3+ comorbidities: anxiety, asthma, DM-II, HTN, CVA, DVT, POTS, pacemaker, malignant melanoma with brain, liver and lung metastasis s/p radiation and L parietal craniotomy, osteopenia, diverticulitis, GERD, and Raynaud's disease. R THA 05/19/22  are also affecting patient's functional outcome.   REHAB POTENTIAL: Good  CLINICAL DECISION MAKING: Evolving/moderate complexity  EVALUATION COMPLEXITY: Moderate   GOALS: Goals reviewed with patient? Yes  SHORT TERM GOALS: Target date: 11/07/2022   Patient will be independent with initial HEP. Baseline: given 11/01/22 Goal status: MET  11/15/22  2.  Patient will undergo further balance testing Baseline:  Goal status: MET  11/01/22 - competed DGI  LONG TERM GOALS: Target date: 01/07/23  extended to 02/18/23  Patient will be independent with advanced/ongoing HEP to improve outcomes and carryover.  Baseline:  Goal status: IN PROGRESS 12/10/22- met for  current  2.  Patient will report at least 75% improvement in Left hip pain to improve QOL. Baseline:  Goal status: IN PROGRESS 12/10/22- 100% improvement 12/24/22- pain is starting to return  01/07/23- L hip better this week, 4/10, R hip hurting more 8/10.   3.  Patient will demonstrate improved functional LE strength as demonstrated by 5x STS <14 seconds to decrease fall risk. Baseline: NT Goal status: MET 12/10/22 13.5 seconds  4.  Patient will report at least 9 points improvement on LEFS to demonstrate improved functional ability. Baseline: 46/80 Goal status: MET 0/28/24 57/80  5.  Patient will demonstrate at least 19/24 on DGI to decrease risk of falls. Baseline: NT Goal status: MET  11/01/22 - 24/24  6.  Patient will be able to perform floor to stand transfer without use of furniture to assist safely.  Baseline: gets "stuck" when squatting down to pick up item, has to crawl to get back up.  Goal status: IN PROGRESS  11/19/22- able to perform with supervision today.  12/24/22 - can squat down, still challenged with floor to stand transfers.  01/07/23- still difficult   PLAN:  PT FREQUENCY: 1-2x/week  PT DURATION: 6 weeks  PLANNED INTERVENTIONS: Therapeutic exercises, Therapeutic activity, Neuromuscular re-education, Balance training, Gait training, Patient/Family education, Self Care, Joint mobilization, Stair training, Orthotic/Fit training, Dry Needling, Spinal mobilization, Moist heat, Taping, Traction, Manual therapy, and Re-evaluation  PLAN FOR NEXT SESSION: practice floor transfers per pt request, continue manual and TrDN, review and progress HEP for LE strengthening and balance.    Jena Gauss, PT 01/07/2023, 5:33 PM   Date of referral: 10/09/2022 Referring provider: Kathryne Hitch* Referring diagnosis? M76.32 (ICD-10-CM) - It band syndrome, left Treatment diagnosis? (if different than referring diagnosis) Pain in left hip  Muscle weakness  (generalized)  Unsteadiness on feet  Pain in right hip  What was this (referring dx) caused by? Ongoing Issue  Ashby Dawes of Condition: Chronic (continuous duration > 3 months)   Laterality: Both  Current Functional Measure Score: LEFS 57/80  Objective measurements identify impairments when they are compared to normal values, the uninvolved extremity, and prior level of function.  [x]  Yes  []  No  Objective assessment of functional ability: Minimal functional limitations   Briefly describe symptoms: bil hip pain, right worse than left, increase with walking, limiting exercise  How did symptoms start: May 2023  Average pain intensity:  Last 24 hours: 8/10 R hip, 4/10 L hip  Past week: 8/10 R hip, 4/10 L hip  How often does the pt experience symptoms?  Frequently  How much have the symptoms interfered with usual daily activities? Quite a bit  How has condition changed since care began at this facility? A little better  In general, how is the patients overall health? Fair   BACK PAIN (STarT Back Screening Tool) No

## 2023-01-07 NOTE — Telephone Encounter (Signed)
Pt called stating need more approval for physical therapy. Pt has appt but need approval. Pt asking for call back at [phone]. Pt is asking for physical therapy for bil legs

## 2023-01-08 ENCOUNTER — Other Ambulatory Visit: Payer: Self-pay

## 2023-01-08 DIAGNOSIS — Z96641 Presence of right artificial hip joint: Secondary | ICD-10-CM

## 2023-01-08 DIAGNOSIS — M7632 Iliotibial band syndrome, left leg: Secondary | ICD-10-CM

## 2023-01-08 DIAGNOSIS — M2170 Unequal limb length (acquired), unspecified site: Secondary | ICD-10-CM

## 2023-01-08 DIAGNOSIS — M7062 Trochanteric bursitis, left hip: Secondary | ICD-10-CM

## 2023-01-08 NOTE — Telephone Encounter (Signed)
Done

## 2023-01-15 DIAGNOSIS — I890 Lymphedema, not elsewhere classified: Secondary | ICD-10-CM | POA: Diagnosis not present

## 2023-01-15 DIAGNOSIS — C787 Secondary malignant neoplasm of liver and intrahepatic bile duct: Secondary | ICD-10-CM | POA: Diagnosis not present

## 2023-01-15 DIAGNOSIS — Z96641 Presence of right artificial hip joint: Secondary | ICD-10-CM | POA: Diagnosis not present

## 2023-01-15 DIAGNOSIS — E119 Type 2 diabetes mellitus without complications: Secondary | ICD-10-CM | POA: Diagnosis not present

## 2023-01-15 DIAGNOSIS — Z85820 Personal history of malignant melanoma of skin: Secondary | ICD-10-CM | POA: Diagnosis not present

## 2023-01-15 DIAGNOSIS — C4359 Malignant melanoma of other part of trunk: Secondary | ICD-10-CM | POA: Diagnosis not present

## 2023-01-15 DIAGNOSIS — C7931 Secondary malignant neoplasm of brain: Secondary | ICD-10-CM | POA: Diagnosis not present

## 2023-01-15 DIAGNOSIS — Z800 Family history of malignant neoplasm of digestive organs: Secondary | ICD-10-CM | POA: Diagnosis not present

## 2023-01-15 DIAGNOSIS — I10 Essential (primary) hypertension: Secondary | ICD-10-CM | POA: Diagnosis not present

## 2023-01-15 DIAGNOSIS — C7800 Secondary malignant neoplasm of unspecified lung: Secondary | ICD-10-CM | POA: Diagnosis not present

## 2023-01-15 DIAGNOSIS — Z79899 Other long term (current) drug therapy: Secondary | ICD-10-CM | POA: Diagnosis not present

## 2023-01-22 ENCOUNTER — Ambulatory Visit: Payer: Medicare Other | Admitting: Physical Therapy

## 2023-01-22 DIAGNOSIS — L309 Dermatitis, unspecified: Secondary | ICD-10-CM | POA: Diagnosis not present

## 2023-01-27 ENCOUNTER — Other Ambulatory Visit: Payer: Self-pay | Admitting: Internal Medicine

## 2023-01-28 ENCOUNTER — Other Ambulatory Visit: Payer: Self-pay | Admitting: Internal Medicine

## 2023-01-28 ENCOUNTER — Encounter: Payer: Self-pay | Admitting: Physical Therapy

## 2023-01-28 ENCOUNTER — Telehealth: Payer: Self-pay

## 2023-01-28 ENCOUNTER — Ambulatory Visit: Payer: Medicare Other | Attending: Orthopaedic Surgery | Admitting: Physical Therapy

## 2023-01-28 DIAGNOSIS — R2681 Unsteadiness on feet: Secondary | ICD-10-CM | POA: Diagnosis not present

## 2023-01-28 DIAGNOSIS — M25552 Pain in left hip: Secondary | ICD-10-CM | POA: Insufficient documentation

## 2023-01-28 DIAGNOSIS — M7062 Trochanteric bursitis, left hip: Secondary | ICD-10-CM | POA: Diagnosis not present

## 2023-01-28 DIAGNOSIS — Z96641 Presence of right artificial hip joint: Secondary | ICD-10-CM | POA: Insufficient documentation

## 2023-01-28 DIAGNOSIS — M25551 Pain in right hip: Secondary | ICD-10-CM | POA: Diagnosis not present

## 2023-01-28 DIAGNOSIS — M7632 Iliotibial band syndrome, left leg: Secondary | ICD-10-CM | POA: Diagnosis not present

## 2023-01-28 DIAGNOSIS — M6281 Muscle weakness (generalized): Secondary | ICD-10-CM | POA: Insufficient documentation

## 2023-01-28 DIAGNOSIS — M2170 Unequal limb length (acquired), unspecified site: Secondary | ICD-10-CM | POA: Diagnosis not present

## 2023-01-28 MED ORDER — CLONAZEPAM 1 MG PO TABS: ORAL_TABLET | ORAL | 0 refills | Status: DC

## 2023-01-28 NOTE — Therapy (Signed)
OUTPATIENT PHYSICAL THERAPY TREATMENT   Progress Note  Reporting Period 01/07/2023 to 01/28/2023   See note below for Objective Data and Assessment of Progress/Goals.     Patient Name: [name] MRN: [id number] DOB:January 18, 1956, 67 y.o., female Today's Date: 01/28/2023  END OF SESSION:  PT End of Session - 01/28/23 1401     Visit Number 15    Date for PT Re-Evaluation 02/18/23    Authorization Type [employer] Medicare    Authorization Time Period 01/07/23 - 02/04/23    Authorization - Visit Number 1    Authorization - Number of Visits 4    PT Start Time 1401    PT Stop Time 1450    PT Time Calculation (min) 49 min    Activity Tolerance Patient tolerated treatment well    Behavior During Therapy WFL for tasks assessed/performed               Past Medical History:  Diagnosis Date   Anemia    Angio-edema    Anxiety    Arthritis    Asthma    Atrial fibrillation (HCC)    Atrial tachycardia (HCC)    Autonomic dysfunction    Complication of anesthesia    pt states wakes up with "shakes"   CVA (cerebral infarction)    2012 with dizziness and vision change felt embolic from atrial tachy   Disorder of bone and cartilage, unspecified    Diverticulosis    DM (diabetes mellitus) (HCC)    DVT (deep venous thrombosis) (HCC)    Eczema    Family history of anesthesia complication    PONV and " shaking "   Fatty liver    GERD (gastroesophageal reflux disease)    History of hiatal hernia    History of radiation therapy 10/24/2012   brain   HLD (hyperlipidemia)    HTN (hypertension)    Hypoglycemia, unspecified    Liver metastasis    Lung metastasis    Metastasis to brain (HCC)    Osteopenia    Other nonspecific abnormal serum enzyme levels    Other specified congenital anomalies of nervous system    Pacemaker    autonomic dysfunction   Pancreatitis    from therapy   Peripheral vascular disease (HCC)    POTS (postural orthostatic tachycardia syndrome)    Raynaud's  disease    due to chemo   Skin cancer    Hx: of lung lesion   Past Surgical History:  Procedure Laterality Date   ABLATION SAPHENOUS VEIN W/ RFA     2002 x3   CARPAL TUNNEL RELEASE Left 2000   CHOLECYSTECTOMY N/A 03/28/2018   Procedure: LAPAROSCOPIC CHOLECYSTECTOMY WITH INTRAOPERATIVE CHOLANGIOGRAM;  Surgeon: Luretha Murphy, MD;  Location: [facility] OR;  Service: General;  Laterality: N/A;   CRANIOTOMY N/A 09/30/2012   suboccipital craniectomy   CRANIOTOMY Left 02/09/2013   Procedure: LEFT PARIETAL CRANIOTOMY with stealth;  Surgeon: Barnett Abu, MD;  Location: [facility];  Service: Neurosurgery;  Laterality: Left;  LEFT Parietal Craniotomy for tumor with stealth   DEEP AXILLARY SENTINEL NODE BIOPSY / EXCISION     due to extensive Melanoma-right arm   fatty tumor removed  2000   from chest   INSERT / REPLACE / REMOVE PACEMAKER  2012   1999, x 3   LAPAROSCOPY  09/06/2011   Procedure: LAPAROSCOPY OPERATIVE;  Surgeon: Tresa Endo A. Ernestina Penna, MD;  Location: [facility];  Service: Gynecology;  Laterality: Left;  with Left Ovarian Cystectomy  MELANOMA EXCISION     with removal of lymph nodes, left shoulder   NOSE SURGERY  2010, 2012   for nose bleeds x 2   SINOSCOPY     TONSILLECTOMY AND ADENOIDECTOMY     TOTAL HIP ARTHROPLASTY Right 05/18/2022   Procedure: RIGHT TOTAL HIP ARTHROPLASTY ANTERIOR APPROACH;  Surgeon: Kathryne Hitch, MD;  Location: [facility] OR[facility];  Service: Orthopedics;  Laterality: Right;   Patient Active Problem List   Diagnosis Date Noted   Status post total replacement of right hip 05/18/2022   Avascular necrosis of bone of right hip (HCC) 04/19/2022   Moderate persistent asthma 12/08/2018   Perennial allergic rhinitis with predominantly nonallergic component 12/08/2018   Allergic conjunctivitis 12/08/2018   History of epistaxis 12/08/2018   Atopic dermatitis 12/08/2018   Recurrent urticaria 12/08/2018   Skeeter syndrome 12/08/2018   Skin lesion of chest wall 07/22/2018    S/P laparoscopic cholecystectomy 03/28/2018   Dehydration 05/18/2016   Type 2 diabetes mellitus without complication, without long-term current use of insulin (HCC) 11/01/2015   History of pacemaker    POTS (postural orthostatic tachycardia syndrome)    Iron deficiency anemia 11/11/2014   Chronic colitis 08/28/2013   Malignant melanoma, metastatic (HCC) 02/09/2013   Malignant melanoma (HCC) 12/01/2012   Malignant neoplasm metastatic to brain (HCC) 10/16/2012   Hypertension 09/30/2012   Anxiety state 09/30/2012   Hypoxemia 09/30/2012   Disorder of brain 09/08/2012   Dysautonomia (HCC) 02/25/2011   Anticoagulant long-term use 01/19/2011   Neuritis 01/19/2011   Skin change 01/19/2011   Cerebral infarction (HCC)    Palpitation 06/28/2010   Pacemaker-Biotronik 06/28/2010   Dizziness and giddiness 04/27/2010   DIVERTICULOSIS, COLON, HX OF 01/19/2009   CAROTID SINUS SYNDROME 11/17/2008   OTHER MALAISE AND FATIGUE 11/17/2008   HYPERTENSION, BENIGN 11/02/2008   ATRIAL TACHYCARDIA 09/16/2008   SYNCOPE, HX OF 08/09/2008   INJURY UNSPEC NERVE SHOULDER GIRDLE&UPPER LIMB 07/12/2008   PERSONAL HISTORY OF MALIGNANT MELANOMA OF SKIN 07/12/2008   LUQ PAIN 12/08/2007   NECK PAIN 09/26/2007   NUMBNESS, ARM 09/26/2007   CPK, ABNORMAL 09/26/2007   ARM PAIN, LEFT 06/25/2007   SWELLING OF LIMB 06/25/2007   OSTEOPENIA 06/25/2007    PCP: Madelin Headings, MD   REFERRING PROVIDER: Kathryne Hitch, MD  REFERRING DIAG: [phone] (ICD-10-CM) - It band syndrome, left  THERAPY DIAG:  Pain in left hip  Muscle weakness (generalized)  Unsteadiness on feet  Pain in right hip  Rationale for Evaluation and Treatment: Rehabilitation  ONSET DATE:  2-3 months (prior to IE)  NEXT MD VISIT: 02/13/23   SUBJECTIVE:   SUBJECTIVE STATEMENT: Pt reports she is dealing with a blood infection which is causing a rash and skin sores - wearing gloves during PT due to this. She reports her oncologist  says she needs to stay in therapy to keep moving.  She reports the ITB on her L leg started acting up yesterday, but no pain in R hip today.  PERTINENT HISTORY:  anxiety, asthma, DM-II, HTN, CVA, DVT, POTS, pacemaker, malignant melanoma with brain, liver and lung metastasis s/p radiation and L parietal craniotomy, osteopenia, diverticulitis, GERD, and Raynaud's disease. R THA 05/19/22 PAIN:  Are you having pain? Yes: NPRS scale:  7/10 Pain location: L ITB  Pain description: sore Aggravating factors: walking Relieving factors: rest  PRECAUTIONS: ICD/Pacemaker and Other: POTS, advised to stay out of heat, has LE lymphedema  RED FLAGS: None   WEIGHT BEARING RESTRICTIONS: No  FALLS:  Has patient fallen in last 6 months? No  LIVING ENVIRONMENT: Lives with: lives with their spouse Lives in: House/apartment Stairs: Yes: Internal: 13 steps; on right going up, on left going up, and can reach both Has following equipment at home: Single point cane, Walker - 2 wheeled, and shower chair  OCCUPATION: medically disabled due to cancer.   PLOF: Independent walking 2-3 miles, 3-4x/week  PATIENT GOALS: be able to get up from floor, work on balance, decrease L hip pain.     OBJECTIVE:   DIAGNOSTIC FINDINGS: 10/08/2022 XR Bilateral Hips An AP pelvis and lateral both hips shows a well-seated right total hip  arthroplasty.  There is no evidence of AVN with the left hip.   PATIENT SURVEYS:  LEFS 46/80  COGNITION: Overall cognitive status: Within functional limits for tasks assessed     SENSATION: Neuropathy bil feet  EDEMA:  History bil LE edema- being treated at lymphedema clinic  MUSCLE LENGTH: Hamstrings: moderate tightness bilaterally  POSTURE:  weight shift left and leg length discrepancy with R LE longer than L  PALPATION: Tenderness over L glutes, L piriforomis, L greater trochanter, along L Itband.   LOWER EXTREMITY ROM:  Active ROM Right eval Left eval  Hip flexion  140 140  Hip extension    Hip internal rotation 25 25  Hip external rotation 60 60   (Blank rows = not tested)  LOWER EXTREMITY MMT:  MMT Right eval Left eval Right  01/07/23 Left 01/07/23  Hip flexion 5 5 4  pain 4  Hip extension 5 4    Hip abduction 5  5  4+ 4+ pain  Hip adduction 5 (seated) 5 (seated) 5 5  Hip internal rotation      Hip external rotation      Knee flexion 5 5    Knee extension 5 5    Ankle dorsiflexion 5 5    Ankle plantarflexion 5 5     (Blank rows = not tested)  LOWER EXTREMITY SPECIAL TESTS:  Hip special tests: Ober's test: positive for pain  FUNCTIONAL TESTS:  5 times sit to stand: 14.5 sec  DGI - 24/24  GAIT: Distance walked: 50 Assistive device utilized: None Level of assistance: Complete Independence Comments: visually slow gait speed.    TODAY'S TREATMENT:                                                                                                                              DATE:   01/28/23 THERAPEUTIC EXERCISE: to improve flexibility, strength and mobility.  Demonstration, verbal and tactile cues throughout for technique.  NuStep - L5 x 6 min Standing lunges with single UE support on back of chair x 10 bil Standing hip openers 2 x 10 bil  Squat + OH raise x 10 - 1 minor LOB (self-corrected) TRX alt side lunges x 8 B side-stepping along counter with looped RTB at ankles 2 x 10 ft Hip hinge with  post along spine x 10  THERAPEUTIC ACTIVITIES: Floor to stand via 1/2 kneel - difficulty raising hips to bring lower leg forward and place foot on ground   01/07/23 Therapeutic Exercise: to improve strength and mobility.  Demo, verbal and tactile cues throughout for technique. Nustep L5 x 7 min Squats x 10  Self Care: Discussion of concerns, recommendations of OTC allergy medications, return to MD, new inserts for shoes.     12/24/22 Therapeutic Activity:  to assess progress towards goals.  Bike L2 x 7 min with  subjective MMT LEFS Discussion of different activities could participate in - recommended trying pilates or chair yoga Manual Therapy: to decrease muscle spasm and pain and improve mobility STM/TPR to bil glutes, TFL Trigger Point Dry-Needling  Treatment instructions: Expect mild to moderate muscle soreness. S/S of pneumothorax if dry needled over a lung field, and to seek immediate medical attention should they occur. Patient verbalized understanding of these instructions and education. Patient Consent Given: Yes Education handout provided: Previously provided Muscles treated: bil glut max, TFL Electrical stimulation performed: No Parameters: N/A Treatment response/outcome: Twitch Response Elicited and Palpable Increase in Muscle Length   PATIENT EDUCATION:  Education details: HEP review Person educated: Patient Education method: Medical illustrator Education comprehension: verbalized understanding and returned demonstration  HOME EXERCISE PROGRAM: Access Code: [email or web address] URL: [email or web address] Date: 11/22/2022 Prepared by: Harrie Foreman  Exercises - Supine ITB Stretch  - 1 x daily - 7 x weekly - 1 sets - 3 reps - 30 sec  hold - Supine Hip Internal and External Rotation  - 1 x daily - 7 x weekly - 2 sets - 10 reps - Supine Bridge with Mini Swiss Ball Between Knees  - 1 x daily - 7 x weekly - 2 sets - 10 reps - Prone Hip Extension with Bent Knee  - 1 x daily - 7 x weekly - 2 sets - 10 reps - ITB Stretch at Wall  - 1 x daily - 7 x weekly - 1 sets - 3 reps - 30 sec hold - Standing Hip Flexor Stretch  - 1 x daily - 7 x weekly - 1 sets - 3 reps - 30 sec hold - Squat with Chair Touch  - 3 x daily - 7 x weekly - 2 sets - 5 reps - Lunge Matrix on Slider  - 1 x daily - 7 x weekly - 2-3 sets - 10 reps - Single Leg Balance with Clock Reach  - 1 x daily - 7 x weekly - 3 sets - 5 reps   ASSESSMENT:  CLINICAL IMPRESSION: Yanitza returns to PT for the first  visit since her plan of care extension on 01/07/23 and reports missing last 3 weeks due to blood infection - currently on antibiotics. She feels like she has probably gotten weaker over this time but states she has still been working on her HEP.  She notes continued difficulty with floor to stand transfers and was unable to "get her brain to tell her legs what to do" during the review today.  Therapeutic exercises and activities focusing on strengthening to help with floor to stand transitions as well as improved stability and activity tolerance with everyday mobility.  She will follow-up with Dr. Magnus Ivan on 01/30/23 about her hip pain.  She states her oncologist wants her to continue with PT to help her keep moving and she plans to also request additional orders from Dr. Magnus Ivan, however explained to her that PT cannot  continue indefinitely and the ultimate goal is that she will be able to take over on her own with a HEP and gym program to allow her to keep up with the recommended activity as indicated by her oncologist.  Marylu Lund will benefit from continued skilled PT to address ongoing deficits to improve mobility and activity tolerance with decreased pain interference and help her prepare for transition to the HEP and gym program.   OBJECTIVE IMPAIRMENTS: Abnormal gait, decreased activity tolerance, decreased balance, decreased endurance, decreased mobility, difficulty walking, decreased strength, impaired flexibility, and pain.   ACTIVITY LIMITATIONS: carrying, lifting, bending, standing, squatting, sleeping, and locomotion level  PARTICIPATION LIMITATIONS: meal prep, cleaning, laundry, and shopping  PERSONAL FACTORS: 3+ comorbidities: anxiety, asthma, DM-II, HTN, CVA, DVT, POTS, pacemaker, malignant melanoma with brain, liver and lung metastasis s/p radiation and L parietal craniotomy, osteopenia, diverticulitis, GERD, and Raynaud's disease. R THA 05/19/22  are also affecting patient's functional outcome.    REHAB POTENTIAL: Good  CLINICAL DECISION MAKING: Evolving/moderate complexity  EVALUATION COMPLEXITY: Moderate   GOALS: Goals reviewed with patient? Yes  SHORT TERM GOALS: Target date: 11/07/2022   Patient will be independent with initial HEP. Baseline: given 11/01/22 Goal status: MET  11/15/22  2.  Patient will undergo further balance testing Baseline:  Goal status: MET  11/01/22 - competed DGI  LONG TERM GOALS: Target date: 01/07/23  extended to 02/18/23  Patient will be independent with advanced/ongoing HEP to improve outcomes and carryover.  Baseline:  Goal status: IN PROGRESS 12/10/22- met for current  2.  Patient will report at least 75% improvement in Left hip pain to improve QOL. Baseline:  Goal status: IN PROGRESS 12/10/22- 100% improvement 12/24/22- pain is starting to return  01/07/23- L hip better this week, 4/10, R hip hurting more 8/10.   3.  Patient will demonstrate improved functional LE strength as demonstrated by 5x STS <14 seconds to decrease fall risk. Baseline: NT Goal status: MET 12/10/22 13.5 seconds  4.  Patient will report at least 9 points improvement on LEFS to demonstrate improved functional ability. Baseline: 46/80 Goal status: MET 0/28/24 57/80  5.  Patient will demonstrate at least 19/24 on DGI to decrease risk of falls. Baseline: NT Goal status: MET  11/01/22 - 24/24  6.  Patient will be able to perform floor to stand transfer without use of furniture to assist safely.  Baseline: gets "stuck" when squatting down to pick up item, has to crawl to get back up.  Goal status: IN PROGRESS  11/19/22- able to perform with supervision today.  12/24/22 - can squat down, still challenged with floor to stand transfers.  01/07/23- still difficult   PLAN:  PT FREQUENCY: 1-2x/week  PT DURATION: 6 weeks  PLANNED INTERVENTIONS: Therapeutic exercises, Therapeutic activity, Neuromuscular re-education, Balance training, Gait training, Patient/Family  education, Self Care, Joint mobilization, Stair training, Orthotic/Fit training, Dry Needling, Spinal mobilization, Moist heat, Taping, Traction, Manual therapy, and Re-evaluation  PLAN FOR NEXT SESSION: practice floor transfers per pt request, continue manual and TrDN, review and progress HEP for LE strengthening and balance.    Marry Guan, PT 01/28/2023, 6:46 PM   Date of referral: 10/09/2022 Referring provider: Kathryne Hitch* Referring diagnosis? M76.32 (ICD-10-CM) - It band syndrome, left Treatment diagnosis? (if different than referring diagnosis) Pain in left hip  Muscle weakness (generalized)  Unsteadiness on feet  Pain in right hip  What was this (referring dx) caused by? Ongoing Issue  Ashby Dawes of Condition: Chronic (continuous duration >  3 months)   Laterality: Both  Current Functional Measure Score: LEFS 57/80  Objective measurements identify impairments when they are compared to normal values, the uninvolved extremity, and prior level of function.  [x]  Yes  []  No  Objective assessment of functional ability: Minimal functional limitations   Briefly describe symptoms: bil hip pain, right worse than left, increase with walking, limiting exercise  How did symptoms start: May 2023  Average pain intensity:  Last 24 hours: 8/10 R hip, 4/10 L hip  Past week: 8/10 R hip, 4/10 L hip  How often does the pt experience symptoms? Frequently  How much have the symptoms interfered with usual daily activities? Quite a bit  How has condition changed since care began at this facility? A little better  In general, how is the patients overall health? Fair   BACK PAIN (STarT Back Screening Tool) No

## 2023-01-28 NOTE — Telephone Encounter (Signed)
Spoke to pt. Pt reports she is worried about her med refill as she is running low. She said she can't sent in request refill early due to her insurance won't allow it. Pt is aware pcp is out today.   Inform pt her refill request is sent to our covering provider to a refill since pcp is out. If not refill today, can have pcp to refill it when she is back tomorrow. Verbalized understanding.   Also inform pt that she can send a request refill early to pharmacy but cannot refill it early as her insurance won't cover it.   Pt wants to know if she can get 3 months supply of Klonopin instead of 30 days supply. Please advise.

## 2023-01-28 NOTE — Telephone Encounter (Signed)
Looks like Padonda webb refilled medication  ib ny absence  ok   At next refill request again for  more  refills and I will  place refill x 2 qirh ongoing care.

## 2023-01-28 NOTE — Telephone Encounter (Signed)
Attempted to reach pt in regards to med- refill.   Attempted to inform pt, PCP is out today but will be back tomorrow. If okay to wait till then.

## 2023-01-30 ENCOUNTER — Other Ambulatory Visit (INDEPENDENT_AMBULATORY_CARE_PROVIDER_SITE_OTHER): Payer: Medicare Other

## 2023-01-30 ENCOUNTER — Ambulatory Visit: Payer: Medicare Other | Admitting: Orthopaedic Surgery

## 2023-01-30 ENCOUNTER — Encounter: Payer: Self-pay | Admitting: Orthopaedic Surgery

## 2023-01-30 DIAGNOSIS — Z96641 Presence of right artificial hip joint: Secondary | ICD-10-CM

## 2023-01-30 NOTE — Progress Notes (Signed)
Carol comes in today at 9 months status post a right total hip arthroplasty.  This was performed secondary to severe AVN with femoral head collapse.  Postoperatively she is doing well except for there is a leg length difference with the right side a few centimeters longer than the left side unfortunately.  That is done off her gait.  She does use a lift in her other shoe that is something she is manufactured on her own.  She has tried several different things.  She is in outpatient physical therapy which she needs to have on a regular basis and even chronically due to severe autonomic dysfunction and chronic autoimmune disease which requires her to have therapy on a regular basis in order to keep her joints moving and to strengthen her upper and lower extremities.  She is a regular patient of Dr. Berton Mount from a cardiology standpoint.  On exam she is walking with a normal normal gait with her left in her opposite left shoe.  When I do have her lay in a supine position there is a leg length difference.  Her right operative hip does move smoothly and fluidly.  There is a little bit of pain on the lateral aspect of her hip when she lays down on that side but overall she looks good.  An AP pelvis shows a well-seated right total hip arthroplasty with no complicating features.  Her left hip femoral head and joint space appears normal.  At this point we will have her continue outpatient physical therapy for overall conditioning and upper and lower extremity strengthening.  We will see her back in 3 months to see how she is doing overall in order to document her condition from an autoimmune standpoint as a relates to musculoskeletal issues so we can have her continue therapy over the next year.  No x-rays are needed at the next visit.

## 2023-01-30 NOTE — Telephone Encounter (Signed)
 Contacted pt. Pt is aware.

## 2023-01-31 ENCOUNTER — Other Ambulatory Visit: Payer: Self-pay

## 2023-01-31 ENCOUNTER — Ambulatory Visit: Payer: Medicare Other | Admitting: Physical Therapy

## 2023-01-31 DIAGNOSIS — Z96641 Presence of right artificial hip joint: Secondary | ICD-10-CM

## 2023-01-31 DIAGNOSIS — M7632 Iliotibial band syndrome, left leg: Secondary | ICD-10-CM

## 2023-01-31 DIAGNOSIS — M7062 Trochanteric bursitis, left hip: Secondary | ICD-10-CM

## 2023-01-31 DIAGNOSIS — M2170 Unequal limb length (acquired), unspecified site: Secondary | ICD-10-CM

## 2023-02-04 ENCOUNTER — Encounter: Payer: Self-pay | Admitting: Physical Therapy

## 2023-02-04 ENCOUNTER — Ambulatory Visit: Payer: Medicare Other | Admitting: Physical Therapy

## 2023-02-04 DIAGNOSIS — M7062 Trochanteric bursitis, left hip: Secondary | ICD-10-CM | POA: Diagnosis not present

## 2023-02-04 DIAGNOSIS — R2681 Unsteadiness on feet: Secondary | ICD-10-CM

## 2023-02-04 DIAGNOSIS — M25551 Pain in right hip: Secondary | ICD-10-CM

## 2023-02-04 DIAGNOSIS — M6281 Muscle weakness (generalized): Secondary | ICD-10-CM | POA: Diagnosis not present

## 2023-02-04 DIAGNOSIS — M25552 Pain in left hip: Secondary | ICD-10-CM

## 2023-02-04 DIAGNOSIS — M2170 Unequal limb length (acquired), unspecified site: Secondary | ICD-10-CM | POA: Diagnosis not present

## 2023-02-04 DIAGNOSIS — Z96641 Presence of right artificial hip joint: Secondary | ICD-10-CM | POA: Diagnosis not present

## 2023-02-04 DIAGNOSIS — M7632 Iliotibial band syndrome, left leg: Secondary | ICD-10-CM | POA: Diagnosis not present

## 2023-02-04 NOTE — Therapy (Addendum)
OUTPATIENT PHYSICAL THERAPY TREATMENT/Discharge Summary    Patient Name: [name] MRN: [id number] DOB:December 10, 1955, 67 y.o., female Today's Date: 02/04/2023  END OF SESSION:  PT End of Session - 02/04/23 1356     Visit Number 16    Date for PT Re-Evaluation 02/18/23    Authorization Type [employer] Medicare    Authorization Time Period 01/07/23 - 02/04/23    Authorization - Number of Visits 4    PT Start Time 1400    PT Stop Time 1445    PT Time Calculation (min) 45 min    Activity Tolerance Patient tolerated treatment well    Behavior During Therapy WFL for tasks assessed/performed               Past Medical History:  Diagnosis Date   Anemia    Angio-edema    Anxiety    Arthritis    Asthma    Atrial fibrillation (HCC)    Atrial tachycardia (HCC)    Autonomic dysfunction    Complication of anesthesia    pt states wakes up with "shakes"   CVA (cerebral infarction)    2012 with dizziness and vision change felt embolic from atrial tachy   Disorder of bone and cartilage, unspecified    Diverticulosis    DM (diabetes mellitus) (HCC)    DVT (deep venous thrombosis) (HCC)    Eczema    Family history of anesthesia complication    PONV and " shaking "   Fatty liver    GERD (gastroesophageal reflux disease)    History of hiatal hernia    History of radiation therapy 10/24/2012   brain   HLD (hyperlipidemia)    HTN (hypertension)    Hypoglycemia, unspecified    Liver metastasis    Lung metastasis    Metastasis to brain (HCC)    Osteopenia    Other nonspecific abnormal serum enzyme levels    Other specified congenital anomalies of nervous system    Pacemaker    autonomic dysfunction   Pancreatitis    from therapy   Peripheral vascular disease (HCC)    POTS (postural orthostatic tachycardia syndrome)    Raynaud's disease    due to chemo   Skin cancer    Hx: of lung lesion   Past Surgical History:  Procedure Laterality Date   ABLATION SAPHENOUS VEIN W/ RFA      2002 x3   CARPAL TUNNEL RELEASE Left 2000   CHOLECYSTECTOMY N/A 03/28/2018   Procedure: LAPAROSCOPIC CHOLECYSTECTOMY WITH INTRAOPERATIVE CHOLANGIOGRAM;  Surgeon: Luretha Murphy, MD;  Location: [facility] OR;  Service: General;  Laterality: N/A;   CRANIOTOMY N/A 09/30/2012   suboccipital craniectomy   CRANIOTOMY Left 02/09/2013   Procedure: LEFT PARIETAL CRANIOTOMY with stealth;  Surgeon: Barnett Abu, MD;  Location: [facility];  Service: Neurosurgery;  Laterality: Left;  LEFT Parietal Craniotomy for tumor with stealth   DEEP AXILLARY SENTINEL NODE BIOPSY / EXCISION     due to extensive Melanoma-right arm   fatty tumor removed  2000   from chest   INSERT / REPLACE / REMOVE PACEMAKER  2012   1999, x 3   LAPAROSCOPY  09/06/2011   Procedure: LAPAROSCOPY OPERATIVE;  Surgeon: Tresa Endo A. Ernestina Penna, MD;  Location: [facility] ORS;  Service: Gynecology;  Laterality: Left;  with Left Ovarian Cystectomy    MELANOMA EXCISION     with removal of lymph nodes, left shoulder   NOSE SURGERY  2010, 2012   for nose bleeds x 2  SINOSCOPY     TONSILLECTOMY AND ADENOIDECTOMY     TOTAL HIP ARTHROPLASTY Right 05/18/2022   Procedure: RIGHT TOTAL HIP ARTHROPLASTY ANTERIOR APPROACH;  Surgeon: Kathryne Hitch, MD;  Location: [facility] ORS;  Service: Orthopedics;  Laterality: Right;   Patient Active Problem List   Diagnosis Date Noted   Status post total replacement of right hip 05/18/2022   Avascular necrosis of bone of right hip (HCC) 04/19/2022   Moderate persistent asthma 12/08/2018   Perennial allergic rhinitis with predominantly nonallergic component 12/08/2018   Allergic conjunctivitis 12/08/2018   History of epistaxis 12/08/2018   Atopic dermatitis 12/08/2018   Recurrent urticaria 12/08/2018   Skeeter syndrome 12/08/2018   Skin lesion of chest wall 07/22/2018   S/P laparoscopic cholecystectomy 03/28/2018   Dehydration 05/18/2016   Type 2 diabetes mellitus without complication, without long-term current use  of insulin (HCC) 11/01/2015   History of pacemaker    POTS (postural orthostatic tachycardia syndrome)    Iron deficiency anemia 11/11/2014   Chronic colitis 08/28/2013   Malignant melanoma, metastatic (HCC) 02/09/2013   Malignant melanoma (HCC) 12/01/2012   Malignant neoplasm metastatic to brain (HCC) 10/16/2012   Hypertension 09/30/2012   Anxiety state 09/30/2012   Hypoxemia 09/30/2012   Disorder of brain 09/08/2012   Dysautonomia (HCC) 02/25/2011   Anticoagulant long-term use 01/19/2011   Neuritis 01/19/2011   Skin change 01/19/2011   Cerebral infarction (HCC)    Palpitation 06/28/2010   Pacemaker-Biotronik 06/28/2010   Dizziness and giddiness 04/27/2010   DIVERTICULOSIS, COLON, HX OF 01/19/2009   CAROTID SINUS SYNDROME 11/17/2008   OTHER MALAISE AND FATIGUE 11/17/2008   HYPERTENSION, BENIGN 11/02/2008   ATRIAL TACHYCARDIA 09/16/2008   SYNCOPE, HX OF 08/09/2008   INJURY UNSPEC NERVE SHOULDER GIRDLE&UPPER LIMB 07/12/2008   PERSONAL HISTORY OF MALIGNANT MELANOMA OF SKIN 07/12/2008   LUQ PAIN 12/08/2007   NECK PAIN 09/26/2007   NUMBNESS, ARM 09/26/2007   CPK, ABNORMAL 09/26/2007   ARM PAIN, LEFT 06/25/2007   SWELLING OF LIMB 06/25/2007   OSTEOPENIA 06/25/2007    PCP: Madelin Headings, MD   REFERRING PROVIDER: Kathryne Hitch, MD  REFERRING DIAG: [phone] (ICD-10-CM) - It band syndrome, left  THERAPY DIAG:  Pain in left hip  Muscle weakness (generalized)  Unsteadiness on feet  Pain in right hip  Rationale for Evaluation and Treatment: Rehabilitation  ONSET DATE:  2-3 months (prior to IE)  NEXT MD VISIT: 02/13/23   SUBJECTIVE:   SUBJECTIVE STATEMENT: Hips are sore today, walked 3.5 miles yesterday, walked 2 miles yesterday.   Doctor said wanted me to keep walking to keep them loose.  Had trouble getting up yesterday from a deep squat.   PERTINENT HISTORY:  anxiety, asthma, DM-II, HTN, CVA, DVT, POTS, pacemaker, malignant melanoma with brain, liver  and lung metastasis s/p radiation and L parietal craniotomy, osteopenia, diverticulitis, GERD, and Raynaud's disease. R THA 05/19/22 PAIN:  Are you having pain? Yes: NPRS scale:  7/10 Pain location: L ITB  Pain description: sore Aggravating factors: walking Relieving factors: rest  PRECAUTIONS: ICD/Pacemaker and Other: POTS, advised to stay out of heat, has LE lymphedema  RED FLAGS: None   WEIGHT BEARING RESTRICTIONS: No  FALLS:  Has patient fallen in last 6 months? No  LIVING ENVIRONMENT: Lives with: lives with their spouse Lives in: House/apartment Stairs: Yes: Internal: 13 steps; on right going up, on left going up, and can reach both Has following equipment at home: Single point cane, Walker - 2 wheeled, and shower chair  OCCUPATION: medically disabled due to cancer.   PLOF: Independent walking 2-3 miles, 3-4x/week  PATIENT GOALS: be able to get up from floor, work on balance, decrease L hip pain.     OBJECTIVE:   DIAGNOSTIC FINDINGS: 10/08/2022 XR Bilateral Hips An AP pelvis and lateral both hips shows a well-seated right total hip  arthroplasty.  There is no evidence of AVN with the left hip.   PATIENT SURVEYS:  LEFS 46/80  COGNITION: Overall cognitive status: Within functional limits for tasks assessed     SENSATION: Neuropathy bil feet  EDEMA:  History bil LE edema- being treated at lymphedema clinic  MUSCLE LENGTH: Hamstrings: moderate tightness bilaterally  POSTURE:  weight shift left and leg length discrepancy with R LE longer than L  PALPATION: Tenderness over L glutes, L piriforomis, L greater trochanter, along L Itband.   LOWER EXTREMITY ROM:  Active ROM Right eval Left eval  Hip flexion 140 140  Hip extension    Hip internal rotation 25 25  Hip external rotation 60 60   (Blank rows = not tested)  LOWER EXTREMITY MMT:  MMT Right eval Left eval Right  01/07/23 Left 01/07/23  Hip flexion 5 5 4  pain 4  Hip extension 5 4    Hip  abduction 5  5  4+ 4+ pain  Hip adduction 5 (seated) 5 (seated) 5 5  Hip internal rotation      Hip external rotation      Knee flexion 5 5    Knee extension 5 5    Ankle dorsiflexion 5 5    Ankle plantarflexion 5 5     (Blank rows = not tested)  LOWER EXTREMITY SPECIAL TESTS:  Hip special tests: Ober's test: positive for pain  FUNCTIONAL TESTS:  5 times sit to stand: 14.5 sec  DGI - 24/24  GAIT: Distance walked: 50 Assistive device utilized: None Level of assistance: Complete Independence Comments: visually slow gait speed.    TODAY'S TREATMENT:                                                                                                                              DATE:   02/04/23 Therapeutic Exercise: to improve strength and mobility.  Demo, verbal and tactile cues throughout for technique. Bike L2 x 6 min  Review of HEP - focusing on squats, lunges, tall knee to 1/2 kneel.  Given chart tracker sheet.  Access Code: [email or web address] URL: [email or web address] Date: 02/04/2023 Prepared by: Harrie Foreman  Exercises - Squat  - 1 x daily - 7 x weekly - 3 sets - 10 reps - Lunge with Counter Support  - 1 x daily - 7 x weekly - 3 sets - 10 reps - Tall Kneeling Hip Hinge  - 1 x daily - 7 x weekly - 3 sets - 10 reps  Self Care: Information on different gyms, especially [facility], which has exercise physiologists, cancer  patient programs, personal training, balance training programs, etc.  Explained the difference between rehabilitative care and maintenance care, and what skilled physical therapy requires.  Also discussed different ways to get up from floor and recommended sitting on small stool rather than sitting in deep squat.   Provided information on cobbler in [place] with reasonable rates.   01/28/23 THERAPEUTIC EXERCISE: to improve flexibility, strength and mobility.  Demonstration, verbal and tactile cues throughout for  technique.  NuStep - L5 x 6 min Standing lunges with single UE support on back of chair x 10 bil Standing hip openers 2 x 10 bil  Squat + OH raise x 10 - 1 minor LOB (self-corrected) TRX alt side lunges x 8 B side-stepping along counter with looped RTB at ankles 2 x 10 ft Hip hinge with post along spine x 10  THERAPEUTIC ACTIVITIES: Floor to stand via 1/2 kneel - difficulty raising hips to bring lower leg forward and place foot on ground   01/07/23 Therapeutic Exercise: to improve strength and mobility.  Demo, verbal and tactile cues throughout for technique. Nustep L5 x 7 min Squats x 10  Self Care: Discussion of concerns, recommendations of OTC allergy medications, return to MD, new inserts for shoes.     PATIENT EDUCATION:  Education details: HEP review Person educated: Patient Education method: Medical illustrator Education comprehension: verbalized understanding and returned demonstration  HOME EXERCISE PROGRAM: Access Code: [email or web address] URL: [email or web address] Date: 11/22/2022 Prepared by: Harrie Foreman  Exercises - Supine ITB Stretch  - 1 x daily - 7 x weekly - 1 sets - 3 reps - 30 sec  hold - Supine Hip Internal and External Rotation  - 1 x daily - 7 x weekly - 2 sets - 10 reps - Supine Bridge with Mini Swiss Ball Between Knees  - 1 x daily - 7 x weekly - 2 sets - 10 reps - Prone Hip Extension with Bent Knee  - 1 x daily - 7 x weekly - 2 sets - 10 reps - ITB Stretch at Wall  - 1 x daily - 7 x weekly - 1 sets - 3 reps - 30 sec hold - Standing Hip Flexor Stretch  - 1 x daily - 7 x weekly - 1 sets - 3 reps - 30 sec hold - Squat with Chair Touch  - 3 x daily - 7 x weekly - 2 sets - 5 reps - Lunge Matrix on Slider  - 1 x daily - 7 x weekly - 2-3 sets - 10 reps - Single Leg Balance with Clock Reach  - 1 x daily - 7 x weekly - 3 sets - 5 reps   ASSESSMENT:  CLINICAL IMPRESSION: [name] has met most of her goals.  She does have  chronic hip/ITBand pain but she also has significant leg length discrepancy and enjoys long walks (walked over 5 miles this weekend) which aggravates the conditions, offered her information on local cobbler with reasonable rates to see if this helps. Other than that and stretches, unable to offer other solutions.  Her orthopedist and cancer doctor recommended continued physical therapy to keep her "joints moving" however explained that this does not mean she requires skilled physical therapy as she is not a fall risk and can exercise safely without supervision.  She does have difficulty getting up from a deep squat, but she has been given exercises to work on to build strength to address.  As for accountability, recommended that she could  work with a Systems analyst.  [facility] has as program that may be covered by her insurance that has exercise physiologists as well as balance classes and classes specifically for cancer patients.  She had no questions on her HEP, gave her a chart tracker to help with accountability and marking progress with strengthening.  At this time discharged as she no longer requires skilled physical therapy.    OBJECTIVE IMPAIRMENTS: Abnormal gait, decreased activity tolerance, decreased balance, decreased endurance, decreased mobility, difficulty walking, decreased strength, impaired flexibility, and pain.   ACTIVITY LIMITATIONS: carrying, lifting, bending, standing, squatting, sleeping, and locomotion level  PARTICIPATION LIMITATIONS: meal prep, cleaning, laundry, and shopping  PERSONAL FACTORS: 3+ comorbidities: anxiety, asthma, DM-II, HTN, CVA, DVT, POTS, pacemaker, malignant melanoma with brain, liver and lung metastasis s/p radiation and L parietal craniotomy, osteopenia, diverticulitis, GERD, and Raynaud's disease. R THA 05/19/22  are also affecting patient's functional outcome.   REHAB POTENTIAL: Good  CLINICAL DECISION MAKING:  Evolving/moderate complexity  EVALUATION COMPLEXITY: Moderate   GOALS: Goals reviewed with patient? Yes  SHORT TERM GOALS: Target date: 11/07/2022   Patient will be independent with initial HEP. Baseline: given 11/01/22 Goal status: MET  11/15/22  2.  Patient will undergo further balance testing Baseline:  Goal status: MET  11/01/22 - competed DGI  LONG TERM GOALS: Target date: 01/07/23  extended to 02/18/23  Patient will be independent with advanced/ongoing HEP to improve outcomes and carryover.  Baseline:  Goal status: MET 12/10/22- met for current 02/04/23- reviewed, updated, compliant, no questions.    2.  Patient will report at least 75% improvement in Left hip pain to improve QOL. Baseline:  Goal status: IN PROGRESS 12/10/22- 100% improvement 12/24/22- pain is starting to return  01/07/23- L hip better this week, 4/10, R hip hurting more 8/10.   3.  Patient will demonstrate improved functional LE strength as demonstrated by 5x STS <14 seconds to decrease fall risk. Baseline: NT Goal status: MET 12/10/22 13.5 seconds  4.  Patient will report at least 9 points improvement on LEFS to demonstrate improved functional ability. Baseline: 46/80 Goal status: MET 0/28/24 57/80  5.  Patient will demonstrate at least 19/24 on DGI to decrease risk of falls. Baseline: NT Goal status: MET  11/01/22 - 24/24  6.  Patient will be able to perform floor to stand transfer without use of furniture to assist safely.  Baseline: gets "stuck" when squatting down to pick up item, has to crawl to get back up.  Goal status: IN PROGRESS  11/19/22- able to perform with supervision today.  12/24/22 - can squat down, still challenged with floor to stand transfers.  01/07/23- still difficult 02/04/23- was able to get into deep squat and return today.    PLAN:  PT FREQUENCY: 1-2x/week  PT DURATION: 6 weeks  PLANNED INTERVENTIONS: Therapeutic exercises, Therapeutic activity, Neuromuscular re-education,  Balance training, Gait training, Patient/Family education, Self Care, Joint mobilization, Stair training, Orthotic/Fit training, Dry Needling, Spinal mobilization, Moist heat, Taping, Traction, Manual therapy, and Re-evaluation  PLAN FOR NEXT SESSION: discharge to HEP    Jena Gauss, PT 02/04/2023, 3:14 PM   PHYSICAL THERAPY DISCHARGE SUMMARY  Visits from Start of Care: 16  Current functional level related to goals / functional outcomes: 4/6 goals met, improved functional strength, decreased hip pain   Remaining deficits: Bil Hip pain   Education / Equipment: HEP  Plan: Patient is being discharged due to meeting the stated rehab goals.

## 2023-02-05 DIAGNOSIS — L309 Dermatitis, unspecified: Secondary | ICD-10-CM | POA: Diagnosis not present

## 2023-02-06 ENCOUNTER — Encounter: Payer: Self-pay | Admitting: Physical Therapy

## 2023-02-06 ENCOUNTER — Encounter: Payer: Medicare Other | Admitting: Physical Therapy

## 2023-02-11 ENCOUNTER — Encounter: Payer: Medicare Other | Admitting: Physical Therapy

## 2023-02-13 ENCOUNTER — Ambulatory Visit: Payer: Medicare Other | Admitting: Orthopaedic Surgery

## 2023-02-14 ENCOUNTER — Encounter: Payer: Medicare Other | Admitting: Physical Therapy

## 2023-02-18 ENCOUNTER — Encounter: Payer: Medicare Other | Admitting: Physical Therapy

## 2023-02-21 ENCOUNTER — Encounter: Payer: Medicare Other | Admitting: Physical Therapy

## 2023-02-25 ENCOUNTER — Encounter: Payer: Medicare Other | Admitting: Physical Therapy

## 2023-02-28 ENCOUNTER — Encounter: Payer: Medicare Other | Admitting: Physical Therapy

## 2023-03-04 ENCOUNTER — Encounter: Payer: Medicare Other | Admitting: Physical Therapy

## 2023-03-04 ENCOUNTER — Other Ambulatory Visit: Payer: Medicare Other

## 2023-03-05 ENCOUNTER — Other Ambulatory Visit (INDEPENDENT_AMBULATORY_CARE_PROVIDER_SITE_OTHER): Payer: Medicare Other

## 2023-03-05 DIAGNOSIS — Z79899 Other long term (current) drug therapy: Secondary | ICD-10-CM

## 2023-03-05 NOTE — Progress Notes (Signed)
 03/05/2023 Name: [name] MRN: [id number] DOB: [birth year]  Chief Complaint  Patient presents with   Medication Management    [name] is a 68 y.o. year old female who presented for a telephone visit.   They were referred to the pharmacist by their PCP for assistance in managing complex medication management.    Subjective:  Care Team: Primary Care Provider: Charlett Apolinar POUR, MD   Medication Access/Adherence  Current Pharmacy:  [id number] - [place], [place] - [address] [place] [address] Phone: [phone] Fax: [phone]  [id number] - [place], [name] - [name] [place] [address] Phone: [phone] Fax: [phone]  [place] [facility] [address] [name] [address] Phone: [phone] Fax: [phone]   Patient reports affordability concerns with their medications: No  Patient reports access/transportation concerns to their pharmacy: No  Patient reports adherence concerns with their medications:  No     Medication Management:  Current adherence strategy: Keeps her medication in a chest in alphabetical order. Does not want to use a daily pill organizer due to so many prn meds  Patient reports Good adherence to medications  Objective:  Lab Results  Component Value Date   HGBA1C 5.7 (H) 05/08/2022    Lab Results  Component Value Date   CREATININE 0.90 10/11/2022   BUN 14 10/11/2022   NA 141 10/11/2022   K 4.0 10/11/2022   CL 102 10/11/2022   CO2 22 10/11/2022    Lab Results  Component Value Date   CHOL 220 (H) 07/07/2021   HDL 55 07/07/2021   LDLCALC 133 (H) 07/07/2021   LDLDIRECT 129 (H) 07/07/2021   TRIG 179 (H) 07/07/2021   CHOLHDL 4.0 07/07/2021    Medications Reviewed Today     Reviewed by [name], RPH (Pharmacist) on 03/05/23 at 1321  Med List Status: <None>   Medication Order  Taking? Sig Documenting Provider Last Dose Status Informant  acetaminophen  (TYLENOL ) 500 MG tablet [id number]  Take 500 mg by mouth See admin instructions. Take 500 mg by mouth at bedtime and an additional 500 mg three times a day as needed for pain [provider]  Active Self           Med Note (MENDOZA MENDEZ, CARLOS A   Tue May 31, 2020  2:15 PM)    albuterol  (PROVENTIL ) (2.5 MG/3ML) 0.083% nebulizer solution [id number]  Take 3 mLs (2.5 mg total) by nebulization every 4 (four) hours as needed for wheezing or shortness of breath. Bobbitt, Elgin Pepper, MD  Active Self  albuterol  (VENTOLIN  HFA) 108 (90 Base) MCG/ACT inhaler [id number]  INHALE 2 PUFFS INTO THE LUNGS EVERY 4 (FOUR) HOURS AS NEEDED FOR WHEEZING OR SHORTNESS OF BREATH. Panosh, Apolinar POUR, MD  Active Self  Ascorbic Acid  (VITAMIN C ) 1000 MG tablet [id number]  Take 2,000 mg by mouth daily. [provider]  Active Self  aspirin  81 MG chewable tablet [id number]  Chew 1 tablet (81 mg total) by mouth 2 (two) times daily. Gretta Bertrum ORN, PA-C  Active   augmented betamethasone  dipropionate (DIPROLENE ) 0.05 % ointment [id number]  Apply topically 2 (two) times daily. Limit to no longer than 2 weeks use. Not on face.  Patient taking differently: Apply 1 Application topically 2 (two) times daily as needed (hives). Limit to no longer than 2 weeks use. Not on face.  Panosh, Apolinar POUR, MD  Active Self  benzonatate  (TESSALON ) 200 MG capsule [id number]  Take 1 capsule (200 mg total) by mouth 2 (two) times daily as needed for cough. Nafziger, Darleene, NP  Active Self  budesonide  (ENTOCORT EC ) 3 MG 24 hr capsule [id number]  Take 3 mg by mouth as needed for rash. [provider]  Active Self  calcium  carbonate (TUMS EX) 750 MG chewable tablet [id number]  Chew 1 tablet by mouth daily. [provider]  Active Self  cephALEXin  (KEFLEX ) 500 MG capsule [id number]  Take 500 mg by mouth 2 (two) times daily. [provider]  Active Self   clobetasol cream (TEMOVATE) 0.05 % [id number]  Apply 1 application topically 2 (two) times daily as needed (rash). [provider]  Active Self  clobetasol ointment (TEMOVATE) 0.05 % [id number] Yes Apply two times a day as needed [provider]  Active   clonazePAM  (KLONOPIN ) 1 MG tablet [id number]  TAKE ONE TABLET BY MOUTH 2-3 TIMES A DAY AS NEEDED Webb, Padonda B, FNP  Active   cloNIDine  (CATAPRES ) 0.1 MG tablet [id number]  TAKE 1 TABLET BY MOUTH AT BEDTIME. Fernande Elspeth BROCKS, MD  Active   cyanocobalamin  (VITAMIN B12) 1000 MCG/ML injection [id number]  INJECT 1ML INTRAMUSCULARLY EVERY 30 DAYS Webb, Padonda B, [place]  Active   Desoximetasone  (TOPICORT ) 0.25 % ointment [id number]  Apply 1 application topically 2 (two) times daily.  Patient taking differently: Apply 1 application  topically 2 (two) times daily as needed (rash).   Panosh, Apolinar POUR, MD  Active Self  diphenhydrAMINE  (BENADRYL ) 25 MG tablet [id number]  Take 25-50 mg by mouth daily as needed for allergies. [provider]  Active Self  Dupilumab (DUPIXENT) 300 MG/2ML EMMANUEL [id number] Yes Inject into the skin. [provider]  Active            Med Note [name], Reema Chick H   Tue Mar 05, 2023  1:20 PM) Every other week. Not started yet, pending insurance approval  EPINEPHrine  0.3 mg/0.3 mL IJ SOAJ injection [id number]  Inject 0.3 mg into the muscle as needed for anaphylaxis. [provider]  Active Self  fexofenadine (ALLEGRA) 180 MG tablet [id number]  Take 180 mg by mouth daily as needed for allergies or rhinitis. [provider]  Active Self  Fluocinonide 0.1 % CREA [id number]  Apply 1 application  topically in the morning and at bedtime. On affected areas [provider]  Active   fluticasone  (FLONASE ) 50 MCG/ACT nasal spray [id number]  Place 2 sprays into both nostrils daily.  Patient taking differently: Place 2 sprays into both nostrils daily as needed for allergies.   Leath-WarrenEtta PARAS, NP  Active Self  furosemide  (LASIX ) 20 MG tablet [id number]  TAKE 1 TABLET BY MOUTH DAILY AS NEEDED Fernande Elspeth BROCKS, MD  Active   HYDROcodone -acetaminophen  (NORCO/VICODIN) 5-325 MG tablet [id number]  Take 1-2 tablets by mouth every 6 (six) hours as needed for moderate pain. Vernetta Lonni GRADE, MD  Active   hydrocortisone  (ANUSOL -HC) 25 MG suppository [id number]  Place 25 mg rectally as needed for hemorrhoids. [provider]  Active Self  ibuprofen  (ADVIL ) 800 MG tablet [id number]  Take 1 tablet (800 mg total) by mouth every 8 (eight) hours as needed. Vernetta Lonni GRADE, MD  Active   labetalol  (NORMODYNE ) 100 MG tablet [id number]  Take 0.5 tablets (50 mg total) by mouth 2 (two) times daily. Fernande Elspeth BROCKS, MD  Active   levETIRAcetam  (KEPPRA ) 250 MG tablet [id number]  Take 250 mg by mouth See admin instructions. Take 250 mg by mouth at 8 AM, 2 PM, and 8 PM [provider]  Active Self           Med Note ELSWORTH, JACQUELINE L   Tue Jun 27, 2021  3:16 PM)    levocetirizine (XYZAL ) 5 MG tablet [id number]  TAKE 1 TABLET BY MOUTH EVERY DAY AS NEEDED Bobbitt, Elgin Pepper, MD  Active Self  lidocaine  (XYLOCAINE ) 2 % solution [id number]  5mL swish and swallow as needed for sore throat; take with nystatin  susp Burnette, Jennifer M, PA-C  Active Self  methocarbamol  (ROBAXIN ) 500 MG tablet [id number]  Take 1 tablet (500 mg total) by mouth every 6 (six) hours as needed for muscle spasms. Gretta Bertrum ORN, PA-C  Active   mupirocin  ointment (BACTROBAN ) 2 % [id number]   Vernetta Lonni GRADE, MD  Active   nystatin  (MYCOSTATIN ) 100000 UNIT/ML suspension [id number]  Use as directed 5 mLs (500,000 Units total) in the mouth or throat 3 (three) times daily as needed (thrush).  Patient taking differently: Use as directed 3 mLs in the mouth or throat 3 (three) times daily as needed (thrush).   Panosh, Apolinar POUR, MD  Active Self  Olopatadine  HCl (PATADAY ) 0.2 % SOLN [id number]  Use one drop in each eye  once daily as needed.  Patient taking differently: Place 1 drop into both eyes daily as needed (itchy eyes).   Bobbitt, Elgin Pepper, MD  Active Self  ondansetron  (ZOFRAN -ODT) 4 MG disintegrating tablet [id number]  Take 4 mg by mouth every 8 (eight) hours as needed for nausea/vomiting. [provider]  Active Self  polyethylene glycol (MIRALAX  / GLYCOLAX ) packet [id number]  Take 17 g by mouth at bedtime. [provider]  Active Self  potassium chloride  SA (KLOR-CON  M) 20 MEQ tablet [id number]  Take 1 tablet (20 mEq total) by mouth 2 (two) times daily.  Patient taking differently: Take 20 mEq by mouth See admin instructions. Take 20 mEq by mouth at 8 AM and 2 PM   Fernande Elspeth BROCKS, MD  Active Self  Probiotic Product (PROBIOTIC PO) [id number]  Take 2 capsules by mouth daily. [provider]  Active Self  prochlorperazine  (COMPAZINE ) 10 MG tablet [id number]  Take 1 tablet (10 mg total) by mouth every 6 (six) hours as needed for nausea or vomiting. Johnny Garnette LABOR, MD  Active Self  sodium chloride  (OCEAN) 0.65 % SOLN nasal spray [id number]  Place 1 spray into both nostrils 2 (two) times daily. [provider]  Active Self  Spacer/Aero-Holding [facility] DIAMOND) MISC [id number]  See admin instructions. [provider]  Active Self  Syringe/Needle, Disp, (SYRINGE 3CC/27GX1-1/4) 27G X 1-1/4 3 ML MISC [id number]  Inject 1 mL into the muscle every 30 (thirty) days. Vit B12 Panosh, Wanda K, MD  Active Self  tiZANidine  (ZANAFLEX ) 2 MG tablet [id number]  Take 1 tablet (2 mg total) by mouth every 6 (six) hours as needed for muscle spasms. Vernetta Lonni GRADE, MD  Active   Vitamin D , Ergocalciferol , (DRISDOL ) 50000 units CAPS capsule [id number]  TAKE 1 CAPSULE (50,000 UNITS TOTAL) BY MOUTH EVERY 7 (SEVEN) DAYS.  Patient taking differently: Take 50,000 Units by mouth every Saturday.   Fernande Elspeth BROCKS, MD  Active Self  Zinc  50 MG TABS [id number]  Take 50 mg by mouth  daily. [provider]  Active Self              Assessment/Plan:   Medication  Management: - Currently strategy sufficient to maintain appropriate adherence to prescribed medication regimen - Updated myChart list of medication, indication, and administration time.      Follow Up Plan: 6 months  Jon VEAR Lindau, PharmD Clinical Pharmacist [phone]

## 2023-03-06 ENCOUNTER — Encounter: Payer: Medicare Other | Admitting: Physical Therapy

## 2023-03-11 ENCOUNTER — Encounter: Payer: Medicare Other | Admitting: Physical Therapy

## 2023-03-13 ENCOUNTER — Encounter: Payer: Medicare Other | Admitting: Physical Therapy

## 2023-03-15 ENCOUNTER — Ambulatory Visit (INDEPENDENT_AMBULATORY_CARE_PROVIDER_SITE_OTHER): Payer: Medicare Other | Admitting: Family Medicine

## 2023-03-15 ENCOUNTER — Encounter: Payer: Self-pay | Admitting: Family Medicine

## 2023-03-15 VITALS — BP 132/86 | HR 75 | Temp 97.7°F | Wt 159.2 lb

## 2023-03-15 DIAGNOSIS — Z4802 Encounter for removal of sutures: Secondary | ICD-10-CM | POA: Diagnosis not present

## 2023-03-15 NOTE — Progress Notes (Unsigned)
Established Patient Office Visit  Subjective   Patient ID: [name], female    DOB: Nov 24, 1955  Age: 68 y.o. MRN: [id number]  Chief Complaint  Patient presents with   Suture / Staple Removal    HPI  {History (Optional):23778} [name] is here for suture removal.  She has history of metastatic melanoma followed at [facility].  She saw dermatologist at [facility] couple weeks ago and had couple punch biopsies right upper thigh region.  Her husband tried to remove one of the sutures but had some difficulty.  They set up appointment today to get the other suture removed.  She has had no difficulty with wound healing.  No erythema or drainage.  Her biopsies did not show any evidence for cancer.  Past Medical History:  Diagnosis Date   Anemia    Angio-edema    Anxiety    Arthritis    Asthma    Atrial fibrillation (HCC)    Atrial tachycardia (HCC)    Autonomic dysfunction    Complication of anesthesia    pt states wakes up with "shakes"   CVA (cerebral infarction)    2012 with dizziness and vision change felt embolic from atrial tachy   Disorder of bone and cartilage, unspecified    Diverticulosis    DM (diabetes mellitus) (HCC)    DVT (deep venous thrombosis) (HCC)    Eczema    Family history of anesthesia complication    PONV and " shaking "   Fatty liver    GERD (gastroesophageal reflux disease)    History of hiatal hernia    History of radiation therapy 10/24/2012   brain   HLD (hyperlipidemia)    HTN (hypertension)    Hypoglycemia, unspecified    Liver metastasis    Lung metastasis    Metastasis to brain (HCC)    Osteopenia    Other nonspecific abnormal serum enzyme levels    Other specified congenital anomalies of nervous system    Pacemaker    autonomic dysfunction   Pancreatitis    from therapy   Peripheral vascular disease (HCC)    POTS (postural orthostatic tachycardia syndrome)    Raynaud's disease    due to chemo   Skin cancer    Hx: of  lung lesion   Past Surgical History:  Procedure Laterality Date   ABLATION SAPHENOUS VEIN W/ RFA     2002 x3   CARPAL TUNNEL RELEASE Left 2000   CHOLECYSTECTOMY N/A 03/28/2018   Procedure: LAPAROSCOPIC CHOLECYSTECTOMY WITH INTRAOPERATIVE CHOLANGIOGRAM;  Surgeon: Luretha Murphy, MD;  Location: [facility] OR;  Service: General;  Laterality: N/A;   CRANIOTOMY N/A 09/30/2012   suboccipital craniectomy   CRANIOTOMY Left 02/09/2013   Procedure: LEFT PARIETAL CRANIOTOMY with stealth;  Surgeon: Barnett Abu, MD;  Location: [facility];  Service: Neurosurgery;  Laterality: Left;  LEFT Parietal Craniotomy for tumor with stealth   DEEP AXILLARY SENTINEL NODE BIOPSY / EXCISION     due to extensive Melanoma-right arm   fatty tumor removed  2000   from chest   INSERT / REPLACE / REMOVE PACEMAKER  2012   1999, x 3   LAPAROSCOPY  09/06/2011   Procedure: LAPAROSCOPY OPERATIVE;  Surgeon: Tresa Endo A. Ernestina Penna, MD;  Location: [facility] ORS;  Service: Gynecology;  Laterality: Left;  with Left Ovarian Cystectomy    MELANOMA EXCISION     with removal of lymph nodes, left shoulder   NOSE SURGERY  2010, 2012   for nose bleeds  x 2   SINOSCOPY     TONSILLECTOMY AND ADENOIDECTOMY     TOTAL HIP ARTHROPLASTY Right 05/18/2022   Procedure: RIGHT TOTAL HIP ARTHROPLASTY ANTERIOR APPROACH;  Surgeon: Kathryne Hitch, MD;  Location: [facility] ORS;  Service: Orthopedics;  Laterality: Right;    reports that she has never smoked. She has never used smokeless tobacco. She reports that she does not drink alcohol and does not use drugs. family history includes Allergic rhinitis in her mother and sister; Aneurysm in her sister; Clotting disorder in her maternal grandmother; Colon cancer in her mother; Colon polyps in her mother; Crohn's disease in her maternal grandmother; Diabetes in her father, maternal grandmother, and paternal grandmother; Heart disease in her father; Kidney disease in her father; Stroke in her mother; Urticaria in her  mother. Allergies  Allergen Reactions   Bee Venom Swelling   Doxycycline Hives   Erythromycin Hives   Gadavist [Gadobutrol] Hives, Shortness Of Breath and Itching   Paxlovid [Nirmatrelvir-Ritonavir] Swelling and Other (See Comments)    Mouth swelling, mouth ulcers   Penicillins Hives    Did it involve swelling of the face/tongue/throat, SOB, or low BP? No Did it involve sudden or severe rash/hives, skin peeling, or any reaction on the inside of your mouth or nose? Yes Did you need to seek medical attention at a hospital or doctor's office? Yes When did it last happen?      5+ years If all above answers are "NO", may proceed with cephalosporin use.    Sulfa Antibiotics Hives   Vancomycin Hives, Rash and Other (See Comments)    Develops "Red Man" Syndrome and welts/hives if it is run too quickly via IV- must be run slowly   Preparation H [Pramox-Pe-Glycerin-Petrolatum] Hives    Cannot use  Cream or Suppositories    Phenergan [Promethazine Hcl] Other (See Comments)    Causes restless leg syndrome    Review of Systems  Constitutional:  Negative for chills and fever.      Objective:     BP 132/86 (BP Location: Left Arm, Patient Position: Sitting, Cuff Size: Normal)   Pulse 75   Temp 97.7 F (36.5 C) (Oral)   Wt 159 lb 3.2 oz (72.2 kg)   SpO2 99%   BMI 28.20 kg/m  {Vitals History (Optional):23777}  Physical Exam Vitals reviewed.  Cardiovascular:     Rate and Rhythm: Normal rate and regular rhythm.  Skin:    Comments: Biopsy sites examined.  She has 1 suture intact and this was removed without difficulty.  Other site revealed very small eschar.  We did not see any retained suture pieces there.  Neurological:     Mental Status: She is alert.      No results found for any visits on 03/15/23.  {Labs (Optional):23779}  The ASCVD Risk score (Arnett DK, et al., 2019) failed to calculate for the following reasons:   Risk score cannot be calculated because patient has a  medical history suggesting prior/existing ASCVD    Assessment & Plan:   Recent punch biopsies at [facility].  Suture removed.  No evidence or secondary infection.  [name], MD

## 2023-03-15 NOTE — Progress Notes (Unsigned)
o

## 2023-03-19 ENCOUNTER — Encounter (INDEPENDENT_AMBULATORY_CARE_PROVIDER_SITE_OTHER): Payer: Medicare Other | Admitting: Family Medicine

## 2023-03-19 NOTE — Progress Notes (Signed)
Error

## 2023-03-20 ENCOUNTER — Encounter: Payer: Medicare Other | Admitting: Physical Therapy

## 2023-03-25 ENCOUNTER — Encounter: Payer: Medicare Other | Admitting: Physical Therapy

## 2023-03-27 ENCOUNTER — Encounter: Payer: Medicare Other | Admitting: Physical Therapy

## 2023-04-08 ENCOUNTER — Ambulatory Visit: Payer: Self-pay | Admitting: Internal Medicine

## 2023-04-08 ENCOUNTER — Telehealth: Payer: Medicare Other | Admitting: Internal Medicine

## 2023-04-08 ENCOUNTER — Other Ambulatory Visit: Payer: Self-pay | Admitting: Family

## 2023-04-08 ENCOUNTER — Telehealth: Payer: Self-pay | Admitting: Internal Medicine

## 2023-04-08 DIAGNOSIS — L2089 Other atopic dermatitis: Secondary | ICD-10-CM | POA: Diagnosis not present

## 2023-04-08 DIAGNOSIS — J208 Acute bronchitis due to other specified organisms: Secondary | ICD-10-CM

## 2023-04-08 DIAGNOSIS — J22 Unspecified acute lower respiratory infection: Secondary | ICD-10-CM | POA: Diagnosis not present

## 2023-04-08 DIAGNOSIS — B9689 Other specified bacterial agents as the cause of diseases classified elsewhere: Secondary | ICD-10-CM

## 2023-04-08 DIAGNOSIS — J4540 Moderate persistent asthma, uncomplicated: Secondary | ICD-10-CM | POA: Diagnosis not present

## 2023-04-08 MED ORDER — CLONAZEPAM 1 MG PO TABS: ORAL_TABLET | ORAL | 0 refills | Status: DC

## 2023-04-08 MED ORDER — ALBUTEROL SULFATE (2.5 MG/3ML) 0.083% IN NEBU
2.5000 mg | INHALATION_SOLUTION | RESPIRATORY_TRACT | 1 refills | Status: AC | PRN
Start: 2023-04-08 — End: ?

## 2023-04-08 MED ORDER — BENZONATATE 200 MG PO CAPS: 200.0000 mg | ORAL_CAPSULE | Freq: Two times a day (BID) | ORAL | 0 refills | Status: DC | PRN

## 2023-04-08 MED ORDER — ALBUTEROL SULFATE HFA 108 (90 BASE) MCG/ACT IN AERS: 2.0000 | INHALATION_SPRAY | RESPIRATORY_TRACT | 0 refills | Status: AC | PRN

## 2023-04-08 MED ORDER — CEPHALEXIN 500 MG PO CAPS: 500.0000 mg | ORAL_CAPSULE | Freq: Two times a day (BID) | ORAL | 0 refills | Status: AC

## 2023-04-08 NOTE — Progress Notes (Signed)
Virtual Visit via Video Note  I connected with [name] on 04/08/23 at  2:20 PM EST by a video enabled telemedicine application and verified that I am speaking with the correct person using two identifiers.  The patient and the provider were at separate locations throughout the entire encounter. Patient location: home, Provider location: work   I discussed the limitations of evaluation and management by telemedicine and the availability of in person appointments. The patient expressed understanding and agreed to proceed. The patient and the provider were the only parties present for the visit unless noted in HPI below.  History of Present Illness: The patient is a 68 y.o. female with visit for 1 week cold symptoms. Fevers and chills. Having diarrhea and vomiting. Started with headache and head congestion. Has stage 4 melanoma in remission currently. Has albuterol nebulizer and inhaler to use. Using delsym and has tried mucinex. Just started loading dose of dupixent about 1 week before symptoms. She does not do flu shots.   Observations/Objective: Appearance: coughing during visit, breathing appears short with breaths in middle of sentence rarely during conversation, casual grooming, mental status is A and O times 3  Assessment and Plan: See problem oriented charting  Follow Up Instructions: rx keflex for worsening symptoms after 1 week, refill albuterol inhaler and nebulizer solution, rx tessalon perles  I discussed the assessment and treatment plan with the patient. The patient was provided an opportunity to ask questions and all were answered. The patient agreed with the plan and demonstrated an understanding of the instructions.   The patient was advised to call back or seek an in-person evaluation if the symptoms worsen or if the condition fails to improve as anticipated.  Myrlene Broker, MD

## 2023-04-08 NOTE — Telephone Encounter (Signed)
 Copied from CRM [id number]. Topic: Clinical - Pink Word Triage >> Apr 08, 2023  9:32 AM Crist Dominion wrote: Patient states she is having diarrhea with body aches and states she has the flu and will request a telehealth appointment..    Chief Complaint: cough, flu-like symtposm Symptoms: coigh, body aches, diarrhea Frequency: constant, getting worse Pertinent Negatives: Patient denies covid exposure Disposition: [] ED /[] Urgent Care (no appt availability in office) / [x] Appointment(In office/virtual)/ []  [facility]/ [] Home Care/ [] Refused Recommended Disposition /[] [name]/ []  Follow-up with PCP Additional Notes: Pt reports Recently started on Dupixent immunotherapy. Started having a cough and other flu-like symptoms last week. No appts avail at [facility]. Pt specifilcally requesting VV due to immunosuppression. Appt scheduled with [facility] Reason for Disposition  [1] Known COPD or other severe lung disease (i.e., bronchiectasis, cystic fibrosis, lung surgery) AND [2] worsening symptoms (i.e., increased sputum purulence or amount, increased breathing difficulty  Answer Assessment - Initial Assessment Questions 1. ONSET: "When did the cough begin?"      1 week ago  2. SEVERITY: "How bad is the cough today?"      Getting worse at night  3. SPUTUM: "Describe the color of your sputum" (none, dry cough; clear, white, yellow, green)     Clear  4. HEMOPTYSIS: "Are you coughing up any blood?" If so ask: "How much?" (flecks, streaks, tablespoons, etc.)     No  5. DIFFICULTY BREATHING: "Are you having difficulty breathing?" If Yes, ask: "How bad is it?" (e.g., mild, moderate, severe)    - MILD: No SOB at rest, mild SOB with walking, speaks normally in sentences, can lie down, no retractions, pulse < 100.    - MODERATE: SOB at rest, SOB with minimal exertion and prefers to sit, cannot lie down flat, speaks in phrases, mild retractions, audible wheezing, pulse 100-120.     - SEVERE: Very SOB at rest, speaks in single words, struggling to breathe, sitting hunched forward, retractions, pulse > 120      Mild- SOB when coughing.   6. FEVER: "Do you have a fever?" If Yes, ask: "What is your temperature, how was it measured, and when did it start?"     Fevers last week; 101F  7. CARDIAC HISTORY: "Do you have any history of heart disease?" (e.g., heart attack, congestive heart failure)      Heart disease; pacemaker  8. LUNG HISTORY: "Do you have any history of lung disease?"  (e.g., pulmonary embolus, asthma, emphysema)     Lung cancer, ttaking immunotherapy, scar tissue in lungs from cancer  9. PE RISK FACTORS: "Do you have a history of blood clots?" (or: recent major surgery, recent prolonged travel, bedridden)     Years ago 10. OTHER SYMPTOMS: "Do you have any other symptoms?" (e.g., runny nose, wheezing, chest pain)       Headache, stuffy ears, body-ache, diarrhea, runny nose, intermittent nose bleeds  11. PREGNANCY: "Is there any chance you are pregnant?" "When was your last menstrual period?"       No  12. TRAVEL: "Have you traveled out of the country in the last month?" (e.g., travel history, exposures)       No  Protocols used: Cough - Acute Productive-A-AH

## 2023-04-08 NOTE — Telephone Encounter (Signed)
 Copied from CRM [id number]. Topic: Clinical - Medication Refill >> Apr 08, 2023  9:35 AM Crist Dominion wrote: Most Recent Primary Care Visit:  Provider: Maurie Southern  Department: [facility]  Visit Type: MEDICARE AWV, SEQUENTIAL  Date: 03/19/2023  Medication: clonazePAM  (KLONOPIN ) 1 MG tablet  Has the patient contacted their pharmacy? Yes but can't request through pharmacy. (Agent: If no, request that the patient contact the pharmacy for the refill. If patient does not wish to contact the pharmacy document the reason why and proceed with request.) (Agent: If yes, when and what did the pharmacy advise?)  Is this the correct pharmacy for this prescription? Yes If no, delete pharmacy and type the correct one.  This is the patient's preferred pharmacy:  [facility] [id number] - [place], [place] - [address] [name] [address] Phone: [phone] Fax: [phone]     Has the prescription been filled recently? Yes  Is the patient out of the medication? No  Has the patient been seen for an appointment in the last year OR does the patient have an upcoming appointment? Yes  Can we respond through MyChart? Yes  Agent: Please be advised that Rx refills may take up to 3 business days. We ask that you follow-up with your pharmacy.

## 2023-04-09 ENCOUNTER — Encounter: Payer: Self-pay | Admitting: Internal Medicine

## 2023-04-09 DIAGNOSIS — B9689 Other specified bacterial agents as the cause of diseases classified elsewhere: Secondary | ICD-10-CM | POA: Insufficient documentation

## 2023-04-09 NOTE — Assessment & Plan Note (Signed)
After loading dose of dupixent she has had remarkable improvement in itching and rash. She has delayed second dose due to illness and should be okay end of week to take this dose.

## 2023-04-09 NOTE — Assessment & Plan Note (Signed)
With flare today and refilled albuterol inhaler (almost out) and nebulizer solution (hers is expired). She will use these and is on oral steroids long term we will avoid change in dosing.

## 2023-04-09 NOTE — Assessment & Plan Note (Addendum)
Suspect post-flu bacterial bronchitis given worsening symptoms and some SOB and productive cough. She is immune compromised with dupixent and chronic oral steroids. Rx keflex and tessalon perles. She does have asthma complicating this course.

## 2023-04-09 NOTE — Telephone Encounter (Signed)
Pt was seen with Dr. Okey Dupre yesterday.

## 2023-04-28 ENCOUNTER — Other Ambulatory Visit: Payer: Self-pay | Admitting: Internal Medicine

## 2023-05-01 ENCOUNTER — Encounter: Payer: Self-pay | Admitting: Orthopaedic Surgery

## 2023-05-01 ENCOUNTER — Ambulatory Visit (INDEPENDENT_AMBULATORY_CARE_PROVIDER_SITE_OTHER): Payer: Medicare Other | Admitting: Orthopaedic Surgery

## 2023-05-01 DIAGNOSIS — M2170 Unequal limb length (acquired), unspecified site: Secondary | ICD-10-CM

## 2023-05-01 DIAGNOSIS — Z96641 Presence of right artificial hip joint: Secondary | ICD-10-CM | POA: Diagnosis not present

## 2023-05-01 NOTE — Progress Notes (Signed)
 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  She says the hip is doing well.  The biggest issue has been a leg length discrepancy with that right side longer than the left.  She still has no symptoms with her left hip.  She does wear a lift on the left side.  Both hips move smoothly and fluidly with no pain rotation.  His little bit of pain over the trochanteric areas.  Of note she does have Raynaud's disease involves both hands.  They are both very cool and discolored today and are classic for Raynaud's disease.  She will continue to increase her activities from my standpoint as she tolerates.  I would like to see her back in 6 months with a final standing AP pelvis at that visit.

## 2023-05-06 ENCOUNTER — Telehealth: Payer: Self-pay | Admitting: Internal Medicine

## 2023-05-06 NOTE — Telephone Encounter (Signed)
 Spoke with pt since starting Labetalol in Oct  pt does not feel like this  helping and is making Raynaud's worse Will forward to Dr Graciela Husbands for review and recommendations./cy

## 2023-05-06 NOTE — Telephone Encounter (Signed)
 Pt c/o medication issue:  1. Name of Medication: labetalol (NORMODYNE) 100 MG tablet   2. How are you currently taking this medication (dosage and times per day)?    3. Are you having a reaction (difficulty breathing--STAT)? no  4. What is your medication issue? Patient states the medication is not helping with her condition. Calling to see what other medication she can take. Please advise

## 2023-05-11 NOTE — Telephone Encounter (Signed)
 Will discuss when we see her in the office About maybe using an alpha blocker,  might be tricky with her BP issues, a good thing summer is coming :))

## 2023-05-27 NOTE — Telephone Encounter (Signed)
 Pt aware will discuss at next office visit (06-19-23 at 2 pm ) .[name]

## 2023-06-12 ENCOUNTER — Other Ambulatory Visit: Payer: Self-pay | Admitting: Family

## 2023-06-14 ENCOUNTER — Other Ambulatory Visit (HOSPITAL_BASED_OUTPATIENT_CLINIC_OR_DEPARTMENT_OTHER): Payer: Self-pay

## 2023-06-14 ENCOUNTER — Telehealth: Admitting: Physician Assistant

## 2023-06-14 ENCOUNTER — Ambulatory Visit: Payer: Self-pay

## 2023-06-14 DIAGNOSIS — B9689 Other specified bacterial agents as the cause of diseases classified elsewhere: Secondary | ICD-10-CM | POA: Diagnosis not present

## 2023-06-14 DIAGNOSIS — J069 Acute upper respiratory infection, unspecified: Secondary | ICD-10-CM | POA: Diagnosis not present

## 2023-06-14 MED ORDER — CEPHALEXIN 500 MG PO CAPS
500.0000 mg | ORAL_CAPSULE | Freq: Two times a day (BID) | ORAL | 0 refills | Status: DC
  Filled 2023-06-14: qty 14, 7d supply, fill #0

## 2023-06-14 MED ORDER — ONDANSETRON 4 MG PO TBDP
4.0000 mg | ORAL_TABLET | Freq: Three times a day (TID) | ORAL | 0 refills | Status: DC | PRN
  Filled 2023-06-14: qty 20, 4d supply, fill #0

## 2023-06-14 NOTE — Patient Instructions (Signed)
 Jeni L Bean, thank you for joining Angelia Kelp, PA-C for today's virtual visit.  While this provider is not your primary care provider (PCP), if your PCP is located in our provider database this encounter information will be shared with them immediately following your visit.   A [place] [facility] MyChart account gives you access to today's visit and all your visits, tests, and labs performed at [facility] " click here if you don't have a [facility] MyChart account or go to [email or web address]  Consent: (Patient) [name] provided verbal consent for this virtual visit at the beginning of the encounter.  Current Medications:  Current Outpatient Medications:    cephALEXin  (KEFLEX ) 500 MG capsule, Take 1 capsule (500 mg total) by mouth 2 (two) times daily., Disp: 14 capsule, Rfl: 0   ondansetron  (ZOFRAN -ODT) 4 MG disintegrating tablet, Take 1-2 tablets (4-8 mg total) by mouth every 8 (eight) hours as needed., Disp: 20 tablet, Rfl: 0   acetaminophen  (TYLENOL ) 500 MG tablet, Take 500 mg by mouth See admin instructions. Take 500 mg by mouth at bedtime and an additional 500 mg three times a day as needed for pain, Disp: , Rfl:    albuterol  (PROVENTIL ) (2.5 MG/3ML) 0.083% nebulizer solution, Take 3 mLs (2.5 mg total) by nebulization every 4 (four) hours as needed for wheezing or shortness of breath., Disp: 180 mL, Rfl: 1   albuterol  (VENTOLIN  HFA) 108 (90 Base) MCG/ACT inhaler, Inhale 2 puffs into the lungs every 4 (four) hours as needed for wheezing or shortness of breath., Disp: 1 each, Rfl: 0   Ascorbic Acid  (VITAMIN C ) 1000 MG tablet, Take 2,000 mg by mouth daily., Disp: , Rfl:    aspirin  81 MG chewable tablet, Chew 1 tablet (81 mg total) by mouth 2 (two) times daily., Disp: 35 tablet, Rfl: 0   augmented betamethasone  dipropionate (DIPROLENE ) 0.05 % ointment, Apply topically 2 (two) times daily. Limit to no longer than 2 weeks use. Not on face. (Patient taking  differently: Apply 1 Application topically 2 (two) times daily as needed (hives). Limit to no longer than 2 weeks use. Not on face.), Disp: 50 g, Rfl: 0   budesonide  (ENTOCORT EC ) 3 MG 24 hr capsule, Take 3 mg by mouth as needed for rash., Disp: , Rfl:    calcium  carbonate (TUMS EX) 750 MG chewable tablet, Chew 1 tablet by mouth daily., Disp: , Rfl:    clobetasol cream (TEMOVATE) 0.05 %, Apply 1 application topically 2 (two) times daily as needed (rash)., Disp: , Rfl:    clobetasol ointment (TEMOVATE) 0.05 %, Apply two times a day as needed, Disp: , Rfl:    clonazePAM  (KLONOPIN ) 1 MG tablet, TAKE ONE TABLET BY MOUTH 2-3 TIMES A DAY AS NEEDED, Disp: 70 tablet, Rfl: 0   cloNIDine  (CATAPRES ) 0.1 MG tablet, TAKE 1 TABLET BY MOUTH EVERY NIGHT AT BEDTIME, Disp: 90 tablet, Rfl: 2   cyanocobalamin  (VITAMIN B12) 1000 MCG/ML injection, INJECT 1ML INTRAMUSCULARLY EVERY 30 DAYS, Disp: 3 mL, Rfl: 4   Desoximetasone  (TOPICORT ) 0.25 % ointment, Apply 1 application topically 2 (two) times daily. (Patient taking differently: Apply 1 application  topically 2 (two) times daily as needed (rash).), Disp: 30 g, Rfl: 1   diphenhydrAMINE  (BENADRYL ) 25 MG tablet, Take 25-50 mg by mouth daily as needed for allergies., Disp: , Rfl:    Dupilumab (DUPIXENT) 300 MG/2ML SOAJ, Inject into the skin., Disp: , Rfl:    EPINEPHrine  0.3 mg/0.3 mL IJ SOAJ injection, Inject 0.3 mg  into the muscle as needed for anaphylaxis., Disp: , Rfl:    fexofenadine (ALLEGRA) 180 MG tablet, Take 180 mg by mouth daily as needed for allergies or rhinitis., Disp: , Rfl:    Fluocinonide 0.1 % CREA, Apply 1 application  topically in the morning and at bedtime. On affected areas, Disp: , Rfl:    fluticasone  (FLONASE ) 50 MCG/ACT nasal spray, Place 2 sprays into both nostrils daily. (Patient taking differently: Place 2 sprays into both nostrils daily as needed for allergies.), Disp: 16 g, Rfl: 0   furosemide  (LASIX ) 20 MG tablet, TAKE 1 TABLET BY MOUTH DAILY AS  NEEDED, Disp: 90 tablet, Rfl: 3   HYDROcodone -acetaminophen  (NORCO/VICODIN) 5-325 MG tablet, Take 1-2 tablets by mouth every 6 (six) hours as needed for moderate pain., Disp: 30 tablet, Rfl: 0   hydrocortisone  (ANUSOL -HC) 25 MG suppository, Place 25 mg rectally as needed for hemorrhoids., Disp: , Rfl:    ibuprofen  (ADVIL ) 800 MG tablet, Take 1 tablet (800 mg total) by mouth every 8 (eight) hours as needed., Disp: 60 tablet, Rfl: 3   labetalol  (NORMODYNE ) 100 MG tablet, Take 0.5 tablets (50 mg total) by mouth 2 (two) times daily., Disp: 90 tablet, Rfl: 3   levETIRAcetam  (KEPPRA ) 250 MG tablet, Take 250 mg by mouth See admin instructions. Take 250 mg by mouth at 8 AM, 2 PM, and 8 PM, Disp: , Rfl:    levocetirizine (XYZAL ) 5 MG tablet, TAKE 1 TABLET BY MOUTH EVERY DAY AS NEEDED, Disp: 30 tablet, Rfl: 0   lidocaine  (XYLOCAINE ) 2 % solution, 5mL swish and swallow as needed for sore throat; take with nystatin  susp, Disp: 200 mL, Rfl: 0   methocarbamol  (ROBAXIN ) 500 MG tablet, Take 1 tablet (500 mg total) by mouth every 6 (six) hours as needed for muscle spasms., Disp: 40 tablet, Rfl: 1   nystatin  (MYCOSTATIN ) 100000 UNIT/ML suspension, Use as directed 5 mLs (500,000 Units total) in the mouth or throat 3 (three) times daily as needed (thrush). (Patient taking differently: Use as directed 3 mLs in the mouth or throat 3 (three) times daily as needed (thrush).), Disp: 60 mL, Rfl: 2   Olopatadine  HCl (PATADAY ) 0.2 % SOLN, Use one drop in each eye once daily as needed. (Patient taking differently: Place 1 drop into both eyes daily as needed (itchy eyes).), Disp: 2.5 mL, Rfl: 5   polyethylene glycol (MIRALAX  / GLYCOLAX ) packet, Take 17 g by mouth at bedtime., Disp: , Rfl:    potassium chloride  SA (KLOR-CON  M) 20 MEQ tablet, Take 1 tablet (20 mEq total) by mouth 2 (two) times daily. (Patient taking differently: Take 20 mEq by mouth See admin instructions. Take 20 mEq by mouth at 8 AM and 2 PM), Disp: 180 tablet, Rfl:  2   Probiotic Product (PROBIOTIC PO), Take 2 capsules by mouth daily., Disp: , Rfl:    prochlorperazine  (COMPAZINE ) 10 MG tablet, Take 1 tablet (10 mg total) by mouth every 6 (six) hours as needed for nausea or vomiting., Disp: 60 tablet, Rfl: 0   sodium chloride  (OCEAN) 0.65 % SOLN nasal spray, Place 1 spray into both nostrils 2 (two) times daily., Disp: , Rfl:    Spacer/Aero-Holding Chambers (OPTICHAMBER DIAMOND) MISC, See admin instructions., Disp: , Rfl:    Syringe/Needle, Disp, (SYRINGE 3CC/27GX1-1/4") 27G X 1-1/4" 3 ML MISC, Inject 1 mL into the muscle every 30 (thirty) days. Vit B12, Disp: 12 each, Rfl: 0   tiZANidine  (ZANAFLEX ) 2 MG tablet, Take 1 tablet (2 mg total) by  mouth every 6 (six) hours as needed for muscle spasms., Disp: 30 tablet, Rfl: 0   Vitamin D , Ergocalciferol , (DRISDOL ) 50000 units CAPS capsule, TAKE 1 CAPSULE (50,000 UNITS TOTAL) BY MOUTH EVERY 7 (SEVEN) DAYS. (Patient taking differently: Take 50,000 Units by mouth every Saturday.), Disp: 4 capsule, Rfl: 11   Zinc  50 MG TABS, Take 50 mg by mouth daily., Disp: , Rfl:   Current Facility-Administered Medications:    mupirocin  ointment (BACTROBAN ) 2 %, , Topical, Daily, Arnie Lao, MD   Medications ordered in this encounter:  Meds ordered this encounter  Medications   ondansetron  (ZOFRAN -ODT) 4 MG disintegrating tablet    Sig: Take 1-2 tablets (4-8 mg total) by mouth every 8 (eight) hours as needed.    Dispense:  20 tablet    Refill:  0    Supervising Provider:   LAMPTEY, PHILIP O [[id number]]   cephALEXin  (KEFLEX ) 500 MG capsule    Sig: Take 1 capsule (500 mg total) by mouth 2 (two) times daily.    Dispense:  14 capsule    Refill:  0    Supervising Provider:   Corine Dice [[id number]]     *If you need refills on other medications prior to your next appointment, please contact your pharmacy*  Follow-Up: Call back or seek an in-person evaluation if the symptoms worsen or if the condition fails to  improve as anticipated.  [facility] [phone]  Other Instructions Upper Respiratory Infection, Adult An upper respiratory infection (URI) is a common viral infection of the nose, throat, and upper air passages that lead to the lungs. The most common type of URI is the common cold. URIs usually get better on their own, without medical treatment. What are the causes? A URI is caused by a virus. You may catch a virus by: Breathing in droplets from an infected person's cough or sneeze. Touching something that has been exposed to the virus (is contaminated) and then touching your mouth, nose, or eyes. What increases the risk? You are more likely to get a URI if: You are very young or very old. You have close contact with others, such as at work, school, or a health care facility. You smoke. You have long-term (chronic) heart or lung disease. You have a weakened disease-fighting system (immune system). You have nasal allergies or asthma. You are experiencing a lot of stress. You have poor nutrition. What are the signs or symptoms? A URI usually involves some of the following symptoms: Runny or stuffy (congested) nose. Cough. Sneezing. Sore throat. Headache. Fatigue. Fever. Loss of appetite. Pain in your forehead, behind your eyes, and over your cheekbones (sinus pain). Muscle aches. Redness or irritation of the eyes. Pressure in the ears or face. How is this diagnosed? This condition may be diagnosed based on your medical history and symptoms, and a physical exam. Your health care provider may use a swab to take a mucus sample from your nose (nasal swab). This sample can be tested to determine what virus is causing the illness. How is this treated? URIs usually get better on their own within 7-10 days. Medicines cannot cure URIs, but your health care provider may recommend certain medicines to help relieve symptoms, such as: Over-the-counter cold medicines. Cough  suppressants. Coughing is a type of defense against infection that helps to clear the respiratory system, so take these medicines only as recommended by your health care provider. Fever-reducing medicines. Follow these instructions at home: Activity Rest as needed.  If you have a fever, stay home from work or school until your fever is gone or until your health care provider says your URI cannot spread to other people (is no longer contagious). Your health care provider may have you wear a face mask to prevent your infection from spreading. Relieving symptoms Gargle with a mixture of salt and water 3-4 times a day or as needed. To make salt water, completely dissolve -1 tsp (3-6 g) of salt in 1 cup (237 mL) of warm water. Use a cool-mist humidifier to add moisture to the air. This can help you breathe more easily. Eating and drinking  Drink enough fluid to keep your urine pale yellow. Eat soups and other clear broths. General instructions  Take over-the-counter and prescription medicines only as told by your health care provider. These include cold medicines, fever reducers, and cough suppressants. Do not use any products that contain nicotine or tobacco. These products include cigarettes, chewing tobacco, and vaping devices, such as e-cigarettes. If you need help quitting, ask your health care provider. Stay away from secondhand smoke. Stay up to date on all immunizations, including the yearly (annual) flu vaccine. Keep all follow-up visits. This is important. How to prevent the spread of infection to others URIs can be contagious. To prevent the infection from spreading: Wash your hands with soap and water for at least 20 seconds. If soap and water are not available, use hand sanitizer. Avoid touching your mouth, face, eyes, or nose. Cough or sneeze into a tissue or your sleeve or elbow instead of into your hand or into the air.  Contact a health care provider if: You are getting worse  instead of better. You have a fever or chills. Your mucus is brown or red. You have yellow or brown discharge coming from your nose. You have pain in your face, especially when you bend forward. You have swollen neck glands. You have pain while swallowing. You have white areas in the back of your throat. Get help right away if: You have shortness of breath that gets worse. You have severe or persistent: Headache. Ear pain. Sinus pain. Chest pain. You have chronic lung disease along with any of the following: Making high-pitched whistling sounds when you breathe, most often when you breathe out (wheezing). Prolonged cough (more than 14 days). Coughing up blood. A change in your usual mucus. You have a stiff neck. You have changes in your: Vision. Hearing. Thinking. Mood. These symptoms may be an emergency. Get help right away. Call 911. Do not wait to see if the symptoms will go away. Do not drive yourself to the hospital. Summary An upper respiratory infection (URI) is a common infection of the nose, throat, and upper air passages that lead to the lungs. A URI is caused by a virus. URIs usually get better on their own within 7-10 days. Medicines cannot cure URIs, but your health care provider may recommend certain medicines to help relieve symptoms. This information is not intended to replace advice given to you by your health care provider. Make sure you discuss any questions you have with your health care provider. Document Revised: 09/14/2020 Document Reviewed: 09/14/2020 Elsevier Patient Education  2024 [employer]   If you have been instructed to have an in-person evaluation today at a local Urgent Care facility, please use the link below. It will take you to a list of all of our available [facility], including address, phone number and hours of operation. Please  do not delay care.  [facility]  If you or a family member do not have a  primary care provider, use the link below to schedule a visit and establish care. When you choose a [facility] primary care physician or advanced practice provider, you gain a long-term partner in health. Find a Primary Care Provider  Learn more about [facility]'s in-office and virtual care options: [facility] Get Care Now

## 2023-06-14 NOTE — Progress Notes (Signed)
 Virtual Visit Consent   [name], you are scheduled for a virtual visit with a [facility] provider today. Just as with appointments in the office, your consent must be obtained to participate. Your consent will be active for this visit and any virtual visit you may have with one of our providers in the next 365 days. If you have a MyChart account, a copy of this consent can be sent to you electronically.  As this is a virtual visit, video technology does not allow for your provider to perform a traditional examination. This may limit your provider's ability to fully assess your condition. If your provider identifies any concerns that need to be evaluated in person or the need to arrange testing (such as labs, EKG, etc.), we will make arrangements to do so. Although advances in technology are sophisticated, we cannot ensure that it will always work on either your end or our end. If the connection with a video visit is poor, the visit may have to be switched to a telephone visit. With either a video or telephone visit, we are not always able to ensure that we have a secure connection.  By engaging in this virtual visit, you consent to the provision of healthcare and authorize for your insurance to be billed (if applicable) for the services provided during this visit. Depending on your insurance coverage, you may receive a charge related to this service.  I need to obtain your verbal consent now. Are you willing to proceed with your visit today? [name] has provided verbal consent on 06/14/2023 for a virtual visit (video or telephone). Angelia Kelp, PA-C  Date: 06/14/2023 1:43 PM   Virtual Visit via Video Note   I, Angelia Kelp, connected with  [name]  ([id number], 1955/12/06) on 06/14/23 at  1:30 PM EDT by a video-enabled telemedicine application and verified that I am speaking with the correct person using two identifiers.  Location: Patient: Virtual Visit  Location Patient: Home Provider: Virtual Visit Location Provider: Home Office   I discussed the limitations of evaluation and management by telemedicine and the availability of in person appointments. The patient expressed understanding and agreed to proceed.    History of Present Illness: [name] is a 68 y.o. who identifies as a female who was assigned female at birth, and is being seen today for URI symptoms with scratchy.  HPI: URI  This is a new problem. The current episode started in the past 7 days. The problem has been gradually worsening. There has been no fever. Associated symptoms include congestion, coughing (very mild), diarrhea, ear pain (with swallowing; bilateral but R>L), headaches, nausea, rhinorrhea, a sore throat, swollen glands, vomiting and wheezing (feeling tightness). Pertinent negatives include no chest pain, plugged ear sensation or sinus pain. Associated symptoms comments: Hoarse and scratchy voice. She has tried inhaler use (albuterol , hot tea, salt water gargles) for the symptoms. The treatment provided no relief.   Pulse O2 has been between 95-98% At home Covid 19 testing is negative.   Problems:  Patient Active Problem List   Diagnosis Date Noted   Acute bacterial bronchitis 04/09/2023   Status post total replacement of right hip 05/18/2022   Avascular necrosis of bone of right hip (HCC) 04/19/2022   Moderate persistent asthma 12/08/2018   Perennial allergic rhinitis with predominantly nonallergic component 12/08/2018   Allergic conjunctivitis 12/08/2018   History of epistaxis 12/08/2018   Atopic dermatitis 12/08/2018   Recurrent urticaria 12/08/2018  Skeeter syndrome 12/08/2018   Skin lesion of chest wall 07/22/2018   S/P laparoscopic cholecystectomy 03/28/2018   Dehydration 05/18/2016   Type 2 diabetes mellitus without complication, without long-term current use of insulin  (HCC) 11/01/2015   History of pacemaker    POTS (postural orthostatic  tachycardia syndrome)    Iron deficiency anemia 11/11/2014   Chronic colitis 08/28/2013   Malignant melanoma, metastatic (HCC) 02/09/2013   Malignant melanoma (HCC) 12/01/2012   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   Hypertension 09/30/2012   Anxiety state 09/30/2012   Hypoxemia 09/30/2012   Disorder of brain 09/08/2012   Dysautonomia (HCC) 02/25/2011   Anticoagulant long-term use 01/19/2011   Neuritis 01/19/2011   Skin change 01/19/2011   Cerebral infarction (HCC)    Palpitation 06/28/2010   Pacemaker-Biotronik 06/28/2010   Dizziness and giddiness 04/27/2010   DIVERTICULOSIS, COLON, HX OF 01/19/2009   CAROTID SINUS SYNDROME 11/17/2008   OTHER MALAISE AND FATIGUE 11/17/2008   HYPERTENSION, BENIGN 11/02/2008   ATRIAL TACHYCARDIA 09/16/2008   SYNCOPE, HX OF 08/09/2008   INJURY UNSPEC NERVE SHOULDER GIRDLE&UPPER LIMB 07/12/2008   PERSONAL HISTORY OF MALIGNANT MELANOMA OF SKIN 07/12/2008   LUQ PAIN 12/08/2007   NECK PAIN 09/26/2007   NUMBNESS, ARM 09/26/2007   CPK, ABNORMAL 09/26/2007   ARM PAIN, LEFT 06/25/2007   SWELLING OF LIMB 06/25/2007   OSTEOPENIA 06/25/2007    Allergies:  Allergies  Allergen Reactions   Bee Venom Swelling   Doxycycline  Hives   Erythromycin Hives   Gadavist  [Gadobutrol ] Hives, Shortness Of Breath and Itching   Paxlovid  [Nirmatrelvir -Ritonavir ] Swelling and Other (See Comments)    Mouth swelling, mouth ulcers   Penicillins Hives    Did it involve swelling of the face/tongue/throat, SOB, or low BP? No Did it involve sudden or severe rash/hives, skin peeling, or any reaction on the inside of your mouth or nose? Yes Did you need to seek medical attention at a hospital or doctor's office? Yes When did it last happen?      5+ years If all above answers are "NO", may proceed with cephalosporin use.    Sulfa Antibiotics Hives   Vancomycin  Hives, Rash and Other (See Comments)    Develops "Red Man" Syndrome and welts/hives if it is run  too quickly via IV- must be run slowly   Preparation H [Pramox-Pe-Glycerin-Petrolatum] Hives    Cannot use  Cream or Suppositories    Phenergan  [Promethazine  Hcl] Other (See Comments)    Causes restless leg syndrome   Medications:  Current Outpatient Medications:    cephALEXin  (KEFLEX ) 500 MG capsule, Take 1 capsule (500 mg total) by mouth 2 (two) times daily., Disp: 14 capsule, Rfl: 0   ondansetron  (ZOFRAN -ODT) 4 MG disintegrating tablet, Take 1-2 tablets (4-8 mg total) by mouth every 8 (eight) hours as needed., Disp: 20 tablet, Rfl: 0   acetaminophen  (TYLENOL ) 500 MG tablet, Take 500 mg by mouth See admin instructions. Take 500 mg by mouth at bedtime and an additional 500 mg three times a day as needed for pain, Disp: , Rfl:    albuterol  (PROVENTIL ) (2.5 MG/3ML) 0.083% nebulizer solution, Take 3 mLs (2.5 mg total) by nebulization every 4 (four) hours as needed for wheezing or shortness of breath., Disp: 180 mL, Rfl: 1   albuterol  (VENTOLIN  HFA) 108 (90 Base) MCG/ACT inhaler, Inhale 2 puffs into the lungs every 4 (four) hours as needed for wheezing or shortness of breath., Disp: 1 each, Rfl: 0   Ascorbic Acid  (VITAMIN C ) 1000  MG tablet, Take 2,000 mg by mouth daily., Disp: , Rfl:    aspirin  81 MG chewable tablet, Chew 1 tablet (81 mg total) by mouth 2 (two) times daily., Disp: 35 tablet, Rfl: 0   augmented betamethasone  dipropionate (DIPROLENE ) 0.05 % ointment, Apply topically 2 (two) times daily. Limit to no longer than 2 weeks use. Not on face. (Patient taking differently: Apply 1 Application topically 2 (two) times daily as needed (hives). Limit to no longer than 2 weeks use. Not on face.), Disp: 50 g, Rfl: 0   budesonide  (ENTOCORT EC ) 3 MG 24 hr capsule, Take 3 mg by mouth as needed for rash., Disp: , Rfl:    calcium  carbonate (TUMS EX) 750 MG chewable tablet, Chew 1 tablet by mouth daily., Disp: , Rfl:    clobetasol cream (TEMOVATE) 0.05 %, Apply 1 application topically 2 (two) times daily  as needed (rash)., Disp: , Rfl:    clobetasol ointment (TEMOVATE) 0.05 %, Apply two times a day as needed, Disp: , Rfl:    clonazePAM  (KLONOPIN ) 1 MG tablet, TAKE ONE TABLET BY MOUTH 2-3 TIMES A DAY AS NEEDED, Disp: 70 tablet, Rfl: 0   cloNIDine  (CATAPRES ) 0.1 MG tablet, TAKE 1 TABLET BY MOUTH EVERY NIGHT AT BEDTIME, Disp: 90 tablet, Rfl: 2   cyanocobalamin  (VITAMIN B12) 1000 MCG/ML injection, INJECT 1ML INTRAMUSCULARLY EVERY 30 DAYS, Disp: 3 mL, Rfl: 4   Desoximetasone  (TOPICORT ) 0.25 % ointment, Apply 1 application topically 2 (two) times daily. (Patient taking differently: Apply 1 application  topically 2 (two) times daily as needed (rash).), Disp: 30 g, Rfl: 1   diphenhydrAMINE  (BENADRYL ) 25 MG tablet, Take 25-50 mg by mouth daily as needed for allergies., Disp: , Rfl:    Dupilumab (DUPIXENT) 300 MG/2ML SOAJ, Inject into the skin., Disp: , Rfl:    EPINEPHrine  0.3 mg/0.3 mL IJ SOAJ injection, Inject 0.3 mg into the muscle as needed for anaphylaxis., Disp: , Rfl:    fexofenadine (ALLEGRA) 180 MG tablet, Take 180 mg by mouth daily as needed for allergies or rhinitis., Disp: , Rfl:    Fluocinonide 0.1 % CREA, Apply 1 application  topically in the morning and at bedtime. On affected areas, Disp: , Rfl:    fluticasone  (FLONASE ) 50 MCG/ACT nasal spray, Place 2 sprays into both nostrils daily. (Patient taking differently: Place 2 sprays into both nostrils daily as needed for allergies.), Disp: 16 g, Rfl: 0   furosemide  (LASIX ) 20 MG tablet, TAKE 1 TABLET BY MOUTH DAILY AS NEEDED, Disp: 90 tablet, Rfl: 3   HYDROcodone -acetaminophen  (NORCO/VICODIN) 5-325 MG tablet, Take 1-2 tablets by mouth every 6 (six) hours as needed for moderate pain., Disp: 30 tablet, Rfl: 0   hydrocortisone  (ANUSOL -HC) 25 MG suppository, Place 25 mg rectally as needed for hemorrhoids., Disp: , Rfl:    ibuprofen  (ADVIL ) 800 MG tablet, Take 1 tablet (800 mg total) by mouth every 8 (eight) hours as needed., Disp: 60 tablet, Rfl: 3    labetalol  (NORMODYNE ) 100 MG tablet, Take 0.5 tablets (50 mg total) by mouth 2 (two) times daily., Disp: 90 tablet, Rfl: 3   levETIRAcetam  (KEPPRA ) 250 MG tablet, Take 250 mg by mouth See admin instructions. Take 250 mg by mouth at 8 AM, 2 PM, and 8 PM, Disp: , Rfl:    levocetirizine (XYZAL ) 5 MG tablet, TAKE 1 TABLET BY MOUTH EVERY DAY AS NEEDED, Disp: 30 tablet, Rfl: 0   lidocaine  (XYLOCAINE ) 2 % solution, 5mL swish and swallow as needed for sore throat; take  with nystatin  susp, Disp: 200 mL, Rfl: 0   methocarbamol  (ROBAXIN ) 500 MG tablet, Take 1 tablet (500 mg total) by mouth every 6 (six) hours as needed for muscle spasms., Disp: 40 tablet, Rfl: 1   nystatin  (MYCOSTATIN ) 100000 UNIT/ML suspension, Use as directed 5 mLs (500,000 Units total) in the mouth or throat 3 (three) times daily as needed (thrush). (Patient taking differently: Use as directed 3 mLs in the mouth or throat 3 (three) times daily as needed (thrush).), Disp: 60 mL, Rfl: 2   Olopatadine  HCl (PATADAY ) 0.2 % SOLN, Use one drop in each eye once daily as needed. (Patient taking differently: Place 1 drop into both eyes daily as needed (itchy eyes).), Disp: 2.5 mL, Rfl: 5   polyethylene glycol (MIRALAX  / GLYCOLAX ) packet, Take 17 g by mouth at bedtime., Disp: , Rfl:    potassium chloride  SA (KLOR-CON  M) 20 MEQ tablet, Take 1 tablet (20 mEq total) by mouth 2 (two) times daily. (Patient taking differently: Take 20 mEq by mouth See admin instructions. Take 20 mEq by mouth at 8 AM and 2 PM), Disp: 180 tablet, Rfl: 2   Probiotic Product (PROBIOTIC PO), Take 2 capsules by mouth daily., Disp: , Rfl:    prochlorperazine  (COMPAZINE ) 10 MG tablet, Take 1 tablet (10 mg total) by mouth every 6 (six) hours as needed for nausea or vomiting., Disp: 60 tablet, Rfl: 0   sodium chloride  (OCEAN) 0.65 % SOLN nasal spray, Place 1 spray into both nostrils 2 (two) times daily., Disp: , Rfl:    Spacer/Aero-Holding Chambers (OPTICHAMBER DIAMOND) MISC, See admin  instructions., Disp: , Rfl:    Syringe/Needle, Disp, (SYRINGE 3CC/27GX1-1/4") 27G X 1-1/4" 3 ML MISC, Inject 1 mL into the muscle every 30 (thirty) days. Vit B12, Disp: 12 each, Rfl: 0   tiZANidine  (ZANAFLEX ) 2 MG tablet, Take 1 tablet (2 mg total) by mouth every 6 (six) hours as needed for muscle spasms., Disp: 30 tablet, Rfl: 0   Vitamin D , Ergocalciferol , (DRISDOL ) 50000 units CAPS capsule, TAKE 1 CAPSULE (50,000 UNITS TOTAL) BY MOUTH EVERY 7 (SEVEN) DAYS. (Patient taking differently: Take 50,000 Units by mouth every Saturday.), Disp: 4 capsule, Rfl: 11   Zinc  50 MG TABS, Take 50 mg by mouth daily., Disp: , Rfl:   Current Facility-Administered Medications:    mupirocin  ointment (BACTROBAN ) 2 %, , Topical, Daily, Arnie Lao, MD  Observations/Objective: Patient is well-developed, well-nourished in no acute distress.  Resting comfortably at home.  Head is normocephalic, atraumatic.  No labored breathing.  Speech is clear and coherent with logical content.  Patient is alert and oriented at baseline.    Assessment and Plan: 1. Bacterial URI (Primary) - ondansetron  (ZOFRAN -ODT) 4 MG disintegrating tablet; Take 1-2 tablets (4-8 mg total) by mouth every 8 (eight) hours as needed.  Dispense: 20 tablet; Refill: 0 - cephALEXin  (KEFLEX ) 500 MG capsule; Take 1 capsule (500 mg total) by mouth 2 (two) times daily.  Dispense: 14 capsule; Refill: 0  - Worsening despite OTC medications - Will treat with Keflex  (tolerated well previously) - Can continue Mucinex (PLAIN) - Has Tessalon  perles on hand at home if needed - Continue Albuterol  inhaler as needed - Zofran  added for GI symptoms - Push fluids.  - Rest.  - Steam and humidifier can help - Seek in person evaluation if worsening or symptoms fail to improve    Follow Up Instructions: I discussed the assessment and treatment plan with the patient. The patient was provided an opportunity to  ask questions and all were answered. The  patient agreed with the plan and demonstrated an understanding of the instructions.  A copy of instructions were sent to the patient via MyChart unless otherwise noted below.    The patient was advised to call back or seek an in-person evaluation if the symptoms worsen or if the condition fails to improve as anticipated.    Angelia Kelp, PA-C

## 2023-06-14 NOTE — Telephone Encounter (Signed)
  Chief Complaint: flu like symptoms/vomiting/diarrhea Symptoms: vomiting, abdominal pain, diarrhea, fatigue, sore throat Frequency: started yesterday Pertinent Negatives: Patient denies fever Disposition: [] ED /[] Urgent Care (no appt availability in office) / [] Appointment(In office/virtual)/ [x]  [facility]/ [] Home Care/ [] Refused Recommended Disposition /[] [place] [facility]/ []  Follow-up with PCP Additional Notes: patient calling in with complaints of flu like symptoms/vomiting/abdominal pain/diarrhea and fatigue. Patient states symptoms started yesterday afternoon. Patient states she generally doesn't feel well. Asking for antibiotic to be called in. Patient is recommended to be a provider within 4 hours. Patient set up with virtual UC appointment for today at 1:30 PM. Patient verbalized understanding and all questions answered. Patient is also requesting med refills on medications she had requested in the last few days.    Copied from CRM [id number]. Topic: Clinical - Red Word Triage >> Jun 14, 2023  9:52 AM Charolett L wrote: [facility] that prompted transfer to Nurse Triage: Ears ache, stomach ache, vomiting, diarrhea, fatigue cancer patient Reason for Disposition  [1] MILD-MODERATE pain AND [2] constant AND [3] present > 2 hours  Answer Assessment - Initial Assessment Questions 1. LOCATION: Where does it hurt?      Middle abdominal pain 2. RADIATION: Does the pain shoot anywhere else? (e.g., chest, back)     No radiation 3. ONSET: When did the pain begin? (e.g., minutes, hours or days ago)      Mid day yesterday 4. SUDDEN: Gradual or sudden onset?     Sudden onset 5. PATTERN Does the pain come and go, or is it constant?    - If it comes and goes: How long does it last? Do you have pain now?     (Note: Comes and goes means the pain is intermittent. It goes away completely between bouts.)    - If constant: Is it getting better, staying the same, or getting  worse?      (Note: Constant means the pain never goes away completely; most serious pain is constant and gets worse.)      constant 6. SEVERITY: How bad is the pain?  (e.g., Scale 1-10; mild, moderate, or severe)    - MILD (1-3): Doesn't interfere with normal activities, abdomen soft and not tender to touch.     - MODERATE (4-7): Interferes with normal activities or awakens from sleep, abdomen tender to touch.     - SEVERE (8-10): Excruciating pain, doubled over, unable to do any normal activities.       4 out of 10 7. RECURRENT SYMPTOM: Have you ever had this type of stomach pain before? If Yes, ask: When was the last time? and What happened that time?      no 8. CAUSE: What do you think is causing the stomach pain?     unsure 9. RELIEVING/AGGRAVATING FACTORS: What makes it better or worse? (e.g., antacids, bending or twisting motion, bowel movement)     no 10. OTHER SYMPTOMS: Do you have any other symptoms? (e.g., back pain, diarrhea, fever, urination pain, vomiting)       Sore throat, flu like symptoms  Protocols used: Abdominal Pain - Female-A-AH

## 2023-06-17 ENCOUNTER — Telehealth: Payer: Self-pay

## 2023-06-17 ENCOUNTER — Other Ambulatory Visit: Payer: Self-pay

## 2023-06-17 ENCOUNTER — Other Ambulatory Visit: Payer: Self-pay | Admitting: Family Medicine

## 2023-06-17 ENCOUNTER — Ambulatory Visit: Payer: Self-pay

## 2023-06-17 ENCOUNTER — Other Ambulatory Visit: Payer: Self-pay | Admitting: Internal Medicine

## 2023-06-17 DIAGNOSIS — F419 Anxiety disorder, unspecified: Secondary | ICD-10-CM

## 2023-06-17 MED ORDER — CLONAZEPAM 1 MG PO TABS: ORAL_TABLET | ORAL | 0 refills | Status: DC

## 2023-06-17 NOTE — Telephone Encounter (Signed)
 Copied from CRM [id number]. Topic: Clinical - Medication Question >> Jun 17, 2023  8:31 AM Zipporah Him wrote: Reason for CRM: Patient would like to know if it would be possible to get a 90 prescription of the clonazepam  for her convenience since it is a delayed fill every month.

## 2023-06-17 NOTE — Telephone Encounter (Signed)
 Copied from CRM [id number]. Topic: Clinical - Medication Refill >> Jun 17, 2023  8:26 AM Zipporah Him wrote: Most Recent Primary Care Visit:  Provider: Bambi Lever A  Department: [facility]  Visit Type: MYCHART VIDEO VISIT  Date: 04/08/2023  Medication: clonazePAM  (KLONOPIN ) 1 MG tablet, potassium chloride  SA (KLOR-CON  M) 20 MEQ tablet  Has the patient contacted their pharmacy? Yes (Agent: If no, request that the patient contact the pharmacy for the refill. If patient does not wish to contact the pharmacy document the reason why and proceed with request.) (Agent: If yes, when and what did the pharmacy advise?)  Is this the correct pharmacy for this prescription? Yes If no, delete pharmacy and type the correct one.  This is the patient's preferred pharmacy:  [id number] - [place], [place] - [address] [name] [address] Phone: [phone] Fax: [phone]    Has the prescription been filled recently? No  Is the patient out of the medication? Yes, completely out  Has the patient been seen for an appointment in the last year OR does the patient have an upcoming appointment? No  Can we respond through MyChart? No, phone  Agent: Please be advised that Rx refills may take up to 3 business days. We ask that you follow-up with your pharmacy.

## 2023-06-17 NOTE — Telephone Encounter (Signed)
 Copied from CRM [id number]. Topic: General - Other >> Jun 17, 2023  8:33 AM [name] wrote: Reason for CRM: Patient states she has an appointment with Dr Rodolfo Clan on Wednesday and he is about to retire, they have been talking about the patients pacemaker and they may have to replace before he retires. Just wanted to let Dr Ethel Henry know.

## 2023-06-17 NOTE — Telephone Encounter (Signed)
 Chief Complaint: medication refill concerns Symptoms: medication refill concerns Frequency: x 4 days Disposition: [] ED /[] Urgent Care (no appt availability in office) / [] Appointment(In office/virtual)/ []  [place] [facility]/ [] Home Care/ [] Refused Recommended Disposition /[] [place] [facility]/ [x]  Follow-up with PCP Additional Notes: pt states that she has been out of her medication since last Thursday. States that she called pharmacy on last Thursday and the pharmacy put in a request for the medication but she has not received the medication. Pt states that she is concerned about her anxiety because if she has an increase in her anxiety then she could have a seizure. Pt states that she is not happy with the way medications are being processed. Called [facility] and confrimed that pt medication request have been received and till be handles once PCP is back in office tomorrow.   Copied from CRM [id number]. Topic: General - Other >> Jun 17, 2023  1:58 PM Tiffany S wrote: Reason for CRM: Patient is concerned about being off medication for 5 days now I advised patient refills can take up to 3 business days to complete patient is requesting call back Reason for Disposition  [1] Caller has URGENT medicine question about med that PCP or specialist prescribed AND [2] triager unable to answer question  Answer Assessment - Initial Assessment Questions 1. NAME of MEDICINE: "What medicine(s) are you calling about?"     Klonopin  and potassium 2. QUESTION: "What is your question?" (e.g., double dose of medicine, side effect)     Out of medication 3. PRESCRIBER: "Who prescribed the medicine?" Reason: if prescribed by specialist, call should be referred to that group.     panosh 4. SYMPTOMS: "Do you have any symptoms?" If Yes, ask: "What symptoms are you having?"  "How bad are the symptoms (e.g., mild, moderate, severe)     Started to experience mild anxiousness  Protocols used: Medication Question  Call-A-AH

## 2023-06-17 NOTE — Telephone Encounter (Signed)
 Duplicate encounter, please see refill dated today. Closing encounter.  Copied from CRM [id number]. Topic: Clinical - Medication Refill >> Jun 17, 2023  8:26 AM Zipporah Him wrote: Most Recent Primary Care Visit:  Provider: Bambi Lever A  Department: [facility]  Visit Type: MYCHART VIDEO VISIT  Date: 04/08/2023  Medication: clonazePAM  (KLONOPIN ) 1 MG tablet, potassium chloride  SA (KLOR-CON  M) 20 MEQ tablet  Has the patient contacted their pharmacy? Yes (Agent: If no, request that the patient contact the pharmacy for the refill. If patient does not wish to contact the pharmacy document the reason why and proceed with request.) (Agent: If yes, when and what did the pharmacy advise?)  Is this the correct pharmacy for this prescription? Yes If no, delete pharmacy and type the correct one.  This is the patient's preferred pharmacy:  [facility] [id number] - [place], [place] - [address] [name] [address] Phone: [phone] Fax: [phone]    Has the prescription been filled recently? No  Is the patient out of the medication? Yes, completely out  Has the patient been seen for an appointment in the last year OR does the patient have an upcoming appointment? No  Can we respond through MyChart? No, phone  Agent: Please be advised that Rx refills may take up to 3 business days. We ask that you follow-up with your pharmacy.

## 2023-06-17 NOTE — Telephone Encounter (Signed)
 Confusing scenario  I had agreed to take over the clonazepam  from her specialists care a while back and ok to refill when I am aware  but since CS there are some extra guardrails /and updated med history . Usually want every 6 mos med check if regular use .    The potassium appears to be managed by cardiology team most recently but others  and not primary care since that rx is  related to diuretic  management . But dont want her to run out. Appears  Ddr Lucienne Ryder wrote last rx  after DR Rodolfo Clan.  Last potassium in system was ok 5 mos ok per [facility] and I have never  ordered .   We needappt to sort out  med check ( can be virtual)    Referring this request to Cards team   for  rx

## 2023-06-17 NOTE — Progress Notes (Signed)
 Refilling medication since provider is not in office. PDMP reviewed and last refilled on 04/08/2023. Recommend patient make a chronic management appointment with Dr. Daphine Eagle.

## 2023-06-17 NOTE — Telephone Encounter (Signed)
 Pt is calling checking on the status of med

## 2023-06-18 ENCOUNTER — Ambulatory Visit: Payer: Self-pay

## 2023-06-18 ENCOUNTER — Telehealth: Payer: Self-pay

## 2023-06-18 NOTE — Telephone Encounter (Signed)
 Copied from CRM [id number]. Topic: General - Other >> Jun 18, 2023  5:01 PM Tiffany S wrote: Reason for CRM: Patient requesting call back  Regarding Potassium please follow up with patient

## 2023-06-18 NOTE — Telephone Encounter (Signed)
 Attempted to reach pt. Left a detail message about her Rx for potassium. And to call us  back when received this message.

## 2023-06-18 NOTE — Telephone Encounter (Signed)
 Attempted to contact pt. Please see other phone encounter.

## 2023-06-18 NOTE — Telephone Encounter (Signed)
 Copied from CRM [id number]. Topic: Clinical - Prescription Issue >> Jun 18, 2023 11:32 AM Albertha Alosa wrote: Reason for CRM: Patient called in wanting to speak with someone in regards to the prescriptions medication refill flow, patient stated she has to wait for so long for her medication and would like to speak to someone on what to do if it to be sent more time efficient

## 2023-06-18 NOTE — Telephone Encounter (Signed)
 Copied From CRM [id number]. Reason for Triage: patient has been waiting for potassium chloride  SA (KLOR-CON  M) 20 MEQ tablet  - having issues with leg cramps- wants call from nurse [phone]  Pt states she needs Potassium Chloride  refilled. Pt states she is unsure who refilled the last potassium rx, but states that it has been within the last 3 months. Per Epic, cardiology refilled 11/2021. Pt is upset that she does not feel as though she has been kept "in the loop" with her medicine.  Chief Complaint: leg cramps, K+ refill Symptoms: leg cramps during the night Frequency: chronic, intermittent Pertinent Negatives: Patient denies SOB, CP,  Disposition: [] ED /[] Urgent Care (no appt availability in office) / [] Appointment(In office/virtual)/ []  [place] [facility]/ [] Home Care/ [] Refused Recommended Disposition /[] Day Mobile Bus/ [x]  Follow-up with PCP Additional Notes: Pt calling about med refill. Pt states that she needs her K+ refilled. Pt states that she had cramps in her thighs last night, does not have the pain at this time. Pt states that "I need those medications, I am on the low end of normal and then I had diarrhea and vomiting over the weekend". Pt inquired about OTC forms of potassium, pt advised that she should speak to her provider about adding OTC medication like potassium to her regiment. Pt states she understands. RN educated pt on lab values and impacts of low/high levels. RN advised pt that staff would reach out to pt tomorrow, and advised not to cancel Cardiology visit tomorrow. Pt agreeable. Pt has PCP appt on 5/1.  Reason for Disposition  Leg pain or muscle cramp is a chronic symptom (recurrent or ongoing AND present > 4 weeks)  Answer Assessment - Initial Assessment Questions 1. ONSET: "When did the pain start?"      Ongoing/chronic, most recent was last night 2. LOCATION: "Where is the pain located?"      Bilateral thigh cramps 3. PAIN: "How bad is the pain?"     (Scale 1-10; or mild, moderate, severe)   -  MILD (1-3): doesn't interfere with normal activities    -  MODERATE (4-7): interferes with normal activities (e.g., work or school) or awakens from sleep, limping    -  SEVERE (8-10): excruciating pain, unable to do any normal activities, unable to walk     When it occurs-10 4. WORK OR EXERCISE: "Has there been any recent work or exercise that involved this part of the body?"      denies 5. CAUSE: "What do you think is causing the leg pain?"     Low potassium 6. OTHER SYMPTOMS: "Do you have any other symptoms?" (e.g., chest pain, back pain, breathing difficulty, swelling, rash, fever, numbness, weakness)     denies  Protocols used: Leg Pain-A-AH

## 2023-06-19 ENCOUNTER — Telehealth: Payer: Self-pay

## 2023-06-19 ENCOUNTER — Encounter: Payer: Self-pay | Admitting: Internal Medicine

## 2023-06-19 ENCOUNTER — Ambulatory Visit: Payer: Medicare Other | Attending: Internal Medicine | Admitting: Internal Medicine

## 2023-06-19 VITALS — BP 130/82 | HR 75 | Ht 63.0 in | Wt 148.2 lb

## 2023-06-19 DIAGNOSIS — I495 Sick sinus syndrome: Secondary | ICD-10-CM | POA: Diagnosis not present

## 2023-06-19 DIAGNOSIS — Z79899 Other long term (current) drug therapy: Secondary | ICD-10-CM | POA: Insufficient documentation

## 2023-06-19 DIAGNOSIS — I4719 Other supraventricular tachycardia: Secondary | ICD-10-CM | POA: Diagnosis not present

## 2023-06-19 NOTE — Telephone Encounter (Signed)
 Copied from CRM [id number]. Topic: General - Other >> Jun 18, 2023  5:01 PM Tiffany S wrote: Reason for CRM: Patient requesting call back  Regarding Potassium please follow up with patient >> Jun 18, 2023  5:31 PM Bridgette Campus T wrote: Patient calling about follow up for potassium chloride  SA (KLOR-CON  M) 20 MEQ tablet refill

## 2023-06-19 NOTE — Progress Notes (Signed)
 Patient Care Team: Reginal Capra, MD as PCP - General Verona Goodwill, MD as PCP - Electrophysiology (Cardiology) Elna Haggis, MD as Consulting Physician (Neurosurgery) Sumner Ends, MD as Consulting Physician (Oncology) Sharleen Dawley, MD as Referring Physician (Internal Medicine) Lenton Rail, MD as Consulting Physician (Otolaryngology) Drusilla Gerlach, MD as Consulting Physician (Dermatology) Seabron Cypress, Timmothy Foots, MD as Consulting Physician (Allergy  and Immunology) Carnell Christian, [facility] (Pharmacist)   HPI  [name] is a 68 y.o. female Seen in follow-up for dysautonomia as well as pacemaker-Biotronik implanted for dysautonomia and bradycardia for device interrogation Hx of .  She was seen a few weeks ago at the time of her MRI  The patient denies chest pain , shortness of breath, nocturnal dyspnea, orthopnea or peripheral edema.  There have been no syncope.    Complains of cold hands and feet.  Arterial Dopplers of upper lower extremities were normal; perhaps aggravated by cold.  Difficult warm up.  Somewhat painful.  Status post hip arthroplasty 3/24 complicated by trochanteric bursitis and IT band syndrome Intercurrently diagnosed with lymphedema and undergoing leg wrapping, hypothesized because of a history of prior lymph node resection and asymmetric edema right greater than left.  Then developed bilateral edema still right greater than left, recalling that her surgery was on the right.  She saw her PCP and received 3 doses of furosemide  (20 mg) with brisk diuresis relief of swelling but complicated by what she thought was hypomagnesemia as she had cramping and it had this before  Venous Doppler was negative  Has been struggling with Raynaud's  She continues to struggle with the swelling of unclear cause.  Also is struggling with trying to get her potassium refilled which she has been on for a decade for potassium levels that were low a decade ago.     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    DATE TEST EF   4/22 Echo   60-65 %   7/24 Chest CT    No thoracic mets Not gated for PE   Date Cr K Hgb  3/24 0.58 3.9 12.1           Past Medical History:  Diagnosis Date   Anemia    Angio-edema    Anxiety    Arthritis    Asthma    Atrial fibrillation (HCC)    Atrial tachycardia (HCC)    Autonomic dysfunction    Complication of anesthesia    pt states wakes up with "shakes"   CVA (cerebral infarction)    2012 with dizziness and vision change felt embolic from atrial tachy   Disorder of bone and cartilage, unspecified    Diverticulosis    DM (diabetes mellitus) (HCC)    DVT (deep venous thrombosis) (HCC)    Eczema    Family history of anesthesia complication    PONV and " shaking "   Fatty liver    GERD (gastroesophageal reflux disease)    History of hiatal hernia    History of radiation therapy 10/24/2012   brain   HLD (hyperlipidemia)    HTN (hypertension)    Hypoglycemia, unspecified    Liver metastasis    Lung metastasis    Metastasis to brain (HCC)    Osteopenia    Other nonspecific abnormal serum enzyme levels    Other specified congenital anomalies of nervous system    Pacemaker  autonomic dysfunction   Pancreatitis    from therapy   Peripheral vascular disease (HCC)    POTS (postural orthostatic tachycardia syndrome)    Raynaud's disease    due to chemo   Skin cancer    Hx: of lung lesion    Past Surgical History:  Procedure Laterality Date   ABLATION SAPHENOUS VEIN W/ RFA     2002 x3   CARPAL TUNNEL RELEASE Left 2000   CHOLECYSTECTOMY N/A 03/28/2018   Procedure: LAPAROSCOPIC CHOLECYSTECTOMY WITH INTRAOPERATIVE CHOLANGIOGRAM;  Surgeon: Jacolyn Matar, MD;  Location: [facility] OR;  Service: General;  Laterality: N/A;   CRANIOTOMY N/A 09/30/2012   suboccipital craniectomy   CRANIOTOMY Left 02/09/2013   Procedure: LEFT  PARIETAL CRANIOTOMY with stealth;  Surgeon: Elna Haggis, MD;  Location: [facility] OR[facility];  Service: Neurosurgery;  Laterality: Left;  LEFT Parietal Craniotomy for tumor with stealth   DEEP AXILLARY SENTINEL NODE BIOPSY / EXCISION     due to extensive Melanoma-right arm   fatty tumor removed  2000   from chest   INSERT / REPLACE / REMOVE PACEMAKER  2012   1999, x 3   LAPAROSCOPY  09/06/2011   Procedure: LAPAROSCOPY OPERATIVE;  Surgeon: Loetta Ringer A. Johnston Nao, MD;  Location: [facility] ORS;  Service: Gynecology;  Laterality: Left;  with Left Ovarian Cystectomy    MELANOMA EXCISION     with removal of lymph nodes, left shoulder   NOSE SURGERY  2010, 2012   for nose bleeds x 2   SINOSCOPY     TONSILLECTOMY AND ADENOIDECTOMY     TOTAL HIP ARTHROPLASTY Right 05/18/2022   Procedure: RIGHT TOTAL HIP ARTHROPLASTY ANTERIOR APPROACH;  Surgeon: Arnie Lao, MD;  Location: [facility] ORS;  Service: Orthopedics;  Laterality: Right;    Current Outpatient Medications  Medication Sig Dispense Refill   acetaminophen  (TYLENOL ) 500 MG tablet Take 500 mg by mouth See admin instructions. Take 500 mg by mouth at bedtime and an additional 500 mg three times a day as needed for pain     albuterol  (PROVENTIL ) (2.5 MG/3ML) 0.083% nebulizer solution Take 3 mLs (2.5 mg total) by nebulization every 4 (four) hours as needed for wheezing or shortness of breath. 180 mL 1   albuterol  (VENTOLIN  HFA) 108 (90 Base) MCG/ACT inhaler Inhale 2 puffs into the lungs every 4 (four) hours as needed for wheezing or shortness of breath. 1 each 0   Ascorbic Acid  (VITAMIN C ) 1000 MG tablet Take 2,000 mg by mouth daily.     aspirin  81 MG chewable tablet Chew 1 tablet (81 mg total) by mouth 2 (two) times daily. 35 tablet 0   augmented betamethasone  dipropionate (DIPROLENE ) 0.05 % ointment Apply topically 2 (two) times daily. Limit to no longer than 2 weeks use. Not on face. (Patient taking differently: Apply 1 Application topically 2 (two) times daily  as needed (hives). Limit to no longer than 2 weeks use. Not on face.) 50 g 0   budesonide  (ENTOCORT EC ) 3 MG 24 hr capsule Take 3 mg by mouth as needed for rash.     calcium  carbonate (TUMS EX) 750 MG chewable tablet Chew 1 tablet by mouth daily.     cephALEXin  (KEFLEX ) 500 MG capsule Take 1 capsule (500 mg total) by mouth 2 (two) times daily. 14 capsule 0   clobetasol cream (TEMOVATE) 0.05 % Apply 1 application topically 2 (two) times daily as needed (rash).     clobetasol ointment (TEMOVATE) 0.05 % Apply two times a day as needed  clonazePAM  (KLONOPIN ) 1 MG tablet TAKE ONE TABLET BY MOUTH 2-3 TIMES A DAY AS NEEDED 70 tablet 0   cloNIDine  (CATAPRES ) 0.1 MG tablet TAKE 1 TABLET BY MOUTH EVERY NIGHT AT BEDTIME 90 tablet 2   cyanocobalamin  (VITAMIN B12) 1000 MCG/ML injection INJECT 1ML INTRAMUSCULARLY EVERY 30 DAYS 3 mL 4   Desoximetasone  (TOPICORT ) 0.25 % ointment Apply 1 application topically 2 (two) times daily. (Patient taking differently: Apply 1 application  topically 2 (two) times daily as needed (rash).) 30 g 1   diphenhydrAMINE  (BENADRYL ) 25 MG tablet Take 25-50 mg by mouth daily as needed for allergies.     Dupilumab (DUPIXENT) 300 MG/2ML SOAJ Inject into the skin.     EPINEPHrine  0.3 mg/0.3 mL IJ SOAJ injection Inject 0.3 mg into the muscle as needed for anaphylaxis.     fexofenadine (ALLEGRA) 180 MG tablet Take 180 mg by mouth daily as needed for allergies or rhinitis.     Fluocinonide 0.1 % CREA Apply 1 application  topically in the morning and at bedtime. On affected areas     fluticasone  (FLONASE ) 50 MCG/ACT nasal spray Place 2 sprays into both nostrils daily. (Patient taking differently: Place 2 sprays into both nostrils daily as needed for allergies.) 16 g 0   furosemide  (LASIX ) 20 MG tablet TAKE 1 TABLET BY MOUTH DAILY AS NEEDED 90 tablet 3   HYDROcodone -acetaminophen  (NORCO/VICODIN) 5-325 MG tablet Take 1-2 tablets by mouth every 6 (six) hours as needed for moderate pain. 30  tablet 0   hydrocortisone  (ANUSOL -HC) 25 MG suppository Place 25 mg rectally as needed for hemorrhoids.     ibuprofen  (ADVIL ) 800 MG tablet Take 1 tablet (800 mg total) by mouth every 8 (eight) hours as needed. 60 tablet 3   labetalol  (NORMODYNE ) 100 MG tablet Take 0.5 tablets (50 mg total) by mouth 2 (two) times daily. 90 tablet 3   levETIRAcetam  (KEPPRA ) 250 MG tablet Take 250 mg by mouth See admin instructions. Take 250 mg by mouth at 8 AM, 2 PM, and 8 PM     levocetirizine (XYZAL ) 5 MG tablet TAKE 1 TABLET BY MOUTH EVERY DAY AS NEEDED 30 tablet 0   lidocaine  (XYLOCAINE ) 2 % solution 5mL swish and swallow as needed for sore throat; take with nystatin  susp 200 mL 0   methocarbamol  (ROBAXIN ) 500 MG tablet Take 1 tablet (500 mg total) by mouth every 6 (six) hours as needed for muscle spasms. 40 tablet 1   nystatin  (MYCOSTATIN ) 100000 UNIT/ML suspension Use as directed 5 mLs (500,000 Units total) in the mouth or throat 3 (three) times daily as needed (thrush). (Patient taking differently: Use as directed 3 mLs in the mouth or throat 3 (three) times daily as needed (thrush).) 60 mL 2   Olopatadine  HCl (PATADAY ) 0.2 % SOLN Use one drop in each eye once daily as needed. (Patient taking differently: Place 1 drop into both eyes daily as needed (itchy eyes).) 2.5 mL 5   ondansetron  (ZOFRAN -ODT) 4 MG disintegrating tablet Take 1-2 tablets (4-8 mg total) by mouth every 8 (eight) hours as needed. 20 tablet 0   polyethylene glycol (MIRALAX  / GLYCOLAX ) packet Take 17 g by mouth at bedtime.     potassium chloride  SA (KLOR-CON  M) 20 MEQ tablet Take 1 tablet (20 mEq total) by mouth 2 (two) times daily. (Patient not taking: Reported on 06/19/2023) 180 tablet 2   Probiotic Product (PROBIOTIC PO) Take 2 capsules by mouth daily.     prochlorperazine  (COMPAZINE ) 10 MG tablet  Take 1 tablet (10 mg total) by mouth every 6 (six) hours as needed for nausea or vomiting. 60 tablet 0   sodium chloride  (OCEAN) 0.65 % SOLN nasal  spray Place 1 spray into both nostrils 2 (two) times daily.     Spacer/Aero-Holding Chambers [facility] DIAMOND) MISC See admin instructions.     Syringe/Needle, Disp, (SYRINGE 3CC/27GX1-1/4") 27G X 1-1/4" 3 ML MISC Inject 1 mL into the muscle every 30 (thirty) days. Vit B12 12 each 0   tiZANidine  (ZANAFLEX ) 2 MG tablet Take 1 tablet (2 mg total) by mouth every 6 (six) hours as needed for muscle spasms. 30 tablet 0   Vitamin D , Ergocalciferol , (DRISDOL ) 50000 units CAPS capsule TAKE 1 CAPSULE (50,000 UNITS TOTAL) BY MOUTH EVERY 7 (SEVEN) DAYS. (Patient taking differently: Take 50,000 Units by mouth every Saturday.) 4 capsule 11   Zinc  50 MG TABS Take 50 mg by mouth daily.     Current Facility-Administered Medications  Medication Dose Route Frequency Provider Last Rate Last Admin   mupirocin  ointment (BACTROBAN ) 2 %   Topical Daily Arnie Lao, MD        Allergies  Allergen Reactions   Bee Venom Swelling   Doxycycline  Hives   Erythromycin Hives   Gadavist  [Gadobutrol ] Hives, Shortness Of Breath and Itching   Paxlovid  [Nirmatrelvir -Ritonavir ] Swelling and Other (See Comments)    Mouth swelling, mouth ulcers   Penicillins Hives    Did it involve swelling of the face/tongue/throat, SOB, or low BP? No Did it involve sudden or severe rash/hives, skin peeling, or any reaction on the inside of your mouth or nose? Yes Did you need to seek medical attention at a hospital or doctor's office? Yes When did it last happen?      5+ years If all above answers are "NO", may proceed with cephalosporin use.    Sulfa Antibiotics Hives   Vancomycin  Hives, Rash and Other (See Comments)    Develops "Red Man" Syndrome and welts/hives if it is run too quickly via IV- must be run slowly   Preparation H [Pramox-Pe-Glycerin-Petrolatum] Hives    Cannot use  Cream or Suppositories    Phenergan  [Promethazine  Hcl] Other (See Comments)    Causes restless leg syndrome    Review of Systems negative  except from HPI and PMH  Physical Exam Ht 5\' 3"  (1.6 m)   Wt 148 lb 3.2 oz (67.2 kg)   BMI 26.25 kg/m  Well developed and well nourished in no acute distress HENT normal Neck supple with JVP-flat Clear Device pocket well healed; without hematoma or erythema.  There is no tethering  Regular rate and rhythm, no dyspne gallop No  murmur Abd-soft with active BS No Clubbing cyanosis   edema Skin-warm and dry A & Oriented  Grossly normal sensory and motor function  ECG  A pacing @ 75 17/7/36  Device function is normal. Programming changes none  See Paceart for details     Assessment and  Plan Edema  s/p R THR  Atrial  Tach  Pacemaker Biotronik     Dysautonomia   Sinus node dysfunction   Raynaud's      No interval tachycardia.  Device function is normal.  Will arrange long-term pacemaker follow-up with Dr. Marven Slimmer per her request.  She will reach out to Dr. Malka Sea at [facility] for support for her dysautonomia . Given her concerns about hypokalemia, we will check a potassium level today.  She is to see Dr. Ethel Henry in a couple of  weeks and it can be repeated at that time to see whether potassium repletion is necessary.

## 2023-06-19 NOTE — Patient Instructions (Signed)
 Medication Instructions:  Your physician recommends that you continue on your current medications as directed. Please refer to the Current Medication list given to you today.  *If you need a refill on your cardiac medications before your next appointment, please call your pharmacy*  Lab Work: BMET  If you have labs (blood work) drawn today and your tests are completely normal, you will receive your results only by: MyChart Message (if you have MyChart) OR A paper copy in the mail If you have any lab test that is abnormal or we need to change your treatment, we will call you to review the results.  Testing/Procedures: None ordered.   Follow-Up: At [facility], you and your health needs are our priority.  As part of our continuing mission to provide you with exceptional heart care, our providers are all part of one team.  This team includes your primary Cardiologist (physician) and Advanced Practice Providers or APPs (Physician Assistants and Nurse Practitioners) who all work together to provide you with the care you need, when you need it.  Your next appointment:   6 months with Dr Marven Slimmer      1st Floor: - Lobby - Registration  - Pharmacy  - Lab - Cafe  2nd Floor: - PV Lab - Diagnostic Testing (echo, CT, nuclear med)  3rd Floor: - Vacant  4th Floor: - TCTS (cardiothoracic surgery) - AFib Clinic - Structural Heart Clinic - Vascular Surgery  - Vascular Ultrasound  5th Floor: - HeartCare Cardiology (general and EP) - Clinical Pharmacy for coumadin, hypertension, lipid, weight-loss medications, and med management appointments    Valet parking services will be available as well.

## 2023-06-19 NOTE — Telephone Encounter (Signed)
 Pt was seen today by Dr Rodolfo Clan and BMET was ordered.  Will await BMET results and Dr Doyle Generous recommendation.

## 2023-06-20 LAB — BASIC METABOLIC PANEL WITH GFR
BUN/Creatinine Ratio: 18 (ref 12–28)
BUN: 15 mg/dL (ref 8–27)
CO2: 25 mmol/L (ref 20–29)
Calcium: 9.7 mg/dL (ref 8.7–10.3)
Chloride: 99 mmol/L (ref 96–106)
Creatinine, Ser: 0.84 mg/dL (ref 0.57–1.00)
Glucose: 99 mg/dL (ref 70–99)
Potassium: 4.1 mmol/L (ref 3.5–5.2)
Sodium: 141 mmol/L (ref 134–144)
eGFR: 77 mL/min/{1.73_m2} (ref >59)

## 2023-06-20 NOTE — Telephone Encounter (Signed)
 Spoke to pt. Please see other phone encounter.

## 2023-06-20 NOTE — Telephone Encounter (Signed)
 Pt was contacted by her Cardiology team and was seen yesterday regarding to Rx Potassium. Please see phone encounter.

## 2023-06-20 NOTE — Telephone Encounter (Signed)
 Please see other phone encounter

## 2023-06-27 ENCOUNTER — Ambulatory Visit: Admitting: Internal Medicine

## 2023-06-27 ENCOUNTER — Encounter: Payer: Self-pay | Admitting: Internal Medicine

## 2023-06-27 VITALS — BP 102/82 | HR 65 | Temp 97.7°F | Ht 63.0 in | Wt 146.4 lb

## 2023-06-27 DIAGNOSIS — Z79899 Other long term (current) drug therapy: Secondary | ICD-10-CM

## 2023-06-27 DIAGNOSIS — T39395A Adverse effect of other nonsteroidal anti-inflammatory drugs [NSAID], initial encounter: Secondary | ICD-10-CM | POA: Diagnosis not present

## 2023-06-27 DIAGNOSIS — E538 Deficiency of other specified B group vitamins: Secondary | ICD-10-CM

## 2023-06-27 DIAGNOSIS — E099 Drug or chemical induced diabetes mellitus without complications: Secondary | ICD-10-CM

## 2023-06-27 DIAGNOSIS — R053 Chronic cough: Secondary | ICD-10-CM

## 2023-06-27 DIAGNOSIS — I10 Essential (primary) hypertension: Secondary | ICD-10-CM | POA: Diagnosis not present

## 2023-06-27 DIAGNOSIS — F419 Anxiety disorder, unspecified: Secondary | ICD-10-CM

## 2023-06-27 DIAGNOSIS — E559 Vitamin D deficiency, unspecified: Secondary | ICD-10-CM | POA: Diagnosis not present

## 2023-06-27 DIAGNOSIS — Z96641 Presence of right artificial hip joint: Secondary | ICD-10-CM

## 2023-06-27 LAB — POCT GLYCOSYLATED HEMOGLOBIN (HGB A1C): Hemoglobin A1C: 5.5 % (ref 4.0–5.6)

## 2023-06-27 LAB — BASIC METABOLIC PANEL WITH GFR
BUN: 13 mg/dL (ref 6–23)
CO2: 26 meq/L (ref 19–32)
Calcium: 9.5 mg/dL (ref 8.4–10.5)
Chloride: 100 meq/L (ref 96–112)
Creatinine, Ser: 0.87 mg/dL (ref 0.40–1.20)
GFR: 69.88 mL/min (ref >60.00)
Glucose, Bld: 155 mg/dL — ABNORMAL HIGH (ref 70–99)
Potassium: 3.5 meq/L (ref 3.5–5.1)
Sodium: 136 meq/L (ref 135–145)

## 2023-06-27 LAB — VITAMIN D 25 HYDROXY (VIT D DEFICIENCY, FRACTURES): VITD: 95.34 ng/mL (ref 30.00–100.00)

## 2023-06-27 LAB — VITAMIN B12: Vitamin B-12: 550 pg/mL (ref 211–911)

## 2023-06-27 MED ORDER — CLONAZEPAM 1 MG PO TABS: ORAL_TABLET | ORAL | 2 refills | Status: DC

## 2023-06-27 MED ORDER — NYSTATIN 100000 UNIT/ML MT SUSP: 3.0000 mL | Freq: Three times a day (TID) | OROMUCOSAL | 4 refills | Status: AC | PRN

## 2023-06-27 MED ORDER — POTASSIUM CHLORIDE CRYS ER 20 MEQ PO TBCR: 20.0000 meq | EXTENDED_RELEASE_TABLET | Freq: Two times a day (BID) | ORAL | 3 refills | Status: DC

## 2023-06-27 NOTE — Progress Notes (Signed)
 Chief Complaint  Patient presents with   Medical Management of Chronic Issues    Pt is here to go over all of her medications.    Cough    Pt c/o cough that going on for several wks. Dry cough. Worse at night.     HPI: [name] 68 y.o. come in for Chronic disease management  Here with spouse for an  number of issues to address   Coughing all night still   worse when lays down  and  down  Perles may help. No fever   not using inhaler reg  only as needed . Staying active  no fever  Uses albuterol  inhaler prn   and budesonide   nebulizer  Take over  some meds  Dr Rodolfo Clan out some and will retire does have future appt with new  new cardiologist  eventually.  Has a number of prn meds   including as needed lasix  and then potassium on days she should take  diuretic  As needed use of klonopin    Staying active s/p hip surgery Rash may be an autoimmune  eczematous reaction and  responding to duplxent Uses ocass nystatin  oral  if needed for thrush( autoimmune other cause) responds well  wants to have access to refill on hand  if needed  Vit d and b12 to be monitored  ROS: See pertinent positives and negatives per HPI. No current cp sob   positive cough  some gerd sx  possible, raynauds hx   Past Medical History:  Diagnosis Date   Anemia    Angio-edema    Anxiety    Arthritis    Asthma    Atrial fibrillation (HCC)    Atrial tachycardia (HCC)    Autonomic dysfunction    Complication of anesthesia    pt states wakes up with "shakes"   CVA (cerebral infarction)    2012 with dizziness and vision change felt embolic from atrial tachy   Disorder of bone and cartilage, unspecified    Diverticulosis    DM (diabetes mellitus) (HCC)    DVT (deep venous thrombosis) (HCC)    Eczema    Family history of anesthesia complication    PONV and " shaking "   Fatty liver    GERD (gastroesophageal reflux disease)    History of hiatal hernia    History of radiation therapy 10/24/2012   brain    HLD (hyperlipidemia)    HTN (hypertension)    Hypoglycemia, unspecified    Liver metastasis    Lung metastasis    Metastasis to brain (HCC)    Osteopenia    Other nonspecific abnormal serum enzyme levels    Other specified congenital anomalies of nervous system    Pacemaker    autonomic dysfunction   Pancreatitis    from therapy   Peripheral vascular disease (HCC)    POTS (postural orthostatic tachycardia syndrome)    Raynaud's disease    due to chemo   Skin cancer    Hx: of lung lesion    Family History  Problem Relation Age of Onset   Allergic rhinitis Sister    Stroke Mother    Colon polyps Mother    Colon cancer Mother    Urticaria Mother    Allergic rhinitis Mother    Heart disease Father    Diabetes Father    Kidney disease Father    Diabetes Maternal Grandmother    Clotting disorder Maternal Grandmother  stroke   Crohn's disease Maternal Grandmother    Diabetes Paternal Grandmother    Aneurysm Sister        brain    Social History   Socioeconomic History   Marital status: Married    Spouse name: Not on file   Number of children: 0   Years of education: Not on file   Highest education level: Not on file  Occupational History   Occupation: PRESIDENT    Employer: [employer]    Comment: self employed  Tobacco Use   Smoking status: Never   Smokeless tobacco: Never  Vaping Use   Vaping status: Never Used  Substance and Sexual Activity   Alcohol use: Never    Comment: glass of wine daily   Drug use: No   Sexual activity: Not on file  Other Topics Concern   Not on file  Social History Narrative   4 years college hh of 2    Neg tad   Social Drivers of Corporate investment banker Strain: Low Risk  (11/22/2021)   Overall Financial Resource Strain (CARDIA)    Difficulty of Paying Living Expenses: Not very hard  Food Insecurity: No Food Insecurity (06/08/2022)   Hunger Vital Sign    Worried About Running Out of Food in the Last  Year: Never true    Ran Out of Food in the Last Year: Never true  Transportation Needs: No Transportation Needs (05/18/2022)   PRAPARE - Administrator, Civil Service (Medical): No    Lack of Transportation (Non-Medical): No  Physical Activity: Sufficiently Active (03/14/2021)   Exercise Vital Sign    Days of Exercise per Week: 4 days    Minutes of Exercise per Session: 90 min  Stress: No Stress Concern Present (03/14/2021)   [employer] of Occupational Health - Occupational Stress Questionnaire    Feeling of Stress : Not at all  Recent Concern: Stress - Stress Concern Present (12/26/2020)   [employer] of Occupational Health - Occupational Stress Questionnaire    Feeling of Stress : To some extent  Social Connections: Socially Integrated (03/14/2021)   Social Connection and Isolation Panel [NHANES]    Frequency of Communication with Friends and Family: More than three times a week    Frequency of Social Gatherings with Friends and Family: More than three times a week    Attends Religious Services: More than 4 times per year    Active Member of Clubs or Organizations: Yes    Attends Engineer, structural: More than 4 times per year    Marital Status: Married    Outpatient Medications Prior to Visit  Medication Sig Dispense Refill   acetaminophen  (TYLENOL ) 500 MG tablet Take 500 mg by mouth See admin instructions. Take 500 mg by mouth at bedtime and an additional 500 mg three times a day as needed for pain     albuterol  (PROVENTIL ) (2.5 MG/3ML) 0.083% nebulizer solution Take 3 mLs (2.5 mg total) by nebulization every 4 (four) hours as needed for wheezing or shortness of breath. 180 mL 1   albuterol  (VENTOLIN  HFA) 108 (90 Base) MCG/ACT inhaler Inhale 2 puffs into the lungs every 4 (four) hours as needed for wheezing or shortness of breath. 1 each 0   Ascorbic Acid  (VITAMIN C ) 1000 MG tablet Take 2,000 mg by mouth daily.     augmented betamethasone  dipropionate  (DIPROLENE ) 0.05 % ointment Apply topically 2 (two) times daily. Limit to no longer than  2 weeks use. Not on face. (Patient taking differently: Apply 1 Application topically 2 (two) times daily as needed (hives). Limit to no longer than 2 weeks use. Not on face.) 50 g 0   budesonide  (ENTOCORT EC ) 3 MG 24 hr capsule Take 3 mg by mouth as needed for rash.     calcium  carbonate (TUMS EX) 750 MG chewable tablet Chew 1 tablet by mouth daily.     clobetasol cream (TEMOVATE) 0.05 % Apply 1 application topically 2 (two) times daily as needed (rash).     cloNIDine  (CATAPRES ) 0.1 MG tablet TAKE 1 TABLET BY MOUTH EVERY NIGHT AT BEDTIME 90 tablet 2   Desoximetasone  (TOPICORT ) 0.25 % ointment Apply 1 application topically 2 (two) times daily. (Patient taking differently: Apply 1 application  topically 2 (two) times daily as needed (rash).) 30 g 1   diphenhydrAMINE  (BENADRYL ) 25 MG tablet Take 25-50 mg by mouth daily as needed for allergies.     Dupilumab (DUPIXENT) 300 MG/2ML SOAJ Inject into the skin.     EPINEPHrine  0.3 mg/0.3 mL IJ SOAJ injection Inject 0.3 mg into the muscle as needed for anaphylaxis.     fexofenadine (ALLEGRA) 180 MG tablet Take 180 mg by mouth daily as needed for allergies or rhinitis.     Fluocinonide 0.1 % CREA Apply 1 application  topically 2 (two) times daily as needed. On affected areas     fluticasone  (FLONASE ) 50 MCG/ACT nasal spray Place 2 sprays into both nostrils daily. (Patient taking differently: Place 2 sprays into both nostrils daily as needed for allergies.) 16 g 0   furosemide  (LASIX ) 20 MG tablet TAKE 1 TABLET BY MOUTH DAILY AS NEEDED 90 tablet 3   HYDROcodone -acetaminophen  (NORCO/VICODIN) 5-325 MG tablet Take 1-2 tablets by mouth every 6 (six) hours as needed for moderate pain. 30 tablet 0   hydrocortisone  (ANUSOL -HC) 25 MG suppository Place 25 mg rectally as needed for hemorrhoids.     ibuprofen  (ADVIL ) 800 MG tablet Take 1 tablet (800 mg total) by mouth every 8 (eight)  hours as needed. 60 tablet 3   labetalol  (NORMODYNE ) 100 MG tablet Take 0.5 tablets (50 mg total) by mouth 2 (two) times daily. 90 tablet 3   levETIRAcetam  (KEPPRA ) 250 MG tablet Take 250 mg by mouth See admin instructions. Take 250 mg by mouth at 8 AM, 2 PM, and 8 PM     levocetirizine (XYZAL ) 5 MG tablet TAKE 1 TABLET BY MOUTH EVERY DAY AS NEEDED 30 tablet 0   lidocaine  (XYLOCAINE ) 2 % solution 5mL swish and swallow as needed for sore throat; take with nystatin  susp 200 mL 0   methocarbamol  (ROBAXIN ) 500 MG tablet Take 1 tablet (500 mg total) by mouth every 6 (six) hours as needed for muscle spasms. 40 tablet 1   Olopatadine  HCl (PATADAY ) 0.2 % SOLN Use one drop in each eye once daily as needed. (Patient taking differently: Place 1 drop into both eyes daily as needed (itchy eyes).) 2.5 mL 5   ondansetron  (ZOFRAN -ODT) 4 MG disintegrating tablet Take 1-2 tablets (4-8 mg total) by mouth every 8 (eight) hours as needed. 20 tablet 0   polyethylene glycol (MIRALAX  / GLYCOLAX ) packet Take 17 g by mouth at bedtime.     Probiotic Product (PROBIOTIC PO) Take 2 capsules by mouth daily.     prochlorperazine  (COMPAZINE ) 10 MG tablet Take 1 tablet (10 mg total) by mouth every 6 (six) hours as needed for nausea or vomiting. 60 tablet 0   sodium  chloride (OCEAN) 0.65 % SOLN nasal spray Place 1 spray into both nostrils as needed.     Spacer/Aero-Holding Chambers [facility] DIAMOND) MISC See admin instructions. PRN     Syringe/Needle, Disp, (SYRINGE 3CC/27GX1-1/4") 27G X 1-1/4" 3 ML MISC Inject 1 mL into the muscle every 30 (thirty) days. Vit B12 (Patient taking differently: Inject 1 mL into the muscle every 30 (thirty) days. Vit B12. To discuss with provider for B12) 12 each 0   Vitamin D , Ergocalciferol , (DRISDOL ) 50000 units CAPS capsule TAKE 1 CAPSULE (50,000 UNITS TOTAL) BY MOUTH EVERY 7 (SEVEN) DAYS. (Patient taking differently: Take 50,000 Units by mouth every Saturday.) 4 capsule 11   Zinc  50 MG TABS Take  50 mg by mouth daily.     clobetasol ointment (TEMOVATE) 0.05 % Apply two times a day as needed     clonazePAM  (KLONOPIN ) 1 MG tablet TAKE ONE TABLET BY MOUTH 2-3 TIMES A DAY AS NEEDED (Patient taking differently: daily. TAKE ONE TABLET BY MOUTH 2-3 TIMES A DAY AS NEEDED) 70 tablet 0   nystatin  (MYCOSTATIN ) 100000 UNIT/ML suspension Use as directed 5 mLs (500,000 Units total) in the mouth or throat 3 (three) times daily as needed (thrush). (Patient taking differently: Use as directed 3 mLs in the mouth or throat 3 (three) times daily as needed (thrush).) 60 mL 2   potassium chloride  SA (KLOR-CON  M) 20 MEQ tablet Take 1 tablet (20 mEq total) by mouth 2 (two) times daily. (Patient taking differently: Take 20 mEq by mouth 2 (two) times daily. Pt would like to discuss with provider today.) 180 tablet 2   cephALEXin  (KEFLEX ) 500 MG capsule Take 1 capsule (500 mg total) by mouth 2 (two) times daily. (Patient not taking: Reported on 06/27/2023) 14 capsule 0   cyanocobalamin  (VITAMIN B12) 1000 MCG/ML injection INJECT 1ML INTRAMUSCULARLY EVERY 30 DAYS (Patient not taking: Reported on 06/27/2023) 3 mL 4   tiZANidine  (ZANAFLEX ) 2 MG tablet Take 1 tablet (2 mg total) by mouth every 6 (six) hours as needed for muscle spasms. (Patient not taking: Reported on 06/27/2023) 30 tablet 0   aspirin  81 MG chewable tablet Chew 1 tablet (81 mg total) by mouth 2 (two) times daily. (Patient not taking: Reported on 06/27/2023) 35 tablet 0   Facility-Administered Medications Prior to Visit  Medication Dose Route Frequency Provider Last Rate Last Admin   mupirocin  ointment (BACTROBAN ) 2 %   Topical Daily Arnie Lao, MD         EXAM:  BP 102/82 (BP Location: Left Arm, Patient Position: Sitting, Cuff Size: Normal)   Pulse 65   Temp 97.7 F (36.5 C) (Oral)   Ht 5\' 3"  (1.6 m)   Wt 146 lb 6.4 oz (66.4 kg)   SpO2 95%   BMI 25.93 kg/m   Body mass index is 25.93 kg/m.  GENERAL: vitals reviewed and listed above,  alert, oriented, appears well hydrated and in no acute distress HEENT: atraumatic, conjunctiva  clear, no obvious abnormalities on inspection of external nose and ears OP : no lesion edema or exudate  NECK: no obvious masses on inspection palpation  LUNGS: clear to auscultation bilaterally, no wheezes, rales or rhonchi, good air movement CV: HRRR, no clubbing cyanosis or  peripheral edema nl cap refill  MS: moves all extremities without noticeable focal  abnormality PSYCH: pleasant and cooperative, no obvious depression or anxiety Lab Results  Component Value Date   WBC 11.2 (H) 05/19/2022   HGB 12.1 05/19/2022   HCT 36.6  05/19/2022   PLT 216 05/19/2022   GLUCOSE 155 (H) 06/27/2023   CHOL 220 (H) 07/07/2021   TRIG 179 (H) 07/07/2021   HDL 55 07/07/2021   LDLDIRECT 129 (H) 07/07/2021   LDLCALC 133 (H) 07/07/2021   ALT 18 10/11/2022   AST 23 10/11/2022   NA 136 06/27/2023   K 3.5 06/27/2023   CL 100 06/27/2023   CREATININE 0.87 06/27/2023   BUN 13 06/27/2023   CO2 26 06/27/2023   TSH 1.146 12/17/2013   INR 1.03 08/28/2013   HGBA1C 5.5 06/27/2023   BP Readings from Last 3 Encounters:  06/27/23 102/82  06/19/23 130/82  03/15/23 132/86    ASSESSMENT AND PLAN:  Discussed the following assessment and plan:  Medication management - Plan: Basic metabolic panel with GFR, Vitamin D , 25-hydroxy, Vitamin B12, Basic Metabolic Panel  Cough, persistent  HYPERTENSION, BENIGN - Plan: Basic metabolic panel with GFR, Vitamin D , 25-hydroxy, Vitamin B12  Vitamin B12 deficiency - Plan: Basic metabolic panel with GFR, Vitamin D , 25-hydroxy, Vitamin B12  Vitamin D  deficiency - Plan: Basic metabolic panel with GFR, Vitamin D , 25-hydroxy, Vitamin B12  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  Rodolfo Clan prescribed meds in his absence and until she establishes with new Cardiology  dr Marven Slimmer.  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  Patient Instructions  Good to see  you today .  Use inhaler before  exercise  Will check  potassium vit d and b12    Ok to take over  Dr Antone Batten meds for now. Potassium to take as needed with lasix    Will refill the nystatin  susp  Can try  nexium  for  2 weeks and see if helps the cough reflux for now  Let us  know    Joaquim Muir. Aki Abalos M.D.

## 2023-06-27 NOTE — Patient Instructions (Addendum)
 Good to see  you today .  Use inhaler before  exercise  Will check  potassium vit d and b12    Ok to take over  Dr Antone Batten meds for now. Potassium to take as needed with lasix    Will refill the nystatin  susp  Can try  nexium  for  2 weeks and see if helps the cough reflux for now  Let us  know

## 2023-06-28 ENCOUNTER — Other Ambulatory Visit: Payer: Self-pay | Admitting: Internal Medicine

## 2023-06-28 ENCOUNTER — Telehealth: Payer: Self-pay

## 2023-06-28 NOTE — Telephone Encounter (Unsigned)
 Copied from CRM [id number]. Topic: Clinical - Medication Refill >> Jun 28, 2023  9:22 AM Marlan Silva wrote: Most Recent Primary Care Visit:  Provider: Bambi Lever A  Department: [facility]  Visit Type: MYCHART VIDEO VISIT  Date: 04/08/2023  Medication: potassium chloride  SA (KLOR-CON  M) 20 MEQ tablet   Has the patient contacted their pharmacy? Yes (Agent: If no, request that the patient contact the pharmacy for the refill. If patient does not wish to contact the pharmacy document the reason why and proceed with request.) (Agent: If yes, when and what did the pharmacy advise?)  Is this the correct pharmacy for this prescription? Yes If no, delete pharmacy and type the correct one.  This is the patient's preferred pharmacy:  [facility] [id number] - [place], [place] - [address] [place]  [address] Phone: [phone] Fax: [phone]  [id number] - [place], [name] - [address] [name] [address] Phone: [phone] Fax: [phone]  [facility] [name] [address] Phone: [phone] Fax: [phone]  [facility] [place] [facility] [address] [name] [address] Phone: [phone] Fax: [phone]   Has the prescription been filled recently? Yes  Is the patient out of the medication? Yes  Has the patient been seen for an appointment in the last year OR does the patient have an upcoming appointment? Yes  Can we respond through MyChart? Yes  Agent: Please be advised that Rx refills may take up to 3 business days. We ask that you follow-up with your pharmacy.

## 2023-06-28 NOTE — Telephone Encounter (Signed)
 Copied from CRM [id number]. Topic: Clinical - Lab/Test Results >> Jun 28, 2023  9:23 AM [name] wrote: Reason for CRM: Patient is requesting a call back to go over her labs in detail, and she has some questions for Dr. Ethel Henry. Patient can be reached at the phone number on file.

## 2023-06-30 LAB — CUP PACEART INCLINIC DEVICE CHECK
Date Time Interrogation Session: 20250423172554
Implantable Lead Connection Status: 753985
Implantable Lead Connection Status: 753985
Implantable Lead Implant Date: 19990303
Implantable Lead Implant Date: 19990303
Implantable Lead Location: 753859
Implantable Lead Location: 753860
Implantable Lead Model: 5092
Implantable Pulse Generator Implant Date: 20120905
Pulse Gen Serial Number: 66195584

## 2023-07-01 NOTE — Telephone Encounter (Signed)
 Spoke to pt. Pt reports she wants to talk to provider about her lab results. Inform pt lab result has not review by provider yet. Pt reports her glucose is high and usually it doesn't. Pt informs on the day of blood drawn, the lab couldn't get her blood drawn and advise her to go for lunch then come back. They were able get the blood after lunch.   Pt states her cough is still bothering her and has been taking antacid daily since on the day of visit. No relief.  She states when she is laying down, she feel like acid coming up. She continues she feels pressure between breast bone. Pt states she is concerns of aspiration.   Pt brought up a CT scan that she had in the past. She states she had a bad hernia show up on every of her CT scan. Pt would like to provider to order CT scan for hernia also for cancer. To have it done at [facility]. Pt states she wants to avoid radiation expose.   Inform pt she can follow up  with provider about her cough and CT scan order. Scheduled a virtual appt for pt on 07/09/2023.   Please advise.

## 2023-07-02 ENCOUNTER — Encounter: Payer: Self-pay | Admitting: Internal Medicine

## 2023-07-02 NOTE — Progress Notes (Signed)
 B12 vit d in range  potassium is normal but low normal .borderline  A1c is normal  May want to take  potassium once a day for 5 days to boost level   . And then when take lasix   kidney function is normal and reassuring

## 2023-07-09 ENCOUNTER — Telehealth (INDEPENDENT_AMBULATORY_CARE_PROVIDER_SITE_OTHER): Admitting: Internal Medicine

## 2023-07-09 ENCOUNTER — Encounter: Payer: Self-pay | Admitting: Internal Medicine

## 2023-07-09 VITALS — BP 118/82 | HR 62 | Temp 96.8°F | Ht 63.0 in | Wt 142.5 lb

## 2023-07-09 DIAGNOSIS — K529 Noninfective gastroenteritis and colitis, unspecified: Secondary | ICD-10-CM | POA: Diagnosis not present

## 2023-07-09 DIAGNOSIS — R053 Chronic cough: Secondary | ICD-10-CM | POA: Diagnosis not present

## 2023-07-09 DIAGNOSIS — Z79899 Other long term (current) drug therapy: Secondary | ICD-10-CM | POA: Diagnosis not present

## 2023-07-09 DIAGNOSIS — E876 Hypokalemia: Secondary | ICD-10-CM | POA: Diagnosis not present

## 2023-07-09 NOTE — Progress Notes (Signed)
 Virtual Visit via Video Note  I connected with [name] on 07/09/23 at  3:30 PM EDT by a video enabled telemedicine application and verified that I am speaking with the correct person using two identifiers. Location patient: home Location provider:work office Persons participating in the virtual visit: patient, provider   Patient aware  of the limitations of evaluation and management by telemedicine and  availability of in person appointments. and agreed to proceed.   HPI: [name] presents for video visit  on going cough when lays down the same  but problem is on going post prandial and other diarrhea for a while  Noted potassium drop off of K supp even when not taking lasix .  [facility] to do full eval and ct  to check for underlying cause and if ok may get their Gi team to help evaluation.  Got leg cramps...  see lab 3.2 potassium  Now back on bid K +  unc plans fu lab  .   ROS: See pertinent positives and negatives per HPI.  Past Medical History:  Diagnosis Date   Anemia    Angio-edema    Anxiety    Arthritis    Asthma    Atrial fibrillation (HCC)    Atrial tachycardia (HCC)    Autonomic dysfunction    Complication of anesthesia    pt states wakes up with "shakes"   CVA (cerebral infarction)    2012 with dizziness and vision change felt embolic from atrial tachy   Disorder of bone and cartilage, unspecified    Diverticulosis    DM (diabetes mellitus) (HCC)    DVT (deep venous thrombosis) (HCC)    Eczema    Family history of anesthesia complication    PONV and " shaking "   Fatty liver    GERD (gastroesophageal reflux disease)    History of hiatal hernia    History of radiation therapy 10/24/2012   brain   HLD (hyperlipidemia)    HTN (hypertension)    Hypoglycemia, unspecified    Liver metastasis    Lung metastasis    Metastasis to brain (HCC)    Osteopenia    Other nonspecific abnormal serum enzyme levels    Other specified congenital  anomalies of nervous system    Pacemaker    autonomic dysfunction   Pancreatitis    from therapy   Peripheral vascular disease (HCC)    POTS (postural orthostatic tachycardia syndrome)    Raynaud's disease    due to chemo   Skin cancer    Hx: of lung lesion    Past Surgical History:  Procedure Laterality Date   ABLATION SAPHENOUS VEIN W/ RFA     2002 x3   CARPAL TUNNEL RELEASE Left 2000   CHOLECYSTECTOMY N/A 03/28/2018   Procedure: LAPAROSCOPIC CHOLECYSTECTOMY WITH INTRAOPERATIVE CHOLANGIOGRAM;  Surgeon: Jacolyn Matar, MD;  Location: [facility] OR;  Service: General;  Laterality: N/A;   CRANIOTOMY N/A 09/30/2012   suboccipital craniectomy   CRANIOTOMY Left 02/09/2013   Procedure: LEFT PARIETAL CRANIOTOMY with stealth;  Surgeon: Elna Haggis, MD;  Location: [facility] ORS;  Service: Neurosurgery;  Laterality: Left;  LEFT Parietal Craniotomy for tumor with stealth   DEEP AXILLARY SENTINEL NODE BIOPSY / EXCISION     due to extensive Melanoma-right arm   fatty tumor removed  2000   from chest   INSERT / REPLACE / REMOVE PACEMAKER  2012   1999, x 3   LAPAROSCOPY  09/06/2011   Procedure: LAPAROSCOPY  OPERATIVE;  Surgeon: Marshel Skeeters. Johnston Nao, MD;  Location: [facility] ORS;  Service: Gynecology;  Laterality: Left;  with Left Ovarian Cystectomy    MELANOMA EXCISION     with removal of lymph nodes, left shoulder   NOSE SURGERY  2010, 2012   for nose bleeds x 2   SINOSCOPY     TONSILLECTOMY AND ADENOIDECTOMY     TOTAL HIP ARTHROPLASTY Right 05/18/2022   Procedure: RIGHT TOTAL HIP ARTHROPLASTY ANTERIOR APPROACH;  Surgeon: Arnie Lao, MD;  Location: [facility] ORS;  Service: Orthopedics;  Laterality: Right;    Family History  Problem Relation Age of Onset   Allergic rhinitis Sister    Stroke Mother    Colon polyps Mother    Colon cancer Mother    Urticaria Mother    Allergic rhinitis Mother    Heart disease Father    Diabetes Father    Kidney disease Father    Diabetes Maternal Grandmother     Clotting disorder Maternal Grandmother        stroke   Crohn's disease Maternal Grandmother    Diabetes Paternal Grandmother    Aneurysm Sister        brain    Social History   Tobacco Use   Smoking status: Never   Smokeless tobacco: Never  Vaping Use   Vaping status: Never Used  Substance Use Topics   Alcohol use: Never    Comment: glass of wine daily   Drug use: No     Current Outpatient Medications:    acetaminophen  (TYLENOL ) 500 MG tablet, Take 500 mg by mouth See admin instructions. Take 500 mg by mouth at bedtime and an additional 500 mg three times a day as needed for pain, Disp: , Rfl:    albuterol  (PROVENTIL ) (2.5 MG/3ML) 0.083% nebulizer solution, Take 3 mLs (2.5 mg total) by nebulization every 4 (four) hours as needed for wheezing or shortness of breath., Disp: 180 mL, Rfl: 1   albuterol  (VENTOLIN  HFA) 108 (90 Base) MCG/ACT inhaler, Inhale 2 puffs into the lungs every 4 (four) hours as needed for wheezing or shortness of breath., Disp: 1 each, Rfl: 0   Ascorbic Acid  (VITAMIN C ) 1000 MG tablet, Take 2,000 mg by mouth daily., Disp: , Rfl:    augmented betamethasone  dipropionate (DIPROLENE ) 0.05 % ointment, Apply topically 2 (two) times daily. Limit to no longer than 2 weeks use. Not on face. (Patient taking differently: Apply 1 Application topically 2 (two) times daily as needed (hives). Limit to no longer than 2 weeks use. Not on face.), Disp: 50 g, Rfl: 0   budesonide  (ENTOCORT EC ) 3 MG 24 hr capsule, Take 3 mg by mouth as needed for rash., Disp: , Rfl:    calcium  carbonate (TUMS EX) 750 MG chewable tablet, Chew 1 tablet by mouth daily., Disp: , Rfl:    clobetasol cream (TEMOVATE) 0.05 %, Apply 1 application topically 2 (two) times daily as needed (rash)., Disp: , Rfl:    clonazePAM  (KLONOPIN ) 1 MG tablet, TAKE ONE TABLET BY MOUTH 2-3 TIMES A DAY AS NEEDED, Disp: 90 tablet, Rfl: 2   cloNIDine  (CATAPRES ) 0.1 MG tablet, TAKE 1 TABLET BY MOUTH EVERY NIGHT AT BEDTIME, Disp:  90 tablet, Rfl: 2   Desoximetasone  (TOPICORT ) 0.25 % ointment, Apply 1 application topically 2 (two) times daily. (Patient taking differently: Apply 1 application  topically 2 (two) times daily as needed (rash).), Disp: 30 g, Rfl: 1   diphenhydrAMINE  (BENADRYL ) 25 MG tablet, Take 25-50 mg by mouth  daily as needed for allergies., Disp: , Rfl:    Dupilumab (DUPIXENT) 300 MG/2ML SOAJ, Inject into the skin., Disp: , Rfl:    EPINEPHrine  0.3 mg/0.3 mL IJ SOAJ injection, Inject 0.3 mg into the muscle as needed for anaphylaxis., Disp: , Rfl:    fexofenadine (ALLEGRA) 180 MG tablet, Take 180 mg by mouth daily as needed for allergies or rhinitis., Disp: , Rfl:    Fluocinonide 0.1 % CREA, Apply 1 application  topically 2 (two) times daily as needed. On affected areas, Disp: , Rfl:    fluticasone  (FLONASE ) 50 MCG/ACT nasal spray, Place 2 sprays into both nostrils daily. (Patient taking differently: Place 2 sprays into both nostrils daily as needed for allergies.), Disp: 16 g, Rfl: 0   furosemide  (LASIX ) 20 MG tablet, TAKE 1 TABLET BY MOUTH DAILY AS NEEDED, Disp: 90 tablet, Rfl: 3   HYDROcodone -acetaminophen  (NORCO/VICODIN) 5-325 MG tablet, Take 1-2 tablets by mouth every 6 (six) hours as needed for moderate pain., Disp: 30 tablet, Rfl: 0   hydrocortisone  (ANUSOL -HC) 25 MG suppository, Place 25 mg rectally as needed for hemorrhoids., Disp: , Rfl:    ibuprofen  (ADVIL ) 800 MG tablet, Take 1 tablet (800 mg total) by mouth every 8 (eight) hours as needed., Disp: 60 tablet, Rfl: 3   labetalol  (NORMODYNE ) 100 MG tablet, Take 0.5 tablets (50 mg total) by mouth 2 (two) times daily., Disp: 90 tablet, Rfl: 3   levETIRAcetam  (KEPPRA ) 250 MG tablet, Take 250 mg by mouth See admin instructions. Take 250 mg by mouth at 8 AM, 2 PM, and 8 PM, Disp: , Rfl:    levocetirizine (XYZAL ) 5 MG tablet, TAKE 1 TABLET BY MOUTH EVERY DAY AS NEEDED, Disp: 30 tablet, Rfl: 0   lidocaine  (XYLOCAINE ) 2 % solution, 5mL swish and swallow as needed  for sore throat; take with nystatin  susp, Disp: 200 mL, Rfl: 0   methocarbamol  (ROBAXIN ) 500 MG tablet, Take 1 tablet (500 mg total) by mouth every 6 (six) hours as needed for muscle spasms., Disp: 40 tablet, Rfl: 1   nystatin  (MYCOSTATIN ) 100000 UNIT/ML suspension, Use as directed 3 mLs (300,000 Units total) in the mouth or throat 3 (three) times daily as needed (thrush)., Disp: 60 mL, Rfl: 4   Olopatadine  HCl (PATADAY ) 0.2 % SOLN, Use one drop in each eye once daily as needed. (Patient taking differently: Place 1 drop into both eyes daily as needed (itchy eyes).), Disp: 2.5 mL, Rfl: 5   ondansetron  (ZOFRAN -ODT) 4 MG disintegrating tablet, Take 1-2 tablets (4-8 mg total) by mouth every 8 (eight) hours as needed., Disp: 20 tablet, Rfl: 0   polyethylene glycol (MIRALAX  / GLYCOLAX ) packet, Take 17 g by mouth at bedtime., Disp: , Rfl:    potassium chloride  SA (KLOR-CON  M) 20 MEQ tablet, Take 1 tablet (20 mEq total) by mouth 2 (two) times daily. On days when taking furosemide , Disp: 60 tablet, Rfl: 3   Probiotic Product (PROBIOTIC PO), Take 2 capsules by mouth daily., Disp: , Rfl:    prochlorperazine  (COMPAZINE ) 10 MG tablet, Take 1 tablet (10 mg total) by mouth every 6 (six) hours as needed for nausea or vomiting., Disp: 60 tablet, Rfl: 0   sodium chloride  (OCEAN) 0.65 % SOLN nasal spray, Place 1 spray into both nostrils as needed., Disp: , Rfl:    Spacer/Aero-Holding Chambers (OPTICHAMBER DIAMOND) MISC, See admin instructions. PRN, Disp: , Rfl:    Syringe/Needle, Disp, (SYRINGE 3CC/27GX1-1/4") 27G X 1-1/4" 3 ML MISC, Inject 1 mL into the muscle every  30 (thirty) days. Vit B12 (Patient taking differently: Inject 1 mL into the muscle every 30 (thirty) days. Vit B12. To discuss with provider for B12), Disp: 12 each, Rfl: 0   Vitamin D , Ergocalciferol , (DRISDOL ) 50000 units CAPS capsule, TAKE 1 CAPSULE (50,000 UNITS TOTAL) BY MOUTH EVERY 7 (SEVEN) DAYS. (Patient taking differently: Take 50,000 Units by mouth  every Saturday.), Disp: 4 capsule, Rfl: 11   Zinc  50 MG TABS, Take 50 mg by mouth daily., Disp: , Rfl:    cephALEXin  (KEFLEX ) 500 MG capsule, Take 1 capsule (500 mg total) by mouth 2 (two) times daily. (Patient not taking: Reported on 07/09/2023), Disp: 14 capsule, Rfl: 0   cyanocobalamin  (VITAMIN B12) 1000 MCG/ML injection, INJECT 1ML INTRAMUSCULARLY EVERY 30 DAYS (Patient not taking: Reported on 07/09/2023), Disp: 3 mL, Rfl: 4   tiZANidine  (ZANAFLEX ) 2 MG tablet, Take 1 tablet (2 mg total) by mouth every 6 (six) hours as needed for muscle spasms. (Patient not taking: Reported on 07/09/2023), Disp: 30 tablet, Rfl: 0  Current Facility-Administered Medications:    mupirocin  ointment (BACTROBAN ) 2 %, , Topical, Daily, Arnie Lao, MD  EXAM: BP Readings from Last 3 Encounters:  07/09/23 118/82  06/27/23 102/82  06/19/23 130/82    VITALS per patient if applicable:  GENERAL: alert, oriented, appears well and in no acute distress  HEENT: atraumatic, conjunttiva clear, no obvious abnormalities on inspection of external nose and ears  NECK: normal movements of the head and neck  LUNGS: on inspection no signs of respiratory distress, breathing rate appears normal, no obvious gross SOB, gasping or wheezing  CV: no obvious cyanosis  MS: moves all visible extremities without noticeable abnormality  PSYCH/NEURO: pleasant and cooperative, no obvious depression or anxiety, speech and thought processing grossly intact Lab Results  Component Value Date   WBC 11.2 (H) 05/19/2022   HGB 12.1 05/19/2022   HCT 36.6 05/19/2022   PLT 216 05/19/2022   GLUCOSE 155 (H) 06/27/2023   CHOL 220 (H) 07/07/2021   TRIG 179 (H) 07/07/2021   HDL 55 07/07/2021   LDLDIRECT 129 (H) 07/07/2021   LDLCALC 133 (H) 07/07/2021   ALT 18 10/11/2022   AST 23 10/11/2022   NA 136 06/27/2023   K 3.5 06/27/2023   CL 100 06/27/2023   CREATININE 0.87 06/27/2023   BUN 13 06/27/2023   CO2 26 06/27/2023   TSH  1.146 12/17/2013   INR 1.03 08/28/2013   HGBA1C 5.5 06/27/2023    ASSESSMENT AND PLAN:  Discussed the following assessment and plan:    ICD-10-CM   1. Cough, persistent  R05.3     2. Low serum potassium  E87.6    even when off lasix  poss related to diarrhea multipl etiology?    3. Medication management  Z79.899     4. Postprandial diarrhea  K52.9      Hx metastatic cancer   Disc  supporting  care and have [facility] do evaluation  coordination  fu potassium level  If opportunity can get lipid level  but no complelling   to do level .  Counseled.   Expectant management and discussion of plan and treatment with opportunity to ask questions and all were answered. The patient agreed with the plan and demonstrated an understanding of the instructions.   Advised to call back or seek an in-person evaluation if worsening  or having  further concerns  in interim. Return for as indicated, when planned.    Daphine Eagle, MD

## 2023-08-11 ENCOUNTER — Other Ambulatory Visit: Payer: Self-pay | Admitting: Internal Medicine

## 2023-08-12 ENCOUNTER — Telehealth: Payer: Self-pay | Admitting: Internal Medicine

## 2023-08-12 DIAGNOSIS — I4719 Other supraventricular tachycardia: Secondary | ICD-10-CM

## 2023-08-12 NOTE — Telephone Encounter (Signed)
 Pt c/o medication issue:  1. Name of Medication: Metoprolol  Tartrate 25 MG   2. How are you currently taking this medication (dosage and times per day)? As written  3. Are you having a reaction (difficulty breathing--STAT)? No   4. What is your medication issue? Pts states this medication should be refilled and was taking off her medication be mistake. Please advise

## 2023-08-12 NOTE — Telephone Encounter (Signed)
 Spoke with patient and she states she called to get a metoprolol  25 mg refilled and pharmacy states she was out of refills.  Metoprolol  not on current medication list. She states she is not sure why its off her medication list. She states she only takes labetalol  in the winter for her raynaud's disease. In the spring and summer she takes metoprolol  for her BP. She states she really needs her metoprolol  if not her BP will spike.  Can we refill

## 2023-08-12 NOTE — Telephone Encounter (Signed)
 Patient called again to follow-up on the status of her getting metoprolol  medication.

## 2023-08-12 NOTE — Telephone Encounter (Signed)
 Additional - Patient noted she only has 3 tablets of metoprolol  left.  Patient requests a call back from [employer].

## 2023-08-13 NOTE — Telephone Encounter (Signed)
Routing back to triage.

## 2023-08-13 NOTE — Telephone Encounter (Addendum)
 Spoke with patient. Explained to pt that metoprolol  tartrate was previously discontinued at 12/18/22 OV by Dr. Rodolfo Clan and she was to start on labetalol  50mg  BID. Pt reports this plan is only for the winter for her Raynaud's and that in the spring/summer she takes metroprolol tartrate 25mg  BID. She states that she is unable to tolerate labetalol  during the summer due to her dysautonomia.   Advised pt that we will need an order from Dr. Marven Slimmer to refill metoprolol  tartrate as it is not currently on her medication list. She has one more tablet and will take this tonight. Will plan to review with Dr. Marven Slimmer tomorrow morning and call patient with an update.   Pt expressed concern about her BP in the meantime as she will miss her AM dose of metoprolol  tomorrow. She has not checked BP today as she has been so anxious about this situation. Provided reassurance and advised pt to avoid triggers (hot shower) in the morning when she reports her autonomic symptoms are at their worst. ED precautions reviewed for new or worsening symptoms.  Pt states that it's time to schedule her annual brain MRI. She has [employer] leads and a [employer] PPM. Dr. Rodolfo Clan previously went to the hospital with [employer] rep to perform scan. Hx of allergic reaction to gadobutrol  contrast, so she must pre-medicate. Typically requires several weeks of planning. Advised pt that I will follow up with Dr. Marven Slimmer regarding this concern, as well.  Pt asked if she should be seen by Dr. Marven Slimmer sooner than October (per recall). Will defer to provider.  Spent ~35 minutes on the phone with patient.

## 2023-08-13 NOTE — Telephone Encounter (Signed)
 Spoke with patient and she is aware we are waiting for provider response

## 2023-08-13 NOTE — Telephone Encounter (Signed)
 Patient is calling to follow up. Patient is requesting a call back asap in regard to this. Patient is requesting if she does not answer the first time to call again. Please advise.

## 2023-08-13 NOTE — Telephone Encounter (Signed)
 Patient called back to follow up on refill. Spoke to [employer], RN and she is going to try and speak with DOD in regards to refill beings that patient will be out tomorrow. She will call patient back. FYI

## 2023-08-14 MED ORDER — METOPROLOL TARTRATE 25 MG PO TABS: 25.0000 mg | ORAL_TABLET | Freq: Two times a day (BID) | ORAL | 3 refills | Status: AC

## 2023-08-14 NOTE — Telephone Encounter (Signed)
 Spoke w/ patient - she is scheduled with Dr. Marven Slimmer on 7/16 for her 6 month follow up and to establish care as a former Dr. Rodolfo Clan patient. Patient was appreciative of the call and scheduling her quickly.

## 2023-08-14 NOTE — Addendum Note (Signed)
 Addended by: Torrion Witter C on: 08/14/2023 11:16 AM   Modules accepted: Orders

## 2023-08-14 NOTE — Telephone Encounter (Signed)
 Discussed with Dr. Marven Slimmer. Received verbal order to discontinue labetalol  and refill metoprolol  tartrate 25mg  BID. Per Dr. Marven Slimmer, will be unable to perform MRI at [facility] as pt has a [employer] PPM and [facility] leads. Pt would likely be able to have MRI performed at [facility] or [facility]. Willing to refer to these practices if patient is interested in this. Will also plan to schedule sooner follow-up (July/August) to discuss pt's concerns in person.  Spoke with patient to relay the above information. Metoprolol  prescription sent electronically to preferred pharmacy and med list updated to reflect discontinuation of labetalol . Pt agreed to discuss labetalol  vs. metoprolol  with Dr. Marven Slimmer at her next OV.  Pt states she is unable to travel to see Dr. Malka Sea in [place] (as recommended by Dr. Rodolfo Clan) due to hx of brain surgery. She states she will not go to [facility] for care under any circumstances. She currently sees an oncologist at [facility], but her neurologist is through [facility] and she does not wish to transfer her care. Pt states she is so anxious about the [facility] system and she has a lack of confidence in the system, especially the ED. She states that she understands that Dr. Rodolfo Clan went above and beyond but that [facility] has put her in a difficult situation by not being able to perform her annual MRIs for metastatic melanoma. Attempted to explain that [facility] does not have a program/protocol in place to regularly perform off-label MRIs. Discussed MR-conditional requirements from a device perspective. Pt requested to review the data that supports this decision. Explained that I do not have that information available and encouraged her to discuss with Dr. Marven Slimmer at her next OV. Pt verbalized understanding and stated that it was clear this was above my decision-making level.  Pt is requesting a call with [facility] leadership to discuss her concerns about the gaps in care that will arise following Dr. Doyle Generous retirement. She is aware that this  message will be reviewed with leadership team, including Meri Stammer, Clinical Nurse Manager, and Patt Boozer, Operations Manager. Pt thanked me for my time and agreed to call if any issues picking up her metoprolol  prescription.  Spent ~40 minutes on the phone with patient.

## 2023-08-19 ENCOUNTER — Other Ambulatory Visit: Payer: Self-pay

## 2023-08-19 ENCOUNTER — Ambulatory Visit (INDEPENDENT_AMBULATORY_CARE_PROVIDER_SITE_OTHER): Admitting: Sports Medicine

## 2023-08-19 ENCOUNTER — Encounter: Payer: Self-pay | Admitting: Sports Medicine

## 2023-08-19 DIAGNOSIS — M7632 Iliotibial band syndrome, left leg: Secondary | ICD-10-CM | POA: Diagnosis not present

## 2023-08-19 DIAGNOSIS — R2681 Unsteadiness on feet: Secondary | ICD-10-CM | POA: Diagnosis not present

## 2023-08-19 DIAGNOSIS — M2170 Unequal limb length (acquired), unspecified site: Secondary | ICD-10-CM

## 2023-08-19 DIAGNOSIS — M7062 Trochanteric bursitis, left hip: Secondary | ICD-10-CM | POA: Diagnosis not present

## 2023-08-19 NOTE — Progress Notes (Signed)
 Patient says that she has had left lateral hip pain for awhile, that has gotten especially bad in the last couple of weeks. She likes to walk and wants to continue to walk as it is her main form of activity. She says that two weeks ago, she walked 4 miles on a treadmill which flared up her pain. She says that in physical therapy in the past, she was told that her pain was through her IT band. She is here today for possible injection, and to discuss her options, as she says she cannot give up walking. She is also unable to lay on the left side in bed due to pain.

## 2023-08-19 NOTE — Progress Notes (Signed)
 [name] - 68 y.o. female MRN [id number]  Date of birth: Oct 27, 1955  Office Visit Note: Visit Date: 08/19/2023 PCP: Charlett Apolinar POUR, MD Referred by: Charlett Apolinar POUR, MD  Subjective: Chief Complaint  Patient presents with   Left Hip - Pain   HPI: [name] is a pleasant 68 y.o. female who presents today for chronic left lateral hip pain and gait instability.  She has dealt with chronic left lateral hip pain as well as a degree of IT band syndrome in the past, but has had an exacerbation of her pain over the last few weeks.  She has discussed this with Dr. Vernetta, who did replace her right hip after AVN, and he recommended evaluation here for soft tissue treatments or possible injection.  She is a very active individual and does walk frequently for exercise.  2 weeks ago she walked about 4 miles which really flared her pain.  She also has pain with laying on the left side.   She also is reporting some gait instability and some weakness of the legs, with transitioning positions or getting up from the ground.  She denies any dizziness of the head.  Pertinent ROS were reviewed with the patient and found to be negative unless otherwise specified above in HPI.   Assessment & Plan: Visit Diagnoses:  1. Trochanteric bursitis, left hip   2. Leg length difference, acquired   3. It band syndrome, left   4. Gait instability    Plan: Impression is chronic left lateral hip pain which does have evidence of trochanteric bursitis in the setting of an acquired leg length discrepancy after right hip total arthroplasty.  She also has a degree of proximal IT band syndrome.  For this and her hip, we did proceed with a trial of extracorporeal shockwave therapy, patient tolerated well.  I would like to bring her back for 1-2 additional treatments about 1 week apart to see what sort of cumulative benefit she has going forward.  We also will get her started in formalized physical therapy to work  on the hips, help with her leg length discrepancy and work on her proximal leg muscles given her gait stability and reported weakness with transitioning.  She may use over-the-counter anti-inflammatories as needed.  I will see her back next week for repeat shockwave and further evaluation.  Follow-up: Return in about 1 week (around 08/26/2023) for make 2 appts 1-week apart Left lateral hip (reg visit).   Meds & Orders: No orders of the defined types were placed in this encounter.   Orders Placed This Encounter  Procedures   US  Guided Needle Placement - No Linked Charges   Ambulatory referral to Physical Therapy    Procedures:  Procedure: ECSWT Indications:  Trochanteric Bursititis/GTPS   Procedure Details Consent: Risks of procedure as well as the alternatives and risks of each were explained to the patient.  Verbal consent for procedure obtained. Time Out: Verified patient identification, verified procedure, site was marked, verified correct patient position. The area was cleaned with alcohol swab.     The Left Greater Trochanteric Region was targeted for Extracorporeal shockwave therapy.    Preset: Trochanteric Bursitis Power Level: 100 mJ Frequency: 10 Hz Impulse/cycles: 2500 Head size: Regular   Patient tolerated procedure well without immediate complications.       Clinical History: No specialty comments available.  She reports that she has never smoked. She has never used smokeless tobacco.  Recent Labs  06/27/23 1154  HGBA1C 5.5    Objective:    Physical Exam  Gen: Well-appearing, in no acute distress; non-toxic CV: Well-perfused. Warm.  Resp: Breathing unlabored on room air; no wheezing. Psych: Fluid speech in conversation; appropriate affect; normal thought process  Ortho Exam - Left hip: + TTP over the trochanteric bursa, there is a degree of soft tissue swelling here without significant warmth or redness.  There is very mildly diminished hip abduction  weakness compared to the contralateral side which is full.  Internal and external log Daig moves smoothly without restriction.  Negative FADIR and FABER testing.  - Gait/leg length: There is about a 1.5 cm leg length discrepancy when laying the patient supine.  The left leg is the functional short leg.  Imaging:  *Independent review and interpretation of bilateral hip x-ray from 10/08/2022 as well as AP pelvis 12//24 was performed by myself today.  Right hip shows a well-seated total hip arthroplasty.  There is evidence of slight superior migration compared to the contralateral left hip that is likely predisposing to her leg length discrepancy.  Past Medical/Family/Surgical/Social History: Medications & Allergies reviewed per EMR, new medications updated. Patient Active Problem List   Diagnosis Date Noted   Acute bacterial bronchitis 04/09/2023   Status post total replacement of right hip 05/18/2022   Avascular necrosis of bone of right hip (HCC) 04/19/2022   Moderate persistent asthma 12/08/2018   Perennial allergic rhinitis with predominantly nonallergic component 12/08/2018   Allergic conjunctivitis 12/08/2018   History of epistaxis 12/08/2018   Atopic dermatitis 12/08/2018   Recurrent urticaria 12/08/2018   Skeeter syndrome 12/08/2018   Skin lesion of chest wall 07/22/2018   S/P laparoscopic cholecystectomy 03/28/2018   Dehydration 05/18/2016   Type 2 diabetes mellitus without complication, without long-term current use of insulin  (HCC) 11/01/2015   History of pacemaker    POTS (postural orthostatic tachycardia syndrome)    Iron deficiency anemia 11/11/2014   Chronic colitis 08/28/2013   Malignant melanoma, metastatic (HCC) 02/09/2013   Malignant melanoma (HCC) 12/01/2012   Malignant neoplasm metastatic to brain (HCC) 10/16/2012   Hypertension 09/30/2012   Anxiety state 09/30/2012   Hypoxemia 09/30/2012   Disorder of brain 09/08/2012   Dysautonomia (HCC) 02/25/2011    Anticoagulant long-term use 01/19/2011   Neuritis 01/19/2011   Skin change 01/19/2011   Cerebral infarction (HCC)    Palpitation 06/28/2010   Pacemaker-Biotronik 06/28/2010   Dizziness and giddiness 04/27/2010   DIVERTICULOSIS, COLON, HX OF 01/19/2009   CAROTID SINUS SYNDROME 11/17/2008   OTHER MALAISE AND FATIGUE 11/17/2008   HYPERTENSION, BENIGN 11/02/2008   ATRIAL TACHYCARDIA 09/16/2008   SYNCOPE, HX OF 08/09/2008   INJURY UNSPEC NERVE SHOULDER GIRDLE&UPPER LIMB 07/12/2008   PERSONAL HISTORY OF MALIGNANT MELANOMA OF SKIN 07/12/2008   LUQ PAIN 12/08/2007   NECK PAIN 09/26/2007   NUMBNESS, ARM 09/26/2007   CPK, ABNORMAL 09/26/2007   ARM PAIN, LEFT 06/25/2007   SWELLING OF LIMB 06/25/2007   OSTEOPENIA 06/25/2007   Past Medical History:  Diagnosis Date   Anemia    Angio-edema    Anxiety    Arthritis    Asthma    Atrial fibrillation (HCC)    Atrial tachycardia (HCC)    Autonomic dysfunction    Complication of anesthesia    pt states wakes up with shakes   CVA (cerebral infarction)    2012 with dizziness and vision change felt embolic from atrial tachy   Disorder of bone and cartilage, unspecified    Diverticulosis  DM (diabetes mellitus) (HCC)    DVT (deep venous thrombosis) (HCC)    Eczema    Family history of anesthesia complication    PONV and  shaking    Fatty liver    GERD (gastroesophageal reflux disease)    History of hiatal hernia    History of radiation therapy 10/24/2012   brain   HLD (hyperlipidemia)    HTN (hypertension)    Hypoglycemia, unspecified    Liver metastasis    Lung metastasis    Metastasis to brain (HCC)    Osteopenia    Other nonspecific abnormal serum enzyme levels    Other specified congenital anomalies of nervous system    Pacemaker    autonomic dysfunction   Pancreatitis    from therapy   Peripheral vascular disease (HCC)    POTS (postural orthostatic tachycardia syndrome)    Raynaud's disease    due to chemo   Skin  cancer    Hx: of lung lesion   Family History  Problem Relation Age of Onset   Allergic rhinitis Sister    Stroke Mother    Colon polyps Mother    Colon cancer Mother    Urticaria Mother    Allergic rhinitis Mother    Heart disease Father    Diabetes Father    Kidney disease Father    Diabetes Maternal Grandmother    Clotting disorder Maternal Grandmother        stroke   Crohn's disease Maternal Grandmother    Diabetes Paternal Grandmother    Aneurysm Sister        brain   Past Surgical History:  Procedure Laterality Date   ABLATION SAPHENOUS VEIN W/ RFA     2002 x3   CARPAL TUNNEL RELEASE Left 2000   CHOLECYSTECTOMY N/A 03/28/2018   Procedure: LAPAROSCOPIC CHOLECYSTECTOMY WITH INTRAOPERATIVE CHOLANGIOGRAM;  Surgeon: Gladis Cough, MD;  Location: [facility];  Service: General;  Laterality: N/A;   CRANIOTOMY N/A 09/30/2012   suboccipital craniectomy   CRANIOTOMY Left 02/09/2013   Procedure: LEFT PARIETAL CRANIOTOMY with stealth;  Surgeon: Victory Gens, MD;  Location: [facility];  Service: Neurosurgery;  Laterality: Left;  LEFT Parietal Craniotomy for tumor with stealth   DEEP AXILLARY SENTINEL NODE BIOPSY / EXCISION     due to extensive Melanoma-right arm   fatty tumor removed  2000   from chest   INSERT / REPLACE / REMOVE PACEMAKER  2012   1999, x 3   LAPAROSCOPY  09/06/2011   Procedure: LAPAROSCOPY OPERATIVE;  Surgeon: Burnard A. Kandyce, MD;  Location: [facility] ORS;  Service: Gynecology;  Laterality: Left;  with Left Ovarian Cystectomy    MELANOMA EXCISION     with removal of lymph nodes, left shoulder   NOSE SURGERY  2010, 2012   for nose bleeds x 2   SINOSCOPY     TONSILLECTOMY AND ADENOIDECTOMY     TOTAL HIP ARTHROPLASTY Right 05/18/2022   Procedure: RIGHT TOTAL HIP ARTHROPLASTY ANTERIOR APPROACH;  Surgeon: Vernetta Lonni GRADE, MD;  Location: [facility];  Service: Orthopedics;  Laterality: Right;   Social History   Occupational History   Occupation: PRESIDENT     Employer: [employer]    Comment: self employed  Tobacco Use   Smoking status: Never   Smokeless tobacco: Never  Vaping Use   Vaping status: Never Used  Substance and Sexual Activity   Alcohol use: Never    Comment: glass of wine daily   Drug use: No  Sexual activity: Not on file

## 2023-08-20 ENCOUNTER — Ambulatory Visit: Admitting: Family Medicine

## 2023-08-20 NOTE — Progress Notes (Deleted)
 PATIENT CHECK-IN and HEALTH RISK ASSESSMENT QUESTIONNAIRE:  -completed by phone/video for upcoming Medicare Preventive Visit  -PLEASE SELECT NOT IN PERSON for the method of visit.   FIRST check to see if the patient completed the online questionnaire - if so, this can be found under the rooming tab, then go to the questionnaires tab. Some of the questions are the same and you can use the answers to complete all of the bold questions below before calling the patient. Question #s below: 6 (fall risk screening), under habits # 1, 2, 3, 8 and 9,  under everyday activities #2, 3 and 8.   Pre-Visit Check-in: 1)Vitals (height, wt, BP, etc) - record in vitals section for visit on day of visit Request home vitals (wt, BP, etc.) and enter into vitals, THEN update Vital Signs SmartPhrase below at the top of the HPI. See below.  2)Review and Update Medications, Allergies PMH, Surgeries, Social history in Epic 3)Hospitalizations in the last year with date/reason? ***  4)Review and Update Care Team (patient's specialists) in Epic 5) Complete PHQ9 in Epic  6) Complete Fall Screening in Epic 7)Review all Health Maintenance Due and order under PCP if not done.  Medicare Wellness Patient Questionnaire:  Answer theses question about your habits: How often do you have a drink containing alcohol?*** How many drinks containing alcohol do you have on a typical day when you are drinking?*** How often do you have six or more drinks on one occasion?*** Have you ever smoked?*** Quit date if applicable? ***  How many packs a day do/did you smoke? *** Do you use smokeless tobacco?*** Do you use an illicit drugs?*** On average, how many days per week do you engage in moderate to strenuous exercise (like a brisk walk)?*** On average, how many minutes do you engage in exercise at this level?*** Are you sexually active? ***Number of partners?*** Typical breakfast**** Typical lunch*** Typical dinner*** Typical  snacks:****  Beverages: ***  Answer theses question about your everyday activities: Can you perform most household chores?*** Are you deaf or have significant trouble hearing?*** Do you feel that you have a problem with memory?*** Do you feel safe at home?*** Last dentist visit?*** 8. Do you have any difficulty performing your everyday activities?*** Are you having any difficulty walking, taking medications on your own, and or difficulty managing daily home needs?*** Do you have difficulty walking or climbing stairs?*** Do you have difficulty dressing or bathing?*** Do you have difficulty doing errands alone such as visiting a doctor's office or shopping?*** Do you currently have any difficulty preparing food and eating?*** Do you currently have any difficulty using the toilet?*** Do you have any difficulty managing your finances?*** Do you have any difficulties with housekeeping of managing your housekeeping?***   Do you have Advanced Directives in place (Living Will, Healthcare Power or Attorney)? ***   Last eye Exam and location?***   Do you currently use prescribed or non-prescribed narcotic or opioid pain medications?***  Do you have a history or close family history of breast, ovarian, tubal or peritoneal cancer or a family member with BRCA (breast cancer susceptibility 1 and 2) gene mutations?  ***Request home vitals (wt, BP, etc.) and enter into vitals, THEN update Vital Signs SmartPhrase below at the top of the HPI. See below.   Nurse/Assistant Credentials/time stamp:    ----------------------------------------------------------------------------------------------------------------------------------------------------------------------------------------------------------------------  Because this visit was a virtual/telehealth visit, some criteria may be missing or patient reported. Any vitals not documented were not able to be obtained and  vitals that have been  documented are patient reported.    MEDICARE ANNUAL PREVENTIVE VISIT WITH PROVIDER: (Welcome to Medicare, initial annual wellness or annual wellness exam)  Virtual Visit via Video***Phone Note  I connected with [name] on 08/20/23 by phone *** a video enabled telemedicine application and verified that I am speaking with the correct person using two identifiers.  Location patient: home Location provider:work or home office Persons participating in the virtual visit: patient, provider  Concerns and/or follow up today:   See HM section in Epic for other details of completed HM.    ROS: negative for report of fevers, unintentional weight loss, vision changes, vision loss, hearing loss or change, chest pain, sob, hemoptysis, melena, hematochezia, hematuria, falls, bleeding or bruising, thoughts of suicide or self harm, memory loss  Patient-completed extensive health risk assessment - reviewed and discussed with the patient: See Health Risk Assessment completed with patient prior to the visit either above or in recent phone note. This was reviewed in detailed with the patient today and appropriate recommendations, orders and referrals were placed as needed per Summary below and patient instructions.   Review of Medical History: -PMH, PSH, Family History and current specialty and care providers reviewed and updated and listed below   Patient Care Team: Charlett Apolinar POUR, MD as PCP - General Fernande Elspeth BROCKS, MD as PCP - Electrophysiology (Cardiology) Colon Shove, MD as Consulting Physician (Neurosurgery) Cloretta Arley NOVAK, MD as Consulting Physician (Oncology) Gabriella, Cathlean, MD as Referring Physician (Internal Medicine) Arlana Arnt, MD as Consulting Physician (Otolaryngology) Joshua Sieving, MD as Consulting Physician (Dermatology) Fleeta Smock, Lamar BROCKS, MD as Consulting Physician (Allergy  and Immunology) [name], [facility] (Pharmacist)   Past Medical History:   Diagnosis Date   Anemia    Angio-edema    Anxiety    Arthritis    Asthma    Atrial fibrillation [facility])    Atrial tachycardia (HCC)    Autonomic dysfunction    Complication of anesthesia    pt states wakes up with shakes   CVA (cerebral infarction)    2012 with dizziness and vision change felt embolic from atrial tachy   Disorder of bone and cartilage, unspecified    Diverticulosis    DM (diabetes mellitus) (HCC)    DVT (deep venous thrombosis) (HCC)    Eczema    Family history of anesthesia complication    PONV and  shaking    Fatty liver    GERD (gastroesophageal reflux disease)    History of hiatal hernia    History of radiation therapy 10/24/2012   brain   HLD (hyperlipidemia)    HTN (hypertension)    Hypoglycemia, unspecified    Liver metastasis    Lung metastasis    Metastasis to brain (HCC)    Osteopenia    Other nonspecific abnormal serum enzyme levels    Other specified congenital anomalies of nervous system    Pacemaker    autonomic dysfunction   Pancreatitis    from therapy   Peripheral vascular disease (HCC)    POTS (postural orthostatic tachycardia syndrome)    Raynaud's disease    due to chemo   Skin cancer    Hx: of lung lesion    Past Surgical History:  Procedure Laterality Date   ABLATION SAPHENOUS VEIN W/ RFA     2002 x3   CARPAL TUNNEL RELEASE Left 2000   CHOLECYSTECTOMY N/A 03/28/2018   Procedure: LAPAROSCOPIC CHOLECYSTECTOMY WITH INTRAOPERATIVE CHOLANGIOGRAM;  Surgeon: Gladis,  Donnice, MD;  Location: [facility];  Service: General;  Laterality: N/A;   CRANIOTOMY N/A 09/30/2012   suboccipital craniectomy   CRANIOTOMY Left 02/09/2013   Procedure: LEFT PARIETAL CRANIOTOMY with stealth;  Surgeon: Victory Gens, MD;  Location: [facility] OR[facility];  Service: Neurosurgery;  Laterality: Left;  LEFT Parietal Craniotomy for tumor with stealth   DEEP AXILLARY SENTINEL NODE BIOPSY / EXCISION     due to extensive Melanoma-right arm   fatty tumor removed  2000    from chest   INSERT / REPLACE / REMOVE PACEMAKER  2012   1999, x 3   LAPAROSCOPY  09/06/2011   Procedure: LAPAROSCOPY OPERATIVE;  Surgeon: Burnard A. Kandyce, MD;  Location: [facility] ORS;  Service: Gynecology;  Laterality: Left;  with Left Ovarian Cystectomy    MELANOMA EXCISION     with removal of lymph nodes, left shoulder   NOSE SURGERY  2010, 2012   for nose bleeds x 2   SINOSCOPY     TONSILLECTOMY AND ADENOIDECTOMY     TOTAL HIP ARTHROPLASTY Right 05/18/2022   Procedure: RIGHT TOTAL HIP ARTHROPLASTY ANTERIOR APPROACH;  Surgeon: Vernetta Lonni GRADE, MD;  Location: [facility] ORS;  Service: Orthopedics;  Laterality: Right;    Social History   Socioeconomic History   Marital status: Married    Spouse name: Not on file   Number of children: 0   Years of education: Not on file   Highest education level: Not on file  Occupational History   Occupation: PRESIDENT    Employer: [employer]    Comment: self employed  Tobacco Use   Smoking status: Never   Smokeless tobacco: Never  Vaping Use   Vaping status: Never Used  Substance and Sexual Activity   Alcohol use: Never    Comment: glass of wine daily   Drug use: No   Sexual activity: Not on file  Other Topics Concern   Not on file  Social History Narrative   4 years college hh of 2    Neg tad   Social Drivers of Corporate investment banker Strain: Low Risk  (11/22/2021)   Overall Financial Resource Strain (CARDIA)    Difficulty of Paying Living Expenses: Not very hard  Food Insecurity: No Food Insecurity (06/08/2022)   Hunger Vital Sign    Worried About Running Out of Food in the Last Year: Never true    Ran Out of Food in the Last Year: Never true  Transportation Needs: No Transportation Needs (05/18/2022)   PRAPARE - Administrator, Civil Service (Medical): No    Lack of Transportation (Non-Medical): No  Physical Activity: Sufficiently Active (03/14/2021)   Exercise Vital Sign    Days of Exercise per  Week: 4 days    Minutes of Exercise per Session: 90 min  Stress: No Stress Concern Present (03/14/2021)   [employer] of Occupational Health - Occupational Stress Questionnaire    Feeling of Stress : Not at all  Recent Concern: Stress - Stress Concern Present (12/26/2020)   [employer] of Occupational Health - Occupational Stress Questionnaire    Feeling of Stress : To some extent  Social Connections: Socially Integrated (03/14/2021)   Social Connection and Isolation Panel    Frequency of Communication with Friends and Family: More than three times a week    Frequency of Social Gatherings with Friends and Family: More than three times a week    Attends Religious Services: More than 4 times per year  Active Member of Clubs or Organizations: Yes    Attends Banker Meetings: More than 4 times per year    Marital Status: Married  Catering manager Violence: Not At Risk (05/18/2022)   Humiliation, Afraid, Rape, and Kick questionnaire    Fear of Current or Ex-Partner: No    Emotionally Abused: No    Physically Abused: No    Sexually Abused: No    Family History  Problem Relation Age of Onset   Allergic rhinitis Sister    Stroke Mother    Colon polyps Mother    Colon cancer Mother    Urticaria Mother    Allergic rhinitis Mother    Heart disease Father    Diabetes Father    Kidney disease Father    Diabetes Maternal Grandmother    Clotting disorder Maternal Grandmother        stroke   Crohn's disease Maternal Grandmother    Diabetes Paternal Grandmother    Aneurysm Sister        brain    Current Outpatient Medications on File Prior to Visit  Medication Sig Dispense Refill   acetaminophen  (TYLENOL ) 500 MG tablet Take 500 mg by mouth See admin instructions. Take 500 mg by mouth at bedtime and an additional 500 mg three times a day as needed for pain     albuterol  (PROVENTIL ) (2.5 MG/3ML) 0.083% nebulizer solution Take 3 mLs (2.5 mg total) by nebulization  every 4 (four) hours as needed for wheezing or shortness of breath. 180 mL 1   albuterol  (VENTOLIN  HFA) 108 (90 Base) MCG/ACT inhaler Inhale 2 puffs into the lungs every 4 (four) hours as needed for wheezing or shortness of breath. 1 each 0   Ascorbic Acid  (VITAMIN C ) 1000 MG tablet Take 2,000 mg by mouth daily.     augmented betamethasone  dipropionate (DIPROLENE ) 0.05 % ointment Apply topically 2 (two) times daily. Limit to no longer than 2 weeks use. Not on face. (Patient taking differently: Apply 1 Application topically 2 (two) times daily as needed (hives). Limit to no longer than 2 weeks use. Not on face.) 50 g 0   budesonide  (ENTOCORT EC ) 3 MG 24 hr capsule Take 3 mg by mouth as needed for rash.     calcium  carbonate (TUMS EX) 750 MG chewable tablet Chew 1 tablet by mouth daily.     cephALEXin  (KEFLEX ) 500 MG capsule Take 1 capsule (500 mg total) by mouth 2 (two) times daily. (Patient not taking: Reported on 07/09/2023) 14 capsule 0   clobetasol cream (TEMOVATE) 0.05 % Apply 1 application topically 2 (two) times daily as needed (rash).     clonazePAM  (KLONOPIN ) 1 MG tablet TAKE ONE TABLET BY MOUTH 2-3 TIMES A DAY AS NEEDED 90 tablet 2   cloNIDine  (CATAPRES ) 0.1 MG tablet TAKE 1 TABLET BY MOUTH EVERY NIGHT AT BEDTIME 90 tablet 2   cyanocobalamin  (VITAMIN B12) 1000 MCG/ML injection INJECT 1ML INTRAMUSCULARLY EVERY 30 DAYS (Patient not taking: Reported on 07/09/2023) 3 mL 4   Desoximetasone  (TOPICORT ) 0.25 % ointment Apply 1 application topically 2 (two) times daily. (Patient taking differently: Apply 1 application  topically 2 (two) times daily as needed (rash).) 30 g 1   diphenhydrAMINE  (BENADRYL ) 25 MG tablet Take 25-50 mg by mouth daily as needed for allergies.     Dupilumab (DUPIXENT) 300 MG/2ML SOAJ Inject into the skin.     EPINEPHrine  0.3 mg/0.3 mL IJ SOAJ injection Inject 0.3 mg into the muscle as needed for anaphylaxis.  fexofenadine (ALLEGRA) 180 MG tablet Take 180 mg by mouth daily as  needed for allergies or rhinitis.     Fluocinonide 0.1 % CREA Apply 1 application  topically 2 (two) times daily as needed. On affected areas     fluticasone  (FLONASE ) 50 MCG/ACT nasal spray Place 2 sprays into both nostrils daily. (Patient taking differently: Place 2 sprays into both nostrils daily as needed for allergies.) 16 g 0   furosemide  (LASIX ) 20 MG tablet TAKE 1 TABLET BY MOUTH DAILY AS NEEDED 90 tablet 3   HYDROcodone -acetaminophen  (NORCO/VICODIN) 5-325 MG tablet Take 1-2 tablets by mouth every 6 (six) hours as needed for moderate pain. 30 tablet 0   hydrocortisone  (ANUSOL -HC) 25 MG suppository Place 25 mg rectally as needed for hemorrhoids.     ibuprofen  (ADVIL ) 800 MG tablet Take 1 tablet (800 mg total) by mouth every 8 (eight) hours as needed. 60 tablet 3   levETIRAcetam  (KEPPRA ) 250 MG tablet Take 250 mg by mouth See admin instructions. Take 250 mg by mouth at 8 AM, 2 PM, and 8 PM     levocetirizine (XYZAL ) 5 MG tablet TAKE 1 TABLET BY MOUTH EVERY DAY AS NEEDED 30 tablet 0   lidocaine  (XYLOCAINE ) 2 % solution 5mL swish and swallow as needed for sore throat; take with nystatin  susp 200 mL 0   methocarbamol  (ROBAXIN ) 500 MG tablet Take 1 tablet (500 mg total) by mouth every 6 (six) hours as needed for muscle spasms. 40 tablet 1   metoprolol  tartrate (LOPRESSOR ) 25 MG tablet Take 1 tablet (25 mg total) by mouth 2 (two) times daily. 180 tablet 3   nystatin  (MYCOSTATIN ) 100000 UNIT/ML suspension Use as directed 3 mLs (300,000 Units total) in the mouth or throat 3 (three) times daily as needed (thrush). 60 mL 4   Olopatadine  HCl (PATADAY ) 0.2 % SOLN Use one drop in each eye once daily as needed. (Patient taking differently: Place 1 drop into both eyes daily as needed (itchy eyes).) 2.5 mL 5   ondansetron  (ZOFRAN -ODT) 4 MG disintegrating tablet Take 1-2 tablets (4-8 mg total) by mouth every 8 (eight) hours as needed. 20 tablet 0   polyethylene glycol (MIRALAX  / GLYCOLAX ) packet Take 17 g by  mouth at bedtime.     potassium chloride  SA (KLOR-CON  M) 20 MEQ tablet Take 1 tablet (20 mEq total) by mouth 2 (two) times daily. On days when taking furosemide  60 tablet 3   Probiotic Product (PROBIOTIC PO) Take 2 capsules by mouth daily.     prochlorperazine  (COMPAZINE ) 10 MG tablet Take 1 tablet (10 mg total) by mouth every 6 (six) hours as needed for nausea or vomiting. 60 tablet 0   sodium chloride  (OCEAN) 0.65 % SOLN nasal spray Place 1 spray into both nostrils as needed.     Spacer/Aero-Holding Chambers [facility] DIAMOND) MISC See admin instructions. PRN     Syringe/Needle, Disp, (SYRINGE 3CC/27GX1-1/4) 27G X 1-1/4 3 ML MISC Inject 1 mL into the muscle every 30 (thirty) days. Vit B12 (Patient taking differently: Inject 1 mL into the muscle every 30 (thirty) days. Vit B12. To discuss with provider for B12) 12 each 0   tiZANidine  (ZANAFLEX ) 2 MG tablet Take 1 tablet (2 mg total) by mouth every 6 (six) hours as needed for muscle spasms. (Patient not taking: Reported on 07/09/2023) 30 tablet 0   Vitamin D , Ergocalciferol , (DRISDOL ) 50000 units CAPS capsule TAKE 1 CAPSULE (50,000 UNITS TOTAL) BY MOUTH EVERY 7 (SEVEN) DAYS. (Patient taking differently: Take 50,000  Units by mouth every Saturday.) 4 capsule 11   Zinc  50 MG TABS Take 50 mg by mouth daily.     Current Facility-Administered Medications on File Prior to Visit  Medication Dose Route Frequency Provider Last Rate Last Admin   mupirocin  ointment (BACTROBAN ) 2 %   Topical Daily Vernetta Lonni GRADE, MD        Allergies  Allergen Reactions   Bee Venom Swelling   Doxycycline  Hives   Erythromycin Hives   Gadavist  [Gadobutrol ] Hives, Shortness Of Breath and Itching   Paxlovid  [Nirmatrelvir -Ritonavir ] Swelling and Other (See Comments)    Mouth swelling, mouth ulcers   Penicillins Hives    Did it involve swelling of the face/tongue/throat, SOB, or low BP? No Did it involve sudden or severe rash/hives, skin peeling, or any reaction  on the inside of your mouth or nose? Yes Did you need to seek medical attention at a hospital or doctor's office? Yes When did it last happen?      5+ years If all above answers are "NO", may proceed with cephalosporin use.    Sulfa Antibiotics Hives   Vancomycin  Hives, Rash and Other (See Comments)    Develops Red Man Syndrome and welts/hives if it is run too quickly via IV- must be run slowly   Preparation H [Pramox-Pe-Glycerin-Petrolatum] Hives    Cannot use  Cream or Suppositories    Phenergan  [Promethazine  Hcl] Other (See Comments)    Causes restless leg syndrome       Physical Exam Vitals requested from patient and listed below if patient had equipment and was able to obtain at home for this virtual visit: There were no vitals filed for this visit. Estimated body mass index is 25.24 kg/m as calculated from the following:   Height as of 07/09/23: 5' 3 (1.6 m).   Weight as of 07/09/23: 142 lb 8 oz (64.6 kg).  EKG (optional): deferred due to virtual visit  GENERAL: alert, oriented, no acute distress detected, full vision exam deferred due to pandemic and/or virtual encounter  *** HEENT: atraumatic, conjunttiva clear, no obvious abnormalities on inspection of external nose and ears  NECK: normal movements of the head and neck  LUNGS: on inspection no signs of respiratory distress, breathing rate appears normal, no obvious gross SOB, gasping or wheezing  CV: no obvious cyanosis  MS: moves all visible extremities without noticeable abnormality  PSYCH/NEURO: pleasant and cooperative, no obvious depression or anxiety, speech and thought processing grossly intact, Cognitive function grossly intact        03/15/2023    1:14 PM 03/15/2022   11:20 AM 03/14/2021    1:09 PM 03/22/2020   11:39 AM 03/15/2020    2:48 PM  Depression screen PHQ 2/9  Decreased Interest 0 0 0 0 0  Down, Depressed, Hopeless 0  0 0 0  PHQ - 2 Score 0 0 0 0 0       03/22/2020   11:44 AM 08/02/2020     6:21 PM 10/31/2020   11:46 AM 03/14/2021    1:12 PM 03/15/2022   11:20 AM  Fall Risk  Falls in the past year?    0 0  Was there an injury with Fall? 0   0 0  Fall Risk Category Calculator    0 0  Fall Risk Category (Retired)    Low    (RETIRED) Patient Fall Risk Level  Low fall risk  Low fall risk  Low fall risk    Patient at  Risk for Falls Due to Impaired balance/gait;Impaired mobility;Impaired vision    No Fall Risks  Fall risk Follow up     Falls evaluation completed      Data saved with a previous flowsheet row definition     SUMMARY AND PLAN:  No diagnosis found.  Visit coding *** [phone] (annual wellness visit -initial); G0439 (annual wellness subsequent); G0402 Welcome to Medicare(initial preventive physical exam)   Discussed applicable health maintenance/preventive health measures and advised and referred or ordered per patient preferences:  Health Maintenance  Topic Date Due   FOOT EXAM  Never done   OPHTHALMOLOGY EXAM  Never done   HIV Screening  Never done   Hepatitis C Screening  Never done   DTaP/Tdap/Td (1 - Tdap) Never done   Cervical Cancer Screening (HPV/Pap Cotest)  Never done   MAMMOGRAM  11/26/2010   Pneumococcal Vaccine: 50+ Years (3 of 3 - PCV) 02/07/2021   DEXA SCAN  Never done   Medicare Annual Wellness (AWV)  03/16/2023   Diabetic kidney evaluation - Urine ACR  03/17/2027 (Originally 12/30/1973)   Zoster Vaccines- Shingrix (1 of 2) 06/14/2027 (Originally 12/29/1974)   INFLUENZA VACCINE  09/27/2023   HEMOGLOBIN A1C  12/28/2023   Diabetic kidney evaluation - eGFR measurement  06/26/2024   Colonoscopy  10/21/2030   Hepatitis B Vaccines  Aged Out   HPV VACCINES  Aged Out   Meningococcal B Vaccine  Aged Out   COVID-19 Vaccine  Discontinued      Education and counseling on the following was provided based on the above review of health and a plan/checklist for the patient, along with additional information discussed, was provided for the patient in  the patient instructions :  -Advised on importance of completing advanced directives, discussed options for completing and provided information in patient instructions as well -Provided counseling and plan for difficulty hearing  -Provided counseling and plan for increased risk of falling if applicable per above screening. Reviewed and demonstrated safe balance exercises that can be done at home to improve balance and discussed exercise guidelines for adults with include balance exercises at least 3 days per week.  -Advised and counseled on a healthy lifestyle - including the importance of a healthy diet, regular physical activity, social connections and stress management. -Reviewed patient's current diet. Advised and counseled on a whole foods based healthy diet. A summary of a healthy diet was provided in the Patient Instructions.  -reviewed patient's current physical activity level and discussed exercise guidelines for adults. Discussed community resources and ideas for safe exercise at home to assist in meeting exercise guideline recommendations in a safe and healthy way.  -Advise yearly dental visits at minimum and regular eye exams -Advised and counseled on alcohol safe limits, risks/ tobacco use, risks of smoking and offered counseling/help, drug, opoid use/misuse   Follow up: see patient instructions     There are no Patient Instructions on file for this visit.  Chiquita JONELLE Cramp, DO

## 2023-08-21 ENCOUNTER — Encounter (HOSPITAL_COMMUNITY): Payer: Self-pay | Admitting: Radiology

## 2023-08-23 ENCOUNTER — Telehealth: Payer: Self-pay

## 2023-08-23 ENCOUNTER — Telehealth: Payer: Self-pay | Admitting: Internal Medicine

## 2023-08-23 DIAGNOSIS — F419 Anxiety disorder, unspecified: Secondary | ICD-10-CM

## 2023-08-23 NOTE — Telephone Encounter (Signed)
 Copied from CRM [id number]. Topic: Clinical - Medication Refill >> Aug 23, 2023  8:07 AM Elle L wrote: Medication: clonazePAM  (KLONOPIN ) 1 MG tablet  Has the patient contacted their pharmacy? Yes  This is the patient's preferred pharmacy:  [id number] - [place], [place] - [address] [name] [address] Phone: [phone] Fax: [phone]  Is this the correct pharmacy for this prescription? Yes  Has the prescription been filled recently? Yes  Is the patient out of the medication? Yes, 1 tablet left.   Has the patient been seen for an appointment in the last year OR does the patient have an upcoming appointment? Yes  Can we respond through MyChart? No, the patient is requesting a call back if needed.   Agent: Please be advised that Rx refills may take up to 3 business days. We ask that you follow-up with your pharmacy.

## 2023-08-23 NOTE — Telephone Encounter (Signed)
 Pt call regarding to her Rx clonazepam . She states her pharmacy denied a refill due too soon for a refill. She continues she needs medication and only has 1 pill left.  Inform pt we declined the refill because she should have one refill left. Offer pt we can contact her pharmacy. Pt request to put her on hold while this cma talk to her pharmacy. Place pt on hold.  Spoke to Abby with [facility]. She states they do have a refill left and can fill it for pt today. They will fill for 90 tablets. Relay message to pt.  Pt verbalized understanding.   Pt states she often has this issues and wants to know what she can do. Advise pt if she has issues with her medication refill, she can send us  a mychart message on the Rx, we can look into her med and send in request if needed.    Pt ask who handle medication at our office. Inform pt, we have a clinical pharmacist, Jon Lindau. Pt request to be seen with Jon Lindau to help her with medication. She states she is on a lot of medication and some of them doesn't work as it should.   Please advise.

## 2023-08-26 ENCOUNTER — Ambulatory Visit: Admitting: Sports Medicine

## 2023-08-26 ENCOUNTER — Telehealth: Payer: Self-pay | Admitting: Internal Medicine

## 2023-08-26 NOTE — Telephone Encounter (Signed)
 Appt on schedule for 7/7 for med review

## 2023-08-26 NOTE — Telephone Encounter (Signed)
 Patient stated she is directly returning RN [facility] call.

## 2023-08-26 NOTE — Telephone Encounter (Signed)
 Routing this to the correct person

## 2023-08-26 NOTE — Telephone Encounter (Signed)
 Noted

## 2023-08-27 NOTE — Telephone Encounter (Signed)
 Patient says she is returning a call to [name], Charity fundraiser. She is very upset that she hasn't received a call back yet. Please advise.

## 2023-08-28 ENCOUNTER — Other Ambulatory Visit (HOSPITAL_COMMUNITY): Payer: Self-pay | Admitting: Neurological Surgery

## 2023-08-28 DIAGNOSIS — C7931 Secondary malignant neoplasm of brain: Secondary | ICD-10-CM

## 2023-09-02 ENCOUNTER — Other Ambulatory Visit (INDEPENDENT_AMBULATORY_CARE_PROVIDER_SITE_OTHER): Payer: Medicare Other

## 2023-09-02 ENCOUNTER — Encounter: Payer: Self-pay | Admitting: Sports Medicine

## 2023-09-02 ENCOUNTER — Ambulatory Visit (INDEPENDENT_AMBULATORY_CARE_PROVIDER_SITE_OTHER): Admitting: Sports Medicine

## 2023-09-02 DIAGNOSIS — M7062 Trochanteric bursitis, left hip: Secondary | ICD-10-CM

## 2023-09-02 DIAGNOSIS — M7632 Iliotibial band syndrome, left leg: Secondary | ICD-10-CM

## 2023-09-02 DIAGNOSIS — Z79899 Other long term (current) drug therapy: Secondary | ICD-10-CM

## 2023-09-02 NOTE — Telephone Encounter (Signed)
 Have followed up with patient and documentation lives in in duplicative phone message.   Lonni Glendia Richards RN, BSN, CCRN-Alumnus

## 2023-09-02 NOTE — Progress Notes (Signed)
 09/02/2023 Name: [name] MRN: [id number] DOB: 12-11-1955  Chief Complaint  Patient presents with   Medication Management    Kimberlye L Barrera is a 68 y.o. year old female who presented for a telephone visit.   They were referred to the pharmacist by their PCP for assistance in managing complex medication management.    Subjective:  Care Team: Primary Care Provider: Charlett Apolinar POUR, MD   Medication Access/Adherence  Current Pharmacy:  [id number] - [place], [place] - [address] [place] [address] Phone: [phone] Fax: [phone]  [id number] [place] [name] - [name] [place] [address] Phone: [phone] Fax: [phone]  [place] [facility] [address] [name] [address] Phone: [phone] Fax: [phone]  [facility] [place] [facility] [address] [name] [address] Phone: [phone] Fax: [phone]   Patient reports affordability concerns with their medications: No  Patient reports access/transportation concerns to their pharmacy: No  Patient reports adherence concerns with their medications:  No     Medication Management:  Current adherence strategy: Keeps her medication in a chest in alphabetical order. Does not want to use a daily pill organizer due to so many prn meds  Patient reports Good adherence to medications  Patient has concerns with running out of clonazepam  as pharmacy would not fill it last month. Unclear what the issue was at that time. Wants follow up with pharmacy to help avoid future dispensing issues.  Has follow up with cardio to discuss labetalol  vs metoprolol  later this month  Objective:  Lab Results  Component Value Date   HGBA1C 5.5 06/27/2023    Lab Results  Component Value Date   CREATININE 0.87 06/27/2023   BUN 13 06/27/2023    NA 136 06/27/2023   K 3.5 06/27/2023   CL 100 06/27/2023   CO2 26 06/27/2023    Lab Results  Component Value Date   CHOL 220 (H) 07/07/2021   HDL 55 07/07/2021   LDLCALC 133 (H) 07/07/2021   LDLDIRECT 129 (H) 07/07/2021   TRIG 179 (H) 07/07/2021   CHOLHDL 4.0 07/07/2021    Medications Reviewed Today     Reviewed by [name], RPH (Pharmacist) on 09/02/23 at 1620  Med List Status: <None>   Medication Order Taking? Sig Documenting Provider Last Dose Status Informant  acetaminophen  (TYLENOL ) 500 MG tablet [id number]  Take 500 mg by mouth See admin instructions. Take 500 mg by mouth at bedtime and an additional 500 mg three times a day as needed for pain [provider]  Active Self           Med Note (MENDOZA MENDEZ, CARLOS A   Tue May 31, 2020  2:15 PM)    albuterol  (PROVENTIL ) (2.5 MG/3ML) 0.083% nebulizer solution [id number]  Take 3 mLs (2.5 mg total) by nebulization every 4 (four) hours as needed for wheezing or shortness of breath. [name], MD  Active   albuterol  (VENTOLIN  HFA) 108 [phone] Base) MCG/ACT inhaler [id number]  Inhale 2 puffs into the lungs every 4 (four) hours as needed for wheezing or shortness of breath. [name], MD  Active   Ascorbic Acid  (VITAMIN C ) 1000 MG tablet [id number]  Take 2,000 mg by mouth daily. [provider]  Active Self  augmented betamethasone  dipropionate (DIPROLENE ) 0.05 % ointment [id number] Yes  Apply topically 2 (two) times daily. Limit to no longer than 2 weeks use. Not on face. Panosh, Apolinar POUR, MD  Active Self  budesonide  (ENTOCORT EC ) 3 MG 24 hr capsule [id number]  Take 3 mg by mouth as needed for rash. [provider]  Active Self  calcium  carbonate (TUMS EX) 750 MG chewable tablet [id number]  Chew 1 tablet by mouth daily. [provider]  Active Self   Patient not taking:   Discontinued 09/02/23 1302 (Completed Course)   clobetasol cream (TEMOVATE) 0.05 % [id number]  Apply 1 application  topically 2 (two) times daily as needed (rash). [provider]  Active Self  clonazePAM  (KLONOPIN ) 1 MG tablet [id number]  TAKE ONE TABLET BY MOUTH 2-3 TIMES A DAY AS NEEDED Panosh, Wanda K, MD  Active   cloNIDine  (CATAPRES ) 0.1 MG tablet [id number]  TAKE 1 TABLET BY MOUTH EVERY NIGHT AT BEDTIME Fernande Elspeth BROCKS, MD  Active   cyanocobalamin  (VITAMIN B12) 1000 MCG/ML injection [id number] Yes INJECT 1ML INTRAMUSCULARLY EVERY 30 DAYS Webb, Padonda B, FNP  Active   Desoximetasone  (TOPICORT ) 0.25 % ointment [id number] Yes Apply 1 application topically 2 (two) times daily. Panosh, Apolinar POUR, MD  Active Self  diphenhydrAMINE  (BENADRYL ) 25 MG tablet [id number]  Take 25-50 mg by mouth daily as needed for allergies. [provider]  Active Self  Dupilumab (DUPIXENT) 300 MG/2ML EMMANUEL [id number]  Inject into the skin. [provider]  Active            Med Note [name], [name] H   Tue Mar 05, 2023  1:20 PM) Every other week. Not started yet, pending insurance approval  EPINEPHrine  0.3 mg/0.3 mL IJ SOAJ injection [id number]  Inject 0.3 mg into the muscle as needed for anaphylaxis. [provider]  Active Self  fexofenadine (ALLEGRA) 180 MG tablet [id number]  Take 180 mg by mouth daily as needed for allergies or rhinitis. [provider]  Active Self  Fluocinonide 0.1 % CREA [id number] Yes Apply 1 application  topically 2 (two) times daily as needed. On affected areas [provider]  Active   fluticasone  (FLONASE ) 50 MCG/ACT nasal spray [id number] Yes Place 2 sprays into both nostrils daily. Leath-WarrenEtta PARAS, NP  Active Self  furosemide  (LASIX ) 20 MG tablet [id number]  TAKE 1 TABLET BY MOUTH DAILY AS NEEDED Fernande Elspeth BROCKS, MD  Active   HYDROcodone -acetaminophen  (NORCO/VICODIN) 5-325 MG tablet [id number]  Take 1-2 tablets by mouth every 6 (six) hours as needed for moderate pain. Vernetta Lonni GRADE, MD  Active   hydrocortisone  (ANUSOL -HC) 25 MG suppository  [id number]  Place 25 mg rectally as needed for hemorrhoids. [provider]  Active Self  ibuprofen  (ADVIL ) 800 MG tablet [id number]  Take 1 tablet (800 mg total) by mouth every 8 (eight) hours as needed. Vernetta Lonni GRADE, MD  Active   levETIRAcetam  (KEPPRA ) 250 MG tablet [id number]  Take 250 mg by mouth See admin instructions. Take 250 mg by mouth at 8 AM, 2 PM, and 8 PM [provider]  Active Self           Med Note ELSWORTH, JACQUELINE L   Tue Jun 27, 2021  3:16 PM)    levocetirizine (XYZAL ) 5 MG tablet [id number]  TAKE 1 TABLET BY MOUTH EVERY DAY AS NEEDED Bobbitt, Elgin Pepper, MD  Active Self  lidocaine  (XYLOCAINE ) 2 % solution [id number]  5mL swish and swallow as needed for sore throat; take with nystatin  susp Vivienne Delon HERO, PA-C  Active  Self  methocarbamol  (ROBAXIN ) 500 MG tablet [id number]  Take 1 tablet (500 mg total) by mouth every 6 (six) hours as needed for muscle spasms. Gretta Bertrum ORN, PA-C  Active   metoprolol  tartrate (LOPRESSOR ) 25 MG tablet [id number] Yes Take 1 tablet (25 mg total) by mouth 2 (two) times daily. Cindie Ole DASEN, MD  Active   mupirocin  ointment (BACTROBAN ) 2 % [id number]   Vernetta Lonni GRADE, MD  Active   nystatin  (MYCOSTATIN ) 100000 UNIT/ML suspension [id number]  Use as directed 3 mLs (300,000 Units total) in the mouth or throat 3 (three) times daily as needed (thrush). Panosh, Apolinar POUR, MD  Active   Olopatadine  HCl (PATADAY ) 0.2 % SOLN [id number] Yes Use one drop in each eye once daily as needed. Bobbitt, Elgin Pepper, MD  Active Self  ondansetron  (ZOFRAN -ODT) 4 MG disintegrating tablet [id number]  Take 1-2 tablets (4-8 mg total) by mouth every 8 (eight) hours as needed. Burnette, Jennifer M, PA-C  Active   polyethylene glycol (MIRALAX  / GLYCOLAX ) packet [id number]  Take 17 g by mouth at bedtime. [provider]  Active Self  potassium chloride  SA (KLOR-CON  M) 20 MEQ tablet [id number]  Take 1 tablet (20 mEq total) by mouth 2  (two) times daily. On days when taking furosemide  Panosh, Wanda K, MD  Active   Probiotic Product (PROBIOTIC PO) [id number]  Take 2 capsules by mouth daily. [provider]  Active Self  prochlorperazine  (COMPAZINE ) 10 MG tablet [id number]  Take 1 tablet (10 mg total) by mouth every 6 (six) hours as needed for nausea or vomiting. Johnny Garnette LABOR, MD  Active Self  sodium chloride  (OCEAN) 0.65 % SOLN nasal spray [id number]  Place 1 spray into both nostrils as needed. [provider]  Active Self  Spacer/Aero-Holding [facility] DIAMOND) MISC [id number]  See admin instructions. PRN [provider]  Active Self  Syringe/Needle, Disp, (SYRINGE 3CC/27GX1-1/4) 27G X 1-1/4 3 ML MISC [id number] Yes Inject 1 mL into the muscle every 30 (thirty) days. Vit B12 Panosh, Wanda K, MD  Active Self  tiZANidine  (ZANAFLEX ) 2 MG tablet [id number] Yes Take 1 tablet (2 mg total) by mouth every 6 (six) hours as needed for muscle spasms. Vernetta Lonni GRADE, MD  Active   Vitamin D , Ergocalciferol , (DRISDOL ) 50000 units CAPS capsule [id number] Yes TAKE 1 CAPSULE (50,000 UNITS TOTAL) BY MOUTH EVERY 7 (SEVEN) DAYS. Fernande Elspeth BROCKS, MD  Active Self  Zinc  50 MG TABS [id number]  Take 50 mg by mouth daily. [provider]  Active Self              Assessment/Plan:   Medication Management: - Currently strategy sufficient to maintain appropriate adherence to prescribed medication regimen - Updated myChart list of medication, indication, and administration time.  -Spoke with [facility], confirmed they have 2 refills on file for clonazepam  from rx sent on 06/27/23. They have a policy to fill controls 1 day early from previous pickup. Noted.      Follow Up Plan: ` month  Jon VEAR Lindau, PharmD Clinical Pharmacist [phone]

## 2023-09-02 NOTE — Telephone Encounter (Signed)
 Closed out multiple days of call with [name] this morning. Shared with her that we have found a physician to monitor her MRI and that we will work to get it scheduled for July 10 or July 16.  Simi shared that 7/16 works best for her and we agreed to focus on that one.  Keith was appreciative and agreeable to the current plan.  Burnard Cha will follow up with her later todayu.  Lonni Glendia Richards RN, BSN, CCRN-Alumnus

## 2023-09-02 NOTE — Progress Notes (Signed)
 Patient says she got about 3-4 days of 90% relief from the first shockwave therapy. She says that she was able to walk 2.5 miles the day after the appointment. In the last couple of days, her pain has been a 7/10, and has been disrupting her sleep. Both of these things had improved during those 3-4 days of good relief. She denies any significant soreness or tenderness after the shockwave therapy.

## 2023-09-02 NOTE — Progress Notes (Signed)
 [name] - 68 y.o. female MRN [id number]  Date of birth: 06/30/1955  Office Visit Note: Visit Date: 09/02/2023 PCP: Charlett Apolinar POUR, MD Referred by: Charlett Apolinar POUR, MD  Subjective: Chief Complaint  Patient presents with   Left Hip - Follow-up   HPI: [name] is a pleasant 68 y.o. female who presents today for chronic left lateral hip pain and gait instability.   A few weeks ago we did perform our first extracorporeal shockwave therapy for the lateral hip/trochanteric bursa and she received about 90% relief of her pain for the first 3 to 4 days, following this then her pain slowly started to wane.  Overall though is still feeling better than previous treatment.  She has had pain over the lateral hip that has been disrupting her sleep the last few days.  She was able to walk up to 2-1/2 miles a day following her first treatment.  She is having to change around a few of her appointments given work up at [facility] for medical purposes.  Pertinent ROS were reviewed with the patient and found to be negative unless otherwise specified above in HPI.   Assessment & Plan: Visit Diagnoses:  1. Trochanteric bursitis, left hip   2. It band syndrome, left    Plan: Impression is chronic left lateral hip pain with evidence of trochanteric bursitis as well as a degree of proximal IT band syndrome.  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  She received very good pain relief initially after her first shockwave treatment.  She did have to cancel her initial follow-up for subsequent treatments.  We did proceed with extracorporeal shockwave therapy today, I would like to see her back in 1 week to see what sort of cumulative if she receives with consecutive treatments.  She will get started in formalized physical therapy, has an upcoming appointment.  After 3 treatments, if she is getting good relief, we consider additional treatments.  Could consider  greater trochanteric injection, but would like to hold for now to try to avoid corticosteroid burden if possible.  Okay for over-the-counter anti-inflammatories as needed.  Follow-up: Return in about 1 week  Meds & Orders: No orders of the defined types were placed in this encounter.  No orders of the defined types were placed in this encounter.    Procedures:  Procedure: ECSWT Indications:  Trochanteric Bursititis/GTPS   Procedure Details Consent: Risks of procedure as well as the alternatives and risks of each were explained to the patient.  Verbal consent for procedure obtained. Time Out: Verified patient identification, verified procedure, site was marked, verified correct patient position. The area was cleaned with alcohol swab.     The Left Greater Trochanteric Region was targeted for Extracorporeal shockwave therapy.    Preset: Trochanteric Bursitis Power Level: 100 mJ Frequency: 10-12 Hz Impulse/cycles: 2500 Head size: Regular   Patient tolerated procedure well without immediate complications.      Clinical History: No specialty comments available.  She reports that she has never smoked. She has never used smokeless tobacco.  Recent Labs    06/27/23 1154  HGBA1C 5.5    Objective:    Physical Exam  Gen: Well-appearing, in no acute distress; non-toxic CV: Well-perfused. Warm.  Resp: Breathing unlabored on room air; no wheezing. Psych: Fluid speech in conversation; appropriate affect; normal thought process  Ortho Exam - Left hip: + TTP over the greater trochanteric bursa and the proximal IT  band.  There is a small degree of swelling here without warmth or redness.  Strength testing not tested today.  Imaging: No results found.  Past Medical/Family/Surgical/Social History: Medications & Allergies reviewed per EMR, new medications updated. Patient Active Problem List   Diagnosis Date Noted   Acute bacterial bronchitis 04/09/2023   Status post total  replacement of right hip 05/18/2022   Avascular necrosis of bone of right hip (HCC) 04/19/2022   Moderate persistent asthma 12/08/2018   Perennial allergic rhinitis with predominantly nonallergic component 12/08/2018   Allergic conjunctivitis 12/08/2018   History of epistaxis 12/08/2018   Atopic dermatitis 12/08/2018   Recurrent urticaria 12/08/2018   Skeeter syndrome 12/08/2018   Skin lesion of chest wall 07/22/2018   S/P laparoscopic cholecystectomy 03/28/2018   Dehydration 05/18/2016   Type 2 diabetes mellitus without complication, without long-term current use of insulin  (HCC) 11/01/2015   History of pacemaker    POTS (postural orthostatic tachycardia syndrome)    Iron deficiency anemia 11/11/2014   Chronic colitis 08/28/2013   Malignant melanoma, metastatic (HCC) 02/09/2013   Malignant melanoma (HCC) 12/01/2012   Malignant neoplasm metastatic to brain (HCC) 10/16/2012   Hypertension 09/30/2012   Anxiety state 09/30/2012   Hypoxemia 09/30/2012   Disorder of brain 09/08/2012   Dysautonomia (HCC) 02/25/2011   Anticoagulant long-term use 01/19/2011   Neuritis 01/19/2011   Skin change 01/19/2011   Cerebral infarction (HCC)    Palpitation 06/28/2010   Pacemaker-Biotronik 06/28/2010   Dizziness and giddiness 04/27/2010   DIVERTICULOSIS, COLON, HX OF 01/19/2009   CAROTID SINUS SYNDROME 11/17/2008   OTHER MALAISE AND FATIGUE 11/17/2008   HYPERTENSION, BENIGN 11/02/2008   ATRIAL TACHYCARDIA 09/16/2008   SYNCOPE, HX OF 08/09/2008   INJURY UNSPEC NERVE SHOULDER GIRDLE&UPPER LIMB 07/12/2008   PERSONAL HISTORY OF MALIGNANT MELANOMA OF SKIN 07/12/2008   LUQ PAIN 12/08/2007   NECK PAIN 09/26/2007   NUMBNESS, ARM 09/26/2007   CPK, ABNORMAL 09/26/2007   ARM PAIN, LEFT 06/25/2007   SWELLING OF LIMB 06/25/2007   OSTEOPENIA 06/25/2007   Past Medical History:  Diagnosis Date   Anemia    Angio-edema    Anxiety    Arthritis    Asthma    Atrial fibrillation (HCC)    Atrial  tachycardia (HCC)    Autonomic dysfunction    Complication of anesthesia    pt states wakes up with shakes   CVA (cerebral infarction)    2012 with dizziness and vision change felt embolic from atrial tachy   Disorder of bone and cartilage, unspecified    Diverticulosis    DM (diabetes mellitus) (HCC)    DVT (deep venous thrombosis) (HCC)    Eczema    Family history of anesthesia complication    PONV and  shaking    Fatty liver    GERD (gastroesophageal reflux disease)    History of hiatal hernia    History of radiation therapy 10/24/2012   brain   HLD (hyperlipidemia)    HTN (hypertension)    Hypoglycemia, unspecified    Liver metastasis    Lung metastasis    Metastasis to brain (HCC)    Osteopenia    Other nonspecific abnormal serum enzyme levels    Other specified congenital anomalies of nervous system    Pacemaker    autonomic dysfunction   Pancreatitis    from therapy   Peripheral vascular disease (HCC)    POTS (postural orthostatic tachycardia syndrome)    Raynaud's disease    due to chemo  Skin cancer    Hx: of lung lesion   Family History  Problem Relation Age of Onset   Allergic rhinitis Sister    Stroke Mother    Colon polyps Mother    Colon cancer Mother    Urticaria Mother    Allergic rhinitis Mother    Heart disease Father    Diabetes Father    Kidney disease Father    Diabetes Maternal Grandmother    Clotting disorder Maternal Grandmother        stroke   Crohn's disease Maternal Grandmother    Diabetes Paternal Grandmother    Aneurysm Sister        brain   Past Surgical History:  Procedure Laterality Date   ABLATION SAPHENOUS VEIN W/ RFA     2002 x3   CARPAL TUNNEL RELEASE Left 2000   CHOLECYSTECTOMY N/A 03/28/2018   Procedure: LAPAROSCOPIC CHOLECYSTECTOMY WITH INTRAOPERATIVE CHOLANGIOGRAM;  Surgeon: Gladis Cough, MD;  Location: [facility] OR;  Service: General;  Laterality: N/A;   CRANIOTOMY N/A 09/30/2012   suboccipital craniectomy    CRANIOTOMY Left 02/09/2013   Procedure: LEFT PARIETAL CRANIOTOMY with stealth;  Surgeon: Victory Gens, MD;  Location: [facility];  Service: Neurosurgery;  Laterality: Left;  LEFT Parietal Craniotomy for tumor with stealth   DEEP AXILLARY SENTINEL NODE BIOPSY / EXCISION     due to extensive Melanoma-right arm   fatty tumor removed  2000   from chest   INSERT / REPLACE / REMOVE PACEMAKER  2012   1999, x 3   LAPAROSCOPY  09/06/2011   Procedure: LAPAROSCOPY OPERATIVE;  Surgeon: Burnard A. Kandyce, MD;  Location: [facility];  Service: Gynecology;  Laterality: Left;  with Left Ovarian Cystectomy    MELANOMA EXCISION     with removal of lymph nodes, left shoulder   NOSE SURGERY  2010, 2012   for nose bleeds x 2   SINOSCOPY     TONSILLECTOMY AND ADENOIDECTOMY     TOTAL HIP ARTHROPLASTY Right 05/18/2022   Procedure: RIGHT TOTAL HIP ARTHROPLASTY ANTERIOR APPROACH;  Surgeon: Vernetta Lonni GRADE, MD;  Location: [facility];  Service: Orthopedics;  Laterality: Right;   Social History   Occupational History   Occupation: PRESIDENT    Employer: [employer]    Comment: self employed  Tobacco Use   Smoking status: Never   Smokeless tobacco: Never  Vaping Use   Vaping status: Never Used  Substance and Sexual Activity   Alcohol use: Never    Comment: glass of wine daily   Drug use: No   Sexual activity: Not on file

## 2023-09-03 NOTE — Telephone Encounter (Incomplete)
 Spoke with MRI scheduling and they are unable to get the multihance  contrast any lonmger.   The only thing they have is gadavist .  Patient is aware and will call Dr. Thayer office to discuss.  We will try and get this scheduled for 7/18 at 12.

## 2023-09-05 NOTE — Telephone Encounter (Signed)
 Patient stated she is returning [employer] call.

## 2023-09-06 ENCOUNTER — Ambulatory Visit: Admitting: Physical Therapy

## 2023-09-06 ENCOUNTER — Other Ambulatory Visit: Payer: Self-pay | Admitting: Radiation Therapy

## 2023-09-10 ENCOUNTER — Ambulatory Visit: Admitting: Sports Medicine

## 2023-09-11 ENCOUNTER — Encounter: Payer: Self-pay | Admitting: Cardiology

## 2023-09-11 ENCOUNTER — Ambulatory Visit: Attending: Cardiology | Admitting: Cardiology

## 2023-09-11 VITALS — BP 116/68 | HR 71 | Ht 63.0 in | Wt 138.2 lb

## 2023-09-11 DIAGNOSIS — I495 Sick sinus syndrome: Secondary | ICD-10-CM | POA: Diagnosis not present

## 2023-09-11 DIAGNOSIS — Z950 Presence of cardiac pacemaker: Secondary | ICD-10-CM | POA: Insufficient documentation

## 2023-09-11 DIAGNOSIS — G901 Familial dysautonomia [Riley-Day]: Secondary | ICD-10-CM | POA: Insufficient documentation

## 2023-09-11 MED ORDER — LABETALOL HCL 100 MG PO TABS: 50.0000 mg | ORAL_TABLET | Freq: Two times a day (BID) | ORAL | 5 refills | Status: DC

## 2023-09-11 NOTE — Patient Instructions (Addendum)
 Medication Instructions:  Your physician has recommended you make the following change in your medication:  Start labetalol  (50 mg) 1/2 tablet twice daily  Lab Work: None ordered.  You may go to any [facility] Location for your lab work:  [facility] - [address] ([facility]) - [address] [phone] [address]  [place] - [address]   [place]  - [address]   [place] - [address] - [address] [employer]  - [address] [place] - [address] ([facility]   If you have labs (blood work) drawn today and your tests are completely normal, you will receive your results only by: [employer] (if you have MyChart)  If you have any lab test that is abnormal or we need to change your treatment, we will call you or send a MyChart message to review the results.  Testing/Procedures: None ordered.  Follow-Up: At [facility], you and your health needs are our priority.  As part of our continuing mission to provide you with exceptional heart care, we have created designated Provider Care Teams.  These Care Teams include your primary Cardiologist (physician) and Advanced Practice Providers (APPs -  Physician Assistants and Nurse Practitioners) who all work together to provide you with the care you need, when you need it.  We recommend signing up for the patient portal called MyChart.  Sign up information is provided on this After Visit Summary.  MyChart is used to connect with patients for Virtual Visits (Telemedicine).  Patients are able to view lab/test results, encounter notes, upcoming appointments, etc.  Non-urgent messages can be sent to your provider as well.   To learn more about what you can do with MyChart, go to [email or web address].    Your next appointment:   1 year(s)  The format for your next appointment:   In Person  Provider:   Danelle Birmingham, MD{or one of the following Advanced Practice Providers on your designated Care Team:   Charlies Arthur, [place] Ozell Jodie Passey, [place] Leotis Barrack, NP  Note: Remote monitoring is used to monitor your Pacemaker/ ICD from home. This monitoring reduces the number of office visits required to check your device to one time per year. It allows us  to keep an eye on the functioning of your device to ensure it is working properly.

## 2023-09-11 NOTE — Progress Notes (Signed)
  Electrophysiology Office Follow up Visit Note:    Date:  09/11/2023   ID:  [name], DOB 1955/04/14, MRN [id number]  PCP:  Charlett Apolinar POUR, MD  [facility] Cardiologist:  None  [facility] Electrophysiologist:  Elspeth Sage, MD    Interval History:     [name] is a 68 y.o. female who presents for a follow up visit.   She was most recently seen by Dr. Sage June 19, 2023.  She has a history of dysautonomia.  She also has a history of Raynaud's phenomenon, metastatic melanoma, DVT, diabetes, stroke, atrial fibrillation.  At the last appointment with Dr. Sage she was being referred to Dr. Nada in [place] for her dysautonomia.  She was recently discovered to have a pituitary tumor.  A brain MRI is scheduled for later this week.  This diagnosis was made after she noticed visual field deficits. She has an upcoming MRI that will be supported by industry and Dr. Danelle Birmingham.  She is with her husband today in clinic.       Past medical, surgical, social and family history were reviewed.  ROS:   Please see the history of present illness.    All other systems reviewed and are negative.  EKGs/Labs/Other Studies Reviewed:    The following studies were reviewed today:  September 11, 2023 in clinic device interrogation personally reviewed Battery longevity 18 months Atrial pacing 78% Ventricular pacing 0 Lead parameter stable No programming changes made today    EKG Interpretation Date/Time:  Wednesday September 11 2023 15:01:51 EDT Ventricular Rate:  71 PR Interval:  176 QRS Duration:  64 QT Interval:  372 QTC Calculation: 404 R Axis:   47  Text Interpretation: Atrial-paced rhythm Confirmed by Cindie Smalls [phone]) on 09/11/2023 4:05:26 PM    Physical Exam:    VS:  BP 116/68   Pulse 71   Ht 5' 3 (1.6 m)   Wt 138 lb 3.2 oz (62.7 kg)   SpO2 99%   BMI 24.48 kg/m     Wt Readings from Last 3 Encounters:  09/11/23 138 lb 3.2 oz (62.7 kg)  07/09/23 142  lb 8 oz (64.6 kg)  06/27/23 146 lb 6.4 oz (66.4 kg)     GEN: no distress CARD: RRR, No MRG.  CIED pocket well-healed RESP: No IWOB. CTAB.      ASSESSMENT:    1. Dysautonomia (HCC)   2. Sinus node dysfunction (HCC)   3. Pacemaker-[employer]    PLAN:    In order of problems listed above:  #Sinus node dysfunction #Permanent pacemaker in situ Device functioning appropriately.  Continue remote monitoring.  #Dysautonomia Previously followed with Dr. Sage.   Metoprolol  tartrate 25 mg by mouth twice daily She does use labetalol  during the winter months in place of the metoprolol  given her history of Raynaud's during the cold weather  She will follow-up with EP in 6 months.   Signed, Smalls Cindie, MD, [facility], [facility] 09/11/2023 10:09 PM    Electrophysiology [facility]

## 2023-09-13 ENCOUNTER — Ambulatory Visit (HOSPITAL_COMMUNITY)
Admission: RE | Admit: 2023-09-13 | Discharge: 2023-09-13 | Disposition: A | Discharge status: Home / Self Care | Source: Ambulatory Visit | Attending: Neurological Surgery | Admitting: Neurological Surgery

## 2023-09-13 ENCOUNTER — Telehealth: Payer: Self-pay

## 2023-09-13 DIAGNOSIS — C7949 Secondary malignant neoplasm of other parts of nervous system: Secondary | ICD-10-CM | POA: Diagnosis present

## 2023-09-13 DIAGNOSIS — C7931 Secondary malignant neoplasm of brain: Secondary | ICD-10-CM | POA: Diagnosis present

## 2023-09-13 MED ORDER — GADOPICLENOL 0.5 MMOL/ML IV SOLN
3.0000 mmol | Freq: Once | INTRAVENOUS | Status: AC | PRN
  Administered 2023-09-13: 6 mL via INTRAVENOUS

## 2023-09-13 NOTE — Progress Notes (Signed)
 MRI completed. Pacemaker rep present for programing of device both before and after MRI. Dr. Waddell present at time of programing device both before and after MRI. Another cardiologist present in MRI for the MRI scan. This RN was monitoring patient during the procedure. No complications noted and Pacemaker rep stated it was successful at getting it programed to original setting at the end of the scan.

## 2023-09-13 NOTE — Telephone Encounter (Signed)
 Patient called just now and would like for a return call at [phone] ASAP. She is scheduled for a MRI of her brain at noon and will need to be there by 11:30 today, she has questions about her hip replacement that she got a year ago.

## 2023-09-13 NOTE — Telephone Encounter (Signed)
 Patient aware MRI's are fine with replacements

## 2023-09-16 ENCOUNTER — Inpatient Hospital Stay: Attending: Radiation Oncology

## 2023-09-17 ENCOUNTER — Ambulatory Visit: Admitting: Physical Therapy

## 2023-09-20 ENCOUNTER — Ambulatory Visit: Attending: Internal Medicine

## 2023-09-20 ENCOUNTER — Telehealth: Payer: Self-pay | Admitting: Cardiology

## 2023-09-20 DIAGNOSIS — Z950 Presence of cardiac pacemaker: Secondary | ICD-10-CM

## 2023-09-20 DIAGNOSIS — I495 Sick sinus syndrome: Secondary | ICD-10-CM

## 2023-09-20 LAB — CUP PACEART INCLINIC DEVICE CHECK
Date Time Interrogation Session: 20250725182017
Implantable Lead Connection Status: 753985
Implantable Lead Connection Status: 753985
Implantable Lead Implant Date: 19990303
Implantable Lead Implant Date: 19990303
Implantable Lead Location: 753859
Implantable Lead Location: 753860
Implantable Lead Model: 5092
Implantable Pulse Generator Implant Date: 20120905
Pulse Gen Serial Number: 66195584

## 2023-09-20 NOTE — Telephone Encounter (Signed)
 Device Clinic apt made today.

## 2023-09-20 NOTE — Patient Instructions (Signed)
 Direct number to Device Clinic - [phone] (Mon - Fri 8a-5pm) Main number to Cardiology office (also to access on call ) : [phone] [employer] Patient Care Line - [phone]  Device nurse and [employer] rep seeing you today will consult with your EP physicians on Monday and will follow up with next steps and plan regarding a generator change out.   If you experience any emergency symptoms over the weekend please call our on call doctor at above number to discuss emergency room plan.  The ER at [facility] will contact Dr. Nancey as appropriate as he is our EP on call this weekend.

## 2023-09-20 NOTE — Progress Notes (Signed)
 Patient seen acutely today in device clinic after becoming symptomatic with noted heart rates in the 40's on her watch following her MRI on 09/13/23.  Upon presenting device immediately showed warning at [facility] with programming at VDD rate of 60 bpm (minus 11%); however, due to VDD timing cycle patient could AS/VS as low as high 40's.   Pavan, [employer] rep contacted and took over interrogation in clinic:  Upon interrogation reprogrammed device to VVI 70 to ensure higher paced ERI base rates of approximately 62bpm. Downloaded a RAM dump and rep has emailed advanced product support.  RAM dump will be analyzed with advanced turnaround time of 1 business day. Based off analysis of RAM dump device may be reverted back to pre-ERI if battery analysis comes back favorably. If analysis shows true ERI then will need to proceed to urgent gen change planning to ensure patient is not symptomatic to ERI mode.    Patient was symptomatic to MRI mode when she came to clinic. Weekend on call EP physician, Dr. Nancey notified.  Will follow up with patient on Monday after reviewing with Dr. Cindie and Dr. Waddell as well as receiving results from Pavan regarding the data analysis review.   Patient was given after hours contact numbers as well as updated numbers for device clinic and [employer]'s patient care line support.

## 2023-09-20 NOTE — Telephone Encounter (Signed)
 Patient is here and stating that she got the pacemaker MRI done week ago. Stated she hasn't been feeling well all week and her heart rate went down to 47. Patient is in Zone A.

## 2023-09-21 ENCOUNTER — Telehealth: Payer: Self-pay | Admitting: Cardiovascular Disease

## 2023-09-21 ENCOUNTER — Telehealth: Payer: Self-pay | Admitting: Physician Assistant

## 2023-09-21 NOTE — Telephone Encounter (Addendum)
   The patient called the answering service after-hours today. See notes yesterday from device interrogation/clinic visit due to issues with device following recent MRI. She presented as a walk-in yesterday due to acute symptomatic pacemaker issues as she reports she was unable to get anyone on the phone. She had the complex device issues identified as outlined. Patient expressed disappointment in not seeing MD at that time. Detailed note was sent to EP team in case patient became symptomatic again and ended up in the ER. She was advised if continued symptoms over the weekend, to call the on-call service.  She called the on-call service this afternoon. Unfortunately, she has continued to feel similar symptoms as on presentation to device clinic with sensation of palpitations in chest/throat area with sense of dyspnea. She also woke up several times during the night like she had the night before feeling panicky/short of breath. HR has been 80s on watch but she did not track during sleeping hours. She is on metoprolol ; does not take labetalol  during the summer (only takes in winter due to Raynaud's). I discussed with on call EP Dr. Nancey who will contact device representative to discuss further.  Addendum: Dr. Nancey spoke with Pavan, [employer] rep, who confirmed device information has been escalated up the chain even [place] to the engineers to evaluate the RAM data and next steps. He reports that current device settings are programmed in such a way to not allow for dropping below 62bpm for sustained periods of time, though she may still feel some symptoms due to the way that the device is programmed/asynchronous. He reports that due to device age, unable to interrogate the device or make any changes over the phone. The original plan was to have patient contacted by EP team on Monday to discuss next steps for device reprogramming +/- proceed with gen change. I offered my deepest empathy over her recent  complex, debilitating medical issues and sympathized with how frustrating and challenging this experience has been. I explained to the patient that unfortunately we do not have the ability to make adjustments over the phone, and offered the plan to have the EP team reach out on Monday as planned. She does describe a variety of other symptoms in the context of multi-system disease including some discomfort in her upper back, question of recent pituitary issue. We discussed that if she is feeling poorly she is welcome to come to the emergency department to get checked out but she declined stating because she is a cancer patient she does not want to risk exposure to other illnesses in the ED. She would prefer direct admission. Dr. Nancey did not advise this. She is inquiring about speaking with an Production designer, theatre/television/film. I asked Dr. Nancey to give her a call to assist. He did call and speak wih patient. Appreciate EP team help.  Broly Hatfield N Makael Stein, PA-C

## 2023-09-21 NOTE — Telephone Encounter (Signed)
 I performed a telephone visit with the patient today.  The patient called the after-hours line complaining of palpitations. I had several conversations with Dayna Dunn and Pavan of [employer] throughout the day.  Essentially, the patient has an old [employer] device that was approaching ERI, but over a year of battery remaining. She underwent an MRI last Friday. Subsequently, the battery entered an ERI mode, resulting in a decreased lower rate limit that left the patient feeling tired and with low heart rates. She was seen in clinic yesterday at which time, per my conversation with Genevie, her device was reprogrammed to VVI 70 from a VDD mode. After, she developed palpitations and back pain. The fatigue seems to be improved.  When I spoke to the patient, I explained that the palpitations she is experiencing are due to the low battery condition and a safety mode the devices switches to. It explained that it is not dangerous.  Pavan expressed to me that she has good AV conduction, and it would be possible to reprogram her device AAI, and Pavan volunteered to meet the patient at the hospital to perform the programming changes.  The patient also expressed to me her concerns about the device checks before and after the MRI and mentioned that she thinks the device may not have been managed properly during the scan.  I explained that I was not present nor do I have details about this time period but am focusing on improving her current situation.   My phone call with the patient lasted 18 minutes.  Later Pavan explained that tech support informed him that AAI pacing was not available.  I called the patient back but she did not answer. I left a message explaining that we would have to work with the devices limitations at end of battery life -- which means either keeping her heart rate up -- which means more palpitations -- or lowering her heart rate somewhat, which may result in some fatigue and would not  likely completely rid her of palpitations. I asked Pavan to discuss programming options with the patient, and they may need to try a few settings to see what works best for her until the issue is completely resolved either by tech support -- her case has been escalated to engineers in [place] -- or by a generator change -- which will not be planned until next week.  I do not think admission or ER visit would have any benefit at this time.  I have spent in excess of one hour today on this issue, in conversation with the patient, industry representatives of [employer], our support staff, in addition to reviewing the patient's chart.

## 2023-09-23 ENCOUNTER — Encounter: Payer: Self-pay | Admitting: Cardiology

## 2023-09-23 NOTE — Telephone Encounter (Signed)
 Please see other communications between patient and our office today for up to date information.    Also, important to note, during the course of our 3 hour and 45 minute visit with patient in device clinic on Friday - I told her that we (Pavan from [employer] and I) would consult with Dr. Kennyth to confirm what occurred and changes made to ensure nothing further was needed, but did not tell her that Dr. Kennyth would see her.   That is not typical protocol for device clinic visits unless urgent need or Dr. determines he needs to see her based on the situation which he did not feel was necessary as her care needs were appropriately addressed by EP nurse and [employer] rep.    I told patient when she asked at 7pm if Dr. Kennyth was coming in that I was sorry but I did not realize that was her expectation (to actually see him versus ensuring an EP doctor was involved) and I did not communicate that clearly to Dr. Kennyth as a result.  Crystal Paramedic) and Sports coach) were present and both witnessed my stating this back to patient.  She was not happy he had left and Damien attempted to explain why he didn't come in to see her.  This was not an emergency situation and patient's needs were addressed with in office physician oversight and appropriate plans were in place should she need further follow up over the weekend.

## 2023-09-23 NOTE — Telephone Encounter (Signed)
 Patient following up requesting to speak with [name], RN. Patient declined discussing any further with me.

## 2023-09-23 NOTE — Telephone Encounter (Signed)
 Spoke with the patient who states that she was able to see the report from her pacemaker. She states that it did answer some of her questions but she still wants to discuss further with Dr. Waddell tomorrow. I will forward her questions to Dr. Waddell to make him aware of her concerns for her appointment tomorrow.

## 2023-09-23 NOTE — Telephone Encounter (Signed)
See other MyChart message from today.

## 2023-09-24 ENCOUNTER — Ambulatory Visit: Attending: Internal Medicine | Admitting: Internal Medicine

## 2023-09-24 ENCOUNTER — Encounter: Payer: Self-pay | Admitting: Internal Medicine

## 2023-09-24 VITALS — BP 128/60 | Ht 63.0 in | Wt 148.0 lb

## 2023-09-24 DIAGNOSIS — I495 Sick sinus syndrome: Secondary | ICD-10-CM | POA: Diagnosis present

## 2023-09-24 NOTE — Progress Notes (Signed)
 HPI [name] returns today for followup. I have not seen her as an outpatient in over 15 years. She is a long term patient of Dr. FELICIANA who has sinus node dysfunction s/p PPM insertion, atrial tachycardia with an unsuccessful ablation ( unsustained) who developed metastatic melanoma and has been treated with immunotherapy for over 10 years. She has yearly MRI' s and her device is not MRI compatible. Dr. FELICIANA has been with her with these. She had another in my presence over a week ago. After she developed mode switch to ventricular pacing mode due to a large current drain caused by the MRI pacing mode. She felt poorly with ventricular pacing. With the large current drain, the device has reached ERI.  In addition there is a question of a pituitary adenoma. However, her recent MRI is reassuring.  Allergies  Allergen Reactions   Bee Venom Swelling   Doxycycline  Hives   Erythromycin Hives   Gadavist  [Gadobutrol ] Hives, Shortness Of Breath and Itching   Paxlovid  [Nirmatrelvir -Ritonavir ] Swelling and Other (See Comments)    Mouth swelling, mouth ulcers   Penicillins Hives    Did it involve swelling of the face/tongue/throat, SOB, or low BP? No Did it involve sudden or severe rash/hives, skin peeling, or any reaction on the inside of your mouth or nose? Yes Did you need to seek medical attention at a hospital or doctor's office? Yes When did it last happen?      5+ years If all above answers are "NO", may proceed with cephalosporin use.    Sulfa Antibiotics Hives   Vancomycin  Hives, Rash and Other (See Comments)    Develops Red Man Syndrome and welts/hives if it is run too quickly via IV- must be run slowly   Preparation H [Pramox-Pe-Glycerin-Petrolatum] Hives    Cannot use  Cream or Suppositories    Phenergan  [Promethazine  Hcl] Other (See Comments)    Causes restless leg syndrome     Current Outpatient Medications  Medication Sig Dispense Refill   acetaminophen  (TYLENOL ) 500 MG  tablet Take 500 mg by mouth See admin instructions. Take 500 mg by mouth at bedtime and an additional 500 mg three times a day as needed for pain     albuterol  (PROVENTIL ) (2.5 MG/3ML) 0.083% nebulizer solution Take 3 mLs (2.5 mg total) by nebulization every 4 (four) hours as needed for wheezing or shortness of breath. 180 mL 1   albuterol  (VENTOLIN  HFA) 108 (90 Base) MCG/ACT inhaler Inhale 2 puffs into the lungs every 4 (four) hours as needed for wheezing or shortness of breath. 1 each 0   Ascorbic Acid  (VITAMIN C ) 1000 MG tablet Take 2,000 mg by mouth daily.     augmented betamethasone  dipropionate (DIPROLENE ) 0.05 % ointment Apply topically 2 (two) times daily. Limit to no longer than 2 weeks use. Not on face. 50 g 0   budesonide  (ENTOCORT EC ) 3 MG 24 hr capsule Take 3 mg by mouth as needed for rash.     calcium  carbonate (TUMS EX) 750 MG chewable tablet Chew 1 tablet by mouth daily.     clobetasol cream (TEMOVATE) 0.05 % Apply 1 application topically 2 (two) times daily as needed (rash).     clonazePAM  (KLONOPIN ) 1 MG tablet TAKE ONE TABLET BY MOUTH 2-3 TIMES A DAY AS NEEDED 90 tablet 2   cloNIDine  (CATAPRES ) 0.1 MG tablet TAKE 1 TABLET BY MOUTH EVERY NIGHT AT BEDTIME 90 tablet 2   cyanocobalamin  (VITAMIN B12) 1000 MCG/ML injection  INJECT 1ML INTRAMUSCULARLY EVERY 30 DAYS 3 mL 4   Desoximetasone  (TOPICORT ) 0.25 % ointment Apply 1 application topically 2 (two) times daily. 30 g 1   diphenhydrAMINE  (BENADRYL ) 25 MG tablet Take 25-50 mg by mouth daily as needed for allergies.     Dupilumab (DUPIXENT) 300 MG/2ML SOAJ Inject into the skin.     EPINEPHrine  0.3 mg/0.3 mL IJ SOAJ injection Inject 0.3 mg into the muscle as needed for anaphylaxis.     fexofenadine (ALLEGRA) 180 MG tablet Take 180 mg by mouth daily as needed for allergies or rhinitis.     Fluocinonide 0.1 % CREA Apply 1 application  topically 2 (two) times daily as needed. On affected areas     fluticasone  (FLONASE ) 50 MCG/ACT nasal spray  Place 2 sprays into both nostrils daily. 16 g 0   furosemide  (LASIX ) 20 MG tablet TAKE 1 TABLET BY MOUTH DAILY AS NEEDED 90 tablet 3   HYDROcodone -acetaminophen  (NORCO/VICODIN) 5-325 MG tablet Take 1-2 tablets by mouth every 6 (six) hours as needed for moderate pain. 30 tablet 0   hydrocortisone  (ANUSOL -HC) 25 MG suppository Place 25 mg rectally as needed for hemorrhoids.     ibuprofen  (ADVIL ) 800 MG tablet Take 1 tablet (800 mg total) by mouth every 8 (eight) hours as needed. 60 tablet 3   labetalol  (NORMODYNE ) 100 MG tablet Take 0.5 tablets (50 mg total) by mouth 2 (two) times daily. As needed 60 tablet 5   levETIRAcetam  (KEPPRA ) 250 MG tablet Take 250 mg by mouth See admin instructions. Take 250 mg by mouth at 8 AM, 2 PM, and 8 PM     levocetirizine (XYZAL ) 5 MG tablet TAKE 1 TABLET BY MOUTH EVERY DAY AS NEEDED 30 tablet 0   lidocaine  (XYLOCAINE ) 2 % solution 5mL swish and swallow as needed for sore throat; take with nystatin  susp 200 mL 0   methocarbamol  (ROBAXIN ) 500 MG tablet Take 1 tablet (500 mg total) by mouth every 6 (six) hours as needed for muscle spasms. 40 tablet 1   metoprolol  tartrate (LOPRESSOR ) 25 MG tablet Take 1 tablet (25 mg total) by mouth 2 (two) times daily. 180 tablet 3   nystatin  (MYCOSTATIN ) 100000 UNIT/ML suspension Use as directed 3 mLs (300,000 Units total) in the mouth or throat 3 (three) times daily as needed (thrush). 60 mL 4   Olopatadine  HCl (PATADAY ) 0.2 % SOLN Use one drop in each eye once daily as needed. 2.5 mL 5   ondansetron  (ZOFRAN -ODT) 4 MG disintegrating tablet Take 1-2 tablets (4-8 mg total) by mouth every 8 (eight) hours as needed. 20 tablet 0   polyethylene glycol (MIRALAX  / GLYCOLAX ) packet Take 17 g by mouth at bedtime.     potassium chloride  SA (KLOR-CON  M) 20 MEQ tablet Take 1 tablet (20 mEq total) by mouth 2 (two) times daily. On days when taking furosemide  60 tablet 3   Probiotic Product (PROBIOTIC PO) Take 2 capsules by mouth daily.      prochlorperazine  (COMPAZINE ) 10 MG tablet Take 1 tablet (10 mg total) by mouth every 6 (six) hours as needed for nausea or vomiting. 60 tablet 0   sodium chloride  (OCEAN) 0.65 % SOLN nasal spray Place 1 spray into both nostrils as needed.     Spacer/Aero-Holding Chambers [facility] DIAMOND) MISC See admin instructions. PRN     Syringe/Needle, Disp, (SYRINGE 3CC/27GX1-1/4) 27G X 1-1/4 3 ML MISC Inject 1 mL into the muscle every 30 (thirty) days. Vit B12 12 each 0   tiZANidine  (  ZANAFLEX ) 2 MG tablet Take 1 tablet (2 mg total) by mouth every 6 (six) hours as needed for muscle spasms. 30 tablet 0   Vitamin D , Ergocalciferol , (DRISDOL ) 50000 units CAPS capsule TAKE 1 CAPSULE (50,000 UNITS TOTAL) BY MOUTH EVERY 7 (SEVEN) DAYS. 4 capsule 11   Zinc  50 MG TABS Take 50 mg by mouth daily.     Current Facility-Administered Medications  Medication Dose Route Frequency Provider Last Rate Last Admin   mupirocin  ointment (BACTROBAN ) 2 %   Topical Daily Vernetta Lonni GRADE, MD         Past Medical History:  Diagnosis Date   Anemia    Angio-edema    Anxiety    Arthritis    Asthma    Atrial fibrillation (HCC)    Atrial tachycardia (HCC)    Autonomic dysfunction    Complication of anesthesia    pt states wakes up with shakes   CVA (cerebral infarction)    2012 with dizziness and vision change felt embolic from atrial tachy   Disorder of bone and cartilage, unspecified    Diverticulosis    DM (diabetes mellitus) (HCC)    DVT (deep venous thrombosis) (HCC)    Eczema    Family history of anesthesia complication    PONV and  shaking    Fatty liver    GERD (gastroesophageal reflux disease)    History of hiatal hernia    History of radiation therapy 10/24/2012   brain   HLD (hyperlipidemia)    HTN (hypertension)    Hypoglycemia, unspecified    Liver metastasis    Lung metastasis    Metastasis to brain (HCC)    Osteopenia    Other nonspecific abnormal serum enzyme levels    Other  specified congenital anomalies of nervous system    Pacemaker    autonomic dysfunction   Pancreatitis    from therapy   Peripheral vascular disease (HCC)    POTS (postural orthostatic tachycardia syndrome)    Raynaud's disease    due to chemo   Skin cancer    Hx: of lung lesion    ROS:   All systems reviewed and negative except as noted in the HPI.   Past Surgical History:  Procedure Laterality Date   ABLATION SAPHENOUS VEIN W/ RFA     2002 x3   CARPAL TUNNEL RELEASE Left 2000   CHOLECYSTECTOMY N/A 03/28/2018   Procedure: LAPAROSCOPIC CHOLECYSTECTOMY WITH INTRAOPERATIVE CHOLANGIOGRAM;  Surgeon: Gladis Cough, MD;  Location: [facility] OR;  Service: General;  Laterality: N/A;   CRANIOTOMY N/A 09/30/2012   suboccipital craniectomy   CRANIOTOMY Left 02/09/2013   Procedure: LEFT PARIETAL CRANIOTOMY with stealth;  Surgeon: Victory Gens, MD;  Location: [facility];  Service: Neurosurgery;  Laterality: Left;  LEFT Parietal Craniotomy for tumor with stealth   DEEP AXILLARY SENTINEL NODE BIOPSY / EXCISION     due to extensive Melanoma-right arm   fatty tumor removed  2000   from chest   INSERT / REPLACE / REMOVE PACEMAKER  2012   1999, x 3   LAPAROSCOPY  09/06/2011   Procedure: LAPAROSCOPY OPERATIVE;  Surgeon: Burnard A. Kandyce, MD;  Location: [facility] ORS;  Service: Gynecology;  Laterality: Left;  with Left Ovarian Cystectomy    MELANOMA EXCISION     with removal of lymph nodes, left shoulder   NOSE SURGERY  2010, 2012   for nose bleeds x 2   SINOSCOPY     TONSILLECTOMY AND ADENOIDECTOMY  TOTAL HIP ARTHROPLASTY Right 05/18/2022   Procedure: RIGHT TOTAL HIP ARTHROPLASTY ANTERIOR APPROACH;  Surgeon: Vernetta Lonni GRADE, MD;  Location: [facility] ORS;  Service: Orthopedics;  Laterality: Right;     Family History  Problem Relation Age of Onset   Allergic rhinitis Sister    Stroke Mother    Colon polyps Mother    Colon cancer Mother    Urticaria Mother    Allergic rhinitis Mother     Heart disease Father    Diabetes Father    Kidney disease Father    Diabetes Maternal Grandmother    Clotting disorder Maternal Grandmother        stroke   Crohn's disease Maternal Grandmother    Diabetes Paternal Grandmother    Aneurysm Sister        brain     Social History   Socioeconomic History   Marital status: Married    Spouse name: Not on file   Number of children: 0   Years of education: Not on file   Highest education level: Not on file  Occupational History   Occupation: PRESIDENT    Employer: [employer]    Comment: self employed  Tobacco Use   Smoking status: Never   Smokeless tobacco: Never  Vaping Use   Vaping status: Never Used  Substance and Sexual Activity   Alcohol use: Never    Comment: glass of wine daily   Drug use: No   Sexual activity: Not on file  Other Topics Concern   Not on file  Social History Narrative   4 years college hh of 2    Neg tad   Social Drivers of Corporate investment banker Strain: Low Risk  (11/22/2021)   Overall Financial Resource Strain (CARDIA)    Difficulty of Paying Living Expenses: Not very hard  Food Insecurity: No Food Insecurity (06/08/2022)   Hunger Vital Sign    Worried About Running Out of Food in the Last Year: Never true    Ran Out of Food in the Last Year: Never true  Transportation Needs: No Transportation Needs (05/18/2022)   PRAPARE - Administrator, Civil Service (Medical): No    Lack of Transportation (Non-Medical): No  Physical Activity: Sufficiently Active (03/14/2021)   Exercise Vital Sign    Days of Exercise per Week: 4 days    Minutes of Exercise per Session: 90 min  Stress: No Stress Concern Present (03/14/2021)   [employer] of Occupational Health - Occupational Stress Questionnaire    Feeling of Stress : Not at all  Recent Concern: Stress - Stress Concern Present (12/26/2020)   [employer] of Occupational Health - Occupational Stress Questionnaire     Feeling of Stress : To some extent  Social Connections: Socially Integrated (03/14/2021)   Social Connection and Isolation Panel    Frequency of Communication with Friends and Family: More than three times a week    Frequency of Social Gatherings with Friends and Family: More than three times a week    Attends Religious Services: More than 4 times per year    Active Member of [employer] or Organizations: Yes    Attends Banker Meetings: More than 4 times per year    Marital Status: Married  Catering manager Violence: Not At Risk (05/18/2022)   Humiliation, Afraid, Rape, and Kick questionnaire    Fear of Current or Ex-Partner: No    Emotionally Abused: No    Physically Abused: No  Sexually Abused: No     BP 128/60   Ht 5' 3 (1.6 m)   Wt 148 lb (67.1 kg)   SpO2 100%   BMI 26.22 kg/m   Physical Exam:  Well appearing NAD HEENT: Unremarkable Neck:  No JVD, no thyromegally Lymphatics:  No adenopathy Back:  No CVA tenderness Lungs:  Clear HEART:  Regular rate rhythm, no murmurs, no rubs, no clicks Abd:  soft, positive bowel sounds, no organomegally, no rebound, no guarding Ext:  2 plus pulses, no edema, no cyanosis, no clubbing Skin:  No rashes no nodules Neuro:  CN II through XII intact, motor grossly intact  DEVICE  Normal device function.  See PaceArt for details. 1 year to ERI. Now back in the DDD mode.  Assess/Plan: Sinus node dysfunction - she is back to DDD with back up atrial pacing. We will follow. PPM - her device has been reprogrammed and is now DDD with atrial pacing. She will undergo watchful waiting. If her battery voltage depletes prematurely we will change her out. Hopefully the batter will continue for another year. Autonomic dysfunction - she is s/p PPM insertion and her syjmptoms appear controlled. Metastatic melanoma - no evidence of progression on the MRI with immune therapy.   Danelle Diella Gillingham,MD

## 2023-09-24 NOTE — Patient Instructions (Addendum)
 Medication Instructions:  Your physician recommends that you continue on your current medications as directed. Please refer to the Current Medication list given to you today.  *If you need a refill on your cardiac medications before your next appointment, please call your pharmacy*  Lab Work: None ordered.  You may go to any [facility] Location for your lab work:  [facility] - [id number] ([facility]) - [address] [phone] [address]  [place] - [address]   [place]  - [address]   [place] - [address] - [address] [employer]  - [address] ([facility]   If you have labs (blood work) drawn today and your tests are completely normal, you will receive your results only by: [employer] (if you have MyChart)  If you have any lab test that is abnormal or we need to change your treatment, we will call you or send a MyChart message to review the results.  Testing/Procedures: None ordered.  Follow-Up: At [facility], you and your health needs are our priority.  As part of our continuing mission to provide you with exceptional heart care, we have created designated Provider Care Teams.  These Care Teams include your primary Cardiologist (physician) and Advanced Practice Providers (APPs -  Physician Assistants and Nurse Practitioners) who all work together to provide you with the care you need, when you need it.  Your next appointment:   1 month  The format for your next appointment:   In Person  Provider:   Danelle Birmingham, MD

## 2023-09-25 ENCOUNTER — Ambulatory Visit: Attending: Cardiology

## 2023-09-25 ENCOUNTER — Telehealth: Payer: Self-pay

## 2023-09-25 DIAGNOSIS — I495 Sick sinus syndrome: Secondary | ICD-10-CM

## 2023-09-25 NOTE — Progress Notes (Signed)
 Patient contacted office today reporting symptoms.  She believes that her device reverted back to ERI (back up mode) after Jamie with [employer] was able to reset her device back to DDD LRL 60 settings in office yesterday with Dr. Waddell.    Jamie ([employer] rep) present today and confirmed that patient was not in ERI and still with current DDD settings with battery reading 1 year 2 months.  Device operating normally and should not be causing symptoms.  Patient did not agree as she was certain she was still experiencing pacemaker syndrome symptoms that she felt over the weekend.  Symptoms include: fluttering in her throat and significant pain to her back between her shoulder blades.  I spent a total of 1 hour and 30 minutes with patient today providing education and attempting reassurance that symptoms were not device related.    Dr. Waddell consulted over the phone and put on speaker phone to discuss with patient.  He re-iterated that patient's symptoms were not related to the device and there was nothing further we could do as she does not need a gen change at present.  She should continue with original plan for him to see her back in clinic on 10/31/23 for close monitoring and watchful waiting.  If current symptoms are concerning she should be evaluated further in the ER as something else could be going on, in particular a PE and getting that ruled out would be priority.    Patient asks if Dr. Waddell would order CT scan to rule out PE from the office and he explained that he could not and she needed this work up in the ER.  After the conversation ended with Dr. Waddell, patient continued to explain why she could not go to the hospital.  She asked if we could at least do the d-dimer blood work here and I explained to her that was not appropriate and should be performed as a 2 part emergent work up with CT to confirm or safely rule out the presence of PE.  Patient was dissatisfied with the outcome and said she  would rather die than go to the ER.  I did call [facility] to verify the waiting time as she was considering going there if they were not busy.  [facility] was at capacity and patient chose to go home and see if things improve on their own.   She verbalized understanding of Dr. Adrian recommendations and that he stated clearly that her options were to have symptoms further evaluated or to go home and see if things improved on their own, but the symptoms were not related to her device and there was nothing further we could do for her.  If symptoms were severe she should go to ER.

## 2023-09-25 NOTE — Telephone Encounter (Signed)
 The pt agreed to come in Today at 3 pm.

## 2023-09-25 NOTE — Telephone Encounter (Signed)
 The pt states that she came in yesterday and saw Dr. Waddell and Warren from [facility]. She said Warren was able to program her device back to normal with 1 year left on the battery.  Last night her device converted back to ERI because she feels the same as she did over the weekend.

## 2023-09-27 ENCOUNTER — Encounter (HOSPITAL_BASED_OUTPATIENT_CLINIC_OR_DEPARTMENT_OTHER): Payer: Self-pay

## 2023-09-27 ENCOUNTER — Emergency Department (HOSPITAL_BASED_OUTPATIENT_CLINIC_OR_DEPARTMENT_OTHER)
Admission: EM | Admit: 2023-09-27 | Discharge: 2023-09-27 | Disposition: A | Discharge status: Home / Self Care | Attending: Emergency Medicine | Admitting: Emergency Medicine

## 2023-09-27 ENCOUNTER — Other Ambulatory Visit: Payer: Self-pay

## 2023-09-27 ENCOUNTER — Emergency Department (HOSPITAL_BASED_OUTPATIENT_CLINIC_OR_DEPARTMENT_OTHER)

## 2023-09-27 DIAGNOSIS — Z79899 Other long term (current) drug therapy: Secondary | ICD-10-CM | POA: Insufficient documentation

## 2023-09-27 DIAGNOSIS — Z950 Presence of cardiac pacemaker: Secondary | ICD-10-CM | POA: Diagnosis not present

## 2023-09-27 DIAGNOSIS — M549 Dorsalgia, unspecified: Secondary | ICD-10-CM

## 2023-09-27 DIAGNOSIS — R0789 Other chest pain: Secondary | ICD-10-CM | POA: Diagnosis present

## 2023-09-27 DIAGNOSIS — M546 Pain in thoracic spine: Secondary | ICD-10-CM | POA: Diagnosis not present

## 2023-09-27 LAB — CBC WITH DIFFERENTIAL/PLATELET
Abs Immature Granulocytes: 0.01 K/uL (ref 0.00–0.07)
Basophils Absolute: 0 K/uL (ref 0.0–0.1)
Basophils Relative: 1 %
Eosinophils Absolute: 0.1 K/uL (ref 0.0–0.5)
Eosinophils Relative: 1 %
HCT: 39.1 % (ref 36.0–46.0)
Hemoglobin: 13 g/dL (ref 12.0–15.0)
Immature Granulocytes: 0 %
Lymphocytes Relative: 18 %
Lymphs Abs: 1.2 K/uL (ref 0.7–4.0)
MCH: 29 pg (ref 26.0–34.0)
MCHC: 33.2 g/dL (ref 30.0–36.0)
MCV: 87.1 fL (ref 80.0–100.0)
Monocytes Absolute: 0.6 K/uL (ref 0.1–1.0)
Monocytes Relative: 8 %
Neutro Abs: 5.2 K/uL (ref 1.7–7.7)
Neutrophils Relative %: 72 %
Platelets: 232 K/uL (ref 150–400)
RBC: 4.49 MIL/uL (ref 3.87–5.11)
RDW: 12.6 % (ref 11.5–15.5)
WBC: 7.1 K/uL (ref 4.0–10.5)
nRBC: 0 % (ref 0.0–0.2)

## 2023-09-27 LAB — BASIC METABOLIC PANEL WITH GFR
Anion gap: 13 (ref 5–15)
BUN: 15 mg/dL (ref 8–23)
CO2: 23 mmol/L (ref 22–32)
Calcium: 9.4 mg/dL (ref 8.9–10.3)
Chloride: 103 mmol/L (ref 98–111)
Creatinine, Ser: 0.88 mg/dL (ref 0.44–1.00)
GFR, Estimated: >60 mL/min (ref >60)
Glucose, Bld: 118 mg/dL — ABNORMAL HIGH (ref 70–99)
Potassium: 3.9 mmol/L (ref 3.5–5.1)
Sodium: 139 mmol/L (ref 135–145)

## 2023-09-27 LAB — TROPONIN T, HIGH SENSITIVITY: Troponin T High Sensitivity: <15 ng/L (ref <19)

## 2023-09-27 MED ORDER — IOHEXOL 350 MG/ML SOLN
75.0000 mL | Freq: Once | INTRAVENOUS | Status: AC | PRN
  Administered 2023-09-27: 75 mL via INTRAVENOUS

## 2023-09-27 NOTE — ED Notes (Signed)
 Patient transported to CT

## 2023-09-27 NOTE — ED Notes (Signed)
 Reviewed discharge instructions and follow up care with pt. Pt verbalized understanding and had no further questions. Pt exited ED without complications.

## 2023-09-27 NOTE — ED Provider Notes (Signed)
 [place] EMERGENCY DEPARTMENT AT [facility] Provider Note   CSN: [id number] Arrival date & time: 09/27/23  1211     Patient presents with: Chest Pain and Pacemaker Problem (Biotronic)   [name] is a 68 y.o. female w/ hx of SSS and biotronik pacemaker, presenting to ED with palpitations x 1 week.  She says she feels fluttering in her throat and a persistent pain in her mid-thoracic back, nonpositional, improves with motrin  but then returns.  She saw her EP doctor's clinic several times the past few weeks and states that she was told she needs a pacemaker replacement, but later that it had been reprogrammed to emergency settings and would last another year.  Her cardiology clinic had suggested an ED workup for alternative causes of chest and back discomfort, feeling this was unlikely related to her pacemaker.   HPI     Prior to Admission medications   Medication Sig Start Date End Date Taking? Authorizing Provider  acetaminophen  (TYLENOL ) 500 MG tablet Take 500 mg by mouth See admin instructions. Take 500 mg by mouth at bedtime and an additional 500 mg three times a day as needed for pain    [provider]  albuterol  (PROVENTIL ) (2.5 MG/3ML) 0.083% nebulizer solution Take 3 mLs (2.5 mg total) by nebulization every 4 (four) hours as needed for wheezing or shortness of breath. 04/08/23   [name], MD  albuterol  (VENTOLIN  HFA) 108 [phone] Base) MCG/ACT inhaler Inhale 2 puffs into the lungs every 4 (four) hours as needed for wheezing or shortness of breath. 04/08/23   [name], MD  Ascorbic Acid  (VITAMIN C ) 1000 MG tablet Take 2,000 mg by mouth daily.    [provider]  augmented betamethasone  dipropionate (DIPROLENE ) 0.05 % ointment Apply topically 2 (two) times daily. Limit to no longer than 2 weeks use. Not on face. 02/24/19   Panosh, Apolinar POUR, MD  budesonide  (ENTOCORT EC ) 3 MG 24 hr capsule Take 3 mg by mouth as needed for rash.  01/02/21   [provider]  calcium  carbonate (TUMS EX) 750 MG chewable tablet Chew 1 tablet by mouth daily.    [provider]  clobetasol cream (TEMOVATE) 0.05 % Apply 1 application topically 2 (two) times daily as needed (rash).    [provider]  clonazePAM  (KLONOPIN ) 1 MG tablet TAKE ONE TABLET BY MOUTH 2-3 TIMES A DAY AS NEEDED 06/27/23   Panosh, Apolinar POUR, MD  cloNIDine  (CATAPRES ) 0.1 MG tablet TAKE 1 TABLET BY MOUTH EVERY NIGHT AT BEDTIME 04/30/23   Fernande Elspeth BROCKS, MD  cyanocobalamin  (VITAMIN B12) 1000 MCG/ML injection INJECT 1ML INTRAMUSCULARLY EVERY 30 DAYS 11/03/22   Webb, Padonda B, FNP  Desoximetasone  (TOPICORT ) 0.25 % ointment Apply 1 application topically 2 (two) times daily. 09/07/19   Panosh, Wanda K, MD  diphenhydrAMINE  (BENADRYL ) 25 MG tablet Take 25-50 mg by mouth daily as needed for allergies.    [provider]  Dupilumab (DUPIXENT) 300 MG/2ML SOAJ Inject into the skin. 03/04/23   [provider]  EPINEPHrine  0.3 mg/0.3 mL IJ SOAJ injection Inject 0.3 mg into the muscle as needed for anaphylaxis. 02/08/20   [provider]  fexofenadine (ALLEGRA) 180 MG tablet Take 180 mg by mouth daily as needed for allergies or rhinitis.    [provider]  Fluocinonide 0.1 % CREA Apply 1 application  topically 2 (two) times daily as needed. On affected areas 01/23/23   [provider]  fluticasone  (FLONASE ) 50  MCG/ACT nasal spray Place 2 sprays into both nostrils daily. 03/12/21   Leath-Warren, Etta PARAS, NP  furosemide  (LASIX ) 20 MG tablet TAKE 1 TABLET BY MOUTH DAILY AS NEEDED 12/14/22   Fernande Elspeth BROCKS, MD  HYDROcodone -acetaminophen  (NORCO/VICODIN) 5-325 MG tablet Take 1-2 tablets by mouth every 6 (six) hours as needed for moderate pain. 05/19/22   Vernetta Lonni GRADE, MD  hydrocortisone  (ANUSOL -HC) 25 MG suppository Place 25 mg rectally as needed for hemorrhoids. 07/19/15   [provider]  ibuprofen  (ADVIL ) 800 MG  tablet Take 1 tablet (800 mg total) by mouth every 8 (eight) hours as needed. 08/16/22   Vernetta Lonni GRADE, MD  labetalol  (NORMODYNE ) 100 MG tablet Take 0.5 tablets (50 mg total) by mouth 2 (two) times daily. As needed 09/11/23   Cindie Ole DASEN, MD  levETIRAcetam  (KEPPRA ) 250 MG tablet Take 250 mg by mouth See admin instructions. Take 250 mg by mouth at 8 AM, 2 PM, and 8 PM    [provider]  levocetirizine (XYZAL ) 5 MG tablet TAKE 1 TABLET BY MOUTH EVERY DAY AS NEEDED 06/08/19   Bobbitt, Elgin Pepper, MD  lidocaine  (XYLOCAINE ) 2 % solution 5mL swish and swallow as needed for sore throat; take with nystatin  susp 03/21/21   Vivienne Delon HERO, PA-C  methocarbamol  (ROBAXIN ) 500 MG tablet Take 1 tablet (500 mg total) by mouth every 6 (six) hours as needed for muscle spasms. 05/19/22   Gretta Bertrum ORN, PA-C  metoprolol  tartrate (LOPRESSOR ) 25 MG tablet Take 1 tablet (25 mg total) by mouth 2 (two) times daily. 08/14/23 11/12/23  Cindie Ole DASEN, MD  nystatin  (MYCOSTATIN ) 100000 UNIT/ML suspension Use as directed 3 mLs (300,000 Units total) in the mouth or throat 3 (three) times daily as needed (thrush). 06/27/23   Panosh, Wanda K, MD  Olopatadine  HCl (PATADAY ) 0.2 % SOLN Use one drop in each eye once daily as needed. 12/08/18   Bobbitt, Elgin Pepper, MD  ondansetron  (ZOFRAN -ODT) 4 MG disintegrating tablet Take 1-2 tablets (4-8 mg total) by mouth every 8 (eight) hours as needed. 06/14/23   Vivienne Delon HERO, PA-C  polyethylene glycol (MIRALAX  / GLYCOLAX ) packet Take 17 g by mouth at bedtime.    [provider]  potassium chloride  SA (KLOR-CON  M) 20 MEQ tablet Take 1 tablet (20 mEq total) by mouth 2 (two) times daily. On days when taking furosemide  06/27/23   Panosh, Wanda K, MD  Probiotic Product (PROBIOTIC PO) Take 2 capsules by mouth daily.    [provider]  prochlorperazine  (COMPAZINE ) 10 MG tablet Take 1 tablet (10 mg total) by mouth every 6 (six) hours as needed for  nausea or vomiting. 08/03/20   Johnny Garnette LABOR, MD  sodium chloride  (OCEAN) 0.65 % SOLN nasal spray Place 1 spray into both nostrils as needed.    [provider]  Spacer/Aero-Holding Chambers [facility] DIAMOND) MISC See admin instructions. PRN 02/08/20   [provider]  Syringe/Needle, Disp, (SYRINGE 3CC/27GX1-1/4) 27G X 1-1/4 3 ML MISC Inject 1 mL into the muscle every 30 (thirty) days. Vit B12 10/19/20   Panosh, Apolinar POUR, MD  tiZANidine  (ZANAFLEX ) 2 MG tablet Take 1 tablet (2 mg total) by mouth every 6 (six) hours as needed for muscle spasms. 05/19/22   Vernetta Lonni GRADE, MD  Vitamin D , Ergocalciferol , (DRISDOL ) 50000 units CAPS capsule TAKE 1 CAPSULE (50,000 UNITS TOTAL) BY MOUTH EVERY 7 (SEVEN) DAYS. 03/29/17   Fernande Elspeth BROCKS, MD  Zinc  50 MG TABS Take 50 mg by  mouth daily.    [provider]    Allergies: Bee venom, Doxycycline , Erythromycin, Gadavist  [gadobutrol ], Paxlovid  [nirmatrelvir -ritonavir ], Penicillins, Sulfa antibiotics, Vancomycin , Preparation h [pramox-pe-glycerin-petrolatum], and Phenergan  [promethazine  hcl]    Review of Systems  Updated Vital Signs BP 127/76   Pulse 61   Temp 97.6 F (36.4 C)   Resp 17   SpO2 99%   Physical Exam  (all labs ordered are listed, but only abnormal results are displayed) Labs Reviewed  BASIC METABOLIC PANEL WITH GFR - Abnormal; Notable for the following components:      Result Value   Glucose, Bld 118 (*)    All other components within normal limits  CBC WITH DIFFERENTIAL/PLATELET  TROPONIN T, HIGH SENSITIVITY    EKG: EKG Interpretation Date/Time:  Friday September 27 2023 13:05:57 EDT Ventricular Rate:  64 PR Interval:  146 QRS Duration:  84 QT Interval:  381 QTC Calculation: 393 R Axis:   5  Text Interpretation: Sinus rhythm Probable left atrial enlargement Confirmed by Cottie Cough [phone]) on 09/27/2023 2:01:24 PM  Radiology: CT T-SPINE NO CHARGE Result Date: 09/27/2023 EXAM: CT THORACIC  SPINE WITHOUT CONTRAST 09/27/2023 01:51:34 PM TECHNIQUE: CT of the thoracic spine was performed without the administration of intravenous contrast. Multiplanar reformatted images are provided for review. Automated exposure control, iterative reconstruction, and/or weight based adjustment of the mA/kV was utilized to reduce the radiation dose to as low as reasonably achievable. COMPARISON: None available. CLINICAL HISTORY: Back pain, history of problems with pacemaker. FINDINGS: BONES AND ALIGNMENT: Normal vertebral body heights. No acute fracture or suspicious bone lesion. Normal alignment. Anterior endplate spurring at several contiguous levels in the mid thoracic spine. DEGENERATIVE CHANGES: Anterior endplate spurring at several contiguous levels in the mid thoracic spine. SOFT TISSUES: No acute abnormality. Mild dependent atelectasis in the lung bases. Moderate hiatal hernia. IMPRESSION: 1. No acute abnormality of the thoracic spine related to the back pain. 2. Moderate hiatal hernia. Electronically signed by: Katheleen Faes MD 09/27/2023 02:12 PM EDT RP Workstation: [id number]   CT Angio Chest PE W and/or Wo Contrast Result Date: 09/27/2023 EXAM: CTA of the Chest with contrast for PE 09/27/2023 01:51:34 PM TECHNIQUE: CTA of the chest was performed after the administration of intravenous contrast. Multiplanar reformatted images are provided for review. MIP images are provided for review. Automated exposure control, iterative reconstruction, and/or weight based adjustment of the mA/kV was utilized to reduce the radiation dose to as low as reasonably achievable. COMPARISON: None available. CLINICAL HISTORY: Pulmonary embolism (PE) suspected, high prob. Back pain, history of problems with pacemaker. FINDINGS: PULMONARY ARTERIES: Pulmonary arteries are adequately opacified for evaluation. No pulmonary embolism. Main pulmonary artery is normal in caliber. MEDIASTINUM: The heart and pericardium demonstrate no acute  abnormality. Minimal calcified aortic plaque in the descending thoracic aorta. Left subclavian transvenous pacing leads. LYMPH NODES: No mediastinal, hilar or axillary lymphadenopathy. LUNGS AND PLEURA: Minimal dependent atelectasis posteriorly in both lower lobes. No focal consolidation or pulmonary edema. No pleural effusion or pneumothorax. UPPER ABDOMEN: Cholecystectomy clips. Moderate hiatal hernia involving the gastric fundus. SOFT TISSUES AND BONES: Spurring in the mid thoracic spine. No acute bone or soft tissue abnormality. IMPRESSION: 1. No pulmonary embolism. 2. Minimal dependent atelectasis posteriorly in both lower lobes. 3. Atheromatous aorta Electronically signed by: Katheleen Faes MD 09/27/2023 02:11 PM EDT RP Workstation: [id number]     Procedures   Medications Ordered in the ED  iohexol  (OMNIPAQUE ) 350 MG/ML injection 75 mL (75 mLs Intravenous Contrast Given 09/27/23 1339)  Clinical Course as of 09/28/23 1642  Fri Sep 27, 2023  1427 Spoke to Dr Jeffrie from cardiology who confirms atrial pacing seen on prior tracings, and likely chronic and not the issue for her symptoms.  Telemetry reassuring. No A flutter or A Fib.SABRA okay for discharge home now. [MT]    Clinical Course User Index [MT] Krew Hortman, Donnice PARAS, MD                                 Medical Decision Making Amount and/or Complexity of Data Reviewed Labs: ordered. Radiology: ordered. ECG/medicine tests: ordered.  Risk Prescription drug management.   This patient presents to the ED with concern for chest discomfort, back discomfort. This involves an extensive number of treatment options, and is a complaint that carries with it a high risk of complications and morbidity.  The differential diagnosis includes MSK pain vs reflux vs atypical ACS vs arrhythmia vs PNA vs other  Co-morbidities that complicate the patient evaluation: cardiac pacemaker  Additional history obtained from family at bedside  External records  from outside source obtained and reviewed including cardiology EP records this week - reprogrammed pacemaker to DDD settings  I ordered and personally interpreted labs.  The pertinent results include:  no emergent findings  I ordered imaging studies including CT PE, CT thoracic spine I independently visualized and interpreted imaging which showed no emergent findings to explain her symptoms I agree with the radiologist interpretation  The patient was maintained on a cardiac monitor.  I personally viewed and interpreted the cardiac monitored which showed an underlying rhythm of: atrial paced rhythm, regular  Per my interpretation the patient's ECG shows no acute ischemic findings  I have reviewed the patients home medicines and have made adjustments as needed  Test Considered: doubt aortic dissection; spinal infection  I spoke to cardiology on the phone - see ed course - okay for outpatient follow up  My suspicion for life threatening emergency is quite low. 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  I advised she continue motrin  for now and reach out again to the EP office if she has any further concerns about the pacemaker, although it appears to be functioning appropriately at this time.  Disposition:  After consideration of the diagnostic results and the patient's response to treatment, I feel that the patient would benefit from outpatient follow up      Final diagnoses:  Chest discomfort  Discomfort of back    ED Discharge Orders     None          Cottie Donnice PARAS, MD 09/28/23 1642

## 2023-09-27 NOTE — ED Triage Notes (Signed)
 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 Saw cardiologist Wednesday, they said I needed to be scanned for PE or something else. Brain MRI 2023/10/13, pacemaker went dead, following October 30, 2023 was advised she'd need surgery for new pacemaker. Then they got the settings back, but it's come & gone.

## 2023-10-01 ENCOUNTER — Telehealth: Payer: Self-pay

## 2023-10-01 DIAGNOSIS — I495 Sick sinus syndrome: Secondary | ICD-10-CM

## 2023-10-01 NOTE — Telephone Encounter (Signed)
 Pt called in stating she would like to speak with [facility] and would not tell me why. Pt would like a call back at her convenience

## 2023-10-02 NOTE — Telephone Encounter (Signed)
 LM - returning patient's call

## 2023-10-02 NOTE — Telephone Encounter (Signed)
 Discussed with Dr. Waddell.  Her symptoms are not related to her pacemaker.  She has follow up with him early September and if able to get sooner appointment with [facility], we will forward them any needed documentation.

## 2023-10-02 NOTE — Telephone Encounter (Addendum)
 Patient called to inform us  of the following:    She went to the ER on 8/1 as we suggested for her chest pain symptoms.  Work up was negative and nothing new was discovered, although believes she contracted COVID from there but has treated it on her own and she is better now.  ER report suggests her pain is MSK as it is relieved by Motrin .   Continues to have significant symptoms and is convinced something is wrong with her pacemaker.  It continues to wake her up in the middle of the night with throat and middle chest tightness and squeezing that cuts her air off. She would like a response regarding this.  Without effectiveness, I attempted to reassure her that her device has been working normally.  States, explain to me then why I was fine before the MRI , had a great experience and then later that night my symptoms started and have not resolved. Again my attempts  to provide explanation and reassurance were not effective, she has had this explained to her multiple times as to what occurred.  She asked me if her leads were damaged in the MRI would that cause these symptoms.  I replied that if her leads were damaged we would have seen that in the numerous interrogations performed since and all her lead data has been normal and stable.    She has sent her records to the [facility] EP clinic and is waiting to hear back if they will accept her as a new patient.   Informed patient I would forward this to Dr. Waddell to make him aware and someone would follow up with her.

## 2023-10-03 NOTE — Addendum Note (Signed)
 Addended by: GERSHON PALMA C on: 10/03/2023 01:35 PM   Modules accepted: Orders

## 2023-10-03 NOTE — Telephone Encounter (Signed)
 Dr. Waddell is aware and in agreement with referral for patient to [facility].   Urgent referral placed per patient's request to Dr. Lorean.

## 2023-10-04 ENCOUNTER — Encounter (HOSPITAL_BASED_OUTPATIENT_CLINIC_OR_DEPARTMENT_OTHER): Payer: Self-pay | Admitting: Family

## 2023-10-07 ENCOUNTER — Ambulatory Visit: Attending: Internal Medicine

## 2023-10-07 ENCOUNTER — Other Ambulatory Visit: Payer: Self-pay

## 2023-10-07 DIAGNOSIS — G901 Familial dysautonomia [Riley-Day]: Secondary | ICD-10-CM

## 2023-10-07 DIAGNOSIS — I4719 Other supraventricular tachycardia: Secondary | ICD-10-CM

## 2023-10-07 DIAGNOSIS — I495 Sick sinus syndrome: Secondary | ICD-10-CM

## 2023-10-07 NOTE — Progress Notes (Unsigned)
 Enrolled for [employer] to mail a ZIO XT long term holter monitor to the patients address on file.

## 2023-10-14 ENCOUNTER — Other Ambulatory Visit

## 2023-10-14 DIAGNOSIS — Z79899 Other long term (current) drug therapy: Secondary | ICD-10-CM

## 2023-10-14 NOTE — Progress Notes (Signed)
 10/14/2023 Name: [name] MRN: [id number] DOB: 11-13-1955  Chief Complaint  Patient presents with   Medication Management    [name] is a 68 y.o. year old female who presented for a telephone visit.   They were referred to the pharmacist by their PCP for assistance in managing complex medication management.    Subjective:  Care Team: Primary Care Provider: Charlett Apolinar POUR, MD   Medication Access/Adherence  Current Pharmacy:  [id number] - [place], [place] - [address] [name] [address] Phone: [phone] Fax: [phone]  [id number] - [place], [name] - [name] [name] [address] Phone: [phone] Fax: [phone]  [place] [facility] [address] [name] [address] Phone: [phone] Fax: [phone]  [facility] [place] [facility] [address] [name] [address] Phone: [phone] Fax: [phone]   Patient reports affordability concerns with their medications: No  Patient reports access/transportation concerns to their pharmacy: No  Patient reports adherence concerns with their medications:  No     Medication Management:  Current adherence strategy: Keeps her medication in a chest in alphabetical order. Does not want to use a daily pill organizer due to so many prn meds  Patient reports Good adherence to medications  No medication changes since last follow up with this PharmD   -Patient has concerns with pace maker, has an appointment at [name] with electrophysiologist to get a second opinion.   Objective:  Lab Results  Component Value Date   HGBA1C 5.5 06/27/2023    Lab Results  Component Value Date   CREATININE 0.88 09/27/2023   BUN 15 09/27/2023   NA 139 09/27/2023   K 3.9 09/27/2023   CL 103 09/27/2023   CO2 23 09/27/2023    Lab Results   Component Value Date   CHOL 220 (H) 07/07/2021   HDL 55 07/07/2021   LDLCALC 133 (H) 07/07/2021   LDLDIRECT 129 (H) 07/07/2021   TRIG 179 (H) 07/07/2021   CHOLHDL 4.0 07/07/2021    Medications Reviewed Today     Reviewed by [name], RPH (Pharmacist) on 10/14/23 at 1553  Med List Status: <None>   Medication Order Taking? Sig Documenting Provider Last Dose Status Informant  acetaminophen  (TYLENOL ) 500 MG tablet [id number]  Take 500 mg by mouth See admin instructions. Take 500 mg by mouth at bedtime and an additional 500 mg three times a day as needed for pain [provider]  Active Self           Med Note (MENDOZA MENDEZ, CARLOS A   Tue May 31, 2020  2:15 PM)    albuterol  (PROVENTIL ) (2.5 MG/3ML) 0.083% nebulizer solution [id number]  Take 3 mLs (2.5 mg total) by nebulization every 4 (four) hours as needed for wheezing or shortness of breath. [name], MD  Active   albuterol  (VENTOLIN  HFA) 108 [phone] Base) MCG/ACT inhaler [id number]  Inhale 2 puffs into the lungs every 4 (four) hours as needed for wheezing or shortness of breath. [name], MD  Active   Ascorbic Acid  (VITAMIN C ) 1000 MG tablet [id number]  Take 2,000 mg by mouth daily. [provider]  Active Self  augmented betamethasone  dipropionate (DIPROLENE ) 0.05 % ointment [id number]  Apply topically 2 (two) times daily. Limit to no longer than 2 weeks use. Not on face. Panosh,  Wanda K, MD  Active Self  budesonide  (ENTOCORT EC ) 3 MG 24 hr capsule [id number]  Take 3 mg by mouth as needed for rash. [provider]  Active Self  calcium  carbonate (TUMS EX) 750 MG chewable tablet [id number]  Chew 1 tablet by mouth daily. [provider]  Active Self  clobetasol cream (TEMOVATE) 0.05 % [id number]  Apply 1 application topically 2 (two) times daily as needed (rash). [provider]  Active Self  clonazePAM  (KLONOPIN ) 1 MG tablet [id number]  TAKE ONE TABLET BY MOUTH 2-3 TIMES A  DAY AS NEEDED Panosh, Wanda K, MD  Active   cloNIDine  (CATAPRES ) 0.1 MG tablet [id number]  TAKE 1 TABLET BY MOUTH EVERY NIGHT AT BEDTIME Fernande Elspeth BROCKS, MD  Active   cyanocobalamin  (VITAMIN B12) 1000 MCG/ML injection [id number]  INJECT 1ML INTRAMUSCULARLY EVERY 30 DAYS Webb, Padonda B, FNP  Active   Desoximetasone  (TOPICORT ) 0.25 % ointment [id number]  Apply 1 application topically 2 (two) times daily. Panosh, Apolinar POUR, MD  Active Self  diphenhydrAMINE  (BENADRYL ) 25 MG tablet [id number]  Take 25-50 mg by mouth daily as needed for allergies. [provider]  Active Self  Dupilumab (DUPIXENT) 300 MG/2ML EMMANUEL [id number]  Inject into the skin. [provider]  Active            Med Note [name], ANGELA H   Tue Mar 05, 2023  1:20 PM) Every other week. Not started yet, pending insurance approval  EPINEPHrine  0.3 mg/0.3 mL IJ SOAJ injection [id number]  Inject 0.3 mg into the muscle as needed for anaphylaxis. [provider]  Active Self  fexofenadine (ALLEGRA) 180 MG tablet [id number]  Take 180 mg by mouth daily as needed for allergies or rhinitis. [provider]  Active Self  Fluocinonide 0.1 % CREA [id number]  Apply 1 application  topically 2 (two) times daily as needed. On affected areas [provider]  Active   fluticasone  (FLONASE ) 50 MCG/ACT nasal spray [id number]  Place 2 sprays into both nostrils daily. Leath-WarrenEtta PARAS, NP  Active Self  furosemide  (LASIX ) 20 MG tablet [id number]  TAKE 1 TABLET BY MOUTH DAILY AS NEEDED Fernande Elspeth BROCKS, MD  Active   HYDROcodone -acetaminophen  (NORCO/VICODIN) 5-325 MG tablet [id number]  Take 1-2 tablets by mouth every 6 (six) hours as needed for moderate pain. Vernetta Lonni GRADE, MD  Active   hydrocortisone  (ANUSOL -HC) 25 MG suppository [id number]  Place 25 mg rectally as needed for hemorrhoids. [provider]  Active Self  ibuprofen  (ADVIL ) 800 MG tablet [id number]  Take 1 tablet (800 mg total) by mouth every 8  (eight) hours as needed. Vernetta Lonni GRADE, MD  Active   labetalol  (NORMODYNE ) 100 MG tablet [id number]  Take 0.5 tablets (50 mg total) by mouth 2 (two) times daily. As needed Cindie Ole DASEN, MD  Active   levETIRAcetam  (KEPPRA ) 250 MG tablet [id number]  Take 250 mg by mouth See admin instructions. Take 250 mg by mouth at 8 AM, 2 PM, and 8 PM [provider]  Active Self           Med Note ELSWORTH, JACQUELINE L   Tue Jun 27, 2021  3:16 PM)    levocetirizine (XYZAL ) 5 MG tablet [id number]  TAKE 1 TABLET BY MOUTH EVERY DAY AS NEEDED Bobbitt, Elgin Pepper, MD  Active Self  lidocaine  (XYLOCAINE ) 2 % solution [id number]  5mL swish and swallow as needed for sore throat; take with nystatin  susp Burnette, Jennifer M, PA-C  Active Self  methocarbamol  (ROBAXIN ) 500 MG tablet [id number]  Take 1 tablet (500 mg total) by mouth every 6 (six) hours as needed for muscle spasms. Gretta Bertrum ORN, PA-C  Active   metoprolol  tartrate (LOPRESSOR ) 25 MG tablet [id number]  Take 1 tablet (25 mg total) by mouth 2 (two) times daily. Cindie Ole DASEN, MD  Active   mupirocin  ointment (BACTROBAN ) 2 % [id number]   Vernetta Lonni GRADE, MD  Active   nystatin  (MYCOSTATIN ) 100000 UNIT/ML suspension [id number]  Use as directed 3 mLs (300,000 Units total) in the mouth or throat 3 (three) times daily as needed (thrush). Panosh, Wanda K, MD  Active   Olopatadine  HCl (PATADAY ) 0.2 % SOLN [id number]  Use one drop in each eye once daily as needed. Bobbitt, Elgin Pepper, MD  Active Self  ondansetron  (ZOFRAN -ODT) 4 MG disintegrating tablet [id number]  Take 1-2 tablets (4-8 mg total) by mouth every 8 (eight) hours as needed. Burnette, Jennifer M, PA-C  Active   polyethylene glycol (MIRALAX  / GLYCOLAX ) packet [id number]  Take 17 g by mouth at bedtime. [provider]  Active Self  potassium chloride  SA (KLOR-CON  M) 20 MEQ tablet [id number]  Take 1 tablet (20 mEq total) by mouth 2 (two) times daily. On days when taking  furosemide  Panosh, Wanda K, MD  Active   Probiotic Product (PROBIOTIC PO) [id number]  Take 2 capsules by mouth daily. [provider]  Active Self  prochlorperazine  (COMPAZINE ) 10 MG tablet [id number]  Take 1 tablet (10 mg total) by mouth every 6 (six) hours as needed for nausea or vomiting. Johnny Garnette LABOR, MD  Active Self  sodium chloride  (OCEAN) 0.65 % SOLN nasal spray [id number]  Place 1 spray into both nostrils as needed. [provider]  Active Self  Spacer/Aero-Holding [facility] DIAMOND) MISC [id number]  See admin instructions. PRN [provider]  Active Self  Syringe/Needle, Disp, (SYRINGE 3CC/27GX1-1/4) 27G X 1-1/4 3 ML MISC [id number]  Inject 1 mL into the muscle every 30 (thirty) days. Vit B12 Panosh, Wanda K, MD  Active Self  tiZANidine  (ZANAFLEX ) 2 MG tablet [id number]  Take 1 tablet (2 mg total) by mouth every 6 (six) hours as needed for muscle spasms. Vernetta Lonni GRADE, MD  Active   Vitamin D , Ergocalciferol , (DRISDOL ) 50000 units CAPS capsule [id number]  TAKE 1 CAPSULE (50,000 UNITS TOTAL) BY MOUTH EVERY 7 (SEVEN) DAYS. Fernande Elspeth BROCKS, MD  Active Self  Zinc  50 MG TABS [id number]  Take 50 mg by mouth daily. [provider]  Active Self              Assessment/Plan:   Medication Management: - Currently strategy sufficient to maintain appropriate adherence to prescribed medication regimen -Reviewed myChart list of medication and indication -Scheduling PCP follow-up with Dr. Charlett September 16th   Follow Up Plan: November 10th   Jon VEAR Lindau, PharmD Clinical Pharmacist [phone]

## 2023-10-31 ENCOUNTER — Encounter: Payer: Self-pay | Admitting: Internal Medicine

## 2023-10-31 ENCOUNTER — Ambulatory Visit: Attending: Internal Medicine | Admitting: Internal Medicine

## 2023-10-31 VITALS — BP 142/78 | HR 70 | Ht 63.0 in | Wt 141.0 lb

## 2023-10-31 DIAGNOSIS — I495 Sick sinus syndrome: Secondary | ICD-10-CM | POA: Insufficient documentation

## 2023-10-31 NOTE — Patient Instructions (Addendum)

## 2023-10-31 NOTE — Progress Notes (Signed)
 HPI [name] returns today for followup. She is a pleasant 68 yo woman with a h/o Atrial tachy and autonomic dysfunction with tachy-brady syndrome s/p PPM insertion. The patient has melanoma and mets to the head which she has been treated for several years. She has undergone yearly MRI's and she has a non-MRI compatible pacing system. She is still about a year to ERI. She paces in the atria but not the ventricle. She recently wore a cardiac monitor which demonstrated very brief AT, which is not new.  Allergies  Allergen Reactions   Bee Venom Swelling   Doxycycline  Hives   Erythromycin Hives   Gadavist  [Gadobutrol ] Hives, Shortness Of Breath and Itching   Paxlovid  [Nirmatrelvir -Ritonavir ] Swelling and Other (See Comments)    Mouth swelling, mouth ulcers   Penicillins Hives    Did it involve swelling of the face/tongue/throat, SOB, or low BP? No Did it involve sudden or severe rash/hives, skin peeling, or any reaction on the inside of your mouth or nose? Yes Did you need to seek medical attention at a hospital or doctor's office? Yes When did it last happen?      5+ years If all above answers are "NO", may proceed with cephalosporin use.    Sulfa Antibiotics Hives   Vancomycin  Hives, Rash and Other (See Comments)    Develops Red Man Syndrome and welts/hives if it is run too quickly via IV- must be run slowly   Preparation H [Pramox-Pe-Glycerin-Petrolatum] Hives    Cannot use  Cream or Suppositories    Phenergan  [Promethazine  Hcl] Other (See Comments)    Causes restless leg syndrome     Current Outpatient Medications  Medication Sig Dispense Refill   acetaminophen  (TYLENOL ) 500 MG tablet Take 500 mg by mouth See admin instructions. Take 500 mg by mouth at bedtime and an additional 500 mg three times a day as needed for pain     albuterol  (PROVENTIL ) (2.5 MG/3ML) 0.083% nebulizer solution Take 3 mLs (2.5 mg total) by nebulization every 4 (four) hours as needed for wheezing  or shortness of breath. 180 mL 1   albuterol  (VENTOLIN  HFA) 108 (90 Base) MCG/ACT inhaler Inhale 2 puffs into the lungs every 4 (four) hours as needed for wheezing or shortness of breath. 1 each 0   Ascorbic Acid  (VITAMIN C ) 1000 MG tablet Take 2,000 mg by mouth daily.     augmented betamethasone  dipropionate (DIPROLENE ) 0.05 % ointment Apply topically 2 (two) times daily. Limit to no longer than 2 weeks use. Not on face. 50 g 0   budesonide  (ENTOCORT EC ) 3 MG 24 hr capsule Take 3 mg by mouth as needed for rash.     calcium  carbonate (TUMS EX) 750 MG chewable tablet Chew 1 tablet by mouth daily.     clobetasol cream (TEMOVATE) 0.05 % Apply 1 application topically 2 (two) times daily as needed (rash).     clonazePAM  (KLONOPIN ) 1 MG tablet TAKE ONE TABLET BY MOUTH 2-3 TIMES A DAY AS NEEDED 90 tablet 2   cloNIDine  (CATAPRES ) 0.1 MG tablet TAKE 1 TABLET BY MOUTH EVERY NIGHT AT BEDTIME 90 tablet 2   cyanocobalamin  (VITAMIN B12) 1000 MCG/ML injection INJECT 1ML INTRAMUSCULARLY EVERY 30 DAYS 3 mL 4   Desoximetasone  (TOPICORT ) 0.25 % ointment Apply 1 application topically 2 (two) times daily. 30 g 1   diphenhydrAMINE  (BENADRYL ) 25 MG tablet Take 25-50 mg by mouth daily as needed for allergies.     Dupilumab (DUPIXENT) 300 MG/2ML  SOAJ Inject into the skin.     EPINEPHrine  0.3 mg/0.3 mL IJ SOAJ injection Inject 0.3 mg into the muscle as needed for anaphylaxis.     fexofenadine (ALLEGRA) 180 MG tablet Take 180 mg by mouth daily as needed for allergies or rhinitis.     Fluocinonide 0.1 % CREA Apply 1 application  topically 2 (two) times daily as needed. On affected areas     fluticasone  (FLONASE ) 50 MCG/ACT nasal spray Place 2 sprays into both nostrils daily. 16 g 0   furosemide  (LASIX ) 20 MG tablet TAKE 1 TABLET BY MOUTH DAILY AS NEEDED 90 tablet 3   HYDROcodone -acetaminophen  (NORCO/VICODIN) 5-325 MG tablet Take 1-2 tablets by mouth every 6 (six) hours as needed for moderate pain. 30 tablet 0    hydrocortisone  (ANUSOL -HC) 25 MG suppository Place 25 mg rectally as needed for hemorrhoids.     ibuprofen  (ADVIL ) 800 MG tablet Take 1 tablet (800 mg total) by mouth every 8 (eight) hours as needed. 60 tablet 3   labetalol  (NORMODYNE ) 100 MG tablet Take 0.5 tablets (50 mg total) by mouth 2 (two) times daily. As needed 60 tablet 5   levETIRAcetam  (KEPPRA ) 250 MG tablet Take 250 mg by mouth See admin instructions. Take 250 mg by mouth at 8 AM, 2 PM, and 8 PM     levocetirizine (XYZAL ) 5 MG tablet TAKE 1 TABLET BY MOUTH EVERY DAY AS NEEDED 30 tablet 0   lidocaine  (XYLOCAINE ) 2 % solution 5mL swish and swallow as needed for sore throat; take with nystatin  susp 200 mL 0   methocarbamol  (ROBAXIN ) 500 MG tablet Take 1 tablet (500 mg total) by mouth every 6 (six) hours as needed for muscle spasms. 40 tablet 1   metoprolol  tartrate (LOPRESSOR ) 25 MG tablet Take 1 tablet (25 mg total) by mouth 2 (two) times daily. 180 tablet 3   nystatin  (MYCOSTATIN ) 100000 UNIT/ML suspension Use as directed 3 mLs (300,000 Units total) in the mouth or throat 3 (three) times daily as needed (thrush). 60 mL 4   Olopatadine  HCl (PATADAY ) 0.2 % SOLN Use one drop in each eye once daily as needed. 2.5 mL 5   ondansetron  (ZOFRAN -ODT) 4 MG disintegrating tablet Take 1-2 tablets (4-8 mg total) by mouth every 8 (eight) hours as needed. 20 tablet 0   polyethylene glycol (MIRALAX  / GLYCOLAX ) packet Take 17 g by mouth at bedtime.     potassium chloride  SA (KLOR-CON  M) 20 MEQ tablet Take 1 tablet (20 mEq total) by mouth 2 (two) times daily. On days when taking furosemide  60 tablet 3   Probiotic Product (PROBIOTIC PO) Take 2 capsules by mouth daily.     prochlorperazine  (COMPAZINE ) 10 MG tablet Take 1 tablet (10 mg total) by mouth every 6 (six) hours as needed for nausea or vomiting. 60 tablet 0   sodium chloride  (OCEAN) 0.65 % SOLN nasal spray Place 1 spray into both nostrils as needed.     Spacer/Aero-Holding [facility]  DIAMOND) MISC See admin instructions. PRN     Syringe/Needle, Disp, (SYRINGE 3CC/27GX1-1/4) 27G X 1-1/4 3 ML MISC Inject 1 mL into the muscle every 30 (thirty) days. Vit B12 12 each 0   tiZANidine  (ZANAFLEX ) 2 MG tablet Take 1 tablet (2 mg total) by mouth every 6 (six) hours as needed for muscle spasms. 30 tablet 0   Vitamin D , Ergocalciferol , (DRISDOL ) 50000 units CAPS capsule TAKE 1 CAPSULE (50,000 UNITS TOTAL) BY MOUTH EVERY 7 (SEVEN) DAYS. 4 capsule 11   Zinc   50 MG TABS Take 50 mg by mouth daily.     Current Facility-Administered Medications  Medication Dose Route Frequency Provider Last Rate Last Admin   mupirocin  ointment (BACTROBAN ) 2 %   Topical Daily Vernetta Lonni GRADE, MD         Past Medical History:  Diagnosis Date   Anemia    Angio-edema    Anxiety    Arthritis    Asthma    Atrial fibrillation (HCC)    Atrial tachycardia (HCC)    Autonomic dysfunction    Complication of anesthesia    pt states wakes up with shakes   CVA (cerebral infarction)    2012 with dizziness and vision change felt embolic from atrial tachy   Disorder of bone and cartilage, unspecified    Diverticulosis    DM (diabetes mellitus) (HCC)    DVT (deep venous thrombosis) (HCC)    Eczema    Family history of anesthesia complication    PONV and  shaking    Fatty liver    GERD (gastroesophageal reflux disease)    History of hiatal hernia    History of radiation therapy 10/24/2012   brain   HLD (hyperlipidemia)    HTN (hypertension)    Hypoglycemia, unspecified    Liver metastasis    Lung metastasis    Metastasis to brain (HCC)    Osteopenia    Other nonspecific abnormal serum enzyme levels    Other specified congenital anomalies of nervous system    Pacemaker    autonomic dysfunction   Pancreatitis    from therapy   Peripheral vascular disease (HCC)    POTS (postural orthostatic tachycardia syndrome)    Raynaud's disease    due to chemo   Skin cancer    Hx: of lung lesion     ROS:   All systems reviewed and negative except as noted in the HPI.   Past Surgical History:  Procedure Laterality Date   ABLATION SAPHENOUS VEIN W/ RFA     2002 x3   CARPAL TUNNEL RELEASE Left 2000   CHOLECYSTECTOMY N/A 03/28/2018   Procedure: LAPAROSCOPIC CHOLECYSTECTOMY WITH INTRAOPERATIVE CHOLANGIOGRAM;  Surgeon: Gladis Cough, MD;  Location: [facility] OR;  Service: General;  Laterality: N/A;   CRANIOTOMY N/A 09/30/2012   suboccipital craniectomy   CRANIOTOMY Left 02/09/2013   Procedure: LEFT PARIETAL CRANIOTOMY with stealth;  Surgeon: Victory Gens, MD;  Location: [facility];  Service: Neurosurgery;  Laterality: Left;  LEFT Parietal Craniotomy for tumor with stealth   DEEP AXILLARY SENTINEL NODE BIOPSY / EXCISION     due to extensive Melanoma-right arm   fatty tumor removed  2000   from chest   INSERT / REPLACE / REMOVE PACEMAKER  2012   1999, x 3   LAPAROSCOPY  09/06/2011   Procedure: LAPAROSCOPY OPERATIVE;  Surgeon: Burnard A. Kandyce, MD;  Location: [facility] ORS;  Service: Gynecology;  Laterality: Left;  with Left Ovarian Cystectomy    MELANOMA EXCISION     with removal of lymph nodes, left shoulder   NOSE SURGERY  2010, 2012   for nose bleeds x 2   SINOSCOPY     TONSILLECTOMY AND ADENOIDECTOMY     TOTAL HIP ARTHROPLASTY Right 05/18/2022   Procedure: RIGHT TOTAL HIP ARTHROPLASTY ANTERIOR APPROACH;  Surgeon: Vernetta Lonni GRADE, MD;  Location: [facility] ORS;  Service: Orthopedics;  Laterality: Right;     Family History  Problem Relation Age of Onset   Allergic rhinitis Sister    Stroke Mother  Colon polyps Mother    Colon cancer Mother    Urticaria Mother    Allergic rhinitis Mother    Heart disease Father    Diabetes Father    Kidney disease Father    Diabetes Maternal Grandmother    Clotting disorder Maternal Grandmother        stroke   Crohn's disease Maternal Grandmother    Diabetes Paternal Grandmother    Aneurysm Sister        brain     Social History    Socioeconomic History   Marital status: Married    Spouse name: Not on file   Number of children: 0   Years of education: Not on file   Highest education level: Not on file  Occupational History   Occupation: PRESIDENT    Employer: [employer]    Comment: self employed  Tobacco Use   Smoking status: Never   Smokeless tobacco: Never  Vaping Use   Vaping status: Never Used  Substance and Sexual Activity   Alcohol use: Never    Comment: glass of wine daily   Drug use: No   Sexual activity: Not on file  Other Topics Concern   Not on file  Social History Narrative   4 years college hh of 2    Neg tad   Social Drivers of Corporate investment banker Strain: Low Risk  (11/22/2021)   Overall Financial Resource Strain (CARDIA)    Difficulty of Paying Living Expenses: Not very hard  Food Insecurity: No Food Insecurity (06/08/2022)   Hunger Vital Sign    Worried About Running Out of Food in the Last Year: Never true    Ran Out of Food in the Last Year: Never true  Transportation Needs: No Transportation Needs (05/18/2022)   PRAPARE - Administrator, Civil Service (Medical): No    Lack of Transportation (Non-Medical): No  Physical Activity: Sufficiently Active (03/14/2021)   Exercise Vital Sign    Days of Exercise per Week: 4 days    Minutes of Exercise per Session: 90 min  Stress: No Stress Concern Present (03/14/2021)   [employer] of Occupational Health - Occupational Stress Questionnaire    Feeling of Stress : Not at all  Recent Concern: Stress - Stress Concern Present (12/26/2020)   [employer] of Occupational Health - Occupational Stress Questionnaire    Feeling of Stress : To some extent  Social Connections: Socially Integrated (03/14/2021)   Social Connection and Isolation Panel    Frequency of Communication with Friends and Family: More than three times a week    Frequency of Social Gatherings with Friends and Family: More than three times  a week    Attends Religious Services: More than 4 times per year    Active Member of [employer] or Organizations: Yes    Attends Engineer, structural: More than 4 times per year    Marital Status: Married  Catering manager Violence: Not At Risk (05/18/2022)   Humiliation, Afraid, Rape, and Kick questionnaire    Fear of Current or Ex-Partner: No    Emotionally Abused: No    Physically Abused: No    Sexually Abused: No     BP (!) 142/78   Pulse 70   Ht 5' 3 (1.6 m)   Wt 141 lb (64 kg)   SpO2 95%   BMI 24.98 kg/m   Physical Exam:  Well appearing 68 yo woman, NAD HEENT: Unremarkable Neck:  No  JVD, no thyromegally Lymphatics:  No adenopathy Back:  No CVA tenderness Lungs:  Clear with no wheezes HEART:  Regular rate rhythm, no murmurs, no rubs, no clicks Abd:  soft, positive bowel sounds, no organomegally, no rebound, no guarding Ext:  2 plus pulses, no edema, no cyanosis, no clubbing Skin:  No rashes no nodules Neuro:  CN II through XII intact, motor grossly intact  DEVICE  Normal device function.  See PaceArt for details.   Assess/Plan: SND - she is pacing 77% in the atria. She is asymptomatic s/p PPM insertion.  PPM - her device is about a year to ERI. She has had some pains over her left chest but CT scan was negative a few weeks ago.  Atrial tachy - she wore a Zio and had very brief runs of NS SVT. No symptoms.  Metastatic melanoma - she appears to be in remission  Danelle Waddell COME

## 2023-11-03 ENCOUNTER — Other Ambulatory Visit: Payer: Self-pay | Admitting: Internal Medicine

## 2023-11-03 DIAGNOSIS — I495 Sick sinus syndrome: Secondary | ICD-10-CM | POA: Diagnosis not present

## 2023-11-03 DIAGNOSIS — I4719 Other supraventricular tachycardia: Secondary | ICD-10-CM | POA: Diagnosis not present

## 2023-11-03 DIAGNOSIS — G901 Familial dysautonomia [Riley-Day]: Secondary | ICD-10-CM | POA: Diagnosis not present

## 2023-11-10 ENCOUNTER — Telehealth: Payer: Self-pay | Admitting: Cardiology

## 2023-11-10 NOTE — Telephone Encounter (Signed)
   Patient called today with concern of abnormal pacemaker function due to symptoms of fatigue/lack of energy with palpitations in chest/throat, back pain. Patient reports symptoms are essentially identical to what she experienced in July of this year when her device entered ERI status following MRI. Patient feels that her device is only pacing from the ventricle.   Appears that following this MRI, her device reverted to VDD programing. Device data ultimately sent to [employer] advanced product support and updated data showed approximately 1 year to ERI. Unclear what currently programming is but per clinic note from Dr. Waddell on 09/04, patient pacing in atria but not ventricle.   On the phone, patient able to measure HR with her Apple Watch, rates in the 60s. Denies sound/vibration from device. Denies exposure to magnet. Discussed next steps, need for device interrogation. Her device is not remote transmission compatible so she will need to be seen in clinic. Emergency precautions reviewed with patient.   Artist Pouch, PA-C

## 2023-11-11 NOTE — Telephone Encounter (Signed)
 Contacted Pavan from [employer] to attempt to contact Jamie to arrange time for patients device checked in clinic. Waiting to hear back.

## 2023-11-11 NOTE — Telephone Encounter (Signed)
 Spoke to patient who is agreeable for device clinic apt 11/14/23 @ 8:40 AM. [employer] made aware and agrees to come to apt.

## 2023-11-12 ENCOUNTER — Ambulatory Visit: Admitting: Internal Medicine

## 2023-11-12 NOTE — Progress Notes (Unsigned)
  Electrophysiology Office Note:   ID:  [name], DOB [birth year], MRN [id number]  Primary Cardiologist: None Electrophysiologist: Elspeth Sage, MD *** {Click to update primary MD,subspecialty MD or APP then REFRESH:1}    History of Present Illness:   [name] is a 68 y.o. female with h/o *** seen today for {VISITTYPE:28148}  Review of systems complete and found to be negative unless listed in HPI.   EP Information / Studies Reviewed:    {EKGtoday:28818}       PPM Interrogation-  reviewed in detail today,  See PACEART report.  Arrhythmia/Device History PPM-Biotronik   Physical Exam:   VS:  There were no vitals taken for this visit.   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

## 2023-11-13 ENCOUNTER — Encounter

## 2023-11-13 ENCOUNTER — Encounter: Payer: Self-pay | Admitting: Student

## 2023-11-13 ENCOUNTER — Ambulatory Visit (INDEPENDENT_AMBULATORY_CARE_PROVIDER_SITE_OTHER)

## 2023-11-13 ENCOUNTER — Ambulatory Visit: Attending: Cardiology | Admitting: Student

## 2023-11-13 DIAGNOSIS — R0602 Shortness of breath: Secondary | ICD-10-CM | POA: Diagnosis not present

## 2023-11-13 DIAGNOSIS — I495 Sick sinus syndrome: Secondary | ICD-10-CM | POA: Insufficient documentation

## 2023-11-13 DIAGNOSIS — R002 Palpitations: Secondary | ICD-10-CM | POA: Diagnosis present

## 2023-11-13 LAB — CUP PACEART INCLINIC DEVICE CHECK
Date Time Interrogation Session: 20250917105145
Implantable Lead Connection Status: 753985
Implantable Lead Connection Status: 753985
Implantable Lead Implant Date: 19990303
Implantable Lead Implant Date: 19990303
Implantable Lead Location: 753859
Implantable Lead Location: 753860
Implantable Lead Model: 5092
Implantable Pulse Generator Implant Date: 20120905
Pulse Gen Serial Number: 66195584

## 2023-11-13 NOTE — Patient Instructions (Signed)
 Medication Instructions:  Your physician recommends that you continue on your current medications as directed. Please refer to the Current Medication list given to you today.  *If you need a refill on your cardiac medications before your next appointment, please call your pharmacy*  Lab Work: BMET, MAG, CBC, TSH-TODAY If you have labs (blood work) drawn today and your tests are completely normal, you will receive your results only by: MyChart Message (if you have MyChart) OR A paper copy in the mail If you have any lab test that is abnormal or we need to change your treatment, we will call you to review the results.  Testing/Procedures: GEOFFRY HEWS- Long Term Monitor Instructions  Your physician has requested you wear a ZIO patch monitor for 14 days.  This is a single patch monitor. [employer] supplies one patch monitor per enrollment. Additional stickers are not available. Please do not apply patch if you will be having a Nuclear Stress Test,  Echocardiogram, Cardiac CT, MRI, or Chest Xray during the period you would be wearing the  monitor. The patch cannot be worn during these tests. You cannot remove and re-apply the  ZIO XT patch monitor.  Your ZIO patch monitor will be mailed 3 day USPS to your address on file. It may take 3-5 days  to receive your monitor after you have been enrolled.  Once you have received your monitor, please review the enclosed instructions. Your monitor  has already been registered assigning a specific monitor serial # to you.  Billing and Patient Assistance Program Information  We have supplied [employer] with any of your insurance information on file for billing purposes. [employer] offers a sliding scale Patient Assistance Program for patients that do not have  insurance, or whose insurance does not completely cover the cost of the ZIO monitor.  You must apply for the Patient Assistance Program to qualify for this discounted rate.  To apply, please call [employer] at  [phone], select option 4, select option 2, ask to apply for  Patient Assistance Program. Meredeth will ask your household income, and how many people  are in your household. They will quote your out-of-pocket cost based on that information.  [employer] will also be able to set up a 61-month, interest-free payment plan if needed.  Applying the monitor   Shave hair from upper left chest.  Hold abrader disc by orange tab. Rub abrader in 40 strokes over the upper left chest as  indicated in your monitor instructions.  Clean area with 4 enclosed alcohol pads. Let dry.  Apply patch as indicated in monitor instructions. Patch will be placed under collarbone on left  side of chest with arrow pointing upward.  Rub patch adhesive wings for 2 minutes. Remove white label marked 1. Remove the white  label marked 2. Rub patch adhesive wings for 2 additional minutes.  While looking in a mirror, press and release button in center of patch. A small green light will  flash 3-4 times. This will be your only indicator that the monitor has been turned on.  Do not shower for the first 24 hours. You may shower after the first 24 hours.  Press the button if you feel a symptom. You will hear a small click. Record Date, Time and  Symptom in the Patient Logbook.  When you are ready to remove the patch, follow instructions on the last 2 pages of Patient  Logbook. Stick patch monitor onto the last page of Patient Logbook.  Place Patient Logbook  in the blue and white box. Use locking tab on box and tape box closed  securely. The blue and white box has prepaid postage on it. Please place it in the mailbox as  soon as possible. Your physician should have your test results approximately 7 days after the  monitor has been mailed back to [facility].  Call [facility] Customer Care at [phone] if you have questions regarding  your ZIO XT patch monitor. Call them immediately if you see an orange light  blinking on your  monitor.  If your monitor falls off in less than 4 days, contact our Monitor department at [phone].  If your monitor becomes loose or falls off after 4 days call [employer] at [phone] for  suggestions on securing your monitor   Follow-Up: At [facility], you and your health needs are our priority.  As part of our continuing mission to provide you with exceptional heart care, our providers are all part of one team.  This team includes your primary Cardiologist (physician) and Advanced Practice Providers or APPs (Physician Assistants and Nurse Practitioners) who all work together to provide you with the care you need, when you need it.  Your next appointment:   January 2026  Provider:   You will see one of the following Advanced Practice Providers on your designated Care Team:   Charlies Arthur, [place] Ozell Jodie Passey, PA-C Suzann Riddle, NP Daphne Barrack, NP Artist Pouch, PA-C

## 2023-11-13 NOTE — Progress Notes (Unsigned)
 Enrolled for [employer] to mail a ZIO XT long term holter monitor to the patients address on file.   EP to read

## 2023-11-14 ENCOUNTER — Encounter

## 2023-11-14 ENCOUNTER — Ambulatory Visit: Payer: Self-pay | Admitting: Student

## 2023-11-14 LAB — BASIC METABOLIC PANEL WITH GFR
BUN/Creatinine Ratio: 14 (ref 12–28)
BUN: 12 mg/dL (ref 8–27)
CO2: 21 mmol/L (ref 20–29)
Calcium: 9.9 mg/dL (ref 8.7–10.3)
Chloride: 101 mmol/L (ref 96–106)
Creatinine, Ser: 0.84 mg/dL (ref 0.57–1.00)
Glucose: 82 mg/dL (ref 70–99)
Potassium: 4.4 mmol/L (ref 3.5–5.2)
Sodium: 141 mmol/L (ref 134–144)
eGFR: 77 mL/min/1.73 (ref >59)

## 2023-11-14 LAB — CBC
Hematocrit: 40.7 % (ref 34.0–46.6)
Hemoglobin: 13.3 g/dL (ref 11.1–15.9)
MCH: 29 pg (ref 26.6–33.0)
MCHC: 32.7 g/dL (ref 31.5–35.7)
MCV: 89 fL (ref 79–97)
Platelets: 337 x10E3/uL (ref 150–450)
RBC: 4.59 x10E6/uL (ref 3.77–5.28)
RDW: 12.5 % (ref 11.7–15.4)
WBC: 6.8 x10E3/uL (ref 3.4–10.8)

## 2023-11-14 LAB — MAGNESIUM: Magnesium: 2 mg/dL (ref 1.6–2.3)

## 2023-11-14 LAB — TSH: TSH: 1.23 u[IU]/mL (ref 0.450–4.500)

## 2023-11-21 ENCOUNTER — Ambulatory Visit (INDEPENDENT_AMBULATORY_CARE_PROVIDER_SITE_OTHER): Admitting: Internal Medicine

## 2023-11-21 ENCOUNTER — Encounter: Payer: Self-pay | Admitting: Internal Medicine

## 2023-11-21 VITALS — BP 122/74 | HR 63 | Temp 98.5°F | Ht 63.0 in | Wt 140.8 lb

## 2023-11-21 DIAGNOSIS — Z96641 Presence of right artificial hip joint: Secondary | ICD-10-CM

## 2023-11-21 DIAGNOSIS — Z85820 Personal history of malignant melanoma of skin: Secondary | ICD-10-CM

## 2023-11-21 DIAGNOSIS — G90A Postural orthostatic tachycardia syndrome (POTS): Secondary | ICD-10-CM | POA: Diagnosis not present

## 2023-11-21 DIAGNOSIS — F419 Anxiety disorder, unspecified: Secondary | ICD-10-CM

## 2023-11-21 DIAGNOSIS — Z79899 Other long term (current) drug therapy: Secondary | ICD-10-CM | POA: Diagnosis not present

## 2023-11-21 DIAGNOSIS — G901 Familial dysautonomia [Riley-Day]: Secondary | ICD-10-CM

## 2023-11-21 MED ORDER — CLONAZEPAM 1 MG PO TABS: ORAL_TABLET | ORAL | 2 refills | Status: AC

## 2023-11-21 NOTE — Patient Instructions (Addendum)
 Good to see you today . No change in  meds  for now.   Refilled clonezepam .

## 2023-11-21 NOTE — Progress Notes (Unsigned)
 Chief Complaint  Patient presents with   Follow-up    4 month follow  Refill needed for Clonazepam      HPI: Kelli L Pehrson 68 y.o. come in for Chronic disease management    Had to change  electrophysiology  .       Pacer  effect from mri and  palpitations   pacemaker wasn't working  during this time.   Raynauds :   lopressor    in summer  for raynauds ok   labetolol ROS: See pertinent positives and negatives per HPI.   Past Medical History:  Diagnosis Date   Anemia    Angio-edema    Anxiety    Arthritis    Asthma    Atrial fibrillation (HCC)    Atrial tachycardia    Autonomic dysfunction    Complication of anesthesia    pt states wakes up with shakes   CVA (cerebral infarction)    2012 with dizziness and vision change felt embolic from atrial tachy   Disorder of bone and cartilage, unspecified    Diverticulosis    DM (diabetes mellitus) (HCC)    DVT (deep venous thrombosis) (HCC)    Eczema    Family history of anesthesia complication    PONV and  shaking    Fatty liver    GERD (gastroesophageal reflux disease)    History of hiatal hernia    History of radiation therapy 10/24/2012   brain   HLD (hyperlipidemia)    HTN (hypertension)    Hypoglycemia, unspecified    Liver metastasis    Lung metastasis    Metastasis to brain (HCC)    Osteopenia    Other nonspecific abnormal serum enzyme levels    Other specified congenital anomalies of nervous system    Pacemaker    autonomic dysfunction   Pancreatitis    from therapy   Peripheral vascular disease    POTS (postural orthostatic tachycardia syndrome)    Raynaud's disease    due to chemo   Skin cancer    Hx: of lung lesion    Family History  Problem Relation Age of Onset   Allergic rhinitis Sister    Stroke Mother    Colon polyps Mother    Colon cancer Mother    Urticaria Mother    Allergic rhinitis Mother    Heart disease Father    Diabetes Father    Kidney disease Father    Diabetes  Maternal Grandmother    Clotting disorder Maternal Grandmother        stroke   Crohn's disease Maternal Grandmother    Diabetes Paternal Grandmother    Aneurysm Sister        brain    Social History   Socioeconomic History   Marital status: Married    Spouse name: Not on file   Number of children: 0   Years of education: Not on file   Highest education level: Not on file  Occupational History   Occupation: PRESIDENT    Employer: [employer]    Comment: self employed  Tobacco Use   Smoking status: Never   Smokeless tobacco: Never  Vaping Use   Vaping status: Never Used  Substance and Sexual Activity   Alcohol use: Never    Comment: glass of wine daily   Drug use: No   Sexual activity: Not on file  Other Topics Concern   Not on file  Social History Narrative   4 years college hh of  2    Neg tad   Social Drivers of Health   Financial Resource Strain: Low Risk  (11/22/2021)   Overall Financial Resource Strain (CARDIA)    Difficulty of Paying Living Expenses: Not very hard  Food Insecurity: No Food Insecurity (06/08/2022)   Hunger Vital Sign    Worried About Running Out of Food in the Last Year: Never true    Ran Out of Food in the Last Year: Never true  Transportation Needs: No Transportation Needs (05/18/2022)   PRAPARE - Administrator, Civil Service (Medical): No    Lack of Transportation (Non-Medical): No  Physical Activity: Sufficiently Active (03/14/2021)   Exercise Vital Sign    Days of Exercise per Week: 4 days    Minutes of Exercise per Session: 90 min  Stress: No Stress Concern Present (03/14/2021)   [employer] of Occupational Health - Occupational Stress Questionnaire    Feeling of Stress : Not at all  Recent Concern: Stress - Stress Concern Present (12/26/2020)   [employer] of Occupational Health - Occupational Stress Questionnaire    Feeling of Stress : To some extent  Social Connections: Socially Integrated (03/14/2021)    Social Connection and Isolation Panel    Frequency of Communication with Friends and Family: More than three times a week    Frequency of Social Gatherings with Friends and Family: More than three times a week    Attends Religious Services: More than 4 times per year    Active Member of Clubs or Organizations: Yes    Attends Engineer, structural: More than 4 times per year    Marital Status: Married    Outpatient Medications Prior to Visit  Medication Sig Dispense Refill   acetaminophen  (TYLENOL ) 500 MG tablet Take 500 mg by mouth See admin instructions. Take 500 mg by mouth at bedtime and an additional 500 mg three times a day as needed for pain     albuterol  (PROVENTIL ) (2.5 MG/3ML) 0.083% nebulizer solution Take 3 mLs (2.5 mg total) by nebulization every 4 (four) hours as needed for wheezing or shortness of breath. 180 mL 1   albuterol  (VENTOLIN  HFA) 108 (90 Base) MCG/ACT inhaler Inhale 2 puffs into the lungs every 4 (four) hours as needed for wheezing or shortness of breath. 1 each 0   Ascorbic Acid  (VITAMIN C ) 1000 MG tablet Take 2,000 mg by mouth daily.     augmented betamethasone  dipropionate (DIPROLENE ) 0.05 % ointment Apply topically 2 (two) times daily. Limit to no longer than 2 weeks use. Not on face. 50 g 0   budesonide  (ENTOCORT EC ) 3 MG 24 hr capsule Take 3 mg by mouth as needed for rash.     calcium  carbonate (TUMS EX) 750 MG chewable tablet Chew 1 tablet by mouth daily.     clobetasol cream (TEMOVATE) 0.05 % Apply 1 application topically 2 (two) times daily as needed (rash).     clonazePAM  (KLONOPIN ) 1 MG tablet TAKE ONE TABLET BY MOUTH 2-3 TIMES A DAY AS NEEDED 90 tablet 2   cloNIDine  (CATAPRES ) 0.1 MG tablet TAKE 1 TABLET BY MOUTH EVERY NIGHT AT BEDTIME 90 tablet 2   cyanocobalamin  (VITAMIN B12) 1000 MCG/ML injection INJECT 1ML INTRAMUSCULARLY EVERY 30 DAYS 3 mL 4   Desoximetasone  (TOPICORT ) 0.25 % ointment Apply 1 application topically 2 (two) times daily. 30 g 1    diphenhydrAMINE  (BENADRYL ) 25 MG tablet Take 25-50 mg by mouth daily as needed for allergies.  Dupilumab (DUPIXENT) 300 MG/2ML SOAJ Inject into the skin.     EPINEPHrine  0.3 mg/0.3 mL IJ SOAJ injection Inject 0.3 mg into the muscle as needed for anaphylaxis.     fexofenadine (ALLEGRA) 180 MG tablet Take 180 mg by mouth daily as needed for allergies or rhinitis.     Fluocinonide 0.1 % CREA Apply 1 application  topically 2 (two) times daily as needed. On affected areas     fluticasone  (FLONASE ) 50 MCG/ACT nasal spray Place 2 sprays into both nostrils daily. 16 g 0   furosemide  (LASIX ) 20 MG tablet TAKE 1 TABLET BY MOUTH DAILY AS NEEDED 90 tablet 3   HYDROcodone -acetaminophen  (NORCO/VICODIN) 5-325 MG tablet Take 1-2 tablets by mouth every 6 (six) hours as needed for moderate pain. 30 tablet 0   hydrocortisone  (ANUSOL -HC) 25 MG suppository Place 25 mg rectally as needed for hemorrhoids.     ibuprofen  (ADVIL ) 800 MG tablet Take 1 tablet (800 mg total) by mouth every 8 (eight) hours as needed. 60 tablet 3   KLOR-CON  M20 20 MEQ tablet TAKE 1 TABLET BY MOUTH 2 TIMES A DAY ON DAYS WHEN TAKING LASIX  60 tablet 3   labetalol  (NORMODYNE ) 100 MG tablet Take 0.5 tablets (50 mg total) by mouth 2 (two) times daily. As needed 60 tablet 5   levETIRAcetam  (KEPPRA ) 250 MG tablet Take 250 mg by mouth See admin instructions. Take 250 mg by mouth at 8 AM, 2 PM, and 8 PM     levocetirizine (XYZAL ) 5 MG tablet TAKE 1 TABLET BY MOUTH EVERY DAY AS NEEDED 30 tablet 0   lidocaine  (XYLOCAINE ) 2 % solution 5mL swish and swallow as needed for sore throat; take with nystatin  susp 200 mL 0   methocarbamol  (ROBAXIN ) 500 MG tablet Take 1 tablet (500 mg total) by mouth every 6 (six) hours as needed for muscle spasms. 40 tablet 1   metoprolol  tartrate (LOPRESSOR ) 25 MG tablet Take 1 tablet (25 mg total) by mouth 2 (two) times daily. 180 tablet 3   nystatin  (MYCOSTATIN ) 100000 UNIT/ML suspension Use as directed 3 mLs (300,000 Units  total) in the mouth or throat 3 (three) times daily as needed (thrush). 60 mL 4   Olopatadine  HCl (PATADAY ) 0.2 % SOLN Use one drop in each eye once daily as needed. 2.5 mL 5   ondansetron  (ZOFRAN -ODT) 4 MG disintegrating tablet Take 1-2 tablets (4-8 mg total) by mouth every 8 (eight) hours as needed. 20 tablet 0   polyethylene glycol (MIRALAX  / GLYCOLAX ) packet Take 17 g by mouth at bedtime.     Probiotic Product (PROBIOTIC PO) Take 2 capsules by mouth daily.     prochlorperazine  (COMPAZINE ) 10 MG tablet Take 1 tablet (10 mg total) by mouth every 6 (six) hours as needed for nausea or vomiting. 60 tablet 0   sodium chloride  (OCEAN) 0.65 % SOLN nasal spray Place 1 spray into both nostrils as needed.     Spacer/Aero-Holding Chambers [facility] DIAMOND) MISC See admin instructions. PRN     Syringe/Needle, Disp, (SYRINGE 3CC/27GX1-1/4) 27G X 1-1/4 3 ML MISC Inject 1 mL into the muscle every 30 (thirty) days. Vit B12 12 each 0   tiZANidine  (ZANAFLEX ) 2 MG tablet Take 1 tablet (2 mg total) by mouth every 6 (six) hours as needed for muscle spasms. 30 tablet 0   Vitamin D , Ergocalciferol , (DRISDOL ) 50000 units CAPS capsule TAKE 1 CAPSULE (50,000 UNITS TOTAL) BY MOUTH EVERY 7 (SEVEN) DAYS. 4 capsule 11   Zinc  50 MG TABS  Take 50 mg by mouth daily.     Facility-Administered Medications Prior to Visit  Medication Dose Route Frequency Provider Last Rate Last Admin   mupirocin  ointment (BACTROBAN ) 2 %   Topical Daily Vernetta Lonni GRADE, MD         EXAM:  BP 122/74 (BP Location: Left Arm, Patient Position: Sitting, Cuff Size: Normal)   Pulse 63   Temp 98.5 F (36.9 C) (Oral)   Ht 5' 3 (1.6 m)   Wt 140 lb 12.8 oz (63.9 kg)   SpO2 99%   BMI 24.94 kg/m   Body mass index is 24.94 kg/m.  GENERAL: vitals reviewed and listed above, alert, oriented, appears well hydrated and in no acute distress HEENT: atraumatic, conjunctiva  clear, no obvious abnormalities on inspection of external nose and  ears OP : no lesion edema or exudate  NECK: no obvious masses on inspection palpation  LUNGS: clear to auscultation bilaterally, no wheezes, rales or rhonchi, good air movement CV: HRRR, no clubbing cyanosis or  peripheral edema nl cap refill  MS: moves all extremities without noticeable focal  abnormality PSYCH: pleasant and cooperative, no obvious depression or anxiety Lab Results  Component Value Date   WBC 6.8 11/13/2023   HGB 13.3 11/13/2023   HCT 40.7 11/13/2023   PLT 337 11/13/2023   GLUCOSE 82 11/13/2023   CHOL 220 (H) 07/07/2021   TRIG 179 (H) 07/07/2021   HDL 55 07/07/2021   LDLDIRECT 129 (H) 07/07/2021   LDLCALC 133 (H) 07/07/2021   ALT 18 10/11/2022   AST 23 10/11/2022   NA 141 11/13/2023   K 4.4 11/13/2023   CL 101 11/13/2023   CREATININE 0.84 11/13/2023   BUN 12 11/13/2023   CO2 21 11/13/2023   TSH 1.230 11/13/2023   INR 1.03 08/28/2013   HGBA1C 5.5 06/27/2023   BP Readings from Last 3 Encounters:  11/21/23 122/74  10/31/23 (!) 142/78  09/27/23 127/76    ASSESSMENT AND PLAN:  Discussed the following assessment and plan:  No diagnosis found.  -Patient advised to return or notify health care team  if  new concerns arise.  There are no Patient Instructions on file for this visit.   Jonny Dearden K. Elyan Vanwieren M.D.

## 2023-12-15 DIAGNOSIS — R0602 Shortness of breath: Secondary | ICD-10-CM

## 2023-12-15 DIAGNOSIS — R002 Palpitations: Secondary | ICD-10-CM

## 2023-12-30 ENCOUNTER — Encounter: Payer: Self-pay | Admitting: Radiology

## 2024-01-06 ENCOUNTER — Other Ambulatory Visit

## 2024-01-06 ENCOUNTER — Telehealth: Payer: Self-pay

## 2024-01-06 NOTE — Progress Notes (Signed)
   01/06/2024  Patient ID: [name], female   DOB: 1955-08-21, 68 y.o.   MRN: [id number]  Attempted to contact patient for scheduled appointment for medication management. Left HIPAA compliant message for patient to return my call at their convenience.   Jon VEAR Lindau, PharmD Clinical Pharmacist [phone]

## 2024-01-27 ENCOUNTER — Telehealth: Payer: Self-pay | Admitting: *Deleted

## 2024-01-27 NOTE — Telephone Encounter (Signed)
 Copied from CRM #[id number]. Topic: Clinical - Medication Question >> Jan 27, 2024  3:13 PM Robinson H wrote: Reason for CRM: Patient is calling to see if Dr. Charlett will refill her cloNIDine  (CATAPRES ) 0.1 MG tablet, states that her Cardiologist is retired and she doesn't have anymore refills. Patient states that Dr. Charlett has refilled medication in the past but Cardiologist prescribed and hasn't established with new Cardiologist in process. Patient only has 2 pills left. Patient using [facility]  [place] [phone]

## 2024-01-28 ENCOUNTER — Telehealth: Payer: Self-pay | Admitting: Student in an Organized Health Care Education/Training Program

## 2024-01-28 ENCOUNTER — Telehealth: Payer: Self-pay | Admitting: *Deleted

## 2024-01-28 NOTE — Telephone Encounter (Signed)
 Copied from CRM #[id number]. Topic: Clinical - Medication Question >> Jan 27, 2024  3:13 PM Robinson H wrote: Reason for CRM: Patient is calling to see if Dr. Charlett will refill her cloNIDine  (CATAPRES ) 0.1 MG tablet, states that her Cardiologist is retired and she doesn't have anymore refills. Patient states that Dr. Charlett has refilled medication in the past but Cardiologist prescribed and hasn't established with new Cardiologist in process. Patient only has 2 pills left. Patient using [facility]  [name] [phone] >> Jan 28, 2024  2:50 PM Alfonso ORN wrote: Pt called back to f/u on rx request. Contacted Cardiologist office ([place]) who advised they will have the rx sent high priority by covering physician. >> Jan 28, 2024  2:46 PM Alfonso ORN wrote: Pt called to f/u on request

## 2024-01-28 NOTE — Telephone Encounter (Signed)
*  STAT* If patient is at the pharmacy, call can be transferred to refill team.   1. Which medications need to be refilled? (please list name of each medication and dose if known) cloNIDine  (CATAPRES ) 0.1 MG tablet    4. Which pharmacy/location (including street and city if local pharmacy) is medication to be sent to?  [id number] - [place], [place] - [address]     5. Do they need a 30 day or 90 day supply? 90    Pt states she is out of meds

## 2024-01-29 ENCOUNTER — Other Ambulatory Visit: Payer: Self-pay

## 2024-01-29 MED ORDER — CLONIDINE HCL 0.1 MG PO TABS: 0.1000 mg | ORAL_TABLET | Freq: Every day | ORAL | 0 refills | Status: DC

## 2024-01-29 MED ORDER — CLONIDINE HCL 0.1 MG PO TABS: 0.1000 mg | ORAL_TABLET | Freq: Every day | ORAL | 0 refills | Status: AC

## 2024-01-29 NOTE — Telephone Encounter (Signed)
 Follow up with pt. Inform pt that 90 days Rx is sent to her pharmacy. Pt verbalized understanding. Pt reports when she establish care with cardiologist in [facility], she will let Dr. Charlett know. In the meantime, she said Dr. Charlett agreed to do her refill. Pt states she has appt on 03/18/2023 with Cardiology for pace maker.   Pt is requesting a patient advocate or social worker to help her keep up with medication refills as she has issues with getting refills on time. Advise pt she can send us  mychart message a week ahead so we can address it. Pt states she have done that and her medication would get rejected. Pt continues she needs advocate for navigating medical system? also for medical management. She said she is afraid of [place] care,  not our office.   Please advise on the referral.

## 2024-01-29 NOTE — Telephone Encounter (Signed)
 Not sure  why happening    dont think sw refer will help with this , may be a time processing issue with  med refills request and  because of change of prescribers etc. During  transitions. I am fine with helping in rx for bridging therapy on most meds if not CS * No surgery found * high risk .

## 2024-01-29 NOTE — Telephone Encounter (Signed)
 Then give her 90 days

## 2024-01-29 NOTE — Telephone Encounter (Signed)
 Patient called to follow-up on getting her cloNIDine  (CATAPRES ) 0.1 MG tablet medication refilled.  Patient stated she is completely out of this medication.  Patient has an appointment scheduled with Dr. Almetta on 1/20.  Patient wants a call back to confirm prescription was sent to [id number] - [place], [place] - [address]

## 2024-01-29 NOTE — Telephone Encounter (Signed)
 Please see other encounter.   Per Dr. Charlett- okay to fill a month supply for bridge   Rx sent. Attempt to reach pt. Left a voicemail that Rx is sent.

## 2024-01-29 NOTE — Telephone Encounter (Signed)
 See other message  give her 90 days to get her through

## 2024-01-29 NOTE — Telephone Encounter (Signed)
 Pt called and stated that she needs a 90 day supply vs the 30; let pt know that per Panosh this is a bridge therapy refill until she is able to find a new Cardiologist.   Pt stated that she doesn't see her new Cardiologist until the end of January so the 30 day supply will not last.   Please advise.

## 2024-01-29 NOTE — Telephone Encounter (Signed)
 Copied from CRM [id number]. Topic: General - Call Back - No Documentation >> Jan 29, 2024 10:59 AM Tysheama G wrote: Reason for CRM: Patient is returning call. Advised her Willeen is busy with patients at the moment then advised her again on the msg she left about prescription being sent in. She stated it's supposed to be for a 90 day supply for insurance purposes and not a 30 day supply. And she stated she is also still waiting on a call back from office. And she also stated she needs help with prescriptions and dealing with challenges. >> Jan 29, 2024 11:22 AM Terri MATSU wrote: Had patient on hold and when I came back she wasn't responding. I called her back and left a VM about PA WILL call her back sometime today!!

## 2024-01-29 NOTE — Telephone Encounter (Addendum)
 Spoke with patient in reference to prior concern. She stated she spoke with Dr. Charlett about taking over her medication due to Cardiologist being out on leave due to illness. She would like to know if she is able to have a 90 day supply verses a  30 day supply of cloNIDine  (CATAPRES ) 0.1 MG tablet.   Also patient would like to know if a case management referral could be put in to help her navigate her healthcare.

## 2024-01-29 NOTE — Telephone Encounter (Signed)
 Returned call to pt.  Left her a detailed message that looks like that was sent to Dr. Lynne office and they have already sent in a refill for Clonidine .

## 2024-01-29 NOTE — Telephone Encounter (Signed)
 Pt called in very upset that her meds were not refilled after promised that they will be filled yesterday. Let pt know that the Cardiologist office stated that they would refill it and I apologize to her that it was not taken care of and recommended to transfer her over to that office to see why the rx was not sent.   Pt then got upset stating that we were the ones that promised that it would be taken care of, let pt know that we have a 3 day turn around for refills and that her rx was sent on Monday when [facility] was off. Let pt know that the Cardiologist office promised to fill it yesterday not [place].   Pt kept going back and forth talking in circles concerning her refill not being filled yesterday after being promised. Let pt know that yesterday her refill request was sent high priority to [facility] and it was sent high priority again today; I also let her know that I spoke with the CMA and she is putting it on [facility] radar as soon as she finishes up with a pt. Stated in the meantime I could transfer her over to Cardiologist to see if they can send the rx sooner.   Pt stated no what I could do and I quote Since you are the Practice Admin there then you can get [facility] to send in the the refill right now or no later than 12pm that is my cut off time. At this time I let pt know that I was not going to go back and forth with her. I reassured pt that we have done all that we could to make [facility] aware of the situation; but I needed to disconnect the call because I had pts in front of me and two calls coming in. Pt then stated that I didn't care about her health and I let pt know that I have been on the call with her for 21 mins and tried to get her to Cardiologist since they promised the refill sooner but pt wanted me to force [facility] hand to send the refill. Pt stated that she didn't like the policy nor the call center because she could be talking to her neighbor giving them all her info and that she would call  [facility] if need be.   I politely disconnected the call and let pt know that her refill request is on [facility] radar. Pt stated to her husband that she will have him as witness that I stated that she will not be getting her refill done today. I let her know that [facility] is aware that she needs a refill and that I never said that she won't get it done just that she will get it in the order that it was received. I then disconnected the call after letting her know that I was letting her go.

## 2024-01-29 NOTE — Telephone Encounter (Signed)
 Patient called in to follow up on this medication being filled for her. Patient stated that she is out of this medication and was advised two days ago that this medication will be filled for her by Dr Charlett.

## 2024-01-29 NOTE — Telephone Encounter (Signed)
 Patient would like a follow up call from the practice admin regarding a previous conversation she had with PCP at her last OV on 9/25 stating she would take over patient cloNIDine  (CATAPRES ) 0.1 MG tablet., until she transitions to a new cardiologist in [place].    Patient states she spoke with PCP nurse and Dr. Waddell cardiologist nurse on [address] way yesterday and was advised medication would be filled yesterday. Patient was transferred to [facility] to check on the status of mediation refill. Patient states she has a [facility] cardiologist appointment scheduled for 12/29. While on the phone with the patient she received a call stating PCP refilled medication. Patient was advised.   Patient would like to speak with practice admin today regarding if PCP would be taking care of her medication until she gets established with her new cardiologist and would like a definitive answer.

## 2024-02-05 NOTE — Addendum Note (Signed)
 Addended by: Gay Moncivais on: 02/05/2024 09:50 AM   Modules accepted: Orders

## 2024-02-10 ENCOUNTER — Telehealth: Payer: Self-pay | Admitting: *Deleted

## 2024-02-10 NOTE — Progress Notes (Signed)
 Care Guide Pharmacy Note  02/10/2024 Name: [name] MRN: [id number] DOB: 09-24-1955  Referred By: Charlett Apolinar POUR, MD Reason for referral: Call Attempt #1 and Complex Care Management (Outreach to schedule referral with pharmacist )   [name] is a 68 y.o. year old female who is a primary care patient of Panosh, Apolinar POUR, MD.  [name] was referred to the pharmacist for assistance related to: DMII  Successful contact was made with the patient to discuss pharmacy services including being ready for the pharmacist to call at least 5 minutes before the scheduled appointment time and to have medication bottles and any blood pressure readings ready for review. The patient agreed to meet with the pharmacist via telephone visit on 02/24/2024  Thedford Franks, CMA [place]  [facility], [facility] Guide Direct Dial: [phone]  Fax: [phone] Website: [email or web address]

## 2024-02-24 ENCOUNTER — Other Ambulatory Visit

## 2024-02-24 DIAGNOSIS — E119 Type 2 diabetes mellitus without complications: Secondary | ICD-10-CM

## 2024-02-24 NOTE — Progress Notes (Signed)
 "  02/24/2024 Name: [name] MRN: [id number] DOB: Jun 01, 1955  Chief Complaint  Patient presents with   Medication Management    [name] is a 68 y.o. year old female who presented for a telephone visit.   They were referred to the pharmacist by their PCP for assistance in managing complex medication management.    Subjective:  Care Team: Primary Care Provider: Charlett Apolinar POUR, MD   Medication Access/Adherence  Current Pharmacy:  [id number] - [place], [place] - [address] [place] [address] Phone: [phone] Fax: [phone]  [id number] - [place], [name] - [name] [place] [address] Phone: [phone] Fax: [phone]  [place] [facility] [address] [name] [address] Phone: [phone] Fax: [phone]  [facility] [place] [facility] [address] [name] [address] Phone: [phone] Fax: [phone]   Patient reports affordability concerns with their medications: No  Patient reports access/transportation concerns to their pharmacy: No  Patient reports adherence concerns with their medications:  No     Medication Management:  Current adherence strategy: Keeps her medication in a chest in alphabetical order. Does not want to use a daily pill organizer due to so many prn meds  Patient reports Good adherence to medications  Patient wants to ensure there are no medication gaps, in particular with regard to cardio meds as she is in middle of transitioning to new cardio office at end of January.  Patient also makes note of being out of prn zofran , wondering if PCP willing to prescribe  Objective:  Lab Results  Component Value Date   HGBA1C 5.5 06/27/2023    Lab Results  Component Value Date   CREATININE 0.84 11/13/2023   BUN 12 11/13/2023   NA 141 11/13/2023    K 4.4 11/13/2023   CL 101 11/13/2023   CO2 21 11/13/2023    Lab Results  Component Value Date   CHOL 220 (H) 07/07/2021   HDL 55 07/07/2021   LDLCALC 133 (H) 07/07/2021   LDLDIRECT 129 (H) 07/07/2021   TRIG 179 (H) 07/07/2021   CHOLHDL 4.0 07/07/2021    Medications Reviewed Today     Reviewed by [name], RPH (Pharmacist) on 02/24/24 at 1338  Med List Status: <None>   Medication Order Taking? Sig Documenting Provider Last Dose Status Informant  acetaminophen  (TYLENOL ) 500 MG tablet [id number] Yes Take 500 mg by mouth See admin instructions. Take 500 mg by mouth at bedtime and an additional 500 mg three times a day as needed for pain [provider]  Active Self           Med Note (MENDOZA MENDEZ, CARLOS A   Tue May 31, 2020  2:15 PM)    albuterol  (PROVENTIL ) (2.5 MG/3ML) 0.083% nebulizer solution [id number] Yes Take 3 mLs (2.5 mg total) by nebulization every 4 (four) hours as needed for wheezing or shortness of breath. [name], MD  Active   albuterol  (VENTOLIN  HFA) [phone] Base) MCG/ACT inhaler [id number] Yes Inhale 2 puffs into the lungs every 4 (four) hours as needed for wheezing or shortness of breath. [name], MD  Active   Ascorbic Acid  (VITAMIN C ) 1000 MG tablet [id number] Yes Take 2,000 mg by mouth daily. [provider]  Active Self  augmented betamethasone  dipropionate (DIPROLENE ) 0.05 % ointment [id number]  Apply topically 2 (two) times daily. Limit to no longer than 2 weeks use. Not on face. Panosh, Apolinar POUR, MD  Active Self  budesonide  (ENTOCORT EC ) 3 MG 24 hr capsule [id number]  Take 3 mg by mouth as needed for rash.  Patient not taking: Reported on 02/24/2024   [provider]  Active Self           Med Note LUCIO, [facility]   Thu Nov 21, 2023 10:44 AM) Titration by GI practice    calcium  carbonate (TUMS EX) 750 MG chewable tablet [id number] Yes Chew 1 tablet by mouth daily. [provider]  Active Self   clobetasol cream (TEMOVATE) 0.05 % [id number] Yes Apply 1 application topically 2 (two) times daily as needed (rash). [provider]  Active Self  clonazePAM  (KLONOPIN ) 1 MG tablet [id number] Yes TAKE ONE TABLET BY MOUTH 2-3 TIMES A DAY AS NEEDED Panosh, Wanda K, MD  Active   cloNIDine  (CATAPRES ) 0.1 MG tablet [id number] Yes Take 1 tablet (0.1 mg total) by mouth at bedtime. Panosh, Apolinar POUR, MD  Active   cyanocobalamin  (VITAMIN B12) 1000 MCG/ML injection [id number] Yes INJECT 1ML INTRAMUSCULARLY EVERY 30 DAYS Webb, Padonda B, FNP  Active   Desoximetasone  (TOPICORT ) 0.25 % ointment [id number]  Apply 1 application topically 2 (two) times daily. Panosh, Apolinar POUR, MD  Active Self    Discontinued 02/24/24 1310 (Patient Preference) Dupilumab (DUPIXENT) 300 MG/2ML SOAJ [id number] Yes Inject into the skin. [provider]  Active            Med Note [name], Verla Bryngelson H   Tue Mar 05, 2023  1:20 PM) Every other week. Not started yet, pending insurance approval  EPINEPHrine  0.3 mg/0.3 mL IJ SOAJ injection [id number] Yes Inject 0.3 mg into the muscle as needed for anaphylaxis. [provider]  Active Self  fexofenadine (ALLEGRA) 180 MG tablet [id number] Yes Take 180 mg by mouth daily as needed for allergies or rhinitis. [provider]  Active Self  Fluocinonide 0.1 % CREA [id number] Yes Apply 1 application  topically 2 (two) times daily as needed. On affected areas [provider]  Active   fluticasone  (FLONASE ) 50 MCG/ACT nasal spray [id number]  Place 2 sprays into both nostrils daily. Leath-WarrenEtta PARAS, NP  Active Self  furosemide  (LASIX ) 20 MG tablet [id number] Yes TAKE 1 TABLET BY MOUTH DAILY AS NEEDED Fernande Elspeth BROCKS, MD  Active   HYDROcodone -acetaminophen  (NORCO/VICODIN) 5-325 MG tablet [id number]  Take 1-2 tablets by mouth every 6 (six) hours as needed for moderate pain. Vernetta Lonni GRADE, MD  Active   hydrocortisone  (ANUSOL -HC) 25 MG suppository [id number]   Place 25 mg rectally as needed for hemorrhoids. [provider]  Active Self  ibuprofen  (ADVIL ) 800 MG tablet [id number] Yes Take 1 tablet (800 mg total) by mouth every 8 (eight) hours as needed. Vernetta Lonni GRADE, MD  Active   KLOR-CON  M20 20 MEQ tablet [id number] Yes TAKE 1 TABLET BY MOUTH 2 TIMES A DAY ON DAYS WHEN TAKING LASIX  Panosh, Apolinar POUR, MD  Active   labetalol  (NORMODYNE ) 100 MG tablet [id number] Yes Take 0.5 tablets (50 mg total) by mouth 2 (two) times daily. As needed Cindie Ole DASEN, MD  Active   levETIRAcetam  (KEPPRA ) 250 MG tablet [id number] Yes Take 250 mg by mouth See admin instructions. Take 250 mg by mouth at 8 AM, 2 PM, and 8 PM [provider]  Active Self           Med  Note ELSWORTH ARLYNE CROME   Tue Jun 27, 2021  3:16 PM)      Discontinued 02/24/24 1314 (Patient Preference)   Discontinued 02/24/24 1314 (Completed Course) methocarbamol  (ROBAXIN ) 500 MG tablet [id number] Yes Take 1 tablet (500 mg total) by mouth every 6 (six) hours as needed for muscle spasms. Gretta Bertrum ORN, PA-C  Active   metoprolol  tartrate (LOPRESSOR ) 25 MG tablet [id number]  Take 1 tablet (25 mg total) by mouth 2 (two) times daily. Cindie Ole DASEN, MD  Expired 11/21/23 2359   mupirocin  ointment (BACTROBAN ) 2 % [id number]   Vernetta Lonni GRADE, MD  Active   nystatin  (MYCOSTATIN ) 100000 UNIT/ML suspension [id number] Yes Use as directed 3 mLs (300,000 Units total) in the mouth or throat 3 (three) times daily as needed (thrush). Panosh, Wanda K, MD  Active   Olopatadine  HCl (PATADAY ) 0.2 % SOLN [id number] Yes Use one drop in each eye once daily as needed. Bobbitt, Elgin Pepper, MD  Active Self  ondansetron  (ZOFRAN -ODT) 4 MG disintegrating tablet [id number] Yes Take 1-2 tablets (4-8 mg total) by mouth every 8 (eight) hours as needed. Burnette, Jennifer M, PA-C  Active   polyethylene glycol (MIRALAX  / GLYCOLAX ) packet [id number] Yes Take 17 g by mouth at bedtime. [provider]   Active Self  Probiotic Product (PROBIOTIC PO) [id number] Yes Take 2 capsules by mouth daily. [provider]  Active Self  prochlorperazine  (COMPAZINE ) 10 MG tablet [id number]  Take 1 tablet (10 mg total) by mouth every 6 (six) hours as needed for nausea or vomiting. Johnny Garnette LABOR, MD  Active Self  sodium chloride  (OCEAN) 0.65 % SOLN nasal spray [id number] Yes Place 1 spray into both nostrils as needed. [provider]  Active Self  Spacer/Aero-Holding [facility] DIAMOND) MISC [id number]  See admin instructions. PRN [provider]  Active Self  Syringe/Needle, Disp, (SYRINGE 3CC/27GX1-1/4) 27G X 1-1/4 3 ML MISC [id number]  Inject 1 mL into the muscle every 30 (thirty) days. Vit B12 Panosh, Wanda K, MD  Active Self    Discontinued 02/24/24 1317 (Patient Preference)   Vitamin D , Ergocalciferol , (DRISDOL ) 50000 units CAPS capsule [id number] Yes TAKE 1 CAPSULE (50,000 UNITS TOTAL) BY MOUTH EVERY 7 (SEVEN) DAYS. Fernande Elspeth BROCKS, MD  Active Self  Zinc  50 MG TABS [id number] Yes Take 50 mg by mouth daily. [provider]  Active Self              Assessment/Plan:   Medication Management: - Currently strategy sufficient to maintain appropriate adherence to prescribed medication regimen - Updated myChart list of medication, indication, and administration time.  -Reviewed med list for any potential meds prescribed by former cardiologist that patient will run out of of prior to appt with new cardiologist at end of January. Patient will need 1 refill for 30ds of labetalol , requesting PCP fill it, will send request.       Follow Up Plan: 4 months  Jon VEAR Lindau, PharmD Clinical Pharmacist [phone]  "

## 2024-02-25 ENCOUNTER — Telehealth: Payer: Self-pay

## 2024-02-25 DIAGNOSIS — B9689 Other specified bacterial agents as the cause of diseases classified elsewhere: Secondary | ICD-10-CM

## 2024-02-25 NOTE — Telephone Encounter (Signed)
-----   Message from Jon VEAR Lindau sent at 02/24/2024  1:46 PM EST ----- Had phone call with Ms [name]. She is requesting the following refills if possible:  1) Labetalol  100mg  1/2 tab by mouth BID - previously prescribed by cardio, waiting to establish with new cardio at end of Jan. Will run out prior to visit, requesting a 30 day supply  2) Zofran  ODT 4mg  Take 1-2 tabs prn N/V every 8 hours - last prescribed by virtual telehealth doc in April, now out, likes to have on hand, asking if PCP will renew a small course prn  Pharmacy: [id number] - [place], [place] - [address] P: [phone] F: [phone]

## 2024-02-26 MED ORDER — LABETALOL HCL 100 MG PO TABS: 50.0000 mg | ORAL_TABLET | Freq: Two times a day (BID) | ORAL | 0 refills | Status: DC

## 2024-02-26 MED ORDER — ONDANSETRON 4 MG PO TBDP: 4.0000 mg | ORAL_TABLET | Freq: Three times a day (TID) | ORAL | 0 refills | Status: AC | PRN

## 2024-02-26 NOTE — Telephone Encounter (Signed)
Ok to refill this? 

## 2024-02-26 NOTE — Telephone Encounter (Signed)
 Rx sent. Contacted pt. Pt is aware.

## 2024-02-26 NOTE — Addendum Note (Signed)
 Addended by: Claryssa Sandner on: 02/26/2024 01:10 PM   Modules accepted: Orders

## 2024-03-02 ENCOUNTER — Other Ambulatory Visit: Payer: Self-pay

## 2024-03-02 MED ORDER — CYANOCOBALAMIN 1000 MCG/ML IJ SOLN: 1000.0000 ug | INTRAMUSCULAR | 4 refills | Status: AC

## 2024-03-11 NOTE — Progress Notes (Signed)
 [name] called . She and Mr. Ciotti have been discussing the scope plan. Due to the uncertainty of her actually getting the scan in a timely fashion, she wishes to CANCEL the admission and CANCEL the scope.  She will KEEP the visit with Dr. Malinda and we will pursue setting up the PET scan.

## 2024-03-17 ENCOUNTER — Ambulatory Visit: Admitting: Student in an Organized Health Care Education/Training Program

## 2024-03-18 ENCOUNTER — Ambulatory Visit: Admitting: Internal Medicine

## 2024-03-18 ENCOUNTER — Encounter: Payer: Self-pay | Admitting: Internal Medicine

## 2024-03-18 ENCOUNTER — Telehealth: Admitting: Internal Medicine

## 2024-03-18 VITALS — BP 132/98 | Temp 97.9°F | Wt 128.0 lb

## 2024-03-18 DIAGNOSIS — G90A Postural orthostatic tachycardia syndrome (POTS): Secondary | ICD-10-CM | POA: Diagnosis not present

## 2024-03-18 DIAGNOSIS — G901 Familial dysautonomia [Riley-Day]: Secondary | ICD-10-CM | POA: Diagnosis not present

## 2024-03-18 DIAGNOSIS — Z85820 Personal history of malignant melanoma of skin: Secondary | ICD-10-CM

## 2024-03-18 DIAGNOSIS — Z950 Presence of cardiac pacemaker: Secondary | ICD-10-CM

## 2024-03-18 DIAGNOSIS — K529 Noninfective gastroenteritis and colitis, unspecified: Secondary | ICD-10-CM

## 2024-03-18 DIAGNOSIS — Z79899 Other long term (current) drug therapy: Secondary | ICD-10-CM | POA: Diagnosis not present

## 2024-03-18 DIAGNOSIS — F419 Anxiety disorder, unspecified: Secondary | ICD-10-CM

## 2024-03-18 DIAGNOSIS — R634 Abnormal weight loss: Secondary | ICD-10-CM

## 2024-03-18 NOTE — Progress Notes (Signed)
 " Virtual Visit via Video Note  I connected with [name] on 03/18/24 at  2:00 PM EST by a video enabled telemedicine application and verified that I am speaking with the correct person using two identifiers. Location patient: home Location provider:work office Persons participating in the virtual visit: patient, provider  Patient aware  of the limitations of evaluation and management by telemedicine and  availability of in person appointments. and agreed to proceed.   HPI: [name] presents for video visit to update evaluation for  her significant sx on going. Significant  Weight loss   and on going diarrhea   of unclear etiology . Pet scan ordered to evaluate for causes  ( cancer dx)    but cr has been stable.  Labs  show inc calprotectin   inflammatory poss from immunotherapy .  Get assigning   new cards team . Pacer is mobile but attached and will need  intervention. New   provider  specialists transition   ROS: See pertinent positives and negatives per HPI.  Past Medical History:  Diagnosis Date   Anemia    Angio-edema    Anxiety    Arthritis    Asthma    Atrial fibrillation (HCC)    Atrial tachycardia    Autonomic dysfunction    Complication of anesthesia    pt states wakes up with shakes   CVA (cerebral infarction)    2012 with dizziness and vision change felt embolic from atrial tachy   Disorder of bone and cartilage, unspecified    Diverticulosis    DM (diabetes mellitus) (HCC)    DVT (deep venous thrombosis) (HCC)    Eczema    Family history of anesthesia complication    PONV and  shaking    Fatty liver    GERD (gastroesophageal reflux disease)    History of hiatal hernia    History of radiation therapy 10/24/2012   brain   HLD (hyperlipidemia)    HTN (hypertension)    Hypoglycemia, unspecified    Liver metastasis    Lung metastasis    Metastasis to brain (HCC)    Osteopenia    Other nonspecific abnormal serum enzyme levels    Other  specified congenital anomalies of nervous system    Pacemaker    autonomic dysfunction   Pancreatitis    from therapy   Peripheral vascular disease    POTS (postural orthostatic tachycardia syndrome)    Raynaud's disease    due to chemo   Skin cancer    Hx: of lung lesion    Past Surgical History:  Procedure Laterality Date   ABLATION SAPHENOUS VEIN W/ RFA     2002 x3   CARPAL TUNNEL RELEASE Left 2000   CHOLECYSTECTOMY N/A 03/28/2018   Procedure: LAPAROSCOPIC CHOLECYSTECTOMY WITH INTRAOPERATIVE CHOLANGIOGRAM;  Surgeon: Gladis Cough, MD;  Location: [facility] OR;  Service: General;  Laterality: N/A;   CRANIOTOMY N/A 09/30/2012   suboccipital craniectomy   CRANIOTOMY Left 02/09/2013   Procedure: LEFT PARIETAL CRANIOTOMY with stealth;  Surgeon: Victory Gens, MD;  Location: [facility];  Service: Neurosurgery;  Laterality: Left;  LEFT Parietal Craniotomy for tumor with stealth   DEEP AXILLARY SENTINEL NODE BIOPSY / EXCISION     due to extensive Melanoma-right arm   fatty tumor removed  2000   from chest   INSERT / REPLACE / REMOVE PACEMAKER  2012   1999, x 3   LAPAROSCOPY  09/06/2011   Procedure: LAPAROSCOPY OPERATIVE;  Surgeon:  Kelly A. Kandyce, MD;  Location: [facility] OR[facility];  Service: Gynecology;  Laterality: Left;  with Left Ovarian Cystectomy    MELANOMA EXCISION     with removal of lymph nodes, left shoulder   NOSE SURGERY  2010, 2012   for nose bleeds x 2   SINOSCOPY     TONSILLECTOMY AND ADENOIDECTOMY     TOTAL HIP ARTHROPLASTY Right 05/18/2022   Procedure: RIGHT TOTAL HIP ARTHROPLASTY ANTERIOR APPROACH;  Surgeon: Vernetta Lonni GRADE, MD;  Location: [facility];  Service: Orthopedics;  Laterality: Right;    Family History  Problem Relation Age of Onset   Allergic rhinitis Sister    Stroke Mother    Colon polyps Mother    Colon cancer Mother    Urticaria Mother    Allergic rhinitis Mother    Heart disease Father    Diabetes Father    Kidney disease Father    Diabetes  Maternal Grandmother    Clotting disorder Maternal Grandmother        stroke   Crohn's disease Maternal Grandmother    Diabetes Paternal Grandmother    Aneurysm Sister        brain    Social History[1]   Current Medications[2]  EXAM: BP Readings from Last 3 Encounters:  03/18/24 (!) 132/98  11/21/23 122/74  10/31/23 (!) 142/78    VITALS per patient if applicable:  GENERAL: alert, oriented, appears well and in no acute distress  HEENT: atraumatic, conjunttiva clear, no obvious abnormalities on inspection of external nose and ears  NECK: normal movements of the head and neck  LUNGS: on inspection no signs of respiratory distress, breathing rate appears normal, no obvious gross SOB, gasping or wheezing  PSYCH/NEURO: pleasant and cooperative, no obvious depression or anxiety, speech and thought processing grossly intact Lab Results  Component Value Date   WBC 6.8 11/13/2023   HGB 13.3 11/13/2023   HCT 40.7 11/13/2023   PLT 337 11/13/2023   GLUCOSE 82 11/13/2023   CHOL 220 (H) 07/07/2021   TRIG 179 (H) 07/07/2021   HDL 55 07/07/2021   LDLDIRECT 129 (H) 07/07/2021   LDLCALC 133 (H) 07/07/2021   ALT 18 10/11/2022   AST 23 10/11/2022   NA 141 11/13/2023   K 4.4 11/13/2023   CL 101 11/13/2023   CREATININE 0.84 11/13/2023   BUN 12 11/13/2023   CO2 21 11/13/2023   TSH 1.230 11/13/2023   INR 1.03 08/28/2013   HGBA1C 5.5 06/27/2023    ASSESSMENT AND PLAN:  Discussed the following assessment and plan:    ICD-10-CM   1. Postprandial diarrhea  K52.9     2. Weight loss  R63.4     3. Anxiety  F41.9     4. Medication management  Z79.899     5. Dysautonomia (HCC)  G90.1     6. POTS (postural orthostatic tachycardia syndrome)  G90.A     7. Cardiac pacemaker  Z95.0     8. History of melanoma  Z85.820     Record review  and discussion   Keep us  informed   Ok to continue on clonazepam  at this time  Agree with optimizing meds as possible and fu with [facility]  specialty team. Counseled.   Expectant management and discussion of plan and treatment with opportunity to ask questions and all were answered. The patient agreed with the plan and demonstrated an understanding of the instructions. 32 minutes    Advised to call back or seek an in-person evaluation if worsening  or having  further concerns  in interim. Return for when planned, as indicated.  Apolinar Eastern, MD      [1]  Social History Tobacco Use   Smoking status: Never   Smokeless tobacco: Never  Vaping Use   Vaping status: Never Used  Substance Use Topics   Alcohol use: Never    Comment: glass of wine daily   Drug use: No  [2]  Current Outpatient Medications:    acetaminophen  (TYLENOL ) 500 MG tablet, Take 500 mg by mouth See admin instructions. Take 500 mg by mouth at bedtime and an additional 500 mg three times a day as needed for pain, Disp: , Rfl:    albuterol  (PROVENTIL ) (2.5 MG/3ML) 0.083% nebulizer solution, Take 3 mLs (2.5 mg total) by nebulization every 4 (four) hours as needed for wheezing or shortness of breath., Disp: 180 mL, Rfl: 1   albuterol  (VENTOLIN  HFA) 108 (90 Base) MCG/ACT inhaler, Inhale 2 puffs into the lungs every 4 (four) hours as needed for wheezing or shortness of breath., Disp: 1 each, Rfl: 0   Ascorbic Acid  (VITAMIN C ) 1000 MG tablet, Take 2,000 mg by mouth daily., Disp: , Rfl:    augmented betamethasone  dipropionate (DIPROLENE ) 0.05 % ointment, Apply topically 2 (two) times daily. Limit to no longer than 2 weeks use. Not on face. (Patient taking differently: Apply topically 2 (two) times daily. Limit to no longer than 2 weeks use. Not on face. As needed), Disp: 50 g, Rfl: 0   calcium  carbonate (TUMS EX) 750 MG chewable tablet, Chew 1 tablet by mouth daily., Disp: , Rfl:    clobetasol cream (TEMOVATE) 0.05 %, Apply 1 application topically 2 (two) times daily as needed (rash)., Disp: , Rfl:    clonazePAM  (KLONOPIN ) 1 MG tablet, TAKE ONE TABLET BY MOUTH 2-3  TIMES A DAY AS NEEDED, Disp: 90 tablet, Rfl: 2   cloNIDine  (CATAPRES ) 0.1 MG tablet, Take 1 tablet (0.1 mg total) by mouth at bedtime., Disp: 90 tablet, Rfl: 0   cyanocobalamin  (VITAMIN B12) 1000 MCG/ML injection, Inject 1 mL (1,000 mcg total) into the skin every 30 (thirty) days., Disp: 3 mL, Rfl: 4   Desoximetasone  (TOPICORT ) 0.25 % ointment, Apply 1 application topically 2 (two) times daily. (Patient taking differently: Apply 1 application  topically 2 (two) times daily as needed.), Disp: 30 g, Rfl: 1   Dupilumab (DUPIXENT) 300 MG/2ML SOAJ, Inject into the skin. (Patient taking differently: Inject into the skin. Every other week.), Disp: , Rfl:    EPINEPHrine  0.3 mg/0.3 mL IJ SOAJ injection, Inject 0.3 mg into the muscle as needed for anaphylaxis., Disp: , Rfl:    fexofenadine (ALLEGRA) 180 MG tablet, Take 180 mg by mouth daily as needed for allergies or rhinitis., Disp: , Rfl:    Fluocinonide 0.1 % CREA, Apply 1 application  topically 2 (two) times daily as needed. On affected areas, Disp: , Rfl:    fluticasone  (FLONASE ) 50 MCG/ACT nasal spray, Place 2 sprays into both nostrils daily., Disp: 16 g, Rfl: 0   furosemide  (LASIX ) 20 MG tablet, TAKE 1 TABLET BY MOUTH DAILY AS NEEDED, Disp: 90 tablet, Rfl: 3   HYDROcodone -acetaminophen  (NORCO/VICODIN) 5-325 MG tablet, Take 1-2 tablets by mouth every 6 (six) hours as needed for moderate pain., Disp: 30 tablet, Rfl: 0   hydrocortisone  (ANUSOL -HC) 25 MG suppository, Place 25 mg rectally as needed for hemorrhoids., Disp: , Rfl:    ibuprofen  (ADVIL ) 800 MG tablet, Take 1 tablet (800 mg total) by mouth every 8 (  eight) hours as needed., Disp: 60 tablet, Rfl: 3   KLOR-CON  M20 20 MEQ tablet, TAKE 1 TABLET BY MOUTH 2 TIMES A DAY ON DAYS WHEN TAKING LASIX , Disp: 60 tablet, Rfl: 3   labetalol  (NORMODYNE ) 100 MG tablet, Take 0.5 tablets (50 mg total) by mouth 2 (two) times daily. As needed, Disp: 30 tablet, Rfl: 0   levETIRAcetam  (KEPPRA ) 250 MG tablet, Take 250 mg  by mouth See admin instructions. Take 250 mg by mouth at 8 AM, 2 PM, and 8 PM, Disp: , Rfl:    methocarbamol  (ROBAXIN ) 500 MG tablet, Take 1 tablet (500 mg total) by mouth every 6 (six) hours as needed for muscle spasms., Disp: 40 tablet, Rfl: 1   metoprolol  tartrate (LOPRESSOR ) 25 MG tablet, Take 1 tablet (25 mg total) by mouth 2 (two) times daily., Disp: 180 tablet, Rfl: 3   nystatin  (MYCOSTATIN ) 100000 UNIT/ML suspension, Use as directed 3 mLs (300,000 Units total) in the mouth or throat 3 (three) times daily as needed (thrush)., Disp: 60 mL, Rfl: 4   Olopatadine  HCl (PATADAY ) 0.2 % SOLN, Use one drop in each eye once daily as needed., Disp: 2.5 mL, Rfl: 5   ondansetron  (ZOFRAN -ODT) 4 MG disintegrating tablet, Take 1-2 tablets (4-8 mg total) by mouth every 8 (eight) hours as needed., Disp: 20 tablet, Rfl: 0   polyethylene glycol (MIRALAX  / GLYCOLAX ) packet, Take 17 g by mouth at bedtime., Disp: , Rfl:    Probiotic Product (PROBIOTIC PO), Take 2 capsules by mouth daily., Disp: , Rfl:    prochlorperazine  (COMPAZINE ) 10 MG tablet, Take 1 tablet (10 mg total) by mouth every 6 (six) hours as needed for nausea or vomiting., Disp: 60 tablet, Rfl: 0   sodium chloride  (OCEAN) 0.65 % SOLN nasal spray, Place 1 spray into both nostrils as needed., Disp: , Rfl:    Spacer/Aero-Holding Chambers (OPTICHAMBER DIAMOND) MISC, See admin instructions. PRN, Disp: , Rfl:    Syringe/Needle, Disp, (SYRINGE 3CC/27GX1-1/4) 27G X 1-1/4 3 ML MISC, Inject 1 mL into the muscle every 30 (thirty) days. Vit B12, Disp: 12 each, Rfl: 0   Vitamin D , Ergocalciferol , (DRISDOL ) 50000 units CAPS capsule, TAKE 1 CAPSULE (50,000 UNITS TOTAL) BY MOUTH EVERY 7 (SEVEN) DAYS. (Patient taking differently: Take 50,000 Units by mouth. Every other week.), Disp: 4 capsule, Rfl: 11   Zinc  50 MG TABS, Take 50 mg by mouth daily., Disp: , Rfl:    budesonide  (ENTOCORT EC ) 3 MG 24 hr capsule, Take 3 mg by mouth as needed for rash. (Patient not taking:  Reported on 03/18/2024), Disp: , Rfl:   Current Facility-Administered Medications:    mupirocin  ointment (BACTROBAN ) 2 %, , Topical, Daily, Vernetta Lonni GRADE, MD  "

## 2024-03-20 ENCOUNTER — Encounter: Payer: Self-pay | Admitting: Student in an Organized Health Care Education/Training Program

## 2024-03-21 NOTE — Progress Notes (Unsigned)
" °  Cardiology Office Note   Date:  03/21/2024  ID:  [name], DOB [birth year], MRN [id number] PCP: Charlett Apolinar POUR, MD  [facility] Providers Cardiologist:  None { Click to update primary MD,subspecialty MD or APP then REFRESH:1}    History of Present Illness [name] is a 69 y.o. female ***  ROS: ***  Studies Reviewed      *** Risk Assessment/Calculations {Does this patient have ATRIAL FIBRILLATION?:[phone]} No BP recorded.  {Refresh Note OR Click here to enter BP  :1}***       Physical Exam VS:  There were no vitals taken for this visit.       Wt Readings from Last 3 Encounters:  03/18/24 128 lb (58.1 kg)  11/21/23 140 lb 12.8 oz (63.9 kg)  10/31/23 141 lb (64 kg)    GEN: Well nourished, well developed in no acute distress NECK: No JVD; No carotid bruits CARDIAC: ***RRR, no murmurs, rubs, gallops RESPIRATORY:  Clear to auscultation without rales, wheezing or rhonchi  ABDOMEN: Soft, non-tender, non-distended EXTREMITIES:  No edema; No deformity   ASSESSMENT AND PLAN ***    {Are you ordering a CV Procedure (e.g. stress test, cath, DCCV, TEE, etc)?   Press F2        :[phone]}  Dispo: ***  Signed, Donnice DELENA Primus, MD  "

## 2024-03-26 ENCOUNTER — Ambulatory Visit: Admitting: Physician Assistant

## 2024-03-27 ENCOUNTER — Other Ambulatory Visit: Payer: Self-pay | Admitting: Internal Medicine

## 2024-06-22 ENCOUNTER — Other Ambulatory Visit
